# Patient Record
Sex: Female | Born: 1945 | Race: Black or African American | Hispanic: No | Smoking: Former smoker
Health system: Southern US, Community
[De-identification: ages and names within clinical notes are randomized; demographics above are authoritative.]

## PROBLEM LIST (undated history)

## (undated) DIAGNOSIS — E1142 Type 2 diabetes mellitus with diabetic polyneuropathy: Secondary | ICD-10-CM

## (undated) DIAGNOSIS — R4182 Altered mental status, unspecified: Secondary | ICD-10-CM

## (undated) DIAGNOSIS — D649 Anemia, unspecified: Secondary | ICD-10-CM

## (undated) DIAGNOSIS — D573 Sickle-cell trait: Secondary | ICD-10-CM

## (undated) DIAGNOSIS — R1319 Other dysphagia: Secondary | ICD-10-CM

## (undated) DIAGNOSIS — N189 Chronic kidney disease, unspecified: Secondary | ICD-10-CM

## (undated) DIAGNOSIS — D571 Sickle-cell disease without crisis: Secondary | ICD-10-CM

## (undated) DIAGNOSIS — D561 Beta thalassemia: Secondary | ICD-10-CM

## (undated) DIAGNOSIS — I10 Essential (primary) hypertension: Secondary | ICD-10-CM

## (undated) DIAGNOSIS — E785 Hyperlipidemia, unspecified: Secondary | ICD-10-CM

## (undated) DIAGNOSIS — E639 Nutritional deficiency, unspecified: Secondary | ICD-10-CM

## (undated) HISTORY — DX: Hyperlipidemia, unspecified: E78.5

## (undated) HISTORY — DX: Anemia, unspecified: D64.9

## (undated) HISTORY — DX: Altered mental status, unspecified: R41.82

## (undated) HISTORY — DX: Chronic kidney disease, unspecified: N18.9

## (undated) HISTORY — DX: Essential (primary) hypertension: I10

## (undated) HISTORY — DX: Nutritional deficiency, unspecified: E63.9

## (undated) HISTORY — DX: Type 2 diabetes mellitus with diabetic polyneuropathy: E11.42

## (undated) HISTORY — DX: Sickle-cell disease without crisis: D57.1

## (undated) HISTORY — PX: OTHER SURGICAL HISTORY: SHX169

## (undated) HISTORY — DX: Other dysphagia: R13.19

---

## 1977-04-28 HISTORY — PX: TUBAL LIGATION: SHX77

## 1998-04-05 ENCOUNTER — Encounter: Admission: RE | Admit: 1998-04-05 | Discharge: 1998-07-04 | Payer: Self-pay | Admitting: Pulmonary Disease

## 1998-04-05 ENCOUNTER — Ambulatory Visit (HOSPITAL_COMMUNITY): Admission: RE | Admit: 1998-04-05 | Discharge: 1998-04-05 | Payer: Self-pay | Admitting: Pulmonary Disease

## 1998-04-05 ENCOUNTER — Encounter: Payer: Self-pay | Admitting: Pulmonary Disease

## 1999-05-08 ENCOUNTER — Encounter: Admission: RE | Admit: 1999-05-08 | Discharge: 1999-08-06 | Payer: Self-pay | Admitting: Pulmonary Disease

## 2000-07-22 ENCOUNTER — Encounter: Payer: Self-pay | Admitting: Pulmonary Disease

## 2000-07-22 ENCOUNTER — Ambulatory Visit (HOSPITAL_COMMUNITY): Admission: RE | Admit: 2000-07-22 | Discharge: 2000-07-22 | Payer: Self-pay | Admitting: Pulmonary Disease

## 2000-11-04 ENCOUNTER — Encounter: Admission: RE | Admit: 2000-11-04 | Discharge: 2001-02-02 | Payer: Self-pay | Admitting: Internal Medicine

## 2001-01-12 ENCOUNTER — Encounter: Payer: Self-pay | Admitting: Internal Medicine

## 2001-01-12 ENCOUNTER — Encounter: Admission: RE | Admit: 2001-01-12 | Discharge: 2001-01-12 | Payer: Self-pay | Admitting: Internal Medicine

## 2001-01-27 ENCOUNTER — Encounter: Payer: Self-pay | Admitting: Internal Medicine

## 2001-01-27 ENCOUNTER — Encounter: Admission: RE | Admit: 2001-01-27 | Discharge: 2001-01-27 | Payer: Self-pay | Admitting: Internal Medicine

## 2001-02-24 ENCOUNTER — Observation Stay (HOSPITAL_COMMUNITY): Admission: EM | Admit: 2001-02-24 | Discharge: 2001-02-25 | Payer: Self-pay | Admitting: *Deleted

## 2001-04-15 ENCOUNTER — Other Ambulatory Visit: Admission: RE | Admit: 2001-04-15 | Discharge: 2001-04-15 | Payer: Self-pay | Admitting: Internal Medicine

## 2001-12-06 ENCOUNTER — Encounter: Admission: RE | Admit: 2001-12-06 | Discharge: 2001-12-06 | Payer: Self-pay | Admitting: Internal Medicine

## 2001-12-06 ENCOUNTER — Encounter: Payer: Self-pay | Admitting: Internal Medicine

## 2003-04-17 ENCOUNTER — Encounter (INDEPENDENT_AMBULATORY_CARE_PROVIDER_SITE_OTHER): Payer: Self-pay | Admitting: *Deleted

## 2003-04-17 ENCOUNTER — Ambulatory Visit (HOSPITAL_COMMUNITY): Admission: RE | Admit: 2003-04-17 | Discharge: 2003-04-17 | Payer: Self-pay | Admitting: Gastroenterology

## 2005-03-04 ENCOUNTER — Emergency Department (HOSPITAL_COMMUNITY): Admission: EM | Admit: 2005-03-04 | Discharge: 2005-03-04 | Payer: Self-pay | Admitting: *Deleted

## 2005-04-14 ENCOUNTER — Encounter: Admission: RE | Admit: 2005-04-14 | Discharge: 2005-04-14 | Payer: Self-pay | Admitting: Orthopedic Surgery

## 2005-06-21 ENCOUNTER — Emergency Department (HOSPITAL_COMMUNITY): Admission: EM | Admit: 2005-06-21 | Discharge: 2005-06-21 | Payer: Self-pay | Admitting: Emergency Medicine

## 2005-09-19 ENCOUNTER — Other Ambulatory Visit: Admission: RE | Admit: 2005-09-19 | Discharge: 2005-09-19 | Payer: Self-pay | Admitting: Obstetrics and Gynecology

## 2005-10-14 ENCOUNTER — Encounter: Admission: RE | Admit: 2005-10-14 | Discharge: 2005-10-14 | Payer: Self-pay | Admitting: Internal Medicine

## 2005-11-02 ENCOUNTER — Inpatient Hospital Stay (HOSPITAL_COMMUNITY): Admission: EM | Admit: 2005-11-02 | Discharge: 2005-11-04 | Payer: Self-pay | Admitting: Emergency Medicine

## 2006-01-02 ENCOUNTER — Encounter: Admission: RE | Admit: 2006-01-02 | Discharge: 2006-01-02 | Payer: Self-pay | Admitting: Internal Medicine

## 2006-01-04 HISTORY — PX: BONE RESECTION: SHX897

## 2006-01-30 ENCOUNTER — Emergency Department (HOSPITAL_COMMUNITY): Admission: EM | Admit: 2006-01-30 | Discharge: 2006-01-30 | Payer: Self-pay | Admitting: Emergency Medicine

## 2006-11-25 ENCOUNTER — Encounter: Admission: RE | Admit: 2006-11-25 | Discharge: 2006-11-25 | Payer: Self-pay | Admitting: Obstetrics and Gynecology

## 2006-12-08 ENCOUNTER — Encounter: Admission: RE | Admit: 2006-12-08 | Discharge: 2006-12-08 | Payer: Self-pay | Admitting: Obstetrics and Gynecology

## 2007-09-28 ENCOUNTER — Encounter: Admission: RE | Admit: 2007-09-28 | Discharge: 2007-09-28 | Payer: Self-pay | Admitting: Internal Medicine

## 2009-03-15 ENCOUNTER — Encounter: Admission: RE | Admit: 2009-03-15 | Discharge: 2009-06-13 | Payer: Self-pay | Admitting: Surgery

## 2009-03-16 ENCOUNTER — Ambulatory Visit (HOSPITAL_COMMUNITY): Admission: RE | Admit: 2009-03-16 | Discharge: 2009-03-16 | Payer: Self-pay | Admitting: Surgery

## 2009-03-19 ENCOUNTER — Ambulatory Visit (HOSPITAL_COMMUNITY): Admission: RE | Admit: 2009-03-19 | Discharge: 2009-03-19 | Payer: Self-pay | Admitting: Surgery

## 2009-06-14 ENCOUNTER — Encounter: Admission: RE | Admit: 2009-06-14 | Discharge: 2009-07-04 | Payer: Self-pay | Admitting: Surgery

## 2009-07-20 ENCOUNTER — Encounter: Admission: RE | Admit: 2009-07-20 | Discharge: 2009-10-18 | Payer: Self-pay | Admitting: Surgery

## 2009-08-14 ENCOUNTER — Inpatient Hospital Stay (HOSPITAL_COMMUNITY): Admission: RE | Admit: 2009-08-14 | Discharge: 2009-08-17 | Payer: Self-pay | Admitting: Surgery

## 2009-08-14 DIAGNOSIS — Z9884 Bariatric surgery status: Secondary | ICD-10-CM

## 2009-08-14 HISTORY — DX: Bariatric surgery status: Z98.84

## 2009-08-14 HISTORY — PX: GASTRIC BYPASS: SHX52

## 2009-08-15 ENCOUNTER — Encounter (INDEPENDENT_AMBULATORY_CARE_PROVIDER_SITE_OTHER): Payer: Self-pay | Admitting: General Surgery

## 2009-08-15 ENCOUNTER — Ambulatory Visit: Payer: Self-pay | Admitting: Vascular Surgery

## 2009-10-19 ENCOUNTER — Encounter: Admission: RE | Admit: 2009-10-19 | Discharge: 2009-10-19 | Payer: Self-pay | Admitting: Internal Medicine

## 2009-11-15 ENCOUNTER — Encounter: Admission: RE | Admit: 2009-11-15 | Discharge: 2009-11-15 | Payer: Self-pay | Admitting: Surgery

## 2010-06-12 ENCOUNTER — Encounter
Admission: RE | Admit: 2010-06-12 | Discharge: 2010-06-12 | Payer: Self-pay | Source: Home / Self Care | Admitting: Obstetrics and Gynecology

## 2010-07-28 ENCOUNTER — Encounter: Payer: Self-pay | Admitting: Obstetrics and Gynecology

## 2010-07-29 ENCOUNTER — Encounter: Payer: Self-pay | Admitting: Internal Medicine

## 2010-07-29 ENCOUNTER — Encounter: Payer: Self-pay | Admitting: Surgery

## 2010-09-25 LAB — GLUCOSE, CAPILLARY
Glucose-Capillary: 104 mg/dL — ABNORMAL HIGH (ref 70–99)
Glucose-Capillary: 104 mg/dL — ABNORMAL HIGH (ref 70–99)
Glucose-Capillary: 107 mg/dL — ABNORMAL HIGH (ref 70–99)
Glucose-Capillary: 109 mg/dL — ABNORMAL HIGH (ref 70–99)
Glucose-Capillary: 122 mg/dL — ABNORMAL HIGH (ref 70–99)
Glucose-Capillary: 129 mg/dL — ABNORMAL HIGH (ref 70–99)
Glucose-Capillary: 130 mg/dL — ABNORMAL HIGH (ref 70–99)
Glucose-Capillary: 140 mg/dL — ABNORMAL HIGH (ref 70–99)
Glucose-Capillary: 140 mg/dL — ABNORMAL HIGH (ref 70–99)
Glucose-Capillary: 140 mg/dL — ABNORMAL HIGH (ref 70–99)
Glucose-Capillary: 145 mg/dL — ABNORMAL HIGH (ref 70–99)
Glucose-Capillary: 177 mg/dL — ABNORMAL HIGH (ref 70–99)
Glucose-Capillary: 179 mg/dL — ABNORMAL HIGH (ref 70–99)
Glucose-Capillary: 179 mg/dL — ABNORMAL HIGH (ref 70–99)
Glucose-Capillary: 79 mg/dL (ref 70–99)
Glucose-Capillary: 86 mg/dL (ref 70–99)
Glucose-Capillary: 88 mg/dL (ref 70–99)
Glucose-Capillary: 88 mg/dL (ref 70–99)
Glucose-Capillary: 92 mg/dL (ref 70–99)
Glucose-Capillary: 97 mg/dL (ref 70–99)
Glucose-Capillary: 98 mg/dL (ref 70–99)
Glucose-Capillary: 98 mg/dL (ref 70–99)
Glucose-Capillary: 98 mg/dL (ref 70–99)

## 2010-09-25 LAB — CBC
HCT: 23.1 % — ABNORMAL LOW (ref 36.0–46.0)
HCT: 23.1 % — ABNORMAL LOW (ref 36.0–46.0)
HCT: 26.9 % — ABNORMAL LOW (ref 36.0–46.0)
HCT: 31.3 % — ABNORMAL LOW (ref 36.0–46.0)
Hemoglobin: 10 g/dL — ABNORMAL LOW (ref 12.0–15.0)
Hemoglobin: 7.6 g/dL — ABNORMAL LOW (ref 12.0–15.0)
Hemoglobin: 7.7 g/dL — ABNORMAL LOW (ref 12.0–15.0)
Hemoglobin: 8.8 g/dL — ABNORMAL LOW (ref 12.0–15.0)
MCHC: 31.9 g/dL (ref 30.0–36.0)
MCHC: 32.5 g/dL (ref 30.0–36.0)
MCHC: 32.8 g/dL (ref 30.0–36.0)
MCHC: 33.3 g/dL (ref 30.0–36.0)
MCV: 73 fL — ABNORMAL LOW (ref 78.0–100.0)
MCV: 73.2 fL — ABNORMAL LOW (ref 78.0–100.0)
MCV: 73.2 fL — ABNORMAL LOW (ref 78.0–100.0)
MCV: 73.7 fL — ABNORMAL LOW (ref 78.0–100.0)
Platelets: 181 10*3/uL (ref 150–400)
Platelets: 185 10*3/uL (ref 150–400)
Platelets: 214 10*3/uL (ref 150–400)
Platelets: 277 10*3/uL (ref 150–400)
RBC: 3.15 MIL/uL — ABNORMAL LOW (ref 3.87–5.11)
RBC: 3.16 MIL/uL — ABNORMAL LOW (ref 3.87–5.11)
RBC: 3.65 MIL/uL — ABNORMAL LOW (ref 3.87–5.11)
RBC: 4.28 MIL/uL (ref 3.87–5.11)
RDW: 16.1 % — ABNORMAL HIGH (ref 11.5–15.5)
RDW: 16.4 % — ABNORMAL HIGH (ref 11.5–15.5)
RDW: 16.5 % — ABNORMAL HIGH (ref 11.5–15.5)
RDW: 16.7 % — ABNORMAL HIGH (ref 11.5–15.5)
WBC: 10.5 10*3/uL (ref 4.0–10.5)
WBC: 10.7 10*3/uL — ABNORMAL HIGH (ref 4.0–10.5)
WBC: 8.5 10*3/uL (ref 4.0–10.5)
WBC: 9.4 10*3/uL (ref 4.0–10.5)

## 2010-09-25 LAB — DIFFERENTIAL
Basophils Absolute: 0 10*3/uL (ref 0.0–0.1)
Basophils Absolute: 0 10*3/uL (ref 0.0–0.1)
Basophils Absolute: 0 10*3/uL (ref 0.0–0.1)
Basophils Absolute: 0 10*3/uL (ref 0.0–0.1)
Basophils Relative: 0 % (ref 0–1)
Basophils Relative: 0 % (ref 0–1)
Basophils Relative: 0 % (ref 0–1)
Basophils Relative: 0 % (ref 0–1)
Eosinophils Absolute: 0 10*3/uL (ref 0.0–0.7)
Eosinophils Absolute: 0.1 10*3/uL (ref 0.0–0.7)
Eosinophils Absolute: 0.1 10*3/uL (ref 0.0–0.7)
Eosinophils Absolute: 0.1 10*3/uL (ref 0.0–0.7)
Eosinophils Relative: 0 % (ref 0–5)
Eosinophils Relative: 1 % (ref 0–5)
Eosinophils Relative: 1 % (ref 0–5)
Eosinophils Relative: 2 % (ref 0–5)
Lymphocytes Relative: 11 % — ABNORMAL LOW (ref 12–46)
Lymphocytes Relative: 14 % (ref 12–46)
Lymphocytes Relative: 17 % (ref 12–46)
Lymphocytes Relative: 21 % (ref 12–46)
Lymphs Abs: 1.2 10*3/uL (ref 0.7–4.0)
Lymphs Abs: 1.3 10*3/uL (ref 0.7–4.0)
Lymphs Abs: 1.5 10*3/uL (ref 0.7–4.0)
Lymphs Abs: 2.2 10*3/uL (ref 0.7–4.0)
Monocytes Absolute: 0.6 10*3/uL (ref 0.1–1.0)
Monocytes Absolute: 0.6 10*3/uL (ref 0.1–1.0)
Monocytes Absolute: 0.6 10*3/uL (ref 0.1–1.0)
Monocytes Absolute: 0.6 10*3/uL (ref 0.1–1.0)
Monocytes Relative: 6 % (ref 3–12)
Monocytes Relative: 6 % (ref 3–12)
Monocytes Relative: 7 % (ref 3–12)
Monocytes Relative: 7 % (ref 3–12)
Neutro Abs: 6.3 10*3/uL (ref 1.7–7.7)
Neutro Abs: 7.4 10*3/uL (ref 1.7–7.7)
Neutro Abs: 7.5 10*3/uL (ref 1.7–7.7)
Neutro Abs: 8.9 10*3/uL — ABNORMAL HIGH (ref 1.7–7.7)
Neutrophils Relative %: 72 % (ref 43–77)
Neutrophils Relative %: 74 % (ref 43–77)
Neutrophils Relative %: 79 % — ABNORMAL HIGH (ref 43–77)
Neutrophils Relative %: 83 % — ABNORMAL HIGH (ref 43–77)

## 2010-09-25 LAB — TYPE AND SCREEN
ABO/RH(D): A POS
Antibody Screen: NEGATIVE

## 2010-09-25 LAB — HEMOGLOBIN AND HEMATOCRIT, BLOOD
HCT: 25.3 % — ABNORMAL LOW (ref 36.0–46.0)
HCT: 29.5 % — ABNORMAL LOW (ref 36.0–46.0)
Hemoglobin: 8.2 g/dL — ABNORMAL LOW (ref 12.0–15.0)
Hemoglobin: 9.6 g/dL — ABNORMAL LOW (ref 12.0–15.0)

## 2010-09-25 LAB — COMPREHENSIVE METABOLIC PANEL
ALT: 45 U/L — ABNORMAL HIGH (ref 0–35)
AST: 35 U/L (ref 0–37)
Albumin: 3.7 g/dL (ref 3.5–5.2)
Alkaline Phosphatase: 35 U/L — ABNORMAL LOW (ref 39–117)
BUN: 28 mg/dL — ABNORMAL HIGH (ref 6–23)
CO2: 28 mEq/L (ref 19–32)
Calcium: 9.7 mg/dL (ref 8.4–10.5)
Chloride: 103 mEq/L (ref 96–112)
Creatinine, Ser: 1.35 mg/dL — ABNORMAL HIGH (ref 0.4–1.2)
GFR calc Af Amer: 48 mL/min — ABNORMAL LOW (ref >60)
GFR calc non Af Amer: 40 mL/min — ABNORMAL LOW (ref >60)
Glucose, Bld: 116 mg/dL — ABNORMAL HIGH (ref 70–99)
Potassium: 3.8 mEq/L (ref 3.5–5.1)
Sodium: 140 mEq/L (ref 135–145)
Total Bilirubin: 0.9 mg/dL (ref 0.3–1.2)
Total Protein: 7.2 g/dL (ref 6.0–8.3)

## 2010-09-25 LAB — ABO/RH: ABO/RH(D): A POS

## 2010-11-22 NOTE — Discharge Summary (Signed)
NAMEMarland Roman  Miranda, Roman NO.:  [id number]   MEDICAL RECORD NO.:  [id number]          PATIENT TYPE:  INP   LOCATION:  2013                         FACILITY:  [facility]   PHYSICIAN:  Shelva Majestic, M.D.     DATE OF BIRTH:  Dec 07, 1945   DATE OF ADMISSION:  11/02/2005  DATE OF DISCHARGE:  11/04/2005                                 DISCHARGE SUMMARY   [name] is a patient of Dr. Baird Cancer and Dr. Melvern Banker.  She is 65 years  old, African-American, obese, diabetic, with history of normal coronaries in  1998 and August 2002.  She came into the emergency room with midsternal  chest pain.  States it had never been so severe before, midsternal,  radiating to across her chest, worse with movement.  She does have DJD and  has recently had some problems with her cervical spine, is being seen by  Doran Heater. Veverly Fells, M.D.  Currently out of work secondary to her cervical neck  problems.  She has multiple cardiac risk factors, including IDDM, obesity,  hyperlipidemia, and hypertension.  She was seen by Dr. Ilene Prows, who though  it may be musculoskeletal.  It was decided to admit her to rule out for an  MI.  Her CK, MB and troponins came back negative.  She was still having some  recurrent discomfort.  She was given the option of cardiac catheterization  or a Cardiolite secondary to her multiple risk factors, plus she has a very  large chest size and a Cardiolite test would probably be indeterminate.  She  decided to undergo cardiac catheterization.  Thus, on Nov 04, 2005, this was  performed by Shelva Majestic, M.D.  She had normal coronary arteries, minimal  10-20% in the circumflex.  Her EF was greater than 65%.  She had no renal  disease and no aortoiliac disease.  It was decided that she could go home  post procedure.  Blood pressure was 132/81, pulse was 86, respirations 18,  O2 saturations were 94%.  She states that her pain was now around to her  left posterior thoracic area.  Her chest  x-ray showed no acute findings.  Labs shows a sodium of 139, potassium 4.0, chloride 105, carbon dioxide 25,  glucose 189, BUN 11, creatinine 1.0.  Hemoglobin 9.8, hematocrit 29.4, WBC  8.9, platelets 221.  Total cholesterol was 143, triglycerides 100, HDL 37,  LDL 86.  Her D-dimer was mildly elevated at __________ and not thought to be  significant.  TSH was 1.140.   DISCHARGE MEDICATIONS:  1.  Aspirin 81 mg one time per day.  2.  Protonix 40 mg one time per day.  3.  Lipitor 20 mg one time per day.  4.  Humulin 70/30 35 units in the a.m., 10 units in the p.m.  5.  Accuretic 20 mg one time per day.  6.  She should continue her Vicodin and muscle relaxants as ordered by her      orthopedic doctor.   She will follow up with Dr. Melvern Banker on May 7 at 10 a.m.  She should  follow up  with Dr. Baird Cancer for further workup of questionable new anemia and for Dr.  Collene Mares for gastrointestinal complaints.   DISCHARGE DIAGNOSES:  1.  Chest pain, not ischemic-related, with normal coronary arteries,      probably musculoskeletal or gastrointestinal-related.  2.  Insulin-dependent diabetes mellitus.  3.  Hypertension.  4.  Hyperlipidemia.  5.  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  6.  Anemia.  7.  History of sickle cell trait.  8.  Obesity.  9.  History of gastroesophageal reflux disease.  10. History of status post surgery for right frozen shoulder.  11. History of tubal ligation.      Cyndia Bent, N.P.    ______________________________  Shelva Majestic, M.D.    BB/MEDQ  D:  11/04/2005  T:  11/05/2005  Job:  [id number]

## 2010-11-22 NOTE — Op Note (Signed)
   NAME:  Miranda Roman, Miranda Roman                        ACCOUNT NO.:  [id number]   MEDICAL RECORD NO.:  [id number]                   PATIENT TYPE:  AMB   LOCATION:  ENDO                                 FACILITY:  [facility]   PHYSICIAN:  Nelwyn Salisbury, M.D.               DATE OF BIRTH:  [birth year]   DATE OF PROCEDURE:  04/17/2003  DATE OF DISCHARGE:                                 OPERATIVE REPORT   PROCEDURE PERFORMED:  Colonoscopy with cold biopsies x2.   ENDOSCOPIST:  Nelwyn Salisbury, M.D.   INSTRUMENT USED:  Olympus video colonoscope.   INDICATION FOR PROCEDURE:  61 African American female with  a history of iron deficiency anemia and a family history of colon cancer,  rule out colonic polyps, masses, etc.   PREPROCEDURE PREPARATION:  Informed consent was procured from the patient.  The patient was fasted for eight hours prior to the procedure and prepped  with a bottle of magnesium citrate and a gallon of GoLYTELY the night prior  to the procedure.   PREPROCEDURE PHYSICAL:  VITAL SIGNS:  The patient had stable vital signs.  NECK:  Neck supple.  CHEST:  Chest clear to auscultation.  S1 and S2 regular.  ABDOMEN:  Abdomen soft with normal bowel sounds.   DESCRIPTION OF THE PROCEDURE:  The patient was placed in the left lateral  decubitus position and sedated with an additional 20 mg of Demerol.  Once  the patient was adequately sedate and maintained on low-flow oxygen and  continuous cardiac monitoring, the Olympus video colonoscope was advanced  from the rectum to the cecum and terminal ileum.  Two small polyps were  biopsies from 30 cm.  No masses, polyps, erosions or ulcerations were seen.  A few right-sided small diverticula were seen.  The appendiceal orifice and  ileocecal valve were clearly visualized and photographed.  Retroflexion in  the rectum revealed small internal hemorrhoids.  No large masses or polyps  were present.   IMPRESSION:  1. Small  non-bleeding internal hemorrhoid.  2. Two small sessile polyps biopsied at 30 cm.  3. A few early left-sided diverticula.  4. Normal terminal ileum.    RECOMMENDATIONS:  1. Await pathology results.  2. Outpatient followup in the next two weeks for further recommendations.                                               Nelwyn Salisbury, M.D.    JNM/MEDQ  D:  04/17/2003  T:  04/17/2003  Job:  [id number]   cc:   Theda Belfast. Baird Cancer, M.D.  [address]  [address] [place] [address]  Fax: [phone]

## 2010-11-22 NOTE — Discharge Summary (Signed)
[place]. [facility]  Patient:    Miranda Roman, Miranda Roman Visit Number: [id number] MRN: [id number]          Service Type: MED Location: [phone] Attending Physician:  Camelia Phenes Dictated by:   [facility] Adm. Date:  02/24/2001 Disc. Date: 02/25/2001                             Discharge Summary  NO DICTATION Dictated by:   [facility] [place], [place]. Attending Physician:  Camelia Phenes DD:  02/25/01 TD:  02/26/01 Job: [id number]

## 2010-11-22 NOTE — Cardiovascular Report (Signed)
[place]. [facility]  Patient:    Miranda Roman, Miranda Roman Visit Number: [id number] MRN: [id number]          Service Type: MED Location: [phone] Attending Physician:  Camelia Phenes Proc. Date: 02/24/01 Adm. Date:  02/24/2001   CC:         Cardiac Catheterization Laboratory  Glendale Chard, M.D., [facility]  [place], [employer] [address] [place], [place] [address]   Cardiac Catheterization  PROCEDURES PERFORMED:  Cardiac catheterization.  INDICATIONS:  The patient is a 65 year old, moderately overweight, black female, with positive cardiac risk factors and normal catheterization four years ago, presented with several hours of substernal chest pressure, who had had ECG changes.  She presents now for diagnostic coronary arteriography.  DESCRIPTION OF PROCEDURE:  The patient was brought to the second floor [facility] in the postabsorptive state.  She was premedicated with p.o. Valium.  Her right groin was prepped and shaved in the usual sterile fashion.  Xylocaine 1% was used for local anesthesia.  A 6 French sheath was inserted into the right femoral artery using standard Seldinger technique.  A 6 French right and left Judkins catheter as well as a 6 French pigtail catheter were used for selective coronary angiography, left ventriculography, supravalvular aortography.  Omnipaque dye was used for the entirety of the case.  Retrograde, aortic, left ventricular, and pullback pressures were recorded.  HEMODYNAMICS: 1. Aortic systolic pressure 750, diastolic pressure 79. 2. Left ventricular systolic pressure 518, diastolic pressure 20.  SELECTIVE CORONARY ANGIOGRAPHY: 1. Left main:  Normal. 2. LAD:  Normal. 3. Left circumflex:  Normal. 4. Right coronary artery:  Dominant and normal.  LEFT VENTRICULOGRAPHY:  The RAO left ventriculogram was performed using 25 cc of Omnipaque  dye at 12 cc/sec.  The overall LVEF was estimated at greater than 60% without focal wall motion abnormalities.  SUPRAVALVULAR AORTOGRAPHY:  Supravalvular aortogram was performed using 30 cc of Omnipaque dye at 20 cc/sec. in the LAO cranial view.  There is no AI noted. There is no dissection.  All arch vessels appear to be intact.  IMPRESSION:  The patient has normal coronary arteries and normal left ventricular function.  I believe her chest pain is noncardiac, most likely related to reflux.  The ACT was measured and the sheaths were removed.  Pressure was held on the groin to achieve hemostasis.  The patient will be discharged home in the morning on a proton pump inhibitor for empiric antireflux prophylaxis and will see Dr. Glendale Chard back in followup.  She left the lab in stable condition. ttending Physician:  Camelia Phenes DD:  02/24/01 TD:  02/25/01 Job: [id number]

## 2010-11-22 NOTE — Cardiovascular Report (Signed)
NAMEMarland Kitchen  ALBANA, SAPERSTEIN NO.:  [id number]   MEDICAL RECORD NO.:  [id number]          PATIENT TYPE:  INP   LOCATION:  2013                         FACILITY:  [facility]   PHYSICIAN:  Shelva Majestic, M.D.     DATE OF BIRTH:  01/04/1946   DATE OF PROCEDURE:  11/04/2005  DATE OF DISCHARGE:                              CARDIAC CATHETERIZATION   INDICATIONS:  Ms. Miranda Roman is a 65 year old African-American female  who has a history of sickle cell trait, type 2 diabetes mellitus, obesity,  and hyperlipidemia.  The patient has a history of documented normal coronary  arteries remotely with last catheterization in 2002.  She was admitted via  [facility] emergency room yesterday with midsternal chest pressure.  She was  seen by Dr. Einar Gip.  In light of risk factors, definitive diagnostic cardiac  catheterization was recommended.   PROCEDURE:  After premedication with Valium 5 mg intravenously, the patient  was prepped and draped in the usual fashion.  Her right femoral artery was  punctured anteriorly and a 5-French sheath was inserted.  Diagnostic  catheterization was done with a 5-French Judkins 4 left and right coronary  catheters.  A 5-French pigtail catheter was used for left ventriculography.  Distal aortography was performed because of the patient's hypertensive  history to make sure there was no renal vascular etiology.  Hemostasis was  obtained by direct manual pressure.   HEMODYNAMIC DATA:  Initially, centrally aortic pressure was 132/82.  On  pullback, left ventricular pressure was 172/27 and central aortic pressure  was 172/86.   ANGIOGRAPHIC DATA:  The left main coronary artery was angiographically  normal and bifurcated into an LAD and left circumflex system.   The LAD was angiographically normal, gave rise to two proximal diagonal  vessels, several septal perforating arteries.  The mid portion of the LAD  seemed to dip intramyocardially, but there did not  appear to be any evidence  for systolic bridging.  The LAD wrapped around the LV apex.   The circumflex vessel was a moderate-sized vessel that had smooth 10% to  less than 20% proximal narrowing near the ostium.  This gave rise to one  major bifurcating marginal vessel and AV groove circumflex system which was  otherwise free of significant disease.   The right coronary artery was a normal vessel which immediately gave rise to  a conus branch and ended in a small PDA/PLA system.   RAO ventriculography revealed normal LV contractility with an ejection  fraction of approximately 65%.  There were no focal segmental wall motion  abnormalities.   Distal aortography did not reveal any evidence for renal artery stenosis.  There was no significant aortoiliac disease.   IMPRESSION:  1.  Normal left ventricular function.  2.  Essentially normal coronary arteries with only smooth 10% to less than      20% narrowing in the ostium of the left circumflex coronary artery.      Suspect noncardiac etiology to the patient's recent chest pain.           ______________________________  Shelva Majestic, M.D.  TK/MEDQ  D:  11/04/2005  T:  11/04/2005  Job:  [id number]

## 2010-11-22 NOTE — Discharge Summary (Signed)
[place]. [facility]  Patient:    Miranda Roman, Miranda Roman Visit Number: [id number] MRN: [id number]          Service Type: MED Location: [phone] Attending Physician:  Camelia Phenes Dictated by:   [facility] Adm. Date:  02/24/2001 Disc. Date: 02/25/2001   CC:         Bryon Lions, M.D.   Discharge Summary  ADMITTING DIAGNOSES: 1. Chest pain, questionable etiology. 2. Non-insulin-dependent diabetes mellitus requiring insulin. 3. Hypertension. 4. Hyperlipidemia. 5. Tobacco use. 6. Family history of coronary artery disease.  DISCHARGE DIAGNOSES: 1. Chest pain, questionable etiology. 2. Non-insulin-dependent diabetes mellitus requiring insulin. 3. Hypertension. 4. Hyperlipidemia. 5. Tobacco use. 6. Family history of coronary artery disease. 7. Anemia with negative heme for rectal exam.  Laboratory ordering and results    pending and will need to be followed up with Bryon Lions, M.D.  HISTORY OF PRESENT ILLNESS:  Miranda Roman is a 65 year old African-American female with a history of cardiac catheterization in 1998 which revealed patent coronary arteries.  She also has a history of insulin-dependent diabetes mellitus, hypertension, hyperlipidemia, tobacco use, and also a family history of cardiovascular disease.  She presented to [facility] Emergency Room on February 24, 2001, with complaints of chest pain.  At that time she stated that on the previous evening at approximately 7 p.m. she was sitting at her desk at work when she developed the onset of pain under her left breast that was described as a burning sensation.  As well she has quick fleeting pains several times and then it resolved.  They did not return until approximately 4:30 in the morning that night, at which time she woke up and had tightness in her chest under her left breast.  As well it went into her left shoulder.  There was no associated shortness of  breath, diaphoresis, nausea or vomiting.  She took no medications to attempt to relieve it.  She called her sister to come pick her up to bring her to the emergency room.  The tightness was present for approximately one hour.  She did gain relief after she got to the emergency room, but states she on interview the pain had not returned.  As well she had not had any similar discomfort over the past week.  The most physically exertional thing that she done over the last couple weeks was to help her sister move and she had been moving boxes.  She said she had had no shortness of breath or chest pain at that point.  EKG showed normal sinus rhythm, 85 beats per minute, no ST-T change.  There was no significant abnormalities on her physical exam, and she was hemodynamically stable with heart of 79, blood pressure 134/66.  Her first set of cardiac enzymes were negative.  However, given her significant risk factor profile and worrisome chest tightness, we felt that she should undergo repeat cardiac catheterization.  She was placed on aspirin, Plavix, heparin, and planned for cardiac catheterization.  HOSPITAL COURSE:  On February 24, 2001, Miranda Roman underwent cardiac catheterization by Lorretta Harp, M.D.  This revealed normal coronary arteries, normal LV function, and normal aortic root with no dissection.  He planned for empiric therapy with proton-pump inhibitor and planned for discharge home in the morning.  On February 25, 2001, Miranda Roman stated she had recurrent burning and tightness in her chest during the night.  At this point she is afebrile at  97.4, pulse 85, blood pressure 137/75, and oxygen saturation 97% on room air.  Her renal function is stable post-catheterization and creatinine is 0.9.  However, she is anemic with a hemoglobin of 9.7, hematocrit 29.1.  Her right groin is well-healed with no hematoma, no bleed.  At this time a rectal exam is performed given her low  hemoglobin.  It is negative for any occult blood.  At this time we are having labs ordered to check sickle cell screen, iron profile, folate, and B12.  As well we will check a hemoglobin and electrophoresis to rule out thalassemia.  These laboratories will be drawn prior to her discharge home, but the results will need The results will need to be followed up with Bryon Lions, M.D. as an outpatient.  HOSPITAL CONSULTS:  None.  HOSPITAL PROCEDURES:  Cardiac catheterization on February 24, 2001, by Lorretta Harp, M.D. which revealed normal coronary arteries, normal LV function, and normal aortic root without dissection.  LABORATORY:  Cardiac enzymes negative from two sets.  All the results are not printed out at this time.  However, the first set CK was 85, MB 0.6, and troponin 0.01.  On February 24, 2001, CBC showed white blood cell count 6.9, RBC 3.79, hemoglobin 9.3, hematocrit 27.9, MCV 73.5, MCHC 33.3, RDW 16.1, and platelet count 220.  On February 25, 2001, white blood cell count 6.1, hemoglobin 9.7, hematocrit 29.1, and platelet 187.  On February 25, 2001, sodium 137, potassium 4.8, BUN 10, creatinine 0.9, and glucose 109.  On February 24, 2001, sodium 139, potassium 3.8, chloride 109, CO2 23, glucose 141, BUN 9, creatinine 0.9.  On February 24, 2001, PT 13.5, INR 1.0, PTT 27.  Urinalysis was negative, except urobilinogen was high at 1.0.  Everything else was normal.  X-RAYS:  Chest x-ray February 24, 2001, showed no evidence of acute cardiopulmonary process.  EKG on February 24, 2001, showed normal sinus rhythm, 85 beats per minute, nonspecific ST-T change.  DISCHARGE MEDICATIONS: 1. Nexium 40 mg 1 p.o. q.d. 2. Insulin 70/30 35 units in the morning and 20 units in the evening. 3. She is on a lipid study and she is to continue that medication as    instructed. 4. Actos 30 mg q.h.s. 5. _____ as before.  ACTIVITY:  No strenuous activity, lifting greater than five pounds, driving, or  sexual activity for three days.   DIET:  Low-salt/low-fat/low-cholesterol diet.  WOUND CARE:  May gently wash her groin with warm water and soap.  INSTRUCTIONS:  Call the office at [phone] if any bleeding or increase size or pain in groin.  FOLLOWUP:  She is to call today to make an appointment to see Dr. Baird Cancer in one to two weeks.  She is to make sure that when she sees Dr. Baird Cancer back that she reviews the blood work that has been ordered today to further evaluate her anemia.  As well, she may need GI evaluation given the burning discomfort that she is experiencing and the fact that she had no CAD to explain her chest pain.  She fully understands and agrees to make sure that Dr. Baird Cancer gets the results of the lab work and reviews it with her. Dictated by:   [facility] [place], [place]. Attending Physician:  Camelia Phenes DD:  02/25/01 TD:  02/26/01 Job: [id number]

## 2010-11-22 NOTE — Op Note (Signed)
NAME:  Miranda Roman, Miranda Roman                        ACCOUNT NO.:  [id number]   MEDICAL RECORD NO.:  [id number]                   PATIENT TYPE:  AMB   LOCATION:  ENDO                                 FACILITY:  [facility]   PHYSICIAN:  Nelwyn Salisbury, M.D.               DATE OF BIRTH:  [birth year]   DATE OF PROCEDURE:  04/17/2003  DATE OF DISCHARGE:                                 OPERATIVE REPORT   PROCEDURE PERFORMED:  Esophagogastroduodenoscopy with biopsies.   ENDOSCOPIST:  Nelwyn Salisbury, M.D.   INSTRUMENT USED:  Olympus video panendoscope.   INDICATION FOR PROCEDURE:  89 African American female with  a history of iron deficiency anemia, rule out peptic ulcer disease,  esophagitis, gastritis, etc.   PREPROCEDURE PREPARATION:  Informed consent was procured from the patient  and the patient was fasted for eight hours prior to the procedure.   PREPROCEDURE PHYSICAL:  VITAL SIGNS:  The patient had stable vital signs.  NECK:  Neck supple.  CHEST:  Chest clear to auscultation.  S1 and S2 regular.  ABDOMEN:  Abdomen soft with normal bowel sounds.   DESCRIPTION OF THE PROCEDURE:  The patient was placed in the left lateral  decubitus position and sedated with 30 mg of Demerol and 5 mg of Versed  intravenously.  Once the patient was adequately sedate and maintained on low-  flow oxygen and continuous cardiac monitoring, the Olympus video  panendoscope was advanced through the mouthpiece over the tongue and into  the esophagus under direct vision.  The entire esophagus appeared normal  with no evidence of ring, stricture, masses, esophagitis or Barrett's  mucosa.  The scope was then advanced into the stomach where a few antral  erosions with a small superficial ulcer were seen in the prepyloric area;  biopsies were done to rule out presence of H. pylori by pathology.  The  proximal small bowel appeared normal.  Random biopsies were done to rule out  celiac sprue.   Retroflexion in the high cardia revealed no abnormality. The  patient tolerated the procedure well without complications.   IMPRESSION:  1. Normal-appearing esophagus.  2. Antral erosions with small superficial ulcer in the prepyloric area,     biopsies done to rule out Helicobacter pylori.  3. Random small bowel biopsies done to rule out celiac sprue.   RECOMMENDATIONS:  1. Await pathology reports.  2. Avoid nonsteroidals including aspirin.  3. Continue PPIs.  4.     Treat with antibiotics if H. pylori present.  5. Proceed with a colonoscopy at this time.  6. Further recommendations to be made after colonoscopy has been done.                                               [place],  M.D.    JNM/MEDQ  D:  04/17/2003  T:  04/17/2003  Job:  [id number]   cc:   Theda Belfast. Baird Cancer, M.D.  [address]  [address] [place] [address]  Fax: [phone]

## 2011-03-07 ENCOUNTER — Encounter: Payer: Self-pay | Admitting: Internal Medicine

## 2011-06-04 ENCOUNTER — Other Ambulatory Visit: Payer: Self-pay | Admitting: Obstetrics and Gynecology

## 2011-06-04 DIAGNOSIS — Z1231 Encounter for screening mammogram for malignant neoplasm of breast: Secondary | ICD-10-CM

## 2011-06-25 ENCOUNTER — Ambulatory Visit
Admission: RE | Admit: 2011-06-25 | Discharge: 2011-06-25 | Disposition: A | Discharge status: Home / Self Care | Payer: Managed Care, Other (non HMO) | Source: Ambulatory Visit | Attending: Obstetrics and Gynecology | Admitting: Obstetrics and Gynecology

## 2011-06-25 DIAGNOSIS — Z1231 Encounter for screening mammogram for malignant neoplasm of breast: Secondary | ICD-10-CM

## 2012-01-14 LAB — HM COLONOSCOPY

## 2012-03-10 ENCOUNTER — Encounter (INDEPENDENT_AMBULATORY_CARE_PROVIDER_SITE_OTHER): Payer: Self-pay | Admitting: Surgery

## 2012-03-10 ENCOUNTER — Ambulatory Visit (INDEPENDENT_AMBULATORY_CARE_PROVIDER_SITE_OTHER): Payer: Managed Care, Other (non HMO) | Admitting: Surgery

## 2012-03-10 VITALS — BP 140/80 | HR 82 | Temp 98.0°F | Resp 18 | Ht 65.0 in | Wt 170.0 lb

## 2012-03-10 DIAGNOSIS — E119 Type 2 diabetes mellitus without complications: Secondary | ICD-10-CM | POA: Insufficient documentation

## 2012-03-10 DIAGNOSIS — E1122 Type 2 diabetes mellitus with diabetic chronic kidney disease: Secondary | ICD-10-CM | POA: Insufficient documentation

## 2012-03-10 DIAGNOSIS — N184 Chronic kidney disease, stage 4 (severe): Secondary | ICD-10-CM | POA: Insufficient documentation

## 2012-03-10 DIAGNOSIS — Z9884 Bariatric surgery status: Secondary | ICD-10-CM

## 2012-03-10 HISTORY — DX: Type 2 diabetes mellitus without complications: E11.9

## 2012-03-10 NOTE — Patient Instructions (Addendum)
The usual labs we suggest annually for the 1st 5 years after surgery:  CMP  CBC  Lipid panel  Folate  Iron level  B12

## 2012-03-10 NOTE — Progress Notes (Signed)
[place], MD,  [address]  [place], [place]    [place] Phone:  [phone] FAX:  [phone]   Re:   Miranda Roman DOB:   [birth year] MRN:   [id number]  ASSESSMENT AND PLAN: 1.  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  I would like to see her once per year.  I will see her back next February her gastric bypass anniversary), and then see her on Miranda once Miranda year follow up.  I also printed the labs that we follow so they can be drawn by Dr. Baird Cancer office.  2.  History of elevated liver enzymes. 3.  History of gall bladder sludge 4.  Diabetes mellitus.  On oral medicine. 5.  Hypercholesterolemia 6.  Hypertension.  On one med. 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  HISTORY OF PRESENT ILLNESS: Chief Complaint  Patient presents with  . Follow-up    66yrRNY   ACATHLENE GARDELLAis Miranda 66y.o. (DOB: [birth year]  AA female who is Miranda patient of SANDERS,ROBYN N, MD and comes to me today for follow up of RYGB.  Doing well.  Admits to not exercising enough.  We talked about 3 miles/day 5 days/week.  She also says her stools are foul smelling and she has Miranda lof of gas.  Probiotics have helped.  I suggested that exercise may also help.  We got her 1 year picture.  The labs I suggested that she get are:  CMP, CBC,  Lipid panel, Folate, Iron level,  B12  Social History: Widow Lives by self. Works in c[employer], [employer] [employer] BP 140/80  Pulse 82  Temp 98 F (36.7 C) (Temporal)  Resp 18  Ht _0  (1.651 m)  Wt 170 lb (77.111 kg)  BMI 28.29 kg/m2  SpO2 100%  Lungs:  Clear Heart:  RRR, no murmure Abdomen:  Soft, has burn in RUQ that is healing.  DATA REVIEWED: None new  DAlphonsa Overall MD, FPulaskiOffice:  [phone]

## 2012-07-14 ENCOUNTER — Other Ambulatory Visit: Payer: Self-pay | Admitting: Obstetrics and Gynecology

## 2012-07-14 DIAGNOSIS — Z1231 Encounter for screening mammogram for malignant neoplasm of breast: Secondary | ICD-10-CM

## 2012-08-03 ENCOUNTER — Ambulatory Visit
Admission: RE | Admit: 2012-08-03 | Discharge: 2012-08-03 | Disposition: A | Discharge status: Home / Self Care | Payer: Managed Care, Other (non HMO) | Source: Ambulatory Visit | Attending: Obstetrics and Gynecology | Admitting: Obstetrics and Gynecology

## 2012-08-03 DIAGNOSIS — Z1231 Encounter for screening mammogram for malignant neoplasm of breast: Secondary | ICD-10-CM

## 2012-09-03 ENCOUNTER — Other Ambulatory Visit (INDEPENDENT_AMBULATORY_CARE_PROVIDER_SITE_OTHER): Payer: Self-pay

## 2012-09-03 ENCOUNTER — Ambulatory Visit (INDEPENDENT_AMBULATORY_CARE_PROVIDER_SITE_OTHER): Payer: Managed Care, Other (non HMO) | Admitting: Surgery

## 2012-09-03 ENCOUNTER — Encounter (INDEPENDENT_AMBULATORY_CARE_PROVIDER_SITE_OTHER): Payer: Self-pay | Admitting: Surgery

## 2012-09-03 VITALS — BP 122/70 | HR 81 | Temp 96.8°F | Resp 18 | Ht 65.0 in | Wt 167.0 lb

## 2012-09-03 DIAGNOSIS — Z9884 Bariatric surgery status: Secondary | ICD-10-CM

## 2012-09-03 NOTE — Progress Notes (Signed)
[place], MD,  [address]  [place], [place]    [place] Phone:  [phone] FAX:  [phone]   Re:   Miranda Roman DOB:   04/27/1946 MRN:   [id number]  ASSESSMENT AND PLAN: 1.  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  She's lost >60 pounds.   We will update her labs.  I would like to see her once per year.    2.  History of elevated liver enzymes. 3.  History of gall bladder sludge 4.  Diabetes mellitus.  On oral medicine. Janumed. 5.  Hypercholesterolemia 6.  Hypertension.  On one med.  Accuretic (actually two meds combined) 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 8.  Foul smelling stools  HISTORY OF PRESENT ILLNESS: Chief Complaint  Patient presents with  . Bariatric Follow Up    estab RNY   Miranda Roman is a 67 y.o. (DOB: 1945/08/22)  AA female who is a patient of SANDERS,ROBYN N, MD and comes to me today for follow up of RYGB.  Doing well.  Admits to not exercising enough.   She also says her stools are foul smelling and she has a lof of gas.  Probiotics have helped.  We also discussed Lactaid, yogurt, and exercise. She said she would like to get another 20 pounds off.  She did not get her labs drawn, so we will  CMP, Lipid panel, Folate, Iron level, B12, Thiamine, HgbA1C  Social History: Widow Lives by self. Works in Editor, commissioning for [employer]: BP 122/70  Pulse 81  Temp(Src) 96.8 F (36 C) (Temporal)  Resp 18  Ht _0  (1.651 m)  Wt 167 lb (75.751 kg)  BMI 27.79 kg/m2  Lungs:  Clear Heart:  RRR, no murmure Abdomen:  Soft.  BS normal.  Looks good.  DATA REVIEWED: None new  [name], MD, [place] Office:  [phone]

## 2012-12-24 ENCOUNTER — Ambulatory Visit (INDEPENDENT_AMBULATORY_CARE_PROVIDER_SITE_OTHER): Payer: Managed Care, Other (non HMO) | Admitting: Internal Medicine

## 2012-12-24 ENCOUNTER — Encounter: Payer: Self-pay | Admitting: Internal Medicine

## 2012-12-24 VITALS — BP 130/64 | HR 93 | Ht 65.0 in | Wt 166.7 lb

## 2012-12-24 DIAGNOSIS — I10 Essential (primary) hypertension: Secondary | ICD-10-CM | POA: Insufficient documentation

## 2012-12-24 DIAGNOSIS — I2510 Atherosclerotic heart disease of native coronary artery without angina pectoris: Secondary | ICD-10-CM

## 2012-12-24 DIAGNOSIS — E785 Hyperlipidemia, unspecified: Secondary | ICD-10-CM | POA: Insufficient documentation

## 2012-12-24 HISTORY — DX: Atherosclerotic heart disease of native coronary artery without angina pectoris: I25.10

## 2012-12-24 HISTORY — DX: Essential (primary) hypertension: I10

## 2012-12-24 NOTE — Patient Instructions (Addendum)
Your physician recommends that you schedule a follow-up appointment in: One year.

## 2012-12-24 NOTE — Progress Notes (Signed)
OFFICE NOTE  Chief Complaint:  Routine follow-up  Primary Care Physician: Miranda Greenland, MD  HPI:  Miranda Roman is a 67 year old female with a history of mild coronary disease by cath in 2007 at the ostium of the left circumflex, also has insulin dependent diabetes, hypertension, dyslipidemia, and morbid obesity; however, she underwent weight loss surgery and had significant improvement in her weight. Recently she has been struggling with a sinus infection, is on some prednisone. Blood pressure was elevated a little bit today at 140/70; however, she also had not taken her blood pressure medication. Overall she feels well. Denies any chest pain, shortness of breath with exertion, presyncope, syncope, or other palpitations or cardiac symptoms.   PMHx:  Past Medical History  Diagnosis Date  . Anemia   . Diabetes mellitus   . Hypertension   . Hyperlipidemia   . Osteoporosis   . Sickle cell anemia     Past Surgical History  Procedure Laterality Date  . Gastric bypass  08/14/2009  . Bone resection  01/2006    wrist  . Tubal ligation  04/28/1977    FAMHx:  Family History  Problem Relation Age of Onset  . Diabetes Mother   . Heart disease Mother   . Diabetes Father   . Heart disease Father   . Diabetes Sister   . Diabetes Maternal Aunt   . Diabetes Maternal Uncle   . Diabetes Maternal Grandmother   . Diabetes Maternal Grandfather     SOCHx:   reports that she quit smoking about 8 years ago. She does not have any smokeless tobacco history on file. She reports that she drinks about 1.2 ounces of alcohol per week. She reports that she does not use illicit drugs.  ALLERGIES:  No Known Allergies  ROS: A comprehensive review of systems was negative except for: Constitutional: positive for weight loss  HOME MEDS: Current Outpatient Prescriptions  Medication Sig Dispense Refill  . aspirin 81 MG tablet Take 81 mg by mouth daily.      [name] atorvastatin (LIPITOR) 40 MG  tablet Take 40 mg by mouth daily.      . fish oil-omega-3 fatty acids 1000 MG capsule Take 2 g by mouth daily.      . fluticasone (FLONASE) 50 MCG/ACT nasal spray Place 2 sprays into the nose daily.      [name] ibandronate (BONIVA) 150 MG tablet Take 150 mg by mouth every 30 (thirty) days. Take in the morning with a full glass of water, on an empty stomach, and do not take anything else by mouth or lie down for the next 30 min.      . quinapril-hydrochlorothiazide (ACCURETIC) 20-12.5 MG per tablet Take 1 tablet by mouth daily.      . sitaGLIPtan-metformin (JANUMET) 50-1000 MG per tablet Take 1 tablet by mouth daily.        No current facility-administered medications for this visit.    LABS/IMAGING: No results found for this or any previous visit (from the past 48 hour(s)). No results found.  VITALS: BP 130/64  Pulse 93  Ht _0  (1.651 m)  Wt 166 lb 11.2 oz (75.615 kg)  BMI 27.74 kg/m2  EXAM: General appearance: alert and no distress Neck: no adenopathy, no carotid bruit, no JVD, supple, symmetrical, trachea midline and thyroid not enlarged, symmetric, no tenderness/mass/nodules Lungs: clear to auscultation bilaterally Heart: regular rate and rhythm, S1, S2 normal, no murmur, click, rub or gallop Abdomen: soft, non-tender; bowel sounds normal; no masses,  no organomegaly Extremities: extremities normal, atraumatic, no cyanosis or edema Pulses: 2+ and symmetric Skin: Skin color, texture, turgor normal. No rashes or lesions Neurologic: Grossly normal  EKG: Normal sinus rhythm at 93  ASSESSMENT: 1. Mild obstructive coronary disease 2. Morbid obesity status post bariatric surgery 3. Diabetes, no longer insulin-dependent 4. Hypertension 5. Hyperlipidemia  PLAN: 1.   overall Miranda Roman is doing very well. She's managed to lose a little more weight. I encouraged her to continue to do good exercise. She plans to have a lipid profile and labs checks her primary care doctor's office. I  would recommend no changes in her medications at this time.  Will plan to see her back annually.  Pixie Casino, MD, [facility] Attending Cardiologist [facility] 12/24/2012, 5:05 PM

## 2013-03-30 ENCOUNTER — Other Ambulatory Visit: Payer: Self-pay | Admitting: Obstetrics and Gynecology

## 2013-07-05 ENCOUNTER — Other Ambulatory Visit: Payer: Self-pay

## 2013-07-05 DIAGNOSIS — Z1231 Encounter for screening mammogram for malignant neoplasm of breast: Secondary | ICD-10-CM

## 2013-08-04 ENCOUNTER — Ambulatory Visit
Admission: RE | Admit: 2013-08-04 | Discharge: 2013-08-04 | Disposition: A | Discharge status: Home / Self Care | Payer: Self-pay | Source: Ambulatory Visit

## 2013-08-04 DIAGNOSIS — Z1231 Encounter for screening mammogram for malignant neoplasm of breast: Secondary | ICD-10-CM

## 2013-08-19 ENCOUNTER — Ambulatory Visit (INDEPENDENT_AMBULATORY_CARE_PROVIDER_SITE_OTHER): Payer: Medicare Other | Admitting: Surgery

## 2013-08-19 ENCOUNTER — Encounter (INDEPENDENT_AMBULATORY_CARE_PROVIDER_SITE_OTHER): Payer: Self-pay | Admitting: Surgery

## 2013-08-19 VITALS — BP 122/81 | HR 77 | Temp 98.6°F | Resp 18 | Ht 65.0 in | Wt 169.0 lb

## 2013-08-19 DIAGNOSIS — Z9884 Bariatric surgery status: Secondary | ICD-10-CM

## 2013-08-19 NOTE — Progress Notes (Signed)
[place], MD,  [address]  [place], [place]    [place] Phone:  [phone] FAX:  [phone]   Re:   Miranda Roman DOB:   03-Dec-1945 MRN:   [id number]  ASSESSMENT AND PLAN: 1.  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  She's lost >60 pounds.   She thinks that Dr. Baird Cancer checked her labs last summer.  We'll check with Dr. Gala Murdoch office.  I would like to see her once per year.    2.  History of elevated liver enzymes. 3.  History of gall bladder sludge 4.  Diabetes mellitus.  On oral medicine. Janumed. 5.  Hypercholesterolemia 6.  Hypertension.  On one med.  Accuretic (actually two meds combined) 7.  Foul smelling stools - about the same 8.  Seen by Dr. Guy Sandifer at cardiology  She has been cathed twice - but the last time about 10 years ago.  She has a strong family hx of CAD, but she is doing well. 9.  Back pain, radiates around to front  She is going to talk to Dr. Baird Cancer about this. 62.  Now retired.  HISTORY OF PRESENT ILLNESS: Chief Complaint  Patient presents with  . Breast Cancer Long Term Follow Up   Miranda Roman is a 68 y.o. (DOB: 12/20/1945)  AA female who is a patient of SANDERS,ROBYN N, MD and comes to me today for follow up of RYGB.  Doing well from a gastric bypass point.  Her biggest issue is back pain.  She is going to check with Dr. Baird Cancer about this.  We do not have her recent labs, but will check for CMP, Lipid panel, Folate, Iron level, B12, Thiamine, HgbA1C  Social History: Widow Lives by self. 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  She is going to [place] for her sister's birthday in about 1 week.  PHYSICAL EXAM: BP 122/81  Pulse 77  Temp(Src) 98.6 F (37 C) (Temporal)  Resp 18  Ht _0  (1.651 m)  Wt 169 lb (76.658 kg)  BMI 28.12 kg/m2  Lungs:  Clear Heart:  RRR, no murmure Abdomen:  Soft.  BS normal.  Looks  good.  DATA REVIEWED: None new  [name], MD, [place] Office:  [phone]

## 2013-09-09 ENCOUNTER — Encounter (INDEPENDENT_AMBULATORY_CARE_PROVIDER_SITE_OTHER): Payer: Self-pay

## 2013-09-28 ENCOUNTER — Encounter (HOSPITAL_COMMUNITY)
Admission: RE | Admit: 2013-09-28 | Discharge: 2013-09-28 | Disposition: A | Discharge status: Home / Self Care | Payer: Managed Care, Other (non HMO) | Source: Ambulatory Visit | Attending: Nephrology | Admitting: Nephrology

## 2013-09-28 DIAGNOSIS — N183 Chronic kidney disease, stage 3 unspecified: Secondary | ICD-10-CM | POA: Insufficient documentation

## 2013-09-28 DIAGNOSIS — D638 Anemia in other chronic diseases classified elsewhere: Secondary | ICD-10-CM | POA: Insufficient documentation

## 2013-09-28 LAB — POCT HEMOGLOBIN-HEMACUE: Hemoglobin: 9 g/dL — ABNORMAL LOW (ref 12.0–15.0)

## 2013-09-28 MED ORDER — EPOETIN ALFA 20000 UNIT/ML IJ SOLN: 20000.0000 [IU] | INTRAMUSCULAR | Status: DC

## 2013-09-28 MED ORDER — EPOETIN ALFA 20000 UNIT/ML IJ SOLN
INTRAMUSCULAR | Status: AC
  Administered 2013-09-28: 20000 [IU] via SUBCUTANEOUS
  Filled 2013-09-28: qty 1

## 2013-10-20 ENCOUNTER — Encounter (HOSPITAL_COMMUNITY)
Admission: RE | Admit: 2013-10-20 | Discharge: 2013-10-20 | Disposition: A | Discharge status: Home / Self Care | Payer: Managed Care, Other (non HMO) | Source: Ambulatory Visit | Attending: Nephrology | Admitting: Nephrology

## 2013-10-20 ENCOUNTER — Encounter (HOSPITAL_COMMUNITY): Payer: Medicare Other

## 2013-10-20 DIAGNOSIS — N183 Chronic kidney disease, stage 3 unspecified: Secondary | ICD-10-CM | POA: Insufficient documentation

## 2013-10-20 DIAGNOSIS — D638 Anemia in other chronic diseases classified elsewhere: Secondary | ICD-10-CM | POA: Insufficient documentation

## 2013-10-20 LAB — IRON AND TIBC
Iron: 83 ug/dL (ref 42–135)
Saturation Ratios: 30 % (ref 20–55)
TIBC: 277 ug/dL (ref 250–470)
UIBC: 194 ug/dL (ref 125–400)

## 2013-10-20 LAB — POCT HEMOGLOBIN-HEMACUE: Hemoglobin: 9.2 g/dL — ABNORMAL LOW (ref 12.0–15.0)

## 2013-10-20 LAB — FERRITIN: Ferritin: 39 ng/mL (ref 10–291)

## 2013-10-20 MED ORDER — EPOETIN ALFA 20000 UNIT/ML IJ SOLN: 20000.0000 [IU] | INTRAMUSCULAR | Status: DC

## 2013-10-20 MED ORDER — EPOETIN ALFA 20000 UNIT/ML IJ SOLN
INTRAMUSCULAR | Status: AC
  Administered 2013-10-20: 20000 [IU] via SUBCUTANEOUS
  Filled 2013-10-20: qty 1

## 2013-11-10 ENCOUNTER — Encounter (HOSPITAL_COMMUNITY)
Admission: RE | Admit: 2013-11-10 | Discharge: 2013-11-10 | Disposition: A | Discharge status: Home / Self Care | Payer: Medicare Other | Source: Ambulatory Visit | Attending: Nephrology | Admitting: Nephrology

## 2013-11-10 DIAGNOSIS — D638 Anemia in other chronic diseases classified elsewhere: Secondary | ICD-10-CM | POA: Insufficient documentation

## 2013-11-10 DIAGNOSIS — N183 Chronic kidney disease, stage 3 unspecified: Secondary | ICD-10-CM | POA: Insufficient documentation

## 2013-11-10 MED ORDER — EPOETIN ALFA 20000 UNIT/ML IJ SOLN
INTRAMUSCULAR | Status: AC
  Administered 2013-11-10: 20000 [IU] via SUBCUTANEOUS
  Filled 2013-11-10: qty 1

## 2013-11-10 MED ORDER — EPOETIN ALFA 20000 UNIT/ML IJ SOLN: 20000.0000 [IU] | INTRAMUSCULAR | Status: DC

## 2013-11-11 LAB — POCT HEMOGLOBIN-HEMACUE: Hemoglobin: 9.2 g/dL — ABNORMAL LOW (ref 12.0–15.0)

## 2013-12-01 ENCOUNTER — Encounter (HOSPITAL_COMMUNITY)
Admission: RE | Admit: 2013-12-01 | Discharge: 2013-12-01 | Disposition: A | Discharge status: Home / Self Care | Payer: Medicare Other | Source: Ambulatory Visit | Attending: Nephrology | Admitting: Nephrology

## 2013-12-01 LAB — IRON AND TIBC
Iron: 75 ug/dL (ref 42–135)
Saturation Ratios: 27 % (ref 20–55)
TIBC: 278 ug/dL (ref 250–470)
UIBC: 203 ug/dL (ref 125–400)

## 2013-12-01 LAB — FERRITIN: Ferritin: 53 ng/mL (ref 10–291)

## 2013-12-01 LAB — POCT HEMOGLOBIN-HEMACUE: Hemoglobin: 9.3 g/dL — ABNORMAL LOW (ref 12.0–15.0)

## 2013-12-01 MED ORDER — EPOETIN ALFA 20000 UNIT/ML IJ SOLN
INTRAMUSCULAR | Status: AC
  Administered 2013-12-01: 20000 [IU] via SUBCUTANEOUS
  Filled 2013-12-01: qty 1

## 2013-12-01 MED ORDER — EPOETIN ALFA 10000 UNIT/ML IJ SOLN
INTRAMUSCULAR | Status: AC
  Administered 2013-12-01: 10000 [IU] via SUBCUTANEOUS
  Filled 2013-12-01: qty 1

## 2013-12-01 MED ORDER — EPOETIN ALFA 40000 UNIT/ML IJ SOLN: 30000.0000 [IU] | INTRAMUSCULAR | Status: DC

## 2013-12-13 ENCOUNTER — Other Ambulatory Visit: Payer: Self-pay | Admitting: Obstetrics and Gynecology

## 2013-12-14 ENCOUNTER — Other Ambulatory Visit (HOSPITAL_COMMUNITY): Payer: Self-pay | Admitting: *Deleted

## 2013-12-15 ENCOUNTER — Inpatient Hospital Stay (HOSPITAL_COMMUNITY): Admission: RE | Admit: 2013-12-15 | Payer: Managed Care, Other (non HMO) | Source: Ambulatory Visit

## 2013-12-15 ENCOUNTER — Encounter (HOSPITAL_COMMUNITY): Payer: Medicare Other

## 2013-12-16 ENCOUNTER — Encounter (HOSPITAL_COMMUNITY)
Admission: RE | Admit: 2013-12-16 | Discharge: 2013-12-16 | Disposition: A | Discharge status: Home / Self Care | Payer: Medicare Other | Source: Ambulatory Visit | Attending: Nephrology | Admitting: Nephrology

## 2013-12-16 DIAGNOSIS — N183 Chronic kidney disease, stage 3 unspecified: Secondary | ICD-10-CM | POA: Insufficient documentation

## 2013-12-16 DIAGNOSIS — D638 Anemia in other chronic diseases classified elsewhere: Secondary | ICD-10-CM | POA: Insufficient documentation

## 2013-12-16 LAB — POCT HEMOGLOBIN-HEMACUE: Hemoglobin: 10 g/dL — ABNORMAL LOW (ref 12.0–15.0)

## 2013-12-16 MED ORDER — EPOETIN ALFA 40000 UNIT/ML IJ SOLN: 30000.0000 [IU] | INTRAMUSCULAR | Status: DC

## 2013-12-16 MED ORDER — EPOETIN ALFA 10000 UNIT/ML IJ SOLN
INTRAMUSCULAR | Status: AC
  Administered 2013-12-16: 10000 [IU] via SUBCUTANEOUS
  Filled 2013-12-16: qty 1

## 2013-12-16 MED ORDER — EPOETIN ALFA 20000 UNIT/ML IJ SOLN
INTRAMUSCULAR | Status: AC
  Administered 2013-12-16: 20000 [IU] via SUBCUTANEOUS
  Filled 2013-12-16: qty 1

## 2013-12-22 ENCOUNTER — Encounter (HOSPITAL_COMMUNITY): Payer: Medicare Other

## 2013-12-29 ENCOUNTER — Encounter (HOSPITAL_COMMUNITY)
Admission: RE | Admit: 2013-12-29 | Discharge: 2013-12-29 | Disposition: A | Discharge status: Home / Self Care | Payer: Medicare Other | Source: Ambulatory Visit | Attending: Nephrology | Admitting: Nephrology

## 2013-12-29 LAB — POCT HEMOGLOBIN-HEMACUE: Hemoglobin: 10.1 g/dL — ABNORMAL LOW (ref 12.0–15.0)

## 2013-12-29 LAB — FERRITIN: Ferritin: 35 ng/mL (ref 10–291)

## 2013-12-29 LAB — IRON AND TIBC
Iron: 70 ug/dL (ref 42–135)
Saturation Ratios: 24 % (ref 20–55)
TIBC: 288 ug/dL (ref 250–470)
UIBC: 218 ug/dL (ref 125–400)

## 2013-12-29 MED ORDER — EPOETIN ALFA 40000 UNIT/ML IJ SOLN: 30000.0000 [IU] | INTRAMUSCULAR | Status: DC

## 2013-12-29 MED ORDER — EPOETIN ALFA 20000 UNIT/ML IJ SOLN
INTRAMUSCULAR | Status: AC
  Administered 2013-12-29: 20000 [IU]
  Filled 2013-12-29: qty 1

## 2013-12-29 MED ORDER — EPOETIN ALFA 10000 UNIT/ML IJ SOLN
INTRAMUSCULAR | Status: AC
  Administered 2013-12-29: 10000 [IU]
  Filled 2013-12-29: qty 1

## 2014-01-12 ENCOUNTER — Encounter (HOSPITAL_COMMUNITY)
Admission: RE | Admit: 2014-01-12 | Discharge: 2014-01-12 | Disposition: A | Discharge status: Home / Self Care | Payer: Medicare Other | Source: Ambulatory Visit | Attending: Nephrology | Admitting: Nephrology

## 2014-01-12 DIAGNOSIS — N183 Chronic kidney disease, stage 3 unspecified: Secondary | ICD-10-CM | POA: Diagnosis not present

## 2014-01-12 DIAGNOSIS — D638 Anemia in other chronic diseases classified elsewhere: Secondary | ICD-10-CM | POA: Insufficient documentation

## 2014-01-12 LAB — POCT HEMOGLOBIN-HEMACUE: Hemoglobin: 10.2 g/dL — ABNORMAL LOW (ref 12.0–15.0)

## 2014-01-12 MED ORDER — EPOETIN ALFA 20000 UNIT/ML IJ SOLN
INTRAMUSCULAR | Status: AC
  Administered 2014-01-12: 20000 [IU] via SUBCUTANEOUS
  Filled 2014-01-12: qty 1

## 2014-01-12 MED ORDER — EPOETIN ALFA 10000 UNIT/ML IJ SOLN
INTRAMUSCULAR | Status: AC
  Administered 2014-01-12: 10000 [IU] via SUBCUTANEOUS
  Filled 2014-01-12: qty 1

## 2014-01-12 MED ORDER — EPOETIN ALFA 40000 UNIT/ML IJ SOLN: 30000.0000 [IU] | INTRAMUSCULAR | Status: DC

## 2014-01-26 ENCOUNTER — Encounter (HOSPITAL_COMMUNITY)
Admission: RE | Admit: 2014-01-26 | Discharge: 2014-01-26 | Disposition: A | Discharge status: Home / Self Care | Payer: Medicare Other | Source: Ambulatory Visit | Attending: Nephrology | Admitting: Nephrology

## 2014-01-26 DIAGNOSIS — D638 Anemia in other chronic diseases classified elsewhere: Secondary | ICD-10-CM | POA: Diagnosis not present

## 2014-01-26 LAB — POCT HEMOGLOBIN-HEMACUE: Hemoglobin: 9.6 g/dL — ABNORMAL LOW (ref 12.0–15.0)

## 2014-01-26 LAB — FERRITIN: Ferritin: 28 ng/mL (ref 10–291)

## 2014-01-26 LAB — IRON AND TIBC
Iron: 75 ug/dL (ref 42–135)
Saturation Ratios: 25 % (ref 20–55)
TIBC: 295 ug/dL (ref 250–470)
UIBC: 220 ug/dL (ref 125–400)

## 2014-01-26 MED ORDER — EPOETIN ALFA 10000 UNIT/ML IJ SOLN
INTRAMUSCULAR | Status: AC
  Administered 2014-01-26: 10000 [IU] via SUBCUTANEOUS
  Filled 2014-01-26: qty 1

## 2014-01-26 MED ORDER — EPOETIN ALFA 20000 UNIT/ML IJ SOLN
INTRAMUSCULAR | Status: AC
  Administered 2014-01-26: 20000 [IU] via SUBCUTANEOUS
  Filled 2014-01-26: qty 1

## 2014-01-26 MED ORDER — EPOETIN ALFA 40000 UNIT/ML IJ SOLN: 30000.0000 [IU] | INTRAMUSCULAR | Status: DC

## 2014-02-09 ENCOUNTER — Encounter (HOSPITAL_COMMUNITY)
Admission: RE | Admit: 2014-02-09 | Discharge: 2014-02-09 | Disposition: A | Discharge status: Home / Self Care | Payer: Medicare Other | Source: Ambulatory Visit | Attending: Nephrology | Admitting: Nephrology

## 2014-02-09 DIAGNOSIS — N183 Chronic kidney disease, stage 3 unspecified: Secondary | ICD-10-CM | POA: Insufficient documentation

## 2014-02-09 DIAGNOSIS — D638 Anemia in other chronic diseases classified elsewhere: Secondary | ICD-10-CM | POA: Diagnosis not present

## 2014-02-09 LAB — POCT HEMOGLOBIN-HEMACUE: Hemoglobin: 9.6 g/dL — ABNORMAL LOW (ref 12.0–15.0)

## 2014-02-09 MED ORDER — EPOETIN ALFA 10000 UNIT/ML IJ SOLN
INTRAMUSCULAR | Status: AC
  Administered 2014-02-09: 10000 [IU] via SUBCUTANEOUS
  Filled 2014-02-09: qty 1

## 2014-02-09 MED ORDER — EPOETIN ALFA 20000 UNIT/ML IJ SOLN
INTRAMUSCULAR | Status: AC
  Administered 2014-02-09: 20000 [IU] via SUBCUTANEOUS
  Filled 2014-02-09: qty 1

## 2014-02-09 MED ORDER — EPOETIN ALFA 40000 UNIT/ML IJ SOLN: 30000.0000 [IU] | INTRAMUSCULAR | Status: DC

## 2014-02-23 ENCOUNTER — Inpatient Hospital Stay (HOSPITAL_COMMUNITY): Admission: RE | Admit: 2014-02-23 | Payer: Medicare Other | Source: Ambulatory Visit

## 2014-04-06 ENCOUNTER — Encounter: Payer: Self-pay | Admitting: Internal Medicine

## 2014-04-06 ENCOUNTER — Ambulatory Visit (INDEPENDENT_AMBULATORY_CARE_PROVIDER_SITE_OTHER): Payer: Medicare Other | Admitting: Internal Medicine

## 2014-04-06 VITALS — BP 138/76 | HR 83 | Ht 64.0 in | Wt 170.3 lb

## 2014-04-06 DIAGNOSIS — I2583 Coronary atherosclerosis due to lipid rich plaque: Secondary | ICD-10-CM

## 2014-04-06 DIAGNOSIS — R011 Cardiac murmur, unspecified: Secondary | ICD-10-CM

## 2014-04-06 DIAGNOSIS — E118 Type 2 diabetes mellitus with unspecified complications: Secondary | ICD-10-CM

## 2014-04-06 DIAGNOSIS — E785 Hyperlipidemia, unspecified: Secondary | ICD-10-CM

## 2014-04-06 DIAGNOSIS — I10 Essential (primary) hypertension: Secondary | ICD-10-CM

## 2014-04-06 DIAGNOSIS — I2510 Atherosclerotic heart disease of native coronary artery without angina pectoris: Secondary | ICD-10-CM

## 2014-04-06 DIAGNOSIS — Z9884 Bariatric surgery status: Secondary | ICD-10-CM

## 2014-04-06 NOTE — Patient Instructions (Signed)
Your physician has requested that you have an echocardiogram. Echocardiography is a painless test that uses sound waves to create images of your heart. It provides your doctor with information about the size and shape of your heart and how well your heart's chambers and valves are working. This procedure takes approximately one hour. There are no restrictions for this procedure.  Your physician wants you to follow-up in: 1 year with Dr. Debara Pickett. You will receive a reminder letter in the mail two months in advance. If you don't receive a letter, please call our office to schedule the follow-up appointment.

## 2014-04-06 NOTE — Progress Notes (Signed)
OFFICE NOTE  Chief Complaint:  Routine follow-up  Primary Care Physician: [name], MD  HPI:  Miranda Roman is a 68 year old female with a history of mild coronary disease by cath in 2007 at the ostium of the left circumflex, also has insulin dependent diabetes, hypertension, dyslipidemia, and morbid obesity; however, she underwent weight loss surgery and had significant improvement in her weight. Recently she has been struggling with a sinus infection, is on some prednisone. Blood pressure was elevated a little bit today at 140/70; however, she also had not taken her blood pressure medication.   Miranda Roman returns today and is without complaints. She denies any shortness of breath, chest pain or palpitations .  PMHx:  Past Medical History  Diagnosis Date  . Anemia   . Diabetes mellitus   . Hypertension   . Hyperlipidemia   . Osteoporosis   . Sickle cell anemia     Past Surgical History  Procedure Laterality Date  . Gastric bypass  08/14/2009  . Bone resection  01/2006    wrist  . Tubal ligation  04/28/1977    FAMHx:  Family History  Problem Relation Age of Onset  . Diabetes Mother   . Heart disease Mother   . Diabetes Father   . Heart disease Father   . Diabetes Sister   . Diabetes Maternal Aunt   . Diabetes Maternal Uncle   . Diabetes Maternal Grandmother   . Diabetes Maternal Grandfather     SOCHx:   reports that she quit smoking about 10 years ago. She does not have any smokeless tobacco history on file. She reports that she drinks about 1.2 ounces of alcohol per week. She reports that she does not use illicit drugs.  ALLERGIES:  No Known Allergies  ROS: A comprehensive review of systems was negative.  HOME MEDS: Current Outpatient Prescriptions  Medication Sig Dispense Refill  . aspirin 81 MG tablet Take 81 mg by mouth daily.      [name] atorvastatin (LIPITOR) 40 MG tablet Take 40 mg by mouth daily.      . calcitRIOL (ROCALTROL)  0.25 MCG capsule Take 0.25 mcg by mouth daily.       . cholecalciferol (VITAMIN D) 1000 UNITS tablet Take 1,000 Units by mouth daily.      . ferrous fumarate (FERRO-SEQUELS) 50 MG CR tablet Take 50 mg by mouth 3 (three) times daily with meals.      . fish oil-omega-3 fatty acids 1000 MG capsule Take 2 g by mouth daily.      . quinapril-hydrochlorothiazide (ACCURETIC) 20-12.5 MG per tablet Take 1 tablet by mouth daily.      . saxagliptin HCl (ONGLYZA) 2.5 MG TABS tablet Take 2.5 mg by mouth daily.      . sitaGLIPtan-metformin (JANUMET) 50-1000 MG per tablet Take 1 tablet by mouth daily.       . traMADol (ULTRAM) 50 MG tablet        No current facility-administered medications for this visit.    LABS/IMAGING: No results found for this or any previous visit (from the past 48 hour(s)). No results found.  VITALS: BP 138/76  Pulse 83  Ht 5' 4" (1.626 m)  Wt 170 lb 4.8 oz (77.248 kg)  BMI 29.22 kg/m2  EXAM: General appearance: alert and no distress Neck: no adenopathy, no carotid bruit, no JVD, supple, symmetrical, trachea midline and thyroid not enlarged, symmetric, no tenderness/mass/nodules Lungs: clear to auscultation bilaterally Heart: regular rate and rhythm, S1, S2  normal, systolic murmur: early systolic 2/6, crescendo at apex and diastolic murmur: early diastolic 3/6, blowing at 2nd right intercostal space Abdomen: soft, non-tender; bowel sounds normal; no masses,  no organomegaly Extremities: extremities normal, atraumatic, no cyanosis or edema Pulses: 2+ and symmetric Skin: Skin color, texture, turgor normal. No rashes or lesions Neurologic: Grossly normal  EKG: Normal sinus rhythm at 83  ASSESSMENT: 1. Mild obstructive coronary disease 2. Morbid obesity status post bariatric surgery 3. Diabetes, no longer insulin-dependent 4. Hypertension - managed by primary 5. 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   Overall Miranda Roman is doing very well. I  encouraged her to continue to do good exercise. She does have prominent systolic and diastolic murmurs which have been auscultated in the past. I believe they're somewhat louder today. Her last echocardiogram showed trivial AI and mild MR and 2012. I would recommend another echocardiogram to look for stability of the valves in any change in the degree of regurgitation. She plans to have a lipid profile and labs checks her primary care doctor's office. I would recommend no changes in her medications at this time.  Will plan to see her back annually.  Pixie Casino, MD, [facility] Attending Cardiologist [facility] 04/06/2014, 1:19 PM

## 2014-04-13 ENCOUNTER — Ambulatory Visit (HOSPITAL_COMMUNITY)
Admission: RE | Admit: 2014-04-13 | Discharge: 2014-04-13 | Disposition: A | Discharge status: Home / Self Care | Payer: Medicare Other | Source: Ambulatory Visit | Attending: Cardiology | Admitting: Cardiology

## 2014-04-13 DIAGNOSIS — E785 Hyperlipidemia, unspecified: Secondary | ICD-10-CM | POA: Insufficient documentation

## 2014-04-13 DIAGNOSIS — E119 Type 2 diabetes mellitus without complications: Secondary | ICD-10-CM | POA: Insufficient documentation

## 2014-04-13 DIAGNOSIS — I359 Nonrheumatic aortic valve disorder, unspecified: Secondary | ICD-10-CM

## 2014-04-13 DIAGNOSIS — Z8249 Family history of ischemic heart disease and other diseases of the circulatory system: Secondary | ICD-10-CM | POA: Diagnosis not present

## 2014-04-13 DIAGNOSIS — R011 Cardiac murmur, unspecified: Secondary | ICD-10-CM

## 2014-04-13 DIAGNOSIS — I10 Essential (primary) hypertension: Secondary | ICD-10-CM | POA: Diagnosis not present

## 2014-04-13 DIAGNOSIS — I2510 Atherosclerotic heart disease of native coronary artery without angina pectoris: Secondary | ICD-10-CM

## 2014-04-13 NOTE — Progress Notes (Signed)
2D Echocardiogram Complete.  04/13/2014   Miranda Roman [place], [place]

## 2014-04-13 NOTE — Progress Notes (Signed)
LMTCB for test (echo) results

## 2014-05-17 ENCOUNTER — Other Ambulatory Visit (HOSPITAL_COMMUNITY): Payer: Self-pay | Admitting: Internal Medicine

## 2014-05-17 DIAGNOSIS — Z136 Encounter for screening for cardiovascular disorders: Secondary | ICD-10-CM

## 2014-05-17 DIAGNOSIS — I739 Peripheral vascular disease, unspecified: Secondary | ICD-10-CM

## 2014-05-26 ENCOUNTER — Ambulatory Visit (HOSPITAL_BASED_OUTPATIENT_CLINIC_OR_DEPARTMENT_OTHER)
Admission: RE | Admit: 2014-05-26 | Discharge: 2014-05-26 | Disposition: A | Discharge status: Home / Self Care | Payer: Medicare Other | Source: Ambulatory Visit | Attending: Cardiology | Admitting: Cardiology

## 2014-05-26 ENCOUNTER — Ambulatory Visit (HOSPITAL_COMMUNITY)
Admission: RE | Admit: 2014-05-26 | Discharge: 2014-05-26 | Disposition: A | Discharge status: Home / Self Care | Payer: Medicare Other | Source: Ambulatory Visit | Attending: Cardiology | Admitting: Cardiology

## 2014-05-26 DIAGNOSIS — I739 Peripheral vascular disease, unspecified: Secondary | ICD-10-CM | POA: Diagnosis not present

## 2014-05-26 DIAGNOSIS — Z136 Encounter for screening for cardiovascular disorders: Secondary | ICD-10-CM

## 2014-05-26 NOTE — Progress Notes (Signed)
Abdominal Aortic Duplex Completed °Brianna L Mazza,RVT °

## 2014-05-26 NOTE — Progress Notes (Signed)
Lower Extremity Arterial Duplex Completed. Miranda Roman

## 2014-11-21 ENCOUNTER — Other Ambulatory Visit: Payer: Self-pay

## 2014-11-21 DIAGNOSIS — Z1231 Encounter for screening mammogram for malignant neoplasm of breast: Secondary | ICD-10-CM

## 2014-12-14 ENCOUNTER — Telehealth: Payer: Self-pay | Admitting: Internal Medicine

## 2014-12-14 NOTE — Telephone Encounter (Signed)
Received records from [facility] for appointment with Dr Debara Pickett on 01/30/15.  Records given to [facility] (medical records) for Dr Lysbeth Penner schedule on 01/30/15.  lp

## 2014-12-15 ENCOUNTER — Ambulatory Visit
Admission: RE | Admit: 2014-12-15 | Discharge: 2014-12-15 | Disposition: A | Discharge status: Home / Self Care | Payer: Medicare Other | Source: Ambulatory Visit

## 2014-12-15 DIAGNOSIS — Z1231 Encounter for screening mammogram for malignant neoplasm of breast: Secondary | ICD-10-CM

## 2015-01-30 ENCOUNTER — Encounter: Payer: Self-pay | Admitting: Internal Medicine

## 2015-01-30 ENCOUNTER — Ambulatory Visit (INDEPENDENT_AMBULATORY_CARE_PROVIDER_SITE_OTHER): Payer: Medicare Other | Admitting: Internal Medicine

## 2015-01-30 VITALS — BP 108/66 | HR 83 | Ht 65.0 in | Wt 174.6 lb

## 2015-01-30 DIAGNOSIS — I2510 Atherosclerotic heart disease of native coronary artery without angina pectoris: Secondary | ICD-10-CM

## 2015-01-30 DIAGNOSIS — I10 Essential (primary) hypertension: Secondary | ICD-10-CM

## 2015-01-30 DIAGNOSIS — E119 Type 2 diabetes mellitus without complications: Secondary | ICD-10-CM

## 2015-01-30 DIAGNOSIS — E785 Hyperlipidemia, unspecified: Secondary | ICD-10-CM

## 2015-01-30 DIAGNOSIS — I2583 Coronary atherosclerosis due to lipid rich plaque: Secondary | ICD-10-CM

## 2015-01-30 DIAGNOSIS — R079 Chest pain, unspecified: Secondary | ICD-10-CM

## 2015-01-30 DIAGNOSIS — Z9884 Bariatric surgery status: Secondary | ICD-10-CM

## 2015-01-30 HISTORY — DX: Chest pain, unspecified: R07.9

## 2015-01-30 NOTE — Patient Instructions (Signed)
Your physician has requested that you have an exercise stress myoview. For further information please visit [email or web address]. Please follow instruction sheet, as given.  Your physician wants you to follow-up in: 1 year with Dr. Debara Pickett. You will receive a reminder letter in the mail two months in advance. If you don't receive a letter, please call our office to schedule the follow-up appointment.

## 2015-01-30 NOTE — Progress Notes (Signed)
OFFICE NOTE  Chief Complaint:  Recent chest pain  Primary Care Physician: [name], MD  HPI:  Miranda Roman is a 69 year old female with a history of mild coronary disease by cath in 2007 at the ostium of the left circumflex, also has insulin dependent diabetes, hypertension, dyslipidemia, and morbid obesity; however, she underwent weight loss surgery and had significant improvement in her weight. Recently she has been struggling with a sinus infection, is on some prednisone. Blood pressure was elevated a little bit today at 140/70; however, she also had not taken her blood pressure medication.   Miranda Roman returns today and is without complaints. She denies any shortness of breath, chest pain or palpitations .  I had the pleasure of seeing Miranda Roman back in the office today. Recently she was seen by her primary care provider and had complained of some chest pain. This was occurring over a couple of days and noted mostly at rest however sometimes with exertion. She reports not being particularly active. She says it is since resolved and she's not had recurrence. It was not necessarily associated with eating or similar to her reflux symptoms in the past. She did not take medication for it. Her last stress test was in 2010. She does have mild coronary artery disease based on catheterization in 2007.  PMHx:  Past Medical History  Diagnosis Date  . Anemia   . Diabetes mellitus   . Hypertension   . Hyperlipidemia   . Osteoporosis   . Sickle cell anemia     Past Surgical History  Procedure Laterality Date  . Gastric bypass  08/14/2009  . Bone resection  01/2006    wrist  . Tubal ligation  04/28/1977    FAMHx:  Family History  Problem Relation Age of Onset  . Diabetes Mother   . Heart disease Mother   . Diabetes Father   . Heart disease Father   . Diabetes Sister   . Diabetes Maternal Aunt   . Diabetes Maternal Uncle   . Diabetes Maternal Grandmother     . Diabetes Maternal Grandfather     SOCHx:   reports that she quit smoking about 10 years ago. She does not have any smokeless tobacco history on file. She reports that she drinks about 1.2 oz of alcohol per week. She reports that she does not use illicit drugs.  ALLERGIES:  No Known Allergies  ROS: A comprehensive review of systems was negative except for: Cardiovascular: positive for chest pain  HOME MEDS: Current Outpatient Prescriptions  Medication Sig Dispense Refill  . aspirin 81 MG tablet Take 81 mg by mouth daily.    [name] atorvastatin (LIPITOR) 40 MG tablet Take 40 mg by mouth daily.    . calcitRIOL (ROCALTROL) 0.25 MCG capsule Take 0.25 mcg by mouth daily.     . cholecalciferol (VITAMIN D) 1000 UNITS tablet Take 1,000 Units by mouth daily.    . ferrous fumarate (FERRO-SEQUELS) 50 MG CR tablet Take 50 mg by mouth 3 (three) times daily with meals.    . fish oil-omega-3 fatty acids 1000 MG capsule Take 2 g by mouth daily.    . Glucosamine-Chondroitin (GLUCOSAMINE CHONDR COMPLEX PO) Take 6 tablets by mouth daily.    . quinapril-hydrochlorothiazide (ACCURETIC) 20-12.5 MG per tablet Take 1 tablet by mouth daily.    . saxagliptin HCl (ONGLYZA) 2.5 MG TABS tablet Take 2.5 mg by mouth daily.    . traMADol (ULTRAM) 50 MG tablet  No current facility-administered medications for this visit.    LABS/IMAGING: No results found for this or any previous visit (from the past 48 hour(s)). No results found.  VITALS: BP 108/66 mmHg  Pulse 83  Ht _0  (1.651 m)  Wt 174 lb 9.6 oz (79.198 kg)  BMI 29.05 kg/m2  EXAM: General appearance: alert and no distress Neck: no adenopathy, no carotid bruit, no JVD, supple, symmetrical, trachea midline and thyroid not enlarged, symmetric, no tenderness/mass/nodules Lungs: clear to auscultation bilaterally Heart: regular rate and rhythm, S1, S2 normal, systolic murmur: early systolic 2/6, crescendo at apex and diastolic murmur: early diastolic  3/6, blowing at 2nd right intercostal space Abdomen: soft, non-tender; bowel sounds normal; no masses,  no organomegaly Extremities: extremities normal, atraumatic, no cyanosis or edema Pulses: 2+ and symmetric Skin: Skin color, texture, turgor normal. No rashes or lesions Neurologic: Grossly normal  EKG: Normal sinus rhythm at 83  ASSESSMENT: 1. Chest pain at rest- history of mild non-obstructive coronary disease 2. Morbid obesity status post bariatric surgery 3. Diabetes, no longer insulin-dependent 4. Hypertension - managed by primary 5. 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  Miranda Roman recently described some chest pain which was mostly at rest however may been associated with some exertion. It seemed to resolve on its own however and she's not had recurrence. She is interested in getting back in a more exercise and concerned that this could represent coronary ischemia. Her last stress test was about 6 years ago. Her EKG is sinus rhythm without ischemic changes at rest. I think is reasonable to consider stress testing as she wishes to become more active and started more aggressive exercise program. She does have multiple coronary risk factors include diabetes, hypertension and dyslipidemia as well as known mild nonobstructive coronary disease in the past. Plan for an exercise Myoview and will call her with those results. Otherwise follow-up in 1 year if her stress test is negative.  Miranda Casino, MD, [facility] Attending Cardiologist Miranda Roman 01/30/2015, 9:30 AM

## 2015-02-06 ENCOUNTER — Telehealth (HOSPITAL_COMMUNITY): Payer: Self-pay

## 2015-02-06 NOTE — Telephone Encounter (Signed)
Encounter complete. 

## 2015-02-07 ENCOUNTER — Telehealth (HOSPITAL_COMMUNITY): Payer: Self-pay

## 2015-02-07 NOTE — Telephone Encounter (Signed)
Encounter complete. 

## 2015-02-08 ENCOUNTER — Ambulatory Visit (HOSPITAL_COMMUNITY)
Admission: RE | Admit: 2015-02-08 | Discharge: 2015-02-08 | Disposition: A | Discharge status: Home / Self Care | Payer: Medicare Other | Source: Ambulatory Visit | Attending: Cardiovascular Disease | Admitting: Cardiovascular Disease

## 2015-02-08 DIAGNOSIS — E785 Hyperlipidemia, unspecified: Secondary | ICD-10-CM | POA: Diagnosis not present

## 2015-02-08 DIAGNOSIS — I10 Essential (primary) hypertension: Secondary | ICD-10-CM | POA: Diagnosis not present

## 2015-02-08 DIAGNOSIS — R079 Chest pain, unspecified: Secondary | ICD-10-CM

## 2015-02-08 DIAGNOSIS — I2510 Atherosclerotic heart disease of native coronary artery without angina pectoris: Secondary | ICD-10-CM | POA: Diagnosis not present

## 2015-02-08 DIAGNOSIS — E119 Type 2 diabetes mellitus without complications: Secondary | ICD-10-CM | POA: Diagnosis not present

## 2015-02-08 LAB — MYOCARDIAL PERFUSION IMAGING
Estimated workload: 6.9 METS
Exercise duration (min): 6 min
Exercise duration (sec): 1 s
LV dias vol: 77 mL
LV sys vol: 39 mL
MPHR: 153 {beats}/min
Peak HR: 151 {beats}/min
Percent HR: 98 %
RPE: 15
Rest HR: 50 {beats}/min
SDS: 3
SRS: 0
SSS: 3
TID: 1.38

## 2015-02-08 MED ORDER — TECHNETIUM TC 99M SESTAMIBI GENERIC - CARDIOLITE
30.2000 | Freq: Once | INTRAVENOUS | Status: AC | PRN
  Administered 2015-02-08: 30.2 via INTRAVENOUS

## 2015-02-08 MED ORDER — TECHNETIUM TC 99M SESTAMIBI GENERIC - CARDIOLITE
10.8000 | Freq: Once | INTRAVENOUS | Status: AC | PRN
  Administered 2015-02-08: 11 via INTRAVENOUS

## 2015-02-14 ENCOUNTER — Other Ambulatory Visit: Payer: Self-pay | Admitting: *Deleted

## 2015-02-14 DIAGNOSIS — I519 Heart disease, unspecified: Secondary | ICD-10-CM

## 2015-02-21 ENCOUNTER — Other Ambulatory Visit (HOSPITAL_COMMUNITY): Payer: Medicare Other

## 2015-03-06 ENCOUNTER — Other Ambulatory Visit: Payer: Self-pay

## 2015-03-06 ENCOUNTER — Ambulatory Visit (HOSPITAL_COMMUNITY): Payer: Medicare Other | Attending: Cardiology

## 2015-03-06 DIAGNOSIS — I351 Nonrheumatic aortic (valve) insufficiency: Secondary | ICD-10-CM | POA: Insufficient documentation

## 2015-03-06 DIAGNOSIS — Z87891 Personal history of nicotine dependence: Secondary | ICD-10-CM | POA: Diagnosis not present

## 2015-03-06 DIAGNOSIS — E119 Type 2 diabetes mellitus without complications: Secondary | ICD-10-CM | POA: Diagnosis not present

## 2015-03-06 DIAGNOSIS — I10 Essential (primary) hypertension: Secondary | ICD-10-CM | POA: Diagnosis not present

## 2015-03-06 DIAGNOSIS — D571 Sickle-cell disease without crisis: Secondary | ICD-10-CM | POA: Insufficient documentation

## 2015-03-06 DIAGNOSIS — R079 Chest pain, unspecified: Secondary | ICD-10-CM | POA: Diagnosis not present

## 2015-03-06 DIAGNOSIS — Z8249 Family history of ischemic heart disease and other diseases of the circulatory system: Secondary | ICD-10-CM | POA: Diagnosis not present

## 2015-03-06 DIAGNOSIS — I2510 Atherosclerotic heart disease of native coronary artery without angina pectoris: Secondary | ICD-10-CM | POA: Insufficient documentation

## 2015-03-06 DIAGNOSIS — I501 Left ventricular failure: Secondary | ICD-10-CM | POA: Diagnosis not present

## 2015-03-06 DIAGNOSIS — E785 Hyperlipidemia, unspecified: Secondary | ICD-10-CM | POA: Diagnosis not present

## 2015-03-06 DIAGNOSIS — I519 Heart disease, unspecified: Secondary | ICD-10-CM

## 2015-03-14 ENCOUNTER — Other Ambulatory Visit: Payer: Self-pay | Admitting: *Deleted

## 2015-03-14 DIAGNOSIS — I351 Nonrheumatic aortic (valve) insufficiency: Secondary | ICD-10-CM

## 2015-03-14 DIAGNOSIS — I5189 Other ill-defined heart diseases: Secondary | ICD-10-CM

## 2015-05-16 DIAGNOSIS — L602 Onychogryphosis: Secondary | ICD-10-CM | POA: Diagnosis not present

## 2015-05-16 DIAGNOSIS — L84 Corns and callosities: Secondary | ICD-10-CM | POA: Diagnosis not present

## 2015-05-16 DIAGNOSIS — E1351 Other specified diabetes mellitus with diabetic peripheral angiopathy without gangrene: Secondary | ICD-10-CM | POA: Diagnosis not present

## 2015-06-21 DIAGNOSIS — N183 Chronic kidney disease, stage 3 (moderate): Secondary | ICD-10-CM | POA: Diagnosis not present

## 2015-06-21 DIAGNOSIS — I129 Hypertensive chronic kidney disease with stage 1 through stage 4 chronic kidney disease, or unspecified chronic kidney disease: Secondary | ICD-10-CM | POA: Diagnosis not present

## 2015-06-21 DIAGNOSIS — E1122 Type 2 diabetes mellitus with diabetic chronic kidney disease: Secondary | ICD-10-CM | POA: Diagnosis not present

## 2015-06-21 DIAGNOSIS — N08 Glomerular disorders in diseases classified elsewhere: Secondary | ICD-10-CM | POA: Diagnosis not present

## 2015-07-26 DIAGNOSIS — G5761 Lesion of plantar nerve, right lower limb: Secondary | ICD-10-CM | POA: Diagnosis not present

## 2015-07-26 DIAGNOSIS — E1351 Other specified diabetes mellitus with diabetic peripheral angiopathy without gangrene: Secondary | ICD-10-CM | POA: Diagnosis not present

## 2015-07-26 DIAGNOSIS — L84 Corns and callosities: Secondary | ICD-10-CM | POA: Diagnosis not present

## 2015-07-26 DIAGNOSIS — M21621 Bunionette of right foot: Secondary | ICD-10-CM | POA: Diagnosis not present

## 2015-07-26 DIAGNOSIS — M21622 Bunionette of left foot: Secondary | ICD-10-CM | POA: Diagnosis not present

## 2015-07-26 DIAGNOSIS — L602 Onychogryphosis: Secondary | ICD-10-CM | POA: Diagnosis not present

## 2015-09-11 DIAGNOSIS — N08 Glomerular disorders in diseases classified elsewhere: Secondary | ICD-10-CM | POA: Diagnosis not present

## 2015-09-11 DIAGNOSIS — E1122 Type 2 diabetes mellitus with diabetic chronic kidney disease: Secondary | ICD-10-CM | POA: Diagnosis not present

## 2015-09-11 DIAGNOSIS — I129 Hypertensive chronic kidney disease with stage 1 through stage 4 chronic kidney disease, or unspecified chronic kidney disease: Secondary | ICD-10-CM | POA: Diagnosis not present

## 2015-09-11 DIAGNOSIS — E559 Vitamin D deficiency, unspecified: Secondary | ICD-10-CM | POA: Diagnosis not present

## 2015-09-11 DIAGNOSIS — Z0000 Encounter for general adult medical examination without abnormal findings: Secondary | ICD-10-CM | POA: Diagnosis not present

## 2015-09-11 DIAGNOSIS — D649 Anemia, unspecified: Secondary | ICD-10-CM | POA: Diagnosis not present

## 2015-09-11 DIAGNOSIS — N183 Chronic kidney disease, stage 3 (moderate): Secondary | ICD-10-CM | POA: Diagnosis not present

## 2015-09-17 DIAGNOSIS — E118 Type 2 diabetes mellitus with unspecified complications: Secondary | ICD-10-CM | POA: Diagnosis not present

## 2015-09-17 DIAGNOSIS — N184 Chronic kidney disease, stage 4 (severe): Secondary | ICD-10-CM | POA: Diagnosis not present

## 2015-09-17 DIAGNOSIS — D649 Anemia, unspecified: Secondary | ICD-10-CM | POA: Diagnosis not present

## 2015-09-17 DIAGNOSIS — N183 Chronic kidney disease, stage 3 (moderate): Secondary | ICD-10-CM | POA: Diagnosis not present

## 2015-09-17 DIAGNOSIS — D631 Anemia in chronic kidney disease: Secondary | ICD-10-CM | POA: Diagnosis not present

## 2015-09-17 DIAGNOSIS — I129 Hypertensive chronic kidney disease with stage 1 through stage 4 chronic kidney disease, or unspecified chronic kidney disease: Secondary | ICD-10-CM | POA: Diagnosis not present

## 2015-09-17 DIAGNOSIS — N2581 Secondary hyperparathyroidism of renal origin: Secondary | ICD-10-CM | POA: Diagnosis not present

## 2015-09-26 ENCOUNTER — Other Ambulatory Visit (HOSPITAL_COMMUNITY): Payer: Self-pay | Admitting: *Deleted

## 2015-09-27 ENCOUNTER — Ambulatory Visit (HOSPITAL_COMMUNITY)
Admission: RE | Admit: 2015-09-27 | Discharge: 2015-09-27 | Disposition: A | Discharge status: Home / Self Care | Payer: Medicare Other | Source: Ambulatory Visit | Attending: Nephrology | Admitting: Nephrology

## 2015-09-27 DIAGNOSIS — N184 Chronic kidney disease, stage 4 (severe): Secondary | ICD-10-CM | POA: Insufficient documentation

## 2015-09-27 DIAGNOSIS — D509 Iron deficiency anemia, unspecified: Secondary | ICD-10-CM | POA: Insufficient documentation

## 2015-09-27 DIAGNOSIS — D631 Anemia in chronic kidney disease: Secondary | ICD-10-CM | POA: Insufficient documentation

## 2015-09-27 DIAGNOSIS — Z5181 Encounter for therapeutic drug level monitoring: Secondary | ICD-10-CM | POA: Diagnosis not present

## 2015-09-27 DIAGNOSIS — Z79899 Other long term (current) drug therapy: Secondary | ICD-10-CM | POA: Diagnosis not present

## 2015-09-27 LAB — POCT HEMOGLOBIN-HEMACUE: Hemoglobin: 8.9 g/dL — ABNORMAL LOW (ref 12.0–15.0)

## 2015-09-27 MED ORDER — EPOETIN ALFA 20000 UNIT/ML IJ SOLN
INTRAMUSCULAR | Status: AC
  Administered 2015-09-27: 20000 [IU] via SUBCUTANEOUS
  Filled 2015-09-27: qty 1

## 2015-09-27 MED ORDER — SODIUM CHLORIDE 0.9 % IV SOLN
510.0000 mg | Freq: Once | INTRAVENOUS | Status: AC
  Administered 2015-09-27: 510 mg via INTRAVENOUS
  Filled 2015-09-27: qty 17

## 2015-09-27 MED ORDER — EPOETIN ALFA 10000 UNIT/ML IJ SOLN
INTRAMUSCULAR | Status: AC
  Administered 2015-09-27: 10000 [IU] via SUBCUTANEOUS
  Filled 2015-09-27: qty 1

## 2015-09-27 MED ORDER — EPOETIN ALFA 40000 UNIT/ML IJ SOLN: 30000.0000 [IU] | INTRAMUSCULAR | Status: DC

## 2015-09-27 NOTE — Discharge Instructions (Signed)
Epoetin Alfa injection What is this medicine? EPOETIN ALFA (e POE e tin AL fa) helps your body make more red blood cells. This medicine is used to treat anemia caused by chronic kidney failure, cancer chemotherapy, or HIV-therapy. It may also be used before surgery if you have anemia. This medicine may be used for other purposes; ask your health care provider or pharmacist if you have questions. What should I tell my health care provider before I take this medicine? They need to know if you have any of these conditions: -blood clotting disorders -cancer patient not on chemotherapy -cystic fibrosis -heart disease, such as angina or heart failure -hemoglobin level of 12 g/dL or greater -high blood pressure -low levels of folate, iron, or vitamin B12 -seizures -an unusual or allergic reaction to erythropoietin, albumin, benzyl alcohol, hamster proteins, other medicines, foods, dyes, or preservatives -pregnant or trying to get pregnant -breast-feeding How should I use this medicine? This medicine is for injection into a vein or under the skin. It is usually given by a health care professional in a hospital or clinic setting. If you get this medicine at home, you will be taught how to prepare and give this medicine. Use exactly as directed. Take your medicine at regular intervals. Do not take your medicine more often than directed. It is important that you put your used needles and syringes in a special sharps container. Do not put them in a trash can. If you do not have a sharps container, call your pharmacist or healthcare provider to get one. Talk to your pediatrician regarding the use of this medicine in children. While this drug may be prescribed for selected conditions, precautions do apply. Overdosage: If you think you have taken too much of this medicine contact a poison control center or emergency room at once. NOTE: This medicine is only for you. Do not share this medicine with  others. What if I miss a dose? If you miss a dose, take it as soon as you can. If it is almost time for your next dose, take only that dose. Do not take double or extra doses. What may interact with this medicine? Do not take this medicine with any of the following medications: -darbepoetin alfa This list may not describe all possible interactions. Give your health care provider a list of all the medicines, herbs, non-prescription drugs, or dietary supplements you use. Also tell them if you smoke, drink alcohol, or use illegal drugs. Some items may interact with your medicine. What should I watch for while using this medicine? Visit your prescriber or health care professional for regular checks on your progress and for the needed blood tests and blood pressure measurements. It is especially important for the doctor to make sure your hemoglobin level is in the desired range, to limit the risk of potential side effects and to give you the best benefit. Keep all appointments for any recommended tests. Check your blood pressure as directed. Ask your doctor what your blood pressure should be and when you should contact him or her. As your body makes more red blood cells, you may need to take iron, folic acid, or vitamin B supplements. Ask your doctor or health care provider which products are right for you. If you have kidney disease continue dietary restrictions, even though this medication can make you feel better. Talk with your doctor or health care professional about the foods you eat and the vitamins that you take. What side effects may I notice  from receiving this medicine? Side effects that you should report to your doctor or health care professional as soon as possible: -allergic reactions like skin rash, itching or hives, swelling of the face, lips, or tongue -breathing problems -changes in vision -chest pain -confusion, trouble speaking or understanding -feeling faint or lightheaded,  falls -high blood pressure -muscle aches or pains -pain, swelling, warmth in the leg -rapid weight gain -severe headaches -sudden numbness or weakness of the face, arm or leg -trouble walking, dizziness, loss of balance or coordination -seizures (convulsions) -swelling of the ankles, feet, hands -unusually weak or tired Side effects that usually do not require medical attention (report to your doctor or health care professional if they continue or are bothersome): -diarrhea -fever, chills (flu-like symptoms) -headaches -nausea, vomiting -redness, stinging, or swelling at site where injected This list may not describe all possible side effects. Call your doctor for medical advice about side effects. You may report side effects to FDA at 1-800-FDA-1088. Where should I keep my medicine? Keep out of the reach of children. Store in a refrigerator between 2 and 8 degrees C (36 and 46 degrees F). Do not freeze or shake. Throw away any unused portion if using a single-dose vial. Multi-dose vials can be kept in the refrigerator for up to 21 days after the initial dose. Throw away unused medicine. NOTE: This sheet is a summary. It may not cover all possible information. If you have questions about this medicine, talk to your doctor, pharmacist, or health care provider.    2016, Elsevier/Gold Standard. (2008-06-06 10:25:44) Ferumoxytol injection What is this medicine? FERUMOXYTOL is an iron complex. Iron is used to make healthy red blood cells, which carry oxygen and nutrients throughout the body. This medicine is used to treat iron deficiency anemia in people with chronic kidney disease. This medicine may be used for other purposes; ask your health care provider or pharmacist if you have questions. What should I tell my health care provider before I take this medicine? They need to know if you have any of these conditions: -anemia not caused by low iron levels -high levels of iron in the  blood -magnetic resonance imaging (MRI) test scheduled -an unusual or allergic reaction to iron, other medicines, foods, dyes, or preservatives -pregnant or trying to get pregnant -breast-feeding How should I use this medicine? This medicine is for injection into a vein. It is given by a health care professional in a hospital or clinic setting. Talk to your pediatrician regarding the use of this medicine in children. Special care may be needed. Overdosage: If you think you have taken too much of this medicine contact a poison control center or emergency room at once. NOTE: This medicine is only for you. Do not share this medicine with others. What if I miss a dose? It is important not to miss your dose. Call your doctor or health care professional if you are unable to keep an appointment. What may interact with this medicine? This medicine may interact with the following medications: -other iron products This list may not describe all possible interactions. Give your health care provider a list of all the medicines, herbs, non-prescription drugs, or dietary supplements you use. Also tell them if you smoke, drink alcohol, or use illegal drugs. Some items may interact with your medicine. What should I watch for while using this medicine? Visit your doctor or healthcare professional regularly. Tell your doctor or healthcare professional if your symptoms do not start to get better  or if they get worse. You may need blood work done while you are taking this medicine. You may need to follow a special diet. Talk to your doctor. Foods that contain iron include: whole grains/cereals, dried fruits, beans, or peas, leafy green vegetables, and organ meats (liver, kidney). What side effects may I notice from receiving this medicine? Side effects that you should report to your doctor or health care professional as soon as possible: -allergic reactions like skin rash, itching or hives, swelling of the face,  lips, or tongue -breathing problems -changes in blood pressure -feeling faint or lightheaded, falls -fever or chills -flushing, sweating, or hot feelings -swelling of the ankles or feet Side effects that usually do not require medical attention (Report these to your doctor or health care professional if they continue or are bothersome.): -diarrhea -headache -nausea, vomiting -stomach pain This list may not describe all possible side effects. Call your doctor for medical advice about side effects. You may report side effects to FDA at 1-800-FDA-1088. Where should I keep my medicine? This drug is given in a hospital or clinic and will not be stored at home. NOTE: This sheet is a summary. It may not cover all possible information. If you have questions about this medicine, talk to your doctor, pharmacist, or health care provider.    2016, Elsevier/Gold Standard. (2012-02-06 15:23:36)

## 2015-10-22 DIAGNOSIS — N184 Chronic kidney disease, stage 4 (severe): Secondary | ICD-10-CM | POA: Diagnosis not present

## 2015-10-22 DIAGNOSIS — N2581 Secondary hyperparathyroidism of renal origin: Secondary | ICD-10-CM | POA: Diagnosis not present

## 2015-10-25 ENCOUNTER — Encounter (HOSPITAL_COMMUNITY)
Admission: RE | Admit: 2015-10-25 | Discharge: 2015-10-25 | Disposition: A | Discharge status: Home / Self Care | Payer: Medicare Other | Source: Ambulatory Visit | Attending: Nephrology | Admitting: Nephrology

## 2015-10-25 DIAGNOSIS — N184 Chronic kidney disease, stage 4 (severe): Secondary | ICD-10-CM | POA: Diagnosis not present

## 2015-10-25 DIAGNOSIS — Z5181 Encounter for therapeutic drug level monitoring: Secondary | ICD-10-CM | POA: Insufficient documentation

## 2015-10-25 DIAGNOSIS — Z79899 Other long term (current) drug therapy: Secondary | ICD-10-CM | POA: Insufficient documentation

## 2015-10-25 DIAGNOSIS — D631 Anemia in chronic kidney disease: Secondary | ICD-10-CM | POA: Insufficient documentation

## 2015-10-25 LAB — POCT HEMOGLOBIN-HEMACUE: Hemoglobin: 9.8 g/dL — ABNORMAL LOW (ref 12.0–15.0)

## 2015-10-25 MED ORDER — EPOETIN ALFA 20000 UNIT/ML IJ SOLN
INTRAMUSCULAR | Status: AC
  Administered 2015-10-25: 20000 [IU] via SUBCUTANEOUS
  Filled 2015-10-25: qty 1

## 2015-10-25 MED ORDER — EPOETIN ALFA 10000 UNIT/ML IJ SOLN
INTRAMUSCULAR | Status: AC
  Administered 2015-10-25: 10000 [IU] via SUBCUTANEOUS
  Filled 2015-10-25: qty 1

## 2015-10-25 MED ORDER — EPOETIN ALFA 40000 UNIT/ML IJ SOLN: 30000.0000 [IU] | INTRAMUSCULAR | Status: DC

## 2015-11-15 ENCOUNTER — Encounter (HOSPITAL_COMMUNITY): Payer: Medicare Other

## 2015-11-19 ENCOUNTER — Encounter (HOSPITAL_COMMUNITY)
Admission: RE | Admit: 2015-11-19 | Discharge: 2015-11-19 | Disposition: A | Discharge status: Home / Self Care | Payer: Medicare Other | Source: Ambulatory Visit | Attending: Nephrology | Admitting: Nephrology

## 2015-11-19 DIAGNOSIS — Z79899 Other long term (current) drug therapy: Secondary | ICD-10-CM | POA: Diagnosis not present

## 2015-11-19 DIAGNOSIS — D631 Anemia in chronic kidney disease: Secondary | ICD-10-CM | POA: Insufficient documentation

## 2015-11-19 DIAGNOSIS — Z5181 Encounter for therapeutic drug level monitoring: Secondary | ICD-10-CM | POA: Diagnosis not present

## 2015-11-19 DIAGNOSIS — N184 Chronic kidney disease, stage 4 (severe): Secondary | ICD-10-CM | POA: Diagnosis not present

## 2015-11-19 LAB — IRON AND TIBC
Iron: 86 ug/dL (ref 28–170)
Saturation Ratios: 35 % — ABNORMAL HIGH (ref 10.4–31.8)
TIBC: 246 ug/dL — ABNORMAL LOW (ref 250–450)
UIBC: 160 ug/dL

## 2015-11-19 LAB — FERRITIN: Ferritin: 149 ng/mL (ref 11–307)

## 2015-11-19 LAB — POCT HEMOGLOBIN-HEMACUE: Hemoglobin: 9.7 g/dL — ABNORMAL LOW (ref 12.0–15.0)

## 2015-11-19 MED ORDER — EPOETIN ALFA 20000 UNIT/ML IJ SOLN
INTRAMUSCULAR | Status: AC
  Administered 2015-11-19: 20000 [IU] via SUBCUTANEOUS
  Filled 2015-11-19: qty 1

## 2015-11-19 MED ORDER — EPOETIN ALFA 10000 UNIT/ML IJ SOLN
INTRAMUSCULAR | Status: AC
  Administered 2015-11-19: 10000 [IU] via SUBCUTANEOUS
  Filled 2015-11-19: qty 1

## 2015-11-19 MED ORDER — EPOETIN ALFA 40000 UNIT/ML IJ SOLN: 30000.0000 [IU] | INTRAMUSCULAR | Status: DC

## 2015-11-20 ENCOUNTER — Other Ambulatory Visit: Payer: Self-pay

## 2015-11-20 DIAGNOSIS — Z1231 Encounter for screening mammogram for malignant neoplasm of breast: Secondary | ICD-10-CM

## 2015-12-10 ENCOUNTER — Encounter (HOSPITAL_COMMUNITY)
Admission: RE | Admit: 2015-12-10 | Discharge: 2015-12-10 | Disposition: A | Discharge status: Home / Self Care | Payer: Medicare Other | Source: Ambulatory Visit | Attending: Nephrology | Admitting: Nephrology

## 2015-12-10 DIAGNOSIS — N184 Chronic kidney disease, stage 4 (severe): Secondary | ICD-10-CM | POA: Insufficient documentation

## 2015-12-10 DIAGNOSIS — Z79899 Other long term (current) drug therapy: Secondary | ICD-10-CM | POA: Diagnosis not present

## 2015-12-10 DIAGNOSIS — Z5181 Encounter for therapeutic drug level monitoring: Secondary | ICD-10-CM | POA: Insufficient documentation

## 2015-12-10 DIAGNOSIS — D631 Anemia in chronic kidney disease: Secondary | ICD-10-CM | POA: Diagnosis not present

## 2015-12-10 LAB — POCT HEMOGLOBIN-HEMACUE: Hemoglobin: 9.9 g/dL — ABNORMAL LOW (ref 12.0–15.0)

## 2015-12-10 MED ORDER — EPOETIN ALFA 40000 UNIT/ML IJ SOLN: 30000.0000 [IU] | INTRAMUSCULAR | Status: DC

## 2015-12-10 MED ORDER — EPOETIN ALFA 10000 UNIT/ML IJ SOLN
INTRAMUSCULAR | Status: AC
  Administered 2015-12-10: 10000 [IU] via SUBCUTANEOUS
  Filled 2015-12-10: qty 1

## 2015-12-10 MED ORDER — EPOETIN ALFA 20000 UNIT/ML IJ SOLN
INTRAMUSCULAR | Status: AC
  Administered 2015-12-10: 20000 [IU] via SUBCUTANEOUS
  Filled 2015-12-10: qty 1

## 2015-12-17 ENCOUNTER — Ambulatory Visit
Admission: RE | Admit: 2015-12-17 | Discharge: 2015-12-17 | Disposition: A | Discharge status: Home / Self Care | Payer: Medicare Other | Source: Ambulatory Visit

## 2015-12-17 DIAGNOSIS — Z1231 Encounter for screening mammogram for malignant neoplasm of breast: Secondary | ICD-10-CM | POA: Diagnosis not present

## 2015-12-31 ENCOUNTER — Encounter (HOSPITAL_COMMUNITY)
Admission: RE | Admit: 2015-12-31 | Discharge: 2015-12-31 | Disposition: A | Discharge status: Home / Self Care | Payer: Medicare Other | Source: Ambulatory Visit | Attending: Nephrology | Admitting: Nephrology

## 2015-12-31 DIAGNOSIS — N184 Chronic kidney disease, stage 4 (severe): Secondary | ICD-10-CM | POA: Diagnosis not present

## 2015-12-31 DIAGNOSIS — D631 Anemia in chronic kidney disease: Secondary | ICD-10-CM | POA: Diagnosis not present

## 2015-12-31 DIAGNOSIS — Z79899 Other long term (current) drug therapy: Secondary | ICD-10-CM | POA: Diagnosis not present

## 2015-12-31 DIAGNOSIS — Z5181 Encounter for therapeutic drug level monitoring: Secondary | ICD-10-CM | POA: Diagnosis not present

## 2015-12-31 LAB — IRON AND TIBC
Iron: 95 ug/dL (ref 28–170)
Saturation Ratios: 32 % — ABNORMAL HIGH (ref 10.4–31.8)
TIBC: 297 ug/dL (ref 250–450)
UIBC: 202 ug/dL

## 2015-12-31 LAB — POCT HEMOGLOBIN-HEMACUE: Hemoglobin: 10.8 g/dL — ABNORMAL LOW (ref 12.0–15.0)

## 2015-12-31 LAB — FERRITIN: Ferritin: 217 ng/mL (ref 11–307)

## 2015-12-31 MED ORDER — EPOETIN ALFA 10000 UNIT/ML IJ SOLN
INTRAMUSCULAR | Status: AC
  Administered 2015-12-31: 10000 [IU] via SUBCUTANEOUS
  Filled 2015-12-31: qty 1

## 2015-12-31 MED ORDER — EPOETIN ALFA 20000 UNIT/ML IJ SOLN
INTRAMUSCULAR | Status: AC
  Administered 2015-12-31: 20000 [IU] via SUBCUTANEOUS
  Filled 2015-12-31: qty 1

## 2015-12-31 MED ORDER — EPOETIN ALFA 40000 UNIT/ML IJ SOLN: 30000.0000 [IU] | INTRAMUSCULAR | Status: DC

## 2016-01-14 DIAGNOSIS — D631 Anemia in chronic kidney disease: Secondary | ICD-10-CM | POA: Diagnosis not present

## 2016-01-14 DIAGNOSIS — N2581 Secondary hyperparathyroidism of renal origin: Secondary | ICD-10-CM | POA: Diagnosis not present

## 2016-01-14 DIAGNOSIS — E118 Type 2 diabetes mellitus with unspecified complications: Secondary | ICD-10-CM | POA: Diagnosis not present

## 2016-01-14 DIAGNOSIS — N184 Chronic kidney disease, stage 4 (severe): Secondary | ICD-10-CM | POA: Diagnosis not present

## 2016-01-14 DIAGNOSIS — I129 Hypertensive chronic kidney disease with stage 1 through stage 4 chronic kidney disease, or unspecified chronic kidney disease: Secondary | ICD-10-CM | POA: Diagnosis not present

## 2016-01-14 DIAGNOSIS — N183 Chronic kidney disease, stage 3 (moderate): Secondary | ICD-10-CM | POA: Diagnosis not present

## 2016-01-17 DIAGNOSIS — R87612 Low grade squamous intraepithelial lesion on cytologic smear of cervix (LGSIL): Secondary | ICD-10-CM | POA: Diagnosis not present

## 2016-01-17 DIAGNOSIS — Z01419 Encounter for gynecological examination (general) (routine) without abnormal findings: Secondary | ICD-10-CM | POA: Diagnosis not present

## 2016-01-17 DIAGNOSIS — R87610 Atypical squamous cells of undetermined significance on cytologic smear of cervix (ASC-US): Secondary | ICD-10-CM | POA: Diagnosis not present

## 2016-01-21 ENCOUNTER — Encounter (HOSPITAL_COMMUNITY)
Admission: RE | Admit: 2016-01-21 | Discharge: 2016-01-21 | Disposition: A | Discharge status: Home / Self Care | Payer: Medicare Other | Source: Ambulatory Visit | Attending: Nephrology | Admitting: Nephrology

## 2016-01-21 ENCOUNTER — Encounter (HOSPITAL_COMMUNITY): Payer: Medicare Other

## 2016-01-21 DIAGNOSIS — D631 Anemia in chronic kidney disease: Secondary | ICD-10-CM | POA: Diagnosis not present

## 2016-01-21 DIAGNOSIS — Z79899 Other long term (current) drug therapy: Secondary | ICD-10-CM | POA: Insufficient documentation

## 2016-01-21 DIAGNOSIS — Z5181 Encounter for therapeutic drug level monitoring: Secondary | ICD-10-CM | POA: Diagnosis not present

## 2016-01-21 DIAGNOSIS — I129 Hypertensive chronic kidney disease with stage 1 through stage 4 chronic kidney disease, or unspecified chronic kidney disease: Secondary | ICD-10-CM | POA: Diagnosis not present

## 2016-01-21 DIAGNOSIS — N08 Glomerular disorders in diseases classified elsewhere: Secondary | ICD-10-CM | POA: Diagnosis not present

## 2016-01-21 DIAGNOSIS — N184 Chronic kidney disease, stage 4 (severe): Secondary | ICD-10-CM | POA: Diagnosis not present

## 2016-01-21 DIAGNOSIS — E1122 Type 2 diabetes mellitus with diabetic chronic kidney disease: Secondary | ICD-10-CM | POA: Diagnosis not present

## 2016-01-21 LAB — POCT HEMOGLOBIN-HEMACUE: Hemoglobin: 10.7 g/dL — ABNORMAL LOW (ref 12.0–15.0)

## 2016-01-21 MED ORDER — EPOETIN ALFA 40000 UNIT/ML IJ SOLN: 30000.0000 [IU] | INTRAMUSCULAR | Status: DC

## 2016-01-21 MED ORDER — EPOETIN ALFA 10000 UNIT/ML IJ SOLN
INTRAMUSCULAR | Status: AC
  Administered 2016-01-21: 10000 [IU] via SUBCUTANEOUS
  Filled 2016-01-21: qty 1

## 2016-01-21 MED ORDER — EPOETIN ALFA 20000 UNIT/ML IJ SOLN
INTRAMUSCULAR | Status: AC
  Administered 2016-01-21: 20000 [IU] via SUBCUTANEOUS
  Filled 2016-01-21: qty 1

## 2016-01-31 DIAGNOSIS — H2513 Age-related nuclear cataract, bilateral: Secondary | ICD-10-CM | POA: Diagnosis not present

## 2016-01-31 DIAGNOSIS — Z794 Long term (current) use of insulin: Secondary | ICD-10-CM | POA: Diagnosis not present

## 2016-01-31 DIAGNOSIS — E119 Type 2 diabetes mellitus without complications: Secondary | ICD-10-CM | POA: Diagnosis not present

## 2016-02-11 ENCOUNTER — Ambulatory Visit (INDEPENDENT_AMBULATORY_CARE_PROVIDER_SITE_OTHER): Payer: Medicare Other | Admitting: Internal Medicine

## 2016-02-11 ENCOUNTER — Encounter: Payer: Self-pay | Admitting: Internal Medicine

## 2016-02-11 VITALS — BP 131/84 | HR 86 | Ht 64.0 in | Wt 176.8 lb

## 2016-02-11 DIAGNOSIS — E785 Hyperlipidemia, unspecified: Secondary | ICD-10-CM

## 2016-02-11 DIAGNOSIS — I10 Essential (primary) hypertension: Secondary | ICD-10-CM | POA: Diagnosis not present

## 2016-02-11 DIAGNOSIS — Z9889 Other specified postprocedural states: Secondary | ICD-10-CM

## 2016-02-11 DIAGNOSIS — I351 Nonrheumatic aortic (valve) insufficiency: Secondary | ICD-10-CM

## 2016-02-11 DIAGNOSIS — Z9884 Bariatric surgery status: Secondary | ICD-10-CM

## 2016-02-11 HISTORY — DX: Nonrheumatic aortic (valve) insufficiency: I35.1

## 2016-02-11 NOTE — Progress Notes (Signed)
OFFICE NOTE  Chief Complaint:  No complaints  Primary Care Physician: [name], MD  HPI:  Miranda Roman is a 69 year old female with a history of mild coronary disease by cath in 2007 at the ostium of the left circumflex, also has insulin dependent diabetes, hypertension, dyslipidemia, and morbid obesity; however, she underwent weight loss surgery and had significant improvement in her weight. Recently she has been struggling with a sinus infection, is on some prednisone. Blood pressure was elevated a little bit today at 140/70; however, she also had not taken her blood pressure medication.   Miranda Roman returns today and is without complaints. She denies any shortness of breath, chest pain or palpitations .  I had the pleasure of seeing Miranda Roman back in the office today. Recently she was seen by her primary care provider and had complained of some chest pain. This was occurring over a couple of days and noted mostly at rest however sometimes with exertion. She reports not being particularly active. She says it is since resolved and she's not had recurrence. It was not necessarily associated with eating or similar to her reflux symptoms in the past. She did not take medication for it. Her last stress test was in 2010. She does have mild coronary artery disease based on catheterization in 2007.  02/11/2016  Miranda Roman returns today for follow-up. She has no chest pain or worsening shortness of breath. In general she's done fairly well. Blood pressures been well controlled at home and was 131/84 today which she says is "high" for her. She's had no problems with atorvastatin. She takes quinapril/HCTZ. She recently had blood work through her primary care provider in a complete physical and we'll request those numbers. We are monitoring long-term for aortic insufficiency which was seen on her last echo in August 2016.  PMHx:  Past Medical History:  Diagnosis Date  .  Anemia   . Diabetes mellitus   . Hyperlipidemia   . Hypertension   . Osteoporosis   . Sickle cell anemia (HCC)     Past Surgical History:  Procedure Laterality Date  . BONE RESECTION  01/2006   wrist  . 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 TUBAL LIGATION  04/28/1977    FAMHx:  Family History  Problem Relation Age of Onset  . Diabetes Mother   . Heart disease Mother   . Diabetes Father   . Heart disease Father   . Diabetes Sister   . Diabetes Maternal Aunt   . Diabetes Maternal Uncle   . Diabetes Maternal Grandmother   . Diabetes Maternal Grandfather     SOCHx:   reports that she quit smoking about 11 years ago. She does not have any smokeless tobacco history on file. She reports that she drinks about 1.2 oz of alcohol per week . She reports that she does not use drugs.  ALLERGIES:  No Known Allergies  ROS: Pertinent items noted in HPI and remainder of comprehensive ROS otherwise negative.  HOME MEDS: Current Outpatient Prescriptions  Medication Sig Dispense Refill  . aspirin 81 MG tablet Take 81 mg by mouth daily.    [name] atorvastatin (LIPITOR) 40 MG tablet Take 40 mg by mouth daily.    . Biotin (BIOTIN 5000) 5 MG CAPS Take by mouth daily.    . calcitRIOL (ROCALTROL) 0.25 MCG capsule Take 0.25 mcg by mouth daily.     . cholecalciferol (VITAMIN D) 1000 UNITS tablet Take 1,000 Units by mouth daily.    [name]  Glucosamine-Chondroitin (GLUCOSAMINE CHONDR COMPLEX PO) Take 6 tablets by mouth daily.    . insulin detemir (LEVEMIR) 100 UNIT/ML injection Inject 10 Units into the skin at bedtime.    . Probiotic Product (PROBIOTIC ADVANCED PO) Take by mouth.    . quinapril-hydrochlorothiazide (ACCURETIC) 20-12.5 MG per tablet Take 1 tablet by mouth daily.    . traMADol (ULTRAM) 50 MG tablet      No current facility-administered medications for this visit.     LABS/IMAGING: No results found for this or any previous visit (from the past 48 hour(s)). No results found.  VITALS: BP 131/84    Pulse 86   Ht _0  (1.626 m)   Wt 176 lb 12.8 oz (80.2 kg)   BMI 30.35 kg/m   EXAM: General appearance: alert and no distress Neck: no adenopathy, no carotid bruit, no JVD, supple, symmetrical, trachea midline and thyroid not enlarged, symmetric, no tenderness/mass/nodules Lungs: clear to auscultation bilaterally Heart: regular rate and rhythm, S1, S2 normal and diastolic murmur: early diastolic 3/6, blowing at 2nd right intercostal space Abdomen: soft, non-tender; bowel sounds normal; no masses,  no organomegaly Extremities: extremities normal, atraumatic, no cyanosis or edema Pulses: 2+ and symmetric Skin: Skin color, texture, turgor normal. No rashes or lesions Neurologic: Grossly normal  EKG: Normal sinus rhythm at 86  ASSESSMENT: 1. Chest pain at rest- low risk myoview 2016 2. History of morbid obesity status post bariatric surgery 3. Diabetes, no longer insulin-dependent 4. Hypertension - managed by primary 5. 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  Miranda Roman has been asymptomatic without any significant chest pain with exertion. She had a low risk stress test last year. She will be due for repeat echo next year to follow-up on her aortic insufficiency and mild mitral regurgitation. Her blood pressure and cholesterol management primary care provider and we'll request those records to reassess her numbers. Although blood pressure today is at goal. I've encouraged exercise and activity and generally she is doing very well. Follow-up annually or sooner as necessary.  Pixie Casino, MD, [facility] Attending Cardiologist [place] 02/11/2016, 10:46 AM

## 2016-02-11 NOTE — Patient Instructions (Addendum)
Your physician wants you to follow-up in: 1 year with Dr. Debara Pickett. You will receive a reminder letter in the mail two months in advance. If you don't receive a letter, please call our office to schedule the follow-up appointment.  Your physician has requested that you have an echocardiogram in 1 year, prior to your next appointment. Echocardiography is a painless test that uses sound waves to create images of your heart. It provides your doctor with information about the size and shape of your heart and how well your heart's chambers and valves are working. This procedure takes approximately one hour. There are no restrictions for this procedure.

## 2016-02-12 ENCOUNTER — Encounter (HOSPITAL_COMMUNITY)
Admission: RE | Admit: 2016-02-12 | Discharge: 2016-02-12 | Disposition: A | Discharge status: Home / Self Care | Payer: Medicare Other | Source: Ambulatory Visit | Attending: Nephrology | Admitting: Nephrology

## 2016-02-12 DIAGNOSIS — Z79899 Other long term (current) drug therapy: Secondary | ICD-10-CM | POA: Insufficient documentation

## 2016-02-12 DIAGNOSIS — Z5181 Encounter for therapeutic drug level monitoring: Secondary | ICD-10-CM | POA: Insufficient documentation

## 2016-02-12 DIAGNOSIS — N184 Chronic kidney disease, stage 4 (severe): Secondary | ICD-10-CM | POA: Diagnosis not present

## 2016-02-12 DIAGNOSIS — D631 Anemia in chronic kidney disease: Secondary | ICD-10-CM | POA: Diagnosis not present

## 2016-02-12 LAB — IRON AND TIBC
Iron: 92 ug/dL (ref 28–170)
Saturation Ratios: 33 % — ABNORMAL HIGH (ref 10.4–31.8)
TIBC: 279 ug/dL (ref 250–450)
UIBC: 187 ug/dL

## 2016-02-12 LAB — POCT HEMOGLOBIN-HEMACUE: Hemoglobin: 10.2 g/dL — ABNORMAL LOW (ref 12.0–15.0)

## 2016-02-12 LAB — FERRITIN: Ferritin: 204 ng/mL (ref 11–307)

## 2016-02-12 MED ORDER — EPOETIN ALFA 20000 UNIT/ML IJ SOLN
INTRAMUSCULAR | Status: AC
  Filled 2016-02-12: qty 1

## 2016-02-12 MED ORDER — EPOETIN ALFA 10000 UNIT/ML IJ SOLN
10000.0000 [IU] | INTRAMUSCULAR | Status: DC
  Administered 2016-02-12: 10000 [IU] via SUBCUTANEOUS

## 2016-02-12 MED ORDER — EPOETIN ALFA 10000 UNIT/ML IJ SOLN
INTRAMUSCULAR | Status: AC
  Filled 2016-02-12: qty 1

## 2016-02-12 MED ORDER — EPOETIN ALFA 20000 UNIT/ML IJ SOLN
20000.0000 [IU] | INTRAMUSCULAR | Status: DC
  Administered 2016-02-12: 20000 [IU] via SUBCUTANEOUS

## 2016-03-04 ENCOUNTER — Encounter (HOSPITAL_COMMUNITY)
Admission: RE | Admit: 2016-03-04 | Discharge: 2016-03-04 | Disposition: A | Discharge status: Home / Self Care | Payer: Medicare Other | Source: Ambulatory Visit | Attending: Nephrology | Admitting: Nephrology

## 2016-03-04 DIAGNOSIS — Z5181 Encounter for therapeutic drug level monitoring: Secondary | ICD-10-CM | POA: Diagnosis not present

## 2016-03-04 DIAGNOSIS — N184 Chronic kidney disease, stage 4 (severe): Secondary | ICD-10-CM | POA: Diagnosis not present

## 2016-03-04 DIAGNOSIS — D631 Anemia in chronic kidney disease: Secondary | ICD-10-CM | POA: Diagnosis not present

## 2016-03-04 DIAGNOSIS — Z79899 Other long term (current) drug therapy: Secondary | ICD-10-CM | POA: Diagnosis not present

## 2016-03-04 LAB — POCT HEMOGLOBIN-HEMACUE: Hemoglobin: 10.6 g/dL — ABNORMAL LOW (ref 12.0–15.0)

## 2016-03-04 MED ORDER — EPOETIN ALFA 10000 UNIT/ML IJ SOLN
INTRAMUSCULAR | Status: AC
  Administered 2016-03-04: 10000 [IU] via SUBCUTANEOUS
  Filled 2016-03-04: qty 1

## 2016-03-04 MED ORDER — EPOETIN ALFA 20000 UNIT/ML IJ SOLN
INTRAMUSCULAR | Status: AC
Start: 2016-03-04 — End: 2016-03-04
  Administered 2016-03-04: 20000 [IU] via SUBCUTANEOUS
  Filled 2016-03-04: qty 1

## 2016-03-04 MED ORDER — EPOETIN ALFA 40000 UNIT/ML IJ SOLN
30000.0000 [IU] | Freq: Once | INTRAMUSCULAR | Status: DC
Start: 2016-03-04 — End: 2016-03-05

## 2016-03-05 ENCOUNTER — Other Ambulatory Visit: Payer: Self-pay | Admitting: Pharmacist

## 2016-03-05 NOTE — Patient Outreach (Signed)
Outreach call to Miranda Roman regarding her request for follow up from the [facility] Medication Adherence Campaign. Called and spoke with patient. HIPAA identifiers verified and verbal consent received.  Patient reports that she has been taking her atorvastatin 46m, but admits that she occasionally misses a dose. Reports that she misses a dose when she gets busy with her day. Reports that she uses a pillbox to organize her medications. Discuss strategies for helping her to remember to take her medications daily and counsel patient on the importance of medication adherence. Patient denies issues with medications cost or side effects.    Patient reports that she has no medication questions or concerns at this time. Provide patient with my phone number.  EHarlow Asa PharmD Clinical Pharmacist [facility] [phone]

## 2016-03-25 ENCOUNTER — Encounter (HOSPITAL_COMMUNITY): Payer: Medicare Other

## 2016-04-04 ENCOUNTER — Encounter (HOSPITAL_COMMUNITY)
Admission: RE | Admit: 2016-04-04 | Discharge: 2016-04-04 | Disposition: A | Discharge status: Home / Self Care | Payer: Medicare Other | Source: Ambulatory Visit | Attending: Nephrology | Admitting: Nephrology

## 2016-04-04 ENCOUNTER — Encounter (HOSPITAL_COMMUNITY): Payer: Self-pay

## 2016-04-04 DIAGNOSIS — N184 Chronic kidney disease, stage 4 (severe): Secondary | ICD-10-CM | POA: Insufficient documentation

## 2016-04-04 DIAGNOSIS — E1122 Type 2 diabetes mellitus with diabetic chronic kidney disease: Secondary | ICD-10-CM

## 2016-04-04 DIAGNOSIS — D631 Anemia in chronic kidney disease: Secondary | ICD-10-CM | POA: Insufficient documentation

## 2016-04-04 DIAGNOSIS — Z79899 Other long term (current) drug therapy: Secondary | ICD-10-CM | POA: Insufficient documentation

## 2016-04-04 DIAGNOSIS — Z5181 Encounter for therapeutic drug level monitoring: Secondary | ICD-10-CM | POA: Diagnosis not present

## 2016-04-04 LAB — IRON AND TIBC
Iron: 107 ug/dL (ref 28–170)
Saturation Ratios: 38 % — ABNORMAL HIGH (ref 10.4–31.8)
TIBC: 284 ug/dL (ref 250–450)
UIBC: 177 ug/dL

## 2016-04-04 LAB — POCT HEMOGLOBIN-HEMACUE: Hemoglobin: 10.5 g/dL — ABNORMAL LOW (ref 12.0–15.0)

## 2016-04-04 LAB — FERRITIN: Ferritin: 194 ng/mL (ref 11–307)

## 2016-04-04 MED ORDER — EPOETIN ALFA 40000 UNIT/ML IJ SOLN: 30000.0000 [IU] | INTRAMUSCULAR | Status: DC

## 2016-04-04 MED ORDER — EPOETIN ALFA 10000 UNIT/ML IJ SOLN
INTRAMUSCULAR | Status: AC
Start: 2016-04-04 — End: 2016-04-04
  Administered 2016-04-04: 10000 [IU]
  Filled 2016-04-04: qty 1

## 2016-04-04 MED ORDER — EPOETIN ALFA 20000 UNIT/ML IJ SOLN
INTRAMUSCULAR | Status: AC
  Administered 2016-04-04: 20000 [IU]
  Filled 2016-04-04: qty 1

## 2016-04-07 DIAGNOSIS — E118 Type 2 diabetes mellitus with unspecified complications: Secondary | ICD-10-CM | POA: Diagnosis not present

## 2016-04-07 DIAGNOSIS — N2581 Secondary hyperparathyroidism of renal origin: Secondary | ICD-10-CM | POA: Diagnosis not present

## 2016-04-07 DIAGNOSIS — N184 Chronic kidney disease, stage 4 (severe): Secondary | ICD-10-CM | POA: Diagnosis not present

## 2016-04-07 DIAGNOSIS — D631 Anemia in chronic kidney disease: Secondary | ICD-10-CM | POA: Diagnosis not present

## 2016-04-07 DIAGNOSIS — Z23 Encounter for immunization: Secondary | ICD-10-CM | POA: Diagnosis not present

## 2016-04-07 DIAGNOSIS — I129 Hypertensive chronic kidney disease with stage 1 through stage 4 chronic kidney disease, or unspecified chronic kidney disease: Secondary | ICD-10-CM | POA: Diagnosis not present

## 2016-04-25 ENCOUNTER — Encounter (HOSPITAL_COMMUNITY)
Admission: RE | Admit: 2016-04-25 | Discharge: 2016-04-25 | Disposition: A | Discharge status: Home / Self Care | Payer: Medicare Other | Source: Ambulatory Visit | Attending: Nephrology | Admitting: Nephrology

## 2016-04-25 DIAGNOSIS — Z79899 Other long term (current) drug therapy: Secondary | ICD-10-CM | POA: Insufficient documentation

## 2016-04-25 DIAGNOSIS — Z5181 Encounter for therapeutic drug level monitoring: Secondary | ICD-10-CM | POA: Insufficient documentation

## 2016-04-25 DIAGNOSIS — D631 Anemia in chronic kidney disease: Secondary | ICD-10-CM | POA: Diagnosis not present

## 2016-04-25 DIAGNOSIS — E1122 Type 2 diabetes mellitus with diabetic chronic kidney disease: Secondary | ICD-10-CM

## 2016-04-25 DIAGNOSIS — N184 Chronic kidney disease, stage 4 (severe): Secondary | ICD-10-CM | POA: Insufficient documentation

## 2016-04-25 LAB — POCT HEMOGLOBIN-HEMACUE: Hemoglobin: 10.1 g/dL — ABNORMAL LOW (ref 12.0–15.0)

## 2016-04-25 MED ORDER — EPOETIN ALFA 40000 UNIT/ML IJ SOLN
30000.0000 [IU] | INTRAMUSCULAR | Status: DC
  Administered 2016-04-25: 30000 [IU] via SUBCUTANEOUS

## 2016-04-25 MED ORDER — EPOETIN ALFA 10000 UNIT/ML IJ SOLN
INTRAMUSCULAR | Status: AC
  Filled 2016-04-25: qty 1

## 2016-04-25 MED ORDER — EPOETIN ALFA 20000 UNIT/ML IJ SOLN
INTRAMUSCULAR | Status: AC
  Filled 2016-04-25: qty 1

## 2016-04-28 MED FILL — Epoetin Alfa Inj 10000 Unit/ML: INTRAMUSCULAR | Qty: 1 | Status: AC

## 2016-04-28 MED FILL — Epoetin Alfa Inj 20000 Unit/ML: INTRAMUSCULAR | Qty: 1 | Status: AC

## 2016-04-30 DIAGNOSIS — H1012 Acute atopic conjunctivitis, left eye: Secondary | ICD-10-CM | POA: Diagnosis not present

## 2016-05-16 ENCOUNTER — Encounter (HOSPITAL_COMMUNITY)
Admission: RE | Admit: 2016-05-16 | Discharge: 2016-05-16 | Disposition: A | Discharge status: Home / Self Care | Payer: Medicare Other | Source: Ambulatory Visit | Attending: Nephrology | Admitting: Nephrology

## 2016-05-16 DIAGNOSIS — Z5181 Encounter for therapeutic drug level monitoring: Secondary | ICD-10-CM | POA: Diagnosis not present

## 2016-05-16 DIAGNOSIS — E1122 Type 2 diabetes mellitus with diabetic chronic kidney disease: Secondary | ICD-10-CM

## 2016-05-16 DIAGNOSIS — N184 Chronic kidney disease, stage 4 (severe): Secondary | ICD-10-CM | POA: Insufficient documentation

## 2016-05-16 DIAGNOSIS — Z79899 Other long term (current) drug therapy: Secondary | ICD-10-CM | POA: Insufficient documentation

## 2016-05-16 DIAGNOSIS — D631 Anemia in chronic kidney disease: Secondary | ICD-10-CM | POA: Diagnosis not present

## 2016-05-16 LAB — FERRITIN: Ferritin: 184 ng/mL (ref 11–307)

## 2016-05-16 LAB — IRON AND TIBC
Iron: 104 ug/dL (ref 28–170)
Saturation Ratios: 41 % — ABNORMAL HIGH (ref 10.4–31.8)
TIBC: 256 ug/dL (ref 250–450)
UIBC: 152 ug/dL

## 2016-05-16 LAB — POCT HEMOGLOBIN-HEMACUE: Hemoglobin: 10.4 g/dL — ABNORMAL LOW (ref 12.0–15.0)

## 2016-05-16 MED ORDER — EPOETIN ALFA 10000 UNIT/ML IJ SOLN
INTRAMUSCULAR | Status: AC
  Administered 2016-05-16: 10000 [IU]
  Filled 2016-05-16: qty 1

## 2016-05-16 MED ORDER — EPOETIN ALFA 40000 UNIT/ML IJ SOLN: 30000.0000 [IU] | INTRAMUSCULAR | Status: DC

## 2016-05-16 MED ORDER — EPOETIN ALFA 20000 UNIT/ML IJ SOLN
INTRAMUSCULAR | Status: AC
  Administered 2016-05-16: 20000 [IU]
  Filled 2016-05-16: qty 1

## 2016-06-06 ENCOUNTER — Encounter (HOSPITAL_COMMUNITY): Payer: Medicare Other

## 2016-06-09 DIAGNOSIS — N184 Chronic kidney disease, stage 4 (severe): Secondary | ICD-10-CM | POA: Diagnosis not present

## 2016-06-09 DIAGNOSIS — J069 Acute upper respiratory infection, unspecified: Secondary | ICD-10-CM | POA: Diagnosis not present

## 2016-06-09 DIAGNOSIS — E1122 Type 2 diabetes mellitus with diabetic chronic kidney disease: Secondary | ICD-10-CM | POA: Diagnosis not present

## 2016-06-09 DIAGNOSIS — N08 Glomerular disorders in diseases classified elsewhere: Secondary | ICD-10-CM | POA: Diagnosis not present

## 2016-06-16 ENCOUNTER — Encounter (HOSPITAL_COMMUNITY): Payer: Self-pay

## 2016-06-16 ENCOUNTER — Encounter (HOSPITAL_COMMUNITY)
Admission: RE | Admit: 2016-06-16 | Discharge: 2016-06-16 | Disposition: A | Discharge status: Home / Self Care | Payer: Medicare Other | Source: Ambulatory Visit | Attending: Nephrology | Admitting: Nephrology

## 2016-06-16 DIAGNOSIS — D631 Anemia in chronic kidney disease: Secondary | ICD-10-CM | POA: Insufficient documentation

## 2016-06-16 DIAGNOSIS — Z5181 Encounter for therapeutic drug level monitoring: Secondary | ICD-10-CM | POA: Insufficient documentation

## 2016-06-16 DIAGNOSIS — E1122 Type 2 diabetes mellitus with diabetic chronic kidney disease: Secondary | ICD-10-CM

## 2016-06-16 DIAGNOSIS — Z79899 Other long term (current) drug therapy: Secondary | ICD-10-CM | POA: Diagnosis not present

## 2016-06-16 DIAGNOSIS — N184 Chronic kidney disease, stage 4 (severe): Secondary | ICD-10-CM | POA: Diagnosis not present

## 2016-06-16 LAB — POCT HEMOGLOBIN-HEMACUE: Hemoglobin: 9.9 g/dL — ABNORMAL LOW (ref 12.0–15.0)

## 2016-06-16 MED ORDER — EPOETIN ALFA 40000 UNIT/ML IJ SOLN: 30000.0000 [IU] | INTRAMUSCULAR | Status: DC

## 2016-06-16 MED ORDER — EPOETIN ALFA 20000 UNIT/ML IJ SOLN
INTRAMUSCULAR | Status: AC
  Administered 2016-06-16: 20000 [IU]
  Filled 2016-06-16: qty 1

## 2016-06-16 MED ORDER — EPOETIN ALFA 10000 UNIT/ML IJ SOLN
INTRAMUSCULAR | Status: AC
  Administered 2016-06-16: 10000 [IU] via SUBCUTANEOUS
  Filled 2016-06-16: qty 1

## 2016-06-25 ENCOUNTER — Inpatient Hospital Stay (HOSPITAL_COMMUNITY)
Admission: RE | Admit: 2016-06-25 | Discharge: 2016-06-25 | Disposition: A | Discharge status: Home / Self Care | Payer: Medicare Other | Source: Ambulatory Visit | Attending: Nephrology | Admitting: Nephrology

## 2016-07-08 ENCOUNTER — Encounter (HOSPITAL_COMMUNITY): Payer: Medicare Other

## 2016-07-09 DIAGNOSIS — E1122 Type 2 diabetes mellitus with diabetic chronic kidney disease: Secondary | ICD-10-CM | POA: Diagnosis not present

## 2016-07-09 DIAGNOSIS — N183 Chronic kidney disease, stage 3 (moderate): Secondary | ICD-10-CM | POA: Diagnosis not present

## 2016-07-09 DIAGNOSIS — N08 Glomerular disorders in diseases classified elsewhere: Secondary | ICD-10-CM | POA: Diagnosis not present

## 2016-07-09 DIAGNOSIS — Z23 Encounter for immunization: Secondary | ICD-10-CM | POA: Diagnosis not present

## 2016-07-09 DIAGNOSIS — R238 Other skin changes: Secondary | ICD-10-CM | POA: Diagnosis not present

## 2016-07-10 DIAGNOSIS — I129 Hypertensive chronic kidney disease with stage 1 through stage 4 chronic kidney disease, or unspecified chronic kidney disease: Secondary | ICD-10-CM | POA: Diagnosis not present

## 2016-07-10 DIAGNOSIS — E118 Type 2 diabetes mellitus with unspecified complications: Secondary | ICD-10-CM | POA: Diagnosis not present

## 2016-07-10 DIAGNOSIS — N184 Chronic kidney disease, stage 4 (severe): Secondary | ICD-10-CM | POA: Diagnosis not present

## 2016-07-10 DIAGNOSIS — N2581 Secondary hyperparathyroidism of renal origin: Secondary | ICD-10-CM | POA: Diagnosis not present

## 2016-07-10 DIAGNOSIS — D631 Anemia in chronic kidney disease: Secondary | ICD-10-CM | POA: Diagnosis not present

## 2016-07-14 ENCOUNTER — Encounter (HOSPITAL_COMMUNITY)
Admission: RE | Admit: 2016-07-14 | Discharge: 2016-07-14 | Disposition: A | Discharge status: Home / Self Care | Payer: Medicare Other | Source: Ambulatory Visit | Attending: Nephrology | Admitting: Nephrology

## 2016-07-14 DIAGNOSIS — D631 Anemia in chronic kidney disease: Secondary | ICD-10-CM | POA: Diagnosis not present

## 2016-07-14 DIAGNOSIS — N184 Chronic kidney disease, stage 4 (severe): Secondary | ICD-10-CM | POA: Diagnosis not present

## 2016-07-14 DIAGNOSIS — Z79899 Other long term (current) drug therapy: Secondary | ICD-10-CM | POA: Insufficient documentation

## 2016-07-14 DIAGNOSIS — Z5181 Encounter for therapeutic drug level monitoring: Secondary | ICD-10-CM | POA: Diagnosis not present

## 2016-07-14 DIAGNOSIS — E1122 Type 2 diabetes mellitus with diabetic chronic kidney disease: Secondary | ICD-10-CM

## 2016-07-14 LAB — IRON AND TIBC
Iron: 48 ug/dL (ref 28–170)
Saturation Ratios: 17 % (ref 10.4–31.8)
TIBC: 277 ug/dL (ref 250–450)
UIBC: 229 ug/dL

## 2016-07-14 LAB — POCT HEMOGLOBIN-HEMACUE: Hemoglobin: 10.2 g/dL — ABNORMAL LOW (ref 12.0–15.0)

## 2016-07-14 LAB — FERRITIN: Ferritin: 231 ng/mL (ref 11–307)

## 2016-07-14 MED ORDER — EPOETIN ALFA 20000 UNIT/ML IJ SOLN
INTRAMUSCULAR | Status: AC
  Administered 2016-07-14: 20000 [IU]
  Filled 2016-07-14: qty 1

## 2016-07-14 MED ORDER — EPOETIN ALFA 10000 UNIT/ML IJ SOLN
INTRAMUSCULAR | Status: AC
  Administered 2016-07-14: 10000 [IU]
  Filled 2016-07-14: qty 1

## 2016-07-14 MED ORDER — EPOETIN ALFA 40000 UNIT/ML IJ SOLN: 30000.0000 [IU] | INTRAMUSCULAR | Status: DC

## 2016-08-01 ENCOUNTER — Other Ambulatory Visit (HOSPITAL_COMMUNITY): Payer: Self-pay | Admitting: *Deleted

## 2016-08-04 ENCOUNTER — Telehealth: Payer: Self-pay | Admitting: Internal Medicine

## 2016-08-04 ENCOUNTER — Encounter (HOSPITAL_COMMUNITY)
Admission: RE | Admit: 2016-08-04 | Discharge: 2016-08-04 | Disposition: A | Discharge status: Home / Self Care | Payer: Medicare Other | Source: Ambulatory Visit | Attending: Nephrology | Admitting: Nephrology

## 2016-08-04 DIAGNOSIS — E1122 Type 2 diabetes mellitus with diabetic chronic kidney disease: Secondary | ICD-10-CM

## 2016-08-04 DIAGNOSIS — D509 Iron deficiency anemia, unspecified: Secondary | ICD-10-CM | POA: Insufficient documentation

## 2016-08-04 LAB — POCT HEMOGLOBIN-HEMACUE: Hemoglobin: 10 g/dL — ABNORMAL LOW (ref 12.0–15.0)

## 2016-08-04 MED ORDER — FERUMOXYTOL INJECTION 510 MG/17 ML
510.0000 mg | Freq: Once | INTRAVENOUS | Status: AC
  Administered 2016-08-04: 510 mg via INTRAVENOUS
  Filled 2016-08-04: qty 17

## 2016-08-04 MED ORDER — EPOETIN ALFA 20000 UNIT/ML IJ SOLN
INTRAMUSCULAR | Status: AC
  Administered 2016-08-04: 20000 [IU]
  Filled 2016-08-04: qty 1

## 2016-08-04 MED ORDER — EPOETIN ALFA 40000 UNIT/ML IJ SOLN: 30000.0000 [IU] | INTRAMUSCULAR | Status: DC

## 2016-08-04 MED ORDER — EPOETIN ALFA 10000 UNIT/ML IJ SOLN
INTRAMUSCULAR | Status: AC
  Administered 2016-08-04: 10000 [IU]
  Filled 2016-08-04: qty 1

## 2016-08-04 NOTE — Telephone Encounter (Signed)
Pt have been having chest pains off and on,now they seem to be more frequent. I gave her an appointment for Friday. I told pt you would call to evaluate her.

## 2016-08-04 NOTE — Telephone Encounter (Signed)
SPOKE TO PATIENT   she states chest discomfort is fleeting - - does not last but few minutes . No other symptoms. " patient states she has had acd reflux in the past not sure if this is th problem. Offered appointment sooner - on Wednesday. Patient decide to follow up with primary tomorrow - if needed will call back for an appointment. Patient states to cancel fridays appointment appointment cancelled.

## 2016-08-07 ENCOUNTER — Ambulatory Visit: Payer: Medicare Other | Admitting: Student

## 2016-08-20 DIAGNOSIS — E1122 Type 2 diabetes mellitus with diabetic chronic kidney disease: Secondary | ICD-10-CM | POA: Diagnosis not present

## 2016-08-20 DIAGNOSIS — R079 Chest pain, unspecified: Secondary | ICD-10-CM | POA: Diagnosis not present

## 2016-08-20 DIAGNOSIS — N184 Chronic kidney disease, stage 4 (severe): Secondary | ICD-10-CM | POA: Diagnosis not present

## 2016-08-20 DIAGNOSIS — N08 Glomerular disorders in diseases classified elsewhere: Secondary | ICD-10-CM | POA: Diagnosis not present

## 2016-08-22 ENCOUNTER — Telehealth: Payer: Self-pay | Admitting: Cardiovascular Disease

## 2016-08-25 ENCOUNTER — Encounter (HOSPITAL_COMMUNITY)
Admission: RE | Admit: 2016-08-25 | Discharge: 2016-08-25 | Disposition: A | Discharge status: Home / Self Care | Payer: Medicare Other | Source: Ambulatory Visit | Attending: Nephrology | Admitting: Nephrology

## 2016-08-25 DIAGNOSIS — N184 Chronic kidney disease, stage 4 (severe): Secondary | ICD-10-CM | POA: Diagnosis not present

## 2016-08-25 DIAGNOSIS — Z79899 Other long term (current) drug therapy: Secondary | ICD-10-CM | POA: Diagnosis not present

## 2016-08-25 DIAGNOSIS — D631 Anemia in chronic kidney disease: Secondary | ICD-10-CM | POA: Diagnosis not present

## 2016-08-25 DIAGNOSIS — Z5181 Encounter for therapeutic drug level monitoring: Secondary | ICD-10-CM | POA: Insufficient documentation

## 2016-08-25 DIAGNOSIS — E1122 Type 2 diabetes mellitus with diabetic chronic kidney disease: Secondary | ICD-10-CM

## 2016-08-25 LAB — IRON AND TIBC
Iron: 85 ug/dL (ref 28–170)
Saturation Ratios: 33 % — ABNORMAL HIGH (ref 10.4–31.8)
TIBC: 256 ug/dL (ref 250–450)
UIBC: 171 ug/dL

## 2016-08-25 LAB — POCT HEMOGLOBIN-HEMACUE: Hemoglobin: 9.7 g/dL — ABNORMAL LOW (ref 12.0–15.0)

## 2016-08-25 LAB — FERRITIN: Ferritin: 484 ng/mL — ABNORMAL HIGH (ref 11–307)

## 2016-08-25 MED ORDER — EPOETIN ALFA 10000 UNIT/ML IJ SOLN
INTRAMUSCULAR | Status: AC
  Administered 2016-08-25: 10000 [IU] via SUBCUTANEOUS
  Filled 2016-08-25: qty 1

## 2016-08-25 MED ORDER — EPOETIN ALFA 40000 UNIT/ML IJ SOLN: 30000.0000 [IU] | INTRAMUSCULAR | Status: DC

## 2016-08-25 MED ORDER — EPOETIN ALFA 20000 UNIT/ML IJ SOLN
INTRAMUSCULAR | Status: AC
  Administered 2016-08-25: 20000 [IU] via SUBCUTANEOUS
  Filled 2016-08-25: qty 1

## 2016-08-26 ENCOUNTER — Ambulatory Visit (INDEPENDENT_AMBULATORY_CARE_PROVIDER_SITE_OTHER): Payer: Medicare Other | Admitting: Internal Medicine

## 2016-08-26 ENCOUNTER — Encounter: Payer: Self-pay | Admitting: Internal Medicine

## 2016-08-26 VITALS — BP 122/68 | HR 75 | Ht 64.75 in | Wt 174.0 lb

## 2016-08-26 DIAGNOSIS — R079 Chest pain, unspecified: Secondary | ICD-10-CM | POA: Diagnosis not present

## 2016-08-26 DIAGNOSIS — Z9889 Other specified postprocedural states: Secondary | ICD-10-CM | POA: Diagnosis not present

## 2016-08-26 DIAGNOSIS — K219 Gastro-esophageal reflux disease without esophagitis: Secondary | ICD-10-CM | POA: Diagnosis not present

## 2016-08-26 DIAGNOSIS — Z9884 Bariatric surgery status: Secondary | ICD-10-CM

## 2016-08-26 HISTORY — DX: Gastro-esophageal reflux disease without esophagitis: K21.9

## 2016-08-26 NOTE — Patient Instructions (Signed)
Your physician wants you to follow-up in: [place] with Dr. Debara Pickett. You will receive a reminder letter in the mail two months in advance. If you don't receive a letter, please call our office to schedule the follow-up appointment.  Your physician recommends that you continue on your current medications as directed. Please refer to the Current Medication list given to you today.

## 2016-08-26 NOTE — Progress Notes (Signed)
OFFICE NOTE  Chief Complaint:  Chest pain at rest  Primary Care Physician: [name], MD  HPI:  Miranda Roman is a 71 year old female with a history of mild coronary disease by cath in 2007 at the ostium of the left circumflex, also has insulin dependent diabetes, hypertension, dyslipidemia, and morbid obesity; however, she underwent weight loss surgery and had significant improvement in her weight. Recently she has been struggling with a sinus infection, is on some prednisone. Blood pressure was elevated a little bit today at 140/70; however, she also had not taken her blood pressure medication.   Miranda Roman returns today and is without complaints. She denies any shortness of breath, chest pain or palpitations .  I had the pleasure of seeing Miranda Roman back in the office today. Recently she was seen by her primary care provider and had complained of some chest pain. This was occurring over a couple of days and noted mostly at rest however sometimes with exertion. She reports not being particularly active. She says it is since resolved and she's not had recurrence. It was not necessarily associated with eating or similar to her reflux symptoms in the past. She did not take medication for it. Her last stress test was in 2010. She does have mild coronary artery disease based on catheterization in 2007.  02/11/2016  Miranda Roman returns today for follow-up. She has no chest pain or worsening shortness of breath. In general she's done fairly well. Blood pressures been well controlled at home and was 131/84 today which she says is "high" for her. She's had no problems with atorvastatin. She takes quinapril/HCTZ. She recently had blood work through her primary care provider in a complete physical and we'll request those numbers. We are monitoring long-term for aortic insufficiency which was seen on her last echo in August 2016.  08/26/2016  Miranda Roman returns today at the  request of Dr. Baird Cancer for reevaluation of chest pain. She has had similar pain like this in the past. She underwent nuclear stress test in 2016 which was low risk and negative for ischemia. Recently some of her symptoms seem to be worse in exaggerated by eating foods. She had been previously told that she had reflux symptoms but was not on any medications. She does have a history of gastric bypass surgery. I wonder if she could have a hiatal hernia that is acting up. She was started on omeprazole last week and notes that she's not had any further symptoms since then.  PMHx:  Past Medical History:  Diagnosis Date  . Anemia   . Diabetes mellitus   . Hyperlipidemia   . Hypertension   . Osteoporosis   . Sickle cell anemia (HCC)     Past Surgical History:  Procedure Laterality Date  . BONE RESECTION  01/2006   wrist  . 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 TUBAL LIGATION  04/28/1977    FAMHx:  Family History  Problem Relation Age of Onset  . Diabetes Mother   . Heart disease Mother   . Diabetes Father   . Heart disease Father   . Diabetes Sister   . Diabetes Maternal Aunt   . Diabetes Maternal Uncle   . Diabetes Maternal Grandmother   . Diabetes Maternal Grandfather     SOCHx:   reports that she quit smoking about 12 years ago. She has never used smokeless tobacco. She reports that she drinks about 1.2 oz of alcohol per week . She reports  that she does not use drugs.  ALLERGIES:  No Known Allergies  ROS: Pertinent items noted in HPI and remainder of comprehensive ROS otherwise negative.  HOME MEDS: Current Outpatient Prescriptions  Medication Sig Dispense Refill  . aspirin 81 MG tablet Take 81 mg by mouth daily.    [name] atorvastatin (LIPITOR) 40 MG tablet Take 40 mg by mouth daily.    . Biotin (BIOTIN 5000) 5 MG CAPS Take by mouth daily.    . calcitRIOL (ROCALTROL) 0.25 MCG capsule Take 0.25 mcg by mouth daily.     . cholecalciferol (VITAMIN D) 1000 UNITS tablet Take 1,000 Units  by mouth daily.    . Glucosamine-Chondroitin (GLUCOSAMINE CHONDR COMPLEX PO) Take 6 tablets by mouth daily.    . insulin detemir (LEVEMIR) 100 UNIT/ML injection Inject 10 Units into the skin at bedtime.    [name] omeprazole (PRILOSEC) 20 MG capsule Take 20 mg by mouth daily.    . Probiotic Product (PROBIOTIC ADVANCED PO) Take by mouth.    . quinapril-hydrochlorothiazide (ACCURETIC) 20-12.5 MG per tablet Take 1 tablet by mouth daily.    . traMADol (ULTRAM) 50 MG tablet      No current facility-administered medications for this visit.     LABS/IMAGING: Results for orders placed or performed during the hospital encounter of 08/25/16 (from the past 48 hour(s))  Iron and TIBC     Status: Abnormal   Collection Time: 08/25/16  9:14 AM  Result Value Ref Range   Iron 85 28 - 170 ug/dL   TIBC 256 250 - 450 ug/dL   Saturation Ratios 33 (H) 10.4 - 31.8 %   UIBC 171 ug/dL  Ferritin     Status: Abnormal   Collection Time: 08/25/16  9:14 AM  Result Value Ref Range   Ferritin 484 (H) 11 - 307 ng/mL  Hemoglobin-hemacue, POC     Status: Abnormal   Collection Time: 08/25/16  9:16 AM  Result Value Ref Range   Hemoglobin 9.7 (L) 12.0 - 15.0 g/dL   No results found.  VITALS: BP 122/68   Pulse 75   Ht 5' 4.75" (1.645 m)   Wt 174 lb (78.9 kg)   BMI 29.18 kg/m   EXAM: General appearance: alert and no distress Neck: no adenopathy, no carotid bruit, no JVD, supple, symmetrical, trachea midline and thyroid not enlarged, symmetric, no tenderness/mass/nodules Lungs: clear to auscultation bilaterally Heart: regular rate and rhythm, S1, S2 normal and diastolic murmur: early diastolic 3/6, blowing at 2nd right intercostal space Abdomen: soft, non-tender; bowel sounds normal; no masses,  no organomegaly Extremities: extremities normal, atraumatic, no cyanosis or edema Pulses: 2+ and symmetric Skin: Skin color, texture, turgor normal. No rashes or lesions Neurologic: Grossly normal  EKG: Normal sinus  rhythm at 75  ASSESSMENT: 1. Chest pain at rest- low risk myoview 2016, suspect GERD 2. History of morbid obesity status post bariatric surgery 3. Diabetes, no longer insulin-dependent 4. Hypertension - managed by primary 5. 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  Miranda Roman has been describing a chest pain which is worse at rest and laying down. Sometimes is worse after eating. She was recently started on omeprazole and has not had any further symptoms since then. I suspect this is the correct diagnosis of GERD. She does have a history of bariatric surgery and may have stricture or other anatomic abnormalities after surgery leading her to an increased risk of reflux. There are no signs to suggest this is unstable angina.  Her EKG is normal and symptoms are at rest and tend to be chronic on and off throughout the day.  I recommend continuing her current antiacid therapy and if her symptoms returned despite this, we could consider repeat stress testing or car to catheterization.  Pixie Casino, MD, [facility] Attending Cardiologist [place] 08/26/2016, 5:37 PM

## 2016-09-05 ENCOUNTER — Other Ambulatory Visit (HOSPITAL_COMMUNITY): Payer: Self-pay | Admitting: *Deleted

## 2016-09-08 ENCOUNTER — Encounter (HOSPITAL_COMMUNITY)
Admission: RE | Admit: 2016-09-08 | Discharge: 2016-09-08 | Disposition: A | Discharge status: Home / Self Care | Payer: Medicare Other | Source: Ambulatory Visit | Attending: Nephrology | Admitting: Nephrology

## 2016-09-08 DIAGNOSIS — Z79899 Other long term (current) drug therapy: Secondary | ICD-10-CM | POA: Insufficient documentation

## 2016-09-08 DIAGNOSIS — N184 Chronic kidney disease, stage 4 (severe): Secondary | ICD-10-CM | POA: Diagnosis not present

## 2016-09-08 DIAGNOSIS — D631 Anemia in chronic kidney disease: Secondary | ICD-10-CM | POA: Insufficient documentation

## 2016-09-08 DIAGNOSIS — Z5181 Encounter for therapeutic drug level monitoring: Secondary | ICD-10-CM | POA: Insufficient documentation

## 2016-09-08 DIAGNOSIS — E1122 Type 2 diabetes mellitus with diabetic chronic kidney disease: Secondary | ICD-10-CM

## 2016-09-08 LAB — POCT HEMOGLOBIN-HEMACUE: Hemoglobin: 10 g/dL — ABNORMAL LOW (ref 12.0–15.0)

## 2016-09-08 MED ORDER — EPOETIN ALFA 40000 UNIT/ML IJ SOLN: 30000.0000 [IU] | INTRAMUSCULAR | Status: DC

## 2016-09-08 MED ORDER — EPOETIN ALFA 10000 UNIT/ML IJ SOLN
INTRAMUSCULAR | Status: AC
  Administered 2016-09-08: 10000 [IU]
  Filled 2016-09-08: qty 1

## 2016-09-08 MED ORDER — EPOETIN ALFA 20000 UNIT/ML IJ SOLN
INTRAMUSCULAR | Status: AC
  Administered 2016-09-08: 20000 [IU]
  Filled 2016-09-08: qty 1

## 2016-09-15 ENCOUNTER — Encounter (HOSPITAL_COMMUNITY): Payer: Medicare Other

## 2016-09-16 DIAGNOSIS — E559 Vitamin D deficiency, unspecified: Secondary | ICD-10-CM | POA: Diagnosis not present

## 2016-09-16 DIAGNOSIS — Z0000 Encounter for general adult medical examination without abnormal findings: Secondary | ICD-10-CM | POA: Diagnosis not present

## 2016-09-16 DIAGNOSIS — E1122 Type 2 diabetes mellitus with diabetic chronic kidney disease: Secondary | ICD-10-CM | POA: Diagnosis not present

## 2016-09-16 DIAGNOSIS — N08 Glomerular disorders in diseases classified elsewhere: Secondary | ICD-10-CM | POA: Diagnosis not present

## 2016-09-16 DIAGNOSIS — N184 Chronic kidney disease, stage 4 (severe): Secondary | ICD-10-CM | POA: Diagnosis not present

## 2016-09-22 ENCOUNTER — Encounter (HOSPITAL_COMMUNITY)
Admission: RE | Admit: 2016-09-22 | Discharge: 2016-09-22 | Disposition: A | Discharge status: Home / Self Care | Payer: Medicare Other | Source: Ambulatory Visit | Attending: Nephrology | Admitting: Nephrology

## 2016-09-22 DIAGNOSIS — Z79899 Other long term (current) drug therapy: Secondary | ICD-10-CM | POA: Diagnosis not present

## 2016-09-22 DIAGNOSIS — N184 Chronic kidney disease, stage 4 (severe): Secondary | ICD-10-CM | POA: Diagnosis not present

## 2016-09-22 DIAGNOSIS — E1122 Type 2 diabetes mellitus with diabetic chronic kidney disease: Secondary | ICD-10-CM

## 2016-09-22 DIAGNOSIS — D631 Anemia in chronic kidney disease: Secondary | ICD-10-CM | POA: Diagnosis not present

## 2016-09-22 DIAGNOSIS — Z5181 Encounter for therapeutic drug level monitoring: Secondary | ICD-10-CM | POA: Diagnosis not present

## 2016-09-22 LAB — POCT HEMOGLOBIN-HEMACUE: Hemoglobin: 11.3 g/dL — ABNORMAL LOW (ref 12.0–15.0)

## 2016-09-22 MED ORDER — EPOETIN ALFA 10000 UNIT/ML IJ SOLN
INTRAMUSCULAR | Status: AC
  Administered 2016-09-22: 10000 [IU]
  Filled 2016-09-22: qty 1

## 2016-09-22 MED ORDER — EPOETIN ALFA 20000 UNIT/ML IJ SOLN
INTRAMUSCULAR | Status: AC
  Administered 2016-09-22: 20000 [IU]
  Filled 2016-09-22: qty 1

## 2016-09-22 MED ORDER — EPOETIN ALFA 40000 UNIT/ML IJ SOLN: 30000.0000 [IU] | INTRAMUSCULAR | Status: DC

## 2016-09-29 ENCOUNTER — Encounter: Payer: Self-pay | Admitting: Dietician

## 2016-09-29 ENCOUNTER — Encounter: Payer: Medicare Other | Attending: Internal Medicine | Admitting: Dietician

## 2016-09-29 DIAGNOSIS — Z713 Dietary counseling and surveillance: Secondary | ICD-10-CM | POA: Diagnosis not present

## 2016-09-29 DIAGNOSIS — E119 Type 2 diabetes mellitus without complications: Secondary | ICD-10-CM | POA: Diagnosis not present

## 2016-09-29 DIAGNOSIS — E1122 Type 2 diabetes mellitus with diabetic chronic kidney disease: Secondary | ICD-10-CM

## 2016-09-29 NOTE — Patient Instructions (Addendum)
Avoid eating in the bedroom.  Eat at the table most often. Eat slowly, stop when you are satisfied. Be active most days.  Aim for 30 minutes most days. Follow a low sodium diet.   Avoid adding added salt at the table.  Avoid processed meats.  Choose fresh, frozen without salt or canned without added salt.  Read labels for sodium.  Continue to ask restaurants to prepare your foods without added salt.  Never use any seasoning with potassium.  Mrs. Deliah Boston is usually fine but read the label. If your potassium is high then limit high potassium foods.   Limit high phosphorous foods as directed by your doctor.  The following foods are high in phosphorous:  whole grains, chocolate, beer, any beverage with phosphorous in the ingredients, nuts, peanut butter, dried beans.

## 2016-09-29 NOTE — Progress Notes (Signed)
Diabetes Self-Management Education  Visit Type: First/Initial  Appt. Start Time: 0930 Appt. End Time: 1100  09/29/2016  Ms. Miranda Roman, identified by name and date of birth, is a 71 y.o. female with a diagnosis of Diabetes: Type 2. Other hx includes CKD (followed by Dr. Jimmy Footman), osteoporosis, sickle cell, sickle cell retinopathy, HTN, hyperlipidemia, and gastric bypass surgery 2011.  She reports one of her main problems is that she eats when she gets bored.  She takes a jar of peanut butter to bed.  Medications include Levemir 16 units every HS.  Patient livees alone.  She is retired from [employer].  ASSESSMENT  Height _0  (1.651 m), weight 175 lb (79.4 kg). Body mass index is 29.12 kg/m.  Highest weight 255 lbs prior to gastric bypass surgery.      Diabetes Self-Management Education - 09/29/16 0941      Visit Information   Visit Type First/Initial     Initial Visit   Diabetes Type Type 2   Are you currently following a meal plan? Yes   What type of meal plan do you follow? trying to reduce salt   Are you taking your medications as prescribed? Yes   Date Diagnosed [place]   How would you rate your overall health? Good     Psychosocial Assessment   Patient Belief/Attitude about Diabetes Motivated to manage diabetes   Self-care barriers Impaired vision   Self-management support Doctor's office   Other persons present Patient   Patient Concerns Nutrition/Meal planning;Other (comment)  DM and renal   Special Needs None   Preferred Learning Style No preference indicated   Learning Readiness Ready   How often do you need to have someone help you when you read instructions, pamphlets, or other written materials from your doctor or pharmacy? 1 - Never   What is the last grade level you completed in school? 4 years college     Pre-Education Assessment   Patient understands the diabetes disease and treatment process. Needs Review   Patient  understands incorporating nutritional management into lifestyle. Needs Review   Patient undertands incorporating physical activity into lifestyle. Needs Review   Patient understands using medications safely. Demonstrates understanding / competency   Patient understands monitoring blood glucose, interpreting and using results Needs Review   Patient understands prevention, detection, and treatment of acute complications. Demonstrates understanding / competency   Patient understands prevention, detection, and treatment of chronic complications. Demonstrates understanding / competency   Patient understands how to develop strategies to address psychosocial issues. Demonstrates understanding / competency   Patient understands how to develop strategies to promote health/change behavior. Needs Review     Complications   Last HgB A1C per patient/outside source 7.2 %  09/2016   How often do you check your blood sugar? 1-2 times/day   Fasting Blood glucose range (mg/dL) 130-179   Number of hypoglycemic episodes per month 0   Number of hyperglycemic episodes per week 0   Have you had a dilated eye exam in the past 12 months? Yes   Have you had a dental exam in the past 12 months? Yes   Are you checking your feet? Yes   How many days per week are you checking your feet? 7     Dietary Intake   Breakfast [place] sausage, scrambled egg, coffee with coffeemate or flavored creamer OR LS bacon, egg, white toast, coffee OR McDonald's egg white delite or egg mcmuffin or sausage muffin  6:30-9:30   Snack (morning) fruit or nuts occasionally   Lunch burger and fries OR homemade soup or from [employer] or [employer] OR salad OR [employer] liver and onions  1-3 (tries to wait at at least 4 hours after breakfast)   Snack (afternoon) fruit (fresh or dried)   Dinner fried fish or grilled or fried pork chop or steak or salmon with potatoes or corn or rice or pasta, vegetable (greens or spinach or cabbage or peas or other) OR  dried beans and rice  6-9   Snack (evening) peanut butter OR occasional peanut butter, jelly and bread   Beverage(s) water, unsweetened iced tea (bottled), crystal lite, coffee     Exercise   Exercise Type Light (walking / raking leaves)  joined [employer], slilver sneakers class   How many days per week to you exercise? 3   How many minutes per day do you exercise? 40   Total minutes per week of exercise 120     Patient Education   Previous Diabetes Education Yes (please comment)   Disease state  Other (comment)  insulin resistance   Nutrition management  Role of diet in the treatment of diabetes and the relationship between the three main macronutrients and blood glucose level;Food label reading, portion sizes and measuring food.;Carbohydrate counting;Meal options for control of blood glucose level and chronic complications.;Information on hints to eating out and maintain blood glucose control.;Other (comment)  low sodium, low phosphorus, low potassium   Physical activity and exercise  Role of exercise on diabetes management, blood pressure control and cardiac health.   Monitoring Identified appropriate SMBG and/or A1C goals.   Chronic complications Relationship between chronic complications and blood glucose control   Psychosocial adjustment Role of stress on diabetes;Worked with patient to identify barriers to care and solutions;Identified and addressed patients feelings and concerns about diabetes     Individualized Goals (developed by patient)   Physical Activity Exercise 3-5 times per week;30 minutes per day   Medications take my medication as prescribed   Monitoring  test my blood glucose as discussed   Reducing Risk examine blood glucose patterns;get labs drawn   Health Coping discuss diabetes with (comment)  MD/RD     Post-Education Assessment   Patient understands the diabetes disease and treatment process. Demonstrates understanding / competency   Patient understands  incorporating nutritional management into lifestyle. Demonstrates understanding / competency   Patient undertands incorporating physical activity into lifestyle. Demonstrates understanding / competency   Patient understands using medications safely. Demonstrates understanding / competency   Patient understands monitoring blood glucose, interpreting and using results Demonstrates understanding / competency   Patient understands prevention, detection, and treatment of acute complications. Demonstrates understanding / competency   Patient understands prevention, detection, and treatment of chronic complications. Demonstrates understanding / competency   Patient understands how to develop strategies to address psychosocial issues. Demonstrates understanding / competency   Patient understands how to develop strategies to promote health/change behavior. Demonstrates understanding / competency     Outcomes   Expected Outcomes Demonstrated interest in learning. Expect positive outcomes   Future DMSE PRN   Program Status Completed      Individualized Plan for Diabetes Self-Management Training:   Learning Objective:  Patient will have a greater understanding of diabetes self-management. Patient education plan is to attend individual and/or group sessions per assessed needs and concerns.   Plan:   Patient Instructions  Avoid eating in the bedroom.  Eat at the table most often. Eat  slowly, stop when you are satisfied. Be active most days.  Aim for 30 minutes most days. Follow a low sodium diet.   Avoid adding added salt at the table.  Avoid processed meats.  Choose fresh, frozen without salt or canned without added salt.  Read labels for sodium.  Continue to ask restaurants to prepare your foods without added salt.  Never use any seasoning with potassium.  Mrs. Miranda Roman is usually fine but read the label. If your potassium is high then limit high potassium foods.   Limit high phosphorous foods as  directed by your doctor.  The following foods are high in phosphorous:  whole grains, chocolate, beer, any beverage with phosphorous in the ingredients, nuts, peanut butter, dried beans.     Expected Outcomes:  Demonstrated interest in learning. Expect positive outcomes  Education material provided: Living Well with Diabetes and Food label handouts, Abbott kidney pyramid, information on the [employer] and [employer].  If problems or questions, patient to contact team via:  Phone and Email  Future DSME appointment: PRN

## 2016-10-06 ENCOUNTER — Encounter (HOSPITAL_COMMUNITY)
Admission: RE | Admit: 2016-10-06 | Discharge: 2016-10-06 | Disposition: A | Discharge status: Home / Self Care | Payer: Medicare Other | Source: Ambulatory Visit | Attending: Nephrology | Admitting: Nephrology

## 2016-10-06 ENCOUNTER — Encounter (HOSPITAL_COMMUNITY): Payer: Medicare Other

## 2016-10-06 DIAGNOSIS — N184 Chronic kidney disease, stage 4 (severe): Secondary | ICD-10-CM | POA: Diagnosis not present

## 2016-10-06 DIAGNOSIS — Z79899 Other long term (current) drug therapy: Secondary | ICD-10-CM | POA: Insufficient documentation

## 2016-10-06 DIAGNOSIS — E1122 Type 2 diabetes mellitus with diabetic chronic kidney disease: Secondary | ICD-10-CM

## 2016-10-06 DIAGNOSIS — D631 Anemia in chronic kidney disease: Secondary | ICD-10-CM | POA: Diagnosis not present

## 2016-10-06 DIAGNOSIS — Z5181 Encounter for therapeutic drug level monitoring: Secondary | ICD-10-CM | POA: Insufficient documentation

## 2016-10-06 LAB — IRON AND TIBC
Iron: 96 ug/dL (ref 28–170)
Saturation Ratios: 37 % — ABNORMAL HIGH (ref 10.4–31.8)
TIBC: 262 ug/dL (ref 250–450)
UIBC: 166 ug/dL

## 2016-10-06 LAB — POCT HEMOGLOBIN-HEMACUE: Hemoglobin: 11.5 g/dL — ABNORMAL LOW (ref 12.0–15.0)

## 2016-10-06 LAB — FERRITIN: Ferritin: 280 ng/mL (ref 11–307)

## 2016-10-06 MED ORDER — EPOETIN ALFA 20000 UNIT/ML IJ SOLN
INTRAMUSCULAR | Status: AC
  Administered 2016-10-06: 20000 [IU] via SUBCUTANEOUS
  Filled 2016-10-06: qty 1

## 2016-10-06 MED ORDER — EPOETIN ALFA 10000 UNIT/ML IJ SOLN
INTRAMUSCULAR | Status: AC
  Administered 2016-10-06: 10000 [IU] via SUBCUTANEOUS
  Filled 2016-10-06: qty 1

## 2016-10-06 MED ORDER — EPOETIN ALFA 40000 UNIT/ML IJ SOLN: 30000.0000 [IU] | INTRAMUSCULAR | Status: DC

## 2016-10-10 ENCOUNTER — Encounter (HOSPITAL_COMMUNITY): Payer: Medicare Other

## 2016-10-14 ENCOUNTER — Other Ambulatory Visit: Payer: Self-pay | Admitting: Nephrology

## 2016-10-14 ENCOUNTER — Ambulatory Visit
Admission: RE | Admit: 2016-10-14 | Discharge: 2016-10-14 | Disposition: A | Discharge status: Home / Self Care | Payer: Medicare Other | Source: Ambulatory Visit | Attending: Nephrology | Admitting: Nephrology

## 2016-10-14 DIAGNOSIS — N184 Chronic kidney disease, stage 4 (severe): Secondary | ICD-10-CM | POA: Diagnosis not present

## 2016-10-14 DIAGNOSIS — D631 Anemia in chronic kidney disease: Secondary | ICD-10-CM | POA: Diagnosis not present

## 2016-10-14 DIAGNOSIS — I129 Hypertensive chronic kidney disease with stage 1 through stage 4 chronic kidney disease, or unspecified chronic kidney disease: Secondary | ICD-10-CM | POA: Diagnosis not present

## 2016-10-14 DIAGNOSIS — R079 Chest pain, unspecified: Secondary | ICD-10-CM

## 2016-10-14 DIAGNOSIS — R0602 Shortness of breath: Secondary | ICD-10-CM | POA: Diagnosis not present

## 2016-10-14 DIAGNOSIS — E118 Type 2 diabetes mellitus with unspecified complications: Secondary | ICD-10-CM | POA: Diagnosis not present

## 2016-10-14 DIAGNOSIS — N2581 Secondary hyperparathyroidism of renal origin: Secondary | ICD-10-CM | POA: Diagnosis not present

## 2016-10-20 ENCOUNTER — Encounter (HOSPITAL_COMMUNITY)
Admission: RE | Admit: 2016-10-20 | Discharge: 2016-10-20 | Disposition: A | Discharge status: Home / Self Care | Payer: Medicare Other | Source: Ambulatory Visit | Attending: Nephrology | Admitting: Nephrology

## 2016-10-20 DIAGNOSIS — Z5181 Encounter for therapeutic drug level monitoring: Secondary | ICD-10-CM | POA: Diagnosis not present

## 2016-10-20 DIAGNOSIS — N184 Chronic kidney disease, stage 4 (severe): Secondary | ICD-10-CM | POA: Diagnosis not present

## 2016-10-20 DIAGNOSIS — Z79899 Other long term (current) drug therapy: Secondary | ICD-10-CM | POA: Diagnosis not present

## 2016-10-20 DIAGNOSIS — D631 Anemia in chronic kidney disease: Secondary | ICD-10-CM | POA: Diagnosis not present

## 2016-10-20 DIAGNOSIS — E1122 Type 2 diabetes mellitus with diabetic chronic kidney disease: Secondary | ICD-10-CM

## 2016-10-20 LAB — POCT HEMOGLOBIN-HEMACUE: Hemoglobin: 11.1 g/dL — ABNORMAL LOW (ref 12.0–15.0)

## 2016-10-20 MED ORDER — EPOETIN ALFA 40000 UNIT/ML IJ SOLN: 30000.0000 [IU] | INTRAMUSCULAR | Status: DC

## 2016-10-20 MED ORDER — EPOETIN ALFA 10000 UNIT/ML IJ SOLN
INTRAMUSCULAR | Status: AC
  Administered 2016-10-20: 10000 [IU] via SUBCUTANEOUS
  Filled 2016-10-20: qty 1

## 2016-10-20 MED ORDER — EPOETIN ALFA 20000 UNIT/ML IJ SOLN
INTRAMUSCULAR | Status: AC
  Administered 2016-10-20: 20000 [IU] via SUBCUTANEOUS
  Filled 2016-10-20: qty 1

## 2016-10-31 ENCOUNTER — Other Ambulatory Visit (HOSPITAL_COMMUNITY): Payer: Self-pay | Admitting: *Deleted

## 2016-11-03 ENCOUNTER — Encounter (HOSPITAL_COMMUNITY)
Admission: RE | Admit: 2016-11-03 | Discharge: 2016-11-03 | Disposition: A | Discharge status: Home / Self Care | Payer: Medicare Other | Source: Ambulatory Visit | Attending: Nephrology | Admitting: Nephrology

## 2016-11-03 DIAGNOSIS — Z5181 Encounter for therapeutic drug level monitoring: Secondary | ICD-10-CM | POA: Diagnosis not present

## 2016-11-03 DIAGNOSIS — D631 Anemia in chronic kidney disease: Secondary | ICD-10-CM | POA: Diagnosis not present

## 2016-11-03 DIAGNOSIS — E1122 Type 2 diabetes mellitus with diabetic chronic kidney disease: Secondary | ICD-10-CM

## 2016-11-03 DIAGNOSIS — N184 Chronic kidney disease, stage 4 (severe): Secondary | ICD-10-CM | POA: Diagnosis not present

## 2016-11-03 DIAGNOSIS — Z79899 Other long term (current) drug therapy: Secondary | ICD-10-CM | POA: Diagnosis not present

## 2016-11-03 LAB — POCT HEMOGLOBIN-HEMACUE: Hemoglobin: 11.6 g/dL — ABNORMAL LOW (ref 12.0–15.0)

## 2016-11-03 MED ORDER — EPOETIN ALFA 10000 UNIT/ML IJ SOLN
INTRAMUSCULAR | Status: AC
  Administered 2016-11-03: 10000 [IU] via SUBCUTANEOUS
  Filled 2016-11-03: qty 1

## 2016-11-03 MED ORDER — EPOETIN ALFA 20000 UNIT/ML IJ SOLN
INTRAMUSCULAR | Status: AC
  Administered 2016-11-03: 20000 [IU] via SUBCUTANEOUS
  Filled 2016-11-03: qty 1

## 2016-11-03 MED ORDER — EPOETIN ALFA 40000 UNIT/ML IJ SOLN: 30000.0000 [IU] | INTRAMUSCULAR | Status: DC

## 2016-11-17 ENCOUNTER — Encounter (HOSPITAL_COMMUNITY)
Admission: RE | Admit: 2016-11-17 | Discharge: 2016-11-17 | Disposition: A | Discharge status: Home / Self Care | Payer: Medicare Other | Source: Ambulatory Visit | Attending: Nephrology | Admitting: Nephrology

## 2016-11-17 DIAGNOSIS — Z79899 Other long term (current) drug therapy: Secondary | ICD-10-CM | POA: Insufficient documentation

## 2016-11-17 DIAGNOSIS — D631 Anemia in chronic kidney disease: Secondary | ICD-10-CM | POA: Insufficient documentation

## 2016-11-17 DIAGNOSIS — Z5181 Encounter for therapeutic drug level monitoring: Secondary | ICD-10-CM | POA: Insufficient documentation

## 2016-11-17 DIAGNOSIS — N184 Chronic kidney disease, stage 4 (severe): Secondary | ICD-10-CM | POA: Diagnosis not present

## 2016-11-17 DIAGNOSIS — E1122 Type 2 diabetes mellitus with diabetic chronic kidney disease: Secondary | ICD-10-CM

## 2016-11-17 LAB — IRON AND TIBC
Iron: 93 ug/dL (ref 28–170)
Saturation Ratios: 38 % — ABNORMAL HIGH (ref 10.4–31.8)
TIBC: 245 ug/dL — ABNORMAL LOW (ref 250–450)
UIBC: 152 ug/dL

## 2016-11-17 LAB — POCT HEMOGLOBIN-HEMACUE: Hemoglobin: 12 g/dL (ref 12.0–15.0)

## 2016-11-17 LAB — FERRITIN: Ferritin: 163 ng/mL (ref 11–307)

## 2016-11-17 MED ORDER — EPOETIN ALFA 40000 UNIT/ML IJ SOLN: 30000.0000 [IU] | INTRAMUSCULAR | Status: DC

## 2016-11-25 NOTE — Telephone Encounter (Signed)
discard

## 2016-12-02 ENCOUNTER — Encounter (HOSPITAL_COMMUNITY)
Admission: RE | Admit: 2016-12-02 | Discharge: 2016-12-02 | Disposition: A | Discharge status: Home / Self Care | Payer: Medicare Other | Source: Ambulatory Visit | Attending: Nephrology | Admitting: Nephrology

## 2016-12-02 DIAGNOSIS — E1122 Type 2 diabetes mellitus with diabetic chronic kidney disease: Secondary | ICD-10-CM

## 2016-12-02 DIAGNOSIS — N184 Chronic kidney disease, stage 4 (severe): Secondary | ICD-10-CM | POA: Diagnosis not present

## 2016-12-02 DIAGNOSIS — D631 Anemia in chronic kidney disease: Secondary | ICD-10-CM | POA: Diagnosis not present

## 2016-12-02 DIAGNOSIS — Z79899 Other long term (current) drug therapy: Secondary | ICD-10-CM | POA: Diagnosis not present

## 2016-12-02 DIAGNOSIS — Z5181 Encounter for therapeutic drug level monitoring: Secondary | ICD-10-CM | POA: Diagnosis not present

## 2016-12-02 LAB — POCT HEMOGLOBIN-HEMACUE: Hemoglobin: 9.8 g/dL — ABNORMAL LOW (ref 12.0–15.0)

## 2016-12-02 MED ORDER — EPOETIN ALFA 20000 UNIT/ML IJ SOLN
INTRAMUSCULAR | Status: AC
  Administered 2016-12-02: 20000 [IU] via SUBCUTANEOUS
  Filled 2016-12-02: qty 1

## 2016-12-02 MED ORDER — EPOETIN ALFA 40000 UNIT/ML IJ SOLN
30000.0000 [IU] | INTRAMUSCULAR | Status: DC
Start: 2016-12-02 — End: 2016-12-03

## 2016-12-02 MED ORDER — EPOETIN ALFA 10000 UNIT/ML IJ SOLN
INTRAMUSCULAR | Status: AC
  Administered 2016-12-02: 10000 [IU] via SUBCUTANEOUS
  Filled 2016-12-02: qty 1

## 2016-12-15 ENCOUNTER — Encounter (HOSPITAL_COMMUNITY): Payer: Medicare Other

## 2016-12-22 ENCOUNTER — Encounter (HOSPITAL_COMMUNITY)
Admission: RE | Admit: 2016-12-22 | Discharge: 2016-12-22 | Disposition: A | Discharge status: Home / Self Care | Payer: Medicare Other | Source: Ambulatory Visit | Attending: Nephrology | Admitting: Nephrology

## 2016-12-22 DIAGNOSIS — D631 Anemia in chronic kidney disease: Secondary | ICD-10-CM | POA: Diagnosis not present

## 2016-12-22 DIAGNOSIS — Z79899 Other long term (current) drug therapy: Secondary | ICD-10-CM | POA: Diagnosis not present

## 2016-12-22 DIAGNOSIS — N184 Chronic kidney disease, stage 4 (severe): Secondary | ICD-10-CM | POA: Insufficient documentation

## 2016-12-22 DIAGNOSIS — Z5181 Encounter for therapeutic drug level monitoring: Secondary | ICD-10-CM | POA: Insufficient documentation

## 2016-12-22 DIAGNOSIS — E1122 Type 2 diabetes mellitus with diabetic chronic kidney disease: Secondary | ICD-10-CM

## 2016-12-22 LAB — POCT HEMOGLOBIN-HEMACUE: Hemoglobin: 10.4 g/dL — ABNORMAL LOW (ref 12.0–15.0)

## 2016-12-22 MED ORDER — EPOETIN ALFA 10000 UNIT/ML IJ SOLN
INTRAMUSCULAR | Status: AC
  Administered 2016-12-22: 10000 [IU] via SUBCUTANEOUS
  Filled 2016-12-22: qty 1

## 2016-12-22 MED ORDER — EPOETIN ALFA 40000 UNIT/ML IJ SOLN: 30000.0000 [IU] | INTRAMUSCULAR | Status: DC

## 2016-12-22 MED ORDER — EPOETIN ALFA 20000 UNIT/ML IJ SOLN
INTRAMUSCULAR | Status: AC
  Administered 2016-12-22: 20000 [IU] via SUBCUTANEOUS
  Filled 2016-12-22: qty 1

## 2016-12-25 ENCOUNTER — Other Ambulatory Visit: Payer: Self-pay | Admitting: Internal Medicine

## 2016-12-25 DIAGNOSIS — Z1231 Encounter for screening mammogram for malignant neoplasm of breast: Secondary | ICD-10-CM

## 2017-01-05 ENCOUNTER — Encounter (HOSPITAL_COMMUNITY)
Admission: RE | Admit: 2017-01-05 | Discharge: 2017-01-05 | Disposition: A | Discharge status: Home / Self Care | Payer: Medicare Other | Source: Ambulatory Visit | Attending: Nephrology | Admitting: Nephrology

## 2017-01-05 DIAGNOSIS — N184 Chronic kidney disease, stage 4 (severe): Secondary | ICD-10-CM | POA: Insufficient documentation

## 2017-01-05 DIAGNOSIS — E1122 Type 2 diabetes mellitus with diabetic chronic kidney disease: Secondary | ICD-10-CM

## 2017-01-05 DIAGNOSIS — Z5181 Encounter for therapeutic drug level monitoring: Secondary | ICD-10-CM | POA: Insufficient documentation

## 2017-01-05 DIAGNOSIS — Z79899 Other long term (current) drug therapy: Secondary | ICD-10-CM | POA: Diagnosis not present

## 2017-01-05 DIAGNOSIS — D631 Anemia in chronic kidney disease: Secondary | ICD-10-CM | POA: Diagnosis not present

## 2017-01-05 LAB — IRON AND TIBC
Iron: 88 ug/dL (ref 28–170)
Saturation Ratios: 36 % — ABNORMAL HIGH (ref 10.4–31.8)
TIBC: 246 ug/dL — ABNORMAL LOW (ref 250–450)
UIBC: 158 ug/dL

## 2017-01-05 LAB — POCT HEMOGLOBIN-HEMACUE: Hemoglobin: 10.6 g/dL — ABNORMAL LOW (ref 12.0–15.0)

## 2017-01-05 LAB — FERRITIN: Ferritin: 196 ng/mL (ref 11–307)

## 2017-01-05 MED ORDER — EPOETIN ALFA 40000 UNIT/ML IJ SOLN: 30000.0000 [IU] | INTRAMUSCULAR | Status: DC

## 2017-01-05 MED ORDER — EPOETIN ALFA 20000 UNIT/ML IJ SOLN
INTRAMUSCULAR | Status: AC
  Administered 2017-01-05: 20000 [IU] via SUBCUTANEOUS
  Filled 2017-01-05: qty 1

## 2017-01-05 MED ORDER — EPOETIN ALFA 10000 UNIT/ML IJ SOLN
INTRAMUSCULAR | Status: AC
  Administered 2017-01-05: 10000 [IU] via SUBCUTANEOUS
  Filled 2017-01-05: qty 1

## 2017-01-08 ENCOUNTER — Ambulatory Visit
Admission: RE | Admit: 2017-01-08 | Discharge: 2017-01-08 | Disposition: A | Discharge status: Home / Self Care | Payer: Medicare Other | Source: Ambulatory Visit | Attending: Internal Medicine | Admitting: Internal Medicine

## 2017-01-08 DIAGNOSIS — Z1231 Encounter for screening mammogram for malignant neoplasm of breast: Secondary | ICD-10-CM

## 2017-01-19 ENCOUNTER — Encounter (HOSPITAL_COMMUNITY)
Admission: RE | Admit: 2017-01-19 | Discharge: 2017-01-19 | Disposition: A | Discharge status: Home / Self Care | Payer: Medicare Other | Source: Ambulatory Visit | Attending: Nephrology | Admitting: Nephrology

## 2017-01-19 DIAGNOSIS — D631 Anemia in chronic kidney disease: Secondary | ICD-10-CM | POA: Diagnosis not present

## 2017-01-19 DIAGNOSIS — Z5181 Encounter for therapeutic drug level monitoring: Secondary | ICD-10-CM | POA: Diagnosis not present

## 2017-01-19 DIAGNOSIS — E1122 Type 2 diabetes mellitus with diabetic chronic kidney disease: Secondary | ICD-10-CM

## 2017-01-19 DIAGNOSIS — Z79899 Other long term (current) drug therapy: Secondary | ICD-10-CM | POA: Diagnosis not present

## 2017-01-19 DIAGNOSIS — N184 Chronic kidney disease, stage 4 (severe): Secondary | ICD-10-CM | POA: Diagnosis not present

## 2017-01-19 LAB — POCT HEMOGLOBIN-HEMACUE: Hemoglobin: 10.8 g/dL — ABNORMAL LOW (ref 12.0–15.0)

## 2017-01-19 MED ORDER — EPOETIN ALFA 20000 UNIT/ML IJ SOLN
INTRAMUSCULAR | Status: AC
Start: 2017-01-19 — End: 2017-01-19
  Administered 2017-01-19: 20000 [IU] via SUBCUTANEOUS
  Filled 2017-01-19: qty 1

## 2017-01-19 MED ORDER — EPOETIN ALFA 40000 UNIT/ML IJ SOLN
30000.0000 [IU] | INTRAMUSCULAR | Status: DC
Start: 2017-01-19 — End: 2017-01-20

## 2017-01-19 MED ORDER — EPOETIN ALFA 10000 UNIT/ML IJ SOLN
INTRAMUSCULAR | Status: AC
Start: 2017-01-19 — End: 2017-01-19
  Administered 2017-01-19: 10000 [IU] via SUBCUTANEOUS
  Filled 2017-01-19: qty 1

## 2017-02-02 ENCOUNTER — Encounter (HOSPITAL_COMMUNITY)
Admission: RE | Admit: 2017-02-02 | Discharge: 2017-02-02 | Disposition: A | Discharge status: Home / Self Care | Payer: Medicare Other | Source: Ambulatory Visit | Attending: Nephrology | Admitting: Nephrology

## 2017-02-02 DIAGNOSIS — N184 Chronic kidney disease, stage 4 (severe): Secondary | ICD-10-CM | POA: Diagnosis not present

## 2017-02-02 DIAGNOSIS — D631 Anemia in chronic kidney disease: Secondary | ICD-10-CM | POA: Diagnosis not present

## 2017-02-02 DIAGNOSIS — Z5181 Encounter for therapeutic drug level monitoring: Secondary | ICD-10-CM | POA: Diagnosis not present

## 2017-02-02 DIAGNOSIS — E1122 Type 2 diabetes mellitus with diabetic chronic kidney disease: Secondary | ICD-10-CM

## 2017-02-02 DIAGNOSIS — Z79899 Other long term (current) drug therapy: Secondary | ICD-10-CM | POA: Diagnosis not present

## 2017-02-02 LAB — POCT HEMOGLOBIN-HEMACUE: Hemoglobin: 11 g/dL — ABNORMAL LOW (ref 12.0–15.0)

## 2017-02-02 MED ORDER — EPOETIN ALFA 10000 UNIT/ML IJ SOLN
INTRAMUSCULAR | Status: AC
  Administered 2017-02-02: 10000 [IU] via SUBCUTANEOUS
  Filled 2017-02-02: qty 1

## 2017-02-02 MED ORDER — EPOETIN ALFA 20000 UNIT/ML IJ SOLN
INTRAMUSCULAR | Status: AC
  Administered 2017-02-02: 20000 [IU] via SUBCUTANEOUS
  Filled 2017-02-02: qty 1

## 2017-02-02 MED ORDER — EPOETIN ALFA 40000 UNIT/ML IJ SOLN: 30000.0000 [IU] | INTRAMUSCULAR | Status: DC

## 2017-02-03 DIAGNOSIS — N184 Chronic kidney disease, stage 4 (severe): Secondary | ICD-10-CM | POA: Diagnosis not present

## 2017-02-03 DIAGNOSIS — D631 Anemia in chronic kidney disease: Secondary | ICD-10-CM | POA: Diagnosis not present

## 2017-02-03 DIAGNOSIS — E118 Type 2 diabetes mellitus with unspecified complications: Secondary | ICD-10-CM | POA: Diagnosis not present

## 2017-02-03 DIAGNOSIS — N2581 Secondary hyperparathyroidism of renal origin: Secondary | ICD-10-CM | POA: Diagnosis not present

## 2017-02-03 DIAGNOSIS — I129 Hypertensive chronic kidney disease with stage 1 through stage 4 chronic kidney disease, or unspecified chronic kidney disease: Secondary | ICD-10-CM | POA: Diagnosis not present

## 2017-02-05 DIAGNOSIS — M810 Age-related osteoporosis without current pathological fracture: Secondary | ICD-10-CM | POA: Diagnosis not present

## 2017-02-05 DIAGNOSIS — N870 Mild cervical dysplasia: Secondary | ICD-10-CM | POA: Diagnosis not present

## 2017-02-05 DIAGNOSIS — Z01411 Encounter for gynecological examination (general) (routine) with abnormal findings: Secondary | ICD-10-CM | POA: Diagnosis not present

## 2017-02-06 DIAGNOSIS — N2581 Secondary hyperparathyroidism of renal origin: Secondary | ICD-10-CM | POA: Diagnosis not present

## 2017-02-06 DIAGNOSIS — N184 Chronic kidney disease, stage 4 (severe): Secondary | ICD-10-CM | POA: Diagnosis not present

## 2017-02-12 ENCOUNTER — Ambulatory Visit (HOSPITAL_COMMUNITY): Payer: Medicare Other | Attending: Cardiology

## 2017-02-12 ENCOUNTER — Other Ambulatory Visit: Payer: Self-pay

## 2017-02-12 DIAGNOSIS — I351 Nonrheumatic aortic (valve) insufficiency: Secondary | ICD-10-CM

## 2017-02-12 DIAGNOSIS — I088 Other rheumatic multiple valve diseases: Secondary | ICD-10-CM | POA: Insufficient documentation

## 2017-02-12 DIAGNOSIS — Z794 Long term (current) use of insulin: Secondary | ICD-10-CM | POA: Diagnosis not present

## 2017-02-12 DIAGNOSIS — I5030 Unspecified diastolic (congestive) heart failure: Secondary | ICD-10-CM | POA: Diagnosis not present

## 2017-02-12 DIAGNOSIS — H524 Presbyopia: Secondary | ICD-10-CM | POA: Diagnosis not present

## 2017-02-12 DIAGNOSIS — I519 Heart disease, unspecified: Secondary | ICD-10-CM

## 2017-02-12 DIAGNOSIS — I5189 Other ill-defined heart diseases: Secondary | ICD-10-CM

## 2017-02-12 DIAGNOSIS — H2513 Age-related nuclear cataract, bilateral: Secondary | ICD-10-CM | POA: Diagnosis not present

## 2017-02-12 DIAGNOSIS — E119 Type 2 diabetes mellitus without complications: Secondary | ICD-10-CM | POA: Diagnosis not present

## 2017-02-13 ENCOUNTER — Other Ambulatory Visit (HOSPITAL_COMMUNITY): Payer: Self-pay

## 2017-02-16 ENCOUNTER — Encounter (HOSPITAL_COMMUNITY)
Admission: RE | Admit: 2017-02-16 | Discharge: 2017-02-16 | Disposition: A | Discharge status: Home / Self Care | Payer: Medicare Other | Source: Ambulatory Visit | Attending: Nephrology | Admitting: Nephrology

## 2017-02-16 DIAGNOSIS — D631 Anemia in chronic kidney disease: Secondary | ICD-10-CM | POA: Insufficient documentation

## 2017-02-16 DIAGNOSIS — Z79899 Other long term (current) drug therapy: Secondary | ICD-10-CM | POA: Diagnosis not present

## 2017-02-16 DIAGNOSIS — Z5181 Encounter for therapeutic drug level monitoring: Secondary | ICD-10-CM | POA: Diagnosis not present

## 2017-02-16 DIAGNOSIS — N184 Chronic kidney disease, stage 4 (severe): Secondary | ICD-10-CM | POA: Diagnosis not present

## 2017-02-16 DIAGNOSIS — E1122 Type 2 diabetes mellitus with diabetic chronic kidney disease: Secondary | ICD-10-CM

## 2017-02-16 LAB — FERRITIN: Ferritin: 183 ng/mL (ref 11–307)

## 2017-02-16 LAB — IRON AND TIBC
Iron: 72 ug/dL (ref 28–170)
Saturation Ratios: 29 % (ref 10.4–31.8)
TIBC: 251 ug/dL (ref 250–450)
UIBC: 179 ug/dL

## 2017-02-16 LAB — POCT HEMOGLOBIN-HEMACUE: Hemoglobin: 10.6 g/dL — ABNORMAL LOW (ref 12.0–15.0)

## 2017-02-16 MED ORDER — EPOETIN ALFA 10000 UNIT/ML IJ SOLN
INTRAMUSCULAR | Status: AC
  Administered 2017-02-16: 10000 [IU] via SUBCUTANEOUS
  Filled 2017-02-16: qty 1

## 2017-02-16 MED ORDER — EPOETIN ALFA 40000 UNIT/ML IJ SOLN: 30000.0000 [IU] | INTRAMUSCULAR | Status: DC

## 2017-02-16 MED ORDER — EPOETIN ALFA 20000 UNIT/ML IJ SOLN
INTRAMUSCULAR | Status: AC
  Administered 2017-02-16: 20000 [IU] via SUBCUTANEOUS
  Filled 2017-02-16: qty 1

## 2017-02-24 ENCOUNTER — Ambulatory Visit (INDEPENDENT_AMBULATORY_CARE_PROVIDER_SITE_OTHER): Payer: Medicare Other | Admitting: Internal Medicine

## 2017-02-24 ENCOUNTER — Encounter: Payer: Self-pay | Admitting: Internal Medicine

## 2017-02-24 VITALS — BP 118/70 | HR 75 | Ht 65.0 in | Wt 178.0 lb

## 2017-02-24 DIAGNOSIS — I2583 Coronary atherosclerosis due to lipid rich plaque: Secondary | ICD-10-CM | POA: Diagnosis not present

## 2017-02-24 DIAGNOSIS — I351 Nonrheumatic aortic (valve) insufficiency: Secondary | ICD-10-CM | POA: Diagnosis not present

## 2017-02-24 DIAGNOSIS — I10 Essential (primary) hypertension: Secondary | ICD-10-CM

## 2017-02-24 DIAGNOSIS — I2510 Atherosclerotic heart disease of native coronary artery without angina pectoris: Secondary | ICD-10-CM

## 2017-02-24 NOTE — Progress Notes (Signed)
OFFICE NOTE  Chief Complaint:  No complaints  Primary Care Physician: Glendale Chard, MD  HPI:  Miranda Roman is a 71 year old female with a history of mild coronary disease by cath in 2007 at the ostium of the left circumflex, also has insulin dependent diabetes, hypertension, dyslipidemia, and morbid obesity; however, she underwent weight loss surgery and had significant improvement in her weight. Recently she has been struggling with a sinus infection, is on some prednisone. Blood pressure was elevated a little bit today at 140/70; however, she also had not taken her blood pressure medication.   Miranda Roman returns today and is without complaints. She denies any shortness of breath, chest pain or palpitations .  I had the pleasure of seeing Miranda Roman back in the office today. Recently she was seen by her primary care provider and had complained of some chest pain. This was occurring over a couple of days and noted mostly at rest however sometimes with exertion. She reports not being particularly active. She says it is since resolved and she's not had recurrence. It was not necessarily associated with eating or similar to her reflux symptoms in the past. She did not take medication for it. Her last stress test was in 2010. She does have mild coronary artery disease based on catheterization in 2007.  02/11/2016  Miranda Roman returns today for follow-up. She has no chest pain or worsening shortness of breath. In general she's done fairly well. Blood pressures been well controlled at home and was 131/84 today which she says is "high" for her. She's had no problems with atorvastatin. She takes quinapril/HCTZ. She recently had blood work through her primary care provider in a complete physical and we'll request those numbers. We are monitoring long-term for aortic insufficiency which was seen on her last echo in August 2016.  08/26/2016  Miranda Roman returns today at the request of  Dr. Baird Cancer for reevaluation of chest pain. She has had similar pain like this in the past. She underwent nuclear stress test in 2016 which was low risk and negative for ischemia. Recently some of her symptoms seem to be worse in exaggerated by eating foods. She had been previously told that she had reflux symptoms but was not on any medications. She does have a history of gastric bypass surgery. I wonder if she could have a hiatal hernia that is acting up. She was started on omeprazole last week and notes that she's not had any further symptoms since then.  02/24/2017  Miranda Roman was seen today in follow-up. I last saw her in February where she had some pain in the back of her chest which radiated around to the front. She says that ultimately she lightened up her purse and she noted improvement in her symptoms. It could be that she had thoracic radiculopathy. She just had a repeat echo which shows a stable normal LVEF and mild aortic insufficiency. She denies any recurrent chest pain symptoms.  PMHx:  Past Medical History:  Diagnosis Date  . Anemia   . Diabetes mellitus   . Hyperlipidemia   . Hypertension   . Osteoporosis   . Sickle cell anemia (HCC)     Past Surgical History:  Procedure Laterality Date  . BONE RESECTION  01/2006   wrist  . 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 TUBAL LIGATION  04/28/1977    FAMHx:  Family History  Problem Relation Age of Onset  . Diabetes Mother   . Heart disease Mother   .  Diabetes Father   . Heart disease Father   . Diabetes Sister   . Diabetes Maternal Aunt   . Diabetes Maternal Uncle   . Diabetes Maternal Grandmother   . Diabetes Maternal Grandfather     SOCHx:   reports that she quit smoking about 12 years ago. She has never used smokeless tobacco. She reports that she drinks about 1.2 oz of alcohol per week . She reports that she does not use drugs.  ALLERGIES:  No Known Allergies  ROS: Pertinent items noted in HPI and remainder of  comprehensive ROS otherwise negative.  HOME MEDS: Current Outpatient Prescriptions  Medication Sig Dispense Refill  . aspirin 81 MG tablet Take 81 mg by mouth daily.    [name] atorvastatin (LIPITOR) 40 MG tablet Take 40 mg by mouth daily.    . Biotin (BIOTIN 5000) 5 MG CAPS Take by mouth daily.    . calcitRIOL (ROCALTROL) 0.25 MCG capsule Take 0.25 mcg by mouth as directed.     . cholecalciferol (VITAMIN D) 1000 UNITS tablet Take 1,000 Units by mouth daily.    [name] epoetin alfa (EPOGEN,PROCRIT) 44818 UNIT/ML injection 10,000 Units 3 (three) times a week. Dose unknown    . Glucosamine-Chondroitin (GLUCOSAMINE CHONDR COMPLEX PO) Take 1 tablet by mouth daily.     . insulin detemir (LEVEMIR) 100 UNIT/ML injection Inject 16 Units into the skin at bedtime.     . Probiotic Product (PROBIOTIC ADVANCED PO) Take by mouth.    . quinapril-hydrochlorothiazide (ACCURETIC) 20-12.5 MG per tablet Take 0.5 tablets by mouth daily.      No current facility-administered medications for this visit.     LABS/IMAGING: No results found for this or any previous visit (from the past 48 hour(s)). No results found.  VITALS: BP 118/70   Pulse 75   Ht _0  (1.651 m)   Wt 178 lb (80.7 kg)   BMI 29.62 kg/m   EXAM: General appearance: alert and no distress Neck: no adenopathy, no carotid bruit, no JVD, supple, symmetrical, trachea midline and thyroid not enlarged, symmetric, no tenderness/mass/nodules Lungs: clear to auscultation bilaterally Heart: regular rate and rhythm, S1, S2 normal and diastolic murmur: early diastolic 3/6, blowing at 2nd right intercostal space Abdomen: soft, non-tender; bowel sounds normal; no masses,  no organomegaly Extremities: extremities normal, atraumatic, no cyanosis or edema Pulses: 2+ and symmetric Skin: Skin color, texture, turgor normal. No rashes or lesions Neurologic: Grossly normal  EKG: Normal sinus rhythm with sinus arrhythmia at 75-personally  reviewed  ASSESSMENT: 1. Chest pain at rest- low risk myoview 2016 - may have been a thoracic radiculopathy 2. History of morbid obesity status post bariatric surgery 3. Diabetes, no longer insulin-dependent 4. Hypertension - managed by primary 5. 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  Miranda Roman has had improvement of her chest and back pain which was thought to be related to carrying a heavy purse. She had a repeat echo which shows a stable aortic insufficiency. Blood pressure is well-controlled. Her diabetes and dyslipidemia are managed by primary care provider. We'll plan to see her back in a list as necessary.  Pixie Casino, MD, [facility] Attending Cardiologist [place] [facility]ozo 02/24/2017, 9:47 AM

## 2017-02-24 NOTE — Patient Instructions (Signed)
Your physician wants you to follow-up in: 12 months with Dr. Debara Pickett. You will receive a reminder letter in the mail two months in advance. If you don't receive a letter, please call our office to schedule the follow-up appointment.

## 2017-02-25 DIAGNOSIS — I10 Essential (primary) hypertension: Secondary | ICD-10-CM | POA: Diagnosis not present

## 2017-02-25 DIAGNOSIS — N184 Chronic kidney disease, stage 4 (severe): Secondary | ICD-10-CM | POA: Diagnosis not present

## 2017-02-25 DIAGNOSIS — E119 Type 2 diabetes mellitus without complications: Secondary | ICD-10-CM | POA: Diagnosis not present

## 2017-02-25 DIAGNOSIS — Z794 Long term (current) use of insulin: Secondary | ICD-10-CM | POA: Diagnosis not present

## 2017-02-25 DIAGNOSIS — E1165 Type 2 diabetes mellitus with hyperglycemia: Secondary | ICD-10-CM | POA: Diagnosis not present

## 2017-02-27 ENCOUNTER — Other Ambulatory Visit (HOSPITAL_COMMUNITY): Payer: Self-pay | Admitting: *Deleted

## 2017-03-02 ENCOUNTER — Encounter (HOSPITAL_COMMUNITY)
Admission: RE | Admit: 2017-03-02 | Discharge: 2017-03-02 | Disposition: A | Discharge status: Home / Self Care | Payer: Medicare Other | Source: Ambulatory Visit | Attending: Nephrology | Admitting: Nephrology

## 2017-03-02 DIAGNOSIS — N189 Chronic kidney disease, unspecified: Secondary | ICD-10-CM | POA: Diagnosis not present

## 2017-03-02 DIAGNOSIS — E1122 Type 2 diabetes mellitus with diabetic chronic kidney disease: Secondary | ICD-10-CM

## 2017-03-02 DIAGNOSIS — D631 Anemia in chronic kidney disease: Secondary | ICD-10-CM | POA: Diagnosis not present

## 2017-03-02 LAB — POCT HEMOGLOBIN-HEMACUE: Hemoglobin: 10.8 g/dL — ABNORMAL LOW (ref 12.0–15.0)

## 2017-03-02 MED ORDER — EPOETIN ALFA 40000 UNIT/ML IJ SOLN: 30000.0000 [IU] | INTRAMUSCULAR | Status: DC

## 2017-03-02 MED ORDER — EPOETIN ALFA 10000 UNIT/ML IJ SOLN
INTRAMUSCULAR | Status: AC
  Administered 2017-03-02: 10000 [IU] via SUBCUTANEOUS
  Filled 2017-03-02: qty 1

## 2017-03-02 MED ORDER — SODIUM CHLORIDE 0.9 % IV SOLN
510.0000 mg | Freq: Once | INTRAVENOUS | Status: AC
  Administered 2017-03-02: 510 mg via INTRAVENOUS
  Filled 2017-03-02: qty 17

## 2017-03-02 MED ORDER — EPOETIN ALFA 20000 UNIT/ML IJ SOLN
INTRAMUSCULAR | Status: AC
  Administered 2017-03-02: 20000 [IU] via SUBCUTANEOUS
  Filled 2017-03-02: qty 1

## 2017-03-03 DIAGNOSIS — H00021 Hordeolum internum right upper eyelid: Secondary | ICD-10-CM | POA: Diagnosis not present

## 2017-03-12 DIAGNOSIS — E119 Type 2 diabetes mellitus without complications: Secondary | ICD-10-CM | POA: Diagnosis not present

## 2017-03-12 DIAGNOSIS — Z794 Long term (current) use of insulin: Secondary | ICD-10-CM | POA: Diagnosis not present

## 2017-03-16 ENCOUNTER — Encounter (HOSPITAL_COMMUNITY): Payer: Self-pay

## 2017-03-16 ENCOUNTER — Encounter (HOSPITAL_COMMUNITY): Payer: Medicare Other

## 2017-03-16 ENCOUNTER — Encounter (HOSPITAL_COMMUNITY)
Admission: RE | Admit: 2017-03-16 | Discharge: 2017-03-16 | Disposition: A | Discharge status: Home / Self Care | Payer: Medicare Other | Source: Ambulatory Visit | Attending: Nephrology | Admitting: Nephrology

## 2017-03-16 DIAGNOSIS — N184 Chronic kidney disease, stage 4 (severe): Secondary | ICD-10-CM | POA: Insufficient documentation

## 2017-03-16 DIAGNOSIS — Z5181 Encounter for therapeutic drug level monitoring: Secondary | ICD-10-CM | POA: Insufficient documentation

## 2017-03-16 DIAGNOSIS — Z79899 Other long term (current) drug therapy: Secondary | ICD-10-CM | POA: Insufficient documentation

## 2017-03-16 DIAGNOSIS — I129 Hypertensive chronic kidney disease with stage 1 through stage 4 chronic kidney disease, or unspecified chronic kidney disease: Secondary | ICD-10-CM | POA: Diagnosis not present

## 2017-03-16 DIAGNOSIS — D631 Anemia in chronic kidney disease: Secondary | ICD-10-CM | POA: Insufficient documentation

## 2017-03-16 DIAGNOSIS — Z23 Encounter for immunization: Secondary | ICD-10-CM | POA: Diagnosis not present

## 2017-03-16 DIAGNOSIS — E1122 Type 2 diabetes mellitus with diabetic chronic kidney disease: Secondary | ICD-10-CM

## 2017-03-16 DIAGNOSIS — N08 Glomerular disorders in diseases classified elsewhere: Secondary | ICD-10-CM | POA: Diagnosis not present

## 2017-03-16 DIAGNOSIS — L299 Pruritus, unspecified: Secondary | ICD-10-CM | POA: Diagnosis not present

## 2017-03-16 LAB — POCT HEMOGLOBIN-HEMACUE: Hemoglobin: 10.9 g/dL — ABNORMAL LOW (ref 12.0–15.0)

## 2017-03-16 MED ORDER — EPOETIN ALFA 40000 UNIT/ML IJ SOLN: 30000.0000 [IU] | INTRAMUSCULAR | Status: DC

## 2017-03-16 MED ORDER — EPOETIN ALFA 10000 UNIT/ML IJ SOLN
INTRAMUSCULAR | Status: AC
  Administered 2017-03-16: 10000 [IU] via SUBCUTANEOUS
  Filled 2017-03-16: qty 1

## 2017-03-16 MED ORDER — EPOETIN ALFA 20000 UNIT/ML IJ SOLN
INTRAMUSCULAR | Status: AC
  Administered 2017-03-16: 20000 [IU] via SUBCUTANEOUS
  Filled 2017-03-16: qty 1

## 2017-03-19 ENCOUNTER — Other Ambulatory Visit: Payer: Self-pay | Admitting: Internal Medicine

## 2017-03-30 ENCOUNTER — Encounter (HOSPITAL_COMMUNITY)
Admission: RE | Admit: 2017-03-30 | Discharge: 2017-03-30 | Disposition: A | Discharge status: Home / Self Care | Payer: Medicare Other | Source: Ambulatory Visit | Attending: Nephrology | Admitting: Nephrology

## 2017-03-30 DIAGNOSIS — Z79899 Other long term (current) drug therapy: Secondary | ICD-10-CM | POA: Diagnosis not present

## 2017-03-30 DIAGNOSIS — N184 Chronic kidney disease, stage 4 (severe): Secondary | ICD-10-CM | POA: Diagnosis not present

## 2017-03-30 DIAGNOSIS — D631 Anemia in chronic kidney disease: Secondary | ICD-10-CM | POA: Diagnosis not present

## 2017-03-30 DIAGNOSIS — M8589 Other specified disorders of bone density and structure, multiple sites: Secondary | ICD-10-CM | POA: Diagnosis not present

## 2017-03-30 DIAGNOSIS — Z5181 Encounter for therapeutic drug level monitoring: Secondary | ICD-10-CM | POA: Diagnosis not present

## 2017-03-30 DIAGNOSIS — E1122 Type 2 diabetes mellitus with diabetic chronic kidney disease: Secondary | ICD-10-CM

## 2017-03-30 LAB — POCT HEMOGLOBIN-HEMACUE: Hemoglobin: 11 g/dL — ABNORMAL LOW (ref 12.0–15.0)

## 2017-03-30 LAB — IRON AND TIBC
Iron: 85 ug/dL (ref 28–170)
Saturation Ratios: 36 % — ABNORMAL HIGH (ref 10.4–31.8)
TIBC: 239 ug/dL — ABNORMAL LOW (ref 250–450)
UIBC: 154 ug/dL

## 2017-03-30 LAB — FERRITIN: Ferritin: 274 ng/mL (ref 11–307)

## 2017-03-30 MED ORDER — EPOETIN ALFA 10000 UNIT/ML IJ SOLN
INTRAMUSCULAR | Status: AC
  Administered 2017-03-30: 10000 [IU] via SUBCUTANEOUS
  Filled 2017-03-30: qty 1

## 2017-03-30 MED ORDER — EPOETIN ALFA 40000 UNIT/ML IJ SOLN: 30000.0000 [IU] | INTRAMUSCULAR | Status: DC

## 2017-03-30 MED ORDER — EPOETIN ALFA 20000 UNIT/ML IJ SOLN
INTRAMUSCULAR | Status: AC
  Administered 2017-03-30: 20000 [IU] via SUBCUTANEOUS
  Filled 2017-03-30: qty 1

## 2017-04-20 ENCOUNTER — Encounter (HOSPITAL_COMMUNITY)
Admission: RE | Admit: 2017-04-20 | Discharge: 2017-04-20 | Disposition: A | Discharge status: Home / Self Care | Payer: Medicare Other | Source: Ambulatory Visit | Attending: Nephrology | Admitting: Nephrology

## 2017-04-20 DIAGNOSIS — Z5181 Encounter for therapeutic drug level monitoring: Secondary | ICD-10-CM | POA: Insufficient documentation

## 2017-04-20 DIAGNOSIS — D631 Anemia in chronic kidney disease: Secondary | ICD-10-CM | POA: Diagnosis not present

## 2017-04-20 DIAGNOSIS — Z79899 Other long term (current) drug therapy: Secondary | ICD-10-CM | POA: Diagnosis not present

## 2017-04-20 DIAGNOSIS — N184 Chronic kidney disease, stage 4 (severe): Secondary | ICD-10-CM | POA: Diagnosis not present

## 2017-04-20 DIAGNOSIS — E1122 Type 2 diabetes mellitus with diabetic chronic kidney disease: Secondary | ICD-10-CM

## 2017-04-20 LAB — POCT HEMOGLOBIN-HEMACUE: Hemoglobin: 11.2 g/dL — ABNORMAL LOW (ref 12.0–15.0)

## 2017-04-20 MED ORDER — EPOETIN ALFA 10000 UNIT/ML IJ SOLN
INTRAMUSCULAR | Status: AC
  Administered 2017-04-20: 10000 [IU] via SUBCUTANEOUS
  Filled 2017-04-20: qty 1

## 2017-04-20 MED ORDER — EPOETIN ALFA 40000 UNIT/ML IJ SOLN: 30000.0000 [IU] | INTRAMUSCULAR | Status: DC

## 2017-04-20 MED ORDER — EPOETIN ALFA 20000 UNIT/ML IJ SOLN
INTRAMUSCULAR | Status: AC
  Administered 2017-04-20: 20000 [IU] via SUBCUTANEOUS
  Filled 2017-04-20: qty 1

## 2017-05-04 ENCOUNTER — Encounter (HOSPITAL_COMMUNITY)
Admission: RE | Admit: 2017-05-04 | Discharge: 2017-05-04 | Disposition: A | Discharge status: Home / Self Care | Payer: Medicare Other | Source: Ambulatory Visit | Attending: Nephrology | Admitting: Nephrology

## 2017-05-04 ENCOUNTER — Encounter (HOSPITAL_COMMUNITY): Payer: Medicare Other

## 2017-05-04 DIAGNOSIS — Z79899 Other long term (current) drug therapy: Secondary | ICD-10-CM | POA: Diagnosis not present

## 2017-05-04 DIAGNOSIS — Z5181 Encounter for therapeutic drug level monitoring: Secondary | ICD-10-CM | POA: Diagnosis not present

## 2017-05-04 DIAGNOSIS — N184 Chronic kidney disease, stage 4 (severe): Secondary | ICD-10-CM | POA: Diagnosis not present

## 2017-05-04 DIAGNOSIS — D631 Anemia in chronic kidney disease: Secondary | ICD-10-CM | POA: Diagnosis not present

## 2017-05-04 DIAGNOSIS — E1122 Type 2 diabetes mellitus with diabetic chronic kidney disease: Secondary | ICD-10-CM

## 2017-05-04 LAB — POCT HEMOGLOBIN-HEMACUE: Hemoglobin: 11.1 g/dL — ABNORMAL LOW (ref 12.0–15.0)

## 2017-05-04 MED ORDER — EPOETIN ALFA 40000 UNIT/ML IJ SOLN: 30000.0000 [IU] | INTRAMUSCULAR | Status: DC

## 2017-05-04 MED ORDER — EPOETIN ALFA 20000 UNIT/ML IJ SOLN
INTRAMUSCULAR | Status: AC
  Administered 2017-05-04: 20000 [IU] via SUBCUTANEOUS
  Filled 2017-05-04: qty 1

## 2017-05-04 MED ORDER — EPOETIN ALFA 10000 UNIT/ML IJ SOLN
INTRAMUSCULAR | Status: AC
  Administered 2017-05-04: 10000 [IU]
  Filled 2017-05-04: qty 1

## 2017-05-18 ENCOUNTER — Encounter (HOSPITAL_COMMUNITY)
Admission: RE | Admit: 2017-05-18 | Discharge: 2017-05-18 | Disposition: A | Discharge status: Home / Self Care | Payer: Medicare Other | Source: Ambulatory Visit | Attending: Nephrology | Admitting: Nephrology

## 2017-05-18 VITALS — BP 143/68 | HR 70 | Temp 98.5°F | Resp 18

## 2017-05-18 DIAGNOSIS — Z5181 Encounter for therapeutic drug level monitoring: Secondary | ICD-10-CM | POA: Diagnosis not present

## 2017-05-18 DIAGNOSIS — D631 Anemia in chronic kidney disease: Secondary | ICD-10-CM | POA: Diagnosis not present

## 2017-05-18 DIAGNOSIS — N184 Chronic kidney disease, stage 4 (severe): Secondary | ICD-10-CM | POA: Insufficient documentation

## 2017-05-18 DIAGNOSIS — Z79899 Other long term (current) drug therapy: Secondary | ICD-10-CM | POA: Insufficient documentation

## 2017-05-18 DIAGNOSIS — E1122 Type 2 diabetes mellitus with diabetic chronic kidney disease: Secondary | ICD-10-CM

## 2017-05-18 LAB — IRON AND TIBC
Iron: 91 ug/dL (ref 28–170)
Saturation Ratios: 37 % — ABNORMAL HIGH (ref 10.4–31.8)
TIBC: 244 ug/dL — ABNORMAL LOW (ref 250–450)
UIBC: 153 ug/dL

## 2017-05-18 LAB — POCT HEMOGLOBIN-HEMACUE: Hemoglobin: 10.9 g/dL — ABNORMAL LOW (ref 12.0–15.0)

## 2017-05-18 LAB — FERRITIN: Ferritin: 315 ng/mL — ABNORMAL HIGH (ref 11–307)

## 2017-05-18 MED ORDER — EPOETIN ALFA 40000 UNIT/ML IJ SOLN: 30000.0000 [IU] | INTRAMUSCULAR | Status: DC

## 2017-05-18 MED ORDER — EPOETIN ALFA 10000 UNIT/ML IJ SOLN
INTRAMUSCULAR | Status: AC
  Administered 2017-05-18: 10000 [IU] via SUBCUTANEOUS
  Filled 2017-05-18: qty 1

## 2017-05-18 MED ORDER — EPOETIN ALFA 20000 UNIT/ML IJ SOLN
INTRAMUSCULAR | Status: AC
  Administered 2017-05-18: 20000 [IU] via SUBCUTANEOUS
  Filled 2017-05-18: qty 1

## 2017-05-19 DIAGNOSIS — N2581 Secondary hyperparathyroidism of renal origin: Secondary | ICD-10-CM | POA: Diagnosis not present

## 2017-05-19 DIAGNOSIS — D631 Anemia in chronic kidney disease: Secondary | ICD-10-CM | POA: Diagnosis not present

## 2017-05-19 DIAGNOSIS — N184 Chronic kidney disease, stage 4 (severe): Secondary | ICD-10-CM | POA: Diagnosis not present

## 2017-05-19 DIAGNOSIS — I129 Hypertensive chronic kidney disease with stage 1 through stage 4 chronic kidney disease, or unspecified chronic kidney disease: Secondary | ICD-10-CM | POA: Diagnosis not present

## 2017-05-19 DIAGNOSIS — E1122 Type 2 diabetes mellitus with diabetic chronic kidney disease: Secondary | ICD-10-CM | POA: Diagnosis not present

## 2017-06-02 ENCOUNTER — Other Ambulatory Visit (HOSPITAL_COMMUNITY): Payer: Self-pay | Admitting: *Deleted

## 2017-06-02 ENCOUNTER — Encounter (HOSPITAL_COMMUNITY): Payer: Medicare Other

## 2017-06-03 ENCOUNTER — Encounter (HOSPITAL_COMMUNITY)
Admission: RE | Admit: 2017-06-03 | Discharge: 2017-06-03 | Disposition: A | Discharge status: Home / Self Care | Payer: Medicare Other | Source: Ambulatory Visit | Attending: Nephrology | Admitting: Nephrology

## 2017-06-03 VITALS — BP 133/68 | HR 79 | Temp 97.9°F | Resp 20

## 2017-06-03 DIAGNOSIS — E1122 Type 2 diabetes mellitus with diabetic chronic kidney disease: Secondary | ICD-10-CM

## 2017-06-03 DIAGNOSIS — Z79899 Other long term (current) drug therapy: Secondary | ICD-10-CM | POA: Diagnosis not present

## 2017-06-03 DIAGNOSIS — D631 Anemia in chronic kidney disease: Secondary | ICD-10-CM | POA: Diagnosis not present

## 2017-06-03 DIAGNOSIS — N184 Chronic kidney disease, stage 4 (severe): Secondary | ICD-10-CM | POA: Diagnosis not present

## 2017-06-03 DIAGNOSIS — Z5181 Encounter for therapeutic drug level monitoring: Secondary | ICD-10-CM | POA: Diagnosis not present

## 2017-06-03 LAB — POCT HEMOGLOBIN-HEMACUE: Hemoglobin: 10.7 g/dL — ABNORMAL LOW (ref 12.0–15.0)

## 2017-06-03 MED ORDER — EPOETIN ALFA 20000 UNIT/ML IJ SOLN
INTRAMUSCULAR | Status: AC
  Administered 2017-06-03: 20000 [IU]
  Filled 2017-06-03: qty 1

## 2017-06-03 MED ORDER — EPOETIN ALFA 40000 UNIT/ML IJ SOLN: 30000.0000 [IU] | INTRAMUSCULAR | Status: DC

## 2017-06-03 MED ORDER — EPOETIN ALFA 10000 UNIT/ML IJ SOLN
INTRAMUSCULAR | Status: AC
  Administered 2017-06-03: 10000 [IU]
  Filled 2017-06-03: qty 1

## 2017-06-05 DIAGNOSIS — N184 Chronic kidney disease, stage 4 (severe): Secondary | ICD-10-CM | POA: Diagnosis not present

## 2017-06-05 DIAGNOSIS — E1165 Type 2 diabetes mellitus with hyperglycemia: Secondary | ICD-10-CM | POA: Diagnosis not present

## 2017-06-05 DIAGNOSIS — Z794 Long term (current) use of insulin: Secondary | ICD-10-CM | POA: Diagnosis not present

## 2017-06-05 DIAGNOSIS — I10 Essential (primary) hypertension: Secondary | ICD-10-CM | POA: Diagnosis not present

## 2017-06-16 ENCOUNTER — Encounter (HOSPITAL_COMMUNITY)
Admission: RE | Admit: 2017-06-16 | Discharge: 2017-06-16 | Disposition: A | Discharge status: Home / Self Care | Payer: Medicare Other | Source: Ambulatory Visit | Attending: Nephrology | Admitting: Nephrology

## 2017-06-16 VITALS — BP 124/77 | HR 89 | Temp 97.8°F | Resp 20

## 2017-06-16 DIAGNOSIS — Z79899 Other long term (current) drug therapy: Secondary | ICD-10-CM | POA: Diagnosis not present

## 2017-06-16 DIAGNOSIS — N184 Chronic kidney disease, stage 4 (severe): Secondary | ICD-10-CM | POA: Insufficient documentation

## 2017-06-16 DIAGNOSIS — Z5181 Encounter for therapeutic drug level monitoring: Secondary | ICD-10-CM | POA: Insufficient documentation

## 2017-06-16 DIAGNOSIS — D631 Anemia in chronic kidney disease: Secondary | ICD-10-CM | POA: Diagnosis not present

## 2017-06-16 DIAGNOSIS — E1122 Type 2 diabetes mellitus with diabetic chronic kidney disease: Secondary | ICD-10-CM

## 2017-06-16 LAB — POCT HEMOGLOBIN-HEMACUE: Hemoglobin: 11.5 g/dL — ABNORMAL LOW (ref 12.0–15.0)

## 2017-06-16 MED ORDER — EPOETIN ALFA 10000 UNIT/ML IJ SOLN
INTRAMUSCULAR | Status: AC
  Administered 2017-06-16: 10000 [IU]
  Filled 2017-06-16: qty 1

## 2017-06-16 MED ORDER — EPOETIN ALFA 40000 UNIT/ML IJ SOLN: 30000.0000 [IU] | INTRAMUSCULAR | Status: DC

## 2017-06-16 MED ORDER — EPOETIN ALFA 20000 UNIT/ML IJ SOLN
INTRAMUSCULAR | Status: AC
  Administered 2017-06-16: 20000 [IU]
  Filled 2017-06-16: qty 1

## 2017-06-29 ENCOUNTER — Encounter (HOSPITAL_COMMUNITY)
Admission: RE | Admit: 2017-06-29 | Discharge: 2017-06-29 | Disposition: A | Discharge status: Home / Self Care | Payer: Medicare Other | Source: Ambulatory Visit | Attending: Nephrology | Admitting: Nephrology

## 2017-06-29 VITALS — BP 130/72 | HR 75 | Resp 20

## 2017-06-29 DIAGNOSIS — Z5181 Encounter for therapeutic drug level monitoring: Secondary | ICD-10-CM | POA: Diagnosis not present

## 2017-06-29 DIAGNOSIS — E1122 Type 2 diabetes mellitus with diabetic chronic kidney disease: Secondary | ICD-10-CM

## 2017-06-29 DIAGNOSIS — N184 Chronic kidney disease, stage 4 (severe): Secondary | ICD-10-CM | POA: Diagnosis not present

## 2017-06-29 DIAGNOSIS — D631 Anemia in chronic kidney disease: Secondary | ICD-10-CM | POA: Diagnosis not present

## 2017-06-29 DIAGNOSIS — Z79899 Other long term (current) drug therapy: Secondary | ICD-10-CM | POA: Diagnosis not present

## 2017-06-29 LAB — IRON AND TIBC
Iron: 74 ug/dL (ref 28–170)
Saturation Ratios: 31 % (ref 10.4–31.8)
TIBC: 242 ug/dL — ABNORMAL LOW (ref 250–450)
UIBC: 168 ug/dL

## 2017-06-29 LAB — POCT HEMOGLOBIN-HEMACUE: Hemoglobin: 11.3 g/dL — ABNORMAL LOW (ref 12.0–15.0)

## 2017-06-29 LAB — FERRITIN: Ferritin: 280 ng/mL (ref 11–307)

## 2017-06-29 MED ORDER — EPOETIN ALFA 20000 UNIT/ML IJ SOLN
INTRAMUSCULAR | Status: AC
  Administered 2017-06-29: 20000 [IU] via SUBCUTANEOUS
  Filled 2017-06-29: qty 1

## 2017-06-29 MED ORDER — EPOETIN ALFA 10000 UNIT/ML IJ SOLN
INTRAMUSCULAR | Status: AC
  Administered 2017-06-29: 10000 [IU] via SUBCUTANEOUS
  Filled 2017-06-29: qty 1

## 2017-06-29 MED ORDER — EPOETIN ALFA 40000 UNIT/ML IJ SOLN: 30000.0000 [IU] | INTRAMUSCULAR | Status: DC

## 2017-07-13 ENCOUNTER — Encounter (HOSPITAL_COMMUNITY)
Admission: RE | Admit: 2017-07-13 | Discharge: 2017-07-13 | Disposition: A | Discharge status: Home / Self Care | Payer: Medicare Other | Source: Ambulatory Visit | Attending: Nephrology | Admitting: Nephrology

## 2017-07-13 VITALS — BP 128/69 | HR 76 | Temp 98.0°F | Resp 20

## 2017-07-13 DIAGNOSIS — Z79899 Other long term (current) drug therapy: Secondary | ICD-10-CM | POA: Diagnosis not present

## 2017-07-13 DIAGNOSIS — N184 Chronic kidney disease, stage 4 (severe): Secondary | ICD-10-CM | POA: Insufficient documentation

## 2017-07-13 DIAGNOSIS — Z5181 Encounter for therapeutic drug level monitoring: Secondary | ICD-10-CM | POA: Insufficient documentation

## 2017-07-13 DIAGNOSIS — D631 Anemia in chronic kidney disease: Secondary | ICD-10-CM | POA: Insufficient documentation

## 2017-07-13 DIAGNOSIS — E1122 Type 2 diabetes mellitus with diabetic chronic kidney disease: Secondary | ICD-10-CM

## 2017-07-13 LAB — POCT HEMOGLOBIN-HEMACUE: Hemoglobin: 11.7 g/dL — ABNORMAL LOW (ref 12.0–15.0)

## 2017-07-13 MED ORDER — EPOETIN ALFA 20000 UNIT/ML IJ SOLN
INTRAMUSCULAR | Status: AC
  Administered 2017-07-13: 20000 [IU]
  Filled 2017-07-13: qty 1

## 2017-07-13 MED ORDER — EPOETIN ALFA 40000 UNIT/ML IJ SOLN: 30000.0000 [IU] | INTRAMUSCULAR | Status: DC

## 2017-07-13 MED ORDER — EPOETIN ALFA 10000 UNIT/ML IJ SOLN
INTRAMUSCULAR | Status: AC
  Administered 2017-07-13: 10000 [IU]
  Filled 2017-07-13: qty 1

## 2017-07-27 ENCOUNTER — Encounter (HOSPITAL_COMMUNITY): Payer: Medicare Other

## 2017-08-05 ENCOUNTER — Encounter (HOSPITAL_COMMUNITY)
Admission: RE | Admit: 2017-08-05 | Discharge: 2017-08-05 | Disposition: A | Discharge status: Home / Self Care | Payer: Medicare Other | Source: Ambulatory Visit | Attending: Nephrology | Admitting: Nephrology

## 2017-08-05 VITALS — BP 130/66 | HR 72 | Temp 97.6°F | Resp 20

## 2017-08-05 DIAGNOSIS — Z79899 Other long term (current) drug therapy: Secondary | ICD-10-CM | POA: Diagnosis not present

## 2017-08-05 DIAGNOSIS — D631 Anemia in chronic kidney disease: Secondary | ICD-10-CM | POA: Diagnosis not present

## 2017-08-05 DIAGNOSIS — Z5181 Encounter for therapeutic drug level monitoring: Secondary | ICD-10-CM | POA: Diagnosis not present

## 2017-08-05 DIAGNOSIS — N184 Chronic kidney disease, stage 4 (severe): Secondary | ICD-10-CM | POA: Diagnosis not present

## 2017-08-05 DIAGNOSIS — E1122 Type 2 diabetes mellitus with diabetic chronic kidney disease: Secondary | ICD-10-CM

## 2017-08-05 MED ORDER — EPOETIN ALFA 20000 UNIT/ML IJ SOLN
INTRAMUSCULAR | Status: AC
  Administered 2017-08-05: 20000 [IU] via SUBCUTANEOUS
  Filled 2017-08-05: qty 1

## 2017-08-05 MED ORDER — EPOETIN ALFA 40000 UNIT/ML IJ SOLN: 30000.0000 [IU] | INTRAMUSCULAR | Status: DC

## 2017-08-05 MED ORDER — EPOETIN ALFA 10000 UNIT/ML IJ SOLN
INTRAMUSCULAR | Status: AC
  Administered 2017-08-05: 10000 [IU] via SUBCUTANEOUS
  Filled 2017-08-05: qty 1

## 2017-08-06 LAB — POCT HEMOGLOBIN-HEMACUE: Hemoglobin: 10.7 g/dL — ABNORMAL LOW (ref 12.0–15.0)

## 2017-08-17 ENCOUNTER — Encounter (HOSPITAL_COMMUNITY)
Admission: RE | Admit: 2017-08-17 | Discharge: 2017-08-17 | Disposition: A | Discharge status: Home / Self Care | Payer: Medicare Other | Source: Ambulatory Visit | Attending: Nephrology | Admitting: Nephrology

## 2017-08-17 VITALS — BP 131/69 | HR 77 | Temp 98.6°F | Resp 20

## 2017-08-17 DIAGNOSIS — D631 Anemia in chronic kidney disease: Secondary | ICD-10-CM | POA: Diagnosis not present

## 2017-08-17 DIAGNOSIS — E1122 Type 2 diabetes mellitus with diabetic chronic kidney disease: Secondary | ICD-10-CM

## 2017-08-17 DIAGNOSIS — N184 Chronic kidney disease, stage 4 (severe): Secondary | ICD-10-CM | POA: Diagnosis not present

## 2017-08-17 DIAGNOSIS — Z79899 Other long term (current) drug therapy: Secondary | ICD-10-CM | POA: Insufficient documentation

## 2017-08-17 DIAGNOSIS — Z5181 Encounter for therapeutic drug level monitoring: Secondary | ICD-10-CM | POA: Diagnosis not present

## 2017-08-17 LAB — IRON AND TIBC
Iron: 82 ug/dL (ref 28–170)
Saturation Ratios: 35 % — ABNORMAL HIGH (ref 10.4–31.8)
TIBC: 234 ug/dL — ABNORMAL LOW (ref 250–450)
UIBC: 152 ug/dL

## 2017-08-17 LAB — FERRITIN: Ferritin: 242 ng/mL (ref 11–307)

## 2017-08-17 LAB — POCT HEMOGLOBIN-HEMACUE: Hemoglobin: 10.7 g/dL — ABNORMAL LOW (ref 12.0–15.0)

## 2017-08-17 MED ORDER — EPOETIN ALFA 40000 UNIT/ML IJ SOLN: 30000.0000 [IU] | INTRAMUSCULAR | Status: DC

## 2017-08-17 MED ORDER — EPOETIN ALFA 20000 UNIT/ML IJ SOLN
INTRAMUSCULAR | Status: AC
  Administered 2017-08-17: 20000 [IU]
  Filled 2017-08-17: qty 1

## 2017-08-17 MED ORDER — EPOETIN ALFA 10000 UNIT/ML IJ SOLN
INTRAMUSCULAR | Status: AC
  Administered 2017-08-17: 10000 [IU]
  Filled 2017-08-17: qty 1

## 2017-08-31 ENCOUNTER — Encounter (HOSPITAL_COMMUNITY)
Admission: RE | Admit: 2017-08-31 | Discharge: 2017-08-31 | Disposition: A | Discharge status: Home / Self Care | Payer: Medicare Other | Source: Ambulatory Visit | Attending: Nephrology | Admitting: Nephrology

## 2017-08-31 VITALS — BP 117/63 | HR 89 | Temp 98.6°F | Resp 20

## 2017-08-31 DIAGNOSIS — E1122 Type 2 diabetes mellitus with diabetic chronic kidney disease: Secondary | ICD-10-CM

## 2017-08-31 DIAGNOSIS — Z5181 Encounter for therapeutic drug level monitoring: Secondary | ICD-10-CM | POA: Diagnosis not present

## 2017-08-31 DIAGNOSIS — N184 Chronic kidney disease, stage 4 (severe): Secondary | ICD-10-CM | POA: Diagnosis not present

## 2017-08-31 DIAGNOSIS — D631 Anemia in chronic kidney disease: Secondary | ICD-10-CM | POA: Diagnosis not present

## 2017-08-31 DIAGNOSIS — Z79899 Other long term (current) drug therapy: Secondary | ICD-10-CM | POA: Diagnosis not present

## 2017-08-31 LAB — POCT HEMOGLOBIN-HEMACUE: Hemoglobin: 11 g/dL — ABNORMAL LOW (ref 12.0–15.0)

## 2017-08-31 MED ORDER — EPOETIN ALFA 10000 UNIT/ML IJ SOLN
INTRAMUSCULAR | Status: AC
  Administered 2017-08-31: 10000 [IU] via SUBCUTANEOUS
  Filled 2017-08-31: qty 1

## 2017-08-31 MED ORDER — EPOETIN ALFA 20000 UNIT/ML IJ SOLN
INTRAMUSCULAR | Status: AC
  Administered 2017-08-31: 20000 [IU] via SUBCUTANEOUS
  Filled 2017-08-31: qty 1

## 2017-08-31 MED ORDER — EPOETIN ALFA 40000 UNIT/ML IJ SOLN: 30000.0000 [IU] | INTRAMUSCULAR | Status: DC

## 2017-09-04 DIAGNOSIS — Z1211 Encounter for screening for malignant neoplasm of colon: Secondary | ICD-10-CM | POA: Diagnosis not present

## 2017-09-07 DIAGNOSIS — E118 Type 2 diabetes mellitus with unspecified complications: Secondary | ICD-10-CM | POA: Diagnosis not present

## 2017-09-07 DIAGNOSIS — N2581 Secondary hyperparathyroidism of renal origin: Secondary | ICD-10-CM | POA: Diagnosis not present

## 2017-09-07 DIAGNOSIS — N184 Chronic kidney disease, stage 4 (severe): Secondary | ICD-10-CM | POA: Diagnosis not present

## 2017-09-07 DIAGNOSIS — D631 Anemia in chronic kidney disease: Secondary | ICD-10-CM | POA: Diagnosis not present

## 2017-09-07 DIAGNOSIS — I129 Hypertensive chronic kidney disease with stage 1 through stage 4 chronic kidney disease, or unspecified chronic kidney disease: Secondary | ICD-10-CM | POA: Diagnosis not present

## 2017-09-14 ENCOUNTER — Encounter (HOSPITAL_COMMUNITY)
Admission: RE | Admit: 2017-09-14 | Discharge: 2017-09-14 | Disposition: A | Discharge status: Home / Self Care | Payer: Medicare Other | Source: Ambulatory Visit | Attending: Nephrology | Admitting: Nephrology

## 2017-09-14 VITALS — BP 107/75 | HR 89 | Temp 98.3°F | Resp 20

## 2017-09-14 DIAGNOSIS — D631 Anemia in chronic kidney disease: Secondary | ICD-10-CM | POA: Diagnosis not present

## 2017-09-14 DIAGNOSIS — E1122 Type 2 diabetes mellitus with diabetic chronic kidney disease: Secondary | ICD-10-CM

## 2017-09-14 DIAGNOSIS — N184 Chronic kidney disease, stage 4 (severe): Secondary | ICD-10-CM | POA: Insufficient documentation

## 2017-09-14 DIAGNOSIS — Z5181 Encounter for therapeutic drug level monitoring: Secondary | ICD-10-CM | POA: Diagnosis not present

## 2017-09-14 DIAGNOSIS — Z79899 Other long term (current) drug therapy: Secondary | ICD-10-CM | POA: Diagnosis not present

## 2017-09-14 LAB — POCT HEMOGLOBIN-HEMACUE: Hemoglobin: 11.1 g/dL — ABNORMAL LOW (ref 12.0–15.0)

## 2017-09-14 MED ORDER — EPOETIN ALFA 20000 UNIT/ML IJ SOLN
INTRAMUSCULAR | Status: AC
  Administered 2017-09-14: 20000 [IU] via SUBCUTANEOUS
  Filled 2017-09-14: qty 1

## 2017-09-14 MED ORDER — EPOETIN ALFA 10000 UNIT/ML IJ SOLN
INTRAMUSCULAR | Status: AC
  Administered 2017-09-14: 10000 [IU] via SUBCUTANEOUS
  Filled 2017-09-14: qty 1

## 2017-09-14 MED ORDER — EPOETIN ALFA 40000 UNIT/ML IJ SOLN: 30000.0000 [IU] | INTRAMUSCULAR | Status: DC

## 2017-09-17 DIAGNOSIS — I10 Essential (primary) hypertension: Secondary | ICD-10-CM | POA: Diagnosis not present

## 2017-09-17 DIAGNOSIS — N184 Chronic kidney disease, stage 4 (severe): Secondary | ICD-10-CM | POA: Diagnosis not present

## 2017-09-17 DIAGNOSIS — E1165 Type 2 diabetes mellitus with hyperglycemia: Secondary | ICD-10-CM | POA: Diagnosis not present

## 2017-09-17 DIAGNOSIS — Z794 Long term (current) use of insulin: Secondary | ICD-10-CM | POA: Diagnosis not present

## 2017-09-28 ENCOUNTER — Ambulatory Visit (HOSPITAL_COMMUNITY)
Admission: RE | Admit: 2017-09-28 | Discharge: 2017-09-28 | Disposition: A | Discharge status: Home / Self Care | Payer: Medicare Other | Source: Ambulatory Visit | Attending: Nephrology | Admitting: Nephrology

## 2017-09-28 VITALS — BP 132/61 | HR 93 | Temp 97.8°F | Resp 20

## 2017-09-28 DIAGNOSIS — E1122 Type 2 diabetes mellitus with diabetic chronic kidney disease: Secondary | ICD-10-CM

## 2017-09-28 DIAGNOSIS — N184 Chronic kidney disease, stage 4 (severe): Secondary | ICD-10-CM | POA: Insufficient documentation

## 2017-09-28 DIAGNOSIS — D631 Anemia in chronic kidney disease: Secondary | ICD-10-CM | POA: Diagnosis not present

## 2017-09-28 LAB — FERRITIN: Ferritin: 207 ng/mL (ref 11–307)

## 2017-09-28 LAB — IRON AND TIBC
Iron: 93 ug/dL (ref 28–170)
Saturation Ratios: 39 % — ABNORMAL HIGH (ref 10.4–31.8)
TIBC: 237 ug/dL — ABNORMAL LOW (ref 250–450)
UIBC: 144 ug/dL

## 2017-09-28 LAB — POCT HEMOGLOBIN-HEMACUE: Hemoglobin: 11.1 g/dL — ABNORMAL LOW (ref 12.0–15.0)

## 2017-09-28 MED ORDER — EPOETIN ALFA 20000 UNIT/ML IJ SOLN
INTRAMUSCULAR | Status: AC
  Administered 2017-09-28: 20000 [IU] via SUBCUTANEOUS
  Filled 2017-09-28: qty 1

## 2017-09-28 MED ORDER — EPOETIN ALFA 10000 UNIT/ML IJ SOLN
INTRAMUSCULAR | Status: AC
  Administered 2017-09-28: 10000 [IU] via SUBCUTANEOUS
  Filled 2017-09-28: qty 1

## 2017-09-28 MED ORDER — EPOETIN ALFA 40000 UNIT/ML IJ SOLN: 30000.0000 [IU] | INTRAMUSCULAR | Status: DC

## 2017-10-09 ENCOUNTER — Other Ambulatory Visit (HOSPITAL_COMMUNITY): Payer: Self-pay | Admitting: *Deleted

## 2017-10-12 ENCOUNTER — Ambulatory Visit (HOSPITAL_COMMUNITY)
Admission: RE | Admit: 2017-10-12 | Discharge: 2017-10-12 | Disposition: A | Discharge status: Home / Self Care | Payer: Medicare Other | Source: Ambulatory Visit | Attending: Nephrology | Admitting: Nephrology

## 2017-10-12 VITALS — BP 127/68 | HR 75 | Temp 97.8°F | Resp 16 | Ht 64.75 in | Wt 178.0 lb

## 2017-10-12 DIAGNOSIS — D631 Anemia in chronic kidney disease: Secondary | ICD-10-CM | POA: Insufficient documentation

## 2017-10-12 DIAGNOSIS — E1122 Type 2 diabetes mellitus with diabetic chronic kidney disease: Secondary | ICD-10-CM

## 2017-10-12 DIAGNOSIS — N184 Chronic kidney disease, stage 4 (severe): Secondary | ICD-10-CM | POA: Diagnosis not present

## 2017-10-12 LAB — POCT HEMOGLOBIN-HEMACUE: Hemoglobin: 11.2 g/dL — ABNORMAL LOW (ref 12.0–15.0)

## 2017-10-12 MED ORDER — EPOETIN ALFA 40000 UNIT/ML IJ SOLN: 30000.0000 [IU] | INTRAMUSCULAR | Status: DC

## 2017-10-12 MED ORDER — EPOETIN ALFA 10000 UNIT/ML IJ SOLN
INTRAMUSCULAR | Status: AC
  Administered 2017-10-12: 10000 [IU] via SUBCUTANEOUS
  Filled 2017-10-12: qty 1

## 2017-10-12 MED ORDER — EPOETIN ALFA 20000 UNIT/ML IJ SOLN
INTRAMUSCULAR | Status: AC
  Administered 2017-10-12: 20000 [IU] via SUBCUTANEOUS
  Filled 2017-10-12: qty 1

## 2017-10-15 DIAGNOSIS — Z1389 Encounter for screening for other disorder: Secondary | ICD-10-CM | POA: Diagnosis not present

## 2017-10-15 DIAGNOSIS — N184 Chronic kidney disease, stage 4 (severe): Secondary | ICD-10-CM | POA: Diagnosis not present

## 2017-10-15 DIAGNOSIS — N08 Glomerular disorders in diseases classified elsewhere: Secondary | ICD-10-CM | POA: Diagnosis not present

## 2017-10-15 DIAGNOSIS — I129 Hypertensive chronic kidney disease with stage 1 through stage 4 chronic kidney disease, or unspecified chronic kidney disease: Secondary | ICD-10-CM | POA: Insufficient documentation

## 2017-10-15 DIAGNOSIS — E1122 Type 2 diabetes mellitus with diabetic chronic kidney disease: Secondary | ICD-10-CM | POA: Diagnosis not present

## 2017-10-15 HISTORY — DX: Hypertensive chronic kidney disease with stage 1 through stage 4 chronic kidney disease, or unspecified chronic kidney disease: I12.9

## 2017-10-15 LAB — HEPATIC FUNCTION PANEL
ALT: 18 (ref 7–35)
AST: 15 (ref 13–35)
Alkaline Phosphatase: 57 (ref 25–125)
Bilirubin, Total: 0.4

## 2017-10-15 LAB — LIPID PANEL
Cholesterol: 131 (ref 0–200)
HDL: 42 (ref 35–70)
LDL Cholesterol: 74
LDl/HDL Ratio: 1.8
Triglycerides: 77 (ref 40–160)

## 2017-10-15 LAB — BASIC METABOLIC PANEL
BUN: 49 — AB (ref 4–21)
Creatinine: 2.4 — AB (ref 0.5–1.1)
Glucose: 225
Potassium: 4.4 (ref 3.4–5.3)
Sodium: 142 (ref 137–147)

## 2017-11-02 ENCOUNTER — Ambulatory Visit (HOSPITAL_COMMUNITY)
Admission: RE | Admit: 2017-11-02 | Discharge: 2017-11-02 | Disposition: A | Discharge status: Home / Self Care | Payer: Medicare Other | Source: Ambulatory Visit | Attending: Nephrology | Admitting: Nephrology

## 2017-11-02 ENCOUNTER — Other Ambulatory Visit: Payer: Self-pay

## 2017-11-02 ENCOUNTER — Encounter (HOSPITAL_COMMUNITY): Payer: Self-pay

## 2017-11-02 ENCOUNTER — Emergency Department (HOSPITAL_COMMUNITY): Payer: Medicare Other

## 2017-11-02 VITALS — BP 130/72 | HR 81 | Temp 98.5°F | Resp 20

## 2017-11-02 DIAGNOSIS — Z5321 Procedure and treatment not carried out due to patient leaving prior to being seen by health care provider: Secondary | ICD-10-CM | POA: Diagnosis not present

## 2017-11-02 DIAGNOSIS — D631 Anemia in chronic kidney disease: Secondary | ICD-10-CM

## 2017-11-02 DIAGNOSIS — R079 Chest pain, unspecified: Secondary | ICD-10-CM | POA: Diagnosis not present

## 2017-11-02 DIAGNOSIS — N184 Chronic kidney disease, stage 4 (severe): Secondary | ICD-10-CM

## 2017-11-02 DIAGNOSIS — E1122 Type 2 diabetes mellitus with diabetic chronic kidney disease: Secondary | ICD-10-CM

## 2017-11-02 DIAGNOSIS — M542 Cervicalgia: Secondary | ICD-10-CM | POA: Diagnosis not present

## 2017-11-02 LAB — CBC
HCT: 33.2 % — ABNORMAL LOW (ref 36.0–46.0)
Hemoglobin: 10.4 g/dL — ABNORMAL LOW (ref 12.0–15.0)
MCH: 22.7 pg — ABNORMAL LOW (ref 26.0–34.0)
MCHC: 31.3 g/dL (ref 30.0–36.0)
MCV: 72.3 fL — ABNORMAL LOW (ref 78.0–100.0)
Platelets: 143 10*3/uL — ABNORMAL LOW (ref 150–400)
RBC: 4.59 MIL/uL (ref 3.87–5.11)
RDW: 16.9 % — ABNORMAL HIGH (ref 11.5–15.5)
WBC: 8.2 10*3/uL (ref 4.0–10.5)

## 2017-11-02 LAB — POCT HEMOGLOBIN-HEMACUE: Hemoglobin: 10.9 g/dL — ABNORMAL LOW (ref 12.0–15.0)

## 2017-11-02 LAB — BASIC METABOLIC PANEL
Anion gap: 9 (ref 5–15)
BUN: 42 mg/dL — ABNORMAL HIGH (ref 6–20)
CO2: 20 mmol/L — ABNORMAL LOW (ref 22–32)
Calcium: 8.9 mg/dL (ref 8.9–10.3)
Chloride: 111 mmol/L (ref 101–111)
Creatinine, Ser: 2.44 mg/dL — ABNORMAL HIGH (ref 0.44–1.00)
GFR calc Af Amer: 22 mL/min — ABNORMAL LOW (ref >60)
GFR calc non Af Amer: 19 mL/min — ABNORMAL LOW (ref >60)
Glucose, Bld: 180 mg/dL — ABNORMAL HIGH (ref 65–99)
Potassium: 4.3 mmol/L (ref 3.5–5.1)
Sodium: 140 mmol/L (ref 135–145)

## 2017-11-02 LAB — I-STAT TROPONIN, ED: Troponin i, poc: 0 ng/mL (ref 0.00–0.08)

## 2017-11-02 MED ORDER — EPOETIN ALFA 20000 UNIT/ML IJ SOLN
INTRAMUSCULAR | Status: AC
  Administered 2017-11-02: 20000 [IU]
  Filled 2017-11-02: qty 1

## 2017-11-02 MED ORDER — EPOETIN ALFA 40000 UNIT/ML IJ SOLN: 30000.0000 [IU] | INTRAMUSCULAR | Status: DC

## 2017-11-02 MED ORDER — EPOETIN ALFA 10000 UNIT/ML IJ SOLN
INTRAMUSCULAR | Status: AC
  Administered 2017-11-02: 10000 [IU]
  Filled 2017-11-02: qty 1

## 2017-11-02 NOTE — ED Triage Notes (Signed)
Pt complains of left chest pain that she describes as pressure and left sided neck pain for about 2 hours

## 2017-11-03 ENCOUNTER — Emergency Department (HOSPITAL_COMMUNITY)
Admission: EM | Admit: 2017-11-03 | Discharge: 2017-11-03 | Disposition: A | Discharge status: Home / Self Care | Payer: Medicare Other | Attending: Emergency Medicine | Admitting: Emergency Medicine

## 2017-11-03 NOTE — ED Notes (Signed)
Called pt from lobby for recheck of v/s No response 1st attempt

## 2017-11-16 ENCOUNTER — Encounter (HOSPITAL_COMMUNITY)
Admission: RE | Admit: 2017-11-16 | Discharge: 2017-11-16 | Disposition: A | Discharge status: Home / Self Care | Payer: Medicare Other | Source: Ambulatory Visit | Attending: Nephrology | Admitting: Nephrology

## 2017-11-16 VITALS — BP 132/67 | HR 83 | Temp 98.8°F | Resp 20

## 2017-11-16 DIAGNOSIS — N189 Chronic kidney disease, unspecified: Secondary | ICD-10-CM | POA: Diagnosis not present

## 2017-11-16 DIAGNOSIS — E1122 Type 2 diabetes mellitus with diabetic chronic kidney disease: Secondary | ICD-10-CM | POA: Diagnosis not present

## 2017-11-16 LAB — IRON AND TIBC
Iron: 79 ug/dL (ref 28–170)
Saturation Ratios: 34 % — ABNORMAL HIGH (ref 10.4–31.8)
TIBC: 234 ug/dL — ABNORMAL LOW (ref 250–450)
UIBC: 155 ug/dL

## 2017-11-16 LAB — POCT HEMOGLOBIN-HEMACUE: Hemoglobin: 10.9 g/dL — ABNORMAL LOW (ref 12.0–15.0)

## 2017-11-16 LAB — FERRITIN: Ferritin: 212 ng/mL (ref 11–307)

## 2017-11-16 MED ORDER — EPOETIN ALFA 20000 UNIT/ML IJ SOLN
INTRAMUSCULAR | Status: AC
  Administered 2017-11-16: 20000 [IU]
  Filled 2017-11-16: qty 1

## 2017-11-16 MED ORDER — EPOETIN ALFA 40000 UNIT/ML IJ SOLN: 30000.0000 [IU] | INTRAMUSCULAR | Status: DC

## 2017-11-16 MED ORDER — EPOETIN ALFA 10000 UNIT/ML IJ SOLN
INTRAMUSCULAR | Status: AC
  Administered 2017-11-16: 10000 [IU]
  Filled 2017-11-16: qty 1

## 2017-12-01 ENCOUNTER — Encounter (HOSPITAL_COMMUNITY)
Admission: RE | Admit: 2017-12-01 | Discharge: 2017-12-01 | Disposition: A | Discharge status: Home / Self Care | Payer: Medicare Other | Source: Ambulatory Visit | Attending: Nephrology | Admitting: Nephrology

## 2017-12-01 VITALS — BP 113/76 | HR 91 | Temp 98.7°F | Resp 20

## 2017-12-01 DIAGNOSIS — E1122 Type 2 diabetes mellitus with diabetic chronic kidney disease: Secondary | ICD-10-CM | POA: Diagnosis not present

## 2017-12-01 DIAGNOSIS — N189 Chronic kidney disease, unspecified: Secondary | ICD-10-CM | POA: Diagnosis not present

## 2017-12-01 LAB — POCT HEMOGLOBIN-HEMACUE: Hemoglobin: 11 g/dL — ABNORMAL LOW (ref 12.0–15.0)

## 2017-12-01 MED ORDER — EPOETIN ALFA 10000 UNIT/ML IJ SOLN
INTRAMUSCULAR | Status: AC
  Administered 2017-12-01: 10000 [IU] via SUBCUTANEOUS
  Filled 2017-12-01: qty 1

## 2017-12-01 MED ORDER — EPOETIN ALFA 20000 UNIT/ML IJ SOLN
INTRAMUSCULAR | Status: AC
  Administered 2017-12-01: 20000 [IU] via SUBCUTANEOUS
  Filled 2017-12-01: qty 1

## 2017-12-01 MED ORDER — EPOETIN ALFA 40000 UNIT/ML IJ SOLN: 30000.0000 [IU] | INTRAMUSCULAR | Status: DC

## 2017-12-11 DIAGNOSIS — N2581 Secondary hyperparathyroidism of renal origin: Secondary | ICD-10-CM | POA: Diagnosis not present

## 2017-12-11 DIAGNOSIS — E118 Type 2 diabetes mellitus with unspecified complications: Secondary | ICD-10-CM | POA: Diagnosis not present

## 2017-12-11 DIAGNOSIS — I129 Hypertensive chronic kidney disease with stage 1 through stage 4 chronic kidney disease, or unspecified chronic kidney disease: Secondary | ICD-10-CM | POA: Diagnosis not present

## 2017-12-11 DIAGNOSIS — N184 Chronic kidney disease, stage 4 (severe): Secondary | ICD-10-CM | POA: Diagnosis not present

## 2017-12-11 DIAGNOSIS — D631 Anemia in chronic kidney disease: Secondary | ICD-10-CM | POA: Diagnosis not present

## 2017-12-14 ENCOUNTER — Inpatient Hospital Stay (HOSPITAL_COMMUNITY): Admission: RE | Admit: 2017-12-14 | Payer: Medicare Other | Source: Ambulatory Visit

## 2017-12-16 ENCOUNTER — Ambulatory Visit (HOSPITAL_COMMUNITY)
Admission: RE | Admit: 2017-12-16 | Discharge: 2017-12-16 | Disposition: A | Discharge status: Home / Self Care | Payer: Medicare Other | Source: Ambulatory Visit | Attending: Nephrology | Admitting: Nephrology

## 2017-12-16 VITALS — BP 122/86 | HR 86 | Temp 98.7°F | Resp 16

## 2017-12-16 DIAGNOSIS — D631 Anemia in chronic kidney disease: Secondary | ICD-10-CM | POA: Diagnosis not present

## 2017-12-16 DIAGNOSIS — E1122 Type 2 diabetes mellitus with diabetic chronic kidney disease: Secondary | ICD-10-CM

## 2017-12-16 DIAGNOSIS — N184 Chronic kidney disease, stage 4 (severe): Secondary | ICD-10-CM | POA: Diagnosis not present

## 2017-12-16 LAB — POCT HEMOGLOBIN-HEMACUE: Hemoglobin: 11.2 g/dL — ABNORMAL LOW (ref 12.0–15.0)

## 2017-12-16 MED ORDER — EPOETIN ALFA 40000 UNIT/ML IJ SOLN
30000.0000 [IU] | INTRAMUSCULAR | Status: DC
Start: 2017-12-16 — End: 2017-12-17

## 2017-12-16 MED ORDER — EPOETIN ALFA 10000 UNIT/ML IJ SOLN
INTRAMUSCULAR | Status: AC
  Administered 2017-12-16: 10000 [IU]
  Filled 2017-12-16: qty 1

## 2017-12-16 MED ORDER — EPOETIN ALFA 20000 UNIT/ML IJ SOLN
INTRAMUSCULAR | Status: AC
  Administered 2017-12-16: 20000 [IU] via SUBCUTANEOUS
  Filled 2017-12-16: qty 1

## 2017-12-28 ENCOUNTER — Ambulatory Visit (HOSPITAL_COMMUNITY)
Admission: RE | Admit: 2017-12-28 | Discharge: 2017-12-28 | Disposition: A | Discharge status: Home / Self Care | Payer: Medicare Other | Source: Ambulatory Visit | Attending: Nephrology | Admitting: Nephrology

## 2017-12-28 ENCOUNTER — Encounter (HOSPITAL_COMMUNITY): Payer: Medicare Other

## 2017-12-28 VITALS — BP 123/72 | HR 80 | Temp 98.0°F | Resp 18

## 2017-12-28 DIAGNOSIS — E1165 Type 2 diabetes mellitus with hyperglycemia: Secondary | ICD-10-CM | POA: Diagnosis not present

## 2017-12-28 DIAGNOSIS — E1122 Type 2 diabetes mellitus with diabetic chronic kidney disease: Secondary | ICD-10-CM

## 2017-12-28 DIAGNOSIS — D631 Anemia in chronic kidney disease: Secondary | ICD-10-CM | POA: Insufficient documentation

## 2017-12-28 DIAGNOSIS — Z794 Long term (current) use of insulin: Secondary | ICD-10-CM | POA: Diagnosis not present

## 2017-12-28 DIAGNOSIS — N184 Chronic kidney disease, stage 4 (severe): Secondary | ICD-10-CM | POA: Insufficient documentation

## 2017-12-28 DIAGNOSIS — I10 Essential (primary) hypertension: Secondary | ICD-10-CM | POA: Diagnosis not present

## 2017-12-28 LAB — IRON AND TIBC
Iron: 77 ug/dL (ref 28–170)
Saturation Ratios: 31 % (ref 10.4–31.8)
TIBC: 249 ug/dL — ABNORMAL LOW (ref 250–450)
UIBC: 172 ug/dL

## 2017-12-28 LAB — FERRITIN: Ferritin: 178 ng/mL (ref 11–307)

## 2017-12-28 LAB — POCT HEMOGLOBIN-HEMACUE: Hemoglobin: 11.3 g/dL — ABNORMAL LOW (ref 12.0–15.0)

## 2017-12-28 MED ORDER — EPOETIN ALFA 40000 UNIT/ML IJ SOLN
30000.0000 [IU] | INTRAMUSCULAR | Status: DC
Start: 2017-12-28 — End: 2017-12-29

## 2017-12-28 MED ORDER — EPOETIN ALFA 20000 UNIT/ML IJ SOLN
INTRAMUSCULAR | Status: AC
  Administered 2017-12-28: 20000 [IU] via SUBCUTANEOUS
  Filled 2017-12-28: qty 1

## 2017-12-28 MED ORDER — EPOETIN ALFA 10000 UNIT/ML IJ SOLN
INTRAMUSCULAR | Status: AC
  Administered 2017-12-28: 10000 [IU] via SUBCUTANEOUS
  Filled 2017-12-28: qty 1

## 2017-12-31 ENCOUNTER — Other Ambulatory Visit: Payer: Self-pay | Admitting: Obstetrics and Gynecology

## 2017-12-31 ENCOUNTER — Other Ambulatory Visit: Payer: Self-pay | Admitting: Internal Medicine

## 2017-12-31 DIAGNOSIS — Z1231 Encounter for screening mammogram for malignant neoplasm of breast: Secondary | ICD-10-CM

## 2018-01-11 ENCOUNTER — Ambulatory Visit (HOSPITAL_COMMUNITY)
Admission: RE | Admit: 2018-01-11 | Discharge: 2018-01-11 | Disposition: A | Discharge status: Home / Self Care | Payer: Medicare Other | Source: Ambulatory Visit | Attending: Nephrology | Admitting: Nephrology

## 2018-01-11 VITALS — BP 150/81 | HR 85 | Temp 98.0°F | Resp 20

## 2018-01-11 DIAGNOSIS — Z5181 Encounter for therapeutic drug level monitoring: Secondary | ICD-10-CM | POA: Diagnosis not present

## 2018-01-11 DIAGNOSIS — E1122 Type 2 diabetes mellitus with diabetic chronic kidney disease: Secondary | ICD-10-CM

## 2018-01-11 DIAGNOSIS — Z79899 Other long term (current) drug therapy: Secondary | ICD-10-CM | POA: Diagnosis not present

## 2018-01-11 DIAGNOSIS — D631 Anemia in chronic kidney disease: Secondary | ICD-10-CM | POA: Diagnosis not present

## 2018-01-11 DIAGNOSIS — N184 Chronic kidney disease, stage 4 (severe): Secondary | ICD-10-CM | POA: Insufficient documentation

## 2018-01-11 LAB — POCT HEMOGLOBIN-HEMACUE: Hemoglobin: 11.3 g/dL — ABNORMAL LOW (ref 12.0–15.0)

## 2018-01-11 MED ORDER — EPOETIN ALFA 40000 UNIT/ML IJ SOLN: 30000.0000 [IU] | INTRAMUSCULAR | Status: DC

## 2018-01-11 MED ORDER — EPOETIN ALFA 10000 UNIT/ML IJ SOLN
INTRAMUSCULAR | Status: AC
  Administered 2018-01-11: 10000 [IU] via SUBCUTANEOUS
  Filled 2018-01-11: qty 1

## 2018-01-11 MED ORDER — EPOETIN ALFA 20000 UNIT/ML IJ SOLN
INTRAMUSCULAR | Status: AC
  Administered 2018-01-11: 20000 [IU] via SUBCUTANEOUS
  Filled 2018-01-11: qty 1

## 2018-01-20 ENCOUNTER — Ambulatory Visit
Admission: RE | Admit: 2018-01-20 | Discharge: 2018-01-20 | Disposition: A | Discharge status: Home / Self Care | Payer: Medicare Other | Source: Ambulatory Visit | Attending: Obstetrics and Gynecology | Admitting: Obstetrics and Gynecology

## 2018-01-20 DIAGNOSIS — Z1231 Encounter for screening mammogram for malignant neoplasm of breast: Secondary | ICD-10-CM

## 2018-01-21 ENCOUNTER — Other Ambulatory Visit: Payer: Self-pay | Admitting: Obstetrics and Gynecology

## 2018-01-21 DIAGNOSIS — R921 Mammographic calcification found on diagnostic imaging of breast: Secondary | ICD-10-CM

## 2018-01-25 ENCOUNTER — Ambulatory Visit (HOSPITAL_COMMUNITY)
Admission: RE | Admit: 2018-01-25 | Discharge: 2018-01-25 | Disposition: A | Discharge status: Home / Self Care | Payer: Medicare Other | Source: Ambulatory Visit | Attending: Nephrology | Admitting: Nephrology

## 2018-01-25 ENCOUNTER — Other Ambulatory Visit: Payer: Self-pay

## 2018-01-25 ENCOUNTER — Other Ambulatory Visit: Payer: Self-pay | Admitting: Obstetrics and Gynecology

## 2018-01-25 VITALS — BP 114/75 | HR 92 | Temp 97.8°F | Resp 18 | Ht 68.0 in | Wt 180.0 lb

## 2018-01-25 DIAGNOSIS — D631 Anemia in chronic kidney disease: Secondary | ICD-10-CM | POA: Diagnosis present

## 2018-01-25 DIAGNOSIS — R921 Mammographic calcification found on diagnostic imaging of breast: Secondary | ICD-10-CM

## 2018-01-25 DIAGNOSIS — N184 Chronic kidney disease, stage 4 (severe): Secondary | ICD-10-CM | POA: Diagnosis not present

## 2018-01-25 DIAGNOSIS — E1122 Type 2 diabetes mellitus with diabetic chronic kidney disease: Secondary | ICD-10-CM

## 2018-01-25 MED ORDER — EPOETIN ALFA 40000 UNIT/ML IJ SOLN: 30000.0000 [IU] | INTRAMUSCULAR | Status: DC

## 2018-01-25 MED ORDER — EPOETIN ALFA 10000 UNIT/ML IJ SOLN
INTRAMUSCULAR | Status: AC
  Administered 2018-01-25: 10000 [IU]
  Filled 2018-01-25: qty 1

## 2018-01-25 MED ORDER — EPOETIN ALFA 20000 UNIT/ML IJ SOLN
INTRAMUSCULAR | Status: AC
  Administered 2018-01-25: 20000 [IU]
  Filled 2018-01-25: qty 1

## 2018-01-26 ENCOUNTER — Ambulatory Visit
Admission: RE | Admit: 2018-01-26 | Discharge: 2018-01-26 | Disposition: A | Discharge status: Home / Self Care | Payer: Medicare Other | Source: Ambulatory Visit | Attending: Obstetrics and Gynecology | Admitting: Obstetrics and Gynecology

## 2018-01-26 DIAGNOSIS — R921 Mammographic calcification found on diagnostic imaging of breast: Secondary | ICD-10-CM

## 2018-01-26 LAB — POCT HEMOGLOBIN-HEMACUE: Hemoglobin: 11.7 g/dL — ABNORMAL LOW (ref 12.0–15.0)

## 2018-02-08 ENCOUNTER — Encounter (HOSPITAL_COMMUNITY)
Admission: RE | Admit: 2018-02-08 | Discharge: 2018-02-08 | Disposition: A | Discharge status: Home / Self Care | Payer: Medicare Other | Source: Ambulatory Visit | Attending: Nephrology | Admitting: Nephrology

## 2018-02-08 VITALS — BP 126/60 | HR 73 | Temp 98.2°F | Resp 20

## 2018-02-08 DIAGNOSIS — N184 Chronic kidney disease, stage 4 (severe): Secondary | ICD-10-CM | POA: Insufficient documentation

## 2018-02-08 DIAGNOSIS — D631 Anemia in chronic kidney disease: Secondary | ICD-10-CM | POA: Diagnosis not present

## 2018-02-08 DIAGNOSIS — E1122 Type 2 diabetes mellitus with diabetic chronic kidney disease: Secondary | ICD-10-CM

## 2018-02-08 LAB — IRON AND TIBC
Iron: 90 ug/dL (ref 28–170)
Saturation Ratios: 35 % — ABNORMAL HIGH (ref 10.4–31.8)
TIBC: 255 ug/dL (ref 250–450)
UIBC: 165 ug/dL

## 2018-02-08 LAB — FERRITIN: Ferritin: 120 ng/mL (ref 11–307)

## 2018-02-08 LAB — POCT HEMOGLOBIN-HEMACUE: Hemoglobin: 11.2 g/dL — ABNORMAL LOW (ref 12.0–15.0)

## 2018-02-08 MED ORDER — EPOETIN ALFA 10000 UNIT/ML IJ SOLN
INTRAMUSCULAR | Status: AC
  Administered 2018-02-08: 10000 [IU]
  Filled 2018-02-08: qty 1

## 2018-02-08 MED ORDER — EPOETIN ALFA 20000 UNIT/ML IJ SOLN
INTRAMUSCULAR | Status: AC
  Administered 2018-02-08: 20000 [IU]
  Filled 2018-02-08: qty 1

## 2018-02-08 MED ORDER — EPOETIN ALFA 40000 UNIT/ML IJ SOLN: 30000.0000 [IU] | INTRAMUSCULAR | Status: DC

## 2018-02-19 ENCOUNTER — Other Ambulatory Visit (HOSPITAL_COMMUNITY): Payer: Self-pay

## 2018-02-22 ENCOUNTER — Encounter (HOSPITAL_COMMUNITY)
Admission: RE | Admit: 2018-02-22 | Discharge: 2018-02-22 | Disposition: A | Discharge status: Home / Self Care | Payer: Medicare Other | Source: Ambulatory Visit | Attending: Nephrology | Admitting: Nephrology

## 2018-02-22 ENCOUNTER — Encounter (HOSPITAL_COMMUNITY): Payer: Medicare Other

## 2018-02-22 VITALS — BP 122/74 | HR 65 | Temp 97.1°F | Resp 18

## 2018-02-22 DIAGNOSIS — N184 Chronic kidney disease, stage 4 (severe): Secondary | ICD-10-CM | POA: Diagnosis not present

## 2018-02-22 DIAGNOSIS — E1122 Type 2 diabetes mellitus with diabetic chronic kidney disease: Secondary | ICD-10-CM

## 2018-02-22 LAB — POCT HEMOGLOBIN-HEMACUE: Hemoglobin: 11.2 g/dL — ABNORMAL LOW (ref 12.0–15.0)

## 2018-02-22 MED ORDER — EPOETIN ALFA 10000 UNIT/ML IJ SOLN
INTRAMUSCULAR | Status: AC
  Administered 2018-02-22: 10000 [IU]
  Filled 2018-02-22: qty 1

## 2018-02-22 MED ORDER — EPOETIN ALFA 40000 UNIT/ML IJ SOLN: 30000.0000 [IU] | INTRAMUSCULAR | Status: DC

## 2018-02-22 MED ORDER — EPOETIN ALFA 20000 UNIT/ML IJ SOLN
INTRAMUSCULAR | Status: AC
  Administered 2018-02-22: 20000 [IU] via SUBCUTANEOUS
  Filled 2018-02-22: qty 1

## 2018-03-02 ENCOUNTER — Ambulatory Visit: Payer: Medicare Other | Admitting: Internal Medicine

## 2018-03-02 ENCOUNTER — Encounter: Payer: Self-pay | Admitting: Internal Medicine

## 2018-03-02 ENCOUNTER — Encounter

## 2018-03-02 VITALS — BP 126/68 | HR 76 | Ht 65.0 in | Wt 180.0 lb

## 2018-03-02 DIAGNOSIS — I2583 Coronary atherosclerosis due to lipid rich plaque: Secondary | ICD-10-CM

## 2018-03-02 DIAGNOSIS — I351 Nonrheumatic aortic (valve) insufficiency: Secondary | ICD-10-CM | POA: Diagnosis not present

## 2018-03-02 DIAGNOSIS — I2510 Atherosclerotic heart disease of native coronary artery without angina pectoris: Secondary | ICD-10-CM | POA: Diagnosis not present

## 2018-03-02 DIAGNOSIS — I10 Essential (primary) hypertension: Secondary | ICD-10-CM

## 2018-03-02 NOTE — Progress Notes (Signed)
OFFICE NOTE  Chief Complaint:  Routine follow-up  Primary Care Physician: Patient, No Pcp Per  HPI:  Miranda Roman is a 72 year old female with a history of mild coronary disease by cath in 2007 at the ostium of the left circumflex, also has insulin dependent diabetes, hypertension, dyslipidemia, and morbid obesity; however, she underwent weight loss surgery and had significant improvement in her weight. Recently she has been struggling with a sinus infection, is on some prednisone. Blood pressure was elevated a little bit today at 140/70; however, she also had not taken her blood pressure medication.   Miranda Roman returns today and is without complaints. She denies any shortness of breath, chest pain or palpitations .  I had the pleasure of seeing Miranda Roman back in the office today. Recently she was seen by her primary care provider and had complained of some chest pain. This was occurring over a couple of days and noted mostly at rest however sometimes with exertion. She reports not being particularly active. She says it is since resolved and she's not had recurrence. It was not necessarily associated with eating or similar to her reflux symptoms in the past. She did not take medication for it. Her last stress test was in 2010. She does have mild coronary artery disease based on catheterization in 2007.  02/11/2016  Miranda Roman returns today for follow-up. She has no chest pain or worsening shortness of breath. In general she's done fairly well. Blood pressures been well controlled at home and was 131/84 today which she says is "high" for her. She's had no problems with atorvastatin. She takes quinapril/HCTZ. She recently had blood work through her primary care provider in a complete physical and we'll request those numbers. We are monitoring long-term for aortic insufficiency which was seen on her last echo in August 2016.  08/26/2016  Miranda Roman returns today at the request  of Dr. Baird Cancer for reevaluation of chest pain. She has had similar pain like this in the past. She underwent nuclear stress test in 2016 which was low risk and negative for ischemia. Recently some of her symptoms seem to be worse in exaggerated by eating foods. She had been previously told that she had reflux symptoms but was not on any medications. She does have a history of gastric bypass surgery. I wonder if she could have a hiatal hernia that is acting up. She was started on omeprazole last week and notes that she's not had any further symptoms since then.  02/24/2017  Miranda Roman was seen today in follow-up. I last saw her in February where she had some pain in the back of her chest which radiated around to the front. She says that ultimately she lightened up her purse and she noted improvement in her symptoms. It could be that she had thoracic radiculopathy. She just had a repeat echo which shows a stable normal LVEF and mild aortic insufficiency. She denies any recurrent chest pain symptoms.  03/02/2018  Miranda Roman is seen today in routine annual follow-up.  Over the past year she denies any new chest pain or worsening shortness of breath.  She has had issues with pain in the left shoulder and neck at times which was thought to be due to radiculopathy.  Echo had shown mild aortic insufficiency in the past.  Blood pressures well controlled today.  Lab work in April 2019 showed total cholesterol 131, HDL 42, LDL 74 and triglycerides 77.  Hemoglobin A1c is above target  at 8.5.  PMHx:  Past Medical History:  Diagnosis Date  . Anemia   . Diabetes mellitus   . Hyperlipidemia   . Hypertension   . Osteoporosis   . Sickle cell anemia (HCC)     Past Surgical History:  Procedure Laterality Date  . BONE RESECTION  01/2006   wrist  . 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 TUBAL LIGATION  04/28/1977    FAMHx:  Family History  Problem Relation Age of Onset  . Diabetes Mother   . Heart disease Mother    . Diabetes Father   . Heart disease Father   . Diabetes Sister   . Diabetes Maternal Aunt   . Diabetes Maternal Uncle   . Diabetes Maternal Grandmother   . Diabetes Maternal Grandfather     SOCHx:   reports that she quit smoking about 13 years ago. She has never used smokeless tobacco. She reports that she drinks about 2.0 standard drinks of alcohol per week. She reports that she does not use drugs.  ALLERGIES:  No Known Allergies  ROS: Pertinent items noted in HPI and remainder of comprehensive ROS otherwise negative.  HOME MEDS: Current Outpatient Medications  Medication Sig Dispense Refill  . aspirin 81 MG tablet Take 81 mg by mouth daily.    [name] atorvastatin (LIPITOR) 40 MG tablet Take 40 mg by mouth daily.    . Biotin (BIOTIN 5000) 5 MG CAPS Take by mouth daily.    . calcitRIOL (ROCALTROL) 0.25 MCG capsule Take 0.25 mcg by mouth as directed.     . cholecalciferol (VITAMIN D) 1000 UNITS tablet Take 1,000 Units by mouth daily.    [name] epoetin alfa (EPOGEN,PROCRIT) 27253 UNIT/ML injection 10,000 Units 3 (three) times a week. Dose unknown    . Glucosamine-Chondroitin (GLUCOSAMINE CHONDR COMPLEX PO) Take 1 tablet by mouth daily.     . insulin detemir (LEVEMIR) 100 UNIT/ML injection Inject 12 Units into the skin at bedtime.     . insulin lispro (HUMALOG) 100 UNIT/ML injection Inject 4 Units into the skin 1 day or 1 dose.    . Probiotic Product (PROBIOTIC ADVANCED PO) Take by mouth.    . quinapril-hydrochlorothiazide (ACCURETIC) 20-12.5 MG per tablet Take 0.5 tablets by mouth daily.      No current facility-administered medications for this visit.     LABS/IMAGING: No results found for this or any previous visit (from the past 48 hour(s)). No results found.  VITALS: BP 126/68 (BP Location: Left Arm, Patient Position: Sitting, Cuff Size: Normal)   Pulse 76   Ht _0  (1.651 m)   Wt 180 lb (81.6 kg)   BMI 29.95 kg/m   EXAM: General appearance: alert and no distress Neck:  no adenopathy, no carotid bruit, no JVD, supple, symmetrical, trachea midline and thyroid not enlarged, symmetric, no tenderness/mass/nodules Lungs: clear to auscultation bilaterally Heart: regular rate and rhythm, S1, S2 normal and diastolic murmur: early diastolic 3/6, blowing at 2nd right intercostal space Abdomen: soft, non-tender; bowel sounds normal; no masses,  no organomegaly Extremities: extremities normal, atraumatic, no cyanosis or edema Pulses: 2+ and symmetric Skin: Skin color, texture, turgor normal. No rashes or lesions Neurologic: Grossly normal  EKG: Sinus rhythm 76-personally reviewed  ASSESSMENT: 1. Chest pain at rest- low risk myoview 2016 - may have been a thoracic radiculopathy 2. History of morbid obesity status post bariatric surgery 3. Diabetes, no longer insulin-dependent 4. Hypertension - managed by primary 5. 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  Ms. Hassan has had no significant recurrent chest pain concerning for coronary disease.  She has diabetes with hemoglobin A1c 8.5 which will need to be better managed.  Her blood pressure is at goal and dyslipidemia is reasonably well controlled.  Goal LDL is just above 70.  She does have AI which will monitor clinically.  She seems to be maintaining her weight after history of bariatric surgery.  Plan follow-up with me annually or sooner as necessary.  Pixie Casino, MD, [facility], [place] Director of the [facility] Lip[facility] &  Card[facility] Diplomate of the American Board of Clinical Lipidology Attending Cardiologist  Direct Dial: [phone]  Fax: [phone]  Website:  [email or web address] 03/02/2018, 9:39 AM

## 2018-03-02 NOTE — Patient Instructions (Signed)
Your physician wants you to follow-up in: ONE YEAR with Dr. Debara Pickett. You will receive a reminder letter in the mail two months in advance. If you don't receive a letter, please call our office to schedule the follow-up appointment.

## 2018-03-09 ENCOUNTER — Encounter (HOSPITAL_COMMUNITY)
Admission: RE | Admit: 2018-03-09 | Discharge: 2018-03-09 | Disposition: A | Discharge status: Home / Self Care | Payer: Medicare Other | Source: Ambulatory Visit | Attending: Nephrology | Admitting: Nephrology

## 2018-03-09 VITALS — BP 142/83 | HR 72 | Temp 97.8°F | Resp 20

## 2018-03-09 DIAGNOSIS — D631 Anemia in chronic kidney disease: Secondary | ICD-10-CM | POA: Diagnosis not present

## 2018-03-09 DIAGNOSIS — N184 Chronic kidney disease, stage 4 (severe): Secondary | ICD-10-CM | POA: Diagnosis not present

## 2018-03-09 DIAGNOSIS — E1122 Type 2 diabetes mellitus with diabetic chronic kidney disease: Secondary | ICD-10-CM

## 2018-03-09 LAB — POCT HEMOGLOBIN-HEMACUE: Hemoglobin: 11.7 g/dL — ABNORMAL LOW (ref 12.0–15.0)

## 2018-03-09 MED ORDER — EPOETIN ALFA 40000 UNIT/ML IJ SOLN: 30000.0000 [IU] | INTRAMUSCULAR | Status: DC

## 2018-03-09 MED ORDER — EPOETIN ALFA 20000 UNIT/ML IJ SOLN
INTRAMUSCULAR | Status: AC
  Administered 2018-03-09: 20000 [IU] via SUBCUTANEOUS
  Filled 2018-03-09: qty 1

## 2018-03-09 MED ORDER — EPOETIN ALFA 10000 UNIT/ML IJ SOLN
INTRAMUSCULAR | Status: AC
  Administered 2018-03-09: 10000 [IU] via SUBCUTANEOUS
  Filled 2018-03-09: qty 1

## 2018-03-23 ENCOUNTER — Encounter (HOSPITAL_COMMUNITY)
Admission: RE | Admit: 2018-03-23 | Discharge: 2018-03-23 | Disposition: A | Discharge status: Home / Self Care | Payer: Medicare Other | Source: Ambulatory Visit | Attending: Nephrology | Admitting: Nephrology

## 2018-03-23 ENCOUNTER — Encounter (HOSPITAL_COMMUNITY): Payer: Medicare Other

## 2018-03-23 VITALS — BP 115/74 | HR 76 | Temp 98.2°F | Resp 20

## 2018-03-23 DIAGNOSIS — N184 Chronic kidney disease, stage 4 (severe): Secondary | ICD-10-CM | POA: Diagnosis not present

## 2018-03-23 DIAGNOSIS — E1122 Type 2 diabetes mellitus with diabetic chronic kidney disease: Secondary | ICD-10-CM

## 2018-03-23 LAB — FERRITIN: Ferritin: 180 ng/mL (ref 11–307)

## 2018-03-23 LAB — POCT HEMOGLOBIN-HEMACUE: Hemoglobin: 11.4 g/dL — ABNORMAL LOW (ref 12.0–15.0)

## 2018-03-23 LAB — IRON AND TIBC
Iron: 83 ug/dL (ref 28–170)
Saturation Ratios: 31 % (ref 10.4–31.8)
TIBC: 269 ug/dL (ref 250–450)
UIBC: 186 ug/dL

## 2018-03-23 MED ORDER — EPOETIN ALFA 40000 UNIT/ML IJ SOLN: 30000.0000 [IU] | INTRAMUSCULAR | Status: DC

## 2018-03-23 MED ORDER — EPOETIN ALFA 20000 UNIT/ML IJ SOLN
INTRAMUSCULAR | Status: AC
  Administered 2018-03-23: 20000 [IU] via SUBCUTANEOUS
  Filled 2018-03-23: qty 1

## 2018-03-23 MED ORDER — EPOETIN ALFA 10000 UNIT/ML IJ SOLN
INTRAMUSCULAR | Status: AC
  Administered 2018-03-23: 10000 [IU] via SUBCUTANEOUS
  Filled 2018-03-23: qty 1

## 2018-04-04 ENCOUNTER — Encounter: Payer: Self-pay | Admitting: Internal Medicine

## 2018-04-04 DIAGNOSIS — N184 Chronic kidney disease, stage 4 (severe): Secondary | ICD-10-CM

## 2018-04-04 DIAGNOSIS — I129 Hypertensive chronic kidney disease with stage 1 through stage 4 chronic kidney disease, or unspecified chronic kidney disease: Secondary | ICD-10-CM

## 2018-04-06 ENCOUNTER — Encounter (HOSPITAL_COMMUNITY)
Admission: RE | Admit: 2018-04-06 | Discharge: 2018-04-06 | Disposition: A | Discharge status: Home / Self Care | Payer: Medicare Other | Source: Ambulatory Visit | Attending: Nephrology | Admitting: Nephrology

## 2018-04-06 DIAGNOSIS — N184 Chronic kidney disease, stage 4 (severe): Secondary | ICD-10-CM | POA: Diagnosis not present

## 2018-04-06 DIAGNOSIS — D631 Anemia in chronic kidney disease: Secondary | ICD-10-CM | POA: Diagnosis present

## 2018-04-06 LAB — POCT HEMOGLOBIN-HEMACUE: Hemoglobin: 11.2 g/dL — ABNORMAL LOW (ref 12.0–15.0)

## 2018-04-06 MED ORDER — EPOETIN ALFA 10000 UNIT/ML IJ SOLN
INTRAMUSCULAR | Status: AC
  Administered 2018-04-06: 10000 [IU] via SUBCUTANEOUS
  Filled 2018-04-06: qty 1

## 2018-04-06 MED ORDER — EPOETIN ALFA 20000 UNIT/ML IJ SOLN
INTRAMUSCULAR | Status: AC
  Administered 2018-04-06: 20000 [IU] via SUBCUTANEOUS
  Filled 2018-04-06: qty 1

## 2018-04-09 ENCOUNTER — Encounter: Payer: Self-pay | Admitting: Internal Medicine

## 2018-04-09 ENCOUNTER — Ambulatory Visit (INDEPENDENT_AMBULATORY_CARE_PROVIDER_SITE_OTHER): Payer: Medicare Other | Admitting: Internal Medicine

## 2018-04-09 VITALS — BP 126/74 | HR 76 | Temp 98.4°F | Ht 64.5 in | Wt 179.4 lb

## 2018-04-09 DIAGNOSIS — E559 Vitamin D deficiency, unspecified: Secondary | ICD-10-CM

## 2018-04-09 DIAGNOSIS — I10 Essential (primary) hypertension: Secondary | ICD-10-CM

## 2018-04-09 DIAGNOSIS — E1122 Type 2 diabetes mellitus with diabetic chronic kidney disease: Secondary | ICD-10-CM

## 2018-04-09 DIAGNOSIS — I129 Hypertensive chronic kidney disease with stage 1 through stage 4 chronic kidney disease, or unspecified chronic kidney disease: Secondary | ICD-10-CM

## 2018-04-09 DIAGNOSIS — Z0000 Encounter for general adult medical examination without abnormal findings: Secondary | ICD-10-CM

## 2018-04-09 DIAGNOSIS — N184 Chronic kidney disease, stage 4 (severe): Secondary | ICD-10-CM

## 2018-04-09 DIAGNOSIS — Z7982 Long term (current) use of aspirin: Secondary | ICD-10-CM

## 2018-04-09 DIAGNOSIS — Z794 Long term (current) use of insulin: Secondary | ICD-10-CM

## 2018-04-09 LAB — POCT URINALYSIS DIPSTICK
Bilirubin, UA: NEGATIVE
Blood, UA: NEGATIVE
Glucose, UA: POSITIVE — AB
Ketones, UA: NEGATIVE
Leukocytes, UA: NEGATIVE
Nitrite, UA: NEGATIVE
Protein, UA: POSITIVE — AB
Spec Grav, UA: 1.01 (ref 1.010–1.025)
Urobilinogen, UA: 0.2 E.U./dL
pH, UA: 5.5 (ref 5.0–8.0)

## 2018-04-09 LAB — POCT UA - MICROALBUMIN
Creatinine, POC: 200 mg/dL
Microalbumin Ur, POC: 150 mg/L

## 2018-04-09 NOTE — Progress Notes (Deleted)
Men's preventive visit. Patient Health Questionnaire (PHQ-2) is    Office Visit from 04/09/2018 in [facility]  PHQ-2 Total Score  0    . Patient is on a *** diet. Marital status: Widowed. Relevant history for alcohol use is:  Social History   Substance and Sexual Activity  Alcohol Use Yes  . Alcohol/week: 2.0 standard drinks  . Types: 1 Glasses of wine, 1 Cans of beer per week  . Relevant history for tobacco use is:  Social History   Tobacco Use  Smoking Status Former Smoker  . Last attempt to quit: 03/10/2004  . Years since quitting: 14.0  Smokeless Tobacco Never Used  .

## 2018-04-09 NOTE — Progress Notes (Signed)
Patient is postmenopausal. Negative for: breast discharge, breast lump(s), breast pain and breast self exam. Associated symptoms include abnormal vaginal bleeding. Pertinent negatives include abnormal bleeding (hematology), anxiety, decreased libido, depression, difficulty falling sleep, dyspareunia, history of infertility, nocturia, sexual dysfunction, sleep disturbances, urinary incontinence, urinary urgency, vaginal discharge and vaginal itching. Diet regular.The patient states her exercise level is  MINIMAL.   . The patient's tobacco use is:  Social History   Tobacco Use  Smoking Status Former Smoker  . Last attempt to quit: 03/10/2004  . Years since quitting: 14.0  Smokeless Tobacco Never Used  . She has been exposed to passive smoke. The patient's alcohol use is:  Social History   Substance and Sexual Activity  Alcohol Use Yes  . Alcohol/week: 2.0 standard drinks  . Types: 1 Glasses of wine, 1 Cans of beer per week     Subjective:     Patient ID: Miranda Roman , female    DOB: 03/24/1946 , 72 y.o.   MRN: [id number]   SHE IS HERE TODAY FOR FULL PHYSICAL EXAM. SHE HAS NO SPECIFIC CONCERNS AT THIS TIME.     Past Medical History:  Diagnosis Date  . Anemia   . Diabetes mellitus   . Hyperlipidemia   . Hypertension   . Osteoporosis   . Sickle cell anemia (HCC)       Current Outpatient Medications:  .  aspirin 81 MG tablet, Take 81 mg by mouth daily., Disp: , Rfl:  .  atorvastatin (LIPITOR) 40 MG tablet, Take 40 mg by mouth daily., Disp: , Rfl:  .  Biotin (BIOTIN 5000) 5 MG CAPS, Take by mouth daily., Disp: , Rfl:  .  epoetin alfa (EPOGEN,PROCRIT) 32355 UNIT/ML injection, 10,000 Units 3 (three) times a week. Dose unknown, Disp: , Rfl:  .  Glucosamine-Chondroitin (GLUCOSAMINE CHONDR COMPLEX PO), Take 1 tablet by mouth daily. , Disp: , Rfl:  .  glucose blood (ONETOUCH VERIO) test strip, 1 each by Other route daily. Use as instructed, Disp: , Rfl:  .  insulin detemir  (LEVEMIR) 100 UNIT/ML injection, Inject 12 Units into the skin at bedtime. , Disp: , Rfl:  .  insulin lispro (HUMALOG) 100 UNIT/ML injection, Inject 4 Units into the skin 1 day or 1 dose., Disp: , Rfl:  .  ONETOUCH DELICA LANCETS 73U MISC, by Does not apply route daily., Disp: , Rfl:  .  Probiotic Product (PROBIOTIC ADVANCED PO), Take by mouth., Disp: , Rfl:  .  quinapril-hydrochlorothiazide (ACCURETIC) 20-12.5 MG per tablet, Take 0.5 tablets by mouth daily. , Disp: , Rfl:    Review of Systems  Constitutional: Negative.   HENT: Negative.   Eyes: Negative.   Respiratory: Negative.   Cardiovascular: Negative.   Genitourinary: Negative.   Neurological: Negative.   Psychiatric/Behavioral: Negative.      Today's Vitals   04/09/18 1433  BP: 126/74  Pulse: 76  Temp: 98.4 F (36.9 C)  TempSrc: Oral  Weight: 179 lb 6.4 oz (81.4 kg)  Height: 5' 4.5" (1.638 m)  PainSc: 0-No pain   Body mass index is 30.32 kg/m.   Objective:  Physical Exam  Constitutional: She is oriented to person, place, and time. She appears well-developed and well-nourished.  HENT:  Head: Normocephalic and atraumatic.  Right Ear: External ear normal.  Left Ear: External ear normal.  Nose: Nose normal.  Mouth/Throat: Oropharynx is clear and moist.  Eyes: Pupils are equal, round, and reactive to light. Conjunctivae and EOM are normal.  Neck: Normal  range of motion. Neck supple.  Cardiovascular: Normal rate, regular rhythm, normal heart sounds and intact distal pulses.  Pulses:      Dorsalis pedis pulses are 1+ on the right side, and 1+ on the left side.  Pulmonary/Chest: Effort normal and breath sounds normal.  Abdominal: Soft. Bowel sounds are normal.  Musculoskeletal: Normal range of motion.       Right foot: There is normal range of motion.       Left foot: There is normal range of motion.  Feet:  Right Foot:  Protective Sensation: 4 sites tested. 4 sites sensed.  Skin Integrity: Positive for callus.  Negative for erythema.  Left Foot:  Protective Sensation: 4 sites tested. 4 sites sensed.  Skin Integrity: Positive for callus. Negative for erythema.  Neurological: She is alert and oriented to person, place, and time.  Skin: Skin is warm and dry.  Psychiatric: She has a normal mood and affect. Her behavior is normal.  Nursing note and vitals reviewed.       Assessment And Plan:     1. Routine general medical examination at a health care facility A FULL EXAM WAS PERFORMED.  IMPORTANCE OF MONTHLY SELF BREAST EXAMS WAS DISCUSSED WITH THE PATIENT.  PATIENT HAS BEEN ADVISED TO GET 30-45 MINUTES REGULAR EXERCISE NO LESS THAN FOUR TO FIVE DAYS PER WEEK - BOTH WEIGHTBEARING EXERCISES AND AEROBIC ARE RECOMMENDED.  SHE WAS ADVISED TO FOLLOW A HEALTHY DIET WITH AT LEAST SIX FRUITS/VEGGIES PER DAY, DECREASE INTAKE OF RED MEAT, AND TO INCREASE FISH INTAKE TO TWO DAYS PER WEEK.  MEATS/FISH SHOULD NOT BE FRIED, BAKED OR BROILED IS PREFERABLE.  I SUGGEST WEARING SPF 50 SUNSCREEN ON EXPOSED PARTS AND ESPECIALLY WHEN IN THE DIRECT SUNLIGHT FOR AN EXTENDED PERIOD OF TIME.  PLEASE AVOID FAST FOOD RESTAURANTS AND INCREASE YOUR WATER INTAKE.  2. 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  I DISCUSSED WITH THE PATIENT AT LENGTH REGARDING THE GOALS OF GLYCEMIC CONTROL AND POSSIBLE LONG-TERM COMPLICATIONS.  I  ALSO STRESSED THE IMPORTANCE OF COMPLIANCE WITH HOME GLUCOSE MONITORING, DIETARY RESTRICTIONS INCLUDING AVOIDANCE OF SUGARY DRINKS/PROCESSED FOODS,  ALONG WITH REGULAR EXERCISE.  I  ALSO STRESSED THE IMPORTANCE OF ANNUAL EYE EXAMS, SELF FOOT CARE AND COMPLIANCE WITH OFFICE VISITS. - CMP14+EGFR - CBC no Diff - Lipid Profile  3. Chronic kidney disease, stage IV (severe) (HCC) CHRONIC. I DISCUSSED THE VARIOUS STAGES OF CHRONIC KIDNEY DISEASE AND THEIR CURRENT STAGE.  I ALSO DISCUSSED IMPORTANCE OF BP, BS CONTROL AND ADEQUATE HYDRATION.  4. Hypertensive  nephropathy WELL CONTROLLED. SHE WILL CONTINUE WITH CURRENT MEDS. SHE IS ENCOURAGED TO AVOID ADDING SALT TO HER FOODS. SHE WILL RTO IN SIX MONTHS FOR RE-EVALUATION.   - EKG 12-Lead  5. Vitamin D deficiency disease SHE IS ADVISED TO TAKE 1000 UNITS VIT D3 ONCE DAILY. SHE IS ALSO ENCOURAGED TO SPEND 15 MINUTES OUTSIDE DAILY.   6. Essential hypertension (CODE PUT IN BY CMA FOR URINE, CORRECT CODE IS I12.9) - POCT urinalysis dipstick               - POCT UA - Microalbumin   [name], MD

## 2018-04-10 LAB — CBC
Hematocrit: 34.9 % (ref 34.0–46.6)
Hemoglobin: 10.8 g/dL — ABNORMAL LOW (ref 11.1–15.9)
MCH: 23.3 pg — ABNORMAL LOW (ref 26.6–33.0)
MCHC: 30.9 g/dL — ABNORMAL LOW (ref 31.5–35.7)
MCV: 75 fL — ABNORMAL LOW (ref 79–97)
Platelets: 226 10*3/uL (ref 150–450)
RBC: 4.63 x10E6/uL (ref 3.77–5.28)
RDW: 17.8 % — ABNORMAL HIGH (ref 12.3–15.4)
WBC: 9.7 10*3/uL (ref 3.4–10.8)

## 2018-04-10 LAB — CMP14+EGFR
ALT: 25 IU/L (ref 0–32)
AST: 19 IU/L (ref 0–40)
Albumin/Globulin Ratio: 1.4 (ref 1.2–2.2)
Albumin: 3.9 g/dL (ref 3.5–4.8)
Alkaline Phosphatase: 64 IU/L (ref 39–117)
BUN/Creatinine Ratio: 19 (ref 12–28)
BUN: 51 mg/dL — ABNORMAL HIGH (ref 8–27)
Bilirubin Total: 0.4 mg/dL (ref 0.0–1.2)
CO2: 17 mmol/L — ABNORMAL LOW (ref 20–29)
Calcium: 9.3 mg/dL (ref 8.7–10.3)
Chloride: 109 mmol/L — ABNORMAL HIGH (ref 96–106)
Creatinine, Ser: 2.7 mg/dL — ABNORMAL HIGH (ref 0.57–1.00)
GFR calc Af Amer: 20 mL/min/{1.73_m2} — ABNORMAL LOW (ref >59)
GFR calc non Af Amer: 17 mL/min/{1.73_m2} — ABNORMAL LOW (ref >59)
Globulin, Total: 2.7 g/dL (ref 1.5–4.5)
Glucose: 241 mg/dL — ABNORMAL HIGH (ref 65–99)
Potassium: 4.5 mmol/L (ref 3.5–5.2)
Sodium: 143 mmol/L (ref 134–144)
Total Protein: 6.6 g/dL (ref 6.0–8.5)

## 2018-04-10 LAB — LIPID PANEL
Chol/HDL Ratio: 2.8 ratio (ref 0.0–4.4)
Cholesterol, Total: 138 mg/dL (ref 100–199)
HDL: 49 mg/dL (ref >39)
LDL Calculated: 69 mg/dL (ref 0–99)
Triglycerides: 101 mg/dL (ref 0–149)
VLDL Cholesterol Cal: 20 mg/dL (ref 5–40)

## 2018-04-11 NOTE — Progress Notes (Signed)
Your kidney function has slightly decreased. Your chloride level is slightly elevated, which implies that you may not be drinking enough water.  Your liver function is normal.  Your blood count is slightly low. Your cholesterol looks great.  Please let me know if you have any questions.

## 2018-04-12 ENCOUNTER — Encounter: Payer: Self-pay | Admitting: Internal Medicine

## 2018-04-12 NOTE — Progress Notes (Signed)
Your kidney function is still in stage 4 ckd range. It is important you stay hydrated and keep bp/bs controlled.  Your liver function is normal.  Your blood count is slightly lower than at your last visit. This is related to your CKD. Your cholesterol looks great. See you in six months!

## 2018-04-15 ENCOUNTER — Encounter: Payer: Self-pay | Admitting: Internal Medicine

## 2018-04-20 ENCOUNTER — Ambulatory Visit (HOSPITAL_COMMUNITY)
Admission: RE | Admit: 2018-04-20 | Discharge: 2018-04-20 | Disposition: A | Discharge status: Home / Self Care | Payer: Medicare Other | Source: Ambulatory Visit | Attending: Nephrology | Admitting: Nephrology

## 2018-04-20 VITALS — BP 140/69 | HR 81 | Temp 97.7°F | Resp 20

## 2018-04-20 DIAGNOSIS — D631 Anemia in chronic kidney disease: Secondary | ICD-10-CM | POA: Insufficient documentation

## 2018-04-20 DIAGNOSIS — N184 Chronic kidney disease, stage 4 (severe): Secondary | ICD-10-CM | POA: Diagnosis present

## 2018-04-20 DIAGNOSIS — E1122 Type 2 diabetes mellitus with diabetic chronic kidney disease: Secondary | ICD-10-CM

## 2018-04-20 LAB — POCT HEMOGLOBIN-HEMACUE: Hemoglobin: 11.1 g/dL — ABNORMAL LOW (ref 12.0–15.0)

## 2018-04-20 MED ORDER — EPOETIN ALFA 40000 UNIT/ML IJ SOLN: 30000.0000 [IU] | INTRAMUSCULAR | Status: DC

## 2018-04-20 MED ORDER — EPOETIN ALFA 10000 UNIT/ML IJ SOLN
INTRAMUSCULAR | Status: AC
  Administered 2018-04-20: 10000 [IU] via SUBCUTANEOUS
  Filled 2018-04-20: qty 1

## 2018-04-20 MED ORDER — EPOETIN ALFA 20000 UNIT/ML IJ SOLN
INTRAMUSCULAR | Status: AC
  Administered 2018-04-20: 20000 [IU] via SUBCUTANEOUS
  Filled 2018-04-20: qty 1

## 2018-04-22 ENCOUNTER — Other Ambulatory Visit: Payer: Self-pay

## 2018-04-22 MED ORDER — ONETOUCH DELICA LANCETS 33G MISC: 1.0000 | Freq: Every day | 11 refills | Status: DC

## 2018-05-04 ENCOUNTER — Ambulatory Visit (HOSPITAL_COMMUNITY)
Admission: RE | Admit: 2018-05-04 | Discharge: 2018-05-04 | Disposition: A | Discharge status: Home / Self Care | Payer: Medicare Other | Source: Ambulatory Visit | Attending: Nephrology | Admitting: Nephrology

## 2018-05-04 VITALS — BP 153/72 | HR 77 | Temp 97.9°F | Resp 20

## 2018-05-04 DIAGNOSIS — E1122 Type 2 diabetes mellitus with diabetic chronic kidney disease: Secondary | ICD-10-CM

## 2018-05-04 DIAGNOSIS — D631 Anemia in chronic kidney disease: Secondary | ICD-10-CM | POA: Diagnosis not present

## 2018-05-04 DIAGNOSIS — Z5181 Encounter for therapeutic drug level monitoring: Secondary | ICD-10-CM | POA: Insufficient documentation

## 2018-05-04 DIAGNOSIS — N184 Chronic kidney disease, stage 4 (severe): Secondary | ICD-10-CM | POA: Diagnosis not present

## 2018-05-04 DIAGNOSIS — Z79899 Other long term (current) drug therapy: Secondary | ICD-10-CM | POA: Diagnosis not present

## 2018-05-04 LAB — IRON AND TIBC
Iron: 85 ug/dL (ref 28–170)
Saturation Ratios: 34 % — ABNORMAL HIGH (ref 10.4–31.8)
TIBC: 249 ug/dL — ABNORMAL LOW (ref 250–450)
UIBC: 164 ug/dL

## 2018-05-04 LAB — POCT HEMOGLOBIN-HEMACUE: Hemoglobin: 11.7 g/dL — ABNORMAL LOW (ref 12.0–15.0)

## 2018-05-04 LAB — FERRITIN: Ferritin: 148 ng/mL (ref 11–307)

## 2018-05-04 MED ORDER — EPOETIN ALFA 10000 UNIT/ML IJ SOLN
INTRAMUSCULAR | Status: AC
  Administered 2018-05-04: 10000 [IU] via SUBCUTANEOUS
  Filled 2018-05-04: qty 1

## 2018-05-04 MED ORDER — EPOETIN ALFA 40000 UNIT/ML IJ SOLN: 30000.0000 [IU] | INTRAMUSCULAR | Status: DC

## 2018-05-04 MED ORDER — EPOETIN ALFA 20000 UNIT/ML IJ SOLN
INTRAMUSCULAR | Status: AC
  Administered 2018-05-04: 20000 [IU] via SUBCUTANEOUS
  Filled 2018-05-04: qty 1

## 2018-05-18 ENCOUNTER — Ambulatory Visit (HOSPITAL_COMMUNITY)
Admission: RE | Admit: 2018-05-18 | Discharge: 2018-05-18 | Disposition: A | Discharge status: Home / Self Care | Payer: Medicare Other | Source: Ambulatory Visit | Attending: Nephrology | Admitting: Nephrology

## 2018-05-18 VITALS — BP 149/74 | HR 72 | Temp 98.2°F | Resp 20

## 2018-05-18 DIAGNOSIS — E1122 Type 2 diabetes mellitus with diabetic chronic kidney disease: Secondary | ICD-10-CM | POA: Diagnosis present

## 2018-05-18 LAB — POCT HEMOGLOBIN-HEMACUE: Hemoglobin: 10.8 g/dL — ABNORMAL LOW (ref 12.0–15.0)

## 2018-05-18 MED ORDER — EPOETIN ALFA 40000 UNIT/ML IJ SOLN: 30000.0000 [IU] | INTRAMUSCULAR | Status: DC

## 2018-05-18 MED ORDER — EPOETIN ALFA 20000 UNIT/ML IJ SOLN
INTRAMUSCULAR | Status: AC
  Administered 2018-05-18: 20000 [IU] via SUBCUTANEOUS
  Filled 2018-05-18: qty 1

## 2018-05-18 MED ORDER — EPOETIN ALFA 10000 UNIT/ML IJ SOLN
INTRAMUSCULAR | Status: AC
  Administered 2018-05-18: 10000 [IU] via SUBCUTANEOUS
  Filled 2018-05-18: qty 1

## 2018-05-19 ENCOUNTER — Other Ambulatory Visit: Payer: Self-pay

## 2018-05-19 MED ORDER — QUINAPRIL-HYDROCHLOROTHIAZIDE 20-12.5 MG PO TABS: ORAL_TABLET | ORAL | 1 refills | Status: DC

## 2018-05-19 MED ORDER — INSULIN DETEMIR 100 UNIT/ML FLEXPEN: 16.0000 [IU] | PEN_INJECTOR | Freq: Every day | SUBCUTANEOUS | 1 refills | Status: DC

## 2018-06-01 ENCOUNTER — Ambulatory Visit (HOSPITAL_COMMUNITY)
Admission: RE | Admit: 2018-06-01 | Discharge: 2018-06-01 | Disposition: A | Discharge status: Home / Self Care | Payer: Medicare Other | Source: Ambulatory Visit | Attending: Nephrology | Admitting: Nephrology

## 2018-06-01 VITALS — BP 139/66 | HR 86 | Temp 97.7°F | Resp 20

## 2018-06-01 DIAGNOSIS — E1122 Type 2 diabetes mellitus with diabetic chronic kidney disease: Secondary | ICD-10-CM | POA: Diagnosis not present

## 2018-06-01 LAB — POCT HEMOGLOBIN-HEMACUE: Hemoglobin: 11.1 g/dL — ABNORMAL LOW (ref 12.0–15.0)

## 2018-06-01 MED ORDER — EPOETIN ALFA 10000 UNIT/ML IJ SOLN
INTRAMUSCULAR | Status: AC
  Administered 2018-06-01: 10000 [IU] via SUBCUTANEOUS
  Filled 2018-06-01: qty 1

## 2018-06-01 MED ORDER — EPOETIN ALFA 40000 UNIT/ML IJ SOLN: 30000.0000 [IU] | INTRAMUSCULAR | Status: DC

## 2018-06-01 MED ORDER — EPOETIN ALFA 20000 UNIT/ML IJ SOLN
INTRAMUSCULAR | Status: AC
  Administered 2018-06-01: 20000 [IU] via SUBCUTANEOUS
  Filled 2018-06-01: qty 1

## 2018-06-15 ENCOUNTER — Ambulatory Visit (HOSPITAL_COMMUNITY)
Admission: RE | Admit: 2018-06-15 | Discharge: 2018-06-15 | Disposition: A | Discharge status: Home / Self Care | Payer: Medicare Other | Source: Ambulatory Visit | Attending: Nephrology | Admitting: Nephrology

## 2018-06-15 VITALS — BP 133/71 | HR 101 | Temp 97.7°F | Resp 20

## 2018-06-15 DIAGNOSIS — D631 Anemia in chronic kidney disease: Secondary | ICD-10-CM | POA: Insufficient documentation

## 2018-06-15 DIAGNOSIS — E1122 Type 2 diabetes mellitus with diabetic chronic kidney disease: Secondary | ICD-10-CM

## 2018-06-15 DIAGNOSIS — N184 Chronic kidney disease, stage 4 (severe): Secondary | ICD-10-CM | POA: Diagnosis present

## 2018-06-15 LAB — IRON AND TIBC
Iron: 90 ug/dL (ref 28–170)
Saturation Ratios: 35 % — ABNORMAL HIGH (ref 10.4–31.8)
TIBC: 259 ug/dL (ref 250–450)
UIBC: 169 ug/dL

## 2018-06-15 LAB — FERRITIN: Ferritin: 109 ng/mL (ref 11–307)

## 2018-06-15 LAB — POCT HEMOGLOBIN-HEMACUE: Hemoglobin: 11.5 g/dL — ABNORMAL LOW (ref 12.0–15.0)

## 2018-06-15 MED ORDER — EPOETIN ALFA 40000 UNIT/ML IJ SOLN: 30000.0000 [IU] | INTRAMUSCULAR | Status: DC

## 2018-06-15 MED ORDER — EPOETIN ALFA 20000 UNIT/ML IJ SOLN
INTRAMUSCULAR | Status: AC
  Administered 2018-06-15: 20000 [IU] via SUBCUTANEOUS
  Filled 2018-06-15: qty 1

## 2018-06-15 MED ORDER — EPOETIN ALFA 10000 UNIT/ML IJ SOLN
INTRAMUSCULAR | Status: AC
  Administered 2018-06-15: 10000 [IU] via SUBCUTANEOUS
  Filled 2018-06-15: qty 1

## 2018-06-29 ENCOUNTER — Encounter (HOSPITAL_COMMUNITY): Payer: Medicare Other

## 2018-07-06 ENCOUNTER — Ambulatory Visit (HOSPITAL_COMMUNITY)
Admission: RE | Admit: 2018-07-06 | Discharge: 2018-07-06 | Disposition: A | Discharge status: Home / Self Care | Payer: Medicare Other | Source: Ambulatory Visit | Attending: Nephrology | Admitting: Nephrology

## 2018-07-06 VITALS — BP 121/88 | HR 91 | Temp 98.5°F | Resp 20

## 2018-07-06 DIAGNOSIS — E1122 Type 2 diabetes mellitus with diabetic chronic kidney disease: Secondary | ICD-10-CM | POA: Diagnosis not present

## 2018-07-06 LAB — POCT HEMOGLOBIN-HEMACUE: Hemoglobin: 10.8 g/dL — ABNORMAL LOW (ref 12.0–15.0)

## 2018-07-06 MED ORDER — EPOETIN ALFA 10000 UNIT/ML IJ SOLN
INTRAMUSCULAR | Status: AC
  Administered 2018-07-06: 10000 [IU] via SUBCUTANEOUS
  Filled 2018-07-06: qty 1

## 2018-07-06 MED ORDER — EPOETIN ALFA 40000 UNIT/ML IJ SOLN: 30000.0000 [IU] | INTRAMUSCULAR | Status: DC

## 2018-07-06 MED ORDER — EPOETIN ALFA 20000 UNIT/ML IJ SOLN
INTRAMUSCULAR | Status: AC
  Administered 2018-07-06: 20000 [IU] via SUBCUTANEOUS
  Filled 2018-07-06: qty 1

## 2018-07-13 ENCOUNTER — Encounter (HOSPITAL_COMMUNITY): Payer: Medicare Other

## 2018-07-20 ENCOUNTER — Ambulatory Visit (HOSPITAL_COMMUNITY)
Admission: RE | Admit: 2018-07-20 | Discharge: 2018-07-20 | Disposition: A | Discharge status: Home / Self Care | Payer: Medicare Other | Source: Ambulatory Visit | Attending: Nephrology | Admitting: Nephrology

## 2018-07-20 VITALS — BP 142/74 | HR 79 | Temp 97.6°F | Resp 20

## 2018-07-20 DIAGNOSIS — E1122 Type 2 diabetes mellitus with diabetic chronic kidney disease: Secondary | ICD-10-CM | POA: Insufficient documentation

## 2018-07-20 LAB — POCT HEMOGLOBIN-HEMACUE: Hemoglobin: 10.4 g/dL — ABNORMAL LOW (ref 12.0–15.0)

## 2018-07-20 MED ORDER — EPOETIN ALFA 40000 UNIT/ML IJ SOLN
30000.0000 [IU] | INTRAMUSCULAR | Status: DC
  Administered 2018-07-20: 30000 [IU] via SUBCUTANEOUS

## 2018-07-20 MED ORDER — EPOETIN ALFA 10000 UNIT/ML IJ SOLN
INTRAMUSCULAR | Status: AC
  Filled 2018-07-20: qty 1

## 2018-07-20 MED ORDER — EPOETIN ALFA 20000 UNIT/ML IJ SOLN
INTRAMUSCULAR | Status: AC
  Filled 2018-07-20: qty 1

## 2018-07-22 MED FILL — Epoetin Alfa Inj 20000 Unit/ML: INTRAMUSCULAR | Qty: 1 | Status: AC

## 2018-07-22 MED FILL — Epoetin Alfa-epbx Inj 10000 Unit/ML: INTRAMUSCULAR | Qty: 1 | Status: AC

## 2018-07-26 MED FILL — Epoetin Alfa Inj 10000 Unit/ML: INTRAMUSCULAR | Qty: 1 | Status: AC

## 2018-08-03 ENCOUNTER — Ambulatory Visit (HOSPITAL_COMMUNITY)
Admission: RE | Admit: 2018-08-03 | Discharge: 2018-08-03 | Disposition: A | Discharge status: Home / Self Care | Payer: Medicare Other | Source: Ambulatory Visit | Attending: Nephrology | Admitting: Nephrology

## 2018-08-03 VITALS — BP 141/82 | HR 88 | Temp 98.4°F | Resp 20

## 2018-08-03 DIAGNOSIS — E1122 Type 2 diabetes mellitus with diabetic chronic kidney disease: Secondary | ICD-10-CM | POA: Insufficient documentation

## 2018-08-03 LAB — POCT HEMOGLOBIN-HEMACUE: Hemoglobin: 11.2 g/dL — ABNORMAL LOW (ref 12.0–15.0)

## 2018-08-03 LAB — FERRITIN: Ferritin: 135 ng/mL (ref 11–307)

## 2018-08-03 LAB — IRON AND TIBC
Iron: 97 ug/dL (ref 28–170)
Saturation Ratios: 36 % — ABNORMAL HIGH (ref 10.4–31.8)
TIBC: 266 ug/dL (ref 250–450)
UIBC: 169 ug/dL

## 2018-08-03 MED ORDER — EPOETIN ALFA 10000 UNIT/ML IJ SOLN
INTRAMUSCULAR | Status: AC
  Administered 2018-08-03: 10000 [IU] via SUBCUTANEOUS
  Filled 2018-08-03: qty 1

## 2018-08-03 MED ORDER — EPOETIN ALFA 40000 UNIT/ML IJ SOLN: 30000.0000 [IU] | INTRAMUSCULAR | Status: DC

## 2018-08-03 MED ORDER — EPOETIN ALFA 20000 UNIT/ML IJ SOLN
INTRAMUSCULAR | Status: AC
  Administered 2018-08-03: 20000 [IU] via SUBCUTANEOUS
  Filled 2018-08-03: qty 1

## 2018-08-04 ENCOUNTER — Ambulatory Visit (INDEPENDENT_AMBULATORY_CARE_PROVIDER_SITE_OTHER): Payer: Medicare Other | Admitting: Internal Medicine

## 2018-08-04 ENCOUNTER — Encounter: Payer: Self-pay | Admitting: Internal Medicine

## 2018-08-04 VITALS — BP 110/68 | HR 85 | Temp 97.4°F | Ht 64.5 in | Wt 182.4 lb

## 2018-08-04 DIAGNOSIS — R1012 Left upper quadrant pain: Secondary | ICD-10-CM | POA: Diagnosis not present

## 2018-08-04 DIAGNOSIS — R141 Gas pain: Secondary | ICD-10-CM

## 2018-08-04 DIAGNOSIS — Z6830 Body mass index (BMI) 30.0-30.9, adult: Secondary | ICD-10-CM

## 2018-08-04 DIAGNOSIS — E6609 Other obesity due to excess calories: Secondary | ICD-10-CM

## 2018-08-04 DIAGNOSIS — R143 Flatulence: Secondary | ICD-10-CM | POA: Diagnosis not present

## 2018-08-04 DIAGNOSIS — R142 Eructation: Secondary | ICD-10-CM

## 2018-08-04 MED ORDER — DEXLANSOPRAZOLE 60 MG PO CPDR: 60.0000 mg | DELAYED_RELEASE_CAPSULE | Freq: Every day | ORAL | 0 refills | Status: DC

## 2018-08-04 NOTE — Patient Instructions (Signed)
Food Choices for Gastroesophageal Reflux Disease, Adult When you have gastroesophageal reflux disease (GERD), the foods you eat and your eating habits are very important. Choosing the right foods can help ease your discomfort. Think about working with a nutrition specialist (dietitian) to help you make good choices. What are tips for following this plan?  Meals  Choose healthy foods that are low in fat, such as fruits, vegetables, whole grains, low-fat dairy products, and lean meat, fish, and poultry.  Eat small meals often instead of 3 large meals a day. Eat your meals slowly, and in a place where you are relaxed. Avoid bending over or lying down until 2-3 hours after eating.  Avoid eating meals 2-3 hours before bed.  Avoid drinking a lot of liquid with meals.  Cook foods using methods other than frying. Bake, grill, or broil food instead.  Avoid or limit: ? Chocolate. ? Peppermint or spearmint. ? Alcohol. ? Pepper. ? Black and decaffeinated coffee. ? Black and decaffeinated tea. ? Bubbly (carbonated) soft drinks. ? Caffeinated energy drinks and soft drinks.  Limit high-fat foods such as: ? Fatty meat or fried foods. ? Whole milk, cream, butter, or ice cream. ? Nuts and nut butters. ? Pastries, donuts, and sweets made with butter or shortening.  Avoid foods that cause symptoms. These foods may be different for everyone. Common foods that cause symptoms include: ? Tomatoes. ? Oranges, lemons, and limes. ? Peppers. ? Spicy food. ? Onions and garlic. ? Vinegar. Lifestyle  Maintain a healthy weight. Ask your doctor what weight is healthy for you. If you need to lose weight, work with your doctor to do so safely.  Exercise for at least 30 minutes for 5 or more days each week, or as told by your doctor.  Wear loose-fitting clothes.  Do not smoke. If you need help quitting, ask your doctor.  Sleep with the head of your bed higher than your feet. Use a wedge under the  mattress or blocks under the bed frame to raise the head of the bed. Summary  When you have gastroesophageal reflux disease (GERD), food and lifestyle choices are very important in easing your symptoms.  Eat small meals often instead of 3 large meals a day. Eat your meals slowly, and in a place where you are relaxed.  Limit high-fat foods such as fatty meat or fried foods.  Avoid bending over or lying down until 2-3 hours after eating.  Avoid peppermint and spearmint, caffeine, alcohol, and chocolate. This information is not intended to replace advice given to you by your health care provider. Make sure you discuss any questions you have with your health care provider. Document Released: 12/23/2011 Document Revised: 07/29/2016 Document Reviewed: 07/29/2016 Elsevier Interactive Patient Education  2019 [employer].

## 2018-08-05 ENCOUNTER — Telehealth: Payer: Self-pay

## 2018-08-05 LAB — CMP14+EGFR
ALT: 35 IU/L — ABNORMAL HIGH (ref 0–32)
AST: 19 IU/L (ref 0–40)
Albumin/Globulin Ratio: 1.6 (ref 1.2–2.2)
Albumin: 4.1 g/dL (ref 3.7–4.7)
Alkaline Phosphatase: 90 IU/L (ref 39–117)
BUN/Creatinine Ratio: 16 (ref 12–28)
BUN: 48 mg/dL — ABNORMAL HIGH (ref 8–27)
Bilirubin Total: 0.4 mg/dL (ref 0.0–1.2)
CO2: 18 mmol/L — ABNORMAL LOW (ref 20–29)
Calcium: 9.1 mg/dL (ref 8.7–10.3)
Chloride: 104 mmol/L (ref 96–106)
Creatinine, Ser: 3.03 mg/dL — ABNORMAL HIGH (ref 0.57–1.00)
GFR calc Af Amer: 17 mL/min/{1.73_m2} — ABNORMAL LOW (ref >59)
GFR calc non Af Amer: 15 mL/min/{1.73_m2} — ABNORMAL LOW (ref >59)
Globulin, Total: 2.5 g/dL (ref 1.5–4.5)
Glucose: 275 mg/dL — ABNORMAL HIGH (ref 65–99)
Potassium: 4.6 mmol/L (ref 3.5–5.2)
Sodium: 136 mmol/L (ref 134–144)
Total Protein: 6.6 g/dL (ref 6.0–8.5)

## 2018-08-05 LAB — AMYLASE: Amylase: 119 U/L — ABNORMAL HIGH (ref 31–110)

## 2018-08-05 LAB — LIPASE: Lipase: 66 U/L (ref 14–85)

## 2018-08-05 NOTE — Telephone Encounter (Signed)
-----  Message from Glendale Chard, MD sent at 08/05/2018  2:55 PM EST ----- Your kidney fxn has decreased. Your glucose is quite elevated. Pls send her labs to her nephrologist.  Your liver enzymes are elevated, please move more and cut out sugar. Your amylase is elevated. You will get a call regarding ultrasound by tomorrow. How are you feeling today? Did dexilant help at all?

## 2018-08-05 NOTE — Telephone Encounter (Signed)
Left the pt a message to call the office back for lab results.

## 2018-08-11 ENCOUNTER — Other Ambulatory Visit: Payer: Self-pay | Admitting: Internal Medicine

## 2018-08-11 DIAGNOSIS — R109 Unspecified abdominal pain: Secondary | ICD-10-CM

## 2018-08-15 NOTE — Progress Notes (Signed)
Subjective:     Patient ID: Miranda Roman , female    DOB: 23-Jan-1946 , 73 y.o.   MRN: [id number]   Chief Complaint  Patient presents with  . Abdominal Pain    HPI  She is here today for further evaluation of abdominal pain. She reports that it awakens her at night. She states it feels like hunger pains. It is sometimes sharp in nature. The discomfort goes away with food intake.  The symptoms started about four months ago.     Abdominal Pain  The current episode started more than 1 month ago. The onset quality is gradual. The problem occurs intermittently. The problem has been unchanged. The pain is located in the LUQ. The pain is at a severity of 7/10. The pain is moderate. The quality of the pain is dull, sharp and aching.     Past Medical History:  Diagnosis Date  . Anemia   . Diabetes mellitus   . Hyperlipidemia   . Hypertension   . Osteoporosis   . Sickle cell anemia (HCC)      Family History  Problem Relation Age of Onset  . Diabetes Mother   . Heart disease Mother   . Diabetes Father   . Heart disease Father   . Diabetes Sister   . Diabetes Maternal Aunt   . Diabetes Maternal Uncle   . Diabetes Maternal Grandmother   . Diabetes Maternal Grandfather      Current Outpatient Medications:  .  aspirin 81 MG tablet, Take 81 mg by mouth daily., Disp: , Rfl:  .  atorvastatin (LIPITOR) 40 MG tablet, Take 40 mg by mouth daily., Disp: , Rfl:  .  Biotin (BIOTIN 5000) 5 MG CAPS, Take by mouth daily., Disp: , Rfl:  .  calcitRIOL (ROCALTROL) 0.5 MCG capsule, Take 0.5 mcg by mouth daily. 2 tabs 1 day and 3 tabs the next day alternating 2 tabs and 3 tabs, Disp: , Rfl:  .  epoetin alfa (EPOGEN,PROCRIT) 82707 UNIT/ML injection, 10,000 Units 3 (three) times a week. Dose unknown, Disp: , Rfl:  .  Glucosamine-Chondroitin (GLUCOSAMINE CHONDR COMPLEX PO), Take 1 tablet by mouth daily. , Disp: , Rfl:  .  glucose blood (ONETOUCH VERIO) test strip, 1 each by Other route daily. Use  as instructed, Disp: , Rfl:  .  Insulin Detemir (LEVEMIR FLEXTOUCH) 100 UNIT/ML Pen, Inject 16 Units into the skin daily. (Patient taking differently: Inject 18 Units into the skin daily. ), Disp: 15 pen, Rfl: 1 .  insulin lispro (HUMALOG) 100 UNIT/ML injection, Inject 4 Units into the skin 1 day or 1 dose., Disp: , Rfl:  .  ONETOUCH DELICA LANCETS 86L MISC, 1 each by Does not apply route daily. DX: E11.65, Disp: 150 each, Rfl: 11 .  Probiotic Product (PROBIOTIC ADVANCED PO), Take by mouth., Disp: , Rfl:  .  quinapril-hydrochlorothiazide (ACCURETIC) 20-12.5 MG tablet, Take 1/2 tablet by mouth daily, Disp: 90 tablet, Rfl: 1 .  dexlansoprazole (DEXILANT) 60 MG capsule, Take 1 capsule (60 mg total) by mouth daily., Disp: 30 capsule, Rfl: 0   No Known Allergies   Review of Systems  Constitutional: Negative.   Respiratory: Negative.   Cardiovascular: Negative.   Gastrointestinal: Positive for abdominal distention and abdominal pain.  Neurological: Negative.   Psychiatric/Behavioral: Negative.      Today's Vitals   08/04/18 1522  BP: 110/68  Pulse: 85  Temp: (!) 97.4 F (36.3 C)  TempSrc: Oral  Weight: 182 lb 6.4 oz (82.7  kg)  Height: 5' 4.5" (1.638 m)  PainSc: 0-No pain   Body mass index is 30.83 kg/m.   Objective:  Physical Exam Vitals signs and nursing note reviewed.  Constitutional:      Appearance: She is well-developed. She is obese.  HENT:     Head: Normocephalic and atraumatic.  Cardiovascular:     Rate and Rhythm: Normal rate and regular rhythm.     Heart sounds: Normal heart sounds.  Pulmonary:     Effort: Pulmonary effort is normal.     Breath sounds: Normal breath sounds.  Abdominal:     General: Bowel sounds are normal.     Palpations: Abdomen is soft.     Tenderness: There is abdominal tenderness in the left upper quadrant.  Skin:    General: Skin is warm.  Neurological:     General: No focal deficit present.     Mental Status: She is alert.   Psychiatric:        Mood and Affect: Mood normal.         Assessment And Plan:     1. LUQ pain  I will schedule her for abdominal ultrasound. I will check labs as listed below. I will make further recommendations once her labs are available for review.   - CMP14+EGFR - Amylase - Lipase - FAST [place]; Future  2. Flatulence, eructation and gas pain  She was given sample of Dexilant 55m to take once daily for next two weeks. I expect her to have some improvement in her symptoms with this regimen.   3. Class 1 obesity due to excess calories with serious comorbidity and body mass index (BMI) of 30.0 to 30.9 in adult  She is encouraged to initially strive for BMI less than 28 to decrease cardiac risk. She is advised to exercise no less than 150 minutes per week.    RMaximino Greenland MD

## 2018-08-17 ENCOUNTER — Ambulatory Visit (HOSPITAL_COMMUNITY)
Admission: RE | Admit: 2018-08-17 | Discharge: 2018-08-17 | Disposition: A | Discharge status: Home / Self Care | Payer: Medicare Other | Source: Ambulatory Visit | Attending: Nephrology | Admitting: Nephrology

## 2018-08-17 VITALS — BP 134/68 | HR 86 | Temp 98.7°F | Resp 20

## 2018-08-17 DIAGNOSIS — E1122 Type 2 diabetes mellitus with diabetic chronic kidney disease: Secondary | ICD-10-CM | POA: Diagnosis not present

## 2018-08-17 LAB — POCT HEMOGLOBIN-HEMACUE: Hemoglobin: 11 g/dL — ABNORMAL LOW (ref 12.0–15.0)

## 2018-08-17 MED ORDER — EPOETIN ALFA 10000 UNIT/ML IJ SOLN
INTRAMUSCULAR | Status: AC
  Administered 2018-08-17: 10000 [IU] via SUBCUTANEOUS
  Filled 2018-08-17: qty 1

## 2018-08-17 MED ORDER — EPOETIN ALFA 20000 UNIT/ML IJ SOLN
INTRAMUSCULAR | Status: AC
  Administered 2018-08-17: 20000 [IU] via SUBCUTANEOUS
  Filled 2018-08-17: qty 1

## 2018-08-17 MED ORDER — EPOETIN ALFA 10000 UNIT/ML IJ SOLN: 30000.0000 [IU] | INTRAMUSCULAR | Status: DC

## 2018-08-20 ENCOUNTER — Other Ambulatory Visit: Payer: Medicare Other

## 2018-08-24 ENCOUNTER — Ambulatory Visit (INDEPENDENT_AMBULATORY_CARE_PROVIDER_SITE_OTHER): Payer: Medicare Other | Admitting: Internal Medicine

## 2018-08-24 ENCOUNTER — Encounter: Payer: Self-pay | Admitting: Internal Medicine

## 2018-08-24 VITALS — BP 142/70 | HR 86 | Temp 98.0°F | Ht 64.2 in | Wt 180.6 lb

## 2018-08-24 DIAGNOSIS — R0689 Other abnormalities of breathing: Secondary | ICD-10-CM

## 2018-08-24 DIAGNOSIS — Z87891 Personal history of nicotine dependence: Secondary | ICD-10-CM | POA: Diagnosis not present

## 2018-08-24 DIAGNOSIS — J40 Bronchitis, not specified as acute or chronic: Secondary | ICD-10-CM | POA: Diagnosis not present

## 2018-08-24 DIAGNOSIS — Z720 Tobacco use: Secondary | ICD-10-CM

## 2018-08-24 DIAGNOSIS — F17211 Nicotine dependence, cigarettes, in remission: Secondary | ICD-10-CM

## 2018-08-24 MED ORDER — BENZONATATE 100 MG PO CAPS: 100.0000 mg | ORAL_CAPSULE | Freq: Four times a day (QID) | ORAL | 1 refills | Status: DC | PRN

## 2018-08-24 MED ORDER — ALBUTEROL SULFATE (2.5 MG/3ML) 0.083% IN NEBU
2.5000 mg | INHALATION_SOLUTION | Freq: Once | RESPIRATORY_TRACT | Status: AC
  Administered 2018-08-24: 2.5 mg via RESPIRATORY_TRACT

## 2018-08-25 ENCOUNTER — Other Ambulatory Visit: Payer: Medicare Other

## 2018-08-25 ENCOUNTER — Other Ambulatory Visit: Payer: Self-pay

## 2018-08-25 MED ORDER — BENZONATATE 100 MG PO CAPS: 100.0000 mg | ORAL_CAPSULE | Freq: Four times a day (QID) | ORAL | 1 refills | Status: DC | PRN

## 2018-08-26 ENCOUNTER — Other Ambulatory Visit: Payer: Self-pay | Admitting: Internal Medicine

## 2018-08-26 ENCOUNTER — Ambulatory Visit
Admission: RE | Admit: 2018-08-26 | Discharge: 2018-08-26 | Disposition: A | Discharge status: Home / Self Care | Payer: Medicare Other | Source: Ambulatory Visit | Attending: Internal Medicine | Admitting: Internal Medicine

## 2018-08-26 ENCOUNTER — Other Ambulatory Visit: Payer: Medicare Other

## 2018-08-26 DIAGNOSIS — R1012 Left upper quadrant pain: Secondary | ICD-10-CM

## 2018-08-26 DIAGNOSIS — R109 Unspecified abdominal pain: Secondary | ICD-10-CM

## 2018-08-26 DIAGNOSIS — K8689 Other specified diseases of pancreas: Secondary | ICD-10-CM | POA: Diagnosis not present

## 2018-08-26 NOTE — Progress Notes (Signed)
U/s results: abnormal area of pancreas - may represent cyst or mass. Suggest f/u with contrast enhanced MRI of the abdomen. Also with mass on liver. Will also evaluate this with MRI. I have placed order for this and someone will call you once scheduled.

## 2018-08-27 ENCOUNTER — Ambulatory Visit
Admission: RE | Admit: 2018-08-27 | Discharge: 2018-08-27 | Disposition: A | Discharge status: Home / Self Care | Payer: Medicare Other | Source: Ambulatory Visit | Attending: Internal Medicine | Admitting: Internal Medicine

## 2018-08-27 ENCOUNTER — Other Ambulatory Visit: Payer: Self-pay | Admitting: Internal Medicine

## 2018-08-27 DIAGNOSIS — K862 Cyst of pancreas: Secondary | ICD-10-CM | POA: Diagnosis not present

## 2018-08-27 DIAGNOSIS — R1012 Left upper quadrant pain: Secondary | ICD-10-CM

## 2018-08-27 DIAGNOSIS — N289 Disorder of kidney and ureter, unspecified: Secondary | ICD-10-CM | POA: Diagnosis not present

## 2018-08-29 ENCOUNTER — Encounter: Payer: Self-pay | Admitting: Internal Medicine

## 2018-08-29 NOTE — Progress Notes (Signed)
Subjective:     Patient ID: Miranda Roman , female    DOB: October 03, 1945 , 73 y.o.   MRN: [id number]   Chief Complaint  Patient presents with  . CHEST CONGESTION    HPI  She is here today for further evaluation of chest congestion and cough. Her symptoms started about a week ago. There has been no relief with OTC meds. No fever/chills. She denies ill contacts. Her cough is non-productive. She does not have h/o asthma or COPD. She has remote h/o tobacco use.   Cough      Past Medical History:  Diagnosis Date  . Anemia   . Diabetes mellitus   . Hyperlipidemia   . Hypertension   . Osteoporosis   . Sickle cell anemia (HCC)      Family History  Problem Relation Age of Onset  . Diabetes Mother   . Heart disease Mother   . Diabetes Father   . Heart disease Father   . Diabetes Sister   . Diabetes Maternal Aunt   . Diabetes Maternal Uncle   . Diabetes Maternal Grandmother   . Diabetes Maternal Grandfather      Current Outpatient Medications:  .  aspirin 81 MG tablet, Take 81 mg by mouth daily., Disp: , Rfl:  .  atorvastatin (LIPITOR) 40 MG tablet, Take 40 mg by mouth daily., Disp: , Rfl:  .  calcitRIOL (ROCALTROL) 0.5 MCG capsule, Take 0.5 mcg by mouth daily. 2 tabs 1 day and 3 tabs the next day alternating 2 tabs and 3 tabs, Disp: , Rfl:  .  epoetin alfa (EPOGEN,PROCRIT) 12458 UNIT/ML injection, 10,000 Units 3 (three) times a week. Dose unknown, Disp: , Rfl:  .  Glucosamine-Chondroitin (GLUCOSAMINE CHONDR COMPLEX PO), Take 1 tablet by mouth daily. , Disp: , Rfl:  .  glucose blood (ONETOUCH VERIO) test strip, 1 each by Other route daily. Use as instructed, Disp: , Rfl:  .  Insulin Detemir (LEVEMIR FLEXTOUCH) 100 UNIT/ML Pen, Inject 16 Units into the skin daily. (Patient taking differently: Inject 18 Units into the skin daily. ), Disp: 15 pen, Rfl: 1 .  insulin lispro (HUMALOG) 100 UNIT/ML injection, Inject 4 Units into the skin 1 day or 1 dose., Disp: , Rfl:  .  ONETOUCH  DELICA LANCETS 09X MISC, 1 each by Does not apply route daily. DX: E11.65, Disp: 150 each, Rfl: 11 .  Probiotic Product (PROBIOTIC ADVANCED PO), Take by mouth., Disp: , Rfl:  .  quinapril-hydrochlorothiazide (ACCURETIC) 20-12.5 MG tablet, Take 1/2 tablet by mouth daily, Disp: 90 tablet, Rfl: 1 .  benzonatate (TESSALON PERLES) 100 MG capsule, Take 1 capsule (100 mg total) by mouth every 6 (six) hours as needed for cough., Disp: 30 capsule, Rfl: 1 .  Biotin (BIOTIN 5000) 5 MG CAPS, Take by mouth daily., Disp: , Rfl:  .  dexlansoprazole (DEXILANT) 60 MG capsule, Take 1 capsule (60 mg total) by mouth daily. (Patient not taking: Reported on 08/24/2018), Disp: 30 capsule, Rfl: 0   No Known Allergies   Review of Systems  Constitutional: Negative.   Respiratory: Positive for cough.   Cardiovascular: Negative.   Gastrointestinal: Negative.   Neurological: Negative.   Psychiatric/Behavioral: Negative.      Today's Vitals   08/24/18 1449  BP: (!) 142/70  Pulse: 86  Temp: 98 F (36.7 C)  TempSrc: Oral  SpO2: 98%  Weight: 180 lb 9.6 oz (81.9 kg)  Height: 5' 4.2" (1.631 m)  PainSc: 0-No pain   Body mass  index is 30.81 kg/m.   Objective:  Physical Exam Vitals signs and nursing note reviewed.  Constitutional:      Appearance: Normal appearance.  HENT:     Head: Normocephalic and atraumatic.     Right Ear: Tympanic membrane, ear canal and external ear normal.     Left Ear: Tympanic membrane, ear canal and external ear normal.     Mouth/Throat:     Pharynx: Posterior oropharyngeal erythema present.  Cardiovascular:     Rate and Rhythm: Normal rate and regular rhythm.     Heart sounds: Normal heart sounds.  Pulmonary:     Breath sounds: Rhonchi present.  Neurological:     General: No focal deficit present.     Mental Status: She is alert.         Assessment And Plan:     1. Bronchitis  She was given albuterol nebulizer treatment with improvement in her breath sounds. She was  also given rx tessalon perles to use prn.   - albuterol (PROVENTIL) (2.5 MG/3ML) 0.083% nebulizer solution 2.5 mg   2. Cigarette nicotine dependence in remission   I will refer her for low dose CT chest.   [name], MD

## 2018-08-31 ENCOUNTER — Encounter (HOSPITAL_COMMUNITY)
Admission: RE | Admit: 2018-08-31 | Discharge: 2018-08-31 | Disposition: A | Discharge status: Home / Self Care | Payer: Medicare Other | Source: Ambulatory Visit | Attending: Nephrology | Admitting: Nephrology

## 2018-08-31 VITALS — BP 122/85 | HR 98 | Temp 98.0°F | Resp 18

## 2018-08-31 DIAGNOSIS — E1122 Type 2 diabetes mellitus with diabetic chronic kidney disease: Secondary | ICD-10-CM | POA: Diagnosis not present

## 2018-08-31 LAB — POCT HEMOGLOBIN-HEMACUE: Hemoglobin: 10.8 g/dL — ABNORMAL LOW (ref 12.0–15.0)

## 2018-08-31 MED ORDER — EPOETIN ALFA 20000 UNIT/ML IJ SOLN
INTRAMUSCULAR | Status: AC
  Administered 2018-08-31: 20000 [IU] via SUBCUTANEOUS
  Filled 2018-08-31: qty 1

## 2018-08-31 MED ORDER — EPOETIN ALFA 10000 UNIT/ML IJ SOLN
INTRAMUSCULAR | Status: AC
  Administered 2018-08-31: 10000 [IU] via SUBCUTANEOUS
  Filled 2018-08-31: qty 1

## 2018-08-31 MED ORDER — EPOETIN ALFA 40000 UNIT/ML IJ SOLN: 30000.0000 [IU] | INTRAMUSCULAR | Status: DC

## 2018-09-07 DIAGNOSIS — E1122 Type 2 diabetes mellitus with diabetic chronic kidney disease: Secondary | ICD-10-CM | POA: Diagnosis not present

## 2018-09-07 DIAGNOSIS — N184 Chronic kidney disease, stage 4 (severe): Secondary | ICD-10-CM | POA: Diagnosis not present

## 2018-09-07 DIAGNOSIS — D631 Anemia in chronic kidney disease: Secondary | ICD-10-CM | POA: Diagnosis not present

## 2018-09-07 DIAGNOSIS — I129 Hypertensive chronic kidney disease with stage 1 through stage 4 chronic kidney disease, or unspecified chronic kidney disease: Secondary | ICD-10-CM | POA: Diagnosis not present

## 2018-09-07 DIAGNOSIS — N2581 Secondary hyperparathyroidism of renal origin: Secondary | ICD-10-CM | POA: Diagnosis not present

## 2018-09-14 ENCOUNTER — Encounter (HOSPITAL_COMMUNITY): Payer: Medicare Other

## 2018-09-14 ENCOUNTER — Encounter (HOSPITAL_COMMUNITY)
Admission: RE | Admit: 2018-09-14 | Discharge: 2018-09-14 | Disposition: A | Discharge status: Home / Self Care | Payer: Medicare Other | Source: Ambulatory Visit | Attending: Nephrology | Admitting: Nephrology

## 2018-09-14 VITALS — BP 129/71 | HR 75 | Temp 98.6°F | Resp 20

## 2018-09-14 DIAGNOSIS — E1122 Type 2 diabetes mellitus with diabetic chronic kidney disease: Secondary | ICD-10-CM | POA: Diagnosis not present

## 2018-09-14 LAB — IRON AND TIBC
Iron: 76 ug/dL (ref 28–170)
Saturation Ratios: 30 % (ref 10.4–31.8)
TIBC: 258 ug/dL (ref 250–450)
UIBC: 182 ug/dL

## 2018-09-14 LAB — FERRITIN: Ferritin: 93 ng/mL (ref 11–307)

## 2018-09-14 LAB — POCT HEMOGLOBIN-HEMACUE: Hemoglobin: 10.8 g/dL — ABNORMAL LOW (ref 12.0–15.0)

## 2018-09-14 MED ORDER — EPOETIN ALFA 10000 UNIT/ML IJ SOLN
INTRAMUSCULAR | Status: AC
  Administered 2018-09-14: 10000 [IU] via SUBCUTANEOUS
  Filled 2018-09-14: qty 1

## 2018-09-14 MED ORDER — EPOETIN ALFA 40000 UNIT/ML IJ SOLN: 30000.0000 [IU] | INTRAMUSCULAR | Status: DC

## 2018-09-14 MED ORDER — EPOETIN ALFA 20000 UNIT/ML IJ SOLN
INTRAMUSCULAR | Status: AC
  Administered 2018-09-14: 20000 [IU] via SUBCUTANEOUS
  Filled 2018-09-14: qty 1

## 2018-09-15 ENCOUNTER — Other Ambulatory Visit: Payer: Self-pay

## 2018-09-15 ENCOUNTER — Ambulatory Visit
Admission: RE | Admit: 2018-09-15 | Discharge: 2018-09-15 | Disposition: A | Discharge status: Home / Self Care | Payer: Medicare Other | Source: Ambulatory Visit | Attending: Internal Medicine | Admitting: Internal Medicine

## 2018-09-15 DIAGNOSIS — R911 Solitary pulmonary nodule: Secondary | ICD-10-CM | POA: Diagnosis not present

## 2018-09-15 DIAGNOSIS — Z122 Encounter for screening for malignant neoplasm of respiratory organs: Secondary | ICD-10-CM

## 2018-09-15 DIAGNOSIS — Z720 Tobacco use: Secondary | ICD-10-CM

## 2018-09-16 ENCOUNTER — Encounter: Payer: Self-pay | Admitting: Internal Medicine

## 2018-09-16 ENCOUNTER — Ambulatory Visit (INDEPENDENT_AMBULATORY_CARE_PROVIDER_SITE_OTHER): Payer: Medicare Other | Admitting: Internal Medicine

## 2018-09-16 ENCOUNTER — Ambulatory Visit (INDEPENDENT_AMBULATORY_CARE_PROVIDER_SITE_OTHER): Payer: Medicare Other

## 2018-09-16 VITALS — BP 132/76 | HR 89 | Temp 98.5°F | Ht 64.4 in | Wt 182.4 lb

## 2018-09-16 VITALS — BP 132/76 | HR 89 | Temp 98.2°F | Ht 64.4 in | Wt 182.4 lb

## 2018-09-16 DIAGNOSIS — R911 Solitary pulmonary nodule: Secondary | ICD-10-CM | POA: Diagnosis not present

## 2018-09-16 DIAGNOSIS — N184 Chronic kidney disease, stage 4 (severe): Secondary | ICD-10-CM | POA: Diagnosis not present

## 2018-09-16 DIAGNOSIS — J479 Bronchiectasis, uncomplicated: Secondary | ICD-10-CM | POA: Diagnosis not present

## 2018-09-16 DIAGNOSIS — E1122 Type 2 diabetes mellitus with diabetic chronic kidney disease: Secondary | ICD-10-CM | POA: Diagnosis not present

## 2018-09-16 DIAGNOSIS — I2510 Atherosclerotic heart disease of native coronary artery without angina pectoris: Secondary | ICD-10-CM

## 2018-09-16 DIAGNOSIS — L84 Corns and callosities: Secondary | ICD-10-CM

## 2018-09-16 DIAGNOSIS — Z0000 Encounter for general adult medical examination without abnormal findings: Secondary | ICD-10-CM

## 2018-09-16 DIAGNOSIS — Z712 Person consulting for explanation of examination or test findings: Secondary | ICD-10-CM

## 2018-09-16 DIAGNOSIS — Z720 Tobacco use: Secondary | ICD-10-CM

## 2018-09-16 DIAGNOSIS — Z794 Long term (current) use of insulin: Secondary | ICD-10-CM

## 2018-09-16 DIAGNOSIS — I129 Hypertensive chronic kidney disease with stage 1 through stage 4 chronic kidney disease, or unspecified chronic kidney disease: Secondary | ICD-10-CM

## 2018-09-16 LAB — POCT URINALYSIS DIPSTICK
Bilirubin, UA: NEGATIVE
Glucose, UA: NEGATIVE
Ketones, UA: NEGATIVE
Nitrite, UA: NEGATIVE
Protein, UA: POSITIVE — AB
Spec Grav, UA: 1.02 (ref 1.010–1.025)
Urobilinogen, UA: 0.2 E.U./dL
pH, UA: 5.5 (ref 5.0–8.0)

## 2018-09-16 LAB — POCT UA - MICROALBUMIN
Creatinine, POC: 300 mg/dL
Microalbumin Ur, POC: 150 mg/L

## 2018-09-16 MED ORDER — BENZONATATE 100 MG PO CAPS: 100.0000 mg | ORAL_CAPSULE | Freq: Four times a day (QID) | ORAL | 1 refills | Status: DC | PRN

## 2018-09-16 MED ORDER — DEXLANSOPRAZOLE 60 MG PO CPDR: 60.0000 mg | DELAYED_RELEASE_CAPSULE | Freq: Every day | ORAL | 0 refills | Status: DC

## 2018-09-16 NOTE — Progress Notes (Signed)
Subjective:   Miranda SCHLOTTMAN is a 73 y.o. female who presents for Medicare Annual (Subsequent) preventive examination.  Review of Systems:  n/a Cardiac Risk Factors include: diabetes mellitus;advanced age (>18mn, >>84women);hypertension;obesity (BMI >30kg/m2);sedentary lifestyle     Objective:     Vitals: BP 132/76 (BP Location: Left Arm, Patient Position: Sitting)   Pulse 89   Temp 98.2 F (36.8 C) (Oral)   Ht 5' 4.4" (1.636 m)   Wt 182 lb 6.4 oz (82.7 kg)   SpO2 97%   BMI 30.92 kg/m   Body mass index is 30.92 kg/m.  Advanced Directives 09/16/2018 09/29/2016  Does Patient Have a Medical Advance Directive? No No  Would patient like information on creating a medical advance directive? No - Patient declined No - Patient declined    Tobacco Social History   Tobacco Use  Smoking Status Former Smoker  . Packs/day: 0.25  . Years: 40.00  . Pack years: 10.00  . Types: Cigarettes  . Last attempt to quit: 04/2005  . Years since quitting: 13.4  Smokeless Tobacco Never Used     Counseling given: Not Answered   Clinical Intake:  Pre-visit preparation completed: Yes  Pain : No/denies pain Pain Score: 0-No pain     Nutritional Risks: None Diabetes: Yes CBG done?: No Did pt. bring in CBG monitor from home?: No  How often do you need to have someone help you when you read instructions, pamphlets, or other written materials from your doctor or pharmacy?: 1 - Never What is the last grade level you completed in school?: BS degree  Interpreter Needed?: No  Information entered by :: NAllen LPN  Past Medical History:  Diagnosis Date  . Anemia   . Diabetes mellitus   . Hyperlipidemia   . Hypertension   . Osteoporosis   . Sickle cell anemia (HCC)    Past Surgical History:  Procedure Laterality Date  . BONE RESECTION  01/2006   wrist  . 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 TUBAL LIGATION  04/28/1977   Family History  Problem Relation Age of Onset  . Diabetes  Mother   . Heart disease Mother   . Diabetes Father   . Heart disease Father   . Diabetes Sister   . Diabetes Maternal Aunt   . Diabetes Maternal Uncle   . Diabetes Maternal Grandmother   . Diabetes Maternal Grandfather    Social History   Socioeconomic History  . Marital status: Widowed    Spouse name: Not on file  . Number of children: Not on file  . Years of education: Not on file  . Highest education level: Not on file  Occupational History  . Occupation: retired  SScientific laboratory technician . Financial resource strain: Not hard at all  . Food insecurity:    Worry: Never true    Inability: Never true  . Transportation needs:    Medical: No    Non-medical: No  Tobacco Use  . Smoking status: Former Smoker    Packs/day: 0.25    Years: 40.00    Pack years: 10.00    Types: Cigarettes    Last attempt to quit: 04/2005    Years since quitting: 13.4  . Smokeless tobacco: Never Used  Substance and Sexual Activity  . Alcohol use: Yes    Comment: occasionally  . Drug use: No  . Sexual activity: Yes  Lifestyle  . Physical activity:    Days per week: 0 days    Minutes  per session: 0 min  . Stress: Not at all  Relationships  . Social connections:    Talks on phone: Not on file    Gets together: Not on file    Attends religious service: Not on file    Active member of club or organization: Not on file    Attends meetings of clubs or organizations: Not on file    Relationship status: Not on file  Other Topics Concern  . Not on file  Social History Narrative  . Not on file    Outpatient Encounter Medications as of 09/16/2018  Medication Sig  . aspirin 81 MG tablet Take 81 mg by mouth daily.  [name] atorvastatin (LIPITOR) 40 MG tablet Take 40 mg by mouth daily.  . Biotin (BIOTIN 5000) 5 MG CAPS Take by mouth daily.  . calcitRIOL (ROCALTROL) 0.5 MCG capsule Take 0.5 mcg by mouth daily. 2 tabs 1 day and 3 tabs the next day alternating 2 tabs and 3 tabs  . Glucosamine-Chondroitin  (GLUCOSAMINE CHONDR COMPLEX PO) Take 1 tablet by mouth daily.   [name] glucose blood (ONETOUCH VERIO) test strip 1 each by Other route daily. Use as instructed  . Insulin Detemir (LEVEMIR FLEXTOUCH) 100 UNIT/ML Pen Inject 16 Units into the skin daily. (Patient taking differently: Inject 18 Units into the skin daily. )  . insulin lispro (HUMALOG) 100 UNIT/ML injection Inject 4 Units into the skin 1 day or 1 dose.  Glory Rosebush DELICA LANCETS 50P MISC 1 each by Does not apply route daily. DX: E11.65  . Probiotic Product (PROBIOTIC ADVANCED PO) Take by mouth.  . quinapril-hydrochlorothiazide (ACCURETIC) 20-12.5 MG tablet Take 1/2 tablet by mouth daily  . [DISCONTINUED] benzonatate (TESSALON PERLES) 100 MG capsule Take 1 capsule (100 mg total) by mouth every 6 (six) hours as needed for cough.  [name] epoetin alfa (EPOGEN,PROCRIT) 54656 UNIT/ML injection 10,000 Units once. Takes every 2 weeks  . [DISCONTINUED] dexlansoprazole (DEXILANT) 60 MG capsule Take 1 capsule (60 mg total) by mouth daily. (Patient not taking: Reported on 08/24/2018)   No facility-administered encounter medications on file as of 09/16/2018.     Activities of Daily Living In your present state of health, do you have any difficulty performing the following activities: 09/16/2018  Hearing? Y  Comment maybe a little  Vision? Y  Comment when CBG is too high  Difficulty concentrating or making decisions? N  Walking or climbing stairs? N  Dressing or bathing? N  Doing errands, shopping? N  Preparing Food and eating ? N  Using the Toilet? N  In the past six months, have you accidently leaked urine? Y  Comment when rushing to get to bathroom  Do you have problems with loss of bowel control? N  Managing your Medications? N  Managing your Finances? N  Housekeeping or managing your Housekeeping? N  Some recent data might be hidden    Patient Care Team: Glendale Chard, MD as PCP - General (Internal Medicine) Foye Spurling, MD as  Consulting Physician (Endocrinology) Debara Pickett Nadean Corwin, MD as Consulting Physician (Cardiology) [name], MD as Consulting Physician (General Surgery) Rutherford Guys, MD as Consulting Physician (Ophthalmology)    Assessment:   This is a routine wellness examination for Rylin.  Exercise Activities and Dietary recommendations Current Exercise Habits: The patient does not participate in regular exercise at present, Exercise limited by: None identified  Goals    . Exercise 150 min/wk Moderate Activity       Fall Risk Fall Risk  09/16/2018 08/24/2018 08/04/2018 04/09/2018 09/29/2016  Falls in the past year? 0 0 0 No No  Risk for fall due to : Medication side effect - - - -  Follow up Falls prevention discussed;Education provided - - - -   Is the patient's home free of loose throw rugs in walkways, pet beds, electrical cords, etc?   yes      Grab bars in the bathroom? yes      Handrails on the stairs?   no      Adequate lighting?   yes  Timed Get Up and Go performed: n/a  Depression Screen PHQ 2/9 Scores 09/16/2018 08/24/2018 08/04/2018 04/09/2018  PHQ - 2 Score 0 0 0 0  PHQ- 9 Score 0 - - -     Cognitive Function     6CIT Screen 09/16/2018  What Year? 0 points  What month? 0 points  What time? 0 points  Count back from 20 0 points  Months in reverse 0 points  Repeat phrase 0 points  Total Score 0     There is no immunization history on file for this patient.  Qualifies for Shingles Vaccine? yes  Screening Tests Health Maintenance  Topic Date Due  . HEMOGLOBIN A1C  May 11, 1946  . Hepatitis C Screening  Mar 07, 1946  . DEXA SCAN  05/22/2012  . PNA vac Low Risk Adult (2 of 2 - PPSV23) 07/09/2017  . OPHTHALMOLOGY EXAM  02/23/2019  . FOOT EXAM  04/10/2019  . MAMMOGRAM  01/27/2020  . COLONOSCOPY  01/19/2022  . TETANUS/TDAP  04/08/2023  . INFLUENZA VACCINE  Completed    Cancer Screenings: Lung: Low Dose CT Chest recommended if Age 56-80 years, 30 pack-year currently  smoking OR have quit w/in 15years. Patient does not qualify. Breast:  Up to date on Mammogram? Yes   Up to date of Bone Density/Dexa? Yes Colorectal: up to date  Additional Screenings: : Hepatitis C Screening:      Plan:   Wants to start exercise.  I have personally reviewed and noted the following in the patient's chart:   . Medical and social history . Use of alcohol, tobacco or illicit drugs  . Current medications and supplements . Functional ability and status . Nutritional status . Physical activity . Advanced directives . List of other physicians . Hospitalizations, surgeries, and ER visits in previous 12 months . Vitals . Screenings to include cognitive, depression, and falls . Referrals and appointments  In addition, I have reviewed and discussed with patient certain preventive protocols, quality metrics, and best practice recommendations. A written personalized care plan for preventive services as well as general preventive health recommendations were provided to patient.     Kellie Simmering, LPN  [id number]

## 2018-09-16 NOTE — Patient Instructions (Signed)

## 2018-09-16 NOTE — Patient Instructions (Signed)
Miranda Roman , Thank you for taking time to come for your Medicare Wellness Visit. I appreciate your ongoing commitment to your health goals. Please review the following plan we discussed and let me know if I can assist you in the future.   Screening recommendations/referrals: Colonoscopy: 01/2012 Mammogram: 01/2018 Bone Density: 02/2015 Recommended yearly ophthalmology/optometry visit for glaucoma screening and checkup Recommended yearly dental visit for hygiene and checkup  Vaccinations: Influenza vaccine: 04/2018 Pneumococcal vaccine: 07/2016 Tdap vaccine: 04/2013 Shingles vaccine: discussed    Advanced directives: Advance directive discussed with you today. Even though you declined this today please call our office should you change your mind and we can give you the proper paperwork for you to fill out.   Conditions/risks identified: obese  Next appointment: 01/17/2019 at [facility] 65 Years and Older, Female Preventive care refers to lifestyle choices and visits with your health care provider that can promote health and wellness. What does preventive care include?  A yearly physical exam. This is also called an annual well check.  Dental exams once or twice a year.  Routine eye exams. Ask your health care provider how often you should have your eyes checked.  Personal lifestyle choices, including:  Daily care of your teeth and gums.  Regular physical activity.  Eating a healthy diet.  Avoiding tobacco and drug use.  Limiting alcohol use.  Practicing safe sex.  Taking low-dose aspirin every day.  Taking vitamin and mineral supplements as recommended by your health care provider. What happens during an annual well check? The services and screenings done by your health care provider during your annual well check will depend on your age, overall health, lifestyle risk factors, and family history of disease. Counseling  Your health care provider may ask you  questions about your:  Alcohol use.  Tobacco use.  Drug use.  Emotional well-being.  Home and relationship well-being.  Sexual activity.  Eating habits.  History of falls.  Memory and ability to understand (cognition).  Work and work Statistician.  Reproductive health. Screening  You may have the following tests or measurements:  Height, weight, and BMI.  Blood pressure.  Lipid and cholesterol levels. These may be checked every 5 years, or more frequently if you are over 50 years old.  Skin check.  Lung cancer screening. You may have this screening every year starting at age 57 if you have a 30-pack-year history of smoking and currently smoke or have quit within the past 15 years.  Fecal occult blood test (FOBT) of the stool. You may have this test every year starting at age 72.  Flexible sigmoidoscopy or colonoscopy. You may have a sigmoidoscopy every 5 years or a colonoscopy every 10 years starting at age 10.  Hepatitis C blood test.  Hepatitis B blood test.  Sexually transmitted disease (STD) testing.  Diabetes screening. This is done by checking your blood sugar (glucose) after you have not eaten for a while (fasting). You may have this done every 1-3 years.  Bone density scan. This is done to screen for osteoporosis. You may have this done starting at age 72.  Mammogram. This may be done every 1-2 years. Talk to your health care provider about how often you should have regular mammograms. Talk with your health care provider about your test results, treatment options, and if necessary, the need for more tests. Vaccines  Your health care provider may recommend certain vaccines, such as:  Influenza vaccine. This is recommended every  year.  Tetanus, diphtheria, and acellular pertussis (Tdap, Td) vaccine. You may need a Td booster every 10 years.  Zoster vaccine. You may need this after age 72.  Pneumococcal 13-valent conjugate (PCV13) vaccine. One dose is  recommended after age 72.  Pneumococcal polysaccharide (PPSV23) vaccine. One dose is recommended after age 72. Talk to your health care provider about which screenings and vaccines you need and how often you need them. This information is not intended to replace advice given to you by your health care provider. Make sure you discuss any questions you have with your health care provider. Document Released: 07/20/2015 Document Revised: 03/12/2016 Document Reviewed: 04/24/2015 Elsevier Interactive Patient Education  2017 [place] Prevention in the Home Falls can cause injuries. They can happen to people of all ages. There are many things you can do to make your home safe and to help prevent falls. What can I do on the outside of my home?  Regularly fix the edges of walkways and driveways and fix any cracks.  Remove anything that might make you trip as you walk through a door, such as a raised step or threshold.  Trim any bushes or trees on the path to your home.  Use bright outdoor lighting.  Clear any walking paths of anything that might make someone trip, such as rocks or tools.  Regularly check to see if handrails are loose or broken. Make sure that both sides of any steps have handrails.  Any raised decks and porches should have guardrails on the edges.  Have any leaves, snow, or ice cleared regularly.  Use sand or salt on walking paths during winter.  Clean up any spills in your garage right away. This includes oil or grease spills. What can I do in the bathroom?  Use night lights.  Install grab bars by the toilet and in the tub and shower. Do not use towel bars as grab bars.  Use non-skid mats or decals in the tub or shower.  If you need to sit down in the shower, use a plastic, non-slip stool.  Keep the floor dry. Clean up any water that spills on the floor as soon as it happens.  Remove soap buildup in the tub or shower regularly.  Attach bath mats  securely with double-sided non-slip rug tape.  Do not have throw rugs and other things on the floor that can make you trip. What can I do in the bedroom?  Use night lights.  Make sure that you have a light by your bed that is easy to reach.  Do not use any sheets or blankets that are too big for your bed. They should not hang down onto the floor.  Have a firm chair that has side arms. You can use this for support while you get dressed.  Do not have throw rugs and other things on the floor that can make you trip. What can I do in the kitchen?  Clean up any spills right away.  Avoid walking on wet floors.  Keep items that you use a lot in easy-to-reach places.  If you need to reach something above you, use a strong step stool that has a grab bar.  Keep electrical cords out of the way.  Do not use floor polish or wax that makes floors slippery. If you must use wax, use non-skid floor wax.  Do not have throw rugs and other things on the floor that can make you trip. What can  I do with my stairs?  Do not leave any items on the stairs.  Make sure that there are handrails on both sides of the stairs and use them. Fix handrails that are broken or loose. Make sure that handrails are as long as the stairways.  Check any carpeting to make sure that it is firmly attached to the stairs. Fix any carpet that is loose or worn.  Avoid having throw rugs at the top or bottom of the stairs. If you do have throw rugs, attach them to the floor with carpet tape.  Make sure that you have a light switch at the top of the stairs and the bottom of the stairs. If you do not have them, ask someone to add them for you. What else can I do to help prevent falls?  Wear shoes that:  Do not have high heels.  Have rubber bottoms.  Are comfortable and fit you well.  Are closed at the toe. Do not wear sandals.  If you use a stepladder:  Make sure that it is fully opened. Do not climb a closed  stepladder.  Make sure that both sides of the stepladder are locked into place.  Ask someone to hold it for you, if possible.  Clearly mark and make sure that you can see:  Any grab bars or handrails.  First and last steps.  Where the edge of each step is.  Use tools that help you move around (mobility aids) if they are needed. These include:  Canes.  Walkers.  Scooters.  Crutches.  Turn on the lights when you go into a dark area. Replace any light bulbs as soon as they burn out.  Set up your furniture so you have a clear path. Avoid moving your furniture around.  If any of your floors are uneven, fix them.  If there are any pets around you, be aware of where they are.  Review your medicines with your doctor. Some medicines can make you feel dizzy. This can increase your chance of falling. Ask your doctor what other things that you can do to help prevent falls. This information is not intended to replace advice given to you by your health care provider. Make sure you discuss any questions you have with your health care provider. Document Released: 04/19/2009 Document Revised: 11/29/2015 Document Reviewed: 07/28/2014 Elsevier Interactive Patient Education  2017 [employer].  Exercise  Exercising to Stay Healthy To become healthy and stay healthy, it is recommended that you do moderate-intensity and vigorous-intensity exercise. You can tell that you are exercising at a moderate intensity if your heart starts beating faster and you start breathing faster but can still hold a conversation. You can tell that you are exercising at a vigorous intensity if you are breathing much harder and faster and cannot hold a conversation while exercising. Exercising regularly is important. It has many health benefits, such as:  Improving overall fitness, flexibility, and endurance.  Increasing bone density.  Helping with weight control.  Decreasing body fat.  Increasing muscle  strength.  Reducing stress and tension.  Improving overall health. How often should I exercise? Choose an activity that you enjoy, and set realistic goals. Your health care provider can help you make an activity plan that works for you. Exercise regularly as told by your health care provider. This may include:  Doing strength training two times a week, such as: ? Lifting weights. ? Using resistance bands. ? Push-ups. ? Sit-ups. ? Yoga.  Doing  a certain intensity of exercise for a given amount of time. Choose from these options: ? A total of 150 minutes of moderate-intensity exercise every week. ? A total of 75 minutes of vigorous-intensity exercise every week. ? A mix of moderate-intensity and vigorous-intensity exercise every week. Children, pregnant women, people who have not exercised regularly, people who are overweight, and older adults may need to talk with a health care provider about what activities are safe to do. If you have a medical condition, be sure to talk with your health care provider before you start a new exercise program. What are some exercise ideas? Moderate-intensity exercise ideas include:  Walking 1 mile (1.6 km) in about 15 minutes.  Biking.  Hiking.  Golfing.  Dancing.  Water aerobics. Vigorous-intensity exercise ideas include:  Walking 4.5 miles (7.2 km) or more in about 1 hour.  Jogging or running 5 miles (8 km) in about 1 hour.  Biking 10 miles (16.1 km) or more in about 1 hour.  Lap swimming.  Roller-skating or in-line skating.  Cross-country skiing.  Vigorous competitive sports, such as football, basketball, and soccer.  Jumping rope.  Aerobic dancing. What are some everyday activities that can help me to get exercise?  [place] work, such as: ? Pushing a Conservation officer, nature. ? Raking and bagging leaves.  Washing your car.  Pushing a stroller.  Shoveling snow.  Gardening.  Washing windows or floors. How can I be more active in my  day-to-day activities?  Use stairs instead of an elevator.  Take a walk during your lunch break.  If you drive, park your car farther away from your work or school.  If you take public transportation, get off one stop early and walk the rest of the way.  Stand up or walk around during all of your indoor phone calls.  Get up, stretch, and walk around every 30 minutes throughout the day.  Enjoy exercise with a friend. Support to continue exercising will help you keep a regular routine of activity. What guidelines can I follow while exercising?  Before you start a new exercise program, talk with your health care provider.  Do not exercise so much that you hurt yourself, feel dizzy, or get very short of breath.  Wear comfortable clothes and wear shoes with good support.  Drink plenty of water while you exercise to prevent dehydration or heat stroke.  Work out until your breathing and your heartbeat get faster. Where to find more information  U.S. Department of Health and Human Services: [employer].at  Centers for Disease Control and Prevention (CDC): http://www.wolf.info/ Summary  Exercising regularly is important. It will improve your overall fitness, flexibility, and endurance.  Regular exercise also will improve your overall health. It can help you control your weight, reduce stress, and improve your bone density.  Do not exercise so much that you hurt yourself, feel dizzy, or get very short of breath.  Before you start a new exercise program, talk with your health care provider. This information is not intended to replace advice given to you by your health care provider. Make sure you discuss any questions you have with your health care provider. Document Released: 07/26/2010 Document Revised: 05/14/2017 Document Reviewed: 05/14/2017 Elsevier Interactive Patient Education  2019 [employer].

## 2018-09-17 LAB — HEMOGLOBIN A1C
Est. average glucose Bld gHb Est-mCnc: 169 mg/dL
Hgb A1c MFr Bld: 7.5 % — ABNORMAL HIGH (ref 4.8–5.6)

## 2018-09-17 LAB — LIPID PANEL
Chol/HDL Ratio: 3.1 ratio (ref 0.0–4.4)
Cholesterol, Total: 156 mg/dL (ref 100–199)
HDL: 50 mg/dL (ref >39)
LDL Calculated: 89 mg/dL (ref 0–99)
Triglycerides: 84 mg/dL (ref 0–149)
VLDL Cholesterol Cal: 17 mg/dL (ref 5–40)

## 2018-09-17 LAB — TSH: TSH: 1.27 u[IU]/mL (ref 0.450–4.500)

## 2018-09-20 ENCOUNTER — Telehealth: Payer: Self-pay

## 2018-09-20 NOTE — Telephone Encounter (Signed)
-----  Message from Glendale Chard, MD sent at 09/20/2018  1:54 PM EDT ----- Yes, she will be going to pulmonary. If her abdominal discomfort worsens, she needs to go to ER. I do not think she has pancreatitis.

## 2018-09-20 NOTE — Telephone Encounter (Signed)
Patient notified.

## 2018-09-21 NOTE — Progress Notes (Signed)
Subjective:     Patient ID: Miranda Roman , female    DOB: 22-Oct-1945 , 73 y.o.   MRN: [id number]   Chief Complaint  Patient presents with  . Annual Exam  . Diabetes  . Hypertension    HPI  She is here today for a full physical examination. She is no longer followed by GYN. She has no specific concerns at this time.   Diabetes  She presents for her follow-up diabetic visit. She has type 2 diabetes mellitus. Her disease course has been improving. There are no hypoglycemic associated symptoms. Pertinent negatives for diabetes include no blurred vision and no chest pain. There are no hypoglycemic complications. Diabetic complications include nephropathy. Risk factors for coronary artery disease include diabetes mellitus, dyslipidemia, hypertension, post-menopausal and sedentary lifestyle. Her home blood glucose trend is fluctuating minimally. An ACE inhibitor/angiotensin II receptor blocker is being taken. She sees a podiatrist.Eye exam is current.  Hypertension  This is a chronic problem. The current episode started more than 1 year ago. The problem has been gradually improving since onset. The problem is controlled. Pertinent negatives include no blurred vision, chest pain, palpitations or shortness of breath.   She reports compliance with meds.   Past Medical History:  Diagnosis Date  . Anemia   . Diabetes mellitus   . Hyperlipidemia   . Hypertension   . Osteoporosis   . Sickle cell anemia (HCC)      Family History  Problem Relation Age of Onset  . Diabetes Mother   . Heart disease Mother   . Diabetes Father   . Heart disease Father   . Diabetes Sister   . Diabetes Maternal Aunt   . Diabetes Maternal Uncle   . Diabetes Maternal Grandmother   . Diabetes Maternal Grandfather      Current Outpatient Medications:  .  aspirin 81 MG tablet, Take 81 mg by mouth daily., Disp: , Rfl:  .  atorvastatin (LIPITOR) 40 MG tablet, Take 40 mg by mouth daily., Disp: , Rfl:  .   benzonatate (TESSALON PERLES) 100 MG capsule, Take 1 capsule (100 mg total) by mouth every 6 (six) hours as needed for cough., Disp: 30 capsule, Rfl: 1 .  Biotin (BIOTIN 5000) 5 MG CAPS, Take by mouth daily., Disp: , Rfl:  .  calcitRIOL (ROCALTROL) 0.5 MCG capsule, Take 0.5 mcg by mouth daily. 2 tabs 1 day and 3 tabs the next day alternating 2 tabs and 3 tabs, Disp: , Rfl:  .  dexlansoprazole (DEXILANT) 60 MG capsule, Take 1 capsule (60 mg total) by mouth daily., Disp: 30 capsule, Rfl: 0 .  epoetin alfa (EPOGEN,PROCRIT) 28366 UNIT/ML injection, 10,000 Units once. Takes every 2 weeks, Disp: , Rfl:  .  Glucosamine-Chondroitin (GLUCOSAMINE CHONDR COMPLEX PO), Take 1 tablet by mouth daily. , Disp: , Rfl:  .  glucose blood (ONETOUCH VERIO) test strip, 1 each by Other route daily. Use as instructed, Disp: , Rfl:  .  Insulin Detemir (LEVEMIR FLEXTOUCH) 100 UNIT/ML Pen, Inject 16 Units into the skin daily. (Patient taking differently: Inject 18 Units into the skin daily. ), Disp: 15 pen, Rfl: 1 .  insulin lispro (HUMALOG) 100 UNIT/ML injection, Inject 4 Units into the skin 1 day or 1 dose., Disp: , Rfl:  .  ONETOUCH DELICA LANCETS 29U MISC, 1 each by Does not apply route daily. DX: E11.65, Disp: 150 each, Rfl: 11 .  Probiotic Product (PROBIOTIC ADVANCED PO), Take by mouth., Disp: , Rfl:  .  quinapril-hydrochlorothiazide (ACCURETIC) 20-12.5 MG tablet, Take 1/2 tablet by mouth daily, Disp: 90 tablet, Rfl: 1   No Known Allergies     No LMP recorded. Patient is postmenopausal..  Negative for: breast discharge, breast lump(s), breast pain and breast self exam. Associated symptoms include abnormal vaginal bleeding. Pertinent negatives include abnormal bleeding (hematology), anxiety, decreased libido, depression, difficulty falling sleep, dyspareunia, history of infertility, nocturia, sexual dysfunction, sleep disturbances, urinary incontinence, urinary urgency, vaginal discharge and vaginal itching. Diet  regular.The patient states her exercise level is   minimal.   . The patient's tobacco use is:  Social History   Tobacco Use  Smoking Status Former Smoker  . Packs/day: 0.25  . Years: 40.00  . Pack years: 10.00  . Types: Cigarettes  . Last attempt to quit: 04/2005  . Years since quitting: 13.4  Smokeless Tobacco Never Used  . She has been exposed to passive smoke. The patient's alcohol use is:  Social History   Substance and Sexual Activity  Alcohol Use Yes   Comment: occasionally    Review of Systems  Constitutional: Negative.   HENT: Negative.   Eyes: Negative.  Negative for blurred vision.  Respiratory: Negative.  Negative for shortness of breath.   Cardiovascular: Negative.  Negative for chest pain and palpitations.  Gastrointestinal: Negative.   Endocrine: Negative.   Genitourinary: Negative.   Musculoskeletal: Negative.   Skin: Negative.   Allergic/Immunologic: Negative.   Neurological: Negative.   Hematological: Negative.   Psychiatric/Behavioral: Negative.      Today's Vitals   09/16/18 1126  BP: 132/76  Pulse: 89  Temp: 98.5 F (36.9 C)  TempSrc: Oral  Weight: 182 lb 6.4 oz (82.7 kg)  Height: 5' 4.4" (1.636 m)   Body mass index is 30.92 kg/m.   Objective:  Physical Exam Vitals signs and nursing note reviewed.  Constitutional:      Appearance: Normal appearance.  HENT:     Head: Normocephalic and atraumatic.     Right Ear: Tympanic membrane, ear canal and external ear normal.     Left Ear: Tympanic membrane, ear canal and external ear normal.     Nose: Nose normal.     Mouth/Throat:     Mouth: Mucous membranes are moist.     Pharynx: Oropharynx is clear.  Eyes:     Extraocular Movements: Extraocular movements intact.     Conjunctiva/sclera: Conjunctivae normal.     Pupils: Pupils are equal, round, and reactive to light.  Neck:     Musculoskeletal: Normal range of motion and neck supple.  Cardiovascular:     Rate and Rhythm: Normal rate and  regular rhythm.     Pulses: Normal pulses.          Dorsalis pedis pulses are 2+ on the right side and 2+ on the left side.     Heart sounds: Normal heart sounds.  Pulmonary:     Effort: Pulmonary effort is normal.     Breath sounds: Normal breath sounds.  Chest:     Breasts:        Right: Normal. No bleeding, inverted nipple, mass or nipple discharge.        Left: Normal. No swelling, bleeding, inverted nipple, mass or nipple discharge.  Abdominal:     General: Bowel sounds are normal.     Palpations: Abdomen is soft.  Genitourinary:    Comments: deferred Musculoskeletal: Normal range of motion.  Feet:     Right foot:     Protective Sensation:  5 sites tested. 5 sites sensed.     Skin integrity: Callus present.     Left foot:     Protective Sensation: 5 sites tested. 5 sites sensed.     Skin integrity: Skin integrity normal.     Comments: Callus on r foot Skin:    General: Skin is warm and dry.  Neurological:     General: No focal deficit present.     Mental Status: She is alert and oriented to person, place, and time.  Psychiatric:        Mood and Affect: Mood normal.        Behavior: Behavior normal.         Assessment And Plan:     1. Routine general medical examination at health care facility  A full exam was performed. Importance of monthly self breast exams was discussed with the patient. PATIENT HAS BEEN ADVISED TO GET 30-45 MINUTES REGULAR EXERCISE NO LESS THAN FOUR TO FIVE DAYS PER WEEK - BOTH WEIGHTBEARING EXERCISES AND AEROBIC ARE RECOMMENDED.  SHE IS ADVISED TO FOLLOW A HEALTHY DIET WITH AT LEAST SIX FRUITS/VEGGIES PER DAY, DECREASE INTAKE OF RED MEAT, AND TO INCREASE FISH INTAKE TO TWO DAYS PER WEEK.  MEATS/FISH SHOULD NOT BE FRIED, BAKED OR BROILED IS PREFERABLE.  I SUGGEST WEARING SPF 50 SUNSCREEN ON EXPOSED PARTS AND ESPECIALLY WHEN IN THE DIRECT SUNLIGHT FOR AN EXTENDED PERIOD OF TIME.  PLEASE AVOID FAST FOOD RESTAURANTS AND INCREASE YOUR WATER  INTAKE.   2. Diabetes mellitus with stage 4 chronic kidney disease ([place])  Diabetic foot exam was performed. I DISCUSSED WITH THE PATIENT AT LENGTH REGARDING THE GOALS OF GLYCEMIC CONTROL AND POSSIBLE LONG-TERM COMPLICATIONS.  I  ALSO STRESSED THE IMPORTANCE OF COMPLIANCE WITH HOME GLUCOSE MONITORING, DIETARY RESTRICTIONS INCLUDING AVOIDANCE OF SUGARY DRINKS/PROCESSED FOODS,  ALONG WITH REGULAR EXERCISE.  I  ALSO STRESSED THE IMPORTANCE OF ANNUAL EYE EXAMS, SELF FOOT CARE AND COMPLIANCE WITH OFFICE VISITS.  - Lipid Profile - Hemoglobin A1c - TSH  3. Hypertensive nephropathy  Fair control. Patient is aware optimal bp is less than 130/80. She is encouraged to incorporate more exercise into her daily routine.   4. Atherosclerosis of native coronary artery of native heart without angina pectoris  Importance of statin therapy, tight BP/BS control was discussed with the patient. She is followed by cardiology.   5. Pulmonary nodule  Low dose CT chest results reviewed in full detail during her visit.  Patient advised that she will need f/u studies in one year due to h/o tobacco use.   - Ambulatory referral to Pulmonology  6. Bronchiectasis without complication ([place])  Incidental finding on CT scan. I will refer her to Pulmonary for further evaluation to determine if treatment is needed.   - Ambulatory referral to Pulmonology  7. Callus of foot  - Ambulatory referral to Podiatry        [name], MD

## 2018-09-23 ENCOUNTER — Other Ambulatory Visit: Payer: Self-pay

## 2018-09-23 MED ORDER — GLUCOSE BLOOD VI STRP: ORAL_STRIP | 11 refills | Status: DC

## 2018-09-28 ENCOUNTER — Other Ambulatory Visit: Payer: Self-pay

## 2018-09-28 ENCOUNTER — Ambulatory Visit (HOSPITAL_COMMUNITY)
Admission: RE | Admit: 2018-09-28 | Discharge: 2018-09-28 | Disposition: A | Discharge status: Home / Self Care | Payer: Medicare Other | Source: Ambulatory Visit | Attending: Nephrology | Admitting: Nephrology

## 2018-09-28 VITALS — BP 111/94 | HR 93 | Temp 98.7°F | Resp 20

## 2018-09-28 DIAGNOSIS — E1122 Type 2 diabetes mellitus with diabetic chronic kidney disease: Secondary | ICD-10-CM | POA: Diagnosis not present

## 2018-09-28 DIAGNOSIS — N184 Chronic kidney disease, stage 4 (severe): Secondary | ICD-10-CM | POA: Diagnosis not present

## 2018-09-28 LAB — POCT HEMOGLOBIN-HEMACUE: Hemoglobin: 11.5 g/dL — ABNORMAL LOW (ref 12.0–15.0)

## 2018-09-28 MED ORDER — EPOETIN ALFA 20000 UNIT/ML IJ SOLN
INTRAMUSCULAR | Status: AC
  Administered 2018-09-28: 20000 [IU] via SUBCUTANEOUS
  Filled 2018-09-28: qty 1

## 2018-09-28 MED ORDER — EPOETIN ALFA 10000 UNIT/ML IJ SOLN
INTRAMUSCULAR | Status: AC
  Administered 2018-09-28: 10000 [IU] via SUBCUTANEOUS
  Filled 2018-09-28: qty 1

## 2018-09-28 MED ORDER — EPOETIN ALFA 40000 UNIT/ML IJ SOLN: 30000.0000 [IU] | INTRAMUSCULAR | Status: DC

## 2018-10-08 ENCOUNTER — Telehealth: Payer: Self-pay

## 2018-10-08 NOTE — Telephone Encounter (Signed)
The patient said that she has a month of Dexilant left and will get the Prilosec o-t-c once she is finished with them.

## 2018-10-08 NOTE — Telephone Encounter (Signed)
-----  Message from Glendale Chard, MD sent at 10/07/2018  4:29 PM EDT ----- Regarding: RE: Benzonate cough capsules She can try Prilosec OTC for two weeks. She can get this over the counter.  ----- Message ----- From: Michelle Nasuti, [place] Sent: 10/07/2018   4:03 PM EDT To: Glendale Chard, MD Subject: Benzonate cough capsules                       The patient said that her insurance doesn't cover the benzonate cough capsules and the dexilant costs $100 for 30 days supply because it's a tier 3.  The pt wants to know if there are some alternatives for the Dexilant? The patient said that she doesn't need the cough capsules at the moment because she isn't having a cough right now.

## 2018-10-08 NOTE — Addendum Note (Signed)
Addended by: [name] on: 10/08/2018 05:44 PM   Modules accepted: Orders

## 2018-10-12 ENCOUNTER — Other Ambulatory Visit: Payer: Self-pay

## 2018-10-12 ENCOUNTER — Ambulatory Visit (HOSPITAL_COMMUNITY)
Admission: RE | Admit: 2018-10-12 | Discharge: 2018-10-12 | Disposition: A | Discharge status: Home / Self Care | Payer: Medicare Other | Source: Ambulatory Visit | Attending: Nephrology | Admitting: Nephrology

## 2018-10-12 VITALS — BP 137/77 | HR 88 | Temp 98.7°F | Resp 18

## 2018-10-12 DIAGNOSIS — E1122 Type 2 diabetes mellitus with diabetic chronic kidney disease: Secondary | ICD-10-CM | POA: Diagnosis not present

## 2018-10-12 DIAGNOSIS — N184 Chronic kidney disease, stage 4 (severe): Secondary | ICD-10-CM | POA: Diagnosis not present

## 2018-10-12 LAB — POCT HEMOGLOBIN-HEMACUE: Hemoglobin: 11.9 g/dL — ABNORMAL LOW (ref 12.0–15.0)

## 2018-10-12 MED ORDER — EPOETIN ALFA 20000 UNIT/ML IJ SOLN
INTRAMUSCULAR | Status: AC
  Administered 2018-10-12: 20000 [IU] via SUBCUTANEOUS
  Filled 2018-10-12: qty 1

## 2018-10-12 MED ORDER — EPOETIN ALFA 10000 UNIT/ML IJ SOLN
INTRAMUSCULAR | Status: AC
  Administered 2018-10-12: 10000 [IU] via SUBCUTANEOUS
  Filled 2018-10-12: qty 1

## 2018-10-12 MED ORDER — EPOETIN ALFA 40000 UNIT/ML IJ SOLN: 30000.0000 [IU] | INTRAMUSCULAR | Status: DC

## 2018-10-26 ENCOUNTER — Other Ambulatory Visit: Payer: Self-pay

## 2018-10-26 ENCOUNTER — Ambulatory Visit (HOSPITAL_COMMUNITY)
Admission: RE | Admit: 2018-10-26 | Discharge: 2018-10-26 | Disposition: A | Discharge status: Home / Self Care | Payer: Medicare Other | Source: Ambulatory Visit | Attending: Nephrology | Admitting: Nephrology

## 2018-10-26 VITALS — BP 144/74 | HR 99 | Temp 96.6°F

## 2018-10-26 DIAGNOSIS — E1122 Type 2 diabetes mellitus with diabetic chronic kidney disease: Secondary | ICD-10-CM | POA: Diagnosis not present

## 2018-10-26 DIAGNOSIS — N184 Chronic kidney disease, stage 4 (severe): Secondary | ICD-10-CM | POA: Diagnosis not present

## 2018-10-26 LAB — IRON AND TIBC
Iron: 93 ug/dL (ref 28–170)
Saturation Ratios: 32 % — ABNORMAL HIGH (ref 10.4–31.8)
TIBC: 287 ug/dL (ref 250–450)
UIBC: 194 ug/dL

## 2018-10-26 LAB — FERRITIN: Ferritin: 93 ng/mL (ref 11–307)

## 2018-10-26 LAB — POCT HEMOGLOBIN-HEMACUE: Hemoglobin: 11.6 g/dL — ABNORMAL LOW (ref 12.0–15.0)

## 2018-10-26 MED ORDER — EPOETIN ALFA 10000 UNIT/ML IJ SOLN
INTRAMUSCULAR | Status: AC
  Administered 2018-10-26: 10000 [IU] via SUBCUTANEOUS
  Filled 2018-10-26: qty 1

## 2018-10-26 MED ORDER — EPOETIN ALFA 40000 UNIT/ML IJ SOLN: 30000.0000 [IU] | INTRAMUSCULAR | Status: DC

## 2018-10-26 MED ORDER — EPOETIN ALFA 20000 UNIT/ML IJ SOLN
INTRAMUSCULAR | Status: AC
  Administered 2018-10-26: 20000 [IU] via SUBCUTANEOUS
  Filled 2018-10-26: qty 1

## 2018-10-28 ENCOUNTER — Ambulatory Visit (INDEPENDENT_AMBULATORY_CARE_PROVIDER_SITE_OTHER): Payer: Medicare Other | Admitting: Internal Medicine

## 2018-10-28 ENCOUNTER — Telehealth: Payer: Self-pay

## 2018-10-28 ENCOUNTER — Encounter: Payer: Self-pay | Admitting: Internal Medicine

## 2018-10-28 ENCOUNTER — Other Ambulatory Visit: Payer: Self-pay

## 2018-10-28 VITALS — Ht 64.4 in

## 2018-10-28 DIAGNOSIS — L299 Pruritus, unspecified: Secondary | ICD-10-CM | POA: Diagnosis not present

## 2018-10-28 DIAGNOSIS — E6609 Other obesity due to excess calories: Secondary | ICD-10-CM

## 2018-10-28 DIAGNOSIS — R208 Other disturbances of skin sensation: Secondary | ICD-10-CM

## 2018-10-28 DIAGNOSIS — Z6830 Body mass index (BMI) 30.0-30.9, adult: Secondary | ICD-10-CM

## 2018-10-28 MED ORDER — LORATADINE 10 MG PO TABS: 10.0000 mg | ORAL_TABLET | Freq: Every day | ORAL | 2 refills | Status: DC

## 2018-10-28 NOTE — Telephone Encounter (Signed)
The pt said that she is having some stinging all over her body and that she didn't know if it is coming from one of her medications and if it is something that she can take for the stinging.  The pt was asked if she is willing to do a virtual appointment this afternoon and the pt said yes.

## 2018-10-28 NOTE — Telephone Encounter (Signed)
Returning call regarding message left my pt about "skin stinging" no message can be left because voicemail is full

## 2018-10-28 NOTE — Progress Notes (Addendum)
Virtual Visit via Video   This visit type was conducted due to national recommendations for restrictions regarding the COVID-19 Pandemic (e.g. social distancing) in an effort to limit this patient's exposure and mitigate transmission in our community.  Due to her co-morbid illnesses, this patient is at least at moderate risk for complications without adequate follow up.  This format is felt to be most appropriate for this patient at this time.  All issues noted in this document were discussed and addressed.  A limited physical exam was performed with this format.    This visit type was conducted due to national recommendations for restrictions regarding the COVID-19 Pandemic (e.g. social distancing) in an effort to limit this patient's exposure and mitigate transmission in our community.  Patients identity confirmed using two different identifiers.  This format is felt to be most appropriate for this patient at this time.  All issues noted in this document were discussed and addressed.  No physical exam was performed (except for noted visual exam findings with Video Visits).    Date:  10/28/2018   ID:  Miranda Roman, DOB 1945-10-26, MRN [id number]  Patient Location:  Home  Provider location:   Office    Chief Complaint:  "Stinging all over"  History of Present Illness:    Miranda Roman is a 73 y.o. female who presents via video conferencing for a telehealth visit today.    The patient does not have symptoms concerning for COVID-19 infection (fever, chills, cough, or new shortness of breath).   She presents for virtual visit today for further evaluation of "stinging sensation". She prefers this method of contact due to COVID-19 pandemic. She reports this is a generalizing burning on her skin.  She states it feels as if pepper is on her skin. She reports feeling this from her head down to her toes. She is not sure what is triggering her sx. Her sx started about 4 days ago.  Denies  eating anything new. Denies using new soaps/detergents.     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   atorvastatin (LIPITOR) 40 MG tablet Take 40 mg by mouth daily.   calcitRIOL (ROCALTROL) 0.5 MCG capsule Take 0.5 mcg by mouth daily. 2 tabs 1 day and 3 tabs the next day alternating 2 tabs and 3 tabs   dexlansoprazole (DEXILANT) 60 MG capsule Take 1 capsule (60 mg total) by mouth daily.   epoetin alfa (EPOGEN,PROCRIT) 78295 UNIT/ML injection 10,000 Units once. Takes every 2 weeks   glucose blood (ONETOUCH VERIO) test strip Use as instructed to check blood sugars 1 time per day e11.22   Insulin Detemir (LEVEMIR FLEXTOUCH) 100 UNIT/ML Pen Inject 16 Units into the skin daily. (Patient taking differently: Inject 18 Units into the skin daily. )   insulin lispro (HUMALOG) 100 UNIT/ML injection Inject 4 Units into the skin 3 (three) times daily before meals.   ONETOUCH DELICA LANCETS 62Z MISC 1 each by Does not apply route daily. DX: E11.65   quinapril-hydrochlorothiazide (ACCURETIC) 20-12.5 MG tablet Take  1/2 tablet by mouth daily     Allergies:   Patient has no known allergies.   Social History   Tobacco Use   Smoking status: Former Smoker    Packs/day: 0.25    Years: 40.00    Pack years: 10.00    Types: Cigarettes    Last attempt to quit: 04/2005    Years since quitting: 13.5   Smokeless tobacco: Never Used  Substance Use Topics   Alcohol use: Yes    Comment: occasionally   Drug use: No     Family Hx: The patient's family history includes Diabetes in her father, maternal aunt, maternal grandfather, maternal  grandmother, maternal uncle, mother, and sister; Heart disease in her father and mother.  ROS:   Please see the history of present illness.    Review of Systems  Constitutional: Negative.   Respiratory: Negative.   Cardiovascular: Negative.   Gastrointestinal: Negative.   Skin: Positive for itching.  Neurological: Positive for tingling.  Endo/Heme/Allergies:       She c/o generalized itching. Unable to identify triggers. Scratching the area does not relieve her sx.   Psychiatric/Behavioral: Negative.     All other systems reviewed and are negative.   Labs/Other Tests and Data Reviewed:    Recent Labs: 04/09/2018: Platelets 226 08/04/2018: ALT 35; BUN 48; Creatinine, Ser 3.03; Potassium 4.6; Sodium 136 09/16/2018: TSH 1.270 10/26/2018: Hemoglobin 11.6   Recent Lipid Panel Lab Results  Component Value Date/Time   CHOL 156 09/16/2018 01:00 PM   TRIG 84 09/16/2018 01:00 PM   HDL 50 09/16/2018 01:00 PM   CHOLHDL 3.1 09/16/2018 01:00 PM   LDLCALC 89 09/16/2018 01:00 PM    Wt Readings from Last 3 Encounters:  09/16/18 182 lb 6.4 oz (82.7 kg)  09/16/18 182 lb 6.4 oz (82.7 kg)  08/24/18 180 lb 9.6 oz (81.9 kg)     Exam:    Vital Signs:  Ht 5' 4.4" (1.636 m)    BMI 30.92 kg/m     Physical Exam  Constitutional: She is oriented to person, place, and time and well-developed, well-nourished, and in no distress.  HENT:  Head: Normocephalic and atraumatic.  Pulmonary/Chest: Effort normal.  Neurological: She is alert and oriented to person, place, and time.  Skin: No rash noted.  Psychiatric: Affect normal.  Nursing note and vitals reviewed.   ASSESSMENT & PLAN:     1. Dysesthesia of multiple sites  Pt advised that I am not sure what could be causing her symptoms. She reports her sx are intermittent. She is encouraged to pay attention to possible triggers. She is also encouraged to pay close attention to determine if some areas of her body are more affected than others.    2. Pruritus  She is advised to try loratadine 90m daily as needed.  She will call me Monday to let me know if her sx persist. I will consider food allergy panel should her sx persist. She is in agreement with her treatment plan.    3. Class 1 obesity due to excess calories with serious comorbidity and body mass index (BMI) of 30.0 to 30.9 in adult  She admits she has not been exercising. She is encouraged to incorporate more exercise into her daily routine. She is advised to walk 30 minutes four to five days weekly.     COVID-19 Education: The signs and symptoms of COVID-19 were discussed with the patient and how to seek care for testing (follow up with PCP or  arrange E-visit).  The importance of social distancing was discussed today.  Patient Risk:   After full review of this patients clinical status, I feel that they are at least moderate risk at this time.  Time:   Today, I have spent 19 minutes/ 32 seconds with the patient with telehealth technology discussing above diagnoses.  THIS IS A FAILED VIRTUAL VISIT, SHE WAS ABLE TO CONNECT INITIALLY FOR 3 MINUTES, BUT THEN SHE LOST AUDIO. SHE WAS UNABLE TO TURN AUDIO BACK ON, SO I CALLED HER FOR THE REMAINDER OF THE VISIT.    Medication Adjustments/Labs and Tests Ordered: Current medicines are reviewed at length with the patient today.  Concerns regarding medicines are outlined above.   Tests Ordered: No orders of the defined types were placed in this encounter.   Medication Changes: Meds ordered this encounter  Medications   loratadine (CLARITIN) 10 MG tablet    Sig: Take 1 tablet (10 mg total) by mouth daily.    Dispense:  30 tablet    Refill:  2    Disposition:  Follow up prn  Signed, [name], MD

## 2018-10-28 NOTE — Patient Instructions (Signed)
Allergies, Adult An allergy means that your body reacts to something that bothers it (allergen). It is not a normal reaction. This can happen from something that you:  Eat.  Breathe in.  Touch. You can have an allergy (be allergic) to:  Outdoor things, like: ? Pollen. ? Grass. ? Weeds.  Indoor things, like: ? Dust. ? Smoke. ? Pet dander.  Foods.  Medicines.  Things that bother your skin, like: ? Detergents. ? Chemicals. ? Latex.  Perfume.  Bugs. An allergy cannot spread from person to person (is not contagious). Follow these instructions at home:         Stay away from things that you know you are allergic to.  If you have allergies to things in the air, wash out your nose each day. Do it with one of these: ? A salt-water (saline) spray. ? A container (neti pot).  Take over-the-counter and prescription medicines only as told by your doctor.  Keep all follow-up visits as told by your doctor. This is important.  If you are at risk for a very bad allergy reaction (anaphylaxis), keep an auto-injector with you all the time. This is called an epinephrine injection. ? This is pre-measured medicine with a needle. You can put it into your skin by yourself. ? Right after you have a very bad allergy reaction, you or a person with you must give the medicine in less than a few minutes. This is an emergency.  If you have ever had a very bad allergy reaction, wear a medical alert bracelet or necklace. Your very bad allergy should be written on it. Contact a health care provider if:  Your symptoms do not get better with treatment. Get help right away if:  You have symptoms of a very bad allergy reaction. These include: ? A swollen mouth, tongue, or throat. ? Pain or tightness in your chest. ? Trouble breathing. ? Being short of breath. ? Dizziness. ? Fainting. ? Very bad pain in your belly (abdomen). ? Throwing up (vomiting). ? Watery poop (diarrhea). Summary   An allergy means that your body reacts to something that bothers it (allergen). It is not a normal reaction.  Stay away from things that make your body react.  Take over-the-counter and prescription medicines only as told by your doctor.  If you are at risk for a very bad allergy reaction, carry an auto-injector (epinephrine injection) all the time. Also, wear a medical alert bracelet or necklace so people know about your allergy. This information is not intended to replace advice given to you by your health care provider. Make sure you discuss any questions you have with your health care provider. Document Released: 10/18/2012 Document Revised: 10/06/2016 Document Reviewed: 10/06/2016 Elsevier Interactive Patient Education  2019 [employer].

## 2018-11-09 ENCOUNTER — Ambulatory Visit (HOSPITAL_COMMUNITY)
Admission: RE | Admit: 2018-11-09 | Discharge: 2018-11-09 | Disposition: A | Discharge status: Home / Self Care | Payer: Medicare Other | Source: Ambulatory Visit | Attending: Nephrology | Admitting: Nephrology

## 2018-11-09 ENCOUNTER — Other Ambulatory Visit: Payer: Self-pay

## 2018-11-09 ENCOUNTER — Ambulatory Visit: Payer: Medicare Other | Admitting: Podiatry

## 2018-11-09 VITALS — BP 132/69 | HR 87 | Temp 96.6°F | Resp 20

## 2018-11-09 DIAGNOSIS — N184 Chronic kidney disease, stage 4 (severe): Secondary | ICD-10-CM | POA: Diagnosis not present

## 2018-11-09 DIAGNOSIS — E1122 Type 2 diabetes mellitus with diabetic chronic kidney disease: Secondary | ICD-10-CM | POA: Diagnosis not present

## 2018-11-09 LAB — POCT HEMOGLOBIN-HEMACUE: Hemoglobin: 11.2 g/dL — ABNORMAL LOW (ref 12.0–15.0)

## 2018-11-09 MED ORDER — EPOETIN ALFA 20000 UNIT/ML IJ SOLN
INTRAMUSCULAR | Status: AC
  Administered 2018-11-09: 20000 [IU] via SUBCUTANEOUS
  Filled 2018-11-09: qty 1

## 2018-11-09 MED ORDER — EPOETIN ALFA 10000 UNIT/ML IJ SOLN: 30000.0000 [IU] | INTRAMUSCULAR | Status: DC

## 2018-11-09 MED ORDER — EPOETIN ALFA 10000 UNIT/ML IJ SOLN
INTRAMUSCULAR | Status: AC
  Administered 2018-11-09: 10000 [IU] via SUBCUTANEOUS
  Filled 2018-11-09: qty 1

## 2018-11-23 ENCOUNTER — Other Ambulatory Visit: Payer: Self-pay

## 2018-11-23 ENCOUNTER — Encounter (HOSPITAL_COMMUNITY)
Admission: RE | Admit: 2018-11-23 | Discharge: 2018-11-23 | Disposition: A | Discharge status: Home / Self Care | Payer: Medicare Other | Source: Ambulatory Visit | Attending: Nephrology | Admitting: Nephrology

## 2018-11-23 VITALS — BP 116/72 | HR 96 | Temp 95.2°F | Resp 20

## 2018-11-23 DIAGNOSIS — N184 Chronic kidney disease, stage 4 (severe): Secondary | ICD-10-CM | POA: Insufficient documentation

## 2018-11-23 DIAGNOSIS — E1122 Type 2 diabetes mellitus with diabetic chronic kidney disease: Secondary | ICD-10-CM | POA: Diagnosis not present

## 2018-11-23 LAB — FERRITIN: Ferritin: 93 ng/mL (ref 11–307)

## 2018-11-23 LAB — IRON AND TIBC
Iron: 77 ug/dL (ref 28–170)
Saturation Ratios: 27 % (ref 10.4–31.8)
TIBC: 283 ug/dL (ref 250–450)
UIBC: 206 ug/dL

## 2018-11-23 LAB — POCT HEMOGLOBIN-HEMACUE: Hemoglobin: 11.4 g/dL — ABNORMAL LOW (ref 12.0–15.0)

## 2018-11-23 MED ORDER — EPOETIN ALFA 40000 UNIT/ML IJ SOLN
30000.0000 [IU] | INTRAMUSCULAR | Status: DC
  Administered 2018-11-23: 30000 [IU] via SUBCUTANEOUS

## 2018-11-23 MED ORDER — EPOETIN ALFA 20000 UNIT/ML IJ SOLN
INTRAMUSCULAR | Status: AC
  Filled 2018-11-23: qty 1

## 2018-11-23 MED ORDER — EPOETIN ALFA 10000 UNIT/ML IJ SOLN
INTRAMUSCULAR | Status: AC
  Filled 2018-11-23: qty 1

## 2018-11-24 MED FILL — Epoetin Alfa Inj 10000 Unit/ML: INTRAMUSCULAR | Qty: 1 | Status: AC

## 2018-11-24 MED FILL — Epoetin Alfa Inj 20000 Unit/ML: INTRAMUSCULAR | Qty: 1 | Status: AC

## 2018-12-06 ENCOUNTER — Other Ambulatory Visit (HOSPITAL_COMMUNITY): Payer: Self-pay | Admitting: *Deleted

## 2018-12-06 DIAGNOSIS — I129 Hypertensive chronic kidney disease with stage 1 through stage 4 chronic kidney disease, or unspecified chronic kidney disease: Secondary | ICD-10-CM | POA: Diagnosis not present

## 2018-12-06 DIAGNOSIS — E1122 Type 2 diabetes mellitus with diabetic chronic kidney disease: Secondary | ICD-10-CM | POA: Diagnosis not present

## 2018-12-06 DIAGNOSIS — D631 Anemia in chronic kidney disease: Secondary | ICD-10-CM | POA: Diagnosis not present

## 2018-12-06 DIAGNOSIS — N2581 Secondary hyperparathyroidism of renal origin: Secondary | ICD-10-CM | POA: Diagnosis not present

## 2018-12-06 DIAGNOSIS — N184 Chronic kidney disease, stage 4 (severe): Secondary | ICD-10-CM | POA: Diagnosis not present

## 2018-12-07 ENCOUNTER — Other Ambulatory Visit: Payer: Self-pay

## 2018-12-07 ENCOUNTER — Ambulatory Visit (HOSPITAL_COMMUNITY)
Admission: RE | Admit: 2018-12-07 | Discharge: 2018-12-07 | Disposition: A | Discharge status: Home / Self Care | Payer: Medicare Other | Source: Ambulatory Visit | Attending: Nephrology | Admitting: Nephrology

## 2018-12-07 VITALS — BP 131/67 | HR 73 | Temp 98.6°F | Resp 20 | Ht 64.5 in | Wt 183.2 lb

## 2018-12-07 DIAGNOSIS — E1122 Type 2 diabetes mellitus with diabetic chronic kidney disease: Secondary | ICD-10-CM | POA: Diagnosis not present

## 2018-12-07 DIAGNOSIS — N184 Chronic kidney disease, stage 4 (severe): Secondary | ICD-10-CM | POA: Insufficient documentation

## 2018-12-07 LAB — RENAL FUNCTION PANEL
Albumin: 3.3 g/dL — ABNORMAL LOW (ref 3.5–5.0)
Anion gap: 8 (ref 5–15)
BUN: 30 mg/dL — ABNORMAL HIGH (ref 8–23)
CO2: 22 mmol/L (ref 22–32)
Calcium: 9.2 mg/dL (ref 8.9–10.3)
Chloride: 111 mmol/L (ref 98–111)
Creatinine, Ser: 2.24 mg/dL — ABNORMAL HIGH (ref 0.44–1.00)
GFR calc Af Amer: 25 mL/min — ABNORMAL LOW (ref >60)
GFR calc non Af Amer: 21 mL/min — ABNORMAL LOW (ref >60)
Glucose, Bld: 101 mg/dL — ABNORMAL HIGH (ref 70–99)
Phosphorus: 4.1 mg/dL (ref 2.5–4.6)
Potassium: 3.8 mmol/L (ref 3.5–5.1)
Sodium: 141 mmol/L (ref 135–145)

## 2018-12-07 LAB — POCT HEMOGLOBIN-HEMACUE: Hemoglobin: 10.7 g/dL — ABNORMAL LOW (ref 12.0–15.0)

## 2018-12-07 MED ORDER — EPOETIN ALFA 40000 UNIT/ML IJ SOLN: 30000.0000 [IU] | INTRAMUSCULAR | Status: DC

## 2018-12-07 MED ORDER — SODIUM CHLORIDE 0.9 % IV SOLN
510.0000 mg | Freq: Once | INTRAVENOUS | Status: AC
  Administered 2018-12-07: 510 mg via INTRAVENOUS
  Filled 2018-12-07: qty 510

## 2018-12-07 MED ORDER — EPOETIN ALFA 20000 UNIT/ML IJ SOLN
INTRAMUSCULAR | Status: AC
  Administered 2018-12-07: 20000 [IU] via SUBCUTANEOUS
  Filled 2018-12-07: qty 1

## 2018-12-07 MED ORDER — EPOETIN ALFA 10000 UNIT/ML IJ SOLN
INTRAMUSCULAR | Status: AC
  Administered 2018-12-07: 10000 [IU] via SUBCUTANEOUS
  Filled 2018-12-07: qty 1

## 2018-12-08 LAB — PTH, INTACT AND CALCIUM
Calcium, Total (PTH): 8.9 mg/dL (ref 8.7–10.3)
PTH: 34 pg/mL (ref 15–65)

## 2018-12-15 ENCOUNTER — Ambulatory Visit: Payer: Medicare Other | Admitting: Podiatry

## 2018-12-15 ENCOUNTER — Other Ambulatory Visit: Payer: Self-pay

## 2018-12-15 ENCOUNTER — Encounter: Payer: Self-pay | Admitting: Podiatry

## 2018-12-15 VITALS — Temp 96.4°F

## 2018-12-15 DIAGNOSIS — M810 Age-related osteoporosis without current pathological fracture: Secondary | ICD-10-CM

## 2018-12-15 DIAGNOSIS — N952 Postmenopausal atrophic vaginitis: Secondary | ICD-10-CM

## 2018-12-15 DIAGNOSIS — N941 Unspecified dyspareunia: Secondary | ICD-10-CM | POA: Insufficient documentation

## 2018-12-15 DIAGNOSIS — D259 Leiomyoma of uterus, unspecified: Secondary | ICD-10-CM

## 2018-12-15 DIAGNOSIS — B351 Tinea unguium: Secondary | ICD-10-CM | POA: Diagnosis not present

## 2018-12-15 DIAGNOSIS — E119 Type 2 diabetes mellitus without complications: Secondary | ICD-10-CM

## 2018-12-15 DIAGNOSIS — E663 Overweight: Secondary | ICD-10-CM

## 2018-12-15 DIAGNOSIS — M79674 Pain in right toe(s): Secondary | ICD-10-CM

## 2018-12-15 DIAGNOSIS — L84 Corns and callosities: Secondary | ICD-10-CM | POA: Diagnosis not present

## 2018-12-15 DIAGNOSIS — R87610 Atypical squamous cells of undetermined significance on cytologic smear of cervix (ASC-US): Secondary | ICD-10-CM | POA: Insufficient documentation

## 2018-12-15 DIAGNOSIS — R87612 Low grade squamous intraepithelial lesion on cytologic smear of cervix (LGSIL): Secondary | ICD-10-CM | POA: Insufficient documentation

## 2018-12-15 DIAGNOSIS — N184 Chronic kidney disease, stage 4 (severe): Secondary | ICD-10-CM | POA: Diagnosis not present

## 2018-12-15 DIAGNOSIS — M79675 Pain in left toe(s): Secondary | ICD-10-CM

## 2018-12-15 DIAGNOSIS — E1122 Type 2 diabetes mellitus with diabetic chronic kidney disease: Secondary | ICD-10-CM | POA: Diagnosis not present

## 2018-12-15 HISTORY — DX: Low grade squamous intraepithelial lesion on cytologic smear of cervix (LGSIL): R87.612

## 2018-12-15 HISTORY — DX: Leiomyoma of uterus, unspecified: D25.9

## 2018-12-15 HISTORY — DX: Overweight: E66.3

## 2018-12-15 HISTORY — DX: Atypical squamous cells of undetermined significance on cytologic smear of cervix (ASC-US): R87.610

## 2018-12-15 HISTORY — DX: Postmenopausal atrophic vaginitis: N95.2

## 2018-12-15 HISTORY — DX: Age-related osteoporosis without current pathological fracture: M81.0

## 2018-12-15 NOTE — Patient Instructions (Signed)
Diabetes Mellitus and Foot Care Foot care is an important part of your health, especially when you have diabetes. Diabetes may cause you to have problems because of poor blood flow (circulation) to your feet and legs, which can cause your skin to:  Become thinner and drier.  Break more easily.  Heal more slowly.  Peel and crack. You may also have nerve damage (neuropathy) in your legs and feet, causing decreased feeling in them. This means that you may not notice minor injuries to your feet that could lead to more serious problems. Noticing and addressing any potential problems early is the best way to prevent future foot problems. How to care for your feet Foot hygiene  Wash your feet daily with warm water and mild soap. Do not use hot water. Then, pat your feet and the areas between your toes until they are completely dry. Do not soak your feet as this can dry your skin.  Trim your toenails straight across. Do not dig under them or around the cuticle. File the edges of your nails with an emery board or nail file.  Apply a moisturizing lotion or petroleum jelly to the skin on your feet and to dry, brittle toenails. Use lotion that does not contain alcohol and is unscented. Do not apply lotion between your toes. Shoes and socks  Wear clean socks or stockings every day. Make sure they are not too tight. Do not wear knee-high stockings since they may decrease blood flow to your legs.  Wear shoes that fit properly and have enough cushioning. Always look in your shoes before you put them on to be sure there are no objects inside.  To break in new shoes, wear them for just a few hours a day. This prevents injuries on your feet. Wounds, scrapes, corns, and calluses  Check your feet daily for blisters, cuts, bruises, sores, and redness. If you cannot see the bottom of your feet, use a mirror or ask someone for help.  Do not cut corns or calluses or try to remove them with medicine.  If you  find a minor scrape, cut, or break in the skin on your feet, keep it and the skin around it clean and dry. You may clean these areas with mild soap and water. Do not clean the area with peroxide, alcohol, or iodine.  If you have a wound, scrape, corn, or callus on your foot, look at it several times a day to make sure it is healing and not infected. Check for: ? Redness, swelling, or pain. ? Fluid or blood. ? Warmth. ? Pus or a bad smell. General instructions  Do not cross your legs. This may decrease blood flow to your feet.  Do not use heating pads or hot water bottles on your feet. They may burn your skin. If you have lost feeling in your feet or legs, you may not know this is happening until it is too late.  Protect your feet from hot and cold by wearing shoes, such as at the beach or on hot pavement.  Schedule a complete foot exam at least once a year (annually) or more often if you have foot problems. If you have foot problems, report any cuts, sores, or bruises to your health care provider immediately. Contact a health care provider if:  You have a medical condition that increases your risk of infection and you have any cuts, sores, or bruises on your feet.  You have an injury that is not   healing.  You have redness on your legs or feet.  You feel burning or tingling in your legs or feet.  You have pain or cramps in your legs and feet.  Your legs or feet are numb.  Your feet always feel cold.  You have pain around a toenail. Get help right away if:  You have a wound, scrape, corn, or callus on your foot and: ? You have pain, swelling, or redness that gets worse. ? You have fluid or blood coming from the wound, scrape, corn, or callus. ? Your wound, scrape, corn, or callus feels warm to the touch. ? You have pus or a bad smell coming from the wound, scrape, corn, or callus. ? You have a fever. ? You have a red line going up your leg. Summary  Check your feet every day  for cuts, sores, red spots, swelling, and blisters.  Moisturize feet and legs daily.  Wear shoes that fit properly and have enough cushioning.  If you have foot problems, report any cuts, sores, or bruises to your health care provider immediately.  Schedule a complete foot exam at least once a year (annually) or more often if you have foot problems. This information is not intended to replace advice given to you by your health care provider. Make sure you discuss any questions you have with your health care provider. Document Released: 06/20/2000 Document Revised: 08/05/2017 Document Reviewed: 07/25/2016 Elsevier Interactive Patient Education  2019 [employer].

## 2018-12-21 ENCOUNTER — Other Ambulatory Visit: Payer: Self-pay

## 2018-12-21 ENCOUNTER — Ambulatory Visit (HOSPITAL_COMMUNITY)
Admission: RE | Admit: 2018-12-21 | Discharge: 2018-12-21 | Disposition: A | Discharge status: Home / Self Care | Payer: Medicare Other | Source: Ambulatory Visit | Attending: Nephrology | Admitting: Nephrology

## 2018-12-21 VITALS — BP 120/69 | HR 83 | Temp 96.4°F | Resp 20

## 2018-12-21 DIAGNOSIS — E1122 Type 2 diabetes mellitus with diabetic chronic kidney disease: Secondary | ICD-10-CM | POA: Diagnosis not present

## 2018-12-21 DIAGNOSIS — N184 Chronic kidney disease, stage 4 (severe): Secondary | ICD-10-CM | POA: Insufficient documentation

## 2018-12-21 LAB — POCT HEMOGLOBIN-HEMACUE: Hemoglobin: 11 g/dL — ABNORMAL LOW (ref 12.0–15.0)

## 2018-12-21 MED ORDER — EPOETIN ALFA 10000 UNIT/ML IJ SOLN
INTRAMUSCULAR | Status: AC
  Administered 2018-12-21: 10000 [IU] via SUBCUTANEOUS
  Filled 2018-12-21: qty 1

## 2018-12-21 MED ORDER — EPOETIN ALFA 40000 UNIT/ML IJ SOLN: 30000.0000 [IU] | INTRAMUSCULAR | Status: DC

## 2018-12-21 MED ORDER — EPOETIN ALFA 20000 UNIT/ML IJ SOLN
INTRAMUSCULAR | Status: AC
  Administered 2018-12-21: 20000 [IU] via SUBCUTANEOUS
  Filled 2018-12-21: qty 1

## 2018-12-23 ENCOUNTER — Other Ambulatory Visit: Payer: Self-pay | Admitting: Obstetrics and Gynecology

## 2018-12-23 DIAGNOSIS — Z1231 Encounter for screening mammogram for malignant neoplasm of breast: Secondary | ICD-10-CM

## 2018-12-24 ENCOUNTER — Ambulatory Visit: Payer: Self-pay

## 2018-12-24 NOTE — Telephone Encounter (Signed)
Pt called for information on how to get COVID-19 testing. NT answered all questions. Pt will contact her PCP for testing orders. Answer Assessment - Initial Assessment Questions 1. REASON FOR CALL or QUESTION: "What is your reason for calling today?" or "How can I best help you?" or "What question do you have that I can help answer?"     Pt inquiring about COVID-19 testing.  Protocols used: INFORMATION ONLY CALL-A-AH

## 2018-12-28 ENCOUNTER — Encounter: Payer: Self-pay | Admitting: Emergency Medicine

## 2018-12-28 ENCOUNTER — Ambulatory Visit: Payer: Medicare Other | Admitting: Emergency Medicine

## 2018-12-28 ENCOUNTER — Other Ambulatory Visit: Payer: Self-pay

## 2018-12-28 VITALS — BP 128/84 | HR 85 | Ht 64.0 in | Wt 185.0 lb

## 2018-12-28 DIAGNOSIS — J479 Bronchiectasis, uncomplicated: Secondary | ICD-10-CM

## 2018-12-28 DIAGNOSIS — R9389 Abnormal findings on diagnostic imaging of other specified body structures: Secondary | ICD-10-CM

## 2018-12-28 HISTORY — DX: Abnormal findings on diagnostic imaging of other specified body structures: R93.89

## 2018-12-28 LAB — CBC WITH DIFFERENTIAL/PLATELET
Basophils Absolute: 0 10*3/uL (ref 0.0–0.1)
Basophils Relative: 0.4 % (ref 0.0–3.0)
Eosinophils Absolute: 0.2 10*3/uL (ref 0.0–0.7)
Eosinophils Relative: 1.8 % (ref 0.0–5.0)
HCT: 35.7 % — ABNORMAL LOW (ref 36.0–46.0)
Hemoglobin: 11.1 g/dL — ABNORMAL LOW (ref 12.0–15.0)
Lymphocytes Relative: 23 % (ref 12.0–46.0)
Lymphs Abs: 2 10*3/uL (ref 0.7–4.0)
MCHC: 31.1 g/dL (ref 30.0–36.0)
MCV: 74.9 fl — ABNORMAL LOW (ref 78.0–100.0)
Monocytes Absolute: 0.6 10*3/uL (ref 0.1–1.0)
Monocytes Relative: 7 % (ref 3.0–12.0)
Neutro Abs: 5.8 10*3/uL (ref 1.4–7.7)
Neutrophils Relative %: 67.8 % (ref 43.0–77.0)
Platelets: 203 10*3/uL (ref 150.0–400.0)
RBC: 4.76 Mil/uL (ref 3.87–5.11)
RDW: 19.6 % — ABNORMAL HIGH (ref 11.5–15.5)
WBC: 8.6 10*3/uL (ref 4.0–10.5)

## 2018-12-28 NOTE — Assessment & Plan Note (Addendum)
Bronchiectasis of unclear cause.  I do not know of any correlation between sickle cell trait, beta thalassemia trait and bronchiectasis, likely unrelated.  She does not have significant mucus production, frequent bronchitic symptoms.  As such I suspect that this is localized injury due to either a prior bronchitis or pneumonia.  All the same we will rule out common causes such as immunoglobulin deficiency, CFTR variant genotype.  She does not make much sputum so not sure that we will be able to obtain AFB cultures but we will try.  Her pulmonary nodules are small and she does not have a significant tobacco history which makes her low risk for malignancy.  I will plan a follow-up the bronchiectatic change in the small associated nodules with a CT scan at the one-year mark.  She will follow-up with me sooner if she develops clinical symptoms.  We will plan to repeat your CT scan of the chest without contrast in March 2021 to follow bronchiectasis and small pulmonary nodule. We will perform lab work today. We will try to obtain a sputum sample to send for culture information Follow with Dr. Lamonte Sakai in 9 months to review your CT chest together  Please call our office if you experience any increasing cough, mucus production, chest symptoms.  If so that we would like to see you sooner

## 2018-12-28 NOTE — Progress Notes (Signed)
Subjective:    Patient ID: Miranda Roman, female    DOB: 04-29-1946, 73 y.o.   MRN: [id number]  HPI    Review of Systems  Constitutional: Negative.  Negative for fever and unexpected weight change.  HENT: Positive for congestion. Negative for dental problem, ear pain, nosebleeds, postnasal drip, rhinorrhea, sinus pressure, sneezing, sore throat and trouble swallowing.   Eyes: Negative.  Negative for redness and itching.  Respiratory: Negative.  Negative for cough, chest tightness, shortness of breath and wheezing.   Cardiovascular: Negative.  Negative for palpitations and leg swelling.  Gastrointestinal: Negative.  Negative for nausea and vomiting.  Genitourinary: Negative.  Negative for dysuria.  Musculoskeletal: Negative.  Negative for joint swelling.  Skin: Negative.  Negative for rash.  Allergic/Immunologic: Positive for environmental allergies. Negative for food allergies and immunocompromised state.  Neurological: Negative.  Negative for headaches.  Hematological: Does not bruise/bleed easily.  Psychiatric/Behavioral: Negative.  Negative for dysphoric mood. The patient is not nervous/anxious.        Objective:   Physical Exam        Assessment & Plan:

## 2018-12-28 NOTE — Progress Notes (Signed)
Subjective:    Patient ID: Miranda Roman, female    DOB: Jun 13, 1946, 73 y.o.   MRN: [id number]  HPI 73 year old former smoker (10 pack years) with history of diabetes, hyperlipidemia, hypertension and sickle cell and b-thalasemia trait. .  She is referred today for evaluation of abnormal CT scan of the chest that identified bronchiectasis and small pulmonary nodule. The scan was done because she was dealing with persistent cough x 6 weeks, was non-productive. She rarely produces mucous now, but had frequent bronchitis when she was a smoker. She had PNA as a child. Denies any current sx. She does occasionally have UA cough that she relates to seasonal rhinitis.   CT scan chest done on 09/15/2018 was reviewed by me, shows scattered mild cylindrical bronchiectatic change especially in the right middle lobe with some associated bronchial distortion.  There is an adjacent 2 mm pulmonary nodule   Review of Systems  Constitutional: Negative.   HENT: Positive for congestion, postnasal drip and rhinorrhea. Negative for sinus pressure, sinus pain, sneezing and sore throat.   Respiratory: Negative.   Cardiovascular: Negative.   Gastrointestinal: Negative.   Musculoskeletal: Negative.   Neurological: Negative.     Past Medical History:  Diagnosis Date  . Anemia   . Diabetes mellitus   . Hyperlipidemia   . Hypertension   . Osteoporosis   . Sickle cell anemia (HCC)      Family History  Problem Relation Age of Onset  . Diabetes Mother   . Heart disease Mother   . Diabetes Father   . Heart disease Father   . Diabetes Sister   . Diabetes Maternal Aunt   . Diabetes Maternal Uncle   . Diabetes Maternal Grandmother   . Diabetes Maternal Grandfather      Social History   Socioeconomic History  . Marital status: Widowed    Spouse name: Not on file  . Number of children: Not on file  . Years of education: Not on file  . Highest education level: Not on file  Occupational History  .  Occupation: retired  Scientific laboratory technician  . Financial resource strain: Not hard at all  . Food insecurity    Worry: Never true    Inability: Never true  . Transportation needs    Medical: No    Non-medical: No  Tobacco Use  . Smoking status: Former Smoker    Packs/day: 0.25    Years: 40.00    Pack years: 10.00    Types: Cigarettes    Quit date: 04/2005    Years since quitting: 13.7  . Smokeless tobacco: Never Used  Substance and Sexual Activity  . Alcohol use: Yes    Comment: occasionally  . Drug use: No  . Sexual activity: Yes  Lifestyle  . Physical activity    Days per week: 0 days    Minutes per session: 0 min  . Stress: Not at all  Relationships  . Social Herbalist on phone: Not on file    Gets together: Not on file    Attends religious service: Not on file    Active member of club or organization: Not on file    Attends meetings of clubs or organizations: Not on file    Relationship status: Not on file  . Intimate partner violence    Fear of current or ex partner: No    Emotionally abused: No    Physically abused: No    Forced sexual activity: No  Other Topics Concern  . Not on file  Social History Narrative  . Not on file     No Known Allergies   Outpatient Medications Prior to Visit  Medication Sig Dispense Refill  . aspirin 81 MG tablet Take 81 mg by mouth daily.    [name] atorvastatin (LIPITOR) 40 MG tablet Take 40 mg by mouth daily.    . calcitRIOL (ROCALTROL) 0.25 MCG capsule     . calcitRIOL (ROCALTROL) 0.5 MCG capsule Take 0.5 mcg by mouth daily. 2 tabs 1 day and 3 tabs the next day alternating 2 tabs and 3 tabs    . dexlansoprazole (DEXILANT) 60 MG capsule Take 1 capsule (60 mg total) by mouth daily. 30 capsule 0  . epoetin alfa (EPOGEN,PROCRIT) 81157 UNIT/ML injection 10,000 Units once. Takes every 2 weeks    . glucose blood (ONETOUCH VERIO) test strip Use as instructed to check blood sugars 1 time per day e11.22 100 each 11  . Insulin Detemir  (LEVEMIR FLEXTOUCH) 100 UNIT/ML Pen Inject 16 Units into the skin daily. (Patient taking differently: Inject 18 Units into the skin daily. ) 15 pen 1  . insulin lispro (HUMALOG) 100 UNIT/ML injection Inject 4 Units into the skin 3 (three) times daily before meals.    [name] loratadine (CLARITIN) 10 MG tablet Take 1 tablet (10 mg total) by mouth daily. 30 tablet 2  . ONETOUCH DELICA LANCETS 26O MISC 1 each by Does not apply route daily. DX: E11.65 150 each 11  . quinapril-hydrochlorothiazide (ACCURETIC) 20-12.5 MG tablet Take 1/2 tablet by mouth daily 90 tablet 1  . benzonatate (TESSALON PERLES) 100 MG capsule Take 1 capsule (100 mg total) by mouth every 6 (six) hours as needed for cough. (Patient not taking: Reported on 12/28/2018) 30 capsule 1   No facility-administered medications prior to visit.         Objective:   Physical Exam Vitals:   12/28/18 1137  BP: 128/84  Pulse: 85  SpO2: 98%  Weight: 185 lb (83.9 kg)  Height: _0  (1.626 m)   Gen: Pleasant, well-nourished, in no distress,  normal affect  ENT: No lesions,  mouth clear,  oropharynx clear, no postnasal drip  Neck: No JVD, no stridor  Lungs: No use of accessory muscles, no crackles or wheezing on normal respiration, no wheeze on forced expiration, ? Slight coarse BS R base  Cardiovascular: Regular, soft holosystolic M, ? S3  Musculoskeletal: No deformities, no cyanosis or clubbing  Neuro: alert, awake, non focal  Skin: Warm, no lesions or rash      Assessment & Plan:  Abnormal CT of the chest Bronchiectasis of unclear cause.  I do not know of any correlation between sickle cell trait, beta thalassemia trait and bronchiectasis, likely unrelated.  She does not have significant mucus production, frequent bronchitic symptoms.  As such I suspect that this is localized injury due to either a prior bronchitis or pneumonia.  All the same we will rule out common causes such as immunoglobulin deficiency, CFTR variant genotype.   She does not make much sputum so not sure that we will be able to obtain AFB cultures but we will try.  Her pulmonary nodules are small and she does not have a significant tobacco history which makes her low risk for malignancy.  I will plan a follow-up the bronchiectatic change in the small associated nodules with a CT scan at the one-year mark.  She will follow-up with me sooner if she develops clinical symptoms.  We will plan to repeat your CT scan of the chest without contrast in March 2021 to follow bronchiectasis and small pulmonary nodule. We will perform lab work today. We will try to obtain a sputum sample to send for culture information Follow with Dr. Lamonte Sakai in 9 months to review your CT chest together  Please call our office if you experience any increasing cough, mucus production, chest symptoms.  If so that we would like to see you sooner  Baltazar Apo, MD, PhD 12/28/2018, 1:37 PM [facility] [phone] or if no answer [phone]

## 2018-12-28 NOTE — Patient Instructions (Signed)
We will plan to repeat your CT scan of the chest without contrast in March 2021 to follow bronchiectasis and small pulmonary nodule. We will perform lab work today. We will try to obtain a sputum sample to send for culture information Follow with Dr. Lamonte Sakai in 9 months to review your CT chest together  Please call our office if you experience any increasing cough, mucus production, chest symptoms.  If so that we would like to see you sooner

## 2018-12-29 LAB — IGE: IgE (Immunoglobulin E), Serum: 31 kU/L

## 2018-12-29 NOTE — Progress Notes (Signed)
Subjective: Miranda Roman presents today referred by Glendale Chard, MD for diabetic foot examination.   She presents with 25 year h/o diabetes. Denies any h/o foot wounds. States she has numbness in toes on occasion from prior foot surgery.   She presents with cc of painful, discolored, thick toenails and corns and calluses which interfere with daily activities.Duration is several months.She is afraid to attempt to treat them due to her being diabetic.  Pain is aggravated when wearing enclosed shoe gear.  Past Medical History:  Diagnosis Date  . Anemia   . Diabetes mellitus   . Hyperlipidemia   . Hypertension   . Osteoporosis   . Sickle cell anemia [facility])      Patient Active Problem List   Diagnosis Date Noted  . Abnormal CT of the chest 12/28/2018  . Atrophic vaginitis 12/15/2018  . Atypical squamous cells of undetermined significance on cytologic smear of cervix (ASC-US) 12/15/2018  . Overweight with body mass index (BMI) 25.0-29.9 12/15/2018  . 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 Osteoporosis 12/15/2018  . Unspecified dyspareunia (CODE) 12/15/2018  . 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 Gastroesophageal reflux disease 08/26/2016  . Aortic valve regurgitation 02/11/2016  . Chest pain at rest 01/30/2015  . Atherosclerosis of native coronary artery of native heart without angina pectoris 12/24/2012  . Essential hypertension 12/24/2012  . Hyperlipidemia 12/24/2012  . 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 03/10/2012  . Diabetes mellitus with stage 4 chronic kidney disease ([place]) 03/10/2012     Past Surgical History:  Procedure Laterality Date  . BONE RESECTION  01/2006   wrist  . 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 TUBAL LIGATION  04/28/1977      Current Outpatient Medications:  .  aspirin 81 MG tablet, Take 81 mg by mouth daily., Disp: , Rfl:  .  atorvastatin (LIPITOR) 40 MG  tablet, Take 40 mg by mouth daily., Disp: , Rfl:  .  benzonatate (TESSALON PERLES) 100 MG capsule, Take 1 capsule (100 mg total) by mouth every 6 (six) hours as needed for cough. (Patient not taking: Reported on 12/28/2018), Disp: 30 capsule, Rfl: 1 .  calcitRIOL (ROCALTROL) 0.25 MCG capsule, , Disp: , Rfl:  .  calcitRIOL (ROCALTROL) 0.5 MCG capsule, Take 0.5 mcg by mouth daily. 2 tabs 1 day and 3 tabs the next day alternating 2 tabs and 3 tabs, Disp: , Rfl:  .  dexlansoprazole (DEXILANT) 60 MG capsule, Take 1 capsule (60 mg total) by mouth daily., Disp: 30 capsule, Rfl: 0 .  epoetin alfa (EPOGEN,PROCRIT) 04540 UNIT/ML injection, 10,000 Units once. Takes every 2 weeks, Disp: , Rfl:  .  glucose blood (ONETOUCH VERIO) test strip, Use as instructed to check blood sugars 1 time per day e11.22, Disp: 100 each, Rfl: 11 .  Insulin Detemir (LEVEMIR FLEXTOUCH) 100 UNIT/ML Pen, Inject 16 Units into the skin daily. (Patient taking differently: Inject 18 Units into the skin daily. ), Disp: 15 pen, Rfl: 1 .  insulin lispro (HUMALOG) 100 UNIT/ML injection, Inject 4 Units into the skin 3 (three) times daily before meals., Disp: , Rfl:  .  loratadine (CLARITIN) 10 MG tablet, Take 1 tablet (10 mg total) by mouth daily., Disp: 30 tablet, Rfl: 2 .  ONETOUCH DELICA LANCETS 98J MISC, 1 each by Does not apply route daily. DX: E11.65, Disp: 150 each, Rfl: 11 .  quinapril-hydrochlorothiazide (ACCURETIC) 20-12.5 MG tablet, Take 1/2 tablet by mouth daily, Disp: 90  tablet, Rfl: 1   No Known Allergies   Social History   Occupational History  . Occupation: retired  Tobacco Use  . Smoking status: Former Smoker    Packs/day: 0.25    Years: 40.00    Pack years: 10.00    Types: Cigarettes    Quit date: 04/2005    Years since quitting: 13.7  . Smokeless tobacco: Never Used  Substance and Sexual Activity  . Alcohol use: Yes    Comment: occasionally  . Drug use: No  . Sexual activity: Yes     Family History  Problem  Relation Age of Onset  . Diabetes Mother   . Heart disease Mother   . Diabetes Father   . Heart disease Father   . Diabetes Sister   . Diabetes Maternal Aunt   . Diabetes Maternal Uncle   . Diabetes Maternal Grandmother   . Diabetes Maternal Grandfather       There is no immunization history on file for this patient.   Review of systems: Positive Findings in bold print.  Constitutional:  chills, fatigue, fever, sweats, weight change Communication: Optometrist, sign Ecologist, hand writing, iPad/Android device Head: headaches, head injury Eyes: changes in vision, eye pain, glaucoma, cataracts, macular degeneration, diplopia, glare,  light sensitivity, eyeglasses or contacts, blindness Ears nose mouth throat: hearing impaired, hearing aids,  ringing in ears, deaf, sign language,  vertigo,   nosebleeds,  rhinitis,  cold sores, snoring, swollen glands Cardiovascular: HTN, edema, arrhythmia, pacemaker in place, defibrillator in place, chest pain/tightness, chronic anticoagulation, blood clot, heart failure, MI Peripheral Vascular: leg cramps, varicose veins, blood clots, lymphedema, varicosities Respiratory:  difficulty breathing, denies congestion, SOB, wheezing, cough, emphysema Gastrointestinal: change in appetite or weight, abdominal pain, constipation, diarrhea, nausea, vomiting, vomiting blood, change in bowel habits, abdominal pain, jaundice, rectal bleeding, hemorrhoids, GERD Genitourinary:  nocturia,  pain on urination, polyuria,  blood in urine, Foley catheter, urinary urgency, ESRD on hemodialysis Musculoskeletal: amputation, cramping, stiff joints, painful joints, decreased joint motion, fractures, OA, gout, hemiplegia, paraplegia, uses cane, wheelchair bound, uses walker, uses rollator Skin: +changes in toenails, color change, dryness, itching, mole changes,  rash, wound(s) Neurological: headaches, numbness in feet, paresthesias in feet, burning in feet, fainting,   seizures, change in speech. denies headaches, memory problems/poor historian, cerebral palsy, weakness, paralysis, CVA, TIA Endocrine: diabetes, hypothyroidism, hyperthyroidism,  goiter, dry mouth, flushing, heat intolerance,  cold intolerance,  excessive thirst, denies polyuria,  nocturia Hematological:  easy bleeding, excessive bleeding, easy bruising, enlarged lymph nodes, on long term blood thinner, history of past transusions Allergy/immunological:  hives, eczema, frequent infections, multiple drug allergies, seasonal allergies, transplant recipient, multiple food allergies Psychiatric:  anxiety, depression, mood disorder, suicidal ideations, hallucinations, insomnia  Objective: Vitals:   12/15/18 1333  Temp: (!) 96.4 F (35.8 C)    Vascular Examination: Capillary refill time immediate x 10 digits.  Dorsalis pedis pulses palpable b/l.   Posterior tibial pulses palpable b/l.   No digital hair x 10 digits.  Skin temperature gradient WNL b/l.  Dermatological Examination: Skin with normal turgor, texture and tone b/l.  Toenails 1-5 b/l discolored, thick, dystrophic with subungual debris and pain with palpation to nailbeds due to thickness of nails.  Hyperkeratotic lesions distal tip left 3rd digit,  submet head 1 right, submet head 3 b/l, 4 b/l, 5 left foot and lateral 5th metatarsal head right with tenderness to palpation. No edema, no erythema, no drainage, no flocculence.  Musculoskeletal: Muscle strength 5/5 to all LE  muscle groups.  Hammertoes 3rd digit left foot  Neurological: Sensation intact 5/5 b/l with 10 gram monofilament.  Vibratory sensation intact b/l.  Proprioceptive sensation intact b/l.  Hemoglobin A1C Latest Ref Rng & Units 09/16/2018  HGBA1C 4.8 - 5.6 % 7.5(H)  Some recent data might be hidden    Assessment: 1. Encounter for diabetic foot exam today. 2. Painful onychomycosis toenails 1-5 b/l  3. Calluses submet head 1 right, submet head 3 b/l, 4  b/l, 5 left foot and lateral 5th metatarsal head right 4. Corns left 3rd digit 5. NIDDM with CKD stage 4  Plan: 1. Encounter for diabetic foot examination performed. Discussed diabetic foot care principles on today. Literature dispensed. 2.  Discussed onychomycosis and treatment options.  Literature dispensed on today. 3. Toenails 1-5 b/l were debrided in length and girth without iatrogenic bleeding. 4. Calluses pared submet head 1 right, submet head 3 b/l, 4 b/l, 5 left foot and lateral 5th metatarsal head right utilizing sterile scalpel blade without incident.  5. Corn(s) pared distal tip left 3rd digit utilizing sterile scalpel blade without incident. 6. Patient to continue soft, supportive shoe gear daily. 7. Patient to report any pedal injuries to medical professional immediately. 8. Follow up 3 months.  9. Patient/POA to call should there be a concern in the interim.

## 2019-01-04 ENCOUNTER — Other Ambulatory Visit: Payer: Self-pay

## 2019-01-04 ENCOUNTER — Encounter (HOSPITAL_COMMUNITY)
Admission: RE | Admit: 2019-01-04 | Discharge: 2019-01-04 | Disposition: A | Discharge status: Home / Self Care | Payer: Medicare Other | Source: Ambulatory Visit | Attending: Nephrology | Admitting: Nephrology

## 2019-01-04 VITALS — BP 143/71 | HR 78 | Temp 95.7°F | Resp 20

## 2019-01-04 DIAGNOSIS — E1122 Type 2 diabetes mellitus with diabetic chronic kidney disease: Secondary | ICD-10-CM | POA: Diagnosis not present

## 2019-01-04 DIAGNOSIS — N184 Chronic kidney disease, stage 4 (severe): Secondary | ICD-10-CM | POA: Diagnosis not present

## 2019-01-04 LAB — FERRITIN: Ferritin: 224 ng/mL (ref 11–307)

## 2019-01-04 LAB — IRON AND TIBC
Iron: 89 ug/dL (ref 28–170)
Saturation Ratios: 37 % — ABNORMAL HIGH (ref 10.4–31.8)
TIBC: 242 ug/dL — ABNORMAL LOW (ref 250–450)
UIBC: 153 ug/dL

## 2019-01-04 LAB — POCT HEMOGLOBIN-HEMACUE: Hemoglobin: 11 g/dL — ABNORMAL LOW (ref 12.0–15.0)

## 2019-01-04 MED ORDER — EPOETIN ALFA 10000 UNIT/ML IJ SOLN
INTRAMUSCULAR | Status: AC
  Administered 2019-01-04: 10000 [IU] via SUBCUTANEOUS
  Filled 2019-01-04: qty 1

## 2019-01-04 MED ORDER — EPOETIN ALFA 40000 UNIT/ML IJ SOLN: 30000.0000 [IU] | INTRAMUSCULAR | Status: DC

## 2019-01-04 MED ORDER — EPOETIN ALFA 20000 UNIT/ML IJ SOLN
INTRAMUSCULAR | Status: AC
  Administered 2019-01-04: 20000 [IU] via SUBCUTANEOUS
  Filled 2019-01-04: qty 1

## 2019-01-06 LAB — CYSTIC FIBROSIS GENE TEST

## 2019-01-12 ENCOUNTER — Institutional Professional Consult (permissible substitution): Payer: Medicare Other | Admitting: Pulmonary Disease

## 2019-01-14 DIAGNOSIS — N184 Chronic kidney disease, stage 4 (severe): Secondary | ICD-10-CM | POA: Diagnosis not present

## 2019-01-14 DIAGNOSIS — E119 Type 2 diabetes mellitus without complications: Secondary | ICD-10-CM | POA: Diagnosis not present

## 2019-01-14 DIAGNOSIS — I10 Essential (primary) hypertension: Secondary | ICD-10-CM | POA: Diagnosis not present

## 2019-01-14 DIAGNOSIS — Z794 Long term (current) use of insulin: Secondary | ICD-10-CM | POA: Diagnosis not present

## 2019-01-17 ENCOUNTER — Ambulatory Visit: Payer: Medicare Other | Admitting: Internal Medicine

## 2019-01-17 ENCOUNTER — Other Ambulatory Visit: Payer: Self-pay

## 2019-01-18 ENCOUNTER — Encounter (HOSPITAL_COMMUNITY)
Admission: RE | Admit: 2019-01-18 | Discharge: 2019-01-18 | Disposition: A | Discharge status: Home / Self Care | Payer: Medicare Other | Source: Ambulatory Visit | Attending: Nephrology | Admitting: Nephrology

## 2019-01-18 VITALS — BP 139/57 | HR 80 | Temp 96.8°F | Resp 20

## 2019-01-18 DIAGNOSIS — N184 Chronic kidney disease, stage 4 (severe): Secondary | ICD-10-CM | POA: Diagnosis not present

## 2019-01-18 DIAGNOSIS — E1122 Type 2 diabetes mellitus with diabetic chronic kidney disease: Secondary | ICD-10-CM | POA: Diagnosis not present

## 2019-01-18 LAB — POCT HEMOGLOBIN-HEMACUE: Hemoglobin: 10.8 g/dL — ABNORMAL LOW (ref 12.0–15.0)

## 2019-01-18 MED ORDER — EPOETIN ALFA 10000 UNIT/ML IJ SOLN
30000.0000 [IU] | INTRAMUSCULAR | Status: DC
  Administered 2019-01-18: 10000 [IU] via SUBCUTANEOUS

## 2019-01-18 MED ORDER — EPOETIN ALFA 10000 UNIT/ML IJ SOLN
INTRAMUSCULAR | Status: AC
  Filled 2019-01-18: qty 1

## 2019-01-18 MED ORDER — EPOETIN ALFA 20000 UNIT/ML IJ SOLN
INTRAMUSCULAR | Status: AC
  Administered 2019-01-18: 20000 [IU]
  Filled 2019-01-18: qty 1

## 2019-01-26 ENCOUNTER — Encounter: Payer: Self-pay | Admitting: Internal Medicine

## 2019-01-26 ENCOUNTER — Other Ambulatory Visit: Payer: Self-pay

## 2019-01-26 ENCOUNTER — Ambulatory Visit (INDEPENDENT_AMBULATORY_CARE_PROVIDER_SITE_OTHER): Payer: Medicare Other | Admitting: Internal Medicine

## 2019-01-26 VITALS — BP 118/66 | HR 91 | Temp 98.7°F | Ht 64.0 in | Wt 187.8 lb

## 2019-01-26 DIAGNOSIS — I129 Hypertensive chronic kidney disease with stage 1 through stage 4 chronic kidney disease, or unspecified chronic kidney disease: Secondary | ICD-10-CM

## 2019-01-26 DIAGNOSIS — E2839 Other primary ovarian failure: Secondary | ICD-10-CM

## 2019-01-26 DIAGNOSIS — E1122 Type 2 diabetes mellitus with diabetic chronic kidney disease: Secondary | ICD-10-CM | POA: Diagnosis not present

## 2019-01-26 DIAGNOSIS — N184 Chronic kidney disease, stage 4 (severe): Secondary | ICD-10-CM

## 2019-01-26 DIAGNOSIS — K862 Cyst of pancreas: Secondary | ICD-10-CM | POA: Diagnosis not present

## 2019-01-26 DIAGNOSIS — N2889 Other specified disorders of kidney and ureter: Secondary | ICD-10-CM

## 2019-01-26 MED ORDER — INSULIN LISPRO 100 UNIT/ML ~~LOC~~ SOLN: 4.0000 [IU] | Freq: Three times a day (TID) | SUBCUTANEOUS | 2 refills | Status: DC

## 2019-01-29 NOTE — Progress Notes (Signed)
Subjective:     Patient ID: Miranda Roman , female    DOB: 29-Jan-1946 , 73 y.o.   MRN: [id number]   Chief Complaint  Patient presents with  . Diabetes  . Hypertension    HPI  Diabetes She presents for her follow-up diabetic visit. She has type 2 diabetes mellitus. Her disease course has been stable. There are no hypoglycemic associated symptoms. There are no diabetic associated symptoms. Pertinent negatives for diabetes include no blurred vision and no chest pain. There are no hypoglycemic complications. Diabetic complications include nephropathy. Risk factors for coronary artery disease include diabetes mellitus, dyslipidemia, hypertension, sedentary lifestyle and post-menopausal. Current diabetic treatment includes insulin injections. She is compliant with treatment most of the time. She is following a diabetic diet. Meal planning includes avoidance of concentrated sweets.  Hypertension This is a chronic problem. The current episode started more than 1 year ago. The problem has been gradually improving since onset. Pertinent negatives include no blurred vision, chest pain, palpitations or shortness of breath. The current treatment provides moderate improvement.     Past Medical History:  Diagnosis Date  . Anemia   . Diabetes mellitus   . Hyperlipidemia   . Hypertension   . Osteoporosis   . Sickle cell anemia (HCC)      Family History  Problem Relation Age of Onset  . Diabetes Mother   . Heart disease Mother   . Diabetes Father   . Heart disease Father   . Diabetes Sister   . Diabetes Maternal Aunt   . Diabetes Maternal Uncle   . Diabetes Maternal Grandmother   . Diabetes Maternal Grandfather      Current Outpatient Medications:  .  aspirin 81 MG tablet, Take 81 mg by mouth daily., Disp: , Rfl:  .  atorvastatin (LIPITOR) 40 MG tablet, Take 40 mg by mouth daily., Disp: , Rfl:  .  calcitRIOL (ROCALTROL) 0.5 MCG capsule, Take 0.5 mcg by mouth daily. 2 tabs 1 day and 3  tabs the next day alternating 2 tabs and 3 tabs, Disp: , Rfl:  .  epoetin alfa (EPOGEN,PROCRIT) 40973 UNIT/ML injection, 10,000 Units once. Takes every 2 weeks, Disp: , Rfl:  .  glucose blood (ONETOUCH VERIO) test strip, Use as instructed to check blood sugars 1 time per day e11.22, Disp: 100 each, Rfl: 11 .  Insulin Detemir (LEVEMIR FLEXTOUCH) 100 UNIT/ML Pen, Inject 16 Units into the skin daily. (Patient taking differently: Inject 18 Units into the skin daily. 16 - 18 units), Disp: 15 pen, Rfl: 1 .  loratadine (CLARITIN) 10 MG tablet, Take 1 tablet (10 mg total) by mouth daily., Disp: 30 tablet, Rfl: 2 .  ONETOUCH DELICA LANCETS 53G MISC, 1 each by Does not apply route daily. DX: E11.65, Disp: 150 each, Rfl: 11 .  quinapril-hydrochlorothiazide (ACCURETIC) 20-12.5 MG tablet, Take 1/2 tablet by mouth daily, Disp: 90 tablet, Rfl: 1 .  benzonatate (TESSALON PERLES) 100 MG capsule, Take 1 capsule (100 mg total) by mouth every 6 (six) hours as needed for cough. (Patient not taking: Reported on 12/28/2018), Disp: 30 capsule, Rfl: 1 .  dexlansoprazole (DEXILANT) 60 MG capsule, Take 1 capsule (60 mg total) by mouth daily. (Patient not taking: Reported on 01/26/2019), Disp: 30 capsule, Rfl: 0 .  insulin lispro (HUMALOG) 100 UNIT/ML injection, Inject 0.04 mLs (4 Units total) into the skin 3 (three) times daily before meals. Sliding scale, Disp: 10 mL, Rfl: 2   No Known Allergies   Review of Systems  Constitutional: Negative.   Eyes: Negative for blurred vision.  Respiratory: Negative.  Negative for shortness of breath.   Cardiovascular: Negative.  Negative for chest pain and palpitations.  Gastrointestinal: Negative.   Neurological: Negative.   Psychiatric/Behavioral: Negative.      Today's Vitals   01/26/19 1041  BP: 118/66  Pulse: 91  Temp: 98.7 F (37.1 C)  TempSrc: Oral  Weight: 187 lb 12.8 oz (85.2 kg)  Height: _0  (1.626 m)  PainSc: 0-No pain   Body mass index is 32.24 kg/m.    Objective:  Physical Exam Vitals signs and nursing note reviewed.  Constitutional:      Appearance: Normal appearance.  HENT:     Head: Normocephalic and atraumatic.  Cardiovascular:     Rate and Rhythm: Normal rate and regular rhythm.     Heart sounds: Normal heart sounds.  Pulmonary:     Effort: Pulmonary effort is normal.     Breath sounds: Normal breath sounds.  Skin:    General: Skin is warm.  Neurological:     General: No focal deficit present.     Mental Status: She is alert.  Psychiatric:        Mood and Affect: Mood normal.        Behavior: Behavior normal.         Assessment And Plan:     1. 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 Importance of dietary compliance was discussed with the patient.   2. Hypertensive nephropathy  Well controlled. She will continue with current meds. She is encouraged to avoid adding salt to her foods.   3. Estrogen deficiency  I will refer her for bone density.  She is encouraged to engage in weight-bearing exercises three days weekly and advised to take calcium/vit d as per Renal.  - DG Bone Density; Future   4. Cyst of pancreas  Chronic. I will schedule f/u MRI in Sept as recommended by Radiology.  - MR ABDOMEN WO CONTRAST; Future  5. Other specified disorders of kidney and ureter  I will schedule f/u MRI in Sept as recommended by Radiology.   - MR ABDOMEN WO CONTRAST; Future        [name], MD    THE PATIENT IS ENCOURAGED TO PRACTICE SOCIAL DISTANCING DUE TO THE COVID-19 PANDEMIC.

## 2019-01-31 ENCOUNTER — Ambulatory Visit: Payer: Medicare Other

## 2019-01-31 ENCOUNTER — Other Ambulatory Visit: Payer: Self-pay

## 2019-01-31 VITALS — BP 108/66 | HR 78 | Temp 98.4°F | Ht 63.4 in | Wt 183.8 lb

## 2019-01-31 DIAGNOSIS — E1122 Type 2 diabetes mellitus with diabetic chronic kidney disease: Secondary | ICD-10-CM | POA: Diagnosis not present

## 2019-01-31 DIAGNOSIS — N184 Chronic kidney disease, stage 4 (severe): Secondary | ICD-10-CM

## 2019-01-31 DIAGNOSIS — I10 Essential (primary) hypertension: Secondary | ICD-10-CM

## 2019-01-31 NOTE — Progress Notes (Signed)
Patient came today to have a covid test done but later decided that she wanted antibodies testing which was performed by [facility] today in the office. Pt encouraged to call the office if she has any concerns

## 2019-02-01 ENCOUNTER — Ambulatory Visit (HOSPITAL_COMMUNITY)
Admission: RE | Admit: 2019-02-01 | Discharge: 2019-02-01 | Disposition: A | Discharge status: Home / Self Care | Payer: Medicare Other | Source: Ambulatory Visit | Attending: Nephrology | Admitting: Nephrology

## 2019-02-01 VITALS — BP 128/76 | HR 83 | Temp 95.9°F | Resp 20

## 2019-02-01 DIAGNOSIS — N184 Chronic kidney disease, stage 4 (severe): Secondary | ICD-10-CM | POA: Insufficient documentation

## 2019-02-01 DIAGNOSIS — E1122 Type 2 diabetes mellitus with diabetic chronic kidney disease: Secondary | ICD-10-CM | POA: Diagnosis not present

## 2019-02-01 LAB — SARS-COV-2 ANTIBODIES: SARS-CoV-2 Antibodies: NEGATIVE

## 2019-02-01 LAB — POCT HEMOGLOBIN-HEMACUE: Hemoglobin: 11.8 g/dL — ABNORMAL LOW (ref 12.0–15.0)

## 2019-02-01 MED ORDER — EPOETIN ALFA 10000 UNIT/ML IJ SOLN
INTRAMUSCULAR | Status: AC
  Filled 2019-02-01: qty 1

## 2019-02-01 MED ORDER — EPOETIN ALFA 20000 UNIT/ML IJ SOLN
INTRAMUSCULAR | Status: AC
  Filled 2019-02-01: qty 1

## 2019-02-01 MED ORDER — EPOETIN ALFA 10000 UNIT/ML IJ SOLN
30000.0000 [IU] | INTRAMUSCULAR | Status: DC
  Administered 2019-02-01: 30000 [IU] via SUBCUTANEOUS

## 2019-02-02 MED FILL — Epoetin Alfa Inj 10000 Unit/ML: INTRAMUSCULAR | Qty: 1 | Status: AC

## 2019-02-02 MED FILL — Epoetin Alfa Inj 20000 Unit/ML: INTRAMUSCULAR | Qty: 1 | Status: AC

## 2019-02-04 ENCOUNTER — Other Ambulatory Visit: Payer: Self-pay

## 2019-02-04 ENCOUNTER — Ambulatory Visit
Admission: RE | Admit: 2019-02-04 | Discharge: 2019-02-04 | Disposition: A | Discharge status: Home / Self Care | Payer: Medicare Other | Source: Ambulatory Visit | Attending: Obstetrics and Gynecology | Admitting: Obstetrics and Gynecology

## 2019-02-04 DIAGNOSIS — Z1231 Encounter for screening mammogram for malignant neoplasm of breast: Secondary | ICD-10-CM

## 2019-02-15 ENCOUNTER — Ambulatory Visit (HOSPITAL_COMMUNITY)
Admission: RE | Admit: 2019-02-15 | Discharge: 2019-02-15 | Disposition: A | Discharge status: Home / Self Care | Payer: Medicare Other | Source: Ambulatory Visit | Attending: Nephrology | Admitting: Nephrology

## 2019-02-15 ENCOUNTER — Other Ambulatory Visit: Payer: Self-pay

## 2019-02-15 VITALS — BP 129/70 | HR 85 | Temp 96.2°F | Resp 20

## 2019-02-15 DIAGNOSIS — E1122 Type 2 diabetes mellitus with diabetic chronic kidney disease: Secondary | ICD-10-CM | POA: Diagnosis not present

## 2019-02-15 DIAGNOSIS — N184 Chronic kidney disease, stage 4 (severe): Secondary | ICD-10-CM | POA: Diagnosis not present

## 2019-02-15 LAB — POCT HEMOGLOBIN-HEMACUE: Hemoglobin: 12.1 g/dL (ref 12.0–15.0)

## 2019-02-15 LAB — FERRITIN: Ferritin: 170 ng/mL (ref 11–307)

## 2019-02-15 LAB — IRON AND TIBC
Iron: 80 ug/dL (ref 28–170)
Saturation Ratios: 32 % — ABNORMAL HIGH (ref 10.4–31.8)
TIBC: 249 ug/dL — ABNORMAL LOW (ref 250–450)
UIBC: 169 ug/dL

## 2019-02-15 MED ORDER — EPOETIN ALFA 40000 UNIT/ML IJ SOLN: 30000.0000 [IU] | INTRAMUSCULAR | Status: DC

## 2019-02-24 DIAGNOSIS — H2513 Age-related nuclear cataract, bilateral: Secondary | ICD-10-CM | POA: Diagnosis not present

## 2019-02-24 DIAGNOSIS — H5213 Myopia, bilateral: Secondary | ICD-10-CM | POA: Diagnosis not present

## 2019-02-24 DIAGNOSIS — Z794 Long term (current) use of insulin: Secondary | ICD-10-CM | POA: Diagnosis not present

## 2019-02-24 DIAGNOSIS — E119 Type 2 diabetes mellitus without complications: Secondary | ICD-10-CM | POA: Diagnosis not present

## 2019-02-24 DIAGNOSIS — H52203 Unspecified astigmatism, bilateral: Secondary | ICD-10-CM | POA: Diagnosis not present

## 2019-02-24 LAB — HM DIABETES EYE EXAM

## 2019-02-26 ENCOUNTER — Other Ambulatory Visit: Payer: Self-pay

## 2019-02-26 ENCOUNTER — Emergency Department (HOSPITAL_COMMUNITY): Payer: Medicare Other

## 2019-02-26 ENCOUNTER — Observation Stay (HOSPITAL_COMMUNITY)
Admission: EM | Admit: 2019-02-26 | Discharge: 2019-02-28 | Disposition: A | Discharge status: Home / Self Care | Payer: Medicare Other | Attending: Family Medicine | Admitting: Family Medicine

## 2019-02-26 ENCOUNTER — Encounter (HOSPITAL_COMMUNITY): Payer: Self-pay | Admitting: *Deleted

## 2019-02-26 DIAGNOSIS — I700 Atherosclerosis of aorta: Secondary | ICD-10-CM | POA: Diagnosis not present

## 2019-02-26 DIAGNOSIS — Z87891 Personal history of nicotine dependence: Secondary | ICD-10-CM | POA: Diagnosis not present

## 2019-02-26 DIAGNOSIS — N184 Chronic kidney disease, stage 4 (severe): Secondary | ICD-10-CM | POA: Insufficient documentation

## 2019-02-26 DIAGNOSIS — Z20828 Contact with and (suspected) exposure to other viral communicable diseases: Secondary | ICD-10-CM | POA: Insufficient documentation

## 2019-02-26 DIAGNOSIS — Z03818 Encounter for observation for suspected exposure to other biological agents ruled out: Secondary | ICD-10-CM | POA: Diagnosis not present

## 2019-02-26 DIAGNOSIS — D571 Sickle-cell disease without crisis: Secondary | ICD-10-CM | POA: Diagnosis not present

## 2019-02-26 DIAGNOSIS — D509 Iron deficiency anemia, unspecified: Secondary | ICD-10-CM

## 2019-02-26 DIAGNOSIS — Z79899 Other long term (current) drug therapy: Secondary | ICD-10-CM | POA: Insufficient documentation

## 2019-02-26 DIAGNOSIS — Z9884 Bariatric surgery status: Secondary | ICD-10-CM | POA: Diagnosis not present

## 2019-02-26 DIAGNOSIS — I10 Essential (primary) hypertension: Secondary | ICD-10-CM | POA: Diagnosis present

## 2019-02-26 DIAGNOSIS — I129 Hypertensive chronic kidney disease with stage 1 through stage 4 chronic kidney disease, or unspecified chronic kidney disease: Secondary | ICD-10-CM | POA: Diagnosis present

## 2019-02-26 DIAGNOSIS — N289 Disorder of kidney and ureter, unspecified: Secondary | ICD-10-CM | POA: Diagnosis not present

## 2019-02-26 DIAGNOSIS — K8020 Calculus of gallbladder without cholecystitis without obstruction: Secondary | ICD-10-CM | POA: Diagnosis not present

## 2019-02-26 DIAGNOSIS — Z794 Long term (current) use of insulin: Secondary | ICD-10-CM | POA: Diagnosis not present

## 2019-02-26 DIAGNOSIS — M810 Age-related osteoporosis without current pathological fracture: Secondary | ICD-10-CM | POA: Diagnosis not present

## 2019-02-26 DIAGNOSIS — M8580 Other specified disorders of bone density and structure, unspecified site: Secondary | ICD-10-CM | POA: Diagnosis not present

## 2019-02-26 DIAGNOSIS — I2510 Atherosclerotic heart disease of native coronary artery without angina pectoris: Secondary | ICD-10-CM | POA: Diagnosis present

## 2019-02-26 DIAGNOSIS — E785 Hyperlipidemia, unspecified: Secondary | ICD-10-CM | POA: Diagnosis not present

## 2019-02-26 DIAGNOSIS — Z7982 Long term (current) use of aspirin: Secondary | ICD-10-CM | POA: Insufficient documentation

## 2019-02-26 DIAGNOSIS — M545 Low back pain, unspecified: Secondary | ICD-10-CM | POA: Diagnosis present

## 2019-02-26 DIAGNOSIS — E119 Type 2 diabetes mellitus without complications: Secondary | ICD-10-CM | POA: Diagnosis present

## 2019-02-26 DIAGNOSIS — N2889 Other specified disorders of kidney and ureter: Secondary | ICD-10-CM | POA: Diagnosis not present

## 2019-02-26 DIAGNOSIS — Z8249 Family history of ischemic heart disease and other diseases of the circulatory system: Secondary | ICD-10-CM | POA: Insufficient documentation

## 2019-02-26 DIAGNOSIS — N281 Cyst of kidney, acquired: Secondary | ICD-10-CM | POA: Insufficient documentation

## 2019-02-26 DIAGNOSIS — E1122 Type 2 diabetes mellitus with diabetic chronic kidney disease: Secondary | ICD-10-CM | POA: Insufficient documentation

## 2019-02-26 DIAGNOSIS — M47816 Spondylosis without myelopathy or radiculopathy, lumbar region: Secondary | ICD-10-CM

## 2019-02-26 DIAGNOSIS — M5136 Other intervertebral disc degeneration, lumbar region: Secondary | ICD-10-CM | POA: Insufficient documentation

## 2019-02-26 LAB — URINALYSIS, ROUTINE W REFLEX MICROSCOPIC
Bacteria, UA: NONE SEEN
Bilirubin Urine: NEGATIVE
Glucose, UA: NEGATIVE mg/dL
Hgb urine dipstick: NEGATIVE
Ketones, ur: 5 mg/dL — AB
Leukocytes,Ua: NEGATIVE
Nitrite: NEGATIVE
Protein, ur: 30 mg/dL — AB
Specific Gravity, Urine: 1.009 (ref 1.005–1.030)
pH: 5 (ref 5.0–8.0)

## 2019-02-26 LAB — CBC
HCT: 36.4 % (ref 36.0–46.0)
Hemoglobin: 11.4 g/dL — ABNORMAL LOW (ref 12.0–15.0)
MCH: 23.3 pg — ABNORMAL LOW (ref 26.0–34.0)
MCHC: 31.3 g/dL (ref 30.0–36.0)
MCV: 74.4 fL — ABNORMAL LOW (ref 80.0–100.0)
Platelets: 137 10*3/uL — ABNORMAL LOW (ref 150–400)
RBC: 4.89 MIL/uL (ref 3.87–5.11)
RDW: 16.8 % — ABNORMAL HIGH (ref 11.5–15.5)
WBC: 10.4 10*3/uL (ref 4.0–10.5)
nRBC: 0 % (ref 0.0–0.2)

## 2019-02-26 LAB — COMPREHENSIVE METABOLIC PANEL
ALT: 30 U/L (ref 0–44)
AST: 21 U/L (ref 15–41)
Albumin: 3.9 g/dL (ref 3.5–5.0)
Alkaline Phosphatase: 63 U/L (ref 38–126)
Anion gap: 9 (ref 5–15)
BUN: 46 mg/dL — ABNORMAL HIGH (ref 8–23)
CO2: 19 mmol/L — ABNORMAL LOW (ref 22–32)
Calcium: 8.8 mg/dL — ABNORMAL LOW (ref 8.9–10.3)
Chloride: 106 mmol/L (ref 98–111)
Creatinine, Ser: 2.84 mg/dL — ABNORMAL HIGH (ref 0.44–1.00)
GFR calc Af Amer: 19 mL/min — ABNORMAL LOW (ref >60)
GFR calc non Af Amer: 16 mL/min — ABNORMAL LOW (ref >60)
Glucose, Bld: 186 mg/dL — ABNORMAL HIGH (ref 70–99)
Potassium: 4.3 mmol/L (ref 3.5–5.1)
Sodium: 134 mmol/L — ABNORMAL LOW (ref 135–145)
Total Bilirubin: 0.7 mg/dL (ref 0.3–1.2)
Total Protein: 7.4 g/dL (ref 6.5–8.1)

## 2019-02-26 LAB — LIPASE, BLOOD: Lipase: 54 U/L — ABNORMAL HIGH (ref 11–51)

## 2019-02-26 MED ORDER — MORPHINE SULFATE (PF) 4 MG/ML IV SOLN: 4.0000 mg | INTRAVENOUS | Status: DC | PRN

## 2019-02-26 MED ORDER — SODIUM CHLORIDE 0.9% FLUSH: 3.0000 mL | Freq: Once | INTRAVENOUS | Status: DC

## 2019-02-26 MED ORDER — MORPHINE SULFATE (PF) 4 MG/ML IV SOLN
4.0000 mg | INTRAVENOUS | Status: AC | PRN
  Administered 2019-02-26 (×2): 4 mg via INTRAVENOUS
  Filled 2019-02-26 (×2): qty 1

## 2019-02-26 MED ORDER — ONDANSETRON HCL 4 MG/2ML IJ SOLN
4.0000 mg | INTRAMUSCULAR | Status: DC | PRN
  Administered 2019-02-27: 4 mg via INTRAVENOUS
  Filled 2019-02-26: qty 2

## 2019-02-26 MED ORDER — METHOCARBAMOL 1000 MG/10ML IJ SOLN
1000.0000 mg | Freq: Once | INTRAVENOUS | Status: AC
  Administered 2019-02-27: 1000 mg via INTRAVENOUS
  Filled 2019-02-26: qty 10

## 2019-02-26 MED ORDER — SODIUM CHLORIDE 0.9 % IV BOLUS
500.0000 mL | Freq: Once | INTRAVENOUS | Status: AC
  Administered 2019-02-26: 500 mL via INTRAVENOUS

## 2019-02-26 MED ORDER — ONDANSETRON HCL 4 MG/2ML IJ SOLN
4.0000 mg | INTRAMUSCULAR | Status: AC | PRN
  Administered 2019-02-26 (×2): 4 mg via INTRAVENOUS
  Filled 2019-02-26 (×2): qty 2

## 2019-02-26 MED ORDER — ONDANSETRON HCL 4 MG/2ML IJ SOLN
INTRAMUSCULAR | Status: AC
  Administered 2019-02-26: 1 mg
  Filled 2019-02-26: qty 2

## 2019-02-26 MED ORDER — LORAZEPAM 2 MG/ML IJ SOLN
1.0000 mg | Freq: Once | INTRAMUSCULAR | Status: AC
  Administered 2019-02-26: 1 mg via INTRAVENOUS
  Filled 2019-02-26: qty 1

## 2019-02-26 NOTE — ED Notes (Signed)
Pt ambulatory to the bathroom w/o assistance and with steady gait

## 2019-02-26 NOTE — ED Triage Notes (Signed)
Pt states she started to have abd and back pain today about 1230. Nausea with gagging at intervals.

## 2019-02-26 NOTE — ED Provider Notes (Signed)
[facility] DEPT Provider Note   CSN: [id number] Arrival date & time: 02/26/19  1730     History   Chief Complaint Chief Complaint  Patient presents with  . Abdominal Pain  . Back Pain  . Nausea    HPI Miranda Roman is a 73 y.o. female.     HPI  Pt was seen at 1915.  Per pt, c/o gradual onset and persistence of constant generalized abd "pain" and lower back "pains" since approximately 1230 today.  Has been associated with several episodes of N/V.  Denies diarrhea, no fevers, no back pain, no rash, no CP/SOB, no black or blood in stools or emesis, no dysuria/hematuria.         Past Medical History:  Diagnosis Date  . Anemia   . Diabetes mellitus   . Hyperlipidemia   . Hypertension   . Osteoporosis   . Sickle cell anemia [facility])     Patient Active Problem List   Diagnosis Date Noted  . Abnormal CT of the chest 12/28/2018  . Atrophic vaginitis 12/15/2018  . Atypical squamous cells of undetermined significance on cytologic smear of cervix (ASC-US) 12/15/2018  . Overweight with body mass index (BMI) 25.0-29.9 12/15/2018  . 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 Osteoporosis 12/15/2018  . Unspecified dyspareunia (CODE) 12/15/2018  . 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 Gastroesophageal reflux disease 08/26/2016  . Aortic valve regurgitation 02/11/2016  . Chest pain at rest 01/30/2015  . Atherosclerosis of native coronary artery of native heart without angina pectoris 12/24/2012  . Essential hypertension 12/24/2012  . Hyperlipidemia 12/24/2012  . 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 03/10/2012  . Diabetes mellitus with stage 4 chronic kidney disease ([place]) 03/10/2012    Past Surgical History:  Procedure Laterality Date  . BONE RESECTION  01/2006   wrist  . 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 TUBAL LIGATION  04/28/1977     OB History   No  obstetric history on file.      Home Medications    Prior to Admission medications   Medication Sig Start Date End Date Taking? Authorizing Provider  aspirin 81 MG tablet Take 81 mg by mouth daily.    [provider]  atorvastatin (LIPITOR) 40 MG tablet Take 40 mg by mouth daily.    [provider]  benzonatate (TESSALON PERLES) 100 MG capsule Take 1 capsule (100 mg total) by mouth every 6 (six) hours as needed for cough. Patient not taking: Reported on 12/28/2018 09/16/18 09/16/19  Glendale Chard, MD  calcitRIOL (ROCALTROL) 0.5 MCG capsule Take 0.5 mcg by mouth daily. 2 tabs 1 day and 3 tabs the next day alternating 2 tabs and 3 tabs    [provider]  dexlansoprazole (DEXILANT) 60 MG capsule Take 1 capsule (60 mg total) by mouth daily. Patient not taking: Reported on 01/26/2019 09/16/18   Glendale Chard, MD  epoetin alfa (EPOGEN,PROCRIT) 00867 UNIT/ML injection 10,000 Units once. Takes every 2 weeks    [provider]  glucose blood (ONETOUCH VERIO) test strip Use as instructed to check blood sugars 1 time per day e11.22 09/23/18   Glendale Chard, MD  Insulin Detemir (LEVEMIR FLEXTOUCH) 100 UNIT/ML Pen Inject 16 Units into the skin daily. Patient taking differently: Inject 18 Units into the skin daily. 16 - 18 units 05/19/18   Glendale Chard, MD  insulin lispro (HUMALOG) 100 UNIT/ML injection Inject 0.04 mLs (4 Units total)  into the skin 3 (three) times daily before meals. Sliding scale 01/26/19   Glendale Chard, MD  loratadine (CLARITIN) 10 MG tablet Take 1 tablet (10 mg total) by mouth daily. 10/28/18 10/28/19  Glendale Chard, MD  [facility] DELICA LANCETS 10F MISC 1 each by Does not apply route daily. DX: E11.65 04/22/18   Glendale Chard, MD  quinapril-hydrochlorothiazide [facility]) 20-12.5 MG tablet Take 1/2 tablet by mouth daily 05/19/18   Glendale Chard, MD    Family History Family History  Problem Relation Age of Onset  . Diabetes Mother   . Heart  disease Mother   . Diabetes Father   . Heart disease Father   . Diabetes Sister   . Diabetes Maternal Aunt   . Diabetes Maternal Uncle   . Diabetes Maternal Grandmother   . Diabetes Maternal Grandfather     Social History Social History   Tobacco Use  . Smoking status: Former Smoker    Packs/day: 0.25    Years: 40.00    Pack years: 10.00    Types: Cigarettes    Quit date: 04/2005    Years since quitting: 13.9  . Smokeless tobacco: Never Used  Substance Use Topics  . Alcohol use: Yes    Comment: occasionally  . Drug use: No     Allergies   Patient has no known allergies.   Review of Systems Review of Systems ROS: Statement: All systems negative except as marked or noted in the HPI; Constitutional: Negative for fever and chills. ; ; Eyes: Negative for eye pain, redness and discharge. ; ; ENMT: Negative for ear pain, hoarseness, nasal congestion, sinus pressure and sore throat. ; ; Cardiovascular: Negative for chest pain, palpitations, diaphoresis, dyspnea and peripheral edema. ; ; Respiratory: Negative for cough, wheezing and stridor. ; ; Gastrointestinal: +N/V, abd pain. Negative for diarrhea, blood in stool, hematemesis, jaundice and rectal bleeding. . ; ; Genitourinary: Negative for dysuria, flank pain and hematuria. ; ; Musculoskeletal: +LBP. Negative for neck pain. Negative for swelling and trauma.; ; Skin: Negative for pruritus, rash, abrasions, blisters, bruising and skin lesion.; ; Neuro: Negative for headache, lightheadedness and neck stiffness. Negative for weakness, altered level of consciousness, altered mental status, extremity weakness, paresthesias, involuntary movement, seizure and syncope.       Physical Exam Updated Vital Signs BP (!) 150/78 (BP Location: Left Arm)   Pulse 80   Temp 99.1 F (37.3 C) (Oral)   Resp 16   SpO2 99%   Physical Exam 1920: Physical examination:  Nursing notes reviewed; Vital signs and O2 SAT reviewed;  Constitutional: Well  developed, Well nourished, Well hydrated, Uncomfortable appearing.; Head:  Normocephalic, atraumatic; Eyes: EOMI, PERRL, No scleral icterus; ENMT: Mouth and pharynx normal, Mucous membranes moist; Neck: Supple, Full range of motion, No lymphadenopathy; Cardiovascular: Regular rate and rhythm, No gallop; Respiratory: Breath sounds clear & equal bilaterally, No wheezes.  Speaking full sentences with ease, Normal respiratory effort/excursion; Chest: Nontender, Movement normal; Abdomen: Soft, +mild diffuse tenderness to palp. No rebound or guarding. Nondistended, Normal bowel sounds; Genitourinary: No CVA tenderness; Spine:  No midline CS, TS, LS tenderness. +TTP bilat lumbar paraspinal muscles;;  Extremities: Peripheral pulses normal, No tenderness, No edema, No calf edema or asymmetry.; Neuro: AA&Ox3, Major CN grossly intact.  Speech clear. No gross focal motor or sensory deficits in extremities.; Skin: Color normal, Warm, Dry.   ED Treatments / Results  Labs (all labs ordered are listed, but only abnormal results are displayed)   EKG None  Radiology  Procedures Procedures (including critical care time)  Medications Ordered in ED Medications  ondansetron (ZOFRAN) injection 4 mg (4 mg Intravenous Given 02/26/19 1956)  morphine 4 MG/ML injection 4 mg (2 mg Intravenous Given 02/26/19 2040)  sodium chloride 0.9 % bolus 500 mL (500 mLs Intravenous New Bag/Given 02/26/19 1956)     Initial Impression / Assessment and Plan / ED Course  I have reviewed the triage vital signs and the nursing notes.  Pertinent labs & imaging results that were available during my care of the patient were reviewed by me and considered in my medical decision making (see chart for details).     MDM Reviewed: previous chart, nursing note and vitals Reviewed previous: labs Interpretation: labs and CT scan    Results for orders placed or performed during the hospital encounter of 02/26/19  Lipase, blood  Result  Value Ref Range   Lipase 54 (H) 11 - 51 U/L  Comprehensive metabolic panel  Result Value Ref Range   Sodium 134 (L) 135 - 145 mmol/L   Potassium 4.3 3.5 - 5.1 mmol/L   Chloride 106 98 - 111 mmol/L   CO2 19 (L) 22 - 32 mmol/L   Glucose, Bld 186 (H) 70 - 99 mg/dL   BUN 46 (H) 8 - 23 mg/dL   Creatinine, Ser 2.84 (H) 0.44 - 1.00 mg/dL   Calcium 8.8 (L) 8.9 - 10.3 mg/dL   Total Protein 7.4 6.5 - 8.1 g/dL   Albumin 3.9 3.5 - 5.0 g/dL   AST 21 15 - 41 U/L   ALT 30 0 - 44 U/L   Alkaline Phosphatase 63 38 - 126 U/L   Total Bilirubin 0.7 0.3 - 1.2 mg/dL   GFR calc non Af Amer 16 (L) >60 mL/min   GFR calc Af Amer 19 (L) >60 mL/min   Anion gap 9 5 - 15  CBC  Result Value Ref Range   WBC 10.4 4.0 - 10.5 K/uL   RBC 4.89 3.87 - 5.11 MIL/uL   Hemoglobin 11.4 (L) 12.0 - 15.0 g/dL   HCT 36.4 36.0 - 46.0 %   MCV 74.4 (L) 80.0 - 100.0 fL   MCH 23.3 (L) 26.0 - 34.0 pg   MCHC 31.3 30.0 - 36.0 g/dL   RDW 16.8 (H) 11.5 - 15.5 %   Platelets 137 (L) 150 - 400 K/uL   nRBC 0.0 0.0 - 0.2 %  Urinalysis, Routine w reflex microscopic  Result Value Ref Range   Color, Urine STRAW (A) YELLOW   APPearance CLEAR CLEAR   Specific Gravity, Urine 1.009 1.005 - 1.030   pH 5.0 5.0 - 8.0   Glucose, UA NEGATIVE NEGATIVE mg/dL   Hgb urine dipstick NEGATIVE NEGATIVE   Bilirubin Urine NEGATIVE NEGATIVE   Ketones, ur 5 (A) NEGATIVE mg/dL   Protein, ur 30 (A) NEGATIVE mg/dL   Nitrite NEGATIVE NEGATIVE   Leukocytes,Ua NEGATIVE NEGATIVE   RBC / HPF 0-5 0 - 5 RBC/hpf   WBC, UA 0-5 0 - 5 WBC/hpf   Bacteria, UA NONE SEEN NONE SEEN   Squamous Epithelial / LPF 0-5 0 - 5   Mucus PRESENT    Ct Abdomen Pelvis Wo Contrast Result Date: 02/26/2019 CLINICAL DATA:  Abdominal pain EXAM: CT ABDOMEN AND PELVIS WITHOUT CONTRAST TECHNIQUE: Multidetector CT imaging of the abdomen and pelvis was performed following the standard protocol without IV contrast. COMPARISON:  MR abdomen, 08/27/2018 FINDINGS: Lower chest: No acute  abnormality. Mild bibasilar scarring. Left coronary artery calcification. Hepatobiliary:  No solid liver abnormality is seen. Small dependent gallstones and/or sludge. No gallbladder wall thickening or biliary dilatation. Pancreas: Unremarkable. Lesions described on prior MR examination are not appreciated by noncontrast CT. No pancreatic ductal dilatation or surrounding inflammatory changes. Spleen: Normal in size without significant abnormality. Adrenals/Urinary Tract: Adrenal glands are unremarkable. Kidneys are normal, without renal calculi, solid lesion, or hydronephrosis. Bladder is unremarkable. Stomach/Bowel: Status post Roux-en-Y gastric bypass. Appendix appears normal. No evidence of bowel wall thickening, distention, or inflammatory changes. Occasional colonic diverticula. Vascular/Lymphatic: Aortic atherosclerosis. No enlarged abdominal or pelvic lymph nodes. Reproductive: No mass or other significant abnormality. Other: No abdominal wall hernia or abnormality. No abdominopelvic ascites. Musculoskeletal: There is mild osteopenia with ill-defined sclerosis of some vertebral bodies, particularly L4 and L5 (series 6, image 101, series 5, image 95). There is severe, serpiginous sclerosis of the right femoral head and to a lesser extent the left femoral head, consistent with a vascular necrosis. IMPRESSION: 1. No acute noncontrast CT findings of the abdomen or pelvis to explain back or abdominal pain or nausea. 2.  Postoperative findings of Roux-en-Y gastric bypass. 3. Small dependent gallstones and/or sludge. No gallbladder wall thickening or biliary dilatation. 4. There is mild osteopenia with ill-defined sclerosis of some vertebral bodies, particularly L4 and L5 (series 6, image 101, series 5, image 95). This is of uncertain significance and osseous metastatic disease is not strictly excluded. This can be further evaluated by contrast enhanced MRI if reported back pain is felt acutely referable. There is no  evidence of fracture. 5. Pancreatic lesions described on prior MR examination are not appreciated by noncontrast CT. Electronically Signed   By: Eddie Candle M.D.   On: 02/26/2019 20:32    Miranda Roman was evaluated in Emergency Department on 02/26/2019 for the symptoms described in the history of present illness. She was evaluated in the context of the global COVID-19 pandemic, which necessitated consideration that the patient might be at risk for infection with the SARS-CoV-2 virus that causes COVID-19. Institutional protocols and algorithms that pertain to the evaluation of patients at risk for COVID-19 are in a state of rapid change based on information released by regulatory bodies including the CDC and federal and state organizations. These policies and algorithms were followed during the patient's care in the ED.   2340:  Labs within pt's baseline. MRI LS pending. Pt c/o pain and nausea despite multiple doses of medications for both; may need admit. Sign out to Dr. Roxanne Mins.     Final Clinical Impressions(s) / ED Diagnoses   Final diagnoses:  None    ED Discharge Orders    None       Francine Graven, DO 02/26/19 2347

## 2019-02-26 NOTE — ED Provider Notes (Signed)
11:57 PM Care assumed from Dr. Thurnell Garbe, patient with low back pain and CT suggesting possible metastatic disease pending MRI.  MRI shows no acute process in the lumbar spine but does identify a lesion in the lower pole of left kidney with recommendation for follow-up renal ultrasound.  At this point, patient has received 16 mg of morphine and 1 mg of lorazepam.  She is very drowsy but still is complaining of pain 7/10.  She has renal insufficiency and is unable to use NSAIDs because of that.  We will give a trial of methocarbamol.  She may need to be admitted for pain control.  Following methocarbamol, patient was continued to complain of severe pain.  Attempts to ambulate her show that she was able to stand but not ambulate.  She is not safe for discharge.  Case is discussed with Dr. Renaee Munda of [facility] who agrees to admit the patient for purpose of pain control.  She may need short stay in the rehabilitation center.  Results for orders placed or performed during the hospital encounter of 02/26/19  Lipase, blood  Result Value Ref Range   Lipase 54 (H) 11 - 51 U/L  Comprehensive metabolic panel  Result Value Ref Range   Sodium 134 (L) 135 - 145 mmol/L   Potassium 4.3 3.5 - 5.1 mmol/L   Chloride 106 98 - 111 mmol/L   CO2 19 (L) 22 - 32 mmol/L   Glucose, Bld 186 (H) 70 - 99 mg/dL   BUN 46 (H) 8 - 23 mg/dL   Creatinine, Ser 2.84 (H) 0.44 - 1.00 mg/dL   Calcium 8.8 (L) 8.9 - 10.3 mg/dL   Total Protein 7.4 6.5 - 8.1 g/dL   Albumin 3.9 3.5 - 5.0 g/dL   AST 21 15 - 41 U/L   ALT 30 0 - 44 U/L   Alkaline Phosphatase 63 38 - 126 U/L   Total Bilirubin 0.7 0.3 - 1.2 mg/dL   GFR calc non Af Amer 16 (L) >60 mL/min   GFR calc Af Amer 19 (L) >60 mL/min   Anion gap 9 5 - 15  CBC  Result Value Ref Range   WBC 10.4 4.0 - 10.5 K/uL   RBC 4.89 3.87 - 5.11 MIL/uL   Hemoglobin 11.4 (L) 12.0 - 15.0 g/dL   HCT 36.4 36.0 - 46.0 %   MCV 74.4 (L) 80.0 - 100.0 fL   MCH 23.3 (L) 26.0 - 34.0 pg   MCHC  31.3 30.0 - 36.0 g/dL   RDW 16.8 (H) 11.5 - 15.5 %   Platelets 137 (L) 150 - 400 K/uL   nRBC 0.0 0.0 - 0.2 %  Urinalysis, Routine w reflex microscopic  Result Value Ref Range   Color, Urine STRAW (A) YELLOW   APPearance CLEAR CLEAR   Specific Gravity, Urine 1.009 1.005 - 1.030   pH 5.0 5.0 - 8.0   Glucose, UA NEGATIVE NEGATIVE mg/dL   Hgb urine dipstick NEGATIVE NEGATIVE   Bilirubin Urine NEGATIVE NEGATIVE   Ketones, ur 5 (A) NEGATIVE mg/dL   Protein, ur 30 (A) NEGATIVE mg/dL   Nitrite NEGATIVE NEGATIVE   Leukocytes,Ua NEGATIVE NEGATIVE   RBC / HPF 0-5 0 - 5 RBC/hpf   WBC, UA 0-5 0 - 5 WBC/hpf   Bacteria, UA NONE SEEN NONE SEEN   Squamous Epithelial / LPF 0-5 0 - 5   Mucus PRESENT    Ct Abdomen Pelvis Wo Contrast  Result Date: 02/26/2019 CLINICAL DATA:  Abdominal pain EXAM:  CT ABDOMEN AND PELVIS WITHOUT CONTRAST TECHNIQUE: Multidetector CT imaging of the abdomen and pelvis was performed following the standard protocol without IV contrast. COMPARISON:  MR abdomen, 08/27/2018 FINDINGS: Lower chest: No acute abnormality. Mild bibasilar scarring. Left coronary artery calcification. Hepatobiliary: No solid liver abnormality is seen. Small dependent gallstones and/or sludge. No gallbladder wall thickening or biliary dilatation. Pancreas: Unremarkable. Lesions described on prior MR examination are not appreciated by noncontrast CT. No pancreatic ductal dilatation or surrounding inflammatory changes. Spleen: Normal in size without significant abnormality. Adrenals/Urinary Tract: Adrenal glands are unremarkable. Kidneys are normal, without renal calculi, solid lesion, or hydronephrosis. Bladder is unremarkable. Stomach/Bowel: Status post Roux-en-Y gastric bypass. Appendix appears normal. No evidence of bowel wall thickening, distention, or inflammatory changes. Occasional colonic diverticula. Vascular/Lymphatic: Aortic atherosclerosis. No enlarged abdominal or pelvic lymph nodes. Reproductive: No  mass or other significant abnormality. Other: No abdominal wall hernia or abnormality. No abdominopelvic ascites. Musculoskeletal: There is mild osteopenia with ill-defined sclerosis of some vertebral bodies, particularly L4 and L5 (series 6, image 101, series 5, image 95). There is severe, serpiginous sclerosis of the right femoral head and to a lesser extent the left femoral head, consistent with a vascular necrosis. IMPRESSION: 1. No acute noncontrast CT findings of the abdomen or pelvis to explain back or abdominal pain or nausea. 2.  Postoperative findings of Roux-en-Y gastric bypass. 3. Small dependent gallstones and/or sludge. No gallbladder wall thickening or biliary dilatation. 4. There is mild osteopenia with ill-defined sclerosis of some vertebral bodies, particularly L4 and L5 (series 6, image 101, series 5, image 95). This is of uncertain significance and osseous metastatic disease is not strictly excluded. This can be further evaluated by contrast enhanced MRI if reported back pain is felt acutely referable. There is no evidence of fracture. 5. Pancreatic lesions described on prior MR examination are not appreciated by noncontrast CT. Electronically Signed   By: Eddie Candle M.D.   On: 02/26/2019 20:32   Mr Lumbar Spine Wo Contrast  Result Date: 02/26/2019 CLINICAL DATA:  Initial evaluation for acute low back pain radiating into the abdomen for about 12 hours. Chronic kidney disease. EXAM: MRI LUMBAR SPINE WITHOUT CONTRAST TECHNIQUE: Multiplanar, multisequence MR imaging of the lumbar spine was performed. No intravenous contrast was administered. COMPARISON:  Prior CT of the abdomen and pelvis from earlier the same day. FINDINGS: Segmentation: Standard. Lowest well-formed disc labeled the L5-S1 level. Alignment: Mild levoscoliosis. Alignment otherwise normal with preservation of the normal lumbar lordosis. No listhesis or subluxation. Vertebrae: Vertebral body height maintained without evidence for  acute or chronic fracture. Bone marrow signal intensity diffusely heterogeneous, likely related to chronic renal disease. Superimposed benign hemangioma noted within the T12 vertebral body. No other discrete or worrisome osseous lesions. Mild reactive marrow edema noted about the left L3-4 facet due to facet arthritis (series 5, image 11). No other abnormal marrow edema. Conus medullaris and cauda equina: Conus extends to the L1 level. Conus and cauda equina appear normal. Paraspinal and other soft tissues: Paraspinous soft tissues demonstrate no acute finding. Few scattered T2 hyperintense renal cysts noted bilaterally. There is a 2 cm T1 hyperintense lesion at the lower pole of the left kidney (series 7, image 18). This is not visible on previous noncontrast abdominal CT. Visualized visceral structures otherwise unremarkable. Disc levels: T10-11: Unremarkable. T11-12: Mild disc bulge with disc desiccation. No stenosis or impingement. T12-L1: Unremarkable. L1-2:  Unremarkable. L2-3: Mild diffuse disc bulge with disc desiccation. Mild bilateral facet hypertrophy. No significant  canal or foraminal stenosis. No impingement. L3-4: Disc desiccation with minimal annular disc bulge. Mild-to-moderate facet hypertrophy, greater on the left, with trace left-sided joint effusion and associated reactive marrow edema. No significant canal or lateral recess stenosis. Foramina remain patent. L4-5: Mild diffuse disc bulge with disc desiccation. Superimposed small central annular fissure. Mild bilateral facet hypertrophy. No significant canal or lateral recess stenosis. Mild right L4 foraminal narrowing. L5-S1: Chronic intervertebral disc space narrowing with diffuse disc bulge and disc desiccation. Mild reactive endplate changes. Superimposed mild bilateral facet hypertrophy. No significant canal or lateral recess stenosis. Foramina remain patent. IMPRESSION: 1. No acute abnormality within the lumbar spine. 2. Mild for age  multilevel degenerative disc disease as above. No focal disc herniation, significant stenosis, or evidence for neural impingement. 3. 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 Finding could contribute to lower back pain. 4. 2 cm T1 hyperintense lesion at the lower pole of the left kidney, indeterminate. While this finding could reflect a proteinaceous or hemorrhagic cyst, a possible renal neoplasm could also have this appearance. Finding is not well visualized on previous noncontrast abdominal CT. Given this, further assessment with dedicated renal ultrasound recommended for further characterization. If findings are equivocal, consideration could be made for further assessment with dedicated renal mass protocol CT. Electronically Signed   By: Jeannine Boga M.D.   On: 02/26/2019 23:37   Mm 3d Screen Breast Bilateral  Result Date: 02/04/2019 CLINICAL DATA:  Screening. EXAM: DIGITAL SCREENING BILATERAL MAMMOGRAM WITH TOMO AND CAD COMPARISON:  Previous exam(s). ACR Breast Density Category b: There are scattered areas of fibroglandular density. FINDINGS: There are no findings suspicious for malignancy. Images were processed with CAD. IMPRESSION: No mammographic evidence of malignancy. A result letter of this screening mammogram will be mailed directly to the patient. RECOMMENDATION: Screening mammogram in one year. (Code:SM-B-01Y) BI-RADS CATEGORY  1: Negative. Electronically Signed   By: Ammie Ferrier M.D.   On: 39/09/90 33:00      Delora Fuel, MD 76/22/63 [id number]

## 2019-02-26 NOTE — ED Notes (Signed)
Patient transported to MRI 

## 2019-02-26 NOTE — ED Notes (Signed)
Daughter, [name], is leaving. She would like a call with an update on her mother in the morning [phone].

## 2019-02-27 DIAGNOSIS — N184 Chronic kidney disease, stage 4 (severe): Secondary | ICD-10-CM

## 2019-02-27 DIAGNOSIS — E1122 Type 2 diabetes mellitus with diabetic chronic kidney disease: Secondary | ICD-10-CM

## 2019-02-27 DIAGNOSIS — M47816 Spondylosis without myelopathy or radiculopathy, lumbar region: Secondary | ICD-10-CM

## 2019-02-27 DIAGNOSIS — M545 Low back pain, unspecified: Secondary | ICD-10-CM | POA: Diagnosis present

## 2019-02-27 DIAGNOSIS — I10 Essential (primary) hypertension: Secondary | ICD-10-CM

## 2019-02-27 HISTORY — DX: Spondylosis without myelopathy or radiculopathy, lumbar region: M47.816

## 2019-02-27 HISTORY — DX: Low back pain, unspecified: M54.50

## 2019-02-27 LAB — BASIC METABOLIC PANEL
Anion gap: 11 (ref 5–15)
BUN: 40 mg/dL — ABNORMAL HIGH (ref 8–23)
CO2: 19 mmol/L — ABNORMAL LOW (ref 22–32)
Calcium: 9.1 mg/dL (ref 8.9–10.3)
Chloride: 112 mmol/L — ABNORMAL HIGH (ref 98–111)
Creatinine, Ser: 2.42 mg/dL — ABNORMAL HIGH (ref 0.44–1.00)
GFR calc Af Amer: 23 mL/min — ABNORMAL LOW (ref >60)
GFR calc non Af Amer: 19 mL/min — ABNORMAL LOW (ref >60)
Glucose, Bld: 251 mg/dL — ABNORMAL HIGH (ref 70–99)
Potassium: 4.9 mmol/L (ref 3.5–5.1)
Sodium: 142 mmol/L (ref 135–145)

## 2019-02-27 LAB — CBC
HCT: 33.4 % — ABNORMAL LOW (ref 36.0–46.0)
Hemoglobin: 10.2 g/dL — ABNORMAL LOW (ref 12.0–15.0)
MCH: 22.6 pg — ABNORMAL LOW (ref 26.0–34.0)
MCHC: 30.5 g/dL (ref 30.0–36.0)
MCV: 73.9 fL — ABNORMAL LOW (ref 80.0–100.0)
Platelets: 118 10*3/uL — ABNORMAL LOW (ref 150–400)
RBC: 4.52 MIL/uL (ref 3.87–5.11)
RDW: 16.5 % — ABNORMAL HIGH (ref 11.5–15.5)
WBC: 11.1 10*3/uL — ABNORMAL HIGH (ref 4.0–10.5)
nRBC: 0 % (ref 0.0–0.2)

## 2019-02-27 LAB — GLUCOSE, CAPILLARY
Glucose-Capillary: 185 mg/dL — ABNORMAL HIGH (ref 70–99)
Glucose-Capillary: 194 mg/dL — ABNORMAL HIGH (ref 70–99)
Glucose-Capillary: 153 mg/dL — ABNORMAL HIGH (ref 70–99)

## 2019-02-27 LAB — CBG MONITORING, ED: Glucose-Capillary: 244 mg/dL — ABNORMAL HIGH (ref 70–99)

## 2019-02-27 MED ORDER — PANTOPRAZOLE SODIUM 40 MG PO TBEC
40.0000 mg | DELAYED_RELEASE_TABLET | Freq: Every day | ORAL | Status: DC
  Administered 2019-02-27 – 2019-02-28 (×2): 40 mg via ORAL
  Filled 2019-02-27 (×2): qty 1

## 2019-02-27 MED ORDER — ONDANSETRON HCL 4 MG/2ML IJ SOLN
4.0000 mg | Freq: Four times a day (QID) | INTRAMUSCULAR | Status: DC | PRN
  Filled 2019-02-27: qty 2

## 2019-02-27 MED ORDER — ENOXAPARIN SODIUM 30 MG/0.3ML ~~LOC~~ SOLN: 30.0000 mg | SUBCUTANEOUS | Status: DC

## 2019-02-27 MED ORDER — MORPHINE SULFATE (PF) 2 MG/ML IV SOLN
2.0000 mg | INTRAVENOUS | Status: DC | PRN
  Administered 2019-02-27: 2 mg via INTRAVENOUS
  Filled 2019-02-27: qty 1

## 2019-02-27 MED ORDER — LISINOPRIL 10 MG PO TABS
10.0000 mg | ORAL_TABLET | Freq: Every day | ORAL | Status: DC
  Administered 2019-02-27 – 2019-02-28 (×2): 10 mg via ORAL
  Filled 2019-02-27 (×2): qty 1

## 2019-02-27 MED ORDER — ONDANSETRON HCL 4 MG PO TABS: 4.0000 mg | ORAL_TABLET | Freq: Four times a day (QID) | ORAL | Status: DC | PRN

## 2019-02-27 MED ORDER — INSULIN ASPART 100 UNIT/ML ~~LOC~~ SOLN
0.0000 [IU] | Freq: Every day | SUBCUTANEOUS | Status: DC
  Filled 2019-02-27: qty 0.05

## 2019-02-27 MED ORDER — BISACODYL 5 MG PO TBEC: 5.0000 mg | DELAYED_RELEASE_TABLET | Freq: Every day | ORAL | Status: DC | PRN

## 2019-02-27 MED ORDER — OXYCODONE HCL 5 MG PO TABS
10.0000 mg | ORAL_TABLET | ORAL | Status: DC | PRN
  Administered 2019-02-28: 10 mg via ORAL
  Filled 2019-02-27: qty 2

## 2019-02-27 MED ORDER — QUINAPRIL-HYDROCHLOROTHIAZIDE 20-12.5 MG PO TABS: 0.5000 | ORAL_TABLET | Freq: Every day | ORAL | Status: DC

## 2019-02-27 MED ORDER — SENNOSIDES-DOCUSATE SODIUM 8.6-50 MG PO TABS
2.0000 | ORAL_TABLET | Freq: Two times a day (BID) | ORAL | Status: DC
  Administered 2019-02-27 – 2019-02-28 (×3): 2 via ORAL
  Filled 2019-02-27 (×3): qty 2

## 2019-02-27 MED ORDER — INSULIN DETEMIR 100 UNIT/ML ~~LOC~~ SOLN
16.0000 [IU] | Freq: Every day | SUBCUTANEOUS | Status: DC
  Administered 2019-02-27 – 2019-02-28 (×2): 16 [IU] via SUBCUTANEOUS
  Filled 2019-02-27 (×3): qty 0.16

## 2019-02-27 MED ORDER — METHOCARBAMOL 500 MG PO TABS
500.0000 mg | ORAL_TABLET | Freq: Three times a day (TID) | ORAL | Status: DC
  Administered 2019-02-27 – 2019-02-28 (×4): 500 mg via ORAL
  Filled 2019-02-27 (×4): qty 1

## 2019-02-27 MED ORDER — MAGNESIUM CITRATE PO SOLN: 1.0000 | Freq: Once | ORAL | Status: DC | PRN

## 2019-02-27 MED ORDER — DOCUSATE SODIUM 100 MG PO CAPS: 100.0000 mg | ORAL_CAPSULE | Freq: Two times a day (BID) | ORAL | Status: DC

## 2019-02-27 MED ORDER — HYDRALAZINE HCL 20 MG/ML IJ SOLN: 10.0000 mg | Freq: Four times a day (QID) | INTRAMUSCULAR | Status: DC | PRN

## 2019-02-27 MED ORDER — ENOXAPARIN SODIUM 30 MG/0.3ML ~~LOC~~ SOLN
30.0000 mg | SUBCUTANEOUS | Status: DC
  Administered 2019-02-27 – 2019-02-28 (×2): 30 mg via SUBCUTANEOUS
  Filled 2019-02-27 (×2): qty 0.3

## 2019-02-27 MED ORDER — ACETAMINOPHEN 325 MG PO TABS
650.0000 mg | ORAL_TABLET | Freq: Three times a day (TID) | ORAL | Status: DC
  Administered 2019-02-27 – 2019-02-28 (×4): 650 mg via ORAL
  Filled 2019-02-27 (×4): qty 2

## 2019-02-27 MED ORDER — LIDOCAINE 5 % EX PTCH
1.0000 | MEDICATED_PATCH | CUTANEOUS | Status: DC
  Administered 2019-02-27 – 2019-02-28 (×2): 1 via TRANSDERMAL
  Filled 2019-02-27 (×2): qty 1

## 2019-02-27 MED ORDER — ASPIRIN EC 81 MG PO TBEC
81.0000 mg | DELAYED_RELEASE_TABLET | Freq: Every day | ORAL | Status: DC
  Administered 2019-02-27 – 2019-02-28 (×2): 81 mg via ORAL
  Filled 2019-02-27 (×2): qty 1

## 2019-02-27 MED ORDER — INSULIN ASPART 100 UNIT/ML ~~LOC~~ SOLN
0.0000 [IU] | Freq: Three times a day (TID) | SUBCUTANEOUS | Status: DC
  Administered 2019-02-27: 5 [IU] via SUBCUTANEOUS
  Administered 2019-02-27 – 2019-02-28 (×3): 3 [IU] via SUBCUTANEOUS
  Administered 2019-02-28: 2 [IU] via SUBCUTANEOUS
  Filled 2019-02-27: qty 0.15

## 2019-02-27 MED ORDER — CYCLOBENZAPRINE HCL 5 MG PO TABS: 7.5000 mg | ORAL_TABLET | Freq: Three times a day (TID) | ORAL | Status: DC | PRN

## 2019-02-27 NOTE — H&P (Signed)
History and Physical  Miranda Roman SPQ:[id number] DOB: [birth year] DOA: 02/26/2019   PCP: Glendale Chard, MD   Patient coming from: Home   Chief Complaint: back pain   HPI: Miranda Roman is a 72 y.o. female with medical history significant for CKD, DM, HLD, HTN being admitted for intractable low back pain. Patient is sleeping and was finally able to get comfortable so I didn't wake her up as daughter is present to give me history. Apparently she lives alone started having low back pain at 33:35KT on 6/25, complicated by some nausea and vomiting. She denied fevers, chills, cough, shortness of breath.    ED Course: In the ER, she was given a total of 62m of morphine as well as 151mof Lorazepam, despite which she was in significant pain and unable to ambulate. Imaging without acute findings as mentioned below. Due to her severe pain and somnolence from medications, she will need observation admission.   Review of Systems: Please see HPI for pertinent positives and negatives. A complete 10 system review of systems not done with patient as she has been in severe pain and is now sleeping.  Past Medical History:  Diagnosis Date  . Anemia   . Diabetes mellitus   . Hyperlipidemia   . Hypertension   . Osteoporosis   . Sickle cell anemia (HCC)    Past Surgical History:  Procedure Laterality Date  . BONE RESECTION  01/2006   wrist  . 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 TUBAL LIGATION  04/28/1977    Social History:  reports that she quit smoking about 13 years ago. Her smoking use included cigarettes. She has a 10.00 pack-year smoking history. She has never used smokeless tobacco. She reports current alcohol use. She reports that she does not use drugs.   No Known Allergies  Family History  Problem Relation Age of Onset  . Diabetes Mother   . Heart disease Mother   . Diabetes Father   . Heart disease Father   . Diabetes Sister   . Diabetes Maternal Aunt   . Diabetes Maternal Uncle    . Diabetes Maternal Grandmother   . Diabetes Maternal Grandfather      Prior to Admission medications   Medication Sig Start Date End Date Taking? Authorizing Provider  acetaminophen (TYLENOL) 500 MG tablet Take 500 mg by mouth every 6 (six) hours as needed for moderate pain.   Yes [provider]  aspirin 81 MG tablet Take 81 mg by mouth daily.   Yes [provider]  atorvastatin (LIPITOR) 40 MG tablet Take 40 mg by mouth daily.   Yes [provider]  calcitRIOL (ROCALTROL) 0.25 MCG capsule Take 0.5-1 mcg by mouth daily. 2 tabs 1 day and 3 tabs the next day alternating 2 tabs and 3 tabs   Yes [provider]  dexlansoprazole (DEXILANT) 60 MG capsule Take 1 capsule (60 mg total) by mouth daily. Patient taking differently: Take 60 mg by mouth daily as needed. Heart burn 09/16/18  Yes SaGlendale ChardMD  epoetin alfa (EPOGEN,PROCRIT) 1063893NIT/ML injection 10,000 Units once. Takes every 2 weeks   Yes [provider]  Insulin Detemir (LEVEMIR FLEXTOUCH) 100 UNIT/ML Pen Inject 16 Units into the skin daily. 05/19/18  Yes SaGlendale ChardMD  insulin lispro (HUMALOG) 100 UNIT/ML injection Inject 0.04 mLs (4 Units total) into the skin 3 (three) times daily before meals. Sliding scale Patient taking differently: Inject 4 Units into the skin 3 (three) times  daily before meals.  01/26/19  Yes Glendale Chard, MD  quinapril-hydrochlorothiazide (ACCURETIC) 20-12.5 MG tablet Take 1/2 tablet by mouth daily Patient taking differently: Take 0.5 tablets by mouth daily.  05/19/18  Yes Glendale Chard, MD  benzonatate (TESSALON PERLES) 100 MG capsule Take 1 capsule (100 mg total) by mouth every 6 (six) hours as needed for cough. Patient not taking: Reported on 12/28/2018 09/16/18 09/16/19  Glendale Chard, MD  glucose blood [facility] VERIO) test strip Use as instructed to check blood sugars 1 time per day e11.22 09/23/18   Glendale Chard, MD  loratadine (CLARITIN) 10 MG  tablet Take 1 tablet (10 mg total) by mouth daily. Patient not taking: Reported on 02/27/2019 10/28/18 10/28/19  Glendale Chard, MD  [facility] DELICA LANCETS 42V MISC 1 each by Does not apply route daily. DX: E11.65 04/22/18   Glendale Chard, MD    Physical Exam: BP (!) 150/76   Pulse 94   Temp 99.1 F (37.3 C) (Oral)   Resp 17   SpO2 95%   General:  Sleeping, no acute distress  Neck: supple, no masses seen Cardiovascular: RRR, no murmurs or rubs, no peripheral edema  Respiratory: clear to auscultation bilaterally Abdomen: soft, nontender Skin: dry, no rashes  Musculoskeletal: no joint effusions         Labs on Admission:  Basic Metabolic Panel: Recent Labs  Lab 02/26/19 1819  NA 134*  K 4.3  CL 106  CO2 19*  GLUCOSE 186*  BUN 46*  CREATININE 2.84*  CALCIUM 8.8*   Liver Function Tests: Recent Labs  Lab 02/26/19 1819  AST 21  ALT 30  ALKPHOS 63  BILITOT 0.7  PROT 7.4  ALBUMIN 3.9   Recent Labs  Lab 02/26/19 1819  LIPASE 54*   No results for input(s): AMMONIA in the last 168 hours. CBC: Recent Labs  Lab 02/26/19 1819  WBC 10.4  HGB 11.4*  HCT 36.4  MCV 74.4*  PLT 137*   Cardiac Enzymes: No results for input(s): CKTOTAL, CKMB, CKMBINDEX, TROPONINI in the last 168 hours.  BNP (last 3 results) No results for input(s): BNP in the last 8760 hours.  ProBNP (last 3 results) No results for input(s): PROBNP in the last 8760 hours.  CBG: No results for input(s): GLUCAP in the last 168 hours.  Radiological Exams on Admission: Ct Abdomen Pelvis Wo Contrast  Result Date: 02/26/2019 CLINICAL DATA:  Abdominal pain EXAM: CT ABDOMEN AND PELVIS WITHOUT CONTRAST TECHNIQUE: Multidetector CT imaging of the abdomen and pelvis was performed following the standard protocol without IV contrast. COMPARISON:  MR abdomen, 08/27/2018 FINDINGS: Lower chest: No acute abnormality. Mild bibasilar scarring. Left coronary artery calcification. Hepatobiliary: No solid liver  abnormality is seen. Small dependent gallstones and/or sludge. No gallbladder wall thickening or biliary dilatation. Pancreas: Unremarkable. Lesions described on prior MR examination are not appreciated by noncontrast CT. No pancreatic ductal dilatation or surrounding inflammatory changes. Spleen: Normal in size without significant abnormality. Adrenals/Urinary Tract: Adrenal glands are unremarkable. Kidneys are normal, without renal calculi, solid lesion, or hydronephrosis. Bladder is unremarkable. Stomach/Bowel: Status post Roux-en-Y gastric bypass. Appendix appears normal. No evidence of bowel wall thickening, distention, or inflammatory changes. Occasional colonic diverticula. Vascular/Lymphatic: Aortic atherosclerosis. No enlarged abdominal or pelvic lymph nodes. Reproductive: No mass or other significant abnormality. Other: No abdominal wall hernia or abnormality. No abdominopelvic ascites. Musculoskeletal: There is mild osteopenia with ill-defined sclerosis of some vertebral bodies, particularly L4 and L5 (series 6, image 101, series 5, image 95). There is  severe, serpiginous sclerosis of the right femoral head and to a lesser extent the left femoral head, consistent with a vascular necrosis. IMPRESSION: 1. No acute noncontrast CT findings of the abdomen or pelvis to explain back or abdominal pain or nausea. 2.  Postoperative findings of Roux-en-Y gastric bypass. 3. Small dependent gallstones and/or sludge. No gallbladder wall thickening or biliary dilatation. 4. There is mild osteopenia with ill-defined sclerosis of some vertebral bodies, particularly L4 and L5 (series 6, image 101, series 5, image 95). This is of uncertain significance and osseous metastatic disease is not strictly excluded. This can be further evaluated by contrast enhanced MRI if reported back pain is felt acutely referable. There is no evidence of fracture. 5. Pancreatic lesions described on prior MR examination are not appreciated by  noncontrast CT. Electronically Signed   By: Eddie Candle M.D.   On: 02/26/2019 20:32   Mr Lumbar Spine Wo Contrast  Result Date: 02/26/2019 CLINICAL DATA:  Initial evaluation for acute low back pain radiating into the abdomen for about 12 hours. Chronic kidney disease. EXAM: MRI LUMBAR SPINE WITHOUT CONTRAST TECHNIQUE: Multiplanar, multisequence MR imaging of the lumbar spine was performed. No intravenous contrast was administered. COMPARISON:  Prior CT of the abdomen and pelvis from earlier the same day. FINDINGS: Segmentation: Standard. Lowest well-formed disc labeled the L5-S1 level. Alignment: Mild levoscoliosis. Alignment otherwise normal with preservation of the normal lumbar lordosis. No listhesis or subluxation. Vertebrae: Vertebral body height maintained without evidence for acute or chronic fracture. Bone marrow signal intensity diffusely heterogeneous, likely related to chronic renal disease. Superimposed benign hemangioma noted within the T12 vertebral body. No other discrete or worrisome osseous lesions. Mild reactive marrow edema noted about the left L3-4 facet due to facet arthritis (series 5, image 11). No other abnormal marrow edema. Conus medullaris and cauda equina: Conus extends to the L1 level. Conus and cauda equina appear normal. Paraspinal and other soft tissues: Paraspinous soft tissues demonstrate no acute finding. Few scattered T2 hyperintense renal cysts noted bilaterally. There is a 2 cm T1 hyperintense lesion at the lower pole of the left kidney (series 7, image 18). This is not visible on previous noncontrast abdominal CT. Visualized visceral structures otherwise unremarkable. Disc levels: T10-11: Unremarkable. T11-12: Mild disc bulge with disc desiccation. No stenosis or impingement. T12-L1: Unremarkable. L1-2:  Unremarkable. L2-3: Mild diffuse disc bulge with disc desiccation. Mild bilateral facet hypertrophy. No significant canal or foraminal stenosis. No impingement. L3-4: Disc  desiccation with minimal annular disc bulge. Mild-to-moderate facet hypertrophy, greater on the left, with trace left-sided joint effusion and associated reactive marrow edema. No significant canal or lateral recess stenosis. Foramina remain patent. L4-5: Mild diffuse disc bulge with disc desiccation. Superimposed small central annular fissure. Mild bilateral facet hypertrophy. No significant canal or lateral recess stenosis. Mild right L4 foraminal narrowing. L5-S1: Chronic intervertebral disc space narrowing with diffuse disc bulge and disc desiccation. Mild reactive endplate changes. Superimposed mild bilateral facet hypertrophy. No significant canal or lateral recess stenosis. Foramina remain patent. IMPRESSION: 1. No acute abnormality within the lumbar spine. 2. Mild for age multilevel degenerative disc disease as above. No focal disc herniation, significant stenosis, or evidence for neural impingement. 3. 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 Finding could contribute to lower back pain. 4. 2 cm T1 hyperintense lesion at the lower pole of the left kidney, indeterminate. While this finding could reflect a proteinaceous or hemorrhagic cyst, a possible renal neoplasm could also have this appearance.  Finding is not well visualized on previous noncontrast abdominal CT. Given this, further assessment with dedicated renal ultrasound recommended for further characterization. If findings are equivocal, consideration could be made for further assessment with dedicated renal mass protocol CT. Electronically Signed   By: Jeannine Boga M.D.   On: 02/26/2019 23:37   Assessment/Plan Present on Admission: . Lumbago . Diabetes mellitus with stage 4 chronic kidney disease ([place]) . Atherosclerosis of native coronary artery of native heart without angina pectoris . Essential hypertension . Hyperlipidemia . 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   Disposition Plan: Home at DC, likely with home health therapy.   Consults called: None   Admission status: Observation   Time spent: 36 minutes  [place]on Garden Marry Guan MD [facility] Pager [phone]  If 7PM-7AM, please contact night-coverage [email or web address] Password [email or web address]  02/27/2019, 3:02 AM

## 2019-02-27 NOTE — ED Notes (Signed)
Attempted to ambulate pt with assistance of two NT. Pt was drowsy. When she stood up, her knees were weak and didn't appear to support her weight. We sat her back down. NT's wheeled her to restroom on "steady" device. Messaged provider.

## 2019-02-27 NOTE — ED Notes (Signed)
Hot Meal Provided to patient this AM.

## 2019-02-27 NOTE — ED Notes (Signed)
Report given to [place] for [place], Room 1529.  Agricultural consultant for [employer] has informed IT sales professional that COVID Test needs to populate before patient can be transported upstairs. RN Ali Lowe has understood and will transport patient upstairs once test has populated.

## 2019-02-27 NOTE — ED Notes (Signed)
Accidentally ordered IV team consult on wrong pt. Discontinued order.

## 2019-02-27 NOTE — ED Notes (Signed)
Attempted to ambulate pt to the bathroom Pt was able to stand for approximately 30 seconds but c/o of significant pain Pt was not able to ambulate independently to the bathroom Pt was able to use the Steady effectively Pt was assisted back into bed with 2 person assist

## 2019-02-27 NOTE — ED Notes (Signed)
Pt daughter, [name], requested that I call her with an update on her mother. Attempted to call [name] on her cell at [phone], call went to voicemail, and voice mailbox full. Unable to leave a message.

## 2019-02-27 NOTE — Progress Notes (Signed)
Patient seen and evaluated, chart reviewed, please see EMR for updated orders. Please see full H&P dictated by admitting physician Dr. Hollice Gong for same date of service.    Brief summary 73 y.o. female with medical history significant for CKD IV, DM, HLD, prior gastric bypass history,  HTN being admitted for intractable low back pain associated with nausea and vomiting.  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 Finding could contribute to lower back pain -Give methocarbamol, oxycodone as needed moderate pain IV morphine PRN severe pain -Physical therapy evaluation requested  --Diabetes mellitus--A1c was 7.5 about 5 months ago, continue Levemir 16 units and Use Novolog/Humalog Sliding scale insulin with Accu-Cheks/Fingersticks as ordered   --HTN- patient has CKD stage IV, HCTZ unlikely to be effective, she will stop HCTZ okay to give lisinopril - may use IV Hydralazine 10 mg  Every 4 hours Prn for systolic blood pressure over 160 mmhg  CKD IV--creatinine is down to 2.42 from 2.84 on admission, continue gentle hydration,  baseline creatinine between 2.4 and 3.0 Avoid Nsaids  Disposition--PTA patient lived alone, await physical therapy eval   Patient seen and evaluated, chart reviewed, please see EMR for updated orders. Please see full H&P dictated by admitting physician Dr. Hollice Gong for same date of service.   Roxan Hockey, MD

## 2019-02-27 NOTE — ED Notes (Signed)
Just received rx from pharmacy

## 2019-02-28 DIAGNOSIS — E1122 Type 2 diabetes mellitus with diabetic chronic kidney disease: Secondary | ICD-10-CM | POA: Diagnosis not present

## 2019-02-28 DIAGNOSIS — M47816 Spondylosis without myelopathy or radiculopathy, lumbar region: Secondary | ICD-10-CM | POA: Diagnosis not present

## 2019-02-28 DIAGNOSIS — M545 Low back pain: Secondary | ICD-10-CM | POA: Diagnosis not present

## 2019-02-28 DIAGNOSIS — I10 Essential (primary) hypertension: Secondary | ICD-10-CM | POA: Diagnosis not present

## 2019-02-28 DIAGNOSIS — I2510 Atherosclerotic heart disease of native coronary artery without angina pectoris: Secondary | ICD-10-CM | POA: Diagnosis not present

## 2019-02-28 LAB — GLUCOSE, CAPILLARY
Glucose-Capillary: 140 mg/dL — ABNORMAL HIGH (ref 70–99)
Glucose-Capillary: 159 mg/dL — ABNORMAL HIGH (ref 70–99)

## 2019-02-28 LAB — NOVEL CORONAVIRUS, NAA (HOSP ORDER, SEND-OUT TO REF LAB; TAT 18-24 HRS): SARS-CoV-2, NAA: NOT DETECTED

## 2019-02-28 NOTE — Discharge Summary (Signed)
Physician Discharge Summary  Miranda Roman RWE:[id number] DOB: [birth year] DOA: 02/26/2019  PCP: Glendale Chard, MD  Admit date: 02/26/2019 Discharge date: 02/28/2019  Admitted From: Home  Disposition:  Home   Recommendations for Outpatient Follow-up:  1. Follow up with Orthopedics Dr. Mina Marble this Friday 2. Follow up with PCP as needed    Home Health: None  Equipment/Devices: None  Discharge Condition: Fair  CODE STATUS: FULL Diet recommendation: Per prior  Brief/Interim Summary: Miranda Roman is a 73 y.o. F with CKD IV baseline Cr 2.4-3 followed by Nephrology, IDDM, and HTN who presented with 1 day severe low back pain with nausea.  In the ER, CT abdomen and pelvis was interpreted as "not strictly excluding metastatic disease" and so an MRI was done that ruled out bony lesions or metastatic disease, showed only left L3-4 facet arthritis, which was thought to be the source of her pain.    As her pain was poorly controlled even after 16 mg IV morphine, Ativan and Robaxin, the hospitalist service were asked to place in observation for further pain control.      PRINCIPAL HOSPITAL DIAGNOSIS: Low back pain    Discharge Diagnoses:   Low back pain from facet arthritis Treated with acetaminophen, IV morphine, Robaxin. 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  Evaluated by OT who recommended no particular therapy follow up at this time. -Discussed with Orthopedics, referred to Dr. Mina Marble on Friday for consideration of facet joint injection  Renal mass This is a known finding.  The patient has had MRI in February characterizing this lesion, and is in surveillance with her PCP (for this and pancreatic lesions).  CKD IV Creatinine at baseline.        Discharge Instructions  Discharge Instructions    Diet general   Complete by: As directed    Discharge instructions   Complete by: As  directed    From Dr. Loleta Books: Your MRI showed "facet arthritis" which is a common form of arthritis of the spine.  Thankfully, there is no pressure to the nerves or spinal cord, but this can be a very painful condition at times.  For now: use gentle stretches or a hot pad. Take acetaminophen 1000 mg (two extra strength tabs) up to three times per day. Follow up with Dr. Mina Marble on Friday at noon.  Resume all your other home medicines   Increase activity slowly   Complete by: As directed      Allergies as of 02/28/2019   No Known Allergies     Medication List    TAKE these medications   acetaminophen 500 MG tablet Commonly known as: TYLENOL Take 500 mg by mouth every 6 (six) hours as needed for moderate pain.   aspirin 81 MG tablet Take 81 mg by mouth daily.   atorvastatin 40 MG tablet Commonly known as: LIPITOR Take 40 mg by mouth daily.   benzonatate 100 MG capsule Commonly known as: Tessalon Perles Take 1 capsule (100 mg total) by mouth every 6 (six) hours as needed for cough.   calcitRIOL 0.25 MCG capsule Commonly known as: ROCALTROL Take 0.5-1 mcg by mouth daily. 2 tabs 1 day and 3 tabs the next day alternating 2 tabs and 3 tabs   dexlansoprazole 60 MG capsule Commonly known as: Dexilant Take 1 capsule (60 mg total) by mouth daily. What changed:   when to take this  reasons to take this  additional instructions  epoetin alfa 10000 UNIT/ML injection Commonly known as: EPOGEN 10,000 Units once. Takes every 2 weeks   glucose blood test strip Commonly known as: OneTouch Verio Use as instructed to check blood sugars 1 time per day e11.22   Insulin Detemir 100 UNIT/ML Pen Commonly known as: Levemir FlexTouch Inject 16 Units into the skin daily.   insulin lispro 100 UNIT/ML injection Commonly known as: HUMALOG Inject 0.04 mLs (4 Units total) into the skin 3 (three) times daily before meals. Sliding scale What changed: additional instructions   loratadine  10 MG tablet Commonly known as: Claritin Take 1 tablet (10 mg total) by mouth daily.   OneTouch Delica Lancets 16O Misc 1 each by Does not apply route daily. DX: E11.65   quinapril-hydrochlorothiazide 20-12.5 MG tablet Commonly known as: ACCURETIC Take 1/2 tablet by mouth daily What changed:   how much to take  how to take this  when to take this  additional instructions      Follow-up Information    Normajean Glasgow, MD Follow up.   Specialty: Physical Medicine and Rehabilitation Why: You have an appointment at noon on Friday Aug 28, arrive at 11:30AM. Contact information: [place] [address] [phone]          No Known Allergies  Consultations:  None   Procedures/Studies: Ct Abdomen Pelvis Wo Contrast  Result Date: 02/26/2019 CLINICAL DATA:  Abdominal pain EXAM: CT ABDOMEN AND PELVIS WITHOUT CONTRAST TECHNIQUE: Multidetector CT imaging of the abdomen and pelvis was performed following the standard protocol without IV contrast. COMPARISON:  MR abdomen, 08/27/2018 FINDINGS: Lower chest: No acute abnormality. Mild bibasilar scarring. Left coronary artery calcification. Hepatobiliary: No solid liver abnormality is seen. Small dependent gallstones and/or sludge. No gallbladder wall thickening or biliary dilatation. Pancreas: Unremarkable. Lesions described on prior MR examination are not appreciated by noncontrast CT. No pancreatic ductal dilatation or surrounding inflammatory changes. Spleen: Normal in size without significant abnormality. Adrenals/Urinary Tract: Adrenal glands are unremarkable. Kidneys are normal, without renal calculi, solid lesion, or hydronephrosis. Bladder is unremarkable. Stomach/Bowel: Status post Roux-en-Y gastric bypass. Appendix appears normal. No evidence of bowel wall thickening, distention, or inflammatory changes. Occasional colonic diverticula. Vascular/Lymphatic: Aortic atherosclerosis. No enlarged abdominal or pelvic lymph  nodes. Reproductive: No mass or other significant abnormality. Other: No abdominal wall hernia or abnormality. No abdominopelvic ascites. Musculoskeletal: There is mild osteopenia with ill-defined sclerosis of some vertebral bodies, particularly L4 and L5 (series 6, image 101, series 5, image 95). There is severe, serpiginous sclerosis of the right femoral head and to a lesser extent the left femoral head, consistent with a vascular necrosis. IMPRESSION: 1. No acute noncontrast CT findings of the abdomen or pelvis to explain back or abdominal pain or nausea. 2.  Postoperative findings of Roux-en-Y gastric bypass. 3. Small dependent gallstones and/or sludge. No gallbladder wall thickening or biliary dilatation. 4. There is mild osteopenia with ill-defined sclerosis of some vertebral bodies, particularly L4 and L5 (series 6, image 101, series 5, image 95). This is of uncertain significance and osseous metastatic disease is not strictly excluded. This can be further evaluated by contrast enhanced MRI if reported back pain is felt acutely referable. There is no evidence of fracture. 5. Pancreatic lesions described on prior MR examination are not appreciated by noncontrast CT. Electronically Signed   By: Eddie Candle M.D.   On: 02/26/2019 20:32   Mr Lumbar Spine Wo Contrast  Result Date: 02/26/2019 CLINICAL DATA:  Initial evaluation for acute low back pain  radiating into the abdomen for about 12 hours. Chronic kidney disease. EXAM: MRI LUMBAR SPINE WITHOUT CONTRAST TECHNIQUE: Multiplanar, multisequence MR imaging of the lumbar spine was performed. No intravenous contrast was administered. COMPARISON:  Prior CT of the abdomen and pelvis from earlier the same day. FINDINGS: Segmentation: Standard. Lowest well-formed disc labeled the L5-S1 level. Alignment: Mild levoscoliosis. Alignment otherwise normal with preservation of the normal lumbar lordosis. No listhesis or subluxation. Vertebrae: Vertebral body height  maintained without evidence for acute or chronic fracture. Bone marrow signal intensity diffusely heterogeneous, likely related to chronic renal disease. Superimposed benign hemangioma noted within the T12 vertebral body. No other discrete or worrisome osseous lesions. Mild reactive marrow edema noted about the left L3-4 facet due to facet arthritis (series 5, image 11). No other abnormal marrow edema. Conus medullaris and cauda equina: Conus extends to the L1 level. Conus and cauda equina appear normal. Paraspinal and other soft tissues: Paraspinous soft tissues demonstrate no acute finding. Few scattered T2 hyperintense renal cysts noted bilaterally. There is a 2 cm T1 hyperintense lesion at the lower pole of the left kidney (series 7, image 18). This is not visible on previous noncontrast abdominal CT. Visualized visceral structures otherwise unremarkable. Disc levels: T10-11: Unremarkable. T11-12: Mild disc bulge with disc desiccation. No stenosis or impingement. T12-L1: Unremarkable. L1-2:  Unremarkable. L2-3: Mild diffuse disc bulge with disc desiccation. Mild bilateral facet hypertrophy. No significant canal or foraminal stenosis. No impingement. L3-4: Disc desiccation with minimal annular disc bulge. Mild-to-moderate facet hypertrophy, greater on the left, with trace left-sided joint effusion and associated reactive marrow edema. No significant canal or lateral recess stenosis. Foramina remain patent. L4-5: Mild diffuse disc bulge with disc desiccation. Superimposed small central annular fissure. Mild bilateral facet hypertrophy. No significant canal or lateral recess stenosis. Mild right L4 foraminal narrowing. L5-S1: Chronic intervertebral disc space narrowing with diffuse disc bulge and disc desiccation. Mild reactive endplate changes. Superimposed mild bilateral facet hypertrophy. No significant canal or lateral recess stenosis. Foramina remain patent. IMPRESSION: 1. No acute abnormality within the  lumbar spine. 2. Mild for age multilevel degenerative disc disease as above. No focal disc herniation, significant stenosis, or evidence for neural impingement. 3. 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 Finding could contribute to lower back pain. 4. 2 cm T1 hyperintense lesion at the lower pole of the left kidney, indeterminate. While this finding could reflect a proteinaceous or hemorrhagic cyst, a possible renal neoplasm could also have this appearance. Finding is not well visualized on previous noncontrast abdominal CT. Given this, further assessment with dedicated renal ultrasound recommended for further characterization. If findings are equivocal, consideration could be made for further assessment with dedicated renal mass protocol CT. Electronically Signed   By: Jeannine Boga M.D.   On: 02/26/2019 23:37   Mm 3d Screen Breast Bilateral  Result Date: 02/04/2019 CLINICAL DATA:  Screening. EXAM: DIGITAL SCREENING BILATERAL MAMMOGRAM WITH TOMO AND CAD COMPARISON:  Previous exam(s). ACR Breast Density Category b: There are scattered areas of fibroglandular density. FINDINGS: There are no findings suspicious for malignancy. Images were processed with CAD. IMPRESSION: No mammographic evidence of malignancy. A result letter of this screening mammogram will be mailed directly to the patient. RECOMMENDATION: Screening mammogram in one year. (Code:SM-B-01Y) BI-RADS CATEGORY  1: Negative. Electronically Signed   By: Ammie Ferrier M.D.   On: 02/04/2019 15:36       Subjective: Feels improved.  No fever, vomiting, nausea.  No confusion.  No dyspnea.  Discharge Exam: Vitals:   02/27/19 2152 02/28/19 0631  BP: (!) 142/65 116/68  Pulse: 81 89  Resp: 16 18  Temp: 99.9 F (37.7 C) 98.9 F (37.2 C)  SpO2: 95% 97%   Vitals:   02/27/19 0951 02/27/19 1437 02/27/19 2152 02/28/19 0631  BP: (!) 144/69 (!) 157/88 (!) 142/65 116/68  Pulse: 87 85 81 89  Resp:  _0 Temp:  99.5 F (37.5 C) 99.9 F (37.7 C) 98.9 F (37.2 C)  TempSrc:  Oral Oral Oral  SpO2: 99% 98% 95% 97%    General: Pt is alert, awake, not in acute distress Cardiovascular: RRR, nl S1-S2, no murmurs appreciated.   No LE edema.   Respiratory: Normal respiratory rate and rhythm.  CTAB without rales or wheezes. Abdominal: Abdomen soft and non-tender.  No distension or HSM.   Neuro/Psych: Strength symmetric in upper and lower extremities.  Judgment and insight appear normal.  The leg strength is not limited by pain.   The results of significant diagnostics from this hospitalization (including imaging, microbiology, ancillary and laboratory) are listed below for reference.     Microbiology: Recent Results (from the past 240 hour(s))  Novel Coronavirus, NAA (hospital order; send-out to ref lab)     Status: None   Collection Time: 02/27/19  8:48 AM  Result Value Ref Range Status   SARS-CoV-2, NAA NOT DETECTED NOT DETECTED Final    Comment: (NOTE) This test was developed and its performance characteristics determined by [employer]. This test has not been FDA cleared or approved. This test has been authorized by FDA under an Emergency Use Authorization (EUA). This test is only authorized for the duration of time the declaration that circumstances exist justifying the authorization of the emergency use of in vitro diagnostic tests for detection of SARS-CoV-2 virus and/or diagnosis of COVID-19 infection under section 564(b)(1) of the Act, 21 U.S.C. 244CXF-0(H)(2), unless the authorization is terminated or revoked sooner. When diagnostic testing is negative, the possibility of a false negative result should be considered in the context of a patient's recent exposures and the presence of clinical signs and symptoms consistent with COVID-19. An individual without symptoms of COVID-19 and who is not shedding SARS-CoV-2 virus would expect to have a negative (not  detected) result in this assay. Performed  At: [facility] [place], [place] [id number] Rush Farmer MD FP:[id number]    [place]  Final    Comment: Performed at [facility] [address] [place], [place] [address]     Labs: BNP (last 3 results) No results for input(s): BNP in the last 8760 hours. Basic Metabolic Panel: Recent Labs  Lab 02/26/19 1819 02/27/19 0525  NA 134* 142  K 4.3 4.9  CL 106 112*  CO2 19* 19*  GLUCOSE 186* 251*  BUN 46* 40*  CREATININE 2.84* 2.42*  CALCIUM 8.8* 9.1   Liver Function Tests: Recent Labs  Lab 02/26/19 1819  AST 21  ALT 30  ALKPHOS 63  BILITOT 0.7  PROT 7.4  ALBUMIN 3.9   Recent Labs  Lab 02/26/19 1819  LIPASE 54*   No results for input(s): AMMONIA in the last 168 hours. CBC: Recent Labs  Lab 02/26/19 1819 02/27/19 0525  WBC 10.4 11.1*  HGB 11.4* 10.2*  HCT 36.4 33.4*  MCV 74.4* 73.9*  PLT 137* 118*   Cardiac Enzymes: No results for input(s): CKTOTAL, CKMB, CKMBINDEX, TROPONINI in the last 168 hours. BNP: Invalid input(s):  POCBNP CBG: Recent Labs  Lab 02/27/19 1136 02/27/19 1640 02/27/19 2114 02/28/19 0749 02/28/19 1156  GLUCAP 185* 194* 153* 140* 159*   D-Dimer No results for input(s): DDIMER in the last 72 hours. Hgb A1c No results for input(s): HGBA1C in the last 72 hours. Lipid Profile No results for input(s): CHOL, HDL, LDLCALC, TRIG, CHOLHDL, LDLDIRECT in the last 72 hours. Thyroid function studies No results for input(s): TSH, T4TOTAL, T3FREE, THYROIDAB in the last 72 hours.  Invalid input(s): FREET3 Anemia work up No results for input(s): VITAMINB12, FOLATE, FERRITIN, TIBC, IRON, RETICCTPCT in the last 72 hours. Urinalysis    Component Value Date/Time   COLORURINE STRAW (A) 02/26/2019 2119   APPEARANCEUR CLEAR 02/26/2019 2119   LABSPEC 1.009 02/26/2019 2119   PHURINE 5.0 02/26/2019 2119   GLUCOSEU NEGATIVE 02/26/2019  2119   HGBUR NEGATIVE 02/26/2019 2119   BILIRUBINUR NEGATIVE 02/26/2019 2119   BILIRUBINUR negative 09/16/2018 1309   KETONESUR 5 (A) 02/26/2019 2119   PROTEINUR 30 (A) 02/26/2019 2119   UROBILINOGEN 0.2 09/16/2018 1309   NITRITE NEGATIVE 02/26/2019 2119   LEUKOCYTESUR NEGATIVE 02/26/2019 2119   Sepsis Labs Invalid input(s): PROCALCITONIN,  WBC,  LACTICIDVEN Microbiology Recent Results (from the past 240 hour(s))  Novel Coronavirus, NAA (hospital order; send-out to ref lab)     Status: None   Collection Time: 02/27/19  8:48 AM  Result Value Ref Range Status   SARS-CoV-2, NAA NOT DETECTED NOT DETECTED Final    Comment: (NOTE) This test was developed and its performance characteristics determined by [employer]. This test has not been FDA cleared or approved. This test has been authorized by FDA under an Emergency Use Authorization (EUA). This test is only authorized for the duration of time the declaration that circumstances exist justifying the authorization of the emergency use of in vitro diagnostic tests for detection of SARS-CoV-2 virus and/or diagnosis of COVID-19 infection under section 564(b)(1) of the Act, 21 U.S.C. 347HSV-0(X)(4), unless the authorization is terminated or revoked sooner. When diagnostic testing is negative, the possibility of a false negative result should be considered in the context of a patient's recent exposures and the presence of clinical signs and symptoms consistent with COVID-19. An individual without symptoms of COVID-19 and who is not shedding SARS-CoV-2 virus would expect to have a negative (not detected) result in this assay. Performed  At: [facility] [place], [place] [id number] Rush Farmer MD [id number]    [place]  Final    Comment: Performed at [facility] [address], [place], [place] [address]     Time coordinating discharge: 40  minutes      SIGNED:   Edwin Dada, MD  [facility] 02/28/2019, 7:33 PM

## 2019-02-28 NOTE — Progress Notes (Signed)
PT Cancellation Note  Patient Details Name: Miranda Roman MRN: [id number] DOB: 25-Oct-1945   Cancelled Treatment:     PT order received but eval deferred.  Pt has been assessed by OT and screened for PT needs.  Pt with no PT needs at this time.  PT service to sign off.   Jaron Czarnecki 02/28/2019, 3:34 PM

## 2019-02-28 NOTE — Progress Notes (Signed)
OT spoke with me concerning pt and she said she was good to go home.  MD was notified.

## 2019-02-28 NOTE — Care Management Obs Status (Signed)
MEDICARE OBSERVATION STATUS NOTIFICATION   Patient Details  Name: Miranda Roman MRN: [id number] Date of Birth: 07-Mar-1946   Medicare Observation Status Notification Given:  Yes    Leeroy Cha, RN 02/28/2019, 3:06 PM

## 2019-02-28 NOTE — Evaluation (Signed)
Occupational Therapy Evaluation Patient Details Name: Miranda Roman MRN: [id number] DOB: Jun 27, 1946 Today's Date: 02/28/2019    History of Present Illness 73 y.o. female with medical history significant for CKD IV, DM, HLD, prior gastric bypass history,  HTN being admitted for intractable low back pain associated with nausea and vomiting.   Clinical Impression   OT eval and education complete. Education provided in regards to ADL activity and protecting her back  avoiding bending, twisting and arching) Pt verbalized understanding.     Follow Up Recommendations  No OT follow up    Equipment Recommendations  None recommended by OT    Recommendations for Other Services       Precautions / Restrictions Precautions Precautions: Back      Mobility Bed Mobility Overal bed mobility: Modified Independent                Transfers Overall transfer level: Modified independent                    Balance Overall balance assessment: No apparent balance deficits (not formally assessed)                                         ADL either performed or assessed with clinical judgement   ADL Overall ADL's : Modified independent                                             Vision Baseline Vision/History: No visual deficits              Pertinent Vitals/Pain Pain Assessment: No/denies pain     Hand Dominance     Extremity/Trunk Assessment Upper Extremity Assessment Upper Extremity Assessment: Overall WFL for tasks assessed           Communication Communication Communication: No difficulties   Cognition Arousal/Alertness: Awake/alert Behavior During Therapy: WFL for tasks assessed/performed Overall Cognitive Status: Within Functional Limits for tasks assessed                                                Home Living Family/patient expects to be discharged to:: Private residence Living  Arrangements: Alone Available Help at Discharge: Family Type of Home: House Home Access: Stairs to enter Technical brewer of Steps: 2   Home Layout: One level     Bathroom Shower/Tub: Tub/shower unit;Walk-in shower   Bathroom Toilet: Standard     Home Equipment: Environmental consultant - 2 wheels          Prior Functioning/Environment Level of Independence: Independent                          OT Goals(Current goals can be found in the care plan section) Acute Rehab OT Goals Patient Stated Goal: home today OT Goal Formulation: With patient  OT Frequency:      AM-PAC OT "6 Clicks" Daily Activity     Outcome Measure Help from another person eating meals?: None Help from another person taking care of personal grooming?: None Help from another person toileting, which includes using toliet, bedpan, or urinal?: None Help from  another person bathing (including washing, rinsing, drying)?: None Help from another person to put on and taking off regular upper body clothing?: None Help from another person to put on and taking off regular lower body clothing?: None 6 Click Score: 24   End of Session Nurse Communication: Mobility status  Activity Tolerance: Patient tolerated treatment well;No increased pain Patient left: in chair                   Time: 0152-0209 OT Time Calculation (min): 17 min Charges:  OT General Charges $OT Visit: 1 Visit OT Evaluation $OT Eval Low Complexity: 1 Low  Kari Baars, OT Acute Rehabilitation Services [phone] Office- [phone], Edwena Felty D 02/28/2019, 3:07 PM

## 2019-02-28 NOTE — Progress Notes (Signed)
Pt alert and oriented. D/C instructions given, all questions answered. Pt d/cd to home.

## 2019-02-28 NOTE — Plan of Care (Signed)
All goals met for d/c.

## 2019-03-01 ENCOUNTER — Ambulatory Visit (HOSPITAL_COMMUNITY)
Admission: RE | Admit: 2019-03-01 | Discharge: 2019-03-01 | Disposition: A | Discharge status: Home / Self Care | Payer: Medicare Other | Source: Ambulatory Visit | Attending: Nephrology | Admitting: Nephrology

## 2019-03-01 ENCOUNTER — Telehealth: Payer: Self-pay

## 2019-03-01 ENCOUNTER — Other Ambulatory Visit: Payer: Self-pay

## 2019-03-01 VITALS — BP 111/67 | HR 91 | Temp 96.6°F | Resp 18

## 2019-03-01 DIAGNOSIS — E1122 Type 2 diabetes mellitus with diabetic chronic kidney disease: Secondary | ICD-10-CM | POA: Diagnosis not present

## 2019-03-01 DIAGNOSIS — N184 Chronic kidney disease, stage 4 (severe): Secondary | ICD-10-CM | POA: Insufficient documentation

## 2019-03-01 MED ORDER — EPOETIN ALFA 40000 UNIT/ML IJ SOLN: 30000.0000 [IU] | INTRAMUSCULAR | Status: DC

## 2019-03-01 MED ORDER — EPOETIN ALFA 10000 UNIT/ML IJ SOLN
INTRAMUSCULAR | Status: AC
  Administered 2019-03-01: 10000 [IU]
  Filled 2019-03-01: qty 1

## 2019-03-01 MED ORDER — EPOETIN ALFA 20000 UNIT/ML IJ SOLN
INTRAMUSCULAR | Status: AC
  Administered 2019-03-01: 20000 [IU]
  Filled 2019-03-01: qty 1

## 2019-03-01 NOTE — Telephone Encounter (Signed)
Transition Care Management Follow-up Telephone Call  Date of discharge and from where: [id number]  How have you been since you were released from the hospital? "fine perfectly fine"  Any questions or concerns? no  Items Reviewed:  Did the pt receive and understand the discharge instructions provided? yes  Medications obtained and verified? yes  Any new allergies since your discharge? no  Dietary orders reviewed? yes  Do you have support at home? yes  Other (ie: DME, Home Health, etc) none  Functional Questionnaire: (I = Independent and D = Dependent) ADL's: i  Bathing/Dressing- i   Meal Prep-i  Eating- i  Maintaining continence- i  Transferring/Ambulation- i  Managing Meds- i  Follow up appointments reviewed:    PCP Hospital f/u appt confirmed? yes Scheduled to see sylvia on [phone]@ 12.  [facility] f/u appt confirmed? yes Scheduled to see dr Carlena Bjornstad on [phone] noon  Are transportation arrangements needed? no  If their condition worsens, is the pt aware to call  their PCP or go to the ED? no  Was the patient provided with contact information for the PCP's office or ED? yes Was the pt encouraged to call back with questions or concerns? yes

## 2019-03-03 ENCOUNTER — Ambulatory Visit (INDEPENDENT_AMBULATORY_CARE_PROVIDER_SITE_OTHER): Payer: Medicare Other | Admitting: Internal Medicine

## 2019-03-03 ENCOUNTER — Other Ambulatory Visit: Payer: Self-pay

## 2019-03-03 ENCOUNTER — Encounter: Payer: Self-pay | Admitting: Internal Medicine

## 2019-03-03 VITALS — BP 132/80 | HR 97 | Temp 98.5°F | Ht 64.8 in | Wt 181.4 lb

## 2019-03-03 DIAGNOSIS — R937 Abnormal findings on diagnostic imaging of other parts of musculoskeletal system: Secondary | ICD-10-CM | POA: Diagnosis not present

## 2019-03-03 DIAGNOSIS — M545 Low back pain, unspecified: Secondary | ICD-10-CM

## 2019-03-03 NOTE — Progress Notes (Signed)
Subjective:     Patient ID: Miranda Roman , female    DOB: 1945/11/16 , 73 y.o.   MRN: [id number]   Chief Complaint  Patient presents with  . Back Pain    HPI  Was in ER 8/22 for severe midline lower back pain which she had not had in 30 years. Was referred by ortho whom she will see  tomorrow.  Her back pain is 99% better. Has L lateral hip pain that gets worse if she lays down on her L side too long and has to shift weight off of it when sleeping.    Past Medical History:  Diagnosis Date  . Anemia   . Diabetes mellitus   . Hyperlipidemia   . Hypertension   . Osteoporosis   . Sickle cell anemia (HCC)      Family History  Problem Relation Age of Onset  . Diabetes Mother   . Heart disease Mother   . Diabetes Father   . Heart disease Father   . Diabetes Sister   . Diabetes Maternal Aunt   . Diabetes Maternal Uncle   . Diabetes Maternal Grandmother   . Diabetes Maternal Grandfather      Current Outpatient Medications:  .  acetaminophen (TYLENOL) 500 MG tablet, Take 500 mg by mouth every 6 (six) hours as needed for moderate pain., Disp: , Rfl:  .  aspirin 81 MG tablet, Take 81 mg by mouth daily., Disp: , Rfl:  .  atorvastatin (LIPITOR) 40 MG tablet, Take 40 mg by mouth daily., Disp: , Rfl:  .  calcitRIOL (ROCALTROL) 0.25 MCG capsule, Take 0.5-1 mcg by mouth daily. 2 tabs 1 day and 3 tabs the next day alternating 2 tabs and 3 tabs, Disp: , Rfl:  .  dexlansoprazole (DEXILANT) 60 MG capsule, Take 1 capsule (60 mg total) by mouth daily. (Patient taking differently: Take 60 mg by mouth daily as needed. Heart burn), Disp: 30 capsule, Rfl: 0 .  epoetin alfa (EPOGEN,PROCRIT) 62952 UNIT/ML injection, 10,000 Units once. Takes every 2 weeks, Disp: , Rfl:  .  glucose blood (ONETOUCH VERIO) test strip, Use as instructed to check blood sugars 1 time per day e11.22, Disp: 100 each, Rfl: 11 .  Insulin Detemir (LEVEMIR FLEXTOUCH) 100 UNIT/ML Pen, Inject 16 Units into the skin daily.,  Disp: 15 pen, Rfl: 1 .  insulin lispro (HUMALOG) 100 UNIT/ML injection, Inject 0.04 mLs (4 Units total) into the skin 3 (three) times daily before meals. Sliding scale (Patient taking differently: Inject 4 Units into the skin 3 (three) times daily before meals. ), Disp: 10 mL, Rfl: 2 .  loratadine (CLARITIN) 10 MG tablet, Take 1 tablet (10 mg total) by mouth daily., Disp: 30 tablet, Rfl: 2 .  ONETOUCH DELICA LANCETS 84X MISC, 1 each by Does not apply route daily. DX: E11.65, Disp: 150 each, Rfl: 11 .  quinapril-hydrochlorothiazide (ACCURETIC) 20-12.5 MG tablet, Take 1/2 tablet by mouth daily (Patient taking differently: Take 0.5 tablets by mouth daily. ), Disp: 90 tablet, Rfl: 1 .  benzonatate (TESSALON PERLES) 100 MG capsule, Take 1 capsule (100 mg total) by mouth every 6 (six) hours as needed for cough. (Patient not taking: Reported on 12/28/2018), Disp: 30 capsule, Rfl: 1   No Known Allergies   Review of Systems  Denies BM and bladder incontinence when she had the back pain, denies weakness or numbness. Denies dysuria or frequency. Gets urgency when her bladder gets too full.  Today's Vitals   03/03/19 1214  BP: 132/80  Pulse: 97  Temp: 98.5 F (36.9 C)  TempSrc: Oral  SpO2: 97%  Weight: 181 lb 6.4 oz (82.3 kg)  Height: 5' 4.8" (1.646 m)   Body mass index is 30.37 kg/m.   Objective:  Physical Exam Vitals signs and nursing note reviewed.  Constitutional:      General: She is not in acute distress.    Appearance: Normal appearance. She is not ill-appearing, toxic-appearing or diaphoretic.  HENT:     Left Ear: External ear normal.  Eyes:     General: No scleral icterus.    Conjunctiva/sclera: Conjunctivae normal.  Neck:     Musculoskeletal: Neck supple.  Pulmonary:     Effort: Pulmonary effort is normal.  Abdominal:     Tenderness: There is no right CVA tenderness or left CVA tenderness.  Musculoskeletal: Normal range of motion.     Right lower leg: No edema.     Left lower  leg: No edema.  Skin:    General: Skin is warm.     Findings: No rash.  Neurological:     Mental Status: She is alert and oriented to person, place, and time.     Gait: Gait normal.     Comments: Has hyporeflexia of lower extremities bilaterally.   Psychiatric:        Mood and Affect: Mood normal.        Behavior: Behavior normal.        Thought Content: Thought content normal.        Judgment: Judgment normal.     MRI report: IMPRESSION: 1. No acute abnormality within the lumbar spine. 2. Mild for age multilevel degenerative disc disease as above. No focal disc herniation, significant stenosis, or evidence for neural impingement. 3. 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 Finding could contribute to lower back pain. 4. 2 cm T1 hyperintense lesion at the lower pole of the left kidney, indeterminate. While this finding could reflect a proteinaceous or hemorrhagic cyst, a possible renal neoplasm could also have this appearance. Finding is not well visualized on previous noncontrast abdominal CT. Given this, further assessment with dedicated renal ultrasound recommended for further characterization. If findings are equivocal, consideration could be made for further assessment with dedicated renal mass protocol CT.    Assessment And Plan:   1. Acute midline low back pain without sciatica- improved.     Encouraged to keep apt with ortho.  2- Abnormal MRI- suspicious area noted on L lower renal pole- renal US recommended by radiologist which I ordered. We will inform her of the results when they are back. If neg, it was suggested by radiology to to renal mass protocol.  FU as scheduled to see PCP   Aspyn Warnke RODRIGUEZ-SOUTHWORTH, PA-C    THE PATIENT IS ENCOURAGED TO PRACTICE SOCIAL DISTANCING DUE TO THE COVID-19 PANDEMIC.

## 2019-03-04 DIAGNOSIS — M47816 Spondylosis without myelopathy or radiculopathy, lumbar region: Secondary | ICD-10-CM | POA: Diagnosis not present

## 2019-03-08 ENCOUNTER — Other Ambulatory Visit: Payer: Self-pay | Admitting: Internal Medicine

## 2019-03-09 ENCOUNTER — Ambulatory Visit
Admission: RE | Admit: 2019-03-09 | Discharge: 2019-03-09 | Disposition: A | Discharge status: Home / Self Care | Payer: Medicare Other | Source: Ambulatory Visit | Attending: Internal Medicine | Admitting: Internal Medicine

## 2019-03-09 ENCOUNTER — Other Ambulatory Visit: Payer: Self-pay | Admitting: Internal Medicine

## 2019-03-09 DIAGNOSIS — N281 Cyst of kidney, acquired: Secondary | ICD-10-CM | POA: Diagnosis not present

## 2019-03-09 DIAGNOSIS — R937 Abnormal findings on diagnostic imaging of other parts of musculoskeletal system: Secondary | ICD-10-CM

## 2019-03-10 ENCOUNTER — Other Ambulatory Visit: Payer: Self-pay | Admitting: Internal Medicine

## 2019-03-10 ENCOUNTER — Encounter: Payer: Self-pay | Admitting: Internal Medicine

## 2019-03-10 DIAGNOSIS — N281 Cyst of kidney, acquired: Secondary | ICD-10-CM

## 2019-03-10 HISTORY — DX: Cyst of kidney, acquired: N28.1

## 2019-03-16 ENCOUNTER — Ambulatory Visit (HOSPITAL_COMMUNITY)
Admission: RE | Admit: 2019-03-16 | Discharge: 2019-03-16 | Disposition: A | Discharge status: Home / Self Care | Payer: Medicare Other | Source: Ambulatory Visit | Attending: Nephrology | Admitting: Nephrology

## 2019-03-16 ENCOUNTER — Other Ambulatory Visit: Payer: Self-pay

## 2019-03-16 VITALS — BP 139/72 | HR 88 | Temp 96.4°F | Resp 20

## 2019-03-16 DIAGNOSIS — Z7189 Other specified counseling: Secondary | ICD-10-CM | POA: Diagnosis not present

## 2019-03-16 DIAGNOSIS — N184 Chronic kidney disease, stage 4 (severe): Secondary | ICD-10-CM | POA: Insufficient documentation

## 2019-03-16 DIAGNOSIS — Z1159 Encounter for screening for other viral diseases: Secondary | ICD-10-CM | POA: Diagnosis not present

## 2019-03-16 DIAGNOSIS — D631 Anemia in chronic kidney disease: Secondary | ICD-10-CM | POA: Diagnosis not present

## 2019-03-16 DIAGNOSIS — E1122 Type 2 diabetes mellitus with diabetic chronic kidney disease: Secondary | ICD-10-CM | POA: Diagnosis not present

## 2019-03-16 DIAGNOSIS — I129 Hypertensive chronic kidney disease with stage 1 through stage 4 chronic kidney disease, or unspecified chronic kidney disease: Secondary | ICD-10-CM | POA: Diagnosis not present

## 2019-03-16 DIAGNOSIS — N179 Acute kidney failure, unspecified: Secondary | ICD-10-CM | POA: Diagnosis not present

## 2019-03-16 LAB — POCT HEMOGLOBIN-HEMACUE: Hemoglobin: 10.4 g/dL — ABNORMAL LOW (ref 12.0–15.0)

## 2019-03-16 MED ORDER — EPOETIN ALFA 20000 UNIT/ML IJ SOLN
INTRAMUSCULAR | Status: AC
  Administered 2019-03-16: 20000 [IU]
  Filled 2019-03-16: qty 1

## 2019-03-16 MED ORDER — EPOETIN ALFA 40000 UNIT/ML IJ SOLN: 30000.0000 [IU] | INTRAMUSCULAR | Status: DC

## 2019-03-16 MED ORDER — EPOETIN ALFA 10000 UNIT/ML IJ SOLN
INTRAMUSCULAR | Status: AC
  Administered 2019-03-16: 10000 [IU]
  Filled 2019-03-16: qty 1

## 2019-03-21 ENCOUNTER — Ambulatory Visit (INDEPENDENT_AMBULATORY_CARE_PROVIDER_SITE_OTHER): Payer: Medicare Other | Admitting: Internal Medicine

## 2019-03-21 ENCOUNTER — Other Ambulatory Visit: Payer: Self-pay

## 2019-03-21 VITALS — BP 124/68 | HR 74 | Ht 64.0 in | Wt 186.0 lb

## 2019-03-21 DIAGNOSIS — I2510 Atherosclerotic heart disease of native coronary artery without angina pectoris: Secondary | ICD-10-CM

## 2019-03-21 DIAGNOSIS — I2583 Coronary atherosclerosis due to lipid rich plaque: Secondary | ICD-10-CM

## 2019-03-21 DIAGNOSIS — E785 Hyperlipidemia, unspecified: Secondary | ICD-10-CM

## 2019-03-21 DIAGNOSIS — I10 Essential (primary) hypertension: Secondary | ICD-10-CM

## 2019-03-21 DIAGNOSIS — I351 Nonrheumatic aortic (valve) insufficiency: Secondary | ICD-10-CM | POA: Diagnosis not present

## 2019-03-21 NOTE — Patient Instructions (Signed)
Medication Instructions:  Your physician recommends that you continue on your current medications as directed. Please refer to the Current Medication list given to you today.  If you need a refill on your cardiac medications before your next appointment, please call your pharmacy.   Lab work: FASTING lab work to check cholesterol   If you have labs (blood work) drawn today and your tests are completely normal, you will receive your results only by: [name] MyChart Message (if you have MyChart) OR . A paper copy in the mail If you have any lab test that is abnormal or we need to change your treatment, we will call you to review the results.   Follow-Up: At [facility], you and your health needs are our priority.  As part of our continuing mission to provide you with exceptional heart care, we have created designated Provider Care Teams.  These Care Teams include your primary Cardiologist (physician) and Advanced Practice Providers (APPs -  Physician Assistants and Nurse Practitioners) who all work together to provide you with the care you need, when you need it. You will need a follow up appointment in 12 months.  Please call our office 2 months in advance to schedule this appointment.  You may see Dr. Debara Pickett or one of the following Advanced Practice Providers on your designated Care Team: Almyra Deforest, [place] . Fabian Sharp, PA-C  Any Other Special Instructions Will Be Listed Below (If Applicable).

## 2019-03-21 NOTE — Progress Notes (Signed)
OFFICE NOTE  Chief Complaint:  Follow-up  Primary Care Physician: Glendale Chard, MD  HPI:  Miranda Roman is a 73 year old female with a history of mild coronary disease by cath in 2007 at the ostium of the left circumflex, also has insulin dependent diabetes, hypertension, dyslipidemia, and morbid obesity; however, she underwent weight loss surgery and had significant improvement in her weight. Recently she has been struggling with a sinus infection, is on some prednisone. Blood pressure was elevated a little bit today at 140/70; however, she also had not taken her blood pressure medication.   Miranda Roman returns today and is without complaints. She denies any shortness of breath, chest pain or palpitations .  I had the pleasure of seeing Miranda Roman back in the office today. Recently she was seen by her primary care provider and had complained of some chest pain. This was occurring over a couple of days and noted mostly at rest however sometimes with exertion. She reports not being particularly active. She says it is since resolved and she's not had recurrence. It was not necessarily associated with eating or similar to her reflux symptoms in the past. She did not take medication for it. Her last stress test was in 2010. She does have mild coronary artery disease based on catheterization in 2007.  02/11/2016  Miranda Roman returns today for follow-up. She has no chest pain or worsening shortness of breath. In general she's done fairly well. Blood pressures been well controlled at home and was 131/84 today which she says is "high" for her. She's had no problems with atorvastatin. She takes quinapril/HCTZ. She recently had blood work through her primary care provider in a complete physical and we'll request those numbers. We are monitoring long-term for aortic insufficiency which was seen on her last echo in August 2016.  08/26/2016  Miranda Roman returns today at the request of Dr.  Baird Cancer for reevaluation of chest pain. She has had similar pain like this in the past. She underwent nuclear stress test in 2016 which was low risk and negative for ischemia. Recently some of her symptoms seem to be worse in exaggerated by eating foods. She had been previously told that she had reflux symptoms but was not on any medications. She does have a history of gastric bypass surgery. I wonder if she could have a hiatal hernia that is acting up. She was started on omeprazole last week and notes that she's not had any further symptoms since then.  02/24/2017  Miranda Roman was seen today in follow-up. I last saw her in February where she had some pain in the back of her chest which radiated around to the front. She says that ultimately she lightened up her purse and she noted improvement in her symptoms. It could be that she had thoracic radiculopathy. She just had a repeat echo which shows a stable normal LVEF and mild aortic insufficiency. She denies any recurrent chest pain symptoms.  03/02/2018  Miranda Roman is seen today in routine annual follow-up.  Over the past year she denies any new chest pain or worsening shortness of breath.  She has had issues with pain in the left shoulder and neck at times which was thought to be due to radiculopathy.  Echo had shown mild aortic insufficiency in the past.  Blood pressures well controlled today.  Lab work in April 2019 showed total cholesterol 131, HDL 42, LDL 74 and triglycerides 77.  Hemoglobin A1c is above target at 8.5.  03/21/2019  Miranda Roman returns today for follow-up.  She denies any chest pain or worsening shortness of breath.  She is due for repeat lipid profile which will be drawn today.  Goal LDL is less than 70.  Blood pressure is at goal today.  PMHx:  Past Medical History:  Diagnosis Date  . Anemia   . Diabetes mellitus   . Hyperlipidemia   . Hypertension   . Osteoporosis   . Sickle cell anemia (HCC)     Past Surgical  History:  Procedure Laterality Date  . BONE RESECTION  01/2006   wrist  . 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 TUBAL LIGATION  04/28/1977    FAMHx:  Family History  Problem Relation Age of Onset  . Diabetes Mother   . Heart disease Mother   . Diabetes Father   . Heart disease Father   . Diabetes Sister   . Diabetes Maternal Aunt   . Diabetes Maternal Uncle   . Diabetes Maternal Grandmother   . Diabetes Maternal Grandfather     SOCHx:   reports that she quit smoking about 13 years ago. Her smoking use included cigarettes. She has a 10.00 pack-year smoking history. She has never used smokeless tobacco. She reports current alcohol use. She reports that she does not use drugs.  ALLERGIES:  No Known Allergies  ROS: Pertinent items noted in HPI and remainder of comprehensive ROS otherwise negative.  HOME MEDS: Current Outpatient Medications  Medication Sig Dispense Refill  . acetaminophen (TYLENOL) 500 MG tablet Take 500 mg by mouth every 6 (six) hours as needed for moderate pain.    [name] aspirin 81 MG tablet Take 81 mg by mouth daily.    [name] atorvastatin (LIPITOR) 40 MG tablet Take 40 mg by mouth daily.    . calcitRIOL (ROCALTROL) 0.25 MCG capsule Take 0.5-1 mcg by mouth daily. 2 tabs 1 day and 3 tabs the next day alternating 2 tabs and 3 tabs    . dexlansoprazole (DEXILANT) 60 MG capsule Take 1 capsule (60 mg total) by mouth daily. (Patient taking differently: Take 60 mg by mouth daily as needed. Heart burn) 30 capsule 0  . epoetin alfa (EPOGEN,PROCRIT) 49702 UNIT/ML injection 10,000 Units once. Takes every 2 weeks    . glucose blood (ONETOUCH VERIO) test strip Use as instructed to check blood sugars 1 time per day e11.22 100 each 11  . Insulin Detemir (LEVEMIR FLEXTOUCH) 100 UNIT/ML Pen Inject 16 Units into the skin daily. 15 pen 1  . insulin lispro (HUMALOG) 100 UNIT/ML injection Inject 0.04 mLs (4 Units total) into the skin 3 (three) times daily before meals. Sliding scale (Patient  taking differently: Inject 4 Units into the skin 3 (three) times daily before meals. ) 10 mL 2  . loratadine (CLARITIN) 10 MG tablet Take 1 tablet (10 mg total) by mouth daily. 30 tablet 2  . ONETOUCH DELICA LANCETS 63Z MISC 1 each by Does not apply route daily. DX: E11.65 150 each 11  . quinapril (ACCUPRIL) 10 MG tablet Take 10 mg by mouth daily.     No current facility-administered medications for this visit.     LABS/IMAGING: No results found for this or any previous visit (from the past 48 hour(s)). No results found.  VITALS: BP 124/68   Pulse 74   Ht _0  (1.626 m)   Wt 186 lb (84.4 kg)   BMI 31.93 kg/m   EXAM: General appearance: alert and no distress Neck: no adenopathy, no  carotid bruit, no JVD, supple, symmetrical, trachea midline and thyroid not enlarged, symmetric, no tenderness/mass/nodules Lungs: clear to auscultation bilaterally Heart: regular rate and rhythm, S1, S2 normal and diastolic murmur: early diastolic 3/6, blowing at 2nd right intercostal space Abdomen: soft, non-tender; bowel sounds normal; no masses,  no organomegaly Extremities: extremities normal, atraumatic, no cyanosis or edema Pulses: 2+ and symmetric Skin: Skin color, texture, turgor normal. No rashes or lesions Neurologic: Grossly normal  EKG: Normal sinus rhythm at 74-personally reviewed  ASSESSMENT: 1. Chest pain at rest- low risk myoview 2016 - may have been a thoracic radiculopathy 2. History of morbid obesity status post bariatric surgery 3. Diabetes, no longer insulin-dependent 4. Hypertension - managed by primary 5. 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  Ms. Kreiter continues to do well without any worsening shortness of breath or chest pain.  Her murmur is stable.  Blood pressures well controlled.  She is due for repeat lipid profile which we will recheck today.  Plan follow-up with me annually or sooner as necessary.  Pixie Casino, MD, [facility], [place] Director of the [facility]  [facility] Diplomate of the American Board of Clinical Lipidology Attending Cardiologist  Direct Dial: [phone]  Fax: [phone]  Website:  [email or web address] Amer Alcindor 03/21/2019, 11:03 AM

## 2019-03-22 ENCOUNTER — Encounter: Payer: Self-pay | Admitting: Podiatry

## 2019-03-22 ENCOUNTER — Ambulatory Visit (INDEPENDENT_AMBULATORY_CARE_PROVIDER_SITE_OTHER): Payer: Medicare Other | Admitting: Podiatry

## 2019-03-22 ENCOUNTER — Encounter: Payer: Self-pay | Admitting: Internal Medicine

## 2019-03-22 DIAGNOSIS — B351 Tinea unguium: Secondary | ICD-10-CM | POA: Diagnosis not present

## 2019-03-22 DIAGNOSIS — M79675 Pain in left toe(s): Secondary | ICD-10-CM

## 2019-03-22 DIAGNOSIS — N184 Chronic kidney disease, stage 4 (severe): Secondary | ICD-10-CM

## 2019-03-22 DIAGNOSIS — L84 Corns and callosities: Secondary | ICD-10-CM

## 2019-03-22 DIAGNOSIS — E1122 Type 2 diabetes mellitus with diabetic chronic kidney disease: Secondary | ICD-10-CM | POA: Diagnosis not present

## 2019-03-22 DIAGNOSIS — M79674 Pain in right toe(s): Secondary | ICD-10-CM | POA: Diagnosis not present

## 2019-03-22 DIAGNOSIS — E785 Hyperlipidemia, unspecified: Secondary | ICD-10-CM | POA: Diagnosis not present

## 2019-03-22 LAB — LIPID PANEL
Chol/HDL Ratio: 2.7 ratio (ref 0.0–4.4)
Cholesterol, Total: 130 mg/dL (ref 100–199)
HDL: 48 mg/dL (ref >39)
LDL Chol Calc (NIH): 65 mg/dL (ref 0–99)
Triglycerides: 89 mg/dL (ref 0–149)
VLDL Cholesterol Cal: 17 mg/dL (ref 5–40)

## 2019-03-22 NOTE — Patient Instructions (Signed)
Diabetes Mellitus and Foot Care Foot care is an important part of your health, especially when you have diabetes. Diabetes may cause you to have problems because of poor blood flow (circulation) to your feet and legs, which can cause your skin to:  Become thinner and drier.  Break more easily.  Heal more slowly.  Peel and crack. You may also have nerve damage (neuropathy) in your legs and feet, causing decreased feeling in them. This means that you may not notice minor injuries to your feet that could lead to more serious problems. Noticing and addressing any potential problems early is the best way to prevent future foot problems. How to care for your feet Foot hygiene  Wash your feet daily with warm water and mild soap. Do not use hot water. Then, pat your feet and the areas between your toes until they are completely dry. Do not soak your feet as this can dry your skin.  Trim your toenails straight across. Do not dig under them or around the cuticle. File the edges of your nails with an emery board or nail file.  Apply a moisturizing lotion or petroleum jelly to the skin on your feet and to dry, brittle toenails. Use lotion that does not contain alcohol and is unscented. Do not apply lotion between your toes. Shoes and socks  Wear clean socks or stockings every day. Make sure they are not too tight. Do not wear knee-high stockings since they may decrease blood flow to your legs.  Wear shoes that fit properly and have enough cushioning. Always look in your shoes before you put them on to be sure there are no objects inside.  To break in new shoes, wear them for just a few hours a day. This prevents injuries on your feet. Wounds, scrapes, corns, and calluses  Check your feet daily for blisters, cuts, bruises, sores, and redness. If you cannot see the bottom of your feet, use a mirror or ask someone for help.  Do not cut corns or calluses or try to remove them with medicine.  If you  find a minor scrape, cut, or break in the skin on your feet, keep it and the skin around it clean and dry. You may clean these areas with mild soap and water. Do not clean the area with peroxide, alcohol, or iodine.  If you have a wound, scrape, corn, or callus on your foot, look at it several times a day to make sure it is healing and not infected. Check for: ? Redness, swelling, or pain. ? Fluid or blood. ? Warmth. ? Pus or a bad smell. General instructions  Do not cross your legs. This may decrease blood flow to your feet.  Do not use heating pads or hot water bottles on your feet. They may burn your skin. If you have lost feeling in your feet or legs, you may not know this is happening until it is too late.  Protect your feet from hot and cold by wearing shoes, such as at the beach or on hot pavement.  Schedule a complete foot exam at least once a year (annually) or more often if you have foot problems. If you have foot problems, report any cuts, sores, or bruises to your health care provider immediately. Contact a health care provider if:  You have a medical condition that increases your risk of infection and you have any cuts, sores, or bruises on your feet.  You have an injury that is not   healing.  You have redness on your legs or feet.  You feel burning or tingling in your legs or feet.  You have pain or cramps in your legs and feet.  Your legs or feet are numb.  Your feet always feel cold.  You have pain around a toenail. Get help right away if:  You have a wound, scrape, corn, or callus on your foot and: ? You have pain, swelling, or redness that gets worse. ? You have fluid or blood coming from the wound, scrape, corn, or callus. ? Your wound, scrape, corn, or callus feels warm to the touch. ? You have pus or a bad smell coming from the wound, scrape, corn, or callus. ? You have a fever. ? You have a red line going up your leg. Summary  Check your feet every day  for cuts, sores, red spots, swelling, and blisters.  Moisturize feet and legs daily.  Wear shoes that fit properly and have enough cushioning.  If you have foot problems, report any cuts, sores, or bruises to your health care provider immediately.  Schedule a complete foot exam at least once a year (annually) or more often if you have foot problems. This information is not intended to replace advice given to you by your health care provider. Make sure you discuss any questions you have with your health care provider. Document Released: 06/20/2000 Document Revised: 08/05/2017 Document Reviewed: 07/25/2016 Elsevier Patient Education  [place]? An infection that lies within the keratin of your nail plate that is caused by a fungus.  WHY ME? Fungal infections affect all ages, sexes, races, and creeds.  There may be many factors that predispose you to a fungal infection such as age, coexisting medical conditions such as diabetes, or an autoimmune disease; stress, medications, fatigue, genetics, etc.  Bottom line: fungus thrives in a warm, moist environment and your shoes offer such a location.  IS IT CONTAGIOUS? Theoretically, yes.  You do not want to share shoes, nail clippers or files with someone who has fungal toenails.  Walking around barefoot in the same room or sleeping in the same bed is unlikely to transfer the organism.  It is important to realize, however, that fungus can spread easily from one nail to the next on the same foot.  HOW DO WE TREAT THIS?  There are several ways to treat this condition.  Treatment may depend on many factors such as age, medications, pregnancy, liver and kidney conditions, etc.  It is best to ask your doctor which options are available to you.  1. No treatment.   Unlike many other medical concerns, you can live with this condition.  However for many people this can be a painful condition and may lead to  ingrown toenails or a bacterial infection.  It is recommended that you keep the nails cut short to help reduce the amount of fungal nail. 2. Topical treatment.  These range from herbal remedies to prescription strength nail lacquers.  About 40-50% effective, topicals require twice daily application for approximately 9 to 12 months or until an entirely new nail has grown out.  The most effective topicals are medical grade medications available through physicians offices. 3. Oral antifungal medications.  With an 80-90% cure rate, the most common oral medication requires 3 to 4 months of therapy and stays in your system for a year as the new nail grows out.  Oral antifungal medications do require  blood work to make sure it is a safe drug for you.  A liver function panel will be performed prior to starting the medication and after the first month of treatment.  It is important to have the blood work performed to avoid any harmful side effects.  In general, this medication safe but blood work is required. 4. Laser Therapy.  This treatment is performed by applying a specialized laser to the affected nail plate.  This therapy is noninvasive, fast, and non-painful.  It is not covered by insurance and is therefore, out of pocket.  The results have been very good with a 80-95% cure rate.  The [facility] is the only practice in the area to offer this therapy. 5. Permanent Nail Avulsion.  Removing the entire nail so that a new nail will not grow back.

## 2019-03-23 ENCOUNTER — Other Ambulatory Visit (INDEPENDENT_AMBULATORY_CARE_PROVIDER_SITE_OTHER): Payer: Medicare Other

## 2019-03-23 DIAGNOSIS — I10 Essential (primary) hypertension: Secondary | ICD-10-CM

## 2019-03-28 NOTE — Progress Notes (Signed)
Subjective: Miranda Roman is a 73 y.o. y.o. female who presents today with cc of painful, discolored, thick toenails and multiple corns/calluses which interfere with daily activities. Pain is aggravated when wearing enclosed shoe gear and relieved with periodic professional debridement.  She voices no new pedal problems on today's visit.   Current Outpatient Medications:  .  acetaminophen (TYLENOL) 500 MG tablet, Take 500 mg by mouth every 6 (six) hours as needed for moderate pain., Disp: , Rfl:  .  aspirin 81 MG tablet, Take 81 mg by mouth daily., Disp: , Rfl:  .  atorvastatin (LIPITOR) 40 MG tablet, Take 40 mg by mouth daily., Disp: , Rfl:  .  calcitRIOL (ROCALTROL) 0.25 MCG capsule, Take 0.5-1 mcg by mouth daily. 2 tabs 1 day and 3 tabs the next day alternating 2 tabs and 3 tabs, Disp: , Rfl:  .  dexlansoprazole (DEXILANT) 60 MG capsule, Take 1 capsule (60 mg total) by mouth daily. (Patient taking differently: Take 60 mg by mouth daily as needed. Heart burn), Disp: 30 capsule, Rfl: 0 .  epoetin alfa (EPOGEN,PROCRIT) 46962 UNIT/ML injection, 10,000 Units once. Takes every 2 weeks, Disp: , Rfl:  .  glucose blood (ONETOUCH VERIO) test strip, Use as instructed to check blood sugars 1 time per day e11.22, Disp: 100 each, Rfl: 11 .  Insulin Detemir (LEVEMIR FLEXTOUCH) 100 UNIT/ML Pen, Inject 16 Units into the skin daily., Disp: 15 pen, Rfl: 1 .  insulin lispro (HUMALOG) 100 UNIT/ML injection, Inject 0.04 mLs (4 Units total) into the skin 3 (three) times daily before meals. Sliding scale (Patient taking differently: Inject 4 Units into the skin 3 (three) times daily before meals. ), Disp: 10 mL, Rfl: 2 .  loratadine (CLARITIN) 10 MG tablet, Take 1 tablet (10 mg total) by mouth daily., Disp: 30 tablet, Rfl: 2 .  ONETOUCH DELICA LANCETS 95M MISC, 1 each by Does not apply route daily. DX: E11.65, Disp: 150 each, Rfl: 11 .  quinapril (ACCUPRIL) 10 MG tablet, Take 10 mg by mouth daily., Disp: , Rfl:    No Known Allergies  Objective: Vascular Examination: Capillary refill time immediate x 10 digits.  Dorsalis pedis pulses palpable b/l.  Posterior tibial pulses palpable b/l.  Digital hair absent x 10 digits.  Skin temperature gradient WNL b/l.  Dermatological Examination: Skin with normal turgor, texture and tone b/l.  Toenails 1-5 b/l discolored, thick, dystrophic with subungual debris and pain with palpation to nailbeds due to thickness of nails.  Hyperkeratotic lesions distal tip left 3rd digit. No erythema, no edema, no drainage, no flocculence noted.   Porokeratotic lesions submet head 1 right, submet heads 3 b/l, 4 b/l 5 left foot and lateral 5th metatarsal head right foot with tenderness to palpation. No erythema, no edema, no drainage, no flocculence.   Musculoskeletal: Muscle strength 5/5 to all LE muscle groups  Neurological: Sensation intact 5/5 b/l with 10 gram monofilament.  Vibratory sensation intact b/l.  Assessment: 1. Painful onychomycosis toenails 1-5 b/l 2.   Porokeratoses submet head 1 right, submet heads 3 b/l, 4 b/l 5 left foot and lateral 5th metatarsal head right foot 3.  Corn left 3rd digit 4.  NIDDM with CKD stage 4  Plan: 1. Continue diabetic foot care principles. Literature dispensed on today. 2. Toenails 1-5 b/l were debrided in length and girth without iatrogenic bleeding. 3. Corn left 3rd digit pared with sterile scalpel blade without incident. 4. Porokeratosissubmet head 1 right, submet heads 3 b/l, 4 b/l 5  left foot and lateral 5th metatarsal head right foot pared and enucleated with sterile scalpel blade without incident.   5. Patient to continue soft, supportive shoe gear daily. 6. Patient to report any pedal injuries to medical professional immediately. 7. Follow up 3 months.  8. Patient/POA to call should there be a concern in the interim.

## 2019-03-29 ENCOUNTER — Ambulatory Visit (HOSPITAL_COMMUNITY)
Admission: RE | Admit: 2019-03-29 | Discharge: 2019-03-29 | Disposition: A | Discharge status: Home / Self Care | Payer: Medicare Other | Source: Ambulatory Visit | Attending: Nephrology | Admitting: Nephrology

## 2019-03-29 ENCOUNTER — Other Ambulatory Visit: Payer: Self-pay

## 2019-03-29 VITALS — BP 136/79 | HR 95 | Temp 96.4°F | Resp 20

## 2019-03-29 DIAGNOSIS — E1122 Type 2 diabetes mellitus with diabetic chronic kidney disease: Secondary | ICD-10-CM | POA: Diagnosis not present

## 2019-03-29 DIAGNOSIS — N184 Chronic kidney disease, stage 4 (severe): Secondary | ICD-10-CM | POA: Diagnosis not present

## 2019-03-29 LAB — FERRITIN: Ferritin: 171 ng/mL (ref 11–307)

## 2019-03-29 LAB — IRON AND TIBC
Iron: 67 ug/dL (ref 28–170)
Saturation Ratios: 26 % (ref 10.4–31.8)
TIBC: 259 ug/dL (ref 250–450)
UIBC: 192 ug/dL

## 2019-03-29 LAB — POCT HEMOGLOBIN-HEMACUE: Hemoglobin: 10.6 g/dL — ABNORMAL LOW (ref 12.0–15.0)

## 2019-03-29 MED ORDER — EPOETIN ALFA 10000 UNIT/ML IJ SOLN
INTRAMUSCULAR | Status: AC
  Administered 2019-03-29: 10000 [IU] via SUBCUTANEOUS
  Filled 2019-03-29: qty 1

## 2019-03-29 MED ORDER — EPOETIN ALFA 10000 UNIT/ML IJ SOLN
10000.0000 [IU] | INTRAMUSCULAR | Status: DC
  Administered 2019-03-29: 09:00:00 10000 [IU] via SUBCUTANEOUS

## 2019-03-30 ENCOUNTER — Other Ambulatory Visit: Payer: Self-pay

## 2019-03-30 ENCOUNTER — Encounter (HOSPITAL_COMMUNITY): Payer: Medicare Other

## 2019-03-30 ENCOUNTER — Ambulatory Visit
Admission: RE | Admit: 2019-03-30 | Discharge: 2019-03-30 | Disposition: A | Discharge status: Home / Self Care | Payer: Medicare Other | Source: Ambulatory Visit | Attending: Internal Medicine | Admitting: Internal Medicine

## 2019-03-30 DIAGNOSIS — K862 Cyst of pancreas: Secondary | ICD-10-CM

## 2019-03-30 DIAGNOSIS — K8020 Calculus of gallbladder without cholecystitis without obstruction: Secondary | ICD-10-CM | POA: Diagnosis not present

## 2019-03-30 DIAGNOSIS — N2889 Other specified disorders of kidney and ureter: Secondary | ICD-10-CM

## 2019-04-06 ENCOUNTER — Other Ambulatory Visit: Payer: Self-pay

## 2019-04-06 ENCOUNTER — Ambulatory Visit
Admission: RE | Admit: 2019-04-06 | Discharge: 2019-04-06 | Disposition: A | Discharge status: Home / Self Care | Payer: Medicare Other | Source: Ambulatory Visit | Attending: Internal Medicine | Admitting: Internal Medicine

## 2019-04-06 DIAGNOSIS — Z780 Asymptomatic menopausal state: Secondary | ICD-10-CM | POA: Diagnosis not present

## 2019-04-06 DIAGNOSIS — E2839 Other primary ovarian failure: Secondary | ICD-10-CM

## 2019-04-06 DIAGNOSIS — M8589 Other specified disorders of bone density and structure, multiple sites: Secondary | ICD-10-CM | POA: Diagnosis not present

## 2019-04-12 ENCOUNTER — Ambulatory Visit (HOSPITAL_COMMUNITY)
Admission: RE | Admit: 2019-04-12 | Discharge: 2019-04-12 | Disposition: A | Discharge status: Home / Self Care | Payer: Medicare Other | Source: Ambulatory Visit | Attending: Nephrology | Admitting: Nephrology

## 2019-04-12 ENCOUNTER — Other Ambulatory Visit: Payer: Self-pay

## 2019-04-12 VITALS — BP 117/66 | HR 92 | Temp 96.5°F | Resp 20

## 2019-04-12 DIAGNOSIS — E1122 Type 2 diabetes mellitus with diabetic chronic kidney disease: Secondary | ICD-10-CM | POA: Diagnosis not present

## 2019-04-12 DIAGNOSIS — N184 Chronic kidney disease, stage 4 (severe): Secondary | ICD-10-CM | POA: Diagnosis not present

## 2019-04-12 LAB — POCT HEMOGLOBIN-HEMACUE: Hemoglobin: 10.8 g/dL — ABNORMAL LOW (ref 12.0–15.0)

## 2019-04-12 MED ORDER — EPOETIN ALFA 10000 UNIT/ML IJ SOLN
10000.0000 [IU] | INTRAMUSCULAR | Status: DC
  Administered 2019-04-12: 10000 [IU] via SUBCUTANEOUS

## 2019-04-14 ENCOUNTER — Encounter: Payer: Medicare Other | Admitting: Internal Medicine

## 2019-04-18 DIAGNOSIS — Z124 Encounter for screening for malignant neoplasm of cervix: Secondary | ICD-10-CM | POA: Diagnosis not present

## 2019-04-25 ENCOUNTER — Other Ambulatory Visit (HOSPITAL_COMMUNITY): Payer: Self-pay | Admitting: *Deleted

## 2019-04-26 ENCOUNTER — Other Ambulatory Visit: Payer: Self-pay

## 2019-04-26 ENCOUNTER — Ambulatory Visit (HOSPITAL_COMMUNITY)
Admission: RE | Admit: 2019-04-26 | Discharge: 2019-04-26 | Disposition: A | Discharge status: Home / Self Care | Payer: Medicare Other | Source: Ambulatory Visit | Attending: Nephrology | Admitting: Nephrology

## 2019-04-26 VITALS — BP 144/68 | HR 82 | Temp 96.8°F | Resp 20

## 2019-04-26 DIAGNOSIS — N184 Chronic kidney disease, stage 4 (severe): Secondary | ICD-10-CM | POA: Insufficient documentation

## 2019-04-26 DIAGNOSIS — E1122 Type 2 diabetes mellitus with diabetic chronic kidney disease: Secondary | ICD-10-CM | POA: Diagnosis not present

## 2019-04-26 LAB — RENAL FUNCTION PANEL
Albumin: 3.4 g/dL — ABNORMAL LOW (ref 3.5–5.0)
Anion gap: 10 (ref 5–15)
BUN: 37 mg/dL — ABNORMAL HIGH (ref 8–23)
CO2: 21 mmol/L — ABNORMAL LOW (ref 22–32)
Calcium: 9 mg/dL (ref 8.9–10.3)
Chloride: 109 mmol/L (ref 98–111)
Creatinine, Ser: 2.42 mg/dL — ABNORMAL HIGH (ref 0.44–1.00)
GFR calc Af Amer: 23 mL/min — ABNORMAL LOW (ref >60)
GFR calc non Af Amer: 19 mL/min — ABNORMAL LOW (ref >60)
Glucose, Bld: 123 mg/dL — ABNORMAL HIGH (ref 70–99)
Phosphorus: 4.4 mg/dL (ref 2.5–4.6)
Potassium: 4.7 mmol/L (ref 3.5–5.1)
Sodium: 140 mmol/L (ref 135–145)

## 2019-04-26 LAB — MAGNESIUM: Magnesium: 2.1 mg/dL (ref 1.7–2.4)

## 2019-04-26 LAB — PROTEIN / CREATININE RATIO, URINE
Creatinine, Urine: 60.37 mg/dL
Protein Creatinine Ratio: 0.8 mg/mg{Cre} — ABNORMAL HIGH (ref 0.00–0.15)
Total Protein, Urine: 48 mg/dL

## 2019-04-26 MED ORDER — EPOETIN ALFA 10000 UNIT/ML IJ SOLN
10000.0000 [IU] | INTRAMUSCULAR | Status: DC
  Administered 2019-04-26: 10000 [IU] via SUBCUTANEOUS

## 2019-04-26 MED ORDER — EPOETIN ALFA 10000 UNIT/ML IJ SOLN
INTRAMUSCULAR | Status: AC
  Filled 2019-04-26: qty 1

## 2019-04-27 LAB — HEMOGLOBIN A1C
Hgb A1c MFr Bld: 7.4 % — ABNORMAL HIGH (ref 4.8–5.6)
Mean Plasma Glucose: 166 mg/dL

## 2019-04-27 LAB — POCT HEMOGLOBIN-HEMACUE: Hemoglobin: 10.4 g/dL — ABNORMAL LOW (ref 12.0–15.0)

## 2019-04-27 LAB — PTH, INTACT AND CALCIUM
Calcium, Total (PTH): 9.2 mg/dL (ref 8.7–10.3)
PTH: 96 pg/mL — ABNORMAL HIGH (ref 15–65)

## 2019-04-29 DIAGNOSIS — Z794 Long term (current) use of insulin: Secondary | ICD-10-CM | POA: Diagnosis not present

## 2019-04-29 DIAGNOSIS — I10 Essential (primary) hypertension: Secondary | ICD-10-CM | POA: Diagnosis not present

## 2019-04-29 DIAGNOSIS — N184 Chronic kidney disease, stage 4 (severe): Secondary | ICD-10-CM | POA: Diagnosis not present

## 2019-04-29 DIAGNOSIS — E119 Type 2 diabetes mellitus without complications: Secondary | ICD-10-CM | POA: Diagnosis not present

## 2019-05-10 ENCOUNTER — Ambulatory Visit (HOSPITAL_COMMUNITY)
Admission: RE | Admit: 2019-05-10 | Discharge: 2019-05-10 | Disposition: A | Discharge status: Home / Self Care | Payer: Medicare Other | Source: Ambulatory Visit | Attending: Nephrology | Admitting: Nephrology

## 2019-05-10 ENCOUNTER — Other Ambulatory Visit: Payer: Self-pay

## 2019-05-10 VITALS — BP 153/66 | HR 85 | Temp 95.9°F | Resp 20

## 2019-05-10 DIAGNOSIS — N184 Chronic kidney disease, stage 4 (severe): Secondary | ICD-10-CM | POA: Diagnosis not present

## 2019-05-10 DIAGNOSIS — E1122 Type 2 diabetes mellitus with diabetic chronic kidney disease: Secondary | ICD-10-CM | POA: Diagnosis not present

## 2019-05-10 LAB — IRON AND TIBC
Iron: 93 ug/dL (ref 28–170)
Saturation Ratios: 38 % — ABNORMAL HIGH (ref 10.4–31.8)
TIBC: 244 ug/dL — ABNORMAL LOW (ref 250–450)
UIBC: 151 ug/dL

## 2019-05-10 LAB — POCT HEMOGLOBIN-HEMACUE: Hemoglobin: 10.3 g/dL — ABNORMAL LOW (ref 12.0–15.0)

## 2019-05-10 LAB — FERRITIN: Ferritin: 255 ng/mL (ref 11–307)

## 2019-05-10 MED ORDER — EPOETIN ALFA 10000 UNIT/ML IJ SOLN
10000.0000 [IU] | INTRAMUSCULAR | Status: DC
  Administered 2019-05-10: 09:00:00 10000 [IU] via SUBCUTANEOUS

## 2019-05-10 MED ORDER — EPOETIN ALFA 10000 UNIT/ML IJ SOLN
INTRAMUSCULAR | Status: AC
  Administered 2019-05-10: 10000 [IU] via SUBCUTANEOUS
  Filled 2019-05-10: qty 1

## 2019-05-16 DIAGNOSIS — D631 Anemia in chronic kidney disease: Secondary | ICD-10-CM | POA: Diagnosis not present

## 2019-05-16 DIAGNOSIS — N184 Chronic kidney disease, stage 4 (severe): Secondary | ICD-10-CM | POA: Diagnosis not present

## 2019-05-16 DIAGNOSIS — E1122 Type 2 diabetes mellitus with diabetic chronic kidney disease: Secondary | ICD-10-CM | POA: Diagnosis not present

## 2019-05-16 DIAGNOSIS — N2581 Secondary hyperparathyroidism of renal origin: Secondary | ICD-10-CM | POA: Diagnosis not present

## 2019-05-16 DIAGNOSIS — I129 Hypertensive chronic kidney disease with stage 1 through stage 4 chronic kidney disease, or unspecified chronic kidney disease: Secondary | ICD-10-CM | POA: Diagnosis not present

## 2019-05-24 ENCOUNTER — Other Ambulatory Visit: Payer: Self-pay

## 2019-05-24 ENCOUNTER — Ambulatory Visit (HOSPITAL_COMMUNITY)
Admission: RE | Admit: 2019-05-24 | Discharge: 2019-05-24 | Disposition: A | Discharge status: Home / Self Care | Payer: Medicare Other | Source: Ambulatory Visit | Attending: Nephrology | Admitting: Nephrology

## 2019-05-24 VITALS — BP 124/75 | HR 90 | Resp 20

## 2019-05-24 DIAGNOSIS — E1122 Type 2 diabetes mellitus with diabetic chronic kidney disease: Secondary | ICD-10-CM | POA: Diagnosis not present

## 2019-05-24 DIAGNOSIS — N184 Chronic kidney disease, stage 4 (severe): Secondary | ICD-10-CM | POA: Diagnosis not present

## 2019-05-24 LAB — POCT HEMOGLOBIN-HEMACUE: Hemoglobin: 10 g/dL — ABNORMAL LOW (ref 12.0–15.0)

## 2019-05-24 MED ORDER — EPOETIN ALFA 10000 UNIT/ML IJ SOLN
10000.0000 [IU] | INTRAMUSCULAR | Status: DC
  Administered 2019-05-24: 10000 [IU] via SUBCUTANEOUS

## 2019-05-24 MED ORDER — EPOETIN ALFA 10000 UNIT/ML IJ SOLN
INTRAMUSCULAR | Status: AC
  Filled 2019-05-24: qty 1

## 2019-06-06 ENCOUNTER — Encounter: Payer: Self-pay | Admitting: Internal Medicine

## 2019-06-06 ENCOUNTER — Other Ambulatory Visit: Payer: Self-pay

## 2019-06-06 ENCOUNTER — Ambulatory Visit (INDEPENDENT_AMBULATORY_CARE_PROVIDER_SITE_OTHER): Payer: Medicare Other | Admitting: Internal Medicine

## 2019-06-06 VITALS — BP 134/76 | HR 79 | Temp 98.6°F | Ht 64.2 in | Wt 183.4 lb

## 2019-06-06 DIAGNOSIS — K862 Cyst of pancreas: Secondary | ICD-10-CM

## 2019-06-06 DIAGNOSIS — E7800 Pure hypercholesterolemia, unspecified: Secondary | ICD-10-CM | POA: Diagnosis not present

## 2019-06-06 DIAGNOSIS — I129 Hypertensive chronic kidney disease with stage 1 through stage 4 chronic kidney disease, or unspecified chronic kidney disease: Secondary | ICD-10-CM | POA: Diagnosis not present

## 2019-06-06 DIAGNOSIS — N184 Chronic kidney disease, stage 4 (severe): Secondary | ICD-10-CM | POA: Diagnosis not present

## 2019-06-06 DIAGNOSIS — E1122 Type 2 diabetes mellitus with diabetic chronic kidney disease: Secondary | ICD-10-CM | POA: Diagnosis not present

## 2019-06-06 DIAGNOSIS — I739 Peripheral vascular disease, unspecified: Secondary | ICD-10-CM

## 2019-06-06 DIAGNOSIS — Z23 Encounter for immunization: Secondary | ICD-10-CM

## 2019-06-06 DIAGNOSIS — Z6831 Body mass index (BMI) 31.0-31.9, adult: Secondary | ICD-10-CM

## 2019-06-06 DIAGNOSIS — E6609 Other obesity due to excess calories: Secondary | ICD-10-CM | POA: Insufficient documentation

## 2019-06-06 HISTORY — DX: Peripheral vascular disease, unspecified: I73.9

## 2019-06-06 HISTORY — DX: Cyst of pancreas: K86.2

## 2019-06-06 MED ORDER — PNEUMOCOCCAL 13-VAL CONJ VACC IM SUSP: 0.5000 mL | INTRAMUSCULAR | 0 refills | Status: AC

## 2019-06-06 NOTE — Patient Instructions (Signed)

## 2019-06-07 ENCOUNTER — Encounter (HOSPITAL_COMMUNITY)
Admission: RE | Admit: 2019-06-07 | Discharge: 2019-06-07 | Disposition: A | Discharge status: Home / Self Care | Payer: Medicare Other | Source: Ambulatory Visit | Attending: Nephrology | Admitting: Nephrology

## 2019-06-07 VITALS — BP 152/77 | HR 78 | Temp 98.6°F | Resp 16 | Ht 64.0 in | Wt 183.0 lb

## 2019-06-07 DIAGNOSIS — E1122 Type 2 diabetes mellitus with diabetic chronic kidney disease: Secondary | ICD-10-CM | POA: Insufficient documentation

## 2019-06-07 DIAGNOSIS — N184 Chronic kidney disease, stage 4 (severe): Secondary | ICD-10-CM | POA: Diagnosis not present

## 2019-06-07 LAB — POCT HEMOGLOBIN-HEMACUE: Hemoglobin: 11.4 g/dL — ABNORMAL LOW (ref 12.0–15.0)

## 2019-06-07 MED ORDER — EPOETIN ALFA 10000 UNIT/ML IJ SOLN
10000.0000 [IU] | INTRAMUSCULAR | Status: DC
  Administered 2019-06-07: 10000 [IU] via SUBCUTANEOUS

## 2019-06-07 MED ORDER — EPOETIN ALFA 10000 UNIT/ML IJ SOLN
INTRAMUSCULAR | Status: AC
  Filled 2019-06-07: qty 1

## 2019-06-08 DIAGNOSIS — N184 Chronic kidney disease, stage 4 (severe): Secondary | ICD-10-CM | POA: Diagnosis not present

## 2019-06-08 NOTE — Progress Notes (Signed)
This visit occurred during the SARS-CoV-2 public health emergency.  Safety protocols were in place, including screening questions prior to the visit, additional usage of staff PPE, and extensive cleaning of exam room while observing appropriate contact time as indicated for disinfecting solutions.  Subjective:     Patient ID: Miranda Roman , female    DOB: [birth year] , 73 y.o.   MRN: [id number]   Chief Complaint  Patient presents with  . Diabetes  . Hyperlipidemia    HPI  Diabetes She presents for her follow-up diabetic visit. She has type 2 diabetes mellitus. Her disease course has been stable. There are no hypoglycemic associated symptoms. There are no diabetic associated symptoms. Pertinent negatives for diabetes include no blurred vision and no chest pain. There are no hypoglycemic complications. Diabetic complications include nephropathy. Risk factors for coronary artery disease include diabetes mellitus, dyslipidemia, hypertension, sedentary lifestyle and post-menopausal. Current diabetic treatment includes insulin injections. She is compliant with treatment most of the time. She is following a diabetic diet. Meal planning includes avoidance of concentrated sweets.  Hyperlipidemia This is a chronic problem. The current episode started more than 1 year ago. Exacerbating diseases include chronic renal disease, diabetes and obesity. Pertinent negatives include no chest pain or shortness of breath. Current antihyperlipidemic treatment includes statins. Compliance problems include adherence to exercise.  Risk factors for coronary artery disease include diabetes mellitus, dyslipidemia, hypertension, post-menopausal and a sedentary lifestyle.  Hypertension This is a chronic problem. The current episode started more than 1 year ago. The problem has been gradually improving since onset. Pertinent negatives include no blurred vision, chest pain, palpitations or shortness of breath. The current  treatment provides moderate improvement. Identifiable causes of hypertension include chronic renal disease.     Past Medical History:  Diagnosis Date  . Anemia   . Diabetes mellitus   . Hyperlipidemia   . Hypertension   . Osteoporosis   . Sickle cell anemia (HCC)      Family History  Problem Relation Age of Onset  . Diabetes Mother   . Heart disease Mother   . Diabetes Father   . Heart disease Father   . Diabetes Sister   . Diabetes Maternal Aunt   . Diabetes Maternal Uncle   . Diabetes Maternal Grandmother   . Diabetes Maternal Grandfather      Current Outpatient Medications:  .  acetaminophen (TYLENOL) 500 MG tablet, Take 500 mg by mouth every 6 (six) hours as needed for moderate pain., Disp: , Rfl:  .  aspirin 81 MG tablet, Take 81 mg by mouth daily., Disp: , Rfl:  .  atorvastatin (LIPITOR) 40 MG tablet, Take 40 mg by mouth daily., Disp: , Rfl:  .  calcitRIOL (ROCALTROL) 0.25 MCG capsule, Take 0.5-1 mcg by mouth daily. 3 tablets daily, Disp: , Rfl:  .  dexlansoprazole (DEXILANT) 60 MG capsule, Take 1 capsule (60 mg total) by mouth daily. (Patient taking differently: Take 60 mg by mouth daily as needed. Heart burn), Disp: 30 capsule, Rfl: 0 .  epoetin alfa (EPOGEN,PROCRIT) 82993 UNIT/ML injection, 10 Units once. Takes every 2 weeks, Disp: , Rfl:  .  glucose blood (ONETOUCH VERIO) test strip, Use as instructed to check blood sugars 1 time per day e11.22, Disp: 100 each, Rfl: 11 .  Insulin Detemir (LEVEMIR FLEXTOUCH) 100 UNIT/ML Pen, Inject 16 Units into the skin daily., Disp: 15 pen, Rfl: 1 .  insulin lispro (HUMALOG) 100 UNIT/ML injection, Inject 0.04 mLs (4 Units total) into the  skin 3 (three) times daily before meals. Sliding scale (Patient taking differently: Inject 4 Units into the skin 3 (three) times daily before meals. ), Disp: 10 mL, Rfl: 2 .  ONETOUCH DELICA LANCETS 22Q MISC, 1 each by Does not apply route daily. DX: E11.65, Disp: 150 each, Rfl: 11 .  loratadine  (CLARITIN) 10 MG tablet, Take 1 tablet (10 mg total) by mouth daily. (Patient not taking: Reported on 06/06/2019), Disp: 30 tablet, Rfl: 2   No Known Allergies   Review of Systems  Constitutional: Negative.   Eyes: Negative for blurred vision.  Respiratory: Negative.  Negative for shortness of breath.   Cardiovascular: Negative.  Negative for chest pain and palpitations.  Gastrointestinal: Negative.   Neurological: Negative.   Psychiatric/Behavioral: Negative.      Today's Vitals   06/06/19 1132  BP: 134/76  Pulse: 79  Temp: 98.6 F (37 C)  TempSrc: Oral  Weight: 183 lb 6.4 oz (83.2 kg)  Height: 5' 4.2" (1.631 m)   Body mass index is 31.28 kg/m.   Objective:  Physical Exam Vitals signs and nursing note reviewed.  Constitutional:      Appearance: Normal appearance. She is obese.  HENT:     Head: Normocephalic and atraumatic.  Cardiovascular:     Rate and Rhythm: Normal rate and regular rhythm.     Heart sounds: Normal heart sounds.  Pulmonary:     Effort: Pulmonary effort is normal.     Breath sounds: Normal breath sounds.  Skin:    General: Skin is warm.  Neurological:     General: No focal deficit present.     Mental Status: She is alert.  Psychiatric:        Mood and Affect: Mood normal.        Behavior: Behavior normal.         Assessment And Plan:     1. Diabetes mellitus with stage 4 chronic kidney disease ([place])  Recent labs reviewed in full detail, no need to repeat hba1c today. She is encouraged to incorporate more exercise into her daily routine. She is advised to aim for at least 150 minutes per week.  She will f/u in four months for re-evaluation.   2. Pure hypercholesterolemia  Last lipid drawn Sept 2020. LDL was at goal, 65. She will continue with current meds.   3. Hypertensive nephropathy  Chronic, controlled. She will continue with current meds. She is encouraged to avoid adding salt to her foods.   4. PAD (peripheral artery disease)  (HCC)  Chronic, yet stable. Importance of statin compliance was discussed with the patient.   5. Pancreatic cyst  Chronic. Most recent CT abdomen/MR abdomen were reviewed in full detail. She will have repeat MRI abdomen Sept 2022.   6. Need for vaccination  Rx [id number] was sent to her local pharmacy.  7. Class 1 obesity due to excess calories with serious comorbidity and body mass index (BMI) of 31.0 to 31.9 in adult  She is encouraged to lose ten pounds to decrease cardiac risk. Her goal is to achieve a BMI 27 or less.    [name], MD    THE PATIENT IS ENCOURAGED TO PRACTICE SOCIAL DISTANCING DUE TO THE COVID-19 PANDEMIC.

## 2019-06-10 DIAGNOSIS — I10 Essential (primary) hypertension: Secondary | ICD-10-CM | POA: Diagnosis not present

## 2019-06-10 DIAGNOSIS — E119 Type 2 diabetes mellitus without complications: Secondary | ICD-10-CM | POA: Diagnosis not present

## 2019-06-14 ENCOUNTER — Ambulatory Visit: Payer: Medicare Other | Admitting: Podiatry

## 2019-06-14 ENCOUNTER — Encounter: Payer: Self-pay | Admitting: Podiatry

## 2019-06-14 ENCOUNTER — Other Ambulatory Visit: Payer: Self-pay

## 2019-06-14 DIAGNOSIS — E782 Mixed hyperlipidemia: Secondary | ICD-10-CM | POA: Diagnosis not present

## 2019-06-14 DIAGNOSIS — D2372 Other benign neoplasm of skin of left lower limb, including hip: Secondary | ICD-10-CM

## 2019-06-14 DIAGNOSIS — M79676 Pain in unspecified toe(s): Secondary | ICD-10-CM | POA: Diagnosis not present

## 2019-06-14 DIAGNOSIS — M79674 Pain in right toe(s): Secondary | ICD-10-CM

## 2019-06-14 DIAGNOSIS — M79675 Pain in left toe(s): Secondary | ICD-10-CM

## 2019-06-14 DIAGNOSIS — E119 Type 2 diabetes mellitus without complications: Secondary | ICD-10-CM | POA: Diagnosis not present

## 2019-06-14 DIAGNOSIS — N184 Chronic kidney disease, stage 4 (severe): Secondary | ICD-10-CM | POA: Diagnosis not present

## 2019-06-14 DIAGNOSIS — I10 Essential (primary) hypertension: Secondary | ICD-10-CM | POA: Diagnosis not present

## 2019-06-14 DIAGNOSIS — B351 Tinea unguium: Secondary | ICD-10-CM

## 2019-06-14 DIAGNOSIS — L84 Corns and callosities: Secondary | ICD-10-CM

## 2019-06-14 DIAGNOSIS — E1122 Type 2 diabetes mellitus with diabetic chronic kidney disease: Secondary | ICD-10-CM | POA: Diagnosis not present

## 2019-06-16 DIAGNOSIS — Z9884 Bariatric surgery status: Secondary | ICD-10-CM | POA: Diagnosis not present

## 2019-06-16 DIAGNOSIS — N189 Chronic kidney disease, unspecified: Secondary | ICD-10-CM | POA: Diagnosis not present

## 2019-06-16 DIAGNOSIS — E119 Type 2 diabetes mellitus without complications: Secondary | ICD-10-CM | POA: Diagnosis not present

## 2019-06-19 NOTE — Progress Notes (Signed)
Subjective: Miranda Roman is seen today for follow up painful, elongated, thickened toenails bilateral feet that she cannot cut. Pain interferes with daily activities. Aggravating factor includes wearing enclosed shoe gear and relieved with periodic debridement.  She has concern of lesion dorsum aspect of left foot. She suspects an insect bite about 6 months ago. At times, lesion itches and she scratches it.   She also has a dark streak on the medial border of left great toe. It is not painful, no itching or burning, no drainage.   Medications reviewed in chart.  No Known Allergies   Objective:  Vascular Examination: Capillary refill time to digits immediate b/l.  Dorsalis pedis present b/l.  Posterior tibial pulses present b/l.  Digital hair absent b/l.  Skin temperature gradient WNL b/l.   Dermatological Examination: Skin with normal turgor, texture and tone b/l.  Hyperkeratotic lesion distal tip left 3rd digit. No erythema, no edema, no drainage, no flocculence noted. Porokeratotic lesions submet head 1 right, submet head 3 b/l, submet head 4 b/l, submet head 5 left foot and lateral 5th metatarsal head right foot with tenderness to palpation. No erythema, no edema, no drainage, no flocculence.  Raised flesh colored annular lesion noted dorsum of left foot. No asymmetry. No irregular borders, no discoloration. Minimal elevation. No bleeding.  Longitudinal hyperpigmentation medial border left hallux consistent with melanin pigmentation. No extension to skin.   Toenails 1-5 b/l discolored, thick, dystrophic with subungual debris and pain with palpation to nailbeds due to thickness of nails.  Musculoskeletal: Muscle strength 5/5 to all LE muscle groups b/l.  Neurological Examination: Protective sensation intact 5/5 with 10 gram monofilament bilaterally.  Assessment: 1. Painful onychomycosis toenails 1-5 b/l  2. Corn left 3rd digit 3. Porokeratoses submet head 1 right,  submet head 3 b/l, submet head 4 b/l, submet head 5 left foot and lateral 5th metatarsal head right foot 4.  Dermatofibroma left foot 5.  NIDDM with CKD stage 4  Plan: 1. Toenails 1-5 b/l were debrided in length and girth without iatrogenic bleeding. 2. Porokeratoses submet head 1 right, submet head 3 b/l, submet head 4 b/l, submet head 5 left foot and lateral 5th metatarsal head right foot pared and enucleated with sterile scalpel blade without incident. Corn left 3rd digit pared utilizing sterile scalpel blade without incident. 3. Discussed dermatofibroma. Advised against scratching as it irritates lesion. Recommended using Aquaphor Ointment or Vaseline petroleum jelly to lesion. Discussed subungual melanin pigmentation. Does not appear suspicious presently. Will monitor both lesions.  4. Patient to continue soft, supportive shoe gear daily. 5. Patient to report any pedal injuries to medical professional immediately. 6. Follow up 3 months.  7. Patient/POA to call should there be a concern in the interim.

## 2019-06-21 ENCOUNTER — Inpatient Hospital Stay (HOSPITAL_COMMUNITY): Admission: RE | Admit: 2019-06-21 | Payer: Medicare Other | Source: Ambulatory Visit

## 2019-06-21 DIAGNOSIS — E119 Type 2 diabetes mellitus without complications: Secondary | ICD-10-CM | POA: Diagnosis not present

## 2019-06-23 ENCOUNTER — Other Ambulatory Visit: Payer: Self-pay | Admitting: Internal Medicine

## 2019-06-28 DIAGNOSIS — I10 Essential (primary) hypertension: Secondary | ICD-10-CM | POA: Diagnosis not present

## 2019-07-05 ENCOUNTER — Other Ambulatory Visit: Payer: Self-pay

## 2019-07-05 ENCOUNTER — Ambulatory Visit (HOSPITAL_COMMUNITY)
Admission: RE | Admit: 2019-07-05 | Discharge: 2019-07-05 | Disposition: A | Discharge status: Home / Self Care | Payer: Medicare Other | Source: Ambulatory Visit | Attending: Nephrology | Admitting: Nephrology

## 2019-07-05 VITALS — BP 143/76 | HR 81 | Temp 96.6°F | Resp 20

## 2019-07-05 DIAGNOSIS — E1122 Type 2 diabetes mellitus with diabetic chronic kidney disease: Secondary | ICD-10-CM | POA: Diagnosis not present

## 2019-07-05 DIAGNOSIS — N184 Chronic kidney disease, stage 4 (severe): Secondary | ICD-10-CM | POA: Diagnosis not present

## 2019-07-05 DIAGNOSIS — E119 Type 2 diabetes mellitus without complications: Secondary | ICD-10-CM | POA: Diagnosis not present

## 2019-07-05 LAB — FERRITIN: Ferritin: 230 ng/mL (ref 11–307)

## 2019-07-05 LAB — IRON AND TIBC
Iron: 67 ug/dL (ref 28–170)
Saturation Ratios: 28 % (ref 10.4–31.8)
TIBC: 242 ug/dL — ABNORMAL LOW (ref 250–450)
UIBC: 175 ug/dL

## 2019-07-05 MED ORDER — EPOETIN ALFA 10000 UNIT/ML IJ SOLN
INTRAMUSCULAR | Status: AC
  Filled 2019-07-05: qty 1

## 2019-07-05 MED ORDER — EPOETIN ALFA 10000 UNIT/ML IJ SOLN
10000.0000 [IU] | INTRAMUSCULAR | Status: DC
  Administered 2019-07-05: 10000 [IU] via SUBCUTANEOUS

## 2019-07-06 LAB — POCT HEMOGLOBIN-HEMACUE: Hemoglobin: 10.3 g/dL — ABNORMAL LOW (ref 12.0–15.0)

## 2019-07-12 DIAGNOSIS — E782 Mixed hyperlipidemia: Secondary | ICD-10-CM | POA: Diagnosis not present

## 2019-07-12 DIAGNOSIS — I10 Essential (primary) hypertension: Secondary | ICD-10-CM | POA: Diagnosis not present

## 2019-07-19 ENCOUNTER — Encounter (HOSPITAL_COMMUNITY): Payer: Medicare Other

## 2019-08-02 ENCOUNTER — Ambulatory Visit (HOSPITAL_COMMUNITY)
Admission: RE | Admit: 2019-08-02 | Discharge: 2019-08-02 | Disposition: A | Discharge status: Home / Self Care | Payer: Medicare Other | Source: Ambulatory Visit | Attending: Nephrology | Admitting: Nephrology

## 2019-08-02 ENCOUNTER — Other Ambulatory Visit: Payer: Self-pay

## 2019-08-02 VITALS — BP 149/69 | HR 86

## 2019-08-02 DIAGNOSIS — N184 Chronic kidney disease, stage 4 (severe): Secondary | ICD-10-CM | POA: Insufficient documentation

## 2019-08-02 DIAGNOSIS — I10 Essential (primary) hypertension: Secondary | ICD-10-CM | POA: Diagnosis not present

## 2019-08-02 DIAGNOSIS — E1122 Type 2 diabetes mellitus with diabetic chronic kidney disease: Secondary | ICD-10-CM | POA: Insufficient documentation

## 2019-08-02 DIAGNOSIS — Z7689 Persons encountering health services in other specified circumstances: Secondary | ICD-10-CM | POA: Diagnosis not present

## 2019-08-02 LAB — POCT HEMOGLOBIN-HEMACUE: Hemoglobin: 10.5 g/dL — ABNORMAL LOW (ref 12.0–15.0)

## 2019-08-02 LAB — IRON AND TIBC
Iron: 72 ug/dL (ref 28–170)
Saturation Ratios: 28 % (ref 10.4–31.8)
TIBC: 258 ug/dL (ref 250–450)
UIBC: 186 ug/dL

## 2019-08-02 LAB — FERRITIN: Ferritin: 153 ng/mL (ref 11–307)

## 2019-08-02 MED ORDER — EPOETIN ALFA 10000 UNIT/ML IJ SOLN: 10000.0000 [IU] | INTRAMUSCULAR | Status: DC

## 2019-08-02 MED ORDER — EPOETIN ALFA 10000 UNIT/ML IJ SOLN
INTRAMUSCULAR | Status: AC
  Administered 2019-08-02: 10000 [IU] via SUBCUTANEOUS
  Filled 2019-08-02: qty 1

## 2019-08-08 DIAGNOSIS — E1122 Type 2 diabetes mellitus with diabetic chronic kidney disease: Secondary | ICD-10-CM | POA: Diagnosis not present

## 2019-08-08 DIAGNOSIS — D631 Anemia in chronic kidney disease: Secondary | ICD-10-CM | POA: Diagnosis not present

## 2019-08-08 DIAGNOSIS — N184 Chronic kidney disease, stage 4 (severe): Secondary | ICD-10-CM | POA: Diagnosis not present

## 2019-08-08 DIAGNOSIS — I129 Hypertensive chronic kidney disease with stage 1 through stage 4 chronic kidney disease, or unspecified chronic kidney disease: Secondary | ICD-10-CM | POA: Diagnosis not present

## 2019-08-08 DIAGNOSIS — N2581 Secondary hyperparathyroidism of renal origin: Secondary | ICD-10-CM | POA: Diagnosis not present

## 2019-08-10 ENCOUNTER — Other Ambulatory Visit: Payer: Self-pay | Admitting: Nephrology

## 2019-08-10 DIAGNOSIS — N184 Chronic kidney disease, stage 4 (severe): Secondary | ICD-10-CM

## 2019-08-12 ENCOUNTER — Other Ambulatory Visit: Payer: Self-pay | Admitting: Internal Medicine

## 2019-08-18 DIAGNOSIS — Z794 Long term (current) use of insulin: Secondary | ICD-10-CM | POA: Diagnosis not present

## 2019-08-18 DIAGNOSIS — E1121 Type 2 diabetes mellitus with diabetic nephropathy: Secondary | ICD-10-CM | POA: Diagnosis not present

## 2019-08-18 DIAGNOSIS — I151 Hypertension secondary to other renal disorders: Secondary | ICD-10-CM | POA: Diagnosis not present

## 2019-08-18 DIAGNOSIS — E782 Mixed hyperlipidemia: Secondary | ICD-10-CM | POA: Diagnosis not present

## 2019-08-30 ENCOUNTER — Ambulatory Visit (HOSPITAL_COMMUNITY)
Admission: RE | Admit: 2019-08-30 | Discharge: 2019-08-30 | Disposition: A | Discharge status: Home / Self Care | Payer: Medicare Other | Source: Ambulatory Visit | Attending: Nephrology | Admitting: Nephrology

## 2019-08-30 ENCOUNTER — Other Ambulatory Visit: Payer: Self-pay

## 2019-08-30 VITALS — BP 151/74 | HR 81 | Temp 95.8°F | Resp 20

## 2019-08-30 DIAGNOSIS — E1122 Type 2 diabetes mellitus with diabetic chronic kidney disease: Secondary | ICD-10-CM | POA: Diagnosis not present

## 2019-08-30 DIAGNOSIS — N184 Chronic kidney disease, stage 4 (severe): Secondary | ICD-10-CM | POA: Diagnosis not present

## 2019-08-30 DIAGNOSIS — E1121 Type 2 diabetes mellitus with diabetic nephropathy: Secondary | ICD-10-CM | POA: Diagnosis not present

## 2019-08-30 LAB — FERRITIN: Ferritin: 178 ng/mL (ref 11–307)

## 2019-08-30 LAB — IRON AND TIBC
Iron: 75 ug/dL (ref 28–170)
Saturation Ratios: 30 % (ref 10.4–31.8)
TIBC: 248 ug/dL — ABNORMAL LOW (ref 250–450)
UIBC: 173 ug/dL

## 2019-08-30 LAB — POCT HEMOGLOBIN-HEMACUE: Hemoglobin: 9.9 g/dL — ABNORMAL LOW (ref 12.0–15.0)

## 2019-08-30 MED ORDER — EPOETIN ALFA 10000 UNIT/ML IJ SOLN
10000.0000 [IU] | INTRAMUSCULAR | Status: DC
  Administered 2019-08-30: 10000 [IU] via SUBCUTANEOUS

## 2019-08-30 MED ORDER — EPOETIN ALFA 10000 UNIT/ML IJ SOLN
INTRAMUSCULAR | Status: AC
  Filled 2019-08-30: qty 1

## 2019-09-07 ENCOUNTER — Ambulatory Visit
Admission: RE | Admit: 2019-09-07 | Discharge: 2019-09-07 | Disposition: A | Discharge status: Home / Self Care | Payer: Medicare Other | Source: Ambulatory Visit | Attending: Nephrology | Admitting: Nephrology

## 2019-09-07 DIAGNOSIS — N184 Chronic kidney disease, stage 4 (severe): Secondary | ICD-10-CM

## 2019-09-12 ENCOUNTER — Ambulatory Visit: Payer: Medicare Other | Admitting: Podiatry

## 2019-09-12 ENCOUNTER — Other Ambulatory Visit: Payer: Self-pay

## 2019-09-12 ENCOUNTER — Encounter: Payer: Self-pay | Admitting: Podiatry

## 2019-09-12 DIAGNOSIS — B351 Tinea unguium: Secondary | ICD-10-CM | POA: Diagnosis not present

## 2019-09-12 DIAGNOSIS — N184 Chronic kidney disease, stage 4 (severe): Secondary | ICD-10-CM | POA: Diagnosis not present

## 2019-09-12 DIAGNOSIS — M79675 Pain in left toe(s): Secondary | ICD-10-CM | POA: Diagnosis not present

## 2019-09-12 DIAGNOSIS — Q828 Other specified congenital malformations of skin: Secondary | ICD-10-CM

## 2019-09-12 DIAGNOSIS — M79674 Pain in right toe(s): Secondary | ICD-10-CM

## 2019-09-12 DIAGNOSIS — E1122 Type 2 diabetes mellitus with diabetic chronic kidney disease: Secondary | ICD-10-CM

## 2019-09-12 DIAGNOSIS — L84 Corns and callosities: Secondary | ICD-10-CM | POA: Diagnosis not present

## 2019-09-12 NOTE — Patient Instructions (Signed)
Diabetes Mellitus and Foot Care Foot care is an important part of your health, especially when you have diabetes. Diabetes may cause you to have problems because of poor blood flow (circulation) to your feet and legs, which can cause your skin to:  Become thinner and drier.  Break more easily.  Heal more slowly.  Peel and crack. You may also have nerve damage (neuropathy) in your legs and feet, causing decreased feeling in them. This means that you may not notice minor injuries to your feet that could lead to more serious problems. Noticing and addressing any potential problems early is the best way to prevent future foot problems. How to care for your feet Foot hygiene  Wash your feet daily with warm water and mild soap. Do not use hot water. Then, pat your feet and the areas between your toes until they are completely dry. Do not soak your feet as this can dry your skin.  Trim your toenails straight across. Do not dig under them or around the cuticle. File the edges of your nails with an emery board or nail file.  Apply a moisturizing lotion or petroleum jelly to the skin on your feet and to dry, brittle toenails. Use lotion that does not contain alcohol and is unscented. Do not apply lotion between your toes. Shoes and socks  Wear clean socks or stockings every day. Make sure they are not too tight. Do not wear knee-high stockings since they may decrease blood flow to your legs.  Wear shoes that fit properly and have enough cushioning. Always look in your shoes before you put them on to be sure there are no objects inside.  To break in new shoes, wear them for just a few hours a day. This prevents injuries on your feet. Wounds, scrapes, corns, and calluses  Check your feet daily for blisters, cuts, bruises, sores, and redness. If you cannot see the bottom of your feet, use a mirror or ask someone for help.  Do not cut corns or calluses or try to remove them with medicine.  If you  find a minor scrape, cut, or break in the skin on your feet, keep it and the skin around it clean and dry. You may clean these areas with mild soap and water. Do not clean the area with peroxide, alcohol, or iodine.  If you have a wound, scrape, corn, or callus on your foot, look at it several times a day to make sure it is healing and not infected. Check for: ? Redness, swelling, or pain. ? Fluid or blood. ? Warmth. ? Pus or a bad smell. General instructions  Do not cross your legs. This may decrease blood flow to your feet.  Do not use heating pads or hot water bottles on your feet. They may burn your skin. If you have lost feeling in your feet or legs, you may not know this is happening until it is too late.  Protect your feet from hot and cold by wearing shoes, such as at the beach or on hot pavement.  Schedule a complete foot exam at least once a year (annually) or more often if you have foot problems. If you have foot problems, report any cuts, sores, or bruises to your health care provider immediately. Contact a health care provider if:  You have a medical condition that increases your risk of infection and you have any cuts, sores, or bruises on your feet.  You have an injury that is not   healing.  You have redness on your legs or feet.  You feel burning or tingling in your legs or feet.  You have pain or cramps in your legs and feet.  Your legs or feet are numb.  Your feet always feel cold.  You have pain around a toenail. Get help right away if:  You have a wound, scrape, corn, or callus on your foot and: ? You have pain, swelling, or redness that gets worse. ? You have fluid or blood coming from the wound, scrape, corn, or callus. ? Your wound, scrape, corn, or callus feels warm to the touch. ? You have pus or a bad smell coming from the wound, scrape, corn, or callus. ? You have a fever. ? You have a red line going up your leg. Summary  Check your feet every day  for cuts, sores, red spots, swelling, and blisters.  Moisturize feet and legs daily.  Wear shoes that fit properly and have enough cushioning.  If you have foot problems, report any cuts, sores, or bruises to your health care provider immediately.  Schedule a complete foot exam at least once a year (annually) or more often if you have foot problems. This information is not intended to replace advice given to you by your health care provider. Make sure you discuss any questions you have with your health care provider. Document Revised: 03/16/2019 Document Reviewed: 07/25/2016 Elsevier Patient Education  2020 [employer]  

## 2019-09-13 DIAGNOSIS — E782 Mixed hyperlipidemia: Secondary | ICD-10-CM | POA: Diagnosis not present

## 2019-09-13 DIAGNOSIS — I151 Hypertension secondary to other renal disorders: Secondary | ICD-10-CM | POA: Diagnosis not present

## 2019-09-19 NOTE — Progress Notes (Signed)
Subjective: Miranda Roman presents today for follow up of preventative diabetic foot care, painful porokeratotic lesion(s) plantar aspect of both feet and painful mycotic toenails b/l that limit ambulation. Aggravating factors include weightbearing with and without shoe gear. Pain for both is relieved with periodic professional debridement. and painful corn(s) left 3rd digit  which interfere(s) with ambulation. Aggravating factors include wearing enclosed shoe gear. Pain is relieved with periodic professional debridement.   Pt states she has had problems itching of skin all over and will speak to her PCP. Denies any introduction of new medications. Denies any rash, shortness of breath or swelling. She was told by her sister this is a sign of neuropathy.  No Known Allergies   Objective: There were no vitals filed for this visit.  Pt 74 y.o. year old female  in NAD. AAO x 3.   Vascular Examination:  Capillary refill time to digits immediate b/l. Palpable DP pulses b/l. Palpable PT pulses b/l. Pedal hair absent b/l Skin temperature gradient within normal limits b/l.  Dermatological Examination: Pedal skin with normal turgor, texture and tone bilaterally. No open wounds bilaterally. No interdigital macerations bilaterally. Toenails 1-5 b/l elongated, dystrophic, thickened, crumbly with subungual debris and tenderness to dorsal palpation. Raised flesh colored annular lesion noted dorsum of left foot, unchanged. No asymmetry, no irregualr borders, no discoloration, minimal elevation and no bleeding. Hyperkeratotic lesion(s) distal tip left 3rd digit.  No erythema, no edema, no drainage, no flocculence. Porokeratotic lesion(s) submet head 1 right foot, submet head 3 b/l, submet head 4 b/l, submet head 5 left foot and lateral 5th metatarsal head right foot all with tenderness to palpation. No erythema, no edema, no drainage, no flocculence.  Musculoskeletal: Normal muscle strength 5/5 to all lower  extremity muscle groups bilaterally, no pain crepitus or joint limitation noted with ROM b/l and tailor's bunion deformity noted b/l  Neurological: Protective sensation intact 5/5 intact bilaterally with 10g monofilament b/l  Assessment: 1. Pain due to onychomycosis of toenails of both feet   2. Corns   3. Porokeratosis   4. Diabetes mellitus with stage 4 chronic kidney disease ([place])    Plan: -Continue diabetic foot care principles. Literature dispensed on today.  -She will contact her PCP for c/o itching.  -Toenails 1-5 b/l were debrided in length and girth with sterile nail nippers and dremel without iatrogenic bleeding.  -Corn(s) debrided left 3rd digit without complication or incident. Total number debrided=1. -Painful porokeratotic lesions submet head 1 right foot, submet head 3 b/l, submet head 4 b/l, submet head 5 left foot and lateral 5th metatarsal head right foot pared and enucleated with sterile scalpel blade without incident. -Patient to continue soft, supportive shoe gear daily. -Patient to report any pedal injuries to medical professional immediately. -Patient/POA to call should there be question/concern in the interim.  Return in about 9 weeks (around 11/14/2019) for diabetic nail and callus trim.

## 2019-09-23 ENCOUNTER — Ambulatory Visit (INDEPENDENT_AMBULATORY_CARE_PROVIDER_SITE_OTHER)
Admission: RE | Admit: 2019-09-23 | Discharge: 2019-09-23 | Disposition: A | Discharge status: Home / Self Care | Payer: Medicare Other | Source: Ambulatory Visit | Attending: Emergency Medicine | Admitting: Emergency Medicine

## 2019-09-23 ENCOUNTER — Other Ambulatory Visit: Payer: Self-pay

## 2019-09-23 DIAGNOSIS — J479 Bronchiectasis, uncomplicated: Secondary | ICD-10-CM | POA: Diagnosis not present

## 2019-09-23 DIAGNOSIS — R911 Solitary pulmonary nodule: Secondary | ICD-10-CM | POA: Diagnosis not present

## 2019-09-26 ENCOUNTER — Other Ambulatory Visit: Payer: Self-pay

## 2019-09-26 ENCOUNTER — Encounter: Payer: Self-pay | Admitting: Emergency Medicine

## 2019-09-26 ENCOUNTER — Ambulatory Visit: Payer: Medicare Other | Admitting: Emergency Medicine

## 2019-09-26 VITALS — BP 128/64 | HR 83 | Temp 97.4°F | Ht 64.5 in | Wt 171.2 lb

## 2019-09-26 DIAGNOSIS — R911 Solitary pulmonary nodule: Secondary | ICD-10-CM

## 2019-09-26 DIAGNOSIS — R9389 Abnormal findings on diagnostic imaging of other specified body structures: Secondary | ICD-10-CM | POA: Diagnosis not present

## 2019-09-26 NOTE — Addendum Note (Signed)
Addended by: Tery Sanfilippo R on: 09/26/2019 10:43 AM   Modules accepted: Orders

## 2019-09-26 NOTE — Assessment & Plan Note (Signed)
With some mild bronchiectatic change, scar and pulmonary nodular disease.  No progression on her most recent CT.  She has a small tobacco history, probably a low to moderate risk patient for lung cancer.  That being said she had secondhand smoke exposure, is concerned about possible interval change in her pulmonary nodule.  I think it is reasonable to repeat her CT scan in 1 year for interval stability.  If the pulmonary nodules are unchanged and her bronchiectasis is stable and I think we can follow her clinically, plan any imaging or treatment based on symptoms.  She does not make sputum, was not able to give me AFB or fungal samples last year.  She is not coughing at all now.

## 2019-09-26 NOTE — Progress Notes (Signed)
Subjective:    Patient ID: Miranda Roman, female    DOB: 1946/01/06, 74 y.o.   MRN: [id number]  HPI 74 year old former smoker (10 pack years) with history of diabetes, hyperlipidemia, hypertension and sickle cell and b-thalasemia trait. .  She is referred today for evaluation of abnormal CT scan of the chest that identified bronchiectasis and small pulmonary nodule. The scan was done because she was dealing with persistent cough x 6 weeks, was non-productive. She rarely produces mucous now, but had frequent bronchitis when she was a smoker. She had PNA as a child. Denies any current sx. She does occasionally have UA cough that she relates to seasonal rhinitis.   CT scan chest done on 09/15/2018 was reviewed by me, shows scattered mild cylindrical bronchiectatic change especially in the right middle lobe with some associated bronchial distortion.  There is an adjacent 2 mm pulmonary nodule  ROV 09/26/19 --follow-up visit for 74 year old woman with diabetes, hyperlipidemia, hypertension, sickle cell and beta thalassemia trait.  She has bronchiectasis by CT scan of the chest with some associated pulmonary nodular disease.  IgE 31, CFTR genotype normal.  She underwent a repeat CT scan of the chest 09/23/2019 which I have reviewed, shows no significant changes in her bronchiectatic change.  Her right middle lobe pulmonary nodule remains stable, 7 mm associated with some scar/atelectasis.  Other scattered nodules are stable. She has been well since last visit, has had her COVID vaccines. No cough. No SOB MDM: - office visit Dr Baird Cancer 06/06/19 - CT chest 09/23/2019   Review of Systems  Constitutional: Negative.   HENT: Positive for congestion, postnasal drip and rhinorrhea. Negative for sinus pressure, sinus pain, sneezing and sore throat.   Respiratory: Negative.   Cardiovascular: Negative.   Gastrointestinal: Negative.   Musculoskeletal: Negative.   Neurological: Negative.     Past Medical  History:  Diagnosis Date  . Anemia   . Diabetes mellitus   . Hyperlipidemia   . Hypertension   . Osteoporosis   . Sickle cell anemia (HCC)      Family History  Problem Relation Age of Onset  . Diabetes Mother   . Heart disease Mother   . Diabetes Father   . Heart disease Father   . Diabetes Sister   . Diabetes Maternal Aunt   . Diabetes Maternal Uncle   . Diabetes Maternal Grandmother   . Diabetes Maternal Grandfather      Social History   Socioeconomic History  . Marital status: Widowed    Spouse name: Not on file  . Number of children: Not on file  . Years of education: Not on file  . Highest education level: Not on file  Occupational History  . Occupation: retired  Tobacco Use  . Smoking status: Former Smoker    Packs/day: 0.25    Years: 40.00    Pack years: 10.00    Types: Cigarettes    Quit date: 04/2005    Years since quitting: 14.4  . Smokeless tobacco: Never Used  Substance and Sexual Activity  . Alcohol use: Yes    Comment: occasionally  . Drug use: No  . Sexual activity: Yes  Other Topics Concern  . Not on file  Social History Narrative  . Not on file   Social Determinants of Health   Financial Resource Strain:   . Difficulty of Paying Living Expenses:   Food Insecurity:   . Worried About Charity fundraiser in the Last Year:   .  Ran Out of Food in the Last Year:   Transportation Needs:   . Film/video editor (Medical):   [name] Lack of Transportation (Non-Medical):   Physical Activity:   . Days of Exercise per Week:   . Minutes of Exercise per Session:   Stress:   . Feeling of Stress :   Social Connections:   . Frequency of Communication with Friends and Family:   . Frequency of Social Gatherings with Friends and Family:   . Attends Religious Services:   . Active Member of Clubs or Organizations:   . Attends Archivist Meetings:   [name] Marital Status:   Intimate Partner Violence:   . Fear of Current or Ex-Partner:   .  Emotionally Abused:   [name] Physically Abused:   . Sexually Abused:      No Known Allergies   Outpatient Medications Prior to Visit  Medication Sig Dispense Refill  . acetaminophen (TYLENOL) 500 MG tablet Take 500 mg by mouth every 6 (six) hours as needed for moderate pain.    [name] amLODipine (NORVASC) 2.5 MG tablet Take 2.5 mg by mouth daily.    [name] atorvastatin (LIPITOR) 40 MG tablet Take 40 mg by mouth daily.    . calcitRIOL (ROCALTROL) 0.25 MCG capsule Take 0.5-1 mcg by mouth daily. 3 tablets daily    . DEXILANT 60 MG capsule TAKE 1 CAPSULE BY MOUTH  DAILY 30 capsule 11  . epoetin alfa (EPOGEN,PROCRIT) 29518 UNIT/ML injection 10 Units once. Takes every 2 weeks    . glucose blood (ONETOUCH VERIO) test strip Use as instructed to check blood sugars 1 time per day e11.22 100 each 11  . insulin lispro (HUMALOG) 100 UNIT/ML injection Inject 0.04 mLs (4 Units total) into the skin 3 (three) times daily before meals. Sliding scale (Patient taking differently: Inject 4 Units into the skin 3 (three) times daily before meals. ) 10 mL 2  . LEVEMIR FLEXTOUCH 100 UNIT/ML Pen INJECT SUBCUTANEOUSLY 16  UNITS DAILY 15 mL 3  . loratadine (CLARITIN) 10 MG tablet Take 1 tablet (10 mg total) by mouth daily. 30 tablet 2  . ONETOUCH DELICA LANCETS 84Z MISC 1 each by Does not apply route daily. DX: E11.65 150 each 11  . aspirin 81 MG tablet Take 81 mg by mouth daily.    . ergocalciferol (VITAMIN D2) 1.25 MG (50000 UT) capsule ergocalciferol (vitamin D2) 1,250 mcg (50,000 unit) capsule  TAKE ONE CAPSULE BY MOUTH TUESDAYS AND FRIDAYS    . quinapril (ACCUPRIL) 10 MG tablet Take 10 mg by mouth daily.     No facility-administered medications prior to visit.        Objective:   Physical Exam Vitals:   09/26/19 1004  BP: 128/64  Pulse: 83  Temp: (!) 97.4 F (36.3 C)  TempSrc: Temporal  SpO2: 100%  Weight: 171 lb 3.2 oz (77.7 kg)  Height: 5' 4.5" (1.638 m)   Gen: Pleasant, well-nourished, in no distress,   normal affect  ENT: No lesions,  mouth clear,  oropharynx clear, no postnasal drip  Neck: No JVD, no stridor  Lungs: No use of accessory muscles, no crackles or wheezing on normal respiration, no wheeze on forced expiration  Cardiovascular: Regular, soft holosystolic M  Musculoskeletal: No deformities, no cyanosis or clubbing  Neuro: alert, awake, non focal  Skin: Warm, no lesions or rash      Assessment & Plan:  Abnormal CT of the chest With some mild bronchiectatic change, scar and pulmonary nodular  disease.  No progression on her most recent CT.  She has a small tobacco history, probably a low to moderate risk patient for lung cancer.  That being said she had secondhand smoke exposure, is concerned about possible interval change in her pulmonary nodule.  I think it is reasonable to repeat her CT scan in 1 year for interval stability.  If the pulmonary nodules are unchanged and her bronchiectasis is stable and I think we can follow her clinically, plan any imaging or treatment based on symptoms.  She does not make sputum, was not able to give me AFB or fungal samples last year.  She is not coughing at all now.  Baltazar Apo, MD, PhD 09/26/2019, 10:30 AM [facility] [phone] or if no answer [phone]

## 2019-09-26 NOTE — Patient Instructions (Addendum)
We will plan to repeat your CT chest in March 2022 to follow bronchiectasis and pulmonary nodule.  Please call our office if you have any changes in your breathing, increased cough, mucus production. Follow with Dr. Lamonte Sakai in March 2022 after your CT scan to review the results together.

## 2019-09-27 ENCOUNTER — Ambulatory Visit (HOSPITAL_COMMUNITY)
Admission: RE | Admit: 2019-09-27 | Discharge: 2019-09-27 | Disposition: A | Discharge status: Home / Self Care | Payer: Medicare Other | Source: Ambulatory Visit | Attending: Nephrology | Admitting: Nephrology

## 2019-09-27 ENCOUNTER — Ambulatory Visit: Payer: Medicare Other | Admitting: Emergency Medicine

## 2019-09-27 VITALS — BP 135/74 | HR 81 | Temp 95.2°F | Resp 20

## 2019-09-27 DIAGNOSIS — N184 Chronic kidney disease, stage 4 (severe): Secondary | ICD-10-CM | POA: Diagnosis not present

## 2019-09-27 DIAGNOSIS — E1122 Type 2 diabetes mellitus with diabetic chronic kidney disease: Secondary | ICD-10-CM | POA: Insufficient documentation

## 2019-09-27 LAB — IRON AND TIBC
Iron: 68 ug/dL (ref 28–170)
Saturation Ratios: 26 % (ref 10.4–31.8)
TIBC: 258 ug/dL (ref 250–450)
UIBC: 190 ug/dL

## 2019-09-27 LAB — FERRITIN: Ferritin: 168 ng/mL (ref 11–307)

## 2019-09-27 LAB — POCT HEMOGLOBIN-HEMACUE: Hemoglobin: 10.3 g/dL — ABNORMAL LOW (ref 12.0–15.0)

## 2019-09-27 MED ORDER — EPOETIN ALFA 10000 UNIT/ML IJ SOLN
10000.0000 [IU] | INTRAMUSCULAR | Status: DC
  Administered 2019-09-27: 10000 [IU] via SUBCUTANEOUS

## 2019-09-27 MED ORDER — EPOETIN ALFA 10000 UNIT/ML IJ SOLN
INTRAMUSCULAR | Status: AC
  Filled 2019-09-27: qty 1

## 2019-09-28 ENCOUNTER — Ambulatory Visit (INDEPENDENT_AMBULATORY_CARE_PROVIDER_SITE_OTHER): Payer: Medicare Other

## 2019-09-28 ENCOUNTER — Ambulatory Visit (INDEPENDENT_AMBULATORY_CARE_PROVIDER_SITE_OTHER): Payer: Medicare Other | Admitting: Internal Medicine

## 2019-09-28 ENCOUNTER — Other Ambulatory Visit: Payer: Self-pay

## 2019-09-28 ENCOUNTER — Encounter: Payer: Self-pay | Admitting: Internal Medicine

## 2019-09-28 VITALS — BP 122/64 | HR 88 | Temp 98.9°F | Ht 64.8 in | Wt 171.6 lb

## 2019-09-28 DIAGNOSIS — I129 Hypertensive chronic kidney disease with stage 1 through stage 4 chronic kidney disease, or unspecified chronic kidney disease: Secondary | ICD-10-CM

## 2019-09-28 DIAGNOSIS — N184 Chronic kidney disease, stage 4 (severe): Secondary | ICD-10-CM

## 2019-09-28 DIAGNOSIS — Z0000 Encounter for general adult medical examination without abnormal findings: Secondary | ICD-10-CM

## 2019-09-28 DIAGNOSIS — E1122 Type 2 diabetes mellitus with diabetic chronic kidney disease: Secondary | ICD-10-CM

## 2019-09-28 DIAGNOSIS — M5431 Sciatica, right side: Secondary | ICD-10-CM

## 2019-09-28 DIAGNOSIS — L989 Disorder of the skin and subcutaneous tissue, unspecified: Secondary | ICD-10-CM

## 2019-09-28 DIAGNOSIS — E663 Overweight: Secondary | ICD-10-CM

## 2019-09-28 DIAGNOSIS — Z6828 Body mass index (BMI) 28.0-28.9, adult: Secondary | ICD-10-CM

## 2019-09-28 DIAGNOSIS — Z712 Person consulting for explanation of examination or test findings: Secondary | ICD-10-CM | POA: Diagnosis not present

## 2019-09-28 DIAGNOSIS — I739 Peripheral vascular disease, unspecified: Secondary | ICD-10-CM

## 2019-09-28 LAB — POCT URINALYSIS DIPSTICK
Bilirubin, UA: NEGATIVE
Blood, UA: NEGATIVE
Glucose, UA: NEGATIVE
Ketones, UA: NEGATIVE
Nitrite, UA: NEGATIVE
Protein, UA: POSITIVE — AB
Spec Grav, UA: 1.015 (ref 1.010–1.025)
Urobilinogen, UA: 0.2 E.U./dL
pH, UA: 5.5 (ref 5.0–8.0)

## 2019-09-28 LAB — POCT UA - MICROALBUMIN
Albumin/Creatinine Ratio, Urine, POC: >300
Creatinine, POC: 100 mg/dL
Microalbumin Ur, POC: 150 mg/L

## 2019-09-28 NOTE — Addendum Note (Signed)
Addended by: Kellie Simmering on: 09/28/2019 03:58 PM   Modules accepted: Orders

## 2019-09-28 NOTE — Progress Notes (Signed)
This visit occurred during the SARS-CoV-2 public health emergency.  Safety protocols were in place, including screening questions prior to the visit, additional usage of staff PPE, and extensive cleaning of exam room while observing appropriate contact time as indicated for disinfecting solutions.  Subjective:   Miranda Roman is a 74 y.o. female who presents for Medicare Annual (Subsequent) preventive examination.  Review of Systems:  n/a Cardiac Risk Factors include: advanced age (>30mn, >>44women);diabetes mellitus;dyslipidemia;hypertension;sedentary lifestyle     Objective:     Vitals: BP 122/64 (BP Location: Left Arm, Patient Position: Sitting, Cuff Size: Normal)   Pulse 88   Temp 98.9 F (37.2 C) (Oral)   Ht 5' 4.8" (1.646 m)   Wt 171 lb 9.6 oz (77.8 kg)   SpO2 97%   BMI 28.73 kg/m   Body mass index is 28.73 kg/m.  Advanced Directives 09/28/2019 02/28/2019 02/26/2019 09/16/2018 09/29/2016  Does Patient Have a Medical Advance Directive? Yes Yes Yes No No  Type of AAcademic librarianLiving will HFox RiverLiving will - -  Does patient want to make changes to medical advance directive? - No - Patient declined - - -  Copy of [place] Chart? No - copy requested - - - -  Would patient like information on creating a medical advance directive? - - - No - Patient declined No - Patient declined    Tobacco Social History   Tobacco Use  Smoking Status Former Smoker  . Packs/day: 0.25  . Years: 40.00  . Pack years: 10.00  . Types: Cigarettes  . Quit date: 04/2005  . Years since quitting: 14.4  Smokeless Tobacco Never Used     Counseling given: Not Answered   Clinical Intake:  Pre-visit preparation completed: Yes  Pain : 0-10 Pain Score: 3  Pain Type: Acute pain Pain Location: Hip Pain Orientation: Right Pain Radiating Towards: down leg Pain Descriptors / Indicators: Discomfort Pain Onset: More  than a month ago Pain Frequency: Intermittent Pain Relieving Factors: rest helps  Pain Relieving Factors: rest helps  Nutritional Status: BMI 25 -29 Overweight Nutritional Risks: None Diabetes: Yes  How often do you need to have someone help you when you read instructions, pamphlets, or other written materials from your doctor or pharmacy?: 1 - Never What is the last grade level you completed in school?: bachelor's degree  Interpreter Needed?: No  Information entered by :: NAllen LPN  Past Medical History:  Diagnosis Date  . Anemia   . Diabetes mellitus   . Hyperlipidemia   . Hypertension   . Osteoporosis   . Sickle cell anemia (HCC)    Past Surgical History:  Procedure Laterality Date  . BONE RESECTION  01/2006   wrist  . 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 TUBAL LIGATION  04/28/1977   Family History  Problem Relation Age of Onset  . Diabetes Mother   . Heart disease Mother   . Diabetes Father   . Heart disease Father   . Diabetes Sister   . Diabetes Maternal Aunt   . Diabetes Maternal Uncle   . Diabetes Maternal Grandmother   . Diabetes Maternal Grandfather    Social History   Socioeconomic History  . Marital status: Widowed    Spouse name: Not on file  . Number of children: Not on file  . Years of education: Not on file  . Highest education level: Not on file  Occupational History  . Occupation: retired  Tobacco Use  . Smoking status: Former Smoker    Packs/day: 0.25    Years: 40.00    Pack years: 10.00    Types: Cigarettes    Quit date: 04/2005    Years since quitting: 14.4  . Smokeless tobacco: Never Used  Substance and Sexual Activity  . Alcohol use: Not Currently    Comment: occasionally  . Drug use: No  . Sexual activity: Not Currently  Other Topics Concern  . Not on file  Social History Narrative  . Not on file   Social Determinants of Health   Financial Resource Strain: Low Risk   . Difficulty of Paying Living Expenses: Not hard at all    Food Insecurity: No Food Insecurity  . Worried About Charity fundraiser in the Last Year: Never true  . Ran Out of Food in the Last Year: Never true  Transportation Needs: No Transportation Needs  . Lack of Transportation (Medical): No  . Lack of Transportation (Non-Medical): No  Physical Activity: Inactive  . Days of Exercise per Week: 0 days  . Minutes of Exercise per Session: 0 min  Stress: No Stress Concern Present  . Feeling of Stress : Not at all  Social Connections:   . Frequency of Communication with Friends and Family:   . Frequency of Social Gatherings with Friends and Family:   . Attends Religious Services:   . Active Member of Clubs or Organizations:   . Attends Archivist Meetings:   [name] Marital Status:     Outpatient Encounter Medications as of 09/28/2019  Medication Sig  . acetaminophen (TYLENOL) 500 MG tablet Take 500 mg by mouth every 6 (six) hours as needed for moderate pain.  [name] amLODipine (NORVASC) 2.5 MG tablet Take 2.5 mg by mouth daily.  [name] atorvastatin (LIPITOR) 40 MG tablet Take 40 mg by mouth daily.  . calcitRIOL (ROCALTROL) 0.25 MCG capsule Take 0.5-1 mcg by mouth daily. 3 tablets daily  . DEXILANT 60 MG capsule TAKE 1 CAPSULE BY MOUTH  DAILY  . epoetin alfa (EPOGEN,PROCRIT) 60737 UNIT/ML injection 10 Units once. Takes every 2 weeks  . glucose blood (ONETOUCH VERIO) test strip Use as instructed to check blood sugars 1 time per day e11.22  . insulin lispro (HUMALOG) 100 UNIT/ML injection Inject 0.04 mLs (4 Units total) into the skin 3 (three) times daily before meals. Sliding scale (Patient taking differently: Inject 4 Units into the skin 3 (three) times daily before meals. )  . LEVEMIR FLEXTOUCH 100 UNIT/ML Pen INJECT SUBCUTANEOUSLY 16  UNITS DAILY  . loratadine (CLARITIN) 10 MG tablet Take 1 tablet (10 mg total) by mouth daily.  Glory Rosebush DELICA LANCETS 10G MISC 1 each by Does not apply route daily. DX: E11.65   No facility-administered encounter  medications on file as of 09/28/2019.    Activities of Daily Living In your present state of health, do you have any difficulty performing the following activities: 09/28/2019 02/28/2019  Hearing? N -  Vision? N -  Difficulty concentrating or making decisions? N -  Walking or climbing stairs? N -  Comment - -  Dressing or bathing? N -  Doing errands, shopping? N N  Preparing Food and eating ? N -  Using the Toilet? N -  In the past six months, have you accidently leaked urine? Y -  Comment if held too long -  Do you have problems with loss of bowel control? Y -  Comment in last year -  Managing your Medications? N -  Managing your Finances? N -  Housekeeping or managing your Housekeeping? N -  Some recent data might be hidden    Patient Care Team: Glendale Chard, MD as PCP - General (Internal Medicine) Foye Spurling, MD as Consulting Physician (Endocrinology) Debara Pickett Nadean Corwin, MD as Consulting Physician (Cardiology) [name], MD as Consulting Physician (General Surgery) Rutherford Guys, MD as Consulting Physician (Ophthalmology)    Assessment:   This is a routine wellness examination for Harli.  Exercise Activities and Dietary recommendations Current Exercise Habits: The patient does not participate in regular exercise at present  Goals    . Exercise 150 min/wk Moderate Activity    . Weight (lb) < 200 lb (90.7 kg)     09/28/2019, wants to get 145-150 pounds       Fall Risk Fall Risk  09/28/2019 03/03/2019 09/16/2018 08/24/2018 08/04/2018  Falls in the past year? 0 0 0 0 0  Risk for fall due to : Medication side effect - Medication side effect - -  Follow up Falls evaluation completed;Education provided;Falls prevention discussed - Falls prevention discussed;Education provided - -   Is the patient's home free of loose throw rugs in walkways, pet beds, electrical cords, etc?   yes      Grab bars in the bathroom? yes      Handrails on the stairs?   n/a      Adequate  lighting?   yes  Timed Get Up and Go performed: n/a  Depression Screen PHQ 2/9 Scores 09/28/2019 03/03/2019 09/16/2018 08/24/2018  PHQ - 2 Score 0 0 0 0  PHQ- 9 Score 0 - 0 -     Cognitive Function     6CIT Screen 09/28/2019 09/16/2018  What Year? 0 points 0 points  What month? 0 points 0 points  What time? 0 points 0 points  Count back from 20 0 points 0 points  Months in reverse 0 points 0 points  Repeat phrase 0 points 0 points  Total Score 0 0    Immunization History  Administered Date(s) Administered  . Influenza-Unspecified 03/16/2019  . PFIZER SARS-COV-2 Vaccination 07/29/2019, 08/19/2019  . Pneumococcal Conjugate-13 06/10/2019  . Pneumococcal Polysaccharide-23 07/09/2016    Qualifies for Shingles Vaccine? yes  Screening Tests Health Maintenance  Topic Date Due  . HEMOGLOBIN A1C  10/25/2019  . FOOT EXAM  12/15/2019  . OPHTHALMOLOGY EXAM  02/24/2020  . URINE MICROALBUMIN  09/27/2020  . MAMMOGRAM  02/03/2021  . COLONOSCOPY  01/19/2022  . TETANUS/TDAP  04/08/2023  . INFLUENZA VACCINE  Completed  . DEXA SCAN  Completed  . Hepatitis C Screening  Completed  . PNA vac Low Risk Adult  Completed    Cancer Screenings: Lung: Low Dose CT Chest recommended if Age 42-80 years, 30 pack-year currently smoking OR have quit w/in 15years. Patient does not qualify. Breast:  Up to date on Mammogram? Yes   Up to date of Bone Density/Dexa? Yes Colorectal: up to date  Additional Screenings: : Hepatitis C Screening: 09/11/2015     Plan:    Patient wants to get to 145-150 pounds.   I have personally reviewed and noted the following in the patient's chart:   . Medical and social history . Use of alcohol, tobacco or illicit drugs  . Current medications and supplements . Functional ability and status . Nutritional status . Physical activity . Advanced directives . List of other physicians . Hospitalizations, surgeries, and ER visits in previous 32  months . Vitals . Screenings to include cognitive, depression, and falls . Referrals and appointments  In addition, I have reviewed and discussed with patient certain preventive protocols, quality metrics, and best practice recommendations. A written personalized care plan for preventive services as well as general preventive health recommendations were provided to patient.     Kellie Simmering, LPN  [phone]

## 2019-09-28 NOTE — Progress Notes (Signed)
This visit occurred during the SARS-CoV-2 public health emergency.  Safety protocols were in place, including screening questions prior to the visit, additional usage of staff PPE, and extensive cleaning of exam room while observing appropriate contact time as indicated for disinfecting solutions.  Subjective:     Patient ID: Miranda Roman , female    DOB: 25-Jan-1946 , 74 y.o.   MRN: [id number]   Chief Complaint  Patient presents with  . Annual Exam  . Diabetes  . Hypertension    HPI  She is here today for a full physical examination. She is followed by Dr. Mancel Bale for her GYN care. She admits to performing SBE.  Diabetes She presents for her follow-up diabetic visit. She has type 2 diabetes mellitus. Her disease course has been stable. There are no hypoglycemic associated symptoms. There are no diabetic associated symptoms. Pertinent negatives for diabetes include no blurred vision and no chest pain. There are no hypoglycemic complications. Diabetic complications include nephropathy. Risk factors for coronary artery disease include diabetes mellitus, dyslipidemia, hypertension, sedentary lifestyle and post-menopausal. Current diabetic treatment includes insulin injections. She is compliant with treatment most of the time. She is following a diabetic diet. Meal planning includes avoidance of concentrated sweets.  Hypertension This is a chronic problem. The current episode started more than 1 year ago. The problem has been gradually improving since onset. Pertinent negatives include no blurred vision, chest pain, palpitations or shortness of breath. The current treatment provides moderate improvement.     Past Medical History:  Diagnosis Date  . Anemia   . Diabetes mellitus   . Hyperlipidemia   . Hypertension   . Osteoporosis   . Sickle cell anemia (HCC)      Family History  Problem Relation Age of Onset  . Diabetes Mother   . Heart disease Mother   . Diabetes Father   .  Heart disease Father   . Diabetes Sister   . Diabetes Maternal Aunt   . Diabetes Maternal Uncle   . Diabetes Maternal Grandmother   . Diabetes Maternal Grandfather      Current Outpatient Medications:  .  acetaminophen (TYLENOL) 500 MG tablet, Take 500 mg by mouth every 6 (six) hours as needed for moderate pain., Disp: , Rfl:  .  amLODipine (NORVASC) 2.5 MG tablet, Take 2.5 mg by mouth daily., Disp: , Rfl:  .  atorvastatin (LIPITOR) 40 MG tablet, Take 40 mg by mouth daily., Disp: , Rfl:  .  calcitRIOL (ROCALTROL) 0.25 MCG capsule, Take 0.5-1 mcg by mouth daily. 3 tablets daily, Disp: , Rfl:  .  DEXILANT 60 MG capsule, TAKE 1 CAPSULE BY MOUTH  DAILY, Disp: 30 capsule, Rfl: 11 .  epoetin alfa (EPOGEN,PROCRIT) 95638 UNIT/ML injection, 10 Units once. Takes every 2 weeks, Disp: , Rfl:  .  glucose blood (ONETOUCH VERIO) test strip, Use as instructed to check blood sugars 1 time per day e11.22, Disp: 100 each, Rfl: 11 .  insulin lispro (HUMALOG) 100 UNIT/ML injection, Inject 0.04 mLs (4 Units total) into the skin 3 (three) times daily before meals. Sliding scale (Patient taking differently: Inject 4 Units into the skin 3 (three) times daily before meals. ), Disp: 10 mL, Rfl: 2 .  LEVEMIR FLEXTOUCH 100 UNIT/ML Pen, INJECT SUBCUTANEOUSLY 16  UNITS DAILY, Disp: 15 mL, Rfl: 3 .  loratadine (CLARITIN) 10 MG tablet, Take 1 tablet (10 mg total) by mouth daily., Disp: 30 tablet, Rfl: 2 .  ONETOUCH DELICA LANCETS 75I MISC, 1 each  by Does not apply route daily. DX: E11.65, Disp: 150 each, Rfl: 11   No Known Allergies    The patient states she uses post menopausal status for birth control. Last LMP was No LMP recorded. Patient is postmenopausal.. Negative for Dysmenorrhea  Negative for: breast discharge, breast lump(s), breast pain and breast self exam. Associated symptoms include abnormal vaginal bleeding. Pertinent negatives include abnormal bleeding (hematology), anxiety, decreased libido, depression,  difficulty falling sleep, dyspareunia, history of infertility, nocturia, sexual dysfunction, sleep disturbances, urinary incontinence, urinary urgency, vaginal discharge and vaginal itching. Diet regular.The patient states her exercise level is  intermittent.   . The patient's tobacco use is:  Social History   Tobacco Use  Smoking Status Former Smoker  . Packs/day: 0.25  . Years: 40.00  . Pack years: 10.00  . Types: Cigarettes  . Quit date: 04/2005  . Years since quitting: 14.4  Smokeless Tobacco Never Used  . She has been exposed to passive smoke. The patient's alcohol use is:  Social History   Substance and Sexual Activity  Alcohol Use Not Currently   Comment: occasionally   Review of Systems  Constitutional: Negative.   HENT: Negative.   Eyes: Negative.  Negative for blurred vision.  Respiratory: Negative.  Negative for shortness of breath.   Cardiovascular: Negative.  Negative for chest pain and palpitations.  Endocrine: Negative.   Genitourinary: Negative.   Musculoskeletal: Positive for back pain.  Skin: Negative.        She is worried about spot on foot. She is concerned b/c it has been there for months. It is not an open wound.   Allergic/Immunologic: Negative.   Neurological: Negative.   Hematological: Negative.   Psychiatric/Behavioral: Negative.      Today's Vitals   09/28/19 0953  BP: 122/64  Pulse: 88  Temp: 98.9 F (37.2 C)  SpO2: 97%  Weight: 171 lb 9.6 oz (77.8 kg)  Height: 5' 4.8" (1.646 m)  PainSc: 3   PainLoc: Hip   Body mass index is 28.73 kg/m.   Wt Readings from Last 3 Encounters:  09/28/19 171 lb 9.6 oz (77.8 kg)  09/28/19 171 lb 9.6 oz (77.8 kg)  09/26/19 171 lb 3.2 oz (77.7 kg)     Objective:  Physical Exam Vitals and nursing note reviewed.  Constitutional:      Appearance: Normal appearance.  HENT:     Head: Normocephalic and atraumatic.     Right Ear: Tympanic membrane, ear canal and external ear normal.     Left Ear:  Tympanic membrane, ear canal and external ear normal.     Nose:     Comments: Deferred, masked    Mouth/Throat:     Comments: Deferred, masked Eyes:     Extraocular Movements: Extraocular movements intact.     Conjunctiva/sclera: Conjunctivae normal.     Pupils: Pupils are equal, round, and reactive to light.  Cardiovascular:     Rate and Rhythm: Normal rate and regular rhythm.     Pulses:          Dorsalis pedis pulses are 1+ on the right side and 1+ on the left side.     Heart sounds: Normal heart sounds.  Pulmonary:     Effort: Pulmonary effort is normal.     Breath sounds: Normal breath sounds.  Chest:     Breasts: Tanner Score is 5.        Right: Normal.        Left: Normal.  Abdominal:  General: Abdomen is flat. Bowel sounds are normal.     Palpations: Abdomen is soft.  Genitourinary:    Comments: deferred Musculoskeletal:        General: Normal range of motion.     Cervical back: Normal range of motion and neck supple.  Feet:     Right foot:     Protective Sensation: 5 sites tested. 5 sites sensed.     Skin integrity: Dry skin present.     Toenail Condition: Right toenails are normal.     Left foot:     Protective Sensation: 5 sites tested. 5 sites sensed.     Skin integrity: Dry skin present.     Toenail Condition: Left toenails are normal.  Skin:    General: Skin is warm and dry.     Comments: Flesh-colored papule on left foot, no vesicular lesions noted. No erythema. Also with hyperpigmented lesions on upper abdomen.   Neurological:     General: No focal deficit present.     Mental Status: She is alert and oriented to person, place, and time.  Psychiatric:        Mood and Affect: Mood normal.        Behavior: Behavior normal.         Assessment And Plan:   1. Routine general medical examination at health care facility  CPE performed, minus pelvic exam. Importance of monthly self breast exams was discussed with the patient. PATIENT IS ADVISED TO GET  30-45 MINUTES REGULAR EXERCISE NO LESS THAN FOUR TO FIVE DAYS PER WEEK - BOTH WEIGHTBEARING EXERCISES AND AEROBIC ARE RECOMMENDED.  SHE IS ADVISED TO FOLLOW A HEALTHY DIET WITH AT LEAST SIX FRUITS/VEGGIES PER DAY, DECREASE INTAKE OF RED MEAT, AND TO INCREASE FISH INTAKE TO TWO DAYS PER WEEK.  MEATS/FISH SHOULD NOT BE FRIED, BAKED OR BROILED IS PREFERABLE.  I SUGGEST WEARING SPF 50 SUNSCREEN ON EXPOSED PARTS AND ESPECIALLY WHEN IN THE DIRECT SUNLIGHT FOR AN EXTENDED PERIOD OF TIME.  PLEASE AVOID FAST FOOD RESTAURANTS AND INCREASE YOUR WATER INTAKE.   2. 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  I DISCUSSED WITH THE PATIENT AT LENGTH REGARDING THE GOALS OF GLYCEMIC CONTROL AND POSSIBLE LONG-TERM COMPLICATIONS.  I  ALSO STRESSED THE IMPORTANCE OF COMPLIANCE WITH HOME GLUCOSE MONITORING, DIETARY RESTRICTIONS INCLUDING AVOIDANCE OF SUGARY DRINKS/PROCESSED FOODS,  ALONG WITH REGULAR EXERCISE.  I  ALSO STRESSED THE IMPORTANCE OF ANNUAL EYE EXAMS, SELF FOOT CARE AND COMPLIANCE WITH OFFICE VISITS.  - CMP14+EGFR - CBC - Lipid panel - Hemoglobin A1c - Fructosamine  3. Hypertensive nephropathy  Chronic, well controlled. She will continue with current meds. She is encouraged to avoid adding salt to her foods. EKG performed, NSR w/o acute changes. She will rto in six months for re-evaluation.   - EKG 12-Lead  4. PAD (peripheral artery disease) (HCC)  Chronic. She is encouraged to stay active and continue with statin therapy.   5. Sciatica of right side  She agrees to PT evaluation. Previous MRI lumbar spine reviewed in full detail during her visit. I do not think she needs further imaging at this time.   - Ambulatory referral to Physical Therapy  6. Overweight with body mass index (BMI) of 28 to 28.9 in adult  Her weight is acceptable for her demographic. She is encouraged to exercise 30 minutes five days per week. She is advised to slowly  increase her activity as tolerated.   7.  Skin lesion  She was advised to apply OTC HC cream to affected area on left foot. I will refer her to Derm for further evaluation of hyperpigmented skin lesions on her torso.   - Ambulatory referral to Dermatology   [name], MD    THE PATIENT IS ENCOURAGED TO PRACTICE SOCIAL DISTANCING DUE TO THE COVID-19 PANDEMIC.

## 2019-09-28 NOTE — Patient Instructions (Signed)
Health Maintenance, Female Adopting a healthy lifestyle and getting preventive care are important in promoting health and wellness. Ask your health care provider about:  The right schedule for you to have regular tests and exams.  Things you can do on your own to prevent diseases and keep yourself healthy. What should I know about diet, weight, and exercise? Eat a healthy diet   Eat a diet that includes plenty of vegetables, fruits, low-fat dairy products, and lean protein.  Do not eat a lot of foods that are high in solid fats, added sugars, or sodium. Maintain a healthy weight Body mass index (BMI) is used to identify weight problems. It estimates body fat based on height and weight. Your health care provider can help determine your BMI and help you achieve or maintain a healthy weight. Get regular exercise Get regular exercise. This is one of the most important things you can do for your health. Most adults should:  Exercise for at least 150 minutes each week. The exercise should increase your heart rate and make you sweat (moderate-intensity exercise).  Do strengthening exercises at least twice a week. This is in addition to the moderate-intensity exercise.  Spend less time sitting. Even light physical activity can be beneficial. Watch cholesterol and blood lipids Have your blood tested for lipids and cholesterol at 73 years of age, then have this test every 5 years. Have your cholesterol levels checked more often if:  Your lipid or cholesterol levels are high.  You are older than 73 years of age.  You are at high risk for heart disease. What should I know about cancer screening? Depending on your health history and family history, you may need to have cancer screening at various ages. This may include screening for:  Breast cancer.  Cervical cancer.  Colorectal cancer.  Skin cancer.  Lung cancer. What should I know about heart disease, diabetes, and high blood  pressure? Blood pressure and heart disease  High blood pressure causes heart disease and increases the risk of stroke. This is more likely to develop in people who have high blood pressure readings, are of African descent, or are overweight.  Have your blood pressure checked: ? Every 3-5 years if you are 73-73 years of age. ? Every year if you are 73 years old or older. Diabetes Have regular diabetes screenings. This checks your fasting blood sugar level. Have the screening done:  Once every three years after age 73 if you are at a normal weight and have a low risk for diabetes.  More often and at a younger age if you are overweight or have a high risk for diabetes. What should I know about preventing infection? Hepatitis B If you have a higher risk for hepatitis B, you should be screened for this virus. Talk with your health care provider to find out if you are at risk for hepatitis B infection. Hepatitis C Testing is recommended for:  Everyone born from [birth year] through 1964.  Anyone with known risk factors for hepatitis C. Sexually transmitted infections (STIs)  Get screened for STIs, including gonorrhea and chlamydia, if: ? You are sexually active and are younger than 73 years of age. ? You are older than 73 years of age and your health care provider tells you that you are at risk for this type of infection. ? Your sexual activity has changed since you were last screened, and you are at increased risk for chlamydia or gonorrhea. Ask your health care provider if  you are at risk.  Ask your health care provider about whether you are at high risk for HIV. Your health care provider may recommend a prescription medicine to help prevent HIV infection. If you choose to take medicine to prevent HIV, you should first get tested for HIV. You should then be tested every 3 months for as long as you are taking the medicine. Pregnancy  If you are about to stop having your period (premenopausal) and  you may become pregnant, seek counseling before you get pregnant.  Take 400 to 800 micrograms (mcg) of folic acid every day if you become pregnant.  Ask for birth control (contraception) if you want to prevent pregnancy. Osteoporosis and menopause Osteoporosis is a disease in which the bones lose minerals and strength with aging. This can result in bone fractures. If you are 89 years old or older, or if you are at risk for osteoporosis and fractures, ask your health care provider if you should:  Be screened for bone loss.  Take a calcium or vitamin D supplement to lower your risk of fractures.  Be given hormone replacement therapy (HRT) to treat symptoms of menopause. Follow these instructions at home: Lifestyle  Do not use any products that contain nicotine or tobacco, such as cigarettes, e-cigarettes, and chewing tobacco. If you need help quitting, ask your health care provider.  Do not use street drugs.  Do not share needles.  Ask your health care provider for help if you need support or information about quitting drugs. Alcohol use  Do not drink alcohol if: ? Your health care provider tells you not to drink. ? You are pregnant, may be pregnant, or are planning to become pregnant.  If you drink alcohol: ? Limit how much you use to 0-1 drink a day. ? Limit intake if you are breastfeeding.  Be aware of how much alcohol is in your drink. In the [place] one drink equals one 12 oz bottle of beer (355 mL), one 5 oz glass of wine (148 mL), or one 1 oz glass of hard liquor (44 mL). General instructions  Schedule regular health, dental, and eye exams.  Stay current with your vaccines.  Tell your health care provider if: ? You often feel depressed. ? You have ever been abused or do not feel safe at home. Summary  Adopting a healthy lifestyle and getting preventive care are important in promoting health and wellness.  Follow your health care provider's instructions about healthy  diet, exercising, and getting tested or screened for diseases.  Follow your health care provider's instructions on monitoring your cholesterol and blood pressure. This information is not intended to replace advice given to you by your health care provider. Make sure you discuss any questions you have with your health care provider. Document Revised: 06/16/2018 Document Reviewed: 06/16/2018 Elsevier Patient Education  2020 [employer].

## 2019-09-28 NOTE — Patient Instructions (Signed)
Miranda Roman , Thank you for taking time to come for your Medicare Wellness Visit. I appreciate your ongoing commitment to your health goals. Please review the following plan we discussed and let me know if I can assist you in the future.   Screening recommendations/referrals: Colonoscopy: 01/2012 Mammogram: 01/2019 Bone Density: 03/2019 Recommended yearly ophthalmology/optometry visit for glaucoma screening and checkup Recommended yearly dental visit for hygiene and checkup  Vaccinations: Influenza vaccine: 03/2019 Pneumococcal vaccine: 06/2019 Tdap vaccine: 04/2013 Shingles vaccine: discussed    Advanced directives: Please bring a copy of your POA (Power of [place]) and/or Living Will to your next appointment.    Conditions/risks identified: overweight  Next appointment: 3/30/2022at 8:30   Preventive Care 17 Years and Older, Female Preventive care refers to lifestyle choices and visits with your health care provider that can promote health and wellness. What does preventive care include?  A yearly physical exam. This is also called an annual well check.  Dental exams once or twice a year.  Routine eye exams. Ask your health care provider how often you should have your eyes checked.  Personal lifestyle choices, including:  Daily care of your teeth and gums.  Regular physical activity.  Eating a healthy diet.  Avoiding tobacco and drug use.  Limiting alcohol use.  Practicing safe sex.  Taking low-dose aspirin every day.  Taking vitamin and mineral supplements as recommended by your health care provider. What happens during an annual well check? The services and screenings done by your health care provider during your annual well check will depend on your age, overall health, lifestyle risk factors, and family history of disease. Counseling  Your health care provider may ask you questions about your:  Alcohol use.  Tobacco use.  Drug use.  Emotional  well-being.  Home and relationship well-being.  Sexual activity.  Eating habits.  History of falls.  Memory and ability to understand (cognition).  Work and work Statistician.  Reproductive health. Screening  You may have the following tests or measurements:  Height, weight, and BMI.  Blood pressure.  Lipid and cholesterol levels. These may be checked every 5 years, or more frequently if you are over 37 years old.  Skin check.  Lung cancer screening. You may have this screening every year starting at age 9 if you have a 30-pack-year history of smoking and currently smoke or have quit within the past 15 years.  Fecal occult blood test (FOBT) of the stool. You may have this test every year starting at age 50.  Flexible sigmoidoscopy or colonoscopy. You may have a sigmoidoscopy every 5 years or a colonoscopy every 10 years starting at age 9.  Hepatitis C blood test.  Hepatitis B blood test.  Sexually transmitted disease (STD) testing.  Diabetes screening. This is done by checking your blood sugar (glucose) after you have not eaten for a while (fasting). You may have this done every 1-3 years.  Bone density scan. This is done to screen for osteoporosis. You may have this done starting at age 49.  Mammogram. This may be done every 1-2 years. Talk to your health care provider about how often you should have regular mammograms. Talk with your health care provider about your test results, treatment options, and if necessary, the need for more tests. Vaccines  Your health care provider may recommend certain vaccines, such as:  Influenza vaccine. This is recommended every year.  Tetanus, diphtheria, and acellular pertussis (Tdap, Td) vaccine. You may need a Td booster every  10 years.  Zoster vaccine. You may need this after age 50.  Pneumococcal 13-valent conjugate (PCV13) vaccine. One dose is recommended after age 75.  Pneumococcal polysaccharide (PPSV23) vaccine. One  dose is recommended after age 72. Talk to your health care provider about which screenings and vaccines you need and how often you need them. This information is not intended to replace advice given to you by your health care provider. Make sure you discuss any questions you have with your health care provider. Document Released: 07/20/2015 Document Revised: 03/12/2016 Document Reviewed: 04/24/2015 Elsevier Interactive Patient Education  2017 [place] Prevention in the Home Falls can cause injuries. They can happen to people of all ages. There are many things you can do to make your home safe and to help prevent falls. What can I do on the outside of my home?  Regularly fix the edges of walkways and driveways and fix any cracks.  Remove anything that might make you trip as you walk through a door, such as a raised step or threshold.  Trim any bushes or trees on the path to your home.  Use bright outdoor lighting.  Clear any walking paths of anything that might make someone trip, such as rocks or tools.  Regularly check to see if handrails are loose or broken. Make sure that both sides of any steps have handrails.  Any raised decks and porches should have guardrails on the edges.  Have any leaves, snow, or ice cleared regularly.  Use sand or salt on walking paths during winter.  Clean up any spills in your garage right away. This includes oil or grease spills. What can I do in the bathroom?  Use night lights.  Install grab bars by the toilet and in the tub and shower. Do not use towel bars as grab bars.  Use non-skid mats or decals in the tub or shower.  If you need to sit down in the shower, use a plastic, non-slip stool.  Keep the floor dry. Clean up any water that spills on the floor as soon as it happens.  Remove soap buildup in the tub or shower regularly.  Attach bath mats securely with double-sided non-slip rug tape.  Do not have throw rugs and other  things on the floor that can make you trip. What can I do in the bedroom?  Use night lights.  Make sure that you have a light by your bed that is easy to reach.  Do not use any sheets or blankets that are too big for your bed. They should not hang down onto the floor.  Have a firm chair that has side arms. You can use this for support while you get dressed.  Do not have throw rugs and other things on the floor that can make you trip. What can I do in the kitchen?  Clean up any spills right away.  Avoid walking on wet floors.  Keep items that you use a lot in easy-to-reach places.  If you need to reach something above you, use a strong step stool that has a grab bar.  Keep electrical cords out of the way.  Do not use floor polish or wax that makes floors slippery. If you must use wax, use non-skid floor wax.  Do not have throw rugs and other things on the floor that can make you trip. What can I do with my stairs?  Do not leave any items on the stairs.  Make sure  that there are handrails on both sides of the stairs and use them. Fix handrails that are broken or loose. Make sure that handrails are as long as the stairways.  Check any carpeting to make sure that it is firmly attached to the stairs. Fix any carpet that is loose or worn.  Avoid having throw rugs at the top or bottom of the stairs. If you do have throw rugs, attach them to the floor with carpet tape.  Make sure that you have a light switch at the top of the stairs and the bottom of the stairs. If you do not have them, ask someone to add them for you. What else can I do to help prevent falls?  Wear shoes that:  Do not have high heels.  Have rubber bottoms.  Are comfortable and fit you well.  Are closed at the toe. Do not wear sandals.  If you use a stepladder:  Make sure that it is fully opened. Do not climb a closed stepladder.  Make sure that both sides of the stepladder are locked into place.  Ask  someone to hold it for you, if possible.  Clearly mark and make sure that you can see:  Any grab bars or handrails.  First and last steps.  Where the edge of each step is.  Use tools that help you move around (mobility aids) if they are needed. These include:  Canes.  Walkers.  Scooters.  Crutches.  Turn on the lights when you go into a dark area. Replace any light bulbs as soon as they burn out.  Set up your furniture so you have a clear path. Avoid moving your furniture around.  If any of your floors are uneven, fix them.  If there are any pets around you, be aware of where they are.  Review your medicines with your doctor. Some medicines can make you feel dizzy. This can increase your chance of falling. Ask your doctor what other things that you can do to help prevent falls. This information is not intended to replace advice given to you by your health care provider. Make sure you discuss any questions you have with your health care provider. Document Released: 04/19/2009 Document Revised: 11/29/2015 Document Reviewed: 07/28/2014 Elsevier Interactive Patient Education  2017 [employer].

## 2019-09-29 LAB — CMP14+EGFR
ALT: 22 IU/L (ref 0–32)
AST: 21 IU/L (ref 0–40)
Albumin/Globulin Ratio: 1.9 (ref 1.2–2.2)
Albumin: 4.3 g/dL (ref 3.7–4.7)
Alkaline Phosphatase: 58 IU/L (ref 39–117)
BUN/Creatinine Ratio: 19 (ref 12–28)
BUN: 45 mg/dL — ABNORMAL HIGH (ref 8–27)
Bilirubin Total: 0.4 mg/dL (ref 0.0–1.2)
CO2: 19 mmol/L — ABNORMAL LOW (ref 20–29)
Calcium: 9.2 mg/dL (ref 8.7–10.3)
Chloride: 107 mmol/L — ABNORMAL HIGH (ref 96–106)
Creatinine, Ser: 2.36 mg/dL — ABNORMAL HIGH (ref 0.57–1.00)
GFR calc Af Amer: 23 mL/min/{1.73_m2} — ABNORMAL LOW (ref >59)
GFR calc non Af Amer: 20 mL/min/{1.73_m2} — ABNORMAL LOW (ref >59)
Globulin, Total: 2.3 g/dL (ref 1.5–4.5)
Glucose: 109 mg/dL — ABNORMAL HIGH (ref 65–99)
Potassium: 4 mmol/L (ref 3.5–5.2)
Sodium: 143 mmol/L (ref 134–144)
Total Protein: 6.6 g/dL (ref 6.0–8.5)

## 2019-09-29 LAB — FRUCTOSAMINE: Fructosamine: 301 umol/L — ABNORMAL HIGH (ref 0–285)

## 2019-09-29 LAB — CBC
Hematocrit: 36.1 % (ref 34.0–46.6)
Hemoglobin: 10.8 g/dL — ABNORMAL LOW (ref 11.1–15.9)
MCH: 21.9 pg — ABNORMAL LOW (ref 26.6–33.0)
MCHC: 29.9 g/dL — ABNORMAL LOW (ref 31.5–35.7)
MCV: 73 fL — ABNORMAL LOW (ref 79–97)
Platelets: 200 10*3/uL (ref 150–450)
RBC: 4.93 x10E6/uL (ref 3.77–5.28)
RDW: 17.1 % — ABNORMAL HIGH (ref 11.7–15.4)
WBC: 8 10*3/uL (ref 3.4–10.8)

## 2019-09-29 LAB — LIPID PANEL
Chol/HDL Ratio: 2.7 ratio (ref 0.0–4.4)
Cholesterol, Total: 140 mg/dL (ref 100–199)
HDL: 51 mg/dL (ref >39)
LDL Chol Calc (NIH): 74 mg/dL (ref 0–99)
Triglycerides: 74 mg/dL (ref 0–149)
VLDL Cholesterol Cal: 15 mg/dL (ref 5–40)

## 2019-09-29 LAB — HEMOGLOBIN A1C
Est. average glucose Bld gHb Est-mCnc: 128 mg/dL
Hgb A1c MFr Bld: 6.1 % — ABNORMAL HIGH (ref 4.8–5.6)

## 2019-10-07 DIAGNOSIS — M545 Low back pain: Secondary | ICD-10-CM | POA: Diagnosis not present

## 2019-10-07 DIAGNOSIS — M5416 Radiculopathy, lumbar region: Secondary | ICD-10-CM | POA: Diagnosis not present

## 2019-10-07 DIAGNOSIS — M79604 Pain in right leg: Secondary | ICD-10-CM | POA: Diagnosis not present

## 2019-10-11 ENCOUNTER — Encounter (HOSPITAL_COMMUNITY)
Admission: RE | Admit: 2019-10-11 | Discharge: 2019-10-11 | Disposition: A | Discharge status: Home / Self Care | Payer: Medicare Other | Source: Ambulatory Visit | Attending: Nephrology | Admitting: Nephrology

## 2019-10-11 ENCOUNTER — Other Ambulatory Visit: Payer: Self-pay

## 2019-10-11 VITALS — BP 151/76 | HR 79 | Temp 95.1°F | Resp 20

## 2019-10-11 DIAGNOSIS — N184 Chronic kidney disease, stage 4 (severe): Secondary | ICD-10-CM | POA: Insufficient documentation

## 2019-10-11 DIAGNOSIS — E1122 Type 2 diabetes mellitus with diabetic chronic kidney disease: Secondary | ICD-10-CM | POA: Insufficient documentation

## 2019-10-11 LAB — POCT HEMOGLOBIN-HEMACUE: Hemoglobin: 10.5 g/dL — ABNORMAL LOW (ref 12.0–15.0)

## 2019-10-11 MED ORDER — EPOETIN ALFA 10000 UNIT/ML IJ SOLN: 10000.0000 [IU] | INTRAMUSCULAR | Status: DC

## 2019-10-11 MED ORDER — EPOETIN ALFA 10000 UNIT/ML IJ SOLN
INTRAMUSCULAR | Status: AC
  Administered 2019-10-11: 10000 [IU]
  Filled 2019-10-11: qty 1

## 2019-10-14 DIAGNOSIS — M79604 Pain in right leg: Secondary | ICD-10-CM | POA: Diagnosis not present

## 2019-10-14 DIAGNOSIS — M5416 Radiculopathy, lumbar region: Secondary | ICD-10-CM | POA: Diagnosis not present

## 2019-10-14 DIAGNOSIS — M545 Low back pain: Secondary | ICD-10-CM | POA: Diagnosis not present

## 2019-10-21 DIAGNOSIS — I10 Essential (primary) hypertension: Secondary | ICD-10-CM | POA: Diagnosis not present

## 2019-10-21 DIAGNOSIS — E119 Type 2 diabetes mellitus without complications: Secondary | ICD-10-CM | POA: Diagnosis not present

## 2019-10-21 DIAGNOSIS — Z794 Long term (current) use of insulin: Secondary | ICD-10-CM | POA: Diagnosis not present

## 2019-10-21 DIAGNOSIS — N184 Chronic kidney disease, stage 4 (severe): Secondary | ICD-10-CM | POA: Diagnosis not present

## 2019-10-25 ENCOUNTER — Encounter (HOSPITAL_COMMUNITY)
Admission: RE | Admit: 2019-10-25 | Discharge: 2019-10-25 | Disposition: A | Discharge status: Home / Self Care | Payer: Medicare Other | Source: Ambulatory Visit | Attending: Nephrology | Admitting: Nephrology

## 2019-10-25 ENCOUNTER — Other Ambulatory Visit: Payer: Self-pay

## 2019-10-25 VITALS — BP 145/73 | HR 81 | Temp 95.0°F | Resp 20

## 2019-10-25 DIAGNOSIS — E1122 Type 2 diabetes mellitus with diabetic chronic kidney disease: Secondary | ICD-10-CM

## 2019-10-25 DIAGNOSIS — N184 Chronic kidney disease, stage 4 (severe): Secondary | ICD-10-CM | POA: Diagnosis not present

## 2019-10-25 LAB — IRON AND TIBC
Iron: 70 ug/dL (ref 28–170)
Saturation Ratios: 28 % (ref 10.4–31.8)
TIBC: 249 ug/dL — ABNORMAL LOW (ref 250–450)
UIBC: 179 ug/dL

## 2019-10-25 LAB — FERRITIN: Ferritin: 93 ng/mL (ref 11–307)

## 2019-10-25 LAB — POCT HEMOGLOBIN-HEMACUE: Hemoglobin: 10.9 g/dL — ABNORMAL LOW (ref 12.0–15.0)

## 2019-10-25 MED ORDER — EPOETIN ALFA 10000 UNIT/ML IJ SOLN: 10000.0000 [IU] | INTRAMUSCULAR | Status: DC

## 2019-10-25 MED ORDER — EPOETIN ALFA 10000 UNIT/ML IJ SOLN
INTRAMUSCULAR | Status: AC
  Administered 2019-10-25: 10000 [IU] via SUBCUTANEOUS
  Filled 2019-10-25: qty 1

## 2019-11-01 DIAGNOSIS — M545 Low back pain: Secondary | ICD-10-CM | POA: Diagnosis not present

## 2019-11-01 DIAGNOSIS — N184 Chronic kidney disease, stage 4 (severe): Secondary | ICD-10-CM | POA: Diagnosis not present

## 2019-11-01 DIAGNOSIS — M79604 Pain in right leg: Secondary | ICD-10-CM | POA: Diagnosis not present

## 2019-11-01 DIAGNOSIS — M5416 Radiculopathy, lumbar region: Secondary | ICD-10-CM | POA: Diagnosis not present

## 2019-11-07 ENCOUNTER — Telehealth: Payer: Self-pay | Admitting: Internal Medicine

## 2019-11-07 NOTE — Chronic Care Management (AMB) (Signed)
  Chronic Care Management   Note  11/07/2019 Name: Miranda Roman MRN: [id number] DOB: 1945/10/02  [name] is a 74 y.o. year old female who is a primary care patient of Glendale Chard, MD. I reached out to Sandy Salaam by phone today in response to a referral sent by [name]'s health plan.     [name] was given information about Chronic Care Management services today including:  1. CCM service includes personalized support from designated clinical staff supervised by her physician, including individualized plan of care and coordination with other care providers 2. 24/7 contact phone numbers for assistance for urgent and routine care needs. 3. Service will only be billed when office clinical staff spend 20 minutes or more in a month to coordinate care. 4. Only one practitioner may furnish and bill the service in a calendar month. 5. The patient may stop CCM services at any time (effective at the end of the month) by phone call to the office staff. 6. The patient will be responsible for cost sharing (co-pay) of up to 20% of the service fee (after annual deductible is met).  Patient did not agree to enrollment in care management services and does not wish to consider at this time due to co-pay.  Follow up plan: The patient has been provided with contact information for the care management team and has been advised to call with any health related questions or concerns.   [place], [place] [address] Direct Dial: [phone] [email or web address]: [email or web address]

## 2019-11-08 ENCOUNTER — Other Ambulatory Visit: Payer: Self-pay

## 2019-11-08 ENCOUNTER — Ambulatory Visit (HOSPITAL_COMMUNITY)
Admission: RE | Admit: 2019-11-08 | Discharge: 2019-11-08 | Disposition: A | Discharge status: Home / Self Care | Payer: Medicare Other | Source: Ambulatory Visit | Attending: Nephrology | Admitting: Nephrology

## 2019-11-08 VITALS — BP 143/72 | HR 73 | Temp 95.4°F | Resp 20

## 2019-11-08 DIAGNOSIS — E1122 Type 2 diabetes mellitus with diabetic chronic kidney disease: Secondary | ICD-10-CM | POA: Diagnosis not present

## 2019-11-08 DIAGNOSIS — D631 Anemia in chronic kidney disease: Secondary | ICD-10-CM | POA: Diagnosis not present

## 2019-11-08 DIAGNOSIS — N184 Chronic kidney disease, stage 4 (severe): Secondary | ICD-10-CM | POA: Diagnosis not present

## 2019-11-08 DIAGNOSIS — I129 Hypertensive chronic kidney disease with stage 1 through stage 4 chronic kidney disease, or unspecified chronic kidney disease: Secondary | ICD-10-CM | POA: Diagnosis not present

## 2019-11-08 DIAGNOSIS — N2581 Secondary hyperparathyroidism of renal origin: Secondary | ICD-10-CM | POA: Diagnosis not present

## 2019-11-08 LAB — POCT HEMOGLOBIN-HEMACUE: Hemoglobin: 10.8 g/dL — ABNORMAL LOW (ref 12.0–15.0)

## 2019-11-08 MED ORDER — EPOETIN ALFA 10000 UNIT/ML IJ SOLN
INTRAMUSCULAR | Status: AC
  Administered 2019-11-08: 10000 [IU] via SUBCUTANEOUS
  Filled 2019-11-08: qty 1

## 2019-11-08 MED ORDER — EPOETIN ALFA 10000 UNIT/ML IJ SOLN: 10000.0000 [IU] | INTRAMUSCULAR | Status: DC

## 2019-11-09 ENCOUNTER — Ambulatory Visit: Payer: Medicare Other | Admitting: Physician Assistant

## 2019-11-09 DIAGNOSIS — M545 Low back pain: Secondary | ICD-10-CM | POA: Diagnosis not present

## 2019-11-09 DIAGNOSIS — M79604 Pain in right leg: Secondary | ICD-10-CM | POA: Diagnosis not present

## 2019-11-09 DIAGNOSIS — M5416 Radiculopathy, lumbar region: Secondary | ICD-10-CM | POA: Diagnosis not present

## 2019-11-22 ENCOUNTER — Other Ambulatory Visit: Payer: Self-pay

## 2019-11-22 ENCOUNTER — Ambulatory Visit (HOSPITAL_COMMUNITY)
Admission: RE | Admit: 2019-11-22 | Discharge: 2019-11-22 | Disposition: A | Discharge status: Home / Self Care | Payer: Medicare Other | Source: Ambulatory Visit | Attending: Nephrology | Admitting: Nephrology

## 2019-11-22 VITALS — BP 144/79 | HR 88 | Temp 95.1°F | Resp 20

## 2019-11-22 DIAGNOSIS — E1122 Type 2 diabetes mellitus with diabetic chronic kidney disease: Secondary | ICD-10-CM | POA: Insufficient documentation

## 2019-11-22 DIAGNOSIS — N184 Chronic kidney disease, stage 4 (severe): Secondary | ICD-10-CM | POA: Diagnosis not present

## 2019-11-22 LAB — IRON AND TIBC
Iron: 64 ug/dL (ref 28–170)
Saturation Ratios: 24 % (ref 10.4–31.8)
TIBC: 272 ug/dL (ref 250–450)
UIBC: 208 ug/dL

## 2019-11-22 LAB — FERRITIN: Ferritin: 88 ng/mL (ref 11–307)

## 2019-11-22 LAB — POCT HEMOGLOBIN-HEMACUE: Hemoglobin: 10.8 g/dL — ABNORMAL LOW (ref 12.0–15.0)

## 2019-11-22 MED ORDER — EPOETIN ALFA 10000 UNIT/ML IJ SOLN
INTRAMUSCULAR | Status: AC
  Filled 2019-11-22: qty 1

## 2019-11-22 MED ORDER — EPOETIN ALFA 10000 UNIT/ML IJ SOLN
10000.0000 [IU] | INTRAMUSCULAR | Status: DC
  Administered 2019-11-22: 10000 [IU] via SUBCUTANEOUS

## 2019-11-23 ENCOUNTER — Telehealth: Payer: Medicare Other

## 2019-11-23 DIAGNOSIS — M545 Low back pain: Secondary | ICD-10-CM | POA: Diagnosis not present

## 2019-11-23 DIAGNOSIS — M79604 Pain in right leg: Secondary | ICD-10-CM | POA: Diagnosis not present

## 2019-11-23 DIAGNOSIS — M5416 Radiculopathy, lumbar region: Secondary | ICD-10-CM | POA: Diagnosis not present

## 2019-11-28 ENCOUNTER — Other Ambulatory Visit: Payer: Self-pay

## 2019-11-28 ENCOUNTER — Ambulatory Visit: Payer: Medicare Other | Admitting: Podiatry

## 2019-11-28 DIAGNOSIS — B351 Tinea unguium: Secondary | ICD-10-CM | POA: Diagnosis not present

## 2019-11-28 DIAGNOSIS — M79674 Pain in right toe(s): Secondary | ICD-10-CM | POA: Diagnosis not present

## 2019-11-28 DIAGNOSIS — N184 Chronic kidney disease, stage 4 (severe): Secondary | ICD-10-CM

## 2019-11-28 DIAGNOSIS — M79675 Pain in left toe(s): Secondary | ICD-10-CM | POA: Diagnosis not present

## 2019-11-28 DIAGNOSIS — Q828 Other specified congenital malformations of skin: Secondary | ICD-10-CM

## 2019-11-28 DIAGNOSIS — E1122 Type 2 diabetes mellitus with diabetic chronic kidney disease: Secondary | ICD-10-CM | POA: Diagnosis not present

## 2019-11-28 NOTE — Patient Instructions (Signed)
Diabetes Mellitus and Foot Care Foot care is an important part of your health, especially when you have diabetes. Diabetes may cause you to have problems because of poor blood flow (circulation) to your feet and legs, which can cause your skin to:  Become thinner and drier.  Break more easily.  Heal more slowly.  Peel and crack. You may also have nerve damage (neuropathy) in your legs and feet, causing decreased feeling in them. This means that you may not notice minor injuries to your feet that could lead to more serious problems. Noticing and addressing any potential problems early is the best way to prevent future foot problems. How to care for your feet Foot hygiene  Wash your feet daily with warm water and mild soap. Do not use hot water. Then, pat your feet and the areas between your toes until they are completely dry. Do not soak your feet as this can dry your skin.  Trim your toenails straight across. Do not dig under them or around the cuticle. File the edges of your nails with an emery board or nail file.  Apply a moisturizing lotion or petroleum jelly to the skin on your feet and to dry, brittle toenails. Use lotion that does not contain alcohol and is unscented. Do not apply lotion between your toes. Shoes and socks  Wear clean socks or stockings every day. Make sure they are not too tight. Do not wear knee-high stockings since they may decrease blood flow to your legs.  Wear shoes that fit properly and have enough cushioning. Always look in your shoes before you put them on to be sure there are no objects inside.  To break in new shoes, wear them for just a few hours a day. This prevents injuries on your feet. Wounds, scrapes, corns, and calluses  Check your feet daily for blisters, cuts, bruises, sores, and redness. If you cannot see the bottom of your feet, use a mirror or ask someone for help.  Do not cut corns or calluses or try to remove them with medicine.  If you  find a minor scrape, cut, or break in the skin on your feet, keep it and the skin around it clean and dry. You may clean these areas with mild soap and water. Do not clean the area with peroxide, alcohol, or iodine.  If you have a wound, scrape, corn, or callus on your foot, look at it several times a day to make sure it is healing and not infected. Check for: ? Redness, swelling, or pain. ? Fluid or blood. ? Warmth. ? Pus or a bad smell. General instructions  Do not cross your legs. This may decrease blood flow to your feet.  Do not use heating pads or hot water bottles on your feet. They may burn your skin. If you have lost feeling in your feet or legs, you may not know this is happening until it is too late.  Protect your feet from hot and cold by wearing shoes, such as at the beach or on hot pavement.  Schedule a complete foot exam at least once a year (annually) or more often if you have foot problems. If you have foot problems, report any cuts, sores, or bruises to your health care provider immediately. Contact a health care provider if:  You have a medical condition that increases your risk of infection and you have any cuts, sores, or bruises on your feet.  You have an injury that is not   healing.  You have redness on your legs or feet.  You feel burning or tingling in your legs or feet.  You have pain or cramps in your legs and feet.  Your legs or feet are numb.  Your feet always feel cold.  You have pain around a toenail. Get help right away if:  You have a wound, scrape, corn, or callus on your foot and: ? You have pain, swelling, or redness that gets worse. ? You have fluid or blood coming from the wound, scrape, corn, or callus. ? Your wound, scrape, corn, or callus feels warm to the touch. ? You have pus or a bad smell coming from the wound, scrape, corn, or callus. ? You have a fever. ? You have a red line going up your leg. Summary  Check your feet every day  for cuts, sores, red spots, swelling, and blisters.  Moisturize feet and legs daily.  Wear shoes that fit properly and have enough cushioning.  If you have foot problems, report any cuts, sores, or bruises to your health care provider immediately.  Schedule a complete foot exam at least once a year (annually) or more often if you have foot problems. This information is not intended to replace advice given to you by your health care provider. Make sure you discuss any questions you have with your health care provider. Document Revised: 03/16/2019 Document Reviewed: 07/25/2016 Elsevier Patient Education  2020 [employer]  

## 2019-12-03 ENCOUNTER — Encounter: Payer: Self-pay | Admitting: Podiatry

## 2019-12-03 NOTE — Progress Notes (Signed)
Subjective: Miranda Roman presents today at risk foot care. Pt has h/o NIDDM with chronic kidney disease and painful callus(es) b/l feet and painful mycotic toenails b/l that are difficult to trim. Pain interferes with ambulation. Aggravating factors include wearing enclosed shoe gear. Pain is relieved with periodic professional debridement.   Last A1c was 6.1% on 09/28/2019.  Glendale Chard, MD is patient's PCP. Last visit was: 09/28/2019.  Past Medical History:  Diagnosis Date  . Anemia   . Diabetes mellitus   . Hyperlipidemia   . Hypertension   . Osteoporosis   . Sickle cell anemia (HCC)      Current Outpatient Medications on File Prior to Visit  Medication Sig Dispense Refill  . acetaminophen (TYLENOL) 500 MG tablet Take 500 mg by mouth every 6 (six) hours as needed for moderate pain.    [name] amLODipine (NORVASC) 2.5 MG tablet Take 2.5 mg by mouth daily.    [name] atorvastatin (LIPITOR) 40 MG tablet Take 40 mg by mouth daily.    . calcitRIOL (ROCALTROL) 0.25 MCG capsule Take 0.5-1 mcg by mouth daily. 3 tablets daily    . DEXILANT 60 MG capsule TAKE 1 CAPSULE BY MOUTH  DAILY 30 capsule 11  . epoetin alfa (EPOGEN,PROCRIT) 16073 UNIT/ML injection 10 Units once. Takes every 2 weeks    . glucose blood (ONETOUCH VERIO) test strip Use as instructed to check blood sugars 1 time per day e11.22 100 each 11  . insulin lispro (HUMALOG) 100 UNIT/ML injection Inject 0.04 mLs (4 Units total) into the skin 3 (three) times daily before meals. Sliding scale (Patient taking differently: Inject 4 Units into the skin 3 (three) times daily before meals. ) 10 mL 2  . LEVEMIR FLEXTOUCH 100 UNIT/ML Pen INJECT SUBCUTANEOUSLY 16  UNITS DAILY 15 mL 3  . loratadine (CLARITIN) 10 MG tablet Take 1 tablet (10 mg total) by mouth daily. 30 tablet 2  . ONETOUCH DELICA LANCETS 71G MISC 1 each by Does not apply route daily. DX: E11.65 150 each 11   No current facility-administered medications on file prior to visit.      No Known Allergies  Objective: Miranda Roman is a pleasant 74 y.o. y.o. Patient Race: Black or African American [2]  female in NAD. AAO x 3.  There were no vitals filed for this visit.  Vascular Examination: Neurovascular status unchanged b/l. Capillary refill time to digits immediate b/l. Palpable DP pulses b/l. Palpable PT pulses b/l. Pedal hair absent b/l Skin temperature gradient within normal limits b/l.  Dermatological Examination: Pedal skin with normal turgor, texture and tone bilaterally. No open wounds bilaterally. No interdigital macerations bilaterally. Toenails 1-5 b/l elongated, discolored, dystrophic, thickened, crumbly with subungual debris and tenderness to dorsal palpation. Hyperkeratotic lesion(s) L 3rd toe, submet head 3 left foot, submet head 5 left foot and submet head 5 right foot.  No erythema, no edema, no drainage, no flocculence.   Flesh colored elevated lesion noted dorsum of left foot, stable and unchanged. No symptoms such as pain, bleeding. No asymmetry, borders normal, color normal, no underlying nodularity.  Musculoskeletal: Normal muscle strength 5/5 to all lower extremity muscle groups bilaterally. No pain crepitus or joint limitation noted with ROM b/l. Tailor's bunion deformity noted b/l.  Neurological Examination: Protective sensation intact 5/5 intact bilaterally with 10g monofilament b/l. Proprioception intact bilaterally.  Assessment: 1. Pain due to onychomycosis of toenails of both feet   2. Porokeratosis   3. Diabetes mellitus with stage 4 chronic kidney disease (  [employer])   Plan: -Examined patient. -No new findings. No new orders. -Continue diabetic foot care principles. Literature dispensed on today.  -Toenails 1-5 b/l were debrided in length and girth with sterile nail nippers and dremel without iatrogenic bleeding.  -Callus(es) submet head 3 left foot, submet head 5 left foot and submet head 5 right foot pared utilizing sterile scalpel  blade without complication or incident. Total number debrided =3. -Patient to continue soft, supportive shoe gear daily. -Patient to report any pedal injuries to medical professional immediately. -Patient/POA to call should there be question/concern in the interim.  Return in about 9 weeks (around 01/30/2020) for diabetic nail and callus trim.  Marzetta Board, DPM

## 2019-12-06 ENCOUNTER — Other Ambulatory Visit: Payer: Self-pay

## 2019-12-06 ENCOUNTER — Encounter (HOSPITAL_COMMUNITY)
Admission: RE | Admit: 2019-12-06 | Discharge: 2019-12-06 | Disposition: A | Discharge status: Home / Self Care | Payer: Medicare Other | Source: Ambulatory Visit | Attending: Nephrology | Admitting: Nephrology

## 2019-12-06 VITALS — BP 153/83 | HR 87 | Temp 95.4°F | Resp 20

## 2019-12-06 DIAGNOSIS — N184 Chronic kidney disease, stage 4 (severe): Secondary | ICD-10-CM | POA: Insufficient documentation

## 2019-12-06 DIAGNOSIS — E1122 Type 2 diabetes mellitus with diabetic chronic kidney disease: Secondary | ICD-10-CM | POA: Diagnosis not present

## 2019-12-06 LAB — POCT HEMOGLOBIN-HEMACUE: Hemoglobin: 11.1 g/dL — ABNORMAL LOW (ref 12.0–15.0)

## 2019-12-06 MED ORDER — EPOETIN ALFA 10000 UNIT/ML IJ SOLN
INTRAMUSCULAR | Status: AC
  Filled 2019-12-06: qty 1

## 2019-12-06 MED ORDER — EPOETIN ALFA 10000 UNIT/ML IJ SOLN
10000.0000 [IU] | INTRAMUSCULAR | Status: DC
  Administered 2019-12-06: 10000 [IU] via SUBCUTANEOUS

## 2019-12-20 ENCOUNTER — Encounter (HOSPITAL_COMMUNITY): Payer: Medicare Other

## 2019-12-20 ENCOUNTER — Other Ambulatory Visit: Payer: Self-pay

## 2019-12-20 DIAGNOSIS — R6510 Systemic inflammatory response syndrome (SIRS) of non-infectious origin without acute organ dysfunction: Secondary | ICD-10-CM | POA: Diagnosis not present

## 2019-12-20 DIAGNOSIS — I368 Other nonrheumatic tricuspid valve disorders: Secondary | ICD-10-CM | POA: Diagnosis not present

## 2019-12-20 DIAGNOSIS — R000 Tachycardia, unspecified: Secondary | ICD-10-CM | POA: Diagnosis not present

## 2019-12-20 DIAGNOSIS — Z7189 Other specified counseling: Secondary | ICD-10-CM | POA: Diagnosis not present

## 2019-12-20 DIAGNOSIS — I10 Essential (primary) hypertension: Secondary | ICD-10-CM | POA: Diagnosis not present

## 2019-12-20 DIAGNOSIS — E6609 Other obesity due to excess calories: Secondary | ICD-10-CM | POA: Diagnosis not present

## 2019-12-20 DIAGNOSIS — R079 Chest pain, unspecified: Secondary | ICD-10-CM | POA: Diagnosis not present

## 2019-12-20 DIAGNOSIS — I129 Hypertensive chronic kidney disease with stage 1 through stage 4 chronic kidney disease, or unspecified chronic kidney disease: Secondary | ICD-10-CM | POA: Diagnosis not present

## 2019-12-20 DIAGNOSIS — M25562 Pain in left knee: Secondary | ICD-10-CM | POA: Diagnosis not present

## 2019-12-20 DIAGNOSIS — R7989 Other specified abnormal findings of blood chemistry: Secondary | ICD-10-CM

## 2019-12-20 DIAGNOSIS — G039 Meningitis, unspecified: Secondary | ICD-10-CM | POA: Diagnosis not present

## 2019-12-20 DIAGNOSIS — R9082 White matter disease, unspecified: Secondary | ICD-10-CM | POA: Diagnosis not present

## 2019-12-20 DIAGNOSIS — I120 Hypertensive chronic kidney disease with stage 5 chronic kidney disease or end stage renal disease: Secondary | ICD-10-CM | POA: Diagnosis not present

## 2019-12-20 DIAGNOSIS — R410 Disorientation, unspecified: Secondary | ICD-10-CM | POA: Diagnosis not present

## 2019-12-20 DIAGNOSIS — D593 Hemolytic-uremic syndrome: Secondary | ICD-10-CM | POA: Diagnosis not present

## 2019-12-20 DIAGNOSIS — D72829 Elevated white blood cell count, unspecified: Secondary | ICD-10-CM | POA: Diagnosis not present

## 2019-12-20 DIAGNOSIS — J9601 Acute respiratory failure with hypoxia: Secondary | ICD-10-CM | POA: Diagnosis not present

## 2019-12-20 DIAGNOSIS — Z79899 Other long term (current) drug therapy: Secondary | ICD-10-CM | POA: Diagnosis not present

## 2019-12-20 DIAGNOSIS — E872 Acidosis: Secondary | ICD-10-CM | POA: Diagnosis not present

## 2019-12-20 DIAGNOSIS — D759 Disease of blood and blood-forming organs, unspecified: Secondary | ICD-10-CM | POA: Diagnosis not present

## 2019-12-20 DIAGNOSIS — A879 Viral meningitis, unspecified: Secondary | ICD-10-CM | POA: Diagnosis not present

## 2019-12-20 DIAGNOSIS — I351 Nonrheumatic aortic (valve) insufficiency: Secondary | ICD-10-CM | POA: Diagnosis not present

## 2019-12-20 DIAGNOSIS — T361X5A Adverse effect of cephalosporins and other beta-lactam antibiotics, initial encounter: Secondary | ICD-10-CM | POA: Diagnosis not present

## 2019-12-20 DIAGNOSIS — E1165 Type 2 diabetes mellitus with hyperglycemia: Secondary | ICD-10-CM | POA: Diagnosis not present

## 2019-12-20 DIAGNOSIS — D509 Iron deficiency anemia, unspecified: Secondary | ICD-10-CM | POA: Diagnosis not present

## 2019-12-20 DIAGNOSIS — R1319 Other dysphagia: Secondary | ICD-10-CM | POA: Diagnosis not present

## 2019-12-20 DIAGNOSIS — K5730 Diverticulosis of large intestine without perforation or abscess without bleeding: Secondary | ICD-10-CM | POA: Diagnosis not present

## 2019-12-20 DIAGNOSIS — N184 Chronic kidney disease, stage 4 (severe): Secondary | ICD-10-CM | POA: Diagnosis not present

## 2019-12-20 DIAGNOSIS — R0989 Other specified symptoms and signs involving the circulatory and respiratory systems: Secondary | ICD-10-CM | POA: Diagnosis not present

## 2019-12-20 DIAGNOSIS — I272 Pulmonary hypertension, unspecified: Secondary | ICD-10-CM | POA: Diagnosis not present

## 2019-12-20 DIAGNOSIS — K8020 Calculus of gallbladder without cholecystitis without obstruction: Secondary | ICD-10-CM | POA: Diagnosis not present

## 2019-12-20 DIAGNOSIS — Z9641 Presence of insulin pump (external) (internal): Secondary | ICD-10-CM | POA: Diagnosis not present

## 2019-12-20 DIAGNOSIS — R7401 Elevation of levels of liver transaminase levels: Secondary | ICD-10-CM | POA: Diagnosis not present

## 2019-12-20 DIAGNOSIS — D638 Anemia in other chronic diseases classified elsewhere: Secondary | ICD-10-CM | POA: Diagnosis not present

## 2019-12-20 DIAGNOSIS — Z452 Encounter for adjustment and management of vascular access device: Secondary | ICD-10-CM | POA: Diagnosis not present

## 2019-12-20 DIAGNOSIS — I517 Cardiomegaly: Secondary | ICD-10-CM | POA: Diagnosis not present

## 2019-12-20 DIAGNOSIS — G9341 Metabolic encephalopathy: Secondary | ICD-10-CM | POA: Diagnosis not present

## 2019-12-20 DIAGNOSIS — R569 Unspecified convulsions: Secondary | ICD-10-CM | POA: Diagnosis not present

## 2019-12-20 DIAGNOSIS — R918 Other nonspecific abnormal finding of lung field: Secondary | ICD-10-CM | POA: Diagnosis not present

## 2019-12-20 DIAGNOSIS — E512 Wernicke's encephalopathy: Secondary | ICD-10-CM | POA: Diagnosis not present

## 2019-12-20 DIAGNOSIS — Z8739 Personal history of other diseases of the musculoskeletal system and connective tissue: Secondary | ICD-10-CM | POA: Diagnosis not present

## 2019-12-20 DIAGNOSIS — Z794 Long term (current) use of insulin: Secondary | ICD-10-CM | POA: Diagnosis not present

## 2019-12-20 DIAGNOSIS — E871 Hypo-osmolality and hyponatremia: Secondary | ICD-10-CM | POA: Diagnosis not present

## 2019-12-20 DIAGNOSIS — Z5189 Encounter for other specified aftercare: Secondary | ICD-10-CM | POA: Diagnosis not present

## 2019-12-20 DIAGNOSIS — R509 Fever, unspecified: Secondary | ICD-10-CM | POA: Diagnosis not present

## 2019-12-20 DIAGNOSIS — J90 Pleural effusion, not elsewhere classified: Secondary | ICD-10-CM | POA: Diagnosis not present

## 2019-12-20 DIAGNOSIS — M810 Age-related osteoporosis without current pathological fracture: Secondary | ICD-10-CM | POA: Diagnosis not present

## 2019-12-20 DIAGNOSIS — N185 Chronic kidney disease, stage 5: Secondary | ICD-10-CM | POA: Diagnosis not present

## 2019-12-20 DIAGNOSIS — I739 Peripheral vascular disease, unspecified: Secondary | ICD-10-CM | POA: Diagnosis not present

## 2019-12-20 DIAGNOSIS — E440 Moderate protein-calorie malnutrition: Secondary | ICD-10-CM | POA: Diagnosis not present

## 2019-12-20 DIAGNOSIS — G9340 Encephalopathy, unspecified: Secondary | ICD-10-CM | POA: Diagnosis not present

## 2019-12-20 DIAGNOSIS — N289 Disorder of kidney and ureter, unspecified: Secondary | ICD-10-CM | POA: Diagnosis not present

## 2019-12-20 DIAGNOSIS — E870 Hyperosmolality and hypernatremia: Secondary | ICD-10-CM | POA: Diagnosis not present

## 2019-12-20 DIAGNOSIS — R0602 Shortness of breath: Secondary | ICD-10-CM | POA: Diagnosis not present

## 2019-12-20 DIAGNOSIS — N179 Acute kidney failure, unspecified: Secondary | ICD-10-CM | POA: Diagnosis not present

## 2019-12-20 DIAGNOSIS — M311 Thrombotic microangiopathy: Secondary | ICD-10-CM | POA: Diagnosis not present

## 2019-12-20 DIAGNOSIS — Z4682 Encounter for fitting and adjustment of non-vascular catheter: Secondary | ICD-10-CM | POA: Diagnosis not present

## 2019-12-20 DIAGNOSIS — R29818 Other symptoms and signs involving the nervous system: Secondary | ICD-10-CM | POA: Diagnosis not present

## 2019-12-20 DIAGNOSIS — G92 Toxic encephalopathy: Secondary | ICD-10-CM | POA: Diagnosis not present

## 2019-12-20 DIAGNOSIS — R195 Other fecal abnormalities: Secondary | ICD-10-CM | POA: Diagnosis not present

## 2019-12-20 DIAGNOSIS — Z87891 Personal history of nicotine dependence: Secondary | ICD-10-CM | POA: Diagnosis not present

## 2019-12-20 DIAGNOSIS — E119 Type 2 diabetes mellitus without complications: Secondary | ICD-10-CM | POA: Diagnosis not present

## 2019-12-20 DIAGNOSIS — K921 Melena: Secondary | ICD-10-CM | POA: Diagnosis not present

## 2019-12-20 DIAGNOSIS — Z6831 Body mass index (BMI) 31.0-31.9, adult: Secondary | ICD-10-CM | POA: Diagnosis not present

## 2019-12-20 DIAGNOSIS — J811 Chronic pulmonary edema: Secondary | ICD-10-CM | POA: Diagnosis not present

## 2019-12-20 DIAGNOSIS — R638 Other symptoms and signs concerning food and fluid intake: Secondary | ICD-10-CM | POA: Diagnosis not present

## 2019-12-20 DIAGNOSIS — R0603 Acute respiratory distress: Secondary | ICD-10-CM | POA: Diagnosis not present

## 2019-12-20 DIAGNOSIS — R0789 Other chest pain: Secondary | ICD-10-CM | POA: Diagnosis not present

## 2019-12-20 DIAGNOSIS — D649 Anemia, unspecified: Secondary | ICD-10-CM | POA: Diagnosis not present

## 2019-12-20 DIAGNOSIS — E559 Vitamin D deficiency, unspecified: Secondary | ICD-10-CM | POA: Diagnosis not present

## 2019-12-20 DIAGNOSIS — E785 Hyperlipidemia, unspecified: Secondary | ICD-10-CM | POA: Diagnosis not present

## 2019-12-20 DIAGNOSIS — Z515 Encounter for palliative care: Secondary | ICD-10-CM | POA: Diagnosis not present

## 2019-12-20 DIAGNOSIS — Z20822 Contact with and (suspected) exposure to covid-19: Secondary | ICD-10-CM | POA: Diagnosis not present

## 2019-12-20 DIAGNOSIS — R9401 Abnormal electroencephalogram [EEG]: Secondary | ICD-10-CM | POA: Diagnosis not present

## 2019-12-20 DIAGNOSIS — E1122 Type 2 diabetes mellitus with diabetic chronic kidney disease: Secondary | ICD-10-CM | POA: Diagnosis not present

## 2019-12-20 DIAGNOSIS — Z9884 Bariatric surgery status: Secondary | ICD-10-CM | POA: Diagnosis not present

## 2019-12-20 DIAGNOSIS — L89891 Pressure ulcer of other site, stage 1: Secondary | ICD-10-CM | POA: Diagnosis not present

## 2019-12-20 DIAGNOSIS — R9089 Other abnormal findings on diagnostic imaging of central nervous system: Secondary | ICD-10-CM | POA: Diagnosis not present

## 2019-12-20 DIAGNOSIS — R1013 Epigastric pain: Secondary | ICD-10-CM | POA: Diagnosis not present

## 2019-12-20 DIAGNOSIS — R1310 Dysphagia, unspecified: Secondary | ICD-10-CM | POA: Diagnosis not present

## 2019-12-20 DIAGNOSIS — Z743 Need for continuous supervision: Secondary | ICD-10-CM | POA: Diagnosis not present

## 2019-12-20 DIAGNOSIS — D696 Thrombocytopenia, unspecified: Secondary | ICD-10-CM | POA: Diagnosis not present

## 2019-12-20 DIAGNOSIS — Z7984 Long term (current) use of oral hypoglycemic drugs: Secondary | ICD-10-CM | POA: Diagnosis not present

## 2019-12-20 DIAGNOSIS — I509 Heart failure, unspecified: Secondary | ICD-10-CM | POA: Diagnosis not present

## 2019-12-20 DIAGNOSIS — E860 Dehydration: Secondary | ICD-10-CM | POA: Diagnosis not present

## 2019-12-20 DIAGNOSIS — R4182 Altered mental status, unspecified: Secondary | ICD-10-CM | POA: Diagnosis not present

## 2019-12-20 HISTORY — DX: Other specified abnormal findings of blood chemistry: R79.89

## 2019-12-20 MED ORDER — ATORVASTATIN CALCIUM 40 MG PO TABS: 40.0000 mg | ORAL_TABLET | Freq: Every day | ORAL | 1 refills | Status: DC

## 2019-12-22 DIAGNOSIS — R509 Fever, unspecified: Secondary | ICD-10-CM | POA: Insufficient documentation

## 2019-12-22 DIAGNOSIS — D72829 Elevated white blood cell count, unspecified: Secondary | ICD-10-CM

## 2019-12-22 HISTORY — DX: Elevated white blood cell count, unspecified: D72.829

## 2019-12-23 DIAGNOSIS — M311 Thrombotic microangiopathy: Secondary | ICD-10-CM | POA: Diagnosis not present

## 2020-01-02 ENCOUNTER — Other Ambulatory Visit: Payer: Self-pay | Admitting: Internal Medicine

## 2020-01-02 DIAGNOSIS — N184 Chronic kidney disease, stage 4 (severe): Secondary | ICD-10-CM

## 2020-01-02 DIAGNOSIS — I129 Hypertensive chronic kidney disease with stage 1 through stage 4 chronic kidney disease, or unspecified chronic kidney disease: Secondary | ICD-10-CM

## 2020-01-02 DIAGNOSIS — E1122 Type 2 diabetes mellitus with diabetic chronic kidney disease: Secondary | ICD-10-CM

## 2020-01-03 ENCOUNTER — Telehealth: Payer: Self-pay

## 2020-01-03 ENCOUNTER — Ambulatory Visit: Payer: Medicare Other

## 2020-01-03 NOTE — Telephone Encounter (Signed)
The pt's daughter [name] called about getting the pt back to [place] from [facility] because they are trying to get the pt admitted to [place] if the [facility]'t [facility] [place].  I returned [name]'s call and told her that Dr Baird Cancer was having the social worker Tillie Rung to work on getting the pt back to [facility] and that Tillie Rung seen that the pt's family wanted to keep her in [place].  [name] said no that's incorrect and that they would like Kendra's help. I called and left a message for Tillie Rung to contact [place].

## 2020-01-03 NOTE — Chronic Care Management (AMB) (Signed)
  Care Management   Outreach Note  01/03/2020 Name: Miranda Roman MRN: [id number] DOB: [birth year]  Referred by: Glendale Chard, MD Reason for referral : Care Coordination  SW collaborated with patient primary care provider regarding urgent referral placed to SW requesting assistance with coordinating patient transfer from inpatient stay at [facility] to a local [place].   SW performed chart review and collaborated with [place] and assistance clinical director, Bary Castilla, regarding patient care coordination needs. SW gathered resources including a list of four flight resources as well as 4 local long term acute care hospitals.   SW placed an unsuccessful outbound call to the care management team of [place] left a detailed voice message requesting a return call.  SW reviewed chart to note updated inpatient care management notes stating the following:   Elicia Lamp, RN - 01/03/2020 11:28 AM EDT Formatting of this note is different from the original.  01/03/20 1128  Plan Information  Plan: D/C Planning Daily Note   RN CM was requested at the bedside by the family. LTACH referral to [facility] was requested by the family. Choice form obtained and referral built and sent. RN CM called and spoke to [facility] at [phone] and notified of referral. RN CM notified Dr Alfonse Spruce and he is agreeable to [facility] referral. CM to continue to follow and collaborate as needed for DCP.    Electronically signed by Elicia Lamp, RN at 01/03/2020 11:31 AM EDT   Back to top of Progress Notes Elicia Lamp, RN - 01/03/2020 10:29 AM EDT Formatting of this note is different from the original.  01/03/20 1029  Plan Information  Plan: D/C Planning Daily Note   RN CM collaborating with family and attending for DCP. PTACH recommended by attending. Family considering tranfer to [facility]. RN CM provided resources to  the family. RN CM to continue to follow and collaborate as needed for safe DCP.    Electronically signed by Elicia Lamp, RN at 01/03/2020 10:30 AM EDT    Follow Up Plan: SW collaborated with RN Care Manager to discuss current inpatient plan. Given the patient family has been provided resources and already selected an LTACH, SW will sign off and close referral. The chronic care management team is available to work with patient in the future upon her return to [place]. Collaboration with Dr. Baird Cancer regarding plan.  Daneen Schick, BSW, CDP Social Worker, Certified Dementia Practitioner [place] / [place] [facility] [phone]

## 2020-01-04 ENCOUNTER — Ambulatory Visit: Payer: Self-pay

## 2020-01-04 NOTE — Chronic Care Management (AMB) (Signed)
  Care Management   Outreach Note  01/04/2020 Name: Miranda Roman MRN: [id number] DOB: [birth year]  Referred by: Glendale Chard, MD Reason for referral : [place] with Dr. Baird Cancer to provide an update on SW inability to establish contact with patients daughter as well as SW plan to provide resources once a return call is received. SW performed chart review to note following update from inpatient case manager:  01/04/20 [place]: D/C Planning Daily Note   RN CM spoke to [facility] [employer] at [employer] Updated the referral as requested. Stanton Kidney has been in touch with [facility] [place] and is collaborating for patient and family for transportation. RN CM called and spoke to Dr Alfonse Spruce and he is aware. RN CM will continue to follow and collaborate as needed for safe discharge.   Electronically signed by Elicia Lamp, RN at 01/04/2020 12:05 PM EDT    Follow Up Plan: SW shared updated plan with Dr. Baird Cancer in order to communicate discharge plan for the patient.  The embedded care management team is available to assist with future chronic care management needs upon [facility] discharge should the patients PCP feel appropriate.   Daneen Schick, BSW, CDP Social Worker, Certified Dementia Practitioner [place] / [place] Management [phone]

## 2020-01-04 NOTE — Chronic Care Management (AMB) (Signed)
  Care Management   Outreach Note  01/04/2020 Name: DEVINE DANT MRN: [id number] DOB: Aug 01, 1945  Referred by: Glendale Chard, MD Reason for referral : Care Coordination   SW received from communication from patient primary care provider, Dr. Baird Cancer, requesting outreach to patient daughter to discuss resources involved in transitioning from [place] to [place]. SW placed unsuccessful outbound call to Tedra Coupe [phone]). SW left HIPAA compliant voice message requesting a return call.   Daneen Schick, BSW, CDP Social Worker, Certified Dementia Practitioner [place] / [place] [facility] [phone]

## 2020-01-06 ENCOUNTER — Ambulatory Visit: Payer: Self-pay

## 2020-01-06 NOTE — Chronic Care Management (AMB) (Signed)
  Care Management   Outreach Note  01/06/2020 Name: Miranda Roman MRN: [id number] DOB: March 25, 1946  Referred by: Glendale Chard, MD Reason for referral : [place] with Dr. Baird Cancer regarding patient family interest in transferring patient to a local hospital. Dr. Baird Cancer requested SW assistance in provided family support.  SW received inbound call from the patients children Miranda Roman and Miranda Roman. Discussed desire to transfer patient to either [facility] or [facility] to allow the patient to be closer to home. The family reports concern surrounding length of hospital stay and lack of diagnosis. SW advised the family, SW was unable to assist with a transfer but would be happy to provide resource information. Provided flight resource "[employer]" and advised the family to contact "Event organiser" to determine if the patient qualifies to access this service.  SW collaboration with supervisor, Miranda Shy RN to discuss patient case and family request. Obtained contact numbers to both [facility] and [facility] referral line.   Successful outbound call placed to Thressa Sheller to provide contact numbers. Advised Mr. Cherian to provide this resource to the patients inpatient medical team.   Successful outbound call placed to [facility] care management team. Spoke with patients current case manager, Miranda Roman. Provided Miranda Roman with referral numbers to both [facility] and [facility]. Informed by Miranda Roman she does not feel the patient is stable enough for transport to [place]. She reports the patient is not longer discharging to [facility] as planned due to families request to conduct a "new total work up" prior to discharging the patient. Inpatient care team no longer planning discharge to [facility] at this time. Provided Miranda Roman with direct contact number to SW in the event alternative resources are needed to assist with discharge planning.   Collaboration with Dr. Baird Cancer and embedded [place] to provide update of patients current disposition and plan.  Daneen Schick, BSW, CDP Social Worker, Certified Dementia Practitioner [place] / [place] [facility] [phone]

## 2020-01-13 ENCOUNTER — Inpatient Hospital Stay (HOSPITAL_COMMUNITY)
Admission: AD | Admit: 2020-01-13 | Discharge: 2020-02-15 | DRG: 641 | Disposition: A | Discharge status: Skilled Nursing Facility | Payer: Medicare Other | Source: Other Acute Inpatient Hospital | Attending: Internal Medicine | Admitting: Internal Medicine

## 2020-01-13 ENCOUNTER — Other Ambulatory Visit: Payer: Self-pay

## 2020-01-13 DIAGNOSIS — Z1152 Encounter for screening for COVID-19: Secondary | ICD-10-CM

## 2020-01-13 DIAGNOSIS — M810 Age-related osteoporosis without current pathological fracture: Secondary | ICD-10-CM | POA: Diagnosis not present

## 2020-01-13 DIAGNOSIS — E861 Hypovolemia: Secondary | ICD-10-CM | POA: Diagnosis not present

## 2020-01-13 DIAGNOSIS — Z881 Allergy status to other antibiotic agents status: Secondary | ICD-10-CM

## 2020-01-13 DIAGNOSIS — I10 Essential (primary) hypertension: Secondary | ICD-10-CM | POA: Diagnosis not present

## 2020-01-13 DIAGNOSIS — Z6825 Body mass index (BMI) 25.0-25.9, adult: Secondary | ICD-10-CM | POA: Diagnosis not present

## 2020-01-13 DIAGNOSIS — Z7189 Other specified counseling: Secondary | ICD-10-CM | POA: Diagnosis not present

## 2020-01-13 DIAGNOSIS — D696 Thrombocytopenia, unspecified: Secondary | ICD-10-CM | POA: Diagnosis present

## 2020-01-13 DIAGNOSIS — D573 Sickle-cell trait: Secondary | ICD-10-CM | POA: Diagnosis present

## 2020-01-13 DIAGNOSIS — E46 Unspecified protein-calorie malnutrition: Secondary | ICD-10-CM | POA: Diagnosis not present

## 2020-01-13 DIAGNOSIS — Z794 Long term (current) use of insulin: Secondary | ICD-10-CM | POA: Diagnosis not present

## 2020-01-13 DIAGNOSIS — R569 Unspecified convulsions: Secondary | ICD-10-CM | POA: Diagnosis not present

## 2020-01-13 DIAGNOSIS — N184 Chronic kidney disease, stage 4 (severe): Secondary | ICD-10-CM | POA: Diagnosis present

## 2020-01-13 DIAGNOSIS — R4182 Altered mental status, unspecified: Secondary | ICD-10-CM | POA: Diagnosis not present

## 2020-01-13 DIAGNOSIS — E6609 Other obesity due to excess calories: Secondary | ICD-10-CM

## 2020-01-13 DIAGNOSIS — R9401 Abnormal electroencephalogram [EEG]: Secondary | ICD-10-CM | POA: Diagnosis not present

## 2020-01-13 DIAGNOSIS — L8990 Pressure ulcer of unspecified site, unspecified stage: Secondary | ICD-10-CM | POA: Insufficient documentation

## 2020-01-13 DIAGNOSIS — R7401 Elevation of levels of liver transaminase levels: Secondary | ICD-10-CM | POA: Diagnosis not present

## 2020-01-13 DIAGNOSIS — M6281 Muscle weakness (generalized): Secondary | ICD-10-CM | POA: Diagnosis not present

## 2020-01-13 DIAGNOSIS — I120 Hypertensive chronic kidney disease with stage 5 chronic kidney disease or end stage renal disease: Secondary | ICD-10-CM | POA: Diagnosis not present

## 2020-01-13 DIAGNOSIS — M109 Gout, unspecified: Secondary | ICD-10-CM | POA: Diagnosis present

## 2020-01-13 DIAGNOSIS — M4808 Spinal stenosis, sacral and sacrococcygeal region: Secondary | ICD-10-CM | POA: Diagnosis not present

## 2020-01-13 DIAGNOSIS — R130 Aphagia: Secondary | ICD-10-CM | POA: Diagnosis not present

## 2020-01-13 DIAGNOSIS — Z9884 Bariatric surgery status: Secondary | ICD-10-CM | POA: Diagnosis not present

## 2020-01-13 DIAGNOSIS — R1319 Other dysphagia: Secondary | ICD-10-CM | POA: Diagnosis not present

## 2020-01-13 DIAGNOSIS — E785 Hyperlipidemia, unspecified: Secondary | ICD-10-CM | POA: Diagnosis present

## 2020-01-13 DIAGNOSIS — K862 Cyst of pancreas: Secondary | ICD-10-CM | POA: Diagnosis not present

## 2020-01-13 DIAGNOSIS — N281 Cyst of kidney, acquired: Secondary | ICD-10-CM | POA: Diagnosis not present

## 2020-01-13 DIAGNOSIS — G9340 Encephalopathy, unspecified: Secondary | ICD-10-CM | POA: Diagnosis not present

## 2020-01-13 DIAGNOSIS — Z7401 Bed confinement status: Secondary | ICD-10-CM | POA: Diagnosis not present

## 2020-01-13 DIAGNOSIS — K8020 Calculus of gallbladder without cholecystitis without obstruction: Secondary | ICD-10-CM | POA: Diagnosis not present

## 2020-01-13 DIAGNOSIS — Z6831 Body mass index (BMI) 31.0-31.9, adult: Secondary | ICD-10-CM | POA: Diagnosis not present

## 2020-01-13 DIAGNOSIS — Z79899 Other long term (current) drug therapy: Secondary | ICD-10-CM | POA: Diagnosis not present

## 2020-01-13 DIAGNOSIS — E871 Hypo-osmolality and hyponatremia: Secondary | ICD-10-CM | POA: Diagnosis not present

## 2020-01-13 DIAGNOSIS — R531 Weakness: Secondary | ICD-10-CM | POA: Diagnosis not present

## 2020-01-13 DIAGNOSIS — D638 Anemia in other chronic diseases classified elsewhere: Secondary | ICD-10-CM | POA: Diagnosis present

## 2020-01-13 DIAGNOSIS — Z87891 Personal history of nicotine dependence: Secondary | ICD-10-CM

## 2020-01-13 DIAGNOSIS — E870 Hyperosmolality and hypernatremia: Secondary | ICD-10-CM | POA: Diagnosis not present

## 2020-01-13 DIAGNOSIS — R1310 Dysphagia, unspecified: Secondary | ICD-10-CM | POA: Diagnosis not present

## 2020-01-13 DIAGNOSIS — I272 Pulmonary hypertension, unspecified: Secondary | ICD-10-CM | POA: Diagnosis present

## 2020-01-13 DIAGNOSIS — E875 Hyperkalemia: Secondary | ICD-10-CM | POA: Diagnosis not present

## 2020-01-13 DIAGNOSIS — Z4682 Encounter for fitting and adjustment of non-vascular catheter: Secondary | ICD-10-CM | POA: Diagnosis not present

## 2020-01-13 DIAGNOSIS — G9341 Metabolic encephalopathy: Secondary | ICD-10-CM | POA: Diagnosis not present

## 2020-01-13 DIAGNOSIS — E559 Vitamin D deficiency, unspecified: Secondary | ICD-10-CM | POA: Diagnosis present

## 2020-01-13 DIAGNOSIS — Z9641 Presence of insulin pump (external) (internal): Secondary | ICD-10-CM | POA: Diagnosis present

## 2020-01-13 DIAGNOSIS — N185 Chronic kidney disease, stage 5: Secondary | ICD-10-CM | POA: Diagnosis not present

## 2020-01-13 DIAGNOSIS — Z515 Encounter for palliative care: Secondary | ICD-10-CM

## 2020-01-13 DIAGNOSIS — N179 Acute kidney failure, unspecified: Secondary | ICD-10-CM | POA: Diagnosis present

## 2020-01-13 DIAGNOSIS — E119 Type 2 diabetes mellitus without complications: Secondary | ICD-10-CM | POA: Diagnosis present

## 2020-01-13 DIAGNOSIS — K828 Other specified diseases of gallbladder: Secondary | ICD-10-CM | POA: Diagnosis not present

## 2020-01-13 DIAGNOSIS — M25562 Pain in left knee: Secondary | ICD-10-CM | POA: Diagnosis not present

## 2020-01-13 DIAGNOSIS — Z8739 Personal history of other diseases of the musculoskeletal system and connective tissue: Secondary | ICD-10-CM

## 2020-01-13 DIAGNOSIS — E860 Dehydration: Secondary | ICD-10-CM | POA: Diagnosis not present

## 2020-01-13 DIAGNOSIS — E1122 Type 2 diabetes mellitus with diabetic chronic kidney disease: Secondary | ICD-10-CM | POA: Diagnosis not present

## 2020-01-13 DIAGNOSIS — L89891 Pressure ulcer of other site, stage 1: Secondary | ICD-10-CM | POA: Diagnosis present

## 2020-01-13 DIAGNOSIS — E512 Wernicke's encephalopathy: Principal | ICD-10-CM | POA: Diagnosis present

## 2020-01-13 DIAGNOSIS — E639 Nutritional deficiency, unspecified: Secondary | ICD-10-CM

## 2020-01-13 DIAGNOSIS — E440 Moderate protein-calorie malnutrition: Secondary | ICD-10-CM | POA: Diagnosis not present

## 2020-01-13 DIAGNOSIS — Z452 Encounter for adjustment and management of vascular access device: Secondary | ICD-10-CM | POA: Diagnosis not present

## 2020-01-13 DIAGNOSIS — Z833 Family history of diabetes mellitus: Secondary | ICD-10-CM

## 2020-01-13 DIAGNOSIS — Z4659 Encounter for fitting and adjustment of other gastrointestinal appliance and device: Secondary | ICD-10-CM

## 2020-01-13 DIAGNOSIS — I739 Peripheral vascular disease, unspecified: Secondary | ICD-10-CM | POA: Diagnosis not present

## 2020-01-13 DIAGNOSIS — I959 Hypotension, unspecified: Secondary | ICD-10-CM | POA: Diagnosis not present

## 2020-01-13 DIAGNOSIS — R638 Other symptoms and signs concerning food and fluid intake: Secondary | ICD-10-CM | POA: Diagnosis not present

## 2020-01-13 DIAGNOSIS — M25569 Pain in unspecified knee: Secondary | ICD-10-CM

## 2020-01-13 DIAGNOSIS — M47816 Spondylosis without myelopathy or radiculopathy, lumbar region: Secondary | ICD-10-CM | POA: Diagnosis present

## 2020-01-13 DIAGNOSIS — M2550 Pain in unspecified joint: Secondary | ICD-10-CM | POA: Diagnosis not present

## 2020-01-13 DIAGNOSIS — Z8249 Family history of ischemic heart disease and other diseases of the circulatory system: Secondary | ICD-10-CM

## 2020-01-13 HISTORY — DX: Encephalopathy, unspecified: G93.40

## 2020-01-13 LAB — CBC
HCT: 27.4 % — ABNORMAL LOW (ref 36.0–46.0)
Hemoglobin: 8.6 g/dL — ABNORMAL LOW (ref 12.0–15.0)
MCH: 27 pg (ref 26.0–34.0)
MCHC: 31.4 g/dL (ref 30.0–36.0)
MCV: 86.2 fL (ref 80.0–100.0)
Platelets: 301 10*3/uL (ref 150–400)
RBC: 3.18 MIL/uL — ABNORMAL LOW (ref 3.87–5.11)
RDW: 23.4 % — ABNORMAL HIGH (ref 11.5–15.5)
WBC: 10.3 10*3/uL (ref 4.0–10.5)
nRBC: 0.5 % — ABNORMAL HIGH (ref 0.0–0.2)

## 2020-01-13 LAB — GLUCOSE, CAPILLARY
Glucose-Capillary: 156 mg/dL — ABNORMAL HIGH (ref 70–99)
Glucose-Capillary: 266 mg/dL — ABNORMAL HIGH (ref 70–99)

## 2020-01-13 LAB — CREATININE, SERUM
Creatinine, Ser: 2.12 mg/dL — ABNORMAL HIGH (ref 0.44–1.00)
GFR calc Af Amer: 26 mL/min — ABNORMAL LOW (ref >60)
GFR calc non Af Amer: 23 mL/min — ABNORMAL LOW (ref >60)

## 2020-01-13 LAB — MRSA PCR SCREENING: MRSA by PCR: NEGATIVE

## 2020-01-13 MED ORDER — ACETAMINOPHEN 650 MG RE SUPP: 650.0000 mg | Freq: Four times a day (QID) | RECTAL | Status: DC | PRN

## 2020-01-13 MED ORDER — ORAL CARE MOUTH RINSE
15.0000 mL | Freq: Two times a day (BID) | OROMUCOSAL | Status: DC
  Administered 2020-01-14 – 2020-01-17 (×6): 15 mL via OROMUCOSAL

## 2020-01-13 MED ORDER — SODIUM CHLORIDE 0.9% FLUSH
3.0000 mL | Freq: Two times a day (BID) | INTRAVENOUS | Status: DC
  Administered 2020-01-13 – 2020-01-22 (×17): 3 mL via INTRAVENOUS

## 2020-01-13 MED ORDER — HYDRALAZINE HCL 20 MG/ML IJ SOLN
10.0000 mg | INTRAMUSCULAR | Status: DC | PRN
  Administered 2020-01-13 – 2020-01-15 (×2): 10 mg via INTRAVENOUS
  Administered 2020-01-18: 20 mg via INTRAVENOUS
  Filled 2020-01-13 (×3): qty 1

## 2020-01-13 MED ORDER — SENNOSIDES 8.8 MG/5ML PO SYRP
5.0000 mL | ORAL_SOLUTION | Freq: Two times a day (BID) | ORAL | Status: DC
  Administered 2020-01-13 – 2020-02-08 (×14): 5 mL
  Filled 2020-01-13 (×54): qty 5

## 2020-01-13 MED ORDER — SENNA 8.6 MG PO TABS: 1.0000 | ORAL_TABLET | Freq: Two times a day (BID) | ORAL | Status: DC

## 2020-01-13 MED ORDER — CHLORHEXIDINE GLUCONATE 0.12 % MT SOLN
15.0000 mL | Freq: Two times a day (BID) | OROMUCOSAL | Status: DC
  Administered 2020-01-14 – 2020-02-15 (×58): 15 mL via OROMUCOSAL
  Filled 2020-01-13 (×56): qty 15

## 2020-01-13 MED ORDER — INSULIN ASPART 100 UNIT/ML ~~LOC~~ SOLN: 0.0000 [IU] | Freq: Three times a day (TID) | SUBCUTANEOUS | Status: DC

## 2020-01-13 MED ORDER — THIAMINE HCL 100 MG/ML IJ SOLN
100.0000 mg | Freq: Every day | INTRAMUSCULAR | Status: DC
  Administered 2020-01-13 – 2020-01-22 (×10): 100 mg via INTRAVENOUS
  Filled 2020-01-13 (×12): qty 2

## 2020-01-13 MED ORDER — SODIUM CHLORIDE 0.9 % IV SOLN
250.0000 mL | INTRAVENOUS | Status: DC | PRN
  Administered 2020-01-16 – 2020-01-19 (×4): 250 mL via INTRAVENOUS

## 2020-01-13 MED ORDER — INSULIN DETEMIR 100 UNIT/ML ~~LOC~~ SOLN: 16.0000 [IU] | Freq: Every day | SUBCUTANEOUS | Status: DC

## 2020-01-13 MED ORDER — ACETAMINOPHEN 325 MG PO TABS
650.0000 mg | ORAL_TABLET | Freq: Four times a day (QID) | ORAL | Status: DC | PRN
  Administered 2020-01-14: 650 mg via ORAL
  Filled 2020-01-13: qty 2

## 2020-01-13 MED ORDER — CHLORHEXIDINE GLUCONATE 0.12 % MT SOLN
15.0000 mL | Freq: Two times a day (BID) | OROMUCOSAL | Status: DC
  Administered 2020-01-13 – 2020-01-17 (×4): 15 mL via OROMUCOSAL
  Filled 2020-01-13 (×5): qty 15

## 2020-01-13 MED ORDER — INSULIN DETEMIR 100 UNIT/ML ~~LOC~~ SOLN
10.0000 [IU] | Freq: Every day | SUBCUTANEOUS | Status: DC
  Administered 2020-01-14 – 2020-01-15 (×2): 10 [IU] via SUBCUTANEOUS
  Filled 2020-01-13 (×3): qty 0.1

## 2020-01-13 MED ORDER — HEPARIN SODIUM (PORCINE) 5000 UNIT/ML IJ SOLN
5000.0000 [IU] | Freq: Three times a day (TID) | INTRAMUSCULAR | Status: DC
  Administered 2020-01-13 – 2020-02-09 (×81): 5000 [IU] via SUBCUTANEOUS
  Filled 2020-01-13 (×81): qty 1

## 2020-01-13 MED ORDER — ORAL CARE MOUTH RINSE
15.0000 mL | Freq: Two times a day (BID) | OROMUCOSAL | Status: DC
  Administered 2020-01-15 – 2020-02-14 (×53): 15 mL via OROMUCOSAL

## 2020-01-13 MED ORDER — LEVETIRACETAM 500 MG/5ML IV SOLN
500.0000 mg | Freq: Two times a day (BID) | INTRAVENOUS | Status: DC
  Filled 2020-01-13: qty 5

## 2020-01-13 MED ORDER — LEVETIRACETAM IN NACL 500 MG/100ML IV SOLN
500.0000 mg | Freq: Two times a day (BID) | INTRAVENOUS | Status: AC
  Administered 2020-01-13 – 2020-01-23 (×21): 500 mg via INTRAVENOUS
  Filled 2020-01-13 (×21): qty 100

## 2020-01-13 MED ORDER — METOPROLOL TARTRATE 50 MG PO TABS
50.0000 mg | ORAL_TABLET | Freq: Two times a day (BID) | ORAL | Status: DC
  Administered 2020-01-14: 50 mg via ORAL
  Filled 2020-01-13: qty 1

## 2020-01-13 MED ORDER — OSMOLITE 1.2 CAL PO LIQD
1000.0000 mL | ORAL | Status: DC
  Administered 2020-01-13: 1000 mL
  Filled 2020-01-13 (×2): qty 1000

## 2020-01-13 MED ORDER — SODIUM CHLORIDE 0.9% FLUSH: 3.0000 mL | INTRAVENOUS | Status: DC | PRN

## 2020-01-13 MED ORDER — INSULIN ASPART 100 UNIT/ML ~~LOC~~ SOLN
0.0000 [IU] | SUBCUTANEOUS | Status: DC
  Administered 2020-01-14: 5 [IU] via SUBCUTANEOUS
  Administered 2020-01-14: 8 [IU] via SUBCUTANEOUS
  Administered 2020-01-14: 5 [IU] via SUBCUTANEOUS
  Administered 2020-01-14: 8 [IU] via SUBCUTANEOUS
  Administered 2020-01-14: 3 [IU] via SUBCUTANEOUS
  Administered 2020-01-14 – 2020-01-15 (×2): 5 [IU] via SUBCUTANEOUS
  Administered 2020-01-15: 8 [IU] via SUBCUTANEOUS
  Administered 2020-01-15: 5 [IU] via SUBCUTANEOUS
  Administered 2020-01-15: 8 [IU] via SUBCUTANEOUS
  Administered 2020-01-15: 5 [IU] via SUBCUTANEOUS
  Administered 2020-01-15 – 2020-01-16 (×4): 3 [IU] via SUBCUTANEOUS
  Administered 2020-01-16: 7 [IU] via SUBCUTANEOUS
  Administered 2020-01-17: 3 [IU] via SUBCUTANEOUS
  Administered 2020-01-17: 5 [IU] via SUBCUTANEOUS
  Administered 2020-01-18 (×3): 2 [IU] via SUBCUTANEOUS
  Administered 2020-01-19: 8 [IU] via SUBCUTANEOUS
  Administered 2020-01-19: 3 [IU] via SUBCUTANEOUS
  Administered 2020-01-19 (×2): 5 [IU] via SUBCUTANEOUS
  Administered 2020-01-19: 3 [IU] via SUBCUTANEOUS
  Administered 2020-01-19 – 2020-01-20 (×2): 5 [IU] via SUBCUTANEOUS
  Administered 2020-01-20: 2 [IU] via SUBCUTANEOUS
  Administered 2020-01-20: 3 [IU] via SUBCUTANEOUS
  Administered 2020-01-20 – 2020-01-21 (×5): 5 [IU] via SUBCUTANEOUS
  Administered 2020-01-21: 3 [IU] via SUBCUTANEOUS
  Administered 2020-01-21: 5 [IU] via SUBCUTANEOUS
  Administered 2020-01-21: 3 [IU] via SUBCUTANEOUS
  Administered 2020-01-22: 5 [IU] via SUBCUTANEOUS
  Administered 2020-01-22: 3 [IU] via SUBCUTANEOUS
  Administered 2020-01-22: 8 [IU] via SUBCUTANEOUS
  Administered 2020-01-22: 5 [IU] via SUBCUTANEOUS
  Administered 2020-01-22: 3 [IU] via SUBCUTANEOUS
  Administered 2020-01-22: 8 [IU] via SUBCUTANEOUS
  Administered 2020-01-23 (×2): 3 [IU] via SUBCUTANEOUS
  Administered 2020-01-23 (×2): 5 [IU] via SUBCUTANEOUS
  Administered 2020-01-23 (×2): 8 [IU] via SUBCUTANEOUS
  Administered 2020-01-24: 5 [IU] via SUBCUTANEOUS
  Administered 2020-01-24: 8 [IU] via SUBCUTANEOUS
  Administered 2020-01-24 (×5): 5 [IU] via SUBCUTANEOUS
  Administered 2020-01-25: 3 [IU] via SUBCUTANEOUS
  Administered 2020-01-25 (×3): 5 [IU] via SUBCUTANEOUS
  Administered 2020-01-25: 3 [IU] via SUBCUTANEOUS
  Administered 2020-01-25: 5 [IU] via SUBCUTANEOUS
  Administered 2020-01-26: 3 [IU] via SUBCUTANEOUS
  Administered 2020-01-26: 2 [IU] via SUBCUTANEOUS
  Administered 2020-01-26: 3 [IU] via SUBCUTANEOUS
  Administered 2020-01-26: 2 [IU] via SUBCUTANEOUS
  Administered 2020-01-26: 3 [IU] via SUBCUTANEOUS
  Administered 2020-01-27 (×2): 2 [IU] via SUBCUTANEOUS
  Administered 2020-01-27: 3 [IU] via SUBCUTANEOUS
  Administered 2020-01-27 – 2020-01-28 (×6): 2 [IU] via SUBCUTANEOUS
  Administered 2020-01-28 (×2): 3 [IU] via SUBCUTANEOUS
  Administered 2020-01-28 – 2020-01-29 (×6): 2 [IU] via SUBCUTANEOUS
  Administered 2020-01-30: 3 [IU] via SUBCUTANEOUS
  Administered 2020-01-30 – 2020-01-31 (×3): 2 [IU] via SUBCUTANEOUS
  Administered 2020-01-31 (×3): 3 [IU] via SUBCUTANEOUS
  Administered 2020-02-01 (×4): 2 [IU] via SUBCUTANEOUS
  Administered 2020-02-03 (×3): 3 [IU] via SUBCUTANEOUS
  Administered 2020-02-03: 5 [IU] via SUBCUTANEOUS
  Administered 2020-02-04: 3 [IU] via SUBCUTANEOUS
  Administered 2020-02-04 – 2020-02-06 (×7): 2 [IU] via SUBCUTANEOUS
  Administered 2020-02-06: 6 [IU] via SUBCUTANEOUS
  Administered 2020-02-06: 2 [IU] via SUBCUTANEOUS
  Administered 2020-02-07: 8 [IU] via SUBCUTANEOUS
  Administered 2020-02-07: 2 [IU] via SUBCUTANEOUS

## 2020-01-13 MED ORDER — DOCUSATE SODIUM 50 MG/5ML PO LIQD
100.0000 mg | Freq: Two times a day (BID) | ORAL | Status: DC
  Administered 2020-01-13 – 2020-01-21 (×10): 100 mg
  Filled 2020-01-13 (×22): qty 10

## 2020-01-13 MED ORDER — INSULIN ASPART 100 UNIT/ML ~~LOC~~ SOLN
3.0000 [IU] | SUBCUTANEOUS | Status: DC
  Administered 2020-01-14 – 2020-01-16 (×14): 3 [IU] via SUBCUTANEOUS

## 2020-01-13 MED ORDER — DOCUSATE SODIUM 100 MG PO CAPS: 100.0000 mg | ORAL_CAPSULE | Freq: Two times a day (BID) | ORAL | Status: DC

## 2020-01-13 NOTE — Progress Notes (Signed)
Pickup from ICU. Pt is 74 y/o with PMHx of hypertension, insulin-dependent DM, Stage IV CKD and a history of bariatric surgery who was transferred on 01/13/20 from [facility] in [place] as per family's request. Pt was admitted there on June 15th while traveling. Initial presentation was chest pain, but next day developed AMS of agitation. She had extensive neuro workup including MRI, EEG. LP was not successful. She then develop fever and thrombocytopenia and TTP was ruled out. Bone marrow biopsy was done. 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 Her mentation started to improve but never came back closer to her baseline and now she opens her eyes only, not following any commands. She is currently on Keppra, but family desire it be discontinued. Neurology group is on consult and plan for 24 hour EEG. She is on NGT feeding, and HD catheter for plasmapheresis will be removed in ICU today.

## 2020-01-13 NOTE — Progress Notes (Signed)
[facility] Progress Note Patient Name: Miranda Roman DOB: 09-05-1945 MRN: [id number]   Date of Service  01/13/2020  HPI/Events of Note  Blood sugar 269 mg %, patient is on enteral nutrition.  eICU Interventions  CBG with SSI coverage ordered.        Kerry Kass [name] 01/13/2020, 11:50 PM

## 2020-01-13 NOTE — Progress Notes (Signed)
Pt. Arrived from [facility] via plane and then ambulance. Report received from Flight nurse. Pt. Arrived altered and that is baseline from [place] report; Mute, non-purposeful movement. Tracks with eyes but otherwise only responds to pain. Temporary HD cath in Rt neck. IV consult placed to assess. Small red areas of breakdown also noted in gluteal folds. Barrier cream applied. Pt.'s daughter arrived with patient. Notified Dr. Lynetta Mare of pt.'s arrival.

## 2020-01-13 NOTE — Progress Notes (Signed)
Chaplain responded to Spiritual Consult for prayer at patient bedside for children.  Chaplain found daughter at bedside of her mother who seemed aware Chaplain was there but unable to speak.  Chaplain initiated relationship of care and concern while hearing of this family's journey these last few days of getting their mother home with the help of a [place] and other people who flew their mom from [place] to be back here at her home in [place].  Daughter asked about clergy visitation rules at this time, explaining how her [employer] is between pastors at this time.  Chaplain explain visitation rules now.  Daughter requested prayers for good rest for her mom tonight, for thanksgiving for all the people who have helped them, for strength for herself and her brothers who are on their way here from [place].  Chaplain prayed with daughter and patient.  Chaplain advised we are here 24/7 to be of assistance any time she or her family need anything.  De Burrs Chaplain Resident.

## 2020-01-13 NOTE — Consult Note (Signed)
NAME:  Miranda Roman, MRN:  [id number], DOB:  October 26, 1945, LOS: 0 ADMISSION DATE:  01/13/2020, CONSULTATION DATE:  01/13/20 REFERRING MD:  Jesus Genera, CHIEF COMPLAINT:  Acute encephalopathy   Brief History   74 yo female air-transferred to [facility] from [facility] on 01/13/20 for further evaluation of acute encephalopathy of unknown etiology.  History of present illness   74 yo female with hypertension, insulin-dependent DM, Stage IV CKD and a history of bariatric surgery who was flight medi-vac'd to [facility] on 01/13/20 from [facility] in [place].  Primary historian is her daughter who was at bedside. Charts through care everywhere were also reviewed.  Initial onset of symptoms began 12/21/19 while vacationing in [place] with her family. She had been in her usual state of health in the morning and had been floating on a tube in a river when she slipped off it. She had stubbed her toe but had not reported hitting her head. They had gone back to the air bnb where they were staying. Last that day, she began to complain of chest pain that radiated to her back and was seen in an ED. In the ED, D-dimer was found to be elevated however VQ scan and doppler US was negative. She was admitted to cardiology for further workup which ended up being essentially unremarkable aside from some moderate pulm hypertension.   At some point in that time frame, she developed worsening leukocytosis, febrile, and became encephalopathic. ID and neurology were consulted and she underwent an extensive investigation including brain MRI and LP which were both reportedly unremarkable. EEG was also obtained however did not reveal any findings to contribute to diagnosis.  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 She was started on thiamine.  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 She underwent 5 rounds of plasmapheresis and was started on steroids--no significant improvement in mental status. ADAM TS 13 activity was found to be normal as well.   Due to some sclerosis noted on a CT scan, she underwent a bone marrow biopsy by hematology, to evaluate for lymphoma.  During her time there, family wished for patient to be transferred back to [place] to be closer to family. They had reached out to a senator who was able to connect the patient to [facility] to be transferred back for further care.   Past Medical History  Hypertension, CKD IV, insulin dependent DM, hx of bariatric surgery  Significant Hospital Events   7/9 transferred from [place]:  Neurology  Procedures:  n/a  Significant Diagnostic Tests:  n/a  Micro Data:  n/a  Antimicrobials:  n/a  Interim history/subjective:  See above  Objective   Blood pressure (!) 154/76, pulse (!) 102, temperature 98.3 F (36.8 C), temperature source Axillary, resp. rate 20, SpO2 100 %.       No intake or output data in the 24 hours ending 01/13/20 1856 There were no vitals filed for this visit.  Examination: General: chronically ill appearing female HENT: mucous membranes dry. HD line to right IJ. Poor dentition Lungs: clear. Breathing comfortably on RA Cardiovascular: RRR. No lower extremity edema. Peripheral extremities cool Abdomen: non-distended. Soft. No TTP Extremities: cool Neuro: awake. Does not follow commands. PERRL. Intermittently tracks.  Moves upper extremities intermittently. No asterixis. No tremor.  No myoclonus. Withdraws to pain.  Skin: a few small darkly pigmented skin lesions on soles of feet.   Resolved Hospital Problem list   n/a  Assessment & Plan:   Acute encephalopathy. Despite significant workup in [place], etiology remains unknown. Unclear time frame for onset of encephalopathy as to  whether it was before or after starting cefepime. If after, would consider cefepime neurotoxicity. [facility] was also questioning Wernicke's encephalopathy however I may consider this less likely given the acute onset.  Infectious process was evaluated with LP which did not show any growth however, question a prion disease or other microbe that would not show on a typical LP culture.  Finally, although she did undergo a couple of EEGs there, she reportedly did not have an LTM. She was started on Keppra which her daughter relayed concern and wondering if it could be stopped Plan --Neurology consulted. Discussed with Dr. Leonel Ramsay who agrees to see in A.M. Consider LTM --continue keppra for now. Will defer further continuation or discontinuation to neurology  Insulin dependent DM with insulin pump. Received 5U levamir and SSI while at [facility]. Will continue this here. Monitor CBGs and adjust accordingly. Diabetes coordinator consulted.  CKD IV. Monitor renal function. Avoid nephrotoxicns  Acute on chronic anemia. Check CBC.  Sickle cell trait. B-Thallessemia.   Chronic Hypertension. Continue metoprolol   High risk malnutrition. [place] in place. Continue tube feeds. Dietary consult for adjustments.   Best practice:  Diet: TF Pain/Anxiety/Delirium protocol (if indicated): tylenol VAP protocol (if indicated): n/a DVT prophylaxis: heparin GI prophylaxis: n/a Glucose control: levemir Mobility: BR Code Status: Full Family Communication: daughter updated at bedside. Will stay at bedside HS Disposition:   Labs   CBC: Recent Labs  Lab 01/13/20 1842  WBC 10.3  HGB 8.6*  HCT 27.4*  MCV 86.2  PLT 330    Basic Metabolic Panel: No results for input(s): NA, K, CL, CO2, GLUCOSE, BUN, CREATININE, CALCIUM, MG, PHOS in the last 168 hours. GFR: CrCl cannot be calculated (Patient's most recent lab result is older than the maximum 21 days allowed.). Recent Labs  Lab  01/13/20 1842  WBC 10.3    Liver Function Tests: No results for input(s): AST, ALT, ALKPHOS, BILITOT, PROT, ALBUMIN in the last 168 hours. No results for input(s): LIPASE, AMYLASE in the last 168 hours. No results for input(s): AMMONIA in the last 168 hours.  ABG No results found for: PHART, PCO2ART, PO2ART, HCO3, TCO2, ACIDBASEDEF, O2SAT   Coagulation Profile: No results for input(s): INR, PROTIME in the last 168 hours.  Cardiac Enzymes: No results for input(s): CKTOTAL, CKMB, CKMBINDEX, TROPONINI in the last 168 hours.  HbA1C: Hgb A1c MFr Bld  Date/Time Value Ref Range Status  09/28/2019 11:05 AM 6.1 (H) 4.8 - 5.6 % Final    Comment:             Prediabetes: 5.7 - 6.4          Diabetes: >6.4          Glycemic control for adults with diabetes: <7.0   04/26/2019 08:58 AM 7.4 (H) 4.8 - 5.6 % Final    Comment:    (NOTE)         Prediabetes: 5.7 - 6.4         Diabetes: >6.4         Glycemic control for adults with diabetes: <7.0     CBG: No results for input(s): GLUCAP in the last 168 hours.  Review of  Systems:   Unable to be obtained due to acute encephalopathy  Past Medical History  She,  has a past medical history of Anemia, Diabetes mellitus, Hyperlipidemia, Hypertension, Osteoporosis, and Sickle cell anemia ([facility]).   Surgical History    Past Surgical History:  Procedure Laterality Date  . BONE RESECTION  01/2006   wrist  . 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 TUBAL LIGATION  04/28/1977     Social History   reports that she quit smoking about 14 years ago. Her smoking use included cigarettes. She has a 10.00 pack-year smoking history. She has never used smokeless tobacco. She reports previous alcohol use. She reports that she does not use drugs.   Family History   Her family history includes Diabetes in her father, maternal aunt, maternal grandfather, maternal grandmother, maternal uncle, mother, and sister; Heart disease in her father and mother.    Allergies Allergies  Allergen Reactions  . Cefepime Other (See Comments)    Causes encephalitis - reported by [facility] 12/31/2019     Home Medications  Prior to Admission medications   Medication Sig Start Date End Date Taking? Authorizing Provider  insulin lispro (HUMALOG) 100 UNIT/ML injection Inject 0.04 mLs (4 Units total) into the skin 3 (three) times daily before meals. Sliding scale Patient taking differently: Inject 0-16 Units into the skin See admin instructions. Inject 0-16 units subcutaneously every 4 hours per sliding scale: CBG 60-110 0 units, 111-150 4 units, 151-200 8 units, 201-250 10 units, 251-300 12 units, 301-350 14 units, >350 16 units 01/26/19  Yes Glendale Chard, MD  LEVEMIR FLEXTOUCH 100 UNIT/ML Pen INJECT SUBCUTANEOUSLY 16  UNITS DAILY Patient taking differently: Inject 5 Units into the skin 2 (two) times daily.  06/23/19  Yes Glendale Chard, MD  levETIRAcetam (KEPPRA) 500 MG/5ML injection Inject 500 mg into the vein every 12 (twelve) hours.   Yes [provider]  metoprolol tartrate (LOPRESSOR) 50 MG tablet Take 50 mg by mouth 2 (two) times daily. 01/03/20  Yes [provider]  DEXILANT 60 MG capsule TAKE 1 CAPSULE BY MOUTH  DAILY Patient not taking: Reported on 01/13/2020 08/13/19   Glendale Chard, MD  glucose blood [facility] VERIO) test strip Use as instructed to check blood sugars 1 time per day e11.22 09/23/18   Glendale Chard, MD  loratadine (CLARITIN) 10 MG tablet Take 1 tablet (10 mg total) by mouth daily. 10/28/18 10/28/19  Glendale Chard, MD  [facility] DELICA LANCETS 14J MISC 1 each by Does not apply route daily. DX: E11.65 04/22/18   Glendale Chard, MD     Mitzi Hansen, MD [place] PGY-2 01/13/20  6:56 PM

## 2020-01-13 NOTE — Progress Notes (Signed)
[facility] Progress Note Patient Name: Miranda Roman DOB: 04/06/1946 MRN: [id number]   Date of Service  01/13/2020  HPI/Events of Note  Patient has blood pressure parameters of < 219 mmHg but systolic blood pressure is 169 mmHg. HR 56.  eICU Interventions  PRN Hydralazine ordered for SBP > 160 mmHg.        Kerry Kass Acey [place] 01/13/2020, 8:48 PM

## 2020-01-13 NOTE — Progress Notes (Signed)
R temporary Jugular apheresis catheter removed per order. No active bleeding noted. Sitte clean/dry. No redness or skin irritation. Floor RN aware.

## 2020-01-13 NOTE — Progress Notes (Signed)
Per Dr. Lynetta Mare pt. Does not need to be on isolation.

## 2020-01-14 ENCOUNTER — Inpatient Hospital Stay (HOSPITAL_COMMUNITY): Payer: Medicare Other

## 2020-01-14 ENCOUNTER — Encounter (HOSPITAL_COMMUNITY): Payer: Self-pay | Admitting: Internal Medicine

## 2020-01-14 DIAGNOSIS — E6609 Other obesity due to excess calories: Secondary | ICD-10-CM | POA: Diagnosis not present

## 2020-01-14 DIAGNOSIS — I10 Essential (primary) hypertension: Secondary | ICD-10-CM | POA: Diagnosis not present

## 2020-01-14 DIAGNOSIS — G9340 Encephalopathy, unspecified: Secondary | ICD-10-CM

## 2020-01-14 LAB — COMPREHENSIVE METABOLIC PANEL
ALT: 63 U/L — ABNORMAL HIGH (ref 0–44)
AST: 23 U/L (ref 15–41)
Albumin: 2.2 g/dL — ABNORMAL LOW (ref 3.5–5.0)
Alkaline Phosphatase: 72 U/L (ref 38–126)
Anion gap: 8 (ref 5–15)
BUN: 40 mg/dL — ABNORMAL HIGH (ref 8–23)
CO2: 21 mmol/L — ABNORMAL LOW (ref 22–32)
Calcium: 8.3 mg/dL — ABNORMAL LOW (ref 8.9–10.3)
Chloride: 115 mmol/L — ABNORMAL HIGH (ref 98–111)
Creatinine, Ser: 2.08 mg/dL — ABNORMAL HIGH (ref 0.44–1.00)
GFR calc Af Amer: 27 mL/min — ABNORMAL LOW (ref >60)
GFR calc non Af Amer: 23 mL/min — ABNORMAL LOW (ref >60)
Glucose, Bld: 281 mg/dL — ABNORMAL HIGH (ref 70–99)
Potassium: 3.7 mmol/L (ref 3.5–5.1)
Sodium: 144 mmol/L (ref 135–145)
Total Bilirubin: 0.5 mg/dL (ref 0.3–1.2)
Total Protein: 5.4 g/dL — ABNORMAL LOW (ref 6.5–8.1)

## 2020-01-14 LAB — GLUCOSE, CAPILLARY
Glucose-Capillary: 167 mg/dL — ABNORMAL HIGH (ref 70–99)
Glucose-Capillary: 208 mg/dL — ABNORMAL HIGH (ref 70–99)
Glucose-Capillary: 219 mg/dL — ABNORMAL HIGH (ref 70–99)
Glucose-Capillary: 220 mg/dL — ABNORMAL HIGH (ref 70–99)
Glucose-Capillary: 227 mg/dL — ABNORMAL HIGH (ref 70–99)
Glucose-Capillary: 253 mg/dL — ABNORMAL HIGH (ref 70–99)

## 2020-01-14 LAB — HEMOGLOBIN A1C
Hgb A1c MFr Bld: 7.1 % — ABNORMAL HIGH (ref 4.8–5.6)
Mean Plasma Glucose: 157.07 mg/dL

## 2020-01-14 LAB — CBC
HCT: 26.8 % — ABNORMAL LOW (ref 36.0–46.0)
Hemoglobin: 8.2 g/dL — ABNORMAL LOW (ref 12.0–15.0)
MCH: 26.1 pg (ref 26.0–34.0)
MCHC: 30.6 g/dL (ref 30.0–36.0)
MCV: 85.4 fL (ref 80.0–100.0)
Platelets: 276 10*3/uL (ref 150–400)
RBC: 3.14 MIL/uL — ABNORMAL LOW (ref 3.87–5.11)
RDW: 23.7 % — ABNORMAL HIGH (ref 11.5–15.5)
WBC: 9.8 10*3/uL (ref 4.0–10.5)
nRBC: 0.5 % — ABNORMAL HIGH (ref 0.0–0.2)

## 2020-01-14 LAB — AMMONIA: Ammonia: 26 umol/L (ref 9–35)

## 2020-01-14 LAB — C-REACTIVE PROTEIN: CRP: 0.7 mg/dL (ref <1.0)

## 2020-01-14 LAB — SEDIMENTATION RATE: Sed Rate: 35 mm/hr — ABNORMAL HIGH (ref 0–22)

## 2020-01-14 MED ORDER — POLYETHYLENE GLYCOL 3350 17 G PO PACK: 17.0000 g | PACK | Freq: Every day | ORAL | Status: DC | PRN

## 2020-01-14 MED ORDER — ACETAMINOPHEN 650 MG RE SUPP: 650.0000 mg | Freq: Four times a day (QID) | RECTAL | Status: DC | PRN

## 2020-01-14 MED ORDER — ACETAMINOPHEN 325 MG PO TABS
650.0000 mg | ORAL_TABLET | Freq: Four times a day (QID) | ORAL | Status: DC | PRN
  Administered 2020-01-15 – 2020-02-01 (×14): 650 mg
  Filled 2020-01-14 (×15): qty 2

## 2020-01-14 MED ORDER — SENNOSIDES-DOCUSATE SODIUM 8.6-50 MG PO TABS: 2.0000 | ORAL_TABLET | Freq: Every evening | ORAL | Status: DC | PRN

## 2020-01-14 MED ORDER — PROSOURCE TF PO LIQD
45.0000 mL | Freq: Two times a day (BID) | ORAL | Status: DC
  Administered 2020-01-14 – 2020-01-25 (×22): 45 mL
  Filled 2020-01-14 (×24): qty 45

## 2020-01-14 MED ORDER — VITAL 1.5 CAL PO LIQD
1000.0000 mL | ORAL | Status: AC
  Administered 2020-01-14 – 2020-01-25 (×7): 1000 mL
  Filled 2020-01-14 (×16): qty 1000

## 2020-01-14 MED ORDER — METOPROLOL TARTRATE 50 MG PO TABS
50.0000 mg | ORAL_TABLET | Freq: Two times a day (BID) | ORAL | Status: DC
  Administered 2020-01-14 – 2020-01-20 (×9): 50 mg
  Filled 2020-01-14 (×10): qty 1

## 2020-01-14 MED ORDER — AMANTADINE HCL 100 MG PO CAPS
100.0000 mg | ORAL_CAPSULE | Freq: Every day | ORAL | Status: DC
  Administered 2020-01-15 – 2020-01-16 (×2): 100 mg
  Filled 2020-01-14 (×6): qty 1

## 2020-01-14 MED ORDER — POLYETHYLENE GLYCOL 3350 17 G PO PACK
17.0000 g | PACK | Freq: Every day | ORAL | Status: DC | PRN
  Filled 2020-01-14: qty 1

## 2020-01-14 MED ORDER — AMANTADINE HCL 100 MG PO CAPS
100.0000 mg | ORAL_CAPSULE | Freq: Every day | ORAL | Status: DC
  Administered 2020-01-14: 100 mg via ORAL
  Filled 2020-01-14: qty 1

## 2020-01-14 MED ORDER — LABETALOL HCL 5 MG/ML IV SOLN
10.0000 mg | INTRAVENOUS | Status: DC | PRN
  Administered 2020-01-14 – 2020-01-15 (×2): 10 mg via INTRAVENOUS
  Administered 2020-01-17 – 2020-01-18 (×2): 20 mg via INTRAVENOUS
  Filled 2020-01-14 (×5): qty 4

## 2020-01-14 NOTE — Consult Note (Signed)
Requesting Physician: Dr. Agarwaka    Chief Complaint: Encephalopathy  History obtained from: Patient and Chart    HPI:                                                                                                                                       Miranda Roman is a 74 y.o. female with past medical history significant hypertension, insulin-dependent diabetes mellitus, CKD stage IV, hyperlipidemia,?  Sickle cell straight versus sickle cell anemia transfer to [facility] on 01/13/2020 from [facility] in [place] with persistent encephalopathy.  Patient initially presented on June 15 with chest pain to [facility].  Work-up included CTA which ruled out dissection.  While admitted, patient became febrile and confused.  Her platelets were also low and therefore TTP also suspected and patient received plasmapheresis.  AdamsT13 and bone marrow negative.    Neurology was consulted for encephalopathy in the setting of fever-CT head and MRI brain without contrast were performed which were normal.  LP was performed by fluoroscopy on 6/18 after initial attempt by neurology at bedside, CSF was not suggestive for infection and showed 67 protein, WBC count of 2 and RBC 3. Meningoencephalitis panel was sent and was negative, viral atypical pneumonia PCR panel also negative including COVID-19.  Patient also had multiple EEGs throughout her stay, none of them showed epileptiform discharges.  Patient received cefepime and this was also discontinued with no improvement.  Patient tested for nutritional deficiencies also came back negative except for vitamin D which was 14.  With normal B12, folate, MMA and iron studies and B2.  Thiamine level was not checked.  Thyroid function tests including TSH and T4 normal, ammonia normal.  Vasculitis work-up including ANCA, anti-GBM negative  She has persistently stayed encephalopathic and family requested her to be transferred back to [place].  On assessment patient is lethargic, nonverbal and not following any commands.  Other work-up included Repeat MRI brain without contrast on 7/2 Copper, slightly high B2: 34, high CRP elevated 4.5 Zinc normal Heavy metal screen negative Stool occult blood positive APTT elevated Lyme disease, MAB, arbovirus panel and West Nile all negative  Past Medical History:  Diagnosis Date  . Anemia   . Diabetes mellitus   . Hyperlipidemia   . Hypertension   . Osteoporosis   . Sickle cell anemia (HCC)     Past Surgical History:  Procedure Laterality Date  . BONE RESECTION  01/2006   wrist  . 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 TUBAL LIGATION  04/28/1977    Family History  Problem Relation Age of Onset  . Diabetes Mother   . Heart disease Mother   . Diabetes Father   . Heart disease Father   . Diabetes Sister   . Diabetes Maternal Aunt   . Diabetes Maternal Uncle   . Diabetes Maternal Grandmother   . Diabetes Maternal   Grandfather    Social History:  reports that she quit smoking about 14 years ago. Her smoking use included cigarettes. She has a 10.00 pack-year smoking history. She has never used smokeless tobacco. She reports previous alcohol use. She reports that she does not use drugs.  Allergies:  Allergies  Allergen Reactions  . Cefepime Other (See Comments)    Causes encephalitis - reported by [facility] 12/31/2019    Medications:                                                                                                                        I reviewed home medications   ROS:                                                                                                                                     Unable to review systems due to patient's mental status   Examination:                                                                                                      General: Appears well-developed  Psych: Abulic, obtunded Eyes: No scleral  injection HENT: No OP obstrucion Head: Normocephalic.  Cardiovascular: Normal rate and regular rhythm.  Respiratory: Effort normal and breath sounds normal to anterior ascultation GI: Soft.  No distension. There is no tenderness.  Skin: WDI    Neurological Examination Mental Status: Lethargic, opens eyes spontaneously.  Does not follow any commands and nonverbal.  Moans to noxious stimulus.  Intermittently tracks examiner across the room Cranial Nerves: II: Visual fields : Difficult to assess III,IV, VI: ptosis not present, doll's present, pupils equal, round, reactive to light and accommodation V,VII: No obvious facial weakness observedally XII: midline tongue extension Motor: Occasional spontaneous movements but mostly withdraws only to noxious stimulus in all four extremities equally. Tone and bulk:normal tone throughout; no atrophy noted Sensory: Withdraws to pain bilaterally Deep Tendon Reflexes: Slightly brisk in all four extremities Plantars: Right: downgoing     Left: downgoing Cerebellar: Unable to assess due to mental status      Lab Results: Basic Metabolic Panel: Recent Labs  Lab 01/13/20 1842  CREATININE 2.12*    CBC: Recent Labs  Lab 01/13/20 1842 01/14/20 0514  WBC 10.3 9.8  HGB 8.6* 8.2*  HCT 27.4* 26.8*  MCV 86.2 85.4  PLT 301 276    Coagulation Studies: No results for input(s): LABPROT, INR in the last 72 hours.  Imaging: No results found.   ASSESSMENT AND PLAN   74 y.o. female with past medical history significant hypertension, insulin-dependent diabetes mellitus, CKD stage IV, hyperlipidemia,?  Sickle cell straight versus sickle cell anemia transfer to [facility] on 01/13/2020 from [facility] in [place] with persistent encephalopathy since June 16.  Patient alert oriented x3 when she presented to the hospital-given fairly acute onset of altered mental status, less likely this would be paraneoplastic/autoimmune or prion  disease.  Wernicke's is a consideration, B1 levels was not drawn and patient did not have other nutritional deficiencies.  It has been several days since she received cefepime  with no improvement making this the cause of encephalopathy also less likely.  Her BUN is slightly elevated however not sufficient enough to cause this degree of neurological impairment.    This could be multifactorial in the setting of prolonged hospitalization, fever of unclear etiology as well as possible TTP, possibly poor cognitive reserve (we will see how much atrophy is present and on MRI brain), acute kidney injury.  Persistent encephalopathy  Recommendations Repeat MRI brain with and without contrast Check thiamine level, repeat ammonia Control blood pressure below 703 systolic range Repeat routine EEG, if negative may  discontinue Keppra Check anti-TPO antibodies Repeat ESR, CRP Minimize sedating medications Frequent neurochecks   Edric Fetterman [facility] Pager Number [phone]

## 2020-01-14 NOTE — Evaluation (Signed)
Physical Therapy Evaluation Patient Details Name: Miranda Roman MRN: [id number] DOB: 11-Feb-1946 Today's Date: 01/14/2020   History of Present Illness  Pateint is a 74 year old female. 3 weeks prior she injured her foot tubing on vacation. The next day she began to have chest pain. She became a-fibrile and since that point has had acute encephalopathy. She was flown from [place] to [place] where she lives. She is not able to follow commands at this time   Clinical Impression  Pateint currently on continuous EEG. Unable to assess mobility. Patient unable to follow commands at this time. All joints assessed . Patient had no contractures or abnormal tone in her joints. Skilled therapy will continue to follow. We will assess mobility when appropriate.     Follow Up Recommendations LTACH;SNF    Equipment Recommendations  None recommended by PT    Recommendations for Other Services       Precautions / Restrictions Precautions Precautions: None Restrictions Weight Bearing Restrictions: No      Mobility  Bed Mobility unable to assess 2nd to EEG                General bed mobility comments: unable to follow commands to assess   Transfers                    Ambulation/Gait                Stairs            Wheelchair Mobility    Modified Rankin (Stroke Patients Only)       Balance       unable to sit the patient up on the edge of the bed                                        Pertinent Vitals/Pain Pain Assessment:  (did not show non verbal sings of pain )    Home Living Family/patient expects to be discharged to:: Private residence Living Arrangements: Alone Available Help at Discharge: Family Type of Home: House Home Access: Stairs to enter   Technical brewer of Steps: 2 Home Layout: One level Home Equipment: Walker - 2 wheels      Prior Function Level of Independence: Independent         Comments:  Per daughter patient was very active. Patient unable to answer questions      Hand Dominance   Dominant Hand: Right    Extremity/Trunk Assessment   Upper Extremity Assessment Upper Extremity Assessment: Difficult to assess due to impaired cognition    Lower Extremity Assessment Lower Extremity Assessment: Difficult to assess due to impaired cognition    Cervical / Trunk Assessment Cervical / Trunk Assessment: Normal  Communication   Communication: No difficulties  Cognition Arousal/Alertness: Lethargic                                     General Comments: patient opened her eyes and tranced therapy but was non verbal and followed no comommands       General Comments General comments (skin integrity, edema, etc.): on EEG     Exercises Other Exercises Other Exercises: PROM perfromed on all major joints. No contractures present.    Assessment/Plan    PT Assessment Patient needs continued PT services  PT Problem List Decreased strength;Decreased range of motion;Decreased activity tolerance;Decreased balance;Decreased mobility;Decreased coordination;Decreased cognition       PT Treatment Interventions Functional mobility training;Patient/family education;Gait training;Neuromuscular re-education;Therapeutic activities;Therapeutic exercise    PT Goals (Current goals can be found in the Care Plan section)  Acute Rehab PT Goals Patient Stated Goal: unable to state  PT Goal Formulation: With family Time For Goal Achievement: 01/21/20    Frequency Min 1X/week (adjust when patient becomes more able to paraticipate)   Barriers to discharge        Co-evaluation               AM-PAC PT "6 Clicks" Mobility  Outcome Measure Help needed turning from your back to your side while in a flat bed without using bedrails?: Total Help needed moving from lying on your back to sitting on the side of a flat bed without using bedrails?: Total Help needed moving to  and from a bed to a chair (including a wheelchair)?: Total Help needed standing up from a chair using your arms (e.g., wheelchair or bedside chair)?: Total Help needed to walk in hospital room?: Total Help needed climbing 3-5 steps with a railing? : Total 6 Click Score: 6    End of Session   Activity Tolerance: Patient limited by lethargy Patient left: with bed alarm set;with family/visitor present Nurse Communication: Mobility status PT Visit Diagnosis: Other abnormalities of gait and mobility (R26.89);Muscle weakness (generalized) (M62.81)    Time: 7680-8811 PT Time Calculation (min) (ACUTE ONLY): 22 min   Charges:   PT Evaluation $PT Eval Moderate Complexity: 1 Mod           Carney Living PT DPT  01/14/2020, 1:25 PM

## 2020-01-14 NOTE — Progress Notes (Signed)
Red MEWS due to HR and RR. Paged Lucile Shutters, MD, notified charge nurse and rapid response. Completed focused assessment and administered PRN meds. Will implement MEWS protocol vitals q1 x 4.    01/14/20 0321  Assess: MEWS Score  Temp 99.3 F (37.4 C)  BP (!) 180/88  Pulse Rate (!) 122  Resp (!) 28  Level of Consciousness Alert  SpO2 100 %  O2 Device Room Air  Assess: MEWS Score  MEWS Temp 0  MEWS Systolic 0  MEWS Pulse 2  MEWS RR 2  MEWS LOC 0  MEWS Score 4  MEWS Score Color Red  Assess: if the MEWS score is Yellow or Red  Were vital signs taken at a resting state? Yes  Focused Assessment Documented focused assessment  Early Detection of Sepsis Score *See Row Information* Low  MEWS guidelines implemented *See Row Information* Yes  Treat  MEWS Interventions Escalated (See documentation below);Administered prn meds/treatments  Take Vital Signs  Increase Vital Sign Frequency  Red: Q 1hr X 4 then Q 4hr X 4, if remains red, continue Q 4hrs  Escalate  MEWS: Escalate Red: discuss with charge nurse/RN and provider, consider discussing with RRT  Notify: Charge Nurse/RN  Name of Charge Nurse/RN Notified Raymond RN  Date Charge Nurse/RN Notified 01/14/20  Time Charge Nurse/RN Notified 0325  Notify: Provider  Provider Name/Title Ogan MD  Date Provider Notified 01/14/20  Time Provider Notified [phone]  Notification Type Page  Notification Reason Change in status (Red MEWS)  Response See new orders (labetalol )  Date of Provider Response 01/14/20  Time of Provider Response 0329  Notify: Rapid Response  Name of Rapid Response RN Notified Mindy RN  Date Rapid Response Notified 01/14/20  Time Rapid Response Notified 0328

## 2020-01-14 NOTE — Progress Notes (Addendum)
LTM EEG hooked up and running - no initial skin breakdown - push button tested - neuro notified. Monitored

## 2020-01-14 NOTE — Progress Notes (Signed)
[facility] Progress Note Patient Name: Miranda Roman DOB: 01/07/1946 MRN: [id number]   Date of Service  01/14/2020  HPI/Events of Note  Systolic blood pressure 032 mmHg, Heart rate 122 (sinus tachycardia)  eICU Interventions  Labetalol 10-20 mg iv Q 2 hours PRN SBP > 160 mmHg ordered.        [name] 01/14/2020, 3:31 AM

## 2020-01-14 NOTE — Progress Notes (Addendum)
No significant changes.      [place] of OSH stay:  She initially went to the outside hospital with chest pain.  She was fine for the first day, but then subsequently developed a fairly significant delirium that lasted for a couple of days.   she had some improvement and was okay for a day or two, but then on day four or five of the hospitalization, her mental status began to go downhill.  She was also thrombocytopenic.  And there was some concern for TTP, she was started on emergent plasma exchange.  ADAMTS13 ended up being negative, but it is noted that HUS was still considered possibility.  She was to the point where she was in the intensive care unit.  It was around that time that she was started on steroids as well as IV thiamine given the concern for her gastric bypass, there was some concern that this might be Wernicke's.  Multiple MRIs were negative.  Per her daughter, she began having improvement around the time that the steroids and thiamine are started.  And she has been having some slow progress since that time.  She was started on keppra empirically, though no definite seizures have been identified.   Interventions attempted PLEX 06/19 IV steroids 07/01 High dose thiamine 500 mg 3 times daily for 3 days  Other work-up included Repeat MRI brain without contrast on 7/2 Copper, slightly high B2: 34, high CRP elevated 4.5 Zinc normal Heavy metal screen negative Stool occult blood positive APTT elevated Lyme disease, MAB, arbovirus panel and West Nile all negative Infectious Encephalitis PCR panel, negative Paraneoplastic/autoimmune encephalitis panel -negative  CSF WBC 2 CSF RBC 3 CSF glucose 165 CSF protein 67(UL nml 45)    Impression: 74 year old female with relatively abrupt decline in her mental status in conjunction with chest pain, thrombocytopenia.  The abrupt and profound change coupled with normal imaging is fairly unusual.  The fact that she responded to either  thiamine or steroids (due to timing unclear which) could be suggestive that this is either Wernicke's or an autoimmune process.  Repeat imaging could be helpful to suggest Wernicke's, as with this profound change, I would expect imaging changes at this point.  She did undergo plasma exchange prior to her autoimmune encephalitis panel which could certainly change those results.  Plasma exchange is good at removing serum antibodies, but if there is intrathecal production, there is thought that this could be less of an ideal intervention.  Given how debilitated she still is, I do think I would favor considering IVIG.  I would like to repeat an LP prior to starting IVIG and sending CSF autoimmune encephalopathy panel given that there may be a higher yield at this time.  If we start IVIG first, then this could confuse the results of that.  1) repeat MRI brain pending 2) consider IVIG and repeat LP. 3) Overnight EEG  Roland Rack, MD [facility] [phone]  If 7pm- 7am, please page neurology on call as listed in [place]. \

## 2020-01-14 NOTE — Progress Notes (Signed)
Initial Nutrition Assessment  DOCUMENTATION CODES:   Not applicable  INTERVENTION:  D/c Osmolite 1.2  Initiate Vital 1.5 @ 35 ml/hr, advance as tolerated 10 ml every 4 hours to goal rate 55 ml/hr with 30 ml Prosource TF via NGT twice daily -Provides 2060 kcal, 111 grams of protein, and 1003 ml free water Free water flushes per MD   NUTRITION DIAGNOSIS:   Inadequate oral intake related to inability to eat as evidenced by NPO status.  GOAL:   Patient will meet greater than or equal to 90% of their needs  MONITOR:   Labs, I & O's, TF tolerance, Weight trends, Diet advancement  REASON FOR ASSESSMENT:   Consult Enteral/tube feeding initiation and management  ASSESSMENT:  RD working remotely.  74 year old female transferred from [facility] in [place] per family's request after being admitted there initially for chest pain on June 15th while vacationing. Patient subesequently developed AMS, fever, thrombocytopenia, TTP was ruled out, underwent 5 rounds of plasmapheresis and started on steroids with no significant improvement. Past medical history significant for HTN, CKD IV, IDDM, history of gastric bypass (2011).  Patient opens eyes only, not following commands, neurology following. Plans for 24 hour EEG, HD catheter for plasmapheresis will be removed today.   NGT in left nare, present on admission. Currently infusing Osmolite 1.2 @ 50 ml/hr providing 1440 kcal, 67 grams protein, 984 ml free water. Will switch to Vital 1.5 to better meet needs, regimen outlined above.   Mild pitting generalized edema per RN assessment Per chart, weights stable over the past 4 months.  Medications reviewed and include: Colace, Senokot, SSI, Levemir, Thiamine, Keppra Labs: CBGs [phone] BUN 40 (H), Cr 2.08 (H)   NUTRITION - FOCUSED PHYSICAL EXAM: Unable to complete at this time, RD working remotely.  Diet Order:   Diet Order            Diet NPO time specified  Diet effective  now                 EDUCATION NEEDS:   No education needs have been identified at this time  Skin:  Skin Assessment: Skin Integrity Issues: Skin Integrity Issues:: Stage I, Other (Comment) Stage I: perineum Other: MASD;bilateral buttocks;groin  Last BM:  7/10 type 6  Height:   Ht Readings from Last 1 Encounters:  09/28/19 5' 4.8" (1.646 m)    Weight:   Wt Readings from Last 1 Encounters:  01/14/20 78.3 kg    BMI:  Body mass index is 28.9 kg/m.  Estimated Nutritional Needs:   Kcal:  2000-2200  Protein:  95-110  Fluid:  >/= 2 L/day   Lajuan Lines, RD, LDN Clinical Nutrition After Hours/Weekend Pager # in [place]

## 2020-01-14 NOTE — Progress Notes (Signed)
PROGRESS NOTE    Miranda Roman  ENI:[id number] DOB: 1945/11/07 DOA: 01/13/2020 PCP: Glendale Chard, MD   Brief Narrative:  74 year old with history of insulin-dependent diabetes, stage IV CKD, bariatric surgery transferred here from [place].  Initially presented there on 12/21/2019 with chest pain.  VQ scan/Dopplers were negative, cardiac work-up showed moderate pulmonary hypertension.  Hospital course complicated by encephalopathy, leukocytosis and fever.  ID and neurology were consulted, underwent MRI brain and lumbar puncture both were negative.  EEG was unrevealing.  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  ADAM TS 13 was normal.  Bone marrow biopsy performed.  With family's wishes patient was transferred to [facility].   Assessment & Plan:   Active Problems:   Pressure injury of skin   Acute encephalopathy  Acute encephalopathy -Etiology still remains unclear.  Cefepime toxicity?,  Warnicke encephalopathy?.  EEGs in [place] were negative, on Keppra. -LP 6/18-negative for infection -B12, folate, iron studies, MMA-normal -TSH, ammonia-normal -Autoimmune studies--normal -Heavy metal screen-normal -Lyme disease/MAB/arbovirus panel/West Nile-negative -Anti-TPO antibody- -Thiamine, ammonia -Neurology consulted -Long-term monitoring/EEG ordered -Continue Keppra 500 mg twice daily IV, family wishes this to be stopped  Diabetes mellitus type 2, insulin-dependent -Has insulin pump but currently on Levemir 10 units daily, NovoLog 3 units every 4 hours and insulin sliding scale -Continue Accu-Cheks  Vitamin D deficiency -Vitamin D levels 14.  Getting feeding tube, eventually can be on supplements  Anemia of chronic disease -Previously seen by hematology at [facility]  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  CKD stage  IV -Creatinine appears to be around baseline of 2.4.  Today 2.08.  Essential hypertension -Metoprolol 50 mg twice daily  Mild to moderate protein calorie malnutrition/dysphagia -Currently feeding tube in place.  Previously seen by GI, due to history of gastric bypass will need placement of feeding tube by general surgery if proceed that route.  Chest pain resolved -Echocardiogram showed preserved EF with moderate pulmonary hypertension.  L1-L5 vertebral body/sacral sclerosis -Previously noted by hematology at [place] secondary to sickle cell trait   DVT prophylaxis: Subcu heparin Code Status: Full code Family Communication: Had extensive discussion with the patient's daughter at bedside and her son [name] over the phone  Status is: Inpatient  Remains inpatient appropriate because:Inpatient level of care appropriate due to severity of illness   Dispo: The patient is from: Home              Anticipated d/c is to: To be determined              Anticipated d/c date is: > 3 days              Patient currently is not medically stable to d/c.  Still unknown etiology of patient's encephalopathy, ongoing work-up including EEG, MRI brain.  Neurology following.  Thereafter will have to establish long-term goals of care and feeding.       Body mass index is 28.9 kg/m.  Pressure Injury 01/13/20 Perineum Right;Left Stage 1 -  Intact skin with non-blanchable redness of a localized area usually over a bony prominence. small areas of skin breakdown between gluteal folds (Active)  01/13/20 1645  Location: Perineum  Location Orientation: Right;Left  Staging: Stage 1 -  Intact skin with non-blanchable redness of a localized area usually over a bony prominence.  Wound Description (Comments): small areas of skin breakdown between gluteal folds  Present  on Admission: Yes (from [facility])   Subjective: Patient is sleeping, moving her extremities and not sure if this is  voluntarily.  Met with the patient's daughter at bedside extensive conversation regarding her recent hospital events.  Review of Systems Otherwise negative except as per HPI, including: Difficult to obtain from the patient due to her mentation  Examination:  Constitutional: Not in acute distress Respiratory: Clear to auscultation bilaterally Cardiovascular: Sinus tachycardia, normal sinus rhythm, no rubs Abdomen: Nontender nondistended good bowel sounds Musculoskeletal: No edema noted Skin: No rashes seen Neurologic: Unable to assess Psychiatric: Poor judgment  Dobbhoff/feeding tube in place  Objective: Vitals:   01/14/20 0521 01/14/20 0542 01/14/20 0621 01/14/20 0721  BP: (!) 164/77 (!) 151/84 (!) 157/77 121/65  Pulse: (!) 110  (!) 110 (!) 106  Resp: (!) 22  (!) 26 20  Temp: 98.7 F (37.1 C)  98.4 F (36.9 C) 98.8 F (37.1 C)  TempSrc: Axillary  Axillary Axillary  SpO2: 100%  100% 100%  Weight:        Intake/Output Summary (Last 24 hours) at 01/14/2020 0819 Last data filed at 01/14/2020 0749 Gross per 24 hour  Intake 517.5 ml  Output 700 ml  Net -182.5 ml   Filed Weights   01/14/20 0314  Weight: 78.3 kg     Data Reviewed:   CBC: Recent Labs  Lab 01/13/20 1842 01/14/20 0514  WBC 10.3 9.8  HGB 8.6* 8.2*  HCT 27.4* 26.8*  MCV 86.2 85.4  PLT 301 161   Basic Metabolic Panel: Recent Labs  Lab 01/13/20 1842 01/14/20 0514  NA  --  144  K  --  3.7  CL  --  115*  CO2  --  21*  GLUCOSE  --  281*  BUN  --  40*  CREATININE 2.12* 2.08*  CALCIUM  --  8.3*   GFR: Estimated Creatinine Clearance: 25.2 mL/min (A) (by C-G formula based on SCr of 2.08 mg/dL (H)). Liver Function Tests: Recent Labs  Lab 01/14/20 0514  AST 23  ALT 63*  ALKPHOS 72  BILITOT 0.5  PROT 5.4*  ALBUMIN 2.2*   No results for input(s): LIPASE, AMYLASE in the last 168 hours. No results for input(s): AMMONIA in the last 168 hours. Coagulation Profile: No results for input(s):  INR, PROTIME in the last 168 hours. Cardiac Enzymes: No results for input(s): CKTOTAL, CKMB, CKMBINDEX, TROPONINI in the last 168 hours. BNP (last 3 results) No results for input(s): PROBNP in the last 8760 hours. HbA1C: Recent Labs    01/14/20 0514  HGBA1C 7.1*   CBG: Recent Labs  Lab 01/13/20 1958 01/13/20 2337 01/14/20 0352 01/14/20 0751  GLUCAP 156* 266* 253* 219*   Lipid Profile: No results for input(s): CHOL, HDL, LDLCALC, TRIG, CHOLHDL, LDLDIRECT in the last 72 hours. Thyroid Function Tests: No results for input(s): TSH, T4TOTAL, FREET4, T3FREE, THYROIDAB in the last 72 hours. Anemia Panel: No results for input(s): VITAMINB12, FOLATE, FERRITIN, TIBC, IRON, RETICCTPCT in the last 72 hours. Sepsis Labs: No results for input(s): PROCALCITON, LATICACIDVEN in the last 168 hours.  Recent Results (from the past 240 hour(s))  MRSA PCR Screening     Status: None   Collection Time: 01/13/20  8:30 PM   Specimen: Nasal Mucosa; Nasopharyngeal  Result Value Ref Range Status   MRSA by PCR NEGATIVE NEGATIVE Final    Comment:        The GeneXpert MRSA Assay (FDA approved for NASAL specimens only), is one component of  a comprehensive MRSA colonization surveillance program. It is not intended to diagnose MRSA infection nor to guide or monitor treatment for MRSA infections. Performed at [facility], [place] [address] [place], [place] [address]          Radiology Studies: No results found.      Scheduled Meds: . chlorhexidine  15 mL Mouth Rinse BID  . chlorhexidine  15 mL Mouth Rinse BID  . docusate  100 mg Per Tube BID  . heparin  5,000 Units Subcutaneous Q8H  . insulin aspart  0-15 Units Subcutaneous Q4H  . insulin aspart  3 Units Subcutaneous Q4H  . insulin detemir  10 Units Subcutaneous QHS  . mouth rinse  15 mL Mouth Rinse q12n4p  . mouth rinse  15 mL Mouth Rinse q12n4p  . metoprolol tartrate  50 mg Oral BID  . sennosides  5 mL Per Tube BID  .  sodium chloride flush  3 mL Intravenous Q12H  . thiamine injection  100 mg Intravenous Daily   Continuous Infusions: . sodium chloride    . feeding supplement (OSMOLITE 1.2 CAL) 1,000 mL (01/13/20 2039)  . levETIRAcetam 500 mg (01/13/20 2143)     LOS: 1 day   Time spent= 35 mins    Manolo Bosket Arsenio Loader, MD [facility]  If 7PM-7AM, please contact night-coverage  01/14/2020, 8:19 AM

## 2020-01-14 NOTE — Progress Notes (Signed)
Pt previously had red MEWS, currently implementing red MEWS protocol with Q1 vitals until 0721.    01/14/20 0621  Assess: MEWS Score  Temp 98.4 F (36.9 C)  BP (!) 157/77  Pulse Rate (!) 110  ECG Heart Rate (!) 112  Resp (!) 26  Level of Consciousness Alert  SpO2 100 %  O2 Device Room Air  Assess: MEWS Score  MEWS Temp 0  MEWS Systolic 0  MEWS Pulse 2  MEWS RR 2  MEWS LOC 0  MEWS Score 4  MEWS Score Color Red  Assess: if the MEWS score is Yellow or Red  Were vital signs taken at a resting state? Yes  Focused Assessment Documented focused assessment  Early Detection of Sepsis Score *See Row Information* Medium  MEWS guidelines implemented *See Row Information* No, previously red, continue vital signs every 4 hours (will continune with red MEWS protocol)  Notify: Charge Nurse/RN  Name of Charge Nurse/RN Notified Raymond RN  Date Charge Nurse/RN Notified 01/14/20  Time Charge Nurse/RN Notified [phone]

## 2020-01-15 ENCOUNTER — Inpatient Hospital Stay (HOSPITAL_COMMUNITY): Payer: Medicare Other

## 2020-01-15 DIAGNOSIS — E6609 Other obesity due to excess calories: Secondary | ICD-10-CM | POA: Diagnosis not present

## 2020-01-15 DIAGNOSIS — G9340 Encephalopathy, unspecified: Secondary | ICD-10-CM | POA: Diagnosis not present

## 2020-01-15 DIAGNOSIS — I10 Essential (primary) hypertension: Secondary | ICD-10-CM | POA: Diagnosis not present

## 2020-01-15 LAB — THYROID PEROXIDASE ANTIBODY: Thyroperoxidase Ab SerPl-aCnc: <9 IU/mL (ref 0–34)

## 2020-01-15 LAB — BASIC METABOLIC PANEL
Anion gap: 11 (ref 5–15)
BUN: 42 mg/dL — ABNORMAL HIGH (ref 8–23)
CO2: 19 mmol/L — ABNORMAL LOW (ref 22–32)
Calcium: 8.3 mg/dL — ABNORMAL LOW (ref 8.9–10.3)
Chloride: 116 mmol/L — ABNORMAL HIGH (ref 98–111)
Creatinine, Ser: 1.85 mg/dL — ABNORMAL HIGH (ref 0.44–1.00)
GFR calc Af Amer: 31 mL/min — ABNORMAL LOW (ref >60)
GFR calc non Af Amer: 27 mL/min — ABNORMAL LOW (ref >60)
Glucose, Bld: 241 mg/dL — ABNORMAL HIGH (ref 70–99)
Potassium: 4.4 mmol/L (ref 3.5–5.1)
Sodium: 146 mmol/L — ABNORMAL HIGH (ref 135–145)

## 2020-01-15 LAB — GLUCOSE, CAPILLARY
Glucose-Capillary: 194 mg/dL — ABNORMAL HIGH (ref 70–99)
Glucose-Capillary: 245 mg/dL — ABNORMAL HIGH (ref 70–99)
Glucose-Capillary: 235 mg/dL — ABNORMAL HIGH (ref 70–99)
Glucose-Capillary: 257 mg/dL — ABNORMAL HIGH (ref 70–99)
Glucose-Capillary: 229 mg/dL — ABNORMAL HIGH (ref 70–99)

## 2020-01-15 LAB — CBC
HCT: 26.3 % — ABNORMAL LOW (ref 36.0–46.0)
Hemoglobin: 7.9 g/dL — ABNORMAL LOW (ref 12.0–15.0)
MCH: 26.5 pg (ref 26.0–34.0)
MCHC: 30 g/dL (ref 30.0–36.0)
MCV: 88.3 fL (ref 80.0–100.0)
Platelets: 247 10*3/uL (ref 150–400)
RBC: 2.98 MIL/uL — ABNORMAL LOW (ref 3.87–5.11)
RDW: 24 % — ABNORMAL HIGH (ref 11.5–15.5)
WBC: 7.6 10*3/uL (ref 4.0–10.5)
nRBC: 0.3 % — ABNORMAL HIGH (ref 0.0–0.2)

## 2020-01-15 LAB — MAGNESIUM: Magnesium: 2.2 mg/dL (ref 1.7–2.4)

## 2020-01-15 MED ORDER — GADOBUTROL 1 MMOL/ML IV SOLN
7.5000 mL | Freq: Once | INTRAVENOUS | Status: AC | PRN
  Administered 2020-01-15: 7.5 mL via INTRAVENOUS

## 2020-01-15 NOTE — Procedures (Addendum)
Patient Name: Miranda Roman  MRN: [id number]  Epilepsy Attending: Lora Havens  Referring Physician/Provider: Dr Roland Rack Duration: 01/14/2020 0949 to 01/15/2020 0949  Patient history: 74 y.o. female transferred to [facility] with persistent encephalopathy since June 16. EEG to evaluate for seizure  Level of alertness: Awake, asleep  AEDs during EEG study: LEV  Technical aspects: This EEG study was done with scalp electrodes positioned according to the 10-20 International system of electrode placement. Electrical activity was acquired at a sampling rate of _0  and reviewed with a high frequency filter of _1  and a low frequency filter of _2 . EEG data were recorded continuously and digitally stored.   Description: No clear posterior dominant rhythm was seen. Sleep was characterized by vertex waves, maximal frontocentral region.  EEG showed intermittent generalized 3 to 6 Hz theta-delta slowing. 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 These discharges appeared mostly when patient was stimulated and did not seem to evolve. Hyperventilation and photic stimulation were not performed.    Event button was pressed on 01/14/2020 at 1211 for tensing up per RN. Concomitant eeg showed generalized periodic epileptiform discharges with triphasic morphology at _4  without evolution.  ABNORMALITY -Periodic epileptiform discharges with triphasic morphology, generalized -Intermittent slow, generalized  IMPRESSION: This study showed periodic epileptiform discharges with triphasic morphology mostly seen during stimulation and therefore less likely yo be epileptiform. Additionally, there is evidence of moderate diffuse encephalopathy, non specific to etiology. No seizures  were seen throughout the recording.  Event button was pressed on 01/14/2020 at 1211 as described above without definite eeg change and therefore was not  epileptic.      Miranda Roman [name]

## 2020-01-15 NOTE — Progress Notes (Signed)
LTM maint complete - no skin breakdown under:  Fp2, F8, Ms tech fixed T7

## 2020-01-15 NOTE — Progress Notes (Signed)
PROGRESS NOTE    Miranda Roman  WUJ:[id number] DOB: 07/15/1945 DOA: 01/13/2020 PCP: Glendale Chard, MD   Brief Narrative:  74 year old with history of insulin-dependent diabetes, stage IV CKD, bariatric surgery transferred here from [place].  Initially presented there on 12/21/2019 with chest pain.  VQ scan/Dopplers were negative, cardiac work-up showed moderate pulmonary hypertension.  Hospital course complicated by encephalopathy, leukocytosis and fever.  ID and neurology were consulted, underwent MRI brain and lumbar puncture both were negative.  EEG was unrevealing.  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  ADAM TS 13 was normal.  Bone marrow biopsy performed.  With family's wishes patient was transferred to [facility].   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  Cefepime toxicity?,  Warnicke encephalopathy?.  EEGs in [place] were negative, on Keppra. -LP 6/18-negative for infection -B12, folate, iron studies, MMA-normal -TSH, ammonia-normal -Autoimmune studies--normal -Heavy metal screen-normal -Lyme disease/MAB/arbovirus panel/West Nile-negative -Thiamine, ammonia -Neurology consulted -LTM EEG-no obvious epileptic discharges.  Further management per neurology team. -Continue Keppra 500 mg twice daily IV, family wishes this to be stopped -Neurology considering repeating MRI, LP there after IVIG  Loose stools -As expected from tube feeds, no  evidence of infection or C. difficile  Diabetes mellitus type 2, insulin-dependent -Has insulin pump but currently on Levemir 10 units daily, NovoLog 3 units every 4 hours and insulin sliding scale -Continue Accu-Cheks  Vitamin D deficiency -Vitamin D levels 14.  Getting feeding tube, eventually can be on supplements  Anemia of chronic disease -Previously seen by hematology at [facility].  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  CKD stage IV -Creatinine appears to be around baseline of 2.4.  Today 2.08.  Essential hypertension -Metoprolol 50 mg twice daily  Mild to moderate protein calorie malnutrition/dysphagia -Currently feeding tube in place.  Previously seen by GI, due to history of gastric bypass will need placement of feeding tube by general surgery if proceed that route.  Chest pain, resolved -Echocardiogram showed preserved EF with moderate pulmonary hypertension.  L1-L5 vertebral body/sacral sclerosis -Previously noted by hematology at [place] secondary to sickle cell trait   DVT prophylaxis: Subcu heparin Code Status: Full code Family Communication: Extensive discussion with the patient's daughter at bedside  Status is: Inpatient  Remains inpatient appropriate because:Inpatient level of care appropriate due to severity of illness   Dispo: The patient is from: Home              Anticipated d/c is to: To be determined              Anticipated d/c date is: > 3 days              Patient currently is not medically stable to d/c.  Still unknown etiology of patient's encephalopathy, ongoing work-up including EEG, MRI brain.  Neurology following.  Thereafter will have to establish long-term goals of care and feeding.       Body mass index is 28.[place]  kg/m.  Pressure Injury 01/13/20 Perineum Right;Left Stage 1 -  Intact skin with non-blanchable redness of a localized area usually over a bony prominence. small areas  of skin breakdown between gluteal folds (Active)  01/13/20 1645  Location: Perineum  Location Orientation: Right;Left  Staging: Stage 1 -  Intact skin with non-blanchable redness of a localized area usually over a bony prominence.  Wound Description (Comments): small areas of skin breakdown between gluteal folds  Present on Admission: Yes (from [facility])   Subjective: Patient is awake tracking me across the room but does not participate in any sort of conversation or follow commands.  I even wrote down commands on the dry erase board, she would look at the board but will not execute the actions.  Daughter at bedside as well attempting to communicate with mother but she keeps staring at [place] without any meaningful interaction.  Review of Systems Otherwise negative except as per HPI, including: Difficult to obtain given her mentation  Examination:  Constitutional: Not in acute distress Respiratory: Clear to auscultation bilaterally Cardiovascular: Normal sinus rhythm, no rubs Abdomen: Nontender nondistended good bowel sounds Musculoskeletal: No edema noted Skin: No rashes seen Neurologic: Tracks me across the room but does not follow limited commands.  Overall difficult to assess full neurologic exam. Psychiatric: Difficult to assess  [place]/feeding tube in place  Objective: Vitals:   01/15/20 0736 01/15/20 0810 01/15/20 0817 01/15/20 0837  BP: (!) 185/95 (!) 139/58 137/62 127/63  Pulse: (!) 113   99  Resp: 20 (!) 23 (!) 21 (!) 25  Temp: 99.4 F (37.4 C)   97.9 F (36.6 C)  TempSrc: Axillary   Axillary  SpO2: 99% 99% 98% 100%  Weight:        Intake/Output Summary (Last 24 hours) at 01/15/2020 1031 Last data filed at 01/15/2020 0900 Gross per 24 hour  Intake 1662.08 ml  Output 460 ml  Net 1202.08 ml   Filed Weights   01/14/20 0314 01/15/20 0317  Weight: 78.3 kg 77 kg     Data Reviewed:   CBC: Recent Labs  Lab 01/13/20 1842 01/14/20 0514 01/15/20 0503   WBC 10.3 9.8 7.6  HGB 8.6* 8.2* 7.9*  HCT 27.4* 26.8* 26.3*  MCV 86.2 85.4 88.3  PLT 301 276 093   Basic Metabolic Panel: Recent Labs  Lab 01/13/20 1842 01/14/20 0514 01/15/20 0503  NA  --  144 146*  K  --  3.7 4.4  CL  --  115* 116*  CO2  --  21* 19*  GLUCOSE  --  281* 241*  BUN  --  40* 42*  CREATININE 2.12* 2.08* 1.85*  CALCIUM  --  8.3* 8.3*  MG  --   --  2.2   GFR: Estimated Creatinine Clearance: 28.1 mL/min (A) (by C-G formula based on SCr of 1.85 mg/dL (H)). Liver Function Tests: Recent Labs  Lab 01/14/20 0514  AST 23  ALT 63*  ALKPHOS 72  BILITOT 0.5  PROT 5.4*  ALBUMIN 2.2*   No results for input(s): LIPASE, AMYLASE in the last 168 hours. Recent Labs  Lab 01/14/20 1044  AMMONIA 26   Coagulation Profile: No results for input(s): INR, PROTIME in the last 168 hours. Cardiac Enzymes: No results for input(s): CKTOTAL, CKMB, CKMBINDEX, TROPONINI in the last 168 hours. BNP (last 3 results) No results for input(s): PROBNP in the last 8760 hours. HbA1C: Recent Labs    01/14/20 0514  HGBA1C 7.1*   CBG: Recent Labs  Lab  01/14/20 1622 01/14/20 2007 01/14/20 2347 01/15/20 0329 01/15/20 0741  GLUCAP 220* 227* 208* 194* 245*   Lipid Profile: No results for input(s): CHOL, HDL, LDLCALC, TRIG, CHOLHDL, LDLDIRECT in the last 72 hours. Thyroid Function Tests: No results for input(s): TSH, T4TOTAL, FREET4, T3FREE, THYROIDAB in the last 72 hours. Anemia Panel: No results for input(s): VITAMINB12, FOLATE, FERRITIN, TIBC, IRON, RETICCTPCT in the last 72 hours. Sepsis Labs: No results for input(s): PROCALCITON, LATICACIDVEN in the last 168 hours.  Recent Results (from the past 240 hour(s))  MRSA PCR Screening     Status: None   Collection Time: 01/13/20  8:30 PM   Specimen: Nasal Mucosa; Nasopharyngeal  Result Value Ref Range Status   MRSA by PCR NEGATIVE NEGATIVE Final    Comment:        The GeneXpert MRSA Assay (FDA approved for NASAL  specimens only), is one component of a comprehensive MRSA colonization surveillance program. It is not intended to diagnose MRSA infection nor to guide or monitor treatment for MRSA infections. Performed at [facility], [place] [address], [place], [place] [address]          Radiology Studies: Overnight EEG with video  Result Date: 01/15/2020 Lora Havens, MD     01/15/2020 10:04 AM Patient Name: Miranda Roman MRN: [id number] Epilepsy Attending: Lora Havens Referring Physician/Provider: Dr Roland Rack Duration: 01/14/2020 0949 to 01/15/2020 0949 Patient history: 74 y.o. female transferred to [facility] with persistent encephalopathy since June 16. EEG to evaluate for seizure Level of alertness: Awake, asleep AEDs during EEG study: LEV Technical aspects: This EEG study was done with scalp electrodes positioned according to the 10-20 International system of electrode placement. Electrical activity was acquired at a sampling rate of _0  and reviewed with a high frequency filter of _1  and a low frequency filter of _2 . EEG data were recorded continuously and digitally stored. Description: No clear posterior dominant rhythm was seen. Sleep was characterized by vertex waves, maximal frontocentral region.  EEG showed intermittent generalized 3 to 6 Hz theta-delta slowing. 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 These discharges appeared mostly when patient was stimulated and did not seem to evolve. Hyperventilation and photic stimulation were not performed.  Event button was pressed on 01/14/2020 at 1211 for tensing up per RN. Concomitant eeg showed generalized periodic epileptiform discharges with triphasic morphology at _4  without evolution. 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 Additionally, there is evidence of moderate diffuse encephalopathy, non specific to etiology. No seizures  were seen throughout the recording. Event button was pressed on 01/14/2020 at 1211 as described above without definite eeg change and therefore was not epileptic. Priyanka [name]        Scheduled Meds: . amantadine  100 mg Per Tube Daily  . chlorhexidine  15 mL Mouth Rinse BID  . chlorhexidine  15 mL Mouth Rinse BID  . docusate  100 mg Per Tube BID  . feeding supplement (PROSource TF)  45 mL Per Tube BID  . heparin  5,000 Units Subcutaneous Q8H  . insulin aspart  0-15 Units Subcutaneous Q4H  . insulin aspart  3 Units Subcutaneous Q4H  . insulin detemir  10 Units Subcutaneous QHS  . mouth rinse  15 mL Mouth Rinse q12n4p  . mouth rinse  15 mL Mouth Rinse q12n4p  . metoprolol tartrate  50 mg Per Tube BID  . sennosides  5 mL Per Tube BID  . sodium chloride flush  3 mL Intravenous Q12H  . thiamine injection  100 mg Intravenous Daily   Continuous Infusions: . sodium chloride    . feeding supplement (VITAL 1.5 CAL) 1,000 mL (01/14/20 1425)  . levETIRAcetam 500 mg (01/15/20 0807)     LOS: 2 days   Time spent= 35 mins    Nilton Lave Arsenio Loader, MD [facility]  If 7PM-7AM, please contact night-coverage  01/15/2020, 10:31 AM

## 2020-01-15 NOTE — Progress Notes (Addendum)
Slightly more awake today.     [place] of OSH stay:  She initially went to the outside hospital with chest pain.  She was fine for the first day, but then subsequently developed a fairly significant delirium that lasted for a couple of days.   she had some improvement and was okay for a day or two, but then on day four or five of the hospitalization, her mental status began to go downhill.  She was also thrombocytopenic.  And there was some concern for TTP, she was started on emergent plasma exchange.  ADAMTS13 ended up being negative, but it is noted that HUS was still considered possibility.  She was to the point where she was in the intensive care unit.  It was around that time that she was started on steroids as well as IV thiamine given the concern for her gastric bypass, there was some concern that this might be Wernicke's.  Multiple MRIs were negative.  Per her daughter, she began having improvement around the time that the steroids and thiamine are started.  And she has been having some slow progress since that time.  She was started on keppra empirically, though no definite seizures have been identified.   Interventions attempted PLEX 06/19 IV steroids 07/01 High dose thiamine 500 mg 3 times daily for 3 days  Other work-up included: TSH 1.62 B12 703 Ammonia 34   MRI on 6/17 and Repeat MRI brain without contrast on 7/2 CRP elevated 4.5 07/02 ESR 92 07/02  Copper, slightly high B2 34 Zinc 0.67  Heavy metal screen negative  Lyme disease, MAB negative RPR negative  Paraneoplastic/autoimmune encephalitis panel -negative(SENT AFTER PLEX) ADAMTS13 normal ANCA - negative(before PLEX)  Cortisol 16.6  LP 6/18 CSF WBC 2 CSF RBC 3 CSF glucose 165 CSF protein 67(UL nml 45) West Nile - negative Arbovirus - negative Infectious Encephalitis PCR panel, negative   Routine EEG 6/19 - slowing  Routine EEG /26 : triphasics, no epileptiform activity.   Exam: Vitals:    01/15/20 1136 01/15/20 1536  BP: (!) 143/65 (!) 141/59  Pulse: (!) 101 (!) 113  Resp: 16 19  Temp: 98.4 F (36.9 C) 99.7 F (37.6 C)  SpO2: 99% 100%   Gen: In bed, NAD, NG tube in place.  Resp: non-labored breathing, no acute distress Abd: soft, nt  Neuro: MS: Eyes open she fixates and tracks, follows command to stick out tongue and wiggle toes with significant delay, does not show thumbs.  AC:ZYSAY, blinks to threat, tracks across midline in both directions Motor: she moves all extremities spontaneously, but appears to have some diffuse weakness.  Sensory:responds to stim x 4.   Pertinent Labs: Cr 1.85 Glucose 241 Na 146  Overnight EEG - triphasics  Impression: 74 year old female with relatively abrupt decline in her mental status in conjunction with chest pain, thrombocytopenia.  The abrupt and profound change coupled with normal imaging is fairly unusual.  The fact that she responded to either thiamine or steroids (due to timing unclear which) could be suggestive that this is either Wernicke's or an autoimmune process.  Repeat imaging could be helpful to suggest Wernicke's, as with this profound change, I would expect imaging changes at this point.  She did undergo plasma exchange prior to her autoimmune encephalitis panel which could certainly compromise those results.  Plasma exchange is good at removing serum antibodies, but if there is intrathecal production, there may not be adequate removal. Given how debilitated she still is, I do think  I would favor considering IVIG.  I would like to repeat an LP prior to starting IVIG and sending CSF autoimmune encephalopathy panel given that there may be a higher yield at this time.  If we start IVIG first, then this could confuse the results of that.  1) repeat MRI brain pending 2) consider IVIG and repeat LP. 3) continue keppra  Roland Rack, MD [facility] [phone]  If 7pm- 7am, please page neurology on  call as listed in [place].

## 2020-01-15 NOTE — Progress Notes (Addendum)
Inpatient Diabetes Program Recommendations  AACE/ADA: New Consensus Statement on Inpatient Glycemic Control (2015)  Target Ranges:  Prepandial:   less than 140 mg/dL      Peak postprandial:   less than 180 mg/dL (1-2 hours)      Critically ill patients:  140 - 180 mg/dL   Lab Results  Component Value Date   GLUCAP 229 (H) 01/15/2020   HGBA1C 7.1 (H) 01/14/2020    Review of Glycemic Control  Diabetes history: DM2 Outpatient Diabetes medications: Levemir 5 units bid,  Humalog 0-16 units Q4H, previously 4 units tidwc Current orders for Inpatient glycemic control: Levemir 10 units QHS, Novolog 0-15 units Q4H + 3 units Q4H  HgbA1C - 7.1% Previously on steroids with steroid-induced hyperglycemia TF at 55/h  Inpatient Diabetes Program Recommendations:     Increase Levemir to 12 units QHS Increase Novolog to 4 units Q4H - HOLD if TF held for any reason  Continue to follow.  Thank you. Lorenda Peck, RD, LDN, CDE Inpatient Diabetes Coordinator [phone]

## 2020-01-15 NOTE — Plan of Care (Signed)
  Problem: Pain Managment: Goal: General experience of comfort will improve Outcome: Progressing   Problem: Safety: Goal: Ability to remain free from injury will improve Outcome: Progressing   

## 2020-01-15 NOTE — Progress Notes (Signed)
LTM EEG discontinued - no skin breakdown at unhook.  

## 2020-01-16 ENCOUNTER — Inpatient Hospital Stay (HOSPITAL_COMMUNITY): Payer: Medicare Other

## 2020-01-16 DIAGNOSIS — G9340 Encephalopathy, unspecified: Secondary | ICD-10-CM | POA: Diagnosis not present

## 2020-01-16 DIAGNOSIS — R9401 Abnormal electroencephalogram [EEG]: Secondary | ICD-10-CM

## 2020-01-16 DIAGNOSIS — I10 Essential (primary) hypertension: Secondary | ICD-10-CM | POA: Diagnosis not present

## 2020-01-16 DIAGNOSIS — E6609 Other obesity due to excess calories: Secondary | ICD-10-CM | POA: Diagnosis not present

## 2020-01-16 LAB — BASIC METABOLIC PANEL
Anion gap: 9 (ref 5–15)
BUN: 42 mg/dL — ABNORMAL HIGH (ref 8–23)
CO2: 20 mmol/L — ABNORMAL LOW (ref 22–32)
Calcium: 8.2 mg/dL — ABNORMAL LOW (ref 8.9–10.3)
Chloride: 119 mmol/L — ABNORMAL HIGH (ref 98–111)
Creatinine, Ser: 1.82 mg/dL — ABNORMAL HIGH (ref 0.44–1.00)
GFR calc Af Amer: 32 mL/min — ABNORMAL LOW (ref >60)
GFR calc non Af Amer: 27 mL/min — ABNORMAL LOW (ref >60)
Glucose, Bld: 202 mg/dL — ABNORMAL HIGH (ref 70–99)
Potassium: 4.6 mmol/L (ref 3.5–5.1)
Sodium: 148 mmol/L — ABNORMAL HIGH (ref 135–145)

## 2020-01-16 LAB — GLUCOSE, CAPILLARY
Glucose-Capillary: 118 mg/dL — ABNORMAL HIGH (ref 70–99)
Glucose-Capillary: 158 mg/dL — ABNORMAL HIGH (ref 70–99)
Glucose-Capillary: 174 mg/dL — ABNORMAL HIGH (ref 70–99)
Glucose-Capillary: 181 mg/dL — ABNORMAL HIGH (ref 70–99)
Glucose-Capillary: 186 mg/dL — ABNORMAL HIGH (ref 70–99)
Glucose-Capillary: 99 mg/dL (ref 70–99)

## 2020-01-16 LAB — PROTEIN, CSF: Total  Protein, CSF: 49 mg/dL — ABNORMAL HIGH (ref 15–45)

## 2020-01-16 LAB — CBC
HCT: 25.8 % — ABNORMAL LOW (ref 36.0–46.0)
Hemoglobin: 7.7 g/dL — ABNORMAL LOW (ref 12.0–15.0)
MCH: 26.6 pg (ref 26.0–34.0)
MCHC: 29.8 g/dL — ABNORMAL LOW (ref 30.0–36.0)
MCV: 89 fL (ref 80.0–100.0)
Platelets: 216 10*3/uL (ref 150–400)
RBC: 2.9 MIL/uL — ABNORMAL LOW (ref 3.87–5.11)
RDW: 23.8 % — ABNORMAL HIGH (ref 11.5–15.5)
WBC: 6.5 10*3/uL (ref 4.0–10.5)
nRBC: 0.3 % — ABNORMAL HIGH (ref 0.0–0.2)

## 2020-01-16 LAB — CSF CELL COUNT WITH DIFFERENTIAL
RBC Count, CSF: 0 /mm3
Tube #: 3
WBC, CSF: 0 /mm3 (ref 0–5)

## 2020-01-16 LAB — GLUCOSE, CSF: Glucose, CSF: 124 mg/dL — ABNORMAL HIGH (ref 40–70)

## 2020-01-16 LAB — MAGNESIUM: Magnesium: 2.5 mg/dL — ABNORMAL HIGH (ref 1.7–2.4)

## 2020-01-16 MED ORDER — DEXTROSE 5 % IV SOLN: INTRAVENOUS | Status: DC

## 2020-01-16 MED ORDER — IMMUNE GLOBULIN (HUMAN) 10 GM/100ML IV SOLN
400.0000 mg/kg | INTRAVENOUS | Status: AC
  Administered 2020-01-16 – 2020-01-20 (×5): 30 g via INTRAVENOUS
  Filled 2020-01-16: qty 300
  Filled 2020-01-16 (×2): qty 200
  Filled 2020-01-16: qty 100
  Filled 2020-01-16: qty 200

## 2020-01-16 MED ORDER — INSULIN DETEMIR 100 UNIT/ML ~~LOC~~ SOLN
12.0000 [IU] | Freq: Every day | SUBCUTANEOUS | Status: DC
  Administered 2020-01-17 (×2): 12 [IU] via SUBCUTANEOUS
  Filled 2020-01-16 (×4): qty 0.12

## 2020-01-16 MED ORDER — INSULIN ASPART 100 UNIT/ML ~~LOC~~ SOLN
4.0000 [IU] | SUBCUTANEOUS | Status: AC
  Administered 2020-01-16: 4 [IU] via SUBCUTANEOUS

## 2020-01-16 MED ORDER — LIDOCAINE HCL (PF) 1 % IJ SOLN
5.0000 mL | Freq: Once | INTRAMUSCULAR | Status: AC
  Administered 2020-01-16: 5 mL via INTRADERMAL

## 2020-01-16 NOTE — Progress Notes (Addendum)
CSF   Not suggestive of infection. Protein decreased from prior.  EEG: triphasics   RECS UPDATED -IVIG 2g in divided doses over 5d -c/w Keppra  Will follow  -- Amie Portland, MD [facility] Pager: [phone] If 7pm to 7am, please call on call as listed on [facility].

## 2020-01-16 NOTE — Procedures (Signed)
CLINICAL DATA: Altered mental status  EXAM:  DIAGNOSTIC LUMBAR PUNCTURE UNDER FLUOROSCOPIC GUIDANCE  FLUOROSCOPY TIME: Fluoroscopy Time:  0 minutes, 24 seconds  Radiation Exposure Index (if provided by the fluoroscopic device):  4.8 mGy  Number of Acquired Spot Images: 0  PROCEDURE:  I discussed the risks (including hemorrhage, infection, and nerve damage, among others), benefits, and alternatives to fluoroscopically guided lumbar puncture with patient's daughter in-person, as the patient is unable to consent and has altered mental status.  We specifically discussed the high likelihood of technical success of the procedure.  The patient's daughter understood and elected for the patient to undergo the procedure.   Standard time-out was employed.  Following sterile skin prep and local anesthetic administration consisting of 1 percent lidocaine, a 20 gauge spinal needle was advanced without difficulty into the thecal sac at the L5-S1 level under fluoroscopic guidance.  Clear CSF was returned.  Because of the altered mental status and mild patient agitation, I did not turn her onto her side to obtain a pressure measurement.   A total of 12 cc of clear CSF was collected in 4 vials.  The needle was subsequently removed and the skin cleansed and bandaged.  No immediate complications were observed.   IMPRESSION:  Technically successful fluoroscopically guided lumbar puncture at the L5-S1 level yielding 12 cc of clear CSF.

## 2020-01-16 NOTE — Procedures (Signed)
Patient Name: Miranda Roman  MRN: [id number]  Epilepsy Attending: Lora Havens  Referring Physician/Provider: Dr Roland Rack Duration: 01/15/2020 0949 to 01/15/2020 1650  Patient history: 74 y.o.female transferred to [facility] with persistent encephalopathy since June 16. EEG to evaluate for seizure  Level of alertness: Awake, asleep  AEDs during EEG study: LEV  Technical aspects: This EEG study was done with scalp electrodes positioned according to the 10-20 International system of electrode placement. Electrical activity was acquired at a sampling rate of _0  and reviewed with a high frequency filter of _1  and a low frequency filter of _2 . EEG data were recorded continuously and digitally stored.   Description: No clear posterior dominant rhythm was seen. Sleep was characterized by vertex waves, maximal frontocentral region.  EEG showed intermittent generalized 3 to 6 Hz theta-delta slowing. 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 These discharges appeared mostly when patient was stimulated and did not seem to evolve. Hyperventilation and photic stimulation were not performed.    ABNORMALITY -Periodic epileptiform discharges with triphasic morphology, generalized -Intermittent slow, generalized  IMPRESSION: This study showed periodic epileptiform discharges with triphasic morphology mostly seen during stimulation and therefore less likely yo be epileptiform. Additionally, there is evidence of moderate diffuse encephalopathy, non specific to etiology. No seizures  were seen throughout the recording.  Jessen Siegman [name]

## 2020-01-16 NOTE — Progress Notes (Signed)
Neurology Progress Note   S:// Seen and examined. No acute changes overnight.   O:// Current vital signs: BP 140/76 (BP Location: Right Arm)   Pulse (!) 106   Temp 98.6 F (37 C) (Axillary)   Resp 20   Wt 80.5 kg   SpO2 98%   BMI 29.72 kg/m  Vital signs in last 24 hours: Temp:  [98.4 F (36.9 C)-100.2 F (37.9 C)] 98.6 F (37 C) (07/12 0822) Pulse Rate:  [99-115] 106 (07/12 0822) Resp:  [16-22] 20 (07/12 0822) BP: (140-155)/(59-76) 140/76 (07/12 0822) SpO2:  [98 %-100 %] 98 % (07/12 0822) Weight:  [80.5 kg] 80.5 kg (07/12 0444) Neurological exam Mental status: Opens eyes, fixates and tracks.  Question following commands-I do not know if she really stuck her tongue out to command or was just trying to bite her lips by sticking her tongue out.  She did wiggle toes on the right after significant delay.  Did not give me a thumbs up.  She did localize with both hands when I performed a mild sternal rub. Cranial nerves: Pupils equal round reactive to light, blinks to threat bilaterally, extraocular movements appear intact based on tracking in both directions. Motor exam: She is antigravity in all 4 extremities but cannot keep her arms or legs above the bed for more than a second. Sensory: Responds to noxious simulation in all 4    Medications  Current Facility-Administered Medications:  .  0.9 %  sodium chloride infusion, 250 mL, Intravenous, PRN, Mitzi Hansen, MD, Paused at 01/16/20 0537 .  acetaminophen (TYLENOL) tablet 650 mg, 650 mg, Per Tube, Q6H PRN, 650 mg at 01/16/20 0030 **OR** acetaminophen (TYLENOL) suppository 650 mg, 650 mg, Rectal, Q6H PRN, Pierce, Dwayne A, RPH .  amantadine (SYMMETREL) capsule 100 mg, 100 mg, Per Tube, Daily, Pierce, Dwayne A, RPH, 100 mg at 01/16/20 0947 .  chlorhexidine (PERIDEX) 0.12 % solution 15 mL, 15 mL, Mouth Rinse, BID, Agarwala, Ravi, MD, 15 mL at 01/15/20 0755 .  chlorhexidine (PERIDEX) 0.12 % solution 15 mL, 15 mL, Mouth Rinse,  BID, Agarwala, Ravi, MD, 15 mL at 01/15/20 2353 .  dextrose 5 % solution, , Intravenous, Continuous, Amin, Ankit Chirag, MD, Last Rate: 50 mL/hr at 01/16/20 0900, New Bag at 01/16/20 0900 .  docusate (COLACE) 50 MG/5ML liquid 100 mg, 100 mg, Per Tube, BID, Agarwala, Ravi, MD, 100 mg at 01/15/20 2354 .  feeding supplement (PROSource TF) liquid 45 mL, 45 mL, Per Tube, BID, Amin, Ankit Chirag, MD, 45 mL at 01/16/20 0951 .  feeding supplement (VITAL 1.5 CAL) liquid 1,000 mL, 1,000 mL, Per Tube, Continuous, Amin, Ankit Chirag, MD, Last Rate: 55 mL/hr at 01/16/20 0649, 1,000 mL at 01/16/20 0649 .  heparin injection 5,000 Units, 5,000 Units, Subcutaneous, Q8H, Christian, Rylee, MD, 5,000 Units at 01/16/20 0649 .  hydrALAZINE (APRESOLINE) injection 10-20 mg, 10-20 mg, Intravenous, Q4H PRN, Frederik Pear, MD, 10 mg at 01/15/20 0752 .  insulin aspart (novoLOG) injection 0-15 Units, 0-15 Units, Subcutaneous, Q4H, Frederik Pear, MD, 3 Units at 01/16/20 0851 .  insulin aspart (novoLOG) injection 4 Units, 4 Units, Subcutaneous, Q4H, Amin, Ankit Chirag, MD .  insulin detemir (LEVEMIR) injection 12 Units, 12 Units, Subcutaneous, QHS, Amin, Ankit Chirag, MD .  labetalol (NORMODYNE) injection 10-20 mg, 10-20 mg, Intravenous, Q2H PRN, Ogan, Okoronkwo U, MD, 10 mg at 01/15/20 1743 .  levETIRAcetam (KEPPRA) IVPB 500 mg/100 mL premix, 500 mg, Intravenous, Q12H, Blenda Nicely, [facility], Stopped at 01/16/20 0046 .  MEDLINE mouth rinse, 15 mL, Mouth Rinse, q12n4p, Agarwala, Ravi, MD, 15 mL at 01/15/20 1312 .  MEDLINE mouth rinse, 15 mL, Mouth Rinse, q12n4p, Agarwala, Ravi, MD, 15 mL at 01/15/20 1710 .  metoprolol tartrate (LOPRESSOR) tablet 50 mg, 50 mg, Per Tube, BID, Joselyn Glassman A, RPH, 50 mg at 01/16/20 0947 .  polyethylene glycol (MIRALAX / GLYCOLAX) packet 17 g, 17 g, Per Tube, Daily PRN, Joselyn Glassman A, RPH .  senna-docusate (Senokot-S) tablet 2 tablet, 2 tablet, Per Tube, QHS PRN, Joselyn Glassman A, RPH .   sennosides (SENOKOT) 8.8 MG/5ML syrup 5 mL, 5 mL, Per Tube, BID, Agarwala, Ravi, MD, 5 mL at 01/15/20 2352 .  sodium chloride flush (NS) 0.9 % injection 3 mL, 3 mL, Intravenous, Q12H, Christian, Rylee, MD, 3 mL at 01/15/20 2356 .  sodium chloride flush (NS) 0.9 % injection 3 mL, 3 mL, Intravenous, PRN, Christian, Rylee, MD .  thiamine (B-1) injection 100 mg, 100 mg, Intravenous, Daily, Christian, Rylee, MD, 100 mg at 01/16/20 0948 Labs CBC    Component Value Date/Time   WBC 6.5 01/16/2020 0503   RBC 2.90 (L) 01/16/2020 0503   HGB 7.7 (L) 01/16/2020 0503   HGB 10.8 (L) 09/28/2019 1105   HCT 25.8 (L) 01/16/2020 0503   HCT 36.1 09/28/2019 1105   PLT 216 01/16/2020 0503   PLT 200 09/28/2019 1105   MCV 89.0 01/16/2020 0503   MCV 73 (L) 09/28/2019 1105   MCH 26.6 01/16/2020 0503   MCHC 29.8 (L) 01/16/2020 0503   RDW 23.8 (H) 01/16/2020 0503   RDW 17.1 (H) 09/28/2019 1105   LYMPHSABS 2.0 12/28/2018 1216   MONOABS 0.6 12/28/2018 1216   EOSABS 0.2 12/28/2018 1216   BASOSABS 0.0 12/28/2018 1216    CMP     Component Value Date/Time   NA 148 (H) 01/16/2020 0503   NA 143 09/28/2019 1105   K 4.6 01/16/2020 0503   CL 119 (H) 01/16/2020 0503   CO2 20 (L) 01/16/2020 0503   GLUCOSE 202 (H) 01/16/2020 0503   BUN 42 (H) 01/16/2020 0503   BUN 45 (H) 09/28/2019 1105   CREATININE 1.82 (H) 01/16/2020 0503   CALCIUM 8.2 (L) 01/16/2020 0503   CALCIUM 9.2 04/26/2019 0858   PROT 5.4 (L) 01/14/2020 0514   PROT 6.6 09/28/2019 1105   ALBUMIN 2.2 (L) 01/14/2020 0514   ALBUMIN 4.3 09/28/2019 1105   AST 23 01/14/2020 0514   ALT 63 (H) 01/14/2020 0514   ALKPHOS 72 01/14/2020 0514   BILITOT 0.5 01/14/2020 0514   BILITOT 0.4 09/28/2019 1105   GFRNONAA 27 (L) 01/16/2020 0503   GFRAA 32 (L) 01/16/2020 0503  Repeat MRI brain without contrast on 7/2 Copper, slightly high B2: 34, high CRP elevated 4.5 Zinc normal Heavy metal screen negative Stool occult blood positive APTT elevated Lyme disease,  MAB, arbovirus panel and West Nile all negative  LP was performed by fluoroscopy on 6/18 after initial attempt by neurology at bedside, CSF was not suggestive for infection and showed 67 protein, WBC count of 2 and RBC 3. Meningoencephalitis panel was sent and was negative, viral atypical pneumonia PCR panel also negative including COVID-19   Imaging I have reviewed images in epic and the results pertinent to this consultation are: MRI brain repeated yesterday with no acute changes.  Mild generalized atrophy and mild chronic white matter disease. Overnight EEG yesterday with triphasics  Assessment: 74 year old woman had a relatively abrupt decline in her mentation in conjunction with chest  pain and thrombocytopenia. She did have some improvement with steroids and thiamine suggesting this could be some sort of an autoimmune encephalopathy versus Wernicke's. Initial imaging not suggestive of Wernicke's. LP mild elevated protein at 67.  She has undergone plasma exchange prior to her autoimmune encephalitis panel that was sent and was reported negative but exchange could have led to spurious result.  Plasma exchange is good at removing serum antibodies but if there is any intrathecal production, that might not be adequate removal and given how debilitated she is, Dr. Leonel Ramsay who had been following her recommended IVIG-after repeating a spinal tap and sending CSF for autoimmune encephalopathy panel with the hope that it might have a higher yield at this time.   Recommendations: LP today Will consider IVIG after results become available. Continue Keppra - although no clear seizures Neurology to continue to follow I had a detailed discussion with the daughter at bedside and son [name] who was on speaker phone. Dr. Reesa Chew was also evaluating the patient and I had a brief discussion with him.  -- Amie Portland, MD [facility] Pager: [phone] If 7pm to 7am, please call on call as  listed on AMION.

## 2020-01-16 NOTE — Progress Notes (Addendum)
PROGRESS NOTE    Miranda Roman  SPQ:[id number] DOB: 1945/08/20 DOA: 01/13/2020 PCP: Glendale Chard, MD   Brief Narrative:  74 year old with history of insulin-dependent diabetes, stage IV CKD, bariatric surgery transferred here from [place].  Initially presented there on 12/21/2019 with chest pain.  VQ scan/Dopplers were negative, cardiac work-up showed moderate pulmonary hypertension.  Hospital course complicated by encephalopathy, leukocytosis and fever.  ID and neurology were consulted, underwent MRI brain and lumbar puncture both were negative.  EEG was unrevealing.  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  ADAM TS 13 was normal.  Bone marrow biopsy performed.  With family's wishes patient was transferred to [facility].  Work-up here thus far has been negative including long-term EEG, MRI brain.  Neurology following, plans for fluoroscopy LP and IVIG.   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  Cefepime toxicity?,  Warnicke encephalopathy?.  EEGs in [place] were negative, on Keppra. -LP 6/18-negative for infection -B12, folate, iron studies, MMA-normal -TSH, ammonia-normal -Autoimmune studies--normal -Heavy metal screen-normal -Lyme disease/MAB/arbovirus panel/West Nile-negative -Thiamine, ammonia -LTM EEG-no evidence of seizure. -MRI brain 7/11-negative -Continue Keppra 500 mg IV twice daily -Per neurology-plans for floor LP thereafter IVIG?[name]  Will  need autoimmune lab work to be sent from CSF fluids  Loose stools -Secondary to feeds, no evidence of C. Difficile  Hyponatremia, mild dehydration -D5 water over next 24 hours, monitor labs.  Diabetes mellitus type 2, insulin-dependent -Has insulin pump -Increase Levemir to 12 units and NovoLog to 4 units every 4 hours -Continue Accu-Cheks  Vitamin D deficiency -Vitamin D levels 14.  Getting feeding tube, eventually can be on supplements  Anemia of chronic disease -Previously seen by hematology at [facility]  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  CKD stage IV -Creatinine appears to be around baseline of 2.4.  Today 1.82  Essential hypertension -Metoprolol 50 mg twice daily  Mild to moderate protein calorie malnutrition/dysphagia -Currently feeding tube in place.  Previously seen by GI, due to history of gastric bypass will need placement of J-tube by general surgery if proceed that route.  Chest pain, resolved -Echocardiogram showed preserved EF with moderate pulmonary hypertension.  L1-L5 vertebral body/sacral sclerosis -Previously noted by hematology at [place] secondary to sickle cell trait   DVT prophylaxis: Subcu heparin Code Status: Full code Family Communication: Daughter at bedside  Status is: Inpatient  Remains inpatient appropriate because:Inpatient level of care appropriate due to severity of illness   Dispo: The patient is from: Home              Anticipated d/c is to: To be determined              Anticipated d/c date is: > 3 days              Patient currently is not medically stable to d/c.  Ongoing work-up for her persistent encephalopathy.  Plans for LP followed by IVIG.  Probably expect her to  be here for several days     Body mass index is 29.72 kg/m.  Pressure Injury 01/13/20 Perineum Right;Left Stage 1 -  Intact skin with non-blanchable redness of a localized area usually over a bony  prominence. small areas of skin breakdown between gluteal folds (Active)  01/13/20 1645  Location: Perineum  Location Orientation: Right;Left  Staging: Stage 1 -  Intact skin with non-blanchable redness of a localized area usually over a bony prominence.  Wound Description (Comments): small areas of skin breakdown between gluteal folds  Present on Admission: Yes (from [facility])   Subjective: No acute events overnight, patient able to wake up and tracks across the room but no meaningful interaction.  Does not necessarily follow commands.  Review of Systems Otherwise negative except as per HPI, including: Difficult to obtain due to her mentation  Examination: Constitutional: Not in acute distress Respiratory: Clear to auscultation bilaterally Cardiovascular: Normal sinus rhythm, no rubs Abdomen: Nontender nondistended good bowel sounds Musculoskeletal: No edema noted Skin: No rashes seen Neurologic: Tracks across the room but difficult to have full neuro exam.  Does not follow any commands Psychiatric: Quite difficult to assess  Dobbhoff/feeding tube in place  Objective: Vitals:   01/16/20 0018 01/16/20 0300 01/16/20 0444 01/16/20 0822  BP: (!) 155/76 (!) 147/68  140/76  Pulse: (!) 115 99  (!) 106  Resp: (!) 22 (!) 21  20  Temp: 100.2 F (37.9 C) 98.9 F (37.2 C)  98.6 F (37 C)  TempSrc: Oral Oral  Axillary  SpO2: 100% 100%  98%  Weight:   80.5 kg     Intake/Output Summary (Last 24 hours) at 01/16/2020 1001 Last data filed at 01/16/2020 0600 Gross per 24 hour  Intake 561.77 ml  Output 950 ml  Net -388.23 ml   Filed Weights   01/14/20 0314 01/15/20 0317 01/16/20 0444  Weight: 78.3 kg 77 kg 80.5 kg     Data Reviewed:   CBC: Recent Labs  Lab 01/13/20 1842 01/14/20 0514 01/15/20 0503 01/16/20 0503  WBC 10.3 9.8 7.6 6.5  HGB 8.6* 8.2* 7.9* 7.7*  HCT 27.4* 26.8* 26.3* 25.8*  MCV 86.2 85.4 88.3 89.0  PLT 301 276 247 623   Basic Metabolic  Panel: Recent Labs  Lab 01/13/20 1842 01/14/20 0514 01/15/20 0503 01/16/20 0503  NA  --  144 146* 148*  K  --  3.7 4.4 4.6  CL  --  115* 116* 119*  CO2  --  21* 19* 20*  GLUCOSE  --  281* 241* 202*  BUN  --  40* 42* 42*  CREATININE 2.12* 2.08* 1.85* 1.82*  CALCIUM  --  8.3* 8.3* 8.2*  MG  --   --  2.2 2.5*   GFR: Estimated Creatinine Clearance: 29.2 mL/min (A) (by C-G formula based on SCr of 1.82 mg/dL (H)). Liver Function Tests: Recent Labs  Lab 01/14/20 0514  AST 23  ALT 63*  ALKPHOS 72  BILITOT 0.5  PROT 5.4*  ALBUMIN 2.2*   No results for input(s): LIPASE, AMYLASE in the last 168 hours. Recent Labs  Lab 01/14/20 1044  AMMONIA 26   Coagulation Profile: No results for input(s): INR, PROTIME in the last 168 hours. Cardiac Enzymes: No results for input(s): CKTOTAL, CKMB, CKMBINDEX, TROPONINI in the last 168 hours. BNP (last 3 results) No results for input(s): PROBNP in the last 8760 hours. HbA1C: Recent Labs    01/14/20 0514  HGBA1C 7.1*   CBG: Recent Labs  Lab 01/15/20  1618 01/15/20 2010 01/16/20 0024 01/16/20 0332 01/16/20 0825  GLUCAP 257* 229* 158* 174* 186*   Lipid Profile: No results for input(s): CHOL, HDL, LDLCALC, TRIG, CHOLHDL, LDLDIRECT in the last 72 hours. Thyroid Function Tests: No results for input(s): TSH, T4TOTAL, FREET4, T3FREE, THYROIDAB in the last 72 hours. Anemia Panel: No results for input(s): VITAMINB12, FOLATE, FERRITIN, TIBC, IRON, RETICCTPCT in the last 72 hours. Sepsis Labs: No results for input(s): PROCALCITON, LATICACIDVEN in the last 168 hours.  Recent Results (from the past 240 hour(s))  MRSA PCR Screening     Status: None   Collection Time: 01/13/20  8:30 PM   Specimen: Nasal Mucosa; Nasopharyngeal  Result Value Ref Range Status   MRSA by PCR NEGATIVE NEGATIVE Final    Comment:        The GeneXpert MRSA Assay (FDA approved for NASAL specimens only), is one component of a comprehensive MRSA  colonization surveillance program. It is not intended to diagnose MRSA infection nor to guide or monitor treatment for MRSA infections. Performed at [facility], [place] [address] [place], [place] [address]          Radiology Studies: MR BRAIN W WO CONTRAST  Result Date: 01/15/2020 CLINICAL DATA:  Encephalopathy EXAM: MRI HEAD WITHOUT AND WITH CONTRAST TECHNIQUE: Multiplanar, multiecho pulse sequences of the brain and surrounding structures were obtained without and with intravenous contrast. CONTRAST:  7.67m GADAVIST GADOBUTROL 1 MMOL/ML IV SOLN COMPARISON:  None. FINDINGS: BRAIN: No acute infarct, acute hemorrhage or extra-axial collection. Multifocal white matter hyperintensity, most commonly due to chronic ischemic microangiopathy. Normal volume of CSF spaces. No chronic microhemorrhage. Normal midline structures. There is no abnormal contrast enhancement. VASCULAR: Major flow voids are preserved. SKULL AND UPPER CERVICAL SPINE: Normal calvarium and skull base. Visualized upper cervical spine and soft tissues are normal. SINUSES/ORBITS: Partial left frontal sinus opacification no mastoid effusion normal orbits. IMPRESSION: 1. No acute intracranial abnormality. 2. Mild chronic small vessel disease. Electronically Signed   By: KUlyses JarredM.D.   On: 01/15/2020 22:41   Overnight EEG with video  Result Date: 01/15/2020 YLora Havens MD     01/15/2020 10:04 AM Patient Name: Miranda TIMBERMANMRN: [id number] Attending: PLora HavensReferring Physician/Provider: Dr MRoland RackDuration: 01/14/2020 0949 to 01/15/2020 0949 Patient history: 74[facility]. [facility] EEG to evaluate for seizure Level of alertness: Awake, asleep AEDs during EEG study: LEV Technical aspects: This EEG study was done with scalp electrodes positioned according to the 10-20 International system of electrode placement. Electrical  activity was acquired at a sampling rate of 500Hz and reviewed with a high frequency filter of 70Hz and a low frequency filter of 1Hz. EEG data were recorded continuously and digitally stored. Description: No clear posterior dominant rhythm was seen. Sleep was characterized by vertex waves, maximal frontocentral region.  EEG showed intermittent generalized 3 to 6 Hz theta-delta slowing. Generalized periodic epileptiform discharges with triphasic morphology at 1Twiningwere seen intermittently. These discharges appeared mostly when patient was stimulated and did not seem to evolve. Hyperventilation and photic stimulation were not performed.  Event button was pressed on 01/14/2020 at 1211 for tensing up per RN. Concomitant eeg showed generalized periodic epileptiform discharges with triphasic morphology at 1[place] evolution. ABNORMALITY -Periodic epileptiform discharges with triphasic morphology, generalized -Intermittent slow, generalized IMPRESSION: This study showed periodic epileptiform discharges with triphasic morphology mostly seen during stimulation and therefore less likely yo  be epileptiform. Additionally, there is evidence of moderate diffuse encephalopathy, non specific to etiology. No seizures  were seen throughout the recording. Event button was pressed on 01/14/2020 at 1211 as described above without definite eeg change and therefore was not epileptic. Priyanka [name]        Scheduled Meds: . amantadine  100 mg Per Tube Daily  . chlorhexidine  15 mL Mouth Rinse BID  . chlorhexidine  15 mL Mouth Rinse BID  . docusate  100 mg Per Tube BID  . feeding supplement (PROSource TF)  45 mL Per Tube BID  . heparin  5,000 Units Subcutaneous Q8H  . insulin aspart  0-15 Units Subcutaneous Q4H  . insulin aspart  4 Units Subcutaneous Q4H  . insulin detemir  12 Units Subcutaneous QHS  . mouth rinse  15 mL Mouth Rinse q12n4p  . mouth rinse  15 mL Mouth Rinse q12n4p  . metoprolol tartrate  50 mg Per Tube  BID  . sennosides  5 mL Per Tube BID  . sodium chloride flush  3 mL Intravenous Q12H  . thiamine injection  100 mg Intravenous Daily   Continuous Infusions: . sodium chloride Stopped (01/16/20 0537)  . dextrose 50 mL/hr at 01/16/20 0900  . feeding supplement (VITAL 1.5 CAL) 1,000 mL (01/16/20 0649)  . levETIRAcetam Stopped (01/16/20 0046)     LOS: 3 days   Time spent= 35 mins     Arsenio Loader, MD [facility]  If 7PM-7AM, please contact night-coverage  01/16/2020, 10:01 AM

## 2020-01-17 DIAGNOSIS — E6609 Other obesity due to excess calories: Secondary | ICD-10-CM | POA: Diagnosis not present

## 2020-01-17 DIAGNOSIS — I10 Essential (primary) hypertension: Secondary | ICD-10-CM | POA: Diagnosis not present

## 2020-01-17 DIAGNOSIS — G9340 Encephalopathy, unspecified: Secondary | ICD-10-CM | POA: Diagnosis not present

## 2020-01-17 LAB — CBC
HCT: 22.7 % — ABNORMAL LOW (ref 36.0–46.0)
Hemoglobin: 6.9 g/dL — CL (ref 12.0–15.0)
MCH: 27 pg (ref 26.0–34.0)
MCHC: 30.4 g/dL (ref 30.0–36.0)
MCV: 88.7 fL (ref 80.0–100.0)
Platelets: 171 10*3/uL (ref 150–400)
RBC: 2.56 MIL/uL — ABNORMAL LOW (ref 3.87–5.11)
RDW: 22.9 % — ABNORMAL HIGH (ref 11.5–15.5)
WBC: 6.7 10*3/uL (ref 4.0–10.5)
nRBC: 0.3 % — ABNORMAL HIGH (ref 0.0–0.2)

## 2020-01-17 LAB — GLUCOSE, CAPILLARY
Glucose-Capillary: 232 mg/dL — ABNORMAL HIGH (ref 70–99)
Glucose-Capillary: 194 mg/dL — ABNORMAL HIGH (ref 70–99)
Glucose-Capillary: 130 mg/dL — ABNORMAL HIGH (ref 70–99)
Glucose-Capillary: 120 mg/dL — ABNORMAL HIGH (ref 70–99)
Glucose-Capillary: 115 mg/dL — ABNORMAL HIGH (ref 70–99)
Glucose-Capillary: 121 mg/dL — ABNORMAL HIGH (ref 70–99)

## 2020-01-17 LAB — BASIC METABOLIC PANEL
Anion gap: 10 (ref 5–15)
BUN: 44 mg/dL — ABNORMAL HIGH (ref 8–23)
CO2: 20 mmol/L — ABNORMAL LOW (ref 22–32)
Calcium: 8.2 mg/dL — ABNORMAL LOW (ref 8.9–10.3)
Chloride: 112 mmol/L — ABNORMAL HIGH (ref 98–111)
Creatinine, Ser: 1.92 mg/dL — ABNORMAL HIGH (ref 0.44–1.00)
GFR calc Af Amer: 30 mL/min — ABNORMAL LOW (ref >60)
GFR calc non Af Amer: 26 mL/min — ABNORMAL LOW (ref >60)
Glucose, Bld: 251 mg/dL — ABNORMAL HIGH (ref 70–99)
Potassium: 4.7 mmol/L (ref 3.5–5.1)
Sodium: 142 mmol/L (ref 135–145)

## 2020-01-17 LAB — MAGNESIUM: Magnesium: 2.1 mg/dL (ref 1.7–2.4)

## 2020-01-17 LAB — HEMOGLOBIN AND HEMATOCRIT, BLOOD
HCT: 30.4 % — ABNORMAL LOW (ref 36.0–46.0)
Hemoglobin: 9.5 g/dL — ABNORMAL LOW (ref 12.0–15.0)

## 2020-01-17 LAB — VITAMIN B1: Vitamin B1 (Thiamine): 403.4 nmol/L — ABNORMAL HIGH (ref 66.5–200.0)

## 2020-01-17 LAB — PREPARE RBC (CROSSMATCH)

## 2020-01-17 MED ORDER — LIDOCAINE 5 % EX PTCH
1.0000 | MEDICATED_PATCH | CUTANEOUS | Status: DC
  Administered 2020-01-17 – 2020-02-15 (×29): 1 via TRANSDERMAL
  Filled 2020-01-17 (×30): qty 1

## 2020-01-17 MED ORDER — SODIUM CHLORIDE 0.9% IV SOLUTION: Freq: Once | INTRAVENOUS | Status: AC

## 2020-01-17 MED ORDER — DEXTROSE-NACL 5-0.45 % IV SOLN: INTRAVENOUS | Status: DC

## 2020-01-17 NOTE — Progress Notes (Signed)
Pt pulled out the cord track early this morning!

## 2020-01-17 NOTE — Progress Notes (Signed)
PROGRESS NOTE    Miranda Roman  EHM:[id number] DOB: September 01, 1945 DOA: 01/13/2020 PCP: Glendale Chard, MD   Brief Narrative:  74 year old with history of insulin-dependent diabetes, stage IV CKD, bariatric surgery transferred here from [place].  Initially presented there on 12/21/2019 with chest pain.  VQ scan/Dopplers were negative, cardiac work-up showed moderate pulmonary hypertension.  Hospital course complicated by encephalopathy, leukocytosis and fever.  ID and neurology were consulted, underwent MRI brain and lumbar puncture both were negative.  EEG was unrevealing.  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  ADAM TS 13 was normal.  Bone marrow biopsy performed.  With family's wishes patient was transferred to [facility].  Work-up here thus far has been negative including long-term EEG, MRI brain, repeat LP-negative.  Started IVIG for 5 days.   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  Cefepime toxicity?,  Warnicke encephalopathy?.  EEGs in [place] were negative, on Keppra. -LP 6/18-negative for infection -B12, folate, iron studies, MMA-normal -TSH, ammonia-normal -Autoimmune studies--normal -Heavy metal screen-normal -Lyme disease/MAB/arbovirus panel/West Nile-negative -Thiamine, ammonia -LTM EEG-no evidence of seizure. -MRI brain 7/11-negative -Continue Keppra 500 mg IV twice daily -Repeat LP 7/12-negative.  IVIG started 7/12 X 5 days -Neurology  following  Loose stools -Secondary to feeds, no evidence of C. Difficile  Hyponatremia, mild dehydration -Improved transition to D5 half-normal saline at 75 cc/h  Diabetes mellitus type 2, insulin-dependent -Has insulin pump -Continue Levemir to 12 units and NovoLog to 4 units every 4 hours -Continue Accu-Cheks  Vitamin D deficiency -Vitamin D levels 14.  Getting feeding tube, eventually can be on supplements  Anemia of chronic disease -Previously seen by hematology at [facility].  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 -Hemoglobin dropped to 6.9 this morning, all cell lines dropped suspect dilution no obvious evidence of bleeding.  1 unit PRBC transfusion.  CKD stage IV -Creatinine appears to be around baseline of 2.4.  Today 1.92  Essential hypertension -Metoprolol 50 mg twice daily  Mild to moderate protein calorie malnutrition/dysphagia -Patient pulled a core track.  Will need to be replaced.  Patient not a candidate for PEG tube, she will eventually require a J-tube by general surgery because of her gastric bypass anatomy  Chest pain, resolved -Echocardiogram showed preserved EF with moderate pulmonary hypertension.  L1-L5 vertebral body/sacral sclerosis -Previously noted by hematology at [place] secondary to sickle cell trait   DVT prophylaxis: Subcu heparin Code Status: Full code Family Communication: Daughter at bedside  Status is: Inpatient  Remains inpatient appropriate because:Inpatient level of care appropriate due to severity of illness   Dispo: The patient is from: Home              Anticipated d/c is to: To be determined              Anticipated d/c date is: > 3 days              Patient currently is not medically stable  to d/c.  Ongoing work-up for persistent encephalopathy.  Now on IVIG.  Will remain in the hospital for at least next 5 to 7 days   Body mass index is 29.72 kg/m.  Pressure  Injury 01/13/20 Perineum Right;Left Stage 1 -  Intact skin with non-blanchable redness of a localized area usually over a bony prominence. small areas of skin breakdown between gluteal folds (Active)  01/13/20 1645  Location: Perineum  Location Orientation: Right;Left  Staging: Stage 1 -  Intact skin with non-blanchable redness of a localized area usually over a bony prominence.  Wound Description (Comments): small areas of skin breakdown between gluteal folds  Present on Admission: Yes (from [facility])   Subjective: Patient pulled out her feeding tube overnight, no acute events otherwise.  Mentation still remains the same.  Review of Systems Otherwise negative except as per HPI, including: Unable to obtain.    Examination: Constitutional: Not in acute distress Respiratory: Clear to auscultation bilaterally Cardiovascular: Normal sinus rhythm, no rubs Abdomen: Nontender nondistended good bowel sounds Musculoskeletal: No edema noted Skin: No rashes seen Neurologic: Not following commands, only tracks me across the room.  Spontaneously moves her extremities on her own.  Does not follow any commands. Psychiatric: Unable to assess  Dobbhoff/feeding tube in place  Objective: Vitals:   01/16/20 2251 01/17/20 0436 01/17/20 0754 01/17/20 0945  BP: (!) 146/68 116/63 (!) 152/82 140/78  Pulse: (!) 107 94 98 91  Resp: _0 Temp: 99.3 F (37.4 C) 99.7 F (37.6 C) 98.3 F (36.8 C) 98 F (36.7 C)  TempSrc: Oral Oral Oral Axillary  SpO2: 100% 100% 100% 100%  Weight:        Intake/Output Summary (Last 24 hours) at 01/17/2020 1019 Last data filed at 01/17/2020 0511 Gross per 24 hour  Intake 391.87 ml  Output 400 ml  Net -8.13 ml   Filed Weights   01/14/20 0314 01/15/20 0317 01/16/20 0444  Weight: 78.3 kg 77 kg 80.5 kg     Data Reviewed:   CBC: Recent Labs  Lab 01/13/20 1842 01/14/20 0514 01/15/20 0503 01/16/20 0503 01/17/20 0407  WBC 10.3 9.8 7.6 6.5 6.7   HGB 8.6* 8.2* 7.9* 7.7* 6.9*  HCT 27.4* 26.8* 26.3* 25.8* 22.7*  MCV 86.2 85.4 88.3 89.0 88.7  PLT 301 276 247 216 382   Basic Metabolic Panel: Recent Labs  Lab 01/13/20 1842 01/14/20 0514 01/15/20 0503 01/16/20 0503 01/17/20 0407  NA  --  144 146* 148* 142  K  --  3.7 4.4 4.6 4.7  CL  --  115* 116* 119* 112*  CO2  --  21* 19* 20* 20*  GLUCOSE  --  281* 241* 202* 251*  BUN  --  40* 42* 42* 44*  CREATININE 2.12* 2.08* 1.85* 1.82* 1.92*  CALCIUM  --  8.3* 8.3* 8.2* 8.2*  MG  --   --  2.2 2.5* 2.1   GFR: Estimated Creatinine Clearance: 27.6 mL/min (A) (by C-G formula based on SCr of 1.92 mg/dL (H)). Liver Function Tests: Recent Labs  Lab 01/14/20 0514  AST 23  ALT 63*  ALKPHOS 72  BILITOT 0.5  PROT 5.4*  ALBUMIN 2.2*   No results for input(s): LIPASE, AMYLASE in the last 168 hours. Recent Labs  Lab 01/14/20 1044  AMMONIA 26   Coagulation Profile: No results for input(s): INR, PROTIME in the last 168 hours. Cardiac Enzymes: No results for input(s): CKTOTAL, CKMB, CKMBINDEX, TROPONINI in the last 168 hours. BNP (  last 3 results) No results for input(s): PROBNP in the last 8760 hours. HbA1C: No results for input(s): HGBA1C in the last 72 hours. CBG: Recent Labs  Lab 01/16/20 1732 01/16/20 2101 01/17/20 0135 01/17/20 0456 01/17/20 0756  GLUCAP 99 118* 232* 194* 130*   Lipid Profile: No results for input(s): CHOL, HDL, LDLCALC, TRIG, CHOLHDL, LDLDIRECT in the last 72 hours. Thyroid Function Tests: No results for input(s): TSH, T4TOTAL, FREET4, T3FREE, THYROIDAB in the last 72 hours. Anemia Panel: No results for input(s): VITAMINB12, FOLATE, FERRITIN, TIBC, IRON, RETICCTPCT in the last 72 hours. Sepsis Labs: No results for input(s): PROCALCITON, LATICACIDVEN in the last 168 hours.  Recent Results (from the past 240 hour(s))  MRSA PCR Screening     Status: None   Collection Time: 01/13/20  8:30 PM   Specimen: Nasal Mucosa; Nasopharyngeal  Result Value  Ref Range Status   MRSA by PCR NEGATIVE NEGATIVE Final    Comment:        The GeneXpert MRSA Assay (FDA approved for NASAL specimens only), is one component of a comprehensive MRSA colonization surveillance program. It is not intended to diagnose MRSA infection nor to guide or monitor treatment for MRSA infections. Performed at [facility], [place] [address] [place], [place] [address]   CSF culture     Status: None (Preliminary result)   Collection Time: 01/16/20 12:14 PM   Specimen: PATH Cytology CSF; Cerebrospinal Fluid  Result Value Ref Range Status   Specimen Description CSF  Final   Special Requests NONE  Final   Gram Stain   Final    WBC PRESENT,BOTH PMN AND MONONUCLEAR NO ORGANISMS SEEN CYTOSPIN SMEAR    Culture   Final    NO GROWTH < 24 HOURS Performed at [facility], [place] [address], [place], [place] [address]    Report Status PENDING  Incomplete         Radiology Studies: MR BRAIN W WO CONTRAST  Result Date: 01/15/2020 CLINICAL DATA:  Encephalopathy EXAM: MRI HEAD WITHOUT AND WITH CONTRAST TECHNIQUE: Multiplanar, multiecho pulse sequences of the brain and surrounding structures were obtained without and with intravenous contrast. CONTRAST:  7.65m GADAVIST GADOBUTROL 1 MMOL/ML IV SOLN COMPARISON:  None. FINDINGS: BRAIN: No acute infarct, acute hemorrhage or extra-axial collection. Multifocal white matter hyperintensity, most commonly due to chronic ischemic microangiopathy. Normal volume of CSF spaces. No chronic microhemorrhage. Normal midline structures. There is no abnormal contrast enhancement. VASCULAR: Major flow voids are preserved. SKULL AND UPPER CERVICAL SPINE: Normal calvarium and skull base. Visualized upper cervical spine and soft tissues are normal. SINUSES/ORBITS: Partial left frontal sinus opacification no mastoid effusion normal orbits. IMPRESSION: 1. No acute intracranial abnormality. 2. Mild chronic small vessel disease. Electronically  Signed   By: KUlyses JarredM.D.   On: 01/15/2020 22:41   DG Abd Portable 1V  Result Date: 01/16/2020 CLINICAL DATA:  Enteric catheter placement EXAM: PORTABLE ABDOMEN - 1 VIEW COMPARISON:  None. FINDINGS: Two frontal views of the abdomen and pelvis are obtained, excluding the hemidiaphragms and right flank by collimation. The tip of an enteric feeding catheter overlies the gastric antrum. Bowel gas pattern is unremarkable. IMPRESSION: 1. Enteric catheter tip projects over gastric antrum. Electronically Signed   By: MRanda NgoM.D.   On: 01/16/2020 21:10   DG FL GUIDED LUMBAR PUNCTURE  Result Date: 01/16/2020 CLINICAL DATA:  Altered mental status EXAM: DIAGNOSTIC LUMBAR PUNCTURE UNDER FLUOROSCOPIC GUIDANCE FLUOROSCOPY TIME:  Fluoroscopy Time:  0 minutes, 24  seconds Radiation Exposure Index (if provided by the fluoroscopic device): 4.8 mGy Number of Acquired Spot Images: 0 PROCEDURE: I discussed the risks (including hemorrhage, infection, and nerve damage, among others), benefits, and alternatives to fluoroscopically guided lumbar puncture with patient's daughter in-person, as the patient is unable to consent and has altered mental status. We specifically discussed the high likelihood of technical success of the procedure. The patient's daughter understood and elected for the patient to undergo the procedure. Standard time-out was employed. Following sterile skin prep and local anesthetic administration consisting of 1 percent lidocaine, a 20 gauge spinal needle was advanced without difficulty into the thecal sac at the L5-S1 level under fluoroscopic guidance. Clear CSF was returned. Because of the altered mental status and mild patient agitation, I did not turn her onto her side to obtain a pressure measurement. A total of 12 cc of clear CSF was collected in 4 vials. The needle was subsequently removed and the skin cleansed and bandaged. No immediate complications were observed. IMPRESSION: 1. Technically  successful fluoroscopically guided lumbar puncture at the L5-S1 level yielding 12 cc of clear CSF. Electronically Signed   By: Van Clines M.D.   On: 01/16/2020 12:56        Scheduled Meds: . amantadine  100 mg Per Tube Daily  . chlorhexidine  15 mL Mouth Rinse BID  . chlorhexidine  15 mL Mouth Rinse BID  . docusate  100 mg Per Tube BID  . feeding supplement (PROSource TF)  45 mL Per Tube BID  . heparin  5,000 Units Subcutaneous Q8H  . insulin aspart  0-15 Units Subcutaneous Q4H  . insulin detemir  12 Units Subcutaneous QHS  . mouth rinse  15 mL Mouth Rinse q12n4p  . mouth rinse  15 mL Mouth Rinse q12n4p  . metoprolol tartrate  50 mg Per Tube BID  . sennosides  5 mL Per Tube BID  . sodium chloride flush  3 mL Intravenous Q12H  . thiamine injection  100 mg Intravenous Daily   Continuous Infusions: . sodium chloride Stopped (01/17/20 0221)  . feeding supplement (VITAL 1.5 CAL) Stopped (01/16/20 0955)  . Immune Globulin 10%    . levETIRAcetam Stopped (01/17/20 0137)     LOS: 4 days   Time spent= 35 mins    Arista Kettlewell Arsenio Loader, MD [facility]  If 7PM-7AM, please contact night-coverage  01/17/2020, 10:19 AM

## 2020-01-17 NOTE — Progress Notes (Signed)
Physical Therapy Treatment Patient Details Name: Miranda Roman MRN: [id number] DOB: December 21, 1945 Today's Date: 01/17/2020    History of Present Illness Pateint is a 74 year old female. 3 weeks prior she injured her foot tubing on vacation. The next day she began to have chest pain and quickly developed acute encephalopathy. She was flown from [place] (on vacation) to [place] where she lives. 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 Alert entire session, tracking therapist, spontaneously moving all extremities (weakly), and did not follow any commands. Would like to see patient with additional staff member assistance to sit up at EOB and assess her abilities.     Follow Up Recommendations  LTACH;SNF     Equipment Recommendations  None recommended by PT    Recommendations for Other Services       Precautions / Restrictions Precautions Precautions: None    Mobility  Bed Mobility Overal bed mobility: Needs Assistance Bed Mobility: Rolling Rolling: Total assist;+2 for physical assistance         General bed mobility comments: rolled rt and left for stimulation with pt unable to assist  Transfers                    Ambulation/Gait                 Stairs             Wheelchair Mobility    Modified Rankin (Stroke Patients Only)       Balance                                            Cognition Arousal/Alertness: Awake/alert Behavior During Therapy: Flat affect Overall Cognitive Status: Impaired/Different from baseline                                 General Comments: eyes open entire session; very little change in facial expression except when in pain; tracking therapist consistently; eyes turn to find PT each time her name is called; moving all 4's spontaneously in small  ROM      Exercises Other Exercises Other Exercises: PROM perfromed on all major joints. RUE no limitations; RLE no limitations; LUE +flexor tone with tightness in biceps with quick stretch; incr pain with full left hip flexion--tolerated smaller ROM hip flexion, hip internal and external rotation, abduction/adduction.     General Comments General comments (skin integrity, edema, etc.): Daughter and sister present. Daughter reports she has mostly worked on keeping her moms ankles loose (as previous PT explained) and also moves UEs in PROM. She had no questions re: PROM      Pertinent Vitals/Pain Pain Assessment: Faces Faces Pain Scale: Hurts whole lot Pain Location: left hip with endrange flexion Pain Descriptors / Indicators: Grimacing;Moaning Pain Intervention(s): Limited activity within patient's tolerance    Home Living Family/patient expects to be discharged to:: Private residence Living Arrangements: Alone Available Help at Discharge: Family Type of Home: House Home Access: Stairs to enter   Home Layout: One level Home Equipment: Environmental consultant - 2 wheels      Prior Function Level of Independence: Independent      Comments: Per daughter patient was very active.  Patient unable to answer questions    PT Goals (current goals can now be found in the care plan section) Acute Rehab PT Goals Patient Stated Goal: unable to state  PT Goal Formulation: With family Time For Goal Achievement: 01/21/20 Progress towards PT goals: Progressing toward goals    Frequency    Min 2X/week      PT Plan Current plan remains appropriate    Co-evaluation              AM-PAC PT "6 Clicks" Mobility   Outcome Measure  Help needed turning from your back to your side while in a flat bed without using bedrails?: Total Help needed moving from lying on your back to sitting on the side of a flat bed without using bedrails?: Total Help needed moving to and from a bed to a chair (including a  wheelchair)?: Total Help needed standing up from a chair using your arms (e.g., wheelchair or bedside chair)?: Total Help needed to walk in hospital room?: Total Help needed climbing 3-5 steps with a railing? : Total 6 Click Score: 6    End of Session   Activity Tolerance: Treatment limited secondary to medical complications (Comment) (will need +2 for sitting EOB ) Patient left: with bed alarm set;with family/visitor present Nurse Communication: Other (comment) (needed to be cleaned and linens changed due to incontinence) PT Visit Diagnosis: Other abnormalities of gait and mobility (R26.89);Muscle weakness (generalized) (M62.81)     Time: 1859-0931 PT Time Calculation (min) (ACUTE ONLY): 38 min  Charges:  $Therapeutic Exercise: 23-37 mins $Therapeutic Activity: 8-22 mins                      Arby Barrette, PT Pager [phone]    Rexanne Mano 01/17/2020, 5:45 PM

## 2020-01-17 NOTE — Progress Notes (Signed)
CRITICAL VALUE ALERT  Critical Value: hemoglobin = 6.9  Date & Time Notied: 01/17/2020 at 0523  Provider Notified: MD T. Opyd   Orders Received/Actions taken:

## 2020-01-17 NOTE — Progress Notes (Signed)
Neurology Progress Note   S:// Patient seen and examined.  Pulled her core track early this morning. Morning labs resulted-hemoglobin 6.9. Daughter at bedside.  Reports no big changes.  Still not talking or following commands.  O:// Current vital signs: BP (!) 152/82 (BP Location: Right Arm)   Pulse 98   Temp 98.3 F (36.8 C) (Oral)   Resp 16   Wt 80.5 kg   SpO2 100%   BMI 29.72 kg/m  Vital signs in last 24 hours: Temp:  [98.3 F (36.8 C)-99.8 F (37.7 C)] 98.3 F (36.8 C) (07/13 0754) Pulse Rate:  [94-109] 98 (07/13 0754) Resp:  [16-20] 16 (07/13 0754) BP: (116-152)/(63-82) 152/82 (07/13 0754) SpO2:  [98 %-100 %] 100 % (07/13 0754) Neurologic exam Mental status: She is awake today, tracks and fixates.  I question if she is following commands-again attempted to stick her tongue out and wiggle more on the right dose than left after significant delay.  Did not give me a thumbs up.  Did localize with both hands with mild sternal rub. Current nerves: Pupils equal round react light, blinks to threat bilaterally, extraocular movements appear intact, face appears symmetric. Motor exam: She is appearing to be spontaneously moving more of her left arm and right leg then the other 2. Sensory; response to noxious stimulation in all fours Coordination difficult to assess due to her mentation  Medications  Current Facility-Administered Medications:  .  0.9 %  sodium chloride infusion, 250 mL, Intravenous, PRN, Mitzi Hansen, MD, Stopped at 01/17/20 0221 .  acetaminophen (TYLENOL) tablet 650 mg, 650 mg, Per Tube, Q6H PRN, 650 mg at 01/17/20 0053 **OR** acetaminophen (TYLENOL) suppository 650 mg, 650 mg, Rectal, Q6H PRN, Pierce, Dwayne A, RPH .  amantadine (SYMMETREL) capsule 100 mg, 100 mg, Per Tube, Daily, Pierce, Dwayne A, RPH, 100 mg at 01/16/20 0947 .  chlorhexidine (PERIDEX) 0.12 % solution 15 mL, 15 mL, Mouth Rinse, BID, Agarwala, Ravi, MD, 15 mL at 01/17/20 0042 .  chlorhexidine  (PERIDEX) 0.12 % solution 15 mL, 15 mL, Mouth Rinse, BID, Agarwala, Ravi, MD, 15 mL at 01/15/20 2353 .  dextrose 5 % solution, , Intravenous, Continuous, Amin, Ankit Chirag, MD, Last Rate: 50 mL/hr at 01/17/20 0511, Rate Verify at 01/17/20 0511 .  docusate (COLACE) 50 MG/5ML liquid 100 mg, 100 mg, Per Tube, BID, Agarwala, Ravi, MD, 100 mg at 01/17/20 0053 .  feeding supplement (PROSource TF) liquid 45 mL, 45 mL, Per Tube, BID, Amin, Ankit Chirag, MD, 45 mL at 01/17/20 0044 .  feeding supplement (VITAL 1.5 CAL) liquid 1,000 mL, 1,000 mL, Per Tube, Continuous, Amin, Jeanella Flattery, MD, Stopped at 01/16/20 0955 .  heparin injection 5,000 Units, 5,000 Units, Subcutaneous, Q8H, Christian, Rylee, MD, 5,000 Units at 01/17/20 0503 .  hydrALAZINE (APRESOLINE) injection 10-20 mg, 10-20 mg, Intravenous, Q4H PRN, Frederik Pear, MD, 10 mg at 01/15/20 0752 .  Immune Globulin 10% (PRIVIGEN) IV infusion 30 g, 400 mg/kg, Intravenous, Q24 Hr x 5, Amie Portland, MD, 30 g at 01/16/20 2135 .  insulin aspart (novoLOG) injection 0-15 Units, 0-15 Units, Subcutaneous, Q4H, Frederik Pear, MD, 3 Units at 01/17/20 0502 .  insulin detemir (LEVEMIR) injection 12 Units, 12 Units, Subcutaneous, QHS, Amin, Ankit Chirag, MD, 12 Units at 01/17/20 0037 .  labetalol (NORMODYNE) injection 10-20 mg, 10-20 mg, Intravenous, Q2H PRN, Frederik Pear, MD, 20 mg at 01/17/20 0054 .  levETIRAcetam (KEPPRA) IVPB 500 mg/100 mL premix, 500 mg, Intravenous, Q12H, Blenda Nicely, RPH,  Stopped at 01/17/20 0137 .  MEDLINE mouth rinse, 15 mL, Mouth Rinse, q12n4p, Agarwala, Ravi, MD, 15 mL at 01/16/20 1654 .  MEDLINE mouth rinse, 15 mL, Mouth Rinse, q12n4p, Agarwala, Ravi, MD, 15 mL at 01/16/20 1654 .  metoprolol tartrate (LOPRESSOR) tablet 50 mg, 50 mg, Per Tube, BID, Joselyn Glassman A, RPH, 50 mg at 01/17/20 0045 .  polyethylene glycol (MIRALAX / GLYCOLAX) packet 17 g, 17 g, Per Tube, Daily PRN, Joselyn Glassman A, RPH .  senna-docusate  (Senokot-S) tablet 2 tablet, 2 tablet, Per Tube, QHS PRN, Joselyn Glassman A, RPH .  sennosides (SENOKOT) 8.8 MG/5ML syrup 5 mL, 5 mL, Per Tube, BID, Agarwala, Ravi, MD, 5 mL at 01/17/20 0044 .  sodium chloride flush (NS) 0.9 % injection 3 mL, 3 mL, Intravenous, Q12H, Christian, Rylee, MD, 3 mL at 01/17/20 0116 .  sodium chloride flush (NS) 0.9 % injection 3 mL, 3 mL, Intravenous, PRN, Christian, Rylee, MD .  thiamine (B-1) injection 100 mg, 100 mg, Intravenous, Daily, Christian, Rylee, MD, 100 mg at 01/16/20 0948 Labs CBC    Component Value Date/Time   WBC 6.7 01/17/2020 0407   RBC 2.56 (L) 01/17/2020 0407   HGB 6.9 (LL) 01/17/2020 0407   HGB 10.8 (L) 09/28/2019 1105   HCT 22.7 (L) 01/17/2020 0407   HCT 36.1 09/28/2019 1105   PLT 171 01/17/2020 0407   PLT 200 09/28/2019 1105   MCV 88.7 01/17/2020 0407   MCV 73 (L) 09/28/2019 1105   MCH 27.0 01/17/2020 0407   MCHC 30.4 01/17/2020 0407   RDW 22.9 (H) 01/17/2020 0407   RDW 17.1 (H) 09/28/2019 1105   LYMPHSABS 2.0 12/28/2018 1216   MONOABS 0.6 12/28/2018 1216   EOSABS 0.2 12/28/2018 1216   BASOSABS 0.0 12/28/2018 1216    CMP     Component Value Date/Time   NA 142 01/17/2020 0407   NA 143 09/28/2019 1105   K 4.7 01/17/2020 0407   CL 112 (H) 01/17/2020 0407   CO2 20 (L) 01/17/2020 0407   GLUCOSE 251 (H) 01/17/2020 0407   BUN 44 (H) 01/17/2020 0407   BUN 45 (H) 09/28/2019 1105   CREATININE 1.92 (H) 01/17/2020 0407   CALCIUM 8.2 (L) 01/17/2020 0407   CALCIUM 9.2 04/26/2019 0858   PROT 5.4 (L) 01/14/2020 0514   PROT 6.6 09/28/2019 1105   ALBUMIN 2.2 (L) 01/14/2020 0514   ALBUMIN 4.3 09/28/2019 1105   AST 23 01/14/2020 0514   ALT 63 (H) 01/14/2020 0514   ALKPHOS 72 01/14/2020 0514   BILITOT 0.5 01/14/2020 0514   BILITOT 0.4 09/28/2019 1105   GFRNONAA 26 (L) 01/17/2020 0407   GFRAA 30 (L) 01/17/2020 0407   LP results as noted in the last note.  Imaging I have reviewed images in epic and the results pertinent to this  consultation are: No new imaging.  Assessment: 74 year old woman with relatively abrupt decline in her mentation in conjunction with chest pain and thrombocytopenia.  Did have some improvement steroids and thiamine suggesting this could be some sort of autoimmune encephalopathy versus Wernicke's.  Initial imaging not suggestive of Wernicke's.  Repeat imaging also not suggestive of Wernicke's.  Initial LP had mildly elevated protein at 67.  Repeat LP yesterday had protein of 49.  0 white cells. She has undergone plasma exchange prior to autoimmune encephalitis panels being sent and was reported negative but exchange could have led to spurious result. Plasma exchanges greater moving serum antibodies, but if there is any intrathecal production  of antibodies, plasma exchange might not provide adequate removal.  Given her current debility, Dr. Leonel Ramsay, recommended IVIG after repeating a spinal tap and sending CSF for autoimmune encephalopathy panel with the hope that it might have a higher yield of providing an answer this time. IVIG initiated yesterday after spinal tap.  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  I discussed the plan in detail with the daughter at bedside I will continue to follow.  -- Amie Portland, MD [facility] Pager: [phone] If 7pm to 7am, please call on call as listed on AMION.

## 2020-01-18 ENCOUNTER — Inpatient Hospital Stay (HOSPITAL_COMMUNITY): Payer: Medicare Other

## 2020-01-18 DIAGNOSIS — G9340 Encephalopathy, unspecified: Secondary | ICD-10-CM | POA: Diagnosis not present

## 2020-01-18 DIAGNOSIS — E6609 Other obesity due to excess calories: Secondary | ICD-10-CM | POA: Diagnosis not present

## 2020-01-18 DIAGNOSIS — I10 Essential (primary) hypertension: Secondary | ICD-10-CM | POA: Diagnosis not present

## 2020-01-18 LAB — BASIC METABOLIC PANEL
Anion gap: 8 (ref 5–15)
BUN: 32 mg/dL — ABNORMAL HIGH (ref 8–23)
CO2: 20 mmol/L — ABNORMAL LOW (ref 22–32)
Calcium: 8.4 mg/dL — ABNORMAL LOW (ref 8.9–10.3)
Chloride: 112 mmol/L — ABNORMAL HIGH (ref 98–111)
Creatinine, Ser: 1.66 mg/dL — ABNORMAL HIGH (ref 0.44–1.00)
GFR calc Af Amer: 35 mL/min — ABNORMAL LOW (ref >60)
GFR calc non Af Amer: 30 mL/min — ABNORMAL LOW (ref >60)
Glucose, Bld: 118 mg/dL — ABNORMAL HIGH (ref 70–99)
Potassium: 4 mmol/L (ref 3.5–5.1)
Sodium: 140 mmol/L (ref 135–145)

## 2020-01-18 LAB — TYPE AND SCREEN
ABO/RH(D): A POS
Antibody Screen: NEGATIVE
Unit division: 0

## 2020-01-18 LAB — IGG CSF INDEX
Albumin CSF-mCnc: 26 mg/dL (ref 10–46)
Albumin: 2.7 g/dL — ABNORMAL LOW (ref 3.7–4.7)
CSF IgG Index: 0.5 (ref 0.0–0.7)
IgG (Immunoglobin G), Serum: 856 mg/dL (ref 586–1602)
IgG, CSF: 4.5 mg/dL (ref 0.0–6.7)
IgG/Alb Ratio, CSF: 0.17 (ref 0.00–0.25)

## 2020-01-18 LAB — CBC
HCT: 31.1 % — ABNORMAL LOW (ref 36.0–46.0)
Hemoglobin: 9.8 g/dL — ABNORMAL LOW (ref 12.0–15.0)
MCH: 26.6 pg (ref 26.0–34.0)
MCHC: 31.5 g/dL (ref 30.0–36.0)
MCV: 84.3 fL (ref 80.0–100.0)
Platelets: 163 10*3/uL (ref 150–400)
RBC: 3.69 MIL/uL — ABNORMAL LOW (ref 3.87–5.11)
RDW: 20.2 % — ABNORMAL HIGH (ref 11.5–15.5)
WBC: 6.1 10*3/uL (ref 4.0–10.5)
nRBC: 0 % (ref 0.0–0.2)

## 2020-01-18 LAB — GLUCOSE, CAPILLARY
Glucose-Capillary: 101 mg/dL — ABNORMAL HIGH (ref 70–99)
Glucose-Capillary: 116 mg/dL — ABNORMAL HIGH (ref 70–99)
Glucose-Capillary: 133 mg/dL — ABNORMAL HIGH (ref 70–99)
Glucose-Capillary: 135 mg/dL — ABNORMAL HIGH (ref 70–99)
Glucose-Capillary: 144 mg/dL — ABNORMAL HIGH (ref 70–99)
Glucose-Capillary: 71 mg/dL (ref 70–99)

## 2020-01-18 LAB — BPAM RBC
Blood Product Expiration Date: 202108072359
ISSUE DATE / TIME: 202107130941
Unit Type and Rh: 6200

## 2020-01-18 LAB — MAGNESIUM: Magnesium: 1.8 mg/dL (ref 1.7–2.4)

## 2020-01-18 LAB — VDRL, CSF: VDRL Quant, CSF: NONREACTIVE

## 2020-01-18 MED ORDER — FREE WATER
150.0000 mL | Status: DC
  Administered 2020-01-18 – 2020-01-25 (×43): 150 mL

## 2020-01-18 NOTE — Progress Notes (Addendum)
Nutrition Follow-up  DOCUMENTATION CODES:   Not applicable  INTERVENTION:  Once Cortrak placement is verified via xray, continue:  -Vital 1.5 cal @ 32m/hr -473mProsource TF BID -15067mree water Q4H (or per MD/PA)  Tube feeding regimen provides 2060 kcal, 111 grams protein, 1003m64mee water (1903ml52mal with free water flushes)   NUTRITION DIAGNOSIS:   Inadequate oral intake related to inability to eat as evidenced by NPO status.  Ongoing.  GOAL:   Patient will meet greater than or equal to 90% of their needs  Met with TF.   MONITOR:   Labs, I & O's, TF tolerance, Weight trends, Diet advancement  REASON FOR ASSESSMENT:   Consult Enteral/tube feeding initiation and management  ASSESSMENT:   73 [facility] being admitted there initially for chest pain on June 15th while vacationing. Patient subesequently developed AMS, fever, thrombocytopenia, TTP was ruled out, underwent 5 rounds of plasmapheresis and started on steroids with no significant improvement. Past medical history significant for HTN, CKD IV, IDDM, history of gastric bypass (2011).  7/12 s/p LP (negative); pt removed NGT 7/13 IVIG started 7/14 Cortrak replaced (post-pyloric, placement verification pending)  Discussed pt with RN.   Current TF: Vital 1.5 cal @ 55ml/26m45ml P14murce TF BID  Labs reviewed. CBGs [id number]: Colace, Novolog, Levemir, Senokot, thiamine   NUTRITION - FOCUSED PHYSICAL EXAM:  Unable to perform at this time, will attempt at follow-up.   Diet Order:   Diet Order            Diet NPO time specified  Diet effective now                 EDUCATION NEEDS:   No education needs have been identified at this time  Skin:  Skin Assessment: Skin Integrity Issues: Skin Integrity Issues:: Stage I, Other (Comment) Stage I: perineum Other: MASD;bilateral buttocks;groin  Last BM:  7/13  type 6  Height:   Ht Readings from Last 1 Encounters:  09/28/19 5' 4.8" (1.646 m)    Weight:   Wt Readings from Last 1 Encounters:  01/16/20 80.5 kg    BMI:  Body mass index is 29.72 kg/m.  Estimated Nutritional Needs:   Kcal:  2000-2200  Protein:  95-110  Fluid:  >/= 2 L/day    Neziah Braley Larkin InaD, LDN RD pager number and weekend/on-call pager number located in [email or web address]

## 2020-01-18 NOTE — Progress Notes (Signed)
Miranda Roman  JOI:[id number] DOB: [birth year] DOA: 01/13/2020 PCP: Glendale Chard, MD    Brief Narrative:  74 year old with a history of DM2, CKD stage IV, Maxbass trait, B thalasemia, and prior bariatric surgery who was transferred to [facility] from [facility] where she originally presented 12/21/2019 with complaints of chest pain.  A VQ scan and Dopplers were negative at that facility, and a cardiac work-up revealed only moderate pulmonary hypertension.  Her hospital course was complicated by the sudden onset of encephalopathy leukocytosis and fever.  ID and Neurology were consulted and the patient underwent MRI of the brain and a lumbar puncture, both of which were unrevealing.  EEG was unrevealing.  Her case was further complicated by anemia, thrombocytopenia, and acute kidney injury with consideration being given to possible TTP.  She was treated with 5 rounds of plasmapheresis and steroids without improvement.  Adam TS 13 was normal.  Bone marrow biopsy was performed.  At request of the family she was transferred to [facility].  Work-up after arrival to [facility] has included a negative long-term EEG, negative MRI brain, and negative repeat LP.  She had a repeat course of IVIG x5 days.  Antimicrobials:  None  Subjective: Opens her eyes and follows the examiner. Does not attempt to speak. No respiratory distress. A cortrak was able to be replaced this morning.   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 -MRI brain 7/11-negative -Continue Keppra 500 mg IV twice daily -Repeat LP 7/12-negative.  IVIG started 7/12 X 5 days -Neurology following  Loose stools Secondary  to tube feeding with no evidence of C. difficile clinically  Hyponatremia due to mild dehydration Corrected with sodium now normal  DM2 Utilizes insulin pump at baseline -CBG presently well controlled  Vitamin D deficiency  Anemia of chronic disease -sickle cell trait -beta thalassemia Status post transfusion of 1U PRBC 7/13  -hemoglobin improved as expected and appears stable for now  CKD stage V Baseline appears to be approximately 2.4 -creatinine presently better than suspected baseline at 1.66  HTN Blood pressure presently controlled  Mild to moderate protein calorie malnutrition with dysphagia Not a candidate for PEG tube given history of gastric bypass -we will have to consider J-tube per general surgery if long-term tube feeds requirement  L1-L5 vertebral body/sacral sclerosis Noted per hematology at [facility] -felt to be due to sickle cell trait  DVT prophylaxis: Subcutaneous heparin Code Status: FULL CODE Family Communication: Spoke with son, sister, and multiple other family members via conference call at great length during visit in room Status is: Inpatient  Remains inpatient appropriate because:Inpatient level of care appropriate due to severity of illness   Dispo: The patient is from: Home              Anticipated d/c is to: [facility]              Anticipated d/c date is: > 3 days              Patient currently is not medically stable to d/c.   Consultants:  Neurology  Objective: Blood pressure 124/60, pulse 96, temperature 98.7 F (37.1 C), temperature source Oral, resp. rate 20, weight 80.5 kg, SpO2 100 %.  Intake/Output Summary (Last 24 hours) at 01/18/2020 1029 Last data filed  at 01/18/2020 0554 Gross per 24 hour  Intake 2249.3 ml  Output 1100 ml  Net 1149.3 ml   Filed Weights   01/14/20 0314 01/15/20 0317 01/16/20 0444  Weight: 78.3 kg 77 kg 80.5 kg    Examination: General: No acute respiratory distress Lungs: Clear to auscultation  bilaterally without wheezes or crackles Cardiovascular: Regular rate and rhythm without murmur gallop or rub normal S1 and S2 Abdomen: Nontender, nondistended, soft, bowel sounds positive, no rebound, no ascites, no appreciable mass Extremities: No significant cyanosis, clubbing, or edema bilateral lower extremities  CBC: Recent Labs  Lab 01/16/20 0503 01/16/20 0503 01/17/20 0407 01/17/20 1451 01/18/20 0605  WBC 6.5  --  6.7  --  6.1  HGB 7.7*   < > 6.9* 9.5* 9.8*  HCT 25.8*   < > 22.7* 30.4* 31.1*  MCV 89.0  --  88.7  --  84.3  PLT 216  --  171  --  163   < > = values in this interval not displayed.   Basic Metabolic Panel: Recent Labs  Lab 01/16/20 0503 01/17/20 0407 01/18/20 0605  NA 148* 142 140  K 4.6 4.7 4.0  CL 119* 112* 112*  CO2 20* 20* 20*  GLUCOSE 202* 251* 118*  BUN 42* 44* 32*  CREATININE 1.82* 1.92* 1.66*  CALCIUM 8.2* 8.2* 8.4*  MG 2.5* 2.1 1.8   GFR: Estimated Creatinine Clearance: 32 mL/min (A) (by C-G formula based on SCr of 1.66 mg/dL (H)).  Liver Function Tests: Recent Labs  Lab 01/14/20 0514  AST 23  ALT 63*  ALKPHOS 72  BILITOT 0.5  PROT 5.4*  ALBUMIN 2.2*    Recent Labs  Lab 01/14/20 1044  AMMONIA 26    HbA1C: Hgb A1c MFr Bld  Date/Time Value Ref Range Status  01/14/2020 05:14 AM 7.1 (H) 4.8 - 5.6 % Final    Comment:    (NOTE) Pre diabetes:          5.7%-6.4%  Diabetes:              >6.4%  Glycemic control for   <7.0% adults with diabetes   09/28/2019 11:05 AM 6.1 (H) 4.8 - 5.6 % Final    Comment:             Prediabetes: 5.7 - 6.4          Diabetes: >6.4          Glycemic control for adults with diabetes: <7.0     CBG: Recent Labs  Lab 01/17/20 1642 01/17/20 2005 01/17/20 2359 01/18/20 0412 01/18/20 0756  GLUCAP 115* 121* 116* 135* 101*    Recent Results (from the past 240 hour(s))  MRSA PCR Screening     Status: None   Collection Time: 01/13/20  8:30 PM   Specimen: Nasal Mucosa; Nasopharyngeal   Result Value Ref Range Status   MRSA by PCR NEGATIVE NEGATIVE Final    Comment:        The GeneXpert MRSA Assay (FDA approved for NASAL specimens only), is one component of a comprehensive MRSA colonization surveillance program. It is not intended to diagnose MRSA infection nor to guide or monitor treatment for MRSA infections. Performed at [facility], [place] [address] [place], [place] [address]   CSF culture     Status: None (Preliminary result)   Collection Time: 01/16/20 12:14 PM   Specimen: PATH Cytology CSF; Cerebrospinal Fluid  Result Value Ref Range Status   Specimen Description CSF  Final   Special Requests NONE  Final   Gram Stain   Final    WBC PRESENT,BOTH PMN AND MONONUCLEAR NO ORGANISMS SEEN CYTOSPIN SMEAR    Culture   Final    NO GROWTH 2 DAYS Performed at [facility], [address] [place], [place] [address]    Report Status PENDING  Incomplete     Scheduled Meds: . amantadine  100 mg Per Tube Daily  . chlorhexidine  15 mL Mouth Rinse BID  . chlorhexidine  15 mL Mouth Rinse BID  . docusate  100 mg Per Tube BID  . feeding supplement (PROSource TF)  45 mL Per Tube BID  . heparin  5,000 Units Subcutaneous Q8H  . insulin aspart  0-15 Units Subcutaneous Q4H  . insulin detemir  12 Units Subcutaneous QHS  . lidocaine  1 patch Transdermal Q24H  . mouth rinse  15 mL Mouth Rinse q12n4p  . mouth rinse  15 mL Mouth Rinse q12n4p  . metoprolol tartrate  50 mg Per Tube BID  . sennosides  5 mL Per Tube BID  . sodium chloride flush  3 mL Intravenous Q12H  . thiamine injection  100 mg Intravenous Daily   Continuous Infusions: . sodium chloride Stopped (01/18/20 0410)  . feeding supplement (VITAL 1.5 CAL) Stopped (01/16/20 0955)  . Immune Globulin 10% 192 mL/hr at 01/17/20 2132  . levETIRAcetam Stopped (01/18/20 0022)     LOS: 5 days   Cherene Altes, MD [facility] Office  [phone] Pager - Text Page per Amion  If 7PM-7AM,  please contact night-coverage per Amion 01/18/2020, 10:29 AM

## 2020-01-18 NOTE — Progress Notes (Signed)
Neurology Progress Note   S:// Seen and examined.  No acute changes overnight. Met with the son and sister at bedside. Son specifically asked me about the need for Keppra.  Please see my discussion with them below.  O:// Current vital signs: BP 124/60 (BP Location: Right Arm)   Pulse 96   Temp 98.7 F (37.1 C) (Oral)   Resp 20   Wt 80.5 kg   SpO2 100%   BMI 29.72 kg/m  Vital signs in last 24 hours: Temp:  [97.9 F (36.6 C)-99.5 F (37.5 C)] 98.7 F (37.1 C) (07/14 0754) Pulse Rate:  [96-105] 96 (07/14 0754) Resp:  [16-20] 20 (07/14 0754) BP: (124-189)/(60-94) 124/60 (07/14 0754) SpO2:  [100 %] 100 % (07/14 0754) Exam essentially remains unchanged from yesterday. She was sleeping in bed, opens eyes to voice, tract my movements around the bed. She was nonverbal. She did not follow any commands Her pupils are equal round reactive to light, extraocular movements appear intact, face appears symmetric. Grimacing to noxious stimulation and localizing to mild sternal rub same as yesterday.   Medications  Current Facility-Administered Medications:  .  0.9 %  sodium chloride infusion, 250 mL, Intravenous, PRN, Darrick Meigs, Rylee, MD, Paused at 01/18/20 0410 .  acetaminophen (TYLENOL) tablet 650 mg, 650 mg, Per Tube, Q6H PRN, 650 mg at 01/17/20 0053 **OR** acetaminophen (TYLENOL) suppository 650 mg, 650 mg, Rectal, Q6H PRN, Pierce, Dwayne A, RPH .  amantadine (SYMMETREL) capsule 100 mg, 100 mg, Per Tube, Daily, Pierce, Dwayne A, RPH, 100 mg at 01/16/20 0947 .  chlorhexidine (PERIDEX) 0.12 % solution 15 mL, 15 mL, Mouth Rinse, BID, Agarwala, Ravi, MD, 15 mL at 01/17/20 2346 .  chlorhexidine (PERIDEX) 0.12 % solution 15 mL, 15 mL, Mouth Rinse, BID, Agarwala, Ravi, MD, 15 mL at 01/17/20 2311 .  dextrose 5 %-0.45 % sodium chloride infusion, , Intravenous, Continuous, Amin, Ankit Chirag, MD, Last Rate: 75 mL/hr at 01/18/20 0554, Rate Verify at 01/18/20 0554 .  docusate (COLACE) 50 MG/5ML  liquid 100 mg, 100 mg, Per Tube, BID, Agarwala, Ravi, MD, 100 mg at 01/17/20 0053 .  feeding supplement (PROSource TF) liquid 45 mL, 45 mL, Per Tube, BID, Amin, Ankit Chirag, MD, 45 mL at 01/17/20 0044 .  feeding supplement (VITAL 1.5 CAL) liquid 1,000 mL, 1,000 mL, Per Tube, Continuous, Amin, Ankit Chirag, MD, Stopped at 01/16/20 0955 .  heparin injection 5,000 Units, 5,000 Units, Subcutaneous, Q8H, Christian, Rylee, MD, 5,000 Units at 01/18/20 0637 .  hydrALAZINE (APRESOLINE) injection 10-20 mg, 10-20 mg, Intravenous, Q4H PRN, Frederik Pear, MD, 20 mg at 01/18/20 0439 .  Immune Globulin 10% (PRIVIGEN) IV infusion 30 g, 400 mg/kg, Intravenous, Q24 Hr x 5, Amie Portland, MD, Last Rate: 192 mL/hr at 01/17/20 2132, Rate Change at 01/17/20 2132 .  insulin aspart (novoLOG) injection 0-15 Units, 0-15 Units, Subcutaneous, Q4H, Frederik Pear, MD, 2 Units at 01/18/20 0438 .  insulin detemir (LEVEMIR) injection 12 Units, 12 Units, Subcutaneous, QHS, Amin, Ankit Chirag, MD, 12 Units at 01/17/20 2345 .  labetalol (NORMODYNE) injection 10-20 mg, 10-20 mg, Intravenous, Q2H PRN, Frederik Pear, MD, 20 mg at 01/18/20 0018 .  levETIRAcetam (KEPPRA) IVPB 500 mg/100 mL premix, 500 mg, Intravenous, Q12H, Blenda Nicely, RPH, Stopped at 01/18/20 0022 .  lidocaine (LIDODERM) 5 % 1 patch, 1 patch, Transdermal, Q24H, Amin, Ankit Chirag, MD, 1 patch at 01/17/20 1212 .  MEDLINE mouth rinse, 15 mL, Mouth Rinse, q12n4p, Agarwala, Ravi, MD, 15 mL at  01/17/20 1706 .  MEDLINE mouth rinse, 15 mL, Mouth Rinse, q12n4p, Agarwala, Ravi, MD, 15 mL at 01/17/20 1706 .  metoprolol tartrate (LOPRESSOR) tablet 50 mg, 50 mg, Per Tube, BID, Joselyn Glassman A, RPH, 50 mg at 01/17/20 0045 .  polyethylene glycol (MIRALAX / GLYCOLAX) packet 17 g, 17 g, Per Tube, Daily PRN, Joselyn Glassman A, RPH .  senna-docusate (Senokot-S) tablet 2 tablet, 2 tablet, Per Tube, QHS PRN, Joselyn Glassman A, RPH .  sennosides (SENOKOT) 8.8 MG/5ML syrup 5  mL, 5 mL, Per Tube, BID, Agarwala, Ravi, MD, 5 mL at 01/17/20 0044 .  sodium chloride flush (NS) 0.9 % injection 3 mL, 3 mL, Intravenous, Q12H, Christian, Rylee, MD, 3 mL at 01/18/20 0015 .  sodium chloride flush (NS) 0.9 % injection 3 mL, 3 mL, Intravenous, PRN, Christian, Rylee, MD .  thiamine (B-1) injection 100 mg, 100 mg, Intravenous, Daily, Christian, Rylee, MD, 100 mg at 01/17/20 1353 Labs CBC    Component Value Date/Time   WBC 6.1 01/18/2020 0605   RBC 3.69 (L) 01/18/2020 0605   HGB 9.8 (L) 01/18/2020 0605   HGB 10.8 (L) 09/28/2019 1105   HCT 31.1 (L) 01/18/2020 0605   HCT 36.1 09/28/2019 1105   PLT 163 01/18/2020 0605   PLT 200 09/28/2019 1105   MCV 84.3 01/18/2020 0605   MCV 73 (L) 09/28/2019 1105   MCH 26.6 01/18/2020 0605   MCHC 31.5 01/18/2020 0605   RDW 20.2 (H) 01/18/2020 0605   RDW 17.1 (H) 09/28/2019 1105   LYMPHSABS 2.0 12/28/2018 1216   MONOABS 0.6 12/28/2018 1216   EOSABS 0.2 12/28/2018 1216   BASOSABS 0.0 12/28/2018 1216    CMP     Component Value Date/Time   NA 140 01/18/2020 0605   NA 143 09/28/2019 1105   K 4.0 01/18/2020 0605   CL 112 (H) 01/18/2020 0605   CO2 20 (L) 01/18/2020 0605   GLUCOSE 118 (H) 01/18/2020 0605   BUN 32 (H) 01/18/2020 0605   BUN 45 (H) 09/28/2019 1105   CREATININE 1.66 (H) 01/18/2020 0605   CALCIUM 8.4 (L) 01/18/2020 0605   CALCIUM 9.2 04/26/2019 0858   PROT 5.4 (L) 01/14/2020 0514   PROT 6.6 09/28/2019 1105   ALBUMIN 2.2 (L) 01/14/2020 0514   ALBUMIN 4.3 09/28/2019 1105   AST 23 01/14/2020 0514   ALT 63 (H) 01/14/2020 0514   ALKPHOS 72 01/14/2020 0514   BILITOT 0.5 01/14/2020 0514   BILITOT 0.4 09/28/2019 1105   GFRNONAA 30 (L) 01/18/2020 0605   GFRAA 35 (L) 01/18/2020 8416     Imaging I have reviewed images in epic and the results pertinent to this consultation are: No new imaging to review  74 year old woman with relatively abrupt decline in her mentation in conjunction with chest pain and thrombocytopenia.   Did have some improvement steroids and thiamine suggesting this could be some sort of autoimmune encephalopathy versus Wernicke's.  Initial imaging not suggestive of Wernicke's.  Repeat imaging also not suggestive of Wernicke's.  Initial LP had mildly elevated protein at 67.  Repeat LP day before yesterday had protein of 49.  0 white cells. She has undergone plasma exchange prior to autoimmune encephalitis panels being sent and was reported negative but exchange could have led to spurious result. Plasma exchanges greater moving serum antibodies, but if there is any intrathecal production of antibodies, plasma exchange might not provide adequate removal.  Given her current debility, Dr. Leonel Ramsay, recommended IVIG after repeating a spinal tap and  sending CSF for autoimmune encephalopathy panel with the hope that it might have a higher yield of providing an answer this time. IVIG initiated 2 days ago after spinal tap.  Son specifically asked me about her being on Keppra.  She is at a renally appropriate dose of 500 twice daily.  Her's EEG showed periodic discharges in a triphasic fashion which were epileptiform but not epileptic-likely secondary to encephalopathy but given no clear cause of her current symptoms, I would for now continue her on Keppra.  Over the next few days I will try to taper it down and obtain a routine EEG.  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  We will consider tapering it down in the next few days and obtaining a repeat EEG at that time  as well. -Management of anemia per primary team.  I discussed the plan in detail with the son and sister at bedside and the daughter over the phone   I will continue to follow.  -- Amie Portland, MD [facility] Pager: [phone] If 7pm to 7am, please call on call as listed on AMION.

## 2020-01-18 NOTE — Procedures (Signed)
Cortrak  Person Inserting Tube:  Rosezetta Schlatter, RD Tube Type:  Cortrak - 43 inches Tube Location:  Left nare Initial Placement:  Postpyloric Secured by: Bridle Technique Used to Measure Tube Placement:  Documented cm marking at nare/ corner of mouth Cortrak Secured At:  72 cm Procedure Comments:  Cortrak Tube Team Note:  Consult received to place a Cortrak feeding tube.   X-ray is required, abdominal x-ray has been ordered by the Cortrak team. Please confirm tube placement before using the Cortrak tube.   If the tube becomes dislodged please keep the tube and contact the Cortrak team at [email or web address] ([email or web address]) for replacement.  If after hours and replacement cannot be delayed, place a NG tube and confirm placement with an abdominal x-ray.      Jarome Matin, MS, RD, LDN, CNSC Inpatient Clinical Dietitian RD pager # available in [place]  After hours/weekend pager # available in [facility]

## 2020-01-19 DIAGNOSIS — I10 Essential (primary) hypertension: Secondary | ICD-10-CM | POA: Diagnosis not present

## 2020-01-19 DIAGNOSIS — G9340 Encephalopathy, unspecified: Secondary | ICD-10-CM | POA: Diagnosis not present

## 2020-01-19 DIAGNOSIS — E6609 Other obesity due to excess calories: Secondary | ICD-10-CM | POA: Diagnosis not present

## 2020-01-19 LAB — GLUCOSE, CAPILLARY
Glucose-Capillary: 197 mg/dL — ABNORMAL HIGH (ref 70–99)
Glucose-Capillary: 198 mg/dL — ABNORMAL HIGH (ref 70–99)
Glucose-Capillary: 199 mg/dL — ABNORMAL HIGH (ref 70–99)
Glucose-Capillary: 213 mg/dL — ABNORMAL HIGH (ref 70–99)
Glucose-Capillary: 238 mg/dL — ABNORMAL HIGH (ref 70–99)
Glucose-Capillary: 239 mg/dL — ABNORMAL HIGH (ref 70–99)
Glucose-Capillary: 253 mg/dL — ABNORMAL HIGH (ref 70–99)

## 2020-01-19 LAB — CSF CULTURE W GRAM STAIN: Culture: NO GROWTH

## 2020-01-19 LAB — OLIGOCLONAL BANDS, CSF + SERM

## 2020-01-19 MED ORDER — INSULIN DETEMIR 100 UNIT/ML ~~LOC~~ SOLN
14.0000 [IU] | Freq: Every day | SUBCUTANEOUS | Status: DC
  Administered 2020-01-19 – 2020-01-22 (×4): 14 [IU] via SUBCUTANEOUS
  Filled 2020-01-19 (×5): qty 0.14

## 2020-01-19 NOTE — Progress Notes (Signed)
Neurology Progress Note   S:// Seen and examined.  Very drowsy and difficult to arouse this morning.  O:// Current vital signs: BP 129/63 (BP Location: Right Arm)   Pulse 90   Temp (!) 97.3 F (36.3 C) (Axillary)   Resp 20   Wt 77 kg   SpO2 100%   BMI 28.42 kg/m  Vital signs in last 24 hours: Temp:  [97.3 F (36.3 C)-100 F (37.8 C)] 97.3 F (36.3 C) (07/15 1149) Pulse Rate:  [90-106] 90 (07/15 1149) Resp:  [17-20] 20 (07/15 1149) BP: (124-156)/(58-79) 129/63 (07/15 1149) SpO2:  [99 %-100 %] 100 % (07/15 1149) Weight:  [77 kg] 77 kg (07/15 0500) Limited exam. Opens eyes to voice, tracks Does not follow commands   Medications  Current Facility-Administered Medications:  .  0.9 %  sodium chloride infusion, 250 mL, Intravenous, PRN, Christian, Rylee, MD, Last Rate: 10 mL/hr at 01/19/20 1445, 250 mL at 01/19/20 1445 .  acetaminophen (TYLENOL) tablet 650 mg, 650 mg, Per Tube, Q6H PRN, 650 mg at 01/17/20 0053 **OR** [DISCONTINUED] acetaminophen (TYLENOL) suppository 650 mg, 650 mg, Rectal, Q6H PRN, Pierce, Dwayne A, RPH .  amantadine (SYMMETREL) capsule 100 mg, 100 mg, Per Tube, Daily, Pierce, Dwayne A, RPH, 100 mg at 01/16/20 0947 .  chlorhexidine (PERIDEX) 0.12 % solution 15 mL, 15 mL, Mouth Rinse, BID, Agarwala, Ravi, MD, 15 mL at 01/19/20 0852 .  docusate (COLACE) 50 MG/5ML liquid 100 mg, 100 mg, Per Tube, BID, Agarwala, Ravi, MD, 100 mg at 01/19/20 0902 .  feeding supplement (PROSource TF) liquid 45 mL, 45 mL, Per Tube, BID, Amin, Ankit Chirag, MD, 45 mL at 01/19/20 0903 .  feeding supplement (VITAL 1.5 CAL) liquid 1,000 mL, 1,000 mL, Per Tube, Continuous, Amin, Ankit Chirag, MD, Last Rate: 55 mL/hr at 01/18/20 2113, 1,000 mL at 01/18/20 2113 .  free water 150 mL, 150 mL, Per Tube, Q4H, Cherene Altes, MD, 150 mL at 01/19/20 1229 .  heparin injection 5,000 Units, 5,000 Units, Subcutaneous, Q8H, Christian, Rylee, MD, 5,000 Units at 01/19/20 1229 .  hydrALAZINE  (APRESOLINE) injection 10-20 mg, 10-20 mg, Intravenous, Q4H PRN, Frederik Pear, MD, 20 mg at 01/18/20 0439 .  Immune Globulin 10% (PRIVIGEN) IV infusion 30 g, 400 mg/kg, Intravenous, Q24 Hr x 5, Amie Portland, MD, Stopped at 01/18/20 2255 .  insulin aspart (novoLOG) injection 0-15 Units, 0-15 Units, Subcutaneous, Q4H, Frederik Pear, MD, 5 Units at 01/19/20 1228 .  insulin detemir (LEVEMIR) injection 12 Units, 12 Units, Subcutaneous, QHS, Amin, Ankit Chirag, MD, 12 Units at 01/17/20 2345 .  labetalol (NORMODYNE) injection 10-20 mg, 10-20 mg, Intravenous, Q2H PRN, Frederik Pear, MD, 20 mg at 01/18/20 0018 .  levETIRAcetam (KEPPRA) IVPB 500 mg/100 mL premix, 500 mg, Intravenous, Q12H, Blenda Nicely, [facility], Last Rate: 400 mL/hr at 01/19/20 0852, 500 mg at 01/19/20 0852 .  lidocaine (LIDODERM) 5 % 1 patch, 1 patch, Transdermal, Q24H, Amin, Ankit Chirag, MD, 1 patch at 01/19/20 1223 .  MEDLINE mouth rinse, 15 mL, Mouth Rinse, q12n4p, Agarwala, Ravi, MD, 15 mL at 01/19/20 1229 .  metoprolol tartrate (LOPRESSOR) tablet 50 mg, 50 mg, Per Tube, BID, Joselyn Glassman A, RPH, 50 mg at 01/19/20 0903 .  polyethylene glycol (MIRALAX / GLYCOLAX) packet 17 g, 17 g, Per Tube, Daily PRN, Joselyn Glassman A, RPH .  senna-docusate (Senokot-S) tablet 2 tablet, 2 tablet, Per Tube, QHS PRN, Joselyn Glassman A, RPH .  sennosides (SENOKOT) 8.8 MG/5ML syrup 5 mL, 5  mL, Per Tube, BID, Agarwala, Ravi, MD, 5 mL at 01/19/20 0904 .  sodium chloride flush (NS) 0.9 % injection 3 mL, 3 mL, Intravenous, Q12H, Christian, Rylee, MD, 3 mL at 01/18/20 2134 .  sodium chloride flush (NS) 0.9 % injection 3 mL, 3 mL, Intravenous, PRN, Christian, Rylee, MD .  thiamine (B-1) injection 100 mg, 100 mg, Intravenous, Daily, Christian, Rylee, MD, 100 mg at 01/19/20 0904 Labs CBC    Component Value Date/Time   WBC 6.1 01/18/2020 0605   RBC 3.69 (L) 01/18/2020 0605   HGB 9.8 (L) 01/18/2020 0605   HGB 10.8 (L) 09/28/2019 1105   HCT 31.1 (L)  01/18/2020 0605   HCT 36.1 09/28/2019 1105   PLT 163 01/18/2020 0605   PLT 200 09/28/2019 1105   MCV 84.3 01/18/2020 0605   MCV 73 (L) 09/28/2019 1105   MCH 26.6 01/18/2020 0605   MCHC 31.5 01/18/2020 0605   RDW 20.2 (H) 01/18/2020 0605   RDW 17.1 (H) 09/28/2019 1105   LYMPHSABS 2.0 12/28/2018 1216   MONOABS 0.6 12/28/2018 1216   EOSABS 0.2 12/28/2018 1216   BASOSABS 0.0 12/28/2018 1216    CMP     Component Value Date/Time   NA 140 01/18/2020 0605   NA 143 09/28/2019 1105   K 4.0 01/18/2020 0605   CL 112 (H) 01/18/2020 0605   CO2 20 (L) 01/18/2020 0605   GLUCOSE 118 (H) 01/18/2020 0605   BUN 32 (H) 01/18/2020 0605   BUN 45 (H) 09/28/2019 1105   CREATININE 1.66 (H) 01/18/2020 0605   CALCIUM 8.4 (L) 01/18/2020 0605   CALCIUM 9.2 04/26/2019 0858   PROT 5.4 (L) 01/14/2020 0514   PROT 6.6 09/28/2019 1105   ALBUMIN 2.7 (L) 01/16/2020 1214   AST 23 01/14/2020 0514   ALT 63 (H) 01/14/2020 0514   ALKPHOS 72 01/14/2020 0514   BILITOT 0.5 01/14/2020 0514   BILITOT 0.4 09/28/2019 1105   GFRNONAA 30 (L) 01/18/2020 0605   GFRAA 35 (L) 01/18/2020 0605   LP results as noted in the last note.  Imaging I have reviewed images in epic and the results pertinent to this consultation are: No new imaging.  Assessment: 74 year old woman with relatively abrupt decline in her mentation in conjunction with chest pain and thrombocytopenia.  Did have some improvement steroids and thiamine suggesting this could be some sort of autoimmune encephalopathy versus Wernicke's.  Initial imaging not suggestive of Wernicke's.  Repeat imaging also not suggestive of Wernicke's.  Initial LP had mildly elevated protein at 67.  Repeat LP yesterday had protein of 49.  0 white cells. She has undergone plasma exchange prior to autoimmune encephalitis panels being sent and was reported negative but exchange could have led to spurious result. Plasma exchanges greater moving serum antibodies, but if there is any  intrathecal production of antibodies, plasma exchange might not provide adequate removal.  Given her current debility, Dr. Leonel Ramsay, recommended IVIG after repeating a spinal tap and sending CSF for autoimmune encephalopathy panel with the hope that it might have a higher yield of providing an answer this time. IVIG initiated yesterday after spinal tap.  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  I will try to reexamine her again today to see if she is more awake during the day.  -- Amie Portland, MD [facility] Pager: [phone] If 7pm to 7am, please call on call  as listed on AMION.

## 2020-01-19 NOTE — Progress Notes (Signed)
Miranda Roman  HER:[id number] DOB: 03-06-1946 DOA: 01/13/2020 PCP: Glendale Chard, MD    Brief Narrative:  74 year old with a history of DM2, CKD stage IV, [place] trait, B thalasemia, and prior bariatric surgery who was transferred to [facility] from [facility] where she originally presented 12/21/2019 with complaints of chest pain.  A VQ scan and Dopplers were negative at that facility, and a cardiac work-up revealed only moderate pulmonary hypertension.  Her hospital course was complicated by the sudden onset of encephalopathy leukocytosis and fever.  ID and Neurology were consulted and the patient underwent MRI of the brain and a lumbar puncture, both of which were unrevealing.  EEG was unrevealing.  Her case was further complicated by anemia, thrombocytopenia, and acute kidney injury with consideration being given to possible TTP.  She was treated with 5 rounds of plasmapheresis and steroids without improvement.  Adam TS 13 was normal.  Bone marrow biopsy was performed.  At request of the family she was transferred to [facility].  Work-up after arrival to [facility] has included a negative long-term EEG, negative MRI brain, and negative repeat LP.  She had a repeat course of IVIG x5 days.  Antimicrobials:  None  Subjective: Pt working w/ PT sitting up on side of bed. Holding head up on her own. No resp distress.   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 -MRI brain 7/11-negative -Continue Keppra 500 mg IV twice daily -Repeat LP 7/12-negative.  IVIG started 7/12 X 5 days -Neurology following  Loose stools Secondary to tube feeding with no evidence of C. difficile  clinically  Hyponatremia due to mild dehydration Corrected with sodium now normal  DM2 Utilizes insulin pump at baseline -CBG trending upward with resumption of tube feeding - adjust regimen and follow  Vitamin D deficiency  Anemia of chronic disease -sickle cell trait -beta thalassemia Status post transfusion of 1U PRBC 7/13  -hemoglobin improved as expected and appears stable for now - recheck in AM   CKD stage V Baseline appears to be approximately 2.4 -creatinine presently better than suspected baseline at 1.66  HTN Blood pressure presently controlled  Mild to moderate protein calorie malnutrition with dysphagia Not a candidate for PEG tube given history of gastric bypass -we will have to consider J-tube per general surgery if long-term tube feeds requirement  L1-L5 vertebral body/sacral sclerosis Noted per hematology at [facility] -felt to be due to sickle cell trait  DVT prophylaxis: Subcutaneous heparin Code Status: FULL CODE Family Communication: Spoke with son, sister, and multiple other family members via conference call at great length during visit in room Status is: Inpatient  Remains inpatient appropriate because:Inpatient level of care appropriate due to severity of illness   Dispo: The patient is from: Home              Anticipated d/c is to: [facility]              Anticipated d/c date is: > 3 days              Patient currently is not medically stable to d/c.   Consultants:  Neurology  Objective: Blood pressure (!) 137/58, pulse (!) 101, temperature 100 F (37.8 C), temperature source Axillary, resp. rate 20, weight 77 kg, SpO2 99 %.  Intake/Output Summary (Last 24 hours) at 01/19/2020 1007 Last data filed at 01/19/2020 0100 Gross per 24 hour  Intake --  Output 525 ml  Net -525 ml   Filed Weights   01/15/20 0317 01/16/20 0444 01/19/20 0500  Weight: 77 kg 80.5 kg 77 kg    Examination: General: No acute respiratory distress Lungs: CTA B - no  wheezing  Cardiovascular: RRR Abdomen: NT/ND, soft, BS+ Extremities: No significant cyanosis, clubbing, or edema bilateral lower extremities  CBC: Recent Labs  Lab 01/16/20 0503 01/16/20 0503 01/17/20 0407 01/17/20 1451 01/18/20 0605  WBC 6.5  --  6.7  --  6.1  HGB 7.7*   < > 6.9* 9.5* 9.8*  HCT 25.8*   < > 22.7* 30.4* 31.1*  MCV 89.0  --  88.7  --  84.3  PLT 216  --  171  --  163   < > = values in this interval not displayed.   Basic Metabolic Panel: Recent Labs  Lab 01/16/20 0503 01/17/20 0407 01/18/20 0605  NA 148* 142 140  K 4.6 4.7 4.0  CL 119* 112* 112*  CO2 20* 20* 20*  GLUCOSE 202* 251* 118*  BUN 42* 44* 32*  CREATININE 1.82* 1.92* 1.66*  CALCIUM 8.2* 8.2* 8.4*  MG 2.5* 2.1 1.8   GFR: Estimated Creatinine Clearance: 31.3 mL/min (A) (by C-G formula based on SCr of 1.66 mg/dL (H)).  Liver Function Tests: Recent Labs  Lab 01/14/20 0514 01/16/20 1214  AST 23  --   ALT 63*  --   ALKPHOS 72  --   BILITOT 0.5  --   PROT 5.4*  --   ALBUMIN 2.2* 2.7*    Recent Labs  Lab 01/14/20 1044  AMMONIA 26    HbA1C: Hgb A1c MFr Bld  Date/Time Value Ref Range Status  01/14/2020 05:14 AM 7.1 (H) 4.8 - 5.6 % Final    Comment:    (NOTE) Pre diabetes:          5.7%-6.4%  Diabetes:              >6.4%  Glycemic control for   <7.0% adults with diabetes   09/28/2019 11:05 AM 6.1 (H) 4.8 - 5.6 % Final    Comment:             Prediabetes: 5.7 - 6.4          Diabetes: >6.4          Glycemic control for adults with diabetes: <7.0     CBG: Recent Labs  Lab 01/18/20 1511 01/18/20 2012 01/19/20 0016 01/19/20 0405 01/19/20 0831  GLUCAP 133* 71 198* 213* 239*    Recent Results (from the past 240 hour(s))  MRSA PCR Screening     Status: None   Collection Time: 01/13/20  8:30 PM   Specimen: Nasal Mucosa; Nasopharyngeal  Result Value Ref Range Status   MRSA by PCR NEGATIVE NEGATIVE Final    Comment:        The GeneXpert MRSA Assay (FDA approved for  NASAL specimens only), is one component of a comprehensive MRSA colonization surveillance program. It is not intended to diagnose MRSA infection nor to guide or monitor treatment for MRSA infections. Performed at [facility], [place] [address] [place], [place] [address]   CSF culture     Status: None (Preliminary result)   Collection Time: 01/16/20 12:14 PM   Specimen: PATH Cytology CSF; Cerebrospinal Fluid  Result Value Ref Range Status  Specimen Description CSF  Final   Special Requests NONE  Final   Gram Stain   Final    WBC PRESENT,BOTH PMN AND MONONUCLEAR NO ORGANISMS SEEN CYTOSPIN SMEAR    Culture   Final    NO GROWTH 3 DAYS Performed at [facility], [address] [place], [place] [address]    Report Status PENDING  Incomplete     Scheduled Meds: . amantadine  100 mg Per Tube Daily  . chlorhexidine  15 mL Mouth Rinse BID  . docusate  100 mg Per Tube BID  . feeding supplement (PROSource TF)  45 mL Per Tube BID  . free water  150 mL Per Tube Q4H  . heparin  5,000 Units Subcutaneous Q8H  . insulin aspart  0-15 Units Subcutaneous Q4H  . insulin detemir  12 Units Subcutaneous QHS  . lidocaine  1 patch Transdermal Q24H  . mouth rinse  15 mL Mouth Rinse q12n4p  . metoprolol tartrate  50 mg Per Tube BID  . sennosides  5 mL Per Tube BID  . sodium chloride flush  3 mL Intravenous Q12H  . thiamine injection  100 mg Intravenous Daily   Continuous Infusions: . sodium chloride Stopped (01/18/20 0410)  . feeding supplement (VITAL 1.5 CAL) 1,000 mL (01/18/20 2113)  . Immune Globulin 10% Stopped (01/18/20 2255)  . levETIRAcetam 500 mg (01/19/20 0852)     LOS: 6 days   Cherene Altes, MD [facility] Office  [phone] Pager - Text Page per Amion  If 7PM-7AM, please contact night-coverage per Amion 01/19/2020, 10:07 AM

## 2020-01-19 NOTE — Progress Notes (Signed)
Physical Therapy Treatment Patient Details Name: Miranda Roman MRN: [id number] DOB: 1945-12-05 Today's Date: 01/19/2020    History of Present Illness Pateint is a 74 year old female. 3 weeks prior she injured her foot tubing on vacation. The next day she began to have chest pain and quickly developed acute encephalopathy. She was flown from [place] (on vacation) to [place] where she lives. 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 With +2 total assist able to sit pt on EOB. She was initially able to activate her postural muscles and hold her head upright, however as she fatigued, her neck began to flex. Attempted to elicit bil UE movement (especially at shoulders) without success. She did turn her head to the left x 1 to try to see her sister when she spoke. She smiled appropriately when she was returned to sidelying and I exclaimed, "Whew, I bet you're tired!" Sister, Miranda Roman present and called pt's family while PT working with pt to tell them what was going on.   Follow Up Recommendations  LTACH;SNF     Equipment Recommendations  None recommended by PT    Recommendations for Other Services       Precautions / Restrictions Precautions Precautions: None Restrictions Weight Bearing Restrictions: No    Mobility  Bed Mobility Overal bed mobility: Needs Assistance Bed Mobility: Rolling;Sidelying to Sit;Sit to Sidelying Rolling: Total assist;+2 for physical assistance Sidelying to sit: Total assist;+2 for physical assistance;HOB elevated;+2 for safety/equipment     Sit to sidelying: Total assist;+2 for physical assistance;+2 for safety/equipment General bed mobility comments: rolled rt, legs assisted off bed, then HOB elevated to ~45 degrees and assisted to sit EOB.  Transfers                    Ambulation/Gait                  Stairs             Wheelchair Mobility    Modified Rankin (Stroke Patients Only)       Balance Overall balance assessment: Needs assistance Sitting-balance support: No upper extremity supported;Feet supported Sitting balance-Leahy Scale: Poor Sitting balance - Comments: able to hold her head up immediately upon sitting (however as she fatigued, her head began to hang lower); sat ~15 minutes with varying assist depending on how far her trunk was outside her BOS (min assist over her BOS; total assist leaning down to each elbow Postural control: Left lateral lean                                  Cognition Arousal/Alertness: Awake/alert Behavior During Therapy: Flat affect Overall Cognitive Status: Difficult to assess                                 General Comments: eyes open entire session; tracking therapist consistently; head/eyes turn toward her sister each time her name is called; smiled at PT x 1; moving all 4's spontaneously in small ROM      Exercises Other Exercises Other Exercises: seated at EOB performed bil UE PROM with attempts at AAROM,however pt could not engage use of UEs    General Comments General comments (skin integrity, edema, etc.): Sister present  Pertinent Vitals/Pain Pain Assessment: Faces Faces Pain Scale: Hurts a little bit Pain Location: left hip with rolling rt Pain Descriptors / Indicators: Grimacing Pain Intervention(s): Monitored during session    Home Living                      Prior Function            PT Goals (current goals can now be found in the care plan section) Acute Rehab PT Goals Patient Stated Goal: unable to state  PT Goal Formulation: With family Time For Goal Achievement: 01/21/20 Progress towards PT goals: Progressing toward goals    Frequency    Min 2X/week      PT Plan Current plan remains appropriate    Co-evaluation              AM-PAC PT "6  Clicks" Mobility   Outcome Measure  Help needed turning from your back to your side while in a flat bed without using bedrails?: Total Help needed moving from lying on your back to sitting on the side of a flat bed without using bedrails?: Total Help needed moving to and from a bed to a chair (including a wheelchair)?: Total Help needed standing up from a chair using your arms (e.g., wheelchair or bedside chair)?: Total Help needed to walk in hospital room?: Total Help needed climbing 3-5 steps with a railing? : Total 6 Click Score: 6    End of Session   Activity Tolerance: Patient limited by fatigue Patient left: with bed alarm set;with family/visitor present;in bed;with call bell/phone within reach Nurse Communication: Mobility status PT Visit Diagnosis: Other abnormalities of gait and mobility (R26.89);Muscle weakness (generalized) (M62.81);Other symptoms and signs involving the nervous system (R29.898)     Time: 0737-1062 PT Time Calculation (min) (ACUTE ONLY): 30 min  Charges:  $Therapeutic Activity: 23-37 mins                      Miranda Roman, PT Pager [phone]    Miranda Roman 01/19/2020, 2:09 PM

## 2020-01-20 DIAGNOSIS — G9340 Encephalopathy, unspecified: Secondary | ICD-10-CM | POA: Diagnosis not present

## 2020-01-20 LAB — GLUCOSE, CAPILLARY
Glucose-Capillary: 179 mg/dL — ABNORMAL HIGH (ref 70–99)
Glucose-Capillary: 190 mg/dL — ABNORMAL HIGH (ref 70–99)
Glucose-Capillary: 223 mg/dL — ABNORMAL HIGH (ref 70–99)
Glucose-Capillary: 226 mg/dL — ABNORMAL HIGH (ref 70–99)
Glucose-Capillary: 210 mg/dL — ABNORMAL HIGH (ref 70–99)

## 2020-01-20 LAB — COMPREHENSIVE METABOLIC PANEL
ALT: 38 U/L (ref 0–44)
AST: 30 U/L (ref 15–41)
Albumin: 1.9 g/dL — ABNORMAL LOW (ref 3.5–5.0)
Alkaline Phosphatase: 51 U/L (ref 38–126)
Anion gap: 6 (ref 5–15)
BUN: 44 mg/dL — ABNORMAL HIGH (ref 8–23)
CO2: 19 mmol/L — ABNORMAL LOW (ref 22–32)
Calcium: 8.1 mg/dL — ABNORMAL LOW (ref 8.9–10.3)
Chloride: 110 mmol/L (ref 98–111)
Creatinine, Ser: 1.69 mg/dL — ABNORMAL HIGH (ref 0.44–1.00)
GFR calc Af Amer: 35 mL/min — ABNORMAL LOW (ref >60)
GFR calc non Af Amer: 30 mL/min — ABNORMAL LOW (ref >60)
Glucose, Bld: 207 mg/dL — ABNORMAL HIGH (ref 70–99)
Potassium: 4.6 mmol/L (ref 3.5–5.1)
Sodium: 135 mmol/L (ref 135–145)
Total Bilirubin: 0.5 mg/dL (ref 0.3–1.2)
Total Protein: 8.1 g/dL (ref 6.5–8.1)

## 2020-01-20 LAB — CBC
HCT: 29.9 % — ABNORMAL LOW (ref 36.0–46.0)
Hemoglobin: 9.3 g/dL — ABNORMAL LOW (ref 12.0–15.0)
MCH: 26.3 pg (ref 26.0–34.0)
MCHC: 31.1 g/dL (ref 30.0–36.0)
MCV: 84.7 fL (ref 80.0–100.0)
Platelets: 156 10*3/uL (ref 150–400)
RBC: 3.53 MIL/uL — ABNORMAL LOW (ref 3.87–5.11)
RDW: 19.4 % — ABNORMAL HIGH (ref 11.5–15.5)
WBC: 5.9 10*3/uL (ref 4.0–10.5)
nRBC: 0 % (ref 0.0–0.2)

## 2020-01-20 LAB — MAGNESIUM: Magnesium: 2 mg/dL (ref 1.7–2.4)

## 2020-01-20 MED ORDER — METOPROLOL TARTRATE 50 MG PO TABS
100.0000 mg | ORAL_TABLET | Freq: Two times a day (BID) | ORAL | Status: DC
  Administered 2020-01-20 – 2020-02-08 (×38): 100 mg
  Filled 2020-01-20 (×41): qty 2

## 2020-01-20 NOTE — Evaluation (Signed)
Occupational Therapy Evaluation Patient Details Name: Miranda Roman MRN: [id number] DOB: 1946/03/25 Today's Date: 01/20/2020    History of Present Illness Pateint is a 74 year old female. 3 weeks prior she injured her foot tubing on vacation. The next day she began to have chest pain and quickly developed acute encephalopathy. She was flown from [place] (on vacation) to [place] where she lives. 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 She demonstrates the below listed deficits and will benefit from continued OT to maximize safety and independence with BADLs.  Pt presents to OT with minimal responsiveness.  She will visually track and visually fixate on people.  She follows no commands, and does not engage with her environment.  She does grimace to pain with rolling and hip ROM.  Min- moderate spasticity noted bil. UEs with full PROM of hands and elbow noted, but limitations bil. Shoulders with scapular tightness.  Pt incontinent of urine, and after assisting her with rolling and peri care, she fell asleep which precluded ability to move to EOB sitting.  She requires total A with all aspects of ADLs.  PTA, she was fully independent with all ADLs including driving and community activities.  Will follow acutely.       Follow Up Recommendations  LTACH    Equipment Recommendations  None recommended by OT    Recommendations for Other Services       Precautions / Restrictions Precautions Precautions: None      Mobility Bed Mobility Overal bed mobility: Needs Assistance Bed Mobility: Rolling Rolling: Total assist         General bed mobility comments: with facilitation into flexion at hip and shoulder, pt able to assist a little with rolling to the Rt   Transfers                 General transfer comment: unable to safely attempt     Balance                                            ADL either performed or assessed with clinical judgement   ADL                                         General ADL Comments: Pt requires total A for all aspects and unable to assist      Vision Baseline Vision/History: Wears glasses Additional Comments: Pt will track left to Right, but unable to participate in formal assesment      Perception Perception Perception Tested?: No   Praxis Praxis Praxis tested?: Not tested    Pertinent Vitals/Pain Pain Assessment: Faces Faces Pain Scale: Hurts little more Pain Location: Lt hip with internal rotation  Pain Descriptors / Indicators: Grimacing Pain Intervention(s): Monitored during session     Hand Dominance Right   Extremity/Trunk Assessment Upper Extremity Assessment Upper Extremity Assessment: RUE deficits/detail;LUE deficits/detail RUE Deficits / Details: Pt with bil. UEs flexed over chest upon OT enterance.  Spontaneous movement noted, but it does not appear to be purposeful  RUE Coordination: decreased fine motor;decreased gross motor LUE Deficits / Details: Pt with bil. UEs flexed over chest upon OT enterance.  Spontaneous movement noted, but it does not  appear to be purposeful.  Moderate flexor spasticity at elbow, hand, and shoulder noted, but able to achieve full PROM of hand and elbow.   shoulder with PROM limited to ~100*.  scapular tightness noted  LUE Coordination: decreased fine motor;decreased gross motor   Lower Extremity Assessment Lower Extremity Assessment: Defer to PT evaluation   Cervical / Trunk Assessment Cervical / Trunk Assessment: Other exceptions Cervical / Trunk Exceptions: Pt with head/neck rotated to the Lt.  minimal tighness noted.  Decreased head/trunk control noted    Communication Communication Communication: Expressive difficulties;Receptive difficulties   Cognition Arousal/Alertness: Awake/alert Behavior During Therapy: Flat affect Overall  Cognitive Status: Impaired/Different from baseline Area of Impairment: Attention                               General Comments: Pt tracks people in room, and inconsistently visually fixates.  She did not follow commands or engage with her environment    General Comments  Pt wet with urine and was assisted with peri care, then fell asleep therefore EOB activity not attempted     Exercises Other Exercises Other Exercises: PROM performed All 4 extremeties    Shoulder Instructions      Home Living Family/patient expects to be discharged to:: Private residence Living Arrangements: Alone Available Help at Discharge: Family Type of Home: House Home Access: Stairs to enter [employer] of Steps: 2   Home Layout: One level     Bathroom Shower/Tub: Tub/shower unit;Walk-in shower   Bathroom Toilet: Standard     Home Equipment: Walker - 2 wheels          Prior Functioning/Environment Level of Independence: Independent        Comments: Per daughter, pt drove, watched her grandchildren, enjoys working puzzles and reading.  She is retired from the [employer] Problem List: Decreased strength;Decreased range of motion;Decreased activity tolerance;Impaired balance (sitting and/or standing);Impaired vision/perception;Decreased coordination;Decreased cognition;Decreased safety awareness;Impaired tone;Impaired UE functional use      OT Treatment/Interventions: Self-care/ADL training;Neuromuscular education;DME and/or AE instruction;Therapeutic activities;Cognitive remediation/compensation;Patient/family education;Visual/perceptual remediation/compensation;Balance training;Modalities;Splinting    OT Goals(Current goals can be found in the care plan section) Acute Rehab OT Goals Patient Stated Goal: pt unable to state.  Family is hoping that she will improve and be able to engage and be as independent as possible  OT Goal Formulation: With  patient Time For Goal Achievement: 02/03/20 Potential to Achieve Goals: Fair ADL Goals Additional ADL Goal #1: Pt will follow simple one step motor commands 25% of the time Additional ADL Goal #2: Pt will wash face with max A Additional ADL Goal #3: Pt will hold/attempt to manipulate item in her hand(s) with mod A  OT Frequency: Min 2X/week   Barriers to D/C: Decreased caregiver support  unsure family can provide necessary level of assist        Co-evaluation              AM-PAC OT "6 Clicks" Daily Activity     Outcome Measure Help from another person eating meals?: Total Help from another person taking care of personal grooming?: Total Help from another person toileting, which includes using toliet, bedpan, or urinal?: Total Help from another person bathing (including washing, rinsing, drying)?: Total Help from another person to put on and taking off regular upper body clothing?: Total Help from another person to put on and taking off regular lower  body clothing?: Total 6 Click Score: 6   End of Session Nurse Communication: Mobility status  Activity Tolerance: Patient tolerated treatment well Patient left: in bed;with call bell/phone within reach;with bed alarm set;with family/visitor present;with nursing/sitter in room  OT Visit Diagnosis: Cognitive communication deficit (R00.762)                Time: 2633-3545 OT Time Calculation (min): 45 min Charges:  OT General Charges $OT Visit: 1 Visit OT Evaluation $OT Eval Moderate Complexity: 1 Mod OT Treatments $Neuromuscular Re-education: 23-37 mins  Nilsa Nutting., OTR/L Acute Rehabilitation Services Pager [phone] Office [phone]   Lucille Passy M 01/20/2020, 5:39 PM

## 2020-01-20 NOTE — Progress Notes (Signed)
Miranda Roman  QZR:[id number] DOB: 26-Apr-1946 DOA: 01/13/2020 PCP: Glendale Chard, MD    Brief Narrative:  74 year old with a history of DM2, CKD stage IV, [place] trait, B thalasemia, and prior bariatric surgery who was transferred to [facility] from [facility] where she originally presented 12/21/2019 with complaints of chest pain.  A VQ scan and Dopplers were negative at that facility, and a cardiac work-up revealed only moderate pulmonary hypertension.  Her hospital course was complicated by the sudden onset of encephalopathy leukocytosis and fever.  ID and Neurology were consulted and the patient underwent MRI of the brain and a lumbar puncture, both of which were unrevealing.  EEG was unrevealing.  Her case was further complicated by anemia, thrombocytopenia, and acute kidney injury with consideration being given to possible TTP.  She was treated with 5 rounds of plasmapheresis and steroids without improvement.  Adam TS 13 was normal.  Bone marrow biopsy was performed.  At request of the family she was transferred to [facility].  Work-up after arrival to [facility] has included a negative long-term EEG, negative MRI brain, and negative repeat LP.  She had a repeat course of IVIG x5 days.  Antimicrobials:  None  Subjective: No significant changes appreciable in exam today. Spoke w/ family at bedside at length. No acute distress appreciable.   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 -MRI brain 7/11 negative -Continue Keppra 500 mg IV twice daily -Repeat LP 7/12 negative.  IVIG started 7/12 X 5 days -Neurology following  Loose stools Secondary to tube feeding with  no evidence of C. 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 Status post transfusion of 1U PRBC 7/13  -hemoglobin improved as expected and appears stable for now - follow intermittently   CKD stage V Baseline appears to be approximately 2.4 -creatinine presently better than suspected baseline at 1.66  HTN Blood pressure elevated above goal today - adjust tx and follow trend   Mild to moderate protein calorie malnutrition with dysphagia Not a candidate for PEG tube given history of gastric bypass -we will have to consider J-tube per general surgery if long-term tube feeds requirement  L1-L5 vertebral body/sacral sclerosis Noted per Hematology at [facility] -felt to be due to sickle cell trait  DVT prophylaxis: Subcutaneous heparin Code Status: FULL CODE Family Communication: Spoke with son, sister, and multiple other family members via conference call at great length during visit in room Status is: Inpatient  Remains inpatient appropriate because:Inpatient level of care appropriate due to severity of illness   Dispo: The patient is from: Home              Anticipated d/c is to: CIR              Anticipated d/c date is: > 3 days              Patient currently is not medically stable to d/c.   Consultants:  Neurology  Objective: Blood pressure (!) 153/77, pulse (!) 102, temperature 99.9 F (37.7  C), temperature source Axillary, resp. rate 20, weight 76 kg, SpO2 100 %.  Intake/Output Summary (Last 24 hours) at 01/20/2020 1017 Last data filed at 01/20/2020 0600 Gross per 24 hour  Intake 2215.02 ml  Output 2000 ml  Net 215.02 ml   Filed Weights   01/16/20 0444 01/19/20 0500 01/20/20 0500  Weight: 80.5 kg 77 kg 76 kg    Examination: General:  NAD Lungs: CTA B - no wheezing  Cardiovascular: RRR Abdomen: NT/ND, soft, BS+ Extremities: No signif edema B LE   CBC: Recent Labs  Lab 01/17/20 0407 01/17/20 0407 01/17/20 1451 01/18/20 0605 01/20/20 0407  WBC 6.7  --   --  6.1 5.9  HGB 6.9*   < > 9.5* 9.8* 9.3*  HCT 22.7*   < > 30.4* 31.1* 29.9*  MCV 88.7  --   --  84.3 84.7  PLT 171  --   --  163 156   < > = values in this interval not displayed.   Basic Metabolic Panel: Recent Labs  Lab 01/17/20 0407 01/18/20 0605 01/20/20 0407  NA 142 140 135  K 4.7 4.0 4.6  CL 112* 112* 110  CO2 20* 20* 19*  GLUCOSE 251* 118* 207*  BUN 44* 32* 44*  CREATININE 1.92* 1.66* 1.69*  CALCIUM 8.2* 8.4* 8.1*  MG 2.1 1.8 2.0   GFR: Estimated Creatinine Clearance: 30.5 mL/min (A) (by C-G formula based on SCr of 1.69 mg/dL (H)).  Liver Function Tests: Recent Labs  Lab 01/14/20 0514 01/16/20 1214 01/20/20 0407  AST 23  --  30  ALT 63*  --  38  ALKPHOS 72  --  51  BILITOT 0.5  --  0.5  PROT 5.4*  --  8.1  ALBUMIN 2.2* 2.7* 1.9*    Recent Labs  Lab 01/14/20 1044  AMMONIA 26    HbA1C: Hgb A1c MFr Bld  Date/Time Value Ref Range Status  01/14/2020 05:14 AM 7.1 (H) 4.8 - 5.6 % Final    Comment:    (NOTE) Pre diabetes:          5.7%-6.4%  Diabetes:              >6.4%  Glycemic control for   <7.0% adults with diabetes   09/28/2019 11:05 AM 6.1 (H) 4.8 - 5.6 % Final    Comment:             Prediabetes: 5.7 - 6.4          Diabetes: >6.4          Glycemic control for adults with diabetes: <7.0     CBG: Recent Labs  Lab 01/19/20 1556 01/19/20 2022 01/19/20 2338 01/20/20 0423 01/20/20 0735  GLUCAP 197* 199* 253* 179* 190*    Recent Results (from the past 240 hour(s))  MRSA PCR Screening     Status: None   Collection Time: 01/13/20  8:30 PM   Specimen: Nasal Mucosa; Nasopharyngeal  Result Value Ref Range Status   MRSA by PCR NEGATIVE NEGATIVE Final    Comment:        The GeneXpert MRSA Assay  (FDA approved for NASAL specimens only), is one component of a comprehensive MRSA colonization surveillance program. It is not intended to diagnose MRSA infection nor to guide or monitor treatment for MRSA infections. Performed at [facility], [place] [address] [place], [place] [address]   CSF culture     Status: None   Collection Time: 01/16/20 12:14 PM  Specimen: PATH Cytology CSF; Cerebrospinal Fluid  Result Value Ref Range Status   Specimen Description CSF  Final   Special Requests NONE  Final   Gram Stain   Final    WBC PRESENT,BOTH PMN AND MONONUCLEAR NO ORGANISMS SEEN CYTOSPIN SMEAR    Culture   Final    NO GROWTH Performed at [facility], [address] [place], [place] [address]    Report Status 01/19/2020 FINAL  Final     Scheduled Meds: . amantadine  100 mg Per Tube Daily  . chlorhexidine  15 mL Mouth Rinse BID  . docusate  100 mg Per Tube BID  . feeding supplement (PROSource TF)  45 mL Per Tube BID  . free water  150 mL Per Tube Q4H  . heparin  5,000 Units Subcutaneous Q8H  . insulin aspart  0-15 Units Subcutaneous Q4H  . insulin detemir  14 Units Subcutaneous QHS  . lidocaine  1 patch Transdermal Q24H  . mouth rinse  15 mL Mouth Rinse q12n4p  . metoprolol tartrate  50 mg Per Tube BID  . sennosides  5 mL Per Tube BID  . sodium chloride flush  3 mL Intravenous Q12H  . thiamine injection  100 mg Intravenous Daily   Continuous Infusions: . sodium chloride 250 mL (01/19/20 1445)  . feeding supplement (VITAL 1.5 CAL) 1,000 mL (01/19/20 1723)  . Immune Globulin 10% 30 g (01/19/20 2246)  . levETIRAcetam 500 mg (01/19/20 2140)     LOS: 7 days   Cherene Altes, MD [facility] Office  [phone] Pager - Text Page per Amion  If 7PM-7AM, please contact night-coverage per Amion 01/20/2020, 10:17 AM

## 2020-01-20 NOTE — Progress Notes (Signed)
Inpatient Diabetes Program Recommendations  AACE/ADA: New Consensus Statement on Inpatient Glycemic Control (2015)  Target Ranges:  Prepandial:   less than 140 mg/dL      Peak postprandial:   less than 180 mg/dL (1-2 hours)      Critically ill patients:  140 - 180 mg/dL   Lab Results  Component Value Date   GLUCAP 190 (H) 01/20/2020   HGBA1C 7.1 (H) 01/14/2020    Review of Glycemic Control Results for Miranda Roman, Miranda Roman (MRN [id number]) as of 01/20/2020 10:35  Ref. Range 01/19/2020 15:56 01/19/2020 20:22 01/19/2020 23:38 01/20/2020 04:23 01/20/2020 07:35  Glucose-Capillary Latest Ref Range: 70 - 99 mg/dL 197 (H) 199 (H) 253 (H) 179 (H) 190 (H)   Current orders for Inpatient glycemic control:  Levemir 14 units Novolog 0-15 units Q4 hours Novolog 4 units Q4 hours  Vital 1.5 Cal 55 ml/hour  Inpatient Diabetes Program Recommendations:    -  Add Novolog 3 units Q4 hours tube feed coverage.  Thanks,  Tama Headings RN, MSN, BC-ADM Inpatient Diabetes Coordinator Team Pager [phone] (8a-5p)

## 2020-01-21 DIAGNOSIS — G9340 Encephalopathy, unspecified: Secondary | ICD-10-CM | POA: Diagnosis not present

## 2020-01-21 LAB — GLUCOSE, CAPILLARY
Glucose-Capillary: 249 mg/dL — ABNORMAL HIGH (ref 70–99)
Glucose-Capillary: 214 mg/dL — ABNORMAL HIGH (ref 70–99)
Glucose-Capillary: 184 mg/dL — ABNORMAL HIGH (ref 70–99)
Glucose-Capillary: 228 mg/dL — ABNORMAL HIGH (ref 70–99)
Glucose-Capillary: 228 mg/dL — ABNORMAL HIGH (ref 70–99)
Glucose-Capillary: 195 mg/dL — ABNORMAL HIGH (ref 70–99)
Glucose-Capillary: 238 mg/dL — ABNORMAL HIGH (ref 70–99)

## 2020-01-21 MED ORDER — AMLODIPINE BESYLATE 10 MG PO TABS
10.0000 mg | ORAL_TABLET | Freq: Every day | ORAL | Status: DC
  Administered 2020-01-21 – 2020-02-08 (×18): 10 mg
  Filled 2020-01-21 (×20): qty 1

## 2020-01-21 MED ORDER — INSULIN ASPART 100 UNIT/ML ~~LOC~~ SOLN
2.0000 [IU] | SUBCUTANEOUS | Status: DC
  Administered 2020-01-21 – 2020-01-22 (×6): 2 [IU] via SUBCUTANEOUS

## 2020-01-21 NOTE — Progress Notes (Signed)
Miranda Roman  VWU:[id number] DOB: 08-Aug-1945 DOA: 01/13/2020 PCP: Glendale Chard, MD    Brief Narrative:  74 year old with a history of DM2, CKD stage IV, [place] trait, B thalasemia, and prior bariatric surgery who was transferred to [facility] from [facility] where she originally presented 12/21/2019 with complaints of chest pain.  A VQ scan and Dopplers were negative at that facility, and a cardiac work-up revealed only moderate pulmonary hypertension.  Her hospital course was complicated by the sudden onset of encephalopathy leukocytosis and fever.  ID and Neurology were consulted and the patient underwent MRI of the brain and a lumbar puncture, both of which were unrevealing.  EEG was unrevealing.  Her case was further complicated by anemia, thrombocytopenia, and acute kidney injury with consideration being given to possible TTP.  She was treated with 5 rounds of plasmapheresis and steroids without improvement.  Adam TS 13 was normal.  Bone marrow biopsy was performed.  At request of the family she was transferred to [facility].  Work-up after arrival to [facility] has included a negative long-term EEG, negative MRI brain, and negative repeat LP.  She had a repeat course of IVIG x5 days.  Antimicrobials:  None  Subjective: Less interactive today.  Appears comfortable.  No acute distress appreciable.  No new findings on exam.  Spoke with son and sister at bedside.  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 -MRI brain 7/11 negative -Continue Keppra 500 mg IV twice daily -Repeat LP 7/12 negative.  IVIG started 7/12 X 5 days -Neurology following  Loose stools Secondary  to tube feeding with no evidence of C. 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 Status post transfusion of 1U PRBC 7/13  -hemoglobin improved as expected and appears stable for now - follow intermittently   CKD stage V Baseline appears to be approximately 2.4 - creatinine presently better than suspected baseline at 1.66  HTN Blood pressure not at goal -Norvasc added today  Mild to moderate protein calorie malnutrition with dysphagia Not a candidate for PEG tube given history of gastric bypass -we will have to consider J-tube per general surgery if long-term tube feeds requirement  L1-L5 vertebral body/sacral sclerosis Noted per Hematology at [facility] -felt to be due to sickle cell trait  DVT prophylaxis: Subcutaneous heparin Code Status: FULL CODE Family Communication: As noted above Status is: Inpatient  Remains inpatient appropriate because:Inpatient level of care appropriate due to severity of illness   Dispo: The patient is from: Home              Anticipated d/c is to: CIR              Anticipated d/c date is: > 3 days              Patient currently is not medically stable to d/c.   Consultants:  Neurology  Objective: Blood pressure (!) 147/110, pulse (!) 106, temperature 99.7 F (37.6 C), temperature source Axillary, resp. rate 18, weight 76 kg, SpO2 100 %.  Intake/Output  Summary (Last 24 hours) at 01/21/2020 1042 Last data filed at 01/21/2020 0700 Gross per 24 hour  Intake 1928.93 ml  Output 1400 ml  Net 528.93 ml   Filed Weights   01/16/20 0444 01/19/20 0500 01/20/20 0500  Weight: 80.5 kg 77 kg 76 kg    Examination: General: NAD Lungs: CTA B  Cardiovascular: RRR Abdomen: NT/ND, soft, BS+ Extremities: No edema B LE   CBC: Recent Labs   Lab 01/17/20 0407 01/17/20 0407 01/17/20 1451 01/18/20 0605 01/20/20 0407  WBC 6.7  --   --  6.1 5.9  HGB 6.9*   < > 9.5* 9.8* 9.3*  HCT 22.7*   < > 30.4* 31.1* 29.9*  MCV 88.7  --   --  84.3 84.7  PLT 171  --   --  163 156   < > = values in this interval not displayed.   Basic Metabolic Panel: Recent Labs  Lab 01/17/20 0407 01/18/20 0605 01/20/20 0407  NA 142 140 135  K 4.7 4.0 4.6  CL 112* 112* 110  CO2 20* 20* 19*  GLUCOSE 251* 118* 207*  BUN 44* 32* 44*  CREATININE 1.92* 1.66* 1.69*  CALCIUM 8.2* 8.4* 8.1*  MG 2.1 1.8 2.0   GFR: Estimated Creatinine Clearance: 30.5 mL/min (A) (by C-G formula based on SCr of 1.69 mg/dL (H)).  Liver Function Tests: Recent Labs  Lab 01/16/20 1214 01/20/20 0407  AST  --  30  ALT  --  38  ALKPHOS  --  51  BILITOT  --  0.5  PROT  --  8.1  ALBUMIN 2.7* 1.9*    Recent Labs  Lab 01/14/20 1044  AMMONIA 26    HbA1C: Hgb A1c MFr Bld  Date/Time Value Ref Range Status  01/14/2020 05:14 AM 7.1 (H) 4.8 - 5.6 % Final    Comment:    (NOTE) Pre diabetes:          5.7%-6.4%  Diabetes:              >6.4%  Glycemic control for   <7.0% adults with diabetes   09/28/2019 11:05 AM 6.1 (H) 4.8 - 5.6 % Final    Comment:             Prediabetes: 5.7 - 6.4          Diabetes: >6.4          Glycemic control for adults with diabetes: <7.0     CBG: Recent Labs  Lab 01/20/20 1606 01/20/20 2027 01/21/20 0102 01/21/20 0455 01/21/20 0743  GLUCAP 226* 210* 249* 214* 184*    Recent Results (from the past 240 hour(s))  MRSA PCR Screening     Status: None   Collection Time: 01/13/20  8:30 PM   Specimen: Nasal Mucosa; Nasopharyngeal  Result Value Ref Range Status   MRSA by PCR NEGATIVE NEGATIVE Final    Comment:        The GeneXpert MRSA Assay (FDA approved for NASAL specimens only), is one component of a comprehensive MRSA colonization surveillance program. It is not intended to diagnose MRSA infection nor to guide  or monitor treatment for MRSA infections. Performed at [facility], [place] [address] [place], [place] [address]   CSF culture     Status: None   Collection Time: 01/16/20 12:14 PM   Specimen: PATH Cytology CSF; Cerebrospinal Fluid  Result Value Ref Range Status   Specimen Description CSF  Final   Special Requests NONE  Final  Gram Stain   Final    WBC PRESENT,BOTH PMN AND MONONUCLEAR NO ORGANISMS SEEN CYTOSPIN SMEAR    Culture   Final    NO GROWTH Performed at [facility], [address], [place], [place] [address]    Report Status 01/19/2020 FINAL  Final     Scheduled Meds: . chlorhexidine  15 mL Mouth Rinse BID  . docusate  100 mg Per Tube BID  . feeding supplement (PROSource TF)  45 mL Per Tube BID  . free water  150 mL Per Tube Q4H  . heparin  5,000 Units Subcutaneous Q8H  . insulin aspart  0-15 Units Subcutaneous Q4H  . insulin detemir  14 Units Subcutaneous QHS  . lidocaine  1 patch Transdermal Q24H  . mouth rinse  15 mL Mouth Rinse q12n4p  . metoprolol tartrate  100 mg Per Tube BID  . sennosides  5 mL Per Tube BID  . sodium chloride flush  3 mL Intravenous Q12H  . thiamine injection  100 mg Intravenous Daily   Continuous Infusions: . sodium chloride 250 mL (01/19/20 1445)  . feeding supplement (VITAL 1.5 CAL) 1,000 mL (01/21/20 1034)  . levETIRAcetam 500 mg (01/21/20 1023)     LOS: 8 days   Cherene Altes, MD [facility] Office  [phone] Pager - Text Page per Amion  If 7PM-7AM, please contact night-coverage per Amion 01/21/2020, 10:42 AM

## 2020-01-21 NOTE — Progress Notes (Signed)
Pt. Had an uneventful night. IVIG was given and she tolerated the administration well. No adverse signs or symptoms were noted during or after the completion of the med.  Pt remain awake throughout the night and finally drifted off about 530. I reached out to the on call around 1:30-2 am to see I I could get some melatonin. On call stated that it might be best to wait until she get closer to baseline. He stated that he would speak to the family if need be.

## 2020-01-22 DIAGNOSIS — E6609 Other obesity due to excess calories: Secondary | ICD-10-CM | POA: Diagnosis not present

## 2020-01-22 DIAGNOSIS — G9340 Encephalopathy, unspecified: Secondary | ICD-10-CM | POA: Diagnosis not present

## 2020-01-22 DIAGNOSIS — I10 Essential (primary) hypertension: Secondary | ICD-10-CM | POA: Diagnosis not present

## 2020-01-22 LAB — GLUCOSE, CAPILLARY
Glucose-Capillary: 185 mg/dL — ABNORMAL HIGH (ref 70–99)
Glucose-Capillary: 186 mg/dL — ABNORMAL HIGH (ref 70–99)
Glucose-Capillary: 240 mg/dL — ABNORMAL HIGH (ref 70–99)
Glucose-Capillary: 256 mg/dL — ABNORMAL HIGH (ref 70–99)
Glucose-Capillary: 259 mg/dL — ABNORMAL HIGH (ref 70–99)
Glucose-Capillary: 264 mg/dL — ABNORMAL HIGH (ref 70–99)

## 2020-01-22 MED ORDER — INSULIN ASPART 100 UNIT/ML ~~LOC~~ SOLN
4.0000 [IU] | SUBCUTANEOUS | Status: DC
  Administered 2020-01-22 – 2020-02-07 (×78): 4 [IU] via SUBCUTANEOUS

## 2020-01-22 NOTE — Progress Notes (Signed)
Neurology Progress Note   S:// Seen and examined.  No acute changes.   O:// Current vital signs: BP (!) 142/81 (BP Location: Left Arm)   Pulse 99   Temp 99.8 F (37.7 C) (Axillary)   Resp 17   Wt 76 kg   SpO2 100%   BMI 28.05 kg/m  Vital signs in last 24 hours: Temp:  [98.5 F (36.9 C)-101.2 F (38.4 C)] 99.8 F (37.7 C) (07/18 0725) Pulse Rate:  [88-101] 99 (07/18 0725) Resp:  [16-18] 17 (07/18 0725) BP: (116-142)/(65-81) 142/81 (07/18 0725) SpO2:  [99 %-100 %] 100 % (07/18 0725) General: Awake, alert in no distress.  At times goes off to sleep but awakens to voice. HEENT: Normocephalic atraumatic CVS: Regular rhythm Respiratory: Chest clear Abdomen: Nondistended nontender.  NG tube in place. Neurological exam Awake, alert, appears to look around but does not track purposefully. Pupils equal round reactive to light, extraocular movements intact, visual fields appear full to threat.  Face appears symmetric. Moves both upper extremities spontaneously. Withdraws both lower extremities to noxious stimulation weakly.  Grimaces to noxious stimulation Coordination difficult to assess   Medications  Current Facility-Administered Medications:  .  0.9 %  sodium chloride infusion, 250 mL, Intravenous, PRN, Christian, Rylee, MD, Last Rate: 10 mL/hr at 01/19/20 1445, 250 mL at 01/19/20 1445 .  acetaminophen (TYLENOL) tablet 650 mg, 650 mg, Per Tube, Q6H PRN, 650 mg at 01/17/20 0053 **OR** [DISCONTINUED] acetaminophen (TYLENOL) suppository 650 mg, 650 mg, Rectal, Q6H PRN, Pierce, Dwayne A, RPH .  amLODipine (NORVASC) tablet 10 mg, 10 mg, Per Tube, Daily, Cherene Altes, MD, 10 mg at 01/21/20 1158 .  chlorhexidine (PERIDEX) 0.12 % solution 15 mL, 15 mL, Mouth Rinse, BID, Agarwala, Ravi, MD, 15 mL at 01/21/20 2151 .  docusate (COLACE) 50 MG/5ML liquid 100 mg, 100 mg, Per Tube, BID, Agarwala, Ravi, MD, 100 mg at 01/21/20 2151 .  feeding supplement (PROSource TF) liquid 45 mL, 45  mL, Per Tube, BID, Amin, Ankit Chirag, MD, 45 mL at 01/21/20 2245 .  feeding supplement (VITAL 1.5 CAL) liquid 1,000 mL, 1,000 mL, Per Tube, Continuous, Amin, Ankit Chirag, MD, Last Rate: 55 mL/hr at 01/21/20 1034, 1,000 mL at 01/21/20 1034 .  free water 150 mL, 150 mL, Per Tube, Q4H, Cherene Altes, MD, 150 mL at 01/22/20 0829 .  heparin injection 5,000 Units, 5,000 Units, Subcutaneous, Q8H, Christian, Rylee, MD, 5,000 Units at 01/22/20 0840 .  insulin aspart (novoLOG) injection 0-15 Units, 0-15 Units, Subcutaneous, Q4H, Frederik Pear, MD, 3 Units at 01/22/20 [phone] .  insulin aspart (novoLOG) injection 2 Units, 2 Units, Subcutaneous, Q4H, Cherene Altes, MD, 2 Units at 01/22/20 [phone]  insulin detemir (LEVEMIR) injection 14 Units, 14 Units, Subcutaneous, QHS, Cherene Altes, MD, 14 Units at 01/21/20 2246 .  labetalol (NORMODYNE) injection 10-20 mg, 10-20 mg, Intravenous, Q2H PRN, Frederik Pear, MD, 20 mg at 01/18/20 0018 .  levETIRAcetam (KEPPRA) IVPB 500 mg/100 mL premix, 500 mg, Intravenous, Q12H, Blenda Nicely, [facility], Last Rate: 400 mL/hr at 01/21/20 2153, 500 mg at 01/21/20 2153 .  lidocaine (LIDODERM) 5 % 1 patch, 1 patch, Transdermal, Q24H, Amin, Ankit Chirag, MD, 1 patch at 01/21/20 1157 .  MEDLINE mouth rinse, 15 mL, Mouth Rinse, q12n4p, Agarwala, Ravi, MD, 15 mL at 01/21/20 1655 .  metoprolol tartrate (LOPRESSOR) tablet 100 mg, 100 mg, Per Tube, BID, Blount, Xenia T, NP, 100 mg at 01/21/20 2149 .  polyethylene glycol (MIRALAX /  GLYCOLAX) packet 17 g, 17 g, Per Tube, Daily PRN, Joselyn Glassman A, RPH .  senna-docusate (Senokot-S) tablet 2 tablet, 2 tablet, Per Tube, QHS PRN, Joselyn Glassman A, RPH .  sennosides (SENOKOT) 8.8 MG/5ML syrup 5 mL, 5 mL, Per Tube, BID, Agarwala, Ravi, MD, 5 mL at 01/21/20 2246 .  sodium chloride flush (NS) 0.9 % injection 3 mL, 3 mL, Intravenous, Q12H, Christian, Rylee, MD, 3 mL at 01/21/20 2246 .  sodium chloride flush (NS) 0.9 % injection 3 mL,  3 mL, Intravenous, PRN, Christian, Rylee, MD .  thiamine (B-1) injection 100 mg, 100 mg, Intravenous, Daily, Christian, Rylee, MD, 100 mg at 01/21/20 1042 Labs CBC    Component Value Date/Time   WBC 5.9 01/20/2020 0407   RBC 3.53 (L) 01/20/2020 0407   HGB 9.3 (L) 01/20/2020 0407   HGB 10.8 (L) 09/28/2019 1105   HCT 29.9 (L) 01/20/2020 0407   HCT 36.1 09/28/2019 1105   PLT 156 01/20/2020 0407   PLT 200 09/28/2019 1105   MCV 84.7 01/20/2020 0407   MCV 73 (L) 09/28/2019 1105   MCH 26.3 01/20/2020 0407   MCHC 31.1 01/20/2020 0407   RDW 19.4 (H) 01/20/2020 0407   RDW 17.1 (H) 09/28/2019 1105   LYMPHSABS 2.0 12/28/2018 1216   MONOABS 0.6 12/28/2018 1216   EOSABS 0.2 12/28/2018 1216   BASOSABS 0.0 12/28/2018 1216    CMP     Component Value Date/Time   NA 135 01/20/2020 0407   NA 143 09/28/2019 1105   K 4.6 01/20/2020 0407   CL 110 01/20/2020 0407   CO2 19 (L) 01/20/2020 0407   GLUCOSE 207 (H) 01/20/2020 0407   BUN 44 (H) 01/20/2020 0407   BUN 45 (H) 09/28/2019 1105   CREATININE 1.69 (H) 01/20/2020 0407   CALCIUM 8.1 (L) 01/20/2020 0407   CALCIUM 9.2 04/26/2019 0858   PROT 8.1 01/20/2020 0407   PROT 6.6 09/28/2019 1105   ALBUMIN 1.9 (L) 01/20/2020 0407   ALBUMIN 2.7 (L) 01/16/2020 1214   AST 30 01/20/2020 0407   ALT 38 01/20/2020 0407   ALKPHOS 51 01/20/2020 0407   BILITOT 0.5 01/20/2020 0407   BILITOT 0.4 09/28/2019 1105   GFRNONAA 30 (L) 01/20/2020 0407   GFRAA 35 (L) 01/20/2020 0407    glycosylated hemoglobin  Lipid Panel     Component Value Date/Time   CHOL 140 09/28/2019 1105   TRIG 74 09/28/2019 1105   HDL 51 09/28/2019 1105   CHOLHDL 2.7 09/28/2019 1105   LDLCALC 74 09/28/2019 1105     Imaging I have reviewed images in epic and the results pertinent to this consultation are: No new imaging to review  Assessment:  74 year old with relatively abrupt decline in her mentation in conjunction with chest pain and thrombocytopenia, seen in a hospital in  [place], some improvement with steroids and thiamine 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  Initially imaging not suggestive of Wernicke's.  Repeat imaging also not suggestive of Wernicke's. Initial LP had elevated protein at 67.  Repeat LP with protein of 49.  0 white cells. Received plasma exchange in [place] for possibly the TTP but showed no improvement in mentation.  Transferred here due to family being around. Completed IVIG today-5 rounds with the thought process that if this is a autoimmune encephalitis, plasma exchange is good at removing serum antibodies but not if there is any intrathecal production of antibodies.  IVIG might provide better efficacy. CSF autoimmune encephalopathy panel and paraneoplastic panel  might provide some yield-sample was collected prior to IVIG and has been sent to [facility].  Recommendations: At this time, I do not plan on plasma exchange or more IVIG. I would recommend clinically monitoring her. I had a discussion with the family in detail regarding this plan and explained that plasma exchange was not very helpful but IVIG-if the expected response should have a peak response in 2 to 4 weeks and she has just finished receiving the fifth dose yesterday. I would continue supportive care per primary team as you are. She does not seem to be in any distress, is able to protect her airway and is awake and looking around but is not following any commands.  Her level of consciousness has been improved since have seen her at the start of the week.  I expect the improvement to be slow but do not really have a good timeframe of how much of improvement to expect every day. I will be going off service tomorrow.  I will handoff her care to my oncoming colleagues and will also appreciate their input in her care. I relayed this plan to the family in detail as well.  Follow up autoimmune encephalopathy panel and paraneoplastic panel on  CSF.  -- Amie Portland, MD [facility] Pager: [phone] If 7pm to 7am, please call on call as listed on AMION.

## 2020-01-22 NOTE — Progress Notes (Signed)
Miranda Roman  AUQ:[id number] DOB: 26-Jan-1946 DOA: 01/13/2020 PCP: Glendale Chard, MD    Brief Narrative:  74 year old with a history of DM2, CKD stage IV, [place] trait, B thalasemia, and prior bariatric surgery who was transferred to [facility] from [facility] where she originally presented 12/21/2019 with complaints of chest pain.  A VQ scan and Dopplers were negative at that facility, and a cardiac work-up revealed only moderate pulmonary hypertension.  Her hospital course was complicated by the sudden onset of encephalopathy leukocytosis and fever.  ID and Neurology were consulted and the patient underwent MRI of the brain and a lumbar puncture, both of which were unrevealing.  EEG was unrevealing.  Her case was further complicated by anemia, thrombocytopenia, and acute kidney injury with consideration being given to possible TTP.  She was treated with 5 rounds of plasmapheresis and steroids without improvement.  Adam TS 13 was normal.  Bone marrow biopsy was performed.  At request of the family she was transferred to [facility].  Work-up after arrival to [facility] has included a negative long-term EEG, negative MRI brain, and negative repeat LP.  She had a repeat course of IVIG x5 days.  Antimicrobials:  None  Subjective: Discussed case with Dr. Malen Gauze.  No new developments today.  Patient appears slightly more alert at the time of my exam and is exhibiting some spontaneous movement of upper extremities and head.  Spoke with daughter at bedside at length.  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 -MRI brain 7/11 negative -Continue Keppra 500  mg IV twice daily -Repeat LP 7/12 negative.  IVIG started 7/12 X 5 days -Neurology following  Loose stools Secondary to tube feeding with no evidence of C. 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 Status post transfusion of 1U PRBC 7/13  -hemoglobin improved as expected and appears stable for now - follow intermittently   CKD stage V Baseline appears to be approximately 2.4 - creatinine presently better than suspected baseline  HTN Blood pressure trending downward with addition of Norvasc -follow without further change for today  Mild to moderate protein calorie malnutrition with dysphagia Not a candidate for PEG tube given history of gastric bypass -we will have to consider J-tube per general surgery if long-term tube feeds requirement  L1-L5 vertebral body/sacral sclerosis Noted per Hematology at [facility] -felt to be due to sickle cell trait  DVT prophylaxis: Subcutaneous heparin Code Status: FULL CODE Family Communication: As noted above Status is: Inpatient  Remains inpatient appropriate because:Inpatient level of care appropriate due to severity of illness   Dispo: The patient is from: Home              Anticipated d/c is to: CIR              Anticipated d/c date is: > 3 days              Patient currently is not medically stable to d/c.   Consultants:  Neurology  Objective:  Blood pressure (!) 142/81, pulse 99, temperature 99.8 F (37.7 C), temperature source Axillary, resp. rate 17, weight 76 kg, SpO2 100 %.  Intake/Output Summary (Last 24 hours) at 01/22/2020 1014 Last data filed at 01/22/2020 0647 Gross per 24 hour  Intake --  Output 2125 ml  Net -2125 ml   Filed Weights   01/16/20 0444 01/19/20 0500 01/20/20 0500  Weight: 80.5 kg 77 kg 76  kg    Examination: General: NAD Lungs: CTA B  Cardiovascular: RRR - no M Abdomen: NT/ND, soft, BS+ Extremities: No edema B LE   CBC: Recent Labs  Lab 01/17/20 0407 01/17/20 0407 01/17/20 1451 01/18/20 0605 01/20/20 0407  WBC 6.7  --   --  6.1 5.9  HGB 6.9*   < > 9.5* 9.8* 9.3*  HCT 22.7*   < > 30.4* 31.1* 29.9*  MCV 88.7  --   --  84.3 84.7  PLT 171  --   --  163 156   < > = values in this interval not displayed.   Basic Metabolic Panel: Recent Labs  Lab 01/17/20 0407 01/18/20 0605 01/20/20 0407  NA 142 140 135  K 4.7 4.0 4.6  CL 112* 112* 110  CO2 20* 20* 19*  GLUCOSE 251* 118* 207*  BUN 44* 32* 44*  CREATININE 1.92* 1.66* 1.69*  CALCIUM 8.2* 8.4* 8.1*  MG 2.1 1.8 2.0   GFR: Estimated Creatinine Clearance: 30.5 mL/min (A) (by C-G formula based on SCr of 1.69 mg/dL (H)).  Liver Function Tests: Recent Labs  Lab 01/16/20 1214 01/20/20 0407  AST  --  30  ALT  --  38  ALKPHOS  --  51  BILITOT  --  0.5  PROT  --  8.1  ALBUMIN 2.7* 1.9*    HbA1C: Hgb A1c MFr Bld  Date/Time Value Ref Range Status  01/14/2020 05:14 AM 7.1 (H) 4.8 - 5.6 % Final    Comment:    (NOTE) Pre diabetes:          5.7%-6.4%  Diabetes:              >6.4%  Glycemic control for   <7.0% adults with diabetes   09/28/2019 11:05 AM 6.1 (H) 4.8 - 5.6 % Final    Comment:             Prediabetes: 5.7 - 6.4          Diabetes: >6.4          Glycemic control for adults with diabetes: <7.0     CBG: Recent Labs  Lab 01/21/20 1659 01/21/20 2008 01/21/20 2327 01/22/20 0331 01/22/20 0742  GLUCAP 228* 195* 238* 256* 185*    Recent Results (from the past 240 hour(s))  MRSA PCR Screening     Status: None   Collection Time: 01/13/20  8:30 PM   Specimen: Nasal Mucosa; Nasopharyngeal  Result Value Ref Range Status   MRSA by PCR NEGATIVE NEGATIVE Final    Comment:        The GeneXpert MRSA Assay (FDA approved for NASAL specimens only), is one component of a comprehensive MRSA  colonization surveillance program. It is not intended to diagnose MRSA infection nor to guide or monitor treatment for MRSA infections. Performed at [facility], [place] [address] [place], [place] [address]   CSF culture     Status: None   Collection Time: 01/16/20 12:14 PM   Specimen: PATH Cytology CSF; Cerebrospinal Fluid  Result Value Ref Range  Status   Specimen Description CSF  Final   Special Requests NONE  Final   Gram Stain   Final    WBC PRESENT,BOTH PMN AND MONONUCLEAR NO ORGANISMS SEEN CYTOSPIN SMEAR    Culture   Final    NO GROWTH Performed at [facility], [address] [place], [place] [address]    Report Status 01/19/2020 FINAL  Final     Scheduled Meds: . amLODipine  10 mg Per Tube Daily  . chlorhexidine  15 mL Mouth Rinse BID  . docusate  100 mg Per Tube BID  . feeding supplement (PROSource TF)  45 mL Per Tube BID  . free water  150 mL Per Tube Q4H  . heparin  5,000 Units Subcutaneous Q8H  . insulin aspart  0-15 Units Subcutaneous Q4H  . insulin aspart  2 Units Subcutaneous Q4H  . insulin detemir  14 Units Subcutaneous QHS  . lidocaine  1 patch Transdermal Q24H  . mouth rinse  15 mL Mouth Rinse q12n4p  . metoprolol tartrate  100 mg Per Tube BID  . sennosides  5 mL Per Tube BID  . sodium chloride flush  3 mL Intravenous Q12H  . thiamine injection  100 mg Intravenous Daily   Continuous Infusions: . sodium chloride 250 mL (01/19/20 1445)  . feeding supplement (VITAL 1.5 CAL) 1,000 mL (01/21/20 1034)  . levETIRAcetam 500 mg (01/21/20 2153)     LOS: 9 days   Cherene Altes, MD [facility] Office  [phone] Pager - Text Page per Amion  If 7PM-7AM, please contact night-coverage per Amion 01/22/2020, 10:14 AM

## 2020-01-23 DIAGNOSIS — G9340 Encephalopathy, unspecified: Secondary | ICD-10-CM | POA: Diagnosis not present

## 2020-01-23 LAB — GLUCOSE, CAPILLARY
Glucose-Capillary: 190 mg/dL — ABNORMAL HIGH (ref 70–99)
Glucose-Capillary: 200 mg/dL — ABNORMAL HIGH (ref 70–99)
Glucose-Capillary: 217 mg/dL — ABNORMAL HIGH (ref 70–99)
Glucose-Capillary: 219 mg/dL — ABNORMAL HIGH (ref 70–99)
Glucose-Capillary: 262 mg/dL — ABNORMAL HIGH (ref 70–99)
Glucose-Capillary: 269 mg/dL — ABNORMAL HIGH (ref 70–99)

## 2020-01-23 MED ORDER — LEVETIRACETAM 100 MG/ML PO SOLN
500.0000 mg | Freq: Two times a day (BID) | ORAL | Status: DC
  Administered 2020-01-24 – 2020-01-25 (×3): 500 mg
  Filled 2020-01-23 (×3): qty 5

## 2020-01-23 MED ORDER — INSULIN DETEMIR 100 UNIT/ML ~~LOC~~ SOLN
16.0000 [IU] | Freq: Every day | SUBCUTANEOUS | Status: DC
  Administered 2020-01-23: 16 [IU] via SUBCUTANEOUS
  Filled 2020-01-23 (×2): qty 0.16

## 2020-01-23 MED ORDER — THIAMINE HCL 100 MG PO TABS
100.0000 mg | ORAL_TABLET | Freq: Every day | ORAL | Status: DC
  Administered 2020-01-23 – 2020-02-08 (×16): 100 mg
  Filled 2020-01-23 (×17): qty 1

## 2020-01-23 NOTE — Progress Notes (Signed)
Occupational Therapy Treatment Patient Details Name: Miranda Roman MRN: [id number] DOB: 11-17-1945 Today's Date: 01/23/2020    History of present illness Pateint is a 74 year old female. 3 weeks prior she injured her foot tubing on vacation. The next day she began to have chest pain and quickly developed acute encephalopathy. She was flown from [place] (on vacation) to [place] where she lives. 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 Pt initially lethargic upon arrival but did arouse with stimulation of gospel music. Pt unable to purposefully follow commands during session. Pt required total A to complete oral care from bed level. Pt required max  tactile cues to open pts mouth, but once pt opened mouth, pt was able to keep mouth open and allow therapist and daughter to complete oral care. Pt spontaneously attempting to lift BUEs,  although noted no more than trace active muscle movement. Pt would continue to benefit from skilled occupational therapy while admitted and after d/c to address the below listed limitations in order to improve overall functional mobility and facilitate independence with BADL participation. DC plan remains appropriate, will follow acutely per POC.     Follow Up Recommendations  LTACH    Equipment Recommendations  None recommended by OT    Recommendations for Other Services      Precautions / Restrictions Precautions Precautions: None Restrictions Weight Bearing Restrictions: No       Mobility Bed Mobility               General bed mobility comments: defer for safety  Transfers                      Balance                                           ADL either performed or assessed with clinical judgement   ADL Overall ADL's : Needs assistance/impaired     Grooming: Wash/dry face;Total  assistance;Bed level;Oral care Grooming Details (indicate cue type and reason): total A to wash face from supine despite max multimodal cues and hand over hand assist. total A to complete oral care from bed level                               General ADL Comments: session focus on following commands and attention to task     Vision   Additional Comments: pt will attend to stimulus and track but not following commands to complete visual assessment   Perception     Praxis      Cognition Arousal/Alertness: Awake/alert;Lethargic (moments of wakefulness and moments of lethargy) Behavior During Therapy: Flat affect Overall Cognitive Status: Impaired/Different from baseline Area of Impairment: Attention;Following commands                   Current Attention Level: Focused   Following Commands: Follows one step commands inconsistently       General Comments: with max tacile cues and  stimulation of music pt allowed therapists to complete oral care. provided max multimodal cues to get pt to follow simple one step commands with no success        Exercises Other Exercises Other Exercises: PROM to LUE; noted increased  tone during elbow flexion/ extension   Shoulder Instructions       General Comments      Pertinent Vitals/ Pain       Pain Assessment: Faces Faces Pain Scale: No hurt  Home Living                                          Prior Functioning/Environment              Frequency  Min 2X/week        Progress Toward Goals  OT Goals(current goals can now be found in the care plan section)  Progress towards OT goals: Not progressing toward goals - comment (not following commands)  Acute Rehab OT Goals Patient Stated Goal: pt unable to state.  Family is hoping that she will improve and be able to engage and be as independent as possible  OT Goal Formulation: With patient Time For Goal Achievement: 02/03/20 Potential to  Achieve Goals: [place] Discharge plan remains appropriate;Frequency remains appropriate    Co-evaluation                 AM-PAC OT "6 Clicks" Daily Activity     Outcome Measure   Help from another person eating meals?: Total Help from another person taking care of personal grooming?: Total Help from another person toileting, which includes using toliet, bedpan, or urinal?: Total Help from another person bathing (including washing, rinsing, drying)?: Total Help from another person to put on and taking off regular upper body clothing?: Total Help from another person to put on and taking off regular lower body clothing?: Total 6 Click Score: 6    End of Session    OT Visit Diagnosis: Cognitive communication deficit (R41.841)   Activity Tolerance Patient limited by lethargy;Other (comment) (decreased ability to follow commands)   Patient Left in bed;with call bell/phone within reach;with bed alarm set;with family/visitor present   Nurse Communication Mobility status        Time: 5176-1607 OT Time Calculation (min): 20 min  Charges: OT General Charges $OT Visit: 1 Visit OT Treatments $Self Care/Home Management : 8-22 mins  Lanier Clam., COTA/L Acute Rehabilitation Services [phone]    Ihor Gully 01/23/2020, 4:07 PM

## 2020-01-23 NOTE — Progress Notes (Signed)
01/23/20 1644  PT Visit Information  Last PT Received On 01/23/20  Assistance Needed +2  History of Present Illness Pateint is a 74 year old female. 3 weeks prior she injured her foot tubing on vacation. The next day she began to have chest pain and quickly developed acute encephalopathy. She was flown from [place] (on vacation) to [place] where she lives. 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  Family is hoping that she will improve and be able to engage and be as independent as possible   Precautions  Precautions Fall;Other (comment)  Precaution Comments feeding tube  Restrictions  Weight Bearing Restrictions No  Pain Assessment  Pain Assessment Faces  Faces Pain Scale 0  Cognition  Arousal/Alertness Awake/alert  Behavior During Therapy Flat affect  Overall Cognitive Status Impaired/Different from baseline  Area of Impairment Attention;Following commands;Problem solving  Current Attention Level Focused  Following Commands Follows one step commands inconsistently  Problem Solving Slow processing;Difficulty sequencing;Decreased initiation  General Comments Pt unable to follow simple one step commands. Was able to briefly look at PT when PT called pt's name.   Bed Mobility  Overal bed mobility Needs Assistance  Bed Mobility Sit to Supine;Supine to Sit  Supine to sit Total assist;+2 for physical assistance  Sit to supine Total assist;+2 for physical assistance  General bed mobility comments Total A +2 for LE and trunk assist. In sitting, pt unable to maintain balance requiring total A.   Transfers  General transfer comment unable to safely attempt   Balance  Overall balance assessment Needs assistance  Sitting-balance support No upper extremity supported;Feet supported  Sitting balance-Leahy Scale Zero  Sitting balance - Comments Total A to maintain sitting balance. Was  able to hold head up for longer period this session   General Comments  General comments (skin integrity, edema, etc.) Pt's family present during session   Exercises  Exercises Other exercises  Other Exercises  Other Exercises Worked on lateral weightshifting onto bilateral elbows in sitting X3. Total A +2 required.   Other Exercises Worked on anterior weight shift of trunk X3 in sitting. Total A required   Other Exercises PROM LAQ in sitting X3 bilaterally.   PT - End of Session  Activity Tolerance Patient limited by fatigue  Patient left in bed;with call bell/phone within reach;with bed alarm set;with family/visitor present;with nursing/sitter in room  Nurse Communication Mobility status   PT - Assessment/Plan  PT Plan Current plan remains appropriate  PT Visit Diagnosis Other abnormalities of gait and mobility (R26.89);Muscle weakness (generalized) (M62.81);Other symptoms and signs involving the nervous system (R29.898)  PT Frequency (ACUTE ONLY) Min 2X/week  Follow Up Recommendations LTACH;SNF  PT equipment None recommended by PT  AM-PAC PT "6 Clicks" Mobility Outcome Measure (Version 2)  Help needed turning from your back to your side while in a flat bed without using bedrails? 1  Help needed moving from lying on your back to sitting on the side of a flat bed without using bedrails? 1  Help needed moving to and from a bed to a chair (including a wheelchair)? 1  Help needed standing up from a chair using your arms (e.g., wheelchair or bedside chair)? 1  Help needed to walk in hospital room? 1  Help needed climbing 3-5 steps with a railing?  1  6 Click Score 6  Consider Recommendation of Discharge To: CIR/SNF/LTACH  PT Goal Progression  Progress towards  PT goals Progressing toward goals (slowly)  Acute Rehab PT Goals  PT Goal Formulation With family  Time For Goal Achievement 01/21/20  PT Time Calculation  PT Start Time (ACUTE ONLY) 1412  PT Stop Time (ACUTE ONLY) 1432  PT  Time Calculation (min) (ACUTE ONLY) 20 min  PT General Charges  $$ ACUTE PT VISIT 1 Visit  PT Treatments  $Therapeutic Activity 8-22 mins   Pt with slow progression. Required total A +2 to sit at EOB this session. Did show improvement in ability to hold head into extension this session. Kept eyes open throughout. Was unable to follow simple one step commands this session. Current recommendations appropriate. Will continue to follow acutely to maximize functional mobility independence and safety.   Reuel Derby, PT, DPT  Acute Rehabilitation Services  Pager: [phone] Office: [phone]

## 2020-01-23 NOTE — Progress Notes (Signed)
Miranda Roman  HYQ:[id number] DOB: [birth year] DOA: 01/13/2020 PCP: Glendale Chard, MD    Brief Narrative:  74 year old with a history of DM2, CKD stage IV, [place] trait, B thalasemia, and prior bariatric surgery who was transferred to [facility] from [facility] where she originally presented 12/21/2019 with complaints of chest pain.  A VQ scan and Dopplers were negative at that facility, and a cardiac work-up revealed only moderate pulmonary hypertension.  Her hospital course was complicated by the sudden onset of encephalopathy leukocytosis and fever.  ID and Neurology were consulted and the patient underwent MRI of the brain and a lumbar puncture, both of which were unrevealing.  EEG was unrevealing.  Her case was further complicated by anemia, thrombocytopenia, and acute kidney injury with consideration being given to possible TTP.  She was treated with 5 rounds of plasmapheresis and steroids without improvement.  Adam TS 13 was normal.  Bone marrow biopsy was performed.  At request of the family she was transferred to [facility].  Work-up after arrival to [facility] has included a negative long-term EEG, negative MRI brain, and negative repeat LP.  She had a repeat course of IVIG x5 days.  Antimicrobials:  None  Subjective: Sleeping at time of my exam.  Family reports she has been sleeping more of the last 24 hours.  No evidence of distress.  Spoke to daughter at bedside and son via telephone.  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 -MRI brain 7/11 negative -Continue Keppra 500 mg IV twice daily -Repeat LP 7/12 negative.  IVIG started 7/12 X  5 days -Neurology following  Loose stools Secondary to tube feeding with no evidence of C. 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 Status post transfusion of 1U PRBC 7/13  -hemoglobin improved as expected and appears stable for now - follow intermittently   CKD stage V Baseline appears to be approximately 2.4 - creatinine presently better than suspected baseline  HTN Blood pressure now at goal  Mild to moderate protein calorie malnutrition with dysphagia Not a candidate for PEG tube given history of gastric bypass -we will have to consider J-tube per general surgery if long-term tube feeds requirement  L1-L5 vertebral body/sacral sclerosis Noted per Hematology at [facility] -felt to be due to sickle cell trait  DVT prophylaxis: Subcutaneous heparin Code Status: FULL CODE Family Communication: As noted above Status is: Inpatient  Remains inpatient appropriate because:Inpatient level of care appropriate due to severity of illness   Dispo: The patient is from: Home              Anticipated d/c is to: CIR              Anticipated d/c date is: > 3 days              Patient currently is not medically stable to d/c.   Consultants:  Neurology  Objective: Blood pressure 123/70, pulse (!) 104, temperature 99.1 F (37.3 C), temperature source Axillary, resp. rate 20,  weight 76 kg, SpO2 100 %.  Intake/Output Summary (Last 24 hours) at 01/23/2020 0922 Last data filed at 01/23/2020 0629 Gross per 24 hour  Intake 1319.3 ml  Output 700 ml  Net 619.3 ml   Filed Weights   01/16/20 0444 01/19/20 0500 01/20/20 0500  Weight: 80.5 kg 77 kg 76 kg    Examination: General: NAD -sleeping Lungs: CTA B  Cardiovascular: RRR  Abdomen: NT/ND, soft, BS+ Extremities:  No edema B LE   CBC: Recent Labs  Lab 01/17/20 0407 01/17/20 0407 01/17/20 1451 01/18/20 0605 01/20/20 0407  WBC 6.7  --   --  6.1 5.9  HGB 6.9*   < > 9.5* 9.8* 9.3*  HCT 22.7*   < > 30.4* 31.1* 29.9*  MCV 88.7  --   --  84.3 84.7  PLT 171  --   --  163 156   < > = values in this interval not displayed.   Basic Metabolic Panel: Recent Labs  Lab 01/17/20 0407 01/18/20 0605 01/20/20 0407  NA 142 140 135  K 4.7 4.0 4.6  CL 112* 112* 110  CO2 20* 20* 19*  GLUCOSE 251* 118* 207*  BUN 44* 32* 44*  CREATININE 1.92* 1.66* 1.69*  CALCIUM 8.2* 8.4* 8.1*  MG 2.1 1.8 2.0   GFR: Estimated Creatinine Clearance: 30.5 mL/min (A) (by C-G formula based on SCr of 1.69 mg/dL (H)).  Liver Function Tests: Recent Labs  Lab 01/16/20 1214 01/20/20 0407  AST  --  30  ALT  --  38  ALKPHOS  --  51  BILITOT  --  0.5  PROT  --  8.1  ALBUMIN 2.7* 1.9*    HbA1C: Hgb A1c MFr Bld  Date/Time Value Ref Range Status  01/14/2020 05:14 AM 7.1 (H) 4.8 - 5.6 % Final    Comment:    (NOTE) Pre diabetes:          5.7%-6.4%  Diabetes:              >6.4%  Glycemic control for   <7.0% adults with diabetes   09/28/2019 11:05 AM 6.1 (H) 4.8 - 5.6 % Final    Comment:             Prediabetes: 5.7 - 6.4          Diabetes: >6.4          Glycemic control for adults with diabetes: <7.0     CBG: Recent Labs  Lab 01/22/20 1652 01/22/20 2035 01/22/20 2336 01/23/20 0435 01/23/20 0801  GLUCAP 186* 259* 264* 200* 217*    Recent Results (from the past 240 hour(s))  MRSA PCR Screening     Status: None   Collection Time: 01/13/20  8:30 PM   Specimen: Nasal Mucosa; Nasopharyngeal  Result Value Ref Range Status   MRSA by PCR NEGATIVE NEGATIVE Final    Comment:        The GeneXpert MRSA Assay (FDA approved for NASAL specimens only), is one component of a comprehensive MRSA colonization surveillance program. It is not intended to diagnose MRSA infection nor to guide or monitor treatment  for MRSA infections. Performed at [facility], [place] [address], [place], [place] [address]   CSF culture     Status: None   Collection Time: 01/16/20 12:14 PM   Specimen: PATH Cytology CSF; Cerebrospinal Fluid  Result Value Ref Range Status   Specimen Description CSF  Final   Special Requests NONE  Final  Gram Stain   Final    WBC PRESENT,BOTH PMN AND MONONUCLEAR NO ORGANISMS SEEN CYTOSPIN SMEAR    Culture   Final    NO GROWTH Performed at [facility], [address], [place], [place] [address]    Report Status 01/19/2020 FINAL  Final     Scheduled Meds: . amLODipine  10 mg Per Tube Daily  . chlorhexidine  15 mL Mouth Rinse BID  . docusate  100 mg Per Tube BID  . feeding supplement (PROSource TF)  45 mL Per Tube BID  . free water  150 mL Per Tube Q4H  . heparin  5,000 Units Subcutaneous Q8H  . insulin aspart  0-15 Units Subcutaneous Q4H  . insulin aspart  4 Units Subcutaneous Q4H  . insulin detemir  14 Units Subcutaneous QHS  . lidocaine  1 patch Transdermal Q24H  . mouth rinse  15 mL Mouth Rinse q12n4p  . metoprolol tartrate  100 mg Per Tube BID  . sennosides  5 mL Per Tube BID  . sodium chloride flush  3 mL Intravenous Q12H  . thiamine injection  100 mg Intravenous Daily   Continuous Infusions: . sodium chloride 250 mL (01/19/20 1445)  . feeding supplement (VITAL 1.5 CAL) 1,000 mL (01/21/20 1034)  . levETIRAcetam 500 mg (01/23/20 0901)     LOS: 10 days   Cherene Altes, MD [facility] Office  [phone] Pager - Text Page per Amion  If 7PM-7AM, please contact night-coverage per Amion 01/23/2020, 9:22 AM

## 2020-01-24 DIAGNOSIS — G9340 Encephalopathy, unspecified: Secondary | ICD-10-CM | POA: Diagnosis not present

## 2020-01-24 LAB — MISC LABCORP TEST (SEND OUT)
Labcorp test code: 9986
Labcorp test code: 9986

## 2020-01-24 LAB — COMPREHENSIVE METABOLIC PANEL
ALT: 34 U/L (ref 0–44)
AST: 25 U/L (ref 15–41)
Albumin: 1.9 g/dL — ABNORMAL LOW (ref 3.5–5.0)
Alkaline Phosphatase: 50 U/L (ref 38–126)
Anion gap: 6 (ref 5–15)
BUN: 79 mg/dL — ABNORMAL HIGH (ref 8–23)
CO2: 19 mmol/L — ABNORMAL LOW (ref 22–32)
Calcium: 8.4 mg/dL — ABNORMAL LOW (ref 8.9–10.3)
Chloride: 111 mmol/L (ref 98–111)
Creatinine, Ser: 2.23 mg/dL — ABNORMAL HIGH (ref 0.44–1.00)
GFR calc Af Amer: 25 mL/min — ABNORMAL LOW (ref >60)
GFR calc non Af Amer: 21 mL/min — ABNORMAL LOW (ref >60)
Glucose, Bld: 257 mg/dL — ABNORMAL HIGH (ref 70–99)
Potassium: 5.7 mmol/L — ABNORMAL HIGH (ref 3.5–5.1)
Sodium: 136 mmol/L (ref 135–145)
Total Bilirubin: 0.7 mg/dL (ref 0.3–1.2)
Total Protein: 7.4 g/dL (ref 6.5–8.1)

## 2020-01-24 LAB — GLUCOSE, CAPILLARY
Glucose-Capillary: 229 mg/dL — ABNORMAL HIGH (ref 70–99)
Glucose-Capillary: 243 mg/dL — ABNORMAL HIGH (ref 70–99)
Glucose-Capillary: 228 mg/dL — ABNORMAL HIGH (ref 70–99)
Glucose-Capillary: 237 mg/dL — ABNORMAL HIGH (ref 70–99)
Glucose-Capillary: 225 mg/dL — ABNORMAL HIGH (ref 70–99)
Glucose-Capillary: 237 mg/dL — ABNORMAL HIGH (ref 70–99)

## 2020-01-24 LAB — CBC
HCT: 27.3 % — ABNORMAL LOW (ref 36.0–46.0)
Hemoglobin: 8.5 g/dL — ABNORMAL LOW (ref 12.0–15.0)
MCH: 26.2 pg (ref 26.0–34.0)
MCHC: 31.1 g/dL (ref 30.0–36.0)
MCV: 84.3 fL (ref 80.0–100.0)
Platelets: 216 10*3/uL (ref 150–400)
RBC: 3.24 MIL/uL — ABNORMAL LOW (ref 3.87–5.11)
RDW: 18.8 % — ABNORMAL HIGH (ref 11.5–15.5)
WBC: 6.3 10*3/uL (ref 4.0–10.5)
nRBC: 0 % (ref 0.0–0.2)

## 2020-01-24 LAB — VITAMIN B12: Vitamin B-12: 634 pg/mL (ref 180–914)

## 2020-01-24 LAB — PHOSPHORUS: Phosphorus: 5.1 mg/dL — ABNORMAL HIGH (ref 2.5–4.6)

## 2020-01-24 LAB — TSH: TSH: 1.815 u[IU]/mL (ref 0.350–4.500)

## 2020-01-24 LAB — FOLATE: Folate: 12.8 ng/mL (ref >5.9)

## 2020-01-24 LAB — MAGNESIUM: Magnesium: 2.7 mg/dL — ABNORMAL HIGH (ref 1.7–2.4)

## 2020-01-24 MED ORDER — DOCUSATE SODIUM 50 MG/5ML PO LIQD: 100.0000 mg | Freq: Two times a day (BID) | ORAL | Status: DC | PRN

## 2020-01-24 MED ORDER — INSULIN DETEMIR 100 UNIT/ML ~~LOC~~ SOLN
12.0000 [IU] | Freq: Two times a day (BID) | SUBCUTANEOUS | Status: DC
  Administered 2020-01-24 – 2020-02-15 (×42): 12 [IU] via SUBCUTANEOUS
  Filled 2020-01-24 (×45): qty 0.12

## 2020-01-24 MED ORDER — INSULIN DETEMIR 100 UNIT/ML ~~LOC~~ SOLN
6.0000 [IU] | Freq: Once | SUBCUTANEOUS | Status: AC
  Administered 2020-01-24: 6 [IU] via SUBCUTANEOUS
  Filled 2020-01-24: qty 0.06

## 2020-01-24 NOTE — Progress Notes (Signed)
Miranda Roman  FVC:[id number] DOB: [birth year] DOA: 01/13/2020 PCP: Glendale Chard, MD    Brief Narrative:  74 year old with a history of DM2, CKD stage IV, [place] trait, B thalasemia, and prior bariatric surgery who was transferred to [facility] from [facility] where she originally presented 12/21/2019 with complaints of chest pain.  A VQ scan and Dopplers were negative at that facility, and a cardiac work-up revealed only moderate pulmonary hypertension.  Her hospital course was complicated by the sudden onset of encephalopathy leukocytosis and fever.  ID and Neurology were consulted and the patient underwent MRI of the brain and a lumbar puncture, both of which were unrevealing.  EEG was unrevealing.  Her case was further complicated by anemia, thrombocytopenia, and acute kidney injury with consideration being given to possible TTP.  She was treated with 5 rounds of plasmapheresis and steroids without improvement.  Adam TS 13 was normal.  Bone marrow biopsy was performed.  At request of the family she was transferred to [facility].  Work-up after arrival to [facility] has included a negative long-term EEG, negative MRI brain, and negative repeat LP.  She had a repeat course of IVIG x5 days.  Antimicrobials:  None  Subjective: The patient will open her eyes to my voice.  She does not appear to be in distress.  She does not follow simple commands.  She does appear to follow the examiner with her eyes.  I spoke with her RN at bedside who reports no acute findings today.  I have spoken to the family every day that I visited the patient but today there was no one present in the room at the time of my exam.  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 -MRI brain 7/11 negative -Continue Keppra 500 mg IV twice daily -Repeat LP 7/12 negative.  IVIG started 7/12 X 5 days -Neurology following and has no further recs at this time, expecting another 5-6 days will be required to see any effect from recently completed IVIG course  Loose stools Secondary to tube feeding with no evidence of C. 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 Status post transfusion of 1U PRBC 7/13  -hemoglobin improved as expected initially and is slowly trending down - no indication for transfusion at present - follow intermittently   CKD stage V Baseline appears to be approximately 2.4 - creatinine fluctuating within her baseline range   HTN Blood pressure now at goal  Mild to moderate protein calorie malnutrition with dysphagia Not a candidate for PEG tube given history of gastric bypass -we will have to consider J-tube per general surgery if long-term tube feeds requirement - tolerating soft Coretrak tube for now   L1-L5 vertebral body/sacral sclerosis Noted per Hematology at [facility] -felt to be due to sickle cell trait  DVT prophylaxis: Subcutaneous heparin Code Status: FULL CODE Family Communication: As noted above Status is: Inpatient  Remains inpatient appropriate because:Inpatient level of care appropriate due  to severity of illness   Dispo: The patient is from: Home              Anticipated d/c is to: LTAC v/s SNF depending on progress              Anticipated d/c date is: > 3 days              Patient currently is not medically stable to d/c.   Consultants:   Neurology  Objective: Blood pressure 123/60, pulse (!) 101, temperature 99.2 F (37.3 C), temperature source Axillary, resp. rate 20, weight 76 kg, SpO2 100 %.  Intake/Output Summary (Last 24 hours) at 01/24/2020 1017 Last data filed at 01/24/2020 0627 Gross per 24 hour  Intake 1632.67 ml  Output 150 ml  Net 1482.67 ml   Filed Weights   01/16/20 0444 01/19/20 0500 01/20/20 0500  Weight: 80.5 kg 77 kg 76 kg    Examination: General: NAD  Lungs: CTA B -no wheezing or crackles Cardiovascular: RRR  Abdomen: NT/ND, soft, BS+ Extremities: No edema B lower extremities  CBC: Recent Labs  Lab 01/18/20 0605 01/20/20 0407 01/24/20 0407  WBC 6.1 5.9 6.3  HGB 9.8* 9.3* 8.5*  HCT 31.1* 29.9* 27.3*  MCV 84.3 84.7 84.3  PLT 163 156 216   Basic Metabolic Panel: Recent Labs  Lab 01/18/20 0605 01/20/20 0407 01/24/20 0407  NA 140 135 136  K 4.0 4.6 5.7*  CL 112* 110 111  CO2 20* 19* 19*  GLUCOSE 118* 207* 257*  BUN 32* 44* 79*  CREATININE 1.66* 1.69* 2.23*  CALCIUM 8.4* 8.1* 8.4*  MG 1.8 2.0 2.7*  PHOS  --   --  5.1*   GFR: Estimated Creatinine Clearance: 23.1 mL/min (A) (by C-G formula based on SCr of 2.23 mg/dL (H)).  Liver Function Tests: Recent Labs  Lab 01/20/20 0407 01/24/20 0407  AST 30 25  ALT 38 34  ALKPHOS 51 50  BILITOT 0.5 0.7  PROT 8.1 7.4  ALBUMIN 1.9* 1.9*    HbA1C: Hgb A1c MFr Bld  Date/Time Value Ref Range Status  01/14/2020 05:14 AM 7.1 (H) 4.8 - 5.6 % Final    Comment:    (NOTE) Pre diabetes:          5.7%-6.4%  Diabetes:              >6.4%  Glycemic control for   <7.0% adults with diabetes   09/28/2019 11:05 AM 6.1 (H) 4.8 - 5.6 % Final    Comment:             Prediabetes: 5.7 - 6.4          Diabetes: >6.4          Glycemic control for adults with diabetes: <7.0     CBG: Recent Labs  Lab 01/23/20 1629 01/23/20 2035 01/23/20 2358 01/24/20 0337 01/24/20 0803  GLUCAP 219* 262* 269* 229* 243*    Recent Results (from the  past 240 hour(s))  CSF culture     Status: None   Collection Time: 01/16/20 12:14 PM   Specimen: PATH Cytology CSF; Cerebrospinal Fluid  Result Value Ref Range Status   Specimen Description CSF  Final   Special Requests NONE  Final   Gram Stain   Final    WBC PRESENT,BOTH PMN AND MONONUCLEAR NO ORGANISMS SEEN CYTOSPIN SMEAR    Culture   Final    NO GROWTH Performed at [facility], [address] [place],   [place] [address]    Report Status 01/19/2020 FINAL  Final     Scheduled Meds: . amLODipine  10 mg Per Tube Daily  . chlorhexidine  15 mL Mouth Rinse BID  . docusate  100 mg Per Tube BID  . feeding supplement (PROSource TF)  45 mL Per Tube BID  . free water  150 mL Per Tube Q4H  . heparin  5,000 Units Subcutaneous Q8H  . insulin aspart  0-15 Units Subcutaneous Q4H  . insulin aspart  4 Units Subcutaneous Q4H  . insulin detemir  16 Units Subcutaneous QHS  . levETIRAcetam  500 mg Per Tube BID  . lidocaine  1 patch Transdermal Q24H  . mouth rinse  15 mL Mouth Rinse q12n4p  . metoprolol tartrate  100 mg Per Tube BID  . sennosides  5 mL Per Tube BID  . sodium chloride flush  3 mL Intravenous Q12H  . thiamine  100 mg Per Tube Daily   Continuous Infusions: . sodium chloride 250 mL (01/19/20 1445)  . feeding supplement (VITAL 1.5 CAL) 1,000 mL (01/21/20 1034)     LOS: 11 days    T. , MD [facility] Office  [phone] Pager - Text Page per Amion  If 7PM-7AM, please contact night-coverage per Amion 01/24/2020, 10:17 AM     

## 2020-01-24 NOTE — Plan of Care (Signed)
  Problem: Clinical Measurements: Goal: Will remain free from infection Outcome: Progressing Goal: Cardiovascular complication will be avoided Outcome: Progressing   Problem: Elimination: Goal: Will not experience complications related to urinary retention Outcome: Progressing   Problem: Pain Managment: Goal: General experience of comfort will improve Outcome: Progressing

## 2020-01-25 DIAGNOSIS — G9340 Encephalopathy, unspecified: Secondary | ICD-10-CM | POA: Diagnosis not present

## 2020-01-25 DIAGNOSIS — E1122 Type 2 diabetes mellitus with diabetic chronic kidney disease: Secondary | ICD-10-CM | POA: Diagnosis not present

## 2020-01-25 DIAGNOSIS — Z6831 Body mass index (BMI) 31.0-31.9, adult: Secondary | ICD-10-CM

## 2020-01-25 DIAGNOSIS — I10 Essential (primary) hypertension: Secondary | ICD-10-CM | POA: Diagnosis not present

## 2020-01-25 DIAGNOSIS — E6609 Other obesity due to excess calories: Secondary | ICD-10-CM | POA: Diagnosis not present

## 2020-01-25 DIAGNOSIS — I739 Peripheral vascular disease, unspecified: Secondary | ICD-10-CM

## 2020-01-25 LAB — CBC
HCT: 25.8 % — ABNORMAL LOW (ref 36.0–46.0)
Hemoglobin: 8.3 g/dL — ABNORMAL LOW (ref 12.0–15.0)
MCH: 27.1 pg (ref 26.0–34.0)
MCHC: 32.2 g/dL (ref 30.0–36.0)
MCV: 84.3 fL (ref 80.0–100.0)
Platelets: 192 10*3/uL (ref 150–400)
RBC: 3.06 MIL/uL — ABNORMAL LOW (ref 3.87–5.11)
RDW: 18.6 % — ABNORMAL HIGH (ref 11.5–15.5)
WBC: 5.6 10*3/uL (ref 4.0–10.5)
nRBC: 0 % (ref 0.0–0.2)

## 2020-01-25 LAB — GLUCOSE, CAPILLARY
Glucose-Capillary: 180 mg/dL — ABNORMAL HIGH (ref 70–99)
Glucose-Capillary: 200 mg/dL — ABNORMAL HIGH (ref 70–99)
Glucose-Capillary: 210 mg/dL — ABNORMAL HIGH (ref 70–99)
Glucose-Capillary: 218 mg/dL — ABNORMAL HIGH (ref 70–99)
Glucose-Capillary: 219 mg/dL — ABNORMAL HIGH (ref 70–99)
Glucose-Capillary: 232 mg/dL — ABNORMAL HIGH (ref 70–99)

## 2020-01-25 LAB — COMPREHENSIVE METABOLIC PANEL
ALT: 35 U/L (ref 0–44)
AST: 27 U/L (ref 15–41)
Albumin: 1.9 g/dL — ABNORMAL LOW (ref 3.5–5.0)
Alkaline Phosphatase: 53 U/L (ref 38–126)
Anion gap: 7 (ref 5–15)
BUN: 88 mg/dL — ABNORMAL HIGH (ref 8–23)
CO2: 20 mmol/L — ABNORMAL LOW (ref 22–32)
Calcium: 8.4 mg/dL — ABNORMAL LOW (ref 8.9–10.3)
Chloride: 110 mmol/L (ref 98–111)
Creatinine, Ser: 2.27 mg/dL — ABNORMAL HIGH (ref 0.44–1.00)
GFR calc Af Amer: 24 mL/min — ABNORMAL LOW (ref >60)
GFR calc non Af Amer: 21 mL/min — ABNORMAL LOW (ref >60)
Glucose, Bld: 233 mg/dL — ABNORMAL HIGH (ref 70–99)
Potassium: 5.7 mmol/L — ABNORMAL HIGH (ref 3.5–5.1)
Sodium: 137 mmol/L (ref 135–145)
Total Bilirubin: 0.7 mg/dL (ref 0.3–1.2)
Total Protein: 7.1 g/dL (ref 6.5–8.1)

## 2020-01-25 LAB — VITAMIN D 25 HYDROXY (VIT D DEFICIENCY, FRACTURES): Vit D, 25-Hydroxy: 43.42 ng/mL (ref 30–100)

## 2020-01-25 MED ORDER — GLUCERNA 1.5 CAL PO LIQD
1000.0000 mL | ORAL | Status: DC
  Administered 2020-01-25 – 2020-02-01 (×9): 1000 mL
  Filled 2020-01-25 (×12): qty 1000

## 2020-01-25 MED ORDER — SODIUM ZIRCONIUM CYCLOSILICATE 5 G PO PACK
5.0000 g | PACK | Freq: Once | ORAL | Status: DC
  Filled 2020-01-25: qty 1

## 2020-01-25 MED ORDER — GLUCERNA 1.5 CAL PO LIQD: 1000.0000 mL | ORAL | Status: DC

## 2020-01-25 MED ORDER — FREE WATER
170.0000 mL | Status: DC
  Administered 2020-01-25 – 2020-02-01 (×43): 170 mL

## 2020-01-25 MED ORDER — SODIUM ZIRCONIUM CYCLOSILICATE 5 G PO PACK
5.0000 g | PACK | Freq: Once | ORAL | Status: AC
  Administered 2020-01-25: 5 g
  Filled 2020-01-25: qty 1

## 2020-01-25 NOTE — Progress Notes (Signed)
PROGRESS NOTE    BLAKELYNN SCHEELER  GDJ:[id number] DOB: Oct 03, 1945 DOA: 01/13/2020 PCP: Glendale Chard, MD   Brief Narrative:  74 year old with a history of DM2, CKD stage IV, [place] trait, B thalasemia, and prior bariatric surgery who was transferred to [facility] from [facility] where she originally presented 12/21/2019 with complaints of chest pain.  A VQ scan and Dopplers were negative at that facility, and a cardiac work-up revealed only moderate pulmonary hypertension.  Her hospital course was complicated by the sudden onset of encephalopathy leukocytosis and fever.  ID and Neurology were consulted and the patient underwent MRI of the brain and a lumbar puncture, both of which were unrevealing.  EEG was unrevealing.  Her case was further complicated by anemia, thrombocytopenia, and acute kidney injury with consideration being given to possible TTP.  She was treated with 5 rounds of plasmapheresis and steroids without improvement.  Adam TS 13 was normal.  Bone marrow biopsy was performed.  At request of the family she was transferred to [facility].  Work-up after arrival to [facility] has included a negative long-term EEG, negative MRI brain, and negative repeat LP.  She had a repeat course of IVIG x5 days. 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 -MRI brain 7/11 negative -Patient was on Keppra 500 mg IV twice daily.  Discussed with son and he states that he feels that she may be more sleepy due to this.  Discussed with neurology, Dr. Lorraine Lax, recommended discontinuing Keppra and monitoring closely for seizures -Repeat LP 7/12 negative.  -Patient has completed 5 days  of IVIG, which was started on 01/16/2020  -Neurology consulted and appreciated, has no further recommendations at this time.  We will continue to follow her the next 5 to 6 days to see if there is any improvement from completed course of IVIG.  However effects may take 2 to 4 weeks.  Loose stools -Secondary to tube feeding with no evidence of C. difficile clinically  Hyponatremia due to mild dehydration -Resolved, continue to monitor BMP  Diabetes mellitus, type II -Patient normally uses an insulin pump at baseline -Hemoglobin A1c 7.1 (on 01/14/2020) -Continue Levemir, insulin sliding scale and CBG monitoring   Vitamin D deficiency -Vitamin D, 25-hydroxy currently 43.42 -within normal limits   Anemia of chronic disease/ Sickle cell trait / Beta thalassemia -Hemoglobin currently 8.3 -Patient did receive 1 unit PRBC on 01/17/2020 -Continue to monitor CBC  Chronic kidney disease, stage IV -Baseline creatinine approximately 2.4, currently creatinine 2.27 -Continue to monitor BMP  Hyperkalemia -Potassium 5.7 -Lokelma and monitor BMP  Essential hypertension -Continue amlodipine, metoprolol -BP stable  Mild to moderate protein calorie malnutrition with dysphagia -Not a candidate for PEG tube given history of gastric bypass  -will have to consider J-tube per general surgery if long-term tube feeds requirement -s/p Cortrak placement  L1-L5 vertebral body/sacral sclerosis -Noted per Hematology at [facility] -felt to be due to sickle cell trait  DVT Prophylaxis  heparin  Code Status: Full  Family Communication: None at bedside. Son via phone.  Disposition Plan:  Status is: Inpatient  Remains inpatient appropriate because:Altered mental status, IV treatments appropriate due to intensity of illness or inability to take PO and Inpatient level  of care appropriate due to severity of illness   Dispo: The patient is from: Home              Anticipated d/c is to: TBD               Anticipated d/c date is: > 3 days              Patient currently is not medically stable to d/c.   Consultants Neurology   Procedures  EEG Lumbar puncture Cortrak  Antibiotics   Anti-infectives (From admission, onward)   None      Subjective:   [name] seen and examined today.   Opens eyes to voice.  When listening to her, I placed my stethoscope on her chest, which she removed.  Otherwise does not appear to be in any distress at this time.  Patient does not follow commands or speak at this time.  Objective:   Vitals:   01/25/20 0300 01/25/20 0800 01/25/20 0816 01/25/20 1210  BP: 125/63 (!) 144/61 129/66 139/69  Pulse: 100 (!) 111 (!) 107 96  Resp: _0 Temp: 98.9 F (37.2 C) 99.3 F (37.4 C) 99.1 F (37.3 C) 99.3 F (37.4 C)  TempSrc: Axillary Axillary  Axillary  SpO2: 99% 100% 99% 100%  Weight:        Intake/Output Summary (Last 24 hours) at 01/25/2020 1500 Last data filed at 01/25/2020 0300 Gross per 24 hour  Intake --  Output 1350 ml  Net -1350 ml   Filed Weights   01/16/20 0444 01/19/20 0500 01/20/20 0500  Weight: 80.5 kg 77 kg 76 kg    Exam  General: Well developed, well nourished, NAD, appears stated age  HEENT: NCAT, mucous membranes moist.   Cardiovascular: S1 S2 auscultated, RRR  Respiratory: Clear to auscultation bilaterally  Abdomen: Soft, nontender, nondistended, + bowel sounds  Extremities: warm dry without cyanosis clubbing or edema  Neuro: Awake, eyes open, moved arms, not to command   Data Reviewed: I have personally reviewed following labs and imaging studies  CBC: Recent Labs  Lab 01/20/20 0407 01/24/20 0407 01/25/20 0447  WBC 5.9 6.3 5.6  HGB 9.3* 8.5* 8.3*  HCT 29.9* 27.3* 25.8*  MCV 84.7 84.3 84.3  PLT 156 216 916   Basic Metabolic Panel: Recent Labs  Lab 01/20/20 0407 01/24/20 0407 01/25/20 0447  NA 135 136 137  K 4.6 5.7* 5.7*  CL 110 111 110  CO2 19* 19* 20*  GLUCOSE 207* 257*  233*  BUN 44* 79* 88*  CREATININE 1.69* 2.23* 2.27*  CALCIUM 8.1* 8.4* 8.4*  MG 2.0 2.7*  --   PHOS  --  5.1*  --    GFR: Estimated Creatinine Clearance: 22.7 mL/min (A) (by C-G formula based on SCr of 2.27 mg/dL (H)). Liver Function Tests: Recent Labs  Lab 01/20/20 0407 01/24/20 0407 01/25/20 0447  AST _1 ALT 38 34 35  ALKPHOS 51 50 53  BILITOT 0.5 0.7 0.7  PROT 8.1 7.4 7.1  ALBUMIN 1.9* 1.9* 1.9*   No results for input(s): LIPASE, AMYLASE in the last 168 hours. No results for input(s): AMMONIA in the last 168 hours. Coagulation Profile: No results for input(s): INR, PROTIME in the last 168 hours. Cardiac Enzymes: No results for input(s): CKTOTAL, CKMB, CKMBINDEX, TROPONINI in the last 168 hours. BNP (last 3 results) No results for input(s): PROBNP in the last 8760 hours. HbA1C: No results for input(s): HGBA1C in the last  72 hours. CBG: Recent Labs  Lab 01/24/20 2032 01/24/20 2324 01/25/20 0332 01/25/20 0759 01/25/20 1204  GLUCAP 225* 237* 200* 210* 218*   Lipid Profile: No results for input(s): CHOL, HDL, LDLCALC, TRIG, CHOLHDL, LDLDIRECT in the last 72 hours. Thyroid Function Tests: Recent Labs    01/24/20 0407  TSH 1.815   Anemia Panel: Recent Labs    01/24/20 0407  VITAMINB12 634  FOLATE 12.8   Urine analysis:    Component Value Date/Time   COLORURINE STRAW (A) 02/26/2019 2119   APPEARANCEUR CLEAR 02/26/2019 2119   LABSPEC 1.009 02/26/2019 2119   PHURINE 5.0 02/26/2019 2119   GLUCOSEU NEGATIVE 02/26/2019 2119   HGBUR NEGATIVE 02/26/2019 2119   BILIRUBINUR negative 09/28/2019 1556   KETONESUR 5 (A) 02/26/2019 2119   PROTEINUR Positive (A) 09/28/2019 1556   PROTEINUR 30 (A) 02/26/2019 2119   UROBILINOGEN 0.2 09/28/2019 1556   NITRITE negative 09/28/2019 1556   NITRITE NEGATIVE 02/26/2019 2119   LEUKOCYTESUR Trace (A) 09/28/2019 1556   LEUKOCYTESUR NEGATIVE 02/26/2019 2119   Sepsis  Labs: _0 (procalcitonin:4,lacticidven:4)  ) Recent Results (from the past 240 hour(s))  CSF culture     Status: None   Collection Time: 01/16/20 12:14 PM   Specimen: PATH Cytology CSF; Cerebrospinal Fluid  Result Value Ref Range Status   Specimen Description CSF  Final   Special Requests NONE  Final   Gram Stain   Final    WBC PRESENT,BOTH PMN AND MONONUCLEAR NO ORGANISMS SEEN CYTOSPIN SMEAR    Culture   Final    NO GROWTH Performed at [facility] [address], [place], [place] [address]    Report Status 01/19/2020 FINAL  Final      Radiology Studies: No results found.   Scheduled Meds: . amLODipine  10 mg Per Tube Daily  . chlorhexidine  15 mL Mouth Rinse BID  . feeding supplement (PROSource TF)  45 mL Per Tube BID  . free water  150 mL Per Tube Q4H  . heparin  5,000 Units Subcutaneous Q8H  . insulin aspart  0-15 Units Subcutaneous Q4H  . insulin aspart  4 Units Subcutaneous Q4H  . insulin detemir  12 Units Subcutaneous BID  . lidocaine  1 patch Transdermal Q24H  . mouth rinse  15 mL Mouth Rinse q12n4p  . metoprolol tartrate  100 mg Per Tube BID  . sennosides  5 mL Per Tube BID  . thiamine  100 mg Per Tube Daily   Continuous Infusions: . feeding supplement (VITAL 1.5 CAL) 1,000 mL (01/25/20 1045)     LOS: 12 days   Time Spent in minutes   45 minutes  Ryun Velez D.O. on 01/25/2020 at 3:00 PM  Between 7am to 7pm - Please see pager noted on [email or web address]  After 7pm go to [email or web address]  And look for the night coverage person covering for me after hours  [facility] Office  [phone]

## 2020-01-25 NOTE — Plan of Care (Signed)
  Problem: Clinical Measurements: Goal: Will remain free from infection Outcome: Progressing   Problem: Nutrition: Goal: Adequate nutrition will be maintained Outcome: Progressing   Problem: Pain Managment: Goal: General experience of comfort will improve Outcome: Progressing   Problem: Safety: Goal: Ability to remain free from injury will improve Outcome: Progressing

## 2020-01-25 NOTE — Progress Notes (Signed)
Nutrition Follow-up  DOCUMENTATION CODES:   Not applicable  INTERVENTION:   -D/c Vital 1.5 -D/c Prosource -Initiate Glucerna 1.5 @ 55 ml/hr via cortrak tube   170 ml free water flush every 4 hours  Tube feeding regimen provides 1980 kcal (99% of needs), 112 grams of protein, and 1002 ml of H2O. Total free water: 2022 ml daily  Total carbohydrate: 176 grams daily  NUTRITION DIAGNOSIS:   Inadequate oral intake related to inability to eat as evidenced by NPO status.  Ongoing  GOAL:   Patient will meet greater than or equal to 90% of their needs  Met with TF  MONITOR:   Labs, I & O's, TF tolerance, Weight trends, Diet advancement  REASON FOR ASSESSMENT:   Consult Enteral/tube feeding initiation and management  ASSESSMENT:   74 year old female transferred from [facility] in [place] per family's request after being admitted there initially for chest pain on June 15th while vacationing. Patient subesequently developed AMS, fever, thrombocytopenia, TTP was ruled out, underwent 5 rounds of plasmapheresis and started on steroids with no significant improvement. Past medical history significant for HTN, CKD IV, IDDM, history of gastric bypass (2011).  7/12 s/p LP (negative); pt removed NGT 7/13 IVIG started 7/14 Cortrak replaced (per KUB, tip in distal stomach region)  Reviewed I/O's: -925 ml x 24 hours and +2.1 L since admission  Spoke with pt son [name]) at bedside, who reports that pt continues to tolerate TF well. He reports she is able to respond to pain and has not indicated any pain, particularly in the abdominal region.   Pt able to make eye contact when name is called or with contact. Per son, this has been pt's baseline over the past few weeks. He shared that prior to hospitalization, she was a very active person.   Discussed rationale for cortrak feedings with son as well as properties of current formula. Pt reports pt was on a different formula at hospital in  [facility] (per [facility] Records, pt was receiving Suplena @ 40 ml/hr x 22 hours with 1 packet Prosource BID, and 250 ml free water every 4 hours- regimen provided 1619 kcals, 51 grams protein, and 2138 ml water). Son reports that he has noticed that her blood sugars have not been as well controlled since being transferred here. Discussed rationale for elevated CBGS while on continuous feedings and how medications can impact blood sugar control. RD agreed to review TF regimen to help with blood sugar control. Pt son very appreciative of RD visit and recommendations.   Current TF regimen providing 247 grams carbohydrate per day.   Per MD notes, considering possible j-tube if mental status does not improve. Also likely SNF placement once medically stable.   Medications reviewed and include thiamine.   Labs reviewed: K: 5.7, CBGS: 200-218 (inpatient orders for glycemic control are 0-15 units insulin aspart every 4 hours, 4 units insulin aspart every 4 hours, and 12 units inuslin detemir BID).   NUTRITION - FOCUSED PHYSICAL EXAM:    Most Recent Value  Orbital Region No depletion  Upper Arm Region No depletion  Thoracic and Lumbar Region No depletion  Buccal Region No depletion  Temple Region No depletion  Clavicle Bone Region No depletion  Clavicle and Acromion Bone Region No depletion  Scapular Bone Region No depletion  Dorsal Hand Mild depletion  Patellar Region Mild depletion  Anterior Thigh Region Mild depletion  Posterior Calf Region Mild depletion  Edema (RD Assessment) None  Hair Reviewed  Eyes Reviewed  Mouth Reviewed  Skin Reviewed  Nails Reviewed       Diet Order:   Diet Order            Diet NPO time specified  Diet effective now                 EDUCATION NEEDS:   No education needs have been identified at this time  Skin:  Skin Assessment: Skin Integrity Issues: Skin Integrity Issues:: Stage I, Other (Comment) Stage I: perineum Other: MASD;bilateral  buttocks;groin  Last BM:  7/13 type 6  Height:   Ht Readings from Last 1 Encounters:  09/28/19 5' 4.8" (1.646 m)    Weight:   Wt Readings from Last 1 Encounters:  01/20/20 76 kg   BMI:  Body mass index is 28.05 kg/m.  Estimated Nutritional Needs:   Kcal:  2000-2200  Protein:  95-110  Fluid:  >/= 2 L/day    Loistine Chance, RD, LDN, [place] Registered Dietitian II Certified Diabetes Care and Education Specialist Please refer to [facility] for RD and/or RD on-call/weekend/after hours pager

## 2020-01-25 NOTE — TOC Initial Note (Signed)
Transition of Care [facility]) - Initial/Assessment Note    Patient Details  Name: Miranda Roman MRN: [id number] Date of Birth: [birth year]  Transition of Care [facility]) CM/SW Contact:    Geralynn Ochs, LCSW Phone Number: 01/25/2020, 8:58 AM  Clinical Narrative:     CSW met with patient's daughter and son at bedside to discuss concerns about insurance denying the transfer from [place] to [place]. CSW received HCPOA completed in [place] and made copy for chart, verified with [facility] that document could be utilized here. CSW faxed copy to [facility] Medicare so that patient's daughter could advocate on the patient's behalf. Patient's son also asked about having Dr. Hulen Skains consult Dr. Christella Noa with neurosurgery, and CSW sent message. Dr. Hulen Skains to come to bedside and discuss with the patient's son and daughter. CSW discussed SNF at discharge with patient's children, and they are in agreement. Patient will need feeding addressed prior to discharge, as she cannot go to SNF with cortrak. CSW to follow.              Expected Discharge Plan: Skilled Nursing Facility Barriers to Discharge: Insurance Authorization, Continued Medical Work up   Patient Goals and CMS Choice Patient states their goals for this hospitalization and ongoing recovery are:: patient unable to participate in goal setting at this time due to disorientation CMS Medicare.gov Compare Post Acute Care list provided to:: Patient Represenative (must comment) Choice offered to / list presented to : Adult Children  Expected Discharge Plan and Services Expected Discharge Plan: [place] Choice: [place] Living arrangements for the past 2 months: Single Family Home                                      Prior Living Arrangements/Services Living arrangements for the past 2 months: Single Family Home   Patient language and need for interpreter reviewed:: No Do you feel safe going back to  the place where you live?: Yes      Need for Family Participation in Patient Care: Yes (Comment) Care giver support system in place?: No (comment)   Criminal Activity/Legal Involvement Pertinent to Current Situation/Hospitalization: No - Comment as needed  Activities of Daily Living Home Assistive Devices/Equipment: None ADL Screening (condition at time of admission) Patient's cognitive ability adequate to safely complete daily activities?: No Is the patient deaf or have difficulty hearing?: No Does the patient have difficulty seeing, even when wearing glasses/contacts?: No Does the patient have difficulty concentrating, remembering, or making decisions?: Yes Patient able to express need for assistance with ADLs?: No Does the patient have difficulty dressing or bathing?: Yes Independently performs ADLs?: No Communication: Dependent Is this a change from baseline?: Pre-admission baseline Dressing (OT): Dependent Is this a change from baseline?: Pre-admission baseline Grooming: Dependent Is this a change from baseline?: Pre-admission baseline Feeding: Dependent Is this a change from baseline?: Pre-admission baseline Bathing: Dependent Is this a change from baseline?: Pre-admission baseline Toileting: Dependent Is this a change from baseline?: Pre-admission baseline In/Out Bed: Dependent Is this a change from baseline?: Pre-admission baseline Walks in Home: Dependent Is this a change from baseline?: Pre-admission baseline Does the patient have difficulty walking or climbing stairs?: Yes Weakness of Legs: Both Weakness of Arms/Hands: Both  Permission Sought/Granted Permission sought to share information with : Facility Sport and exercise psychologist, Family Supports Permission granted to share information with : Yes, Verbal  Permission Granted  Share Information with NAME: Alycia Rossetti  Permission granted to share info w AGENCY: SNF  Permission granted to share info w Relationship:  Children     Emotional Assessment Appearance:: Appears stated age Attitude/Demeanor/Rapport: Unable to Assess Affect (typically observed): Unable to Assess   Alcohol / Substance Use: Not Applicable Psych Involvement: No (comment)  Admission diagnosis:  Acute encephalopathy [G93.40] Patient Active Problem List   Diagnosis Date Noted  . Pressure injury of skin 01/13/2020  . Acute encephalopathy 01/13/2020  . PAD (peripheral artery disease) ([place]) 06/06/2019  . Pancreatic cyst 06/06/2019  . Class 1 obesity due to excess calories with serious comorbidity and body mass index (BMI) of 31.0 to 31.9 in adult 06/06/2019  . Cyst of left kidney 03/10/2019  . Lumbago 02/27/2019  . Facet arthritis of lumbar region 02/27/2019  . Abnormal CT of the chest 12/28/2018  . Atrophic vaginitis 12/15/2018  . Atypical squamous cells of undetermined significance on cytologic smear of cervix (ASC-US) 12/15/2018  . Overweight with body mass index (BMI) 25.0-29.9 12/15/2018  . 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 Osteoporosis 12/15/2018  . Unspecified dyspareunia (CODE) 12/15/2018  . 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 Gastroesophageal reflux disease 08/26/2016  . Aortic valve regurgitation 02/11/2016  . Chest pain at rest 01/30/2015  . Atherosclerosis of native coronary artery of native heart without angina pectoris 12/24/2012  . Benign essential HTN 12/24/2012  . Hyperlipidemia 12/24/2012  . 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 03/10/2012  . Diabetes mellitus with stage 4 chronic kidney disease ([place]) 03/10/2012   PCP:  Glendale Chard, MD Pharmacy:   [id number] [place] [place] [address]Phone: [phone]: [phone]    Social Determinants of Health (SDOH) Interventions    Readmission Risk Interventions No flowsheet data  found.

## 2020-01-25 NOTE — Progress Notes (Signed)
Inpatient Diabetes Program Recommendations  AACE/ADA: New Consensus Statement on Inpatient Glycemic Control (2015)  Target Ranges:  Prepandial:   less than 140 mg/dL      Peak postprandial:   less than 180 mg/dL (1-2 hours)      Critically ill patients:  140 - 180 mg/dL   Lab Results  Component Value Date   GLUCAP 218 (H) 01/25/2020   HGBA1C 7.1 (H) 01/14/2020    Review of Glycemic Control Results for NATALYN, SZYMANOWSKI (MRN [id number]) as of 01/25/2020 12:09  Ref. Range 01/24/2020 23:24 01/25/2020 03:32 01/25/2020 07:59 01/25/2020 12:04  Glucose-Capillary Latest Ref Range: 70 - 99 mg/dL 237 (H) 200 (H) 210 (H) 218 (H)   Current orders for Inpatient glycemic control:  Levemir 12 units BID Novolog 0-15 units Q4 hours Novolog 4 units Q4 hours  Vital 1.5 Cal 55 ml/hour  Inpatient Diabetes Program Recommendations:    - Increase Novolog 6 units Q4 hours for tube feed coverage.  Thanks, Bronson Curb, MSN, RNC-OB Diabetes Coordinator [phone] (8a-5p)

## 2020-01-25 NOTE — Care Management (Signed)
6808 01-25-20 Case Manager reviewed  recommendation from PT/OT regarding LTACH. Case Manager did reach out to both [facility] and [facility] and patient is not a candidate for LTACH at this time. Clinical Social Worker will continue to follow for additional transition of care needs. Bethena Roys, RN,BSN Case Manager

## 2020-01-26 ENCOUNTER — Ambulatory Visit: Payer: Medicare Other | Admitting: Physician Assistant

## 2020-01-26 DIAGNOSIS — E6609 Other obesity due to excess calories: Secondary | ICD-10-CM | POA: Diagnosis not present

## 2020-01-26 DIAGNOSIS — Z9884 Bariatric surgery status: Secondary | ICD-10-CM | POA: Diagnosis not present

## 2020-01-26 DIAGNOSIS — G9340 Encephalopathy, unspecified: Secondary | ICD-10-CM | POA: Diagnosis not present

## 2020-01-26 DIAGNOSIS — I10 Essential (primary) hypertension: Secondary | ICD-10-CM | POA: Diagnosis not present

## 2020-01-26 DIAGNOSIS — E1122 Type 2 diabetes mellitus with diabetic chronic kidney disease: Secondary | ICD-10-CM | POA: Diagnosis not present

## 2020-01-26 LAB — GLUCOSE, CAPILLARY
Glucose-Capillary: 131 mg/dL — ABNORMAL HIGH (ref 70–99)
Glucose-Capillary: 166 mg/dL — ABNORMAL HIGH (ref 70–99)
Glucose-Capillary: 148 mg/dL — ABNORMAL HIGH (ref 70–99)
Glucose-Capillary: 176 mg/dL — ABNORMAL HIGH (ref 70–99)
Glucose-Capillary: 184 mg/dL — ABNORMAL HIGH (ref 70–99)

## 2020-01-26 LAB — HEMOGLOBIN AND HEMATOCRIT, BLOOD
HCT: 27 % — ABNORMAL LOW (ref 36.0–46.0)
Hemoglobin: 8.5 g/dL — ABNORMAL LOW (ref 12.0–15.0)

## 2020-01-26 LAB — BASIC METABOLIC PANEL
Anion gap: 8 (ref 5–15)
BUN: 86 mg/dL — ABNORMAL HIGH (ref 8–23)
CO2: 21 mmol/L — ABNORMAL LOW (ref 22–32)
Calcium: 8.3 mg/dL — ABNORMAL LOW (ref 8.9–10.3)
Chloride: 110 mmol/L (ref 98–111)
Creatinine, Ser: 2.07 mg/dL — ABNORMAL HIGH (ref 0.44–1.00)
GFR calc Af Amer: 27 mL/min — ABNORMAL LOW (ref >60)
GFR calc non Af Amer: 23 mL/min — ABNORMAL LOW (ref >60)
Glucose, Bld: 181 mg/dL — ABNORMAL HIGH (ref 70–99)
Potassium: 5.9 mmol/L — ABNORMAL HIGH (ref 3.5–5.1)
Sodium: 139 mmol/L (ref 135–145)

## 2020-01-26 MED ORDER — SODIUM ZIRCONIUM CYCLOSILICATE 10 G PO PACK
10.0000 g | PACK | Freq: Every day | ORAL | Status: AC
  Administered 2020-01-26: 10 g

## 2020-01-26 MED ORDER — SODIUM ZIRCONIUM CYCLOSILICATE 10 G PO PACK
10.0000 g | PACK | Freq: Every day | ORAL | Status: DC
  Filled 2020-01-26: qty 1

## 2020-01-26 NOTE — Progress Notes (Addendum)
Occupational Therapy Treatment Patient Details Name: Miranda Roman MRN: [id number] DOB: 30-Sep-1945 Today's Date: 01/26/2020    History of present illness Pateint is a 74 year old female. 3 weeks prior she injured her foot tubing on vacation. The next day she began to have chest pain and quickly developed acute encephalopathy. She was flown from [place] (on vacation) to [place] where she lives. 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 Pt with starting to get tightness in wrist extensors and when she does spontaneously move her arms she has wrist drop. Pt currently has adequate PROM in wrist, digits, and web space to warrant use of Bil hand splints. They have been ordered and will be applied once they get here and monitored for any issues before further progressing splint wearing schedule. Goal added.  Follow Up Recommendations  LTACH    Equipment Recommendations  None recommended by OT       Precautions / Restrictions Precautions Precautions: Fall;Other (comment) Precaution Comments: feeding tube Restrictions Weight Bearing Restrictions: No                                             Frequency  Min 2X/week        Progress Toward Goals  OT Goals(current goals can now be found in the care plan section)  Progress towards OT goals: Progressing toward goals  Acute Rehab OT Goals Patient Stated Goal: pt unable to state.  Family is hoping that she will improve and be able to engage and be as independent as possible  OT Goal Formulation: With patient Time For Goal Achievement: 02/03/20 Potential to Achieve Goals: Fair ADL Goals Pt/caregiver will Perform Home Exercise Program: Increased ROM;Increased strength;Both right and left upper extremity;With written HEP provided (caregiver will perfrom,  P/AA/AROM) Additional ADL Goal #4: Pt will tolerate Bil UE splint wearing to help maintain ROM  Plan Discharge plan remains appropriate;Frequency remains appropriate       AM-PAC OT "6 Clicks" Daily Activity     Outcome Measure   Help from another person eating meals?: Total Help from another person taking care of personal grooming?: Total Help from another person toileting, which includes using toliet, bedpan, or urinal?: Total Help from another person bathing (including washing, rinsing, drying)?: Total Help from another person to put on and taking off regular upper body clothing?: Total Help from another person to put on and taking off regular lower body clothing?: Total 6 Click Score: 6    End of Session    OT Visit Diagnosis: Cognitive communication deficit (R41.841)   Activity Tolerance Patient tolerated treatment well   Patient Left in bed;with call bell/phone within reach;with bed alarm set;with family/visitor present           Time: 7672-0947 OT Time Calculation (min): 10 min  Charges: OT General Charges $OT Visit: 1 Visit OT Treatments $Therapeutic Activity: 8-22 mins $Therapeutic Exercise: 8-22 mins  [place], OTR/L Acute Rehab Services Pager [phone] Office [phone]      Almon Register 01/26/2020, 10:24 AM

## 2020-01-26 NOTE — Progress Notes (Signed)
Occupational Therapy Treatment Patient Details Name: Miranda Roman MRN: [id number] DOB: [birth year] Today's Date: 01/26/2020    History of present illness Pateint is a 74 year old female. 3 weeks prior she injured her foot tubing on vacation. The next day she began to have chest pain and quickly developed acute encephalopathy. She was flown from [place] (on vacation) to [place] where she lives. 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 Pt continue to require total A +2 for all aspects of bed mobility and sitting balance. Pt unable to follow commands this session with pt currently requiring total A for all ADLs. Pt completed cervical rotation R<>L from EOB with total A. PROM performed to bilateral wrists. Pt HR max 120 bpm with O2 WFL. DC plan remains appropriate, will follow acutely per POC.   Follow Up Recommendations  LTACH    Equipment Recommendations  None recommended by OT    Recommendations for Other Services      Precautions / Restrictions Precautions Precautions: Fall;Other (comment) Precaution Comments: feeding tube Restrictions Weight Bearing Restrictions: No       Mobility Bed Mobility Overal bed mobility: Needs Assistance Bed Mobility: Sit to Supine;Supine to Sit;Rolling Rolling: Total assist;+2 for physical assistance   Supine to sit: Total assist;+2 for physical assistance Sit to supine: Total assist;+2 for physical assistance   General bed mobility comments: total A +2 for all aspects of bed mobility, utilized bed pad and helicopter method to transition pt from supine<>sit  Transfers                 General transfer comment: unable to safely attempt     Balance Overall balance assessment: Needs assistance Sitting-balance support: No upper extremity supported;Feet supported Sitting balance-Leahy Scale: Zero Sitting balance  - Comments: pt requires total A to maintain sitting balance                                   ADL either performed or assessed with clinical judgement   ADL Overall ADL's : Needs assistance/impaired     Grooming: Wash/dry face;Total assistance;Bed level Grooming Details (indicate cue type and reason): total A to wash face from EOB                             Functional mobility during ADLs: Total assistance;+2 for physical assistance (bed mobility only) General ADL Comments: pt currently requries total A for all aspects of bed mobility and ADLs     Vision   Additional Comments: pt will attend to visual stimulus but unable to follow commands to assess vision   Perception     Praxis      Cognition Arousal/Alertness: Awake/alert Behavior During Therapy: Flat affect Overall Cognitive Status: Impaired/Different from baseline Area of Impairment: Following commands;Attention                   Current Attention Level: Focused   Following Commands: Follows one step commands inconsistently       General Comments: pt continues to be unable to follow command        Exercises Other Exercises Other Exercises: cervical PROM rotation R<>L from sitting EOB Other Exercises: PROM flexion/ extension  to bilateral wrists   Shoulder Instructions       General Comments  pts daughter present during session, bery helpful and supportive. Pt HR increase to max 120 bpm, HR 112 bpm from supine after sitting EOB    Pertinent Vitals/ Pain       Pain Assessment: Faces Faces Pain Scale: Hurts even more Pain Location: general discomfort all over when transitioning to EOB Pain Descriptors / Indicators: Grimacing;Discomfort Pain Intervention(s): Limited activity within patient's tolerance;Monitored during session;Repositioned;RN gave pain meds during session  Home Living                                          Prior Functioning/Environment               Frequency  Min 2X/week        Progress Toward Goals  OT Goals(current goals can now be found in the care plan section)  Progress towards OT goals: Progressing toward goals  Acute Rehab OT Goals Patient Stated Goal: pt unable to state.  Family is hoping that she will improve and be able to engage and be as independent as possible  OT Goal Formulation: With patient Time For Goal Achievement: 02/03/20 Potential to Achieve Goals: [place] Discharge plan remains appropriate;Frequency remains appropriate    Co-evaluation                 AM-PAC OT "6 Clicks" Daily Activity     Outcome Measure   Help from another person eating meals?:  (feeding tube) Help from another person taking care of personal grooming?: Total Help from another person toileting, which includes using toliet, bedpan, or urinal?: Total Help from another person bathing (including washing, rinsing, drying)?: Total Help from another person to put on and taking off regular upper body clothing?: Total Help from another person to put on and taking off regular lower body clothing?: Total 6 Click Score: 5    End of Session    OT Visit Diagnosis: Cognitive communication deficit (R41.841)   Activity Tolerance Patient tolerated treatment well   Patient Left in bed;with call bell/phone within reach;with bed alarm set;with family/visitor present   Nurse Communication Mobility status        Time: 8466-5993 OT Time Calculation (min): 28 min  Charges: OT General Charges $OT Visit: 1 Visit OT Treatments $Therapeutic Activity: 8-22 mins  Lanier Clam., COTA/L Acute Rehabilitation Services [phone]    Ihor Gully 01/26/2020, 8:57 AM

## 2020-01-26 NOTE — Progress Notes (Signed)
Reason for consult: Persistent encephalopathy  Subjective: Patient remains akinetic.  Eyes open but does not follow any commands.  Grimaces to pain.  Not moving spontaneously but withdraws to noxious stimulus   ROS: negative except above   Examination  Vital signs in last 24 hours: Temp:  [97.7 F (36.5 C)-99.5 F (37.5 C)] 98.9 F (37.2 C) (07/22 1628) Pulse Rate:  [88-110] 97 (07/22 1628) Resp:  [18-27] 23 (07/22 1628) BP: (114-147)/(61-79) 114/61 (07/22 1628) SpO2:  [97 %-100 %] 100 % (07/22 1628)  General: lying in bed CVS: pulse-normal rate and rhythm RS: breathing comfortably Extremities: normal   Neuro: MS: Patient eyes open, does not track.  No does not follow any commands and is nonverbal CN: pupils equal and reactive,  EOMI, face symmetric, Motor: Withdraws to noxious stimulus in all 4 extremities Reflexes:  plantars: flexor Coordination: normal Gait: not tested  Basic Metabolic Panel: Recent Labs  Lab 01/20/20 0407 01/20/20 0407 01/24/20 0407 01/25/20 0447 01/26/20 0521  NA 135  --  136 137 139  K 4.6  --  5.7* 5.7* 5.9*  CL 110  --  111 110 110  CO2 19*  --  19* 20* 21*  GLUCOSE 207*  --  257* 233* 181*  BUN 44*  --  79* 88* 86*  CREATININE 1.69*  --  2.23* 2.27* 2.07*  CALCIUM 8.1*   < > 8.4* 8.4* 8.3*  MG 2.0  --  2.7*  --   --   PHOS  --   --  5.1*  --   --    < > = values in this interval not displayed.    CBC: Recent Labs  Lab 01/20/20 0407 01/24/20 0407 01/25/20 0447 01/26/20 0521  WBC 5.9 6.3 5.6  --   HGB 9.3* 8.5* 8.3* 8.5*  HCT 29.9* 27.3* 25.8* 27.0*  MCV 84.7 84.3 84.3  --   PLT 156 216 192  --      Coagulation Studies: No results for input(s): LABPROT, INR in the last 72 hours.    ASSESSMENT AND PLAN  74 year old female with abrupt decline in mentation in conjunction with chest pain and thrombocytopenia while admitted in the hospital in [place].  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  Patient also received plasmapheresis for concern of TTP although work-up eventually was not suggestive of this.  Also had extensive work-up including multiple MRIs, lumbar punctures, EEGs, paraneoplastic panel negative.  Transferred to [place] due to requested family who lives here.  Of note thiamine was not ordered prior to administration.  Low vitamin D and borderline elevated MMA.   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   Recommendations -Continue supportive treatment -Follow-up CSF autoimmune encephalitis panel/paraneoplastic panel -Palliative care consult.    Spent 35 min in care of this patient including discussion with family.    SKarena AddisonAroor [facility] Pager Number [phone] questions after 7pm please refer to AMION to  reach the Neurologist on call

## 2020-01-26 NOTE — Progress Notes (Signed)
PROGRESS NOTE    Miranda Roman  RWE:[id number] DOB: 07/23/1945 DOA: 01/13/2020 PCP: Glendale Chard, MD   Brief Narrative:  74 year old with a history of DM2, CKD stage IV, [place] trait, B thalasemia, and prior bariatric surgery who was transferred to [facility] from [facility] where she originally presented 12/21/2019 with complaints of chest pain.  A VQ scan and Dopplers were negative at that facility, and a cardiac work-up revealed only moderate pulmonary hypertension.  Her hospital course was complicated by the sudden onset of encephalopathy leukocytosis and fever.  ID and Neurology were consulted and the patient underwent MRI of the brain and a lumbar puncture, both of which were unrevealing.  EEG was unrevealing.  Her case was further complicated by anemia, thrombocytopenia, and acute kidney injury with consideration being given to possible TTP.  She was treated with 5 rounds of plasmapheresis and steroids without improvement.  Adam TS 13 was normal.  Bone marrow biopsy was performed.  At request of the family she was transferred to [facility].  Work-up after arrival to [facility] has included a negative long-term EEG, negative MRI brain, and negative repeat LP.  She had a repeat course of IVIG x5 days. 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 -MRI brain 7/11 negative -Patient was on Keppra 500 mg IV twice daily.  Discussed with son and he states that he feels that she may be more sleepy due to this.  Discussed with neurology on 01/25/2020, Dr. Lorraine Lax, recommended discontinuing and not tapering Keppra and monitoring closely for seizures. No need to start stimulants at  this time.  -Repeat LP 7/12 negative.  -Patient has completed 5 days of IVIG, which was started on 01/16/2020  -Neurology consulted and appreciated, has no further recommendations at this time.  We will continue to follow her the next several days to see if there is any improvement from the completed course of IVIG.  However effects may take 2 to 4 weeks.  Loose stools -Secondary to tube feeding with no evidence of C. difficile clinically  Hyponatremia due to mild dehydration -Resolved, continue to monitor BMP  Diabetes mellitus, type II -Patient normally uses an insulin pump at baseline -Hemoglobin A1c 7.1 (on 01/14/2020) -Continue Levemir, insulin sliding scale and CBG monitoring   Vitamin D deficiency -Vitamin D, 25-hydroxy currently 43.42 -within normal limits   Anemia of chronic disease/ Sickle cell trait / Beta thalassemia -Hemoglobin currently 8.5 -Patient did receive 1 unit PRBC on 01/17/2020 -Continue to monitor CBC  Chronic kidney disease, stage IV -Baseline creatinine approximately 2.4, currently creatinine 2.07 -Continue to monitor BMP  Hyperkalemia -Despite lokelma given on 7/21, potassium up to 5.9 today -will give additional dose of Lokelma and monitor BMP   Essential hypertension -Continue amlodipine, metoprolol -BP stable  Mild to moderate protein calorie malnutrition with dysphagia -Not a candidate for PEG tube given history of gastric bypass  -will have to consider J-tube per general surgery if long-term tube feeds requirement -s/p Cortrak placement -Currently on glucerna  L1-L5 vertebral body/sacral sclerosis -Noted per Hematology at [facility] -felt to be due to sickle cell trait  DVT Prophylaxis  heparin  Code Status: Full  Family Communication: None at bedside. Daughter via phone.  Disposition Plan:  Status is: Inpatient  Remains inpatient appropriate because:Altered mental status, IV treatments appropriate due to intensity of  illness or inability to take PO and Inpatient level of care appropriate due to severity of illness   Dispo: The patient is from: Home              Anticipated d/c is to: TBD              Anticipated d/c date is: > 3 days              Patient currently is not medically stable to d/c.   Consultants Neurology   Procedures  EEG Lumbar puncture Cortrak  Antibiotics   Anti-infectives (From admission, onward)   None      Subjective:   Miranda Roman seen and examined today.   Opens eyes to voice.  Does not follow commands.  Objective:   Vitals:   01/25/20 2335 01/26/20 0328 01/26/20 0747 01/26/20 0800  BP: 127/64 128/72 (!) 142/76 (!) 147/74  Pulse: 91 (!) 108 (!) 110 (!) 107  Resp: 18 18 (!) 27 20  Temp: 99.3 F (37.4 C) 99.5 F (37.5 C) 98.7 F (37.1 C) 98.9 F (37.2 C)  TempSrc: Oral Oral Oral   SpO2: 100% 99% 100% 97%  Weight:        Intake/Output Summary (Last 24 hours) at 01/26/2020 1002 Last data filed at 01/26/2020 0329 Gross per 24 hour  Intake --  Output 1650 ml  Net -1650 ml   Filed Weights   01/16/20 0444 01/19/20 0500 01/20/20 0500  Weight: 80.5 kg 77 kg 76 kg   Exam  General: Well developed, well nourished, NAD, appears stated age  80: NCAT,mucous membranes moist.   Cardiovascular: S1 S2 auscultated, RRR  Respiratory: Clear to auscultation bilaterally   Abdomen: Soft, nontender, nondistended, + bowel sounds  Extremities: warm dry without cyanosis clubbing or edema  Neuro: Opens eyes, no purposeful movement- does not respond to commands   Data Reviewed: I have personally reviewed following labs and imaging studies  CBC: Recent Labs  Lab 01/20/20 0407 01/24/20 0407 01/25/20 0447 01/26/20 0521  WBC 5.9 6.3 5.6  --   HGB 9.3* 8.5* 8.3* 8.5*  HCT 29.9* 27.3* 25.8* 27.0*  MCV 84.7 84.3 84.3  --   PLT 156 216 192  --    Basic Metabolic Panel: Recent Labs  Lab 01/20/20 0407 01/24/20 0407 01/25/20 0447 01/26/20 0521  NA 135  136 137 139  K 4.6 5.7* 5.7* 5.9*  CL 110 111 110 110  CO2 19* 19* 20* 21*  GLUCOSE 207* 257* 233* 181*  BUN 44* 79* 88* 86*  CREATININE 1.69* 2.23* 2.27* 2.07*  CALCIUM 8.1* 8.4* 8.4* 8.3*  MG 2.0 2.7*  --   --   PHOS  --  5.1*  --   --    GFR: Estimated Creatinine Clearance: 24.9 mL/min (A) (by C-G formula based on SCr of 2.07 mg/dL (H)). Liver Function Tests: Recent Labs  Lab 01/20/20 0407 01/24/20 0407 01/25/20 0447  AST _0 ALT 38 34 35  ALKPHOS 51 50 53  BILITOT 0.5 0.7 0.7  PROT 8.1 7.4 7.1  ALBUMIN 1.9* 1.9* 1.9*   No results for input(s): LIPASE, AMYLASE in the last 168 hours. No results for input(s): AMMONIA in the last 168 hours. Coagulation Profile: No results for input(s): INR, PROTIME in the last 168 hours. Cardiac Enzymes: No results for input(s): CKTOTAL, CKMB, CKMBINDEX, TROPONINI  in the last 168 hours. BNP (last 3 results) No results for input(s): PROBNP in the last 8760 hours. HbA1C: No results for input(s): HGBA1C in the last 72 hours. CBG: Recent Labs  Lab 01/25/20 1541 01/25/20 2017 01/25/20 2337 01/26/20 0330 01/26/20 0749  GLUCAP 232* 219* 180* 131* 166*   Lipid Profile: No results for input(s): CHOL, HDL, LDLCALC, TRIG, CHOLHDL, LDLDIRECT in the last 72 hours. Thyroid Function Tests: Recent Labs    01/24/20 0407  TSH 1.815   Anemia Panel: Recent Labs    01/24/20 0407  VITAMINB12 634  FOLATE 12.8   Urine analysis:    Component Value Date/Time   COLORURINE STRAW (A) 02/26/2019 2119   APPEARANCEUR CLEAR 02/26/2019 2119   LABSPEC 1.009 02/26/2019 2119   PHURINE 5.0 02/26/2019 2119   GLUCOSEU NEGATIVE 02/26/2019 2119   HGBUR NEGATIVE 02/26/2019 2119   BILIRUBINUR negative 09/28/2019 1556   KETONESUR 5 (A) 02/26/2019 2119   PROTEINUR Positive (A) 09/28/2019 1556   PROTEINUR 30 (A) 02/26/2019 2119   UROBILINOGEN 0.2 09/28/2019 1556   NITRITE negative 09/28/2019 1556   NITRITE NEGATIVE 02/26/2019 2119   LEUKOCYTESUR  Trace (A) 09/28/2019 1556   LEUKOCYTESUR NEGATIVE 02/26/2019 2119   Sepsis Labs: _0 (procalcitonin:4,lacticidven:4)  ) Recent Results (from the past 240 hour(s))  CSF culture     Status: None   Collection Time: 01/16/20 12:14 PM   Specimen: PATH Cytology CSF; Cerebrospinal Fluid  Result Value Ref Range Status   Specimen Description CSF  Final   Special Requests NONE  Final   Gram Stain   Final    WBC PRESENT,BOTH PMN AND MONONUCLEAR NO ORGANISMS SEEN CYTOSPIN SMEAR    Culture   Final    NO GROWTH Performed at [facility] [address], [place], [place] [address]    Report Status 01/19/2020 FINAL  Final      Radiology Studies: No results found.   Scheduled Meds: . amLODipine  10 mg Per Tube Daily  . chlorhexidine  15 mL Mouth Rinse BID  . free water  170 mL Per Tube Q4H  . heparin  5,000 Units Subcutaneous Q8H  . insulin aspart  0-15 Units Subcutaneous Q4H  . insulin aspart  4 Units Subcutaneous Q4H  . insulin detemir  12 Units Subcutaneous BID  . lidocaine  1 patch Transdermal Q24H  . mouth rinse  15 mL Mouth Rinse q12n4p  . metoprolol tartrate  100 mg Per Tube BID  . sennosides  5 mL Per Tube BID  . sodium zirconium cyclosilicate  10 g Oral Daily  . thiamine  100 mg Per Tube Daily   Continuous Infusions: . feeding supplement (GLUCERNA 1.5 CAL) 1,000 mL (01/25/20 2021)     LOS: 13 days   Time Spent in minutes   45 minutes  Jearlene Bridwell D.O. on 01/26/2020 at 10:02 AM  Between 7am to 7pm - Please see pager noted on [email or web address]  After 7pm go to [email or web address]  And look for the night coverage person covering for me after hours  [facility] Office  [phone]

## 2020-01-26 NOTE — Plan of Care (Signed)
  Problem: Activity: Goal: Risk for activity intolerance will decrease Outcome: Progressing   Problem: Coping: Goal: Level of anxiety will decrease Outcome: Progressing

## 2020-01-26 NOTE — Progress Notes (Signed)
Orthopedic Tech Progress Note Patient Details:  Miranda Roman 08-May-1946 [id number] Ordered Bilateral Resting Hand Splints. Patient ID: Miranda Roman, female   DOB: August 31, 1945, 74 y.o.   MRN: [id number]   Tammy Sours 01/26/2020, 10:34 AM

## 2020-01-26 NOTE — Progress Notes (Signed)
Physical Therapy Treatment Patient Details Name: Miranda Roman MRN: [id number] DOB: 1945-11-11 Today's Date: 01/26/2020    History of Present Illness Pateint is a 74 year old female. 3 weeks prior she injured her foot tubing on vacation. The next day she began to have chest pain and quickly developed acute encephalopathy. She was flown from [place] (on vacation) to [place] where she lives. 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 The pt demonstrated slightly improved tolerance for stretching while seated at the EOB, but remains limited in further progression of activity and strengthening by continued difficulty with command following. The pt remained agreeable and not in obvious distress throughout the session, but showed signs of fatigue after ~15 min of sitting EOB with maxA of 1 for support. The pt will continue to benefit from skilled PT to further progress seated balance and activity tolerance.    Follow Up Recommendations  SNF     Equipment Recommendations  None recommended by PT    Recommendations for Other Services       Precautions / Restrictions Precautions Precautions: Fall;Other (comment) Precaution Comments: feeding tube Restrictions Weight Bearing Restrictions: No    Mobility  Bed Mobility Overal bed mobility: Needs Assistance Bed Mobility: Sit to Supine;Supine to Sit;Rolling Rolling: Total assist;+2 for physical assistance   Supine to sit: Total assist;+2 for physical assistance;HOB elevated Sit to supine: Total assist;+2 for physical assistance;HOB elevated   General bed mobility comments: total A +2 for all aspects of bed mobility, utilized bed pad and helicopter method to transition pt from supine<>sit  Transfers Overall transfer level:  (deferred today due to inability to follow commands/actively use extremities)                General transfer comment: unable to safely attempt     Balance Overall balance assessment: Needs assistance Sitting-balance support: No upper extremity supported;Feet supported Sitting balance-Leahy Scale: Zero Sitting balance - Comments: pt requires total A to maintain sitting balance Postural control: Left lateral lean;Posterior lean                                  Cognition Arousal/Alertness: Awake/alert Behavior During Therapy: Flat affect Overall Cognitive Status: Impaired/Different from baseline Area of Impairment: Following commands;Attention                   Current Attention Level: Focused   Following Commands: Follows one step commands inconsistently     Problem Solving: Slow processing;Difficulty sequencing;Decreased initiation General Comments: pt continues to be unable to follow command, some grimacing to pain, otherwise not very interactive with facial expressions or noises      Exercises General Exercises - Lower Extremity Long Arc Quad: PROM;Both;10 reps;Seated Heel Slides: PROM;Both;10 reps;Seated Other Exercises Other Exercises: cervical PROM rotation R<>L from sitting EOB Other Exercises: PROM flexion/ extension  to bilateral wrists    General Comments General comments (skin integrity, edema, etc.): daughter present during session, helpful and supportive. HR increased to 120 bpm sitting EOB, down to 112 bpm supine in bed      Pertinent Vitals/Pain Pain Assessment: Faces Faces Pain Scale: Hurts even more Pain Location: general discomfort all over when transitioning to EOB Pain Descriptors / Indicators: Grimacing;Discomfort Pain Intervention(s): Limited activity within patient's tolerance;Monitored during session;RN gave pain meds during session;Repositioned;Relaxation  PT Goals (current goals can now be found in the care plan section) Acute Rehab PT Goals Patient Stated Goal: pt unable to state.  Family is hoping  that she will improve and be able to engage and be as independent as possible  PT Goal Formulation: With family Time For Goal Achievement: 02/04/20 Progress towards PT goals: Progressing toward goals (slowly)    Frequency    Min 2X/week      PT Plan Current plan remains appropriate    Co-evaluation PT/OT/SLP Co-Evaluation/Treatment: Yes Reason for Co-Treatment: Complexity of the patient's impairments (multi-system involvement);To address functional/ADL transfers PT goals addressed during session: Mobility/safety with mobility;Balance        AM-PAC PT "6 Clicks" Mobility   Outcome Measure  Help needed turning from your back to your side while in a flat bed without using bedrails?: Total Help needed moving from lying on your back to sitting on the side of a flat bed without using bedrails?: Total Help needed moving to and from a bed to a chair (including a wheelchair)?: Total Help needed standing up from a chair using your arms (e.g., wheelchair or bedside chair)?: Total Help needed to walk in hospital room?: Total Help needed climbing 3-5 steps with a railing? : Total 6 Click Score: 6    End of Session   Activity Tolerance: Patient limited by fatigue Patient left: in bed;with call bell/phone within reach;with bed alarm set;with family/visitor present Nurse Communication: Mobility status PT Visit Diagnosis: Other abnormalities of gait and mobility (R26.89);Muscle weakness (generalized) (M62.81);Other symptoms and signs involving the nervous system (W03.794)     Time: 4461-9012 PT Time Calculation (min) (ACUTE ONLY): 28 min  Charges:  $Therapeutic Activity: 8-22 mins                     Karma Ganja, PT, DPT   Acute Rehabilitation Department Pager #: [phone]   Otho Bellows 01/26/2020, 10:56 AM

## 2020-01-27 DIAGNOSIS — I10 Essential (primary) hypertension: Secondary | ICD-10-CM | POA: Diagnosis not present

## 2020-01-27 DIAGNOSIS — E1122 Type 2 diabetes mellitus with diabetic chronic kidney disease: Secondary | ICD-10-CM | POA: Diagnosis not present

## 2020-01-27 DIAGNOSIS — G9340 Encephalopathy, unspecified: Secondary | ICD-10-CM | POA: Diagnosis not present

## 2020-01-27 DIAGNOSIS — E6609 Other obesity due to excess calories: Secondary | ICD-10-CM | POA: Diagnosis not present

## 2020-01-27 LAB — HEMOGLOBIN AND HEMATOCRIT, BLOOD
HCT: 29.4 % — ABNORMAL LOW (ref 36.0–46.0)
Hemoglobin: 9.2 g/dL — ABNORMAL LOW (ref 12.0–15.0)

## 2020-01-27 LAB — GLUCOSE, CAPILLARY
Glucose-Capillary: 126 mg/dL — ABNORMAL HIGH (ref 70–99)
Glucose-Capillary: 129 mg/dL — ABNORMAL HIGH (ref 70–99)
Glucose-Capillary: 137 mg/dL — ABNORMAL HIGH (ref 70–99)
Glucose-Capillary: 146 mg/dL — ABNORMAL HIGH (ref 70–99)
Glucose-Capillary: 148 mg/dL — ABNORMAL HIGH (ref 70–99)
Glucose-Capillary: 151 mg/dL — ABNORMAL HIGH (ref 70–99)
Glucose-Capillary: 167 mg/dL — ABNORMAL HIGH (ref 70–99)

## 2020-01-27 LAB — BASIC METABOLIC PANEL
Anion gap: 8 (ref 5–15)
BUN: 96 mg/dL — ABNORMAL HIGH (ref 8–23)
CO2: 22 mmol/L (ref 22–32)
Calcium: 8.5 mg/dL — ABNORMAL LOW (ref 8.9–10.3)
Chloride: 109 mmol/L (ref 98–111)
Creatinine, Ser: 2.05 mg/dL — ABNORMAL HIGH (ref 0.44–1.00)
GFR calc Af Amer: 27 mL/min — ABNORMAL LOW (ref >60)
GFR calc non Af Amer: 24 mL/min — ABNORMAL LOW (ref >60)
Glucose, Bld: 156 mg/dL — ABNORMAL HIGH (ref 70–99)
Potassium: 5.4 mmol/L — ABNORMAL HIGH (ref 3.5–5.1)
Sodium: 139 mmol/L (ref 135–145)

## 2020-01-27 NOTE — Progress Notes (Signed)
PROGRESS NOTE    Miranda Roman  UJW:[id number] DOB: 08/24/1945 DOA: 01/13/2020 PCP: Glendale Chard, MD   Brief Narrative:  74 year old with a history of DM2, CKD stage IV, Custer trait, B thalasemia, and prior bariatric surgery who was transferred to [facility] from [facility] where she originally presented 12/21/2019 with complaints of chest pain.  A VQ scan and Dopplers were negative at that facility, and a cardiac work-up revealed only moderate pulmonary hypertension.  Her hospital course was complicated by the sudden onset of encephalopathy leukocytosis and fever.  ID and Neurology were consulted and the patient underwent MRI of the brain and a lumbar puncture, both of which were unrevealing.  EEG was unrevealing.  Her case was further complicated by anemia, thrombocytopenia, and acute kidney injury with consideration being given to possible TTP.  She was treated with 5 rounds of plasmapheresis and steroids without improvement.  Adam TS 13 was normal.  Bone marrow biopsy was performed.  At request of the family she was transferred to [facility].  Work-up after arrival to [facility] has included a negative long-term EEG, negative MRI brain, and negative repeat LP.  She had a repeat course of IVIG x5 days. 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 -MRI brain 7/11 negative -Patient was on Keppra 500 mg IV twice daily.  Discussed with son and he states that he feels that she may be more sleepy due to this.  Discussed with neurology on 01/25/2020, Dr. Lorraine Lax, recommended discontinuing and not tapering Keppra and monitoring closely for seizures.  No need to start stimulants at this time.  -Repeat LP 7/12 negative.  -Patient has completed 5 days of IVIG, which was started on 01/16/2020  -Neurology consulted and appreciated, has no further recommendations at this time.  We will continue to follow her the next several days to see if there is any improvement from the completed course of IVIG.  However effects may take 2 to 4 weeks. -Per neurology, Dr. Arrie Eastern note from 7/22, feels that Warnicke's could be higher on the differential given her acute decline following hospitalization.  Recommend to continue supportive care and follow-up on CSF autoimmune encephalitis panel/paraneoplastic panel.  Palliative care consult. -Discussed earlier today via phone with the daughter, will continue to be optimistic however will also be realistic.  We will continue to follow through the weekend and see how patient progresses.  Patient was tracking with me this morning however not past the midpoint into the left.  She did also move her left hand to command.  Loose stools -Secondary to tube feeding with no evidence of C. difficile clinically  Hyponatremia due to mild dehydration -Resolved, continue to monitor BMP  Diabetes mellitus, type II -Patient normally uses an insulin pump at baseline -Hemoglobin A1c 7.1 (on 01/14/2020) -Continue Levemir, insulin sliding scale and CBG monitoring   Vitamin D deficiency -Vitamin D, 25-hydroxy currently 43.42 -within normal limits   Anemia of chronic disease/ Sickle cell trait / Beta thalassemia -Hemoglobin currently 8.5 -Patient did receive 1 unit PRBC on 01/17/2020 -Pending CBC today  Chronic kidney disease, stage IV -Baseline creatinine approximately 2.4, currently creatinine 2.07 -Pending BMP today  Hyperkalemia -Despite lokelma given on 7/21, potassium  up to 5.9 7/22 -pending BMP today  Essential hypertension -Continue amlodipine, metoprolol -BP stable  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 Daughter via phone.  Disposition Plan:  Status is: Inpatient  Remains inpatient appropriate because:Altered mental status, IV treatments appropriate due to intensity of illness or inability to take PO and Inpatient level of care appropriate due to severity of illness   Dispo: The patient is from: Home              Anticipated d/c is to: TBD              Anticipated d/c date is: > 3 days              Patient currently is not medically stable to d/c.   Consultants Neurology   Procedures  EEG Lumbar puncture Cortrak  Antibiotics   Anti-infectives (From admission, onward)   None      Subjective:   Miranda Roman seen and examined today.   Eyes are open. I did ask patient to move her left hand twice, which she did do.   Objective:   Vitals:   01/26/20 2321 01/27/20 0440 01/27/20 0814 01/27/20 1212  BP: (!) 122/61 120/69 (!) 148/76 110/67  Pulse: 90 105 104 93  Resp: _0 (!) 24  Temp: 99.3 F (37.4 C) 100 F (37.8 C) 98.6 F (37 C) 98.4 F (36.9 C)  TempSrc: Oral Axillary Oral Oral  SpO2: 100% 100% 100% 99%  Weight:        Intake/Output Summary (Last 24 hours) at 01/27/2020 1304 Last data filed at 01/27/2020 0900 Gross per 24 hour  Intake 1725 ml  Output 1200 ml  Net 525 ml   Filed Weights   01/16/20 0444 01/19/20 0500 01/20/20 0500  Weight: 80.5 kg 77 kg 76 kg   Exam  General: Well developed, well nourished, NAD, appears stated age  67: NCAT,mucous membranes moist.   Cardiovascular: S1 S2 auscultated, RRR  Respiratory:  Clear to auscultation bilaterally   Abdomen: Soft, nontender, nondistended, + bowel sounds  Extremities: warm dry without cyanosis clubbing or edema  Neuro: Eyes are open, she does track, but not to the left. She did move her left hand twice on command.   Data Reviewed: I have personally reviewed following labs and imaging studies  CBC: Recent Labs  Lab 01/24/20 0407 01/25/20 0447 01/26/20 0521  WBC 6.3 5.6  --   HGB 8.5* 8.3* 8.5*  HCT 27.3* 25.8* 27.0*  MCV 84.3 84.3  --   PLT 216 192  --    Basic Metabolic Panel: Recent Labs  Lab 01/24/20 0407 01/25/20 0447 01/26/20 0521  NA 136 137 139  K 5.7* 5.7* 5.9*  CL 111 110 110  CO2 19* 20* 21*  GLUCOSE 257* 233* 181*  BUN 79* 88* 86*  CREATININE 2.23* 2.27* 2.07*  CALCIUM 8.4* 8.4* 8.3*  MG 2.7*  --   --   PHOS 5.1*  --   --    GFR: Estimated Creatinine Clearance: 24.9 mL/min (A) (by C-G formula based on SCr of 2.07 mg/dL (H)).  Liver Function Tests: Recent Labs  Lab 01/24/20 0407 01/25/20 0447  AST 25 27  ALT 34 35  ALKPHOS 50 53  BILITOT 0.7 0.7  PROT 7.4 7.1  ALBUMIN 1.9* 1.9*   No results for input(s): LIPASE, AMYLASE in the last 168 hours. No results for input(s): AMMONIA in the last 168 hours. Coagulation Profile: No results for input(s): INR, PROTIME in the last 168 hours. Cardiac Enzymes: No results for input(s): CKTOTAL, CKMB, CKMBINDEX, TROPONINI in the last 168 hours. BNP (last 3 results) No results for input(s): PROBNP in the last 8760 hours. HbA1C: No results for input(s): HGBA1C in the last 72 hours. CBG: Recent Labs  Lab 01/26/20 1652 01/26/20 2042 01/27/20 0027 01/27/20 0445 01/27/20 0801  GLUCAP 176* 184* 137* 167* 146*   Lipid Profile: No results for input(s): CHOL, HDL, LDLCALC, TRIG, CHOLHDL, LDLDIRECT in the last 72 hours. Thyroid Function Tests: No results for input(s): TSH, T4TOTAL, FREET4, T3FREE, THYROIDAB in the last 72 hours. Anemia Panel: No results for input(s):  VITAMINB12, FOLATE, FERRITIN, TIBC, IRON, RETICCTPCT in the last 72 hours. Urine analysis:    Component Value Date/Time   COLORURINE STRAW (A) 02/26/2019 2119   APPEARANCEUR CLEAR 02/26/2019 2119   LABSPEC 1.009 02/26/2019 2119   PHURINE 5.0 02/26/2019 2119   GLUCOSEU NEGATIVE 02/26/2019 2119   HGBUR NEGATIVE 02/26/2019 2119   BILIRUBINUR negative 09/28/2019 1556   KETONESUR 5 (A) 02/26/2019 2119   PROTEINUR Positive (A) 09/28/2019 1556   PROTEINUR 30 (A) 02/26/2019 2119   UROBILINOGEN 0.2 09/28/2019 1556   NITRITE negative 09/28/2019 1556   NITRITE NEGATIVE 02/26/2019 2119   LEUKOCYTESUR Trace (A) 09/28/2019 1556   LEUKOCYTESUR NEGATIVE 02/26/2019 2119   Sepsis Labs: _0 (procalcitonin:4,lacticidven:4)  ) No results found for this or any previous visit (from the past 240 hour(s)).    Radiology Studies: No results found.   Scheduled Meds: . amLODipine  10 mg Per Tube Daily  . chlorhexidine  15 mL Mouth Rinse BID  . free water  170 mL Per Tube Q4H  . heparin  5,000 Units Subcutaneous Q8H  . insulin aspart  0-15 Units Subcutaneous Q4H  . insulin aspart  4 Units Subcutaneous Q4H  . insulin detemir  12 Units Subcutaneous BID  . lidocaine  1 patch Transdermal Q24H  . mouth rinse  15 mL Mouth Rinse q12n4p  . metoprolol tartrate  100 mg Per Tube BID  . sennosides  5 mL Per Tube BID  . thiamine  100 mg Per Tube Daily   Continuous Infusions: . feeding supplement (GLUCERNA 1.5 CAL) 1,000 mL (01/26/20 1737)     LOS: 14 days   Time Spent in minutes   30 minutes    D.O. on 01/27/2020 at 1:04 PM  Between 7am to 7pm - Please see pager noted on [email or web address]  After 7pm go to [email or web address]  And look for the night coverage person covering for me after hours  [facility] Office  [phone]

## 2020-01-27 NOTE — Progress Notes (Signed)
Occupational Therapy Treatment Patient Details Name: Miranda Roman MRN: [id number] DOB: 01-24-1946 Today's Date: 01/27/2020    History of present illness Pateint is a 74 year old female. 3 weeks prior she injured her foot tubing on vacation. The next day she began to have chest pain and quickly developed acute encephalopathy. She was flown from [place] (on vacation) to [place] where she lives. 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 Session focus on family education related to PROM HEP for BUEs. Provided demonstration as well as handout of exercises indicated below. Education provided on completing therex within pts pain tolerance. Additionally, posted resting hand splint schedule in room to facilitate compliance with splints. Dc plan remains appropriate, will follow acutely per POC.    Follow Up Recommendations  LTACH    Equipment Recommendations  None recommended by OT    Recommendations for Other Services      Precautions / Restrictions Precautions Precautions: Fall;Other (comment) Precaution Comments: feeding tube Required Braces or Orthoses:  (bilateral resting hand splints to be worn at night) Restrictions Weight Bearing Restrictions: No       Mobility Bed Mobility                  Transfers                      Balance                                           ADL either performed or assessed with clinical judgement   ADL Overall ADL's : Needs assistance/impaired                                       General ADL Comments: session focus on BUE PROM HEP and splint check     Vision       Perception     Praxis      Cognition Arousal/Alertness: Awake/alert Behavior During Therapy: Flat affect Overall Cognitive Status: Impaired/Different from baseline Area of Impairment: Following  commands;Attention;Problem solving                   Current Attention Level: Focused   Following Commands: Follows one step commands inconsistently     Problem Solving: Slow processing;Difficulty sequencing;Decreased initiation General Comments: unable to follow commands, bending elbows spontaneosuly but no purposeful movement noted.        Exercises General Exercises - Upper Extremity Shoulder Flexion: PROM;Both;5 reps;Supine Shoulder Extension: AROM;Both;5 reps;Supine Elbow Flexion: PROM;Both;5 reps;Supine Elbow Extension: PROM;Both;5 reps;Supine Wrist Flexion: PROM;Both;5 reps;Supine Wrist Extension: PROM;Both;5 reps;Supine Digit Composite Flexion: PROM;Both;5 reps;Supine Composite Extension: PROM;Both;5 reps;Supine Hand Exercises Forearm Supination: PROM;Both;5 reps;Supine Forearm Pronation: PROM;Both;5 reps;Supine Wrist Ulnar Deviation: PROM;Both;5 reps;Supine Wrist Radial Deviation: PROM;Both;5 reps;Supine   Shoulder Instructions       General Comments pts son present during session, helpful and supportive. issued son HEP for PROM to [place]. posted splint wearing schedule in room. No skin breakdown or redness noted after wearing splints >8 hours    Pertinent Vitals/ Pain       Pain Assessment: Faces Faces Pain Scale: Hurts little more Pain Location: grimacing to finger flexion on Bil hands Pain Descriptors / Indicators:  Grimacing;Discomfort Pain Intervention(s): Limited activity within patient's tolerance;Monitored during session;Repositioned  Home Living                                          Prior Functioning/Environment              Frequency  Min 2X/week        Progress Toward Goals  OT Goals(current goals can now be found in the care plan section)  Progress towards OT goals: Progressing toward goals  Acute Rehab OT Goals Patient Stated Goal: pt unable to state.  Family is hoping that she will improve and be able to  engage and be as independent as possible  OT Goal Formulation: With patient Time For Goal Achievement: 02/03/20 Potential to Achieve Goals: [place] Discharge plan remains appropriate;Frequency remains appropriate    Co-evaluation                 AM-PAC OT "6 Clicks" Daily Activity     Outcome Measure   Help from another person eating meals?: Total Help from another person taking care of personal grooming?: Total Help from another person toileting, which includes using toliet, bedpan, or urinal?: Total Help from another person bathing (including washing, rinsing, drying)?: Total Help from another person to put on and taking off regular upper body clothing?: Total Help from another person to put on and taking off regular lower body clothing?: Total 6 Click Score: 6    End of Session    OT Visit Diagnosis: Cognitive communication deficit (R41.841)   Activity Tolerance Patient tolerated treatment well   Patient Left in bed;with call bell/phone within reach;with bed alarm set;with family/visitor present   Nurse Communication Mobility status;Other (comment) (splint wearing schedule)        Time: 2563-8937 OT Time Calculation (min): 14 min  Charges: OT Treatments $Therapeutic Exercise: 8-22 mins  Lanier Clam., COTA/L Acute Rehabilitation Services [phone] [place] 01/27/2020, 1:12 PM

## 2020-01-28 DIAGNOSIS — E6609 Other obesity due to excess calories: Secondary | ICD-10-CM | POA: Diagnosis not present

## 2020-01-28 DIAGNOSIS — E1122 Type 2 diabetes mellitus with diabetic chronic kidney disease: Secondary | ICD-10-CM | POA: Diagnosis not present

## 2020-01-28 DIAGNOSIS — G9340 Encephalopathy, unspecified: Secondary | ICD-10-CM | POA: Diagnosis not present

## 2020-01-28 DIAGNOSIS — I10 Essential (primary) hypertension: Secondary | ICD-10-CM | POA: Diagnosis not present

## 2020-01-28 LAB — GLUCOSE, CAPILLARY
Glucose-Capillary: 137 mg/dL — ABNORMAL HIGH (ref 70–99)
Glucose-Capillary: 140 mg/dL — ABNORMAL HIGH (ref 70–99)
Glucose-Capillary: 141 mg/dL — ABNORMAL HIGH (ref 70–99)
Glucose-Capillary: 160 mg/dL — ABNORMAL HIGH (ref 70–99)
Glucose-Capillary: 175 mg/dL — ABNORMAL HIGH (ref 70–99)
Glucose-Capillary: 98 mg/dL (ref 70–99)

## 2020-01-28 LAB — BASIC METABOLIC PANEL
Anion gap: 10 (ref 5–15)
BUN: 94 mg/dL — ABNORMAL HIGH (ref 8–23)
CO2: 22 mmol/L (ref 22–32)
Calcium: 8.1 mg/dL — ABNORMAL LOW (ref 8.9–10.3)
Chloride: 108 mmol/L (ref 98–111)
Creatinine, Ser: 2.02 mg/dL — ABNORMAL HIGH (ref 0.44–1.00)
GFR calc Af Amer: 28 mL/min — ABNORMAL LOW (ref >60)
GFR calc non Af Amer: 24 mL/min — ABNORMAL LOW (ref >60)
Glucose, Bld: 180 mg/dL — ABNORMAL HIGH (ref 70–99)
Potassium: 5.6 mmol/L — ABNORMAL HIGH (ref 3.5–5.1)
Sodium: 140 mmol/L (ref 135–145)

## 2020-01-28 MED ORDER — SODIUM ZIRCONIUM CYCLOSILICATE 10 G PO PACK
10.0000 g | PACK | Freq: Once | ORAL | Status: AC
  Administered 2020-01-28: 10 g

## 2020-01-28 MED ORDER — SODIUM ZIRCONIUM CYCLOSILICATE 10 G PO PACK
10.0000 g | PACK | Freq: Once | ORAL | Status: DC
  Filled 2020-01-28: qty 1

## 2020-01-28 NOTE — Plan of Care (Signed)
  Problem: Nutrition: Goal: Adequate nutrition will be maintained Outcome: Progressing   Problem: Safety: Goal: Ability to remain free from injury will improve Outcome: Progressing   Problem: Skin Integrity: Goal: Risk for impaired skin integrity will decrease Outcome: Progressing

## 2020-01-28 NOTE — Progress Notes (Signed)
Interim Note  - Spoke to patient's son at the request of the daughter. Explained that there is no further testing or treatments we can offer at this point, unless Autoimmune encephalitis?Paraneoplastic panel comes back positive. Next decision if whether to place J tube or not, recommended family consider speaking with palliative team to help make goals of care decisions if patient does not  improve.

## 2020-01-28 NOTE — Progress Notes (Signed)
PROGRESS NOTE    Miranda Roman  CZY:[id number] DOB: 02-Apr-1946 DOA: 01/13/2020 PCP: Glendale Chard, MD   Brief Narrative:  74 year old with a history of DM2, CKD stage IV, [place] trait, B thalasemia, and prior bariatric surgery who was transferred to [facility] from [facility] where she originally presented 12/21/2019 with complaints of chest pain.  A VQ scan and Dopplers were negative at that facility, and a cardiac work-up revealed only moderate pulmonary hypertension.  Her hospital course was complicated by the sudden onset of encephalopathy leukocytosis and fever.  ID and Neurology were consulted and the patient underwent MRI of the brain and a lumbar puncture, both of which were unrevealing.  EEG was unrevealing.  Her case was further complicated by anemia, thrombocytopenia, and acute kidney injury with consideration being given to possible TTP.  She was treated with 5 rounds of plasmapheresis and steroids without improvement.  Adam TS 13 was normal.  Bone marrow biopsy was performed.  At request of the family she was transferred to [facility]  Work-up after arrival to [facility] has included a negative long-term EEG, negative MRI brain, and negative repeat LP.  She had a repeat course of IVIG x5 days. 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 -MRI brain 7/11 negative -Patient was on Keppra 500 mg IV twice daily.  Discussed with son and he states that he feels that she may be more sleepy due to this.  Discussed with neurology on 01/25/2020, Dr. Lorraine Lax, recommended discontinuing and not tapering Keppra and monitoring closely for seizures.  No need to start stimulants at this time.  -Repeat LP 7/12 negative.  -Patient has completed 5 days of IVIG, which was started on 01/16/2020  -Neurology consulted and appreciated, has no further recommendations at this time.  We will continue to follow her the next several days to see if there is any improvement from the completed course of IVIG.  However effects may take 2 to 4 weeks. -Per neurology, Dr. Arrie Eastern note from 7/22, feels that Warnicke's could be higher on the differential given her acute decline following hospitalization.  Recommend to continue supportive care and follow-up on CSF autoimmune encephalitis panel/paraneoplastic panel.  Palliative care consult. -Discussed earlier today via phone with the daughter, will continue to be optimistic however will also be realistic.  We will continue to follow through the weekend and see how patient progresses.  -Today patient is able to track me around the room. Moves arms. Was able to grip my fingers on command.   Loose stools -Secondary to tube feeding with no evidence of C. difficile clinically  Hyponatremia due to mild dehydration -Resolved, continue to monitor BMP  Diabetes mellitus, type II -Patient normally uses an insulin pump at baseline -Hemoglobin A1c 7.1 (on 01/14/2020) -Continue Levemir, insulin sliding scale and CBG monitoring   Vitamin D deficiency -Vitamin D, 25-hydroxy currently 43.42 -within normal limits   Anemia of chronic disease/ Sickle cell trait / Beta thalassemia -Hemoglobin currently 8.5 -Patient did receive 1 unit PRBC on 01/17/2020 -Continue to monitor CBC  Chronic kidney disease, stage IV -Baseline creatinine approximately 2.4, currently creatinine 2.02 -Continue to monitor BMP  Hyperkalemia -Despite several doses of lokelma, potassium 5.6 today -  will give additional dose and monitor BMP  Essential hypertension -Continue amlodipine, metoprolol -BP stable  Mild to moderate protein calorie  malnutrition with dysphagia -Not a candidate for PEG tube given history of gastric bypass  -will have to consider J-tube per general surgery if long-term tube feeds requirement -s/p Cortrak placement -Currently on glucerna  L1-L5 vertebral body/sacral sclerosis -Noted per Hematology at [facility] -felt to be due to sickle cell trait  DVT Prophylaxis  heparin  Code Status: Full  Family Communication: None at bedside. Daughter via phone.  Disposition Plan:  Status is: Inpatient  Remains inpatient appropriate because:Altered mental status, IV treatments appropriate due to intensity of illness or inability to take PO and Inpatient level of care appropriate due to severity of illness   Dispo: The patient is from: Home              Anticipated d/c is to: TBD              Anticipated d/c date is: > 3 days              Patient currently is not medically stable to d/c.   Consultants Neurology   Procedures  EEG Lumbar puncture Cortrak  Antibiotics   Anti-infectives (From admission, onward)   None      Subjective:   Miranda Roman seen and examined today.   Eyes are open. I did ask patient to move her left hand and grip my fingers (which she did do).   Objective:   Vitals:   01/28/20 0316 01/28/20 0326 01/28/20 0326 01/28/20 0812  BP:  (!) 140/70 (!) 140/70 (!) 135/78  Pulse:  101 101 105  Resp:  _0 Temp:  98.6 F (37 C) 98.6 F (37 C) 98.4 F (36.9 C)  TempSrc:  Oral Oral Axillary  SpO2:  99% 99% 100%  Weight: 78.7 kg       Intake/Output Summary (Last 24 hours) at 01/28/2020 1226 Last data filed at 01/28/2020 0410 Gross per 24 hour  Intake 1220 ml  Output 1250 ml  Net -30 ml   Filed Weights   01/19/20 0500 01/20/20 0500 01/28/20 0316  Weight: 77 kg 76 kg 78.7 kg   Exam  General: Well developed, well nourished, NAD, appears stated age  89: NCAT,mucous membranes moist.   Cardiovascular: S1 S2 auscultated, RRR  Respiratory: Clear to  auscultation bilaterally   Abdomen: Soft, nontender, nondistended, + bowel sounds  Extremities: warm dry without cyanosis clubbing or edema  Neuro: Eyes are open, she does track, but not to the left. She did move her left hand and grip my fingers per command.  Data Reviewed: I have personally reviewed following labs and imaging studies  CBC: Recent Labs  Lab 01/24/20 0407 01/25/20 0447 01/26/20 0521 01/27/20 1247  WBC 6.3 5.6  --   --   HGB 8.5* 8.3* 8.5* 9.2*  HCT 27.3* 25.8* 27.0* 29.4*  MCV 84.3 84.3  --   --   PLT 216 192  --   --    Basic Metabolic Panel: Recent Labs  Lab 01/24/20 0407 01/25/20 0447 01/26/20 0521 01/27/20 1247  NA 136 137 139 139  K 5.7* 5.7* 5.9* 5.4*  CL 111 110 110 109  CO2 19* 20* 21* 22  GLUCOSE 257* 233* 181* 156*  BUN 79* 88* 86* 96*  CREATININE 2.23* 2.27* 2.07* 2.05*  CALCIUM 8.4* 8.4* 8.3* 8.5*  MG 2.7*  --   --   --  PHOS 5.1*  --   --   --    GFR: Estimated Creatinine Clearance: 25.6 mL/min (A) (by C-G formula based on SCr of 2.05 mg/dL (H)). Liver Function Tests: Recent Labs  Lab 01/24/20 0407 01/25/20 0447  AST 25 27  ALT 34 35  ALKPHOS 50 53  BILITOT 0.7 0.7  PROT 7.4 7.1  ALBUMIN 1.9* 1.9*   No results for input(s): LIPASE, AMYLASE in the last 168 hours. No results for input(s): AMMONIA in the last 168 hours. Coagulation Profile: No results for input(s): INR, PROTIME in the last 168 hours. Cardiac Enzymes: No results for input(s): CKTOTAL, CKMB, CKMBINDEX, TROPONINI in the last 168 hours. BNP (last 3 results) No results for input(s): PROBNP in the last 8760 hours. HbA1C: No results for input(s): HGBA1C in the last 72 hours. CBG: Recent Labs  Lab 01/27/20 2051 01/27/20 2352 01/28/20 0313 01/28/20 0810 01/28/20 1220  GLUCAP 129* 126* 98 160* 175*   Lipid Profile: No results for input(s): CHOL, HDL, LDLCALC, TRIG, CHOLHDL, LDLDIRECT in the last 72 hours. Thyroid Function Tests: No results for input(s):  TSH, T4TOTAL, FREET4, T3FREE, THYROIDAB in the last 72 hours. Anemia Panel: No results for input(s): VITAMINB12, FOLATE, FERRITIN, TIBC, IRON, RETICCTPCT in the last 72 hours. Urine analysis:    Component Value Date/Time   COLORURINE STRAW (A) 02/26/2019 2119   APPEARANCEUR CLEAR 02/26/2019 2119   LABSPEC 1.009 02/26/2019 2119   PHURINE 5.0 02/26/2019 2119   GLUCOSEU NEGATIVE 02/26/2019 2119   HGBUR NEGATIVE 02/26/2019 2119   BILIRUBINUR negative 09/28/2019 1556   KETONESUR 5 (A) 02/26/2019 2119   PROTEINUR Positive (A) 09/28/2019 1556   PROTEINUR 30 (A) 02/26/2019 2119   UROBILINOGEN 0.2 09/28/2019 1556   NITRITE negative 09/28/2019 1556   NITRITE NEGATIVE 02/26/2019 2119   LEUKOCYTESUR Trace (A) 09/28/2019 1556   LEUKOCYTESUR NEGATIVE 02/26/2019 2119   Sepsis Labs: _0 (procalcitonin:4,lacticidven:4)  ) No results found for this or any previous visit (from the past 240 hour(s)).    Radiology Studies: No results found.   Scheduled Meds: . amLODipine  10 mg Per Tube Daily  . chlorhexidine  15 mL Mouth Rinse BID  . free water  170 mL Per Tube Q4H  . heparin  5,000 Units Subcutaneous Q8H  . insulin aspart  0-15 Units Subcutaneous Q4H  . insulin aspart  4 Units Subcutaneous Q4H  . insulin detemir  12 Units Subcutaneous BID  . lidocaine  1 patch Transdermal Q24H  . mouth rinse  15 mL Mouth Rinse q12n4p  . metoprolol tartrate  100 mg Per Tube BID  . sennosides  5 mL Per Tube BID  . thiamine  100 mg Per Tube Daily   Continuous Infusions: . feeding supplement (GLUCERNA 1.5 CAL) 1,000 mL (01/27/20 1625)     LOS: 15 days   Time Spent in minutes   30 minutes  Evamaria Detore D.O. on 01/28/2020 at 12:26 PM  Between 7am to 7pm - Please see pager noted on [email or web address]  After 7pm go to [email or web address]  And look for the night coverage person covering for me after hours  [facility] Office  [phone]

## 2020-01-29 DIAGNOSIS — I10 Essential (primary) hypertension: Secondary | ICD-10-CM | POA: Diagnosis not present

## 2020-01-29 DIAGNOSIS — E1122 Type 2 diabetes mellitus with diabetic chronic kidney disease: Secondary | ICD-10-CM | POA: Diagnosis not present

## 2020-01-29 DIAGNOSIS — E6609 Other obesity due to excess calories: Secondary | ICD-10-CM | POA: Diagnosis not present

## 2020-01-29 DIAGNOSIS — G9340 Encephalopathy, unspecified: Secondary | ICD-10-CM | POA: Diagnosis not present

## 2020-01-29 LAB — URINALYSIS, ROUTINE W REFLEX MICROSCOPIC
Bilirubin Urine: NEGATIVE
Glucose, UA: NEGATIVE mg/dL
Ketones, ur: NEGATIVE mg/dL
Nitrite: NEGATIVE
Protein, ur: 100 mg/dL — AB
RBC / HPF: >50 RBC/hpf — ABNORMAL HIGH (ref 0–5)
Specific Gravity, Urine: 1.012 (ref 1.005–1.030)
WBC, UA: >50 WBC/hpf — ABNORMAL HIGH (ref 0–5)
pH: 7 (ref 5.0–8.0)

## 2020-01-29 LAB — GLUCOSE, CAPILLARY
Glucose-Capillary: 132 mg/dL — ABNORMAL HIGH (ref 70–99)
Glucose-Capillary: 136 mg/dL — ABNORMAL HIGH (ref 70–99)
Glucose-Capillary: 127 mg/dL — ABNORMAL HIGH (ref 70–99)
Glucose-Capillary: 143 mg/dL — ABNORMAL HIGH (ref 70–99)
Glucose-Capillary: 131 mg/dL — ABNORMAL HIGH (ref 70–99)

## 2020-01-29 LAB — BASIC METABOLIC PANEL
Anion gap: 8 (ref 5–15)
BUN: 91 mg/dL — ABNORMAL HIGH (ref 8–23)
CO2: 26 mmol/L (ref 22–32)
Calcium: 8.1 mg/dL — ABNORMAL LOW (ref 8.9–10.3)
Chloride: 110 mmol/L (ref 98–111)
Creatinine, Ser: 1.95 mg/dL — ABNORMAL HIGH (ref 0.44–1.00)
GFR calc Af Amer: 29 mL/min — ABNORMAL LOW (ref >60)
GFR calc non Af Amer: 25 mL/min — ABNORMAL LOW (ref >60)
Glucose, Bld: 142 mg/dL — ABNORMAL HIGH (ref 70–99)
Potassium: 5.1 mmol/L (ref 3.5–5.1)
Sodium: 144 mmol/L (ref 135–145)

## 2020-01-29 LAB — HEMOGLOBIN AND HEMATOCRIT, BLOOD
HCT: 27.2 % — ABNORMAL LOW (ref 36.0–46.0)
Hemoglobin: 8.2 g/dL — ABNORMAL LOW (ref 12.0–15.0)

## 2020-01-29 NOTE — Plan of Care (Signed)
  Problem: Education: Goal: Knowledge of General Education information will improve Description: Including pain rating scale, medication(s)/side effects and non-pharmacologic comfort measures Outcome: Progressing   Problem: Safety: Goal: Ability to remain free from injury will improve Outcome: Progressing   Problem: Skin Integrity: Goal: Risk for impaired skin integrity will decrease Outcome: Progressing

## 2020-01-29 NOTE — Progress Notes (Signed)
PROGRESS NOTE    RONALD VINSANT  ERX:[id number] DOB: [birth year] DOA: 01/13/2020 PCP: Glendale Chard, MD   Brief Narrative:  74 year old with a history of DM2, CKD stage IV, [place] trait, B thalasemia, and prior bariatric surgery who was transferred to [facility] from [facility] where she originally presented 12/21/2019 with complaints of chest pain.  A VQ scan and Dopplers were negative at that facility, and a cardiac work-up revealed only moderate pulmonary hypertension.  Her hospital course was complicated by the sudden onset of encephalopathy leukocytosis and fever.  ID and Neurology were consulted and the patient underwent MRI of the brain and a lumbar puncture, both of which were unrevealing.  EEG was unrevealing.  Her case was further complicated by anemia, thrombocytopenia, and acute kidney injury with consideration being given to possible TTP.  She was treated with 5 rounds of plasmapheresis and steroids without improvement.  Adam TS 13 was normal.  Bone marrow biopsy was performed.  At request of the family she was transferred to [facility].  Work-up after arrival to [facility] has included a negative long-term EEG, negative MRI brain, and negative repeat LP.  She had a repeat course of IVIG x5 days. 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 -MRI brain 7/11 negative -Patient was on Keppra 500 mg IV twice daily.  Discussed with son and he states that he feels that she may be more sleepy due to this.  Discussed with neurology on 01/25/2020, Dr. Lorraine Lax, recommended discontinuing and not tapering Keppra and monitoring closely for seizures.  No need to start stimulants at this time.  -Repeat LP 7/12 negative.  -Patient has completed 5 days of IVIG, which was started on 01/16/2020  -Neurology consulted and appreciated, has no further recommendations at this time.  We will continue to follow her the next several days to see if there is any improvement from the completed course of IVIG.  However effects may take 2 to 4 weeks. -Per neurology, Dr. Arrie Eastern note from 7/22, feels that Warnicke's could be higher on the differential given her acute decline following hospitalization.  Recommend to continue supportive care and follow-up on CSF autoimmune encephalitis panel/paraneoplastic panel.  Palliative care consult. -Discussed earlier today via phone with the daughter, will continue to be optimistic however will also be realistic.  We will continue to follow through the weekend and see how patient progresses.  -Today patient is able to speak to me and say good morning.  She is able to follow commands, and moves both upper and lower extremities some.  Loose stools -Secondary to tube feeding with no evidence of C. difficile clinically  Hyponatremia due to mild dehydration -Resolved, continue to monitor BMP  Diabetes mellitus, type II -Patient normally uses an insulin pump at baseline -Hemoglobin A1c 7.1 (on 01/14/2020) -Continue Levemir, insulin sliding scale and CBG monitoring   Vitamin D deficiency -Vitamin D, 25-hydroxy currently 43.42 -within normal limits   Anemia of chronic disease/ Sickle cell trait / Beta thalassemia -Hemoglobin currently 8.2 -Patient did receive 1 unit PRBC on 01/17/2020 -Continue to monitor CBC  Chronic kidney disease, stage IV -Baseline creatinine approximately 2.4, currently creatinine 1.95 -Continue to monitor BMP  Hyperkalemia -Received several  doses of Lokelma, potassium currently down to 5.1  Essential hypertension -Continue amlodipine, metoprolol -BP stable  Mild to moderate protein  calorie malnutrition with dysphagia -Not a candidate for PEG tube given history of gastric bypass  -will have to consider J-tube per general surgery if long-term tube feeds requirement -s/p Cortrak placement -Currently on glucerna  L1-L5 vertebral body/sacral sclerosis -Noted per Hematology at [facility] -felt to be due to sickle cell trait  Asymptomatic bacteriuria -UA shows rare bacteria, large leukocytes, >50 WBC -Family concerned given that the urine was dark in color -Urine culture pending  DVT Prophylaxis  heparin  Code Status: Full  Family Communication: None at bedside. Daughter via phone.  Disposition Plan:  Status is: Inpatient  Remains inpatient appropriate because:Altered mental status, IV treatments appropriate due to intensity of illness or inability to take PO and Inpatient level of care appropriate due to severity of illness   Dispo: The patient is from: Home              Anticipated d/c is to: TBD              Anticipated d/c date is: > 3 days              Patient currently is not medically stable to d/c.   Consultants Neurology   Procedures  EEG Lumbar puncture Cortrak  Antibiotics   Anti-infectives (From admission, onward)   None      Subjective:   [name] seen and examined today.   Patient able to say good morning to me today.  Not answering many questions, although able to follow commands. Objective:   Vitals:   01/28/20 2329 01/29/20 0342 01/29/20 0404 01/29/20 0753  BP: (!) 104/49  126/71 102/74  Pulse: 93  96 (!) 106  Resp: _0 Temp: 100.2 F (37.9 C)  99.2 F (37.3 C) 98.7 F (37.1 C)  TempSrc: Oral  Oral Axillary  SpO2: 100%  100% 100%  Weight:  78.9 kg      Intake/Output Summary (Last 24 hours) at 01/29/2020 1419 Last data filed at 01/29/2020 0804 Gross per 24 hour  Intake --  Output 700 ml  Net -700 ml   Filed Weights   01/20/20 0500 01/28/20 0316 01/29/20 0342  Weight: 76 kg 78.7 kg 78.9 kg    Exam  General: Well developed, well nourished, NAD, appears stated age  38: NCAT,mucous membranes moist.   Cardiovascular: S1 S2 auscultated, RRR  Respiratory: Clear to auscultation bilaterally   Abdomen: Soft, nontender, nondistended, + bowel sounds  Extremities: warm dry without cyanosis clubbing or edema  Neuro: Was able to speak this morning, and state good morning, as well as her name.  Able to move extremities and follow commands.  Data Reviewed: I have personally reviewed following labs and imaging studies  CBC: Recent Labs  Lab 01/24/20 0407 01/25/20 0447 01/26/20 0521 01/27/20 1247 01/29/20 0424  WBC 6.3 5.6  --   --   --   HGB 8.5* 8.3* 8.5* 9.2* 8.2*  HCT 27.3* 25.8* 27.0* 29.4* 27.2*  MCV 84.3 84.3  --   --   --   PLT 216 192  --   --   --    Basic Metabolic Panel: Recent Labs  Lab 01/24/20 0407 01/24/20 0407 01/25/20 0447 01/26/20 0521 01/27/20 1247 01/28/20 1215 01/29/20 0424  NA 136   < > 137 139 139 140 144  K 5.7*   < > 5.7*  5.9* 5.4* 5.6* 5.1  CL 111   < > 110 110 109 108 110  CO2 19*   < > 20* 21* _0 GLUCOSE 257*   < > 233* 181* 156* 180* 142*  BUN 79*   < > 88* 86* 96* 94* 91*  CREATININE 2.23*   < > 2.27* 2.07* 2.05* 2.02* 1.95*  CALCIUM 8.4*   < > 8.4* 8.3* 8.5* 8.1* 8.1*  MG 2.7*  --   --   --   --   --   --   PHOS 5.1*  --   --   --   --   --   --    < > = values in this interval not displayed.   GFR: Estimated Creatinine Clearance: 27 mL/min (A) (by C-G formula based on SCr of 1.95 mg/dL (H)). Liver Function Tests: Recent Labs  Lab 01/24/20 0407 01/25/20 0447  AST 25 27  ALT 34 35  ALKPHOS 50 53  BILITOT 0.7 0.7  PROT 7.4 7.1  ALBUMIN 1.9* 1.9*   No results for input(s): LIPASE, AMYLASE in the last 168 hours. No results for input(s): AMMONIA in the last 168 hours. Coagulation Profile: No results for input(s): INR, PROTIME in the last 168 hours. Cardiac Enzymes: No results for input(s): CKTOTAL, CKMB,  CKMBINDEX, TROPONINI in the last 168 hours. BNP (last 3 results) No results for input(s): PROBNP in the last 8760 hours. HbA1C: No results for input(s): HGBA1C in the last 72 hours. CBG: Recent Labs  Lab 01/28/20 1932 01/28/20 2350 01/29/20 0359 01/29/20 0756 01/29/20 1226  GLUCAP 141* 137* 132* 136* 127*   Lipid Profile: No results for input(s): CHOL, HDL, LDLCALC, TRIG, CHOLHDL, LDLDIRECT in the last 72 hours. Thyroid Function Tests: No results for input(s): TSH, T4TOTAL, FREET4, T3FREE, THYROIDAB in the last 72 hours. Anemia Panel: No results for input(s): VITAMINB12, FOLATE, FERRITIN, TIBC, IRON, RETICCTPCT in the last 72 hours. Urine analysis:    Component Value Date/Time   COLORURINE YELLOW 01/29/2020 1147   APPEARANCEUR HAZY (A) 01/29/2020 1147   LABSPEC 1.012 01/29/2020 1147   PHURINE 7.0 01/29/2020 1147   GLUCOSEU NEGATIVE 01/29/2020 1147   HGBUR LARGE (A) 01/29/2020 1147   BILIRUBINUR NEGATIVE 01/29/2020 1147   BILIRUBINUR negative 09/28/2019 1556   KETONESUR NEGATIVE 01/29/2020 1147   PROTEINUR 100 (A) 01/29/2020 1147   UROBILINOGEN 0.2 09/28/2019 1556   NITRITE NEGATIVE 01/29/2020 1147   LEUKOCYTESUR LARGE (A) 01/29/2020 1147   Sepsis Labs: _1 (procalcitonin:4,lacticidven:4)  ) No results found for this or any previous visit (from the past 240 hour(s)).    Radiology Studies: No results found.   Scheduled Meds: . amLODipine  10 mg Per Tube Daily  . chlorhexidine  15 mL Mouth Rinse BID  . free water  170 mL Per Tube Q4H  . heparin  5,000 Units Subcutaneous Q8H  . insulin aspart  0-15 Units Subcutaneous Q4H  . insulin aspart  4 Units Subcutaneous Q4H  . insulin detemir  12 Units Subcutaneous BID  . lidocaine  1 patch Transdermal Q24H  . mouth rinse  15 mL Mouth Rinse q12n4p  . metoprolol tartrate  100 mg Per Tube BID  . sennosides  5 mL Per Tube BID  . thiamine  100 mg Per Tube Daily   Continuous Infusions: . feeding supplement (GLUCERNA  1.5 CAL) 1,000 mL (01/29/20 0938)     LOS: 16 days   Time Spent in minutes   30 minutes  Deny Chevez  Zubin Pontillo D.O. on 01/29/2020 at 2:19 PM  Between 7am to 7pm - Please see pager noted on [email or web address]  After 7pm go to [email or web address]  And look for the night coverage person covering for me after hours  [facility] Office  [phone]

## 2020-01-30 DIAGNOSIS — E1122 Type 2 diabetes mellitus with diabetic chronic kidney disease: Secondary | ICD-10-CM | POA: Diagnosis not present

## 2020-01-30 DIAGNOSIS — E6609 Other obesity due to excess calories: Secondary | ICD-10-CM | POA: Diagnosis not present

## 2020-01-30 DIAGNOSIS — I10 Essential (primary) hypertension: Secondary | ICD-10-CM | POA: Diagnosis not present

## 2020-01-30 DIAGNOSIS — Z515 Encounter for palliative care: Secondary | ICD-10-CM

## 2020-01-30 DIAGNOSIS — G9340 Encephalopathy, unspecified: Secondary | ICD-10-CM | POA: Diagnosis not present

## 2020-01-30 DIAGNOSIS — R4182 Altered mental status, unspecified: Secondary | ICD-10-CM | POA: Diagnosis not present

## 2020-01-30 DIAGNOSIS — N184 Chronic kidney disease, stage 4 (severe): Secondary | ICD-10-CM

## 2020-01-30 LAB — BASIC METABOLIC PANEL
Anion gap: 7 (ref 5–15)
BUN: 91 mg/dL — ABNORMAL HIGH (ref 8–23)
CO2: 27 mmol/L (ref 22–32)
Calcium: 8.1 mg/dL — ABNORMAL LOW (ref 8.9–10.3)
Chloride: 111 mmol/L (ref 98–111)
Creatinine, Ser: 2.08 mg/dL — ABNORMAL HIGH (ref 0.44–1.00)
GFR calc Af Amer: 27 mL/min — ABNORMAL LOW (ref >60)
GFR calc non Af Amer: 23 mL/min — ABNORMAL LOW (ref >60)
Glucose, Bld: 92 mg/dL (ref 70–99)
Potassium: 5.2 mmol/L — ABNORMAL HIGH (ref 3.5–5.1)
Sodium: 145 mmol/L (ref 135–145)

## 2020-01-30 LAB — GLUCOSE, CAPILLARY
Glucose-Capillary: 94 mg/dL (ref 70–99)
Glucose-Capillary: 92 mg/dL (ref 70–99)
Glucose-Capillary: 109 mg/dL — ABNORMAL HIGH (ref 70–99)
Glucose-Capillary: 179 mg/dL — ABNORMAL HIGH (ref 70–99)
Glucose-Capillary: 127 mg/dL — ABNORMAL HIGH (ref 70–99)
Glucose-Capillary: 98 mg/dL (ref 70–99)
Glucose-Capillary: 80 mg/dL (ref 70–99)

## 2020-01-30 LAB — HEMOGLOBIN AND HEMATOCRIT, BLOOD
HCT: 26.8 % — ABNORMAL LOW (ref 36.0–46.0)
Hemoglobin: 8.1 g/dL — ABNORMAL LOW (ref 12.0–15.0)

## 2020-01-30 LAB — URINE CULTURE

## 2020-01-30 MED ORDER — SODIUM CHLORIDE 0.9 % IV SOLN: INTRAVENOUS | Status: DC

## 2020-01-30 MED ORDER — SODIUM ZIRCONIUM CYCLOSILICATE 10 G PO PACK
10.0000 g | PACK | Freq: Once | ORAL | Status: AC
  Administered 2020-01-30: 10 g via ORAL
  Filled 2020-01-30: qty 1

## 2020-01-30 NOTE — Plan of Care (Signed)
  Problem: Safety: Goal: Ability to remain free from injury will improve Outcome: Progressing   Problem: Pain Managment: Goal: General experience of comfort will improve Outcome: Progressing

## 2020-01-30 NOTE — Progress Notes (Signed)
Physical Therapy Treatment Patient Details Name: Miranda Roman MRN: [id number] DOB: 11-Apr-1946 Today's Date: 01/30/2020    History of Present Illness Pateint is a 74 year old female. 3 weeks prior she injured her foot tubing on vacation. The next day she began to have chest pain and quickly developed acute encephalopathy. She was flown from [place] (on vacation) to [place] where she lives. 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 Patient with no other verbalizations during session despite encouragement to do so. Patient moving all 4 extremities spontaneously, however only followed one command (pushing up to sitting from propping on left elbow). ? If pt would benefit from Kreg bed to allow verticalization into supported standing.     Follow Up Recommendations  SNF     Equipment Recommendations  None recommended by PT    Recommendations for Other Services       Precautions / Restrictions Precautions Precautions: Fall;Other (comment) Precaution Comments: feeding tube Required Braces or Orthoses:  (bilateral resting hand splints to be worn at night)    Mobility  Bed Mobility Overal bed mobility: Needs Assistance Bed Mobility: Sit to Supine;Supine to Sit;Rolling Rolling: Total assist;+2 for physical assistance Sidelying to sit: Total assist;+2 for physical assistance;HOB elevated;+2 for safety/equipment     Sit to sidelying: Total assist;+2 for physical assistance;+2 for safety/equipment General bed mobility comments: pt turned her head appropriately x 1 with cues for rolling;while sidelying, assisted legs off EOB, then raised HOB and then completed side to sit (pt did not assist)  Transfers                 General transfer comment: unable to safely attempt   Ambulation/Gait                 Stairs              Wheelchair Mobility    Modified Rankin (Stroke Patients Only)       Balance Overall balance assessment: Needs assistance Sitting-balance support: No upper extremity supported;Feet supported Sitting balance-Leahy Scale: Poor Sitting balance - Comments: pt able to maintain midline with min to mod assist; if torso is shifted outside her BOS, then she cannot recover to midline and is total assist; worked on trunk rotation and lateral flexion (including pt assisting with pushing herself towards upright from leaning on left elbow                                    Cognition Arousal/Alertness: Awake/alert Behavior During Therapy: Flat affect Overall Cognitive Status: Difficult to assess Area of Impairment: Following commands;Attention;Problem solving                   Current Attention Level: Focused   Following Commands: Follows one step commands inconsistently     Problem Solving: Slow processing;Difficulty sequencing;Decreased initiation;Requires verbal cues;Requires tactile cues General Comments: pushed up from propping on left elbow x 1 (with assist); attempted to repeat and pt did not push with LUE      Exercises General Exercises - Lower Extremity Long Arc Quad: PROM;Both;Seated Other Exercises Other Exercises: cervical PROM rotation R<>L in sitting    General Comments General comments (skin integrity, edema, etc.): Patient able to faintly voice her name at beginning of session. She did not verbalize again. Attempted  to have her rub lotion on her hands, however she did not actively participate      Pertinent Vitals/Pain Pain Assessment: Faces Faces Pain Scale: Hurts whole lot Pain Location: grimacing with touch to palm of rt hand (no injury noted); also grimaces with flexing left hip Pain Descriptors / Indicators: Grimacing;Discomfort Pain Intervention(s): Limited activity within patient's tolerance;Monitored during session;Repositioned     Home Living                      Prior Function            PT Goals (current goals can now be found in the care plan section) Acute Rehab PT Goals Patient Stated Goal: pt unable to state.  Family is hoping that she will improve and be able to engage and be as independent as possible  PT Goal Formulation: With family Time For Goal Achievement: 02/04/20 Progress towards PT goals: Progressing toward goals    Frequency    Min 2X/week      PT Plan Current plan remains appropriate    Co-evaluation              AM-PAC PT "6 Clicks" Mobility   Outcome Measure  Help needed turning from your back to your side while in a flat bed without using bedrails?: Total Help needed moving from lying on your back to sitting on the side of a flat bed without using bedrails?: Total Help needed moving to and from a bed to a chair (including a wheelchair)?: Total Help needed standing up from a chair using your arms (e.g., wheelchair or bedside chair)?: Total Help needed to walk in hospital room?: Total Help needed climbing 3-5 steps with a railing? : Total 6 Click Score: 6    End of Session   Activity Tolerance: Patient limited by fatigue Patient left: in bed;with call bell/phone within reach;with bed alarm set Nurse Communication: Mobility status PT Visit Diagnosis: Other abnormalities of gait and mobility (R26.89);Muscle weakness (generalized) (M62.81);Other symptoms and signs involving the nervous system (R29.898)     Time: 1310-1343 PT Time Calculation (min) (ACUTE ONLY): 33 min  Charges:  $Neuromuscular Re-education: 23-37 mins                      Arby Barrette, PT Pager [phone]    Rexanne Mano 01/30/2020, 3:05 PM

## 2020-01-30 NOTE — Consult Note (Signed)
Consultation Note Date: 01/30/2020   Patient Name: Miranda Roman  DOB: 1945-11-26  MRN: [id number]  Age / Sex: 74 y.o., female  PCP: Glendale Chard, MD Referring Physician: Cristal Ford, DO  Reason for Consultation: Establishing goals of care  HPI/Patient Profile: 74 y.o. female  with past medical history of DM type 2, hypertension, chronic kidney disease stage IV, B thalasemia, and prior bariatric surgery. She was transferred to [facility] from [facility] on 01/13/2020 with encephalopathy of unclear origin. Patient was admitted to [facility] 12/20/19 while traveling with initial presentation of chest pain. Cardiac work-up revealed only moderate pulmonary hypertension. Her hospital course was complicated by sudden onset of AMS, fever, and leukocytosis. ID and neurology were consulted and the patient underwent EEG,  MRI of the brain, and a lumbar puncture, all of which were unrevealing. Her case was further complicated by anemia, thrombocytopenia, and acute kidney injury with consideration of possible TTP. Bone marrow biopsy was done. She was treated with 5 rounds of plasmapheresis and steroids without improvement. At the request of the family, patient was transferred to [facility]. Work-up at [facility] included negative continuous EEG, negative MRI brain, and negative repeat LP. She also had a repeat course of IVIG x 5 days. Palliative care has been consulted to assist with goals of care.   Clinical Assessment and Goals of Care: I have reviewed medical records including EPIC notes, labs and imaging, and examined the patient at bedside. She opens her eyes to voice, appears to track. She is not verbal during my assessment. RN states that patient said "no" earlier when she was administering medications. Nurse tech stated that patient said "hey" this morning.   I spoke with daughter Miranda Roman by phone to discuss diagnosis, prognosis,  GOC, disposition, and options.  I introduced Palliative Medicine as specialized medical care for people living with serious illness. It focuses on providing relief from the symptoms and stress of a serious illness.   We discussed a brief life review of the patient. She grew up in [place], [place]. Married in August 22, 1966 and relocated to [place] with her husband. 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 She worked at [employer] and later at [employer]. She and her husband had 3 children--[name], Miranda Roman, and [place]. Her husband passed away in 2007/08/23 from cancer. She has been retired for about 5 years. She enjoys reading, travel, entertaining, and being active in her church. 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 She expresses that she is a wonderful mother and that family has always been her top priority.    As far as functional status prior to admission, patient was living at home and fully independent.    Briefly discussed the patient's hospital course. Patient had been nonverbal since admission to [facility]. Miranda Roman shares that she was very encouraged because her mother spoke for the first time yesterday  Miranda Roman shares that she and her brothers have been working closely together to make medical decisions. We discussed scheduling a time to meet  to discuss goals of care. [name] lives locally in [place], but is working today and Architectural technologist. John lives in [place], but is coming to visit on Wednesday (7/28). [name] lives in [place], so she will need to coordinate a time that works for him to participate by phone.   Primary decision maker: Adult children Miranda Roman, Miranda Roman and [name]).     SUMMARY OF RECOMMENDATIONS   - Full code, full scope treatment for now - plan for [place] meeting with family 7/28 (time to be determined)  Code Status/Advance Care Planning:  Full code  Palliative Prophylaxis:   Aspiration, Oral Care and Turn Reposition  Additional Recommendations (Limitations,  Scope, Preferences):  Full Scope Treatment  Prognosis:   Unable to determine  Discharge Planning: To Be Determined      Primary Diagnoses: Present on Admission: . Acute encephalopathy . Diabetes mellitus with stage 4 chronic kidney disease ([place]) . PAD (peripheral artery disease) ([place])   I have reviewed the medical record, interviewed the patient and family, and examined the patient. The following aspects are pertinent.  Past Medical History:  Diagnosis Date  . Anemia   . Diabetes mellitus   . Hyperlipidemia   . Hypertension   . Osteoporosis   . Sickle cell anemia (HCC)    Scheduled Meds: . amLODipine  10 mg Per Tube Daily  . chlorhexidine  15 mL Mouth Rinse BID  . free water  170 mL Per Tube Q4H  . heparin  5,000 Units Subcutaneous Q8H  . insulin aspart  0-15 Units Subcutaneous Q4H  . insulin aspart  4 Units Subcutaneous Q4H  . insulin detemir  12 Units Subcutaneous BID  . lidocaine  1 patch Transdermal Q24H  . mouth rinse  15 mL Mouth Rinse q12n4p  . metoprolol tartrate  100 mg Per Tube BID  . sennosides  5 mL Per Tube BID  . sodium zirconium cyclosilicate  10 g Oral Once  . thiamine  100 mg Per Tube Daily   Continuous Infusions: . feeding supplement (GLUCERNA 1.5 CAL) 55 mL/hr at 01/30/20 0900   PRN Meds:.acetaminophen **OR** [DISCONTINUED] acetaminophen, docusate, labetalol, polyethylene glycol, senna-docusate Medications Prior to Admission:  Prior to Admission medications   Medication Sig Start Date End Date Taking? Authorizing Provider  insulin lispro (HUMALOG) 100 UNIT/ML injection Inject 0.04 mLs (4 Units total) into the skin 3 (three) times daily before meals. Sliding scale Patient taking differently: Inject 0-16 Units into the skin See admin instructions. Inject 0-16 units subcutaneously every 4 hours per sliding scale: CBG 60-110 0 units, 111-150 4 units, 151-200 8 units, 201-250 10 units, 251-300 12 units, 301-350 14 units, >350 16 units 01/26/19  Yes  Glendale Chard, MD  LEVEMIR FLEXTOUCH 100 UNIT/ML Pen INJECT SUBCUTANEOUSLY 16  UNITS DAILY Patient taking differently: Inject 5 Units into the skin 2 (two) times daily.  06/23/19  Yes Glendale Chard, MD  levETIRAcetam (KEPPRA) 500 MG/5ML injection Inject 500 mg into the vein every 12 (twelve) hours.   Yes [provider]  metoprolol tartrate (LOPRESSOR) 50 MG tablet Take 50 mg by mouth 2 (two) times daily. 01/03/20  Yes [provider]  DEXILANT 60 MG capsule TAKE 1 CAPSULE BY MOUTH  DAILY Patient not taking: Reported on 01/13/2020 08/13/19   Glendale Chard, MD  glucose blood [facility] VERIO) test strip Use as instructed to check blood sugars 1 time per day e11.22 09/23/18   Glendale Chard, MD  loratadine (CLARITIN) 10 MG tablet Take 1 tablet (10 mg total) by mouth  daily. 10/28/18 10/28/19  Glendale Chard, MD  [facility] DELICA LANCETS 15N MISC 1 each by Does not apply route daily. DX: E11.65 04/22/18   Glendale Chard, MD   Allergies  Allergen Reactions  . Cefepime Other (See Comments)    Causes encephalitis - reported by [facility] 12/31/2019   Review of Systems  Unable to perform ROS: Other    Physical Exam Vitals reviewed.  Constitutional:      General: She is not in acute distress. Pulmonary:     Effort: Pulmonary effort is normal.  Abdominal:     Comments: Tube feeding via cortrak  Skin:    General: Skin is warm and dry.  Neurological:     Mental Status: She is lethargic.     Comments: Opens eyes to voice  Does not follow commands     Vital Signs: BP (!) 144/76   Pulse 101   Temp 99.2 F (37.3 C) (Oral)   Resp 18   Wt 78.2 kg   SpO2 100%   BMI 28.87 kg/m  Pain Scale: Faces POSS *See Group Information*: 1-Acceptable,Awake and alert Pain Score: 0-No pain   SpO2: SpO2: 100 % O2 Device:SpO2: 100 % O2 Flow Rate: .   IO: Intake/output summary:   Intake/Output Summary (Last 24 hours) at 01/30/2020 1050 Last data filed at 01/30/2020 1000 Gross per 24  hour  Intake 1255.67 ml  Output 1100 ml  Net 155.67 ml    LBM: Last BM Date: 01/29/20 Baseline Weight: Weight: 78.3 kg Most recent weight: Weight: 78.2 kg      Palliative Assessment/Data: 10-20%    Time In: 10:30 Time Out: 11:00 Time Total: 30 minutes Greater than 50%  of this time was spent counseling and coordinating care related to the above assessment and plan.  Signed by: Lavena Bullion, NP   Please contact Palliative Medicine Team phone at [phone] for questions and concerns.  For individual provider: See Shea Evans

## 2020-01-30 NOTE — Progress Notes (Addendum)
PROGRESS NOTE    Miranda Roman  BHA:[id number] DOB: 08/02/1945 DOA: 01/13/2020 PCP: Glendale Chard, MD   Brief Narrative:  74 year old with a history of DM2, CKD stage IV, [place] trait, B thalasemia, and prior bariatric surgery who was transferred to [facility] from [facility] where she originally presented 12/21/2019 with complaints of chest pain.  A VQ scan and Dopplers were negative at that facility, and a cardiac work-up revealed only moderate pulmonary hypertension.  Her hospital course was complicated by the sudden onset of encephalopathy leukocytosis and fever.  ID and Neurology were consulted and the patient underwent MRI of the brain and a lumbar puncture, both of which were unrevealing.  EEG was unrevealing.  Her case was further complicated by anemia, thrombocytopenia, and acute kidney injury with consideration being given to possible TTP.  She was treated with 5 rounds of plasmapheresis and steroids without improvement.  Adam TS 13 was normal.  Bone marrow biopsy was performed.  At request of the family she was transferred to [facility].  Work-up after arrival to [facility] has included a negative long-term EEG, negative MRI brain, and negative repeat LP.  She had a repeat course of IVIG x5 days. 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 -MRI brain 7/11 negative -Patient was on Keppra 500 mg IV twice daily.  Discussed with son and he states that he feels that she may be more sleepy due to this.  Discussed with neurology on 01/25/2020, Dr. Lorraine Lax, recommended discontinuing and not tapering Keppra and monitoring closely for seizures.  No need to start stimulants at this time.  -Repeat LP 7/12 negative.  -Patient has completed 5 days of IVIG, which was started on 01/16/2020  -Neurology consulted and appreciated, has no further recommendations at this time.  We will continue to follow her the next several days to see if there is any improvement from the completed course of IVIG.  However effects may take 2 to 4 weeks. -Per neurology, Dr. Arrie Eastern note from 7/22, feels that Warnicke's could be higher on the differential given her acute decline following hospitalization.  Recommend to continue supportive care and follow-up on CSF autoimmune encephalitis panel/paraneoplastic panel.  Palliative care consult. -Patient's mental status is improving. She was to speak and follow commands on 01/29/20.   Loose stools -Secondary to tube feeding with no evidence of C. difficile clinically  Hyponatremia due to mild dehydration -Resolved, continue to monitor BMP  Diabetes mellitus, type II -Patient normally uses an insulin pump at baseline -Hemoglobin A1c 7.1 (on 01/14/2020) -Continue Levemir, insulin sliding scale and CBG monitoring   Vitamin D deficiency -Vitamin D, 25-hydroxy currently 43.42 -within normal limits   Anemia of chronic disease/ Sickle cell trait / Beta thalassemia -Hemoglobin currently 8.1 -Patient did receive 1 unit PRBC on 01/17/2020 -Continue to monitor CBC  Chronic kidney disease, stage IV -Baseline creatinine approximately 2.4, currently creatinine 2.08 -Continue to monitor BMP  Hyperkalemia -Potassium currently 5.2 -Will give lokelma  -Continue BMP  Essential hypertension -Continue amlodipine, metoprolol -BP stable  Mild to moderate protein calorie malnutrition with dysphagia -Not a candidate for PEG tube given history of gastric bypass  -will have to consider J-tube per general surgery if  long-term tube feeds requirement -s/p Cortrak placement -Currently on glucerna  L1-L5 vertebral  body/sacral sclerosis -Noted per Hematology at [facility] -felt to be due to sickle cell trait  Asymptomatic bacteriuria -UA shows rare bacteria, large leukocytes, >50 WBC -Family concerned given that the urine was dark in color -Urine culture pending -will start patient on gentle IVF   DVT Prophylaxis  heparin  Code Status: Full  Family Communication: None at bedside. Daughter via phone.  Disposition Plan:  Status is: Inpatient  Remains inpatient appropriate because:Altered mental status, IV treatments appropriate due to intensity of illness or inability to take PO and Inpatient level of care appropriate due to severity of illness   Dispo: The patient is from: Home              Anticipated d/c is to: TBD              Anticipated d/c date is: > 3 days              Patient currently is not medically stable to d/c.   Consultants Neurology   Procedures  EEG Lumbar puncture Cortrak  Antibiotics   Anti-infectives (From admission, onward)   None      Subjective:   Miranda Roman seen and examined today.   Not speaking with me this morning but was able to move arms to commands.  Objective:   Vitals:   01/29/20 2320 01/30/20 0313 01/30/20 0408 01/30/20 0831  BP: 106/77 112/67  (!) 144/76  Pulse: 91 94  101  Resp: 18 18    Temp: 99.2 F (37.3 C) 99.2 F (37.3 C)    TempSrc: Oral Oral    SpO2: 100% 100%  100%  Weight:   78.2 kg     Intake/Output Summary (Last 24 hours) at 01/30/2020 1118 Last data filed at 01/30/2020 1056 Gross per 24 hour  Intake 1255.67 ml  Output 1550 ml  Net -294.33 ml   Filed Weights   01/28/20 0316 01/29/20 0342 01/30/20 0408  Weight: 78.7 kg 78.9 kg 78.2 kg    Exam  General: Well developed, well nourished, NAD  HEENT: NCAT, mucous membranes moist.   Cardiovascular: S1 S2 auscultated, RRR  Respiratory: Clear to auscultation bilaterally  Abdomen: Soft, nontender, nondistended, + bowel sounds  Extremities: warm dry without  cyanosis clubbing or edema  Neuro: awake and alert, not speaking. Follows some commands.   Data Reviewed: I have personally reviewed following labs and imaging studies  CBC: Recent Labs  Lab 01/24/20 0407 01/24/20 0407 01/25/20 0447 01/26/20 0521 01/27/20 1247 01/29/20 0424 01/30/20 0320  WBC 6.3  --  5.6  --   --   --   --   HGB 8.5*   < > 8.3* 8.5* 9.2* 8.2* 8.1*  HCT 27.3*   < > 25.8* 27.0* 29.4* 27.2* 26.8*  MCV 84.3  --  84.3  --   --   --   --   PLT 216  --  192  --   --   --   --    < > = values in this interval not displayed.   Basic Metabolic Panel: Recent Labs  Lab 01/24/20 0407 01/25/20 0447 01/26/20 0521 01/27/20 1247 01/28/20 1215 01/29/20 0424 01/30/20 0320  NA 136   < > 139 139 140 144 145  K 5.7*   < > 5.9* 5.4* 5.6* 5.1 5.2*  CL 111   < > 110 109 108 110 111  CO2 19*   < >  21* _0 GLUCOSE 257*   < > 181* 156* 180* 142* 92  BUN 79*   < > 86* 96* 94* 91* 91*  CREATININE 2.23*   < > 2.07* 2.05* 2.02* 1.95* 2.08*  CALCIUM 8.4*   < > 8.3* 8.5* 8.1* 8.1* 8.1*  MG 2.7*  --   --   --   --   --   --   PHOS 5.1*  --   --   --   --   --   --    < > = values in this interval not displayed.   GFR: Estimated Creatinine Clearance: 25.2 mL/min (A) (by C-G formula based on SCr of 2.08 mg/dL (H)). Liver Function Tests: Recent Labs  Lab 01/24/20 0407 01/25/20 0447  AST 25 27  ALT 34 35  ALKPHOS 50 53  BILITOT 0.7 0.7  PROT 7.4 7.1  ALBUMIN 1.9* 1.9*   No results for input(s): LIPASE, AMYLASE in the last 168 hours. No results for input(s): AMMONIA in the last 168 hours. Coagulation Profile: No results for input(s): INR, PROTIME in the last 168 hours. Cardiac Enzymes: No results for input(s): CKTOTAL, CKMB, CKMBINDEX, TROPONINI in the last 168 hours. BNP (last 3 results) No results for input(s): PROBNP in the last 8760 hours. HbA1C: No results for input(s): HGBA1C in the last 72 hours. CBG: Recent Labs  Lab 01/29/20 1707 01/29/20 1929  01/29/20 2319 01/30/20 0313 01/30/20 0829  GLUCAP 143* 131* 94 92 109*   Lipid Profile: No results for input(s): CHOL, HDL, LDLCALC, TRIG, CHOLHDL, LDLDIRECT in the last 72 hours. Thyroid Function Tests: No results for input(s): TSH, T4TOTAL, FREET4, T3FREE, THYROIDAB in the last 72 hours. Anemia Panel: No results for input(s): VITAMINB12, FOLATE, FERRITIN, TIBC, IRON, RETICCTPCT in the last 72 hours. Urine analysis:    Component Value Date/Time   COLORURINE YELLOW 01/29/2020 1147   APPEARANCEUR HAZY (A) 01/29/2020 1147   LABSPEC 1.012 01/29/2020 1147   PHURINE 7.0 01/29/2020 1147   GLUCOSEU NEGATIVE 01/29/2020 1147   HGBUR LARGE (A) 01/29/2020 1147   BILIRUBINUR NEGATIVE 01/29/2020 1147   BILIRUBINUR negative 09/28/2019 1556   KETONESUR NEGATIVE 01/29/2020 1147   PROTEINUR 100 (A) 01/29/2020 1147   UROBILINOGEN 0.2 09/28/2019 1556   NITRITE NEGATIVE 01/29/2020 1147   LEUKOCYTESUR LARGE (A) 01/29/2020 1147   Sepsis Labs: _1 (procalcitonin:4,lacticidven:4)  ) Recent Results (from the past 240 hour(s))  Culture, Urine     Status: Abnormal   Collection Time: 01/29/20  1:31 PM   Specimen: Urine, Clean Catch  Result Value Ref Range Status   Specimen Description URINE, CLEAN CATCH  Final   Special Requests   Final    NONE Performed at [facility], [place] [address] [place], [place] [address]    Culture MULTIPLE SPECIES PRESENT, SUGGEST RECOLLECTION (A)  Final   Report Status 01/30/2020 FINAL  Final      Radiology Studies: No results found.   Scheduled Meds: . amLODipine  10 mg Per Tube Daily  . chlorhexidine  15 mL Mouth Rinse BID  . free water  170 mL Per Tube Q4H  . heparin  5,000 Units Subcutaneous Q8H  . insulin aspart  0-15 Units Subcutaneous Q4H  . insulin aspart  4 Units Subcutaneous Q4H  . insulin detemir  12 Units Subcutaneous BID  . lidocaine  1 patch Transdermal Q24H  . mouth rinse  15 mL Mouth Rinse q12n4p  . metoprolol tartrate  100 mg  Per Tube BID  . sennosides  5 mL Per Tube BID  . thiamine  100 mg Per Tube Daily   Continuous Infusions: . feeding supplement (GLUCERNA 1.5 CAL) 55 mL/hr at 01/30/20 0900     LOS: 17 days   Time Spent in minutes   30 minutes  Hamsa Laurich D.O. on 01/30/2020 at 11:18 AM  Between 7am to 7pm - Please see pager noted on [email or web address]  After 7pm go to [email or web address]  And look for the night coverage person covering for me after hours  [facility] Office  [phone]

## 2020-01-31 DIAGNOSIS — I10 Essential (primary) hypertension: Secondary | ICD-10-CM | POA: Diagnosis not present

## 2020-01-31 DIAGNOSIS — E6609 Other obesity due to excess calories: Secondary | ICD-10-CM | POA: Diagnosis not present

## 2020-01-31 DIAGNOSIS — E1122 Type 2 diabetes mellitus with diabetic chronic kidney disease: Secondary | ICD-10-CM | POA: Diagnosis not present

## 2020-01-31 DIAGNOSIS — G9340 Encephalopathy, unspecified: Secondary | ICD-10-CM | POA: Diagnosis not present

## 2020-01-31 LAB — GLUCOSE, CAPILLARY
Glucose-Capillary: 130 mg/dL — ABNORMAL HIGH (ref 70–99)
Glucose-Capillary: 113 mg/dL — ABNORMAL HIGH (ref 70–99)
Glucose-Capillary: 155 mg/dL — ABNORMAL HIGH (ref 70–99)
Glucose-Capillary: 187 mg/dL — ABNORMAL HIGH (ref 70–99)
Glucose-Capillary: 199 mg/dL — ABNORMAL HIGH (ref 70–99)
Glucose-Capillary: 146 mg/dL — ABNORMAL HIGH (ref 70–99)

## 2020-01-31 LAB — BASIC METABOLIC PANEL
Anion gap: 8 (ref 5–15)
BUN: 86 mg/dL — ABNORMAL HIGH (ref 8–23)
CO2: 27 mmol/L (ref 22–32)
Calcium: 7.8 mg/dL — ABNORMAL LOW (ref 8.9–10.3)
Chloride: 111 mmol/L (ref 98–111)
Creatinine, Ser: 2 mg/dL — ABNORMAL HIGH (ref 0.44–1.00)
GFR calc Af Amer: 28 mL/min — ABNORMAL LOW (ref >60)
GFR calc non Af Amer: 24 mL/min — ABNORMAL LOW (ref >60)
Glucose, Bld: 121 mg/dL — ABNORMAL HIGH (ref 70–99)
Potassium: 5 mmol/L (ref 3.5–5.1)
Sodium: 146 mmol/L — ABNORMAL HIGH (ref 135–145)

## 2020-01-31 LAB — HEMOGLOBIN AND HEMATOCRIT, BLOOD
HCT: 26.7 % — ABNORMAL LOW (ref 36.0–46.0)
Hemoglobin: 7.8 g/dL — ABNORMAL LOW (ref 12.0–15.0)

## 2020-01-31 MED ORDER — SODIUM CHLORIDE 0.45 % IV SOLN: INTRAVENOUS | Status: DC

## 2020-01-31 NOTE — Progress Notes (Signed)
PROGRESS NOTE    Miranda Roman  MMH:[id number] DOB: 28-Mar-1946 DOA: 01/13/2020 PCP: Glendale Chard, MD   Brief Narrative:  74 year old with a history of DM2, CKD stage IV, [place] trait, B thalasemia, and prior bariatric surgery who was transferred to [facility] from [facility] where she originally presented 12/21/2019 with complaints of chest pain.  A VQ scan and Dopplers were negative at that facility, and a cardiac work-up revealed only moderate pulmonary hypertension.  Her hospital course was complicated by the sudden onset of encephalopathy leukocytosis and fever.  ID and Neurology were consulted and the patient underwent MRI of the brain and a lumbar puncture, both of which were unrevealing.  EEG was unrevealing.  Her case was further complicated by anemia, thrombocytopenia, and acute kidney injury with consideration being given to possible TTP.  She was treated with 5 rounds of plasmapheresis and steroids without improvement.  Adam TS 13 was normal.  Bone marrow biopsy was performed.  At request of the family she was transferred to [facility].  Work-up after arrival to [facility] has included a negative long-term EEG, negative MRI brain, and negative repeat LP.  She had a repeat course of IVIG x5 days. 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 -MRI brain 7/11 negative -Patient was on Keppra 500 mg IV twice daily.  Discussed with son and he states that he feels that she may be more sleepy due to this.  Discussed with neurology on 01/25/2020, Dr. Lorraine Lax, recommended discontinuing and not tapering Keppra and monitoring closely for seizures.  No need to start stimulants at this time.  -Repeat LP 7/12 negative.  -Patient has completed 5 days of IVIG, which was started on 01/16/2020  -Neurology consulted and appreciated, has no further recommendations at this time.  We will continue to follow her the next several days to see if there is any improvement from the completed course of IVIG.  However effects may take 2 to 4 weeks. -Per neurology, Dr. Arrie Eastern note from 7/22, feels that Warnicke's could be higher on the differential given her acute decline following hospitalization.  Recommend to continue supportive care and follow-up on CSF autoimmune encephalitis panel/paraneoplastic panel.  Palliative care consult. -Patient was able to speak and follow commands on 01/29/2020, however this morning she is not as interactive.  Loose stools -Secondary to tube feeding with no evidence of C. difficile clinically  Hyponatremia due to mild dehydration -Resolved, continue to monitor BMP  Hypernatremia -Mild, sodium 146 -Will switchIV fluids to half-normal saline and continue to monitor BMP  Diabetes mellitus, type II -Patient normally uses an insulin pump at baseline -Hemoglobin A1c 7.1 (on 01/14/2020) -Continue Levemir, insulin sliding scale and CBG monitoring   Vitamin D deficiency -Vitamin D, 25-hydroxy currently 43.42 -within normal limits   Anemia of chronic disease/ Sickle cell trait / Beta thalassemia -Hemoglobin currently 7.8 (small drop in the past few days, suspect dilutional given IVF) -Patient did receive 1 unit PRBC on 01/17/2020 -Continue to monitor CBC  Chronic kidney disease, stage IV -Baseline creatinine approximately 2.4, currently creatinine 2.00 -Continue to monitor BMP  Hyperkalemia -resolved with lokelma -Continue to monitor BMP  Essential hypertension -Continue amlodipine, metoprolol -BP stable  Mild  to moderate protein calorie malnutrition with dysphagia -Not a candidate for PEG tube given history  of gastric bypass  -will have to consider J-tube per general surgery if long-term tube feeds requirement -s/p Cortrak placement -Currently on glucerna  L1-L5 vertebral body/sacral sclerosis -Noted per Hematology at [facility] -felt to be due to sickle cell trait  Asymptomatic bacteriuria -UA shows rare bacteria, large leukocytes, >50 WBC -Family concerned given that the urine was dark in color -Urine culture multiple species- suggest recollection -Continue IVF   Goals of care -Palliative care consulted and appreciated, planning on meeting on 02/01/20  DVT Prophylaxis  Heparin- will hold given drop in hemoglobin and place on SCDs  Code Status: Full  Family Communication: None at bedside. Daughter and son via phone.  Disposition Plan:  Status is: Inpatient  Remains inpatient appropriate because:Altered mental status, IV treatments appropriate due to intensity of illness or inability to take PO and Inpatient level of care appropriate due to severity of illness   Dispo: The patient is from: Home              Anticipated d/c is to: TBD              Anticipated d/c date is: > 3 days              Patient currently is not medically stable to d/c.   Consultants Neurology   Procedures  EEG Lumbar puncture Cortrak  Antibiotics   Anti-infectives (From admission, onward)   None      Subjective:   Miranda Roman seen and examined today.   Not speaking with me this morning or following commands.  Objective:   Vitals:   01/30/20 1927 01/30/20 2326 01/31/20 0323 01/31/20 0719  BP: (!) 126/58 (!) 110/57 109/72 (!) 151/69  Pulse: 101 104 95 72  Resp: _0 Temp: 98.4 F (36.9 C) 97.6 F (36.4 C) 99.2 F (37.3 C) 98.9 F (37.2 C)  TempSrc: Oral Axillary Oral Oral  SpO2: 99% 100% 100% 100%  Weight:        Intake/Output Summary (Last 24 hours) at 01/31/2020 1027 Last data filed at 01/31/2020 0754 Gross per 24 hour  Intake 2437.02 ml  Output 2170 ml  Net  267.02 ml   Filed Weights   01/28/20 0316 01/29/20 0342 01/30/20 0408  Weight: 78.7 kg 78.9 kg 78.2 kg   Exam  General: Well developed, well nourished, NAD  HEENT: NCAT, mucous membranes moist.   Cardiovascular: S1 S2 auscultated, RRR.  Respiratory: Clear to auscultation bilaterally   Abdomen: Soft, nontender, nondistended, + bowel sounds  Extremities: warm dry without cyanosis clubbing or edema  Neuro: Awake and alert, not speaking.   Data Reviewed: I have personally reviewed following labs and imaging studies  CBC: Recent Labs  Lab 01/25/20 0447 01/25/20 0447 01/26/20 0521 01/27/20 1247 01/29/20 0424 01/30/20 0320 01/31/20 0314  WBC 5.6  --   --   --   --   --   --   HGB 8.3*   < > 8.5* 9.2* 8.2* 8.1* 7.8*  HCT 25.8*   < > 27.0* 29.4* 27.2* 26.8* 26.7*  MCV 84.3  --   --   --   --   --   --   PLT 192  --   --   --   --   --   --    < > = values in this interval not displayed.   Basic Metabolic  Panel: Recent Labs  Lab 01/27/20 1247 01/28/20 1215 01/29/20 0424 01/30/20 0320 01/31/20 0314  NA 139 140 144 145 146*  K 5.4* 5.6* 5.1 5.2* 5.0  CL 109 108 110 111 111  CO2 _0 GLUCOSE 156* 180* 142* 92 121*  BUN 96* 94* 91* 91* 86*  CREATININE 2.05* 2.02* 1.95* 2.08* 2.00*  CALCIUM 8.5* 8.1* 8.1* 8.1* 7.8*   GFR: Estimated Creatinine Clearance: 26.2 mL/min (A) (by C-G formula based on SCr of 2 mg/dL (H)). Liver Function Tests: Recent Labs  Lab 01/25/20 0447  AST 27  ALT 35  ALKPHOS 53  BILITOT 0.7  PROT 7.1  ALBUMIN 1.9*   No results for input(s): LIPASE, AMYLASE in the last 168 hours. No results for input(s): AMMONIA in the last 168 hours. Coagulation Profile: No results for input(s): INR, PROTIME in the last 168 hours. Cardiac Enzymes: No results for input(s): CKTOTAL, CKMB, CKMBINDEX, TROPONINI in the last 168 hours. BNP (last 3 results) No results for input(s): PROBNP in the last 8760 hours. HbA1C: No results for input(s):  HGBA1C in the last 72 hours. CBG: Recent Labs  Lab 01/30/20 1655 01/30/20 1925 01/30/20 2326 01/31/20 0322 01/31/20 0724  GLUCAP 127* 98 80 130* 113*   Lipid Profile: No results for input(s): CHOL, HDL, LDLCALC, TRIG, CHOLHDL, LDLDIRECT in the last 72 hours. Thyroid Function Tests: No results for input(s): TSH, T4TOTAL, FREET4, T3FREE, THYROIDAB in the last 72 hours. Anemia Panel: No results for input(s): VITAMINB12, FOLATE, FERRITIN, TIBC, IRON, RETICCTPCT in the last 72 hours. Urine analysis:    Component Value Date/Time   COLORURINE YELLOW 01/29/2020 1147   APPEARANCEUR HAZY (A) 01/29/2020 1147   LABSPEC 1.012 01/29/2020 1147   PHURINE 7.0 01/29/2020 1147   GLUCOSEU NEGATIVE 01/29/2020 1147   HGBUR LARGE (A) 01/29/2020 1147   BILIRUBINUR NEGATIVE 01/29/2020 1147   BILIRUBINUR negative 09/28/2019 1556   KETONESUR NEGATIVE 01/29/2020 1147   PROTEINUR 100 (A) 01/29/2020 1147   UROBILINOGEN 0.2 09/28/2019 1556   NITRITE NEGATIVE 01/29/2020 1147   LEUKOCYTESUR LARGE (A) 01/29/2020 1147   Sepsis Labs: _1 (procalcitonin:4,lacticidven:4)  ) Recent Results (from the past 240 hour(s))  Culture, Urine     Status: Abnormal   Collection Time: 01/29/20  1:31 PM   Specimen: Urine, Clean Catch  Result Value Ref Range Status   Specimen Description URINE, CLEAN CATCH  Final   Special Requests   Final    NONE Performed at [facility], [place] [address] [place], [place] [address]    Culture MULTIPLE SPECIES PRESENT, SUGGEST RECOLLECTION (A)  Final   Report Status 01/30/2020 FINAL  Final      Radiology Studies: No results found.   Scheduled Meds: . amLODipine  10 mg Per Tube Daily  . chlorhexidine  15 mL Mouth Rinse BID  . free water  170 mL Per Tube Q4H  . heparin  5,000 Units Subcutaneous Q8H  . insulin aspart  0-15 Units Subcutaneous Q4H  . insulin aspart  4 Units Subcutaneous Q4H  . insulin detemir  12 Units Subcutaneous BID  . lidocaine  1 patch  Transdermal Q24H  . mouth rinse  15 mL Mouth Rinse q12n4p  . metoprolol tartrate  100 mg Per Tube BID  . sennosides  5 mL Per Tube BID  . thiamine  100 mg Per Tube Daily   Continuous Infusions: . sodium chloride 50 mL/hr at 01/31/20 0739  . feeding supplement (GLUCERNA 1.5 CAL) 1,000 mL (01/31/20  5366)     LOS: 18 days   Time Spent in minutes   30 minutes  Miranda Roman D.O. on 01/31/2020 at 10:27 AM  Between 7am to 7pm - Please see pager noted on [email or web address]  After 7pm go to [email or web address]  And look for the night coverage person covering for me after hours  [facility] Office  [phone]

## 2020-01-31 NOTE — Progress Notes (Signed)
Patient does answer "yes" and "no" questions appropriately and turns head towards sound.  Continues to make uncontrolled movements with upper extremities and torso.  Stuck her tongue out for po care for me, indicating comprehension of instructions.  Family to be in later to see.

## 2020-01-31 NOTE — Progress Notes (Signed)
Daughter, Truman Hayward, in to vs. With patient.  She stated she was to have a conference with a nurse practitioner tomorrow and stated to me that the best time to meet would be tomorrow at 1230.  She was not certain of the name of the practitioner.

## 2020-01-31 NOTE — Progress Notes (Signed)
Occupational Therapy Treatment Patient Details Name: Miranda Roman MRN: [id number] DOB: 1946-05-20 Today's Date: 01/31/2020    History of present illness Patient is a 74 year old female. 3 weeks prior she injured her foot tubing on vacation. The next day she began to have chest pain and quickly developed acute encephalopathy. She was flown from [place] (on vacation) to [place] where she lives. Workups negative with neuro looking at multifactorial encephalopathy-likely autoimmune   OT comments  Upon arrival, pt awake and up in bed. Pt performed PROM of BUEs with total A from therapist. Noted increased pain and tone in R wrist and fingers with flexion and extension. RN unsure if pt has been able to tolerate resting hands splints. Pt unable to follow max multimodal commands to assist in bed level grooming activities. When asked if it felt good to wash her face, she faintly verbalized "yes" but did not verbalize the rest of the session. Needing Total A +2 for rolling in the bed for repositioning and pillow placement. Believe dc recommendations are still appropriate. Will follow acutely.    Follow Up Recommendations  LTACH    Equipment Recommendations  None recommended by OT       Precautions / Restrictions Precautions Precautions: Fall;Other (comment) Precaution Comments: feeding tube       Mobility Bed Mobility Overal bed mobility: Needs Assistance Bed Mobility: Rolling Rolling: Total assist;+2 for physical assistance         General bed mobility comments: total A +2 for rolling to reposition and adjust pillows - unable to follow commands to assess  Transfers                 General transfer comment: unable to safely attempt         ADL either performed or assessed with clinical judgement   ADL       Grooming: Wash/dry face;Total assistance;Bed level Grooming Details (indicate cue type and reason): total A to wash face from supine with max multimodal cues  and HOH assist                             Functional mobility during ADLs: Total assistance;+2 for physical assistance General ADL Comments: session focused on BUE PROM and bed level grooming with command following               Cognition Arousal/Alertness: Awake/alert Behavior During Therapy: Flat affect Overall Cognitive Status: Difficult to assess Area of Impairment: Following commands;Attention;Problem solving                   Current Attention Level: Focused   Following Commands: Follows one step commands inconsistently     Problem Solving: Slow processing;Difficulty sequencing;Decreased initiation;Requires verbal cues;Requires tactile cues General Comments: pt inconsistent with following one step commands with bed level grooming        Exercises Exercises: Other exercises General Exercises - Upper Extremity Shoulder Flexion: PROM;10 reps;Supine;Both Shoulder Extension: Both;PROM;10 reps;Supine Elbow Flexion: PROM;Both;Supine;10 reps Elbow Extension: PROM;Both;Supine;10 reps Wrist Flexion: PROM;Both;Supine;10 reps Wrist Extension: PROM;Both;Supine;10 reps Digit Composite Flexion: PROM;Both;Supine;10 reps Composite Extension: PROM;Both;Supine;10 reps Hand Exercises Forearm Supination: PROM;Both;Supine;10 reps Forearm Pronation: PROM;Both;Supine;10 reps      General Comments Pt able to faintly respond "yes" when asked if washing her face felt good but did not verbalize again.    Pertinent Vitals/ Pain       Pain Assessment: Faces Faces Pain Scale: Hurts even more Pain Location:  grimacing with touch and PROM of R wrist and hand Pain Descriptors / Indicators: Grimacing;Discomfort Pain Intervention(s): Limited activity within patient's tolerance;Repositioned         Frequency  Min 2X/week        Progress Toward Goals  OT Goals(current goals can now be found in the care plan section)  Progress towards OT goals: Not progressing  toward goals - comment (no following commands)  Acute Rehab OT Goals Patient Stated Goal: pt unable to state.  Family is hoping that she will improve and be able to engage and be as independent as possible  OT Goal Formulation: With patient Time For Goal Achievement: 02/03/20 Potential to Achieve Goals: [place] Discharge plan remains appropriate;Frequency remains appropriate       AM-PAC OT "6 Clicks" Daily Activity     Outcome Measure   Help from another person eating meals?: Total Help from another person taking care of personal grooming?: Total Help from another person toileting, which includes using toliet, bedpan, or urinal?: Total Help from another person bathing (including washing, rinsing, drying)?: Total Help from another person to put on and taking off regular upper body clothing?: Total Help from another person to put on and taking off regular lower body clothing?: Total 6 Click Score: 6    End of Session    OT Visit Diagnosis: Cognitive communication deficit (R41.841)   Activity Tolerance Patient tolerated treatment well   Patient Left in bed;with call bell/phone within reach;with bed alarm set;with family/visitor present   Nurse Communication Mobility status;Other (comment) (splint schedule tolerance)        Time: 1326-1350 OT Time Calculation (min): 24 min  Charges: OT General Charges $OT Visit: 1 Visit OT Treatments $Self Care/Home Management : 8-22 mins $Therapeutic Exercise: 8-22 mins  Darl Kuss/OTS   Drew Herman 01/31/2020, 3:07 PM

## 2020-02-01 ENCOUNTER — Ambulatory Visit: Payer: Medicare Other | Admitting: Podiatry

## 2020-02-01 DIAGNOSIS — Z7189 Other specified counseling: Secondary | ICD-10-CM

## 2020-02-01 DIAGNOSIS — Z8739 Personal history of other diseases of the musculoskeletal system and connective tissue: Secondary | ICD-10-CM | POA: Diagnosis not present

## 2020-02-01 DIAGNOSIS — Z9884 Bariatric surgery status: Secondary | ICD-10-CM | POA: Diagnosis not present

## 2020-02-01 DIAGNOSIS — G9340 Encephalopathy, unspecified: Secondary | ICD-10-CM | POA: Diagnosis not present

## 2020-02-01 DIAGNOSIS — R638 Other symptoms and signs concerning food and fluid intake: Secondary | ICD-10-CM

## 2020-02-01 DIAGNOSIS — E1122 Type 2 diabetes mellitus with diabetic chronic kidney disease: Secondary | ICD-10-CM | POA: Diagnosis not present

## 2020-02-01 LAB — CBC
HCT: 27.4 % — ABNORMAL LOW (ref 36.0–46.0)
Hemoglobin: 8.1 g/dL — ABNORMAL LOW (ref 12.0–15.0)
MCH: 26 pg (ref 26.0–34.0)
MCHC: 29.6 g/dL — ABNORMAL LOW (ref 30.0–36.0)
MCV: 88.1 fL (ref 80.0–100.0)
Platelets: 312 10*3/uL (ref 150–400)
RBC: 3.11 MIL/uL — ABNORMAL LOW (ref 3.87–5.11)
RDW: 18.3 % — ABNORMAL HIGH (ref 11.5–15.5)
WBC: 7.9 10*3/uL (ref 4.0–10.5)
nRBC: 0.5 % — ABNORMAL HIGH (ref 0.0–0.2)

## 2020-02-01 LAB — GLUCOSE, CAPILLARY
Glucose-Capillary: 111 mg/dL — ABNORMAL HIGH (ref 70–99)
Glucose-Capillary: 146 mg/dL — ABNORMAL HIGH (ref 70–99)
Glucose-Capillary: 146 mg/dL — ABNORMAL HIGH (ref 70–99)
Glucose-Capillary: 135 mg/dL — ABNORMAL HIGH (ref 70–99)
Glucose-Capillary: 144 mg/dL — ABNORMAL HIGH (ref 70–99)
Glucose-Capillary: 100 mg/dL — ABNORMAL HIGH (ref 70–99)

## 2020-02-01 LAB — BASIC METABOLIC PANEL
Anion gap: 9 (ref 5–15)
BUN: 76 mg/dL — ABNORMAL HIGH (ref 8–23)
CO2: 25 mmol/L (ref 22–32)
Calcium: 8 mg/dL — ABNORMAL LOW (ref 8.9–10.3)
Chloride: 113 mmol/L — ABNORMAL HIGH (ref 98–111)
Creatinine, Ser: 1.84 mg/dL — ABNORMAL HIGH (ref 0.44–1.00)
GFR calc Af Amer: 31 mL/min — ABNORMAL LOW (ref >60)
GFR calc non Af Amer: 27 mL/min — ABNORMAL LOW (ref >60)
Glucose, Bld: 117 mg/dL — ABNORMAL HIGH (ref 70–99)
Potassium: 5 mmol/L (ref 3.5–5.1)
Sodium: 147 mmol/L — ABNORMAL HIGH (ref 135–145)

## 2020-02-01 MED ORDER — ACETAMINOPHEN 160 MG/5ML PO SOLN
1000.0000 mg | Freq: Three times a day (TID) | ORAL | Status: DC
  Administered 2020-02-01 (×2): 1000 mg
  Filled 2020-02-01 (×3): qty 40.6

## 2020-02-01 MED ORDER — VENLAFAXINE HCL 25 MG PO TABS
25.0000 mg | ORAL_TABLET | Freq: Two times a day (BID) | ORAL | Status: DC
  Administered 2020-02-01 – 2020-02-08 (×13): 25 mg
  Filled 2020-02-01 (×15): qty 1

## 2020-02-01 NOTE — Progress Notes (Signed)
PROGRESS NOTE    Miranda Roman  JXB:[id number] DOB: [birth year] DOA: 01/13/2020 PCP: Glendale Chard, MD   Brief Narrative:  74 year old with a history of DM2, CKD stage IV, [place] trait, B thalasemia, and prior bariatric surgery who was transferred to [facility] from [facility] where she originally presented 12/21/2019 with complaints of chest pain.  A VQ scan and Dopplers were negative at that facility, and a cardiac work-up revealed only moderate pulmonary hypertension.  Her hospital course was complicated by the sudden onset of encephalopathy leukocytosis and fever.  ID and Neurology were consulted and the patient underwent MRI of the brain and a lumbar puncture, both of which were unrevealing.  EEG was unrevealing.  Her case was further complicated by anemia, thrombocytopenia, and acute kidney injury with consideration being given to possible TTP.  She was treated with 5 rounds of plasmapheresis and steroids without improvement.  Adam TS 13 was normal.  Bone marrow biopsy was performed.  At request of the family she was transferred to [facility].  Work-up after arrival to [facility] has included a negative long-term EEG, negative MRI brain, and negative repeat LP.  She had a repeat course of IVIG x5 days.   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 - MRI brain 7/11 negative for acute findings - Patient was on Keppra 500 mg IV twice daily.  Discussed with son and he states that he feels that she may be more sleepy due to this.  Discussed  with neurology on 01/25/2020, Dr. Lorraine Lax, recommended discontinuing and not tapering Keppra and monitoring closely for seizures. No need to start stimulants at this time.  - Repeat LP 7/12 negative.  - Patient has completed 5 days of IVIG, which was started on 01/16/2020  - Neurology consulted and appreciated, has no further recommendations at this time.  Continue to monitor closely having completed IVIG, improvements can take up to 2 to 4 weeks per neurology. - Per neurology, Dr. Arrie Eastern note from 7/22, feels that Warnicke's could be higher on the differential given her acute decline following hospitalization.  Recommend to continue supportive care and follow-up on CSF autoimmune encephalitis panel/paraneoplastic panel.  Palliative care consult. - Patient was previously able to speak and follow commands on 01/29/2020, however this morning she is not as interactive.  Loose stools - Secondary to tube feeding with no evidence of C. difficile clinically  Hypernatremia - Continue half-normal saline and continue to monitor BMP - Previously  Hypovolemic hyponatremic - likely 2/2 NS bolus/fluids  Diabetes mellitus, type II, uncontrolled - Patient normally uses an insulin pump at baseline - Hemoglobin A1c 7.1 (on 01/14/2020) - Continue Levemir, insulin sliding scale and CBG monitoring   Vitamin D deficiency - Vitamin D, 25-hydroxy currently 43.42 -within normal limits   Anemia of chronic disease/ Sickle cell trait / Beta thalassemia - Hemoglobin currently 7.8 (small drop in the past few days, suspect dilutional given IVF) - Patient did receive 1 unit PRBC on 01/17/2020 - Continue to monitor CBC  Chronic kidney disease, stage IV - Baseline creatinine approximately 2.4  Lab Results  Component Value Date   CREATININE 1.84 (H) 02/01/2020   CREATININE 2.00 (H) 01/31/2020   CREATININE 2.08 (H) 01/30/2020   Hyperkalemia - Resolved with lokelma - Continue to monitor BMP  Essential  hypertension - Continue amlodipine, metoprolol - BP stable  Moderate protein calorie malnutrition with dysphagia - Not a candidate for PEG tube given history of gastric bypass  - Will have to consider J-tube per general surgery if long-term tube feeds requirement - s/p Cortrak placement - Currently on glucerna  L1-L5 vertebral body/sacral sclerosis - Noted per [facility]ology at [facility] -felt to be due to sickle cell trait  Asymptomatic bacteriuria - UA shows rare bacteria, large leukocytes, >50 WBC - Family concerned given that the urine was dark in color - Urine culture multiple species- suggest recollection - Continue IVF   Goals of care - Palliative care consulted and appreciated, planning on meeting on 02/01/20  DVT Prophylaxis  Heparin- will hold given drop in hemoglobin and place on SCDs  Code Status: Full  Family Communication: None at bedside. Daughter and son via phone.  Disposition Plan:  Status is: Inpatient  Remains inpatient appropriate because:Altered mental status, IV treatments appropriate due to intensity of illness or inability to take PO and Inpatient level of care appropriate due to severity of illness   Dispo: The patient is from: Home              Anticipated d/c is to: SNF              Anticipated d/c date is: > 3 days              Patient currently is not medically stable to d/c.   Consultants Neurology   Procedures  EEG Lumbar puncture Cortrak  Antibiotics   Anti-infectives (From admission, onward)   None      Subjective:   [name] seen and examined today.   Not speaking with me this morning or following commands.  Objective:   Vitals:   01/31/20 1651 01/31/20 2004 01/31/20 2304 02/01/20 0320  BP: 122/69 127/67 (!) 129/88 (!) 129/76  Pulse: 97 101 103 93  Resp: _0 Temp: 98.9 F (37.2 C) 98.3 F (36.8 C) (!) 97.5 F (36.4 C) 97.7 F (36.5 C)  TempSrc: Oral Oral Oral Oral  SpO2: 100% 99% 98% 100%   Weight:        Intake/Output Summary (Last 24 hours) at 02/01/2020 0748 Last data filed at 02/01/2020 0410 Gross per 24 hour  Intake 660 ml  Output 1520 ml  Net -860 ml   Filed Weights   01/28/20 0316 01/29/20 0342 01/30/20 0408  Weight: 78.7 kg 78.9 kg 78.2 kg   Exam  General: Well developed, well nourished, NAD, minimally interactive and occasionally follows commands, attempting to speak with poor effort  HEENT: NCAT, mucous membranes moist.   Cardiovascular: S1 S2 auscultated, RRR.  Respiratory: Clear to auscultation bilaterally   Abdomen: Soft, nontender, nondistended, + bowel sounds  Extremities: warm dry without cyanosis clubbing or edema  Neuro: Awake and alert, tracking movement, not speaking  Data Reviewed: I have personally reviewed following labs and imaging studies  CBC: Recent Labs  Lab 01/27/20 1247 01/29/20 0424 01/30/20 0320 01/31/20 0314 02/01/20 0300  WBC  --   --   --   --  7.9  HGB 9.2* 8.2* 8.1* 7.8* 8.1*  HCT 29.4* 27.2* 26.8* 26.7* 27.4*  MCV  --   --   --   --  88.1  PLT  --   --   --   --  929   Basic Metabolic Panel: Recent Labs  Lab 01/28/20 1215 01/29/20 0424 01/30/20 0320 01/31/20 0314 02/01/20 0300  NA 140 144 145 146* 147*  K 5.6* 5.1 5.2* 5.0 5.0  CL 108 110 111 111 113*  CO2 _0 GLUCOSE 180* 142* 92 121* 117*  BUN 94* 91* 91* 86* 76*  CREATININE 2.02* 1.95* 2.08* 2.00* 1.84*  CALCIUM 8.1* 8.1* 8.1* 7.8* 8.0*   GFR: Estimated Creatinine Clearance: 28.4 mL/min (A) (by C-G formula based on SCr of 1.84 mg/dL (H)). Liver Function Tests: No results for input(s): AST, ALT, ALKPHOS, BILITOT, PROT, ALBUMIN in the last 168 hours. No results for input(s): LIPASE, AMYLASE in the last 168 hours. No results for input(s): AMMONIA in the last 168 hours. Coagulation Profile: No results for input(s): INR, PROTIME in the last 168 hours. Cardiac Enzymes: No results for input(s): CKTOTAL, CKMB, CKMBINDEX, TROPONINI in the  last 168 hours. BNP (last 3 results) No results for input(s): PROBNP in the last 8760 hours. HbA1C: No results for input(s): HGBA1C in the last 72 hours. CBG: Recent Labs  Lab 01/31/20 1154 01/31/20 1653 01/31/20 2002 01/31/20 2300 02/01/20 0317  GLUCAP 155* 187* 199* 146* 111*   Lipid Profile: No results for input(s): CHOL, HDL, LDLCALC, TRIG, CHOLHDL, LDLDIRECT in the last 72 hours. Thyroid Function Tests: No results for input(s): TSH, T4TOTAL, FREET4, T3FREE, THYROIDAB in the last 72 hours. Anemia Panel: No results for input(s): VITAMINB12, FOLATE, FERRITIN, TIBC, IRON, RETICCTPCT in the last 72 hours. Urine analysis:    Component Value Date/Time   COLORURINE YELLOW 01/29/2020 1147   APPEARANCEUR HAZY (A) 01/29/2020 1147   LABSPEC 1.012 01/29/2020 1147   PHURINE 7.0 01/29/2020 1147   GLUCOSEU NEGATIVE 01/29/2020 1147   HGBUR LARGE (A) 01/29/2020 1147   BILIRUBINUR NEGATIVE 01/29/2020 1147   BILIRUBINUR negative 09/28/2019 1556   KETONESUR NEGATIVE 01/29/2020 1147   PROTEINUR 100 (A) 01/29/2020 1147   UROBILINOGEN 0.2 09/28/2019 1556   NITRITE NEGATIVE 01/29/2020 1147   LEUKOCYTESUR LARGE (A) 01/29/2020 1147   Sepsis Labs:  Recent Results (from the past 240 hour(s))  Culture, Urine     Status: Abnormal   Collection Time: 01/29/20  1:31 PM   Specimen: Urine, Clean Catch  Result Value Ref Range Status   Specimen Description URINE, CLEAN CATCH  Final   Special Requests   Final    NONE Performed at [facility], [place] [address], [place], [place] [address]    Culture MULTIPLE SPECIES PRESENT, SUGGEST RECOLLECTION (A)  Final   Report Status 01/30/2020 FINAL  Final    Radiology Studies: No results found.  Scheduled Meds: . amLODipine  10 mg Per Tube Daily  . chlorhexidine  15 mL Mouth Rinse BID  . free water  170 mL Per Tube Q4H  . heparin  5,000 Units Subcutaneous Q8H  . insulin aspart  0-15 Units Subcutaneous Q4H  . insulin aspart  4 Units Subcutaneous  Q4H  . insulin detemir  12 Units Subcutaneous BID  . lidocaine  1 patch Transdermal Q24H  . mouth rinse  15 mL Mouth Rinse q12n4p  . metoprolol tartrate  100 mg Per Tube BID  . sennosides  5 mL Per Tube BID  . thiamine  100 mg Per Tube Daily   Continuous Infusions: . sodium chloride 50 mL/hr at 02/01/20 0500  . feeding supplement (GLUCERNA 1.5  CAL) 1,000 mL (02/01/20 0135)     LOS: 19 days   Time Spent in minutes   30 minutes  Little Ishikawa D.O. on 02/01/2020 at 7:48 AM

## 2020-02-01 NOTE — Progress Notes (Addendum)
Daily Progress Note   Patient Name: Miranda Roman       Date: 02/01/2020 DOB: 1945/09/27  Age: 74 y.o. MRN#: [id number] Attending Physician: Little Ishikawa, MD Primary Care Physician: Glendale Chard, MD Admit Date: 01/13/2020  Reason for Consultation/Follow-up: Establishing goals of care  Family Discussion: 02/01/20   Provider: Elie Confer NP   Participants: Tedra Coupe (daughter) and Keron Koffman (son). Also Lari Linson (son) by phone.  I met with patient's family in the 3W consultation room. I introduced Palliative Medicine as specialized medical care for people living with chronic serious illness. It focuses on providing relief from the symptoms and stress of a serious illness.   We discussed the patient's hospital course so far, and a detailed description of her present health state was reviewed.    We discuss that this situation is more difficult because she was a high functioning, independent, and vibrant women prior to this acute illness.    Discussed that patient recently has showed improvement in mental status; she spoke for the first time on 01/29/20. Prior to this, she had been non-verbal for approximately 35 days. Family is encouraged by this improvement.   Discussed Wernicke Encephalopathy, as it is the more likely differential diagnosis per neurology. Discussed that it is a serious condition resulting in significant disability. Treatment with thiamine can result in partial improvement in some cases. However, in many cases it is not reversible and the neurological deficits become chronic.   Discussed that if her condition does not improve, she would require long-term artifical feeding and skilled care. [name] shares that he has already been thinking about this  possibility. Family expresses concern that this would not be good quality of life for her.   [name] then shares several concerns with me. She thinks her mother is experiencing pain. Reports a history of back pain in August 2020. At this time, MRI spine showed multilevel degenerative disc disease and left-sided facet arthritis. Patient currently has prn tylenol and lidocaine patches; [name] does not think this regimen is helping. She reports her mother has been restless and "yells out" every time she is turned/moved. The second concern [name] has is regarding the red color to the urine (starting 7/25) and now dark colored stools. [name] is concerned patient is not on protonix.     Discussed  the importance of continued conversation with family and their  medical providers regarding overall plan of care and treatment options, ensuring decisions are within the context of the patients values and GOCs.   Plan for additional discussions to ensue in the oncoming days. Family was provided with my contact info and  encouraged to call with questions or concerns.    Length of Stay: 19  Current Medications: Scheduled Meds:  . acetaminophen (TYLENOL) oral liquid 160 mg/5 mL  1,000 mg Per Tube TID  . amLODipine  10 mg Per Tube Daily  . chlorhexidine  15 mL Mouth Rinse BID  . free water  170 mL Per Tube Q4H  . heparin  5,000 Units Subcutaneous Q8H  . insulin aspart  0-15 Units Subcutaneous Q4H  . insulin aspart  4 Units Subcutaneous Q4H  . insulin detemir  12 Units Subcutaneous BID  . lidocaine  1 patch Transdermal Q24H  . mouth rinse  15 mL Mouth Rinse q12n4p  . metoprolol tartrate  100 mg Per Tube BID  . sennosides  5 mL Per Tube BID  . thiamine  100 mg Per Tube Daily    Continuous Infusions: . sodium chloride 50 mL/hr at 02/01/20 0500  . feeding supplement (GLUCERNA 1.5 CAL) 1,000 mL (02/01/20 0135)    PRN Meds: docusate, labetalol, polyethylene glycol, senna-docusate  Physical Exam Vitals  reviewed.  Constitutional:      General: She is not in acute distress. Pulmonary:     Effort: Pulmonary effort is normal.  Abdominal:     Comments: Tube feeding via cortrak   Neurological:     Mental Status: She is alert.     Comments: Moves upper extremities spontaneously, tracks, but does not follow commands during my assessment            Vital Signs: BP (!) 147/74 (BP Location: Left Leg)   Pulse 93   Temp 98.6 F (37 C) (Axillary)   Resp 18   Wt 78.2 kg   SpO2 100%   BMI 28.87 kg/m  SpO2: SpO2: 100 % O2 Device: O2 Device: Room Air O2 Flow Rate:      Palliative Care Assessment & Plan   HPI/Patient Profile: 74 y.o. female  with past medical history of DM type 2, hypertension, chronic kidney disease stage IV, B thalasemia, and prior bariatric surgery. She was transferred to [facility] from [facility] on 01/13/2020 with encephalopathy of unclear origin. Patient was admitted to [facility] 12/20/19 while traveling with initial presentation of chest pain. Cardiac work-up revealed only moderate pulmonary hypertension. Her hospital course was complicated by sudden onset of AMS, fever, and leukocytosis. ID and neurology were consulted and the patient underwent EEG,  MRI of the brain, and a lumbar puncture, all of which were unrevealing. Her case was further complicated by anemia, thrombocytopenia, and acute kidney injury with consideration of possible TTP. Bone marrow biopsy was done. She was treated with 5 rounds of plasmapheresis and steroids without improvement. At the request of the family, patient was transferred to [facility]. Work-up at [facility] included negative continuous EEG, negative MRI brain, and negative repeat LP. She also had a repeat course of IVIG x 5 days.  Acute multifactorial encephalopathy--autoimmune versus Wernicke Encephalopathy. Per neurology, Wernicke Encephalopathy is the more likely differential at this point.   Recommendations/Plan: - Full scope treatment, with ongoing  discussions - Family remains hopeful for improvement, but understand this may not be the case - Spoke with Dr. Avon Gully regarding dark colored stools,  plan to re-start PPI - I will follow up with family when I return on 02/05/20  Symptom Management: - Scheduled Tylenol 1000 mg three times daily per tube - Venlafaxine 25 mg BID per tube (for possible acute on chronic back pain)  Prognosis:  Unable to determine  Discharge Planning: To Be Determined  Care plan was discussed with Dr. Avon Gully, Dr. Hilma Favors  Thank you for allowing the Palliative Medicine Team to assist in the care of this patient.   Total Time 65 minutes Prolonged Time Billed  yes       Greater than 50%  of this time was spent counseling and coordinating care related to the above assessment and plan.  Lavena Bullion, NP  Please contact Palliative Medicine Team phone at [phone] for questions and concerns.

## 2020-02-01 NOTE — Progress Notes (Signed)
Nutrition Follow-up  DOCUMENTATION CODES:   Not applicable   INTERVENTION:  Continue via Cortrak: -Glucerna 1.5 @ 38m/hr -1732mfree water flush Q4H   Tube feeding regimen provides 1980 kcal (99% of needs), 112 grams of protein, and 1002 ml of H2O. Total free water: 2022 ml daily   NUTRITION DIAGNOSIS:   Inadequate oral intake related to inability to eat as evidenced by NPO status.  Ongoing  GOAL:   Patient will meet greater than or equal to 90% of their needs  Met with TF  MONITOR:   Labs, I & O's, TF tolerance, Weight trends, Diet advancement  REASON FOR ASSESSMENT:   Consult Enteral/tube feeding initiation and management  ASSESSMENT:   [age][facility] being [facility] there initially for chest pain on June 15th while vacationing. Patient subesequently developed AMS, fever, thrombocytopenia, TTP was ruled out, underwent 5 rounds of plasmapheresis and started on steroids with no significant improvement. Past medical history significant for HTN, CKD IV, IDDM, history of gastric bypass (2011).  7/12 s/p LP (negative); pt removed NGT 7/13 IVIG started 7/14 Cortrak replaced (per KUB, tip in distal stomach region)  Per Neurology, pt's acute multifactorial encephalopathy is likely autoimmune vs Wernicke's encephalopathy; however, MD feels that Wernicke's is higher on the differential given the pt's acute decline following hospitalization.   Pt was noted to be able to speak and follow commands on 7/25, but has not been interactive over the last 24 hours. Palliative Care will be meeting with pt's family today to establish [place]Per RN, this is to occur around 12:30pm today. Pt sleeping at time of RD visit.   Current TF via Cortrak: Glucerna 1.5 cal @ 5551mr, 170m67mee water Q4H  Labs: Na 147 (H), CBGs 111-199 Medications: Novolog, Levemir, Senokot, thiamine IVF: 1/2 NS @ 50ml42m Diet Order:   Diet  Order            Diet NPO time specified  Diet effective now                 EDUCATION NEEDS:   No education needs have been identified at this time  Skin:  Skin Assessment: Skin Integrity Issues: Skin Integrity Issues:: Other (Comment) Stage I: perineum Other: MASD sacrum  Last BM:  7/27  Height:   Ht Readings from Last 1 Encounters:  09/28/19 5' 4.8" (1.646 m)    Weight:   Wt Readings from Last 1 Encounters:  01/30/20 78.2 kg    BMI:  Body mass index is 28.87 kg/m.  Estimated Nutritional Needs:   Kcal:  2000-2200  Protein:  95-110  Fluid:  >/= 2 L/day    AmandLarkin Ina RD, LDN RD pager number and weekend/on-call pager number located in [facility]

## 2020-02-02 DIAGNOSIS — Z7189 Other specified counseling: Secondary | ICD-10-CM

## 2020-02-02 DIAGNOSIS — G9340 Encephalopathy, unspecified: Secondary | ICD-10-CM | POA: Diagnosis not present

## 2020-02-02 DIAGNOSIS — Z8739 Personal history of other diseases of the musculoskeletal system and connective tissue: Secondary | ICD-10-CM

## 2020-02-02 DIAGNOSIS — E639 Nutritional deficiency, unspecified: Secondary | ICD-10-CM

## 2020-02-02 LAB — GLUCOSE, CAPILLARY
Glucose-Capillary: 108 mg/dL — ABNORMAL HIGH (ref 70–99)
Glucose-Capillary: 119 mg/dL — ABNORMAL HIGH (ref 70–99)
Glucose-Capillary: 184 mg/dL — ABNORMAL HIGH (ref 70–99)
Glucose-Capillary: 80 mg/dL (ref 70–99)
Glucose-Capillary: 81 mg/dL (ref 70–99)
Glucose-Capillary: 95 mg/dL (ref 70–99)

## 2020-02-02 LAB — COMPREHENSIVE METABOLIC PANEL
ALT: 1221 U/L — ABNORMAL HIGH (ref 0–44)
AST: 502 U/L — ABNORMAL HIGH (ref 15–41)
Albumin: 2 g/dL — ABNORMAL LOW (ref 3.5–5.0)
Alkaline Phosphatase: 153 U/L — ABNORMAL HIGH (ref 38–126)
Anion gap: 11 (ref 5–15)
BUN: 70 mg/dL — ABNORMAL HIGH (ref 8–23)
CO2: 22 mmol/L (ref 22–32)
Calcium: 8.2 mg/dL — ABNORMAL LOW (ref 8.9–10.3)
Chloride: 114 mmol/L — ABNORMAL HIGH (ref 98–111)
Creatinine, Ser: 1.79 mg/dL — ABNORMAL HIGH (ref 0.44–1.00)
GFR calc Af Amer: 32 mL/min — ABNORMAL LOW (ref >60)
GFR calc non Af Amer: 28 mL/min — ABNORMAL LOW (ref >60)
Glucose, Bld: 74 mg/dL (ref 70–99)
Potassium: 4.8 mmol/L (ref 3.5–5.1)
Sodium: 147 mmol/L — ABNORMAL HIGH (ref 135–145)
Total Bilirubin: 0.4 mg/dL (ref 0.3–1.2)
Total Protein: 6.6 g/dL (ref 6.5–8.1)

## 2020-02-02 LAB — URINE CULTURE

## 2020-02-02 LAB — CBC
HCT: 27.6 % — ABNORMAL LOW (ref 36.0–46.0)
Hemoglobin: 8.3 g/dL — ABNORMAL LOW (ref 12.0–15.0)
MCH: 26.7 pg (ref 26.0–34.0)
MCHC: 30.1 g/dL (ref 30.0–36.0)
MCV: 88.7 fL (ref 80.0–100.0)
Platelets: 308 10*3/uL (ref 150–400)
RBC: 3.11 MIL/uL — ABNORMAL LOW (ref 3.87–5.11)
RDW: 18.3 % — ABNORMAL HIGH (ref 11.5–15.5)
WBC: 10.1 10*3/uL (ref 4.0–10.5)
nRBC: 0.4 % — ABNORMAL HIGH (ref 0.0–0.2)

## 2020-02-02 LAB — AMMONIA: Ammonia: 25 umol/L (ref 9–35)

## 2020-02-02 MED ORDER — ENSURE ENLIVE PO LIQD
237.0000 mL | Freq: Three times a day (TID) | ORAL | Status: DC
  Administered 2020-02-02 – 2020-02-07 (×14): 237 mL via ORAL

## 2020-02-02 MED ORDER — GLUCERNA 1.5 CAL PO LIQD
840.0000 mL | ORAL | Status: DC
  Administered 2020-02-03 – 2020-02-05 (×5): 840 mL
  Filled 2020-02-02 (×4): qty 948

## 2020-02-02 MED ORDER — PROSOURCE TF PO LIQD
45.0000 mL | Freq: Three times a day (TID) | ORAL | Status: DC
  Administered 2020-02-03 – 2020-02-06 (×10): 45 mL
  Filled 2020-02-02 (×12): qty 45

## 2020-02-02 MED ORDER — PANTOPRAZOLE SODIUM 40 MG PO PACK
40.0000 mg | PACK | Freq: Every day | ORAL | Status: DC
  Administered 2020-02-03 – 2020-02-08 (×6): 40 mg
  Filled 2020-02-02 (×7): qty 20

## 2020-02-02 NOTE — Progress Notes (Signed)
Physical Therapy Treatment Patient Details Name: Miranda Roman MRN: [id number] DOB: [birth year] Today's Date: 02/02/2020    History of Present Illness Patient is a 74 year old female. 3 weeks prior she injured her foot tubing on vacation. The next day she began to have chest pain and quickly developed acute encephalopathy. She was flown from [place] (on vacation) to [place] where she lives. 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 She states name at beginning of session, otherwise was non verbal and followed few commands. Bed was placed in chair position for session and pt was assisted in lifting back off bed to sit upright. She was able to tolerate 3 bouts for ~2 min each before fatiguing. PROM was performed on LEs. Son and daughter present report they have been educated on and perform PROM on pt regularly.  Pt left in chair position at end of session with bed shortened to allow WBing through LEs. Current plan remains appropriate. Will continue to follow acutely.    Follow Up Recommendations  SNF     Equipment Recommendations  None recommended by PT    Recommendations for Other Services       Precautions / Restrictions Precautions Precautions: Fall;Other (comment) Precaution Comments: feeding tube Required Braces or Orthoses:  (bilateral resting hand splints to be worn at night)    Mobility  Bed Mobility Overal bed mobility: Needs Assistance Bed Mobility: Supine to Sit     Supine to sit: Total assist;+2 for physical assistance;HOB elevated     General bed mobility comments: Total A +2 to scoot pt up in bed prior to placing bed in chair position.  Transfers                 General transfer comment: unable to safely attempt   Ambulation/Gait                 Stairs             Wheelchair Mobility    Modified Rankin (Stroke Patients  Only)       Balance Overall balance assessment: Needs assistance Sitting-balance support: No upper extremity supported;Feet supported Sitting balance-Leahy Scale: Zero Sitting balance - Comments: Pt required max to total A to maintain unsupported sitting with bed in chair position Postural control: Posterior lean;Right lateral lean     Standing balance comment: unable                            Cognition Arousal/Alertness: Awake/alert Behavior During Therapy: Flat affect Overall Cognitive Status: Difficult to assess Area of Impairment: Following commands;Attention;Problem solving                   Current Attention Level: Focused   Following Commands: Follows one step commands inconsistently     Problem Solving: Slow processing;Difficulty sequencing;Decreased initiation;Requires verbal cues;Requires tactile cues General Comments: Pt attempting to follow some commands and able to answer when asked her name, but mostly non verbal.      Exercises General Exercises - Lower Extremity Long Arc Quad: PROM;Both;5 reps;Seated Heel Slides: PROM;Both;10 reps;Seated Other Exercises Other Exercises: Bed placed in chair position and pt assisted in lifting back off bed to sit upright. Total to max A to remain seated upright. Multimodal cues to [facility] hands to assist in seated balance. Pt unable to hold position w/o assist.  Performed 3x for ~2 min each.    General Comments General comments (skin integrity, edema, etc.): son and daughter present for session. At times she would give small head nods to respond to questions.      Pertinent Vitals/Pain Pain Assessment: Faces Faces Pain Scale: Hurts a little bit Pain Location: Mild grimacing with PROM on R hip Pain Descriptors / Indicators: Grimacing;Discomfort Pain Intervention(s): Monitored during session;Limited activity within patient's tolerance;Repositioned    Home Living                      Prior  Function            PT Goals (current goals can now be found in the care plan section) Acute Rehab PT Goals Patient Stated Goal: pt unable to state.  Family is hoping that she will improve and be able to engage and be as independent as possible  PT Goal Formulation: With family Time For Goal Achievement: 02/04/20    Frequency    Min 2X/week      PT Plan Current plan remains appropriate    Co-evaluation              AM-PAC PT "6 Clicks" Mobility   Outcome Measure  Help needed turning from your back to your side while in a flat bed without using bedrails?: Total Help needed moving from lying on your back to sitting on the side of a flat bed without using bedrails?: Total Help needed moving to and from a bed to a chair (including a wheelchair)?: Total Help needed standing up from a chair using your arms (e.g., wheelchair or bedside chair)?: Total Help needed to walk in hospital room?: Total Help needed climbing 3-5 steps with a railing? : Total 6 Click Score: 6    End of Session   Activity Tolerance: Patient limited by fatigue Patient left: in bed;with call bell/phone within reach;with bed alarm set;with family/visitor present (in chair position) Nurse Communication: Mobility status PT Visit Diagnosis: Other abnormalities of gait and mobility (R26.89);Muscle weakness (generalized) (M62.81);Other symptoms and signs involving the nervous system (R29.898)     Time: 3128-1188 PT Time Calculation (min) (ACUTE ONLY): 23 min  Charges:  $Neuromuscular Re-education: 23-37 mins                     Benjiman Core, [place] Pager [phone] Acute Rehab   Allena Katz 02/02/2020, 2:55 PM

## 2020-02-02 NOTE — Progress Notes (Signed)
Nutrition Follow-up  DOCUMENTATION CODES:   Not applicable  INTERVENTION:  1:1 feeding assistance with all meals  Calorie Count   Ensure Enlive po TID, each supplement provides 350 kcal and 20 grams of protein   Magic cup TID with meals, each supplement provides 290 kcal and 9 grams of protein   If pt not meeting >/=75% estimated needs po, Cortrak team to either unclog or replace the Cortrak and nocturnal tube feeding should be initiated: -Glucerna 1.5 cal @ 40m from 1800-0600 -43mProsource TF TID  Nocturnal TF regimen would provide 1380 kcals (meets 69% estimated needs), 102 grams of protein, 6372mree water    NUTRITION DIAGNOSIS:   Inadequate oral intake related to inability to eat as evidenced by NPO status.  Progressing, pt now on Dysphagia 1 diet with thin liquids  GOAL:   Patient will meet greater than or equal to 90% of their needs  Progressing  MONITOR:   Labs, I & O's, TF tolerance, Weight trends, Diet advancement  REASON FOR ASSESSMENT:   Consult Enteral/tube feeding initiation and management  ASSESSMENT:   [age]ar old [facility] after being admitted there initially for chest pain on June 15th while vacationing. Patient subesequently developed AMS, fever, thrombocytopenia, TTP was ruled out, underwent 5 rounds of plasmapheresis and started on steroids with no significant improvement. Past medical history significant for HTN, CKD IV, IDDM, history of gastric bypass (2011).  RD received secure chat from SLP regarding pt's diet being advanced to dysphagia 1 with thin liquids. SLP requested RD adjust TF regimen to stimulate appetite.   Per MD documentation, pt's Cortrak is clogged and nursing has been unsuccessful with attempts at unclogging. Cortrak team unavailable until tomorrow to either unclog/replace the tube. RD will order calorie count and oral nutrition supplements to help pt meet  calorie/protein needs po. Discussed plan to order calorie count with RN. If pt does well with PO intake tonight/tomorrow, may consider d/c Cortrak. Otherwise, Cortrak team will attempt to unclog the tube and will replace the tube if it cannot be unclogged. Will leave orders for nocturnal TF regimen to begin tomorrow if necessary.   Current TF via Cortrak: Glucerna 1.5 cal @ 9m66m, 170ml56me water Q4H  Labs: Na 147 (H), CBGs 80-81-95 Medications: Novolog, Levemir, Senokot, thiamine, Protonix IVF: 1/2 NS @ 50ml/3m Diet Order:   Diet Order            DIET - DYS 1 Room service appropriate? Yes with Assist; Fluid consistency: Thin  Diet effective now                 EDUCATION NEEDS:   No education needs have been identified at this time  Skin:  Skin Assessment: Skin Integrity Issues: Skin Integrity Issues:: Other (Comment) Stage I: perineum Other: MASD sacrum  Last BM:  7/28 type 6  Height:   Ht Readings from Last 1 Encounters:  09/28/19 5' 4.8" (1.646 m)    Weight:   Wt Readings from Last 1 Encounters:  02/02/20 76.6 kg    BMI:  Body mass index is 28.28 kg/m.  Estimated Nutritional Needs:   Kcal:  2000-2200  Protein:  95-110  Fluid:  >/= 2 L/day    AmandaLarkin InaRD, LDN RD pager number and weekend/on-call pager number located in [email or web address]

## 2020-02-02 NOTE — Progress Notes (Addendum)
PROGRESS NOTE    Miranda Roman  TZG:[id number] DOB: [birth year] DOA: 01/13/2020 PCP: Glendale Chard, MD   Brief Narrative:  74 year old with a history of DM2, CKD stage IV, [place] trait, B thalasemia, and prior bariatric surgery who was transferred to [facility] from [facility] where she originally presented 12/21/2019 with complaints of chest pain.  A VQ scan and Dopplers were negative at that facility, and a cardiac work-up revealed only moderate pulmonary hypertension.  Her hospital course was complicated by the sudden onset of encephalopathy leukocytosis and fever.  ID and Neurology were consulted and the patient underwent MRI of the brain and a lumbar puncture, both of which were unrevealing.  EEG was unrevealing.  Her case was further complicated by anemia, thrombocytopenia, and acute kidney injury with consideration being given to possible TTP.  She was treated with 5 rounds of plasmapheresis and steroids without improvement.  Adam TS 13 was normal.  Bone marrow biopsy was performed.  At request of the family she was transferred to [facility].  Work-up after arrival to [facility] has included a negative long-term EEG, negative MRI brain, and negative repeat LP.  She had a repeat course of IVIG x5 days.   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 - MRI brain 7/11 negative for acute findings - Patient was on Keppra 500 mg IV twice daily.  Discussed with son and he states that he feels that she may be more sleepy due to this.  Discussed with neurology on 01/25/2020, Dr. Lorraine Lax, recommended discontinuing and not tapering Keppra and monitoring closely for seizures. No need to start stimulants at this time.  - Repeat LP 7/12 negative.  - Patient has completed 5 days of IVIG, which was started on 01/16/2020  - Neurology consulted and appreciated, has no further recommendations at this time.  Continue to monitor closely having completed IVIG, improvements can take up to 2 to 4 weeks per neurology. - Per neurology, Dr. Arrie Eastern note from 7/22, feels that Warnicke's could be higher on the differential given her acute decline following hospitalization.  Recommend to continue supportive care and follow-up on CSF autoimmune encephalitis panel/paraneoplastic panel.  Palliative care consult. - Patient mental status continues to wax and wane generally improving over the past 72 hours -7/29 AST ALT elevated as below, ammonia remains within normal limits  Elevated AST ALT  -Unclear etiology, normal at intake 3 weeks ago -We will discontinue scheduled Tylenol, unclear if this is primary driving factor or even causative given it was initiated just 24 hours ago -Abdomen benign, follow morning labs if continues to worsen  we will consider imaging and possible consult with GI Lab Results  Component Value Date   ALT 1,221 (H) 02/02/2020   AST 502 (H) 02/02/2020   ALKPHOS 153 (H) 02/02/2020   BILITOT 0.4 02/02/2020     Aspiration risk  -Patient had episode of vomiting overnight, lungs remain clear without hypoxia  or symptoms -NG tube dysfunction overnight concern for clubbing, core track team unavailable, attempting to dislodge with nursing staff -Outlined the need for family decision on PEG tube versus J-tube placement, reconsulting speech therapy to further evaluate this week and weekend it was patient can advance diet, however if she cannot a prolonged NG tube is contraindicated  Loose stools - Secondary to tube feeding with no evidence of infectious process  Hypernatremia, questionably hypovolemic - Continue half-normal saline and continue to monitor BMP - Previously  Hypovolemic hyponatremic - likely 2/2 NS bolus/fluids  Diabetes mellitus, type II, uncontrolled - Patient normally uses an insulin pump at baseline - Hemoglobin A1c 7.1 (on 01/14/2020) - Continue Levemir, insulin sliding scale and CBG monitoring   Vitamin D deficiency - Vitamin D, 25-hydroxy currently 43.42 -within normal limits   Anemia of chronic disease/ Sickle cell trait / Beta thalassemia - Hemoglobin currently 7.8 (small drop in the past few days, suspect dilutional given IVF) - Patient did receive 1 unit PRBC on 01/17/2020 - Continue to monitor CBC  Chronic kidney disease, stage IV - Baseline creatinine approximately 2.4 per chart review Lab Results  Component Value Date   CREATININE 1.79 (H) 02/02/2020   CREATININE 1.84 (H) 02/01/2020   CREATININE 2.00 (H) 01/31/2020   Hyperkalemia - Resolved with lokelma - Continue to monitor BMP  Essential hypertension - Continue amlodipine, metoprolol - BP stable  Moderate protein calorie malnutrition with dysphagia - Not a candidate for PEG tube given history of gastric bypass  - Will have to consider J-tube per general surgery if long-term tube feeds requirement - s/p Cortrak placement - Currently on glucerna  L1-L5 vertebral body/sacral sclerosis - Noted per Hematology at [facility] -felt to be due to sickle cell trait  Asymptomatic bacteriuria - UA  shows rare bacteria, large leukocytes, >50 WBC - Family concerned given that the urine was dark in color - Urine culture multiple species- suggest recollection - Continue IVF   Goals of care - Palliative care consulted and appreciated, planning on meeting on 02/01/20  DVT Prophylaxis  Heparin- will hold given drop in hemoglobin and place on SCDs  Code Status: Full  Family Communication: None at bedside. Daughter and son via phone.  Disposition Plan:  Status is: Inpatient  Remains inpatient appropriate because:Altered mental status, IV treatments appropriate due to intensity of illness or inability to take PO and Inpatient level of care appropriate due to severity of illness   Dispo: The patient is from: Home              Anticipated d/c is to: SNF              Anticipated d/c date is: > 3 days              Patient currently is not medically stable to d/c.   Consultants Neurology   Procedures  EEG Lumbar puncture Cortrak  Antibiotics   Anti-infectives (From admission, onward)   None      Subjective:   Miranda Roman seen and examined today.  Very limited and markedly delayed speech but does answer simple questions appropriately  Objective:   Vitals:  02/01/20 1933 02/01/20 2326 02/02/20 0333 02/02/20 0438  BP: (!) 111/59 123/72 (!) 129/70   Pulse: 92 88 94   Resp: _0 Temp: 98.9 F (37.2 C) 99 F (37.2 C) 99.5 F (37.5 C)   TempSrc: Oral Oral Oral   SpO2: 100% 100% 97%   Weight:    76.6 kg    Intake/Output Summary (Last 24 hours) at 02/02/2020 0749 Last data filed at 02/02/2020 0000 Gross per 24 hour  Intake --  Output 750 ml  Net -750 ml   Filed Weights   01/29/20 0342 01/30/20 0408 02/02/20 0438  Weight: 78.9 kg 78.2 kg 76.6 kg   Exam  General: Well developed, well nourished, NAD, minimally interactive and occasionally follows commands, speech in one-word broken sentences and responses  HEENT: NCAT, mucous membranes moist.    Cardiovascular: S1 S2 auscultated, RRR.  Respiratory: Clear to auscultation bilaterally   Abdomen: Soft, nontender, nondistended, + bowel sounds  Extremities: warm dry without cyanosis clubbing or edema  Neuro: Awake and alert, tracking movement, not speaking  Data Reviewed: I have personally reviewed following labs and imaging studies  CBC: Recent Labs  Lab 01/29/20 0424 01/30/20 0320 01/31/20 0314 02/01/20 0300 02/02/20 0229  WBC  --   --   --  7.9 10.1  HGB 8.2* 8.1* 7.8* 8.1* 8.3*  HCT 27.2* 26.8* 26.7* 27.4* 27.6*  MCV  --   --   --  88.1 88.7  PLT  --   --   --  312 353   Basic Metabolic Panel: Recent Labs  Lab 01/29/20 0424 01/30/20 0320 01/31/20 0314 02/01/20 0300 02/02/20 0229  NA 144 145 146* 147* 147*  K 5.1 5.2* 5.0 5.0 4.8  CL 110 111 111 113* 114*  CO2 _1 GLUCOSE 142* 92 121* 117* 74  BUN 91* 91* 86* 76* 70*  CREATININE 1.95* 2.08* 2.00* 1.84* 1.79*  CALCIUM 8.1* 8.1* 7.8* 8.0* 8.2*   GFR: Estimated Creatinine Clearance: 28.9 mL/min (A) (by C-G formula based on SCr of 1.79 mg/dL (H)). Liver Function Tests: Recent Labs  Lab 02/02/20 0229  AST 502*  ALT 1,221*  ALKPHOS 153*  BILITOT 0.4  PROT 6.6  ALBUMIN 2.0*   No results for input(s): LIPASE, AMYLASE in the last 168 hours. No results for input(s): AMMONIA in the last 168 hours. Coagulation Profile: No results for input(s): INR, PROTIME in the last 168 hours. Cardiac Enzymes: No results for input(s): CKTOTAL, CKMB, CKMBINDEX, TROPONINI in the last 168 hours. BNP (last 3 results) No results for input(s): PROBNP in the last 8760 hours. HbA1C: No results for input(s): HGBA1C in the last 72 hours. CBG: Recent Labs  Lab 02/01/20 1202 02/01/20 1656 02/01/20 1937 02/01/20 2323 02/02/20 0331  GLUCAP 146* 135* 144* 100* 80   Lipid Profile: No results for input(s): CHOL, HDL, LDLCALC, TRIG, CHOLHDL, LDLDIRECT in the last 72 hours. Thyroid Function Tests: No results for  input(s): TSH, T4TOTAL, FREET4, T3FREE, THYROIDAB in the last 72 hours. Anemia Panel: No results for input(s): VITAMINB12, FOLATE, FERRITIN, TIBC, IRON, RETICCTPCT in the last 72 hours. Urine analysis:    Component Value Date/Time   COLORURINE YELLOW 01/29/2020 1147   APPEARANCEUR HAZY (A) 01/29/2020 1147   LABSPEC 1.012 01/29/2020 1147   PHURINE 7.0 01/29/2020 1147   GLUCOSEU NEGATIVE 01/29/2020 1147   HGBUR LARGE (A) 01/29/2020 1147   BILIRUBINUR NEGATIVE 01/29/2020 1147   BILIRUBINUR negative 09/28/2019 1556   KETONESUR NEGATIVE  01/29/2020 1147   PROTEINUR 100 (A) 01/29/2020 1147   UROBILINOGEN 0.2 09/28/2019 1556   NITRITE NEGATIVE 01/29/2020 1147   LEUKOCYTESUR LARGE (A) 01/29/2020 1147   Sepsis Labs:  Recent Results (from the past 240 hour(s))  Culture, Urine     Status: Abnormal   Collection Time: 01/29/20  1:31 PM   Specimen: Urine, Clean Catch  Result Value Ref Range Status   Specimen Description URINE, CLEAN CATCH  Final   Special Requests   Final    NONE Performed at [facility], [place] [address] [place], [place] [address]    Culture MULTIPLE SPECIES PRESENT, SUGGEST RECOLLECTION (A)  Final   Report Status 01/30/2020 FINAL  Final    Radiology Studies: No results found.  Scheduled Meds: . acetaminophen (TYLENOL) oral liquid 160 mg/5 mL  1,000 mg Per Tube TID  . amLODipine  10 mg Per Tube Daily  . chlorhexidine  15 mL Mouth Rinse BID  . free water  170 mL Per Tube Q4H  . heparin  5,000 Units Subcutaneous Q8H  . insulin aspart  0-15 Units Subcutaneous Q4H  . insulin aspart  4 Units Subcutaneous Q4H  . insulin detemir  12 Units Subcutaneous BID  . lidocaine  1 patch Transdermal Q24H  . mouth rinse  15 mL Mouth Rinse q12n4p  . metoprolol tartrate  100 mg Per Tube BID  . sennosides  5 mL Per Tube BID  . thiamine  100 mg Per Tube Daily  . venlafaxine  25 mg Per Tube BID   Continuous Infusions: . sodium chloride 50 mL/hr at 02/01/20 2010  . feeding  supplement (GLUCERNA 1.5 CAL) 55 mL/hr at 02/02/20 0400     LOS: 20 days   Time Spent in minutes   30 minutes  Little Ishikawa D.O. on 02/02/2020 at 7:49 AM

## 2020-02-02 NOTE — Evaluation (Signed)
Clinical/Bedside Swallow Evaluation Patient Details  Name: Miranda Roman MRN: [id number] Date of Birth: 11-10-1945  Today's Date: 02/02/2020 Time: SLP Start Time (ACUTE ONLY): 1515 SLP Stop Time (ACUTE ONLY): 1545 SLP Time Calculation (min) (ACUTE ONLY): 30 min  Past Medical History:  Past Medical History:  Diagnosis Date  . Anemia   . Diabetes mellitus   . Hyperlipidemia   . Hypertension   . Osteoporosis   . Sickle cell anemia (HCC)    Past Surgical History:  Past Surgical History:  Procedure Laterality Date  . BONE RESECTION  01/2006   wrist  . 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 TUBAL LIGATION  04/28/1977   HPI:  Patient is a 74 year old female. 3 weeks prior she injured her foot tubing on vacation. The next day she began to have chest pain and quickly developed acute encephalopathy. She was flown from [place] (on vacation) to [place] where she lives. 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   Assessment / Plan / Recommendation Clinical Impression  Pt demonstrates a mild oral dysphagia. Pt remains unable to volitionally follow directions, but provided a bolus, opens mouth, masticates, and swallows. She was seen with ice chips, thin liquids via tsp and straw, purees, and soft solid nutrigrain bar. Pt showed no s/s aspiration with any consistency. Given mech soft solid bar, pt had prolonged mastication, L pocketing, and oral residue. Pt required pureed boluses to clear oral cavity, but was sensate and was noted to use her tongue to lick from L sulci. No obvious oral weakness was observed, but formal oral mech exam could not be completed due to her mentation. Based on today's findings, recommend initiating a PO diet of dysphagia 1 (pureed) solids and thin liquids via straw with meds taken crushed in pureed. Family is anxious for feeding tube to be removed, but were educated that pt  needs to maintain nutrition, so it is important to see how much she eats PO. New diet was communicated to RD, MD, and RN. Pt is expected to require 1:1 A for PO intake.  ST service to follow for diet tolerance/advancement and communication evaluation/treatment as appropriate.    SLP Visit Diagnosis: Dysphagia, oral phase (R13.11)    Aspiration Risk  Mild aspiration risk    Diet Recommendation Thin liquid;Dysphagia 1 (Puree)   Liquid Administration via: Straw Medication Administration: Crushed with puree Supervision: Staff to assist with self feeding Compensations: Small sips/bites;Slow rate Postural Changes: Seated upright at 90 degrees    Other  Recommendations Oral Care Recommendations: Oral care BID   Follow up Recommendations Inpatient Rehab      Frequency and Duration min 3x week  2 weeks       Prognosis Prognosis for Safe Diet Advancement: Good Barriers to Reach Goals: Cognitive deficits      Swallow Study   General Date of Onset: 02/02/20 HPI: Patient is a 74 year old female. 3 weeks prior she injured her foot tubing on vacation. The next day she began to have chest pain and quickly developed acute encephalopathy. She was flown from [place] (on vacation) to [place] where she lives. 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 Type of Study: Bedside Swallow Evaluation Previous Swallow Assessment: n/a Diet Prior to this Study: NPO Temperature Spikes Noted: No Respiratory Status: Room air History of Recent Intubation: No Behavior/Cognition: Alert;Cooperative;Requires cueing Oral Cavity Assessment: Within Functional Limits Oral Care Completed  by SLP: Recent completion by staff Oral Cavity - Dentition: Adequate natural dentition Vision: Functional for self-feeding Self-Feeding Abilities: Total assist Patient Positioning: Upright in bed Baseline Vocal Quality: Low  vocal intensity Volitional Cough: Cognitively unable to elicit Volitional Swallow: Unable to elicit    Oral/Motor/Sensory Function Overall Oral Motor/Sensory Function: Within functional limits   Ice Chips Ice chips: Within functional limits Presentation: Spoon   Thin Liquid Thin Liquid: Within functional limits Presentation: Straw;Spoon    Puree Puree: Within functional limits Presentation: Spoon   Solid     Solid: Impaired Oral Phase Impairments: Impaired mastication Oral Phase Functional Implications: Oral residue;Left lateral sulci pocketing;Impaired mastication     Everson Mott P. Meghin Thivierge, M.S., [place] Pathologist Acute Rehabilitation Services Pager: [place] 02/02/2020,3:50 PM

## 2020-02-03 LAB — COMPREHENSIVE METABOLIC PANEL
ALT: 918 U/L — ABNORMAL HIGH (ref 0–44)
AST: 265 U/L — ABNORMAL HIGH (ref 15–41)
Albumin: 2.1 g/dL — ABNORMAL LOW (ref 3.5–5.0)
Alkaline Phosphatase: 149 U/L — ABNORMAL HIGH (ref 38–126)
Anion gap: 7 (ref 5–15)
BUN: 51 mg/dL — ABNORMAL HIGH (ref 8–23)
CO2: 25 mmol/L (ref 22–32)
Calcium: 8.7 mg/dL — ABNORMAL LOW (ref 8.9–10.3)
Chloride: 116 mmol/L — ABNORMAL HIGH (ref 98–111)
Creatinine, Ser: 1.73 mg/dL — ABNORMAL HIGH (ref 0.44–1.00)
GFR calc Af Amer: 34 mL/min — ABNORMAL LOW (ref >60)
GFR calc non Af Amer: 29 mL/min — ABNORMAL LOW (ref >60)
Glucose, Bld: 104 mg/dL — ABNORMAL HIGH (ref 70–99)
Potassium: 4.4 mmol/L (ref 3.5–5.1)
Sodium: 148 mmol/L — ABNORMAL HIGH (ref 135–145)
Total Bilirubin: 0.6 mg/dL (ref 0.3–1.2)
Total Protein: 7.3 g/dL (ref 6.5–8.1)

## 2020-02-03 LAB — GLUCOSE, CAPILLARY
Glucose-Capillary: 114 mg/dL — ABNORMAL HIGH (ref 70–99)
Glucose-Capillary: 107 mg/dL — ABNORMAL HIGH (ref 70–99)
Glucose-Capillary: 169 mg/dL — ABNORMAL HIGH (ref 70–99)
Glucose-Capillary: 97 mg/dL (ref 70–99)
Glucose-Capillary: 206 mg/dL — ABNORMAL HIGH (ref 70–99)
Glucose-Capillary: 175 mg/dL — ABNORMAL HIGH (ref 70–99)

## 2020-02-03 NOTE — TOC Progression Note (Signed)
Transition of Care [facility]) - Progression Note    Patient Details  Name: Miranda Roman MRN: [id number] Date of Birth: Oct 30, 1945  Transition of Care [facility]) CM/SW [place], [place] Phone Number: 02/03/2020, 3:28 PM  Clinical Narrative:   CSW met with patient's daughter, Miranda Roman, at bedside to provide CMS choice list for SNF for family to start research into SNF options. CSW to fax out referral. Still following for feeding plan, will follow for how patient does over the weekend. CSW to follow back up with family on Monday to discuss SNF options.     Expected Discharge Plan: Skilled Nursing Facility Barriers to Discharge: Ship broker, Continued Medical Work up  Expected Discharge Plan and Services Expected Discharge Plan: [place] Choice: [place] arrangements for the past 2 months: Single Family Home                                       Social Determinants of Health (SDOH) Interventions    Readmission Risk Interventions No flowsheet data found.

## 2020-02-03 NOTE — Progress Notes (Signed)
Calorie Count Note  48 hour calorie count ordered.  Diet: Dysphagia 1 with thin liquids  Supplements: Ensure Enlive TID, Magic cup with meals  Breakfast: 0 Lunch: 0 Dinner: 0 Supplements: 0  Per pt's RN pt has not taken any PO today. Per RN she had difficulty getting the patient to take her pills by mouth. Spoke with Tour manager plan to unclog vs replace tube today.  Pt will need to continue TF to meet nutrition needs and as intake improves will restart calorie count to determine if TF can be weaned.   Lockie Pares., RD, LDN, CNSC See AMiON for contact information

## 2020-02-03 NOTE — Procedures (Signed)
Cortrak  Person Inserting Tube:  Rosezetta Schlatter, RD Tube Type:  Cortrak - 43 inches Tube Location:  Left nare Initial Placement:  Stomach Secured by: Bridle Technique Used to Measure Tube Placement:  Documented cm marking at nare/ corner of mouth Cortrak Secured At:  61 cm Procedure Comments:  Cortrak Tube Team Note:  Consult received to place a Cortrak feeding tube.   No x-ray is required. RN may begin using tube.    If the tube becomes dislodged please keep the tube and contact the Cortrak team at [email or web address] (password [email or web address]) for replacement.  If after hours and replacement cannot be delayed, place a NG tube and confirm placement with an abdominal x-ray.      Jarome Matin, MS, RD, LDN, CNSC Inpatient Clinical Dietitian RD pager # available in [place]  After hours/weekend pager # available in [facility]

## 2020-02-03 NOTE — Progress Notes (Signed)
  Speech Language Pathology Treatment: Dysphagia  Patient Details Name: Miranda Roman MRN: [id number] DOB: 24-Jun-1946 Today's Date: 02/03/2020 Time: 9201-0071 SLP Time Calculation (min) (ACUTE ONLY): 27 min  Assessment / Plan / Recommendation Clinical Impression  Pt alert, daughter present.  Minimal intake today.  She accepted sips of Ensure from a straw with hand-over-hand assist (total) to bring straw to lips; actively drew from straw and initiated sequential sips with no s/s of aspiration, no oral residue post-swallow.  She pressed lips together when offered yogurt.  Deficits are related primarily to initiation more than motivation.  Her daughter, Miranda Roman, and I discussed overall swallowing goals, the potential benefits of longer-term enteral feeding to bridge the time until she can sustain her nutritional needs by PO alone.  SLP will continue to follow; pt will benefit from communication assessment.   HPI HPI: Patient is a 74 year old female. 3 weeks prior she injured her foot tubing on vacation. The next day she began to have chest pain and quickly developed acute encephalopathy. She was flown from [place] (on vacation) to [place] where she lives. 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      SLP Plan  Continue with current plan of care       Recommendations  Diet recommendations: Dysphagia 1 (puree);Thin liquid Liquids provided via: Straw;Cup Medication Administration: Crushed with puree Supervision: Staff to assist with self feeding Compensations: Small sips/bites;Slow rate                Oral Care Recommendations: Oral care BID SLP Visit Diagnosis: Dysphagia, oral phase (R13.11) Plan: Continue with current plan of care       GO               Adarius Tigges L. Tivis Ringer, [place] Office number [phone] Pager  [phone]  [name] 02/03/2020, 4:00 PM

## 2020-02-03 NOTE — Evaluation (Signed)
Speech Language Pathology Evaluation Patient Details Name: Miranda Roman MRN: [id number] DOB: 1945/12/04 Today's Date: 02/03/2020 Time: 2376-2831 SLP Time Calculation (min) (ACUTE ONLY): 17 min  Problem List:  Patient Active Problem List   Diagnosis Date Noted  . History of back pain   . Goals of care, counseling/discussion   . Inadequate oral nutritional intake   . Altered mental status   . Palliative care by specialist   . Pressure injury of skin 01/13/2020  . Acute encephalopathy 01/13/2020  . PAD (peripheral artery disease) ([place]) 06/06/2019  . Pancreatic cyst 06/06/2019  . Class 1 obesity due to excess calories with serious comorbidity and body mass index (BMI) of 31.0 to 31.9 in adult 06/06/2019  . Cyst of left kidney 03/10/2019  . Lumbago 02/27/2019  . Facet arthritis of lumbar region 02/27/2019  . Abnormal CT of the chest 12/28/2018  . Atrophic vaginitis 12/15/2018  . Atypical squamous cells of undetermined significance on cytologic smear of cervix (ASC-US) 12/15/2018  . Overweight with body mass index (BMI) 25.0-29.9 12/15/2018  . 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 Osteoporosis 12/15/2018  . Unspecified dyspareunia (CODE) 12/15/2018  . 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 Gastroesophageal reflux disease 08/26/2016  . Aortic valve regurgitation 02/11/2016  . Chest pain at rest 01/30/2015  . Atherosclerosis of native coronary artery of native heart without angina pectoris 12/24/2012  . Benign essential HTN 12/24/2012  . Hyperlipidemia 12/24/2012  . 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 03/10/2012  . Diabetes mellitus with stage 4 chronic kidney disease ([place]) 03/10/2012   Past Medical History:  Past Medical History:  Diagnosis Date  . Anemia   . Diabetes mellitus   . Hyperlipidemia   . Hypertension   . Osteoporosis   . Sickle cell anemia (HCC)    Past Surgical  History:  Past Surgical History:  Procedure Laterality Date  . BONE RESECTION  01/2006   wrist  . 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 TUBAL LIGATION  04/28/1977   HPI:  Patient is a 74 year old female. 3 weeks prior she injured her foot tubing on vacation. The next day she began to have chest pain and quickly developed acute encephalopathy. She was flown from [place] (on vacation) to [place] where she lives. 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   Assessment / Plan / Recommendation Clinical Impression  Initial communication assessment completed through direct evaluation and daughter, Miranda Roman, report.  Pt presents with significant deficits in initiation, with delays in response time.  She has intermittent islands of speech, which are clearly articulated and may be as long as 4-5 words per utterance, as well as occasional one-two word responses that are spontaneous but unpredictable.  When prompted to produce automatic sequences or to repeat, she is unable.  She follows one-step commands, but not two-step.  She has difficulty discriminating among objects or using BUE with support for direct selection.  Pt will benefit from SLP f/u to address broad communication needs.  Her dtr is in agreement.     SLP Assessment  SLP Recommendation/Assessment: Patient needs continued Speech Lanaguage Pathology Services SLP Visit Diagnosis: Cognitive communication deficit (R41.841)    Follow Up Recommendations       Frequency and Duration min 3x week  2 weeks      SLP Evaluation Cognition  Overall Cognitive Status: Impaired/Different from baseline Arousal/Alertness: Awake/alert Attention: Focused;Sustained Focused Attention: Appears  intact Sustained Attention: Appears intact       Comprehension  Auditory Comprehension Overall Auditory Comprehension: Impaired Yes/No Questions: Impaired Basic  Biographical Questions: 51-75% accurate Commands: Impaired One Step Basic Commands: 0-24% accurate Conversation: Simple Visual Recognition/Discrimination Discrimination: Exceptions to [facility] Common Objects: Able in field of 2 Reading Comprehension Reading Status: Not tested    Expression Expression Primary Mode of Expression: Verbal Verbal Expression Initiation: Impaired Level of Generative/Spontaneous Verbalization: Word Repetition: Impaired Level of Impairment: Word level Common Objects: Able in field of 2 Written Expression Dominant Hand: Right   Oral / Motor  Oral Motor/Sensory Function Overall Oral Motor/Sensory Function: Within functional limits Motor Speech Overall Motor Speech:  (tba)   GO                    [name] 02/03/2020, 4:26 PM  Raymir Frommelt L. Tivis Ringer, [place] Office number [phone] Pager [phone]

## 2020-02-03 NOTE — Progress Notes (Signed)
PROGRESS NOTE    Miranda Roman  UJW:[id number] DOB: 1945/08/27 DOA: 01/13/2020 PCP: Glendale Chard, MD   Brief Narrative:  74 year old with a history of DM2, CKD stage IV, Nichols trait, B thalasemia, and prior bariatric surgery who was transferred to [facility] from [facility] where she originally presented 12/21/2019 with complaints of chest pain.  A VQ scan and Dopplers were negative at that facility, and a cardiac work-up revealed only moderate pulmonary hypertension.  Her hospital course was complicated by the sudden onset of encephalopathy leukocytosis and fever.  ID and Neurology were consulted and the patient underwent MRI of the brain and a lumbar puncture, both of which were unrevealing.  EEG was unrevealing.  Her case was further complicated by anemia, thrombocytopenia, and acute kidney injury with consideration being given to possible TTP.  She was treated with 5 rounds of plasmapheresis and steroids without improvement.  Adam TS 13 was normal.  Bone marrow biopsy was performed.  At request of the family she was transferred to [facility].  Work-up after arrival to [facility] has included a negative long-term EEG, negative MRI brain, and negative repeat LP.  She had a repeat course of IVIG x5 days.   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 Lorraine Lax, recommended discontinuing and not tapering Keppra and monitoring closely for seizures. No need to start stimulants at this time.  - Patient has completed 5 days of IVIG, which was started on 01/16/2020  - Neurology consulted and appreciated, has no further recommendations at this time.  Continue to monitor closely having completed IVIG, improvements can take up to 2 to 4 weeks per neurology. - Per neurology, Dr. Arrie Eastern note from 7/22, feels that Wernicke's could be higher on the differential given her acute decline following hospitalization.  Recommend to continue supportive care and follow-up on CSF autoimmune encephalitis panel/paraneoplastic panel.  Palliative care consult. - Patient mental status continues to wax and wane generally improving over the past 72 hours - SLP able to clear patient for PO intake, although it remains limited due to mental status -7/29 AST ALT elevated as below, ammonia remains within normal limits - downtrending appropriately  Elevated AST ALT  -Unclear etiology, normal at intake 3 weeks ago -We will discontinue scheduled Tylenol, unclear if this is primary driving factor or even causative given it was initiated just 24 hours ago -Abdomen benign, downtrending appropriately - continue to follow Lab Results  Component Value  Date   ALT 918 (H) 02/03/2020   AST 265 (H) 02/03/2020   ALKPHOS 149 (H) 02/03/2020   BILITOT 0.6 02/03/2020   Aspiration risk  -Now able to pass swallow eval -NG tube continues to be obstructed, hoping core track team can sort  this out today -if NG tube remains unusable over the weekend we will pull it in hopes to advance patient's p.o. intake. -Outlined the need for family decision on PEG tube versus J-tube placement early next week on Monday as patient's p.o. status improves if she is not able to adequately take p.o. we will need to supplement and the NG tube is not a long-term solution.  Loose stools - Secondary to tube feeding with no evidence of infectious process  Hypernatremia, questionably hypovolemic - Continue half-normal saline and continue to monitor BMP - Previously  Hypovolemic hyponatremic - likely 2/2 NS bolus/fluids  Diabetes mellitus, type II, uncontrolled - Patient normally uses an insulin pump at baseline - Hemoglobin A1c 7.1 (on 01/14/2020) - Continue Levemir, insulin sliding scale and CBG monitoring   Vitamin D deficiency - Vitamin D, 25-hydroxy currently 43.42 -within normal limits   Anemia of chronic disease/ Sickle cell trait / Beta thalassemia - Hemoglobin currently 7.8 (small drop in the past few days, suspect dilutional given IVF) - Patient did receive 1 unit PRBC on 01/17/2020 - Continue to monitor CBC  Chronic kidney disease, stage IV - Baseline creatinine approximately 2.4 per chart review Lab Results  Component Value Date   CREATININE 1.73 (H) 02/03/2020   CREATININE 1.79 (H) 02/02/2020   CREATININE 1.84 (H) 02/01/2020   Hyperkalemia - Resolved with lokelma - Continue to monitor BMP  Essential hypertension - Continue amlodipine, metoprolol - BP stable  Moderate protein calorie malnutrition with dysphagia - Not a candidate for PEG tube given history of gastric bypass  - Will have to consider J-tube per general surgery if long-term tube feeds requirement - s/p Cortrak placement - Currently on glucerna  L1-L5 vertebral body/sacral sclerosis - Noted per Hematology at [facility] -felt to be due to sickle cell trait  Asymptomatic bacteriuria - UA shows  rare bacteria, large leukocytes, >50 WBC - Family concerned given that the urine was dark in color - Urine culture multiple species- suggest recollection - Continue IVF   Goals of care - Palliative care consulted and appreciated, planning on meeting on 02/01/20  DVT Prophylaxis  Heparin- will hold given drop in hemoglobin and place on SCDs  Code Status: Full  Family Communication: None at bedside. Daughter and son via phone.  Disposition Plan:  Status is: Inpatient  Remains inpatient appropriate because:Altered mental status, IV treatments appropriate due to intensity of illness or inability to take PO and Inpatient level of care appropriate due to severity of illness   Dispo: The patient is from: Home              Anticipated d/c is to: SNF              Anticipated d/c date is: > 3 days              Patient currently is not medically stable to d/c.   Consultants Neurology   Procedures  EEG Lumbar puncture Cortrak  Antibiotics   Anti-infectives (From admission, onward)   None      Subjective:   Miranda Roman seen and examined today.  Very limited and markedly delayed speech but does answer simple questions appropriately  Objective:   Vitals:  02/02/20 2340 02/03/20 0326 02/03/20 0500 02/03/20 0729  BP: 127/74 (!) 135/72  (!) 132/106  Pulse: 87 86  88  Resp: _0 Temp: 98.7 F (37.1 C) 98.6 F (37 C)  98.4 F (36.9 C)  TempSrc: Oral Oral  Oral  SpO2: 100% 99%  100%  Weight:   77 kg     Intake/Output Summary (Last 24 hours) at 02/03/2020 0756 Last data filed at 02/03/2020 0646 Gross per 24 hour  Intake --  Output 650 ml  Net -650 ml   Filed Weights   01/30/20 0408 02/02/20 0438 02/03/20 0500  Weight: 78.2 kg 76.6 kg 77 kg   Exam  General: Well developed, well nourished, NAD, minimally interactive and occasionally follows commands, speech in one-word broken sentences and responses  HEENT: NCAT, mucous membranes moist.   Cardiovascular: S1  S2 auscultated, RRR.  Respiratory: Clear to auscultation bilaterally   Abdomen: Soft, nontender, nondistended, + bowel sounds  Extremities: warm dry without cyanosis clubbing or edema  Neuro: Awake and alert, tracking movement, not speaking  Data Reviewed: I have personally reviewed following labs and imaging studies  CBC: Recent Labs  Lab 01/29/20 0424 01/30/20 0320 01/31/20 0314 02/01/20 0300 02/02/20 0229  WBC  --   --   --  7.9 10.1  HGB 8.2* 8.1* 7.8* 8.1* 8.3*  HCT 27.2* 26.8* 26.7* 27.4* 27.6*  MCV  --   --   --  88.1 88.7  PLT  --   --   --  312 322   Basic Metabolic Panel: Recent Labs  Lab 01/30/20 0320 01/31/20 0314 02/01/20 0300 02/02/20 0229 02/03/20 0513  NA 145 146* 147* 147* 148*  K 5.2* 5.0 5.0 4.8 4.4  CL 111 111 113* 114* 116*  CO2 _1 GLUCOSE 92 121* 117* 74 104*  BUN 91* 86* 76* 70* 51*  CREATININE 2.08* 2.00* 1.84* 1.79* 1.73*  CALCIUM 8.1* 7.8* 8.0* 8.2* 8.7*   GFR: Estimated Creatinine Clearance: 30 mL/min (A) (by C-G formula based on SCr of 1.73 mg/dL (H)). Liver Function Tests: Recent Labs  Lab 02/02/20 0229 02/03/20 0513  AST 502* 265*  ALT 1,221* 918*  ALKPHOS 153* 149*  BILITOT 0.4 0.6  PROT 6.6 7.3  ALBUMIN 2.0* 2.1*   No results for input(s): LIPASE, AMYLASE in the last 168 hours. Recent Labs  Lab 02/02/20 0916  AMMONIA 25   Coagulation Profile: No results for input(s): INR, PROTIME in the last 168 hours. Cardiac Enzymes: No results for input(s): CKTOTAL, CKMB, CKMBINDEX, TROPONINI in the last 168 hours. BNP (last 3 results) No results for input(s): PROBNP in the last 8760 hours. HbA1C: No results for input(s): HGBA1C in the last 72 hours. CBG: Recent Labs  Lab 02/02/20 1637 02/02/20 1938 02/02/20 2338 02/03/20 0323 02/03/20 0721  GLUCAP 108* 119* 184* 114* 107*   Lipid Profile: No results for input(s): CHOL, HDL, LDLCALC, TRIG, CHOLHDL, LDLDIRECT in the last 72 hours. Thyroid Function  Tests: No results for input(s): TSH, T4TOTAL, FREET4, T3FREE, THYROIDAB in the last 72 hours. Anemia Panel: No results for input(s): VITAMINB12, FOLATE, FERRITIN, TIBC, IRON, RETICCTPCT in the last 72 hours. Urine analysis:    Component Value Date/Time   COLORURINE YELLOW 01/29/2020 1147   APPEARANCEUR HAZY (A) 01/29/2020 1147   LABSPEC 1.012 01/29/2020 1147   PHURINE 7.0 01/29/2020 1147   GLUCOSEU NEGATIVE 01/29/2020 1147   HGBUR LARGE (A) 01/29/2020 1147   BILIRUBINUR NEGATIVE 01/29/2020 1147  BILIRUBINUR negative 09/28/2019 1556   KETONESUR NEGATIVE 01/29/2020 1147   PROTEINUR 100 (A) 01/29/2020 1147   UROBILINOGEN 0.2 09/28/2019 1556   NITRITE NEGATIVE 01/29/2020 1147   LEUKOCYTESUR LARGE (A) 01/29/2020 1147   Sepsis Labs:  Recent Results (from the past 240 hour(s))  Culture, Urine     Status: Abnormal   Collection Time: 01/29/20  1:31 PM   Specimen: Urine, Clean Catch  Result Value Ref Range Status   Specimen Description URINE, CLEAN CATCH  Final   Special Requests   Final    NONE Performed at [facility], [place] [address] [place], [place] [address]    Culture MULTIPLE SPECIES PRESENT, SUGGEST RECOLLECTION (A)  Final   Report Status 01/30/2020 FINAL  Final  Culture, Urine     Status: Abnormal   Collection Time: 01/31/20  5:17 PM   Specimen: Urine, Clean Catch  Result Value Ref Range Status   Specimen Description URINE, CLEAN CATCH  Final   Special Requests   Final    NONE Performed at [facility], [place] [address], [place], [place] [address]    Culture MULTIPLE SPECIES PRESENT, SUGGEST RECOLLECTION (A)  Final   Report Status 02/02/2020 FINAL  Final    Radiology Studies: No results found.  Scheduled Meds: . amLODipine  10 mg Per Tube Daily  . chlorhexidine  15 mL Mouth Rinse BID  . feeding supplement (ENSURE ENLIVE)  237 mL Oral TID BM  . feeding supplement (GLUCERNA 1.5 CAL)  840 mL Per Tube Q24H  . feeding supplement (PROSource TF)  45 mL Per  Tube TID  . heparin  5,000 Units Subcutaneous Q8H  . insulin aspart  0-15 Units Subcutaneous Q4H  . insulin aspart  4 Units Subcutaneous Q4H  . insulin detemir  12 Units Subcutaneous BID  . lidocaine  1 patch Transdermal Q24H  . mouth rinse  15 mL Mouth Rinse q12n4p  . metoprolol tartrate  100 mg Per Tube BID  . pantoprazole sodium  40 mg Per Tube Daily  . sennosides  5 mL Per Tube BID  . thiamine  100 mg Per Tube Daily  . venlafaxine  25 mg Per Tube BID   Continuous Infusions: . sodium chloride 50 mL/hr at 02/02/20 1435     LOS: 21 days   Time Spent in minutes   40 minutes  Little Ishikawa D.O. on 02/03/2020 at 7:56 AM

## 2020-02-04 LAB — GLUCOSE, CAPILLARY
Glucose-Capillary: 127 mg/dL — ABNORMAL HIGH (ref 70–99)
Glucose-Capillary: 95 mg/dL (ref 70–99)
Glucose-Capillary: 148 mg/dL — ABNORMAL HIGH (ref 70–99)
Glucose-Capillary: 145 mg/dL — ABNORMAL HIGH (ref 70–99)
Glucose-Capillary: 125 mg/dL — ABNORMAL HIGH (ref 70–99)
Glucose-Capillary: 162 mg/dL — ABNORMAL HIGH (ref 70–99)

## 2020-02-04 NOTE — Plan of Care (Signed)
  Problem: Elimination: Goal: Will not experience complications related to bowel motility Outcome: Progressing Goal: Will not experience complications related to urinary retention Outcome: Progressing   Problem: Pain Managment: Goal: General experience of comfort will improve Outcome: Progressing   Problem: Safety: Goal: Ability to remain free from injury will improve Outcome: Progressing

## 2020-02-04 NOTE — Progress Notes (Signed)
PROGRESS NOTE    Miranda Roman  PPI:[id number] DOB: Mar 07, 1946 DOA: 01/13/2020 PCP: Glendale Chard, MD   Brief Narrative:  74 year old with a history of DM2, CKD stage IV, Palmer trait, B thalasemia, and prior bariatric surgery who was transferred to [facility] from [facility] where she originally presented 12/21/2019 with complaints of chest pain.  A VQ scan and Dopplers were negative at that facility, and a cardiac work-up revealed only moderate pulmonary hypertension.  Her hospital course was complicated by the sudden onset of encephalopathy leukocytosis and fever.  ID and Neurology were consulted and the patient underwent MRI of the brain and a lumbar puncture, both of which were unrevealing.  EEG was unrevealing.  Her case was further complicated by anemia, thrombocytopenia, and acute kidney injury with consideration being given to possible TTP.  She was treated with 5 rounds of plasmapheresis and steroids without improvement.  Adam TS 13 was normal.  Bone marrow biopsy was performed.  At request of the family she was transferred to [facility].  Work-up after arrival to [facility] has included a negative long-term EEG, negative MRI brain, and negative repeat LP.  She had a repeat course of IVIG x5 days.   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 Lorraine Lax, recommended discontinuing and not tapering Keppra and monitoring closely for seizures. No need to start stimulants at this time.  - Patient has completed 5 days of IVIG, which was started on 01/16/2020  - Neurology consulted and appreciated, has no further recommendations at this time.  Continue to monitor closely having completed IVIG, improvements can take up to 2 to 4 weeks per neurology. - Per neurology, Dr. Arrie Eastern note from 7/22, feels that Wernicke's could be higher on the differential given her acute decline following hospitalization.  Recommend to continue supportive care and follow-up on CSF autoimmune encephalitis panel/paraneoplastic panel.  Palliative care consult. - Patient mental status continues to wax and wane generally improving over the past 72 hours - SLP able to clear patient for PO intake, although it remains limited due to mental status - Advance diet as tolerated - likely still required PEG/J tube placement given unable to keep up with caloric/hydration needs.  Elevated AST ALT  -Unclear etiology, normal at intake 3 weeks ago -We will discontinue scheduled Tylenol, unclear if this is primary driving factor or even causative given it was initiated just 24 hours ago -Abdomen benign, downtrending appropriately - continue to  follow q72h Lab Results  Component Value Date   ALT 918 (H) 02/03/2020   AST 265 (H) 02/03/2020   ALKPHOS 149 (H) 02/03/2020   BILITOT 0.6 02/03/2020   Moderate protein calorie malnutrition with dysphagia - Not a candidate for PEG tube  given history of gastric bypass  - Will have to consider J-tube per general surgery if long-term tube feeds requirement - s/p Cortrak placement -tube feeds ongoing per dietary/nutrition - Patient has now passed swallow screen with speech, p.o. intake continues to be quite poor and certainly not keeping up with caloric or fluid/hydration needs. Will likely need evaluation for feeding tube placement whether PEG tube versus J-tube will defer to either GI or IR in the next 48 to 72 hours pending clinical course.  Aspiration risk  -Now able to pass swallow eval -NG tube continues to be obstructed, hoping core track team can sort this out today -if NG tube remains unusable over the weekend we will pull it in hopes to advance patient's p.o. intake. -Outlined the need for family decision on PEG tube versus J-tube placement early next week on Monday as patient's p.o. status improves if she is not able to adequately take p.o. we will need to supplement and the NG tube is not a long-term solution.  Loose stools - Secondary to tube feeding with no evidence of infectious process  Hypernatremia, questionably hypovolemic - Continue half-normal saline and continue to monitor BMP - Previously  Hypovolemic hyponatremic - likely 2/2 NS bolus/fluids  Diabetes mellitus, type II, uncontrolled - Patient normally uses an insulin pump at baseline - Hemoglobin A1c 7.1 (on 01/14/2020) - Continue Levemir, insulin sliding scale and CBG monitoring   Vitamin D deficiency - Vitamin D, 25-hydroxy currently 43.42 -within normal limits   Anemia of chronic disease/ Sickle cell trait / Beta thalassemia - Hemoglobin currently 7.8 (small drop in the past few days, suspect dilutional given IVF) - Patient did receive 1 unit PRBC on 01/17/2020 - Continue to monitor CBC  Chronic kidney disease, stage IV - Baseline creatinine approximately 2.4 per chart review Lab Results  Component Value Date   CREATININE 1.73 (H)  02/03/2020   CREATININE 1.79 (H) 02/02/2020   CREATININE 1.84 (H) 02/01/2020   Hyperkalemia - Resolved with lokelma - Continue to monitor BMP  Essential hypertension - Continue amlodipine, metoprolol - BP stable  L1-L5 vertebral body/sacral sclerosis - Noted per Hematology at [facility] -felt to be due to sickle cell trait  Asymptomatic bacteriuria - UA shows rare bacteria, large leukocytes, >50 WBC - Family concerned given that the urine was dark in color - Urine culture multiple species- suggest recollection - Continue IVF   Goals of care - Palliative care consulted and appreciated, planning on meeting on 02/01/20  DVT Prophylaxis  Heparin- will hold given drop in hemoglobin and place on SCDs  Code Status: Full  Family Communication: None at bedside. Daughter and son via phone.  Disposition Plan:  Status is: Inpatient  Remains inpatient appropriate because:Altered mental status, IV treatments appropriate due to intensity of illness or inability to take PO and Inpatient level of care appropriate due to severity of illness   Dispo: The patient is from: Home              Anticipated d/c is to: SNF              Anticipated d/c date is: > 3 days  Patient currently is not medically stable to d/c.   Consultants Neurology   Procedures  EEG Lumbar puncture Cortrak  Antibiotics   Anti-infectives (From admission, onward)   None      Subjective:   Miranda Roman seen and examined today.  Very limited and markedly delayed speech but does answer simple questions appropriately  Objective:   Vitals:   02/03/20 1957 02/03/20 2324 02/04/20 0350 02/04/20 0500  BP: (!) 124/61 (!) 152/72 119/74   Pulse: 94 101 92   Resp: _0 Temp: 98.1 F (36.7 C) 98.4 F (36.9 C) 98.7 F (37.1 C)   TempSrc: Axillary Oral Oral   SpO2: 99% 100% 98%   Weight:    77.8 kg    Intake/Output Summary (Last 24 hours) at 02/04/2020 0803 Last data filed at  02/04/2020 0513 Gross per 24 hour  Intake 150 ml  Output 700 ml  Net -550 ml   Filed Weights   02/02/20 0438 02/03/20 0500 02/04/20 0500  Weight: 76.6 kg 77 kg 77.8 kg   Exam  General: Well developed, well nourished, NAD, minimally interactive and occasionally follows commands, speech in one-word broken sentences and responses  HEENT: NCAT, mucous membranes moist.   Cardiovascular: S1 S2 auscultated, RRR.  Respiratory: Clear to auscultation bilaterally   Abdomen: Soft, nontender, nondistended, + bowel sounds  Extremities: warm dry without cyanosis clubbing or edema  Neuro: Awake and alert, tracking movement, not speaking  Data Reviewed: I have personally reviewed following labs and imaging studies  CBC: Recent Labs  Lab 01/29/20 0424 01/30/20 0320 01/31/20 0314 02/01/20 0300 02/02/20 0229  WBC  --   --   --  7.9 10.1  HGB 8.2* 8.1* 7.8* 8.1* 8.3*  HCT 27.2* 26.8* 26.7* 27.4* 27.6*  MCV  --   --   --  88.1 88.7  PLT  --   --   --  312 026   Basic Metabolic Panel: Recent Labs  Lab 01/30/20 0320 01/31/20 0314 02/01/20 0300 02/02/20 0229 02/03/20 0513  NA 145 146* 147* 147* 148*  K 5.2* 5.0 5.0 4.8 4.4  CL 111 111 113* 114* 116*  CO2 _1 GLUCOSE 92 121* 117* 74 104*  BUN 91* 86* 76* 70* 51*  CREATININE 2.08* 2.00* 1.84* 1.79* 1.73*  CALCIUM 8.1* 7.8* 8.0* 8.2* 8.7*   GFR: Estimated Creatinine Clearance: 30.2 mL/min (A) (by C-G formula based on SCr of 1.73 mg/dL (H)). Liver Function Tests: Recent Labs  Lab 02/02/20 0229 02/03/20 0513  AST 502* 265*  ALT 1,221* 918*  ALKPHOS 153* 149*  BILITOT 0.4 0.6  PROT 6.6 7.3  ALBUMIN 2.0* 2.1*   No results for input(s): LIPASE, AMYLASE in the last 168 hours. Recent Labs  Lab 02/02/20 0916  AMMONIA 25   Coagulation Profile: No results for input(s): INR, PROTIME in the last 168 hours. Cardiac Enzymes: No results for input(s): CKTOTAL, CKMB, CKMBINDEX, TROPONINI in the last 168 hours. BNP  (last 3 results) No results for input(s): PROBNP in the last 8760 hours. HbA1C: No results for input(s): HGBA1C in the last 72 hours. CBG: Recent Labs  Lab 02/03/20 1116 02/03/20 1457 02/03/20 2017 02/03/20 2321 02/04/20 0347  GLUCAP 169* 97 206* 175* 127*   Lipid Profile: No results for input(s): CHOL, HDL, LDLCALC, TRIG, CHOLHDL, LDLDIRECT in the last 72 hours. Thyroid Function Tests: No results for input(s): TSH, T4TOTAL, FREET4, T3FREE, THYROIDAB in the last 72 hours. Anemia Panel: No results  for input(s): VITAMINB12, FOLATE, FERRITIN, TIBC, IRON, RETICCTPCT in the last 72 hours. Urine analysis:    Component Value Date/Time   COLORURINE YELLOW 01/29/2020 1147   APPEARANCEUR HAZY (A) 01/29/2020 1147   LABSPEC 1.012 01/29/2020 1147   PHURINE 7.0 01/29/2020 1147   GLUCOSEU NEGATIVE 01/29/2020 1147   HGBUR LARGE (A) 01/29/2020 1147   BILIRUBINUR NEGATIVE 01/29/2020 1147   BILIRUBINUR negative 09/28/2019 1556   KETONESUR NEGATIVE 01/29/2020 1147   PROTEINUR 100 (A) 01/29/2020 1147   UROBILINOGEN 0.2 09/28/2019 1556   NITRITE NEGATIVE 01/29/2020 1147   LEUKOCYTESUR LARGE (A) 01/29/2020 1147   Sepsis Labs:  Recent Results (from the past 240 hour(s))  Culture, Urine     Status: Abnormal   Collection Time: 01/29/20  1:31 PM   Specimen: Urine, Clean Catch  Result Value Ref Range Status   Specimen Description URINE, CLEAN CATCH  Final   Special Requests   Final    NONE Performed at [facility], [place] [address] [place], [place] [address]    Culture MULTIPLE SPECIES PRESENT, SUGGEST RECOLLECTION (A)  Final   Report Status 01/30/2020 FINAL  Final  Culture, Urine     Status: Abnormal   Collection Time: 01/31/20  5:17 PM   Specimen: Urine, Clean Catch  Result Value Ref Range Status   Specimen Description URINE, CLEAN CATCH  Final   Special Requests   Final    NONE Performed at [facility], [place] [address], [place], [place] [address]    Culture MULTIPLE  SPECIES PRESENT, SUGGEST RECOLLECTION (A)  Final   Report Status 02/02/2020 FINAL  Final    Radiology Studies: No results found.  Scheduled Meds: . amLODipine  10 mg Per Tube Daily  . chlorhexidine  15 mL Mouth Rinse BID  . feeding supplement (ENSURE ENLIVE)  237 mL Oral TID BM  . feeding supplement (GLUCERNA 1.5 CAL)  840 mL Per Tube Q24H  . feeding supplement (PROSource TF)  45 mL Per Tube TID  . heparin  5,000 Units Subcutaneous Q8H  . insulin aspart  0-15 Units Subcutaneous Q4H  . insulin aspart  4 Units Subcutaneous Q4H  . insulin detemir  12 Units Subcutaneous BID  . lidocaine  1 patch Transdermal Q24H  . mouth rinse  15 mL Mouth Rinse q12n4p  . metoprolol tartrate  100 mg Per Tube BID  . pantoprazole sodium  40 mg Per Tube Daily  . sennosides  5 mL Per Tube BID  . thiamine  100 mg Per Tube Daily  . venlafaxine  25 mg Per Tube BID   Continuous Infusions: . sodium chloride 50 mL/hr at 02/03/20 1032     LOS: 22 days   Time Spent in minutes   40 minutes  Little Ishikawa D.O. on 02/04/2020 at 8:03 AM

## 2020-02-05 LAB — COMPREHENSIVE METABOLIC PANEL
ALT: 508 U/L — ABNORMAL HIGH (ref 0–44)
AST: 152 U/L — ABNORMAL HIGH (ref 15–41)
Albumin: 2 g/dL — ABNORMAL LOW (ref 3.5–5.0)
Alkaline Phosphatase: 131 U/L — ABNORMAL HIGH (ref 38–126)
Anion gap: 6 (ref 5–15)
BUN: 65 mg/dL — ABNORMAL HIGH (ref 8–23)
CO2: 24 mmol/L (ref 22–32)
Calcium: 8 mg/dL — ABNORMAL LOW (ref 8.9–10.3)
Chloride: 110 mmol/L (ref 98–111)
Creatinine, Ser: 1.62 mg/dL — ABNORMAL HIGH (ref 0.44–1.00)
GFR calc Af Amer: 36 mL/min — ABNORMAL LOW (ref >60)
GFR calc non Af Amer: 31 mL/min — ABNORMAL LOW (ref >60)
Glucose, Bld: 127 mg/dL — ABNORMAL HIGH (ref 70–99)
Potassium: 5 mmol/L (ref 3.5–5.1)
Sodium: 140 mmol/L (ref 135–145)
Total Bilirubin: 0.2 mg/dL — ABNORMAL LOW (ref 0.3–1.2)
Total Protein: 5.8 g/dL — ABNORMAL LOW (ref 6.5–8.1)

## 2020-02-05 LAB — GLUCOSE, CAPILLARY
Glucose-Capillary: 108 mg/dL — ABNORMAL HIGH (ref 70–99)
Glucose-Capillary: 107 mg/dL — ABNORMAL HIGH (ref 70–99)
Glucose-Capillary: 126 mg/dL — ABNORMAL HIGH (ref 70–99)
Glucose-Capillary: 94 mg/dL (ref 70–99)
Glucose-Capillary: 117 mg/dL — ABNORMAL HIGH (ref 70–99)
Glucose-Capillary: 213 mg/dL — ABNORMAL HIGH (ref 70–99)

## 2020-02-05 LAB — HEMOGLOBIN AND HEMATOCRIT, BLOOD
HCT: 30.8 % — ABNORMAL LOW (ref 36.0–46.0)
Hemoglobin: 9.3 g/dL — ABNORMAL LOW (ref 12.0–15.0)

## 2020-02-05 LAB — CBC
HCT: 25.5 % — ABNORMAL LOW (ref 36.0–46.0)
Hemoglobin: 7.8 g/dL — ABNORMAL LOW (ref 12.0–15.0)
MCH: 27.3 pg (ref 26.0–34.0)
MCHC: 30.6 g/dL (ref 30.0–36.0)
MCV: 89.2 fL (ref 80.0–100.0)
Platelets: 285 10*3/uL (ref 150–400)
RBC: 2.86 MIL/uL — ABNORMAL LOW (ref 3.87–5.11)
RDW: 17.6 % — ABNORMAL HIGH (ref 11.5–15.5)
WBC: 8.1 10*3/uL (ref 4.0–10.5)
nRBC: 0.2 % (ref 0.0–0.2)

## 2020-02-05 NOTE — Plan of Care (Signed)

## 2020-02-05 NOTE — Progress Notes (Signed)
Pt due Heparin SQ; per Dr. Avon Gully, HOLD Heparin d/t Hgb drop, Hgb now 7.8. [place], NP informed.

## 2020-02-05 NOTE — Progress Notes (Signed)
Temperature reassessed as advised by MD. Temperature improved, now 98.4 axillary.

## 2020-02-05 NOTE — Progress Notes (Signed)
Patient's room noted to have thermostat set at 78 degrees and patient had 3 blankets and 1 flat sheet covering her whole body. Temp was assessed with vitals at this time and was noted to bed 100.2. 2 blankets were removed and thermostat was adjusted down to 72. Notified MD is patient's elevated temp, MD not concerned for possible infection agreed with treatment and advised to reassess temp when room is cooler.

## 2020-02-05 NOTE — Progress Notes (Signed)
PROGRESS NOTE    Miranda Roman  IOX:[id number] DOB: Oct 07, 1945 DOA: 01/13/2020 PCP: Glendale Chard, MD   Brief Narrative:  74 year old with a history of DM2, CKD stage IV, [place] trait, B thalasemia, and prior bariatric surgery who was transferred to [facility] from [facility] where she originally presented 12/21/2019 with complaints of chest pain.  A VQ scan and Dopplers were negative at that facility, and a cardiac work-up revealed only moderate pulmonary hypertension.  Her hospital course was complicated by the sudden onset of encephalopathy leukocytosis and fever.  ID and Neurology were consulted and the patient underwent MRI of the brain and a lumbar puncture, both of which were unrevealing.  EEG was unrevealing.  Her case was further complicated by anemia, thrombocytopenia, and acute kidney injury with consideration being given to possible TTP.  She was treated with 5 rounds of plasmapheresis and steroids without improvement.  Adam TS 13 was normal.  Bone marrow biopsy was performed.  At request of the family she was transferred to [facility].  Work-up after arrival to [facility] has included a negative long-term EEG, negative MRI brain, and negative repeat LP.  She had a repeat course of IVIG x5 days.  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 Lorraine Lax, recommended discontinuing and not tapering Keppra and monitoring closely for seizures. No need to start stimulants at this time.  - Patient has completed 5 days of IVIG, which was started on 01/16/2020  - Neurology consulted and appreciated, has no further recommendations at this time.  Continue to monitor closely having completed IVIG, improvements can take up to 2 to 4 weeks per neurology. - Per neurology, Dr. Arrie Eastern note from 7/22, feels that Wernicke's could be higher on the differential given her acute decline following hospitalization.  Recommend to continue supportive care and follow-up on CSF autoimmune encephalitis panel/paraneoplastic panel.  Palliative care consult. - Patient mental status continues to wax and wane generally improving over the past 72 hours - SLP able to clear patient for PO intake, although it remains limited due to mental status - Advance diet as tolerated - likely still required PEG/J tube placement given unable to keep up with caloric/hydration needs - see below  Elevated AST/ALT, resolving  -Unclear etiology, normal at intake 3 weeks ago -We will discontinue scheduled Tylenol, unclear if this is primary driving factor or even causative given it was initiated just 24 hours ago -Abdomen benign, downtrending appropriately -  continue to follow q72h Lab Results  Component Value Date   ALT 508 (H) 02/05/2020   AST 152 (H) 02/05/2020   ALKPHOS 131 (H) 02/05/2020   BILITOT 0.2 (L) 02/05/2020   Moderate protein calorie malnutrition with dysphagia,  improving - Not a candidate for PEG tube given history of gastric bypass. Will have to consider J-tube per general surgery if long-term tube feeds requirement - s/p Cortrak placement -tube feeds ongoing per dietary/nutrition - Patient has now passed swallow screen with speech, p.o. intake continues to be quite poor and certainly not keeping up with caloric or fluid/hydration needs. Will consult with GI and if necessary IR or Gen Sx for feeding tube placement in the next few days; family seems somewhat hesitant, understandably, to have patient undergo procedure given her mental status  Aspiration risk  -Now able to pass swallow eval -NG tube continues to be obstructed, hoping core track team can sort this out today -if NG tube remains unusable over the weekend we will pull it in hopes to advance patient's p.o. intake. -Outlined the need for family decision on PEG tube versus J-tube placement early next week on Monday as patient's p.o. status improves if she is not able to adequately take p.o. we will need to supplement and the NG tube is not a long-term solution.  Loose stools - Secondary to tube feeding with no evidence of infectious process  Hypernatremia, questionably hypovolemic - Continue half-normal saline and continue to monitor BMP - Previously  Hypovolemic hyponatremic - likely 2/2 NS bolus/fluids  Diabetes mellitus, type II, uncontrolled - Patient normally uses an insulin pump at baseline - Hemoglobin A1c 7.1 (on 01/14/2020) - Continue Levemir, insulin sliding scale and CBG monitoring   Vitamin D deficiency - Vitamin D, 25-hydroxy currently 43.42 -within normal limits   Anemia of chronic disease/ Sickle cell trait / Beta thalassemia - Hemoglobin currently 7.8 (small drop in the past few days, suspect dilutional given IVF) - Patient did receive 1 unit PRBC on 01/17/2020 - Continue to monitor CBC  Chronic kidney disease, stage IV - Baseline creatinine approximately 2.4 per  chart review Lab Results  Component Value Date   CREATININE 1.62 (H) 02/05/2020   CREATININE 1.73 (H) 02/03/2020   CREATININE 1.79 (H) 02/02/2020   Hyperkalemia - Resolved with lokelma - Continue to monitor BMP  Essential hypertension - Continue amlodipine, metoprolol - BP stable  L1-L5 vertebral body/sacral sclerosis - Noted per Hematology at [facility] -felt to be due to sickle cell trait  Asymptomatic bacteriuria - UA shows rare bacteria, large leukocytes, >50 WBC - Family concerned given that the urine was dark in color - Urine culture multiple species- suggest recollection - Continue IVF   Goals of care - Palliative care consulted and appreciated, planning on meeting on 02/01/20  DVT Prophylaxis  Heparin- will hold given drop in hemoglobin and place on SCDs  Code Status: Full  Family Communication: None at bedside. Daughter and son via phone.  Disposition Plan:  Status is: Inpatient  Remains inpatient appropriate because:Altered mental status, IV treatments appropriate due to intensity of illness or inability to take PO and Inpatient level of care appropriate due to severity of illness   Dispo: The patient is from: Home              Anticipated d/c is to: SNF              Anticipated d/c date is: > 3 days  Patient currently is not medically stable to d/c.   Consultants Neurology   Procedures  EEG Lumbar puncture Cortrak  Antibiotics   Anti-infectives (From admission, onward)   None      Subjective:   [name] seen and examined today.  Very limited and markedly delayed speech but does answer simple questions appropriately  Objective:   Vitals:   02/05/20 0426 02/05/20 0713 02/05/20 0926 02/05/20 1217  BP: 128/71  (!) 137/69 (!) 120/59  Pulse: 90  100 89  Resp: _0 Temp: 99.3 F (37.4 C)  99.4 F (37.4 C) (!) 100.5 F (38.1 C)  TempSrc: Oral  Oral Axillary  SpO2: 100%  100% 100%  Weight:  79.2 kg       Intake/Output Summary (Last 24 hours) at 02/05/2020 1408 Last data filed at 02/05/2020 0944 Gross per 24 hour  Intake 6382 ml  Output 2175 ml  Net 4207 ml   Filed Weights   02/04/20 0500 02/05/20 0321 02/05/20 0713  Weight: 77.8 kg 79.4 kg 79.2 kg   Exam  General: Well developed, well nourished, NAD, minimally interactive and occasionally follows commands, speech in one-word broken sentences and responses  HEENT: NCAT, mucous membranes moist.   Cardiovascular: S1 S2 auscultated, RRR.  Respiratory: Clear to auscultation bilaterally   Abdomen: Soft, nontender, nondistended, + bowel sounds  Extremities: warm dry without cyanosis clubbing or edema  Neuro: Awake and alert, tracking movement, not speaking  Data Reviewed: I have personally reviewed following labs and imaging studies  CBC: Recent Labs  Lab 01/31/20 0314 02/01/20 0300 02/02/20 0229 02/05/20 0232 02/05/20 1019  WBC  --  7.9 10.1 8.1  --   HGB 7.8* 8.1* 8.3* 7.8* 9.3*  HCT 26.7* 27.4* 27.6* 25.5* 30.8*  MCV  --  88.1 88.7 89.2  --   PLT  --  312 308 285  --    Basic Metabolic Panel: Recent Labs  Lab 01/31/20 0314 02/01/20 0300 02/02/20 0229 02/03/20 0513 02/05/20 0232  NA 146* 147* 147* 148* 140  K 5.0 5.0 4.8 4.4 5.0  CL 111 113* 114* 116* 110  CO2 _1 GLUCOSE 121* 117* 74 104* 127*  BUN 86* 76* 70* 51* 65*  CREATININE 2.00* 1.84* 1.79* 1.73* 1.62*  CALCIUM 7.8* 8.0* 8.2* 8.7* 8.0*   GFR: Estimated Creatinine Clearance: 32.5 mL/min (A) (by C-G formula based on SCr of 1.62 mg/dL (H)). Liver Function Tests: Recent Labs  Lab 02/02/20 0229 02/03/20 0513 02/05/20 0232  AST 502* 265* 152*  ALT 1,221* 918* 508*  ALKPHOS 153* 149* 131*  BILITOT 0.4 0.6 0.2*  PROT 6.6 7.3 5.8*  ALBUMIN 2.0* 2.1* 2.0*   No results for input(s): LIPASE, AMYLASE in the last 168 hours. Recent Labs  Lab 02/02/20 0916  AMMONIA 25   Coagulation Profile: No results for input(s): INR, PROTIME in the  last 168 hours. Cardiac Enzymes: No results for input(s): CKTOTAL, CKMB, CKMBINDEX, TROPONINI in the last 168 hours. BNP (last 3 results) No results for input(s): PROBNP in the last 8760 hours. HbA1C: No results for input(s): HGBA1C in the last 72 hours. CBG: Recent Labs  Lab 02/04/20 1914 02/04/20 2258 02/05/20 0316 02/05/20 0736 02/05/20 1221  GLUCAP 125* 162* 108* 107* 126*   Lipid Profile: No results for input(s): CHOL, HDL, LDLCALC, TRIG, CHOLHDL, LDLDIRECT in the last 72 hours. Thyroid Function Tests: No results for input(s): TSH, T4TOTAL, FREET4, T3FREE, THYROIDAB in the last 72 hours. Anemia  Panel: No results for input(s): VITAMINB12, FOLATE, FERRITIN, TIBC, IRON, RETICCTPCT in the last 72 hours. Urine analysis:    Component Value Date/Time   COLORURINE YELLOW 01/29/2020 1147   APPEARANCEUR HAZY (A) 01/29/2020 1147   LABSPEC 1.012 01/29/2020 1147   PHURINE 7.0 01/29/2020 1147   GLUCOSEU NEGATIVE 01/29/2020 1147   HGBUR LARGE (A) 01/29/2020 1147   BILIRUBINUR NEGATIVE 01/29/2020 1147   BILIRUBINUR negative 09/28/2019 1556   KETONESUR NEGATIVE 01/29/2020 1147   PROTEINUR 100 (A) 01/29/2020 1147   UROBILINOGEN 0.2 09/28/2019 1556   NITRITE NEGATIVE 01/29/2020 1147   LEUKOCYTESUR LARGE (A) 01/29/2020 1147   Sepsis Labs:  Recent Results (from the past 240 hour(s))  Culture, Urine     Status: Abnormal   Collection Time: 01/29/20  1:31 PM   Specimen: Urine, Clean Catch  Result Value Ref Range Status   Specimen Description URINE, CLEAN CATCH  Final   Special Requests   Final    NONE Performed at [facility], [place] [address], [place], [place] [address]    Culture MULTIPLE SPECIES PRESENT, SUGGEST RECOLLECTION (A)  Final   Report Status 01/30/2020 FINAL  Final  Culture, Urine     Status: Abnormal   Collection Time: 01/31/20  5:17 PM   Specimen: Urine, Clean Catch  Result Value Ref Range Status   Specimen Description URINE, CLEAN CATCH  Final   Special  Requests   Final    NONE Performed at [facility], [place] [address], [place], [place] [address]    Culture MULTIPLE SPECIES PRESENT, SUGGEST RECOLLECTION (A)  Final   Report Status 02/02/2020 FINAL  Final    Radiology Studies: No results found.  Scheduled Meds: . amLODipine  10 mg Per Tube Daily  . chlorhexidine  15 mL Mouth Rinse BID  . feeding supplement (ENSURE ENLIVE)  237 mL Oral TID BM  . feeding supplement (GLUCERNA 1.5 CAL)  840 mL Per Tube Q24H  . feeding supplement (PROSource TF)  45 mL Per Tube TID  . heparin  5,000 Units Subcutaneous Q8H  . insulin aspart  0-15 Units Subcutaneous Q4H  . insulin aspart  4 Units Subcutaneous Q4H  . insulin detemir  12 Units Subcutaneous BID  . lidocaine  1 patch Transdermal Q24H  . mouth rinse  15 mL Mouth Rinse q12n4p  . metoprolol tartrate  100 mg Per Tube BID  . pantoprazole sodium  40 mg Per Tube Daily  . sennosides  5 mL Per Tube BID  . thiamine  100 mg Per Tube Daily  . venlafaxine  25 mg Per Tube BID   Continuous Infusions: . sodium chloride 50 mL/hr at 02/05/20 0324     LOS: 23 days   Time Spent in minutes   40 minutes  Little Ishikawa D.O. on 02/05/2020 at 2:08 PM

## 2020-02-05 NOTE — Plan of Care (Signed)
  Problem: Clinical Measurements: Goal: Will remain free from infection Outcome: Progressing   Problem: Activity: Goal: Risk for activity intolerance will decrease Outcome: Progressing   Problem: Coping: Goal: Level of anxiety will decrease Outcome: Progressing   Problem: Pain Managment: Goal: General experience of comfort will improve Outcome: Progressing

## 2020-02-05 NOTE — Progress Notes (Signed)
Daily Progress Note   Patient Name: Miranda Roman       Date: 02/05/2020 DOB: 06-Nov-1945  Age: 74 y.o. MRN#: [id number] Attending Physician: Little Ishikawa, MD Primary Care Physician: Glendale Chard, MD Admit Date: 01/13/2020  Reason for Consultation/Follow-up: continued GOC discussion  HPI/Patient Profile:73 y.o.femalewith past medical history of DM type 2, hypertension, chronic kidney disease stage IV, B thalasemia, and prior bariatric surgery. She was transferred to [facility] from [facility] 7/9/2021with encephalopathy of unclear origin.Patient was admitted to [facility] 12/20/19 while traveling with initial presentation of chest pain. Cardiac work-up revealed only moderate pulmonary hypertension. Her hospital course was complicated by sudden onset of AMS, fever, and leukocytosis. ID and neurology were consulted and the patient underwent EEG, MRI of the brain, and a lumbar puncture, all of which were unrevealing. Her case was further complicated by anemia, thrombocytopenia, and acute kidney injury with consideration of possible TTP. Bone marrow biopsy was done. She was treated with 5 rounds of plasmapheresis and steroids without improvement. At the request of the family, patient was transferred to [facility]. Work-up at [facility] included negative continuous EEG, negative MRI brain, and negative repeat LP. She also had a repeat course of IVIG x 5 days.  Subjective: Patient is alert, tracks to voice. Does not follow commands and is non-verbal during my assessment. Daughter Miranda Roman and son Miranda Roman are at bedside. Miranda Roman shares that he has been in the room since early this morning, and patient has spoken 5 different times. Bedside RN had also shared with me outside the room that patient would  intermittently respond "yes" or "no" appropriately to simple questions.   We discussed that patient had passed her swallow evaluation 7/30. Current plan of care is to encourage po intake during the day, and tube feeding at night. Discussed that even though po intake is minimal right now, the fact that she passed the swallow evaluation is positive.  Miranda Roman shares that she thinks the venlafaxine has helped with the possible underlying back pain. She reports that her mom does not cry out anymore when staff repositions her.    Family is encouraged by the signs of improvement the patient has shown since last week. The overall goal of care is to get her back home and as close as possible to her former level of functioning.  Family knows this will be a day by day process. Miranda Roman states "this is a marathon, not a sprint".    Length of Stay: 23   Physical Exam Constitutional:      General: She is not in acute distress. Pulmonary:     Effort: Pulmonary effort is normal.  Neurological:     Mental Status: She is alert.             Vital Signs: BP (!) 120/59 (BP Location: Left Arm)   Pulse 89   Temp (!) 100.5 F (38.1 C) (Axillary)   Resp 20   Wt 79.2 kg   SpO2 100%   BMI 29.22 kg/m  SpO2: SpO2: 100 % O2 Device: O2 Device: Room Air O2 Flow Rate:    LBM: Last BM Date: 02/04/20 Baseline Weight: Weight: 78.3 kg Most recent weight: Weight: 79.2 kg       Palliative Assessment/Data: 20-30%       Palliative Care Assessment & Plan   Assessment: Acute multifactorial encephalopathy--autoimmuneversus Wernicke Encephalopathy. Per neurology, Wernicke Encephalopathy is the more likely differential at this point.   Recommendations/Plan: - Full scope treatment, with ongoing discussions - Family remains hopeful for continued improvement - Continue Venlafaxine 25 mg BID per tube (for possible acute on chronic back pain) - Palliative Medicine will continue to follow  Code Status: Full  code  Prognosis:  Unable to determine  Discharge Planning: To Be Determined  Care plan was discussed with bedside RN  Thank you for allowing the Palliative Medicine Team to assist in the care of this patient.   Total Time 35 minutes Prolonged Time Billed  no       Greater than 50%  of this time was spent counseling and coordinating care related to the above assessment and plan.  Lavena Bullion, NP  Please contact Palliative Medicine Team phone at [phone] for questions and concerns.

## 2020-02-06 LAB — GLUCOSE, CAPILLARY
Glucose-Capillary: 140 mg/dL — ABNORMAL HIGH (ref 70–99)
Glucose-Capillary: 105 mg/dL — ABNORMAL HIGH (ref 70–99)
Glucose-Capillary: 123 mg/dL — ABNORMAL HIGH (ref 70–99)
Glucose-Capillary: 74 mg/dL (ref 70–99)
Glucose-Capillary: 136 mg/dL — ABNORMAL HIGH (ref 70–99)
Glucose-Capillary: 135 mg/dL — ABNORMAL HIGH (ref 70–99)

## 2020-02-06 MED ORDER — CHLORHEXIDINE GLUCONATE CLOTH 2 % EX PADS
6.0000 | MEDICATED_PAD | Freq: Once | CUTANEOUS | Status: AC
  Administered 2020-02-07: 6 via TOPICAL

## 2020-02-06 NOTE — Progress Notes (Signed)
Occupational Therapy Treatment Patient Details Name: Miranda Roman MRN: [id number] DOB: 1946-06-12 Today's Date: 02/06/2020    History of present illness Patient is a 74 year old female. 3 weeks prior she injured her foot tubing on vacation. The next day she began to have chest pain and quickly developed acute encephalopathy. She was flown from [place] (on vacation) to [place] where she lives. Workups negative with neuro looking at multifactorial encephalopathy-likely autoimmune   OT comments  Upon arrival, pt up in bed and daughter present in room - agreeable to skilled-OT session. Pt more alert and awake today and vocalized "hey" when therapists walked into the room. Pt needing total A +2 to scoot up in bed and position bed in chair position. Completed PROM with pt and adjusted and re-fitted washed resting hand splints that pt's daughter had brought back from home. Pt complete washing face sitting up in chair position with total A with hand over hand assist. Readjusted pt's boots and pt expressed pain in grimacing while flexing hips to adjust boots. Pt able to respond to some yes or no questions, but answers inconsistently. Pt left in bed with additional pillows for positioning BUE's with daughter in room with bed alarm set and call bell/phone within reach. Believe recommendations are still appropriate. Will continue to follow acutely.   Follow Up Recommendations  LTACH    Equipment Recommendations  None recommended by OT       Precautions / Restrictions Precautions Precautions: Fall       Mobility Bed Mobility Overal bed mobility: Needs Assistance             General bed mobility comments: Total A +2 to scoot pt up in bed prior to placing bed in chair position.  Transfers                 General transfer comment: unable to safely attempt     Balance Overall balance assessment: Needs assistance Sitting-balance support: No upper extremity supported;Feet  supported Sitting balance-Leahy Scale: Zero Sitting balance - Comments: Pt required max to total A to maintain unsupported sitting with bed in chair position Postural control: Right lateral lean                                 ADL either performed or assessed with clinical judgement   ADL Overall ADL's : Needs assistance/impaired     Grooming: Wash/dry face;Bed level;Total assistance Grooming Details (indicate cue type and reason): wash dry face at bed level with Total A HOH             Lower Body Dressing: Total assistance;Bed level Lower Body Dressing Details (indicate cue type and reason): Total A to don boots             Functional mobility during ADLs: Total assistance;+2 for physical assistance General ADL Comments: session focused on BUE PROM and bed level grooming with command following               Cognition Arousal/Alertness: Awake/alert Behavior During Therapy: Flat affect Overall Cognitive Status: Impaired/Different from baseline Area of Impairment: Following commands;Attention;Problem solving                   Current Attention Level: Focused   Following Commands: Follows one step commands inconsistently     Problem Solving: Slow processing;Difficulty sequencing;Decreased initiation;Requires verbal cues;Requires tactile cues General Comments: Pt more alert today - able  to vocalize "hey" when therapists entered the room. Pt able to answer some yes or no questions.        Exercises General Exercises - Upper Extremity Shoulder Flexion: PROM;10 reps;Supine;Both Shoulder Extension: Both;PROM;10 reps;Supine Elbow Flexion: PROM;Both;Supine;10 reps Elbow Extension: PROM;Both;Supine;10 reps Wrist Flexion: PROM;Both;Supine;10 reps Wrist Extension: PROM;Both;Supine;10 reps Digit Composite Flexion: PROM;Both;Supine;10 reps Composite Extension: PROM;Both;Supine;10 reps Hand Exercises Forearm Supination: PROM;Both;Supine;10  reps Forearm Pronation: PROM;Both;Supine;10 reps      General Comments Pt's daughter present for treatment and was educated on cleaning and placing resting hand splints on pt.    Pertinent Vitals/ Pain       Pain Assessment: Faces Faces Pain Scale: Hurts even more Pain Location: grimacing with R hip flexion  Pain Descriptors / Indicators: Grimacing;Discomfort Pain Intervention(s): Limited activity within patient's tolerance;Monitored during session;Repositioned         Frequency  Min 2X/week        Progress Toward Goals  OT Goals(current goals can now be found in the care plan section)  Progress towards OT goals: Not progressing toward goals - comment (not following commands)  Acute Rehab OT Goals Patient Stated Goal: pt unable to state.  Family is hoping that she will improve and be able to engage and be as independent as possible  OT Goal Formulation: With patient/family Time For Goal Achievement: 02/20/20 Potential to Achieve Goals: [place] Discharge plan remains appropriate;Frequency remains appropriate    Co-evaluation      Reason for Co-Treatment: Complexity of the patient's impairments (multi-system involvement);Necessary to address cognition/behavior during functional activity             End of Session    OT Visit Diagnosis: Cognitive communication deficit (R41.841)   Activity Tolerance Patient tolerated treatment well   Patient Left in bed;with call bell/phone within reach;with bed alarm set;with family/visitor present   Nurse Communication Mobility status        Time: 3662-9476 OT Time Calculation (min): 28 min  Charges: OT General Charges $OT Visit: 1 Visit OT Treatments $Self Care/Home Management : 23-37 mins  Kal Chait/OTS   Sayde Lish 02/06/2020, 3:56 PM

## 2020-02-06 NOTE — TOC Progression Note (Signed)
Transition of Care [facility]) - Progression Note    Patient Details  Name: [name] MRN: [id number] Date of Birth: 03/04/1946  Transition of Care [facility] CM/SW [place], [place] Phone Number: 02/06/2020, 3:01 PM  Clinical Narrative:   CSW contacted [facility] to review referral, as that is the family's first choice. [facility] unable to accept. CSW met with daughter at bedside and updated her, she will have to review other options and get back to CSW. CSW to follow.    Expected Discharge Plan: Skilled Nursing Facility Barriers to Discharge: Ship broker, Continued Medical Work up  Expected Discharge Plan and Services Expected Discharge Plan: [place] Choice: [place] arrangements for the past 2 months: Single Family Home                                       Social Determinants of Health (SDOH) Interventions    Readmission Risk Interventions No flowsheet data found.

## 2020-02-06 NOTE — Plan of Care (Signed)

## 2020-02-06 NOTE — Consult Note (Addendum)
Miranda Roman 1945-07-20  [id number].    Requesting MD: Dr. Avon Gully Chief Complaint/Reason for Consult: Feeding tube placement   HPI: Miranda Roman is a 74 y.o. female with a history HTN, HLD, DM2 who we were asked to see for feeding tube placement. 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 Lucia Gaskins.   History obtained from chart review.  Patient apparently presented to [facility] (was visiting [facility] on vacation) on 6/16 with chest pain.  She was admitted for further work-up.  Her hospital stay was complicated by sudden onset of encephalopathy, leukocytosis and fever.  Etiology of encephalopathy has remained unclear and work-up has been negative thus far.  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 Patient is able to answer orientation questions x 3 for me but cannot answer any further questions during my history and exam.   Patient passed for a dysphagia 1 diet on 7/29.  Patient has had poor p.o. intake since passing for diet.  She is currently on nocturnal tube feeds via cortrak. Calorie count pending. Albumin 2.0. We were asked to see.  ROS: Review of Systems  Unable to perform ROS: Other  Encephalopathy  Family History  Problem Relation Age of Onset  . Diabetes Mother   . Heart disease Mother   . Diabetes Father   . Heart disease Father   . Diabetes Sister   . Diabetes Maternal Aunt   . Diabetes Maternal Uncle   . Diabetes Maternal Grandmother   . Diabetes Maternal Grandfather     Past Medical History:  Diagnosis Date  . Anemia   . Diabetes mellitus   . Hyperlipidemia   . Hypertension   . Osteoporosis   . Sickle cell anemia (HCC)     Past Surgical History:  Procedure Laterality Date  . BONE RESECTION  01/2006   wrist  . 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 TUBAL LIGATION  04/28/1977    Social History:  reports that she quit smoking about 14 years ago. Her smoking use included cigarettes. She has a 10.00 pack-year  smoking history. She has never used smokeless tobacco. She reports previous alcohol use. She reports that she does not use drugs.  Allergies:  Allergies  Allergen Reactions  . Cefepime Other (See Comments)    Causes encephalitis - reported by [facility] 12/31/2019  . Lactose Intolerance (Gi)     Per pt's daughter causes gas    Medications Prior to Admission  Medication Sig Dispense Refill  . insulin lispro (HUMALOG) 100 UNIT/ML injection Inject 0.04 mLs (4 Units total) into the skin 3 (three) times daily before meals. Sliding scale (Patient taking differently: Inject 0-16 Units into the skin See admin instructions. Inject 0-16 units subcutaneously every 4 hours per sliding scale: CBG 60-110 0 units, 111-150 4 units, 151-200 8 units, 201-250 10 units, 251-300 12 units, 301-350 14 units, >350 16 units) 10 mL 2  . LEVEMIR FLEXTOUCH 100 UNIT/ML Pen INJECT SUBCUTANEOUSLY 16  UNITS DAILY (Patient taking differently: Inject 5 Units into the skin 2 (two) times daily. ) 15 mL 3  . levETIRAcetam (KEPPRA) 500 MG/5ML injection Inject 500 mg into the vein every 12 (twelve) hours.    . metoprolol tartrate (LOPRESSOR) 50 MG tablet Take 50 mg by mouth 2 (two) times daily.    [name] DEXILANT 60 MG capsule TAKE 1 CAPSULE BY MOUTH  DAILY (Patient not taking: Reported on 01/13/2020) 30 capsule 11  . glucose blood (ONETOUCH VERIO) test  strip Use as instructed to check blood sugars 1 time per day e11.22 100 each 11  . loratadine (CLARITIN) 10 MG tablet Take 1 tablet (10 mg total) by mouth daily. 30 tablet 2  . ONETOUCH DELICA LANCETS 60Y MISC 1 each by Does not apply route daily. DX: E11.65 150 each 11     Physical Exam: Blood pressure 135/68, pulse 89, temperature 98.4 F (36.9 C), temperature source Oral, resp. rate 19, weight 79 kg, SpO2 100 %. General: pleasant, WD/WN female who is laying in bed in NAD HEENT: head is normocephalic, atraumatic.  Sclera are noninjected.  PERRL.  Ears and nose without any masses  or lesions.  Mouth is pink and moist. Dentition fair Neck: Supple. Trachea midline. No gross thyromegaly. No stridor.  Heart: regular, rate, and rhythm.  Normal s1,s2. No obvious murmurs, gallops, or rubs noted.  Palpable pedal pulses bilaterally  Lungs: CTAB, no wheezes, rhonchi, or rales noted.  Respiratory effort nonlabored TKZ:SWFU, NT/ND, +BS, no masses, hernias, or organomegaly. Prior surgical scars are well healed  MS: no BUE/BLE edema, calves soft and nontender. Unna boots in place on lower extremities.  Skin: warm and dry with no masses, lesions, or rashes Psych: A&Ox3 with flat affect Neuro: Moves all extremities. Unable to assess gait. Unable to answer questions. Can follow some one step commands (squeeze hands, wiggle toes etc).    Results for orders placed or performed during the hospital encounter of 01/13/20 (from the past 48 hour(s))  Glucose, capillary     Status: Abnormal   Collection Time: 02/04/20  7:14 PM  Result Value Ref Range   Glucose-Capillary 125 (H) 70 - 99 mg/dL    Comment: Glucose reference range applies only to samples taken after fasting for at least 8 hours.   Comment 1 Notify RN    Comment 2 Document in Chart   Glucose, capillary     Status: Abnormal   Collection Time: 02/04/20 10:58 PM  Result Value Ref Range   Glucose-Capillary 162 (H) 70 - 99 mg/dL    Comment: Glucose reference range applies only to samples taken after fasting for at least 8 hours.   Comment 1 Notify RN    Comment 2 Document in Chart   CBC     Status: Abnormal   Collection Time: 02/05/20  2:32 AM  Result Value Ref Range   WBC 8.1 4.0 - 10.5 K/uL   RBC 2.86 (L) 3.87 - 5.11 MIL/uL   Hemoglobin 7.8 (L) 12.0 - 15.0 g/dL   HCT 25.5 (L) 36 - 46 %   MCV 89.2 80.0 - 100.0 fL   MCH 27.3 26.0 - 34.0 pg   MCHC 30.6 30.0 - 36.0 g/dL   RDW 17.6 (H) 11.5 - 15.5 %   Platelets 285 150 - 400 K/uL   nRBC 0.2 0.0 - 0.2 %    Comment: Performed at [facility], [address],  [place], [place] [address]  Comprehensive metabolic panel     Status: Abnormal   Collection Time: 02/05/20  2:32 AM  Result Value Ref Range   Sodium 140 135 - 145 mmol/L   Potassium 5.0 3.5 - 5.1 mmol/L   Chloride 110 98 - 111 mmol/L   CO2 24 22 - 32 mmol/L   Glucose, Bld 127 (H) 70 - 99 mg/dL    Comment: Glucose reference range applies only to samples taken after fasting for at least 8 hours.   BUN 65 (H) 8 - 23 mg/dL  Creatinine, Ser 1.62 (H) 0.44 - 1.00 mg/dL   Calcium 8.0 (L) 8.9 - 10.3 mg/dL   Total Protein 5.8 (L) 6.5 - 8.1 g/dL   Albumin 2.0 (L) 3.5 - 5.0 g/dL   AST 152 (H) 15 - 41 U/L   ALT 508 (H) 0 - 44 U/L   Alkaline Phosphatase 131 (H) 38 - 126 U/L   Total Bilirubin 0.2 (L) 0.3 - 1.2 mg/dL   GFR calc non Af Amer 31 (L) >60 mL/min   GFR calc Af Amer 36 (L) >60 mL/min   Anion gap 6 5 - 15    Comment: Performed at [facility] [address] [place], [place] [address]  Glucose, capillary     Status: Abnormal   Collection Time: 02/05/20  3:16 AM  Result Value Ref Range   Glucose-Capillary 108 (H) 70 - 99 mg/dL    Comment: Glucose reference range applies only to samples taken after fasting for at least 8 hours.  Glucose, capillary     Status: Abnormal   Collection Time: 02/05/20  7:36 AM  Result Value Ref Range   Glucose-Capillary 107 (H) 70 - 99 mg/dL    Comment: Glucose reference range applies only to samples taken after fasting for at least 8 hours.  Hemoglobin and hematocrit, blood     Status: Abnormal   Collection Time: 02/05/20 10:19 AM  Result Value Ref Range   Hemoglobin 9.3 (L) 12.0 - 15.0 g/dL   HCT 30.8 (L) 36 - 46 %    Comment: Performed at [facility] [address], [place], [place] [address]  Glucose, capillary     Status: Abnormal   Collection Time: 02/05/20 12:21 PM  Result Value Ref Range   Glucose-Capillary 126 (H) 70 - 99 mg/dL    Comment: Glucose reference range applies only to samples taken after fasting for at least 8 hours.    Comment 1 Notify RN    Comment 2 Document in Chart   Glucose, capillary     Status: None   Collection Time: 02/05/20  3:34 PM  Result Value Ref Range   Glucose-Capillary 94 70 - 99 mg/dL    Comment: Glucose reference range applies only to samples taken after fasting for at least 8 hours.   Comment 1 Notify RN    Comment 2 Document in Chart   Glucose, capillary     Status: Abnormal   Collection Time: 02/05/20  8:30 PM  Result Value Ref Range   Glucose-Capillary 117 (H) 70 - 99 mg/dL    Comment: Glucose reference range applies only to samples taken after fasting for at least 8 hours.   Comment 1 Notify RN    Comment 2 Document in Chart   Glucose, capillary     Status: Abnormal   Collection Time: 02/05/20 11:15 PM  Result Value Ref Range   Glucose-Capillary 213 (H) 70 - 99 mg/dL    Comment: Glucose reference range applies only to samples taken after fasting for at least 8 hours.   Comment 1 Notify RN    Comment 2 Document in Chart   Glucose, capillary     Status: Abnormal   Collection Time: 02/06/20  3:41 AM  Result Value Ref Range   Glucose-Capillary 140 (H) 70 - 99 mg/dL    Comment: Glucose reference range applies only to samples taken after fasting for at least 8 hours.   Comment 1 Notify RN    Comment 2 Document in Chart  Glucose, capillary     Status: Abnormal   Collection Time: 02/06/20  7:42 AM  Result Value Ref Range   Glucose-Capillary 105 (H) 70 - 99 mg/dL    Comment: Glucose reference range applies only to samples taken after fasting for at least 8 hours.  Glucose, capillary     Status: Abnormal   Collection Time: 02/06/20 11:34 AM  Result Value Ref Range   Glucose-Capillary 123 (H) 70 - 99 mg/dL    Comment: Glucose reference range applies only to samples taken after fasting for at least 8 hours.   No results found.    Assessment/Plan HTN HLD DM2   Encephalopathy of unknown etiology Malnutrition  Dysphagia  Hx of Laparoscopic RYGB on 08/14/2009 by Dr.  Lucia Gaskins - Patient passed for a dysphagia 1 diet on 7/29.  Patient has had poor p.o. intake since passing for diet.  She is currently on nocturnal tube feeds via cortrak. Calorie count pending. Albumin 2.0.  - Please stop TF"s. NPO at midnight.  - Vitamin B1 level on 7/10 was 403.4. I do not see a vitamin B1/Thiamine level measured at [facility] in care everywhere. Continue Thiamine supplementation.  - Will discuss with Dr. Redmond Pulling, options for surgery and then reach out to family to obtain consent.   Jillyn Ledger, [facility] 02/06/2020, 3:27 PM Please see Amion for pager number during day hours 7:00am-4:30pm

## 2020-02-06 NOTE — Progress Notes (Signed)
  Speech Language Pathology Treatment: Dysphagia;Cognitive-Linquistic  Patient Details Name: Miranda Roman MRN: [id number] DOB: 10/20/1945 Today's Date: 02/06/2020 Time: 1206-1226 SLP Time Calculation (min) (ACUTE ONLY): 20 min  Assessment / Plan / Recommendation Clinical Impression  Pt alert, sitting up in bed with her daughter, [name], at bedside.  Pt said "hello" when cued by [name].  Efforts made to elicit verbal communication using automatic sequences, singing, humming, moving hand in rhythm with output.  Unable to initiate voice during these attempts despite max cues.  Pt did drink most of her Ensure from a straw with good attention and brisk swallow, requiring hand-over-hand assist to hold cup and bring straw to lips. There were no concerns for aspiration.  Recommend encouraging PO intake; pt currently on nocturnal cortrak feeding.   SLP will continue to follow for communication/swallowing, impacted by deficits in initiation.  D/W [name].    HPI HPI: Patient is a 74 year old female. 3 weeks prior she injured her foot tubing on vacation. The next day she began to have chest pain and quickly developed acute encephalopathy. She was flown from [place] (on vacation) to [place] where she lives. 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      SLP Plan  Continue with current plan of care       Recommendations  Diet recommendations: Dysphagia 1 (puree);Thin liquid Liquids provided via: Straw;Cup Medication Administration: Crushed with puree Supervision: Staff to assist with self feeding Compensations: Small sips/bites;Slow rate                Oral Care Recommendations: Oral care BID SLP Visit Diagnosis: Cognitive communication deficit (B79.499) Plan: Continue with current plan of care       GO                [name] 02/06/2020, 1:24 PM  Daelyn Mozer L.  Tivis Ringer, [place] Office number [phone] Pager [phone]

## 2020-02-06 NOTE — Progress Notes (Signed)
PROGRESS NOTE    Miranda Roman  IOX:[id number] DOB: Oct 07, 1945 DOA: 01/13/2020 PCP: Glendale Chard, MD   Brief Narrative:  74 year old with a history of DM2, CKD stage IV, [place] trait, B thalasemia, and prior bariatric surgery who was transferred to [facility] from [facility] where she originally presented 12/21/2019 with complaints of chest pain.  A VQ scan and Dopplers were negative at that facility, and a cardiac work-up revealed only moderate pulmonary hypertension.  Her hospital course was complicated by the sudden onset of encephalopathy leukocytosis and fever.  ID and Neurology were consulted and the patient underwent MRI of the brain and a lumbar puncture, both of which were unrevealing.  EEG was unrevealing.  Her case was further complicated by anemia, thrombocytopenia, and acute kidney injury with consideration being given to possible TTP.  She was treated with 5 rounds of plasmapheresis and steroids without improvement.  Adam TS 13 was normal.  Bone marrow biopsy was performed.  At request of the family she was transferred to [facility].  Work-up after arrival to [facility] has included a negative long-term EEG, negative MRI brain, and negative repeat LP.  She had a repeat course of IVIG x5 days.  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 Lorraine Lax, recommended discontinuing and not tapering Keppra and monitoring closely for seizures. No need to start stimulants at this time.  - Patient has completed 5 days of IVIG, which was started on 01/16/2020  - Neurology consulted and appreciated, has no further recommendations at this time.  Continue to monitor closely having completed IVIG, improvements can take up to 2 to 4 weeks per neurology. - Per neurology, Dr. Arrie Eastern note from 7/22, feels that Wernicke's could be higher on the differential given her acute decline following hospitalization.  Recommend to continue supportive care and follow-up on CSF autoimmune encephalitis panel/paraneoplastic panel.  Palliative care consult. - Patient mental status continues to wax and wane generally improving over the past 72 hours - SLP able to clear patient for PO intake, although it remains limited due to mental status - Advance diet as tolerated - likely still required PEG/J tube placement given unable to keep up with caloric/hydration needs - see below  Elevated AST/ALT, resolving  -Unclear etiology, normal at intake 3 weeks ago -We will discontinue scheduled Tylenol, unclear if this is primary driving factor or even causative given it was initiated just 24 hours ago -Abdomen benign, downtrending appropriately -  continue to follow q72h Lab Results  Component Value Date   ALT 508 (H) 02/05/2020   AST 152 (H) 02/05/2020   ALKPHOS 131 (H) 02/05/2020   BILITOT 0.2 (L) 02/05/2020   Moderate protein calorie malnutrition with dysphagia,  improving -Unfortunately given history of gastric bypass neither GI nor IR are able to assist with feeding tube placement. Surgery has been consulted as well to follow along for insight and recommendations.  Appreciate assistance. - s/p Cortrak placement -tube feeds ongoing per dietary/nutrition - Patient has now passed swallow screen with speech, p.o. intake continues to be quite poor and certainly not keeping up with caloric or hydration needs.   Aspiration risk  -Now able to pass swallow eval -continue to advance diet as tolerated -Continue to discuss need for feeding tube placement as above  Loose stools - Secondary to tube feeding with no evidence of infectious process  Hypernatremia, questionably hypovolemic - Continue half-normal saline and continue to monitor BMP - Previously  Hypovolemic hyponatremic - likely 2/2 NS bolus/fluids  Diabetes mellitus, type II, uncontrolled - Patient normally uses an insulin pump at baseline - Hemoglobin A1c 7.1 (on 01/14/2020) - Continue Levemir, insulin sliding scale and CBG monitoring   Vitamin D deficiency - Vitamin D, 25-hydroxy currently 43.42 -within normal limits   Anemia of chronic disease/ Sickle cell trait / Beta thalassemia - Hemoglobin currently 7.8 (small drop in the past few days, suspect dilutional given IVF) - Patient did receive 1 unit PRBC on 01/17/2020 - Continue to monitor CBC  Chronic kidney disease, stage IV - Baseline creatinine approximately 2.4 per chart review, currently improving with both IV and p.o. fluids Lab Results  Component Value Date   CREATININE 1.62 (H) 02/05/2020   CREATININE 1.73 (H) 02/03/2020   CREATININE 1.79 (H) 02/02/2020   Hyperkalemia - Resolved with lokelma - Continue to monitor BMP  Essential hypertension - Continue amlodipine, metoprolol - BP stable  L1-L5 vertebral body/sacral sclerosis - Noted per Hematology at [facility] -felt to be due to sickle cell trait  Asymptomatic  bacteriuria - UA shows rare bacteria, large leukocytes, >50 WBC - Family concerned given that the urine was dark in color - Urine culture multiple species- suggest recollection - Continue IVF   Goals of care - Palliative care consulted and appreciated, planning on meeting on 02/01/20  DVT Prophylaxis  Heparin- will hold given drop in hemoglobin and place on SCDs  Code Status: Full  Family Communication: None at bedside. Daughter and son via phone.  Disposition Plan:  Status is: Inpatient  Remains inpatient appropriate because:Altered mental status, IV treatments appropriate due to intensity of illness or inability to take PO and Inpatient level of care appropriate due to severity of illness   Dispo: The patient is from: Home              Anticipated d/c is to: SNF              Anticipated d/c date is: > 3 days              Patient currently is not medically stable to d/c.   Consultants Neurology   Procedures  EEG Lumbar puncture Cortrak  Antibiotics   Anti-infectives (From admission, onward)   None      Subjective:   Miranda Roman seen and examined today.  Very limited and markedly delayed speech but does answer simple questions appropriately  Objective:   Vitals:   02/05/20 2312 02/06/20 0344 02/06/20 0422 02/06/20 0500  BP: (!) 114/57 127/67    Pulse: 90 92    Resp: 18 19    Temp: 99.6 F (37.6 C) 99.1 F (37.3 C)    TempSrc: Axillary Axillary    SpO2: 100% 100%    Weight:   79 kg 79 kg    Intake/Output Summary (Last 24 hours) at 02/06/2020 0738 Last data filed at 02/06/2020 7939 Gross per 24 hour  Intake 1727 ml  Output 2025 ml  Net -298 ml   Filed Weights   02/05/20 0713 02/06/20 0422 02/06/20 0500  Weight: 79.2 kg 79 kg 79 kg   Exam  General: Well developed, well nourished, NAD, minimally interactive and occasionally follows commands, speech in one-word broken sentences and responses  HEENT: NCAT, mucous membranes moist.   Cardiovascular:  S1 S2 auscultated, RRR.  Respiratory: Clear to auscultation bilaterally   Abdomen: Soft, nontender, nondistended, + bowel sounds  Extremities: warm dry without cyanosis clubbing or edema  Neuro: Awake and alert, tracking movement, not speaking  Data Reviewed: I have personally reviewed following labs and imaging studies  CBC: Recent Labs  Lab 01/31/20 0314 02/01/20 0300 02/02/20 0229 02/05/20 0232 02/05/20 1019  WBC  --  7.9 10.1 8.1  --   HGB 7.8* 8.1* 8.3* 7.8* 9.3*  HCT 26.7* 27.4* 27.6* 25.5* 30.8*  MCV  --  88.1 88.7 89.2  --   PLT  --  312 308 285  --    Basic Metabolic Panel: Recent Labs  Lab 01/31/20 0314 02/01/20 0300 02/02/20 0229 02/03/20 0513 02/05/20 0232  NA 146* 147* 147* 148* 140  K 5.0 5.0 4.8 4.4 5.0  CL 111 113* 114* 116* 110  CO2 _0 GLUCOSE 121* 117* 74 104* 127*  BUN 86* 76* 70* 51* 65*  CREATININE 2.00* 1.84* 1.79* 1.73* 1.62*  CALCIUM 7.8* 8.0* 8.2* 8.7* 8.0*   GFR: Estimated Creatinine Clearance: 32.5 mL/min (A) (by C-G formula based on SCr of 1.62 mg/dL (H)). Liver Function Tests: Recent Labs  Lab 02/02/20 0229 02/03/20 0513 02/05/20 0232  AST 502* 265* 152*  ALT 1,221* 918* 508*  ALKPHOS 153* 149* 131*  BILITOT 0.4 0.6 0.2*  PROT 6.6 7.3 5.8*  ALBUMIN 2.0* 2.1* 2.0*   No results for input(s): LIPASE, AMYLASE in the last 168 hours. Recent Labs  Lab 02/02/20 0916  AMMONIA 25   Coagulation Profile: No results for input(s): INR, PROTIME in the last 168 hours. Cardiac Enzymes: No results for input(s): CKTOTAL, CKMB, CKMBINDEX, TROPONINI in the last 168 hours. BNP (last 3 results) No results for input(s): PROBNP in the last 8760 hours. HbA1C: No results for input(s): HGBA1C in the last 72 hours. CBG: Recent Labs  Lab 02/05/20 1221 02/05/20 1534 02/05/20 2030 02/05/20 2315 02/06/20 0341  GLUCAP 126* 94 117* 213* 140*   Lipid Profile: No results for input(s): CHOL, HDL, LDLCALC, TRIG, CHOLHDL, LDLDIRECT  in the last 72 hours. Thyroid Function Tests: No results for input(s): TSH, T4TOTAL, FREET4, T3FREE, THYROIDAB in the last 72 hours. Anemia Panel: No results for input(s): VITAMINB12, FOLATE, FERRITIN, TIBC, IRON, RETICCTPCT in the last 72 hours. Urine analysis:    Component Value Date/Time   COLORURINE YELLOW 01/29/2020 1147   APPEARANCEUR HAZY (A) 01/29/2020 1147   LABSPEC 1.012 01/29/2020 1147   PHURINE 7.0 01/29/2020 1147   GLUCOSEU NEGATIVE 01/29/2020 1147   HGBUR LARGE (A) 01/29/2020 1147   BILIRUBINUR NEGATIVE 01/29/2020 1147   BILIRUBINUR negative 09/28/2019 1556   KETONESUR NEGATIVE  01/29/2020 1147   PROTEINUR 100 (A) 01/29/2020 1147   UROBILINOGEN 0.2 09/28/2019 1556   NITRITE NEGATIVE 01/29/2020 1147   LEUKOCYTESUR LARGE (A) 01/29/2020 1147   Sepsis Labs:  Recent Results (from the past 240 hour(s))  Culture, Urine     Status: Abnormal   Collection Time: 01/29/20  1:31 PM   Specimen: Urine, Clean Catch  Result Value Ref Range Status   Specimen Description URINE, CLEAN CATCH  Final   Special Requests   Final    NONE Performed at [facility], [place] [address], [place], [place] [address]    Culture MULTIPLE SPECIES PRESENT, SUGGEST RECOLLECTION (A)  Final   Report Status 01/30/2020 FINAL  Final  Culture, Urine     Status: Abnormal   Collection Time: 01/31/20  5:17 PM   Specimen: Urine, Clean Catch  Result Value Ref Range Status   Specimen Description URINE, CLEAN CATCH  Final   Special Requests   Final    NONE Performed at [facility], [place] [address] [place],  [address]    Culture MULTIPLE SPECIES PRESENT, SUGGEST RECOLLECTION (A)  Final   Report Status 02/02/2020 FINAL  Final    Radiology Studies: No results found.  Scheduled Meds: . amLODipine  10 mg Per Tube Daily  . chlorhexidine  15 mL Mouth Rinse BID  . feeding supplement (ENSURE ENLIVE)  237 mL Oral TID BM  . feeding supplement (GLUCERNA 1.5 CAL)  840 mL Per Tube Q24H  .  feeding supplement (PROSource TF)  45 mL Per Tube TID  . heparin  5,000 Units Subcutaneous Q8H  . insulin aspart  0-15 Units Subcutaneous Q4H  . insulin aspart  4 Units Subcutaneous Q4H  . insulin detemir  12 Units Subcutaneous BID  . lidocaine  1 patch Transdermal Q24H  . mouth rinse  15 mL Mouth Rinse q12n4p  . metoprolol tartrate  100 mg Per Tube BID  . pantoprazole sodium  40 mg Per Tube Daily  . sennosides  5 mL Per Tube BID  . thiamine  100 mg Per Tube Daily  . venlafaxine  25 mg Per Tube BID   Continuous Infusions: . sodium chloride 50 mL/hr at 02/06/20 0006     LOS: 24 days   Time Spent in minutes   40 minutes  Little Ishikawa D.O. on 02/06/2020 at 7:38 AM

## 2020-02-07 LAB — BASIC METABOLIC PANEL
Anion gap: 8 (ref 5–15)
BUN: 59 mg/dL — ABNORMAL HIGH (ref 8–23)
CO2: 24 mmol/L (ref 22–32)
Calcium: 8.3 mg/dL — ABNORMAL LOW (ref 8.9–10.3)
Chloride: 107 mmol/L (ref 98–111)
Creatinine, Ser: 1.44 mg/dL — ABNORMAL HIGH (ref 0.44–1.00)
GFR calc Af Amer: 42 mL/min — ABNORMAL LOW (ref >60)
GFR calc non Af Amer: 36 mL/min — ABNORMAL LOW (ref >60)
Glucose, Bld: 76 mg/dL (ref 70–99)
Potassium: 4.5 mmol/L (ref 3.5–5.1)
Sodium: 139 mmol/L (ref 135–145)

## 2020-02-07 LAB — GLUCOSE, CAPILLARY
Glucose-Capillary: 133 mg/dL — ABNORMAL HIGH (ref 70–99)
Glucose-Capillary: 146 mg/dL — ABNORMAL HIGH (ref 70–99)
Glucose-Capillary: 284 mg/dL — ABNORMAL HIGH (ref 70–99)
Glucose-Capillary: 79 mg/dL (ref 70–99)
Glucose-Capillary: 79 mg/dL (ref 70–99)
Glucose-Capillary: 90 mg/dL (ref 70–99)
Glucose-Capillary: 90 mg/dL (ref 70–99)
Glucose-Capillary: 91 mg/dL (ref 70–99)
Glucose-Capillary: 91 mg/dL (ref 70–99)

## 2020-02-07 LAB — TYPE AND SCREEN
ABO/RH(D): A POS
Antibody Screen: NEGATIVE

## 2020-02-07 MED ORDER — INSULIN ASPART 100 UNIT/ML ~~LOC~~ SOLN
0.0000 [IU] | Freq: Three times a day (TID) | SUBCUTANEOUS | Status: DC
  Administered 2020-02-08: 2 [IU] via SUBCUTANEOUS
  Administered 2020-02-08: 5 [IU] via SUBCUTANEOUS
  Administered 2020-02-09: 3 [IU] via SUBCUTANEOUS
  Administered 2020-02-09: 2 [IU] via SUBCUTANEOUS
  Administered 2020-02-10: 3 [IU] via SUBCUTANEOUS
  Administered 2020-02-11: 5 [IU] via SUBCUTANEOUS
  Administered 2020-02-11: 2 [IU] via SUBCUTANEOUS
  Administered 2020-02-12: 3 [IU] via SUBCUTANEOUS
  Administered 2020-02-12: 5 [IU] via SUBCUTANEOUS
  Administered 2020-02-13 (×2): 3 [IU] via SUBCUTANEOUS
  Administered 2020-02-14: 2 [IU] via SUBCUTANEOUS
  Administered 2020-02-15: 5 [IU] via SUBCUTANEOUS

## 2020-02-07 MED ORDER — PROSOURCE PLUS PO LIQD
30.0000 mL | Freq: Two times a day (BID) | ORAL | Status: DC
  Administered 2020-02-07 – 2020-02-15 (×15): 30 mL via ORAL
  Filled 2020-02-07 (×18): qty 30

## 2020-02-07 MED ORDER — DEXTROSE-NACL 5-0.45 % IV SOLN: INTRAVENOUS | Status: DC

## 2020-02-07 MED ORDER — ENSURE MAX PROTEIN PO LIQD
11.0000 [oz_av] | Freq: Three times a day (TID) | ORAL | Status: DC
  Filled 2020-02-07: qty 330

## 2020-02-07 MED ORDER — INSULIN ASPART 100 UNIT/ML ~~LOC~~ SOLN
4.0000 [IU] | Freq: Three times a day (TID) | SUBCUTANEOUS | Status: DC
  Administered 2020-02-08 – 2020-02-15 (×18): 4 [IU] via SUBCUTANEOUS

## 2020-02-07 MED ORDER — ENSURE ENLIVE PO LIQD
237.0000 mL | Freq: Three times a day (TID) | ORAL | Status: DC
  Administered 2020-02-07 – 2020-02-15 (×22): 237 mL via ORAL

## 2020-02-07 NOTE — Progress Notes (Signed)
Physical Therapy Treatment Patient Details Name: Miranda Roman MRN: [id number] DOB: 06/25/1946 Today's Date: 02/07/2020    History of Present Illness Patient is a 74 year old female. 3 weeks prior she injured her foot tubing on vacation. The next day she began to have chest pain and quickly developed acute encephalopathy. She was flown from [place] (on vacation) to [place] where she lives. Workups negative with neuro looking at multifactorial encephalopathy-likely autoimmune    PT Comments    Patient progressing slowly with acute PT. She continues to require Total assist for bed mobility. Verbal/tactile cues required and extra time for processing for pt to initiate UE reaching to bed rail to initiate upper trunk rotation for rolling in bed. Max assist required to complete roll and initiate lower trunk mobility. PROM exercises reviewed with pt's daughter for hip/knee/ankel mobility. Pt limited with mobility by pain in Lt hip with flexion. EOS readjusted pt's boots and repositioned to Lt side-lying for pressure relief. She will continue to benefit from skilled PT interventions to progress mobility and strength. Acute will follow as able.   Follow Up Recommendations  SNF     Equipment Recommendations   (TBA)    Recommendations for Other Services       Precautions / Restrictions Precautions Precautions: Fall Restrictions Weight Bearing Restrictions: No    Mobility  Bed Mobility Overal bed mobility: Needs Assistance Bed Mobility: Rolling Rolling: Total assist;+2 for physical assistance         General bed mobility comments: Max +2 assist to roll to Rt /Lt side in bed for repositioning and for trunk activation. Pt required multimodal cues to initaite reaching UE's towards bed rail to begin upper trunk roll. MAX assist to bring lower trunk to sideling. Total A +2 to scoot pt up in bed.  Transfers         Ambulation/Gait    Stairs      Wheelchair Mobility     Modified Rankin (Stroke Patients Only)          Cognition Arousal/Alertness: Awake/alert Behavior During Therapy: Flat affect Overall Cognitive Status: Impaired/Different from baseline Area of Impairment: Following commands;Attention;Problem solving                   Current Attention Level: Focused   Following Commands: Follows one step commands inconsistently     Problem Solving: Slow processing;Difficulty sequencing;Decreased initiation;Requires verbal cues;Requires tactile cues General Comments: pt able to vocalize more today, said "hello" to PT today, stated "uncomfortable" when asked how she is doing. "Thank you" at end of session. Pt also smiled several times during session with music playing.       Exercises General Exercises - Lower Extremity Hip ABduction/ADduction: PROM;Both;10 reps;Supine Hip Flexion/Marching: PROM;Both;10 reps;Supine Hand Exercises Forearm Supination: PROM;Both;Supine;10 reps Forearm Pronation: PROM;Both;Supine;10 reps Other Exercises Other Exercises: PROM for bil Hip IR/ER, 10x each.     General Comments        Pertinent Vitals/Pain Pain Assessment: Faces Faces Pain Scale: Hurts even more Pain Location: grimacing with Lt hip flexion  Pain Descriptors / Indicators: Grimacing;Discomfort;Moaning Pain Intervention(s): Limited activity within patient's tolerance;Monitored during session;Repositioned           PT Goals (current goals can now be found in the care plan section) Acute Rehab PT Goals Patient Stated Goal: pt unable to state.  Family is hoping that she will improve and be able to engage and be as independent as possible  PT Goal Formulation: With family Time For Goal  Achievement: 02/18/20 (extended 2 more weeks) Progress towards PT goals: Progressing toward goals (slowly)    Frequency    Min 2X/week      PT Plan Current plan remains appropriate    Co-evaluation              AM-PAC PT "6 Clicks"  Mobility   Outcome Measure  Help needed turning from your back to your side while in a flat bed without using bedrails?: Total Help needed moving from lying on your back to sitting on the side of a flat bed without using bedrails?: Total Help needed moving to and from a bed to a chair (including a wheelchair)?: Total Help needed standing up from a chair using your arms (e.g., wheelchair or bedside chair)?: Total Help needed to walk in hospital room?: Total Help needed climbing 3-5 steps with a railing? : Total 6 Click Score: 6    End of Session   Activity Tolerance: Patient tolerated treatment well Patient left: in bed;with call bell/phone within reach;with bed alarm set;with family/visitor present Nurse Communication: Mobility status PT Visit Diagnosis: Other abnormalities of gait and mobility (R26.89);Muscle weakness (generalized) (M62.81);Other symptoms and signs involving the nervous system (R29.898)     Time: 0174-9449 PT Time Calculation (min) (ACUTE ONLY): 36 min  Charges:  $Therapeutic Exercise: 8-22 mins $Therapeutic Activity: 8-22 mins                     Verner Mould, DPT Acute Rehabilitation Services  Office [phone] Pager [phone]  02/07/2020 5:19 PM

## 2020-02-07 NOTE — TOC Progression Note (Signed)
Transition of Care [facility]) - Progression Note    Patient Details  Name: KASSANDRA MERIWEATHER MRN: [id number] Date of Birth: May 10, 1946  [facility] CM/SW Contact  Joanne Chars, LCSW Phone Number: 02/07/2020, 10:52 AM  Clinical Narrative:   CSW spoke to pt daughter, Truman Hayward, regarding SNF choice.  Daughter reports that she had spoken to MD about inpatient rehab option and this is her first choice.  CSW presented current bed offers and daughter was not interested in current options, asked about sending referral to [facility].  Referral sent to [facility].    Expected Discharge Plan: Skilled Nursing Facility Barriers to Discharge: Ship broker, Continued Medical Work up  Expected Discharge Plan and Services Expected Discharge Plan: [place] Choice: [place] arrangements for the past 2 months: Single Family Home                                       Social Determinants of Health (SDOH) Interventions    Readmission Risk Interventions No flowsheet data found.

## 2020-02-07 NOTE — Hospital Course (Addendum)
74 yo female with hypertension, insulin-dependent DM, Stage IV CKD and a history of bariatric surgery who was flight medi-vac'd to [facility] on 01/13/20 from [facility] in [place]. Primary historian is her daughter who was at bedside. See very bottom for initial [facility] hospital course   Initial onset of symptoms began 12/21/19 while vacationing in [place] -lengthy work-up without clear etiology, transferred here 01/13/2020 with what appears to be Warnicke's encephalopathy although we have been unable to diagnose this given her month spent in [place] with no notable thiamine levels and a very complex hospital stay.  At this point she continues to improve, albeit slowly, plan at this point is to advance diet and if patient does poorly will place a J-tube laparoscopically with surgery on Thursday(or Friday pending their schedule).  Essentially I told the family that if her p.o. intake did not improve over the weekend we would place feeding tube on Monday or Tuesday, Raquel Sarna continues to indicate patient is "doing better" but she continues to have abysmal calorie intake without NG feedings.  They think the patient will improve drastically if the NG is removed, we made a deal on Tuesday to remove the NG tube for 48 hours(through Wednesday night) while they try to advance patient's diet.  If patient is not to goal by Wednesday night tentative plan is n.p.o. at midnight and laparoscopic tube placement with Dr Redmond Pulling Thursday or Friday per his schedule.  ***I fully suspect the family will attempt to emplore you to wait longer however she has had the NG tube in here for some 4 weeks and was at [facility] 4 weeks prior to that with NG tube at their facility as well.  Also in is not here over the weekend or next week at which time it will need to be an open PEG tube placement which is not ideal when laparoscopic can be done this week.   ***************************************************************** *****Linward Foster  COURSE***** Initial onset of symptoms began 12/21/19 while vacationing in [place] with her family. She had been in her usual state of health in the morning and had been floating on a tube in a river when she slipped off it. She had stubbed her toe but had not reported hitting her head. They had gone back to the air bnb where they were staying. Last that day, she began to complain of chest pain that radiated to her back and was seen in an ED. In the ED, D-dimer was found to be elevated however VQ scan and doppler US was negative. She was admitted to cardiology for further workup which ended up being essentially unremarkable aside from some moderate pulm hypertension. At some point in that time frame, she developed worsening leukocytosis, febrile, and became encephalopathic. ID and neurology were consulted and she underwent an extensive investigation including brain MRI and LP which were both reportedly unremarkable. EEG was also obtained however did not reveal any findings to contribute to diagnosis.  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 She was started on thiamine. She also developed an anemia and thrombocytopenia, which in the context of fever and renal dysfunction, raised the concern for TTP. She underwent 5 rounds of plasmapheresis and was started on steroids--no significant improvement in mental status. ADAM TS 13 activity was found to be normal as well. Due to some sclerosis noted on a CT scan, she underwent a bone marrow biopsy by hematology, to  evaluate for lymphoma. During her time there, family wished for patient to be transferred back to [place] to be closer to family. They had reached out to a senator who was able to connect the patient to [facility] to be transferred back for further care.

## 2020-02-07 NOTE — Plan of Care (Signed)

## 2020-02-07 NOTE — NC FL2 (Signed)
[place] LEVEL OF CARE SCREENING TOOL     IDENTIFICATION  Patient Name: Miranda Roman Birthdate: 1945-11-22 Sex: female Admission Date (Current Location): 01/13/2020  [facility] and [place] Number:  Herbalist and Address:  [facility], [place] [address] [place], [place] [address]      Provider Number: [id number]  Attending Physician Name and Address:  Little Ishikawa, MD  Relative Name and Phone Number:       Current Level of Care: Hospital Recommended Level of Care: [place] Prior Approval Number:    Date Approved/Denied:   PASRR Number: [id number]  Discharge Plan: SNF    Current Diagnoses: Patient Active Problem List   Diagnosis Date Noted  . History of back pain   . Goals of care, counseling/discussion   . Inadequate oral nutritional intake   . Altered mental status   . Palliative care by specialist   . Pressure injury of skin 01/13/2020  . Acute encephalopathy 01/13/2020  . PAD (peripheral artery disease) ([place]) 06/06/2019  . Pancreatic cyst 06/06/2019  . Class 1 obesity due to excess calories with serious comorbidity and body mass index (BMI) of 31.0 to 31.9 in adult 06/06/2019  . Cyst of left kidney 03/10/2019  . Lumbago 02/27/2019  . Facet arthritis of lumbar region 02/27/2019  . Abnormal CT of the chest 12/28/2018  . Atrophic vaginitis 12/15/2018  . Atypical squamous cells of undetermined significance on cytologic smear of cervix (ASC-US) 12/15/2018  . Overweight with body mass index (BMI) 25.0-29.9 12/15/2018  . 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 Osteoporosis 12/15/2018  . Unspecified dyspareunia (CODE) 12/15/2018  . 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 Gastroesophageal reflux disease 08/26/2016  . Aortic valve regurgitation 02/11/2016  . Chest pain at rest 01/30/2015  . Atherosclerosis  of native coronary artery of native heart without angina pectoris 12/24/2012  . Benign essential HTN 12/24/2012  . Hyperlipidemia 12/24/2012  . 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 03/10/2012  . Diabetes mellitus with stage 4 chronic kidney disease ([place]) 03/10/2012    Orientation RESPIRATION BLADDER Height & Weight     Self  Normal Incontinent Weight: 173 lb 15.1 oz (78.9 kg) Height:     BEHAVIORAL SYMPTOMS/MOOD NEUROLOGICAL BOWEL NUTRITION STATUS      Incontinent  (see DC summary, unknown at this time)  AMBULATORY STATUS COMMUNICATION OF NEEDS Skin   Extensive Assist   Normal                       Personal Care Assistance Level of Assistance  Bathing, Feeding, Dressing Bathing Assistance: Maximum assistance Feeding assistance: Maximum assistance Dressing Assistance: Maximum assistance     Functional Limitations Info  Speech     Speech Info: Impaired (dysarthria)    SPECIAL CARE FACTORS FREQUENCY  PT (By licensed PT), OT (By licensed OT), Speech therapy     PT Frequency: 5x/wk OT Frequency: 5x/wk     Speech Therapy Frequency: 5x/wk      Contractures Contractures Info: Not present    Additional Factors Info  Code Status, Allergies, Psychotropic, Insulin Sliding Scale Code Status Info: Full Allergies Info: Cefepime, Lactose Intolerance (Gi) Psychotropic Info: Effexor 56m 2x/day Insulin Sliding Scale Info: 0-15 units every 4 hours, 4 units every 4 hours, Levemir 12 units 2x/day       Current Medications (02/07/2020):  This is the current hospital active medication  list Current Facility-Administered Medications  Medication Dose Route Frequency Provider Last Rate Last Admin  . (feeding supplement) PROSource Plus liquid 30 mL  30 mL Oral BID BM Greer Pickerel, MD      . amLODipine (NORVASC) tablet 10 mg  10 mg Per Tube Daily Cherene Altes, MD   10 mg at 02/07/20 0856  . chlorhexidine (PERIDEX) 0.12 % solution 15 mL  15 mL Mouth Rinse BID Kipp Brood, MD    15 mL at 02/07/20 0856  . dextrose 5 %-0.45 % sodium chloride infusion   Intravenous Continuous Zierle-Ghosh, Asia B, DO 50 mL/hr at 02/07/20 0044 New Bag at 02/07/20 0044  . docusate (COLACE) 50 MG/5ML liquid 100 mg  100 mg Per Tube BID PRN Cherene Altes, MD      . heparin injection 5,000 Units  5,000 Units Subcutaneous Q8H Mitzi Hansen, MD   5,000 Units at 02/07/20 0513  . insulin aspart (novoLOG) injection 0-15 Units  0-15 Units Subcutaneous Q4H Frederik Pear, MD   2 Units at 02/06/20 2013  . insulin aspart (novoLOG) injection 4 Units  4 Units Subcutaneous Q4H Cherene Altes, MD   4 Units at 02/06/20 1726  . insulin detemir (LEVEMIR) injection 12 Units  12 Units Subcutaneous BID Cherene Altes, MD   12 Units at 02/06/20 2157  . labetalol (NORMODYNE) injection 10-20 mg  10-20 mg Intravenous Q2H PRN Frederik Pear, MD   20 mg at 01/18/20 0018  . lidocaine (LIDODERM) 5 % 1 patch  1 patch Transdermal Q24H Amin, Jeanella Flattery, MD   1 patch at 02/06/20 1137  . MEDLINE mouth rinse  15 mL Mouth Rinse q12n4p Agarwala, Ravi, MD   15 mL at 02/06/20 1733  . metoprolol tartrate (LOPRESSOR) tablet 100 mg  100 mg Per Tube BID Lovey Newcomer T, NP   100 mg at 02/07/20 0856  . pantoprazole sodium (PROTONIX) 40 mg/20 mL oral suspension 40 mg  40 mg Per Tube Daily Elie Confer B, NP   40 mg at 02/07/20 0856  . polyethylene glycol (MIRALAX / GLYCOLAX) packet 17 g  17 g Per Tube Daily PRN Joselyn Glassman A, RPH      . protein supplement (ENSURE MAX) liquid  11 oz Oral TID Greer Pickerel, MD      . senna-docusate (Senokot-S) tablet 2 tablet  2 tablet Per Tube QHS PRN Theotis Burrow, RPH      . sennosides (SENOKOT) 8.8 MG/5ML syrup 5 mL  5 mL Per Tube BID Kipp Brood, MD   5 mL at 01/26/20 1033  . thiamine tablet 100 mg  100 mg Per Tube Daily Cherene Altes, MD   100 mg at 02/07/20 0856  . venlafaxine (EFFEXOR) tablet 25 mg  25 mg Per Tube BID Elie Confer B, NP   25 mg at 02/07/20  0902     Discharge Medications: Please see discharge summary for a list of discharge medications.  Relevant Imaging Results:  Relevant Lab Results:   Additional Information SS#: [id number]  Geralynn Ochs, LCSW

## 2020-02-07 NOTE — Progress Notes (Signed)
  Speech Language Pathology Treatment: Dysphagia  Patient Details Name: Miranda Roman MRN: [id number] DOB: [birth year] Today's Date: 02/07/2020 Time: 1350-1404 SLP Time Calculation (min) (ACUTE ONLY): 14 min  Assessment / Plan / Recommendation Clinical Impression  Pt demonstrates more spontaneous initiation today - she responded verbally to greetings/closing and answered some yes/no questions verbally.  Cortrak has been D/Cd to determine ability to meet nutritional needs orally. Trials of regular solids revealed adequate attention and mastication.  Overall motor response is slow, but functional.  D/W Miranda Roman - will advance diet to regular solids - this will improve options and may help facilitate intake. Pt ate watermelon and several bites of crackers, followed by iced tea, with no concerns for aspiration or pharyngeal impairment.   Pt will need full assistance with feeding.  SLP will continue to follow for swallowing/communication.   HPI HPI: Patient is a 74 year old female. 3 weeks prior she injured her foot tubing on vacation. The next day she began to have chest pain and quickly developed acute encephalopathy. She was flown from [place] (on vacation) to [place] where she lives. 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      SLP Plan  Continue with current plan of care       Recommendations  Diet recommendations: Regular;Thin liquid Liquids provided via: Straw;Cup Medication Administration: Whole meds with puree Supervision: Staff to assist with self feeding Postural Changes and/or Swallow Maneuvers: Seated upright 90 degrees                Oral Care Recommendations: Oral care BID SLP Visit Diagnosis: Dysphagia, unspecified (R13.10) Plan: Continue with current plan of care       GO                Miranda Roman 02/07/2020, 2:14 PM  Miranda Roman, [place] Office number [phone] Pager [phone]

## 2020-02-07 NOTE — Progress Notes (Addendum)
Nutrition Follow-up  DOCUMENTATION CODES:   Not applicable  INTERVENTION:  D/c Bariatric Advanced diet  Order Dysphagia 1 diet with thin liquids (per SLP documentation)  Calorie count   23m Prosource Plus po BID, each supplement provides 100 kcal and 15 grams of protein  Continue Ensure Enlive po TID, each supplement provides 350 kcal and 20 grams of protein  NUTRITION DIAGNOSIS:   Inadequate oral intake related to inability to eat as evidenced by NPO status.  Progressing, pt now on bariatric advanced diet.   GOAL:   Patient will meet greater than or equal to 90% of their needs  Progressing.  MONITOR:   Labs, I & O's, TF tolerance, Weight trends, Diet advancement  REASON FOR ASSESSMENT:   Consult Enteral/tube feeding initiation and management  ASSESSMENT:   74[facility] [place] [facility] [place] initially for chest pain on June 15th while vacationing. Patient subesequently developed AMS, fever, thrombocytopenia, TTP was ruled out, underwent 5 rounds of plasmapheresis and started on steroids with no significant improvement. Past medical history significant for HTN, CKD IV, IDDM, history of gastric bypass (2011).  7/12 s/p LP (negative); pt removed NGT 7/13 IVIG started 7/14 Cortrak replaced (gastric) 7/29 pt's diet advanced to dysphagia 1 with thin liquids 7/30 Cortrak replaced (gastric) 8/3 pt's diet changed to bariatric advanced  Discussed pt with RN and MD.    Pt previously had tube feeding changed to nocturnal and a calorie count ordered; however, pt had extremely poor intake. Calorie count was discontinued and TF was returned to continuous. Per MD, pt will need surgery to place long-term feeding tube given pt's history of gastric bypass.   Today, pt's family reported pt had improved intake and requested that the Cortrak be removed and that the pt's intake be monitored closely. If pt's PO  intake does not improve, family is agreeable to long-term feeding tube placement.  Of note, pt was changed to bariatric advanced diet today. This diet is only appropriate for patients that are recently post-op as it provides very little outside of 2-3oz of a protein with a dipping sauce. As this patient is 10 years post op, the bariatric advanced diet will not be able to provide enough calories/protein for this pt to meet her needs. Discussed with MD and will change pt back to dysphagia 1 diet with thin liquids as recommended by SLP.   Pt unlikely to be experiencing dumping syndrome as she is having only 1-2 bowel movements per day and is not having them every day. If pt begins to have multiple episodes of diarrhea, may consider possibility of dumping syndrome.   RD will order nutrient-dense supplements to help pt meet her kcal/protein needs po.   PO Intake: 0-50% x 4 recorded meals since last RD assessment (15% average meal intake)  UOP: [id number] hours I/O: +255.27m51mince admit  Labs reviewed. CBGs 90-62-37-62dications: Ensure Enlive TID, Novolog, Levemir, Protonix, Senokot, Thiamine  Diet Order:   Diet Order            DIET - DYS 1 Room service appropriate? Yes; Fluid consistency: Thin  Diet effective now                 EDUCATION NEEDS:   No education needs have been identified at this time  Skin:  Skin Assessment: Skin Integrity Issues: Skin Integrity Issues:: Other (Comment) Stage I: perineum Other: MASD buttocks  Last BM:  8/2 type  7  Height:   Ht Readings from Last 1 Encounters:  09/28/19 5' 4.8" (1.646 m)    Weight:   Wt Readings from Last 1 Encounters:  02/07/20 78.9 kg    BMI:  Body mass index is 29.12 kg/m.  Estimated Nutritional Needs:   Kcal:  2000-2200  Protein:  95-110  Fluid:  >/= 2 L/day    Larkin Ina, MS, RD, LDN RD pager number and weekend/on-call pager number located in [place].

## 2020-02-07 NOTE — Progress Notes (Signed)
Patient ID: GIANNIE SOLIDAY, female   DOB: May 05, 1946, 74 y.o.   MRN: [id number]   Acute Care Surgery Service Progress Note:    Chief Complaint/Subjective: Doesn't really answer any questions for me.  Son at [facility]  Objective: Vital signs in last 24 hours: Temp:  [98 F (36.7 C)-98.7 F (37.1 C)] 98.3 F (36.8 C) (08/03 0824) Pulse Rate:  [78-91] 89 (08/03 0824) Resp:  [16-20] 20 (08/03 0824) BP: (104-142)/(51-78) 122/76 (08/03 0824) SpO2:  [93 %-100 %] 100 % (08/03 0824) Weight:  [78.9 kg] 78.9 kg (08/03 0513) Last BM Date: 02/06/20  Intake/Output from previous day: 08/02 0701 - 08/03 0700 In: 714.7 [P.O.:320; I.V.:394.7] Out: 2300 [Urine:2300] Intake/Output this shift: No intake/output data recorded.  Lungs:  nonlabored  Cardiovascular: reg  Abd: soft, obese, nt  Extremities: no edema, +SCDs  Neuro: awake, gazes off at times, nonfocal  Lab Results: CBC  Recent Labs    02/05/20 0232 02/05/20 1019  WBC 8.1  --   HGB 7.8* 9.3*  HCT 25.5* 30.8*  PLT 285  --    BMET Recent Labs    02/05/20 0232 02/07/20 0320  NA 140 139  K 5.0 4.5  CL 110 107  CO2 24 24  GLUCOSE 127* 76  BUN 65* 59*  CREATININE 1.62* 1.44*  CALCIUM 8.0* 8.3*   LFT Hepatic Function Latest Ref Rng & Units 02/05/2020 02/03/2020 02/02/2020  Total Protein 6.5 - 8.1 g/dL 5.8(L) 7.3 6.6  Albumin 3.5 - 5.0 g/dL 2.0(L) 2.1(L) 2.0(L)  AST 15 - 41 U/L 152(H) 265(H) 502(H)  ALT 0 - 44 U/L 508(H) 918(H) 1,221(H)  Alk Phosphatase 38 - 126 U/L 131(H) 149(H) 153(H)  Total Bilirubin 0.3 - 1.2 mg/dL 0.2(L) 0.6 0.4   PT/INR No results for input(s): LABPROT, INR in the last 72 hours. ABG No results for input(s): PHART, HCO3 in the last 72 hours.  Invalid input(s): PCO2, PO2  Studies/Results:  Anti-infectives: Anti-infectives (From admission, onward)   None      Medications: Scheduled Meds: . amLODipine  10 mg Per Tube Daily  . chlorhexidine  15 mL Mouth Rinse BID  . feeding supplement  (ENSURE ENLIVE)  237 mL Oral TID BM  . heparin  5,000 Units Subcutaneous Q8H  . insulin aspart  0-15 Units Subcutaneous Q4H  . insulin aspart  4 Units Subcutaneous Q4H  . insulin detemir  12 Units Subcutaneous BID  . lidocaine  1 patch Transdermal Q24H  . mouth rinse  15 mL Mouth Rinse q12n4p  . metoprolol tartrate  100 mg Per Tube BID  . pantoprazole sodium  40 mg Per Tube Daily  . sennosides  5 mL Per Tube BID  . thiamine  100 mg Per Tube Daily  . venlafaxine  25 mg Per Tube BID   Continuous Infusions: . dextrose 5 % and 0.45% NaCl 50 mL/hr at 02/07/20 0044   PRN Meds:.docusate, labetalol, polyethylene glycol, senna-docusate  Assessment/Plan: Patient Active Problem List   Diagnosis Date Noted  . History of back pain   . Goals of care, counseling/discussion   . Inadequate oral nutritional intake   . Altered mental status   . Palliative care by specialist   . Pressure injury of skin 01/13/2020  . Acute encephalopathy 01/13/2020  . PAD (peripheral artery disease) ([place]) 06/06/2019  . Pancreatic cyst 06/06/2019  . Class 1 obesity due to excess calories with serious comorbidity and body mass index (BMI) of 31.0 to 31.9 in adult 06/06/2019  . Cyst  of left kidney 03/10/2019  . Lumbago 02/27/2019  . Facet arthritis of lumbar region 02/27/2019  . Abnormal CT of the chest 12/28/2018  . Atrophic vaginitis 12/15/2018  . Atypical squamous cells of undetermined significance on cytologic smear of cervix (ASC-US) 12/15/2018  . Overweight with body mass index (BMI) 25.0-29.9 12/15/2018  . 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 Osteoporosis 12/15/2018  . Unspecified dyspareunia ([place]) 12/15/2018  . 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 Gastroesophageal reflux disease 08/26/2016  . Aortic valve regurgitation 02/11/2016  . Chest pain at rest 01/30/2015  . Atherosclerosis of native coronary artery of  native heart without angina pectoris 12/24/2012  . Benign essential HTN 12/24/2012  . Hyperlipidemia 12/24/2012  . 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 03/10/2012  . Diabetes mellitus with stage 4 chronic kidney disease ([place]) 03/10/2012   HTN HLD DM2   Encephalopathy of unknown etiology Malnutrition  Dysphagia  Hx of Laparoscopic RYGB on 08/14/2009 by Dr. Lucia Gaskins  Family is unaware if pt was taking MVI prior to hospitalization in [place]. They deny any repetitive episodes of vomiting.   Extensive conversation with patient's son and daughter via phone Discussed her anatomy Discussed placement of surgical gastrotomy tube in her gastric remnant.  I believe that this can be done laparoscopic although there is a possibility of open.  Discussed risk and benefits of the procedure including but not limited to bleeding, infection, injury to surrounding structures, wound issues, G-tube dislodgment and what would happen should that occur, perioperative cardiac and pulmonary events, blood clot formation, G-tube issues such as skin irritation, leak.  Explained that this could be a long-term or short-term supplemental feeding tube  They asked very appropriate and insightful questions.  They report that patient actually had significantly improved oral intake yesterday probably around 800 cal.  They would like to avoid surgery as well as placement of the feeding tube if possible.  They were concerned that perhaps general anesthesia could worsen her mentation.  I told them in my opinion that general anesthesia probably would not exacerbate her encephalopathy as it was not due to an underlying ischemic event to the best of our knowledge.  It appears to be more consistent with a metabolic phenomenon.  They were also concerned that general anesthesia could potentially slow her recovery.  I explained that the opposite could be more likely to happen in the sense that if she becomes malnourished that would be more  likely to slow her recovery.   They have asked that the cortrak come out and see how she does over the next 48hrs. If they can't get her to rally with oral intake, then they will agree with g tube placement.   I will ask dietitian to make sure ensure shakes are appropriate for this pt (carb content/sugar content) to minimize potential episodes of dumping.   D/w with Dr Avon Gully  Disposition:  LOS: 25 days    Leighton Ruff. Redmond Pulling, MD, FACS General, Bariatric, & Minimally Invasive Surgery [phone] [facility]

## 2020-02-07 NOTE — Progress Notes (Signed)
PROGRESS NOTE    MAHLET JERGENS  JAS:[id number] DOB: [birth year] DOA: 01/13/2020 PCP: Glendale Chard, MD   Brief Narrative:  74 year old with a history of DM2, CKD stage IV, [place] trait, B thalasemia, and prior bariatric surgery who was transferred to [facility] from [facility] where she originally presented 12/21/2019 with complaints of chest pain.  A VQ scan and Dopplers were negative at that facility, and a cardiac work-up revealed only moderate pulmonary hypertension.  Her hospital course was complicated by the sudden onset of encephalopathy leukocytosis and fever.  ID and Neurology were consulted and the patient underwent MRI of the brain and a lumbar puncture, both of which were unrevealing.  EEG was unrevealing.  Her case was further complicated by anemia, thrombocytopenia, and acute kidney injury with consideration being given to possible TTP.  She was treated with 5 rounds of plasmapheresis and steroids without improvement.  Adam TS 13 was normal.  Bone marrow biopsy was performed.  At request of the family she was transferred to [facility].  Work-up after arrival to [facility] has included a negative long-term EEG, negative MRI brain, and negative repeat LP.  She had a repeat course of IVIG x5 days now completed.  Over the past week patient has continued to improve minimally daily, has now been able to pass speech evaluation with advancing diet.  She is currently unable to keep up with caloric and fluid needs and is having tube feedings and free water flushes overnight via NG tube.  NG tube has been removed on 02/07/2020 per family discussion, they wish to advance patient's diet in hopes that removal of NG will assist in her swallowing and speech.  We discussed that if she is not tolerating p.o. adequately, at least 80% of goal by Wednesday night 02/08/2020 we will tentatively plan for laparoscopic feeding tube placement with surgery, Dr. Redmond Pulling either Thursday, 02/09/2020 or Friday, 02/10/2020 pending his  schedule.  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 on her own, as such NG tube was removed this morning, will give patient until the end of 09/08/2019 to advance diet appropriately.  If she is unable to tolerate 80% or better of her daily calories and recommended fluid intake by dietary will advance to laparoscopic feeding tube placement with Dr. Redmond Pulling in general surgery either Thursday or Friday this week per their schedule. - LP 6/18 negative for infection; repeat LP 7/12 unremarkable - B12, folate, iron studies, MMA, TSH, ammonia,autoimmune studies, heavy  metal screen, Lyme's dz, MAB, arbovirus,wes nile studies unremarkable - EEG negative for seizure activity - MRI brain 7/11 negative for acute findings - Patient previously on [place], Neurology on 01/25/2020, Dr. Lorraine Lax, recommended discontinuing and not tapering Keppra and monitoring closely for seizures. No need to start stimulants at  this time.  - Patient has completed 5 days of IVIG, which was started on 01/16/2020  - Per neurology, Dr. Arrie Eastern note from 7/22, feels that Wernicke's could be higher on the differential given her acute decline following hospitalization.  Recommend to continue supportive care and follow-up on CSF autoimmune encephalitis panel/paraneoplastic panel (sent out and still pending as of 02/07/20) care consult.   Elevated AST/ALT, resolving  -Unclear etiology, normal at intake 3 weeks ago -We will discontinue scheduled Tylenol, unclear if this is primary driving factor or if in the setting of poor p.o. intake and dehydration -Abdomen benign, downtrending appropriately - continue to follow q72h -if worsening or patient's physical exam changes would recommend right upper quadrant ultrasound versus CT abdomen Lab Results  Component Value Date   ALT 508 (H) 02/05/2020   AST 152 (H) 02/05/2020   ALKPHOS 131 (H) 02/05/2020   BILITOT 0.2 (L) 02/05/2020   Moderate protein calorie malnutrition with dysphagia, improving -Unfortunately given history of gastric bypass neither GI nor IR are able to assist with feeding tube placement. Surgery has been consulted as well to follow along for insight and recommendations.  Appreciate assistance. - s/p Cortrak placement -tube feeds ongoing per dietary/nutrition - Patient has now passed swallow screen with speech, p.o. intake continues to be quite poor and certainly not keeping up with caloric or hydration needs.   Aspiration risk  -Now able to pass swallow eval -NG tube removed 02/07/2020, hopefully this will improve patient's p.o. tolerance  Loose stools - Secondary to tube feeding with no evidence of infectious process  Hypernatremia, questionably hypovolemic - Continue half-normal saline and continue to monitor BMP - Previously  Hypovolemic hyponatremic - likely 2/2 NS bolus/fluids  Diabetes mellitus, type II, uncontrolled - Patient normally uses an insulin  pump at baseline - Hemoglobin A1c 7.1 (on 01/14/2020) - Continue Levemir, insulin sliding scale and CBG monitoring   Vitamin D deficiency - Vitamin D, 25-hydroxy currently 43.42 -within normal limits   Anemia of chronic disease/ Sickle cell trait / Beta thalassemia - Hemoglobin currently 7.8 (small drop in the past few days, suspect dilutional given IVF) - Patient did receive 1 unit PRBC on 01/17/2020 - Continue to monitor CBC  Chronic kidney disease, stage IV - Baseline creatinine approximately 2.4 per chart review, currently improving with both IV and p.o. fluids Lab Results  Component Value Date   CREATININE 1.44 (H) 02/07/2020   CREATININE 1.62 (H) 02/05/2020   CREATININE 1.73 (H) 02/03/2020   Hyperkalemia - Resolved with lokelma - Continue to monitor BMP  Essential hypertension - Continue amlodipine, metoprolol - BP stable  L1-L5 vertebral body/sacral sclerosis - Noted per Hematology at [facility] -felt to be due to sickle cell trait  Asymptomatic bacteriuria - UA shows rare bacteria, large leukocytes, >50 WBC - Family concerned given that the urine was dark in color - Urine culture multiple species- suggest recollection - Continue IVF   Goals of care - Palliative care consulted and appreciated, planning on meeting on 02/01/20  DVT Prophylaxis  Heparin- will hold given drop in hemoglobin and place on SCDs  Code Status: Full  Family Communication: None at bedside. Daughter and son via phone.  Disposition Plan:  Status is: Inpatient  Remains inpatient appropriate because:Altered mental status, IV treatments appropriate due to intensity of illness or inability to take PO and Inpatient level of care appropriate due to severity of illness   Dispo: The patient is from: Home              Anticipated d/c is to: SNF              Anticipated d/c date is: > 3 days              Patient currently is not medically stable to d/c.   Consultants Neurology   General surgery  Procedures  EEG Lumbar puncture Cortrak  Antibiotics   Anti-infectives (From admission, onward)   None      Subjective:   [name] seen and examined today.  Very limited and markedly delayed speech but does answer simple questions appropriately -yes/no questions are more easily responded to by the patient Objective:   Vitals:   02/06/20 2157 02/06/20 2331 02/07/20 0423 02/07/20 0513  BP: 116/61 119/78 (!) 142/77   Pulse: 91 80 87   Resp:  16 17   Temp:  98.7 F (37.1 C) 98.6 F (37 C)   TempSrc:  Oral Oral   SpO2:  100% 93%   Weight:    78.9 kg    Intake/Output Summary (Last 24 hours) at 02/07/2020 0746 Last data filed at 02/07/2020 0447 Gross per 24 hour  Intake 714.7 ml  Output 2300 ml  Net -1585.3 ml   Filed Weights   02/06/20 0422 02/06/20 0500 02/07/20 0513  Weight: 79 kg 79 kg 78.9 kg   Exam  General: Well developed, well nourished, NAD, minimally interactive and occasionally follows commands, speech in one-word broken sentences and responses with very delayed responses.  HEENT: NCAT, mucous membranes moist.   Cardiovascular: S1 S2 auscultated, RRR.  Respiratory: Clear to auscultation bilaterally   Abdomen: Soft, nontender, nondistended, + bowel sounds  Extremities: warm dry without cyanosis clubbing or edema  Neuro: Awake and alert, tracking movement, follow simple commands without overt deficits  Data Reviewed: I have personally reviewed following labs and imaging studies  CBC: Recent Labs  Lab 02/01/20 0300 02/02/20 0229 02/05/20 0232 02/05/20 1019  WBC 7.9 10.1 8.1  --   HGB 8.1* 8.3* 7.8* 9.3*  HCT 27.4* 27.6* 25.5* 30.8*  MCV 88.1 88.7 89.2  --   PLT 312 308 285  --    Basic Metabolic Panel: Recent Labs  Lab 02/01/20 0300 02/02/20 0229 02/03/20 0513 02/05/20 0232 02/07/20 0320  NA 147* 147* 148* 140 139  K 5.0 4.8 4.4 5.0 4.5  CL 113* 114* 116* 110 107  CO2 _0 GLUCOSE 117* 74 104* 127*  76  BUN 76* 70* 51* 65* 59*  CREATININE 1.84* 1.79* 1.73* 1.62* 1.44*  CALCIUM 8.0* 8.2* 8.7* 8.0* 8.3*   GFR: Estimated Creatinine Clearance: 36.5 mL/min (A) (by C-G formula based on SCr of 1.44 mg/dL (H)). Liver Function Tests: Recent Labs  Lab 02/02/20 0229 02/03/20 0513 02/05/20 0232  AST 502* 265* 152*  ALT 1,221* 918* 508*  ALKPHOS 153* 149* 131*  BILITOT 0.4 0.6 0.2*  PROT 6.6 7.3 5.8*  ALBUMIN 2.0* 2.1* 2.0*   No results for input(s): LIPASE, AMYLASE in the last 168 hours. Recent Labs  Lab 02/02/20 0916  AMMONIA 25   Coagulation Profile: No results for input(s): INR, PROTIME in the last 168 hours. Cardiac Enzymes: No results for  input(s): CKTOTAL, CKMB, CKMBINDEX, TROPONINI in the last 168 hours. BNP (last 3 results) No results for input(s): PROBNP in the last 8760 hours. HbA1C: No results for input(s): HGBA1C in the last 72 hours. CBG: Recent Labs  Lab 02/06/20 1652 02/06/20 1948 02/06/20 2108 02/07/20 0021 02/07/20 0424  GLUCAP 74 136* 135* 90 79   Lipid Profile: No results for input(s): CHOL, HDL, LDLCALC, TRIG, CHOLHDL, LDLDIRECT in the last 72 hours. Thyroid Function Tests: No results for input(s): TSH, T4TOTAL, FREET4, T3FREE, THYROIDAB in the last 72 hours. Anemia Panel: No results for input(s): VITAMINB12, FOLATE, FERRITIN, TIBC, IRON, RETICCTPCT in the last 72 hours. Urine analysis:    Component Value Date/Time   COLORURINE YELLOW 01/29/2020 1147   APPEARANCEUR HAZY (A) 01/29/2020 1147   LABSPEC 1.012 01/29/2020 1147   PHURINE 7.0 01/29/2020 1147   GLUCOSEU NEGATIVE 01/29/2020 1147   HGBUR LARGE (A) 01/29/2020 1147   BILIRUBINUR NEGATIVE 01/29/2020 1147   BILIRUBINUR negative 09/28/2019 1556   KETONESUR NEGATIVE 01/29/2020 1147   PROTEINUR 100 (A) 01/29/2020 1147   UROBILINOGEN 0.2 09/28/2019 1556   NITRITE NEGATIVE 01/29/2020 1147   LEUKOCYTESUR LARGE (A) 01/29/2020 1147   Sepsis Labs:  Recent Results (from the past 240 hour(s))   Culture, Urine     Status: Abnormal   Collection Time: 01/29/20  1:31 PM   Specimen: Urine, Clean Catch  Result Value Ref Range Status   Specimen Description URINE, CLEAN CATCH  Final   Special Requests   Final    NONE Performed at [facility], [place] [address] [place], [place] [address]    Culture MULTIPLE SPECIES PRESENT, SUGGEST RECOLLECTION (A)  Final   Report Status 01/30/2020 FINAL  Final  Culture, Urine     Status: Abnormal   Collection Time: 01/31/20  5:17 PM   Specimen: Urine, Clean Catch  Result Value Ref Range Status   Specimen Description URINE, CLEAN CATCH  Final   Special Requests   Final    NONE Performed at [facility], [place] [address], [place], [place] [address]    Culture MULTIPLE SPECIES PRESENT, SUGGEST RECOLLECTION (A)  Final   Report Status 02/02/2020 FINAL  Final    Radiology Studies: No results found.  Scheduled Meds: . amLODipine  10 mg Per Tube Daily  . chlorhexidine  15 mL Mouth Rinse BID  . feeding supplement (ENSURE ENLIVE)  237 mL Oral TID BM  . heparin  5,000 Units Subcutaneous Q8H  . insulin aspart  0-15 Units Subcutaneous Q4H  . insulin aspart  4 Units Subcutaneous Q4H  . insulin detemir  12 Units Subcutaneous BID  . lidocaine  1 patch Transdermal Q24H  . mouth rinse  15 mL Mouth Rinse q12n4p  . metoprolol tartrate  100 mg Per Tube BID  . pantoprazole sodium  40 mg Per Tube Daily  . sennosides  5 mL Per Tube BID  . thiamine  100 mg Per Tube Daily  . venlafaxine  25 mg Per Tube BID   Continuous Infusions: . dextrose 5 % and 0.45% NaCl 50 mL/hr at 02/07/20 0044     LOS: 25 days   Time Spent in minutes   40 minutes  Little Ishikawa D.O. on 02/07/2020 at 7:46 AM

## 2020-02-08 DIAGNOSIS — R7401 Elevation of levels of liver transaminase levels: Secondary | ICD-10-CM

## 2020-02-08 DIAGNOSIS — R1319 Other dysphagia: Secondary | ICD-10-CM

## 2020-02-08 LAB — CBC
HCT: 27.6 % — ABNORMAL LOW (ref 36.0–46.0)
Hemoglobin: 8.7 g/dL — ABNORMAL LOW (ref 12.0–15.0)
MCH: 26.6 pg (ref 26.0–34.0)
MCHC: 31.5 g/dL (ref 30.0–36.0)
MCV: 84.4 fL (ref 80.0–100.0)
Platelets: 236 10*3/uL (ref 150–400)
RBC: 3.27 MIL/uL — ABNORMAL LOW (ref 3.87–5.11)
RDW: 16.5 % — ABNORMAL HIGH (ref 11.5–15.5)
WBC: 8.1 10*3/uL (ref 4.0–10.5)
nRBC: 0 % (ref 0.0–0.2)

## 2020-02-08 LAB — COMPREHENSIVE METABOLIC PANEL
ALT: 244 U/L — ABNORMAL HIGH (ref 0–44)
AST: 63 U/L — ABNORMAL HIGH (ref 15–41)
Albumin: 1.9 g/dL — ABNORMAL LOW (ref 3.5–5.0)
Alkaline Phosphatase: 93 U/L (ref 38–126)
Anion gap: 12 (ref 5–15)
BUN: 51 mg/dL — ABNORMAL HIGH (ref 8–23)
CO2: 19 mmol/L — ABNORMAL LOW (ref 22–32)
Calcium: 8.3 mg/dL — ABNORMAL LOW (ref 8.9–10.3)
Chloride: 105 mmol/L (ref 98–111)
Creatinine, Ser: 1.54 mg/dL — ABNORMAL HIGH (ref 0.44–1.00)
GFR calc Af Amer: 39 mL/min — ABNORMAL LOW (ref >60)
GFR calc non Af Amer: 33 mL/min — ABNORMAL LOW (ref >60)
Glucose, Bld: 132 mg/dL — ABNORMAL HIGH (ref 70–99)
Potassium: 4.9 mmol/L (ref 3.5–5.1)
Sodium: 136 mmol/L (ref 135–145)
Total Bilirubin: 0.4 mg/dL (ref 0.3–1.2)
Total Protein: 5.9 g/dL — ABNORMAL LOW (ref 6.5–8.1)

## 2020-02-08 LAB — GLUCOSE, CAPILLARY
Glucose-Capillary: 139 mg/dL — ABNORMAL HIGH (ref 70–99)
Glucose-Capillary: 115 mg/dL — ABNORMAL HIGH (ref 70–99)
Glucose-Capillary: 245 mg/dL — ABNORMAL HIGH (ref 70–99)
Glucose-Capillary: 130 mg/dL — ABNORMAL HIGH (ref 70–99)
Glucose-Capillary: 254 mg/dL — ABNORMAL HIGH (ref 70–99)

## 2020-02-08 LAB — VITAMIN B1: Vitamin B1 (Thiamine): 317.6 nmol/L — ABNORMAL HIGH (ref 66.5–200.0)

## 2020-02-08 MED ORDER — DULOXETINE HCL 30 MG PO CPEP
30.0000 mg | ORAL_CAPSULE | Freq: Every day | ORAL | Status: DC
  Administered 2020-02-08 – 2020-02-12 (×5): 30 mg via ORAL
  Filled 2020-02-08 (×5): qty 1

## 2020-02-08 MED ORDER — ACETAMINOPHEN 325 MG PO TABS
325.0000 mg | ORAL_TABLET | Freq: Four times a day (QID) | ORAL | Status: DC | PRN
  Administered 2020-02-08 – 2020-02-12 (×6): 325 mg via ORAL
  Filled 2020-02-08 (×6): qty 1

## 2020-02-08 NOTE — Progress Notes (Signed)
POA # called at [phone] with nightly update, w/o answer nor available VM to leave a message.

## 2020-02-08 NOTE — Progress Notes (Signed)
Calorie Count Note  48 hour calorie count ordered.  Diet: carbohydrate modified Supplements: Ensure Enlive TID, Prosource Plus BID    Day 1 Results:  Breakfast: no meal documentation available Lunch: 112 kcals, 6 grams of protein Dinner: 57 kcals, 2 grams of protein Supplements: 3 Ensure Enlive, 34m Prosource Plus   Total intake: 1419 kcal (70% of minimum estimated needs)  98 protein (100% of minimum estimated needs)   Day 2 Results: (will follow up with lunch and dinner results tomorrow) Breakfast: 200 kcals, 6 grams of protein    Nutrition Dx: Inadequate oral intake related to inability to eat as evidenced by NPO status.   Progressing, pt now on carb modified diet.   Goal: Patient will meet greater than or equal to 90% of their needs  Progressing.  Intervention: -Continue calorie count -Continue Ensure Enlive po TID, each supplement provides 350 kcal and 20 grams of protein -Continue 35mProsource Plus po BID, each supplements provides 100 kcal and 15 grams of protein -Magic cup TID with meals, each supplement provides 290 kcal and 9 grams of protein (ordered at request of pt's daughter) -Recommend liberalizing diet to encourage PO intake (secure chat sent to MD)    AmLarkin InaMS, RD, LDN RD pager number and weekend/on-call pager number located in [facility] [place]

## 2020-02-08 NOTE — Progress Notes (Signed)
[facility]  PROGRESS NOTE  MERDIS SNODGRASS SFK:[id number] DOB: Feb 21, 1946 DOA: 01/13/2020 PCP: Glendale Chard, MD   Brief HPI:   74 year old female with a history of diabetes mellitus type 2, CKD stage IV,  trait, beta thalassemia, prior bariatric surgery was transferred to [facility] from [place] where she originally presented on 12/21/2019 with complaints of chest pain.  VQ scan and Dopplers were negative at that time.  Cardiac work-up revealed only moderate pulmonary hypertension.  Her hospital course was complicated by sudden onset of encephalopathy, leukocytosis and fever.  ID and neurology were consulted and patient underwent MRI of the brain and lumbar puncture.  Both were unrevealing.  EEG was also unrevealing.  Her case was complicated by anemia, thrombocytopenia, acute kidney injury with consideration being given to be TTP.  She was treated with 5 rounds of plasmapheresis and steroids without improvement.  Adam TS 13 was also normal.  Bone marrow biopsy was performed.  At the request of family she was transferred to [facility].  Work-up after she arrived: Included negative long-term EEG, negative MRI brain, negative repeat LP.  She had repeat course of IVIG x5 days which is now complete. For the past 1 week patient has continued to improve minimally daily and has been able to pass speech evaluation with advancing diet.  She is currently unable to keep up with caloric and fluid needs and is having tube feeding and free water flushes overnight via NG tube.  NG tube was removed on 02/07/2020 per family request and they wish to advance patient's diet and hope that removal of NG will assist in her swallowing and speech.  General surgery is on standby for likely laparoscopic feeding tube placement on 02/09/2020 if patient unable to take enough by mouth.    Subjective   Patient seen and examined, alert, slow to respond.  Not following commands.   Assessment/Plan:     1. Acute  metabolic encephalopathy-likely Wernicke's versus autoimmune encephalopathy.  Patient mental status is waxing and waning.  Feeding tube was removed on 02/07/2020 and the hope that patient will start taking by mouth.  If patient unable to take 80% better of daily calories and recommended food intake she will need laparoscopic feeding tube placement per general surgery.  Lumbar puncture on 12/23/2019 was negative for infection.  Repeat lumbar puncture on 01/16/2020 was again unremarkable.  B12, folate, iron studies, MMA, TSH, ammonia, autoimmune studies, however will screen, Lyme disease, MMA, arbovirus, West Nile virus studies unremarkable.  EEG negative for seizure activity.  MRI brain on 01/15/2020 is negative for acute findings.  Patient was previously on [place].  Neurology on 01/25/2020, recommended discontinuing or tapering Keppra and monitoring closely for seizures.  Patient completed 5 days of IVIG which was started on 01/16/2020.  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  Recommended to continue supportive care and follow-up on CSF autoimmune encephalitis panel paraneoplastic panel.  Sent out on 02/07/2020. 2. Transaminitis-patient has significant elevated LFTs with AST and ALT.  Unclear etiology.  LFTs are slowly improving.  Will obtain hepatitis panel and abdominal ultrasound.  Low-dose Tylenol has been started for pain as patient family wants to avoid opioids.  Follow LFTs in a.m. 3. Moderate protein calorie malnutrition dysphagia-slowly improving.  Due to history of gastric bypass, either GI or IR able to assist with feeding tube placement.  Surgery has been consulted for laparoscopic feeding tube placement.  Cortrack tube has been removed.  Patient on p.o. diet. 4. Diabetes mellitus  type 2-patient normally uses insulin pump at baseline.  Hemoglobin A1c is 9.1.  Continue Levemir, sliding scale insulin NovoLog.  CBG monitoring. 5. CKD stage IV-patient baseline creatinine 2.4 per chart review.   Today creatinine 1.54. 6. L1-L5 vertebral body/sacral sclerosis-noted per hematology at [facility].  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 7. Hypertension -continue amlodipine, metoprolol. 8. Asymptomatic bacteriuria-urine culture grew multiple species. 9. Goals of care-palliative care consulted    Scheduled medications:   . (feeding supplement) PROSource Plus  30 mL Oral BID BM  . amLODipine  10 mg Per Tube Daily  . chlorhexidine  15 mL Mouth Rinse BID  . DULoxetine  30 mg Oral Daily  . feeding supplement (ENSURE ENLIVE)  237 mL Oral TID BM  . heparin  5,000 Units Subcutaneous Q8H  . insulin aspart  0-15 Units Subcutaneous TID WC  . insulin aspart  4 Units Subcutaneous TID AC & HS  . insulin detemir  12 Units Subcutaneous BID  . lidocaine  1 patch Transdermal Q24H  . mouth rinse  15 mL Mouth Rinse q12n4p  . metoprolol tartrate  100 mg Per Tube BID  . pantoprazole sodium  40 mg Per Tube Daily  . sennosides  5 mL Per Tube BID  . thiamine  100 mg Per Tube Daily         CBG: Recent Labs  Lab 02/07/20 2048 02/08/20 0312 02/08/20 0614 02/08/20 1258 02/08/20 1724  GLUCAP 133* 139* 115* 245* 130*    SpO2: (!) 85 %    CBC: Recent Labs  Lab 02/02/20 0229 02/05/20 0232 02/05/20 1019 02/08/20 0349  WBC 10.1 8.1  --  8.1  HGB 8.3* 7.8* 9.3* 8.7*  HCT 27.6* 25.5* 30.8* 27.6*  MCV 88.7 89.2  --  84.4  PLT 308 285  --  149    Basic Metabolic Panel: Recent Labs  Lab 02/02/20 0229 02/03/20 0513 02/05/20 0232 02/07/20 0320 02/08/20 0349  NA 147* 148* 140 139 136  K 4.8 4.4 5.0 4.5 4.9  CL 114* 116* 110 107 105  CO2 _0 19*  GLUCOSE 74 104* 127* 76 132*  BUN 70* 51* 65* 59* 51*  CREATININE 1.79* 1.73* 1.62* 1.44* 1.54*  CALCIUM 8.2* 8.7* 8.0* 8.3* 8.3*     Liver Function Tests: Recent Labs  Lab 02/02/20 0229 02/03/20 0513 02/05/20 0232 02/08/20 0349  AST 502* 265* 152* 63*  ALT 1,221* 918* 508* 244*  ALKPHOS 153* 149* 131* 93  BILITOT 0.4 0.6  0.2* 0.4  PROT 6.6 7.3 5.8* 5.9*  ALBUMIN 2.0* 2.1* 2.0* 1.9*     Antibiotics: Anti-infectives (From admission, onward)   None       DVT prophylaxis: SCDs  Code Status: Full code  Family Communication: No family at bedside    Status is: Inpatient  Dispo: The patient is from: Home              Anticipated d/c is to: Skilled nursing facility              Anticipated d/c date is: 02/10/2020              Patient currently not medically stable for discharge  Barrier to discharge-awaiting feeding tube placement by surgery     Consultants:  General surgery  Neurology  Procedures:  EEG  Lumbar puncture  Cortrack feeding tube   Objective   Vitals:   02/08/20 0355 02/08/20 0734 02/08/20 1257 02/08/20 1556  BP:  138/68  118/62 123/62  Pulse:  88 85 78  Resp:  _0 Temp:  98.4 F (36.9 C) 98.2 F (36.8 C) 98.5 F (36.9 C)  TempSrc:  Oral Oral Oral  SpO2:  100% 100% (!) 85%  Weight: 79.1 kg       Intake/Output Summary (Last 24 hours) at 02/08/2020 1927 Last data filed at 02/08/2020 0454 Gross per 24 hour  Intake 237 ml  Output 900 ml  Net -663 ml    08/03 0701 - 08/04 1900 In: 9355 [P.O.:854] Out: 2050 [Urine:2050]  Filed Weights   02/06/20 0500 02/07/20 0513 02/08/20 0355  Weight: 79 kg 78.9 kg 79.1 kg    Physical Examination:    General: Appears in no acute distress  Cardiovascular: S1-S2, regular  Respiratory: Clear to auscultation bilaterally  Abdomen: Abdomen is soft, nontender, no organomegaly  Extremities: No edema in the lower extremities  Neurologic: Alert, not following commands, nonverbal    Data Reviewed:   Recent Results (from the past 240 hour(s))  Culture, Urine     Status: Abnormal   Collection Time: 01/31/20  5:17 PM   Specimen: Urine, Clean Catch  Result Value Ref [place] Status   Specimen Description URINE, CLEAN CATCH  Final   Special Requests   Final    NONE Performed at [facility], [place]  [address], [place], [place] [address]    Culture MULTIPLE SPECIES PRESENT, SUGGEST RECOLLECTION (A)  Final   Report Status 02/02/2020 FINAL  Final    No results for input(s): LIPASE, AMYLASE in the last 168 hours. Recent Labs  Lab 02/02/20 0916  AMMONIA 25       [place]   [facility] If 7PM-7AM, please contact night-coverage at [email or web address], Office  [phone]   02/08/2020, 7:27 PM  LOS: 26 days

## 2020-02-08 NOTE — Plan of Care (Signed)

## 2020-02-08 NOTE — Progress Notes (Signed)
Daily Progress Note   Patient Name: Miranda Roman       Date: 02/08/2020 DOB: 09-02-1945  Age: 74 y.o. MRN#: [id number] Attending Physician: Oswald Hillock, MD Primary Care Physician: Glendale Chard, MD Admit Date: 01/13/2020   HPI/Patient Profile:73 y.o.femalewith past medical history of DM type 2, hypertension, chronic kidney disease stage IV, B thalasemia, and prior bariatric surgery. She was transferred to [facility] from [facility] 7/9/2021with encephalopathy of unclear origin.Patient was admitted to [facility] 12/20/19 while traveling with initial presentation of chest pain. Cardiac work-up revealed only moderate pulmonary hypertension. Her hospital course was complicated by sudden onset of AMS, fever, and leukocytosis. ID and neurology were consulted and the patient underwent EEG, MRI of the brain, and a lumbar puncture, all of which were unrevealing. Her case was further complicated by anemia, thrombocytopenia, and acute kidney injury with consideration of possible TTP. Bone marrow biopsy was done. She was treated with 5 rounds of plasmapheresis and steroids without improvement. At the request of the family, patient was transferred to [facility]. Work-up at [facility] included negative continuous EEG, negative MRI brain, and negative repeat LP. She also had a repeat course of IVIG x 5 days.  Subjective: Patient is sleeping, opens her eyes briefly to voice but quickly goes back to sleep.   I spoke with daughter Miranda Roman by phone. She reports her mother has continued to make progress, although slowly. She reports her vocabulary has increased since last week, her words are more clear, and she has been able to communicate some of her basic needs. We discussed the current challenge of increasing her po  intake. Miranda Roman shares with me that she has seen improvement with her intake since the NG tube was removed and her diet was changed to regular consistency. Family is working hard to encourage patient and help meet her calorie requirements in order to avoid a J-tube. Miranda Roman shares that her mother seems more engaged when she is comfortable. She is concerned that she is continuing to have pain. Miranda Roman expresses she does not want to utilize opioids for pain control due to sedating effects.  Discussed that the attending MD is re-starting tylenol (low dose) with plan to closely monitor liver function. Also discussed that I could change venlafaxine to duloxetine now that she was able to swallow pills. Duloxetine is  preferred for treating chronic pain, but was not started initially because it can't be crushed and administered through a tube.   Length of Stay: 26  Current Medications: Scheduled Meds:  . (feeding supplement) PROSource Plus  30 mL Oral BID BM  . amLODipine  10 mg Per Tube Daily  . chlorhexidine  15 mL Mouth Rinse BID  . feeding supplement (ENSURE ENLIVE)  237 mL Oral TID BM  . heparin  5,000 Units Subcutaneous Q8H  . insulin aspart  0-15 Units Subcutaneous TID WC  . insulin aspart  4 Units Subcutaneous TID AC & HS  . insulin detemir  12 Units Subcutaneous BID  . lidocaine  1 patch Transdermal Q24H  . mouth rinse  15 mL Mouth Rinse q12n4p  . metoprolol tartrate  100 mg Per Tube BID  . pantoprazole sodium  40 mg Per Tube Daily  . sennosides  5 mL Per Tube BID  . thiamine  100 mg Per Tube Daily  . venlafaxine  25 mg Per Tube BID    Continuous Infusions: . dextrose 5 % and 0.45% NaCl 50 mL/hr at 02/07/20 1737    PRN Meds: acetaminophen, docusate, labetalol, polyethylene glycol, senna-docusate  Physical Exam Vitals reviewed.  Constitutional:      General: She is not in acute distress. Pulmonary:     Effort: Pulmonary effort is normal.  Skin:    General: Skin is warm and dry.              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 Per neurology, Wernicke Encephalopathy is the more likely differential at this point.Patient is showing continued slow improvement.  Recommendations/Plan: - start duloxetine (CYMBALTA) 30 mg daily - D/C venlafaxine - agree with tylenol 325 mg every 6 hours as needed - family hopeful for continued improvement - I will follow-up with patient and family when I am back on service 02/11/20  Goals of Care and Additional Recommendations: Limitations on Scope of Treatment: Full Scope Treatment  Code Status: Full code  Prognosis:  Unable to determine  Discharge Planning: To Be Determined   Thank you for allowing the Palliative Medicine Team to assist in the care of this patient.   Total Time 25 minutes Prolonged Time Billed  no       Greater than 50%  of this time was spent counseling and coordinating care related to the above assessment and plan.  Lavena Bullion, NP  Please contact Palliative Medicine Team phone at [phone] for questions and concerns.

## 2020-02-09 ENCOUNTER — Inpatient Hospital Stay (HOSPITAL_COMMUNITY): Payer: Medicare Other

## 2020-02-09 LAB — COMPREHENSIVE METABOLIC PANEL
ALT: 181 U/L — ABNORMAL HIGH (ref 0–44)
AST: 41 U/L (ref 15–41)
Albumin: 1.9 g/dL — ABNORMAL LOW (ref 3.5–5.0)
Alkaline Phosphatase: 100 U/L (ref 38–126)
Anion gap: 6 (ref 5–15)
BUN: 46 mg/dL — ABNORMAL HIGH (ref 8–23)
CO2: 25 mmol/L (ref 22–32)
Calcium: 8.4 mg/dL — ABNORMAL LOW (ref 8.9–10.3)
Chloride: 106 mmol/L (ref 98–111)
Creatinine, Ser: 1.54 mg/dL — ABNORMAL HIGH (ref 0.44–1.00)
GFR calc Af Amer: 39 mL/min — ABNORMAL LOW (ref >60)
GFR calc non Af Amer: 33 mL/min — ABNORMAL LOW (ref >60)
Glucose, Bld: 84 mg/dL (ref 70–99)
Potassium: 4.5 mmol/L (ref 3.5–5.1)
Sodium: 137 mmol/L (ref 135–145)
Total Bilirubin: 0.4 mg/dL (ref 0.3–1.2)
Total Protein: 5.8 g/dL — ABNORMAL LOW (ref 6.5–8.1)

## 2020-02-09 LAB — CBC
HCT: 25.7 % — ABNORMAL LOW (ref 36.0–46.0)
Hemoglobin: 8 g/dL — ABNORMAL LOW (ref 12.0–15.0)
MCH: 26.4 pg (ref 26.0–34.0)
MCHC: 31.1 g/dL (ref 30.0–36.0)
MCV: 84.8 fL (ref 80.0–100.0)
Platelets: 266 10*3/uL (ref 150–400)
RBC: 3.03 MIL/uL — ABNORMAL LOW (ref 3.87–5.11)
RDW: 16.3 % — ABNORMAL HIGH (ref 11.5–15.5)
WBC: 7.2 10*3/uL (ref 4.0–10.5)
nRBC: 0 % (ref 0.0–0.2)

## 2020-02-09 LAB — HEPATITIS PANEL, ACUTE
HCV Ab: NONREACTIVE
Hep A IgM: NONREACTIVE
Hep B C IgM: NONREACTIVE
Hepatitis B Surface Ag: NONREACTIVE

## 2020-02-09 LAB — GLUCOSE, CAPILLARY
Glucose-Capillary: 74 mg/dL (ref 70–99)
Glucose-Capillary: 75 mg/dL (ref 70–99)
Glucose-Capillary: 143 mg/dL — ABNORMAL HIGH (ref 70–99)
Glucose-Capillary: 189 mg/dL — ABNORMAL HIGH (ref 70–99)
Glucose-Capillary: 170 mg/dL — ABNORMAL HIGH (ref 70–99)

## 2020-02-09 MED ORDER — AMLODIPINE BESYLATE 10 MG PO TABS
10.0000 mg | ORAL_TABLET | Freq: Every day | ORAL | Status: DC
  Administered 2020-02-09 – 2020-02-15 (×7): 10 mg via ORAL
  Filled 2020-02-09 (×6): qty 1

## 2020-02-09 MED ORDER — SENNOSIDES-DOCUSATE SODIUM 8.6-50 MG PO TABS: 2.0000 | ORAL_TABLET | Freq: Every evening | ORAL | Status: DC | PRN

## 2020-02-09 MED ORDER — POLYETHYLENE GLYCOL 3350 17 G PO PACK: 17.0000 g | PACK | Freq: Every day | ORAL | Status: DC | PRN

## 2020-02-09 MED ORDER — DOCUSATE SODIUM 100 MG PO CAPS: 100.0000 mg | ORAL_CAPSULE | Freq: Two times a day (BID) | ORAL | Status: DC | PRN

## 2020-02-09 MED ORDER — SENNA 8.6 MG PO TABS
1.0000 | ORAL_TABLET | Freq: Two times a day (BID) | ORAL | Status: DC
  Administered 2020-02-09 – 2020-02-13 (×8): 8.6 mg via ORAL
  Filled 2020-02-09 (×8): qty 1

## 2020-02-09 MED ORDER — THIAMINE HCL 100 MG PO TABS
100.0000 mg | ORAL_TABLET | Freq: Every day | ORAL | Status: DC
  Administered 2020-02-09 – 2020-02-15 (×7): 100 mg via ORAL
  Filled 2020-02-09 (×6): qty 1

## 2020-02-09 MED ORDER — PANTOPRAZOLE SODIUM 40 MG PO TBEC
40.0000 mg | DELAYED_RELEASE_TABLET | Freq: Every day | ORAL | Status: DC
  Administered 2020-02-09 – 2020-02-15 (×7): 40 mg via ORAL
  Filled 2020-02-09 (×7): qty 1

## 2020-02-09 MED ORDER — METOPROLOL TARTRATE 50 MG PO TABS
100.0000 mg | ORAL_TABLET | Freq: Two times a day (BID) | ORAL | Status: DC
  Administered 2020-02-09 – 2020-02-15 (×13): 100 mg via ORAL
  Filled 2020-02-09 (×14): qty 2

## 2020-02-09 NOTE — Progress Notes (Signed)
Calorie Count Note  48 hour calorie count ordered.  Diet: regular Supplements: Ensure Enlive TID, Prosource Plus BID, Magic cup TID with meals, each supplement provides 290 kcal and 9 grams of protein  Spoke with pt and son at bedside. Pt is much more alert from previous encounter with this RD on 01/25/20 (she is able to answer simple questions). Per pt son, they are very diligent with oral intake as they would like to avoid a feeding tube if possible. Pt has been consuming regular diet texture well and amenable to continue regular diet for the widest variety of food options. Family tries to ensure that pt consumes 3 Ensure supplements daily, if nothing else.   Observed breakfast meal tray- pt consumed about 75% of Ensure, a few bites of Magic cup, 50% of eggs, and 50% of sausage. Pt son reports pt had a bad day yesterday due to multiple distractions and thinks she will eat even better today.   Day 1 Results:  Breakfast: no meal documentation available Lunch: 112 kcals, 6 grams of protein Dinner: 57 kcals, 2 grams of protein Supplements: 3 Ensure Enlive, 61m Prosource Plus   Total intake: 1419 kcal (70% of minimum estimated needs)  98 protein (100% of minimum estimated needs)  Day 2 Results: Breakfast: 200 kcals, 6 grams of protein Lunch/ Dinner:/ Supplements: 920 kcals, 53 grams proten  Total intake: 1120 kcal (56% of minimum estimated needs)  59 grams protein (62% of minimum estimated needs)  Average Total intake: 1270 kcal (64% of minimum estimated needs)  79 grams protein (83% of minimum estimated needs)   Nutrition Dx: Inadequate oral intakerelated to inability to eatas evidenced by NPO status; progressing- now on regular diet  Goal: Patient will meet greater than or equal to 90% of their needs; progressing   Intervention:   -Continue calorie count for one more day -Continue liberalized diet of regular -Continue Ensure Enlive po TID, each supplement provides 350  kcal and 20 grams of protein -Continue 331mProsource Plus po BID, each supplements provides 100 kcal and 15 grams of protein -Continue Magic cup TID with meals, each supplement provides 290 kcal and 9 grams of protein   JeLoistine ChanceRD, LDN, CDEast Waterfordegistered Dietitian II Certified Diabetes Care and Education Specialist Please refer to [facility] RD and/or RD on-call/weekend/after hours pager

## 2020-02-09 NOTE — Progress Notes (Addendum)
[facility]  PROGRESS NOTE  BLONDIE RIGGSBEE GYJ:[id number] DOB: 12-Aug-1945 DOA: 01/13/2020 PCP: Glendale Chard, MD   Brief HPI:   74 year old female with a history of diabetes mellitus type 2, CKD stage IV, Remington trait, beta thalassemia, prior bariatric surgery was transferred to [facility] from [place] where she originally presented on 12/21/2019 with complaints of chest pain.  VQ scan and Dopplers were negative at that time.  Cardiac work-up revealed only moderate pulmonary hypertension.  Her hospital course was complicated by sudden onset of encephalopathy, leukocytosis and fever.  ID and neurology were consulted and patient underwent MRI of the brain and lumbar puncture.  Both were unrevealing.  EEG was also unrevealing.  Her case was complicated by anemia, thrombocytopenia, acute kidney injury with consideration being given to be TTP.  She was treated with 5 rounds of plasmapheresis and steroids without improvement.  Adam TS 13 was also normal.  Bone marrow biopsy was performed.  At the request of family she was transferred to [facility].  Work-up after she arrived: Included negative long-term EEG, negative MRI brain, negative repeat LP.  She had repeat course of IVIG x5 days which is now complete. For the past 1 week patient has continued to improve minimally daily and has been able to pass speech evaluation with advancing diet.  She is currently unable to keep up with caloric and fluid needs and is having tube feeding and free water flushes overnight via NG tube.  NG tube was removed on 02/07/2020 per family request and they wish to advance patient's diet and hope that removal of NG will assist in her swallowing and speech.  General surgery is on standby for likely laparoscopic feeding tube placement on 02/09/2020 if patient unable to take enough by mouth.    Subjective   Patient seen and examined, more responsive to questions today.   Assessment/Plan:     1. Acute metabolic  encephalopathy-likely Wernicke's versus autoimmune encephalopathy.  Patient mental status is waxing and waning.  Feeding tube was removed on 02/07/2020 and the hope that patient will start taking by mouth.  If patient unable to take 80% better of daily calories and recommended food intake she will need laparoscopic feeding tube placement per general surgery.  Lumbar puncture on 12/23/2019 was negative for infection.  Repeat lumbar puncture on 01/16/2020 was again unremarkable.  B12, folate, iron studies, MMA, TSH, ammonia, autoimmune studies, heavy metal  screen, Lyme disease, arbovirus, West Nile virus studies unremarkable.  EEG negative for seizure activity.  MRI brain on 01/15/2020 is negative for acute findings.  Patient was previously on [place].  Neurology on 01/25/2020, recommended discontinuing or tapering Keppra and monitoring closely for seizures.  Patient completed 5 days of IVIG which was started on 01/16/2020.  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  Recommended to continue supportive care and follow-up on CSF autoimmune encephalitis panel paraneoplastic panel.  Sent out on 02/07/2020. 2. Transaminitis-patient has significant elevated LFTs with AST and ALT.  Unclear etiology.  LFTs are slowly improving.  Hepatitis panel is negative, abdominal ultrasound shows cholelithiasis without significant abnormality.   Low-dose Tylenol has been started for pain as patient family wants to avoid opioids.  Follow LFTs in a.m. 3. Moderate protein calorie malnutrition dysphagia-slowly improving.  Due to history of gastric bypass, either GI or IR able to assist with feeding tube placement.  Surgery has been consulted for laparoscopic feeding tube placement.  Cortrack tube has been removed.  Patient on p.o. diet.  Continue calorie count.  Registered dietitian following. 4. Diabetes mellitus type 2-patient normally uses insulin pump at baseline.  Hemoglobin A1c is 9.1.  Continue Levemir, sliding scale  insulin NovoLog.  CBG monitoring. 5. CKD stage IV-patient baseline creatinine 2.4 per chart review.  Today creatinine 1.54. 6. L1-L5 vertebral body/sacral sclerosis-noted per hematology at [facility].  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 7. Hypertension -continue amlodipine, metoprolol. 8. Asymptomatic bacteriuria-urine culture grew multiple species. 9. Goals of care-palliative care consulted    Scheduled medications:   . (feeding supplement) PROSource Plus  30 mL Oral BID BM  . amLODipine  10 mg Oral Daily  . chlorhexidine  15 mL Mouth Rinse BID  . DULoxetine  30 mg Oral Daily  . feeding supplement (ENSURE ENLIVE)  237 mL Oral TID BM  . heparin  5,000 Units Subcutaneous Q8H  . insulin aspart  0-15 Units Subcutaneous TID WC  . insulin aspart  4 Units Subcutaneous TID AC & HS  . insulin detemir  12 Units Subcutaneous BID  . lidocaine  1 patch Transdermal Q24H  . mouth rinse  15 mL Mouth Rinse q12n4p  . metoprolol tartrate  100 mg Oral BID  . pantoprazole  40 mg Oral Daily  . senna  1 tablet Oral BID  . thiamine  100 mg Oral Daily         CBG: Recent Labs  Lab 02/08/20 1724 02/08/20 2108 02/09/20 0307 02/09/20 0616 02/09/20 1134  GLUCAP 130* 254* 74 75 143*    SpO2: 100 %    CBC: Recent Labs  Lab 02/05/20 0232 02/05/20 1019 02/08/20 0349 02/09/20 0251  WBC 8.1  --  8.1 7.2  HGB 7.8* 9.3* 8.7* 8.0*  HCT 25.5* 30.8* 27.6* 25.7*  MCV 89.2  --  84.4 84.8  PLT 285  --  236 094    Basic Metabolic Panel: Recent Labs  Lab 02/03/20 0513 02/05/20 0232 02/07/20 0320 02/08/20 0349 02/09/20 0251  NA 148* 140 139 136 137  K 4.4 5.0 4.5 4.9 4.5  CL 116* 110 107 105 106  CO2 _0 19* 25  GLUCOSE 104* 127* 76 132* 84  BUN 51* 65* 59* 51* 46*  CREATININE 1.73* 1.62* 1.44* 1.54* 1.54*  CALCIUM 8.7* 8.0* 8.3* 8.3* 8.4*     Liver Function Tests: Recent Labs  Lab 02/03/20 0513 02/05/20 0232 02/08/20 0349 02/09/20 0251  AST 265* 152* 63* 41  ALT 918*  508* 244* 181*  ALKPHOS 149* 131* 93 100  BILITOT 0.6 0.2* 0.4 0.4  PROT 7.3 5.8* 5.9* 5.8*  ALBUMIN 2.1* 2.0* 1.9* 1.9*     Antibiotics: Anti-infectives (From admission, onward)   None       DVT prophylaxis: SCDs  Code Status: Full code  Family Communication: No family at bedside    Status is: Inpatient  Dispo: The patient is from: Home              Anticipated d/c is to: Skilled nursing facility              Anticipated d/c date is: 02/13/20              Patient currently not medically stable for discharge  Barrier to discharge-awaiting feeding tube placement by surgery     Consultants:  General surgery  Neurology  Procedures:  EEG  Lumbar puncture  Cortrack feeding tube   Objective   Vitals:   02/08/20 1947 02/08/20 2342 02/09/20 0411 02/09/20 1201  BP: (!) 116/52 (!) 106/56 132/61 (!) 114/59  Pulse: 83 76 87 83  Resp: _0 Temp: 98.3 F (36.8 C) 98.8 F (37.1 C) 98.5 F (36.9 C) (!) 97.5 F (36.4 C)  TempSrc: Oral Oral Oral Oral  SpO2: 100% 95% 100% 100%  Weight:   82.1 kg     Intake/Output Summary (Last 24 hours) at 02/09/2020 1358 Last data filed at 02/09/2020 8295 Gross per 24 hour  Intake 2483.87 ml  Output 1600 ml  Net 883.87 ml    08/03 1901 - 08/05 0700 In: 2720.9 [P.O.:237; I.V.:2483.9] Out: 2500 [Urine:2500]  Filed Weights   02/07/20 0513 02/08/20 0355 02/09/20 0411  Weight: 78.9 kg 79.1 kg 82.1 kg    Physical Examination:   General-appears in no acute distress Heart-S1-S2, regular, no murmur auscultated Lungs-clear to auscultation bilaterally, no wheezing or crackles auscultated Abdomen-soft, nontender, no organomegaly Extremities-no edema in the lower extremities Neuro-alert, oriented to self and place, intermittently follows commands   Data Reviewed:   Recent Results (from the past 240 hour(s))  Culture, Urine     Status: Abnormal   Collection Time: 01/31/20  5:17 PM   Specimen: Urine, Clean Catch   Result Value Ref Range Status   Specimen Description URINE, CLEAN CATCH  Final   Special Requests   Final    NONE Performed at [facility], [place] [address] [place], [place] [address]    Culture MULTIPLE SPECIES PRESENT, SUGGEST RECOLLECTION (A)  Final   Report Status 02/02/2020 FINAL  Final       Oswald Hillock   [facility] If 7PM-7AM, please contact night-coverage at [email or web address], Office  [phone]   02/09/2020, 1:58 PM  LOS: 27 days

## 2020-02-09 NOTE — Progress Notes (Signed)
IP rehab admissions - I met with son at the bedside and spoke with daughter by phone.  Patient is not doing enough with therapies to be able to tolerate an intense rehab program.  She is requiring total assist at this time.  She is most appropriate for SNF level therapies at her current functional level.  I will follow progress for now.  Call me for questions.  [phone]

## 2020-02-10 LAB — GLUCOSE, CAPILLARY
Glucose-Capillary: 63 mg/dL — ABNORMAL LOW (ref 70–99)
Glucose-Capillary: 65 mg/dL — ABNORMAL LOW (ref 70–99)
Glucose-Capillary: 150 mg/dL — ABNORMAL HIGH (ref 70–99)
Glucose-Capillary: 110 mg/dL — ABNORMAL HIGH (ref 70–99)
Glucose-Capillary: 188 mg/dL — ABNORMAL HIGH (ref 70–99)
Glucose-Capillary: 181 mg/dL — ABNORMAL HIGH (ref 70–99)

## 2020-02-10 LAB — AMMONIA: Ammonia: 35 umol/L (ref 9–35)

## 2020-02-10 LAB — MRSA PCR SCREENING: MRSA by PCR: NEGATIVE

## 2020-02-10 MED ORDER — DEXTROSE 50 % IV SOLN
12.5000 g | INTRAVENOUS | Status: AC
  Administered 2020-02-10: 12.5 g via INTRAVENOUS

## 2020-02-10 MED ORDER — DEXTROSE 50 % IV SOLN
INTRAVENOUS | Status: AC
  Filled 2020-02-10: qty 50

## 2020-02-10 NOTE — Progress Notes (Signed)
[facility]  PROGRESS NOTE  Miranda Roman QIW:[id number] DOB: 01/20/1946 DOA: 01/13/2020 PCP: Glendale Chard, MD   Brief HPI:   74 year old female with a history of diabetes mellitus type 2, CKD stage IV, Sumpter trait, beta thalassemia, prior bariatric surgery was transferred to [facility] from [place] where she originally presented on 12/21/2019 with complaints of chest pain.  VQ scan and Dopplers were negative at that time.  Cardiac work-up revealed only moderate pulmonary hypertension.  Her hospital course was complicated by sudden onset of encephalopathy, leukocytosis and fever.  ID and neurology were consulted and patient underwent MRI of the brain and lumbar puncture.  Both were unrevealing.  EEG was also unrevealing.  Her case was complicated by anemia, thrombocytopenia, acute kidney injury with consideration being given to be TTP.  She was treated with 5 rounds of plasmapheresis and steroids without improvement.  Adam TS 13 was also normal.  Bone marrow biopsy was performed.  At the request of family she was transferred to [facility].  Work-up after she arrived: Included negative long-term EEG, negative MRI brain, negative repeat LP.  She had repeat course of IVIG x5 days which is now complete. For the past 1 week patient has continued to improve minimally daily and has been able to pass speech evaluation with advancing diet.  She is currently unable to keep up with caloric and fluid needs and is having tube feeding and free water flushes overnight via NG tube.  NG tube was removed on 02/07/2020 per family request and they wish to advance patient's diet and hope that removal of NG will assist in her swallowing and speech.  General surgery is on standby for likely laparoscopic feeding tube placement on 02/09/2020 if patient unable to take enough by mouth.    Subjective   Patient seen and examined, son at bedside.  Patient is alert, answering questions appropriately.      Assessment/Plan:     1. Acute metabolic encephalopathy-likely Wernicke's versus autoimmune encephalopathy.  Patient mental status is waxing and waning.  Feeding tube was removed on 02/07/2020 and the hope that patient will start taking by mouth.  If patient unable to take 80% better of daily calories and recommended food intake she will need laparoscopic feeding tube placement per general surgery.  Lumbar puncture on 12/23/2019 was negative for infection.  Repeat lumbar puncture on 01/16/2020 was again unremarkable.  B12, folate, iron studies, MMA, TSH, ammonia, autoimmune studies, heavy metal  screen, Lyme disease, arbovirus, West Nile virus studies unremarkable.  EEG negative for seizure activity.  MRI brain on 01/15/2020 is negative for acute findings.  Patient was previously on Short.  Neurology on 01/25/2020, recommended discontinuing or tapering Keppra and monitoring closely for seizures.  Patient completed 5 days of IVIG which was started on 01/16/2020.  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  Recommended to continue supportive care and follow-up on CSF autoimmune encephalitis panel paraneoplastic panel.  Sent out on 02/07/2020. 2. Transaminitis-patient has significant elevated LFTs with AST and ALT.  Unclear etiology.  LFTs are slowly improving.  Hepatitis panel is negative, abdominal ultrasound shows cholelithiasis without significant abnormality.   Low-dose Tylenol has been started for pain as patient family wants to avoid opioids.  Follow LFTs in a.m. 3. Moderate protein calorie malnutrition dysphagia-slowly improving.  Due to history of gastric bypass, either GI or IR able to assist with feeding tube placement.  Surgery was consulted for laparoscopic feeding tube placement.    Cortrack tube has  been removed.  Patient on p.o. diet.  Continue calorie count.  Registered dietitian following.  Family has decided to continue with p.o. diet only.  No feeding tube at this time. 4. Diabetes  mellitus type 2-patient normally uses insulin pump at baseline.  Hemoglobin A1c is 9.1.  Continue Levemir, sliding scale insulin NovoLog.  CBG monitoring. 5. CKD stage IV-patient baseline creatinine 2.4 per chart review.  Today creatinine 1.54. 6. L1-L5 vertebral body/sacral sclerosis-noted per hematology at [facility].  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 7. Hypertension -continue amlodipine, metoprolol. 8. Asymptomatic bacteriuria-urine culture grew multiple species. 9. Goals of care-palliative care consulted    Scheduled medications:   . (feeding supplement) PROSource Plus  30 mL Oral BID BM  . amLODipine  10 mg Oral Daily  . chlorhexidine  15 mL Mouth Rinse BID  . dextrose      . DULoxetine  30 mg Oral Daily  . feeding supplement (ENSURE ENLIVE)  237 mL Oral TID BM  . insulin aspart  0-15 Units Subcutaneous TID WC  . insulin aspart  4 Units Subcutaneous TID AC & HS  . insulin detemir  12 Units Subcutaneous BID  . lidocaine  1 patch Transdermal Q24H  . mouth rinse  15 mL Mouth Rinse q12n4p  . metoprolol tartrate  100 mg Oral BID  . pantoprazole  40 mg Oral Daily  . senna  1 tablet Oral BID  . thiamine  100 mg Oral Daily         CBG: Recent Labs  Lab 02/09/20 2125 02/10/20 0626 02/10/20 0629 02/10/20 0653 02/10/20 1114  GLUCAP 170* 65* 63* 150* 110*    SpO2: 100 %    CBC: Recent Labs  Lab 02/05/20 0232 02/05/20 1019 02/08/20 0349 02/09/20 0251  WBC 8.1  --  8.1 7.2  HGB 7.8* 9.3* 8.7* 8.0*  HCT 25.5* 30.8* 27.6* 25.7*  MCV 89.2  --  84.4 84.8  PLT 285  --  236 419    Basic Metabolic Panel: Recent Labs  Lab 02/05/20 0232 02/07/20 0320 02/08/20 0349 02/09/20 0251  NA 140 139 136 137  K 5.0 4.5 4.9 4.5  CL 110 107 105 106  CO2 24 24 19* 25  GLUCOSE 127* 76 132* 84  BUN 65* 59* 51* 46*  CREATININE 1.62* 1.44* 1.54* 1.54*  CALCIUM 8.0* 8.3* 8.3* 8.4*     Liver Function Tests: Recent Labs  Lab 02/05/20 0232 02/08/20 0349 02/09/20 0251   AST 152* 63* 41  ALT 508* 244* 181*  ALKPHOS 131* 93 100  BILITOT 0.2* 0.4 0.4  PROT 5.8* 5.9* 5.8*  ALBUMIN 2.0* 1.9* 1.9*     Antibiotics: Anti-infectives (From admission, onward)   None       DVT prophylaxis: SCDs  Code Status: Full code  Family Communication: No family at bedside    Status is: Inpatient  Dispo: The patient is from: Home              Anticipated d/c is to: Skilled nursing facility              Anticipated d/c date is: 02/13/20              Patient currently not medically stable for discharge  Barrier to discharge-awaiting feeding tube placement by surgery     Consultants:  General surgery  Neurology  Procedures:  EEG  Lumbar puncture  Cortrack feeding tube   Objective   Vitals:   02/10/20 0311 02/10/20  0329 02/10/20 0740 02/10/20 1112  BP: 135/71  130/68 (!) 124/57  Pulse: 80  80 83  Resp: _0 Temp: 98.5 F (36.9 C)  98.6 F (37 C) 97.8 F (36.6 C)  TempSrc: Oral  Oral Oral  SpO2: 99%  100% 100%  Weight:  80.3 kg      Intake/Output Summary (Last 24 hours) at 02/10/2020 1455 Last data filed at 02/10/2020 0936 Gross per 24 hour  Intake 510.38 ml  Output 400 ml  Net 110.38 ml    08/04 1901 - 08/06 0700 In: 2994.3 [I.V.:2994.3] Out: 1600 [[id number]]  Filed Weights   02/08/20 0355 02/09/20 0411 02/10/20 0329  Weight: 79.1 kg 82.1 kg 80.3 kg    Physical Examination:   General-appears in no acute distress Heart-S1-S2, regular, no murmur auscultated Lungs-clear to auscultation bilaterally, no wheezing or crackles auscultated Abdomen-soft, nontender, no organomegaly Extremities-no edema in the lower extremities Neuro-alert, oriented to self, place.  Intermittently follows commands.   Data Reviewed:   Recent Results (from the past 240 hour(s))  Culture, Urine     Status: Abnormal   Collection Time: 01/31/20  5:17 PM   Specimen: Urine, Clean Catch  Result Value Ref Range Status   Specimen Description  URINE, CLEAN CATCH  Final   Special Requests   Final    NONE Performed at [facility], [address] [place], [place] [address]    Culture MULTIPLE SPECIES PRESENT, SUGGEST RECOLLECTION (A)  Final   Report Status 02/02/2020 FINAL  Final  MRSA PCR Screening     Status: None   Collection Time: 02/10/20  5:22 AM   Specimen: Nasal Mucosa; Nasopharyngeal  Result Value Ref Range Status   MRSA by PCR NEGATIVE NEGATIVE Final    Comment:        The GeneXpert MRSA Assay (FDA approved for NASAL specimens only), is one component of a comprehensive MRSA colonization surveillance program. It is not intended to diagnose MRSA infection nor to guide or monitor treatment for MRSA infections. Performed at [facility], [place] [address], [place], [place] [address]       [facility] If 7PM-7AM, please contact night-coverage at [email or web address], Office  [phone]   02/10/2020, 2:55 PM  LOS: 28 days

## 2020-02-10 NOTE — TOC Progression Note (Signed)
Transition of Care [facility]) - Progression Note    Patient Details  Name: Miranda Roman MRN: [id number] Date of Birth: 1946/03/15  Transition of Care [facility]) CM/SW [place], [place] Phone Number: 02/10/2020, 3:47 PM  Clinical Narrative:   CSW updated by MD that patient will not have a feeding tube placed, plan is to continue diet. CSW spoke with [employer] on update, and sent referral again. [facility] wants to continue to see how patient eats over the weekend and follow back on Monday; concerned that she may not be eating enough. CSW met with daughter to update, and she also asked about [employer] CSW sent referral and asked Miquel Dunn to review; awaiting response.     Expected Discharge Plan: Skilled Nursing Facility Barriers to Discharge: Ship broker, Continued Medical Work up  Expected Discharge Plan and Services Expected Discharge Plan: [place] Choice: [place] arrangements for the past 2 months: Single Family Home                                       Social Determinants of Health (SDOH) Interventions    Readmission Risk Interventions No flowsheet data found.

## 2020-02-10 NOTE — Progress Notes (Signed)
Calorie Count Note  48 hour calorie count ordered.  Diet: regular Supplements: Ensure Enlive TID, Prosource Plus BID, Magic cup TID with meals, each supplement provides 290 kcal and 9 grams of protein  Pt's intake continues to improve and family is extremely diligent with recording intake and encouraging pt. Pt consuming meals in addition to outside food food brought in by family ([employer]).   Day 1 Results: Breakfast:no meal documentation available Lunch:112 kcals, 6 grams of protein Dinner:57 kcals, 2 grams of protein Supplements:3 Ensure Enlive, 48m Prosource Plus  Total intake: 1419kcal (70% of minimum estimated needs) 98protein (100% of minimum estimated needs)  Day 2 Results: Breakfast: 200 kcals, 6 grams of protein Lunch/ Dinner:/ Supplements: 920 kcals, 53 grams proten  Total intake: 1120 kcal (56% of minimum estimated needs)  59 grams protein (62% of minimum estimated needs)  Day 3 Results Breakfast: 255 kcals, 7 grams protein Lunch: 769 kcals, 57 grams protein Dinner: 457 kcals, 24 grams protein  Total intake: 1481 kcal (74% of minimum estimated needs)  91 grams protein (96% of minimum estimated needs)  Average Total intake: 1340 kcal (67% of minimum estimated needs)  83 grams protein (87% of minimum estimated needs)  Nutrition Dx: Inadequate oral intakerelated to inability to eatas evidenced by NPO status; progressing- now on regular diet  Goal: Patient will meet greater than or equal to 90% of their needs; progressing   Intervention:   -D/c calorie count -Continue liberalized diet of regular -ContinueEnsure Enlive poTID, each supplement provides 350 kcal and 20 grams of protein -Continue 319mProsource Plus po BID, each supplements provides 100 kcal and 15 grams of protein -Continue Magic cup TID with meals, each supplement provides 290 kcal and 9 grams of protein  JeLoistine ChanceRD, LDN, CDTaneyvilleegistered Dietitian II Certified Diabetes  Care and Education Specialist Please refer to AMVal[facility] Surgical [facility] Incor RD and/or RD on-call/weekend/after hours pager

## 2020-02-10 NOTE — Progress Notes (Signed)
Occupational Therapy Treatment Patient Details Name: Miranda Roman MRN: [id number] DOB: [birth year] Today's Date: 02/10/2020    History of present illness Patient is a 74 year old female. 3 weeks prior she injured her foot tubing on vacation. The next day she began to have chest pain and quickly developed acute encephalopathy. She was flown from [place] (on vacation) to [place] where she lives. Workups negative with neuro looking at multifactorial encephalopathy-likely autoimmune   OT comments  Pt. Seen for skilled OT session. Focus on B UE exercises and ROM for strength and function.  Pt. Difficult to arouse for participation. Only opening eyes briefly but not able to sustain or verbalize any communication.  Grimacing noted with R digit flex./ext.  Follow Up Recommendations  LTACH    Equipment Recommendations  None recommended by OT    Recommendations for Other Services      Precautions / Restrictions Precautions Precautions: Fall Restrictions Weight Bearing Restrictions: No       Mobility Bed Mobility Overal bed mobility: Needs Assistance             General bed mobility comments: TotalAx2 to sit up in bed in chair position. Little to no trunk activation. Required max cueing to move and was not able to follow commands today.  Transfers                 General transfer comment: unable to safely attempt     Balance Overall balance assessment: Needs assistance Sitting-balance support: Bilateral upper extremity supported;Feet supported Sitting balance-Leahy Scale: Zero Sitting balance - Comments: Pt required total A to maintain unsupported sitting with bed in chair position Postural control: Right lateral lean;Posterior lean     Standing balance comment: unable to attempt                           ADL either performed or assessed with clinical judgement   ADL                                               Vision        Perception     Praxis      Cognition Arousal/Alertness: Lethargic Behavior During Therapy: Flat affect Overall Cognitive Status: Impaired/Different from baseline Area of Impairment: Following commands;Attention;Problem solving;Awareness                   Current Attention Level: Focused   Following Commands: Follows one step commands inconsistently   Awareness: Intellectual Problem Solving: Slow processing;Difficulty sequencing;Decreased initiation;Requires verbal cues;Requires tactile cues General Comments: Not able to follow commands and verbalize anything. able to track PT while walking inconsistently. Recognized saughter when she entered the room and could nod to answer her questions inconcistently.        Exercises General Exercises - Upper Extremity Shoulder Flexion: PROM;10 reps;Supine;Both Shoulder Extension: Both;PROM;10 reps;Supine Elbow Flexion: PROM;Both;Supine;10 reps Elbow Extension: PROM;Both;Supine;10 reps Wrist Flexion: PROM;Both;Supine;10 reps Wrist Extension: PROM;Both;Supine;10 reps Digit Composite Flexion: PROM;Both;Supine;10 reps Composite Extension: PROM;Both;Supine;10 reps   Shoulder Instructions       General Comments Daughter present towards end of session. Pt was able to recognize her and nod in response to her questions. She stated that she "has good days every other day typically"    Pertinent Vitals/ Pain       Pain Assessment:  Faces Faces Pain Scale: Hurts little more Pain Location: grimacing with R digit flex/ext. Pain Descriptors / Indicators: Grimacing Pain Intervention(s): Repositioned;Monitored during session;Limited activity within patient's tolerance  Home Living                                          Prior Functioning/Environment              Frequency  Min 2X/week        Progress Toward Goals  OT Goals(current goals can now be found in the care plan section)     Acute Rehab OT  Goals Patient Stated Goal: pt unable to state.  Family is hoping that she will improve and be able to engage and be as independent as possible   Plan Discharge plan remains appropriate;Frequency remains appropriate    Co-evaluation                 AM-PAC OT "6 Clicks" Daily Activity     Outcome Measure   Help from another person eating meals?: Total Help from another person taking care of personal grooming?: Total Help from another person toileting, which includes using toliet, bedpan, or urinal?: Total Help from another person bathing (including washing, rinsing, drying)?: Total Help from another person to put on and taking off regular upper body clothing?: Total Help from another person to put on and taking off regular lower body clothing?: Total 6 Click Score: 6    End of Session    OT Visit Diagnosis: Cognitive communication deficit (R41.841)   Activity Tolerance Patient limited by lethargy   Patient Left in bed;with call bell/phone within reach;with bed alarm set (PT and PT student entering room for their session)   Nurse Communication          Time: [phone] OT Time Calculation (min): 8 min  Charges: OT General Charges $OT Visit: 1 Visit OT Treatments $Therapeutic Exercise: 8-22 mins  Sonia Baller, COTA/L Acute Rehabilitation [phone]   Janice Coffin 02/10/2020, 12:41 PM

## 2020-02-10 NOTE — Progress Notes (Signed)
Physical Therapy Treatment Patient Details Name: Miranda Roman MRN: [id number] DOB: 1946-01-23 Today's Date: 02/10/2020    History of Present Illness Patient is a 74 year old female. 3 weeks prior she injured her foot tubing on vacation. The next day she began to have chest pain and quickly developed acute encephalopathy. She was flown from [place] (on vacation) to [place] where she lives. Workups negative with neuro looking at multifactorial encephalopathy-likely autoimmune    PT Comments    Patient was laying in bed, lethargic, finishing her OT session. She was placed in the chair position in bed to help alleviate any drowsiness to help her take an active role in her PT session. Pt was not responding to questions, able to nod or verbalize any words, not responsive to commands, and could not recognize her family in photographs. While in the chair position, she had a significant R and posterior lean, when trying to get her to reposition and sit upright without leaning on the bed she required TotalAx2 to hold her trunk upright. LE PROM exercises: gastrocnemius stretch, heel slides, and adductors stretch x 30 sec bilaterally. Patient was grimacing and appeared to be in pain, especially when mobilizing the RLE. She was able to recognize her daughter when she walked in the room and nod in response to two of her questions. When given a sip of unsweetened tea from her daughter, she reacted with an unhappy face, appearing in disgust from the beverage. Patient was left upright in bed with SCD's and heel lifts on, lines attached, needs within in reach, bed alarm on, and family present.     Follow Up Recommendations  SNF     Equipment Recommendations       Recommendations for Other Services       Precautions / Restrictions Precautions Precautions: Fall Restrictions Weight Bearing Restrictions: No    Mobility  Bed Mobility Overal bed mobility: Needs Assistance             General  bed mobility comments: TotalAx2 to sit up in bed in chair position. Little to no trunk activation. Required max cueing to move and was not able to follow commands today.  Transfers                 General transfer comment: unable to safely attempt   Ambulation/Gait                 Stairs             Wheelchair Mobility    Modified Rankin (Stroke Patients Only)       Balance Overall balance assessment: Needs assistance Sitting-balance support: Bilateral upper extremity supported;Feet supported Sitting balance-Leahy Scale: Zero Sitting balance - Comments: Pt required total A to maintain unsupported sitting with bed in chair position Postural control: Right lateral lean;Posterior lean     Standing balance comment: unable to attempt                            Cognition Arousal/Alertness: Lethargic Behavior During Therapy: Flat affect Overall Cognitive Status: Impaired/Different from baseline Area of Impairment: Following commands;Attention;Problem solving;Awareness                   Current Attention Level: Focused   Following Commands: Follows one step commands inconsistently   Awareness: Intellectual Problem Solving: Slow processing;Difficulty sequencing;Decreased initiation;Requires verbal cues;Requires tactile cues General Comments: Not able to follow commands and verbalize anything. able to  track PT while walking inconsistently. Recognized saughter when she entered the room and could nod to answer her questions inconcistently.      Exercises      General Comments General comments (skin integrity, edema, etc.): Daughter present towards end of session. Pt was able to recognize her and nod in response to her questions. She stated that she "has good days every other day typically"      Pertinent Vitals/Pain Pain Assessment: Faces Faces Pain Scale: Hurts little more Pain Location: grimacing with R hip flexion, R gastroc stretch,  R arm movements Pain Descriptors / Indicators: Grimacing;Discomfort;Moaning Pain Intervention(s): Repositioned;Monitored during session;Limited activity within patient's tolerance    Home Living                      Prior Function            PT Goals (current goals can now be found in the care plan section) Acute Rehab PT Goals Patient Stated Goal: pt unable to state.  Family is hoping that she will improve and be able to engage and be as independent as possible  PT Goal Formulation: With family Time For Goal Achievement: 02/18/20 Progress towards PT goals: Progressing toward goals    Frequency    Min 2X/week      PT Plan Current plan remains appropriate    Co-evaluation              AM-PAC PT "6 Clicks" Mobility   Outcome Measure  Help needed turning from your back to your side while in a flat bed without using bedrails?: Total Help needed moving from lying on your back to sitting on the side of a flat bed without using bedrails?: Total Help needed moving to and from a bed to a chair (including a wheelchair)?: Total Help needed standing up from a chair using your arms (e.g., wheelchair or bedside chair)?: Total Help needed to walk in hospital room?: Total Help needed climbing 3-5 steps with a railing? : Total 6 Click Score: 6    End of Session   Activity Tolerance: Patient tolerated treatment well Patient left: in bed;with call bell/phone within reach;with bed alarm set;with family/visitor present   PT Visit Diagnosis: Other abnormalities of gait and mobility (R26.89);Muscle weakness (generalized) (M62.81);Other symptoms and signs involving the nervous system (R29.898)     Time:  -     Charges:                        Livingston Diones, SPT, ATC

## 2020-02-10 NOTE — Progress Notes (Signed)
  Speech Language Pathology Treatment: Dysphagia;Cognitive-Linquistic  Patient Details Name: Miranda Roman MRN: [id number] DOB: 09-06-1945 Today's Date: 02/10/2020 Time: 4158-3094 SLP Time Calculation (min) (ACUTE ONLY): 13 min  Assessment / Plan / Recommendation Clinical Impression  Pt with brighter affect today, with notable increase in frequency of spontaneous responses, usually related to social language/greetings/closings, and more spontaneous smiling. Her oral intake has improved with upgrade to a regular diet, and per [name], the plan is to not proceed with enteral feeding. Pt is tolerating POs well with no concern for aspiration.  Pt was able to perform oral reading tasks today independently.  There were no delays in initiation during reading tasks. Speech was clear with normal volume.  Talked with Miranda Roman about bringing in reading material and encouraging her mother to read aloud - Miranda Roman will bring in glasses.    SLP will f/u next week. Pt is making steady progress toward goals.   HPI HPI: Patient is a 74 year old female. 3 weeks prior she injured her foot tubing on vacation. The next day she began to have chest pain and quickly developed acute encephalopathy. She was flown from [place] (on vacation) to [place] where she lives. 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      SLP Plan  Continue with current plan of care       Recommendations  Diet recommendations: Regular;Thin liquid Liquids provided via: Straw;Cup Medication Administration: Whole meds with puree Supervision: Staff to assist with self feeding Postural Changes and/or Swallow Maneuvers: Seated upright 90 degrees                Oral Care Recommendations: Oral care BID SLP Visit Diagnosis: Dysphagia, unspecified (R13.10);Cognitive communication deficit (R41.841) Plan: Continue with current plan of  care       [place]. Miranda Roman, [place] CCC/SLP Acute Rehabilitation Services Office number [phone] Pager [phone]  Miranda Roman 02/10/2020, 12:35 PM

## 2020-02-11 LAB — COMPREHENSIVE METABOLIC PANEL
ALT: 126 U/L — ABNORMAL HIGH (ref 0–44)
AST: 41 U/L (ref 15–41)
Albumin: 2.1 g/dL — ABNORMAL LOW (ref 3.5–5.0)
Alkaline Phosphatase: 98 U/L (ref 38–126)
Anion gap: 7 (ref 5–15)
BUN: 38 mg/dL — ABNORMAL HIGH (ref 8–23)
CO2: 24 mmol/L (ref 22–32)
Calcium: 8.5 mg/dL — ABNORMAL LOW (ref 8.9–10.3)
Chloride: 104 mmol/L (ref 98–111)
Creatinine, Ser: 1.52 mg/dL — ABNORMAL HIGH (ref 0.44–1.00)
GFR calc Af Amer: 39 mL/min — ABNORMAL LOW (ref >60)
GFR calc non Af Amer: 34 mL/min — ABNORMAL LOW (ref >60)
Glucose, Bld: 131 mg/dL — ABNORMAL HIGH (ref 70–99)
Potassium: 4.2 mmol/L (ref 3.5–5.1)
Sodium: 135 mmol/L (ref 135–145)
Total Bilirubin: 0.6 mg/dL (ref 0.3–1.2)
Total Protein: 6 g/dL — ABNORMAL LOW (ref 6.5–8.1)

## 2020-02-11 LAB — CBC
HCT: 27.4 % — ABNORMAL LOW (ref 36.0–46.0)
Hemoglobin: 8.5 g/dL — ABNORMAL LOW (ref 12.0–15.0)
MCH: 26.5 pg (ref 26.0–34.0)
MCHC: 31 g/dL (ref 30.0–36.0)
MCV: 85.4 fL (ref 80.0–100.0)
Platelets: 264 10*3/uL (ref 150–400)
RBC: 3.21 MIL/uL — ABNORMAL LOW (ref 3.87–5.11)
RDW: 16 % — ABNORMAL HIGH (ref 11.5–15.5)
WBC: 6.9 10*3/uL (ref 4.0–10.5)
nRBC: 0 % (ref 0.0–0.2)

## 2020-02-11 LAB — GLUCOSE, CAPILLARY
Glucose-Capillary: 87 mg/dL (ref 70–99)
Glucose-Capillary: 213 mg/dL — ABNORMAL HIGH (ref 70–99)
Glucose-Capillary: 122 mg/dL — ABNORMAL HIGH (ref 70–99)
Glucose-Capillary: 186 mg/dL — ABNORMAL HIGH (ref 70–99)

## 2020-02-11 MED ORDER — HEPARIN SODIUM (PORCINE) 5000 UNIT/ML IJ SOLN
5000.0000 [IU] | Freq: Three times a day (TID) | INTRAMUSCULAR | Status: DC
  Administered 2020-02-11 – 2020-02-15 (×13): 5000 [IU] via SUBCUTANEOUS
  Filled 2020-02-11 (×14): qty 1

## 2020-02-11 NOTE — Progress Notes (Signed)
Pt's family showed concern about the patient's abdomen being bloated. RN made aware. Additional assessment completed. No new changes from morning assessment made by RN. MD notified. RN will continue to monitor.

## 2020-02-11 NOTE — Progress Notes (Addendum)
[facility]  PROGRESS NOTE  Miranda Roman HLK:[id number] DOB: 01-07-1946 DOA: 01/13/2020 PCP: Glendale Chard, MD   Brief HPI:   74 year old female with a history of diabetes mellitus type 2, CKD stage IV, [place] trait, beta thalassemia, prior bariatric surgery was transferred to [facility] from [place] where she originally presented on 12/21/2019 with complaints of chest pain.  VQ scan and Dopplers were negative at that time.  Cardiac work-up revealed only moderate pulmonary hypertension.  Her hospital course was complicated by sudden onset of encephalopathy, leukocytosis and fever.  ID and neurology were consulted and patient underwent MRI of the brain and lumbar puncture.  Both were unrevealing.  EEG was also unrevealing.  Her case was complicated by anemia, thrombocytopenia, acute kidney injury with consideration being given to be TTP.  She was treated with 5 rounds of plasmapheresis and steroids without improvement.  Adam TS 13 was also normal.  Bone marrow biopsy was performed.  At the request of family she was transferred to [facility].  Work-up after she arrived: Included negative long-term EEG, negative MRI brain, negative repeat LP.  She had repeat course of IVIG x5 days which is now complete. For the past 1 week patient has continued to improve minimally daily and has been able to pass speech evaluation with advancing diet.  She is currently unable to keep up with caloric and fluid needs and is having tube feeding and free water flushes overnight via NG tube.  NG tube was removed on 02/07/2020 per family request and they wish to advance patient's diet and hope that removal of NG will assist in her swallowing and speech.  General surgery is on standby for likely laparoscopic feeding tube placement on 02/09/2020 if patient unable to take enough by mouth.    Subjective   Patient seen and examined, appears more alert today.  As per family she has been meeting her caloric needs.  P.o.  intake has improved.  Liver enzymes are slowly improving.   Assessment/Plan:     1. Acute metabolic encephalopathy-likely Wernicke's versus autoimmune encephalopathy.  Patient mental status is waxing and waning.  Feeding tube was removed on 02/07/2020 and the hope that patient will start taking by mouth.  If patient unable to take 80% better of daily calories and recommended food intake she will need laparoscopic feeding tube placement per general surgery.  Lumbar puncture on 12/23/2019 was negative for infection.  Repeat lumbar puncture on 01/16/2020 was again unremarkable.  B12, folate, iron studies, MMA, TSH, ammonia, autoimmune studies, heavy metal  screen, Lyme disease, arbovirus, West Nile virus studies unremarkable.  EEG negative for seizure activity.  MRI brain on 01/15/2020 is negative for acute findings.  Patient was previously on [employer].  Neurology on 01/25/2020, recommended discontinuing or tapering Keppra and monitoring closely for seizures.  Patient completed 5 days of IVIG which was started on 01/16/2020.  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  Recommended to continue supportive care and follow-up on CSF autoimmune encephalitis panel paraneoplastic panel.  Sent out on 02/07/2020. 2. Transaminitis-significantly improved, patient had significant elevated LFTs with AST and ALT.  Unclear etiology.  LFTs are slowly improving.  Hepatitis panel is negative, abdominal ultrasound shows cholelithiasis without significant abnormality.   Low-dose Tylenol has been started for pain as patient family wants to avoid opioids.  Follow LFTs in a.m. 3. Moderate protein calorie malnutrition dysphagia-slowly improving.  Due to history of gastric bypass, either GI or IR able to assist with feeding tube  placement.  Surgery was consulted for laparoscopic feeding tube placement.    Cortrack tube has been removed.  Patient on p.o. diet.  Continue calorie count.  Registered dietitian following.  Family has  decided to continue with p.o. diet only.  No feeding tube at this time. 4. Diabetes mellitus type 2-patient normally uses insulin pump at baseline.  Hemoglobin A1c is 9.1.  Continue Levemir, sliding scale insulin NovoLog.  CBG monitoring. 5. CKD stage IV-patient baseline creatinine 2.4 per chart review.  Today creatinine 1.52. 6. L1-L5 vertebral body/sacral sclerosis-noted per hematology at [facility].  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 7. Hypertension -continue amlodipine, metoprolol. 8. Asymptomatic bacteriuria-urine culture grew multiple species. 9. Goals of care-palliative care consulted    Scheduled medications:   . (feeding supplement) PROSource Plus  30 mL Oral BID BM  . amLODipine  10 mg Oral Daily  . chlorhexidine  15 mL Mouth Rinse BID  . DULoxetine  30 mg Oral Daily  . feeding supplement (ENSURE ENLIVE)  237 mL Oral TID BM  . heparin injection (subcutaneous)  5,000 Units Subcutaneous Q8H  . insulin aspart  0-15 Units Subcutaneous TID WC  . insulin aspart  4 Units Subcutaneous TID AC & HS  . insulin detemir  12 Units Subcutaneous BID  . lidocaine  1 patch Transdermal Q24H  . mouth rinse  15 mL Mouth Rinse q12n4p  . metoprolol tartrate  100 mg Oral BID  . pantoprazole  40 mg Oral Daily  . senna  1 tablet Oral BID  . thiamine  100 mg Oral Daily         CBG: Recent Labs  Lab 02/10/20 0653 02/10/20 1114 02/10/20 1628 02/10/20 2111 02/11/20 0615  GLUCAP 150* 110* 188* 181* 87    SpO2: 100 %    CBC: Recent Labs  Lab 02/05/20 0232 02/05/20 1019 02/08/20 0349 02/09/20 0251 02/11/20 0311  WBC 8.1  --  8.1 7.2 6.9  HGB 7.8* 9.3* 8.7* 8.0* 8.5*  HCT 25.5* 30.8* 27.6* 25.7* 27.4*  MCV 89.2  --  84.4 84.8 85.4  PLT 285  --  236 266 939    Basic Metabolic Panel: Recent Labs  Lab 02/05/20 0232 02/07/20 0320 02/08/20 0349 02/09/20 0251 02/11/20 0311  NA 140 139 136 137 135  K 5.0 4.5 4.9 4.5 4.2  CL 110 107 105 106 104  CO2 24 24 19* 25 24  GLUCOSE  127* 76 132* 84 131*  BUN 65* 59* 51* 46* 38*  CREATININE 1.62* 1.44* 1.54* 1.54* 1.52*  CALCIUM 8.0* 8.3* 8.3* 8.4* 8.5*     Liver Function Tests: Recent Labs  Lab 02/05/20 0232 02/08/20 0349 02/09/20 0251 02/11/20 0311  AST 152* 63* 41 41  ALT 508* 244* 181* 126*  ALKPHOS 131* 93 100 98  BILITOT 0.2* 0.4 0.4 0.6  PROT 5.8* 5.9* 5.8* 6.0*  ALBUMIN 2.0* 1.9* 1.9* 2.1*     Antibiotics: Anti-infectives (From admission, onward)   None       DVT prophylaxis: SCDs  Code Status: Full code  Family Communication: Spoke to patient sons at bedside    Status is: Inpatient  Dispo: The patient is from: Home              Anticipated d/c is to: Skilled nursing facility              Anticipated d/c date is: 02/13/20              Patient currently  not medically stable for discharge  Barrier to discharge-awaiting feeding tube placement by surgery     Consultants:  General surgery  Neurology  Procedures:  EEG  Lumbar puncture  Cortrack feeding tube   Objective   Vitals:   02/11/20 0003 02/11/20 0217 02/11/20 0347 02/11/20 0800  BP: 123/62  111/75 115/73  Pulse: 79  85 80  Resp: _0 Temp: 98.8 F (37.1 C)  98.7 F (37.1 C) 98.2 F (36.8 C)  TempSrc: Oral  Oral Oral  SpO2: 100%  98% 100%  Weight:  80.5 kg      Intake/Output Summary (Last 24 hours) at 02/11/2020 1127 Last data filed at 02/11/2020 0600 Gross per 24 hour  Intake --  Output 1525 ml  Net -1525 ml    08/05 1901 - 08/07 0700 In: -  Out: 1925 [[id number]]  Filed Weights   02/09/20 0411 02/10/20 0329 02/11/20 0217  Weight: 82.1 kg 80.3 kg 80.5 kg    Physical Examination:  General-appears in no acute distress Heart-S1-S2, regular, no murmur auscultated Lungs-clear to auscultation bilaterally, no wheezing or crackles auscultated Abdomen-soft, nontender, no organomegaly Extremities-no edema in the lower extremities Neuro-alert, oriented to self, place, follows commands  intermittently, slow to respond   Data Reviewed:   Recent Results (from the past 240 hour(s))  MRSA PCR Screening     Status: None   Collection Time: 02/10/20  5:22 AM   Specimen: Nasal Mucosa; Nasopharyngeal  Result Value Ref Range Status   MRSA by PCR NEGATIVE NEGATIVE Final    Comment:        The GeneXpert MRSA Assay (FDA approved for NASAL specimens only), is one component of a comprehensive MRSA colonization surveillance program. It is not intended to diagnose MRSA infection nor to guide or monitor treatment for MRSA infections. Performed at [facility], [place] [address] [place], [place] [address]       [facility] If 7PM-7AM, please contact night-coverage at [email or web address], Office  [phone]   02/11/2020, 11:27 AM  LOS: 29 days

## 2020-02-12 LAB — GLUCOSE, CAPILLARY
Glucose-Capillary: 83 mg/dL (ref 70–99)
Glucose-Capillary: 169 mg/dL — ABNORMAL HIGH (ref 70–99)
Glucose-Capillary: 210 mg/dL — ABNORMAL HIGH (ref 70–99)
Glucose-Capillary: 165 mg/dL — ABNORMAL HIGH (ref 70–99)
Glucose-Capillary: 117 mg/dL — ABNORMAL HIGH (ref 70–99)

## 2020-02-12 MED ORDER — DICLOFENAC SODIUM 1 % EX GEL
4.0000 g | Freq: Four times a day (QID) | CUTANEOUS | Status: DC | PRN
  Administered 2020-02-13: 4 g via TOPICAL
  Filled 2020-02-12: qty 100

## 2020-02-12 MED ORDER — DULOXETINE HCL 60 MG PO CPEP
60.0000 mg | ORAL_CAPSULE | Freq: Every day | ORAL | Status: DC
  Administered 2020-02-13 – 2020-02-15 (×3): 60 mg via ORAL
  Filled 2020-02-12 (×3): qty 1

## 2020-02-12 NOTE — Progress Notes (Signed)
Daily Progress Note   Patient Name: Miranda Roman       Date: 02/12/2020 DOB: Jun 27, 1946  Age: 74 y.o. MRN#: [id number] Attending Physician: Oswald Hillock, MD Primary Care Physician: Glendale Chard, MD Admit Date: 01/13/2020  Reason for Follow-up: continued Nora discussions and pain control  Subjective: Patient is alert, when I asked how she was doing she makes eye contact and slowly responds "fine". Daughter [name] is at bedside and reports continued slow improvement, particularly with oral intake which has remained adequate. Calorie counts are no longer required. [name] reports her mother has even shown ability to read some words.   I ask [name] if she feels patient has adequate pain control, and she replies no. She reports patient continues to verbalize pain as well as show non-verbal signs of pain. The location is back and left hip. Pain increases with activity but also occurs at rest. We discuss that the pain is likely chronic in nature, reviewing imaging. MRI lumbar spine from August 2020 shows mild degenerative disc disease and left-sided facet arthritis at L3-4, which could contribute to lower back pain. We discuss different options for pain management. [name] includes her brother [name] in this discussion by speaker phone. Family is very hesitant to utilize any opioid medications for pain control. [name] later shares with me that during the hospitalization in [place], there was significant and rapid decline in patient;s mental status after morphine was given, and this happened on 2 separate occasions. Discussed that we would continue to avoid opioids per their request, and could try alternate pain control medications.    Length of Stay: 30  Current Medications: Scheduled Meds:  . (feeding  supplement) PROSource Plus  30 mL Oral BID BM  . amLODipine  10 mg Oral Daily  . chlorhexidine  15 mL Mouth Rinse BID  . DULoxetine  30 mg Oral Daily  . feeding supplement (ENSURE ENLIVE)  237 mL Oral TID BM  . heparin injection (subcutaneous)  5,000 Units Subcutaneous Q8H  . insulin aspart  0-15 Units Subcutaneous TID WC  . insulin aspart  4 Units Subcutaneous TID AC & HS  . insulin detemir  12 Units Subcutaneous BID  . lidocaine  1 patch Transdermal Q24H  . mouth rinse  15 mL Mouth Rinse q12n4p  . metoprolol tartrate  100 mg  Oral BID  . pantoprazole  40 mg Oral Daily  . senna  1 tablet Oral BID  . thiamine  100 mg Oral Daily    Continuous Infusions: . dextrose 5 % and 0.45% NaCl 50 mL/hr at 02/12/20 0500    PRN Meds: acetaminophen, docusate sodium, labetalol, polyethylene glycol, senna-docusate  Physical Exam Vitals reviewed.  Constitutional:      General: She is not in acute distress. Pulmonary:     Effort: Pulmonary effort is normal.  Skin:    General: Skin is warm and dry.  Neurological:     Mental Status: She is alert.             Vital Signs: BP 111/60 (BP Location: Right Arm)   Pulse 84   Temp 99.3 F (37.4 C) (Oral)   Resp 18   Wt 80.4 kg   SpO2 100%   BMI 29.68 kg/m  SpO2: SpO2: 100 % O2 Device: O2 Device: Room Air O2 Flow Rate:    Intake/output summary:   Intake/Output Summary (Last 24 hours) at 02/12/2020 2335 Last data filed at 02/12/2020 1353 Gross per 24 hour  Intake 3297.48 ml  Output 2900 ml  Net 397.48 ml   LBM: Last BM Date: 02/11/20 Baseline Weight: Weight: 78.3 kg Most recent weight: Weight: 80.4 kg       Palliative Assessment/Data: 30%    Patient Active Problem List   Diagnosis Date Noted  . History of back pain   . Goals of care, counseling/discussion   . Inadequate oral nutritional intake   . Altered mental status   . Palliative care by specialist   . Pressure injury of skin 01/13/2020  . Acute encephalopathy 01/13/2020   . PAD (peripheral artery disease) ([place]) 06/06/2019  . Pancreatic cyst 06/06/2019  . Class 1 obesity due to excess calories with serious comorbidity and body mass index (BMI) of 31.0 to 31.9 in adult 06/06/2019  . Cyst of left kidney 03/10/2019  . Lumbago 02/27/2019  . Facet arthritis of lumbar region 02/27/2019  . Abnormal CT of the chest 12/28/2018  . Atrophic vaginitis 12/15/2018  . Atypical squamous cells of undetermined significance on cytologic smear of cervix (ASC-US) 12/15/2018  . Overweight with body mass index (BMI) 25.0-29.9 12/15/2018  . 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 Osteoporosis 12/15/2018  . Unspecified dyspareunia (CODE) 12/15/2018  . 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 Gastroesophageal reflux disease 08/26/2016  . Aortic valve regurgitation 02/11/2016  . Chest pain at rest 01/30/2015  . Atherosclerosis of native coronary artery of native heart without angina pectoris 12/24/2012  . Benign essential HTN 12/24/2012  . Hyperlipidemia 12/24/2012  . 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 03/10/2012  . Diabetes mellitus with stage 4 chronic kidney disease ([place]) 03/10/2012    Palliative Care Assessment & Plan   Assessment: - acute encephalopathy, likely Wernicke's - neurocognitive status slowly improving - generalized weakness - pain syndrome, acute on chronic  Recommendations/Plan: - increase duloxetine to 60 mg daily - voltaren 1% topical gel, 4 times daily as needed for lower back or hip pain - increase acetaminophen to 500 mg every 6 hours prn  Code Status: Full   Prognosis: Unable to determine  Discharge Planning: Unable to determine   Thank you for allowing the Palliative Medicine Team to assist in the care of this patient.   Total Time 35 minutes Prolonged Time Billed  no      Greater than  50%  of this time was spent counseling and coordinating care  related to the above assessment and plan.  Lavena Bullion, NP  Please contact Palliative Medicine Team phone at [phone] for questions and concerns.

## 2020-02-12 NOTE — Progress Notes (Signed)
[facility]  PROGRESS NOTE  Miranda Roman BPP:[id number] DOB: [birth year] DOA: 01/13/2020 PCP: Glendale Chard, MD   Brief HPI:   74 year old female with a history of diabetes mellitus type 2, CKD stage IV, [place] trait, beta thalassemia, prior bariatric surgery was transferred to [facility] from [place] where she originally presented on 12/21/2019 with complaints of chest pain.  VQ scan and Dopplers were negative at that time.  Cardiac work-up revealed only moderate pulmonary hypertension.  Her hospital course was complicated by sudden onset of encephalopathy, leukocytosis and fever.  ID and neurology were consulted and patient underwent MRI of the brain and lumbar puncture.  Both were unrevealing.  EEG was also unrevealing.  Her case was complicated by anemia, thrombocytopenia, acute kidney injury with consideration being given to be TTP.  She was treated with 5 rounds of plasmapheresis and steroids without improvement.  Adam TS 13 was also normal.  Bone marrow biopsy was performed.  At the request of family she was transferred to [facility].  Work-up after she arrived: Included negative long-term EEG, negative MRI brain, negative repeat LP.  She had repeat course of IVIG x5 days which is now complete. For the past 1 week patient has continued to improve minimally daily and has been able to pass speech evaluation with advancing diet.  She is currently unable to keep up with caloric and fluid needs and is having tube feeding and free water flushes overnight via NG tube.  NG tube was removed on 02/07/2020 per family request and they wish to advance patient's diet and hope that removal of NG will assist in her swallowing and speech.  General surgery is on standby for likely laparoscopic feeding tube placement on 02/09/2020 if patient unable to take enough by mouth.    Subjective   Patient seen and examined, much more alert.  Answering questions appropriately.  Eating better.  Family at  bedside.   Assessment/Plan:     1. Acute metabolic encephalopathy-likely Wernicke's versus autoimmune encephalopathy.  Patient mental status is waxing and waning.  Feeding tube was removed on 02/07/2020 and the hope that patient will start taking by mouth.  If patient unable to take 80% better of daily calories and recommended food intake she will need laparoscopic feeding tube placement per general surgery.  Lumbar puncture on 12/23/2019 was negative for infection.  Repeat lumbar puncture on 01/16/2020 was again unremarkable.  B12, folate, iron studies, MMA, TSH, ammonia, autoimmune studies, heavy metal  screen, Lyme disease, arbovirus, West Nile virus studies unremarkable.  EEG negative for seizure activity.  MRI brain on 01/15/2020 is negative for acute findings.  Patient was previously on [place].  Neurology on 01/25/2020, recommended discontinuing or tapering Keppra and monitoring closely for seizures.  Patient completed 5 days of IVIG which was started on 01/16/2020.  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  Recommended to continue supportive care and follow-up on CSF autoimmune encephalitis panel paraneoplastic panel.  Sent out on 02/07/2020. 2. Transaminitis-significantly improved, patient had significant elevated LFTs with AST and ALT.  Unclear etiology.  LFTs are slowly improving.  Hepatitis panel is negative, abdominal ultrasound shows cholelithiasis without significant abnormality.   Low-dose Tylenol has been started for pain as patient family wants to avoid opioids.  Follow LFTs in a.m. 3. Moderate protein calorie malnutrition dysphagia-slowly improving.  Due to history of gastric bypass, either GI or IR able to assist with feeding tube placement.  Surgery was consulted for laparoscopic feeding tube placement.  Cortrack tube has been removed.  Patient on p.o. diet.  Continue calorie count.  Registered dietitian following.  Family has decided to continue with p.o. diet only.  No  feeding tube at this time. 4. Diabetes mellitus type 2-patient normally uses insulin pump at baseline.  Hemoglobin A1c is 9.1.  Continue Levemir, sliding scale insulin NovoLog.  CBG monitoring. 5. CKD stage IV-patient baseline creatinine 2.4 per chart review.  Today creatinine 1.52. 6. L1-L5 vertebral body/sacral sclerosis-noted per hematology at [facility].  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 7. Hypertension -continue amlodipine, metoprolol. 8. Asymptomatic bacteriuria-urine culture grew multiple species. 9. Goals of care-palliative care consulted    Scheduled medications:   . (feeding supplement) PROSource Plus  30 mL Oral BID BM  . amLODipine  10 mg Oral Daily  . chlorhexidine  15 mL Mouth Rinse BID  . DULoxetine  30 mg Oral Daily  . feeding supplement (ENSURE ENLIVE)  237 mL Oral TID BM  . heparin injection (subcutaneous)  5,000 Units Subcutaneous Q8H  . insulin aspart  0-15 Units Subcutaneous TID WC  . insulin aspart  4 Units Subcutaneous TID AC & HS  . insulin detemir  12 Units Subcutaneous BID  . lidocaine  1 patch Transdermal Q24H  . mouth rinse  15 mL Mouth Rinse q12n4p  . metoprolol tartrate  100 mg Oral BID  . pantoprazole  40 mg Oral Daily  . senna  1 tablet Oral BID  . thiamine  100 mg Oral Daily         CBG: Recent Labs  Lab 02/11/20 0615 02/11/20 1130 02/11/20 1635 02/11/20 2110 02/12/20 0637  GLUCAP 87 213* 122* 186* 83    SpO2: 100 %    CBC: Recent Labs  Lab 02/08/20 0349 02/09/20 0251 02/11/20 0311  WBC 8.1 7.2 6.9  HGB 8.7* 8.0* 8.5*  HCT 27.6* 25.7* 27.4*  MCV 84.4 84.8 85.4  PLT 236 266 092    Basic Metabolic Panel: Recent Labs  Lab 02/07/20 0320 02/08/20 0349 02/09/20 0251 02/11/20 0311  NA 139 136 137 135  K 4.5 4.9 4.5 4.2  CL 107 105 106 104  CO2 24 19* 25 24  GLUCOSE 76 132* 84 131*  BUN 59* 51* 46* 38*  CREATININE 1.44* 1.54* 1.54* 1.52*  CALCIUM 8.3* 8.3* 8.4* 8.5*     Liver Function Tests: Recent Labs  Lab  02/08/20 0349 02/09/20 0251 02/11/20 0311  AST 63* 41 41  ALT 244* 181* 126*  ALKPHOS 93 100 98  BILITOT 0.4 0.4 0.6  PROT 5.9* 5.8* 6.0*  ALBUMIN 1.9* 1.9* 2.1*     Antibiotics: Anti-infectives (From admission, onward)   None       DVT prophylaxis: SCDs  Code Status: Full code  Family Communication: Spoke to patient sons at bedside    Status is: Inpatient  Dispo: The patient is from: Home              Anticipated d/c is to: Skilled nursing facility              Anticipated d/c date is: 02/13/20              Patient currently not medically stable for discharge  Barrier to discharge-awaiting feeding tube placement by surgery     Consultants:  General surgery  Neurology  Procedures:  EEG  Lumbar puncture  Cortrack feeding tube   Objective   Vitals:   02/11/20 2331 02/12/20 0300 02/12/20 0452 02/12/20 9574  BP: 128/66 130/83  124/63  Pulse: 80 78  77  Resp: _0 Temp: 98.2 F (36.8 C) 98.1 F (36.7 C)  (!) 97.5 F (36.4 C)  TempSrc: Oral Oral  Oral  SpO2: 100% 99%  100%  Weight:   80.4 kg     Intake/Output Summary (Last 24 hours) at 02/12/2020 1106 Last data filed at 02/12/2020 0500 Gross per 24 hour  Intake 3057.48 ml  Output 2200 ml  Net 857.48 ml    08/06 1901 - 08/08 0700 In: 3297.5 [P.O.:360; I.V.:2937.5] Out: 4125 [Urine:4125]  Filed Weights   02/10/20 0329 02/11/20 0217 02/12/20 0452  Weight: 80.3 kg 80.5 kg 80.4 kg    Physical Examination:  General-appears in no acute distress Heart-S1-S2, regular, no murmur auscultated Lungs-clear to auscultation bilaterally, no wheezing or crackles auscultated Abdomen-soft, nontender, no organomegaly Extremities-no edema in the lower extremities Neuro-alert, oriented x3, no focal deficit noted   Data Reviewed:   Recent Results (from the past 240 hour(s))  MRSA PCR Screening     Status: None   Collection Time: 02/10/20  5:22 AM   Specimen: Nasal Mucosa; Nasopharyngeal  Result  Value Ref Range Status   MRSA by PCR NEGATIVE NEGATIVE Final    Comment:        The GeneXpert MRSA Assay (FDA approved for NASAL specimens only), is one component of a comprehensive MRSA colonization surveillance program. It is not intended to diagnose MRSA infection nor to guide or monitor treatment for MRSA infections. Performed at [facility], [place] [address] [place], [place] [address]       [place] [facility] If 7PM-7AM, please contact night-coverage at [email or web address], Office  [phone]   02/12/2020, 11:06 AM  LOS: 30 days

## 2020-02-13 LAB — GLUCOSE, CAPILLARY
Glucose-Capillary: 115 mg/dL — ABNORMAL HIGH (ref 70–99)
Glucose-Capillary: 156 mg/dL — ABNORMAL HIGH (ref 70–99)
Glucose-Capillary: 176 mg/dL — ABNORMAL HIGH (ref 70–99)
Glucose-Capillary: 125 mg/dL — ABNORMAL HIGH (ref 70–99)

## 2020-02-13 MED ORDER — ACETAMINOPHEN 500 MG PO TABS
500.0000 mg | ORAL_TABLET | Freq: Four times a day (QID) | ORAL | Status: DC | PRN
  Filled 2020-02-13: qty 1

## 2020-02-13 NOTE — Progress Notes (Signed)
Physical Therapy Treatment Patient Details Name: Miranda Roman MRN: [id number] DOB: 07-16-1945 Today's Date: 02/13/2020    History of Present Illness Patient is a 74 year old female. 3 weeks prior she injured her foot tubing on vacation. The next day she began to have chest pain and quickly developed acute encephalopathy. She was flown from [place] (on vacation) to [place] where she lives. Workups negative with neuro looking at multifactorial encephalopathy-likely autoimmune    PT Comments    Pt was asleep on arrival, but aroused well at start of session.  Pt excited about getting OOB.  Pt very slow to respond to commands or questions.  Emphasis on warm up exercise, transitions to EOB, sitting balance with improvements during the session and a transfer to the chair.    Follow Up Recommendations  SNF     Equipment Recommendations  None recommended by PT    Recommendations for Other Services       Precautions / Restrictions Precautions Precautions: Fall    Mobility  Bed Mobility Overal bed mobility: Needs Assistance       Supine to sit: Total assist;+2 for physical assistance     General bed mobility comments: cued for direction, assisted up via L elbow to sitting.  assisted to scoot assymetrically to EOB  Transfers Overall transfer level: Needs assistance Equipment used: None Transfers: Squat Pivot Transfers     Squat pivot transfers: Total assist;+2 safety/equipment     General transfer comment: assisted pt with a face to face squat pivot transfer.  Little noticeable assist from pt.  Ambulation/Gait             General Gait Details: unabl;e   Stairs             Wheelchair Mobility    Modified Rankin (Stroke Patients Only)       Balance Overall balance assessment: Needs assistance Sitting-balance support: Bilateral upper extremity supported;Feet supported Sitting balance-Leahy Scale: Poor Sitting balance - Comments: Total time EOB 10  min.  After several minutes of work on assisted balance, pt able to sway post/ant of midline with guard assist.     Standing balance-Leahy Scale: Zero                              Cognition Arousal/Alertness: Awake/alert;Lethargic Behavior During Therapy: Flat affect Overall Cognitive Status: Impaired/Different from baseline Area of Impairment: Following commands;Attention;Problem solving;Awareness                   Current Attention Level: Focused;Sustained (short periods of sustained attn)   Following Commands: Follows one step commands inconsistently;Follows one step commands with increased time   Awareness: Intellectual Problem Solving: Slow processing;Difficulty sequencing        Exercises Other Exercises Other Exercises: hip/knee flex/ext ROM AA/PROM. x10 rep bil    General Comments General comments (skin integrity, edema, etc.): pt mildly tachy with sats mid 90's      Pertinent Vitals/Pain Pain Assessment: Faces Faces Pain Scale: Hurts even more Pain Location: hip/knee on the right Pain Descriptors / Indicators: Discomfort;Grimacing;Guarding;Moaning Pain Intervention(s): Monitored during session;Repositioned    Home Living                      Prior Function            PT Goals (current goals can now be found in the care plan section) Acute Rehab PT Goals Patient Stated Goal:  pt unable to state.  Family is hoping that she will improve and be able to engage and be as independent as possible  PT Goal Formulation: With family Time For Goal Achievement: 02/18/20 Progress towards PT goals: Progressing toward goals    Frequency    Min 2X/week      PT Plan Current plan remains appropriate    Co-evaluation              AM-PAC PT "6 Clicks" Mobility   Outcome Measure  Help needed turning from your back to your side while in a flat bed without using bedrails?: Total Help needed moving from lying on your back to sitting  on the side of a flat bed without using bedrails?: Total Help needed moving to and from a bed to a chair (including a wheelchair)?: Total Help needed standing up from a chair using your arms (e.g., wheelchair or bedside chair)?: Total Help needed to walk in hospital room?: Total Help needed climbing 3-5 steps with a railing? : Total 6 Click Score: 6    End of Session   Activity Tolerance: Patient tolerated treatment well Patient left: in chair;with call bell/phone within reach;with chair alarm set;with family/visitor present;Other (comment) (on maximove lift pad.) Nurse Communication: Mobility status PT Visit Diagnosis: Other abnormalities of gait and mobility (R26.89);Muscle weakness (generalized) (M62.81);Other symptoms and signs involving the nervous system (R29.898);Pain Pain - Right/Left: Left Pain - part of body:  (hip>knee)     Time: 7014-1030 PT Time Calculation (min) (ACUTE ONLY): 24 min  Charges:  $Therapeutic Exercise: 8-22 mins $Therapeutic Activity: 8-22 mins                     02/13/2020  Ginger Carne., PT Acute Rehabilitation Services [phone]  (pager) [phone]  (office)   Tessie Fass Aurel Nguyen 02/13/2020, 4:35 PM

## 2020-02-13 NOTE — Progress Notes (Signed)
Inpatient Rehabilitation Admissions Coordinator  We will sign off at this time as SNF is being pursued.  Danne Baxter, RN, MSN Rehab Admissions Coordinator [phone] 02/13/2020 3:28 PM

## 2020-02-13 NOTE — Progress Notes (Addendum)
[facility]  PROGRESS NOTE  Miranda Roman MRN:[id number] DOB: 12/28/1945 DOA: 01/13/2020 PCP: Sanders, Robyn, MD   Brief HPI:   74-year-old female with a history of diabetes mellitus type 2, CKD stage IV, [place] trait, beta thalassemia, prior bariatric surgery was transferred to [facility] from [facility] where she originally presented on 12/21/2019 with complaints of chest pain.  VQ scan and Dopplers were negative at that time.  Cardiac work-up revealed only moderate pulmonary hypertension.  Her hospital course was complicated by sudden onset of encephalopathy, leukocytosis and fever.  ID and neurology were consulted and patient underwent MRI of the brain and lumbar puncture.  Both were unrevealing.  EEG was also unrevealing.  Her case was complicated by anemia, thrombocytopenia, acute kidney injury with consideration being given to be TTP.  She was treated with 5 rounds of plasmapheresis and steroids without improvement.  Adam TS 13 was also normal.  Bone marrow biopsy was performed.  At the request of family she was transferred to [facility].  Work-up after she arrived: Included negative long-term EEG, negative MRI brain, negative repeat LP.  She had repeat course of IVIG x5 days which is now complete. For the past 1 week patient has continued to improve minimally daily and has been able to pass speech evaluation with advancing diet.  She is currently unable to keep up with caloric and fluid needs and is having tube feeding and free water flushes overnight via NG tube.  NG tube was removed on 02/07/2020 per family request and they wish to advance patient's diet and hope that removal of NG will assist in her swallowing and speech.  General surgery is on standby for likely laparoscopic feeding tube placement on 02/09/2020 if patient unable to take enough by mouth.    Subjective   Patient seen and examined, complains of itching.  She is much more alert, coherent and responding to questions very  well.   Assessment/Plan:     1. Acute metabolic encephalopathy-significantly improved, likely Wernicke's versus autoimmune encephalopathy.  Patient mental status is waxing and waning.  Feeding tube was removed on 02/07/2020 and the hope that patient will start taking by mouth.  If patient unable to take 80% better of daily calories and recommended food intake she will need laparoscopic feeding tube placement per general surgery.  Lumbar puncture on 12/23/2019 was negative for infection.  Repeat lumbar puncture on 01/16/2020 was again unremarkable.  B12, folate, iron studies, MMA, TSH, ammonia, autoimmune studies, heavy metal  screen, Lyme disease, arbovirus, West Nile virus studies unremarkable.  EEG negative for seizure activity.  MRI brain on 01/15/2020 is negative for acute findings.  Patient was previously on Keppra.  Neurology on 01/25/2020, recommended discontinuing or tapering Keppra and monitoring closely for seizures.  Patient completed 5 days of IVIG which was started on 01/16/2020.  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  Recommended to continue supportive care and follow-up on CSF autoimmune encephalitis panel paraneoplastic panel.  Sent out on 02/07/2020. 2. Transaminitis-significantly improved, patient had significant elevated LFTs with AST and ALT.  Unclear etiology.  LFTs are slowly improving.  Hepatitis panel is negative, abdominal ultrasound shows cholelithiasis without significant abnormality.   Low-dose Tylenol has been started for pain as patient family wants to avoid opioids.  Follow LFTs in a.m. 3. Moderate protein calorie malnutrition dysphagia-slowly improving.  Due to history of gastric bypass, neither GI or IR were  able to assist with feeding tube placement.  Surgery was consulted for   laparoscopic feeding tube placement.    Cortrack tube has been removed.  Patient on p.o. diet.  Continue calorie count.  Registered dietitian following.  Family has decided to continue  with p.o. diet only.  No feeding tube at this time. 4. Diabetes mellitus type 2-patient normally uses insulin pump at baseline.  Hemoglobin A1c is 9.1.  Continue Levemir, sliding scale insulin NovoLog.  CBG monitoring. 5. CKD stage IV-patient baseline creatinine 2.4 per chart review.  Today creatinine 1.52. 6. L1-L5 vertebral body/sacral sclerosis-noted per hematology at [facility].  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 7. Hypertension -continue amlodipine, metoprolol. 8. Asymptomatic bacteriuria-urine culture grew multiple species. 9. Itching-Will avoid hydroxyzine or Benadryl due to their effects on mental status.  Continue local care for itching.  Discussed with RN and patient's daughter at bedside. 10. Goals of care-palliative care consulted    Scheduled medications:   . (feeding supplement) PROSource Plus  30 mL Oral BID BM  . amLODipine  10 mg Oral Daily  . chlorhexidine  15 mL Mouth Rinse BID  . DULoxetine  60 mg Oral Daily  . feeding supplement (ENSURE ENLIVE)  237 mL Oral TID BM  . heparin injection (subcutaneous)  5,000 Units Subcutaneous Q8H  . insulin aspart  0-15 Units Subcutaneous TID WC  . insulin aspart  4 Units Subcutaneous TID AC & HS  . insulin detemir  12 Units Subcutaneous BID  . lidocaine  1 patch Transdermal Q24H  . mouth rinse  15 mL Mouth Rinse q12n4p  . metoprolol tartrate  100 mg Oral BID  . pantoprazole  40 mg Oral Daily  . senna  1 tablet Oral BID  . thiamine  100 mg Oral Daily         CBG: Recent Labs  Lab 02/12/20 1632 02/12/20 1957 02/12/20 2153 02/13/20 0630 02/13/20 1241  GLUCAP 210* 165* 117* 115* 156*    SpO2: 100 %    CBC: Recent Labs  Lab 02/08/20 0349 02/09/20 0251 02/11/20 0311  WBC 8.1 7.2 6.9  HGB 8.7* 8.0* 8.5*  HCT 27.6* 25.7* 27.4*  MCV 84.4 84.8 85.4  PLT 236 266 264    Basic Metabolic Panel: Recent Labs  Lab 02/07/20 0320 02/08/20 0349 02/09/20 0251 02/11/20 0311  NA 139 136 137 135  K 4.5 4.9 4.5 4.2   CL 107 105 106 104  CO2 24 19* 25 24  GLUCOSE 76 132* 84 131*  BUN 59* 51* 46* 38*  CREATININE 1.44* 1.54* 1.54* 1.52*  CALCIUM 8.3* 8.3* 8.4* 8.5*     Liver Function Tests: Recent Labs  Lab 02/08/20 0349 02/09/20 0251 02/11/20 0311  AST 63* 41 41  ALT 244* 181* 126*  ALKPHOS 93 100 98  BILITOT 0.4 0.4 0.6  PROT 5.9* 5.8* 6.0*  ALBUMIN 1.9* 1.9* 2.1*     Antibiotics: Anti-infectives (From admission, onward)   None       DVT prophylaxis: SCDs  Code Status: Full code  Family Communication: Spoke to patient sons at bedside    Status is: Inpatient  Dispo: The patient is from: Home              Anticipated d/c is to: Skilled nursing facility              Anticipated d/c date is: 02/15/20              Patient is medically stable for discharge  Barrier to discharge-awaiting bed placement at skilled facility       Consultants:  General surgery  Neurology  Procedures:  EEG  Lumbar puncture  Cortrack feeding tube   Objective   Vitals:   02/13/20 0350 02/13/20 0425 02/13/20 1009 02/13/20 1247  BP: (!) 126/58  (!) 124/59 122/60  Pulse: 72  84 83  Resp: _0 Temp: 98.7 F (37.1 C)  98.1 F (36.7 C) 98.9 F (37.2 C)  TempSrc: Oral  Oral Oral  SpO2: 96%  100% 100%  Weight:  80.2 kg      Intake/Output Summary (Last 24 hours) at 02/13/2020 1311 Last data filed at 02/13/2020 1249 Gross per 24 hour  Intake 360 ml  Output 1850 ml  Net -1490 ml    08/07 1901 - 08/09 0700 In: 3297.5 [P.O.:360; I.V.:2937.5] Out: 3650 [Urine:3650]  Filed Weights   02/11/20 0217 02/12/20 0452 02/13/20 0425  Weight: 80.5 kg 80.4 kg 80.2 kg    Physical Examination:  General-appears in no acute distress Heart-S1-S2, regular, no murmur auscultated Lungs-clear to auscultation bilaterally, no wheezing or crackles auscultated Abdomen-soft, nontender, no organomegaly Extremities-no edema in the lower extremities Neuro-alert, oriented x3, no focal deficit  noted   Data Reviewed:   Recent Results (from the past 240 hour(s))  MRSA PCR Screening     Status: None   Collection Time: 02/10/20  5:22 AM   Specimen: Nasal Mucosa; Nasopharyngeal  Result Value Ref Range Status   MRSA by PCR NEGATIVE NEGATIVE Final    Comment:        The GeneXpert MRSA Assay (FDA approved for NASAL specimens only), is one component of a comprehensive MRSA colonization surveillance program. It is not intended to diagnose MRSA infection nor to guide or monitor treatment for MRSA infections. Performed at [facility], [place] [address], [place], [place] [address]       [place] [facility] If 7PM-7AM, please contact night-coverage at [email or web address], Office  [phone]   02/13/2020, 1:11 PM  LOS: 31 days

## 2020-02-13 NOTE — TOC Progression Note (Signed)
Transition of Care [facility]) - Progression Note    Patient Details  Name: MEHEK GREGA MRN: [id number] Date of Birth: 03-03-1946  Transition of Care [facility]) CM/SW [place], [place] Phone Number: 02/13/2020, 3:03 PM  Clinical Narrative:   CSW met with patient's daughter, [name], at bedside to discuss bed offers. [name] would like to talk to her siblings and friend to discuss options, asked CSW to come back. CSW also discussed need for therapy updates with PT assigned, will see the patient today to fax insurance auth request.   CSW met with [name] again this afternoon, and family wants to tour SNF. Hopeful to make a decision for SNF choice tomorrow. CSW to follow back up with family. CSW to fax insurance auth request after PT note is completed.    Expected Discharge Plan: Skilled Nursing Facility Barriers to Discharge: Ship broker, Continued Medical Work up  Expected Discharge Plan and Services Expected Discharge Plan: [place] Choice: [place] arrangements for the past 2 months: Single Family Home                                       Social Determinants of Health (SDOH) Interventions    Readmission Risk Interventions No flowsheet data found.

## 2020-02-14 ENCOUNTER — Inpatient Hospital Stay (HOSPITAL_COMMUNITY): Payer: Medicare Other

## 2020-02-14 DIAGNOSIS — M25562 Pain in left knee: Secondary | ICD-10-CM

## 2020-02-14 LAB — COMPREHENSIVE METABOLIC PANEL
ALT: 66 U/L — ABNORMAL HIGH (ref 0–44)
AST: 26 U/L (ref 15–41)
Albumin: 2.1 g/dL — ABNORMAL LOW (ref 3.5–5.0)
Alkaline Phosphatase: 86 U/L (ref 38–126)
Anion gap: 11 (ref 5–15)
BUN: 40 mg/dL — ABNORMAL HIGH (ref 8–23)
CO2: 20 mmol/L — ABNORMAL LOW (ref 22–32)
Calcium: 8.7 mg/dL — ABNORMAL LOW (ref 8.9–10.3)
Chloride: 107 mmol/L (ref 98–111)
Creatinine, Ser: 1.66 mg/dL — ABNORMAL HIGH (ref 0.44–1.00)
GFR calc Af Amer: 35 mL/min — ABNORMAL LOW (ref >60)
GFR calc non Af Amer: 30 mL/min — ABNORMAL LOW (ref >60)
Glucose, Bld: 130 mg/dL — ABNORMAL HIGH (ref 70–99)
Potassium: 4.2 mmol/L (ref 3.5–5.1)
Sodium: 138 mmol/L (ref 135–145)
Total Bilirubin: 0.4 mg/dL (ref 0.3–1.2)
Total Protein: 5.9 g/dL — ABNORMAL LOW (ref 6.5–8.1)

## 2020-02-14 LAB — CBC
HCT: 26.9 % — ABNORMAL LOW (ref 36.0–46.0)
Hemoglobin: 8.1 g/dL — ABNORMAL LOW (ref 12.0–15.0)
MCH: 25.6 pg — ABNORMAL LOW (ref 26.0–34.0)
MCHC: 30.1 g/dL (ref 30.0–36.0)
MCV: 84.9 fL (ref 80.0–100.0)
Platelets: 218 10*3/uL (ref 150–400)
RBC: 3.17 MIL/uL — ABNORMAL LOW (ref 3.87–5.11)
RDW: 15.7 % — ABNORMAL HIGH (ref 11.5–15.5)
WBC: 7 10*3/uL (ref 4.0–10.5)
nRBC: 0 % (ref 0.0–0.2)

## 2020-02-14 LAB — SARS CORONAVIRUS 2 (TAT 6-24 HRS): SARS Coronavirus 2: NEGATIVE

## 2020-02-14 LAB — GLUCOSE, CAPILLARY
Glucose-Capillary: 99 mg/dL (ref 70–99)
Glucose-Capillary: 128 mg/dL — ABNORMAL HIGH (ref 70–99)
Glucose-Capillary: 120 mg/dL — ABNORMAL HIGH (ref 70–99)
Glucose-Capillary: 47 mg/dL — ABNORMAL LOW (ref 70–99)
Glucose-Capillary: 153 mg/dL — ABNORMAL HIGH (ref 70–99)

## 2020-02-14 LAB — URIC ACID: Uric Acid, Serum: 6 mg/dL (ref 2.5–7.1)

## 2020-02-14 MED ORDER — AMLODIPINE BESYLATE 10 MG PO TABS: 10.0000 mg | ORAL_TABLET | Freq: Every day | ORAL | Status: DC

## 2020-02-14 MED ORDER — INSULIN DETEMIR 100 UNIT/ML ~~LOC~~ SOLN: 12.0000 [IU] | Freq: Two times a day (BID) | SUBCUTANEOUS | 11 refills | Status: DC

## 2020-02-14 MED ORDER — INSULIN ASPART 100 UNIT/ML ~~LOC~~ SOLN: 0.0000 [IU] | Freq: Three times a day (TID) | SUBCUTANEOUS | 11 refills | Status: DC

## 2020-02-14 MED ORDER — ACETAMINOPHEN 500 MG PO TABS: 500.0000 mg | ORAL_TABLET | Freq: Four times a day (QID) | ORAL | 0 refills | Status: DC | PRN

## 2020-02-14 MED ORDER — POLYETHYLENE GLYCOL 3350 17 G PO PACK: 17.0000 g | PACK | Freq: Every day | ORAL | 0 refills | Status: DC | PRN

## 2020-02-14 MED ORDER — IBUPROFEN 200 MG PO TABS
400.0000 mg | ORAL_TABLET | Freq: Three times a day (TID) | ORAL | Status: AC
  Administered 2020-02-14 (×3): 400 mg via ORAL
  Filled 2020-02-14 (×3): qty 2

## 2020-02-14 MED ORDER — ENSURE ENLIVE PO LIQD: 237.0000 mL | Freq: Three times a day (TID) | ORAL | 12 refills | Status: DC

## 2020-02-14 MED ORDER — METOPROLOL TARTRATE 100 MG PO TABS: 100.0000 mg | ORAL_TABLET | Freq: Two times a day (BID) | ORAL | Status: DC

## 2020-02-14 MED ORDER — PANTOPRAZOLE SODIUM 40 MG PO TBEC: 40.0000 mg | DELAYED_RELEASE_TABLET | Freq: Every day | ORAL | Status: DC

## 2020-02-14 MED ORDER — PROSOURCE PLUS PO LIQD: 30.0000 mL | Freq: Two times a day (BID) | ORAL | Status: DC

## 2020-02-14 MED ORDER — SENNA 8.6 MG PO TABS: 1.0000 | ORAL_TABLET | Freq: Two times a day (BID) | ORAL | 0 refills | Status: DC

## 2020-02-14 MED ORDER — FAMOTIDINE 20 MG PO TABS
20.0000 mg | ORAL_TABLET | Freq: Every day | ORAL | Status: AC
  Administered 2020-02-14 – 2020-02-15 (×2): 20 mg via ORAL
  Filled 2020-02-14 (×3): qty 1

## 2020-02-14 MED ORDER — LIDOCAINE 5 % EX PTCH: 1.0000 | MEDICATED_PATCH | CUTANEOUS | 0 refills | Status: DC

## 2020-02-14 MED ORDER — DULOXETINE HCL 60 MG PO CPEP: 60.0000 mg | ORAL_CAPSULE | Freq: Every day | ORAL | 3 refills | Status: DC

## 2020-02-14 MED ORDER — INSULIN ASPART 100 UNIT/ML ~~LOC~~ SOLN: 4.0000 [IU] | Freq: Three times a day (TID) | SUBCUTANEOUS | 11 refills | Status: DC

## 2020-02-14 MED ORDER — THIAMINE HCL 100 MG PO TABS: 100.0000 mg | ORAL_TABLET | Freq: Every day | ORAL | Status: DC

## 2020-02-14 MED ORDER — GLUCOSE 40 % PO GEL
ORAL | Status: AC
  Administered 2020-02-14: 37.5 g
  Filled 2020-02-14: qty 1

## 2020-02-14 NOTE — Progress Notes (Signed)
  Pt CBG at 0630 was 99 post orange juice given pt was not will to drink the juice so 0630  4 unit novo log insulin was moved to 0800   Oncoming RN made aware.

## 2020-02-14 NOTE — Progress Notes (Signed)
Occupational Therapy Treatment Patient Details Name: Miranda Roman MRN: [id number] DOB: 1945/10/16 Today's Date: 02/14/2020    History of present illness Patient is a 74 year old female. 3 weeks prior she injured her foot tubing on vacation. The next day she began to have chest pain and quickly developed acute encephalopathy. She was flown from [place] (on vacation) to [place] where she lives. 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 Patient's session encompassed hoyer lift to chair as son advocating for mother to get OOB. Pt with need for increased encouragement to get OOB, with eventual nodding to therapist. Pt unable to follow commands to engage in AROM, and therapist completing minimal PROM to BLEs prior to transfer. Pt remains total A of to transfer to chair, with no following of commands, only exclaiming in pain when bending L knee. At end of session with significant extra time, pt able to state "nice to meet you too" to therapist but no other vocalizations noted. Discharge remains appropriate at this time, will continue to follow acutely.    Follow Up Recommendations  LTACH    Equipment Recommendations  None recommended by OT    Recommendations for Other Services      Precautions / Restrictions Precautions Precautions: Fall Restrictions Weight Bearing Restrictions: No       Mobility Bed Mobility Overal bed mobility: Needs Assistance Bed Mobility: Rolling Rolling: Total assist;+2 for physical assistance            Transfers Overall transfer level: Needs assistance Equipment used: Ambulation equipment used       Squat pivot transfers: Total assist;+2 safety/equipment     General transfer comment: Harrel Lemon lifted to chair due to safety    Balance Overall balance assessment: Needs assistance Sitting-balance support:  Bilateral upper extremity supported;Feet supported Sitting balance-Leahy Scale: Zero       Standing balance-Leahy Scale: Zero Standing balance comment: Deferred due to safety                           ADL either performed or assessed with clinical judgement   ADL Overall ADL's : Needs assistance/impaired                                     Functional mobility during ADLs: Total assistance;+2 for physical assistance General ADL Comments: Session focus on hoyer to the chair as son wanted pt up     Vision       Perception     Praxis      Cognition Arousal/Alertness: Awake/alert;Lethargic Behavior During Therapy: Flat affect Overall Cognitive Status: Impaired/Different from baseline Area of Impairment: Following commands;Attention;Problem solving;Awareness                   Current Attention Level: Focused;Sustained (short periods of sustained attention)   Following Commands: Follows one step commands inconsistently;Follows one step commands with increased time   Awareness: Intellectual Problem Solving: Slow processing;Difficulty sequencing;Decreased initiation;Requires verbal cues;Requires tactile cues General Comments: Not able to follow commands or verbalize with the exception of "nice to meet you to" at end of session. Would nod inconsisently, only reaction was screaming in pain during hoyer transfer back to bed        Exercises     Shoulder Instructions  General Comments      Pertinent Vitals/ Pain       Pain Assessment: Faces Faces Pain Scale: Hurts worst Pain Location: hip/knee on the left Pain Descriptors / Indicators: Discomfort;Grimacing;Guarding;Moaning;Crying Pain Intervention(s): Monitored during session;Repositioned  Home Living                                          Prior Functioning/Environment              Frequency  Min 2X/week        Progress Toward Goals  OT  Goals(current goals can now be found in the care plan section)  Progress towards OT goals: Progressing toward goals  Acute Rehab OT Goals Patient Stated Goal: pt unable to state.  Family is hoping that she will improve and be able to engage and be as independent as possible  OT Goal Formulation: With patient/family Time For Goal Achievement: 02/20/20 Potential to Achieve Goals: [place] Discharge plan remains appropriate;Frequency remains appropriate    Co-evaluation                 AM-PAC OT "6 Clicks" Daily Activity     Outcome Measure   Help from another person eating meals?: Total Help from another person taking care of personal grooming?: Total Help from another person toileting, which includes using toliet, bedpan, or urinal?: Total Help from another person bathing (including washing, rinsing, drying)?: Total Help from another person to put on and taking off regular upper body clothing?: Total Help from another person to put on and taking off regular lower body clothing?: Total 6 Click Score: 6    End of Session Equipment Utilized During Treatment: Other (comment) Harrel Lemon lift)  OT Visit Diagnosis: Cognitive communication deficit (R41.841)   Activity Tolerance Patient limited by lethargy;Patient limited by pain;Patient limited by fatigue   Patient Left in chair;with call bell/phone within reach;with nursing/sitter in room;with family/visitor present   Nurse Communication Mobility status;Need for lift equipment;Patient requests pain meds        Time: 7591-6384 OT Time Calculation (min): 25 min  Charges: OT General Charges $OT Visit: 1 Visit OT Treatments $Self Care/Home Management : 23-37 mins  [place]. [place], [facility] [phone] [place] 02/14/2020, 4:02 PM

## 2020-02-14 NOTE — Plan of Care (Signed)
  Problem: Clinical Measurements: Goal: Ability to maintain clinical measurements within normal limits will improve Outcome: Progressing   Problem: Clinical Measurements: Goal: Ability to maintain clinical measurements within normal limits will improve Outcome: Progressing   Problem: Health Behavior/Discharge Planning: Goal: Ability to manage health-related needs will improve Outcome: Progressing

## 2020-02-14 NOTE — Progress Notes (Signed)
[facility]  PROGRESS NOTE  BEVAN VU LAG:[id number] DOB: Nov 09, 1945 DOA: 01/13/2020 PCP: Glendale Chard, MD   Brief HPI:   74 year old female with a history of diabetes mellitus type 2, CKD stage IV, [place] trait, beta thalassemia, prior bariatric surgery was transferred to [facility] from [place] where she originally presented on 12/21/2019 with complaints of chest pain.  VQ scan and Dopplers were negative at that time.  Cardiac work-up revealed only moderate pulmonary hypertension.  Her hospital course was complicated by sudden onset of encephalopathy, leukocytosis and fever.  ID and neurology were consulted and patient underwent MRI of the brain and lumbar puncture.  Both were unrevealing.  EEG was also unrevealing.  Her case was complicated by anemia, thrombocytopenia, acute kidney injury with consideration being given to be TTP.  She was treated with 5 rounds of plasmapheresis and steroids without improvement.  Adam TS 13 was also normal.  Bone marrow biopsy was performed.  At the request of family she was transferred to [facility].  Work-up after she arrived: Included negative long-term EEG, negative MRI brain, negative repeat LP.  She had repeat course of IVIG x5 days which is now complete. For the past 1 week patient has continued to improve minimally daily and has been able to pass speech evaluation with advancing diet.  She is currently unable to keep up with caloric and fluid needs and is having tube feeding and free water flushes overnight via NG tube.  NG tube was removed on 02/07/2020 per family request and they wish to advance patient's diet and hope that removal of NG will assist in her swallowing and speech.  General surgery is on standby for likely laparoscopic feeding tube placement on 02/09/2020 if patient unable to take enough by mouth.    Subjective   Patient has pain in the left knee and has been moaning on moving her left leg.   Assessment/Plan:     1. Acute  metabolic encephalopathy-significantly improved, likely Wernicke's versus autoimmune encephalopathy.  Patient mental status is waxing and waning.  Feeding tube was removed on 02/07/2020 and the hope that patient will start taking by mouth.  If patient unable to take 80% better of daily calories and recommended food intake she will need laparoscopic feeding tube placement per general surgery.  Lumbar puncture on 12/23/2019 was negative for infection.  Repeat lumbar puncture on 01/16/2020 was again unremarkable.  B12, folate, iron studies, MMA, TSH, ammonia, autoimmune studies, heavy metal  screen, Lyme disease, arbovirus, West Nile virus studies unremarkable.  EEG negative for seizure activity.  MRI brain on 01/15/2020 is negative for acute findings.  Patient was previously on [place].  Neurology on 01/25/2020, recommended discontinuing or tapering Keppra and monitoring closely for seizures.  Patient completed 5 days of IVIG which was started on 01/16/2020.  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  Recommended to continue supportive care and follow-up on CSF autoimmune encephalitis panel paraneoplastic panel.  The results of these tests are negative. 2. Transaminitis-significantly improved, patient had significant elevated LFTs with AST and ALT.  Unclear etiology.  LFTs are slowly improving.  Hepatitis panel is negative, abdominal ultrasound shows cholelithiasis without significant abnormality.   Low-dose Tylenol has been started for pain as patient family wants to avoid opioids.  Follow LFTs in a.m. 3. Moderate protein calorie malnutrition dysphagia-slowly improving.  Due to history of gastric bypass, neither GI or IR were  able to assist with feeding tube placement.  Surgery was consulted for laparoscopic  feeding tube placement.    Cortrack tube has been removed.  Patient on p.o. diet.  Continue calorie count.  Registered dietitian following.  Family has decided to continue with p.o. diet only.  No  feeding tube at this time. 4. Diabetes mellitus type 2-patient normally uses insulin pump at baseline.  Hemoglobin A1c is 9.1.  Continue Levemir, sliding scale insulin NovoLog.  CBG monitoring. 5. Gout-left knee is warm to touch and tender to palpation, no effusion appreciated.  Likely gout.  X-ray of the knee obtained shows no abnormality.  Start ibuprofen 400 mg p.o. 3 times daily for 3 doses. 6. CKD stage IV-patient baseline creatinine 2.4 per chart review.  Today creatinine 1.52. 7. L1-L5 vertebral body/sacral sclerosis-noted per hematology at [facility].  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 8. Hypertension -continue amlodipine, metoprolol. 9. Asymptomatic bacteriuria-urine culture grew multiple species. 10. Itching-Will avoid hydroxyzine or Benadryl due to their effects on mental status.  Continue local care for itching.  Discussed with RN and patient's daughter at bedside. 11. Goals of care-palliative care consulted    Scheduled medications:   . (feeding supplement) PROSource Plus  30 mL Oral BID BM  . amLODipine  10 mg Oral Daily  . chlorhexidine  15 mL Mouth Rinse BID  . DULoxetine  60 mg Oral Daily  . famotidine  20 mg Oral Daily  . feeding supplement (ENSURE ENLIVE)  237 mL Oral TID BM  . heparin injection (subcutaneous)  5,000 Units Subcutaneous Q8H  . ibuprofen  400 mg Oral TID  . insulin aspart  0-15 Units Subcutaneous TID WC  . insulin aspart  4 Units Subcutaneous TID AC & HS  . insulin detemir  12 Units Subcutaneous BID  . lidocaine  1 patch Transdermal Q24H  . mouth rinse  15 mL Mouth Rinse q12n4p  . metoprolol tartrate  100 mg Oral BID  . pantoprazole  40 mg Oral Daily  . senna  1 tablet Oral BID  . thiamine  100 mg Oral Daily         CBG: Recent Labs  Lab 02/13/20 1241 02/13/20 1722 02/13/20 2111 02/14/20 0621 02/14/20 1146  GLUCAP 156* 176* 125* 99 128*    SpO2: 100 %    CBC: Recent Labs  Lab 02/08/20 0349 02/09/20 0251 02/11/20 0311  WBC 8.1  7.2 6.9  HGB 8.7* 8.0* 8.5*  HCT 27.6* 25.7* 27.4*  MCV 84.4 84.8 85.4  PLT 236 266 568    Basic Metabolic Panel: Recent Labs  Lab 02/08/20 0349 02/09/20 0251 02/11/20 0311  NA 136 137 135  K 4.9 4.5 4.2  CL 105 106 104  CO2 19* 25 24  GLUCOSE 132* 84 131*  BUN 51* 46* 38*  CREATININE 1.54* 1.54* 1.52*  CALCIUM 8.3* 8.4* 8.5*     Liver Function Tests: Recent Labs  Lab 02/08/20 0349 02/09/20 0251 02/11/20 0311  AST 63* 41 41  ALT 244* 181* 126*  ALKPHOS 93 100 98  BILITOT 0.4 0.4 0.6  PROT 5.9* 5.8* 6.0*  ALBUMIN 1.9* 1.9* 2.1*     Antibiotics: Anti-infectives (From admission, onward)   None       DVT prophylaxis: SCDs  Code Status: Full code  Family Communication: Spoke to patient sons at bedside    Status is: Inpatient  Dispo: The patient is from: Home              Anticipated d/c is to: Skilled nursing facility  Anticipated d/c date is: 02/15/20              Patient is medically stable for discharge  Barrier to discharge-awaiting bed placement at skilled facility     Consultants:  General surgery  Neurology  Procedures:  EEG  Lumbar puncture  Cortrack feeding tube   Objective   Vitals:   02/13/20 2355 02/14/20 0406 02/14/20 0918 02/14/20 1250  BP: (!) 114/58 (!) 118/94 (!) 119/56 (!) 110/53  Pulse: 84 83 77 76  Resp: _0 Temp: 98.4 F (36.9 C) 99.8 F (37.7 C) 98 F (36.7 C) 98.2 F (36.8 C)  TempSrc: Oral Oral Axillary Oral  SpO2: 100% 100% 100% 100%  Weight:        Intake/Output Summary (Last 24 hours) at 02/14/2020 1530 Last data filed at 02/14/2020 0600 Gross per 24 hour  Intake 840 ml  Output --  Net 840 ml    08/08 1901 - 08/10 0700 In: 1200 [P.O.:600; I.V.:600] Out: 1150 [Urine:1150]  Filed Weights   02/11/20 0217 02/12/20 0452 02/13/20 0425  Weight: 80.5 kg 80.4 kg 80.2 kg    Physical Examination:  General-appears in no acute distress Heart-S1-S2, regular, no murmur  auscultated Lungs-clear to auscultation bilaterally, no wheezing or crackles auscultated Abdomen-soft, nontender, no organomegaly Extremities-left knee warm to touch, tender to palpation.  No joint effusion appreciated. Neuro-alert, oriented x3, no focal deficit noted   Data Reviewed:   Recent Results (from the past 240 hour(s))  MRSA PCR Screening     Status: None   Collection Time: 02/10/20  5:22 AM   Specimen: Nasal Mucosa; Nasopharyngeal  Result Value Ref Range Status   MRSA by PCR NEGATIVE NEGATIVE Final    Comment:        The GeneXpert MRSA Assay (FDA approved for NASAL specimens only), is one component of a comprehensive MRSA colonization surveillance program. It is not intended to diagnose MRSA infection nor to guide or monitor treatment for MRSA infections. Performed at [facility], [place] [address], [place], [place] [address]       [facility] If 7PM-7AM, please contact night-coverage at [email or web address], Office  [phone]   02/14/2020, 3:30 PM  LOS: 32 days

## 2020-02-14 NOTE — Discharge Summary (Addendum)
Physician Discharge Summary  Miranda Roman WUJ:[id number] DOB: May 10, 1946 DOA: 01/13/2020  PCP: Glendale Chard, MD  Admit date: 01/13/2020 Discharge date: 02/15/2020  Time spent: 50 minutes  Recommendations for Outpatient Follow-up:  1. Patient to be discharged to skilled nursing facility for rehab 2. 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)   Benign essential HTN   PAD (peripheral artery disease) (HCC)   Class 1 obesity due to excess calories with serious comorbidity and body mass index (BMI) of 31.0 to 31.9 in adult   Pressure injury of skin   Altered mental status   Palliative care by specialist   History of back pain   Goals of care, counseling/discussion   Inadequate oral nutritional intake   Discharge Condition: Stable  Diet recommendation: Regular diet  Filed Weights   02/11/20 0217 02/12/20 0452 02/13/20 0425  Weight: 80.5 kg 80.4 kg 80.2 kg    History of present illness:  74 year old female with a history of diabetes mellitus type 2, CKD stage IV, Kinsley trait, beta thalassemia, prior bariatric surgery was transferred to [facility] from [place] where she originally presented on 12/21/2019 with complaints of chest pain.  VQ scan and Dopplers were negative at that time.  Cardiac work-up revealed only moderate pulmonary hypertension.  Her hospital course was complicated by sudden onset of encephalopathy, leukocytosis and fever.  ID and neurology were consulted and patient underwent MRI of the brain and lumbar puncture.  Both were unrevealing.  EEG was also unrevealing.  Her case was complicated by anemia, thrombocytopenia, acute kidney injury with consideration being given to be TTP.  She was treated with 5 rounds of plasmapheresis and steroids without improvement.  Adam TS 13 was also normal.  Bone marrow  biopsy was performed.  At the request of family she was transferred to [facility].  Work-up after she arrived: Included negative long-term EEG, negative MRI brain, negative repeat LP.  She had repeat course of IVIG x5 days which is now complete. For the past 1 week patient has continued to improve minimally daily and has been able to pass speech evaluation with advancing diet.  She is currently unable to keep up with caloric and fluid needs and is having tube feeding and free water flushes overnight via NG tube.  NG tube was removed on 02/07/2020 per family request and they wish to advance patient's diet and hope that removal of NG will assist in her swallowing and speech.  General surgery is on standby for likely laparoscopic feeding tube placement on 02/09/2020 if patient unable to take enough by mouth.   Hospital Course:  1. Acute metabolic encephalopathy-significantly improved, likely Wernicke's versus autoimmune encephalopathy.  Patient mental status was waxing and waning, now steadily improving.    Feeding tube was removed on 02/07/2020 General surgery was consulted for laparoscopic feeding tube placement, however patient started eating and family refused laparoscopic feeding tube placement..  Lumbar puncture on 12/23/2019 was negative for infection.  Repeat lumbar puncture on 01/16/2020 was again unremarkable.  B12, folate, iron studies, MMA, TSH, ammonia, autoimmune studies, heavy metal  screen, Lyme disease, arbovirus, West Nile virus studies unremarkable.  EEG negative for seizure activity.  MRI brain on 01/15/2020 is negative for acute findings.  Patient was previously on [place].  Neurology on 01/25/2020, recommended discontinuing or tapering Keppra and monitoring  closely for seizures.  Keppra has been discontinued.  No seizures observed. Patient completed 5 days of IVIG which was started on 01/16/2020.  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  Recommended to continue supportive  care and  CSF autoimmune encephalitis panel,  paraneoplastic panel which was sent out to [facility] are negative.  2. Transaminitis-significantly improved, patient had significant elevated LFTs with AST and ALT on 02/02/2020.  Unclear etiology.  LFTs are slowly improving.  Hepatitis panel is negative, abdominal ultrasound shows cholelithiasis without significant abnormality.   Low-dose Tylenol has been started for pain as patient family wants to avoid opioids.    3. Moderate protein calorie malnutrition dysphagia-slowly improving.  Due to history of gastric bypass, neither GI or IR were  able to assist with feeding tube placement.  Surgery was consulted for laparoscopic feeding tube placement.    Cortrack tube was removed.  Patient started on  p.o. diet.  Continue calorie count.  Registered dietitian following.  Family has decided to continue with p.o. diet only.  No feeding tube at this time.  4. Diabetes mellitus type 2-patient normally uses insulin pump at baseline.  Hemoglobin A1c is 9.1.  Continue Levemir, sliding scale insulin NovoLog, NovoLog 4 units 3 times daily meal coverage.  CBG monitoring. 5.  6. CKD stage IV-patient baseline creatinine 2.4 per chart review.    Creatinine improved to 1.52.  7. L1-L5 vertebral body/sacral sclerosis-noted per hematology at [facility].  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  8. Hypertension -continue amlodipine, metoprolol.  9. Asymptomatic bacteriuria-urine culture grew multiple species.  10. Left knee pain-patient has warmth and tenderness of left knee.  X-ray of the knee showed no acute normality.  Likely gout.  Improved with ibuprofen.  CBC done today shows normal WBC  11. Goals of care-palliative care consulted.  Patient is full code.  Procedures:  EEG  Lumbar puncture  Cortrack feeding tube  Consultations:  General surgery  Neurology  Discharge Exam: Vitals:   02/14/20 1250 02/14/20 1615  BP: (!) 110/53 118/60  Pulse: 76 72  Resp:  16 19  Temp: 98.2 F (36.8 C) 98.3 F (36.8 C)  SpO2: 100% 100%       Discharge Instructions   Discharge Instructions    Diet - low sodium heart healthy   Complete by: As directed    Increase activity slowly   Complete by: As directed    No wound care   Complete by: As directed      Allergies as of 02/15/2020      Reactions   Cefepime Other (See Comments)   Causes encephalitis - reported by [facility] 12/31/2019   Lactose Intolerance (gi)    Per pt's daughter causes gas      Medication List    STOP taking these medications   Dexilant 60 MG capsule Generic drug: dexlansoprazole   insulin lispro 100 UNIT/ML injection Commonly known as: HUMALOG   Levemir FlexTouch 100 UNIT/ML FlexPen Generic drug: insulin detemir Replaced by: insulin detemir 100 UNIT/ML injection   levETIRAcetam 500 MG/5ML injection Commonly known as: KEPPRA     TAKE these medications   acetaminophen 500 MG tablet Commonly known as: TYLENOL Take 1 tablet (500 mg total) by mouth every 6 (six) hours as needed for moderate pain or fever.   amLODipine 10 MG tablet Commonly known as: NORVASC Take 1 tablet (10 mg total) by mouth daily.   diclofenac Sodium 1 % Gel Commonly known as: VOLTAREN Apply 2  g topically 4 (four) times daily. Apply to left knee.   DULoxetine 60 MG capsule Commonly known as: CYMBALTA Take 1 capsule (60 mg total) by mouth daily.   feeding supplement (ENSURE ENLIVE) Liqd Take 237 mLs by mouth 3 (three) times daily between meals.   (feeding supplement) PROSource Plus liquid Take 30 mLs by mouth 2 (two) times daily between meals.   glucose blood test strip Commonly known as: OneTouch Verio Use as instructed to check blood sugars 1 time per day e11.22   insulin aspart 100 UNIT/ML injection Commonly known as: novoLOG Inject 0-15 Units into the skin 3 (three) times daily with meals. Sliding scale insulin Less than 70 initiate hypoglycemia protocol 70-120  0  units 120-150 2 unit 151-200 3 units 201-250 5 units 251-300 8 units 301-350 11 units 351-400 15 units  Greater than 400 call MD   insulin aspart 100 UNIT/ML injection Commonly known as: novoLOG Inject 4 Units into the skin 3 (three) times daily with meals.   insulin detemir 100 UNIT/ML injection Commonly known as: LEVEMIR Inject 0.12 mLs (12 Units total) into the skin 2 (two) times daily. Replaces: Levemir FlexTouch 100 UNIT/ML FlexPen   lidocaine 5 % Commonly known as: LIDODERM Place 1 patch onto the skin daily. Remove & Discard patch within 12 hours or as directed by MD   loratadine 10 MG tablet Commonly known as: Claritin Take 1 tablet (10 mg total) by mouth daily.   metoprolol tartrate 100 MG tablet Commonly known as: LOPRESSOR Take 1 tablet (100 mg total) by mouth 2 (two) times daily. What changed:   medication strength  how much to take   OneTouch Delica Lancets 28B Misc 1 each by Does not apply route daily. DX: E11.65   pantoprazole 40 MG tablet Commonly known as: PROTONIX Take 1 tablet (40 mg total) by mouth daily.   polyethylene glycol 17 g packet Commonly known as: MIRALAX / GLYCOLAX Take 17 g by mouth daily as needed for moderate constipation or severe constipation.   senna 8.6 MG Tabs tablet Commonly known as: SENOKOT Take 1 tablet (8.6 mg total) by mouth 2 (two) times daily.   thiamine 100 MG tablet Take 1 tablet (100 mg total) by mouth daily.      Allergies  Allergen Reactions  . Cefepime Other (See Comments)    Causes encephalitis - reported by [facility] 12/31/2019  . Lactose Intolerance (Gi)     Per pt's daughter causes gas      The results of significant diagnostics from this hospitalization (including imaging, microbiology, ancillary and laboratory) are listed below for reference.    Significant Diagnostic Studies: MR BRAIN W WO CONTRAST  Result Date: 01/15/2020 CLINICAL DATA:  Encephalopathy EXAM: MRI HEAD WITHOUT AND WITH  CONTRAST TECHNIQUE: Multiplanar, multiecho pulse sequences of the brain and surrounding structures were obtained without and with intravenous contrast. CONTRAST:  7.54m GADAVIST GADOBUTROL 1 MMOL/ML IV SOLN COMPARISON:  None. FINDINGS: BRAIN: No acute infarct, acute hemorrhage or extra-axial collection. Multifocal white matter hyperintensity, most commonly due to chronic ischemic microangiopathy. Normal volume of CSF spaces. No chronic microhemorrhage. Normal midline structures. There is no abnormal contrast enhancement. VASCULAR: Major flow voids are preserved. SKULL AND UPPER CERVICAL SPINE: Normal calvarium and skull base. Visualized upper cervical spine and soft tissues are normal. SINUSES/ORBITS: Partial left frontal sinus opacification no mastoid effusion normal orbits. IMPRESSION: 1. No acute intracranial abnormality. 2. Mild chronic small vessel disease. Electronically Signed   By: KCletus GashD.  On: 01/15/2020 22:41   US Abdomen Complete  Result Date: 02/09/2020 CLINICAL DATA:  Elevated LFTs EXAM: ABDOMEN ULTRASOUND COMPLETE COMPARISON:  03/30/2019 FINDINGS: Gallbladder: Well distended with evidence of gallbladder sludge and small stones. No wall thickening or pericholecystic fluid is noted. Common bile duct: Diameter: 3.7 mm. Liver: Mild heterogeneity is noted without focal mass. No nodularity is seen. Portal vein is patent on color Doppler imaging with normal direction of blood flow towards the liver. IVC: No abnormality visualized. Pancreas: Small cyst is noted in the pancreatic body measuring 5 mm. The remainder of the pancreas is within normal limits. Spleen: Size and appearance within normal limits. Right Kidney: Length: 9.9 cm. No mass lesion or hydronephrosis is noted. Scattered small cysts are noted the largest of these measures 1.9 cm. Left Kidney: Length: 9.8 cm. Single 2.3 cm cyst is noted in the lower pole. This has increased in size from the prior MRI. Abdominal aorta: No aneurysm  visualized. Other findings: None. IMPRESSION: Scattered bilateral renal cysts. The lower pole cyst on the left is increased in size from the prior exam at which time it measured approximately 8 mm. Pancreatic cyst stable in appearance from the prior study. Cholelithiasis and gallbladder sludge without complicating factors. Electronically Signed   By: Inez Catalina M.D.   On: 02/09/2020 10:08   DG Knee Complete 4 Views Left  Result Date: 02/14/2020 CLINICAL DATA:  Left knee pain. EXAM: LEFT KNEE - COMPLETE 4+ VIEW COMPARISON:  None. FINDINGS: No evidence of fracture, dislocation, or joint effusion. No evidence of arthropathy or other focal bone abnormality. Soft tissues are unremarkable. IMPRESSION: Negative. Electronically Signed   By: Misty Stanley M.D.   On: 02/14/2020 11:53   DG Abd Portable 1V  Result Date: 01/18/2020 CLINICAL DATA:  Feeding tube placement EXAM: PORTABLE ABDOMEN - 1 VIEW COMPARISON:  January 16, 2020 FINDINGS: Feeding tube tip in region of distal stomach. No bowel dilatation or air-fluid level to suggest bowel obstruction. No free air. IMPRESSION: Feeding tube tip in distal stomach region. No bowel obstruction or free air evident. Electronically Signed   By: Lowella Grip III M.D.   On: 01/18/2020 15:15   DG Abd Portable 1V  Result Date: 01/16/2020 CLINICAL DATA:  Enteric catheter placement EXAM: PORTABLE ABDOMEN - 1 VIEW COMPARISON:  None. FINDINGS: Two frontal views of the abdomen and pelvis are obtained, excluding the hemidiaphragms and right flank by collimation. The tip of an enteric feeding catheter overlies the gastric antrum. Bowel gas pattern is unremarkable. IMPRESSION: 1. Enteric catheter tip projects over gastric antrum. Electronically Signed   By: Randa Ngo M.D.   On: 01/16/2020 21:10   DG FL GUIDED LUMBAR PUNCTURE  Result Date: 01/16/2020 CLINICAL DATA:  Altered mental status EXAM: DIAGNOSTIC LUMBAR PUNCTURE UNDER FLUOROSCOPIC GUIDANCE FLUOROSCOPY TIME:   Fluoroscopy Time:  0 minutes, 24 seconds Radiation Exposure Index (if provided by the fluoroscopic device): 4.8 mGy Number of Acquired Spot Images: 0 PROCEDURE: I discussed the risks (including hemorrhage, infection, and nerve damage, among others), benefits, and alternatives to fluoroscopically guided lumbar puncture with patient's daughter in-person, as the patient is unable to consent and has altered mental status. We specifically discussed the high likelihood of technical success of the procedure. The patient's daughter understood and elected for the patient to undergo the procedure. Standard time-out was employed. Following sterile skin prep and local anesthetic administration consisting of 1 percent lidocaine, a 20 gauge spinal needle was advanced without difficulty into the thecal sac at the  L5-S1 level under fluoroscopic guidance. Clear CSF was returned. Because of the altered mental status and mild patient agitation, I did not turn her onto her side to obtain a pressure measurement. A total of 12 cc of clear CSF was collected in 4 vials. The needle was subsequently removed and the skin cleansed and bandaged. No immediate complications were observed. IMPRESSION: 1. Technically successful fluoroscopically guided lumbar puncture at the L5-S1 level yielding 12 cc of clear CSF. Electronically Signed   By: Van Clines M.D.   On: 01/16/2020 12:56    Microbiology: Recent Results (from the past 240 hour(s))  MRSA PCR Screening     Status: None   Collection Time: 02/10/20  5:22 AM   Specimen: Nasal Mucosa; Nasopharyngeal  Result Value Ref Range Status   MRSA by PCR NEGATIVE NEGATIVE Final    Comment:        The GeneXpert MRSA Assay (FDA approved for NASAL specimens only), is one component of a comprehensive MRSA colonization surveillance program. It is not intended to diagnose MRSA infection nor to guide or monitor treatment for MRSA infections. Performed at [facility], [place]  [address], [place], [place] [address]      Labs: Basic Metabolic Panel: Recent Labs  Lab 02/08/20 0349 02/09/20 0251 02/11/20 0311 02/14/20 1527  NA 136 137 135 138  K 4.9 4.5 4.2 4.2  CL 105 106 104 107  CO2 19* 25 24 20*  GLUCOSE 132* 84 131* 130*  BUN 51* 46* 38* 40*  CREATININE 1.54* 1.54* 1.52* 1.66*  CALCIUM 8.3* 8.4* 8.5* 8.7*   Liver Function Tests: Recent Labs  Lab 02/08/20 0349 02/09/20 0251 02/11/20 0311 02/14/20 1527  AST 63* 41 41 26  ALT 244* 181* 126* 66*  ALKPHOS 93 100 98 86  BILITOT 0.4 0.4 0.6 0.4  PROT 5.9* 5.8* 6.0* 5.9*  ALBUMIN 1.9* 1.9* 2.1* 2.1*   No results for input(s): LIPASE, AMYLASE in the last 168 hours. Recent Labs  Lab 02/10/20 1710  AMMONIA 35   CBC: Recent Labs  Lab 02/08/20 0349 02/09/20 0251 02/11/20 0311 02/14/20 1527  WBC 8.1 7.2 6.9 7.0  HGB 8.7* 8.0* 8.5* 8.1*  HCT 27.6* 25.7* 27.4* 26.9*  MCV 84.4 84.8 85.4 84.9  PLT 236 266 264 218    CBG: Recent Labs  Lab 02/13/20 1722 02/13/20 2111 02/14/20 0621 02/14/20 1146 02/14/20 1623  GLUCAP 176* 125* 99 128* 120*       Signed:  Oswald Hillock MD.  [facility] 02/14/2020, 5:50 PM

## 2020-02-14 NOTE — Progress Notes (Signed)
Son noticed some trembling of hands mainly on her right hand,  Happen once in a while and is not persisiting.  RN notice Rt hand tremor for around 10 sec and than stop.  Daughter said she noticed it yesterday also. Will continue to monitor.

## 2020-02-15 DIAGNOSIS — Z20822 Contact with and (suspected) exposure to covid-19: Secondary | ICD-10-CM | POA: Diagnosis not present

## 2020-02-15 DIAGNOSIS — Z7401 Bed confinement status: Secondary | ICD-10-CM | POA: Diagnosis not present

## 2020-02-15 DIAGNOSIS — M4808 Spinal stenosis, sacral and sacrococcygeal region: Secondary | ICD-10-CM | POA: Diagnosis not present

## 2020-02-15 DIAGNOSIS — E440 Moderate protein-calorie malnutrition: Secondary | ICD-10-CM | POA: Diagnosis not present

## 2020-02-15 DIAGNOSIS — Z03818 Encounter for observation for suspected exposure to other biological agents ruled out: Secondary | ICD-10-CM | POA: Diagnosis not present

## 2020-02-15 DIAGNOSIS — E512 Wernicke's encephalopathy: Secondary | ICD-10-CM | POA: Diagnosis not present

## 2020-02-15 DIAGNOSIS — R251 Tremor, unspecified: Secondary | ICD-10-CM | POA: Diagnosis not present

## 2020-02-15 DIAGNOSIS — G9341 Metabolic encephalopathy: Secondary | ICD-10-CM | POA: Diagnosis not present

## 2020-02-15 DIAGNOSIS — I129 Hypertensive chronic kidney disease with stage 1 through stage 4 chronic kidney disease, or unspecified chronic kidney disease: Secondary | ICD-10-CM | POA: Diagnosis not present

## 2020-02-15 DIAGNOSIS — I10 Essential (primary) hypertension: Secondary | ICD-10-CM | POA: Diagnosis not present

## 2020-02-15 DIAGNOSIS — I739 Peripheral vascular disease, unspecified: Secondary | ICD-10-CM | POA: Diagnosis not present

## 2020-02-15 DIAGNOSIS — D649 Anemia, unspecified: Secondary | ICD-10-CM | POA: Diagnosis not present

## 2020-02-15 DIAGNOSIS — N184 Chronic kidney disease, stage 4 (severe): Secondary | ICD-10-CM | POA: Diagnosis not present

## 2020-02-15 DIAGNOSIS — M6281 Muscle weakness (generalized): Secondary | ICD-10-CM | POA: Diagnosis not present

## 2020-02-15 DIAGNOSIS — R5381 Other malaise: Secondary | ICD-10-CM | POA: Diagnosis not present

## 2020-02-15 DIAGNOSIS — R7401 Elevation of levels of liver transaminase levels: Secondary | ICD-10-CM | POA: Diagnosis not present

## 2020-02-15 DIAGNOSIS — Z87898 Personal history of other specified conditions: Secondary | ICD-10-CM | POA: Diagnosis not present

## 2020-02-15 DIAGNOSIS — R531 Weakness: Secondary | ICD-10-CM | POA: Diagnosis not present

## 2020-02-15 DIAGNOSIS — D696 Thrombocytopenia, unspecified: Secondary | ICD-10-CM | POA: Diagnosis not present

## 2020-02-15 DIAGNOSIS — I959 Hypotension, unspecified: Secondary | ICD-10-CM | POA: Diagnosis not present

## 2020-02-15 DIAGNOSIS — R569 Unspecified convulsions: Secondary | ICD-10-CM | POA: Diagnosis not present

## 2020-02-15 DIAGNOSIS — M2550 Pain in unspecified joint: Secondary | ICD-10-CM | POA: Diagnosis not present

## 2020-02-15 DIAGNOSIS — E1122 Type 2 diabetes mellitus with diabetic chronic kidney disease: Secondary | ICD-10-CM | POA: Diagnosis not present

## 2020-02-15 DIAGNOSIS — M79641 Pain in right hand: Secondary | ICD-10-CM | POA: Diagnosis not present

## 2020-02-15 DIAGNOSIS — E119 Type 2 diabetes mellitus without complications: Secondary | ICD-10-CM | POA: Diagnosis not present

## 2020-02-15 LAB — GLUCOSE, CAPILLARY
Glucose-Capillary: 84 mg/dL (ref 70–99)
Glucose-Capillary: 222 mg/dL — ABNORMAL HIGH (ref 70–99)
Glucose-Capillary: 119 mg/dL — ABNORMAL HIGH (ref 70–99)

## 2020-02-15 MED ORDER — DICLOFENAC SODIUM 1 % EX GEL
2.0000 g | Freq: Four times a day (QID) | CUTANEOUS | Status: DC
  Administered 2020-02-15 (×2): 2 g via TOPICAL
  Filled 2020-02-15: qty 100

## 2020-02-15 MED ORDER — DICLOFENAC SODIUM 1 % EX GEL: 2.0000 g | Freq: Four times a day (QID) | CUTANEOUS | 0 refills | Status: DC

## 2020-02-15 NOTE — Progress Notes (Signed)
Patient has remained stable over the last 24 hours.  This morning she does have left knee pain.  Blood pressure 130/70, heart rate 89, respiratory rate 18, oxygen saturation 98% on room air, temperature 98.3. Her lungs are clear to auscultation bilaterally, heart S1-S2, present, rhythmic, positive murmur, 2/6, systolic right parasternal, abdomen soft, no lower extremity edema.  I spoke with her daughter at the bedside and her son over the phone, I explained that Miranda Roman is stable for discharge, the sooner she can start with physical therapy at SNF the better, in order to prevent further deconditioning.

## 2020-02-15 NOTE — Progress Notes (Addendum)
Nutrition Follow-up  DOCUMENTATION CODES:   Not applicable  INTERVENTION:  -Recommend liberalizing pt back to regular diet -Continue Ensure Enlive po TID, each supplement provides 350 kcal and 20 grams of protein -Continue 42m Prosource Plus po BID, each supplement provides 100kcal and 15 grams of protein -Continue Magic cup TID with meals, each supplement provides 290 kcal and 9 grams of protein   NUTRITION DIAGNOSIS:   Inadequate oral intake related to inability to eat as evidenced by NPO status.  Progressing, pt now on regular diet  GOAL:   Patient will meet greater than or equal to 90% of their needs  Progressing  MONITOR:   Labs, I & O's, TF tolerance, Weight trends, Diet advancement  REASON FOR ASSESSMENT:   Consult Enteral/tube feeding initiation and management  ASSESSMENT:   74[facility] [place] [facility] [facility] [place] being [place] there initially for chest pain on June 15th while vacationing. Patient subesequently developed AMS, fever, thrombocytopenia, TTP was ruled out, underwent 5 rounds of plasmapheresis and started on steroids with no significant improvement. Past medical history significant for HTN, CKD IV, IDDM, history of gastric bypass (2011).  7/12 s/p LP (negative); pt removed NGT 7/13 IVIG started 7/14 Cortrak replaced (gastric) 7/29 pt's diet advanced to dysphagia 1 with thin liquids 7/30 Cortrak replaced (gastric) 8/3 Cortrak removed; pt's diet briefly changed to bariatric advanced then transitioned back to dysphagia 1 with thin liquids 8/4 pt's diet upgraded to regular  Discussed pt with RN. Pt's appetite and mentation have significantly improved. Pt noted to be awaiting d/c to SNF.   PO Intake: 10-75% x last 8 recorded meals (48% average meal intake)  Admit wt: 78.3 kg Current wt: 80.2 kg Pt noted to have non-pitting edema to BUE  Labs: CBGs 47-153 Medications: 341mProsource Plus BID,  Pepcid, Ensure Enlive TID, Novolog, Levemir, Protonix, Senokot, Thiamine  Diet Order:   Diet Order            Diet - low sodium heart healthy           Diet regular Room service appropriate? Yes; Fluid consistency: Thin  Diet effective now                 EDUCATION NEEDS:   No education needs have been identified at this time  Skin:  Skin Assessment: Reviewed RN Assessment Skin Integrity Issues:: Other (Comment) Stage I: perineum Other: MASD buttocks  Last BM:  8/10  Height:   Ht Readings from Last 1 Encounters:  09/28/19 5' 4.8" (1.646 m)    Weight:   Wt Readings from Last 1 Encounters:  02/13/20 80.2 kg    BMI:  Body mass index is 29.6 kg/m.  Estimated Nutritional Needs:   Kcal:  2000-2200  Protein:  95-110  Fluid:  >/= 2 L/day    AmLarkin InaMS, RD, LDN RD pager number and weekend/on-call pager number located in [place]

## 2020-02-15 NOTE — Progress Notes (Signed)
Pt's IV removed; site is clean, dry and intact. Discharge instructions reviewed with pt. and daughter; voiced understanding. Personal belongings were packed by pt's daughter and transported with pt. Pt. transported via stretcher to [facility] via [place]. Report was called to [facility], LPN at receiving facility.

## 2020-02-15 NOTE — TOC Transition Note (Signed)
Transition of Care [facility]) - CM/SW Discharge Note   Patient Details  Name: Miranda Roman MRN: [id number] Date of Birth: [birth year]  [facility] CM/SW Contact:  Geralynn Ochs, LCSW Phone Number: 02/15/2020, 2:11 PM   Clinical Narrative:   Nurse to call report to [phone], Room 308.    Final next level of care: Skilled Nursing Facility Barriers to Discharge: Barriers Resolved   Patient Goals and CMS Choice Patient states their goals for this hospitalization and ongoing recovery are:: patient unable to participate in goal setting at this time due to disorientation CMS Medicare.gov Compare Post Acute Care list provided to:: Patient Represenative (must comment) Choice offered to / list presented to : Adult Children  Discharge Placement              Patient chooses bed at: [facility] Patient to be transferred to facility by: [place] Name of family member notified: [name] Patient and family notified of of transfer: 02/15/20  Discharge Plan and Services     Post Acute Care Choice: [place]                               Social Determinants of Health (SDOH) Interventions     Readmission Risk Interventions No flowsheet data found.

## 2020-02-15 NOTE — Progress Notes (Signed)
Physical Therapy Treatment Patient Details Name: Miranda Roman MRN: [id number] DOB: December 23, 1945 Today's Date: 02/15/2020    History of Present Illness Patient is a 74 year old female. 3 weeks prior she injured her foot tubing on vacation. The next day she began to have chest pain and quickly developed acute encephalopathy. She was flown from [place] (on vacation) to [place] where she lives. 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 More verbal at beginning of session, then became much less verbal as we continued to work with her; responded much better to questions and cues from her daughter. No pain, muscle spasms, or areas of edema noted with palpation of L knee or passive motion of patella, but she does continue to show non-verbal signs of pain with L knee flexion especially. Continues to require totalAx2 to get to EOB and ModA to maintain sitting balance due to multidirectional balance loss. Needed multiple attempts, but able to come to 3/4 of standing position with totalAx2 and followed cues to extend hips with increased time, no signs of increased L knee pain. Left in bed with all needs met, RN present and attending. Likely DC to SNF today per CSW who arrived mid-PT session.    Follow Up Recommendations  SNF     Equipment Recommendations  None recommended by PT    Recommendations for Other Services       Precautions / Restrictions Precautions Precautions: Fall;Other (comment) Precaution Comments: L knee pain Restrictions Weight Bearing Restrictions: No    Mobility  Bed Mobility Overal bed mobility: Needs Assistance Bed Mobility: Rolling;Supine to Sit;Sit to Supine Rolling: Total assist   Supine to sit: Total assist;+2 for physical assistance Sit to supine: Total assist;+2 for physical assistance   General bed  mobility comments: totalA for all bed mobility- just stared with very flat affect wtih only occasional cue following. Slightly dizzy at EOB but did not worsen with extended sitting. No dynamap available to assess.  Transfers Overall transfer level: Needs assistance Equipment used: 2 person hand held assist Transfers: Sit to/from Stand Sit to Stand: Total assist;+2 physical assistance         General transfer comment: required two attempts but able to come to 75% upright standing position and followed cues to extend hips at EOB, no significant increase in L knee pain noted  Ambulation/Gait             General Gait Details: unable   Stairs             Wheelchair Mobility    Modified Rankin (Stroke Patients Only)       Balance Overall balance assessment: Needs assistance Sitting-balance support: Bilateral upper extremity supported;Feet supported Sitting balance-Leahy Scale: Zero Sitting balance - Comments: ModA to maintain sitting balance due to multidirectional balance loss at EOB; tended to either have posterior or anterior lean Postural control: Other (comment) (multidirectional unsteadiness)   Standing balance-Leahy Scale: Zero Standing balance comment: reliant on external support                            Cognition Arousal/Alertness: Awake/alert Behavior During Therapy: Flat affect Overall Cognitive Status: Impaired/Different from baseline Area of Impairment: Following commands;Attention;Problem solving;Awareness;Orientation                 Orientation Level: Disoriented to;Time;Situation Current  Attention Level: Sustained   Following Commands: Follows one step commands inconsistently;Follows one step commands with increased time   Awareness: Intellectual Problem Solving: Slow processing;Difficulty sequencing;Decreased initiation;Requires verbal cues;Requires tactile cues General Comments: initially more verbal and interacting with  daughter, then became more non-verbal during session. Answered more of daughters cues and questions than therapists. Lots of facial grimacing with L knee motion but improved today overall      Exercises      General Comments        Pertinent Vitals/Pain Pain Assessment: Faces Faces Pain Scale: Hurts even more Pain Location: hip/knee on the left Pain Descriptors / Indicators: Discomfort;Grimacing;Guarding;Moaning;Crying Pain Intervention(s): Limited activity within patient's tolerance;Monitored during session;Repositioned    Home Living                      Prior Function            PT Goals (current goals can now be found in the care plan section) Acute Rehab PT Goals Patient Stated Goal: pt unable to state.  Family is hoping that she will improve and be able to engage and be as independent as possible  PT Goal Formulation: With family Time For Goal Achievement: 02/18/20 Progress towards PT goals: Progressing toward goals    Frequency    Min 2X/week      PT Plan Current plan remains appropriate    Co-evaluation              AM-PAC PT "6 Clicks" Mobility   Outcome Measure  Help needed turning from your back to your side while in a flat bed without using bedrails?: Total Help needed moving from lying on your back to sitting on the side of a flat bed without using bedrails?: Total Help needed moving to and from a bed to a chair (including a wheelchair)?: Total Help needed standing up from a chair using your arms (e.g., wheelchair or bedside chair)?: Total Help needed to walk in hospital room?: Total Help needed climbing 3-5 steps with a railing? : Total 6 Click Score: 6    End of Session Equipment Utilized During Treatment: Gait belt Activity Tolerance: Patient tolerated treatment well Patient left: in bed;with call bell/phone within reach;with family/visitor present;with nursing/sitter in room Nurse Communication: Mobility status PT Visit  Diagnosis: Other abnormalities of gait and mobility (R26.89);Muscle weakness (generalized) (M62.81);Other symptoms and signs involving the nervous system (R29.898);Pain Pain - Right/Left: Left Pain - part of body:  (hip and knee)     Time: 9767-3419 PT Time Calculation (min) (ACUTE ONLY): 24 min  Charges:  $Therapeutic Activity: 23-37 mins                     Windell Norfolk, DPT, PN1   Supplemental Physical Therapist [place]    Pager [phone] Acute Rehab Office [phone]

## 2020-02-16 DIAGNOSIS — E440 Moderate protein-calorie malnutrition: Secondary | ICD-10-CM | POA: Diagnosis not present

## 2020-02-16 DIAGNOSIS — R7401 Elevation of levels of liver transaminase levels: Secondary | ICD-10-CM | POA: Diagnosis not present

## 2020-02-16 DIAGNOSIS — Z87898 Personal history of other specified conditions: Secondary | ICD-10-CM | POA: Diagnosis not present

## 2020-02-16 DIAGNOSIS — G9341 Metabolic encephalopathy: Secondary | ICD-10-CM | POA: Diagnosis not present

## 2020-02-20 DIAGNOSIS — R5381 Other malaise: Secondary | ICD-10-CM | POA: Diagnosis not present

## 2020-02-20 DIAGNOSIS — I10 Essential (primary) hypertension: Secondary | ICD-10-CM | POA: Diagnosis not present

## 2020-02-20 DIAGNOSIS — G9341 Metabolic encephalopathy: Secondary | ICD-10-CM | POA: Diagnosis not present

## 2020-02-20 DIAGNOSIS — E119 Type 2 diabetes mellitus without complications: Secondary | ICD-10-CM | POA: Diagnosis not present

## 2020-02-20 DIAGNOSIS — D649 Anemia, unspecified: Secondary | ICD-10-CM | POA: Diagnosis not present

## 2020-02-20 DIAGNOSIS — E440 Moderate protein-calorie malnutrition: Secondary | ICD-10-CM | POA: Diagnosis not present

## 2020-02-22 ENCOUNTER — Other Ambulatory Visit: Payer: Self-pay

## 2020-02-22 MED ORDER — FREESTYLE LIBRE 14 DAY READER DEVI
3 refills | Status: DC
Start: 2020-02-22 — End: 2021-02-04

## 2020-02-22 MED ORDER — FREESTYLE LIBRE 14 DAY SENSOR MISC
3 refills | Status: DC
Start: 2020-02-22 — End: 2021-02-04

## 2020-02-23 ENCOUNTER — Telehealth: Payer: Medicare Other

## 2020-02-23 ENCOUNTER — Ambulatory Visit: Payer: Medicare Other

## 2020-02-23 ENCOUNTER — Ambulatory Visit (INDEPENDENT_AMBULATORY_CARE_PROVIDER_SITE_OTHER): Payer: Medicare Other

## 2020-02-23 ENCOUNTER — Other Ambulatory Visit: Payer: Self-pay

## 2020-02-23 DIAGNOSIS — E1122 Type 2 diabetes mellitus with diabetic chronic kidney disease: Secondary | ICD-10-CM | POA: Diagnosis not present

## 2020-02-23 DIAGNOSIS — I129 Hypertensive chronic kidney disease with stage 1 through stage 4 chronic kidney disease, or unspecified chronic kidney disease: Secondary | ICD-10-CM | POA: Diagnosis not present

## 2020-02-23 DIAGNOSIS — N184 Chronic kidney disease, stage 4 (severe): Secondary | ICD-10-CM

## 2020-02-23 DIAGNOSIS — I10 Essential (primary) hypertension: Secondary | ICD-10-CM | POA: Diagnosis not present

## 2020-02-23 DIAGNOSIS — R251 Tremor, unspecified: Secondary | ICD-10-CM | POA: Diagnosis not present

## 2020-02-23 DIAGNOSIS — G9341 Metabolic encephalopathy: Secondary | ICD-10-CM | POA: Diagnosis not present

## 2020-02-23 NOTE — Chronic Care Management (AMB) (Addendum)
Chronic Care Management   Initial Visit Note  02/23/2020 Name: Miranda Roman MRN: [id number] DOB: [birth year]  Referred by: Glendale Chard, MD Reason for referral : Chronic Care Management (CCM introduction & Enrollment)   Miranda Roman is a 74 y.o. year old female who is a primary care patient of Glendale Chard, MD. The care management team was consulted for assistance with chronic disease management and care coordination needs related to HTN, DMII, CKD Stage IV and Hypertension Nephropathy.  Review of patient status, including review of consultants reports, relevant laboratory and other test results, and collaboration with appropriate care team members and the patient's provider was performed as part of comprehensive patient evaluation and provision of chronic care management services.    I initiated and established the plan of care for Miranda Roman during one on one collaboration with my clinical care management colleague Daneen Schick BSW who is also engaged with this patient to address social work needs.   Outpatient Encounter Medications as of 02/23/2020  Medication Sig  . acetaminophen (TYLENOL) 500 MG tablet Take 1 tablet (500 mg total) by mouth every 6 (six) hours as needed for moderate pain or fever.  [name] amLODipine (NORVASC) 10 MG tablet Take 1 tablet (10 mg total) by mouth daily.  . Continuous Blood Gluc Receiver (FREESTYLE LIBRE 14 DAY READER) DEVI Use as directed to check blood sugars 4 times per day dx: e11.65  . Continuous Blood Gluc Sensor (FREESTYLE LIBRE 14 DAY SENSOR) MISC Use as directed to check blood sugars 4 times per day dx: e11.65  . diclofenac Sodium (VOLTAREN) 1 % GEL Apply 2 g topically 4 (four) times daily. Apply to left knee.  . DULoxetine (CYMBALTA) 60 MG capsule Take 1 capsule (60 mg total) by mouth daily.  . feeding supplement, ENSURE ENLIVE, (ENSURE ENLIVE) LIQD Take 237 mLs by mouth 3 (three) times daily between meals.  [name] glucose blood (ONETOUCH  VERIO) test strip Use as instructed to check blood sugars 1 time per day e11.22  . insulin aspart (NOVOLOG) 100 UNIT/ML injection Inject 0-15 Units into the skin 3 (three) times daily with meals. Sliding scale insulin Less than 70 initiate hypoglycemia protocol 70-120  0 units 120-150 2 unit 151-200 3 units 201-250 5 units 251-300 8 units 301-350 11 units 351-400 15 units  Greater than 400 call MD  . insulin aspart (NOVOLOG) 100 UNIT/ML injection Inject 4 Units into the skin 3 (three) times daily with meals.  . insulin detemir (LEVEMIR) 100 UNIT/ML injection Inject 0.12 mLs (12 Units total) into the skin 2 (two) times daily.  [name] lidocaine (LIDODERM) 5 % Place 1 patch onto the skin daily. Remove & Discard patch within 12 hours or as directed by MD  . loratadine (CLARITIN) 10 MG tablet Take 1 tablet (10 mg total) by mouth daily.  . metoprolol tartrate (LOPRESSOR) 100 MG tablet Take 1 tablet (100 mg total) by mouth 2 (two) times daily.  . Nutritional Supplements (,FEEDING SUPPLEMENT, PROSOURCE PLUS) liquid Take 30 mLs by mouth 2 (two) times daily between meals.  Glory Rosebush DELICA LANCETS 95M MISC 1 each by Does not apply route daily. DX: E11.65  . pantoprazole (PROTONIX) 40 MG tablet Take 1 tablet (40 mg total) by mouth daily.  . polyethylene glycol (MIRALAX / GLYCOLAX) 17 g packet Take 17 g by mouth daily as needed for moderate constipation or severe constipation.  . senna (SENOKOT) 8.6 MG TABS tablet Take 1 tablet (8.6 mg total) by mouth 2 (two)  times daily.  [name] thiamine 100 MG tablet Take 1 tablet (100 mg total) by mouth daily.   No facility-administered encounter medications on file as of 02/23/2020.     Goals Addressed    . Assist with Chronic Care Management and Care Coordination Needs       CARE PLAN ENTRY (see longtitudinal plan of care for additional care plan information)   Current Barriers:  . Chronic Disease Management support, education, and care coordination needs related to  HTN, DMII, CKD Stage IV, and Hypertensive Nephropathy  Case Manager Clinical Goal(s):  [name] Over the next 90 days, patient will work with CCM team to address needs related to Level of care concerns, ADL/IADL limitations, Care Coordination needs and chronic disease support and education in patient with HTN, DMII, CKD Stage IV, and Hypertensive Nephropathy  Interventions:  . Collaborated with patient's daughter and primary caregiver Miranda Roman for CCM face to face introduction and enrollment into the CCM program  . 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 Determined patient also has moderate protein caloric malnutrition and dysphagia that is improving, she is tolerating a po diet per family's request and a registered dietician is following her . Determined current A1c is 9.1 %, current DM regimen includes: o Levemir, sliding scale insulin NovoLog, NovoLog 4 units 3 times daily meal coverage.  CBG monitoring. . Determined patient's Hypertension is well controlled with the current treatment regimen: o amlodipine, metoprolol . Noted per chart review, Left knee pain-patient has warmth and tenderness of left knee.  X-ray of the knee showed no acute normality.  Likely gout.  Improved with ibuprofen.  CBC done today shows normal WBC . Noted per chart review; Goals of care-palliative care consulted.  Patient is full code.  Patient Self Care Activities:  . Supportive family to assist with care needs upon patient's discharge home   Initial goal documentation       Miranda Roman was given information about Chronic Care Management services today including:  1. CCM service includes personalized support from designated clinical staff supervised by her physician, including individualized plan of  care and coordination with other care providers 2. 24/7 contact phone numbers for assistance for urgent and routine care needs. 3. Service will only be billed when office clinical staff spend 20 minutes or more in a month to coordinate care. 4. Only one practitioner may furnish and bill the service in a calendar month. 5. The patient may stop CCM services at any time (effective at the end of the month) by phone call to the office staff. 6. The patient will be responsible for cost sharing (co-pay) of up to 20% of the service fee (after annual deductible is met).  Patient's daughter and caregiver Miranda Roman agreed to services and verbal consent obtained.    The patient/caregiver will call a CCM team member as advised upon patients discharge home from SNF. Daughter Miranda Roman was provided the contact number for this RNCM and the embedded BSW [facility].    Barb Merino, RN, BSN, CCM Care Management Coordinator [place] [facility]  Direct Phone: [phone]

## 2020-03-14 DIAGNOSIS — M79641 Pain in right hand: Secondary | ICD-10-CM | POA: Diagnosis not present

## 2020-03-14 DIAGNOSIS — N184 Chronic kidney disease, stage 4 (severe): Secondary | ICD-10-CM | POA: Diagnosis not present

## 2020-03-14 DIAGNOSIS — R5381 Other malaise: Secondary | ICD-10-CM | POA: Diagnosis not present

## 2020-03-14 DIAGNOSIS — E440 Moderate protein-calorie malnutrition: Secondary | ICD-10-CM | POA: Diagnosis not present

## 2020-03-15 DIAGNOSIS — E440 Moderate protein-calorie malnutrition: Secondary | ICD-10-CM | POA: Diagnosis not present

## 2020-03-15 DIAGNOSIS — E119 Type 2 diabetes mellitus without complications: Secondary | ICD-10-CM | POA: Diagnosis not present

## 2020-03-19 DIAGNOSIS — I2510 Atherosclerotic heart disease of native coronary artery without angina pectoris: Secondary | ICD-10-CM | POA: Diagnosis not present

## 2020-03-19 DIAGNOSIS — Z794 Long term (current) use of insulin: Secondary | ICD-10-CM | POA: Diagnosis not present

## 2020-03-19 DIAGNOSIS — E440 Moderate protein-calorie malnutrition: Secondary | ICD-10-CM | POA: Diagnosis not present

## 2020-03-19 DIAGNOSIS — I129 Hypertensive chronic kidney disease with stage 1 through stage 4 chronic kidney disease, or unspecified chronic kidney disease: Secondary | ICD-10-CM | POA: Diagnosis not present

## 2020-03-19 DIAGNOSIS — Z9884 Bariatric surgery status: Secondary | ICD-10-CM | POA: Diagnosis not present

## 2020-03-19 DIAGNOSIS — G9341 Metabolic encephalopathy: Secondary | ICD-10-CM | POA: Diagnosis not present

## 2020-03-19 DIAGNOSIS — E1122 Type 2 diabetes mellitus with diabetic chronic kidney disease: Secondary | ICD-10-CM | POA: Diagnosis not present

## 2020-03-19 DIAGNOSIS — R1310 Dysphagia, unspecified: Secondary | ICD-10-CM | POA: Diagnosis not present

## 2020-03-19 DIAGNOSIS — E1151 Type 2 diabetes mellitus with diabetic peripheral angiopathy without gangrene: Secondary | ICD-10-CM | POA: Diagnosis not present

## 2020-03-19 DIAGNOSIS — I272 Pulmonary hypertension, unspecified: Secondary | ICD-10-CM | POA: Diagnosis not present

## 2020-03-19 DIAGNOSIS — N184 Chronic kidney disease, stage 4 (severe): Secondary | ICD-10-CM | POA: Diagnosis not present

## 2020-03-20 ENCOUNTER — Other Ambulatory Visit: Payer: Self-pay

## 2020-03-20 DIAGNOSIS — E1122 Type 2 diabetes mellitus with diabetic chronic kidney disease: Secondary | ICD-10-CM | POA: Diagnosis not present

## 2020-03-20 DIAGNOSIS — N184 Chronic kidney disease, stage 4 (severe): Secondary | ICD-10-CM | POA: Diagnosis not present

## 2020-03-20 DIAGNOSIS — E440 Moderate protein-calorie malnutrition: Secondary | ICD-10-CM | POA: Diagnosis not present

## 2020-03-20 DIAGNOSIS — D696 Thrombocytopenia, unspecified: Secondary | ICD-10-CM | POA: Diagnosis not present

## 2020-03-20 DIAGNOSIS — H2513 Age-related nuclear cataract, bilateral: Secondary | ICD-10-CM | POA: Diagnosis not present

## 2020-03-20 DIAGNOSIS — I129 Hypertensive chronic kidney disease with stage 1 through stage 4 chronic kidney disease, or unspecified chronic kidney disease: Secondary | ICD-10-CM | POA: Diagnosis not present

## 2020-03-20 DIAGNOSIS — H538 Other visual disturbances: Secondary | ICD-10-CM | POA: Diagnosis not present

## 2020-03-20 DIAGNOSIS — E119 Type 2 diabetes mellitus without complications: Secondary | ICD-10-CM | POA: Diagnosis not present

## 2020-03-20 DIAGNOSIS — Z794 Long term (current) use of insulin: Secondary | ICD-10-CM | POA: Diagnosis not present

## 2020-03-20 DIAGNOSIS — I272 Pulmonary hypertension, unspecified: Secondary | ICD-10-CM | POA: Diagnosis not present

## 2020-03-20 DIAGNOSIS — E1151 Type 2 diabetes mellitus with diabetic peripheral angiopathy without gangrene: Secondary | ICD-10-CM | POA: Diagnosis not present

## 2020-03-20 DIAGNOSIS — Z9884 Bariatric surgery status: Secondary | ICD-10-CM | POA: Diagnosis not present

## 2020-03-20 DIAGNOSIS — G9341 Metabolic encephalopathy: Secondary | ICD-10-CM | POA: Diagnosis not present

## 2020-03-20 DIAGNOSIS — I2510 Atherosclerotic heart disease of native coronary artery without angina pectoris: Secondary | ICD-10-CM | POA: Diagnosis not present

## 2020-03-20 DIAGNOSIS — E1136 Type 2 diabetes mellitus with diabetic cataract: Secondary | ICD-10-CM | POA: Diagnosis not present

## 2020-03-20 DIAGNOSIS — R1310 Dysphagia, unspecified: Secondary | ICD-10-CM | POA: Diagnosis not present

## 2020-03-20 LAB — HM DIABETES EYE EXAM

## 2020-03-20 NOTE — Progress Notes (Signed)
THE PT'S MEDICATION LIST WAS VERIFIED BY HER DAUGHTER [name] MORRISON.

## 2020-03-21 DIAGNOSIS — E1151 Type 2 diabetes mellitus with diabetic peripheral angiopathy without gangrene: Secondary | ICD-10-CM | POA: Diagnosis not present

## 2020-03-21 DIAGNOSIS — Z9884 Bariatric surgery status: Secondary | ICD-10-CM | POA: Diagnosis not present

## 2020-03-21 DIAGNOSIS — G9341 Metabolic encephalopathy: Secondary | ICD-10-CM | POA: Diagnosis not present

## 2020-03-21 DIAGNOSIS — I272 Pulmonary hypertension, unspecified: Secondary | ICD-10-CM | POA: Diagnosis not present

## 2020-03-21 DIAGNOSIS — Z794 Long term (current) use of insulin: Secondary | ICD-10-CM | POA: Diagnosis not present

## 2020-03-21 DIAGNOSIS — E440 Moderate protein-calorie malnutrition: Secondary | ICD-10-CM | POA: Diagnosis not present

## 2020-03-21 DIAGNOSIS — E1122 Type 2 diabetes mellitus with diabetic chronic kidney disease: Secondary | ICD-10-CM | POA: Diagnosis not present

## 2020-03-21 DIAGNOSIS — I129 Hypertensive chronic kidney disease with stage 1 through stage 4 chronic kidney disease, or unspecified chronic kidney disease: Secondary | ICD-10-CM | POA: Diagnosis not present

## 2020-03-21 DIAGNOSIS — R1310 Dysphagia, unspecified: Secondary | ICD-10-CM | POA: Diagnosis not present

## 2020-03-21 DIAGNOSIS — I2510 Atherosclerotic heart disease of native coronary artery without angina pectoris: Secondary | ICD-10-CM | POA: Diagnosis not present

## 2020-03-21 DIAGNOSIS — N184 Chronic kidney disease, stage 4 (severe): Secondary | ICD-10-CM | POA: Diagnosis not present

## 2020-03-22 ENCOUNTER — Telehealth: Payer: Self-pay

## 2020-03-22 DIAGNOSIS — Z9884 Bariatric surgery status: Secondary | ICD-10-CM | POA: Diagnosis not present

## 2020-03-22 DIAGNOSIS — I272 Pulmonary hypertension, unspecified: Secondary | ICD-10-CM | POA: Diagnosis not present

## 2020-03-22 DIAGNOSIS — D696 Thrombocytopenia, unspecified: Secondary | ICD-10-CM | POA: Diagnosis not present

## 2020-03-22 DIAGNOSIS — E1122 Type 2 diabetes mellitus with diabetic chronic kidney disease: Secondary | ICD-10-CM | POA: Diagnosis not present

## 2020-03-22 DIAGNOSIS — Z794 Long term (current) use of insulin: Secondary | ICD-10-CM | POA: Diagnosis not present

## 2020-03-22 DIAGNOSIS — E440 Moderate protein-calorie malnutrition: Secondary | ICD-10-CM | POA: Diagnosis not present

## 2020-03-22 DIAGNOSIS — E1151 Type 2 diabetes mellitus with diabetic peripheral angiopathy without gangrene: Secondary | ICD-10-CM | POA: Diagnosis not present

## 2020-03-22 DIAGNOSIS — R1310 Dysphagia, unspecified: Secondary | ICD-10-CM | POA: Diagnosis not present

## 2020-03-22 DIAGNOSIS — E119 Type 2 diabetes mellitus without complications: Secondary | ICD-10-CM | POA: Diagnosis not present

## 2020-03-22 DIAGNOSIS — I2510 Atherosclerotic heart disease of native coronary artery without angina pectoris: Secondary | ICD-10-CM | POA: Diagnosis not present

## 2020-03-22 DIAGNOSIS — I129 Hypertensive chronic kidney disease with stage 1 through stage 4 chronic kidney disease, or unspecified chronic kidney disease: Secondary | ICD-10-CM | POA: Diagnosis not present

## 2020-03-22 DIAGNOSIS — G9341 Metabolic encephalopathy: Secondary | ICD-10-CM | POA: Diagnosis not present

## 2020-03-22 DIAGNOSIS — N184 Chronic kidney disease, stage 4 (severe): Secondary | ICD-10-CM | POA: Diagnosis not present

## 2020-03-22 NOTE — Telephone Encounter (Signed)
The pt's sister Miranda Roman said that she was returning a call that the pt received.  She was told that Dr. Baird Cancer wanted the pt to come for a f/u.  Miranda Roman said that she would have the pt's daughter [name] to call back and schedule the appt.

## 2020-03-22 NOTE — Telephone Encounter (Signed)
I called to schedule the pt an appt for a follow-up with D. Sanders. (30 mins appt @ 4:15 pm)

## 2020-03-23 ENCOUNTER — Telehealth: Payer: Medicare Other

## 2020-03-23 ENCOUNTER — Telehealth: Payer: Self-pay

## 2020-03-23 DIAGNOSIS — N184 Chronic kidney disease, stage 4 (severe): Secondary | ICD-10-CM | POA: Diagnosis not present

## 2020-03-23 DIAGNOSIS — I129 Hypertensive chronic kidney disease with stage 1 through stage 4 chronic kidney disease, or unspecified chronic kidney disease: Secondary | ICD-10-CM | POA: Diagnosis not present

## 2020-03-23 DIAGNOSIS — E1151 Type 2 diabetes mellitus with diabetic peripheral angiopathy without gangrene: Secondary | ICD-10-CM | POA: Diagnosis not present

## 2020-03-23 DIAGNOSIS — E1122 Type 2 diabetes mellitus with diabetic chronic kidney disease: Secondary | ICD-10-CM | POA: Diagnosis not present

## 2020-03-23 DIAGNOSIS — Z794 Long term (current) use of insulin: Secondary | ICD-10-CM | POA: Diagnosis not present

## 2020-03-23 DIAGNOSIS — R1310 Dysphagia, unspecified: Secondary | ICD-10-CM | POA: Diagnosis not present

## 2020-03-23 DIAGNOSIS — I272 Pulmonary hypertension, unspecified: Secondary | ICD-10-CM | POA: Diagnosis not present

## 2020-03-23 DIAGNOSIS — Z9884 Bariatric surgery status: Secondary | ICD-10-CM | POA: Diagnosis not present

## 2020-03-23 DIAGNOSIS — G9341 Metabolic encephalopathy: Secondary | ICD-10-CM | POA: Diagnosis not present

## 2020-03-23 DIAGNOSIS — E440 Moderate protein-calorie malnutrition: Secondary | ICD-10-CM | POA: Diagnosis not present

## 2020-03-23 DIAGNOSIS — I2510 Atherosclerotic heart disease of native coronary artery without angina pectoris: Secondary | ICD-10-CM | POA: Diagnosis not present

## 2020-03-23 NOTE — Telephone Encounter (Signed)
  Chronic Care Management   Outreach Note  03/23/2020 Name: Miranda Roman MRN: [id number] DOB: 1946-02-12  Referred by: Glendale Chard, MD Reason for referral : Care Coordination   An unsuccessful telephone outreach was attempted today. The patient was referred to the case management team for assistance with care management and care coordination.   Follow Up Plan: The care management team will reach out to the patient again over the next 7 days.   Daneen Schick, BSW, CDP Social Worker, Certified Dementia Practitioner [place] [facility] [place] [facility] [phone]

## 2020-03-26 ENCOUNTER — Other Ambulatory Visit: Payer: Self-pay

## 2020-03-26 ENCOUNTER — Ambulatory Visit: Payer: Medicare Other

## 2020-03-26 DIAGNOSIS — E1151 Type 2 diabetes mellitus with diabetic peripheral angiopathy without gangrene: Secondary | ICD-10-CM | POA: Diagnosis not present

## 2020-03-26 DIAGNOSIS — N184 Chronic kidney disease, stage 4 (severe): Secondary | ICD-10-CM | POA: Diagnosis not present

## 2020-03-26 DIAGNOSIS — Z9884 Bariatric surgery status: Secondary | ICD-10-CM | POA: Diagnosis not present

## 2020-03-26 DIAGNOSIS — I129 Hypertensive chronic kidney disease with stage 1 through stage 4 chronic kidney disease, or unspecified chronic kidney disease: Secondary | ICD-10-CM | POA: Diagnosis not present

## 2020-03-26 DIAGNOSIS — I10 Essential (primary) hypertension: Secondary | ICD-10-CM

## 2020-03-26 DIAGNOSIS — E1122 Type 2 diabetes mellitus with diabetic chronic kidney disease: Secondary | ICD-10-CM | POA: Diagnosis not present

## 2020-03-26 DIAGNOSIS — G9341 Metabolic encephalopathy: Secondary | ICD-10-CM | POA: Diagnosis not present

## 2020-03-26 DIAGNOSIS — I272 Pulmonary hypertension, unspecified: Secondary | ICD-10-CM | POA: Diagnosis not present

## 2020-03-26 DIAGNOSIS — I2510 Atherosclerotic heart disease of native coronary artery without angina pectoris: Secondary | ICD-10-CM | POA: Diagnosis not present

## 2020-03-26 DIAGNOSIS — R1319 Other dysphagia: Secondary | ICD-10-CM

## 2020-03-26 DIAGNOSIS — E440 Moderate protein-calorie malnutrition: Secondary | ICD-10-CM | POA: Diagnosis not present

## 2020-03-26 DIAGNOSIS — Z794 Long term (current) use of insulin: Secondary | ICD-10-CM | POA: Diagnosis not present

## 2020-03-26 DIAGNOSIS — R1310 Dysphagia, unspecified: Secondary | ICD-10-CM | POA: Diagnosis not present

## 2020-03-26 MED ORDER — DULOXETINE HCL 60 MG PO CPEP: 60.0000 mg | ORAL_CAPSULE | Freq: Every day | ORAL | 0 refills | Status: DC

## 2020-03-26 NOTE — Chronic Care Management (AMB) (Signed)
Chronic Care Management    Social Work General Note  03/26/2020 Name: Miranda Roman MRN: [id number] DOB: [birth year]  Miranda Roman is a 74 y.o. year old female who is a primary care patient of Glendale Chard, MD. The CCM was consulted to assist the patient with care coordination.   Review of patient status, including review of consultants reports, relevant laboratory and other test results, and collaboration with appropriate care team members and the patient's provider was performed as part of comprehensive patient evaluation and provision of chronic care management services.    SDOH (Social Determinants of Health) assessments and interventions performed:  No   SW placed a brief call to the patients daughter and caregiver to begin SW assessment. The family was actively interviewing a caregiver which kept today's call short.    Outpatient Encounter Medications as of 03/26/2020  Medication Sig  . acetaminophen (TYLENOL) 500 MG tablet Take 1 tablet (500 mg total) by mouth every 6 (six) hours as needed for moderate pain or fever.  [name] amLODipine (NORVASC) 10 MG tablet Take 1 tablet (10 mg total) by mouth daily.  . Continuous Blood Gluc Receiver (FREESTYLE LIBRE 14 DAY READER) DEVI Use as directed to check blood sugars 4 times per day dx: e11.65  . Continuous Blood Gluc Sensor (FREESTYLE LIBRE 14 DAY SENSOR) MISC Use as directed to check blood sugars 4 times per day dx: e11.65  . diclofenac Sodium (VOLTAREN) 1 % GEL Apply 2 g topically 4 (four) times daily. Apply to left knee.  . DULoxetine (CYMBALTA) 60 MG capsule Take 1 capsule (60 mg total) by mouth daily.  [name] glucose blood (ONETOUCH VERIO) test strip Use as instructed to check blood sugars 1 time per day e11.22  . insulin aspart (NOVOLOG) 100 UNIT/ML injection Inject 0-15 Units into the skin 3 (three) times daily with meals. Sliding scale insulin Less than 70 initiate hypoglycemia protocol 70-120  0 units 120-150 2 unit 151-200 3  units 201-250 5 units 251-300 8 units 301-350 11 units 351-400 15 units  Greater than 400 call MD  . insulin aspart (NOVOLOG) 100 UNIT/ML injection Inject 4 Units into the skin 3 (three) times daily with meals.  . insulin detemir (LEVEMIR) 100 UNIT/ML injection Inject 0.12 mLs (12 Units total) into the skin 2 (two) times daily. (Patient taking differently: Inject 12 Units into the skin 2 (two) times daily. 3 UNITS IN THE AM AND 6 UNITS IN THE PM)  . lidocaine (LIDODERM) 5 % Place 1 patch onto the skin daily. Remove & Discard patch within 12 hours or as directed by MD (Patient not taking: Reported on 03/20/2020)  . loratadine (CLARITIN) 10 MG tablet Take 1 tablet (10 mg total) by mouth daily.  . metoprolol tartrate (LOPRESSOR) 100 MG tablet Take 1 tablet (100 mg total) by mouth 2 (two) times daily.  . Nutritional Supplements (,FEEDING SUPPLEMENT, PROSOURCE PLUS) liquid Take 30 mLs by mouth 2 (two) times daily between meals.  Glory Rosebush DELICA LANCETS 86V MISC 1 each by Does not apply route daily. DX: E11.65 (Patient not taking: Reported on 03/20/2020)  . pantoprazole (PROTONIX) 40 MG tablet Take 1 tablet (40 mg total) by mouth daily.  . polyethylene glycol (MIRALAX / GLYCOLAX) 17 g packet Take 17 g by mouth daily as needed for moderate constipation or severe constipation.  . senna (SENOKOT) 8.6 MG TABS tablet Take 1 tablet (8.6 mg total) by mouth 2 (two) times daily.  [name] thiamine 100 MG tablet Take 1 tablet (  100 mg total) by mouth daily.   No facility-administered encounter medications on file as of 03/26/2020.    Goals Addressed            This Visit's Progress   . Collaborate with RN Care Manager to perform appropriate assessments to assist with care coordination needs post SNF discharge       CARE PLAN ENTRY (see longitudinal plan of care for additional care plan information)  Current Barriers:  . Limited access to caregiver . Recent discharge from SNF following a greater than 45 day  hospital stay . Chronic conditions including DM II, Stage 4 CKD, HTN, and Hypertensive Nephropathy which put patient at increased rick for hospitalization  Social Work Clinical Goal(s):  [name] Over the next 60 days the patient and her family will work with care management team to address care coordination needs.  CCM SW Interventions: Completed 03/26/20 with Tedra Coupe . Inter-disciplinary care team collaboration (see longitudinal plan of care) . Successful outbound call placed to the patients daughter and caregiver, Tedra Coupe, in response to a voice message left for [place] regarding caregiver resources . Determined the family was actively interviewing "[employer]" agency for a caregiver at the time of SW call o Discussed family concern of finding a "good" agency to provide services to the patient. Family requested SW feedback on [employer] o Advised Mrs. Randol Kern SW has not heard good or bad feedback regarding this agency care.  o Informed [name] of current barriers to finding caregivers due to COVID 19 pandemic. Discussed importance of hiring an agency if a caregiver is available and the family feels it is a good fit . Briefly discussed patient current in home DME: Patient has a wheelchair, walker, and bedside commode. No other DME needs are identified at this time o SW to follow up in 1 week to assess for newly identified needs . Patient is currently active with [facility] for PT, OT, and ST o Discussed the patient is not active with an RN for home health services o Briefly provided education surrounding ability to add nursing if needed o Advised Mrs. Randol Kern the therapy team can and will add a nurse to the patients care team as needed . Collaboration with [place] to inform of above interventions and plan o RN Care Manager agrees with home health agency managing disciplines needed for patients care at this time as they are in the home and  can assess for patient needs . Scheduled follow up call over the next week  Patient Self Care Activities: (With the help of patient family) . Self administers medications as prescribed . Attends all scheduled provider appointments . Calls provider office for new concerns or questions  Initial goal documentation         Follow Up Plan: SW will follow up with patient by phone over the next week to complete SW assessment.      Daneen Schick, BSW, CDP Social Worker, Certified Dementia Practitioner [place] [facility] [place] [facility] [phone]  Total time spent performing care coordination and/or care management activities with the patient by phone or face to face = 18 minutes.

## 2020-03-26 NOTE — Patient Instructions (Signed)
Social Worker Visit Information  Goals we discussed today:  Goals Addressed            This Visit's Progress   . Collaborate with RN Care Manager to perform appropriate assessments to assist with care coordination needs post SNF discharge       CARE PLAN ENTRY (see longitudinal plan of care for additional care plan information)  Current Barriers:  . Limited access to caregiver . Recent discharge from SNF following a greater than 45 day hospital stay . Chronic conditions including DM II, Stage 4 CKD, HTN, and Hypertensive Nephropathy which put patient at increased rick for hospitalization  Social Work Clinical Goal(s):  [name] Over the next 60 days the patient and her family will work with care management team to address care coordination needs.  CCM SW Interventions: Completed 03/26/20 with Tedra Coupe . Inter-disciplinary care team collaboration (see longitudinal plan of care) . Successful outbound call placed to the patients daughter and caregiver, Tedra Coupe, in response to a voice message left for [place] regarding caregiver resources . Determined the family was actively interviewing "[employer]" agency for a caregiver at the time of SW call o Discussed family concern of finding a "good" agency to provide services to the patient. Family requested SW feedback on [employer] o Advised Mrs. Randol Kern SW has not heard good or bad feedback regarding this agency care.  o Informed [name] of current barriers to finding caregivers due to COVID 19 pandemic. Discussed importance of hiring an agency if a caregiver is available and the family feels it is a good fit . Briefly discussed patient current in home DME: Patient has a wheelchair, walker, and bedside commode. No other DME needs are identified at this time o SW to follow up in 1 week to assess for newly identified needs . Patient is currently active with [facility] for PT, OT, and ST o Discussed the patient is  not active with an RN for home health services o Briefly provided education surrounding ability to add nursing if needed o Advised Mrs. Randol Kern the therapy team can and will add a nurse to the patients care team as needed . Collaboration with [place] to inform of above interventions and plan o RN Care Manager agrees with home health agency managing disciplines needed for patients care at this time as they are in the home and can assess for patient needs . Scheduled follow up call over the next week  Patient Self Care Activities: (With the help of patient family) . Self administers medications as prescribed . Attends all scheduled provider appointments . Calls provider office for new concerns or questions  Initial goal documentation         Follow Up Plan: SW will follow up with patient by phone over the next week.   Daneen Schick, BSW, CDP Social Worker, Certified Dementia Practitioner [place] [facility] [place] [facility] [phone]

## 2020-03-28 DIAGNOSIS — R1310 Dysphagia, unspecified: Secondary | ICD-10-CM | POA: Diagnosis not present

## 2020-03-28 DIAGNOSIS — Z9884 Bariatric surgery status: Secondary | ICD-10-CM | POA: Diagnosis not present

## 2020-03-28 DIAGNOSIS — I272 Pulmonary hypertension, unspecified: Secondary | ICD-10-CM | POA: Diagnosis not present

## 2020-03-28 DIAGNOSIS — E1151 Type 2 diabetes mellitus with diabetic peripheral angiopathy without gangrene: Secondary | ICD-10-CM | POA: Diagnosis not present

## 2020-03-28 DIAGNOSIS — E1122 Type 2 diabetes mellitus with diabetic chronic kidney disease: Secondary | ICD-10-CM | POA: Diagnosis not present

## 2020-03-28 DIAGNOSIS — I129 Hypertensive chronic kidney disease with stage 1 through stage 4 chronic kidney disease, or unspecified chronic kidney disease: Secondary | ICD-10-CM | POA: Diagnosis not present

## 2020-03-28 DIAGNOSIS — G9341 Metabolic encephalopathy: Secondary | ICD-10-CM | POA: Diagnosis not present

## 2020-03-28 DIAGNOSIS — N184 Chronic kidney disease, stage 4 (severe): Secondary | ICD-10-CM | POA: Diagnosis not present

## 2020-03-28 DIAGNOSIS — Z794 Long term (current) use of insulin: Secondary | ICD-10-CM | POA: Diagnosis not present

## 2020-03-28 DIAGNOSIS — E440 Moderate protein-calorie malnutrition: Secondary | ICD-10-CM | POA: Diagnosis not present

## 2020-03-28 DIAGNOSIS — I2510 Atherosclerotic heart disease of native coronary artery without angina pectoris: Secondary | ICD-10-CM | POA: Diagnosis not present

## 2020-03-30 ENCOUNTER — Telehealth: Payer: Self-pay

## 2020-03-30 DIAGNOSIS — E440 Moderate protein-calorie malnutrition: Secondary | ICD-10-CM | POA: Diagnosis not present

## 2020-03-30 DIAGNOSIS — I2510 Atherosclerotic heart disease of native coronary artery without angina pectoris: Secondary | ICD-10-CM | POA: Diagnosis not present

## 2020-03-30 DIAGNOSIS — N184 Chronic kidney disease, stage 4 (severe): Secondary | ICD-10-CM | POA: Diagnosis not present

## 2020-03-30 DIAGNOSIS — I129 Hypertensive chronic kidney disease with stage 1 through stage 4 chronic kidney disease, or unspecified chronic kidney disease: Secondary | ICD-10-CM | POA: Diagnosis not present

## 2020-03-30 DIAGNOSIS — G9341 Metabolic encephalopathy: Secondary | ICD-10-CM | POA: Diagnosis not present

## 2020-03-30 DIAGNOSIS — Z794 Long term (current) use of insulin: Secondary | ICD-10-CM | POA: Diagnosis not present

## 2020-03-30 DIAGNOSIS — E1151 Type 2 diabetes mellitus with diabetic peripheral angiopathy without gangrene: Secondary | ICD-10-CM | POA: Diagnosis not present

## 2020-03-30 DIAGNOSIS — Z9884 Bariatric surgery status: Secondary | ICD-10-CM | POA: Diagnosis not present

## 2020-03-30 DIAGNOSIS — R1310 Dysphagia, unspecified: Secondary | ICD-10-CM | POA: Diagnosis not present

## 2020-03-30 DIAGNOSIS — E1122 Type 2 diabetes mellitus with diabetic chronic kidney disease: Secondary | ICD-10-CM | POA: Diagnosis not present

## 2020-03-30 DIAGNOSIS — I272 Pulmonary hypertension, unspecified: Secondary | ICD-10-CM | POA: Diagnosis not present

## 2020-03-30 NOTE — Telephone Encounter (Signed)
The pt's son was notified to come by the office to pickup some freestyle libre sensors because the office needed new office notes to send with the pt;s prescription to [facility] and the pt's next appt isn't until 10/04

## 2020-04-02 ENCOUNTER — Telehealth: Payer: Self-pay

## 2020-04-02 ENCOUNTER — Ambulatory Visit: Payer: Medicare Other | Admitting: Internal Medicine

## 2020-04-02 ENCOUNTER — Telehealth: Payer: Medicare Other

## 2020-04-02 DIAGNOSIS — G9341 Metabolic encephalopathy: Secondary | ICD-10-CM | POA: Diagnosis not present

## 2020-04-02 DIAGNOSIS — Z9884 Bariatric surgery status: Secondary | ICD-10-CM | POA: Diagnosis not present

## 2020-04-02 DIAGNOSIS — E440 Moderate protein-calorie malnutrition: Secondary | ICD-10-CM | POA: Diagnosis not present

## 2020-04-02 DIAGNOSIS — N184 Chronic kidney disease, stage 4 (severe): Secondary | ICD-10-CM | POA: Diagnosis not present

## 2020-04-02 DIAGNOSIS — I272 Pulmonary hypertension, unspecified: Secondary | ICD-10-CM | POA: Diagnosis not present

## 2020-04-02 DIAGNOSIS — E1151 Type 2 diabetes mellitus with diabetic peripheral angiopathy without gangrene: Secondary | ICD-10-CM | POA: Diagnosis not present

## 2020-04-02 DIAGNOSIS — Z794 Long term (current) use of insulin: Secondary | ICD-10-CM | POA: Diagnosis not present

## 2020-04-02 DIAGNOSIS — E1122 Type 2 diabetes mellitus with diabetic chronic kidney disease: Secondary | ICD-10-CM | POA: Diagnosis not present

## 2020-04-02 DIAGNOSIS — I2510 Atherosclerotic heart disease of native coronary artery without angina pectoris: Secondary | ICD-10-CM | POA: Diagnosis not present

## 2020-04-02 DIAGNOSIS — I129 Hypertensive chronic kidney disease with stage 1 through stage 4 chronic kidney disease, or unspecified chronic kidney disease: Secondary | ICD-10-CM | POA: Diagnosis not present

## 2020-04-02 DIAGNOSIS — R1310 Dysphagia, unspecified: Secondary | ICD-10-CM | POA: Diagnosis not present

## 2020-04-02 NOTE — Telephone Encounter (Signed)
  Chronic Care Management   Outreach Note  04/02/2020 Name: Miranda Roman MRN: [id number] DOB: 1946-05-04  Referred by: Glendale Chard, MD Reason for referral : Care Coordination   An unsuccessful telephone outreach was attempted today. The patient was referred to the case management team for assistance with care management and care coordination.   Follow Up Plan: The care management team will reach out to the patient again over the next 10 days.   Daneen Schick, BSW, CDP Social Worker, Certified Dementia Practitioner [place] [facility] [phone]

## 2020-04-03 DIAGNOSIS — N184 Chronic kidney disease, stage 4 (severe): Secondary | ICD-10-CM | POA: Diagnosis not present

## 2020-04-03 DIAGNOSIS — I129 Hypertensive chronic kidney disease with stage 1 through stage 4 chronic kidney disease, or unspecified chronic kidney disease: Secondary | ICD-10-CM | POA: Diagnosis not present

## 2020-04-03 DIAGNOSIS — I272 Pulmonary hypertension, unspecified: Secondary | ICD-10-CM | POA: Diagnosis not present

## 2020-04-03 DIAGNOSIS — Z9884 Bariatric surgery status: Secondary | ICD-10-CM | POA: Diagnosis not present

## 2020-04-03 DIAGNOSIS — E440 Moderate protein-calorie malnutrition: Secondary | ICD-10-CM | POA: Diagnosis not present

## 2020-04-03 DIAGNOSIS — E1122 Type 2 diabetes mellitus with diabetic chronic kidney disease: Secondary | ICD-10-CM | POA: Diagnosis not present

## 2020-04-03 DIAGNOSIS — Z794 Long term (current) use of insulin: Secondary | ICD-10-CM | POA: Diagnosis not present

## 2020-04-03 DIAGNOSIS — R1310 Dysphagia, unspecified: Secondary | ICD-10-CM | POA: Diagnosis not present

## 2020-04-03 DIAGNOSIS — G9341 Metabolic encephalopathy: Secondary | ICD-10-CM | POA: Diagnosis not present

## 2020-04-03 DIAGNOSIS — I2510 Atherosclerotic heart disease of native coronary artery without angina pectoris: Secondary | ICD-10-CM | POA: Diagnosis not present

## 2020-04-03 DIAGNOSIS — E1151 Type 2 diabetes mellitus with diabetic peripheral angiopathy without gangrene: Secondary | ICD-10-CM | POA: Diagnosis not present

## 2020-04-04 ENCOUNTER — Encounter (HOSPITAL_COMMUNITY): Payer: Self-pay

## 2020-04-04 ENCOUNTER — Emergency Department (HOSPITAL_COMMUNITY)
Admission: EM | Admit: 2020-04-04 | Discharge: 2020-04-04 | Disposition: A | Discharge status: Home / Self Care | Payer: Medicare Other | Attending: Emergency Medicine | Admitting: Emergency Medicine

## 2020-04-04 ENCOUNTER — Other Ambulatory Visit: Payer: Self-pay

## 2020-04-04 ENCOUNTER — Ambulatory Visit (INDEPENDENT_AMBULATORY_CARE_PROVIDER_SITE_OTHER): Payer: Medicare Other | Admitting: Internal Medicine

## 2020-04-04 ENCOUNTER — Encounter: Payer: Self-pay | Admitting: Nurse Practitioner

## 2020-04-04 ENCOUNTER — Emergency Department (HOSPITAL_COMMUNITY): Payer: Medicare Other

## 2020-04-04 ENCOUNTER — Encounter: Payer: Self-pay | Admitting: Internal Medicine

## 2020-04-04 VITALS — BP 136/78 | HR 80 | Temp 98.1°F

## 2020-04-04 DIAGNOSIS — Z5321 Procedure and treatment not carried out due to patient leaving prior to being seen by health care provider: Secondary | ICD-10-CM | POA: Diagnosis not present

## 2020-04-04 DIAGNOSIS — R1084 Generalized abdominal pain: Secondary | ICD-10-CM | POA: Diagnosis not present

## 2020-04-04 DIAGNOSIS — E1122 Type 2 diabetes mellitus with diabetic chronic kidney disease: Secondary | ICD-10-CM | POA: Diagnosis not present

## 2020-04-04 DIAGNOSIS — N184 Chronic kidney disease, stage 4 (severe): Secondary | ICD-10-CM

## 2020-04-04 DIAGNOSIS — I129 Hypertensive chronic kidney disease with stage 1 through stage 4 chronic kidney disease, or unspecified chronic kidney disease: Secondary | ICD-10-CM

## 2020-04-04 DIAGNOSIS — R0789 Other chest pain: Secondary | ICD-10-CM | POA: Diagnosis not present

## 2020-04-04 DIAGNOSIS — M791 Myalgia, unspecified site: Secondary | ICD-10-CM | POA: Diagnosis not present

## 2020-04-04 DIAGNOSIS — R0781 Pleurodynia: Secondary | ICD-10-CM | POA: Diagnosis not present

## 2020-04-04 NOTE — Progress Notes (Signed)
This visit occurred during the SARS-CoV-2 public health emergency.  Safety protocols were in place, including screening questions prior to the visit, additional usage of staff PPE, and extensive cleaning of exam room while observing appropriate contact time as indicated for disinfecting solutions.  Subjective:     Patient ID: Miranda Roman , female    DOB: September 26, 1945 , 74 y.o.   MRN: [id number]   Chief Complaint  Patient presents with  . rib pain    patient is hurting under her right breast pain. patient stated the pain started after she had gotten out of the car yesterday    HPI  She presents today for further evaluation of right sided pain. It hurts underneath her right breast. She is brought in by her daughter today, who is also present for the visit. She is now home with [place] and assistance from her children. She has had a long recent hospitalization - from July 9 through August 11th.  She was initially [facility]  where she originally presented on 12/21/2019 with complaints of chest pain. VQ scan and Dopplers were negative at that time. Cardiac work-up revealed only moderate pulmonary hypertension. Her hospital course was complicated by sudden onset of encephalopathy, leukocytosis and fever. ID and neurology were consulted and patient underwent MRI of the brain and lumbar puncture. Both were unrevealing. EEG was also unrevealing. Her case was complicated by anemia, thrombocytopenia, acute kidney injury with consideration being given to be TTP. She was treated with 5 rounds of plasmapheresis and steroids without improvement. At the request of family she was transferred to [facility]. Work-up at [facility] included negative long-term EEG, negative MRI brain, negative repeat LP. She had repeat course of IVIG x5 days.Eventually, her mentation improved and she was able to advance her diet after speech evaluation. She was then sent to skilled  nursing facility for rehab.   She has been home for several weeks, and has been participating in physical therapy. She has been complaining of right side pain for the past several days. Denies fever/chills. She was diagnosed with gallstones while in the hospital. She states her sx worsened last night after eating some corn with dinner. Denies n/v. Pain is exacerbated with movement. She has been having regular bowel movements.      Past Medical History:  Diagnosis Date  . Anemia   . Diabetes mellitus   . Hyperlipidemia   . Hypertension   . Osteoporosis   . Sickle cell anemia (HCC)      Family History  Problem Relation Age of Onset  . Diabetes Mother   . Heart disease Mother   . Diabetes Father   . Heart disease Father   . Diabetes Sister   . Diabetes Maternal Aunt   . Diabetes Maternal Uncle   . Diabetes Maternal Grandmother   . Diabetes Maternal Grandfather      Current Outpatient Medications:  .  acetaminophen (TYLENOL) 500 MG tablet, Take 1 tablet (500 mg total) by mouth every 6 (six) hours as needed for moderate pain or fever., Disp: 30 tablet, Rfl: 0 .  amLODipine (NORVASC) 10 MG tablet, Take 1 tablet (10 mg total) by mouth daily., Disp: , Rfl:  .  Continuous Blood Gluc Receiver (FREESTYLE LIBRE 14 DAY READER) DEVI, Use as directed to check blood sugars 4 times per day dx: e11.65, Disp: 1 each, Rfl: 3 .  Continuous Blood Gluc Sensor (FREESTYLE LIBRE 14 DAY SENSOR) MISC, Use as directed  to check blood sugars 4 times per day dx: e11.65, Disp: 2 each, Rfl: 3 .  diclofenac Sodium (VOLTAREN) 1 % GEL, Apply 2 g topically 4 (four) times daily. Apply to left knee., Disp: 50 g, Rfl: 0 .  insulin detemir (LEVEMIR) 100 UNIT/ML injection, Inject 0.12 mLs (12 Units total) into the skin 2 (two) times daily. (Patient taking differently: Inject 12 Units into the skin 2 (two) times daily. 3 UNITS IN THE AM AND 6 UNITS IN THE PM), Disp: 10 mL, Rfl: 11 .  Insulin Lispro (HUMALOG [place]), Inject  into the skin., Disp: , Rfl:  .  metoprolol tartrate (LOPRESSOR) 100 MG tablet, Take 1 tablet (100 mg total) by mouth 2 (two) times daily., Disp: , Rfl:  .  thiamine 100 MG tablet, Take 1 tablet (100 mg total) by mouth daily., Disp: , Rfl:  .  azithromycin (ZITHROMAX Z-PAK) 250 MG tablet, Take 2 tablets (500 mg) on  Day 1,  followed by 1 tablet (250 mg) once daily on Days 2 through 5., Disp: 6 each, Rfl: 0 .  DULoxetine (CYMBALTA) 60 MG capsule, Take 1 capsule (60 mg total) by mouth daily. (Patient not taking: Reported on 04/04/2020), Disp: 90 capsule, Rfl: 0 .  insulin aspart (NOVOLOG) 100 UNIT/ML injection, Inject 0-15 Units into the skin 3 (three) times daily with meals. Sliding scale insulin Less than 70 initiate hypoglycemia protocol 70-120  0 units 120-150 2 unit 151-200 3 units 201-250 5 units 251-300 8 units 301-350 11 units 351-400 15 units  Greater than 400 call MD (Patient not taking: Reported on 04/04/2020), Disp: 10 mL, Rfl: 11 .  insulin aspart (NOVOLOG) 100 UNIT/ML injection, Inject 4 Units into the skin 3 (three) times daily with meals. (Patient not taking: Reported on 04/04/2020), Disp: 10 mL, Rfl: 11 .  Insulin Pen Needle (BD PEN NEEDLE NANO U/F) 32G X 4 MM MISC, Use as directed with insulin pen, Disp: 150 each, Rfl: 2   Allergies  Allergen Reactions  . Cefepime Other (See Comments)    Causes encephalitis - reported by [facility] 12/31/2019  . Lactose Intolerance (Gi)     Per pt's daughter causes gas     Review of Systems  Constitutional: Negative.   Respiratory: Negative.   Cardiovascular: Positive for chest pain.  Gastrointestinal: Positive for abdominal pain.  Neurological: Negative.   Psychiatric/Behavioral: Negative.      Today's Vitals   04/04/20 1209  BP: 136/78  Pulse: 80  Temp: 98.1 F (36.7 C)  TempSrc: Oral   There is no height or weight on file to calculate BMI.   Objective:  Physical Exam Vitals and nursing note reviewed.  Constitutional:       Appearance: Normal appearance.  HENT:     Head: Normocephalic and atraumatic.  Cardiovascular:     Rate and Rhythm: Normal rate and regular rhythm.     Heart sounds: Normal heart sounds.  Pulmonary:     Effort: Pulmonary effort is normal.     Breath sounds: Normal breath sounds.  Abdominal:     General: Bowel sounds are normal.     Tenderness: There is abdominal tenderness.     Comments: She has generalized tenderness.   Musculoskeletal:     Comments: Right rib cage is tender to palpation  Skin:    General: Skin is warm.  Neurological:     General: No focal deficit present.     Mental Status: She is alert.  Psychiatric:  Mood and Affect: Mood normal.        Behavior: Behavior normal.         Assessment And Plan:     1. Atypical chest pain Comments: I think her sx are musculoskeletal in nature. A Salonpas patch was applied to the affected area. Daughter encouraged to let me know if this provides relief.  - CBC with Diff; Future  2. Generalized abdominal pain Comments: I plan to refer her for CT abd/pelvis. She was also given samples of Dexilant to take once daily for next 5-7 days to help w/ her sx. Her current sx may be exacerbated by gas.  - CT Abdomen Pelvis Wo Contrast; Future  3. Myalgia Comments: Importance of adequate hydration was discussed with the patient.   4. Diabetes mellitus with stage 4 chronic kidney disease ([place]) Comments: Chronic, I will check labs as listed below. I will adjust insulin as needed.  - CMP14+EGFR; Future - Hemoglobin A1c; Future  5. Hypertensive nephropathy Comments: Chronic, fair control. She will continue with current meds.      Patient was given opportunity to ask questions. Patient verbalized understanding of the plan and was able to repeat key elements of the plan. All questions were answered to their satisfaction.  [name], MD   I, [name], MD, have reviewed all documentation for this visit. The  documentation on 04/09/20 for the exam, diagnosis, procedures, and orders are all accurate and complete.  THE PATIENT IS ENCOURAGED TO PRACTICE SOCIAL DISTANCING DUE TO THE COVID-19 PANDEMIC.

## 2020-04-04 NOTE — ED Notes (Signed)
Pt eloped from waiting area. Called 3X.  

## 2020-04-04 NOTE — ED Triage Notes (Signed)
Per family at bedside, pt is in recovery for a encephalopathy. Family reports that yesterday she started having some pain in her right rib cage under her left breast. Pt was seen at her MD's office today and they believe it could be muscle spasms or an injury from pt constantly being lifted. Pt is referred here for a CT scan.

## 2020-04-05 ENCOUNTER — Telehealth: Payer: Self-pay

## 2020-04-05 ENCOUNTER — Encounter: Payer: Self-pay | Admitting: Internal Medicine

## 2020-04-05 DIAGNOSIS — E1122 Type 2 diabetes mellitus with diabetic chronic kidney disease: Secondary | ICD-10-CM | POA: Diagnosis not present

## 2020-04-05 DIAGNOSIS — E1151 Type 2 diabetes mellitus with diabetic peripheral angiopathy without gangrene: Secondary | ICD-10-CM | POA: Diagnosis not present

## 2020-04-05 DIAGNOSIS — I129 Hypertensive chronic kidney disease with stage 1 through stage 4 chronic kidney disease, or unspecified chronic kidney disease: Secondary | ICD-10-CM | POA: Diagnosis not present

## 2020-04-05 DIAGNOSIS — E440 Moderate protein-calorie malnutrition: Secondary | ICD-10-CM | POA: Diagnosis not present

## 2020-04-05 DIAGNOSIS — I2510 Atherosclerotic heart disease of native coronary artery without angina pectoris: Secondary | ICD-10-CM | POA: Diagnosis not present

## 2020-04-05 DIAGNOSIS — R1310 Dysphagia, unspecified: Secondary | ICD-10-CM | POA: Diagnosis not present

## 2020-04-05 DIAGNOSIS — N184 Chronic kidney disease, stage 4 (severe): Secondary | ICD-10-CM | POA: Diagnosis not present

## 2020-04-05 DIAGNOSIS — Z794 Long term (current) use of insulin: Secondary | ICD-10-CM | POA: Diagnosis not present

## 2020-04-05 DIAGNOSIS — G9341 Metabolic encephalopathy: Secondary | ICD-10-CM | POA: Diagnosis not present

## 2020-04-05 DIAGNOSIS — Z9884 Bariatric surgery status: Secondary | ICD-10-CM | POA: Diagnosis not present

## 2020-04-05 DIAGNOSIS — I272 Pulmonary hypertension, unspecified: Secondary | ICD-10-CM | POA: Diagnosis not present

## 2020-04-05 NOTE — Telephone Encounter (Signed)
Called pt daughter pt is still in a lot of pain they waited in the ER for over 5 hours before they left. Pt did get an x-ray done they are unsure of the results. Pt daughter is asking if you can put in an order for a CT scan maybe that will get her in the door faster than waiting in the ER. She is currently giving her mom tylenol, gas x, lidocaine patch, and dexilant.

## 2020-04-06 DIAGNOSIS — Z9884 Bariatric surgery status: Secondary | ICD-10-CM | POA: Diagnosis not present

## 2020-04-06 DIAGNOSIS — I2510 Atherosclerotic heart disease of native coronary artery without angina pectoris: Secondary | ICD-10-CM | POA: Diagnosis not present

## 2020-04-06 DIAGNOSIS — E440 Moderate protein-calorie malnutrition: Secondary | ICD-10-CM | POA: Diagnosis not present

## 2020-04-06 DIAGNOSIS — G9341 Metabolic encephalopathy: Secondary | ICD-10-CM | POA: Diagnosis not present

## 2020-04-06 DIAGNOSIS — R1310 Dysphagia, unspecified: Secondary | ICD-10-CM | POA: Diagnosis not present

## 2020-04-06 DIAGNOSIS — I272 Pulmonary hypertension, unspecified: Secondary | ICD-10-CM | POA: Diagnosis not present

## 2020-04-06 DIAGNOSIS — I129 Hypertensive chronic kidney disease with stage 1 through stage 4 chronic kidney disease, or unspecified chronic kidney disease: Secondary | ICD-10-CM | POA: Diagnosis not present

## 2020-04-06 DIAGNOSIS — E1151 Type 2 diabetes mellitus with diabetic peripheral angiopathy without gangrene: Secondary | ICD-10-CM | POA: Diagnosis not present

## 2020-04-06 DIAGNOSIS — Z794 Long term (current) use of insulin: Secondary | ICD-10-CM | POA: Diagnosis not present

## 2020-04-06 DIAGNOSIS — E1122 Type 2 diabetes mellitus with diabetic chronic kidney disease: Secondary | ICD-10-CM | POA: Diagnosis not present

## 2020-04-06 DIAGNOSIS — N184 Chronic kidney disease, stage 4 (severe): Secondary | ICD-10-CM | POA: Diagnosis not present

## 2020-04-09 ENCOUNTER — Other Ambulatory Visit: Payer: Medicare Other

## 2020-04-09 ENCOUNTER — Other Ambulatory Visit: Payer: Self-pay

## 2020-04-09 ENCOUNTER — Other Ambulatory Visit: Payer: Self-pay | Admitting: Internal Medicine

## 2020-04-09 ENCOUNTER — Ambulatory Visit (INDEPENDENT_AMBULATORY_CARE_PROVIDER_SITE_OTHER): Payer: Medicare Other

## 2020-04-09 ENCOUNTER — Ambulatory Visit
Admission: RE | Admit: 2020-04-09 | Discharge: 2020-04-09 | Disposition: A | Discharge status: Home / Self Care | Payer: Medicare Other | Source: Ambulatory Visit | Attending: Internal Medicine | Admitting: Internal Medicine

## 2020-04-09 VITALS — BP 118/62 | HR 82 | Temp 98.2°F

## 2020-04-09 DIAGNOSIS — N182 Chronic kidney disease, stage 2 (mild): Secondary | ICD-10-CM | POA: Diagnosis not present

## 2020-04-09 DIAGNOSIS — N184 Chronic kidney disease, stage 4 (severe): Secondary | ICD-10-CM

## 2020-04-09 DIAGNOSIS — E1129 Type 2 diabetes mellitus with other diabetic kidney complication: Secondary | ICD-10-CM | POA: Diagnosis not present

## 2020-04-09 DIAGNOSIS — E440 Moderate protein-calorie malnutrition: Secondary | ICD-10-CM | POA: Diagnosis not present

## 2020-04-09 DIAGNOSIS — K862 Cyst of pancreas: Secondary | ICD-10-CM | POA: Diagnosis not present

## 2020-04-09 DIAGNOSIS — K8020 Calculus of gallbladder without cholecystitis without obstruction: Secondary | ICD-10-CM | POA: Diagnosis not present

## 2020-04-09 DIAGNOSIS — R1084 Generalized abdominal pain: Secondary | ICD-10-CM

## 2020-04-09 DIAGNOSIS — I129 Hypertensive chronic kidney disease with stage 1 through stage 4 chronic kidney disease, or unspecified chronic kidney disease: Secondary | ICD-10-CM | POA: Diagnosis not present

## 2020-04-09 DIAGNOSIS — R1310 Dysphagia, unspecified: Secondary | ICD-10-CM | POA: Diagnosis not present

## 2020-04-09 DIAGNOSIS — N281 Cyst of kidney, acquired: Secondary | ICD-10-CM | POA: Diagnosis not present

## 2020-04-09 DIAGNOSIS — E1122 Type 2 diabetes mellitus with diabetic chronic kidney disease: Secondary | ICD-10-CM | POA: Diagnosis not present

## 2020-04-09 DIAGNOSIS — I272 Pulmonary hypertension, unspecified: Secondary | ICD-10-CM | POA: Diagnosis not present

## 2020-04-09 DIAGNOSIS — J189 Pneumonia, unspecified organism: Secondary | ICD-10-CM | POA: Diagnosis not present

## 2020-04-09 DIAGNOSIS — Z9884 Bariatric surgery status: Secondary | ICD-10-CM | POA: Diagnosis not present

## 2020-04-09 DIAGNOSIS — I2510 Atherosclerotic heart disease of native coronary artery without angina pectoris: Secondary | ICD-10-CM | POA: Diagnosis not present

## 2020-04-09 DIAGNOSIS — E1151 Type 2 diabetes mellitus with diabetic peripheral angiopathy without gangrene: Secondary | ICD-10-CM | POA: Diagnosis not present

## 2020-04-09 DIAGNOSIS — G9341 Metabolic encephalopathy: Secondary | ICD-10-CM | POA: Diagnosis not present

## 2020-04-09 DIAGNOSIS — Z794 Long term (current) use of insulin: Secondary | ICD-10-CM | POA: Diagnosis not present

## 2020-04-09 DIAGNOSIS — R0789 Other chest pain: Secondary | ICD-10-CM

## 2020-04-09 DIAGNOSIS — N3289 Other specified disorders of bladder: Secondary | ICD-10-CM | POA: Diagnosis not present

## 2020-04-09 LAB — CMP14+EGFR
ALT: 10 IU/L (ref 0–32)
AST: 12 IU/L (ref 0–40)
Albumin/Globulin Ratio: 1 — ABNORMAL LOW (ref 1.2–2.2)
Albumin: 3.2 g/dL — ABNORMAL LOW (ref 3.7–4.7)
Alkaline Phosphatase: 81 IU/L (ref 44–121)
BUN/Creatinine Ratio: 17 (ref 12–28)
BUN: 39 mg/dL — ABNORMAL HIGH (ref 8–27)
Bilirubin Total: 0.3 mg/dL (ref 0.0–1.2)
CO2: 16 mmol/L — ABNORMAL LOW (ref 20–29)
Calcium: 9.3 mg/dL (ref 8.7–10.3)
Chloride: 108 mmol/L — ABNORMAL HIGH (ref 96–106)
Creatinine, Ser: 2.3 mg/dL — ABNORMAL HIGH (ref 0.57–1.00)
GFR calc Af Amer: 24 mL/min/{1.73_m2} — ABNORMAL LOW (ref >59)
GFR calc non Af Amer: 21 mL/min/{1.73_m2} — ABNORMAL LOW (ref >59)
Globulin, Total: 3.1 g/dL (ref 1.5–4.5)
Glucose: 126 mg/dL — ABNORMAL HIGH (ref 65–99)
Potassium: 4 mmol/L (ref 3.5–5.2)
Sodium: 141 mmol/L (ref 134–144)
Total Protein: 6.3 g/dL (ref 6.0–8.5)

## 2020-04-09 LAB — CBC WITH DIFFERENTIAL/PLATELET
Basophils Absolute: 0 10*3/uL (ref 0.0–0.2)
Basos: 0 %
EOS (ABSOLUTE): 0.1 10*3/uL (ref 0.0–0.4)
Eos: 1 %
Hematocrit: 28.7 % — ABNORMAL LOW (ref 34.0–46.6)
Hemoglobin: 8.9 g/dL — ABNORMAL LOW (ref 11.1–15.9)
Immature Grans (Abs): 0.1 10*3/uL (ref 0.0–0.1)
Immature Granulocytes: 1 %
Lymphocytes Absolute: 1.9 10*3/uL (ref 0.7–3.1)
Lymphs: 17 %
MCH: 23.9 pg — ABNORMAL LOW (ref 26.6–33.0)
MCHC: 31 g/dL — ABNORMAL LOW (ref 31.5–35.7)
MCV: 77 fL — ABNORMAL LOW (ref 79–97)
Monocytes Absolute: 1 10*3/uL — ABNORMAL HIGH (ref 0.1–0.9)
Monocytes: 9 %
Neutrophils Absolute: 8.1 10*3/uL — ABNORMAL HIGH (ref 1.4–7.0)
Neutrophils: 72 %
Platelets: 278 10*3/uL (ref 150–450)
RBC: 3.72 x10E6/uL — ABNORMAL LOW (ref 3.77–5.28)
RDW: 15.2 % (ref 11.7–15.4)
WBC: 11.2 10*3/uL — ABNORMAL HIGH (ref 3.4–10.8)

## 2020-04-09 LAB — HEMOGLOBIN A1C
Est. average glucose Bld gHb Est-mCnc: 103 mg/dL
Hgb A1c MFr Bld: 5.2 % (ref 4.8–5.6)

## 2020-04-09 MED ORDER — CEFTRIAXONE SODIUM 1 G IJ SOLR
1.0000 g | Freq: Once | INTRAMUSCULAR | Status: AC
  Administered 2020-04-09: 1 g via INTRAMUSCULAR

## 2020-04-09 MED ORDER — BD PEN NEEDLE NANO U/F 32G X 4 MM MISC: 2 refills | Status: DC

## 2020-04-09 MED ORDER — AZITHROMYCIN 250 MG PO TABS: ORAL_TABLET | ORAL | 0 refills | Status: AC

## 2020-04-09 NOTE — Progress Notes (Signed)
Patient is here today for rocephin inject for pneumonia.

## 2020-04-10 ENCOUNTER — Telehealth: Payer: Self-pay

## 2020-04-10 ENCOUNTER — Telehealth: Payer: Medicare Other

## 2020-04-10 DIAGNOSIS — I272 Pulmonary hypertension, unspecified: Secondary | ICD-10-CM | POA: Diagnosis not present

## 2020-04-10 DIAGNOSIS — E440 Moderate protein-calorie malnutrition: Secondary | ICD-10-CM | POA: Diagnosis not present

## 2020-04-10 DIAGNOSIS — I129 Hypertensive chronic kidney disease with stage 1 through stage 4 chronic kidney disease, or unspecified chronic kidney disease: Secondary | ICD-10-CM | POA: Diagnosis not present

## 2020-04-10 DIAGNOSIS — R1310 Dysphagia, unspecified: Secondary | ICD-10-CM | POA: Diagnosis not present

## 2020-04-10 DIAGNOSIS — Z794 Long term (current) use of insulin: Secondary | ICD-10-CM | POA: Diagnosis not present

## 2020-04-10 DIAGNOSIS — N184 Chronic kidney disease, stage 4 (severe): Secondary | ICD-10-CM | POA: Diagnosis not present

## 2020-04-10 DIAGNOSIS — E1151 Type 2 diabetes mellitus with diabetic peripheral angiopathy without gangrene: Secondary | ICD-10-CM | POA: Diagnosis not present

## 2020-04-10 DIAGNOSIS — I2510 Atherosclerotic heart disease of native coronary artery without angina pectoris: Secondary | ICD-10-CM | POA: Diagnosis not present

## 2020-04-10 DIAGNOSIS — E1122 Type 2 diabetes mellitus with diabetic chronic kidney disease: Secondary | ICD-10-CM | POA: Diagnosis not present

## 2020-04-10 DIAGNOSIS — G9341 Metabolic encephalopathy: Secondary | ICD-10-CM | POA: Diagnosis not present

## 2020-04-10 DIAGNOSIS — Z9884 Bariatric surgery status: Secondary | ICD-10-CM | POA: Diagnosis not present

## 2020-04-10 NOTE — Telephone Encounter (Signed)
  Chronic Care Management   Outreach Note  04/10/2020 Name: Miranda Roman MRN: [id number] DOB: [birth year]  Referred by: Glendale Chard, MD Reason for referral : Care Coordination   Second unsuccessful outbound call placed to the patients daughter Tedra Coupe to assist with care coordination needs. SW unable to leave a HIPAA compliant voice message to request a return call as voice mailbox continues to be full.  Follow Up Plan: The care management team will reach out to the patient again over the next 21 days.   Daneen Schick, BSW, CDP Social Worker, Certified Dementia Practitioner [place] [facility] [phone]

## 2020-04-11 DIAGNOSIS — Z794 Long term (current) use of insulin: Secondary | ICD-10-CM | POA: Diagnosis not present

## 2020-04-11 DIAGNOSIS — N184 Chronic kidney disease, stage 4 (severe): Secondary | ICD-10-CM | POA: Diagnosis not present

## 2020-04-11 DIAGNOSIS — E1122 Type 2 diabetes mellitus with diabetic chronic kidney disease: Secondary | ICD-10-CM | POA: Diagnosis not present

## 2020-04-11 DIAGNOSIS — I129 Hypertensive chronic kidney disease with stage 1 through stage 4 chronic kidney disease, or unspecified chronic kidney disease: Secondary | ICD-10-CM | POA: Diagnosis not present

## 2020-04-11 DIAGNOSIS — I2510 Atherosclerotic heart disease of native coronary artery without angina pectoris: Secondary | ICD-10-CM | POA: Diagnosis not present

## 2020-04-11 DIAGNOSIS — E1151 Type 2 diabetes mellitus with diabetic peripheral angiopathy without gangrene: Secondary | ICD-10-CM | POA: Diagnosis not present

## 2020-04-11 DIAGNOSIS — I272 Pulmonary hypertension, unspecified: Secondary | ICD-10-CM | POA: Diagnosis not present

## 2020-04-11 DIAGNOSIS — E440 Moderate protein-calorie malnutrition: Secondary | ICD-10-CM | POA: Diagnosis not present

## 2020-04-11 DIAGNOSIS — G9341 Metabolic encephalopathy: Secondary | ICD-10-CM | POA: Diagnosis not present

## 2020-04-11 DIAGNOSIS — Z9884 Bariatric surgery status: Secondary | ICD-10-CM | POA: Diagnosis not present

## 2020-04-11 DIAGNOSIS — R1310 Dysphagia, unspecified: Secondary | ICD-10-CM | POA: Diagnosis not present

## 2020-04-12 DIAGNOSIS — I10 Essential (primary) hypertension: Secondary | ICD-10-CM | POA: Diagnosis not present

## 2020-04-12 DIAGNOSIS — N184 Chronic kidney disease, stage 4 (severe): Secondary | ICD-10-CM | POA: Diagnosis not present

## 2020-04-12 DIAGNOSIS — Z794 Long term (current) use of insulin: Secondary | ICD-10-CM | POA: Diagnosis not present

## 2020-04-12 DIAGNOSIS — E119 Type 2 diabetes mellitus without complications: Secondary | ICD-10-CM | POA: Diagnosis not present

## 2020-04-13 DIAGNOSIS — I272 Pulmonary hypertension, unspecified: Secondary | ICD-10-CM | POA: Diagnosis not present

## 2020-04-13 DIAGNOSIS — I2510 Atherosclerotic heart disease of native coronary artery without angina pectoris: Secondary | ICD-10-CM | POA: Diagnosis not present

## 2020-04-13 DIAGNOSIS — Z9884 Bariatric surgery status: Secondary | ICD-10-CM | POA: Diagnosis not present

## 2020-04-13 DIAGNOSIS — E1151 Type 2 diabetes mellitus with diabetic peripheral angiopathy without gangrene: Secondary | ICD-10-CM | POA: Diagnosis not present

## 2020-04-13 DIAGNOSIS — Z794 Long term (current) use of insulin: Secondary | ICD-10-CM | POA: Diagnosis not present

## 2020-04-13 DIAGNOSIS — E1122 Type 2 diabetes mellitus with diabetic chronic kidney disease: Secondary | ICD-10-CM | POA: Diagnosis not present

## 2020-04-13 DIAGNOSIS — N184 Chronic kidney disease, stage 4 (severe): Secondary | ICD-10-CM | POA: Diagnosis not present

## 2020-04-13 DIAGNOSIS — I129 Hypertensive chronic kidney disease with stage 1 through stage 4 chronic kidney disease, or unspecified chronic kidney disease: Secondary | ICD-10-CM | POA: Diagnosis not present

## 2020-04-13 DIAGNOSIS — R1310 Dysphagia, unspecified: Secondary | ICD-10-CM | POA: Diagnosis not present

## 2020-04-13 DIAGNOSIS — G9341 Metabolic encephalopathy: Secondary | ICD-10-CM | POA: Diagnosis not present

## 2020-04-13 DIAGNOSIS — E440 Moderate protein-calorie malnutrition: Secondary | ICD-10-CM | POA: Diagnosis not present

## 2020-04-16 DIAGNOSIS — E1122 Type 2 diabetes mellitus with diabetic chronic kidney disease: Secondary | ICD-10-CM | POA: Diagnosis not present

## 2020-04-16 DIAGNOSIS — R1310 Dysphagia, unspecified: Secondary | ICD-10-CM | POA: Diagnosis not present

## 2020-04-16 DIAGNOSIS — E1151 Type 2 diabetes mellitus with diabetic peripheral angiopathy without gangrene: Secondary | ICD-10-CM | POA: Diagnosis not present

## 2020-04-16 DIAGNOSIS — E440 Moderate protein-calorie malnutrition: Secondary | ICD-10-CM | POA: Diagnosis not present

## 2020-04-16 DIAGNOSIS — I2510 Atherosclerotic heart disease of native coronary artery without angina pectoris: Secondary | ICD-10-CM | POA: Diagnosis not present

## 2020-04-16 DIAGNOSIS — I129 Hypertensive chronic kidney disease with stage 1 through stage 4 chronic kidney disease, or unspecified chronic kidney disease: Secondary | ICD-10-CM | POA: Diagnosis not present

## 2020-04-16 DIAGNOSIS — N184 Chronic kidney disease, stage 4 (severe): Secondary | ICD-10-CM | POA: Diagnosis not present

## 2020-04-16 DIAGNOSIS — Z794 Long term (current) use of insulin: Secondary | ICD-10-CM | POA: Diagnosis not present

## 2020-04-16 DIAGNOSIS — Z9884 Bariatric surgery status: Secondary | ICD-10-CM | POA: Diagnosis not present

## 2020-04-16 DIAGNOSIS — G9341 Metabolic encephalopathy: Secondary | ICD-10-CM | POA: Diagnosis not present

## 2020-04-16 DIAGNOSIS — I272 Pulmonary hypertension, unspecified: Secondary | ICD-10-CM | POA: Diagnosis not present

## 2020-04-17 ENCOUNTER — Telehealth: Payer: Medicare Other

## 2020-04-17 ENCOUNTER — Other Ambulatory Visit: Payer: Self-pay | Admitting: Internal Medicine

## 2020-04-17 DIAGNOSIS — E1122 Type 2 diabetes mellitus with diabetic chronic kidney disease: Secondary | ICD-10-CM | POA: Diagnosis not present

## 2020-04-17 DIAGNOSIS — R1310 Dysphagia, unspecified: Secondary | ICD-10-CM | POA: Diagnosis not present

## 2020-04-17 DIAGNOSIS — E1151 Type 2 diabetes mellitus with diabetic peripheral angiopathy without gangrene: Secondary | ICD-10-CM | POA: Diagnosis not present

## 2020-04-17 DIAGNOSIS — Z794 Long term (current) use of insulin: Secondary | ICD-10-CM | POA: Diagnosis not present

## 2020-04-17 DIAGNOSIS — G9341 Metabolic encephalopathy: Secondary | ICD-10-CM | POA: Diagnosis not present

## 2020-04-17 DIAGNOSIS — E440 Moderate protein-calorie malnutrition: Secondary | ICD-10-CM | POA: Diagnosis not present

## 2020-04-17 DIAGNOSIS — N184 Chronic kidney disease, stage 4 (severe): Secondary | ICD-10-CM | POA: Diagnosis not present

## 2020-04-17 DIAGNOSIS — I272 Pulmonary hypertension, unspecified: Secondary | ICD-10-CM | POA: Diagnosis not present

## 2020-04-17 DIAGNOSIS — I2510 Atherosclerotic heart disease of native coronary artery without angina pectoris: Secondary | ICD-10-CM | POA: Diagnosis not present

## 2020-04-17 DIAGNOSIS — I129 Hypertensive chronic kidney disease with stage 1 through stage 4 chronic kidney disease, or unspecified chronic kidney disease: Secondary | ICD-10-CM | POA: Diagnosis not present

## 2020-04-17 DIAGNOSIS — Z9884 Bariatric surgery status: Secondary | ICD-10-CM | POA: Diagnosis not present

## 2020-04-17 MED ORDER — GABAPENTIN 100 MG PO CAPS: ORAL_CAPSULE | ORAL | 2 refills | Status: DC

## 2020-04-18 ENCOUNTER — Telehealth: Payer: Self-pay

## 2020-04-18 DIAGNOSIS — N184 Chronic kidney disease, stage 4 (severe): Secondary | ICD-10-CM | POA: Diagnosis not present

## 2020-04-18 DIAGNOSIS — E1151 Type 2 diabetes mellitus with diabetic peripheral angiopathy without gangrene: Secondary | ICD-10-CM | POA: Diagnosis not present

## 2020-04-18 DIAGNOSIS — E440 Moderate protein-calorie malnutrition: Secondary | ICD-10-CM | POA: Diagnosis not present

## 2020-04-18 DIAGNOSIS — I129 Hypertensive chronic kidney disease with stage 1 through stage 4 chronic kidney disease, or unspecified chronic kidney disease: Secondary | ICD-10-CM | POA: Diagnosis not present

## 2020-04-18 DIAGNOSIS — I2510 Atherosclerotic heart disease of native coronary artery without angina pectoris: Secondary | ICD-10-CM | POA: Diagnosis not present

## 2020-04-18 DIAGNOSIS — G9341 Metabolic encephalopathy: Secondary | ICD-10-CM | POA: Diagnosis not present

## 2020-04-18 DIAGNOSIS — E1122 Type 2 diabetes mellitus with diabetic chronic kidney disease: Secondary | ICD-10-CM | POA: Diagnosis not present

## 2020-04-18 DIAGNOSIS — I272 Pulmonary hypertension, unspecified: Secondary | ICD-10-CM | POA: Diagnosis not present

## 2020-04-18 DIAGNOSIS — Z9884 Bariatric surgery status: Secondary | ICD-10-CM | POA: Diagnosis not present

## 2020-04-18 DIAGNOSIS — R1310 Dysphagia, unspecified: Secondary | ICD-10-CM | POA: Diagnosis not present

## 2020-04-18 DIAGNOSIS — Z794 Long term (current) use of insulin: Secondary | ICD-10-CM | POA: Diagnosis not present

## 2020-04-18 NOTE — Telephone Encounter (Signed)
-----  Message from Glendale Chard, MD sent at 04/17/2020  9:34 PM EDT ----- Rx has been sent.   Please let them know this is usually handled by kidney specialist? Be sure they have upcoming appt w/ Renal.   RS ----- Message ----- From: Michelle Nasuti, [place] Sent: 04/17/2020   3:01 PM EDT To: Glendale Chard, MD  Rx for gabapentin needs to be sent to the pharmacy and the pt's family wants to know if the pt is to continue with procrit medication.    Gilmore Laroche [phone]

## 2020-04-19 DIAGNOSIS — E1122 Type 2 diabetes mellitus with diabetic chronic kidney disease: Secondary | ICD-10-CM | POA: Diagnosis not present

## 2020-04-19 DIAGNOSIS — N184 Chronic kidney disease, stage 4 (severe): Secondary | ICD-10-CM | POA: Diagnosis not present

## 2020-04-19 DIAGNOSIS — D696 Thrombocytopenia, unspecified: Secondary | ICD-10-CM | POA: Diagnosis not present

## 2020-04-19 DIAGNOSIS — E440 Moderate protein-calorie malnutrition: Secondary | ICD-10-CM | POA: Diagnosis not present

## 2020-04-19 DIAGNOSIS — Z794 Long term (current) use of insulin: Secondary | ICD-10-CM | POA: Diagnosis not present

## 2020-04-19 DIAGNOSIS — E119 Type 2 diabetes mellitus without complications: Secondary | ICD-10-CM | POA: Diagnosis not present

## 2020-04-19 DIAGNOSIS — I272 Pulmonary hypertension, unspecified: Secondary | ICD-10-CM | POA: Diagnosis not present

## 2020-04-19 DIAGNOSIS — I129 Hypertensive chronic kidney disease with stage 1 through stage 4 chronic kidney disease, or unspecified chronic kidney disease: Secondary | ICD-10-CM | POA: Diagnosis not present

## 2020-04-19 DIAGNOSIS — E1151 Type 2 diabetes mellitus with diabetic peripheral angiopathy without gangrene: Secondary | ICD-10-CM | POA: Diagnosis not present

## 2020-04-19 DIAGNOSIS — Z9884 Bariatric surgery status: Secondary | ICD-10-CM | POA: Diagnosis not present

## 2020-04-19 DIAGNOSIS — R1310 Dysphagia, unspecified: Secondary | ICD-10-CM | POA: Diagnosis not present

## 2020-04-19 DIAGNOSIS — I2510 Atherosclerotic heart disease of native coronary artery without angina pectoris: Secondary | ICD-10-CM | POA: Diagnosis not present

## 2020-04-19 DIAGNOSIS — G9341 Metabolic encephalopathy: Secondary | ICD-10-CM | POA: Diagnosis not present

## 2020-04-20 DIAGNOSIS — I129 Hypertensive chronic kidney disease with stage 1 through stage 4 chronic kidney disease, or unspecified chronic kidney disease: Secondary | ICD-10-CM | POA: Diagnosis not present

## 2020-04-20 DIAGNOSIS — I272 Pulmonary hypertension, unspecified: Secondary | ICD-10-CM | POA: Diagnosis not present

## 2020-04-20 DIAGNOSIS — R1310 Dysphagia, unspecified: Secondary | ICD-10-CM | POA: Diagnosis not present

## 2020-04-20 DIAGNOSIS — G9341 Metabolic encephalopathy: Secondary | ICD-10-CM | POA: Diagnosis not present

## 2020-04-20 DIAGNOSIS — Z9884 Bariatric surgery status: Secondary | ICD-10-CM | POA: Diagnosis not present

## 2020-04-20 DIAGNOSIS — Z794 Long term (current) use of insulin: Secondary | ICD-10-CM | POA: Diagnosis not present

## 2020-04-20 DIAGNOSIS — E1151 Type 2 diabetes mellitus with diabetic peripheral angiopathy without gangrene: Secondary | ICD-10-CM | POA: Diagnosis not present

## 2020-04-20 DIAGNOSIS — N184 Chronic kidney disease, stage 4 (severe): Secondary | ICD-10-CM | POA: Diagnosis not present

## 2020-04-20 DIAGNOSIS — E1122 Type 2 diabetes mellitus with diabetic chronic kidney disease: Secondary | ICD-10-CM | POA: Diagnosis not present

## 2020-04-20 DIAGNOSIS — I2510 Atherosclerotic heart disease of native coronary artery without angina pectoris: Secondary | ICD-10-CM | POA: Diagnosis not present

## 2020-04-20 DIAGNOSIS — E440 Moderate protein-calorie malnutrition: Secondary | ICD-10-CM | POA: Diagnosis not present

## 2020-04-21 ENCOUNTER — Other Ambulatory Visit: Payer: Self-pay

## 2020-04-21 MED ORDER — METOPROLOL TARTRATE 100 MG PO TABS
100.0000 mg | ORAL_TABLET | Freq: Two times a day (BID) | ORAL | 1 refills | Status: DC
Start: 2020-04-21 — End: 2020-04-24

## 2020-04-23 ENCOUNTER — Other Ambulatory Visit: Payer: Self-pay | Admitting: Internal Medicine

## 2020-04-23 ENCOUNTER — Telehealth: Payer: Self-pay

## 2020-04-23 DIAGNOSIS — R1310 Dysphagia, unspecified: Secondary | ICD-10-CM | POA: Diagnosis not present

## 2020-04-23 DIAGNOSIS — N184 Chronic kidney disease, stage 4 (severe): Secondary | ICD-10-CM | POA: Diagnosis not present

## 2020-04-23 DIAGNOSIS — H538 Other visual disturbances: Secondary | ICD-10-CM

## 2020-04-23 DIAGNOSIS — I272 Pulmonary hypertension, unspecified: Secondary | ICD-10-CM | POA: Diagnosis not present

## 2020-04-23 DIAGNOSIS — Z9884 Bariatric surgery status: Secondary | ICD-10-CM | POA: Diagnosis not present

## 2020-04-23 DIAGNOSIS — J984 Other disorders of lung: Secondary | ICD-10-CM | POA: Diagnosis not present

## 2020-04-23 DIAGNOSIS — E1122 Type 2 diabetes mellitus with diabetic chronic kidney disease: Secondary | ICD-10-CM | POA: Diagnosis not present

## 2020-04-23 DIAGNOSIS — G9340 Encephalopathy, unspecified: Secondary | ICD-10-CM

## 2020-04-23 DIAGNOSIS — I129 Hypertensive chronic kidney disease with stage 1 through stage 4 chronic kidney disease, or unspecified chronic kidney disease: Secondary | ICD-10-CM | POA: Diagnosis not present

## 2020-04-23 DIAGNOSIS — Z794 Long term (current) use of insulin: Secondary | ICD-10-CM | POA: Diagnosis not present

## 2020-04-23 DIAGNOSIS — E440 Moderate protein-calorie malnutrition: Secondary | ICD-10-CM | POA: Diagnosis not present

## 2020-04-23 DIAGNOSIS — I2510 Atherosclerotic heart disease of native coronary artery without angina pectoris: Secondary | ICD-10-CM | POA: Diagnosis not present

## 2020-04-23 DIAGNOSIS — E1151 Type 2 diabetes mellitus with diabetic peripheral angiopathy without gangrene: Secondary | ICD-10-CM | POA: Diagnosis not present

## 2020-04-23 DIAGNOSIS — G9341 Metabolic encephalopathy: Secondary | ICD-10-CM | POA: Diagnosis not present

## 2020-04-23 NOTE — Telephone Encounter (Signed)
Spoke with pt daughter referral has been placed for neuro. Daughter advised to take mother to [place] for hand and shoulder pain. thn remote place referral for chest xray.

## 2020-04-24 ENCOUNTER — Other Ambulatory Visit: Payer: Self-pay

## 2020-04-24 DIAGNOSIS — Z9884 Bariatric surgery status: Secondary | ICD-10-CM | POA: Diagnosis not present

## 2020-04-24 DIAGNOSIS — I272 Pulmonary hypertension, unspecified: Secondary | ICD-10-CM | POA: Diagnosis not present

## 2020-04-24 DIAGNOSIS — E1151 Type 2 diabetes mellitus with diabetic peripheral angiopathy without gangrene: Secondary | ICD-10-CM | POA: Diagnosis not present

## 2020-04-24 DIAGNOSIS — I2510 Atherosclerotic heart disease of native coronary artery without angina pectoris: Secondary | ICD-10-CM | POA: Diagnosis not present

## 2020-04-24 DIAGNOSIS — I129 Hypertensive chronic kidney disease with stage 1 through stage 4 chronic kidney disease, or unspecified chronic kidney disease: Secondary | ICD-10-CM | POA: Diagnosis not present

## 2020-04-24 DIAGNOSIS — E440 Moderate protein-calorie malnutrition: Secondary | ICD-10-CM | POA: Diagnosis not present

## 2020-04-24 DIAGNOSIS — Z794 Long term (current) use of insulin: Secondary | ICD-10-CM | POA: Diagnosis not present

## 2020-04-24 DIAGNOSIS — G9341 Metabolic encephalopathy: Secondary | ICD-10-CM | POA: Diagnosis not present

## 2020-04-24 DIAGNOSIS — R1310 Dysphagia, unspecified: Secondary | ICD-10-CM | POA: Diagnosis not present

## 2020-04-24 DIAGNOSIS — N184 Chronic kidney disease, stage 4 (severe): Secondary | ICD-10-CM | POA: Diagnosis not present

## 2020-04-24 DIAGNOSIS — E1122 Type 2 diabetes mellitus with diabetic chronic kidney disease: Secondary | ICD-10-CM | POA: Diagnosis not present

## 2020-04-24 MED ORDER — METOPROLOL TARTRATE 100 MG PO TABS
100.0000 mg | ORAL_TABLET | Freq: Two times a day (BID) | ORAL | 1 refills | Status: DC
Start: 2020-04-24 — End: 2020-08-01

## 2020-04-25 ENCOUNTER — Other Ambulatory Visit: Payer: Self-pay

## 2020-04-25 ENCOUNTER — Encounter (HOSPITAL_COMMUNITY): Payer: Self-pay | Admitting: Emergency Medicine

## 2020-04-25 ENCOUNTER — Emergency Department (HOSPITAL_COMMUNITY): Payer: Medicare Other

## 2020-04-25 ENCOUNTER — Emergency Department (HOSPITAL_COMMUNITY)
Admission: EM | Admit: 2020-04-25 | Discharge: 2020-04-26 | Disposition: A | Discharge status: Home / Self Care | Payer: Medicare Other | Attending: Emergency Medicine | Admitting: Emergency Medicine

## 2020-04-25 DIAGNOSIS — R7989 Other specified abnormal findings of blood chemistry: Secondary | ICD-10-CM | POA: Diagnosis not present

## 2020-04-25 DIAGNOSIS — Z87891 Personal history of nicotine dependence: Secondary | ICD-10-CM | POA: Diagnosis not present

## 2020-04-25 DIAGNOSIS — N184 Chronic kidney disease, stage 4 (severe): Secondary | ICD-10-CM | POA: Diagnosis not present

## 2020-04-25 DIAGNOSIS — J189 Pneumonia, unspecified organism: Secondary | ICD-10-CM | POA: Insufficient documentation

## 2020-04-25 DIAGNOSIS — G9341 Metabolic encephalopathy: Secondary | ICD-10-CM | POA: Diagnosis not present

## 2020-04-25 DIAGNOSIS — E440 Moderate protein-calorie malnutrition: Secondary | ICD-10-CM | POA: Diagnosis not present

## 2020-04-25 DIAGNOSIS — N644 Mastodynia: Secondary | ICD-10-CM | POA: Diagnosis not present

## 2020-04-25 DIAGNOSIS — I10 Essential (primary) hypertension: Secondary | ICD-10-CM | POA: Diagnosis not present

## 2020-04-25 DIAGNOSIS — E1122 Type 2 diabetes mellitus with diabetic chronic kidney disease: Secondary | ICD-10-CM | POA: Insufficient documentation

## 2020-04-25 DIAGNOSIS — Z79899 Other long term (current) drug therapy: Secondary | ICD-10-CM | POA: Insufficient documentation

## 2020-04-25 DIAGNOSIS — J90 Pleural effusion, not elsewhere classified: Secondary | ICD-10-CM | POA: Diagnosis not present

## 2020-04-25 DIAGNOSIS — Z794 Long term (current) use of insulin: Secondary | ICD-10-CM | POA: Insufficient documentation

## 2020-04-25 DIAGNOSIS — E1151 Type 2 diabetes mellitus with diabetic peripheral angiopathy without gangrene: Secondary | ICD-10-CM | POA: Diagnosis not present

## 2020-04-25 DIAGNOSIS — I129 Hypertensive chronic kidney disease with stage 1 through stage 4 chronic kidney disease, or unspecified chronic kidney disease: Secondary | ICD-10-CM | POA: Insufficient documentation

## 2020-04-25 DIAGNOSIS — I2699 Other pulmonary embolism without acute cor pulmonale: Secondary | ICD-10-CM | POA: Diagnosis not present

## 2020-04-25 DIAGNOSIS — R0789 Other chest pain: Secondary | ICD-10-CM | POA: Diagnosis present

## 2020-04-25 DIAGNOSIS — I2510 Atherosclerotic heart disease of native coronary artery without angina pectoris: Secondary | ICD-10-CM | POA: Diagnosis not present

## 2020-04-25 DIAGNOSIS — I739 Peripheral vascular disease, unspecified: Secondary | ICD-10-CM | POA: Diagnosis not present

## 2020-04-25 DIAGNOSIS — R1310 Dysphagia, unspecified: Secondary | ICD-10-CM | POA: Diagnosis not present

## 2020-04-25 DIAGNOSIS — Z8709 Personal history of other diseases of the respiratory system: Secondary | ICD-10-CM | POA: Diagnosis not present

## 2020-04-25 DIAGNOSIS — Z9884 Bariatric surgery status: Secondary | ICD-10-CM | POA: Diagnosis not present

## 2020-04-25 DIAGNOSIS — I272 Pulmonary hypertension, unspecified: Secondary | ICD-10-CM | POA: Diagnosis not present

## 2020-04-25 DIAGNOSIS — I351 Nonrheumatic aortic (valve) insufficiency: Secondary | ICD-10-CM | POA: Diagnosis not present

## 2020-04-25 DIAGNOSIS — R079 Chest pain, unspecified: Secondary | ICD-10-CM | POA: Diagnosis not present

## 2020-04-25 HISTORY — DX: Beta thalassemia: D56.1

## 2020-04-25 HISTORY — DX: Sickle-cell trait: D57.3

## 2020-04-25 LAB — CBC WITH DIFFERENTIAL/PLATELET
Abs Immature Granulocytes: 0.02 10*3/uL (ref 0.00–0.07)
Basophils Absolute: 0 10*3/uL (ref 0.0–0.1)
Basophils Relative: 0 %
Eosinophils Absolute: 0.2 10*3/uL (ref 0.0–0.5)
Eosinophils Relative: 2 %
HCT: 30.4 % — ABNORMAL LOW (ref 36.0–46.0)
Hemoglobin: 9.4 g/dL — ABNORMAL LOW (ref 12.0–15.0)
Immature Granulocytes: 0 %
Lymphocytes Relative: 34 %
Lymphs Abs: 2.7 10*3/uL (ref 0.7–4.0)
MCH: 23.6 pg — ABNORMAL LOW (ref 26.0–34.0)
MCHC: 30.9 g/dL (ref 30.0–36.0)
MCV: 76.2 fL — ABNORMAL LOW (ref 80.0–100.0)
Monocytes Absolute: 0.5 10*3/uL (ref 0.1–1.0)
Monocytes Relative: 7 %
Neutro Abs: 4.4 10*3/uL (ref 1.7–7.7)
Neutrophils Relative %: 57 %
Platelets: 305 10*3/uL (ref 150–400)
RBC: 3.99 MIL/uL (ref 3.87–5.11)
RDW: 15.2 % (ref 11.5–15.5)
WBC: 7.8 10*3/uL (ref 4.0–10.5)
nRBC: 0 % (ref 0.0–0.2)

## 2020-04-25 LAB — COMPREHENSIVE METABOLIC PANEL
ALT: 9 U/L (ref 0–44)
AST: 13 U/L — ABNORMAL LOW (ref 15–41)
Albumin: 3 g/dL — ABNORMAL LOW (ref 3.5–5.0)
Alkaline Phosphatase: 60 U/L (ref 38–126)
Anion gap: 10 (ref 5–15)
BUN: 24 mg/dL — ABNORMAL HIGH (ref 8–23)
CO2: 21 mmol/L — ABNORMAL LOW (ref 22–32)
Calcium: 8.7 mg/dL — ABNORMAL LOW (ref 8.9–10.3)
Chloride: 106 mmol/L (ref 98–111)
Creatinine, Ser: 1.91 mg/dL — ABNORMAL HIGH (ref 0.44–1.00)
GFR, Estimated: 26 mL/min — ABNORMAL LOW (ref >60)
Glucose, Bld: 123 mg/dL — ABNORMAL HIGH (ref 70–99)
Potassium: 3.7 mmol/L (ref 3.5–5.1)
Sodium: 137 mmol/L (ref 135–145)
Total Bilirubin: 0.4 mg/dL (ref 0.3–1.2)
Total Protein: 6.4 g/dL — ABNORMAL LOW (ref 6.5–8.1)

## 2020-04-25 LAB — CBG MONITORING, ED
Glucose-Capillary: 68 mg/dL — ABNORMAL LOW (ref 70–99)
Glucose-Capillary: 92 mg/dL (ref 70–99)

## 2020-04-25 LAB — TROPONIN I (HIGH SENSITIVITY): Troponin I (High Sensitivity): 7 ng/L (ref <18)

## 2020-04-25 MED ORDER — TECHNETIUM TO 99M ALBUMIN AGGREGATED
4.1500 | Freq: Once | INTRAVENOUS | Status: AC | PRN
  Administered 2020-04-25: 4.15 via INTRAVENOUS

## 2020-04-25 NOTE — ED Notes (Signed)
Pt states she feels her blood sugar is low. CBG obtained, pt is 68. [place], PA made aware. After talking with Dr. Vanita Panda, was told to give pt Orange Juice.

## 2020-04-25 NOTE — ED Notes (Signed)
Pt urinated in the bed on accident. Pt cleaned, linens changed, new brief placed on pt.   Pt transported to [facility].

## 2020-04-25 NOTE — ED Notes (Signed)
Attempted IV, unsuccessful. Per Daughter at bedside, pt is usually [place] IV. IV team consult placed.

## 2020-04-25 NOTE — ED Provider Notes (Signed)
[place] DEPT Provider Note   CSN: [id number] Arrival date & time: 04/25/20  1657     History Chief Complaint  Patient presents with  . Pulmonary embolism    Miranda Roman is a 74 y.o. female.  HPI   74 year old female history of anemia, diabetes, lipidemia, hypertension, osteoporosis, sickle cell trait, beta thalassemia, who presents emergency department today for evaluation due to concern for possible PE.  Daughter at bedside assist with the history.  She states patient was diagnosed with pneumonia about 2 weeks ago after complaining of right lower chest pain.  She was started on a course of antibiotics.  She recently started complaining of the pain again and had a chest x-ray as an outpatient which showed findings concerning for pneumonia versus a possible pulmonary infarct she was sent here for further evaluation.  Patient denies any cough, shortness of breath.  She does complain of pain to the right lower chest.  She states it is worse when she lays flat.  Is not is worse with inspiration.  She denies any abdominal pain nausea vomiting.  Denies that her pain is worse with eating.  She has had no fevers.  Past Medical History:  Diagnosis Date  . Anemia   . Diabetes mellitus   . Hyperlipidemia   . Hypertension   . Osteoporosis   . Sickle cell trait ([place])   . Thalassemia, beta [facility])     Patient Active Problem List   Diagnosis Date Noted  . Dysphagia, neurologic   . History of back pain   . Goals of care, counseling/discussion   . Inadequate oral nutritional intake   . Altered mental status   . Palliative care by specialist   . Pressure injury of skin 01/13/2020  . Acute encephalopathy 01/13/2020  . PAD (peripheral artery disease) ([place]) 06/06/2019  . Pancreatic cyst 06/06/2019  . Class 1 obesity due to excess calories with serious comorbidity and body mass index (BMI) of 31.0 to 31.9 in adult 06/06/2019  . Cyst of left kidney  03/10/2019  . Lumbago 02/27/2019  . Facet arthritis of lumbar region 02/27/2019  . Abnormal CT of the chest 12/28/2018  . Atrophic vaginitis 12/15/2018  . Atypical squamous cells of undetermined significance on cytologic smear of cervix (ASC-US) 12/15/2018  . Overweight with body mass index (BMI) 25.0-29.9 12/15/2018  . 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 Osteoporosis 12/15/2018  . Unspecified dyspareunia (CODE) 12/15/2018  . 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 Gastroesophageal reflux disease 08/26/2016  . Aortic valve regurgitation 02/11/2016  . Chest pain at rest 01/30/2015  . Atherosclerosis of native coronary artery of native heart without angina pectoris 12/24/2012  . Benign essential HTN 12/24/2012  . Hyperlipidemia 12/24/2012  . 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 03/10/2012  . Diabetes mellitus with stage 4 chronic kidney disease ([place]) 03/10/2012    Past Surgical History:  Procedure Laterality Date  . BONE RESECTION  01/2006   wrist  . 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 TUBAL LIGATION  04/28/1977     OB History   No obstetric history on file.     Family History  Problem Relation Age of Onset  . Diabetes Mother   . Heart disease Mother   . Diabetes Father   . Heart disease Father   . Diabetes Sister   . Diabetes Maternal Aunt   . Diabetes Maternal Uncle   . Diabetes Maternal Grandmother   .  Diabetes Maternal Grandfather     Social History   Tobacco Use  . Smoking status: Former Smoker    Packs/day: 0.25    Years: 40.00    Pack years: 10.00    Types: Cigarettes    Quit date: 04/2005    Years since quitting: 15.0  . Smokeless tobacco: Never Used  Vaping Use  . Vaping Use: Never used  Substance Use Topics  . Alcohol use: Not Currently    Comment: occasionally  . Drug use: No    Home Medications Prior to Admission medications   Medication Sig Start Date  End Date Taking? Authorizing Provider  acetaminophen (TYLENOL) 500 MG tablet Take 1 tablet (500 mg total) by mouth every 6 (six) hours as needed for moderate pain or fever. 02/14/20   Oswald Hillock, MD  amLODipine (NORVASC) 10 MG tablet Take 1 tablet (10 mg total) by mouth daily. 02/15/20   Oswald Hillock, MD  Continuous Blood Gluc Receiver (FREESTYLE LIBRE 14 DAY READER) DEVI Use as directed to check blood sugars 4 times per day dx: e11.65 02/22/20   Glendale Chard, MD  Continuous Blood Gluc Sensor (FREESTYLE LIBRE 14 DAY SENSOR) MISC Use as directed to check blood sugars 4 times per day dx: e11.65 02/22/20   Glendale Chard, MD  diclofenac Sodium (VOLTAREN) 1 % GEL Apply 2 g topically 4 (four) times daily. Apply to left knee. 02/15/20   Arrien, Jimmy Picket, MD  DULoxetine (CYMBALTA) 60 MG capsule Take 1 capsule (60 mg total) by mouth daily. Patient not taking: Reported on 04/04/2020 03/26/20   Glendale Chard, MD  gabapentin (NEURONTIN) 100 MG capsule One capsule po qhs 04/17/20   Glendale Chard, MD  insulin aspart (NOVOLOG) 100 UNIT/ML injection Inject 0-15 Units into the skin 3 (three) times daily with meals. Sliding scale insulin Less than 70 initiate hypoglycemia protocol 70-120  0 units 120-150 2 unit 151-200 3 units 201-250 5 units 251-300 8 units 301-350 11 units 351-400 15 units  Greater than 400 call MD Patient not taking: Reported on 04/04/2020 02/14/20   Oswald Hillock, MD  insulin aspart (NOVOLOG) 100 UNIT/ML injection Inject 4 Units into the skin 3 (three) times daily with meals. Patient not taking: Reported on 04/04/2020 02/14/20   Oswald Hillock, MD  insulin detemir (LEVEMIR) 100 UNIT/ML injection Inject 0.12 mLs (12 Units total) into the skin 2 (two) times daily. Patient taking differently: Inject 12 Units into the skin 2 (two) times daily. 3 UNITS IN THE AM AND 6 UNITS IN THE PM 02/14/20   Oswald Hillock, MD  Insulin Lispro (HUMALOG ) Inject into the skin.    [provider]   Insulin Pen Needle (BD PEN NEEDLE NANO U/F) 32G X 4 MM MISC Use as directed with insulin pen 04/09/20   Glendale Chard, MD  metoprolol tartrate (LOPRESSOR) 100 MG tablet Take 1 tablet (100 mg total) by mouth 2 (two) times daily. 04/24/20   Glendale Chard, MD  thiamine 100 MG tablet Take 1 tablet (100 mg total) by mouth daily. 02/15/20   Oswald Hillock, MD    Allergies    Cefepime and Lactose intolerance (gi)  Review of Systems   Review of Systems  Constitutional: Negative for chills and fever.  HENT: Negative for ear pain and sore throat.   Eyes: Negative for pain and visual disturbance.  Respiratory: Negative for cough and shortness of breath.   Cardiovascular: Positive for chest pain and leg swelling (improved).  Gastrointestinal:  Negative for abdominal pain, constipation, diarrhea, nausea and vomiting.  Genitourinary: Negative for dysuria and hematuria.  Musculoskeletal: Negative for back pain.  Skin: Negative for color change and rash.  Neurological: Negative for seizures and syncope.  All other systems reviewed and are negative.   Physical Exam Updated Vital Signs BP (!) 144/91   Pulse 82   Temp 98.2 F (36.8 C) (Oral)   Resp 18   Ht 5' 5" (1.651 m)   Wt 71.7 kg   SpO2 98%   BMI 26.29 kg/m   Physical Exam Vitals and nursing note reviewed.  Constitutional:      General: She is not in acute distress.    Appearance: She is well-developed.  HENT:     Head: Normocephalic and atraumatic.  Eyes:     Conjunctiva/sclera: Conjunctivae normal.  Cardiovascular:     Rate and Rhythm: Normal rate and regular rhythm.     Pulses: Normal pulses.     Heart sounds: Normal heart sounds. No murmur heard.   Pulmonary:     Effort: Pulmonary effort is normal. No respiratory distress.     Breath sounds: Rales (RLL) present.  Abdominal:     General: Bowel sounds are normal.     Palpations: Abdomen is soft.     Tenderness: There is no abdominal tenderness. There is no guarding or  rebound.     Comments: Mild ruq ttp with deep palpation but no grimace or indication of pain  Musculoskeletal:     Cervical back: Neck supple.  Skin:    General: Skin is warm and dry.  Neurological:     Mental Status: She is alert.     ED Results / Procedures / Treatments   Labs (all labs ordered are listed, but only abnormal results are displayed) Labs Reviewed  CBC WITH DIFFERENTIAL/PLATELET - Abnormal; Notable for the following components:      Result Value   Hemoglobin 9.4 (*)    HCT 30.4 (*)    MCV 76.2 (*)    MCH 23.6 (*)    All other components within normal limits  COMPREHENSIVE METABOLIC PANEL - Abnormal; Notable for the following components:   CO2 21 (*)    Glucose, Bld 123 (*)    BUN 24 (*)    Creatinine, Ser 1.91 (*)    Calcium 8.7 (*)    Total Protein 6.4 (*)    Albumin 3.0 (*)    AST 13 (*)    GFR, Estimated 26 (*)    All other components within normal limits  CBG MONITORING, ED - Abnormal; Notable for the following components:   Glucose-Capillary 68 (*)    All other components within normal limits  CBG MONITORING, ED  TROPONIN I (HIGH SENSITIVITY)    EKG EKG Interpretation  Date/Time:  Wednesday April 25 2020 20:03:45 EDT Ventricular Rate:  73 PR Interval:    QRS Duration: 88 QT Interval:  406 QTC Calculation: 448 R Axis:   34 Text Interpretation: Sinus rhythm unremarkable ECG Confirmed by Carmin Muskrat [phone]) on 04/25/2020 8:17:05 PM   Radiology DG Chest 2 View  Result Date: 04/25/2020 CLINICAL DATA:  Right lower chest pain EXAM: CHEST - 2 VIEW COMPARISON:  04/04/2020 FINDINGS: Pleural-based opacity in the right base. Mild airspace disease at the right base. Left lung is clear. Normal cardiac size. No pneumothorax. IMPRESSION: Pleural-based opacity at the right base may reflect loculated pleural effusion. Mild airspace disease at the right base. Electronically Signed   By: Maudie Mercury  Francoise Ceo M.D.   On: 04/25/2020 19:55   NM Pulmonary  Perfusion  Result Date: 04/25/2020 CLINICAL DATA:  Positive D-dimer.  Chest pain EXAM: NUCLEAR MEDICINE PERFUSION LUNG SCAN TECHNIQUE: Perfusion images were obtained in multiple projections after intravenous injection of radiopharmaceutical. Ventilation scans intentionally deferred if perfusion scan and chest x-ray adequate for interpretation during COVID 19 epidemic. RADIOPHARMACEUTICALS:  4.15 mCi Tc-79mMAA IV COMPARISON:  None. FINDINGS: No wedge-shaped perfusion defects seen to suggest pulmonary embolus. IMPRESSION: No evidence of pulmonary embolus. Electronically Signed   By: KRolm BaptiseM.D.   On: 04/25/2020 22:57    Procedures Procedures (including critical care time)  Medications Ordered in ED Medications  technetium albumin aggregated (MAA) injection solution 42.42millicurie (49.98millicuries Intravenous Contrast Given 04/25/20 1000)    ED Course  I have reviewed the triage vital signs and the nursing notes.  Pertinent labs & imaging results that were available during my care of the patient were reviewed by me and considered in my medical decision making (see chart for details).    MDM Rules/Calculators/A&P                          74year old female presented emergency department today for evaluation due to concern for possible PE.  Diagnosed with pneumonia 2 weeks ago and had a repeat chest x-ray today due to right chest pain which showed a possible infiltrate versus pulmonary infarct so sent here for further evaluation.  Reviewed/interpreted labs CBC without leukocytosis, anemia present but consistent with baseline CMP with normal electrolytes, slightly elevated BUN at 24 and elevated creatinine 1.9, this appears consistent with baseline.  LFTs are normal. Trop negative  CXR reviewed/interpreted - Pleural-based opacity at the right base may reflect loculated pleural effusion. Mild airspace disease at the right base.  VQ scan performed - neg for PE  CT chest ordered to  further evaluate for potential loculated pleural effusion. Care transitioned to LQuincy Carnesat shift change. If this is related to simple pleural effusion, pt likely safe for d/c home on current abx. However if loculated effusion may need pulm consult.    Final Clinical Impression(s) / ED Diagnoses Final diagnoses:  None    Rx / DC Orders ED Discharge Orders    None       CBishop Dublin10/21/21 0016    LCarmin Muskrat MD 04/26/20 [phone]

## 2020-04-25 NOTE — ED Triage Notes (Signed)
Pt BIB daughter who states that she had PNA at the beginning of this month that was treated but she began having pain under her R breast. HH took an Xray and they confirmed that she has a PE and needed to come to the hospital for evaluation and treatment. No SOB. 100% O2. Alert and oriented.

## 2020-04-26 ENCOUNTER — Telehealth: Payer: Self-pay

## 2020-04-26 DIAGNOSIS — N644 Mastodynia: Secondary | ICD-10-CM | POA: Diagnosis not present

## 2020-04-26 DIAGNOSIS — N184 Chronic kidney disease, stage 4 (severe): Secondary | ICD-10-CM | POA: Diagnosis not present

## 2020-04-26 DIAGNOSIS — G9341 Metabolic encephalopathy: Secondary | ICD-10-CM | POA: Diagnosis not present

## 2020-04-26 DIAGNOSIS — E1122 Type 2 diabetes mellitus with diabetic chronic kidney disease: Secondary | ICD-10-CM | POA: Diagnosis not present

## 2020-04-26 DIAGNOSIS — Z9884 Bariatric surgery status: Secondary | ICD-10-CM | POA: Diagnosis not present

## 2020-04-26 DIAGNOSIS — E1151 Type 2 diabetes mellitus with diabetic peripheral angiopathy without gangrene: Secondary | ICD-10-CM | POA: Diagnosis not present

## 2020-04-26 DIAGNOSIS — R1310 Dysphagia, unspecified: Secondary | ICD-10-CM | POA: Diagnosis not present

## 2020-04-26 DIAGNOSIS — I2510 Atherosclerotic heart disease of native coronary artery without angina pectoris: Secondary | ICD-10-CM | POA: Diagnosis not present

## 2020-04-26 DIAGNOSIS — I272 Pulmonary hypertension, unspecified: Secondary | ICD-10-CM | POA: Diagnosis not present

## 2020-04-26 DIAGNOSIS — Z794 Long term (current) use of insulin: Secondary | ICD-10-CM | POA: Diagnosis not present

## 2020-04-26 DIAGNOSIS — Z8709 Personal history of other diseases of the respiratory system: Secondary | ICD-10-CM | POA: Diagnosis not present

## 2020-04-26 DIAGNOSIS — I129 Hypertensive chronic kidney disease with stage 1 through stage 4 chronic kidney disease, or unspecified chronic kidney disease: Secondary | ICD-10-CM | POA: Diagnosis not present

## 2020-04-26 DIAGNOSIS — E440 Moderate protein-calorie malnutrition: Secondary | ICD-10-CM | POA: Diagnosis not present

## 2020-04-26 NOTE — Telephone Encounter (Signed)
The pt;s daughter said that the pt was told that she has some residual fluid in her lungs from the pneumonia and hasn't complained of any pain in her chest today when asked how the pt is doing.

## 2020-04-26 NOTE — ED Notes (Signed)
Patient transported to CT 

## 2020-04-26 NOTE — ED Provider Notes (Signed)
Assumed care from PA Kochar at shift change. See prior note for full H&P. Briefly, 74 year old female currently being treated for pneumonia, sent here with concern of pulmonary infarct on x-ray today. She does have pain on her right side but has not had any cough, fever, or shortness of breath. Labs grossly reassuring. VQ scan negative for PE but chest x-ray is concerning for possible loculated pleural effusion.  Plan: CT without contrast given renal function. Will follow results.  Results for orders placed or performed during the hospital encounter of 04/25/20  CBC with Differential  Result Value Ref Range   WBC 7.8 4.0 - 10.5 K/uL   RBC 3.99 3.87 - 5.11 MIL/uL   Hemoglobin 9.4 (L) 12.0 - 15.0 g/dL   HCT 30.4 (L) 36 - 46 %   MCV 76.2 (L) 80.0 - 100.0 fL   MCH 23.6 (L) 26.0 - 34.0 pg   MCHC 30.9 30.0 - 36.0 g/dL   RDW 15.2 11.5 - 15.5 %   Platelets 305 150 - 400 K/uL   nRBC 0.0 0.0 - 0.2 %   Neutrophils Relative % 57 %   Neutro Abs 4.4 1.7 - 7.7 K/uL   Lymphocytes Relative 34 %   Lymphs Abs 2.7 0.7 - 4.0 K/uL   Monocytes Relative 7 %   Monocytes Absolute 0.5 0.1 - 1.0 K/uL   Eosinophils Relative 2 %   Eosinophils Absolute 0.2 0.0 - 0.5 K/uL   Basophils Relative 0 %   Basophils Absolute 0.0 0.0 - 0.1 K/uL   Immature Granulocytes 0 %   Abs Immature Granulocytes 0.02 0.00 - 0.07 K/uL  Comprehensive metabolic panel  Result Value Ref Range   Sodium 137 135 - 145 mmol/L   Potassium 3.7 3.5 - 5.1 mmol/L   Chloride 106 98 - 111 mmol/L   CO2 21 (L) 22 - 32 mmol/L   Glucose, Bld 123 (H) 70 - 99 mg/dL   BUN 24 (H) 8 - 23 mg/dL   Creatinine, Ser 1.91 (H) 0.44 - 1.00 mg/dL   Calcium 8.7 (L) 8.9 - 10.3 mg/dL   Total Protein 6.4 (L) 6.5 - 8.1 g/dL   Albumin 3.0 (L) 3.5 - 5.0 g/dL   AST 13 (L) 15 - 41 U/L   ALT 9 0 - 44 U/L   Alkaline Phosphatase 60 38 - 126 U/L   Total Bilirubin 0.4 0.3 - 1.2 mg/dL   GFR, Estimated 26 (L) >60 mL/min   Anion gap 10 5 - 15  CBG monitoring, ED  Result  Value Ref Range   Glucose-Capillary 68 (L) 70 - 99 mg/dL  POC CBG, ED  Result Value Ref Range   Glucose-Capillary 92 70 - 99 mg/dL  Troponin I (High Sensitivity)  Result Value Ref Range   Troponin I (High Sensitivity) 7 <18 ng/L   CT Abdomen Pelvis Wo Contrast  Result Date: 04/09/2020 CLINICAL DATA:  Acute abdominal pain. Right upper quadrant pain for 5 days. Fall 3-4 weeks ago, initial encounter. Sickle cell. EXAM: CT ABDOMEN AND PELVIS WITHOUT CONTRAST TECHNIQUE: Multidetector CT imaging of the abdomen and pelvis was performed following the standard protocol without IV contrast. COMPARISON:  CT chest 09/23/2019 and CT abdomen pelvis 02/26/2019. MR abdomen 03/30/2019. FINDINGS: Lower chest: There is airspace consolidation and ground-glass in the right lower lobe with a small to moderate right pleural effusion. Findings are incompletely imaged. Heart is at the upper limits of normal in size. Decreased attenuation of the intravascular compartment is indicative of anemia. No  pericardial effusion. Esophagus is grossly unremarkable. Hepatobiliary: Liver is unremarkable. Stones layer in the gallbladder. No biliary ductal dilatation. Pancreas: Negative. Spleen: Negative. Adrenals/Urinary Tract: No urinary stones. 1.8 cm low-attenuation lesion in the interpolar right kidney is likely a cyst. Kidneys are otherwise unremarkable. Ureters are decompressed. Ventral bladder wall thickening. Small cystocele. Stomach/Bowel: Postoperative changes in the stomach. Small bowel and colon are unremarkable. Appendix is not readily visualized. Vascular/Lymphatic: Atherosclerotic calcification of the aorta without aneurysm. No pathologically enlarged lymph nodes. Reproductive: Uterus is visualized.  No adnexal mass. Other: No free fluid.  Mesenteries and peritoneum are unremarkable. 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 Mottled sclerosis within the lumbar vertebral bodies is in  keeping with sickle cell anemia. No fracture. IMPRESSION: 1. Right lower lobe pneumonia and small to moderate right pleural effusion. No additional findings to explain the patient's right upper quadrant pain. 2. Cholelithiasis. 3. Mild ventral bladder wall thickening and small cystocele. 4. 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 5. Cystic lesions in the pancreas and left kidney, as seen discussed on 03/30/2019, are poorly visualized on today's study. 6.  Aortic atherosclerosis (ICD10-I70.0). Electronically Signed   By: Lorin Picket M.D.   On: 04/09/2020 09:19   DG Chest 2 View  Result Date: 04/25/2020 CLINICAL DATA:  Right lower chest pain EXAM: CHEST - 2 VIEW COMPARISON:  04/04/2020 FINDINGS: Pleural-based opacity in the right base. Mild airspace disease at the right base. Left lung is clear. Normal cardiac size. No pneumothorax. IMPRESSION: Pleural-based opacity at the right base may reflect loculated pleural effusion. Mild airspace disease at the right base. Electronically Signed   By: Donavan Foil M.D.   On: 04/25/2020 19:55   DG Chest 2 View  Result Date: 04/04/2020 CLINICAL DATA:  Chest wall pain EXAM: CHEST - 2 VIEW COMPARISON:  11/02/2017 chest radiograph. FINDINGS: Stable cardiomediastinal silhouette with normal heart size. No pneumothorax. No pleural effusion. Lungs appear clear, with no acute consolidative airspace disease and no pulmonary edema. IMPRESSION: No active cardiopulmonary disease. Electronically Signed   By: Ilona Sorrel M.D.   On: 04/04/2020 19:33   CT Chest Wo Contrast  Result Date: 04/26/2020 CLINICAL DATA:  Pleural effusion on chest radiograph. Pain under the right breast. Treated for pneumonia earlier this month. EXAM: CT CHEST WITHOUT CONTRAST TECHNIQUE: Multidetector CT imaging of the chest was performed following the standard protocol without IV contrast. COMPARISON:  Chest radiograph 04/25/2020.  CT chest 09/23/2019 FINDINGS:  Cardiovascular: Normal heart size. No pericardial effusions. Coronary artery and aortic calcifications. Normal caliber thoracic aorta. Mediastinum/Nodes: No significant lymphadenopathy in the chest. Esophagus is decompressed. Calcified nodule in the left thyroid measuring 1.5 cm diameter. No change since prior study. Lungs/Pleura: Patchy and confluent areas of consolidation in the right lower lung, likely pneumonia. No significant pleural effusion. The chest radiographic appearance is caused by the consolidation. The left lung is clear. Airways are patent. Upper Abdomen: Postoperative changes consistent with gastric bypass. Cholelithiasis with small layering stones in the gallbladder. Musculoskeletal: Degenerative changes in the spine. No destructive bone lesions. IMPRESSION: 1. Patchy and confluent areas of consolidation in the right lower lung, likely pneumonia. The chest radiographic appearance is caused by the consolidation. No pleural effusions. 2. Calcified nodule in the left thyroid measuring 1.5 cm diameter. No change since prior study. 3. Cholelithiasis. 4. Postoperative changes consistent with gastric bypass. 5. Aortic atherosclerosis. Aortic Atherosclerosis (ICD10-I70.0). Electronically Signed   By: Oren Beckmann.D.  On: 04/26/2020 00:49   NM Pulmonary Perfusion  Result Date: 04/25/2020 CLINICAL DATA:  Positive D-dimer.  Chest pain EXAM: NUCLEAR MEDICINE PERFUSION LUNG SCAN TECHNIQUE: Perfusion images were obtained in multiple projections after intravenous injection of radiopharmaceutical. Ventilation scans intentionally deferred if perfusion scan and chest x-ray adequate for interpretation during COVID 19 epidemic. RADIOPHARMACEUTICALS:  4.15 mCi Tc-62mMAA IV COMPARISON:  None. FINDINGS: No wedge-shaped perfusion defects seen to suggest pulmonary embolus. IMPRESSION: No evidence of pulmonary embolus. Electronically Signed   By: KRolm BaptiseM.D.   On: 04/25/2020 22:57     CT with findings  of consolidation in right lower lung consistent with pneumonia.  There is no pleural effusion or loculated fluid collection.  Patient is already on doxycycline for CAP.  She remains HD stable here in the ED, good O2 sats without SOB.  Feel she is stable for discharge home.  Will continue current antibiotic therapy and have her follow-up with PCP.  Return here for new/acute changes.   SLarene Pickett PA-C 04/26/20 0138    [place], JJenny Reichmann MD 04/26/20 [phone]

## 2020-04-26 NOTE — Discharge Instructions (Signed)
Scan today was negative for pulmonary embolism. There are findings of pneumonia in the right lower lung. Continue the prescribed doxycycline for this until finished. I have attached copies of labs and imaging studies on back of visit for physician review. Close follow-up with primary care doctor. Return here for any new/acute changes.

## 2020-04-27 DIAGNOSIS — Z794 Long term (current) use of insulin: Secondary | ICD-10-CM | POA: Diagnosis not present

## 2020-04-27 DIAGNOSIS — M79601 Pain in right arm: Secondary | ICD-10-CM | POA: Diagnosis not present

## 2020-04-27 DIAGNOSIS — E1151 Type 2 diabetes mellitus with diabetic peripheral angiopathy without gangrene: Secondary | ICD-10-CM | POA: Diagnosis not present

## 2020-04-27 DIAGNOSIS — Z9884 Bariatric surgery status: Secondary | ICD-10-CM | POA: Diagnosis not present

## 2020-04-27 DIAGNOSIS — M79641 Pain in right hand: Secondary | ICD-10-CM | POA: Insufficient documentation

## 2020-04-27 DIAGNOSIS — E440 Moderate protein-calorie malnutrition: Secondary | ICD-10-CM | POA: Diagnosis not present

## 2020-04-27 DIAGNOSIS — I272 Pulmonary hypertension, unspecified: Secondary | ICD-10-CM | POA: Diagnosis not present

## 2020-04-27 DIAGNOSIS — I129 Hypertensive chronic kidney disease with stage 1 through stage 4 chronic kidney disease, or unspecified chronic kidney disease: Secondary | ICD-10-CM | POA: Diagnosis not present

## 2020-04-27 DIAGNOSIS — E1122 Type 2 diabetes mellitus with diabetic chronic kidney disease: Secondary | ICD-10-CM | POA: Diagnosis not present

## 2020-04-27 DIAGNOSIS — R1310 Dysphagia, unspecified: Secondary | ICD-10-CM | POA: Diagnosis not present

## 2020-04-27 DIAGNOSIS — I2510 Atherosclerotic heart disease of native coronary artery without angina pectoris: Secondary | ICD-10-CM | POA: Diagnosis not present

## 2020-04-27 DIAGNOSIS — N184 Chronic kidney disease, stage 4 (severe): Secondary | ICD-10-CM | POA: Diagnosis not present

## 2020-04-27 DIAGNOSIS — G9341 Metabolic encephalopathy: Secondary | ICD-10-CM | POA: Diagnosis not present

## 2020-04-27 DIAGNOSIS — M25511 Pain in right shoulder: Secondary | ICD-10-CM | POA: Diagnosis not present

## 2020-04-27 HISTORY — DX: Pain in right hand: M79.641

## 2020-04-30 ENCOUNTER — Ambulatory Visit: Payer: Medicare Other

## 2020-04-30 DIAGNOSIS — I129 Hypertensive chronic kidney disease with stage 1 through stage 4 chronic kidney disease, or unspecified chronic kidney disease: Secondary | ICD-10-CM

## 2020-04-30 DIAGNOSIS — N184 Chronic kidney disease, stage 4 (severe): Secondary | ICD-10-CM

## 2020-04-30 DIAGNOSIS — E1122 Type 2 diabetes mellitus with diabetic chronic kidney disease: Secondary | ICD-10-CM

## 2020-04-30 NOTE — Patient Instructions (Signed)
Social Worker Visit Information  Goals we discussed today:  Goals Addressed            This Visit's Progress   . Collaborate with RN Care Manager to perform appropriate assessments to assist with care coordination needs post SNF discharge   On track    [place] (see longitudinal plan of care for additional care plan information)  Current Barriers:  . Limited access to caregiver . Recent discharge from SNF following a greater than 45 day hospital stay . Chronic conditions including DM II, Stage 4 CKD, HTN, and Hypertensive Nephropathy which put patient at increased risk for hospitalization  Social Work Clinical Goal(s):  [name] Over the next 60 days the patient and her family will work with care management team to address care coordination needs.  CCM SW Interventions: Completed 04/30/20 with Tedra Coupe . Successful outbound call placed to patient daughter/caregiver Tedra Coupe . Determined the patient continues to do well; she did have pneumonia recently but family feels she is improving o Patient able to attend church on 10/24 . Discussed the patient continues to work with [facility] PT/OT o Patient has been discharged from [place] . Patient actively engaged with Remote [facility] o Family is aware how to contact Remote Health as needed . Determined patient has a private duty aide in the home 4 hours per day to assist with care needs . Scheduled follow up call over the next two months o Encouraged [name] to contact SW as needed prior to next scheduled call  Completed 03/26/20 with Tedra Coupe . Inter-disciplinary care team collaboration (see longitudinal plan of care) . Successful outbound call placed to the patients daughter and caregiver, Tedra Coupe, in response to a voice message left for [place] regarding caregiver resources . Determined the family was actively interviewing "[employer]" agency for a caregiver at the time of SW call o Discussed  family concern of finding a "good" agency to provide services to the patient. Family requested SW feedback on [employer] o Advised Mrs. Randol Kern SW has not heard good or bad feedback regarding this agency care.  o Informed [name] of current barriers to finding caregivers due to COVID 19 pandemic. Discussed importance of hiring an agency if a caregiver is available and the family feels it is a good fit . Briefly discussed patient current in home DME: Patient has a wheelchair, walker, and bedside commode. No other DME needs are identified at this time o SW to follow up in 1 week to assess for newly identified needs . Patient is currently active with [facility] for PT, OT, and ST o Discussed the patient is not active with an RN for home health services o Briefly provided education surrounding ability to add nursing if needed o Advised Mrs. Randol Kern the therapy team can and will add a nurse to the patients care team as needed . Collaboration with [place] to inform of above interventions and plan o RN Care Manager agrees with home health agency managing disciplines needed for patients care at this time as they are in the home and can assess for patient needs . Scheduled follow up call over the next week  Patient Self Care Activities: (With the help of patient family) . Self administers medications as prescribed . Attends all scheduled provider appointments . Calls provider office for new concerns or questions  Please see past updates related to this goal by clicking on the "Past Updates"  button in the selected goal          Follow Up Plan: SW will follow up with patient by phone over the next two months.   Daneen Schick, BSW, CDP Social Worker, Certified Dementia Practitioner [place] [facility] [phone]

## 2020-04-30 NOTE — Chronic Care Management (AMB) (Signed)
Chronic Care Management    Social Work Follow Up Note  04/30/2020 Name: Miranda Roman MRN: [id number] DOB: [birth year]  Miranda Roman is a 74 y.o. year old female who is a primary care patient of Glendale Chard, MD. The CCM team was consulted for assistance with care coordination.   Review of patient status, including review of consultants reports, other relevant assessments, and collaboration with appropriate care team members and the patient's provider was performed as part of comprehensive patient evaluation and provision of chronic care management services.    SDOH (Social Determinants of Health) assessments performed: No    Outpatient Encounter Medications as of 04/30/2020  Medication Sig  . acetaminophen (TYLENOL) 500 MG tablet Take 1 tablet (500 mg total) by mouth every 6 (six) hours as needed for moderate pain or fever.  [name] amLODipine (NORVASC) 10 MG tablet Take 1 tablet (10 mg total) by mouth daily.  . Continuous Blood Gluc Receiver (FREESTYLE LIBRE 14 DAY READER) DEVI Use as directed to check blood sugars 4 times per day dx: e11.65  . Continuous Blood Gluc Sensor (FREESTYLE LIBRE 14 DAY SENSOR) MISC Use as directed to check blood sugars 4 times per day dx: e11.65  . diclofenac Sodium (VOLTAREN) 1 % GEL Apply 2 g topically 4 (four) times daily. Apply to left knee.  . DULoxetine (CYMBALTA) 60 MG capsule Take 1 capsule (60 mg total) by mouth daily. (Patient not taking: Reported on 04/04/2020)  . gabapentin (NEURONTIN) 100 MG capsule One capsule po qhs  . insulin aspart (NOVOLOG) 100 UNIT/ML injection Inject 0-15 Units into the skin 3 (three) times daily with meals. Sliding scale insulin Less than 70 initiate hypoglycemia protocol 70-120  0 units 120-150 2 unit 151-200 3 units 201-250 5 units 251-300 8 units 301-350 11 units 351-400 15 units  Greater than 400 call MD (Patient not taking: Reported on 04/04/2020)  . insulin aspart (NOVOLOG) 100 UNIT/ML injection Inject 4 Units  into the skin 3 (three) times daily with meals. (Patient not taking: Reported on 04/04/2020)  . insulin detemir (LEVEMIR) 100 UNIT/ML injection Inject 0.12 mLs (12 Units total) into the skin 2 (two) times daily. (Patient taking differently: Inject 12 Units into the skin 2 (two) times daily. 3 UNITS IN THE AM AND 6 UNITS IN THE PM)  . Insulin Lispro (HUMALOG Vincent) Inject into the skin.  . Insulin Pen Needle (BD PEN NEEDLE NANO U/F) 32G X 4 MM MISC Use as directed with insulin pen  . metoprolol tartrate (LOPRESSOR) 100 MG tablet Take 1 tablet (100 mg total) by mouth 2 (two) times daily.  [name] thiamine 100 MG tablet Take 1 tablet (100 mg total) by mouth daily.   No facility-administered encounter medications on file as of 04/30/2020.     Goals Addressed            This Visit's Progress   . Collaborate with RN Care Manager to perform appropriate assessments to assist with care coordination needs post SNF discharge   On track    [place] (see longitudinal plan of care for additional care plan information)  Current Barriers:  . Limited access to caregiver . Recent discharge from SNF following a greater than 45 day hospital stay . Chronic conditions including DM II, Stage 4 CKD, HTN, and Hypertensive Nephropathy which put patient at increased risk for hospitalization  Social Work Clinical Goal(s):  [name] Over the next 60 days the patient and her family will work with care management team to  address care coordination needs.  CCM SW Interventions: Completed 04/30/20 with Tedra Coupe . Successful outbound call placed to patient daughter/caregiver Tedra Coupe . Determined the patient continues to do well; she did have pneumonia recently but family feels she is improving o Patient able to attend church on 10/24 . Discussed the patient continues to work with [facility] PT/OT o Patient has been discharged from [place] . Patient actively engaged with Remote [facility] o Family is aware how  to contact Remote Health as needed . Determined patient has a private duty aide in the home 4 hours per day to assist with care needs . Scheduled follow up call over the next two months o Encouraged [name] to contact SW as needed prior to next scheduled call  Completed 03/26/20 with Tedra Coupe . Inter-disciplinary care team collaboration (see longitudinal plan of care) . Successful outbound call placed to the patients daughter and caregiver, Tedra Coupe, in response to a voice message left for [place] regarding caregiver resources . Determined the family was actively interviewing "[employer]" agency for a caregiver at the time of SW call o Discussed family concern of finding a "good" agency to provide services to the patient. Family requested SW feedback on [employer] o Advised Mrs. Randol Kern SW has not heard good or bad feedback regarding this agency care.  o Informed [name] of current barriers to finding caregivers due to COVID 19 pandemic. Discussed importance of hiring an agency if a caregiver is available and the family feels it is a good fit . Briefly discussed patient current in home DME: Patient has a wheelchair, walker, and bedside commode. No other DME needs are identified at this time o SW to follow up in 1 week to assess for newly identified needs . Patient is currently active with [facility] for PT, OT, and ST o Discussed the patient is not active with an RN for home health services o Briefly provided education surrounding ability to add nursing if needed o Advised Mrs. Randol Kern the therapy team can and will add a nurse to the patients care team as needed . Collaboration with [place] to inform of above interventions and plan o RN Care Manager agrees with home health agency managing disciplines needed for patients care at this time as they are in the home and can assess for patient needs . Scheduled follow up call over the next  week  Patient Self Care Activities: (With the help of patient family) . Self administers medications as prescribed . Attends all scheduled provider appointments . Calls provider office for new concerns or questions  Please see past updates related to this goal by clicking on the "Past Updates" button in the selected goal          Follow Up Plan: SW will follow up with patient by phone over the next two months.   Daneen Schick, BSW, CDP Social Worker, Certified Dementia Practitioner [place] [facility] [place] [facility] [phone]  Total time spent performing care coordination and/or care management activities with the patient by phone or face to face = 10 minutes.

## 2020-05-01 DIAGNOSIS — E440 Moderate protein-calorie malnutrition: Secondary | ICD-10-CM | POA: Diagnosis not present

## 2020-05-01 DIAGNOSIS — R1310 Dysphagia, unspecified: Secondary | ICD-10-CM | POA: Diagnosis not present

## 2020-05-01 DIAGNOSIS — I129 Hypertensive chronic kidney disease with stage 1 through stage 4 chronic kidney disease, or unspecified chronic kidney disease: Secondary | ICD-10-CM | POA: Diagnosis not present

## 2020-05-01 DIAGNOSIS — I2510 Atherosclerotic heart disease of native coronary artery without angina pectoris: Secondary | ICD-10-CM | POA: Diagnosis not present

## 2020-05-01 DIAGNOSIS — E1151 Type 2 diabetes mellitus with diabetic peripheral angiopathy without gangrene: Secondary | ICD-10-CM | POA: Diagnosis not present

## 2020-05-01 DIAGNOSIS — N184 Chronic kidney disease, stage 4 (severe): Secondary | ICD-10-CM | POA: Diagnosis not present

## 2020-05-01 DIAGNOSIS — Z9884 Bariatric surgery status: Secondary | ICD-10-CM | POA: Diagnosis not present

## 2020-05-01 DIAGNOSIS — Z794 Long term (current) use of insulin: Secondary | ICD-10-CM | POA: Diagnosis not present

## 2020-05-01 DIAGNOSIS — I272 Pulmonary hypertension, unspecified: Secondary | ICD-10-CM | POA: Diagnosis not present

## 2020-05-01 DIAGNOSIS — G9341 Metabolic encephalopathy: Secondary | ICD-10-CM | POA: Diagnosis not present

## 2020-05-01 DIAGNOSIS — E1122 Type 2 diabetes mellitus with diabetic chronic kidney disease: Secondary | ICD-10-CM | POA: Diagnosis not present

## 2020-05-03 ENCOUNTER — Encounter: Payer: Self-pay | Admitting: Neurology

## 2020-05-04 DIAGNOSIS — N2581 Secondary hyperparathyroidism of renal origin: Secondary | ICD-10-CM | POA: Diagnosis not present

## 2020-05-04 DIAGNOSIS — D631 Anemia in chronic kidney disease: Secondary | ICD-10-CM | POA: Diagnosis not present

## 2020-05-04 DIAGNOSIS — N184 Chronic kidney disease, stage 4 (severe): Secondary | ICD-10-CM | POA: Diagnosis not present

## 2020-05-04 DIAGNOSIS — E1122 Type 2 diabetes mellitus with diabetic chronic kidney disease: Secondary | ICD-10-CM | POA: Diagnosis not present

## 2020-05-04 DIAGNOSIS — N189 Chronic kidney disease, unspecified: Secondary | ICD-10-CM | POA: Diagnosis not present

## 2020-05-04 DIAGNOSIS — I129 Hypertensive chronic kidney disease with stage 1 through stage 4 chronic kidney disease, or unspecified chronic kidney disease: Secondary | ICD-10-CM | POA: Diagnosis not present

## 2020-05-07 DIAGNOSIS — M79621 Pain in right upper arm: Secondary | ICD-10-CM | POA: Diagnosis not present

## 2020-05-08 DIAGNOSIS — N184 Chronic kidney disease, stage 4 (severe): Secondary | ICD-10-CM | POA: Diagnosis not present

## 2020-05-08 DIAGNOSIS — E440 Moderate protein-calorie malnutrition: Secondary | ICD-10-CM | POA: Diagnosis not present

## 2020-05-08 DIAGNOSIS — R1310 Dysphagia, unspecified: Secondary | ICD-10-CM | POA: Diagnosis not present

## 2020-05-08 DIAGNOSIS — E1122 Type 2 diabetes mellitus with diabetic chronic kidney disease: Secondary | ICD-10-CM | POA: Diagnosis not present

## 2020-05-08 DIAGNOSIS — I129 Hypertensive chronic kidney disease with stage 1 through stage 4 chronic kidney disease, or unspecified chronic kidney disease: Secondary | ICD-10-CM | POA: Diagnosis not present

## 2020-05-08 DIAGNOSIS — Z9884 Bariatric surgery status: Secondary | ICD-10-CM | POA: Diagnosis not present

## 2020-05-08 DIAGNOSIS — E1151 Type 2 diabetes mellitus with diabetic peripheral angiopathy without gangrene: Secondary | ICD-10-CM | POA: Diagnosis not present

## 2020-05-08 DIAGNOSIS — Z794 Long term (current) use of insulin: Secondary | ICD-10-CM | POA: Diagnosis not present

## 2020-05-08 DIAGNOSIS — I2510 Atherosclerotic heart disease of native coronary artery without angina pectoris: Secondary | ICD-10-CM | POA: Diagnosis not present

## 2020-05-08 DIAGNOSIS — G9341 Metabolic encephalopathy: Secondary | ICD-10-CM | POA: Diagnosis not present

## 2020-05-08 DIAGNOSIS — I272 Pulmonary hypertension, unspecified: Secondary | ICD-10-CM | POA: Diagnosis not present

## 2020-05-09 DIAGNOSIS — G9341 Metabolic encephalopathy: Secondary | ICD-10-CM | POA: Diagnosis not present

## 2020-05-09 DIAGNOSIS — E440 Moderate protein-calorie malnutrition: Secondary | ICD-10-CM | POA: Diagnosis not present

## 2020-05-09 DIAGNOSIS — I272 Pulmonary hypertension, unspecified: Secondary | ICD-10-CM | POA: Diagnosis not present

## 2020-05-09 DIAGNOSIS — E1122 Type 2 diabetes mellitus with diabetic chronic kidney disease: Secondary | ICD-10-CM | POA: Diagnosis not present

## 2020-05-09 DIAGNOSIS — N184 Chronic kidney disease, stage 4 (severe): Secondary | ICD-10-CM | POA: Diagnosis not present

## 2020-05-09 DIAGNOSIS — Z794 Long term (current) use of insulin: Secondary | ICD-10-CM | POA: Diagnosis not present

## 2020-05-09 DIAGNOSIS — R1310 Dysphagia, unspecified: Secondary | ICD-10-CM | POA: Diagnosis not present

## 2020-05-09 DIAGNOSIS — I2510 Atherosclerotic heart disease of native coronary artery without angina pectoris: Secondary | ICD-10-CM | POA: Diagnosis not present

## 2020-05-09 DIAGNOSIS — E1151 Type 2 diabetes mellitus with diabetic peripheral angiopathy without gangrene: Secondary | ICD-10-CM | POA: Diagnosis not present

## 2020-05-09 DIAGNOSIS — M25511 Pain in right shoulder: Secondary | ICD-10-CM | POA: Diagnosis not present

## 2020-05-09 DIAGNOSIS — Z9884 Bariatric surgery status: Secondary | ICD-10-CM | POA: Diagnosis not present

## 2020-05-09 DIAGNOSIS — I129 Hypertensive chronic kidney disease with stage 1 through stage 4 chronic kidney disease, or unspecified chronic kidney disease: Secondary | ICD-10-CM | POA: Diagnosis not present

## 2020-05-10 ENCOUNTER — Other Ambulatory Visit: Payer: Self-pay

## 2020-05-10 ENCOUNTER — Encounter: Payer: Self-pay | Admitting: Internal Medicine

## 2020-05-10 ENCOUNTER — Ambulatory Visit (INDEPENDENT_AMBULATORY_CARE_PROVIDER_SITE_OTHER): Payer: Medicare Other | Admitting: Internal Medicine

## 2020-05-10 VITALS — BP 114/60 | HR 78 | Ht 64.75 in | Wt 154.8 lb

## 2020-05-10 DIAGNOSIS — I351 Nonrheumatic aortic (valve) insufficiency: Secondary | ICD-10-CM

## 2020-05-10 DIAGNOSIS — I2583 Coronary atherosclerosis due to lipid rich plaque: Secondary | ICD-10-CM | POA: Diagnosis not present

## 2020-05-10 DIAGNOSIS — I2510 Atherosclerotic heart disease of native coronary artery without angina pectoris: Secondary | ICD-10-CM | POA: Diagnosis not present

## 2020-05-10 DIAGNOSIS — I272 Pulmonary hypertension, unspecified: Secondary | ICD-10-CM

## 2020-05-10 NOTE — Progress Notes (Signed)
OFFICE NOTE  Chief Complaint:  Follow-up  Primary Care Physician: Glendale Chard, MD  HPI:  Miranda Roman is a 74 year old female with a history of mild coronary disease by cath in 2007 at the ostium of the left circumflex, also has insulin dependent diabetes, hypertension, dyslipidemia, and morbid obesity; however, she underwent weight loss surgery and had significant improvement in her weight. Recently she has been struggling with a sinus infection, is on some prednisone. Blood pressure was elevated a little bit today at 140/70; however, she also had not taken her blood pressure medication.   Miranda Roman returns today and is without complaints. She denies any shortness of breath, chest pain or palpitations .  I had the pleasure of seeing Miranda Roman back in the office today. Recently she was seen by her primary care provider and had complained of some chest pain. This was occurring over a couple of days and noted mostly at rest however sometimes with exertion. She reports not being particularly active. She says it is since resolved and she's not had recurrence. It was not necessarily associated with eating or similar to her reflux symptoms in the past. She did not take medication for it. Her last stress test was in 2010. She does have mild coronary artery disease based on catheterization in 2007.  02/11/2016  Miranda Roman returns today for follow-up. She has no chest pain or worsening shortness of breath. In general she's done fairly well. Blood pressures been well controlled at home and was 131/84 today which she says is "high" for her. She's had no problems with atorvastatin. She takes quinapril/HCTZ. She recently had blood work through her primary care provider in a complete physical and we'll request those numbers. We are monitoring long-term for aortic insufficiency which was seen on her last echo in August 2016.  08/26/2016  Miranda Roman returns today at the request of Dr.  Baird Cancer for reevaluation of chest pain. She has had similar pain like this in the past. She underwent nuclear stress test in 2016 which was low risk and negative for ischemia. Recently some of her symptoms seem to be worse in exaggerated by eating foods. She had been previously told that she had reflux symptoms but was not on any medications. She does have a history of gastric bypass surgery. I wonder if she could have a hiatal hernia that is acting up. She was started on omeprazole last week and notes that she's not had any further symptoms since then.  02/24/2017  Miranda Roman was seen today in follow-up. I last saw her in February where she had some pain in the back of her chest which radiated around to the front. She says that ultimately she lightened up her purse and she noted improvement in her symptoms. It could be that she had thoracic radiculopathy. She just had a repeat echo which shows a stable normal LVEF and mild aortic insufficiency. She denies any recurrent chest pain symptoms.  03/02/2018  Miranda Roman is seen today in routine annual follow-up.  Over the past year she denies any new chest pain or worsening shortness of breath.  She has had issues with pain in the left shoulder and neck at times which was thought to be due to radiculopathy.  Echo had shown mild aortic insufficiency in the past.  Blood pressures well controlled today.  Lab work in April 2019 showed total cholesterol 131, HDL 42, LDL 74 and triglycerides 77.  Hemoglobin A1c is above target at 8.5.  03/21/2019  Miranda Roman returns today for follow-up.  She denies any chest pain or worsening shortness of breath.  She is due for repeat lipid profile which will be drawn today.  Goal LDL is less than 70.  Blood pressure is at goal today.  05/10/2020  Miranda Roman is seen today in follow-up.  Unfortunately she has had an eventful past several months.  Apparently over the summer she was hospitalized in the [place] area after  developing encephalopathy.  This occurred apparently after swimming in fresh water with [place].  She had extensive work-up and then was ultimately transferred up to [facility] for which she was hospitalized in thoroughly evaluated and treated for multiple possible etiologies including possible infectious diseases, autoimmune etiologies and even there was concern for possible Warnicke's encephalopathy.  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  From a cardiac standpoint she has had no worsening shortness of breath or chest pain symptoms.  She did have an echo at [facility] in June 2021 which showed an EF of 50 to 54%, mild aortic insufficiency and mild to moderate tricuspid regurgitation.  RVSP was 65 mmHg suggesting moderate pulmonary hypertension.  No further imaging has been performed since then.  Again she is asymptomatic with exertion.  PMHx:  Past Medical History:  Diagnosis Date  . Anemia   . Diabetes mellitus   . Hyperlipidemia   . Hypertension   . Osteoporosis   . Sickle cell trait ([place])   . 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 BONE RESECTION  01/2006   wrist  . 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 TUBAL LIGATION  04/28/1977    FAMHx:  Family History  Problem Relation Age of Onset  . Diabetes Mother   . Heart disease Mother   . Diabetes Father   . Heart disease Father   . Diabetes Sister   . Diabetes Maternal Aunt   . Diabetes Maternal Uncle   . Diabetes Maternal Grandmother   . Diabetes Maternal Grandfather     SOCHx:   reports that she quit smoking about 15 years ago. Her smoking use included cigarettes. She has a 10.00 pack-year smoking history. She has never used smokeless tobacco. She reports previous alcohol use. She reports that she does not use drugs.  ALLERGIES:  Allergies    Allergen Reactions  . Cefepime Other (See Comments)    Causes encephalitis - reported by [facility] 12/31/2019  . Lactose Intolerance (Gi)     Per pt's daughter causes gas    ROS: Pertinent items noted in HPI and remainder of comprehensive ROS otherwise negative.  HOME MEDS: Current Outpatient Medications  Medication Sig Dispense Refill  . acetaminophen (TYLENOL) 500 MG tablet Take 1 tablet (500 mg total) by mouth every 6 (six) hours as needed for moderate pain or fever. 30 tablet 0  . amLODipine (NORVASC) 10 MG tablet Take 1 tablet (10 mg total) by mouth daily.    . Continuous Blood Gluc Receiver (FREESTYLE LIBRE 14 DAY READER) DEVI Use as directed to check blood sugars 4 times per day dx: e11.65 1 each 3  . Continuous Blood Gluc Sensor (FREESTYLE LIBRE 14 DAY SENSOR) MISC Use as directed to check blood sugars 4 times per day dx: e11.65 2 each 3  . diclofenac Sodium (VOLTAREN)  1 % GEL Apply 2 g topically 4 (four) times daily. Apply to left knee. 50 g 0  . insulin detemir (LEVEMIR) 100 UNIT/ML injection Inject 0.12 mLs (12 Units total) into the skin 2 (two) times daily. (Patient taking differently: Inject 12 Units into the skin 2 (two) times daily. 3 UNITS IN THE AM AND 6 UNITS IN THE PM) 10 mL 11  . Insulin Lispro (HUMALOG [place]) Inject into the skin.    . Insulin Pen Needle (BD PEN NEEDLE NANO U/F) 32G X 4 MM MISC Use as directed with insulin pen 150 each 2  . metoprolol tartrate (LOPRESSOR) 100 MG tablet Take 1 tablet (100 mg total) by mouth 2 (two) times daily. (Patient taking differently: Take 100 mg by mouth 2 (two) times daily. Break tab in half. Pt. Takes 50 mg two times a day. 100 mg total.) 90 tablet 1  . thiamine 100 MG tablet Take 1 tablet (100 mg total) by mouth daily.    . DULoxetine (CYMBALTA) 60 MG capsule Take 1 capsule (60 mg total) by mouth daily. (Patient not taking: Reported on 04/04/2020) 90 capsule 0  . gabapentin (NEURONTIN) 100 MG capsule One capsule po qhs (Patient  not taking: Reported on 05/10/2020) 30 capsule 2   No current facility-administered medications for this visit.    LABS/IMAGING: No results found for this or any previous visit (from the past 48 hour(s)). No results found.  VITALS: BP 114/60   Pulse 78   Ht 5' 4.75" (1.645 m)   Wt 154 lb 12.8 oz (70.2 kg)   SpO2 97%   BMI 25.96 kg/m   EXAM: General appearance: alert and no distress Neck: no adenopathy, no carotid bruit, no JVD, supple, symmetrical, trachea midline and thyroid not enlarged, symmetric, no tenderness/mass/nodules Lungs: clear to auscultation bilaterally Heart: regular rate and rhythm, S1, S2 normal and diastolic murmur: early diastolic 3/6, blowing at 2nd right intercostal space Abdomen: soft, non-tender; bowel sounds normal; no masses,  no organomegaly Extremities: extremities normal, atraumatic, no cyanosis or edema Pulses: 2+ and symmetric Skin: Skin color, texture, turgor normal. No rashes or lesions Neurologic: Grossly normal  EKG: Normal sinus rhythm 78, nonspecific T wave changes-personally reviewed  ASSESSMENT: 1. Chest pain at rest- low risk myoview 2016 - may have been a thoracic radiculopathy 2. History of morbid obesity status post bariatric surgery 3. Diabetes, no longer insulin-dependent 4. Hypertension - managed by primary 5. Hyperlipidemia - managed by primary 6. AI/MR 7. Mild to moderate MR - pulmonary hypertension, LVEF 50-54% (echo in 12/2019)  PLAN: 1.  Miranda Roman had recent episode of encephalopathy of still unknown etiology.  She is apparently improving slowly.  Echo performed this past summer shows stable aortic insufficiency with some trivial mitral regurgitation and mild to moderate tricuspid regurgitation as well as moderate pulmonary hypertension.  Since these findings are somewhat different than her prior echo in 2018, would like to follow-up on it and would recommend an echo in about 6 months after she is further recovered.  I will  follow up with her at that time.  No medication changes today.  Pixie Casino, MD, [facility], [place] Director of the Advanced Lipid Disorders &  Cardiovascular Risk Reduction Clinic Diplomate of the American Board of Clinical Lipidology Attending Cardiologist  Direct Dial: [phone]  Fax: [phone]  Website:  [email or web address] 05/10/2020, 12:24 PM

## 2020-05-10 NOTE — Patient Instructions (Signed)
Medication Instructions:  Your physician recommends that you continue on your current medications as directed. Please refer to the Current Medication list given to you today.  *If you need a refill on your cardiac medications before your next appointment, please call your pharmacy*  Testing/Procedures: Your physician has requested that you have an echocardiogram. Echocardiography is a painless test that uses sound waves to create images of your heart. It provides your doctor with information about the size and shape of your heart and how well your heart's chambers and valves are working. This procedure takes approximately one hour. There are no restrictions for this procedure. -- due in 6 months  -- [address]   Follow-Up: At [facility], you and your health needs are our priority.  As part of our continuing mission to provide you with exceptional heart care, we have created designated Provider Care Teams.  These Care Teams include your primary Cardiologist (physician) and Advanced Practice Providers (APPs -  Physician Assistants and Nurse Practitioners) who all work together to provide you with the care you need, when you need it.  We recommend signing up for the patient portal called "MyChart".  Sign up information is provided on this After Visit Summary.  MyChart is used to connect with patients for Virtual Visits (Telemedicine).  Patients are able to view lab/test results, encounter notes, upcoming appointments, etc.  Non-urgent messages can be sent to your provider as well.   To learn more about what you can do with MyChart, go to [email or web address].    Your next appointment:   6 month(s)  The format for your next appointment:   In Person  Provider:   You may see Dr. Debara Pickett or one of the following Advanced Practice Providers on your designated Care Team:    Almyra Deforest, PA-C  Fabian Sharp, [place] or   Roby Lofts, [place]    Other Instructions

## 2020-05-11 ENCOUNTER — Telehealth: Payer: Self-pay

## 2020-05-11 NOTE — Telephone Encounter (Signed)
I called and left pt v/m letting her know that her handicap placard has been completed and she can come pick it up. YL,RMA

## 2020-05-14 DIAGNOSIS — Z794 Long term (current) use of insulin: Secondary | ICD-10-CM | POA: Diagnosis not present

## 2020-05-14 DIAGNOSIS — G9341 Metabolic encephalopathy: Secondary | ICD-10-CM | POA: Diagnosis not present

## 2020-05-14 DIAGNOSIS — D573 Sickle-cell trait: Secondary | ICD-10-CM | POA: Diagnosis not present

## 2020-05-14 DIAGNOSIS — D561 Beta thalassemia: Secondary | ICD-10-CM | POA: Diagnosis not present

## 2020-05-15 ENCOUNTER — Telehealth: Payer: Medicare Other

## 2020-05-16 ENCOUNTER — Telehealth: Payer: Medicare Other

## 2020-05-20 DIAGNOSIS — D696 Thrombocytopenia, unspecified: Secondary | ICD-10-CM | POA: Diagnosis not present

## 2020-05-20 DIAGNOSIS — G9341 Metabolic encephalopathy: Secondary | ICD-10-CM | POA: Diagnosis not present

## 2020-05-20 DIAGNOSIS — E119 Type 2 diabetes mellitus without complications: Secondary | ICD-10-CM | POA: Diagnosis not present

## 2020-05-20 DIAGNOSIS — N184 Chronic kidney disease, stage 4 (severe): Secondary | ICD-10-CM | POA: Diagnosis not present

## 2020-06-07 ENCOUNTER — Other Ambulatory Visit: Payer: Self-pay

## 2020-06-07 ENCOUNTER — Encounter (HOSPITAL_COMMUNITY)
Admission: RE | Admit: 2020-06-07 | Discharge: 2020-06-07 | Disposition: A | Discharge status: Home / Self Care | Payer: Medicare Other | Source: Ambulatory Visit | Attending: Nephrology | Admitting: Nephrology

## 2020-06-07 VITALS — BP 132/73 | HR 69 | Temp 97.2°F | Resp 20

## 2020-06-07 DIAGNOSIS — E1122 Type 2 diabetes mellitus with diabetic chronic kidney disease: Secondary | ICD-10-CM

## 2020-06-07 DIAGNOSIS — N184 Chronic kidney disease, stage 4 (severe): Secondary | ICD-10-CM | POA: Diagnosis not present

## 2020-06-07 LAB — IRON AND TIBC
Iron: 86 ug/dL (ref 28–170)
Saturation Ratios: 38 % — ABNORMAL HIGH (ref 10.4–31.8)
TIBC: 225 ug/dL — ABNORMAL LOW (ref 250–450)
UIBC: 139 ug/dL

## 2020-06-07 LAB — POCT HEMOGLOBIN-HEMACUE: Hemoglobin: 10.1 g/dL — ABNORMAL LOW (ref 12.0–15.0)

## 2020-06-07 LAB — FERRITIN: Ferritin: 224 ng/mL (ref 11–307)

## 2020-06-07 MED ORDER — EPOETIN ALFA 10000 UNIT/ML IJ SOLN: 10000.0000 [IU] | INTRAMUSCULAR | Status: DC

## 2020-06-07 MED ORDER — EPOETIN ALFA-EPBX 10000 UNIT/ML IJ SOLN
INTRAMUSCULAR | Status: AC
  Administered 2020-06-07: 10000 [IU]
  Filled 2020-06-07: qty 1

## 2020-06-11 ENCOUNTER — Encounter: Payer: Self-pay | Admitting: Internal Medicine

## 2020-06-11 ENCOUNTER — Other Ambulatory Visit: Payer: Self-pay

## 2020-06-11 ENCOUNTER — Ambulatory Visit (INDEPENDENT_AMBULATORY_CARE_PROVIDER_SITE_OTHER): Payer: Medicare Other | Admitting: Internal Medicine

## 2020-06-11 VITALS — BP 110/60 | HR 77 | Temp 98.1°F | Ht 64.3 in | Wt 161.2 lb

## 2020-06-11 DIAGNOSIS — I700 Atherosclerosis of aorta: Secondary | ICD-10-CM

## 2020-06-11 DIAGNOSIS — Z23 Encounter for immunization: Secondary | ICD-10-CM

## 2020-06-11 DIAGNOSIS — M79641 Pain in right hand: Secondary | ICD-10-CM | POA: Diagnosis not present

## 2020-06-11 DIAGNOSIS — I129 Hypertensive chronic kidney disease with stage 1 through stage 4 chronic kidney disease, or unspecified chronic kidney disease: Secondary | ICD-10-CM

## 2020-06-11 DIAGNOSIS — R269 Unspecified abnormalities of gait and mobility: Secondary | ICD-10-CM

## 2020-06-11 DIAGNOSIS — N184 Chronic kidney disease, stage 4 (severe): Secondary | ICD-10-CM

## 2020-06-11 DIAGNOSIS — I2510 Atherosclerotic heart disease of native coronary artery without angina pectoris: Secondary | ICD-10-CM | POA: Diagnosis not present

## 2020-06-11 DIAGNOSIS — E663 Overweight: Secondary | ICD-10-CM

## 2020-06-11 DIAGNOSIS — Z6827 Body mass index (BMI) 27.0-27.9, adult: Secondary | ICD-10-CM

## 2020-06-11 DIAGNOSIS — E1122 Type 2 diabetes mellitus with diabetic chronic kidney disease: Secondary | ICD-10-CM | POA: Diagnosis not present

## 2020-06-11 NOTE — Progress Notes (Signed)
I,Miranda Roman,acting as a Education administrator for Miranda Greenland, MD.,have documented all relevant documentation on the behalf of Miranda Greenland, MD,as directed by  Miranda Greenland, MD while in the presence of Miranda Greenland, MD.  This visit occurred during the SARS-CoV-2 public health emergency.  Safety protocols were in place, including screening questions prior to the visit, additional usage of staff PPE, and extensive cleaning of exam room while observing appropriate contact time as indicated for disinfecting solutions.  Subjective:     Patient ID: Miranda Roman , female    DOB: Sep 03, 1945 , 74 y.o.   MRN: [id number]   Chief Complaint  Patient presents with  . Diabetes  . Hypertension    HPI  Patient is here today for diabetes check. She states that she is compliant with medications. Her daughter is here with her today.   Diabetes She presents for her follow-up diabetic visit. She has type 2 diabetes mellitus. Her disease course has been stable. There are no hypoglycemic associated symptoms. There are no diabetic associated symptoms. Pertinent negatives for diabetes include no blurred vision. There are no hypoglycemic complications. Diabetic complications include nephropathy. Risk factors for coronary artery disease include diabetes mellitus, dyslipidemia, hypertension, sedentary lifestyle and post-menopausal. Current diabetic treatment includes insulin injections. She is compliant with treatment most of the time. She is following a diabetic diet. Meal planning includes avoidance of concentrated sweets.  Hypertension This is a chronic problem. The current episode started more than 1 year ago. The problem has been gradually improving since onset. Pertinent negatives include no blurred vision or palpitations. The current treatment provides moderate improvement.     Past Medical History:  Diagnosis Date  . Anemia   . Diabetes mellitus   . Hyperlipidemia   . Hypertension   . Osteoporosis    . Sickle cell trait ([place])   . 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 Diabetes Mother   . Heart disease Mother   . Diabetes Father   . Heart disease Father   . Diabetes Sister   . Diabetes Maternal Aunt   . Diabetes Maternal Uncle   . Diabetes Maternal Grandmother   . Diabetes Maternal Grandfather      Current Outpatient Medications:  .  acetaminophen (TYLENOL) 500 MG tablet, Take 1 tablet (500 mg total) by mouth every 6 (six) hours as needed for moderate pain or fever., Disp: 30 tablet, Rfl: 0 .  amLODipine (NORVASC) 10 MG tablet, Take 1 tablet (10 mg total) by mouth daily., Disp: , Rfl:  .  Continuous Blood Gluc Receiver (FREESTYLE LIBRE 14 DAY READER) DEVI, Use as directed to check blood sugars 4 times per day dx: e11.65, Disp: 1 each, Rfl: 3 .  Continuous Blood Gluc Sensor (FREESTYLE LIBRE 14 DAY SENSOR) MISC, Use as directed to check blood sugars 4 times per day dx: e11.65, Disp: 2 each, Rfl: 3 .  diclofenac Sodium (VOLTAREN) 1 % GEL, Apply 2 g topically 4 (four) times daily. Apply to left knee., Disp: 50 g, Rfl: 0 .  gabapentin (NEURONTIN) 100 MG capsule, One capsule po qhs (Patient not taking: Reported on 06/13/2020), Disp: 30 capsule, Rfl: 2 .  insulin detemir (LEVEMIR) 100 UNIT/ML injection, Inject 0.12 mLs (12 Units total) into the skin 2 (two) times daily. (Patient taking differently: Inject 12 Units into the skin 2 (two) times daily. 6units in the morning), Disp: 10 mL, Rfl: 11 .  Insulin  Lispro (HUMALOG [place]), Inject into the skin., Disp: , Rfl:  .  Insulin Pen Needle (BD PEN NEEDLE NANO U/F) 32G X 4 MM MISC, Use as directed with insulin pen, Disp: 150 each, Rfl: 2 .  metoprolol tartrate (LOPRESSOR) 100 MG tablet, Take 1 tablet (100 mg total) by mouth 2 (two) times daily. (Patient taking differently: Take 100 mg by mouth 2 (two) times daily. Break tab in half. Pt. Takes 50 mg two times a day. 100 mg total.), Disp: 90 tablet, Rfl: 1 .   thiamine 100 MG tablet, Take 1 tablet (100 mg total) by mouth daily., Disp: , Rfl:  .  GVOKE HYPOPEN 2-PACK 1 MG/0.2ML SOAJ, Inject 1 mg into the skin as needed., Disp: 0.4 mL, Rfl: 2   Allergies  Allergen Reactions  . Cefepime Other (See Comments)    Causes encephalitis - reported by [facility] 12/31/2019  . Lactose Intolerance (Gi)     Per pt's daughter causes gas     Review of Systems  Constitutional: Negative.   Eyes: Negative for blurred vision.  Respiratory: Negative.   Cardiovascular: Negative.  Negative for palpitations.  Gastrointestinal: Negative.   Musculoskeletal: Positive for arthralgias.  Neurological: Negative.   Psychiatric/Behavioral: Negative.      Today's Vitals   06/11/20 1413  BP: 110/60  Pulse: 77  Temp: 98.1 F (36.7 C)  TempSrc: Oral  Weight: 161 lb 3.2 oz (73.1 kg)  Height: 5' 4.3" (1.633 m)   Body mass index is 27.41 kg/m.  Wt Readings from Last 3 Encounters:  06/13/20 159 lb 12.8 oz (72.5 kg)  06/11/20 161 lb 3.2 oz (73.1 kg)  05/10/20 154 lb 12.8 oz (70.2 kg)    Objective:  Physical Exam Vitals and nursing note reviewed.  Constitutional:      Appearance: Normal appearance.  HENT:     Head: Normocephalic and atraumatic.  Cardiovascular:     Rate and Rhythm: Normal rate and regular rhythm.     Heart sounds: Normal heart sounds.  Pulmonary:     Effort: Pulmonary effort is normal.     Breath sounds: Normal breath sounds.  Musculoskeletal:     Comments: Pos squeeze test R hand  Skin:    General: Skin is warm.  Neurological:     General: No focal deficit present.     Mental Status: She is alert.  Psychiatric:        Mood and Affect: Mood normal.        Behavior: Behavior normal.         Assessment And Plan:     1. 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   2. Hypertensive nephropathy Comments: Chronic, well controlled. She will continue  with current meds.   3. Atherosclerosis of native coronary artery of native heart without angina pectoris Comments: Chronic, encouraged to follow heart healthy lifestyle. She is not on statin therapy at this time due to recent encephalopathy. Will consider restarting at a future visit.   4. Atherosclerosis of aorta (H[facility]) Comments: Chronic, please see #3.   5. Right hand pain Comments: I will check arthritis panel. I will make further recommendations once her labs are available for review.  - ANA, IFA (with reflex) - CYCLIC CITRUL PEPTIDE ANTIBODY, IGG/IGA - Rheumatoid factor - Sedimentation rate - Uric acid  6. Abnormality of gait Comments: She would like referral to [facility]- physical therapy. Her family feels her  mentation has improved such that she will benefit from PT. I agree and referral was placed. - Ambulatory referral to Home Health  7. Overweight with body mass index (BMI) of 27 to 27.9 in adult Comments: Her weight is acceptable for her demographic.   8. Need for influenza vaccination Comments: She was given high dose flu vaccine.  - Flu Vaccine QUAD High Dose(Fluad)  Patient was given opportunity to ask questions. Patient verbalized understanding of the plan and was able to repeat key elements of the plan. All questions were answered to their satisfaction.  Miranda Greenland, MD   I, Miranda Greenland, MD, have reviewed all documentation for this visit. The documentation on 06/23/20 for the exam, diagnosis, procedures, and orders are all accurate and complete.  THE PATIENT IS ENCOURAGED TO PRACTICE SOCIAL DISTANCING DUE TO THE COVID-19 PANDEMIC.

## 2020-06-11 NOTE — Patient Instructions (Signed)
Diabetes Mellitus and Exercise Exercising regularly is important for your overall health, especially when you have diabetes (diabetes mellitus). Exercising is not only about losing weight. It has many other health benefits, such as increasing muscle strength and bone density and reducing body fat and stress. This leads to improved fitness, flexibility, and endurance, all of which result in better overall health. Exercise has additional benefits for people with diabetes, including:  Reducing appetite.  Helping to lower and control blood glucose.  Lowering blood pressure.  Helping to control amounts of fatty substances (lipids) in the blood, such as cholesterol and triglycerides.  Helping the body to respond better to insulin (improving insulin sensitivity).  Reducing how much insulin the body needs.  Decreasing the risk for heart disease by: ? Lowering cholesterol and triglyceride levels. ? Increasing the levels of good cholesterol. ? Lowering blood glucose levels. What is my activity plan? Your health care provider or certified diabetes educator can help you make a plan for the type and frequency of exercise (activity plan) that works for you. Make sure that you:  Do at least 150 minutes of moderate-intensity or vigorous-intensity exercise each week. This could be brisk walking, biking, or water aerobics. ? Do stretching and strength exercises, such as yoga or weightlifting, at least 2 times a week. ? Spread out your activity over at least 3 days of the week.  Get some form of physical activity every day. ? Do not go more than 2 days in a row without some kind of physical activity. ? Avoid being inactive for more than 30 minutes at a time. Take frequent breaks to walk or stretch.  Choose a type of exercise or activity that you enjoy, and set realistic goals.  Start slowly, and gradually increase the intensity of your exercise over time. What do I need to know about managing my  diabetes?   Check your blood glucose before and after exercising. ? If your blood glucose is 240 mg/dL (13.3 mmol/L) or higher before you exercise, check your urine for ketones. If you have ketones in your urine, do not exercise until your blood glucose returns to normal. ? If your blood glucose is 100 mg/dL (5.6 mmol/L) or lower, eat a snack containing 15-20 grams of carbohydrate. Check your blood glucose 15 minutes after the snack to make sure that your level is above 100 mg/dL (5.6 mmol/L) before you start your exercise.  Know the symptoms of low blood glucose (hypoglycemia) and how to treat it. Your risk for hypoglycemia increases during and after exercise. Common symptoms of hypoglycemia can include: ? Hunger. ? Anxiety. ? Sweating and feeling clammy. ? Confusion. ? Dizziness or feeling light-headed. ? Increased heart rate or palpitations. ? Blurry vision. ? Tingling or numbness around the mouth, lips, or tongue. ? Tremors or shakes. ? Irritability.  Keep a rapid-acting carbohydrate snack available before, during, and after exercise to help prevent or treat hypoglycemia.  Avoid injecting insulin into areas of the body that are going to be exercised. For example, avoid injecting insulin into: ? The arms, when playing tennis. ? The legs, when jogging.  Keep records of your exercise habits. Doing this can help you and your health care provider adjust your diabetes management plan as needed. Write down: ? Food that you eat before and after you exercise. ? Blood glucose levels before and after you exercise. ? The type and amount of exercise you have done. ? When your insulin is expected to peak, if you use   insulin. Avoid exercising at times when your insulin is peaking.  When you start a new exercise or activity, work with your health care provider to make sure the activity is safe for you, and to adjust your insulin, medicines, or food intake as needed.  Drink plenty of water while  you exercise to prevent dehydration or heat stroke. Drink enough fluid to keep your urine clear or pale yellow. Summary  Exercising regularly is important for your overall health, especially when you have diabetes (diabetes mellitus).  Exercising has many health benefits, such as increasing muscle strength and bone density and reducing body fat and stress.  Your health care provider or certified diabetes educator can help you make a plan for the type and frequency of exercise (activity plan) that works for you.  When you start a new exercise or activity, work with your health care provider to make sure the activity is safe for you, and to adjust your insulin, medicines, or food intake as needed. This information is not intended to replace advice given to you by your health care provider. Make sure you discuss any questions you have with your health care provider. Document Revised: 01/15/2017 Document Reviewed: 12/03/2015 Elsevier Patient Education  2020 Elsevier Inc.  

## 2020-06-12 ENCOUNTER — Other Ambulatory Visit: Payer: Self-pay

## 2020-06-12 MED ORDER — GVOKE HYPOPEN 2-PACK 1 MG/0.2ML ~~LOC~~ SOAJ: 1.0000 mg | SUBCUTANEOUS | 2 refills | Status: DC | PRN

## 2020-06-12 NOTE — Progress Notes (Signed)
NEUROLOGY CONSULTATION NOTE  Miranda Roman MRN: [id number] DOB: [birth year]  Referring provider: Glendale Chard, MD Primary care provider: Glendale Chard, MD  Reason for consult:  encephalopathy   Subjective:  Miranda Roman is a 74 year old right-handed female with HTN, diabetes, HLD, sickle cell trait, beta thalassemia and history of gastric bypass who presents for encephalopathy.  History supplemented by hospital records.  She is accompanied by her daughter who provides collateral history.   [place] fell into water - got out and okay.  That evening - chest and back pain - ED r/o PE.  Delirium - incoherent.  Improved after 2 days. cefepine in case insurance.  Prednisone - TTP?  Stopped morphine declined.  Poorly responsive - only tactile stimuli not intubated.  Solu-Medrol and 561m thiamine.    [facility].  Earlier that day, she fell into a freshwater pond.  She felt fine but developed chest and back pain that evening.  VQ scan and dopplers were negative and cardiac workup overall unremarkable except for moderate pulmonary hypertension.  During hospitalization, she developed sudden onset of encephalopathy in setting of leukocytosis and fever.  ID and neurology were consulted.  She underwent MRI of brain, EEG and lumbar puncture which were unrevealing.  She had also developed anemia, thrombocytopenia, transaminitis and acute injury.  TTP was considered and she was treated with 5 rounds of plasmapheresis and steroids without improvement. For infection, she was started on cefepime.  She required NG tube.  [facility], long-term EEG and lumbar puncture were negative for infection, autoimmune or paraneoplastic etiologies.  She was treated with 5 day course of IVIg.  Mentation started to improve and NG tube was removed.  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   She required PT/OT after discharge.  Since hospitalization, she reports decreased vision.  She saw Dr. SGershon Craneof ophthalmology in October who found no ocular cause for her vision loss and visual field testing reportedly full.  She reports no improvement in vision since hospitalization.  She now cannot ambulate independently and needs to use a walker.    Workup included: 01/15/2020 MRI BRAIN W WO (personally reviewed):  Mild chronic small vessel disease but otherwise no acute intracranial abnormality.  01/16/2020 CSF:  Cell count 0, protein 49, glucose 124, culture negative, VDRL nonreactive, oligoclonal bands 0, IgG index 0.5, paraneoplastic/autoimmune encephlaopathy panel (amphlphyasin ab, AGNA-1, ANNA-1/2/3, CRMP-5, PCA, NMDA-R ab, CASPR2, DPPX, GABA, CAD65, GFAP, IgLON5, LGl1, mGlucR1, NIF) negative 01/14/2020 SERUM:  Sed rate 35, CRP 0.7, thyroperoxidase Ab negative, B1 403.4, ammonia 26, Hgb A1c 7.1, WBC 9.8, HGB 8.2, HCT 26.8, PLT 276, Na 144, K 3.7, glucose 281, BUN 40, Cr 2.08, t bili 0.5, ALP 72, AST 23, ALT 63, GFR 27      PAST MEDICAL HISTORY: Past Medical History:  Diagnosis Date  . Anemia   . Diabetes mellitus   . Hyperlipidemia   . Hypertension   . Osteoporosis   . Sickle cell trait ([place]   . [place] beta [place]     PAST SURGICAL HISTORY: Past Surgical History:  Procedure Laterality Date  . BONE RESECTION  01/2006   wrist  . 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 TUBAL LIGATION  04/28/1977    MEDICATIONS: Current Outpatient Medications on File Prior to Visit  Medication Sig Dispense Refill  .  acetaminophen (TYLENOL) 500 MG tablet Take 1 tablet (500 mg total) by mouth every 6 (six) hours as needed for moderate pain or fever. 30 tablet 0  . amLODipine (NORVASC) 10 MG tablet Take 1 tablet (10 mg total) by mouth daily.    . Continuous Blood Gluc Receiver (FREESTYLE LIBRE 14 DAY READER) DEVI Use as directed to  check blood sugars 4 times per day dx: e11.65 1 each 3  . Continuous Blood Gluc Sensor (FREESTYLE LIBRE 14 DAY SENSOR) MISC Use as directed to check blood sugars 4 times per day dx: e11.65 2 each 3  . diclofenac Sodium (VOLTAREN) 1 % GEL Apply 2 g topically 4 (four) times daily. Apply to left knee. 50 g 0  . gabapentin (NEURONTIN) 100 MG capsule One capsule po qhs 30 capsule 2  . insulin detemir (LEVEMIR) 100 UNIT/ML injection Inject 0.12 mLs (12 Units total) into the skin 2 (two) times daily. (Patient taking differently: Inject 12 Units into the skin 2 (two) times daily. 6units in the morning) 10 mL 11  . Insulin Lispro (HUMALOG [place]) Inject into the skin.    . Insulin Pen Needle (BD PEN NEEDLE NANO U/F) 32G X 4 MM MISC Use as directed with insulin pen 150 each 2  . metoprolol tartrate (LOPRESSOR) 100 MG tablet Take 1 tablet (100 mg total) by mouth 2 (two) times daily. (Patient taking differently: Take 100 mg by mouth 2 (two) times daily. Break tab in half. Pt. Takes 50 mg two times a day. 100 mg total.) 90 tablet 1  . thiamine 100 MG tablet Take 1 tablet (100 mg total) by mouth daily.     No current facility-administered medications on file prior to visit.    ALLERGIES: Allergies  Allergen Reactions  . Cefepime Other (See Comments)    Causes encephalitis - [facility] by [facility] 12/31/2019  . Lactose Intolerance (Gi)     Per pt's daughter causes gas    FAMILY HISTORY: Family History  Problem Relation Age of Onset  . Diabetes Mother   . Heart disease Mother   . Diabetes Father   . Heart disease Father   . Diabetes Sister   . Diabetes Maternal Aunt   . Diabetes Maternal Uncle   . Diabetes Maternal Grandmother   . Diabetes Maternal Grandfather     SOCIAL HISTORY: Social History   Socioeconomic History  . Marital status: Widowed    Spouse name: Not on file  . Number of children: Not on file  . Years of education: Not on file  . Highest education level: Not on file   Occupational History  . Occupation: retired  Tobacco Use  . Smoking status: Former Smoker    Packs/day: 0.25    Years: 40.00    Pack years: 10.00    Types: Cigarettes    Quit date: 04/2005    Years since quitting: 15.1  . Smokeless tobacco: Never Used  Vaping Use  . Vaping Use: Never used  Substance and Sexual Activity  . Alcohol use: Not Currently    Comment: occasionally  . Drug use: No  . Sexual activity: Not Currently  Other Topics Concern  . Not on file  Social History Narrative  . Not on file   Social Determinants of Health   Financial Resource Strain: Low Risk   . Difficulty of Paying Living Expenses: Not hard at all  Food Insecurity: No Food Insecurity  . Worried About Charity fundraiser in the Last Year: Never  true  . Ran Out of Food in the Last Year: Never true  Transportation Needs: No Transportation Needs  . Lack of Transportation (Medical): No  . Lack of Transportation (Non-Medical): No  Physical Activity: Inactive  . Days of Exercise per Week: 0 days  . Minutes of Exercise per Session: 0 min  Stress: No Stress Concern Present  . Feeling of Stress : Not at all  Social Connections:   . Frequency of Communication with Friends and Family: Not on file  . Frequency of Social Gatherings with Friends and Family: Not on file  . Attends Religious Services: Not on file  . Active Member of Clubs or Organizations: Not on file  . Attends Archivist Meetings: Not on file  . Marital Status: Not on file  Intimate Partner Violence:   . Fear of Current or Ex-Partner: Not on file  . Emotionally Abused: Not on file  . Physically Abused: Not on file  . Sexually Abused: Not on file    Objective:  Blood pressure 136/77, pulse 71, height 5' 3" (1.6 m), weight 159 lb 12.8 oz (72.5 kg), SpO2 100 %. General: No acute distress.  Patient appears well-groomed.   Head:  Normocephalic/atraumatic Eyes:  fundi examined but not visualized Neck: supple, no paraspinal  tenderness, full range of motion Back: No paraspinal tenderness Heart: regular rate and rhythm Lungs: Clear to auscultation bilaterally. Vascular: No carotid bruits. Neurological Exam: Mental status: alert and oriented to person, place, and time, recent and remote memory intact, fund of knowledge intact, attention and concentration intact, speech fluent and not dysarthric, language intact. [employer] Mental Exam 06/13/2020  Weekday Correct 1  Current year 1  What state are we in? 1  Amount spent 1  Amount left 0  # of Animals 1  5 objects recall 5  Number series 2  Hour markers 0  Time correct 2  Placed X in triangle correctly 1  Largest Figure 1  Name of female 2  Date back to work 0  Type of work 2  State she lived in 2  Total score 22   Cranial nerves: CN I: not tested CN II: pupils equal, round and reactive to light, left sided visual field cut OU CN III, IV, VI:  full range of motion but with some saccades on tracking, no nystagmus, no ptosis CN V: facial sensation intact. CN VII: upper and lower face symmetric CN VIII: hearing intact CN IX, X: gag intact, uvula midline CN XI: sternocleidomastoid and trapezius muscles intact CN XII: tongue midline Bulk & Tone: normal, no fasciculations, no rigidity. Motor:  bradykinetic, muscle strength 5-/5 throughout Sensation:  Pinprick, temperature and vibratory sensation intact. Deep Tendon Reflexes:  2+ throughout,  toes downgoing.   Finger to nose testing:  Without dysmetria.   Heel to shin:  Without dysmetria.   Gait:  Wide-based, unsteady gait.  Short stride.  Romberg with sway.  Assessment/Plan:   Acute encephalopathy.  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  No evidence for CNS infection.  At this point, paraneoplastic syndrome  less likely given that acute symptoms resolved and has not had any progression of symptoms or deficits.  She does exhibit visual field cut on my exam, raising possibility of right posterior cerebral involvement (PRES?), however again no abnormalities on brain MRI.  1.  Given the continued visual field deficits, we will repeat MRI of brain to evaluate for any corresponding findings.  As this is not emergent, I will not order with contrast (given her renal insufficiency) 2.  Continue supplemental B1. 3.  Continue use of walker 4.  Will continue to monitor for now 5.  Follow up in 6 months or as needed.  Thank you for allowing me to take part in the care of this patient.  Metta Clines, DO  CC: Glendale Chard, MD

## 2020-06-13 ENCOUNTER — Ambulatory Visit (INDEPENDENT_AMBULATORY_CARE_PROVIDER_SITE_OTHER): Payer: Medicare Other | Admitting: Neurology

## 2020-06-13 ENCOUNTER — Encounter: Payer: Self-pay | Admitting: Neurology

## 2020-06-13 ENCOUNTER — Other Ambulatory Visit: Payer: Self-pay

## 2020-06-13 VITALS — BP 136/77 | HR 71 | Ht 63.0 in | Wt 159.8 lb

## 2020-06-13 DIAGNOSIS — G9340 Encephalopathy, unspecified: Secondary | ICD-10-CM | POA: Diagnosis not present

## 2020-06-13 LAB — ANTINUCLEAR ANTIBODIES, IFA: ANA Titer 1: NEGATIVE

## 2020-06-13 LAB — URIC ACID: Uric Acid: 6.6 mg/dL (ref 3.1–7.9)

## 2020-06-13 LAB — SEDIMENTATION RATE: Sed Rate: 19 mm/hr (ref 0–40)

## 2020-06-13 LAB — RHEUMATOID FACTOR: Rheumatoid fact SerPl-aCnc: 42.1 IU/mL — ABNORMAL HIGH (ref <14.0)

## 2020-06-13 LAB — CYCLIC CITRUL PEPTIDE ANTIBODY, IGG/IGA: Cyclic Citrullin Peptide Ab: 7 units (ref 0–19)

## 2020-06-13 NOTE — Patient Instructions (Signed)
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  Repeat MRI of brain without contrast 2.  Continue thiamine 3.  Follow up 6 months.

## 2020-06-14 ENCOUNTER — Telehealth: Payer: Self-pay

## 2020-06-14 ENCOUNTER — Telehealth: Payer: Medicare Other

## 2020-06-14 DIAGNOSIS — G9340 Encephalopathy, unspecified: Secondary | ICD-10-CM | POA: Diagnosis not present

## 2020-06-14 DIAGNOSIS — Z09 Encounter for follow-up examination after completed treatment for conditions other than malignant neoplasm: Secondary | ICD-10-CM | POA: Diagnosis not present

## 2020-06-14 DIAGNOSIS — E119 Type 2 diabetes mellitus without complications: Secondary | ICD-10-CM | POA: Diagnosis not present

## 2020-06-14 DIAGNOSIS — N189 Chronic kidney disease, unspecified: Secondary | ICD-10-CM | POA: Diagnosis not present

## 2020-06-14 NOTE — Telephone Encounter (Cosign Needed)
  Chronic Care Management   Outreach Note  06/14/2020 Name: Miranda Roman MRN: [id number] DOB: [birth year]  Referred by: Glendale Chard, MD Reason for referral : Chronic Care Management (Initial RN CM Call Attempt)   Miranda Roman is enrolled in a Managed Medicaid Health Plan: No  An unsuccessful telephone outreach was attempted today. The patient was referred to the case management team for assistance with care management and care coordination.   Follow Up Plan: Telephone follow up appointment with care management team member scheduled for: 07/24/20  Barb Merino, RN, BSN, CCM Care Management Coordinator [place] [facility]  Direct Phone: [phone]

## 2020-06-15 ENCOUNTER — Telehealth: Payer: Self-pay

## 2020-06-15 NOTE — Telephone Encounter (Signed)
-----  Message from Glendale Chard, MD sent at 06/14/2020 12:38 PM EST ----- Your rheumatoid factor is elevated, other arthritis panel labs are within normal limits. Does she want to see rheumatologist?

## 2020-06-15 NOTE — Telephone Encounter (Signed)
Left the pt a message to please respond to the question that was in the lab report.

## 2020-06-19 DIAGNOSIS — D696 Thrombocytopenia, unspecified: Secondary | ICD-10-CM | POA: Diagnosis not present

## 2020-06-19 DIAGNOSIS — G9341 Metabolic encephalopathy: Secondary | ICD-10-CM | POA: Diagnosis not present

## 2020-06-19 DIAGNOSIS — E119 Type 2 diabetes mellitus without complications: Secondary | ICD-10-CM | POA: Diagnosis not present

## 2020-06-19 DIAGNOSIS — N184 Chronic kidney disease, stage 4 (severe): Secondary | ICD-10-CM | POA: Diagnosis not present

## 2020-06-22 ENCOUNTER — Telehealth: Payer: Self-pay

## 2020-06-22 ENCOUNTER — Other Ambulatory Visit: Payer: Self-pay

## 2020-06-22 ENCOUNTER — Telehealth: Payer: Medicare Other

## 2020-06-22 DIAGNOSIS — M059 Rheumatoid arthritis with rheumatoid factor, unspecified: Secondary | ICD-10-CM

## 2020-06-22 NOTE — Telephone Encounter (Signed)
  Chronic Care Management   Outreach Note  06/22/2020 Name: MELI FALEY MRN: [id number] DOB: 01-21-1946  Referred by: Glendale Chard, MD Reason for referral : [place] is enrolled in a Managed [place]: No  An unsuccessful telephone outreach was attempted today. The patient was referred to the case management team for assistance with care management and care coordination.   Follow Up Plan: The care management team will reach out to the patient again over the next 30 days.   Daneen Schick, BSW, CDP Social Worker, Certified Dementia Practitioner [place] [facility] [place] [facility] [phone]

## 2020-06-27 ENCOUNTER — Other Ambulatory Visit: Payer: Self-pay

## 2020-06-27 ENCOUNTER — Encounter: Payer: Self-pay | Admitting: Internal Medicine

## 2020-06-27 MED ORDER — BD PEN NEEDLE NANO U/F 32G X 4 MM MISC
2 refills | Status: DC
Start: 2020-06-27 — End: 2022-04-21

## 2020-06-28 ENCOUNTER — Other Ambulatory Visit: Payer: Self-pay

## 2020-06-28 DIAGNOSIS — Z1231 Encounter for screening mammogram for malignant neoplasm of breast: Secondary | ICD-10-CM | POA: Diagnosis not present

## 2020-06-28 LAB — HM PAP SMEAR

## 2020-07-03 ENCOUNTER — Other Ambulatory Visit: Payer: Self-pay

## 2020-07-03 ENCOUNTER — Ambulatory Visit: Payer: Medicare Other | Admitting: Podiatry

## 2020-07-03 ENCOUNTER — Encounter: Payer: Self-pay | Admitting: Internal Medicine

## 2020-07-03 DIAGNOSIS — M79674 Pain in right toe(s): Secondary | ICD-10-CM

## 2020-07-03 DIAGNOSIS — N184 Chronic kidney disease, stage 4 (severe): Secondary | ICD-10-CM

## 2020-07-03 DIAGNOSIS — D561 Beta thalassemia: Secondary | ICD-10-CM

## 2020-07-03 DIAGNOSIS — L84 Corns and callosities: Secondary | ICD-10-CM | POA: Diagnosis not present

## 2020-07-03 DIAGNOSIS — H269 Unspecified cataract: Secondary | ICD-10-CM | POA: Insufficient documentation

## 2020-07-03 DIAGNOSIS — M79675 Pain in left toe(s): Secondary | ICD-10-CM | POA: Diagnosis not present

## 2020-07-03 DIAGNOSIS — B351 Tinea unguium: Secondary | ICD-10-CM | POA: Diagnosis not present

## 2020-07-03 DIAGNOSIS — E1122 Type 2 diabetes mellitus with diabetic chronic kidney disease: Secondary | ICD-10-CM

## 2020-07-03 DIAGNOSIS — Q828 Other specified congenital malformations of skin: Secondary | ICD-10-CM | POA: Diagnosis not present

## 2020-07-03 HISTORY — DX: Unspecified cataract: H26.9

## 2020-07-03 HISTORY — DX: Beta thalassemia: D56.1

## 2020-07-04 ENCOUNTER — Encounter: Payer: Self-pay | Admitting: Internal Medicine

## 2020-07-05 ENCOUNTER — Encounter (HOSPITAL_COMMUNITY)
Admission: RE | Admit: 2020-07-05 | Discharge: 2020-07-05 | Disposition: A | Discharge status: Home / Self Care | Payer: Medicare Other | Source: Ambulatory Visit | Attending: Nephrology | Admitting: Nephrology

## 2020-07-05 ENCOUNTER — Other Ambulatory Visit: Payer: Self-pay

## 2020-07-05 VITALS — BP 125/81 | HR 72 | Temp 96.9°F | Resp 20

## 2020-07-05 DIAGNOSIS — N184 Chronic kidney disease, stage 4 (severe): Secondary | ICD-10-CM

## 2020-07-05 DIAGNOSIS — E1122 Type 2 diabetes mellitus with diabetic chronic kidney disease: Secondary | ICD-10-CM | POA: Diagnosis not present

## 2020-07-05 LAB — POCT HEMOGLOBIN-HEMACUE: Hemoglobin: 10.4 g/dL — ABNORMAL LOW (ref 12.0–15.0)

## 2020-07-05 MED ORDER — EPOETIN ALFA-EPBX 10000 UNIT/ML IJ SOLN
INTRAMUSCULAR | Status: AC
  Administered 2020-07-05: 10000 [IU] via SUBCUTANEOUS
  Filled 2020-07-05: qty 1

## 2020-07-05 MED ORDER — EPOETIN ALFA 10000 UNIT/ML IJ SOLN: 10000.0000 [IU] | INTRAMUSCULAR | Status: DC

## 2020-07-06 ENCOUNTER — Encounter: Payer: Self-pay | Admitting: Podiatry

## 2020-07-06 NOTE — Progress Notes (Signed)
Subjective: Miranda Roman presents today at risk foot care. Pt has h/o NIDDM with chronic kidney disease and painful callus(es) b/l feet and painful mycotic toenails b/l that are difficult to trim. Pain interferes with ambulation. Aggravating factors include wearing enclosed shoe gear. Pain is relieved with periodic professional debridement.   She voices no new pedal concerns on today's visit.  Glendale Chard, MD is patient's PCP. Last visit was: June 11, 2020.  Past Medical History:  Diagnosis Date  . Anemia   . Diabetes mellitus   . Hyperlipidemia   . Hypertension   . Osteoporosis   . Sickle cell trait ([place])   . Thalassemia, beta ([place])      Current Outpatient Medications on File Prior to Visit  Medication Sig Dispense Refill  . acetaminophen (TYLENOL) 500 MG tablet Take 1 tablet (500 mg total) by mouth every 6 (six) hours as needed for moderate pain or fever. 30 tablet 0  . amLODipine (NORVASC) 10 MG tablet Take 1 tablet (10 mg total) by mouth daily.    . Continuous Blood Gluc Receiver (FREESTYLE LIBRE 14 DAY READER) DEVI Use as directed to check blood sugars 4 times per day dx: e11.65 1 each 3  . Continuous Blood Gluc Sensor (FREESTYLE LIBRE 14 DAY SENSOR) MISC Use as directed to check blood sugars 4 times per day dx: e11.65 2 each 3  . diclofenac Sodium (VOLTAREN) 1 % GEL Apply 2 g topically 4 (four) times daily. Apply to left knee. 50 g 0  . gabapentin (NEURONTIN) 100 MG capsule One capsule po qhs (Patient not taking: Reported on 06/13/2020) 30 capsule 2  . GVOKE HYPOPEN 2-PACK 1 MG/0.2ML SOAJ Inject 1 mg into the skin as needed. 0.4 mL 2  . insulin detemir (LEVEMIR) 100 UNIT/ML injection Inject 0.12 mLs (12 Units total) into the skin 2 (two) times daily. (Patient taking differently: Inject 12 Units into the skin 2 (two) times daily. 6units in the morning) 10 mL 11  . Insulin Lispro (HUMALOG [place]) Inject into the skin.    . Insulin Pen Needle (BD PEN NEEDLE NANO U/F) 32G X 4 MM  MISC Use as directed with insulin pen 150 each 2  . metoprolol tartrate (LOPRESSOR) 100 MG tablet Take 1 tablet (100 mg total) by mouth 2 (two) times daily. (Patient taking differently: Take 100 mg by mouth 2 (two) times daily. Break tab in half. Pt. Takes 50 mg two times a day. 100 mg total.) 90 tablet 1  . thiamine 100 MG tablet Take 1 tablet (100 mg total) by mouth daily.     No current facility-administered medications on file prior to visit.     Allergies  Allergen Reactions  . Cefepime Other (See Comments)    Causes encephalitis - reported by [facility] 12/31/2019  . Lactose Intolerance (Gi)     Per pt's daughter causes gas    Objective: Miranda Roman is a pleasant 74 y.o. AAF in NAD. AAO x 3.   There were no vitals filed for this visit.  Vascular Examination: Neurovascular status unchanged b/l. Capillary refill time to digits immediate b/l. Palpable DP pulses b/l. Palpable PT pulses b/l. Pedal hair absent b/l Skin temperature gradient within normal limits b/l.  Dermatological Examination: Pedal skin with normal turgor, texture and tone bilaterally. No open wounds bilaterally. No interdigital macerations bilaterally. Toenails 1-5 b/l elongated, discolored, dystrophic, thickened, crumbly with subungual debris and tenderness to dorsal palpation. Hyperkeratotic lesion(s) L 3rd toe, submet head 3 left foot,  submet head 5 left foot and submet head 5 right foot.  No erythema, no edema, no drainage, no flocculence.   Flesh colored elevated lesion noted dorsum of left foot, stable and unchanged. No symptoms such as pain, bleeding. No asymmetry, borders normal, color normal, no underlying nodularity.  Musculoskeletal: Normal muscle strength 5/5 to all lower extremity muscle groups bilaterally. No pain crepitus or joint limitation noted with ROM b/l. Tailor's bunion deformity noted b/l.  Neurological Examination: Protective sensation intact 5/5 intact bilaterally with 10g  monofilament b/l. Proprioception intact bilaterally.   Hemoglobin A1C Latest Ref Rng & Units 04/09/2020 01/14/2020 09/28/2019  HGBA1C 4.8 - 5.6 % 5.2 7.1(H) 6.1(H)  Some recent data might be hidden   Assessment: 1. Pain due to onychomycosis of toenails of both feet   2. Porokeratosis   3. Corns and callosities   4. Diabetes mellitus with stage 4 chronic kidney disease ([place])     Plan: -Examined patient. -No new findings. No new orders. -Continue diabetic foot care principles. Literature dispensed on today.  -Toenails 1-5 b/l were debrided in length and girth with sterile nail nippers and dremel without iatrogenic bleeding.  -Callus(es) submet head 3 left foot, submet head 5 left foot and submet head 5 right foot pared utilizing sterile scalpel blade without complication or incident. Total number debrided =3. -Patient to continue soft, supportive shoe gear daily. -Patient to report any pedal injuries to medical professional immediately. -Patient/POA to call should there be question/concern in the interim.  Return in about 3 months (around 10/01/2020).  Marzetta Board, DPM

## 2020-07-10 ENCOUNTER — Other Ambulatory Visit: Payer: Self-pay | Admitting: *Deleted

## 2020-07-10 ENCOUNTER — Encounter: Payer: Self-pay | Admitting: *Deleted

## 2020-07-11 ENCOUNTER — Ambulatory Visit: Payer: Medicare Other | Admitting: Diagnostic Neuroimaging

## 2020-07-16 DIAGNOSIS — U071 COVID-19: Secondary | ICD-10-CM | POA: Diagnosis not present

## 2020-07-16 DIAGNOSIS — Z20822 Contact with and (suspected) exposure to covid-19: Secondary | ICD-10-CM | POA: Diagnosis not present

## 2020-07-19 DIAGNOSIS — Z20822 Contact with and (suspected) exposure to covid-19: Secondary | ICD-10-CM | POA: Diagnosis not present

## 2020-07-20 DIAGNOSIS — N184 Chronic kidney disease, stage 4 (severe): Secondary | ICD-10-CM | POA: Diagnosis not present

## 2020-07-20 DIAGNOSIS — E119 Type 2 diabetes mellitus without complications: Secondary | ICD-10-CM | POA: Diagnosis not present

## 2020-07-20 DIAGNOSIS — G9341 Metabolic encephalopathy: Secondary | ICD-10-CM | POA: Diagnosis not present

## 2020-07-20 DIAGNOSIS — D696 Thrombocytopenia, unspecified: Secondary | ICD-10-CM | POA: Diagnosis not present

## 2020-07-23 ENCOUNTER — Telehealth: Payer: Medicare Other

## 2020-07-24 ENCOUNTER — Telehealth: Payer: Medicare Other

## 2020-07-24 ENCOUNTER — Telehealth: Payer: Self-pay

## 2020-07-24 NOTE — Telephone Encounter (Cosign Needed)
  Chronic Care Management   Outreach Note  07/24/2020 Name: Miranda Roman MRN: [id number] DOB: 10-09-1945  Referred by: Glendale Chard, MD Reason for referral : Chronic Care Management (#2 Initial CCM RN CM Call Attempt)   A second unsuccessful telephone outreach was attempted today. The patient was referred to the case management team for assistance with care management and care coordination.   Follow Up Plan: A HIPAA compliant phone message was left for the patient providing contact information and requesting a return call. Telephone follow up appointment with care management team member scheduled for: 08/28/20  Barb Merino, RN, BSN, CCM Care Management Coordinator [place] [facility]  Direct Phone: [phone]

## 2020-07-31 ENCOUNTER — Telehealth: Payer: Self-pay

## 2020-07-31 ENCOUNTER — Telehealth: Payer: Medicare Other

## 2020-07-31 ENCOUNTER — Encounter: Payer: Self-pay | Admitting: Internal Medicine

## 2020-07-31 NOTE — Telephone Encounter (Signed)
  Chronic Care Management   Outreach Note  07/31/2020 Name: Miranda Roman MRN: [id number] DOB: [birth year]  Referred by: Glendale Chard, MD Reason for referral : Care Coordination   A second unsuccessful telephone outreach was attempted today. The patient was referred to the case management team for assistance with care management and care coordination.   Follow Up Plan: A HIPAA compliant phone message was left for the patient providing contact information and requesting a return call.  The care management team will reach out to the patient again over the next 21 days.   Daneen Schick, BSW, CDP Social Worker, Certified Dementia Practitioner [place] [facility] [place] [facility] [phone]

## 2020-08-01 ENCOUNTER — Other Ambulatory Visit: Payer: Self-pay

## 2020-08-01 MED ORDER — HUMALOG 100 UNIT/ML ~~LOC~~ SOLN: SUBCUTANEOUS | 1 refills | Status: DC

## 2020-08-01 MED ORDER — INSULIN DETEMIR 100 UNIT/ML ~~LOC~~ SOLN: 12.0000 [IU] | Freq: Two times a day (BID) | SUBCUTANEOUS | 1 refills | Status: DC

## 2020-08-01 MED ORDER — METOPROLOL TARTRATE 100 MG PO TABS: ORAL_TABLET | ORAL | 1 refills | Status: DC

## 2020-08-02 ENCOUNTER — Other Ambulatory Visit: Payer: Self-pay

## 2020-08-02 ENCOUNTER — Ambulatory Visit (HOSPITAL_COMMUNITY)
Admission: RE | Admit: 2020-08-02 | Discharge: 2020-08-02 | Disposition: A | Discharge status: Home / Self Care | Payer: Medicare Other | Source: Ambulatory Visit | Attending: Nephrology | Admitting: Nephrology

## 2020-08-02 VITALS — BP 124/65 | Temp 96.4°F

## 2020-08-02 DIAGNOSIS — N184 Chronic kidney disease, stage 4 (severe): Secondary | ICD-10-CM | POA: Diagnosis not present

## 2020-08-02 DIAGNOSIS — E1122 Type 2 diabetes mellitus with diabetic chronic kidney disease: Secondary | ICD-10-CM | POA: Insufficient documentation

## 2020-08-02 LAB — IRON AND TIBC
Iron: 78 ug/dL (ref 28–170)
Saturation Ratios: 33 % — ABNORMAL HIGH (ref 10.4–31.8)
TIBC: 239 ug/dL — ABNORMAL LOW (ref 250–450)
UIBC: 161 ug/dL

## 2020-08-02 LAB — POCT HEMOGLOBIN-HEMACUE: Hemoglobin: 10.9 g/dL — ABNORMAL LOW (ref 12.0–15.0)

## 2020-08-02 LAB — FERRITIN: Ferritin: 217 ng/mL (ref 11–307)

## 2020-08-02 MED ORDER — EPOETIN ALFA 10000 UNIT/ML IJ SOLN: 10000.0000 [IU] | INTRAMUSCULAR | Status: DC

## 2020-08-02 MED ORDER — EPOETIN ALFA-EPBX 10000 UNIT/ML IJ SOLN
INTRAMUSCULAR | Status: AC
  Administered 2020-08-02: 10000 [IU]
  Filled 2020-08-02: qty 1

## 2020-08-15 ENCOUNTER — Telehealth: Payer: Self-pay

## 2020-08-15 ENCOUNTER — Other Ambulatory Visit: Payer: Self-pay | Admitting: Internal Medicine

## 2020-08-15 NOTE — Telephone Encounter (Signed)
Voicemail full.  I returned the pt's daughter Asencion Gowda call.  Dr. Baird Cancer wants to know who diagnosed the pt with thrush and what has the pt been taking for it.

## 2020-08-17 DIAGNOSIS — I10 Essential (primary) hypertension: Secondary | ICD-10-CM | POA: Diagnosis not present

## 2020-08-17 DIAGNOSIS — D519 Vitamin B12 deficiency anemia, unspecified: Secondary | ICD-10-CM | POA: Diagnosis not present

## 2020-08-20 DIAGNOSIS — D696 Thrombocytopenia, unspecified: Secondary | ICD-10-CM | POA: Diagnosis not present

## 2020-08-20 DIAGNOSIS — E119 Type 2 diabetes mellitus without complications: Secondary | ICD-10-CM | POA: Diagnosis not present

## 2020-08-20 DIAGNOSIS — N184 Chronic kidney disease, stage 4 (severe): Secondary | ICD-10-CM | POA: Diagnosis not present

## 2020-08-20 DIAGNOSIS — G9341 Metabolic encephalopathy: Secondary | ICD-10-CM | POA: Diagnosis not present

## 2020-08-21 ENCOUNTER — Ambulatory Visit: Payer: Self-pay

## 2020-08-21 ENCOUNTER — Telehealth: Payer: Medicare Other

## 2020-08-21 DIAGNOSIS — I129 Hypertensive chronic kidney disease with stage 1 through stage 4 chronic kidney disease, or unspecified chronic kidney disease: Secondary | ICD-10-CM

## 2020-08-21 DIAGNOSIS — E1122 Type 2 diabetes mellitus with diabetic chronic kidney disease: Secondary | ICD-10-CM

## 2020-08-21 DIAGNOSIS — N184 Chronic kidney disease, stage 4 (severe): Secondary | ICD-10-CM

## 2020-08-21 NOTE — Chronic Care Management (AMB) (Signed)
  Chronic Care Management   Outreach Note  08/21/2020 Name: Miranda Roman MRN: [id number] DOB: 10-23-1945  Referred by: Glendale Chard, MD Reason for referral : Care Coordination   Third unsuccessful telephone outreach was attempted today. The patient was referred to the case management team for assistance with care management and care coordination. The patient's primary care provider has been notified of our unsuccessful attempts to make or maintain contact with the patient. The care management team is pleased to engage with this patient at any time in the future should he/she be interested in assistance from the care management team.     Follow Up Plan: No SW follow up planned at this time due to an inability to maintain patient contact.  Daneen Schick, BSW, CDP Social Worker, Certified Dementia Practitioner [place] [facility] [place] [facility] [phone]

## 2020-08-22 DIAGNOSIS — N281 Cyst of kidney, acquired: Secondary | ICD-10-CM | POA: Diagnosis not present

## 2020-08-22 DIAGNOSIS — I129 Hypertensive chronic kidney disease with stage 1 through stage 4 chronic kidney disease, or unspecified chronic kidney disease: Secondary | ICD-10-CM | POA: Diagnosis not present

## 2020-08-22 DIAGNOSIS — D631 Anemia in chronic kidney disease: Secondary | ICD-10-CM | POA: Diagnosis not present

## 2020-08-22 DIAGNOSIS — N184 Chronic kidney disease, stage 4 (severe): Secondary | ICD-10-CM | POA: Diagnosis not present

## 2020-08-22 DIAGNOSIS — E1122 Type 2 diabetes mellitus with diabetic chronic kidney disease: Secondary | ICD-10-CM | POA: Diagnosis not present

## 2020-08-22 DIAGNOSIS — N2581 Secondary hyperparathyroidism of renal origin: Secondary | ICD-10-CM | POA: Diagnosis not present

## 2020-08-23 ENCOUNTER — Other Ambulatory Visit: Payer: Self-pay | Admitting: Nephrology

## 2020-08-23 DIAGNOSIS — N184 Chronic kidney disease, stage 4 (severe): Secondary | ICD-10-CM

## 2020-08-23 DIAGNOSIS — I10 Essential (primary) hypertension: Secondary | ICD-10-CM | POA: Diagnosis not present

## 2020-08-23 DIAGNOSIS — Z794 Long term (current) use of insulin: Secondary | ICD-10-CM | POA: Diagnosis not present

## 2020-08-23 DIAGNOSIS — E119 Type 2 diabetes mellitus without complications: Secondary | ICD-10-CM | POA: Diagnosis not present

## 2020-08-28 ENCOUNTER — Telehealth: Payer: Self-pay

## 2020-08-28 ENCOUNTER — Telehealth: Payer: Medicare Other

## 2020-08-28 NOTE — Telephone Encounter (Cosign Needed)
  Chronic Care Management   Outreach Note  08/28/2020 Name: Miranda Roman MRN: [id number] DOB: 19-Feb-1946  Referred by: Glendale Chard, MD Reason for referral : Chronic Care Management (RN CM FU Call - Case Closure )   Third unsuccessful telephone outreach was attempted today. The patient was referred to the case management team for assistance with care management and care coordination. The patient's primary care provider has been notified of our unsuccessful attempts to make or maintain contact with the patient. The care management team is pleased to engage with this patient at any time in the future should he/she be interested in assistance from the care management team.   Follow Up Plan: We have been unable to make contact with the patient for follow up. The care management team is available to follow up with the patient after provider conversation with the patient regarding recommendation for care management engagement and subsequent re-referral to the care management team.   Barb Merino, RN, BSN, CCM Care Management Coordinator [facility]  Direct Phone: [phone]

## 2020-08-30 ENCOUNTER — Other Ambulatory Visit: Payer: Self-pay

## 2020-08-30 ENCOUNTER — Ambulatory Visit (HOSPITAL_COMMUNITY)
Admission: RE | Admit: 2020-08-30 | Discharge: 2020-08-30 | Disposition: A | Discharge status: Home / Self Care | Payer: Medicare Other | Source: Ambulatory Visit | Attending: Nephrology | Admitting: Nephrology

## 2020-08-30 VITALS — BP 135/65 | HR 64 | Temp 96.6°F | Resp 20

## 2020-08-30 DIAGNOSIS — N184 Chronic kidney disease, stage 4 (severe): Secondary | ICD-10-CM | POA: Insufficient documentation

## 2020-08-30 DIAGNOSIS — E1122 Type 2 diabetes mellitus with diabetic chronic kidney disease: Secondary | ICD-10-CM | POA: Diagnosis not present

## 2020-08-30 LAB — FERRITIN: Ferritin: 230 ng/mL (ref 11–307)

## 2020-08-30 LAB — POCT HEMOGLOBIN-HEMACUE: Hemoglobin: 10.7 g/dL — ABNORMAL LOW (ref 12.0–15.0)

## 2020-08-30 LAB — IRON AND TIBC
Iron: 82 ug/dL (ref 28–170)
Saturation Ratios: 33 % — ABNORMAL HIGH (ref 10.4–31.8)
TIBC: 246 ug/dL — ABNORMAL LOW (ref 250–450)
UIBC: 164 ug/dL

## 2020-08-30 MED ORDER — EPOETIN ALFA-EPBX 10000 UNIT/ML IJ SOLN
10000.0000 [IU] | INTRAMUSCULAR | Status: DC
  Administered 2020-08-30: 10000 [IU] via SUBCUTANEOUS

## 2020-08-30 MED ORDER — EPOETIN ALFA-EPBX 10000 UNIT/ML IJ SOLN
INTRAMUSCULAR | Status: AC
  Filled 2020-08-30: qty 1

## 2020-08-30 MED ORDER — EPOETIN ALFA 10000 UNIT/ML IJ SOLN: 10000.0000 [IU] | INTRAMUSCULAR | Status: DC

## 2020-08-31 NOTE — Progress Notes (Signed)
Office Visit Note  Patient: Miranda Roman             Date of Birth: September 13, 1945           MRN: [id number]             PCP: Glendale Chard, MD Referring: Glendale Chard, MD Visit Date: 09/14/2020 Occupation: _0 @  Subjective:  Pain in multiple joints   History of Present Illness: Miranda Roman is a 75 y.o. female in consultation per request of Dr. Baird Cancer.  She is accompanied by her daughter Tedra Coupe.  According to her daughter her symptoms a started in June 2021 while she was living in [place].  She was hospitalized with chest pain and lower back pain and she became delirious while she was in the hospital.  She later became comatose.  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  She did not have much improvement she was also given thiamine and IV steroids.  She was transferred from [place] to [facility] where she had IVIG for 5 days and improvement in her symptoms was noted.  She was evaluated by Dr. 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  According to her daughter she was having some right hand swelling, right shoulder pain and left hip pain when these return her in the bed in the hospital.  She went to rehab for a month and had OT, PT, speech therapy.  Now she has been receiving in-home health care.  She still continues to have discomfort in her right hand right shoulder and left hip.  She had few visits with her PCP and due to persistent discomfort labs were obtained which showed positive rheumatoid factor.  She was referred to me for further evaluation.  There is no family history of autoimmune disease.  Activities of Daily Living:  Patient reports morning stiffness for 20 minutes.   Patient Denies nocturnal pain.  Difficulty dressing/grooming: Reports Difficulty climbing stairs: Reports Difficulty getting out of chair: Reports Difficulty using hands for taps, buttons,  cutlery, and/or writing: Reports  Review of Systems  Constitutional: Positive for fatigue. Negative for night sweats, weight gain and weight loss.  HENT: Negative for mouth sores, trouble swallowing, trouble swallowing, mouth dryness and nose dryness.   Eyes: Positive for itching. Negative for pain, redness, visual disturbance and dryness.  Respiratory: Negative for cough, shortness of breath and difficulty breathing.   Cardiovascular: Negative for chest pain, palpitations, hypertension, irregular heartbeat and swelling in legs/feet.  Gastrointestinal: Negative for blood in stool, constipation and diarrhea.  Endocrine: Negative for increased urination.  Genitourinary: Negative for difficulty urinating and vaginal dryness.  Musculoskeletal: Positive for arthralgias, joint pain, myalgias, morning stiffness and myalgias. Negative for joint swelling, muscle weakness and muscle tenderness.  Skin: Negative for color change, rash, hair loss, redness, skin tightness, ulcers and sensitivity to sunlight.  Allergic/Immunologic: Negative for susceptible to infections.  Neurological: Positive for numbness, memory loss and weakness. Negative for dizziness, headaches and night sweats.  Hematological: Negative for bruising/bleeding tendency and swollen glands.  Psychiatric/Behavioral: Negative for depressed mood, confusion and sleep disturbance. The patient is not nervous/anxious.     PMFS History:  Patient Active Problem List   Diagnosis Date Noted  . Cataract 07/03/2020  . Beta thalassemia ([place]) 07/03/2020  . Dysphagia, neurologic   . History of back pain   . Goals of care, counseling/discussion   . Inadequate oral nutritional intake   .  Altered mental status   . Palliative care by specialist   . Pressure injury of skin 01/13/2020  . Acute encephalopathy 01/13/2020  . PAD (peripheral artery disease) ([place]) 06/06/2019  . Pancreatic cyst 06/06/2019  . Class 1 obesity due to excess calories with  serious comorbidity and body mass index (BMI) of 31.0 to 31.9 in adult 06/06/2019  . Cyst of left kidney 03/10/2019  . Lumbago 02/27/2019  . Facet arthritis of lumbar region 02/27/2019  . Abnormal CT of the chest 12/28/2018  . Atrophic vaginitis 12/15/2018  . Atypical squamous cells of undetermined significance on cytologic smear of cervix (ASC-US) 12/15/2018  . Overweight with body mass index (BMI) 25.0-29.9 12/15/2018  . 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 Osteoporosis 12/15/2018  . Unspecified dyspareunia (CODE) 12/15/2018  . 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 Gastroesophageal reflux disease 08/26/2016  . Aortic valve regurgitation 02/11/2016  . Chest pain at rest 01/30/2015  . Atherosclerosis of native coronary artery of native heart without angina pectoris 12/24/2012  . Benign essential HTN 12/24/2012  . Hyperlipidemia 12/24/2012  . 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 03/10/2012  . Diabetes mellitus with stage 4 chronic kidney disease ([place]) 03/10/2012    Past Medical History:  Diagnosis Date  . Anemia   . Diabetes mellitus   . Hyperlipidemia   . Hypertension   . Osteoporosis   . Sickle cell trait ([place])   . Thalassemia, beta ([place])     Family History  Problem Relation Age of Onset  . Diabetes Mother   . Heart disease Mother   . Hypertension Mother   . Stroke Mother   . Diabetes Father   . Heart disease Father   . Diabetes Sister   . Diabetes Brother   . Hypertension Brother   . Sickle cell anemia Brother   . Diabetes Maternal Aunt   . Diabetes Maternal Uncle   . Diabetes Maternal Grandmother   . Diabetes Maternal Grandfather   . Sickle cell anemia Sister   . Sickle cell anemia Sister   . Healthy Son   . Healthy Son   . Healthy Daughter    Past Surgical History:  Procedure Laterality Date  . BONE RESECTION  01/2006   wrist  . 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  OTHER SURGICAL HISTORY     sickle cell retinopathy laser surgery  . TUBAL LIGATION  04/28/1977   Social History   Social History Narrative   Right handed   Lives with Daughter in one story   Immunization History  Administered Date(s) Administered  . Fluad Quad(high Dose 65+) 06/11/2020  . Influenza-Unspecified 03/16/2019  . PFIZER(Purple Top)SARS-COV-2 Vaccination 07/29/2019, 08/19/2019, 05/04/2020  . Pneumococcal Conjugate-13 06/10/2019  . Pneumococcal Polysaccharide-23 07/09/2016     Objective: Vital Signs: BP 137/80 (BP Location: Right Arm, Patient Position: Sitting, Cuff Size: Normal)   Pulse 65   Ht 5' 4.5" (1.638 m)   Wt 158 lb (71.7 kg)   BMI 26.70 kg/m    Physical Exam Vitals and nursing note reviewed.  Constitutional:      Appearance: She is well-developed.  HENT:     Head: Normocephalic and atraumatic.  Eyes:     Conjunctiva/sclera: Conjunctivae normal.  Cardiovascular:     Rate and Rhythm: Normal rate and regular rhythm.     Heart sounds: Normal heart sounds.  Pulmonary:     Effort: Pulmonary effort is normal.     Breath sounds:  Normal breath sounds.  Abdominal:     General: Bowel sounds are normal.     Palpations: Abdomen is soft.  Musculoskeletal:     Cervical back: Normal range of motion.  Lymphadenopathy:     Cervical: No cervical adenopathy.  Skin:    General: Skin is warm and dry.     Capillary Refill: Capillary refill takes less than 2 seconds.  Neurological:     Mental Status: She is alert and oriented to person, place, and time.  Psychiatric:        Behavior: Behavior normal.      Musculoskeletal Exam: She had limited range of motion of her cervical spine.  Right shoulder joint abduction was about 110 degrees with discomfort.  She had decreased internal rotation.  Left shoulder joint was in full range of motion.  Elbow joints and wrist joints with good range of motion.  She has some PIP thickening but no synovitis was noted.  Hip joints  were in full range of motion.  She had tenderness over left trochanteric bursa.  Knee joints and ankles with good range of motion.  There was no synovitis over ankles or MTPs.  CDAI Exam: CDAI Score: - Patient Global: -; Provider Global: - Swollen: -; Tender: - Joint Exam 09/14/2020   No joint exam has been documented for this visit   There is currently no information documented on the homunculus. Go to the Rheumatology activity and complete the homunculus joint exam.  Investigation: No additional findings.  Imaging: US RENAL  Result Date: 09/06/2020 CLINICAL DATA:  CKD EXAM: RENAL / URINARY TRACT ULTRASOUND COMPLETE COMPARISON:  Abdominal ultrasound 02/09/2020 FINDINGS: Right Kidney: Renal measurements: 9.5 x 4.2 x 5.2 cm = volume: 108 mL. Echogenicity is increased. There is a cyst in the upper pole measuring 1.9 x 1.8 x 2.1 cm. There is a second subcentimeter cyst in the midpole. No hydronephrosis. No solid mass. Left Kidney: Renal measurements: 10.6 x 5.0 x 6.4 cm = volume: 176 mL. Echogenicity is increased. No hydronephrosis. There is a complex cystic mass in the inferior pole measuring 1.9 x 1.6 x 2.0 cm, previously measuring 1.9 x 2.1 x 2.3 cm. No hydronephrosis. Bladder: Appears normal for degree of bladder distention. There is focal irregular wall thickening versus mass in the bladder measuring 2.3 x 1.4 x 1.9 cm. Other: None. IMPRESSION: 1. Bilateral renal cysts. Stable mildly complex in the inferior pole the left kidney measuring 2.0 cm. 2. Increased echogenicity of the bilateral kidneys as can be seen in medical renal disease. 3. There is focal irregular wall thickening versus mass in the bladder measuring 2.3 cm. Recommend direct visualization with cystoscopy. Electronically Signed   By: Audie Pinto M.D.   On: 09/06/2020 18:42    Recent Labs: Lab Results  Component Value Date   WBC 7.8 04/25/2020   HGB 10.7 (L) 08/30/2020   PLT 305 04/25/2020   NA 137 04/25/2020   K 3.7  04/25/2020   CL 106 04/25/2020   CO2 21 (L) 04/25/2020   GLUCOSE 123 (H) 04/25/2020   BUN 24 (H) 04/25/2020   CREATININE 1.91 (H) 04/25/2020   BILITOT 0.4 04/25/2020   ALKPHOS 60 04/25/2020   AST 13 (L) 04/25/2020   ALT 9 04/25/2020   PROT 6.4 (L) 04/25/2020   ALBUMIN 3.0 (L) 04/25/2020   CALCIUM 8.7 (L) 04/25/2020   GFRAA 24 (L) 04/09/2020    Speciality Comments: No specialty comments available.  Procedures:  No procedures performed Allergies: Cefepime and  Lactose intolerance (gi)   Assessment / Plan:     Visit Diagnoses: Rheumatoid factor positive - 06/11/20: RF 42.1, ANA-, anti-CCP 7, ESR 19, uric acid 6.6.  She has positive rheumatoid factor but no synovitis was noted on the examination.  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  She has chronic discomfort in her right shoulder joint.  She would benefit from a cortisone injection but she is diabetic.  I would try physical therapy first.  If she does not have response to physical therapy then cortisone injection can be tried at the follow-up visit.  Plan: XR Shoulder Right x-ray of the shoulder joint was unremarkable.  Patient was given a prescription for PT and we will see the response to physical therapy.  Pain in right hand -she complains of right hand discomfort.  No synovitis was noted.  She has PIP prominence consistent with osteoarthritis.  She would benefit from physical therapy and occupational therapy.  A handout on exercises was given.  Plan: XR Hand 2 View Right.  X-ray of the right hand was consistent with osteoarthritis.  Trochanteric bursitis, left hip-she has been having discomfort over left trochanteric bursa since she was in the hospital.  IT band stretches were given.  She would benefit from physical therapy.  Facet arthritis of lumbar region-she has had lower back pain for a while.  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  Pancreatic cyst  Acute encephalopathy - 2021.  She is followed by Dr. Tomi Likens.  Atypical squamous cells of undetermined significance on cytologic smear of cervix (ASC-US)  Beta thalassemia ([facility])  History of osteoporosis  Orders: Orders Placed This Encounter  Procedures  . XR Shoulder Right  . XR Hand 2 View Right   No orders of the defined types were placed in this encounter.    Follow-Up Instructions: Return in about 3 months (around 12/15/2020) for Osteoarthritis, +RF.   Bo Merino, MD  Note - This record has been created using Editor, commissioning.  Chart creation errors have been sought, but may not always  have been located. Such creation errors do not reflect on  the standard of medical care.

## 2020-09-03 LAB — HEMOGLOBIN A1C: Hemoglobin A1C: 5.2

## 2020-09-05 ENCOUNTER — Encounter: Payer: Self-pay | Admitting: Internal Medicine

## 2020-09-06 ENCOUNTER — Ambulatory Visit
Admission: RE | Admit: 2020-09-06 | Discharge: 2020-09-06 | Disposition: A | Discharge status: Home / Self Care | Payer: Medicare Other | Source: Ambulatory Visit | Attending: Nephrology | Admitting: Nephrology

## 2020-09-06 DIAGNOSIS — N281 Cyst of kidney, acquired: Secondary | ICD-10-CM | POA: Diagnosis not present

## 2020-09-06 DIAGNOSIS — N184 Chronic kidney disease, stage 4 (severe): Secondary | ICD-10-CM

## 2020-09-06 DIAGNOSIS — N189 Chronic kidney disease, unspecified: Secondary | ICD-10-CM | POA: Diagnosis not present

## 2020-09-07 ENCOUNTER — Encounter: Payer: Self-pay | Admitting: Podiatry

## 2020-09-07 ENCOUNTER — Ambulatory Visit: Payer: Medicare Other | Admitting: Podiatry

## 2020-09-07 ENCOUNTER — Other Ambulatory Visit: Payer: Self-pay

## 2020-09-07 DIAGNOSIS — M79675 Pain in left toe(s): Secondary | ICD-10-CM | POA: Diagnosis not present

## 2020-09-07 DIAGNOSIS — E1122 Type 2 diabetes mellitus with diabetic chronic kidney disease: Secondary | ICD-10-CM

## 2020-09-07 DIAGNOSIS — M79674 Pain in right toe(s): Secondary | ICD-10-CM | POA: Diagnosis not present

## 2020-09-07 DIAGNOSIS — N184 Chronic kidney disease, stage 4 (severe): Secondary | ICD-10-CM | POA: Diagnosis not present

## 2020-09-07 DIAGNOSIS — L84 Corns and callosities: Secondary | ICD-10-CM | POA: Diagnosis not present

## 2020-09-07 DIAGNOSIS — B351 Tinea unguium: Secondary | ICD-10-CM | POA: Diagnosis not present

## 2020-09-07 DIAGNOSIS — L853 Xerosis cutis: Secondary | ICD-10-CM

## 2020-09-07 NOTE — Patient Instructions (Signed)
For normal skin: Moisturize feet once daily; do not apply between toes A.  CeraVe Daily Moisturizing Lotion B.  Vaseline Intensive Care Lotion C.  Lubriderm Lotion D.  Gold Bond Diabetic Foot Lotion E.  Eucerin Intensive Repair Moisturizing Lotion  For extremely dry, cracked feet: moisturize feet once daily; do not apply between toes A. CeraVe Healing Ointment B. Aquaphor Healing Ointment C. Vaseline Petroleum Healing Jelly   If you have problems reaching your feet: apply to feet once daily; do not apply between toes A.  Aquaphor Advanced Therapy Ointment Body Spray B.  Vaseline Intensive Care Spray Lotion Advanced Repair

## 2020-09-12 ENCOUNTER — Inpatient Hospital Stay: Admission: RE | Admit: 2020-09-12 | Payer: Medicare Other | Source: Ambulatory Visit

## 2020-09-14 ENCOUNTER — Telehealth: Payer: Self-pay | Admitting: Radiology

## 2020-09-14 ENCOUNTER — Ambulatory Visit: Payer: Medicare Other | Admitting: Rheumatology

## 2020-09-14 ENCOUNTER — Encounter: Payer: Self-pay | Admitting: Rheumatology

## 2020-09-14 ENCOUNTER — Ambulatory Visit: Payer: Self-pay

## 2020-09-14 ENCOUNTER — Other Ambulatory Visit: Payer: Self-pay

## 2020-09-14 VITALS — BP 137/80 | HR 65 | Ht 64.5 in | Wt 158.0 lb

## 2020-09-14 DIAGNOSIS — M7062 Trochanteric bursitis, left hip: Secondary | ICD-10-CM | POA: Diagnosis not present

## 2020-09-14 DIAGNOSIS — K862 Cyst of pancreas: Secondary | ICD-10-CM

## 2020-09-14 DIAGNOSIS — D561 Beta thalassemia: Secondary | ICD-10-CM

## 2020-09-14 DIAGNOSIS — I10 Essential (primary) hypertension: Secondary | ICD-10-CM

## 2020-09-14 DIAGNOSIS — R87610 Atypical squamous cells of undetermined significance on cytologic smear of cervix (ASC-US): Secondary | ICD-10-CM

## 2020-09-14 DIAGNOSIS — I129 Hypertensive chronic kidney disease with stage 1 through stage 4 chronic kidney disease, or unspecified chronic kidney disease: Secondary | ICD-10-CM | POA: Diagnosis not present

## 2020-09-14 DIAGNOSIS — I2510 Atherosclerotic heart disease of native coronary artery without angina pectoris: Secondary | ICD-10-CM

## 2020-09-14 DIAGNOSIS — M25511 Pain in right shoulder: Secondary | ICD-10-CM | POA: Diagnosis not present

## 2020-09-14 DIAGNOSIS — M0579 Rheumatoid arthritis with rheumatoid factor of multiple sites without organ or systems involvement: Secondary | ICD-10-CM

## 2020-09-14 DIAGNOSIS — N281 Cyst of kidney, acquired: Secondary | ICD-10-CM | POA: Diagnosis not present

## 2020-09-14 DIAGNOSIS — Z8639 Personal history of other endocrine, nutritional and metabolic disease: Secondary | ICD-10-CM | POA: Diagnosis not present

## 2020-09-14 DIAGNOSIS — R768 Other specified abnormal immunological findings in serum: Secondary | ICD-10-CM

## 2020-09-14 DIAGNOSIS — Z8739 Personal history of other diseases of the musculoskeletal system and connective tissue: Secondary | ICD-10-CM

## 2020-09-14 DIAGNOSIS — N184 Chronic kidney disease, stage 4 (severe): Secondary | ICD-10-CM

## 2020-09-14 DIAGNOSIS — M47816 Spondylosis without myelopathy or radiculopathy, lumbar region: Secondary | ICD-10-CM | POA: Diagnosis not present

## 2020-09-14 DIAGNOSIS — I739 Peripheral vascular disease, unspecified: Secondary | ICD-10-CM

## 2020-09-14 DIAGNOSIS — E1122 Type 2 diabetes mellitus with diabetic chronic kidney disease: Secondary | ICD-10-CM | POA: Diagnosis not present

## 2020-09-14 DIAGNOSIS — R1319 Other dysphagia: Secondary | ICD-10-CM

## 2020-09-14 DIAGNOSIS — Z9884 Bariatric surgery status: Secondary | ICD-10-CM

## 2020-09-14 DIAGNOSIS — M79641 Pain in right hand: Secondary | ICD-10-CM

## 2020-09-14 DIAGNOSIS — Z8719 Personal history of other diseases of the digestive system: Secondary | ICD-10-CM

## 2020-09-14 DIAGNOSIS — G8929 Other chronic pain: Secondary | ICD-10-CM | POA: Diagnosis not present

## 2020-09-14 DIAGNOSIS — G9340 Encephalopathy, unspecified: Secondary | ICD-10-CM

## 2020-09-14 NOTE — Patient Instructions (Signed)
Iliotibial Band Syndrome Rehab Ask your health care provider which exercises are safe for you. Do exercises exactly as told by your health care provider and adjust them as directed. It is normal to feel mild stretching, pulling, tightness, or discomfort as you do these exercises. Stop right away if you feel sudden pain or your pain gets significantly worse. Do not begin these exercises until told by your health care provider. Stretching and range-of-motion exercises These exercises warm up your muscles and joints and improve the movement and flexibility of your hip and pelvis. Quadriceps stretch, prone 1. Lie on your abdomen (prone position) on a firm surface, such as a bed or padded floor. 2. Bend your left / right knee and reach back to hold your ankle or pant leg. If you cannot reach your ankle or pant leg, loop a belt around your foot and grab the belt instead. 3. Gently pull your heel toward your buttocks. Your knee should not slide out to the side. You should feel a stretch in the front of your thigh and knee (quadriceps). 4. Hold this position for __________ seconds. Repeat __________ times. Complete this exercise __________ times a day.   Iliotibial band stretch An iliotibial band is a strong band of muscle tissue that runs from the outer side of your hip to the outer side of your thigh and knee. 1. Lie on your side with your left / right leg in the top position. 2. Bend both of your knees and grab your left / right ankle. Stretch out your bottom arm to help you balance. 3. Slowly bring your top knee back so your thigh goes behind your trunk. 4. Slowly lower your top leg toward the floor until you feel a gentle stretch on the outside of your left / right hip and thigh. If you do not feel a stretch and your knee will not fall farther, place the heel of your other foot on top of your knee and pull your knee down toward the floor with your foot. 5. Hold this position for __________  seconds. Repeat __________ times. Complete this exercise __________ times a day.   Strengthening exercises These exercises build strength and endurance in your hip and pelvis. Endurance is the ability to use your muscles for a long time, even after they get tired. Straight leg raises, side-lying This exercise strengthens the muscles that rotate the leg at the hip and move it away from your body (hip abductors). 1. Lie on your side with your left / right leg in the top position. Lie so your head, shoulder, hip, and knee line up. You may bend your bottom knee to help you balance. 2. Roll your hips slightly forward so your hips are stacked directly over each other and your left / right knee is facing forward. 3. Tense the muscles in your outer thigh and lift your top leg 4-6 inches (10-15 cm). 4. Hold this position for __________ seconds. 5. Slowly lower your leg to return to the starting position. Let your muscles relax completely before doing another repetition. Repeat __________ times. Complete this exercise __________ times a day.   Leg raises, prone This exercise strengthens the muscles that move the hips backward (hip extensors). 1. Lie on your abdomen (prone position) on your bed or a firm surface. You can put a pillow under your hips if that is more comfortable for your lower back. 2. Bend your left / right knee so your foot is straight up in the air.  3. Squeeze your buttocks muscles and lift your left / right thigh off the bed. Do not let your back arch. 4. Tense your thigh muscle as hard as you can without increasing any knee pain. 5. Hold this position for __________ seconds. 6. Slowly lower your leg to return to the starting position and allow it to relax completely. Repeat __________ times. Complete this exercise __________ times a day. Hip hike 1. Stand sideways on a bottom step. Stand on your left / right leg with your other foot unsupported next to the step. You can hold on to a  railing or wall for balance if needed. 2. Keep your knees straight and your torso square. Then lift your left / right hip up toward the ceiling. 3. Slowly let your left / right hip lower toward the floor, past the starting position. Your foot should get closer to the floor. Do not lean or bend your knees. Repeat __________ times. Complete this exercise __________ times a day. This information is not intended to replace advice given to you by your health care provider. Make sure you discuss any questions you have with your health care provider. Document Revised: 08/31/2019 Document Reviewed: 08/31/2019 Elsevier Patient Education  [place]. Shoulder Exercises Ask your health care provider which exercises are safe for you. Do exercises exactly as told by your health care provider and adjust them as directed. It is normal to feel mild stretching, pulling, tightness, or discomfort as you do these exercises. Stop right away if you feel sudden pain or your pain gets worse. Do not begin these exercises until told by your health care provider. Stretching exercises External rotation and abduction This exercise is sometimes called corner stretch. This exercise rotates your arm outward (external rotation) and moves your arm out from your body (abduction). 5. Stand in a doorway with one of your feet slightly in front of the other. This is called a staggered stance. If you cannot reach your forearms to the door frame, stand facing a corner of a room. 6. Choose one of the following positions as told by your health care provider: ? Place your hands and forearms on the door frame above your head. ? Place your hands and forearms on the door frame at the height of your head. ? Place your hands on the door frame at the height of your elbows. 7. Slowly move your weight onto your front foot until you feel a stretch across your chest and in the front of your shoulders. Keep your head and chest upright and keep  your abdominal muscles tight. 8. Hold for __________ seconds. 9. To release the stretch, shift your weight to your back foot. Repeat __________ times. Complete this exercise __________ times a day.   Extension, standing 6. Stand and hold a broomstick, a cane, or a similar object behind your back. ? Your hands should be a little wider than shoulder width apart. ? Your palms should face away from your back. 7. Keeping your elbows straight and your shoulder muscles relaxed, move the stick away from your body until you feel a stretch in your shoulders (extension). ? Avoid shrugging your shoulders while you move the stick. Keep your shoulder blades tucked down toward the middle of your back. 8. Hold for __________ seconds. 9. Slowly return to the starting position. Repeat __________ times. Complete this exercise __________ times a day. Range-of-motion exercises Pendulum 6. Stand near a wall or a surface that you can hold onto for balance. 7.  Bend at the waist and let your left / right arm hang straight down. Use your other arm to support you. Keep your back straight and do not lock your knees. 8. Relax your left / right arm and shoulder muscles, and move your hips and your trunk so your left / right arm swings freely. Your arm should swing because of the motion of your body, not because you are using your arm or shoulder muscles. 9. Keep moving your hips and trunk so your arm swings in the following directions, as told by your health care provider: ? Side to side. ? Forward and backward. ? In clockwise and counterclockwise circles. 10. Continue each motion for __________ seconds, or for as long as told by your health care provider. 11. Slowly return to the starting position. Repeat __________ times. Complete this exercise __________ times a day.   Shoulder flexion, standing 7. Stand and hold a broomstick, a cane, or a similar object. Place your hands a little more than shoulder width apart on  the object. Your left / right hand should be palm up, and your other hand should be palm down. 8. Keep your elbow straight and your shoulder muscles relaxed. Push the stick up with your healthy arm to raise your left / right arm in front of your body, and then over your head until you feel a stretch in your shoulder (flexion). ? Avoid shrugging your shoulder while you raise your arm. Keep your shoulder blade tucked down toward the middle of your back. 9. Hold for __________ seconds. 10. Slowly return to the starting position. Repeat __________ times. Complete this exercise __________ times a day.   Shoulder abduction, standing 4. Stand and hold a broomstick, a cane, or a similar object. Place your hands a little more than shoulder width apart on the object. Your left / right hand should be palm up, and your other hand should be palm down. 5. Keep your elbow straight and your shoulder muscles relaxed. Push the object across your body toward your left / right side. Raise your left / right arm to the side of your body (abduction) until you feel a stretch in your shoulder. ? Do not raise your arm above shoulder height unless your health care provider tells you to do that. ? If directed, raise your arm over your head. ? Avoid shrugging your shoulder while you raise your arm. Keep your shoulder blade tucked down toward the middle of your back. 6. Hold for __________ seconds. 7. Slowly return to the starting position. Repeat __________ times. Complete this exercise __________ times a day. Internal rotation 1. Place your left / right hand behind your back, palm up. 2. Use your other hand to dangle an exercise band, a towel, or a similar object over your shoulder. Grasp the band with your left / right hand so you are holding on to both ends. 3. Gently pull up on the band until you feel a stretch in the front of your left / right shoulder. The movement of your arm toward the center of your body is called  internal rotation. ? Avoid shrugging your shoulder while you raise your arm. Keep your shoulder blade tucked down toward the middle of your back. 4. Hold for __________ seconds. 5. Release the stretch by letting go of the band and lowering your hands. Repeat __________ times. Complete this exercise __________ times a day.   Strengthening exercises External rotation 1. Sit in a stable chair without armrests. 2. Secure  an exercise band to a stable object at elbow height on your left / right side. 3. Place a soft object, such as a folded towel or a small pillow, between your left / right upper arm and your body to move your elbow about 4 inches (10 cm) away from your side. 4. Hold the end of the exercise band so it is tight and there is no slack. 5. Keeping your elbow pressed against the soft object, slowly move your forearm out, away from your abdomen (external rotation). Keep your body steady so only your forearm moves. 6. Hold for __________ seconds. 7. Slowly return to the starting position. Repeat __________ times. Complete this exercise __________ times a day.   Shoulder abduction 1. Sit in a stable chair without armrests, or stand up. 2. Hold a __________ weight in your left / right hand, or hold an exercise band with both hands. 3. Start with your arms straight down and your left / right palm facing in, toward your body. 4. Slowly lift your left / right hand out to your side (abduction). Do not lift your hand above shoulder height unless your health care provider tells you that this is safe. ? Keep your arms straight. ? Avoid shrugging your shoulder while you do this movement. Keep your shoulder blade tucked down toward the middle of your back. 5. Hold for __________ seconds. 6. Slowly lower your arm, and return to the starting position. Repeat __________ times. Complete this exercise __________ times a day.   Shoulder extension 1. Sit in a stable chair without armrests, or stand  up. 2. Secure an exercise band to a stable object in front of you so it is at shoulder height. 3. Hold one end of the exercise band in each hand. Your palms should face each other. 4. Straighten your elbows and lift your hands up to shoulder height. 5. Step back, away from the secured end of the exercise band, until the band is tight and there is no slack. 6. Squeeze your shoulder blades together as you pull your hands down to the sides of your thighs (extension). Stop when your hands are straight down by your sides. Do not let your hands go behind your body. 7. Hold for __________ seconds. 8. Slowly return to the starting position. Repeat __________ times. Complete this exercise __________ times a day. Shoulder row 1. Sit in a stable chair without armrests, or stand up. 2. Secure an exercise band to a stable object in front of you so it is at waist height. 3. Hold one end of the exercise band in each hand. Position your palms so that your thumbs are facing the ceiling (neutral position). 4. Bend each of your elbows to a 90-degree angle (right angle) and keep your upper arms at your sides. 5. Step back until the band is tight and there is no slack. 6. Slowly pull your elbows back behind you. 7. Hold for __________ seconds. 8. Slowly return to the starting position. Repeat __________ times. Complete this exercise __________ times a day. Shoulder press-ups 1. Sit in a stable chair that has armrests. Sit upright, with your feet flat on the floor. 2. Put your hands on the armrests so your elbows are bent and your fingers are pointing forward. Your hands should be about even with the sides of your body. 3. Push down on the armrests and use your arms to lift yourself off the chair. Straighten your elbows and lift yourself up as much as you  comfortably can. ? Move your shoulder blades down, and avoid letting your shoulders move up toward your ears. ? Keep your feet on the ground. As you get  stronger, your feet should support less of your body weight as you lift yourself up. 4. Hold for __________ seconds. 5. Slowly lower yourself back into the chair. Repeat __________ times. Complete this exercise __________ times a day.   Wall push-ups 1. Stand so you are facing a stable wall. Your feet should be about one arm-length away from the wall. 2. Lean forward and place your palms on the wall at shoulder height. 3. Keep your feet flat on the floor as you bend your elbows and lean forward toward the wall. 4. Hold for __________ seconds. 5. Straighten your elbows to push yourself back to the starting position. Repeat __________ times. Complete this exercise __________ times a day.   This information is not intended to replace advice given to you by your health care provider. Make sure you discuss any questions you have with your health care provider. Document Revised: 10/15/2018 Document Reviewed: 07/23/2018 Elsevier Patient Education  2021 [place]. Hand Exercises Hand exercises can be helpful for almost anyone. These exercises can strengthen the hands, improve flexibility and movement, and increase blood flow to the hands. These results can make work and daily tasks easier. Hand exercises can be especially helpful for people who have joint pain from arthritis or have nerve damage from overuse (carpal tunnel syndrome). These exercises can also help people who have injured a hand. Exercises Most of these hand exercises are gentle stretching and motion exercises. It is usually safe to do them often throughout the day. Warming up your hands before exercise may help to reduce stiffness. You can do this with gentle massage or by placing your hands in warm water for 10-15 minutes. It is normal to feel some stretching, pulling, tightness, or mild discomfort as you begin new exercises. This will gradually improve. Stop an exercise right away if you feel sudden, severe pain or your pain gets  worse. Ask your health care provider which exercises are best for you. Knuckle bend or "claw" fist 1. Stand or sit with your arm, hand, and all five fingers pointed straight up. Make sure to keep your wrist straight during the exercise. 2. Gently bend your fingers down toward your palm until the tips of your fingers are touching the top of your palm. Keep your big knuckle straight and just bend the small knuckles in your fingers. 3. Hold this position for __________ seconds. 4. Straighten (extend) your fingers back to the starting position. Repeat this exercise 5-10 times with each hand. Full finger fist 1. Stand or sit with your arm, hand, and all five fingers pointed straight up. Make sure to keep your wrist straight during the exercise. 2. Gently bend your fingers into your palm until the tips of your fingers are touching the middle of your palm. 3. Hold this position for __________ seconds. 4. Extend your fingers back to the starting position, stretching every joint fully. Repeat this exercise 5-10 times with each hand. Straight fist 1. Stand or sit with your arm, hand, and all five fingers pointed straight up. Make sure to keep your wrist straight during the exercise. 2. Gently bend your fingers at the big knuckle, where your fingers meet your hand, and the middle knuckle. Keep the knuckle at the tips of your fingers straight and try to touch the bottom of your palm. 3. Hold  this position for __________ seconds. 4. Extend your fingers back to the starting position, stretching every joint fully. Repeat this exercise 5-10 times with each hand. Tabletop 1. Stand or sit with your arm, hand, and all five fingers pointed straight up. Make sure to keep your wrist straight during the exercise. 2. Gently bend your fingers at the big knuckle, where your fingers meet your hand, as far down as you can while keeping the small knuckles in your fingers straight. Think of forming a tabletop with your  fingers. 3. Hold this position for __________ seconds. 4. Extend your fingers back to the starting position, stretching every joint fully. Repeat this exercise 5-10 times with each hand. Finger spread 1. Place your hand flat on a table with your palm facing down. Make sure your wrist stays straight as you do this exercise. 2. Spread your fingers and thumb apart from each other as far as you can until you feel a gentle stretch. Hold this position for __________ seconds. 3. Bring your fingers and thumb tight together again. Hold this position for __________ seconds. Repeat this exercise 5-10 times with each hand. Making circles 1. Stand or sit with your arm, hand, and all five fingers pointed straight up. Make sure to keep your wrist straight during the exercise. 2. Make a circle by touching the tip of your thumb to the tip of your index finger. 3. Hold for __________ seconds. Then open your hand wide. 4. Repeat this motion with your thumb and each finger on your hand. Repeat this exercise 5-10 times with each hand. Thumb motion 1. Sit with your forearm resting on a table and your wrist straight. Your thumb should be facing up toward the ceiling. Keep your fingers relaxed as you move your thumb. 2. Lift your thumb up as high as you can toward the ceiling. Hold for __________ seconds. 3. Bend your thumb across your palm as far as you can, reaching the tip of your thumb for the small finger (pinkie) side of your palm. Hold for __________ seconds. Repeat this exercise 5-10 times with each hand. Grip strengthening 1. Hold a stress ball or other soft ball in the middle of your hand. 2. Slowly increase the pressure, squeezing the ball as much as you can without causing pain. Think of bringing the tips of your fingers into the middle of your palm. All of your finger joints should bend when doing this exercise. 3. Hold your squeeze for __________ seconds, then relax. Repeat this exercise 5-10 times  with each hand.   Contact a health care provider if:  Your hand pain or discomfort gets much worse when you do an exercise.  Your hand pain or discomfort does not improve within 2 hours after you exercise. If you have any of these problems, stop doing these exercises right away. Do not do them again unless your health care provider says that you can. Get help right away if:  You develop sudden, severe hand pain or swelling. If this happens, stop doing these exercises right away. Do not do them again unless your health care provider says that you can. This information is not intended to replace advice given to you by your health care provider. Make sure you discuss any questions you have with your health care provider. Document Revised: 10/14/2018 Document Reviewed: 06/24/2018 Elsevier Patient Education  2021 [employer].

## 2020-09-14 NOTE — Telephone Encounter (Signed)
Spoke with patient's daughter, [name], advised right shoulder x-ray was normal and right hand x-ray showed osteoarthritis. Advised patient referral for PT has been ordered.

## 2020-09-15 NOTE — Progress Notes (Signed)
Subjective: Miranda Roman presents today at risk foot care. Pt has h/o NIDDM with chronic kidney disease and painful callus(es) b/l feet and painful mycotic toenails b/l that are difficult to trim. Pain interferes with ambulation. Aggravating factors include wearing enclosed shoe gear. Pain is relieved with periodic professional debridement.   Her son, Miranda Roman, is visiting from [place], [place].   Miranda Roman c/o numbness in feet.   She also c/o pain in her toes when she walks.   Miranda Chard, MD is patient's PCP. Last visit was: June 11, 2020.  Allergies  Allergen Reactions  . 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 Lactose Intolerance (Gi)     Per pt's daughter causes gas   Objective: Miranda Roman is a pleasant 75 y.o. AAF in NAD. AAO x 3.   There were no vitals filed for this visit.  Vascular Examination: Neurovascular status unchanged b/l. Capillary refill time to digits immediate b/l. Palpable DP pulses b/l. Palpable PT pulses b/l. Pedal hair absent b/l Skin temperature gradient within normal limits b/l.  Dermatological Examination: Pedal skin with normal turgor, texture and tone bilaterally. No open wounds bilaterally. No interdigital macerations bilaterally. Toenails 1-5 b/l elongated, discolored, dystrophic, thickened, crumbly with subungual debris and tenderness to dorsal palpation. Hyperkeratotic lesion(s) submet head 5 left foot and submet head 5 right foot.  No erythema, no edema, no drainage, no fluctuance. Pedal skin noted to be dry and flaky b/l lower extremities.   Flesh colored elevated lesion noted dorsum of left foot, stable and unchanged. No symptoms such as pain, bleeding. No asymmetry, borders normal, color normal, no underlying nodularity.  Musculoskeletal: Normal muscle strength 5/5 to all lower extremity muscle groups bilaterally. No pain crepitus or joint limitation noted with ROM b/l. Tailor's  bunion deformity noted b/l.  Neurological Examination: Protective sensation intact 5/5 intact bilaterally with 10g monofilament b/l. Proprioception intact bilaterally.   Hemoglobin A1C Latest Ref Rng & Units 04/09/2020 01/14/2020 09/28/2019  HGBA1C 4.8 - 5.6 % 5.2 7.1(H) 6.1(H)  Some recent data might be hidden   Assessment: 1. Pain due to onychomycosis of toenails of both feet   2. Callus   3. Xerosis cutis   4. Diabetes mellitus with stage 4 chronic kidney disease ([place])     Plan: -Continue diabetic foot care principles. -We did discuss her gabapentin dosage and side effects of increased dosage. -I do believe the pain in her toes are due to her toenails being overgrown and curving into the distal tips of her toes. Toenails 1-5 b/l were debrided in length and girth with sterile nail nippers and dremel without iatrogenic bleeding. She did relate her feet felt better.  -Callus(es) submet head 5 left foot and submet head 5 right foot pared utilizing sterile scalpel blade without complication or incident. Total number debrided =2. -Dispensed list of OTC moisturizers she may use for daily care of feet. -Patient to report any pedal injuries to medical professional immediately.  Return in about 3 months (around 12/08/2020).  Miranda Roman, DPM

## 2020-09-17 DIAGNOSIS — N184 Chronic kidney disease, stage 4 (severe): Secondary | ICD-10-CM | POA: Diagnosis not present

## 2020-09-17 DIAGNOSIS — E119 Type 2 diabetes mellitus without complications: Secondary | ICD-10-CM | POA: Diagnosis not present

## 2020-09-17 DIAGNOSIS — G9341 Metabolic encephalopathy: Secondary | ICD-10-CM | POA: Diagnosis not present

## 2020-09-17 DIAGNOSIS — D696 Thrombocytopenia, unspecified: Secondary | ICD-10-CM | POA: Diagnosis not present

## 2020-09-27 ENCOUNTER — Encounter (HOSPITAL_COMMUNITY): Payer: Medicare Other

## 2020-09-28 ENCOUNTER — Encounter (HOSPITAL_COMMUNITY): Payer: Self-pay | Admitting: Internal Medicine

## 2020-10-02 ENCOUNTER — Ambulatory Visit: Payer: Medicare Other | Admitting: Physical Therapy

## 2020-10-03 ENCOUNTER — Ambulatory Visit (INDEPENDENT_AMBULATORY_CARE_PROVIDER_SITE_OTHER): Payer: Medicare Other | Admitting: Internal Medicine

## 2020-10-03 ENCOUNTER — Other Ambulatory Visit: Payer: Self-pay

## 2020-10-03 ENCOUNTER — Encounter: Payer: Self-pay | Admitting: Internal Medicine

## 2020-10-03 ENCOUNTER — Ambulatory Visit: Payer: Medicare Other

## 2020-10-03 VITALS — BP 122/70 | HR 67 | Temp 97.8°F | Ht 64.5 in | Wt 162.4 lb

## 2020-10-03 DIAGNOSIS — H539 Unspecified visual disturbance: Secondary | ICD-10-CM | POA: Diagnosis not present

## 2020-10-03 DIAGNOSIS — Z0000 Encounter for general adult medical examination without abnormal findings: Secondary | ICD-10-CM | POA: Diagnosis not present

## 2020-10-03 DIAGNOSIS — N184 Chronic kidney disease, stage 4 (severe): Secondary | ICD-10-CM

## 2020-10-03 DIAGNOSIS — I129 Hypertensive chronic kidney disease with stage 1 through stage 4 chronic kidney disease, or unspecified chronic kidney disease: Secondary | ICD-10-CM

## 2020-10-03 DIAGNOSIS — Z6827 Body mass index (BMI) 27.0-27.9, adult: Secondary | ICD-10-CM

## 2020-10-03 DIAGNOSIS — Z79899 Other long term (current) drug therapy: Secondary | ICD-10-CM

## 2020-10-03 DIAGNOSIS — E1122 Type 2 diabetes mellitus with diabetic chronic kidney disease: Secondary | ICD-10-CM | POA: Diagnosis not present

## 2020-10-03 DIAGNOSIS — D573 Sickle-cell trait: Secondary | ICD-10-CM

## 2020-10-03 DIAGNOSIS — K143 Hypertrophy of tongue papillae: Secondary | ICD-10-CM | POA: Diagnosis not present

## 2020-10-03 DIAGNOSIS — E663 Overweight: Secondary | ICD-10-CM

## 2020-10-03 LAB — POCT URINALYSIS DIPSTICK
Bilirubin, UA: NEGATIVE
Blood, UA: NEGATIVE
Glucose, UA: NEGATIVE
Ketones, UA: NEGATIVE
Nitrite, UA: POSITIVE
Protein, UA: POSITIVE — AB
Spec Grav, UA: 1.015 (ref 1.010–1.025)
Urobilinogen, UA: 0.2 E.U./dL
pH, UA: 5.5 (ref 5.0–8.0)

## 2020-10-03 LAB — POCT UA - MICROALBUMIN

## 2020-10-03 NOTE — Patient Instructions (Addendum)
Lidocaine patch,   Health Maintenance, Female Adopting a healthy lifestyle and getting preventive care are important in promoting health and wellness. Ask your health care provider about:  The right schedule for you to have regular tests and exams.  Things you can do on your own to prevent diseases and keep yourself healthy. What should I know about diet, weight, and exercise? Eat a healthy diet  Eat a diet that includes plenty of vegetables, fruits, low-fat dairy products, and lean protein.  Do not eat a lot of foods that are high in solid fats, added sugars, or sodium.   Maintain a healthy weight Body mass index (BMI) is used to identify weight problems. It estimates body fat based on height and weight. Your health care provider can help determine your BMI and help you achieve or maintain a healthy weight. Get regular exercise Get regular exercise. This is one of the most important things you can do for your health. Most adults should:  Exercise for at least 150 minutes each week. The exercise should increase your heart rate and make you sweat (moderate-intensity exercise).  Do strengthening exercises at least twice a week. This is in addition to the moderate-intensity exercise.  Spend less time sitting. Even light physical activity can be beneficial. Watch cholesterol and blood lipids Have your blood tested for lipids and cholesterol at 74 years of age, then have this test every 5 years. Have your cholesterol levels checked more often if:  Your lipid or cholesterol levels are high.  You are older than 74 years of age.  You are at high risk for heart disease. What should I know about cancer screening? Depending on your health history and family history, you may need to have cancer screening at various ages. This may include screening for:  Breast cancer.  Cervical cancer.  Colorectal cancer.  Skin cancer.  Lung cancer. What should I know about heart disease, diabetes,  and high blood pressure? Blood pressure and heart disease  High blood pressure causes heart disease and increases the risk of stroke. This is more likely to develop in people who have high blood pressure readings, are of African descent, or are overweight.  Have your blood pressure checked: ? Every 3-5 years if you are 74-74 years of age. ? Every year if you are 74 years old or older. Diabetes Have regular diabetes screenings. This checks your fasting blood sugar level. Have the screening done:  Once every three years after age 4 if you are at a normal weight and have a low risk for diabetes.  More often and at a younger age if you are overweight or have a high risk for diabetes. What should I know about preventing infection? Hepatitis B If you have a higher risk for hepatitis B, you should be screened for this virus. Talk with your health care provider to find out if you are at risk for hepatitis B infection. Hepatitis C Testing is recommended for:  Everyone born from [birth year] through 1964.  Anyone with known risk factors for hepatitis C. Sexually transmitted infections (STIs)  Get screened for STIs, including gonorrhea and chlamydia, if: ? You are sexually active and are younger than 74 years of age. ? You are older than 74 years of age and your health care provider tells you that you are at risk for this type of infection. ? Your sexual activity has changed since you were last screened, and you are at increased risk for chlamydia or gonorrhea. Ask  your health care provider if you are at risk.  Ask your health care provider about whether you are at high risk for HIV. Your health care provider may recommend a prescription medicine to help prevent HIV infection. If you choose to take medicine to prevent HIV, you should first get tested for HIV. You should then be tested every 3 months for as long as you are taking the medicine. Pregnancy  If you are about to stop having your period  (premenopausal) and you may become pregnant, seek counseling before you get pregnant.  Take 400 to 800 micrograms (mcg) of folic acid every day if you become pregnant.  Ask for birth control (contraception) if you want to prevent pregnancy. Osteoporosis and menopause Osteoporosis is a disease in which the bones lose minerals and strength with aging. This can result in bone fractures. If you are 81 years old or older, or if you are at risk for osteoporosis and fractures, ask your health care provider if you should:  Be screened for bone loss.  Take a calcium or vitamin D supplement to lower your risk of fractures.  Be given hormone replacement therapy (HRT) to treat symptoms of menopause. Follow these instructions at home: Lifestyle  Do not use any products that contain nicotine or tobacco, such as cigarettes, e-cigarettes, and chewing tobacco. If you need help quitting, ask your health care provider.  Do not use street drugs.  Do not share needles.  Ask your health care provider for help if you need support or information about quitting drugs. Alcohol use  Do not drink alcohol if: ? Your health care provider tells you not to drink. ? You are pregnant, may be pregnant, or are planning to become pregnant.  If you drink alcohol: ? Limit how much you use to 0-1 drink a day. ? Limit intake if you are breastfeeding.  Be aware of how much alcohol is in your drink. In the [place] one drink equals one 12 oz bottle of beer (355 mL), one 5 oz glass of wine (148 mL), or one 1 oz glass of hard liquor (44 mL). General instructions  Schedule regular health, dental, and eye exams.  Stay current with your vaccines.  Tell your health care provider if: ? You often feel depressed. ? You have ever been abused or do not feel safe at home. Summary  Adopting a healthy lifestyle and getting preventive care are important in promoting health and wellness.  Follow your health care provider's  instructions about healthy diet, exercising, and getting tested or screened for diseases.  Follow your health care provider's instructions on monitoring your cholesterol and blood pressure. This information is not intended to replace advice given to you by your health care provider. Make sure you discuss any questions you have with your health care provider. Document Revised: 06/16/2018 Document Reviewed: 06/16/2018 Elsevier Patient Education  2021 [employer].

## 2020-10-03 NOTE — Progress Notes (Signed)
Earleen Newport as a Education administrator for [name], MD.,have documented all relevant documentation on the behalf of [name], MD,as directed by  [name], MD while in the presence of [name], MD. This visit occurred during the SARS-CoV-2 public health emergency.  Safety protocols were in place, including screening questions prior to the visit, additional usage of staff PPE, and extensive cleaning of exam room while observing appropriate contact time as indicated for disinfecting solutions.  Subjective:     Patient ID: Miranda Roman , female    DOB: [birth year] , 75 y.o.   MRN: [id number]   Chief Complaint  Patient presents with  . Annual Exam  . Diabetes  . Hypertension    HPI  She is here today for a full physical examination. She is followed by [facility] for her GYN exams.  She is accompanied by her daughter, [name] today. She reports "I feel good".   Hypertension This is a chronic problem. The current episode started more than 1 year ago. The problem has been gradually improving since onset. Pertinent negatives include no chest pain, palpitations or shortness of breath. The current treatment provides moderate improvement.  Diabetes She presents for her follow-up diabetic visit. She has type 2 diabetes mellitus. Her disease course has been stable. There are no hypoglycemic associated symptoms. There are no diabetic associated symptoms. Pertinent negatives for diabetes include no chest pain. There are no hypoglycemic complications. Diabetic complications include nephropathy. Risk factors for coronary artery disease include diabetes mellitus, dyslipidemia, hypertension, sedentary lifestyle and post-menopausal. Current diabetic treatment includes insulin injections. She is compliant with treatment most of the time. She is following a diabetic diet. Meal planning includes avoidance of concentrated sweets.     Past Medical History:  Diagnosis Date  . Anemia   .  Diabetes mellitus   . Hyperlipidemia   . Hypertension   . Osteoporosis   . Sickle cell trait ([place])   . 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 Diabetes Mother   . Heart disease Mother   . Hypertension Mother   . Stroke Mother   . Diabetes Father   . Heart disease Father   . Diabetes Sister   . Diabetes Brother   . Hypertension Brother   . Sickle cell anemia Brother   . Diabetes Maternal Aunt   . Diabetes Maternal Uncle   . Diabetes Maternal Grandmother   . Diabetes Maternal Grandfather   . Sickle cell anemia Sister   . Sickle cell anemia Sister   . Healthy Son   . Healthy Son   . Healthy Daughter      Current Outpatient Medications:  .  acetaminophen (TYLENOL) 500 MG tablet, Take 1 tablet (500 mg total) by mouth every 6 (six) hours as needed for moderate pain or fever., Disp: 30 tablet, Rfl: 0 .  acyclovir (ZOVIRAX) 200 MG capsule, Take 200 mg by mouth 3 (three) times daily., Disp: , Rfl:  .  amLODipine (NORVASC) 10 MG tablet, 1 tablet, Disp: , Rfl:  .  BD INSULIN SYRINGE U/F 31G X 5/16" 0.3 ML MISC, See admin instructions. use with insulin, Disp: , Rfl:  .  calcitRIOL (ROCALTROL) 0.25 MCG capsule, Take 0.25 mcg by mouth 3 (three) times a week., Disp: , Rfl:  .  cetirizine (ZYRTEC) 10 MG tablet, Take 10 mg by mouth daily., Disp: , Rfl:  .  Continuous Blood Gluc Receiver (FREESTYLE  LIBRE 14 DAY READER) DEVI, Use as directed to check blood sugars 4 times per day dx: e11.65, Disp: 1 each, Rfl: 3 .  Continuous Blood Gluc Sensor (FREESTYLE LIBRE 14 DAY SENSOR) MISC, Use as directed to check blood sugars 4 times per day dx: e11.65, Disp: 2 each, Rfl: 3 .  Cyanocobalamin (VITAMIN B12) 1000 MCG TBCR, 1 tablet, Disp: , Rfl:  .  diclofenac Sodium (VOLTAREN) 1 % GEL, Apply 2 g topically 4 (four) times daily. Apply to left knee., Disp: 50 g, Rfl: 0 .  epoetin alfa (PROCRIT) 68341 UNIT/ML injection, , Disp: , Rfl:  .  gabapentin (NEURONTIN) 100 MG  capsule, One capsule po qhs, Disp: 30 capsule, Rfl: 2 .  HUMALOG 100 UNIT/ML injection, Inject 4 Unit(s) SUB-Q 3 Times Daily, Disp: 10 mL, Rfl: 1 .  insulin detemir (LEVEMIR FLEXTOUCH) 100 UNIT/ML FlexPen, 6 Units., Disp: , Rfl:  .  Insulin Pen Needle (BD PEN NEEDLE NANO U/F) 32G X 4 MM MISC, Use as directed with insulin pen, Disp: 150 each, Rfl: 2 .  metoprolol tartrate (LOPRESSOR) 50 MG tablet, Take 50 mg by mouth 2 (two) times daily., Disp: , Rfl:  .  Multiple Vitamins-Minerals (CENTRUM SILVER PO), Take by mouth daily., Disp: , Rfl:  .  sodium bicarbonate 650 MG tablet, Take 650 mg by mouth daily., Disp: , Rfl:  .  thiamine 100 MG tablet, Take 1 tablet (100 mg total) by mouth daily., Disp: , Rfl:  .  atorvastatin (LIPITOR) 20 MG tablet, Take one tablet by mouth M-F and skip weekends, Disp: 75 tablet, Rfl: 1   Allergies  Allergen Reactions  . 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 Lactose Intolerance (Gi)     Per pt's daughter causes gas      The patient states she uses post menopausal status for birth control. Last LMP was No LMP recorded. Patient is postmenopausal.. Negative for Dysmenorrhea. Negative for: breast discharge, breast lump(s), breast pain and breast self exam. Associated symptoms include abnormal vaginal bleeding. Pertinent negatives include abnormal bleeding (hematology), anxiety, decreased libido, depression, difficulty falling sleep, dyspareunia, history of infertility, nocturia, sexual dysfunction, sleep disturbances, urinary incontinence, urinary urgency, vaginal discharge and vaginal itching. Diet regular.The patient states her exercise level is    . The patient's tobacco use is:  Social History   Tobacco Use  Smoking Status Former Smoker  . Packs/day: 0.25  . Years: 43.00  . Pack years: 10.75  . Types: Cigarettes  . Start date: 04/1962  . Quit date: 04/2005  . Years since quitting: 15.5  Smokeless Tobacco  Never Used  . She has been exposed to passive smoke. The patient's alcohol use is:  Social History   Substance and Sexual Activity  Alcohol Use Not Currently   Comment: occasionally   Review of Systems  Constitutional: Negative.   HENT: Positive for dental problem (She feels as if hair is on her tongue. Not sure what is contributing to her sx. Has been evaluated by dentist. NO infection found. previously diagnosed w/ thrush).        She c/o blurred vision. 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   Respiratory: Negative.  Negative for shortness of breath.   Cardiovascular: Negative.  Negative for chest pain and palpitations.  Gastrointestinal: Negative.   Endocrine: Negative.   Genitourinary: Negative.   Musculoskeletal:  Negative.   Skin: Negative.        She is worried about spot on foot. She is concerned b/c it has been there for months. It is not an open wound.   Allergic/Immunologic: Negative.   Neurological: Negative.   Hematological: Negative.   Psychiatric/Behavioral: Negative.      Today's Vitals   10/03/20 1134  BP: 122/70  Pulse: 67  Temp: 97.8 F (36.6 C)  TempSrc: Oral  Weight: 162 lb 6.4 oz (73.7 kg)  Height: 5' 4.5" (1.638 m)   Body mass index is 27.45 kg/m.   Objective:  Physical Exam Vitals and nursing note reviewed.  Constitutional:      Appearance: Normal appearance.  HENT:     Head: Normocephalic and atraumatic.     Right Ear: Tympanic membrane, ear canal and external ear normal.     Left Ear: Tympanic membrane, ear canal and external ear normal.     Nose:     Comments: Masked     Mouth/Throat:     Mouth: Mucous membranes are dry.     Tongue: No lesions. Tongue does not deviate from midline.     Pharynx: Oropharynx is clear. No oropharyngeal exudate or posterior oropharyngeal erythema.  Eyes:     Extraocular Movements: Extraocular movements intact.      Conjunctiva/sclera: Conjunctivae normal.     Pupils: Pupils are equal, round, and reactive to light.  Cardiovascular:     Rate and Rhythm: Normal rate and regular rhythm.     Pulses:          Dorsalis pedis pulses are 2+ on the right side and 2+ on the left side.     Heart sounds: Normal heart sounds.  Pulmonary:     Effort: Pulmonary effort is normal.     Breath sounds: Normal breath sounds.  Chest:  Breasts:     Tanner Score is 5.     Right: Normal.     Left: Normal.    Abdominal:     General: Bowel sounds are normal.     Palpations: Abdomen is soft.  Genitourinary:    Comments: Deferred  Musculoskeletal:     Cervical back: Normal range of motion.     Right lower leg: No edema.     Left lower leg: No edema.  Feet:     Right foot:     Protective Sensation: 5 sites tested. 5 sites sensed.     Skin integrity: Callus present.     Toenail Condition: Right toenails are normal.     Left foot:     Protective Sensation: 5 sites tested. 5 sites sensed.     Skin integrity: Callus present.     Toenail Condition: Left toenails are normal.  Skin:    General: Skin is warm.  Neurological:     Mental Status: She is alert and oriented to person, place, and time.     Comments: She has RUE weakness, pain with movement of RUE  Psychiatric:        Mood and Affect: Mood normal.        Behavior: Behavior normal.         Assessment And Plan:     1. Routine general medical examination at health care facility Comments: A full exam was performed. Importance of monthly self breast exams was discussed with the patient. PATIENT IS ADVISED TO GET 30-45 MINUTES REGULAR EXERCISE NO LESS THAN FOUR TO FIVE DAYS PER WEEK - BOTH WEIGHTBEARING EXERCISES AND  AEROBIC ARE RECOMMENDED.  PATIENT IS ADVISED TO FOLLOW A HEALTHY DIET WITH AT LEAST SIX FRUITS/VEGGIES PER DAY, DECREASE INTAKE OF RED MEAT, AND TO INCREASE FISH INTAKE TO TWO DAYS PER WEEK.  MEATS/FISH SHOULD NOT BE FRIED, BAKED OR BROILED IS  PREFERABLE.  IT IS ALSO IMPORTANT TO CUT BACK ON YOUR SUGAR INTAKE. PLEASE AVOID ANYTHING WITH ADDED SUGAR, CORN SYRUP OR OTHER SWEETENERS. IF YOU MUST USE A SWEETENER, YOU CAN TRY STEVIA. IT IS ALSO IMPORTANT TO AVOID ARTIFICIALLY SWEETENERS AND DIET BEVERAGES. LASTLY, I SUGGEST WEARING SPF 50 SUNSCREEN ON EXPOSED PARTS AND ESPECIALLY WHEN IN THE DIRECT SUNLIGHT FOR AN EXTENDED PERIOD OF TIME.  PLEASE AVOID FAST FOOD RESTAURANTS AND INCREASE YOUR WATER INTAKE.   2. 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 I DISCUSSED WITH THE PATIENT AT LENGTH REGARDING THE GOALS OF GLYCEMIC CONTROL AND POSSIBLE LONG-TERM COMPLICATIONS.  I  ALSO STRESSED THE IMPORTANCE OF COMPLIANCE WITH HOME GLUCOSE MONITORING, DIETARY RESTRICTIONS INCLUDING AVOIDANCE OF SUGARY DRINKS/PROCESSED FOODS,  ALONG WITH REGULAR EXERCISE.  I  ALSO STRESSED THE IMPORTANCE OF ANNUAL EYE EXAMS, SELF FOOT CARE AND COMPLIANCE WITH OFFICE VISITS.  - Protein electrophoresis, serum - CBC - Lipid panel - Hemoglobin A1c  3. Hypertensive nephropathy Comments: Chronic, well controlled. She is encouraged to follow a low sodium diet. EKG performed, SB w/ nonspecific T abnormality. She will f/u in 4 months for re-evaluation.  - Lipid panel - TSH - POCT Urinalysis Dipstick (81002) - POCT UA - Microalbumin - EKG 12-Lead  4. Sickle-cell trait ([place]) Comments: She is encouraged to stay well hydrated.   5. Visual disturbance Comments: She has been evaluated by her eye specialist - reportedly, it is felt that her sx are related to past encephalopathy and not optic nerve.   6. Brown hairy tongue Comments: Advised to scrape tongue daily when she brushes her teeth, stay well hydrated and to maintain good oral health. Will give rx Magic Mouthwash as well.   7. Overweight with body mass index (BMI) of 27 to 27.9 in adult Comments: Her BMI is acceptable for her demographics. She is encouraged  to c/w her chair exercises.   8. Drug therapy - Vitamin B12  Patient was given opportunity to ask questions. Patient verbalized understanding of the plan and was able to repeat key elements of the plan. All questions were answered to their satisfaction.   I, [name], MD, have reviewed all documentation for this visit. The documentation on 10/28/20 for the exam, diagnosis, procedures, and orders are all accurate and complete.  THE PATIENT IS ENCOURAGED TO PRACTICE SOCIAL DISTANCING DUE TO THE COVID-19 PANDEMIC.

## 2020-10-05 LAB — LIPID PANEL
Chol/HDL Ratio: 3.6 ratio (ref 0.0–4.4)
Cholesterol, Total: 213 mg/dL — ABNORMAL HIGH (ref 100–199)
HDL: 59 mg/dL (ref >39)
LDL Chol Calc (NIH): 133 mg/dL — ABNORMAL HIGH (ref 0–99)
Triglycerides: 120 mg/dL (ref 0–149)
VLDL Cholesterol Cal: 21 mg/dL (ref 5–40)

## 2020-10-05 LAB — CBC
Hematocrit: 35.4 % (ref 34.0–46.6)
Hemoglobin: 10.5 g/dL — ABNORMAL LOW (ref 11.1–15.9)
MCH: 22.7 pg — ABNORMAL LOW (ref 26.6–33.0)
MCHC: 29.7 g/dL — ABNORMAL LOW (ref 31.5–35.7)
MCV: 77 fL — ABNORMAL LOW (ref 79–97)
Platelets: 190 10*3/uL (ref 150–450)
RBC: 4.63 x10E6/uL (ref 3.77–5.28)
RDW: 17.3 % — ABNORMAL HIGH (ref 11.7–15.4)
WBC: 7.5 10*3/uL (ref 3.4–10.8)

## 2020-10-05 LAB — PROTEIN ELECTROPHORESIS, SERUM
A/G Ratio: 1.4 (ref 0.7–1.7)
Albumin ELP: 3.7 g/dL (ref 2.9–4.4)
Alpha 1: 0.2 g/dL (ref 0.0–0.4)
Alpha 2: 0.5 g/dL (ref 0.4–1.0)
Beta: 0.9 g/dL (ref 0.7–1.3)
Gamma Globulin: 1 g/dL (ref 0.4–1.8)
Globulin, Total: 2.7 g/dL (ref 2.2–3.9)
Total Protein: 6.4 g/dL (ref 6.0–8.5)

## 2020-10-05 LAB — VITAMIN B12: Vitamin B-12: 827 pg/mL (ref 232–1245)

## 2020-10-05 LAB — TSH: TSH: 1.31 u[IU]/mL (ref 0.450–4.500)

## 2020-10-05 LAB — HEMOGLOBIN A1C
Est. average glucose Bld gHb Est-mCnc: 103 mg/dL
Hgb A1c MFr Bld: 5.2 % (ref 4.8–5.6)

## 2020-10-11 ENCOUNTER — Encounter (HOSPITAL_COMMUNITY): Payer: Medicare Other

## 2020-10-12 ENCOUNTER — Telehealth (HOSPITAL_COMMUNITY): Payer: Self-pay | Admitting: Internal Medicine

## 2020-10-12 ENCOUNTER — Other Ambulatory Visit: Payer: Self-pay

## 2020-10-12 DIAGNOSIS — N3289 Other specified disorders of bladder: Secondary | ICD-10-CM | POA: Diagnosis not present

## 2020-10-12 DIAGNOSIS — N3000 Acute cystitis without hematuria: Secondary | ICD-10-CM | POA: Diagnosis not present

## 2020-10-12 MED ORDER — ATORVASTATIN CALCIUM 20 MG PO TABS: ORAL_TABLET | ORAL | 1 refills | Status: DC

## 2020-10-12 NOTE — Telephone Encounter (Signed)
Just an FYI. We have made several attempts to contact this patient including sending a letter to schedule or reschedule their echocardiogram. We will be removing the patient from the echo [place].  09/26/20 MAILED LETTER [facility]  09/26/20 called and VM is ful and unable to leave message @ 9:26/[facility]    Thank you

## 2020-10-12 NOTE — Telephone Encounter (Signed)
Will send this message to Dr. Debara Pickett and Primary covering RN as a general FYI.

## 2020-10-17 DIAGNOSIS — R8271 Bacteriuria: Secondary | ICD-10-CM | POA: Diagnosis not present

## 2020-10-17 DIAGNOSIS — N3289 Other specified disorders of bladder: Secondary | ICD-10-CM | POA: Diagnosis not present

## 2020-10-18 ENCOUNTER — Other Ambulatory Visit: Payer: Self-pay

## 2020-10-18 ENCOUNTER — Ambulatory Visit (HOSPITAL_COMMUNITY)
Admission: RE | Admit: 2020-10-18 | Discharge: 2020-10-18 | Disposition: A | Discharge status: Home / Self Care | Payer: Medicare Other | Source: Ambulatory Visit | Attending: Nephrology | Admitting: Nephrology

## 2020-10-18 VITALS — BP 119/59 | HR 75 | Temp 96.6°F | Resp 20

## 2020-10-18 DIAGNOSIS — E119 Type 2 diabetes mellitus without complications: Secondary | ICD-10-CM | POA: Diagnosis not present

## 2020-10-18 DIAGNOSIS — G9341 Metabolic encephalopathy: Secondary | ICD-10-CM | POA: Diagnosis not present

## 2020-10-18 DIAGNOSIS — N184 Chronic kidney disease, stage 4 (severe): Secondary | ICD-10-CM | POA: Insufficient documentation

## 2020-10-18 DIAGNOSIS — E1122 Type 2 diabetes mellitus with diabetic chronic kidney disease: Secondary | ICD-10-CM | POA: Insufficient documentation

## 2020-10-18 DIAGNOSIS — D696 Thrombocytopenia, unspecified: Secondary | ICD-10-CM | POA: Diagnosis not present

## 2020-10-18 LAB — IRON AND TIBC
Iron: 69 ug/dL (ref 28–170)
Saturation Ratios: 30 % (ref 10.4–31.8)
TIBC: 232 ug/dL — ABNORMAL LOW (ref 250–450)
UIBC: 163 ug/dL

## 2020-10-18 LAB — POCT HEMOGLOBIN-HEMACUE: Hemoglobin: 9.3 g/dL — ABNORMAL LOW (ref 12.0–15.0)

## 2020-10-18 LAB — FERRITIN: Ferritin: 241 ng/mL (ref 11–307)

## 2020-10-18 MED ORDER — EPOETIN ALFA-EPBX 10000 UNIT/ML IJ SOLN
INTRAMUSCULAR | Status: AC
  Filled 2020-10-18: qty 1

## 2020-10-18 MED ORDER — EPOETIN ALFA-EPBX 10000 UNIT/ML IJ SOLN
10000.0000 [IU] | INTRAMUSCULAR | Status: DC
  Administered 2020-10-18: 10000 [IU] via SUBCUTANEOUS

## 2020-10-23 ENCOUNTER — Ambulatory Visit: Payer: Medicare Other | Admitting: Rheumatology

## 2020-10-25 ENCOUNTER — Encounter (HOSPITAL_COMMUNITY): Payer: Medicare Other

## 2020-10-26 ENCOUNTER — Encounter: Payer: Self-pay | Admitting: Internal Medicine

## 2020-10-26 ENCOUNTER — Other Ambulatory Visit: Payer: Self-pay | Admitting: Internal Medicine

## 2020-10-29 ENCOUNTER — Other Ambulatory Visit: Payer: Self-pay | Admitting: Internal Medicine

## 2020-10-29 MED ORDER — METOPROLOL TARTRATE 50 MG PO TABS: 50.0000 mg | ORAL_TABLET | Freq: Two times a day (BID) | ORAL | 1 refills | Status: DC

## 2020-10-30 ENCOUNTER — Telehealth: Payer: Self-pay

## 2020-10-30 NOTE — Telephone Encounter (Signed)
I called and left the pt a message that Dr. Baird Cancer wanted to know if the magic mouthwash helped with the pt's mouth issues.

## 2020-10-30 NOTE — Telephone Encounter (Signed)
-----  Message from Glendale Chard, MD sent at 10/27/2020  8:30 PM EDT ----- Did magic mouthwash help with her mouth issue?

## 2020-11-06 ENCOUNTER — Encounter: Payer: Self-pay | Admitting: Internal Medicine

## 2020-11-06 ENCOUNTER — Other Ambulatory Visit: Payer: Self-pay | Admitting: Internal Medicine

## 2020-11-06 DIAGNOSIS — K143 Hypertrophy of tongue papillae: Secondary | ICD-10-CM

## 2020-11-12 DIAGNOSIS — N3000 Acute cystitis without hematuria: Secondary | ICD-10-CM | POA: Diagnosis not present

## 2020-11-12 DIAGNOSIS — R8271 Bacteriuria: Secondary | ICD-10-CM | POA: Diagnosis not present

## 2020-11-15 ENCOUNTER — Other Ambulatory Visit: Payer: Self-pay

## 2020-11-15 ENCOUNTER — Ambulatory Visit (HOSPITAL_COMMUNITY)
Admission: RE | Admit: 2020-11-15 | Discharge: 2020-11-15 | Disposition: A | Discharge status: Home / Self Care | Payer: Medicare Other | Source: Ambulatory Visit | Attending: Nephrology | Admitting: Nephrology

## 2020-11-15 DIAGNOSIS — E1122 Type 2 diabetes mellitus with diabetic chronic kidney disease: Secondary | ICD-10-CM | POA: Insufficient documentation

## 2020-11-15 DIAGNOSIS — N184 Chronic kidney disease, stage 4 (severe): Secondary | ICD-10-CM | POA: Diagnosis not present

## 2020-11-15 LAB — IRON AND TIBC
Iron: 89 ug/dL (ref 28–170)
Saturation Ratios: 37 % — ABNORMAL HIGH (ref 10.4–31.8)
TIBC: 242 ug/dL — ABNORMAL LOW (ref 250–450)
UIBC: 153 ug/dL

## 2020-11-15 LAB — FERRITIN: Ferritin: 220 ng/mL (ref 11–307)

## 2020-11-15 MED ORDER — EPOETIN ALFA-EPBX 10000 UNIT/ML IJ SOLN
INTRAMUSCULAR | Status: AC
  Filled 2020-11-15: qty 1

## 2020-11-15 MED ORDER — EPOETIN ALFA-EPBX 10000 UNIT/ML IJ SOLN
10000.0000 [IU] | INTRAMUSCULAR | Status: DC
  Administered 2020-11-15: 10000 [IU] via SUBCUTANEOUS

## 2020-11-17 DIAGNOSIS — D696 Thrombocytopenia, unspecified: Secondary | ICD-10-CM | POA: Diagnosis not present

## 2020-11-17 DIAGNOSIS — N184 Chronic kidney disease, stage 4 (severe): Secondary | ICD-10-CM | POA: Diagnosis not present

## 2020-11-17 DIAGNOSIS — G9341 Metabolic encephalopathy: Secondary | ICD-10-CM | POA: Diagnosis not present

## 2020-11-17 DIAGNOSIS — E119 Type 2 diabetes mellitus without complications: Secondary | ICD-10-CM | POA: Diagnosis not present

## 2020-11-21 LAB — POCT HEMOGLOBIN-HEMACUE: Hemoglobin: 9.6 g/dL — ABNORMAL LOW (ref 12.0–15.0)

## 2020-11-29 ENCOUNTER — Ambulatory Visit: Payer: Medicare Other | Admitting: Internal Medicine

## 2020-11-29 ENCOUNTER — Ambulatory Visit: Payer: Medicare Other

## 2020-12-06 ENCOUNTER — Encounter (HOSPITAL_COMMUNITY)
Admission: RE | Admit: 2020-12-06 | Discharge: 2020-12-06 | Disposition: A | Discharge status: Home / Self Care | Payer: Medicare Other | Source: Ambulatory Visit | Attending: Nephrology | Admitting: Nephrology

## 2020-12-06 ENCOUNTER — Other Ambulatory Visit: Payer: Self-pay

## 2020-12-06 ENCOUNTER — Telehealth: Payer: Self-pay | Admitting: *Deleted

## 2020-12-06 VITALS — BP 135/65 | HR 66 | Temp 96.9°F | Resp 20

## 2020-12-06 DIAGNOSIS — E1122 Type 2 diabetes mellitus with diabetic chronic kidney disease: Secondary | ICD-10-CM | POA: Insufficient documentation

## 2020-12-06 DIAGNOSIS — N184 Chronic kidney disease, stage 4 (severe): Secondary | ICD-10-CM | POA: Insufficient documentation

## 2020-12-06 LAB — POCT HEMOGLOBIN-HEMACUE: Hemoglobin: 10.7 g/dL — ABNORMAL LOW (ref 12.0–15.0)

## 2020-12-06 MED ORDER — EPOETIN ALFA-EPBX 10000 UNIT/ML IJ SOLN: 10000.0000 [IU] | INTRAMUSCULAR | Status: DC

## 2020-12-06 MED ORDER — EPOETIN ALFA-EPBX 10000 UNIT/ML IJ SOLN
INTRAMUSCULAR | Status: AC
  Administered 2020-12-06: 10000 [IU] via SUBCUTANEOUS
  Filled 2020-12-06: qty 1

## 2020-12-06 NOTE — Chronic Care Management (AMB) (Signed)
  Chronic Care Management   Outreach Note  12/06/2020 Name: Miranda Roman MRN: [id number] DOB: July 13, 1945  Miranda Roman is a 75 y.o. year old female who is a primary care patient of Glendale Chard, MD. I reached out to Miranda Roman by phone today in response to a referral sent by Ms. Miranda Roman's PCP, Dr.Sanders.     A telephone outreach was attempted today patient ask to call daughter  Miranda Roman to schedule call. A telephone outreach was attempted today to daughter Miranda Roman to schedule call with BSW she was not available. The patient was referred to the case management team for assistance with care management and care coordination.   Follow Up Plan: Contact information was provided to daughter Miranda Roman for a return call. The care management team will reach out to the patient again over the next 7 days. If patient returns call to provider office, please advise to call [facility] at [phone].  [facility]

## 2020-12-07 ENCOUNTER — Ambulatory Visit: Payer: Medicare Other | Admitting: Podiatry

## 2020-12-07 ENCOUNTER — Other Ambulatory Visit: Payer: Self-pay

## 2020-12-07 ENCOUNTER — Encounter: Payer: Self-pay | Admitting: Podiatry

## 2020-12-07 DIAGNOSIS — E1122 Type 2 diabetes mellitus with diabetic chronic kidney disease: Secondary | ICD-10-CM | POA: Diagnosis not present

## 2020-12-07 DIAGNOSIS — M79675 Pain in left toe(s): Secondary | ICD-10-CM | POA: Diagnosis not present

## 2020-12-07 DIAGNOSIS — Z794 Long term (current) use of insulin: Secondary | ICD-10-CM | POA: Insufficient documentation

## 2020-12-07 DIAGNOSIS — M79674 Pain in right toe(s): Secondary | ICD-10-CM | POA: Diagnosis not present

## 2020-12-07 DIAGNOSIS — B351 Tinea unguium: Secondary | ICD-10-CM

## 2020-12-07 DIAGNOSIS — I10 Essential (primary) hypertension: Secondary | ICD-10-CM | POA: Insufficient documentation

## 2020-12-07 DIAGNOSIS — D573 Sickle-cell trait: Secondary | ICD-10-CM | POA: Insufficient documentation

## 2020-12-07 DIAGNOSIS — N184 Chronic kidney disease, stage 4 (severe): Secondary | ICD-10-CM

## 2020-12-07 DIAGNOSIS — E1165 Type 2 diabetes mellitus with hyperglycemia: Secondary | ICD-10-CM | POA: Insufficient documentation

## 2020-12-07 HISTORY — DX: Sickle-cell trait: D57.3

## 2020-12-07 HISTORY — DX: Chronic kidney disease, stage 4 (severe): N18.4

## 2020-12-07 HISTORY — DX: Long term (current) use of insulin: Z79.4

## 2020-12-07 HISTORY — DX: Type 2 diabetes mellitus with hyperglycemia: E11.65

## 2020-12-07 NOTE — Progress Notes (Signed)
This patient returns to my office for at risk foot care.  This patient requires this care by a professional since this patient will be at risk due to having PAD and DM with kidney disease. This patient is unable to cut nails herself since the patient cannot reach her nails.These nails are painful walking and wearing shoes.  This patient presents for at risk foot care today.  General Appearance  Alert, conversant and in no acute stress.  Vascular  Dorsalis pedis and posterior tibial  pulses are palpable  bilaterally.  Capillary return is within normal limits  bilaterally. Temperature is within normal limits  bilaterally.  Neurologic  Senn-Weinstein monofilament wire test within normal limits  bilaterally. Muscle power within normal limits bilaterally.  Nails Thick disfigured discolored nails with subungual debris  from hallux to fifth toes bilaterally. No evidence of bacterial infection or drainage bilaterally.  Orthopedic  No limitations of motion  feet .  No crepitus or effusions noted.  No bony pathology or digital deformities noted.  Skin  normotropic skin with no porokeratosis noted bilaterally.  No signs of infections or ulcers noted.     Onychomycosis  Pain in right toes  Pain in left toes  Consent was obtained for treatment procedures.   Mechanical debridement of nails 1-5  bilaterally performed with a nail nipper.  Filed with dremel without incident.    Return office visit     3 months                 Told patient to return for periodic foot care and evaluation due to potential at risk complications.   Gardiner Barefoot DPM

## 2020-12-11 ENCOUNTER — Ambulatory Visit: Payer: Medicare Other | Admitting: Nurse Practitioner

## 2020-12-13 NOTE — Chronic Care Management (AMB) (Signed)
  Chronic Care Management   Outreach Note  12/13/2020 Name: BEILA PURDIE MRN: [id number] DOB: [birth year]  JOLANA RUNKLES is a 74 y.o. year old female who is a primary care patient of Glendale Chard, MD. I reached out to Sandy Salaam by phone today in response to a referral sent by Ms. Carrington Clamp Laforte's PCP, Dr. Baird Cancer.       A second unsuccessful telephone outreach was attempted today. The patient was referred to the case management team for assistance with care management and care coordination.   Follow Up Plan: The care management team will reach out to the patient again over the next 7 days. If patient returns call to provider office, please advise to call [facility] at [phone].  [place], [place] [address]

## 2020-12-15 NOTE — Progress Notes (Deleted)
Office Visit Note  Patient: Miranda Roman             Date of Birth: Jul 18, 1945           MRN: [id number]             PCP: Glendale Chard, MD Referring: Glendale Chard, MD Visit Date: 12/21/2020 Occupation: _0 @  Subjective:  No chief complaint on file.   History of Present Illness: Miranda Roman is a 75 y.o. female ***   Activities of Daily Living:  Patient reports morning stiffness for *** {minute/hour:19697}.   Patient {ACTIONS;DENIES/REPORTS:21021675::"Denies"} nocturnal pain.  Difficulty dressing/grooming: {ACTIONS;DENIES/REPORTS:21021675::"Denies"} Difficulty climbing stairs: {ACTIONS;DENIES/REPORTS:21021675::"Denies"} Difficulty getting out of chair: {ACTIONS;DENIES/REPORTS:21021675::"Denies"} Difficulty using hands for taps, buttons, cutlery, and/or writing: {ACTIONS;DENIES/REPORTS:21021675::"Denies"}  No Rheumatology ROS completed.   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   Pain in right hand 04/27/2020   Dysphagia, neurologic    History of back pain    Goals of care, counseling/discussion    Inadequate oral nutritional intake    Altered mental status    Palliative care by specialist    Pressure injury of skin 01/13/2020   Acute encephalopathy 01/13/2020   Fever of unknown origin 12/22/2019   Leukocytosis 12/22/2019   Elevated d-dimer 12/20/2019   PAD (peripheral artery disease) ([place]) 06/06/2019   Pancreatic cyst 06/06/2019   Class 1 obesity due to excess calories with serious comorbidity and body mass index (BMI) of 31.0 to 31.9 in adult 06/06/2019   Cyst of left kidney 03/10/2019   Lumbago 02/27/2019   Facet arthritis  of lumbar region 02/27/2019   Abnormal CT of the chest 12/28/2018   Atrophic vaginitis 12/15/2018   Atypical squamous cells of undetermined significance on cytologic smear of cervix (ASC-US) 12/15/2018   Overweight with body mass index (BMI) 25.0-29.9 12/15/2018   Low grade squamous intraepithelial lesion (LGSIL) on cervicovaginal cytologic smear 12/15/2018   Osteoporosis 12/15/2018   Unspecified dyspareunia (CODE) 12/15/2018   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 03/10/2012   Diabetes mellitus with stage 4 chronic kidney disease ([place]) 03/10/2012    Past Medical History:  Diagnosis Date   Anemia    Diabetes mellitus    Hyperlipidemia    Hypertension    Osteoporosis    Sickle cell trait ([place])    Thalassemia, beta ([place])     Family History  Problem Relation Age of Onset   Diabetes Mother    Heart disease Mother    Hypertension Mother    Stroke Mother    Diabetes Father    Heart disease Father    Diabetes Sister    Diabetes Brother    Hypertension Brother    Sickle cell anemia Brother    Diabetes Maternal Aunt    Diabetes Maternal Uncle    Diabetes Maternal Grandmother    Diabetes Maternal Grandfather    Sickle  cell anemia Sister    Sickle cell anemia Sister    Healthy Son    Healthy Son    Healthy Daughter    Past Surgical History:  Procedure Laterality Date   BONE RESECTION  01/2006   wrist   GASTRIC BYPASS  08/14/2009   OTHER SURGICAL HISTORY     sickle cell retinopathy laser surgery   TUBAL LIGATION  04/28/1977   Social History   Social History Narrative   Right handed   Lives with Daughter in one story    Immunization History  Administered Date(s) Administered   Fluad Quad(high Dose 65+) 06/11/2020   Influenza Split 04/06/2019   Influenza-Unspecified 03/16/2019   PFIZER(Purple Top)SARS-COV-2 Vaccination 07/29/2019, 08/19/2019, 05/04/2020   Pneumococcal Conjugate-13 06/10/2019   Pneumococcal Polysaccharide-23 07/09/2016     Objective: Vital Signs: There were no vitals taken for this visit.   Physical Exam   Musculoskeletal Exam:   CDAI Exam: CDAI Score: -- Patient Global: --; Provider Global: -- Swollen: --; Tender: -- Joint Exam 12/21/2020   No joint exam has been documented for this visit   There is currently no information documented on the homunculus. Go to the Rheumatology activity and complete the homunculus joint exam.  Investigation: No additional findings.  Imaging: No results found.  Recent Labs: Lab Results  Component Value Date   WBC 7.5 10/03/2020   HGB 10.7 (L) 12/06/2020   PLT 190 10/03/2020   NA 137 04/25/2020   K 3.7 04/25/2020   CL 106 04/25/2020   CO2 21 (L) 04/25/2020   GLUCOSE 123 (H) 04/25/2020   BUN 24 (H) 04/25/2020   CREATININE 1.91 (H) 04/25/2020   BILITOT 0.4 04/25/2020   ALKPHOS 60 04/25/2020   AST 13 (L) 04/25/2020   ALT 9 04/25/2020   PROT 6.4 10/03/2020   ALBUMIN 3.0 (L) 04/25/2020   CALCIUM 8.7 (L) 04/25/2020   GFRAA 24 (L) 04/09/2020   06/11/20: RF 42.1, ANA-, anti-CCP 7, ESR 19, uric acid 6.6.  Speciality Comments: No specialty comments available.  Procedures:  No procedures performed Allergies: Cefepime and Lactose intolerance (gi)   Assessment / Plan:     Visit Diagnoses: No diagnosis found.  Orders: No orders of the defined types were placed in this encounter.  No orders of the defined types were placed in this encounter.   Face-to-face time spent with patient was *** minutes. Greater than 50% of time was spent in counseling and coordination of care.  Follow-Up Instructions: No follow-ups on  file.   Bo Merino, MD  Note - This record has been created using Editor, commissioning.  Chart creation errors have been sought, but may not always  have been located. Such creation errors do not reflect on  the standard of medical care.

## 2020-12-17 NOTE — Telephone Encounter (Signed)
.  ccm

## 2020-12-18 DIAGNOSIS — D696 Thrombocytopenia, unspecified: Secondary | ICD-10-CM | POA: Diagnosis not present

## 2020-12-18 DIAGNOSIS — G9341 Metabolic encephalopathy: Secondary | ICD-10-CM | POA: Diagnosis not present

## 2020-12-18 DIAGNOSIS — N184 Chronic kidney disease, stage 4 (severe): Secondary | ICD-10-CM | POA: Diagnosis not present

## 2020-12-18 DIAGNOSIS — E119 Type 2 diabetes mellitus without complications: Secondary | ICD-10-CM | POA: Diagnosis not present

## 2020-12-19 DIAGNOSIS — H5213 Myopia, bilateral: Secondary | ICD-10-CM | POA: Diagnosis not present

## 2020-12-19 DIAGNOSIS — H524 Presbyopia: Secondary | ICD-10-CM | POA: Diagnosis not present

## 2020-12-19 DIAGNOSIS — H52203 Unspecified astigmatism, bilateral: Secondary | ICD-10-CM | POA: Diagnosis not present

## 2020-12-20 ENCOUNTER — Ambulatory Visit (INDEPENDENT_AMBULATORY_CARE_PROVIDER_SITE_OTHER): Payer: Medicare Other

## 2020-12-20 ENCOUNTER — Encounter (HOSPITAL_COMMUNITY)
Admission: RE | Admit: 2020-12-20 | Discharge: 2020-12-20 | Disposition: A | Discharge status: Home / Self Care | Payer: Medicare Other | Source: Ambulatory Visit | Attending: Nephrology | Admitting: Nephrology

## 2020-12-20 ENCOUNTER — Ambulatory Visit (INDEPENDENT_AMBULATORY_CARE_PROVIDER_SITE_OTHER): Payer: Medicare Other | Admitting: Nurse Practitioner

## 2020-12-20 ENCOUNTER — Other Ambulatory Visit: Payer: Self-pay

## 2020-12-20 VITALS — BP 126/58 | HR 75 | Temp 98.2°F | Ht 64.5 in | Wt 164.2 lb

## 2020-12-20 VITALS — BP 123/63 | HR 60 | Temp 97.1°F | Resp 20

## 2020-12-20 VITALS — BP 126/58 | HR 75 | Temp 98.2°F | Ht 64.5 in | Wt 164.3 lb

## 2020-12-20 DIAGNOSIS — I129 Hypertensive chronic kidney disease with stage 1 through stage 4 chronic kidney disease, or unspecified chronic kidney disease: Secondary | ICD-10-CM | POA: Diagnosis not present

## 2020-12-20 DIAGNOSIS — N184 Chronic kidney disease, stage 4 (severe): Secondary | ICD-10-CM

## 2020-12-20 DIAGNOSIS — R82998 Other abnormal findings in urine: Secondary | ICD-10-CM | POA: Diagnosis not present

## 2020-12-20 DIAGNOSIS — E663 Overweight: Secondary | ICD-10-CM

## 2020-12-20 DIAGNOSIS — R300 Dysuria: Secondary | ICD-10-CM | POA: Diagnosis not present

## 2020-12-20 DIAGNOSIS — E1122 Type 2 diabetes mellitus with diabetic chronic kidney disease: Secondary | ICD-10-CM | POA: Diagnosis not present

## 2020-12-20 DIAGNOSIS — I2510 Atherosclerotic heart disease of native coronary artery without angina pectoris: Secondary | ICD-10-CM | POA: Diagnosis not present

## 2020-12-20 DIAGNOSIS — Z6827 Body mass index (BMI) 27.0-27.9, adult: Secondary | ICD-10-CM

## 2020-12-20 DIAGNOSIS — Z0000 Encounter for general adult medical examination without abnormal findings: Secondary | ICD-10-CM | POA: Diagnosis not present

## 2020-12-20 LAB — POCT URINALYSIS DIPSTICK
Bilirubin, UA: NEGATIVE
Glucose, UA: NEGATIVE
Ketones, UA: NEGATIVE
Nitrite, UA: POSITIVE
Protein, UA: POSITIVE — AB
Spec Grav, UA: 1.02 (ref 1.010–1.025)
Urobilinogen, UA: 0.2 E.U./dL
pH, UA: 5.5 (ref 5.0–8.0)

## 2020-12-20 LAB — IRON AND TIBC
Iron: 94 ug/dL (ref 28–170)
Saturation Ratios: 39 % — ABNORMAL HIGH (ref 10.4–31.8)
TIBC: 242 ug/dL — ABNORMAL LOW (ref 250–450)
UIBC: 148 ug/dL

## 2020-12-20 LAB — POCT HEMOGLOBIN-HEMACUE: Hemoglobin: 10.4 g/dL — ABNORMAL LOW (ref 12.0–15.0)

## 2020-12-20 LAB — FERRITIN: Ferritin: 155 ng/mL (ref 11–307)

## 2020-12-20 MED ORDER — EPOETIN ALFA-EPBX 10000 UNIT/ML IJ SOLN
INTRAMUSCULAR | Status: AC
  Administered 2020-12-20: 10000 [IU] via SUBCUTANEOUS
  Filled 2020-12-20: qty 1

## 2020-12-20 MED ORDER — NITROFURANTOIN MONOHYD MACRO 100 MG PO CAPS: 100.0000 mg | ORAL_CAPSULE | Freq: Two times a day (BID) | ORAL | 0 refills | Status: AC

## 2020-12-20 MED ORDER — EPOETIN ALFA-EPBX 10000 UNIT/ML IJ SOLN: 10000.0000 [IU] | INTRAMUSCULAR | Status: DC

## 2020-12-20 NOTE — Progress Notes (Signed)
This visit occurred during the SARS-CoV-2 public health emergency.  Safety protocols were in place, including screening questions prior to the visit, additional usage of staff PPE, and extensive cleaning of exam room while observing appropriate contact time as indicated for disinfecting solutions.  Subjective:   LATORIA DRY is a 75 y.o. female who presents for Medicare Annual (Subsequent) preventive examination.  Review of Systems     Cardiac Risk Factors include: advanced age (>53mn, >>31women);diabetes mellitus;hypertension     Objective:    Today's Vitals   12/20/20 1038  BP: (!) 126/58  Pulse: 75  Temp: 98.2 F (36.8 C)  TempSrc: Oral  Weight: 164 lb 4.8 oz (74.5 kg)  Height: 5' 4.5" (1.638 m)   Body mass index is 27.77 kg/m.  Advanced Directives 12/20/2020 04/04/2020 01/13/2020 09/28/2019 02/28/2019 02/26/2019 09/16/2018  Does Patient Have a Medical Advance Directive? Yes Yes Unable to assess, patient is non-responsive or altered mental status Yes Yes Yes No  Type of Advance Directive [place] will Healthcare Power of [place] will -  Does patient want to make changes to medical advance directive? - - - - No - Patient declined - -  Copy of [place] Chart? No - copy requested - - No - copy requested - - -  Would patient like information on creating a medical advance directive? - - - - - - No - Patient declined    Current Medications (verified) Outpatient Encounter Medications as of 12/20/2020  Medication Sig   acetaminophen (TYLENOL) 500 MG tablet Take 1 tablet (500 mg total) by mouth every 6 (six) hours as needed for moderate pain or fever.   amLODipine (NORVASC) 10 MG tablet 1 tablet   amLODipine (NORVASC) 2.5 MG tablet Take 2.5 mg by mouth daily.   atorvastatin (LIPITOR) 20 MG tablet Take one tablet by mouth M-F and skip weekends   BD INSULIN  SYRINGE U/F 31G X 5/16" 0.3 ML MISC See admin instructions. use with insulin   calcitRIOL (ROCALTROL) 0.25 MCG capsule Take 0.25 mcg by mouth 3 (three) times a week.   cetirizine (ZYRTEC) 10 MG tablet Take 10 mg by mouth daily.   Cyanocobalamin (VITAMIN B12) 1000 MCG TBCR 1 tablet   diclofenac Sodium (VOLTAREN) 1 % GEL Apply 2 g topically 4 (four) times daily. Apply to left knee.   epoetin alfa (PROCRIT) 186381UNIT/ML injection    estradiol (ESTRACE) 0.1 MG/GM vaginal cream PLEASE SEE ATTACHED FOR DETAILED DIRECTIONS   gabapentin (NEURONTIN) 100 MG capsule One capsule po qhs   HUMALOG 100 UNIT/ML injection Inject 4 Unit(s) SUB-Q 3 Times Daily   insulin detemir (LEVEMIR FLEXTOUCH) 100 UNIT/ML FlexPen 6 Units.   Insulin Pen Needle (BD PEN NEEDLE NANO U/F) 32G X 4 MM MISC Use as directed with insulin pen   metoprolol tartrate (LOPRESSOR) 50 MG tablet Take 1 tablet (50 mg total) by mouth 2 (two) times daily.   Multiple Vitamins-Minerals (CENTRUM SILVER PO) Take by mouth daily.   sodium bicarbonate 650 MG tablet Take 650 mg by mouth daily.   thiamine 100 MG tablet Take 1 tablet (100 mg total) by mouth daily.   acyclovir (ZOVIRAX) 200 MG capsule Take 200 mg by mouth 3 (three) times daily. (Patient not taking: Reported on 12/20/2020)   Continuous Blood Gluc Receiver (FREESTYLE LIBRE 14 DAY READER) DEVI Use as directed to check blood sugars 4 times per day dx:  e11.65 (Patient not taking: Reported on 12/20/2020)   Continuous Blood Gluc Sensor (FREESTYLE LIBRE 14 DAY SENSOR) MISC Use as directed to check blood sugars 4 times per day dx: e11.65 (Patient not taking: Reported on 12/20/2020)   Facility-Administered Encounter Medications as of 12/20/2020  Medication   epoetin alfa-epbx (RETACRIT) injection 10,000 Units    Allergies (verified) Cefepime and Lactose intolerance (gi)   History: Past Medical History:  Diagnosis Date   Anemia    Diabetes mellitus    Hyperlipidemia    Hypertension     Osteoporosis    Sickle cell trait ([facility])    Thalassemia, beta ([place])    Past Surgical History:  Procedure Laterality Date   BONE RESECTION  01/2006   wrist   GASTRIC BYPASS  08/14/2009   OTHER SURGICAL HISTORY     sickle cell retinopathy laser surgery   TUBAL LIGATION  04/28/1977   Family History  Problem Relation Age of Onset   Diabetes Mother    Heart disease Mother    Hypertension Mother    Stroke Mother    Diabetes Father    Heart disease Father    Diabetes Sister    Diabetes Brother    Hypertension Brother    Sickle cell anemia Brother    Diabetes Maternal Aunt    Diabetes Maternal Uncle    Diabetes Maternal Grandmother    Diabetes Maternal Grandfather    Sickle cell anemia Sister    Sickle cell anemia Sister    Healthy Son    Healthy Son    Healthy Daughter    Social History   Socioeconomic History   Marital status: Widowed    Spouse name: Not on file   Number of children: Not on file   Years of education: Not on file   Highest education level: Not on file  Occupational History   Occupation: retired  Tobacco Use   Smoking status: Former    Packs/day: 0.25    Years: 43.00    Pack years: 10.75    Types: Cigarettes    Start date: 04/1962    Quit date: 04/2005    Years since quitting: 15.7   Smokeless tobacco: Never  Vaping Use   Vaping Use: Never used  Substance and Sexual Activity   Alcohol use: Not Currently    Comment: occasionally   Drug use: No   Sexual activity: Not Currently  Other Topics Concern   Not on file  Social History Narrative   Right handed   Lives with Daughter in one story   Social Determinants of Health   Financial Resource Strain: Low Risk    Difficulty of Paying Living Expenses: Not hard at all  Food Insecurity: No Food Insecurity   Worried About Charity fundraiser in the Last Year: Never true   [place] in the Last Year: Never true  Transportation Needs: No Transportation Needs   Lack of Transportation  (Medical): No   Lack of Transportation (Non-Medical): No  Physical Activity: Insufficiently Active   Days of Exercise per Week: 2 days   Minutes of Exercise per Session: 30 min  Stress: No Stress Concern Present   Feeling of Stress : Not at all  Social Connections: Not on file    Tobacco Counseling Counseling given: Not Answered   Clinical Intake:  Pre-visit preparation completed: Yes  Pain : No/denies pain     Nutritional Status: BMI 25 -29 Overweight Nutritional Risks: None Diabetes: Yes  How often  do you need to have someone help you when you read instructions, pamphlets, or other written materials from your doctor or pharmacy?: 1 - Never What is the last grade level you completed in school?: college  Diabetic?yes Nutrition Risk Assessment:  Has the patient had any N/V/D within the last 2 months?  No  Does the patient have any non-healing wounds?  No  Has the patient had any unintentional weight loss or weight gain?  Yes   Diabetes:  Is the patient diabetic?  Yes  If diabetic, was a CBG obtained today?  No  Did the patient bring in their glucometer from home?  No  How often do you monitor your CBG's? daily.   Financial Strains and Diabetes Management:  Are you having any financial strains with the device, your supplies or your medication? No .  Does the patient want to be seen by Chronic Care Management for management of their diabetes?  No  Would the patient like to be referred to a Nutritionist or for Diabetic Management?  No   Diabetic Exams:  Diabetic Eye Exam: Completed 03/20/2020 Diabetic Foot Exam: Completed 10/03/2020   Interpreter Needed?: No  Information entered by :: NAllen LPN   Activities of Daily Living In your present state of health, do you have any difficulty performing the following activities: 12/20/2020 01/13/2020  Hearing? N N  Vision? Y N  Difficulty concentrating or making decisions? Tempie Donning  Walking or climbing stairs? Y Y  Dressing  or bathing? Y Y  Doing errands, shopping? Tempie Donning  Preparing Food and eating ? Y -  Using the Toilet? Y -  In the past six months, have you accidently leaked urine? Y -  Do you have problems with loss of bowel control? Y -  Managing your Medications? Y -  Managing your Finances? Y -  Housekeeping or managing your Housekeeping? Y -  Some recent data might be hidden    Patient Care Team: Glendale Chard, MD as PCP - General (Internal Medicine) Foye Spurling, MD (Inactive) as Consulting Physician (Endocrinology) Pixie Casino, MD as Consulting Physician (Cardiology) [name], MD as Consulting Physician (General Surgery) Rutherford Guys, MD as Consulting Physician (Ophthalmology)  Indicate any recent Medical Services you may have received from other than [facility] providers in the past year (date may be approximate).     Assessment:   This is a routine wellness examination for Nellene.  Hearing/Vision screen Vision Screening - Comments:: Regular eye exams, Dr. Gershon Crane  Dietary issues and exercise activities discussed: Current Exercise Habits: Home exercise routine, Type of exercise: stretching;strength training/weights, Time (Minutes): 30, Frequency (Times/Week): 2, Weekly Exercise (Minutes/Week): 60   Goals Addressed             This Visit's Progress    Patient Stated       12/20/2020, wants to weigh 140 pounds, recover eyesight        Depression Screen PHQ 2/9 Scores 12/20/2020 10/03/2020 09/28/2019 03/03/2019 09/16/2018 08/24/2018 08/04/2018  PHQ - 2 Score 0 0 0 0 0 0 0  PHQ- 9 Score - - 0 - 0 - -    Fall Risk Fall Risk  12/20/2020 10/03/2020 09/28/2019 03/03/2019 09/16/2018  Falls in the past year? 1 1 0 0 0  Comment loses balance - - - -  Number falls in past yr: 1 1 - - -  Injury with Fall? 0 1 - - -  Risk for fall due to : Impaired balance/gait;Impaired mobility;Medication side  effect - Medication side effect - Medication side effect  Follow up Falls evaluation  completed;Education provided;Falls prevention discussed - Falls evaluation completed;Education provided;Falls prevention discussed - Falls prevention discussed;Education provided    FALL RISK PREVENTION PERTAINING TO THE HOME:  Any stairs in or around the home? Yes  If so, are there any without handrails? Yes  Home free of loose throw rugs in walkways, pet beds, electrical cords, etc? Yes  Adequate lighting in your home to reduce risk of falls? Yes   ASSISTIVE DEVICES UTILIZED TO PREVENT FALLS:  Life alert? No  Use of a cane, walker or w/c? Yes  Grab bars in the bathroom? Yes  Shower chair or bench in shower? Yes  Elevated toilet seat or a handicapped toilet? Yes   TIMED UP AND GO:  Was the test performed? No .    Gait slow and steady with assistive device  Cognitive Function:     6CIT Screen 12/20/2020 09/28/2019 09/16/2018  What Year? 0 points 0 points 0 points  What month? 0 points 0 points 0 points  What time? 0 points 0 points 0 points  Count back from 20 0 points 0 points 0 points  Months in reverse 0 points 0 points 0 points  Repeat phrase 2 points 0 points 0 points  Total Score 2 0 0    Immunizations Immunization History  Administered Date(s) Administered   Fluad Quad(high Dose 65+) 06/11/2020   Influenza Split 04/06/2019   Influenza-Unspecified 03/16/2019   PFIZER(Purple Top)SARS-COV-2 Vaccination 07/29/2019, 08/19/2019, 05/04/2020   Pneumococcal Conjugate-13 06/10/2019   Pneumococcal Polysaccharide-23 07/09/2016    TDAP status: Up to date  Flu Vaccine status: Up to date  Pneumococcal vaccine status: Up to date  Covid-19 vaccine status: Completed vaccines  Qualifies for Shingles Vaccine? Yes   Zostavax completed Yes   Shingrix Completed?: No.    Education has been provided regarding the importance of this vaccine. Patient has been advised to call insurance company to determine out of pocket expense if they have not yet received this vaccine. Advised  may also receive vaccine at local pharmacy or Health Dept. Verbalized acceptance and understanding.  Screening Tests Health Maintenance  Topic Date Due   Zoster Vaccines- Shingrix (1 of 2) Never done   COVID-19 Vaccine (4 - Booster for Pfizer series) 08/04/2020   MAMMOGRAM  02/03/2021   INFLUENZA VACCINE  02/04/2021   OPHTHALMOLOGY EXAM  03/20/2021   HEMOGLOBIN A1C  04/05/2021   FOOT EXAM  10/03/2021   URINE MICROALBUMIN  10/03/2021   COLONOSCOPY (Pts 45-29yr Insurance coverage will need to be confirmed)  01/19/2022   TETANUS/TDAP  04/08/2023   DEXA SCAN  Completed   Hepatitis C Screening  Completed   PNA vac Low Risk Adult  Completed   HPV VACCINES  Aged Out    Health Maintenance  Health Maintenance Due  Topic Date Due   Zoster Vaccines- Shingrix (1 of 2) Never done   COVID-19 Vaccine (4 - Booster for Pfizer series) 08/04/2020    Colorectal cancer screening: Type of screening: Colonoscopy. Completed 01/20/2012. Repeat every 10 years  Mammogram status: Completed 02/04/2019. Repeat every year  Bone Density status: Completed 04/06/2019.   Lung Cancer Screening: (Low Dose CT Chest recommended if Age 74-80years, 30 pack-year currently smoking OR have quit w/in 15years.) does not qualify.   Lung Cancer Screening Referral: no  Additional Screening:  Hepatitis C Screening: does qualify; Completed 02/08/2020  Vision Screening: Recommended annual ophthalmology exams for early detection of  glaucoma and other disorders of the eye. Is the patient up to date with their annual eye exam?  Yes  Who is the provider or what is the name of the office in which the patient attends annual eye exams? Dr. Gershon Crane If pt is not established with a provider, would they like to be referred to a provider to establish care? No .   Dental Screening: Recommended annual dental exams for proper oral hygiene  Community Resource Referral / Chronic Care Management: CRR required this visit?  No   CCM  required this visit?  No      Plan:     I have personally reviewed and noted the following in the patient's chart:   Medical and social history Use of alcohol, tobacco or illicit drugs  Current medications and supplements including opioid prescriptions.  Functional ability and status Nutritional status Physical activity Advanced directives List of other physicians Hospitalizations, surgeries, and ER visits in previous 12 months Vitals Screenings to include cognitive, depression, and falls Referrals and appointments  In addition, I have reviewed and discussed with patient certain preventive protocols, quality metrics, and best practice recommendations. A written personalized care plan for preventive services as well as general preventive health recommendations were provided to patient.     Kellie Simmering, LPN   [id number]   Nurse Notes:

## 2020-12-20 NOTE — Progress Notes (Signed)
I,[name],acting as a Education administrator for [employer], NP.,have documented all relevant documentation on the behalf of [employer], NP,as directed by  Bary Castilla, NP while in the presence of Bary Castilla, NP.  This visit occurred during the SARS-CoV-2 public health emergency.  Safety protocols were in place, including screening questions prior to the visit, additional usage of staff PPE, and extensive cleaning of exam room while observing appropriate contact time as indicated for disinfecting solutions.  Subjective:     Patient ID: Miranda Roman , female    DOB: [birth year] , 75 y.o.   MRN: [id number]   Chief Complaint  Patient presents with   Diabetes   Hypertension    HPI  Patient is here today for diabetes check. She states that she is compliant with medications. Her grand-daughter is here with her today.  She sees the eye doctor, nephrologist, endocrinologist. Checks her BS every morning and runs 90's.   Diet: She eats 3 times a day. She drinks a lot water and snacks. She lives at her house but her family helps out.  Exercise: She walks around her house. The familiy wants to make sure she doest have a UTI   Diabetes She presents for her follow-up diabetic visit. She has type 2 diabetes mellitus. Her disease course has been stable. There are no hypoglycemic associated symptoms. Pertinent negatives for hypoglycemia include no dizziness. There are no diabetic associated symptoms. Pertinent negatives for diabetes include no blurred vision, no chest pain, no polydipsia, no polyphagia, no polyuria and no weakness. There are no hypoglycemic complications. Diabetic complications include nephropathy. Risk factors for coronary artery disease include diabetes mellitus, dyslipidemia, hypertension, sedentary lifestyle and post-menopausal. Current diabetic treatment includes insulin injections. She is compliant with treatment most of the time. She is following a diabetic diet.  Meal planning includes avoidance of concentrated sweets.  Hypertension This is a chronic problem. The current episode started more than 1 year ago. The problem has been gradually improving since onset. Pertinent negatives include no blurred vision, chest pain, palpitations or shortness of breath. The current treatment provides moderate improvement.    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[place])      Family History  Problem Relation Age of Onset   Diabetes Mother    Heart disease Mother    Hypertension Mother    Stroke Mother    Diabetes Father    Heart disease Father    Diabetes Sister    Diabetes Brother    Hypertension Brother    Sickle cell anemia Brother    Diabetes Maternal Aunt    Diabetes Maternal Uncle    Diabetes Maternal Grandmother    Diabetes Maternal Grandfather    Sickle cell anemia Sister    Sickle cell anemia Sister    Healthy Son    Healthy Son    Healthy Daughter      Current Outpatient Medications:    acetaminophen (TYLENOL) 500 MG tablet, Take 1 tablet (500 mg total) by mouth every 6 (six) hours as needed for moderate pain or fever., Disp: 30 tablet, Rfl: 0   acyclovir (ZOVIRAX) 200 MG capsule, Take 200 mg by mouth 3 (three) times daily. (Patient not taking: Reported on 12/20/2020), Disp: , Rfl:    amLODipine (NORVASC) 10 MG tablet, 1 tablet, Disp: , Rfl:  amLODipine (NORVASC) 2.5 MG tablet, Take 2.5 mg by mouth daily., Disp: , Rfl:    atorvastatin (LIPITOR) 20 MG tablet, Take one tablet by mouth M-F and skip weekends, Disp: 75 tablet, Rfl: 1   BD INSULIN SYRINGE U/F 31G X 5/16" 0.3 ML MISC, See admin instructions. use with insulin, Disp: , Rfl:    calcitRIOL (ROCALTROL) 0.25 MCG capsule, Take 0.25 mcg by mouth 3 (three) times a week., Disp: , Rfl:    cetirizine (ZYRTEC) 10 MG tablet, Take 10 mg by mouth daily., Disp: ,  Rfl:    Continuous Blood Gluc Receiver (FREESTYLE LIBRE 14 DAY READER) DEVI, Use as directed to check blood sugars 4 times per day dx: e11.65 (Patient not taking: Reported on 12/20/2020), Disp: 1 each, Rfl: 3   Continuous Blood Gluc Sensor (FREESTYLE LIBRE 14 DAY SENSOR) MISC, Use as directed to check blood sugars 4 times per day dx: e11.65 (Patient not taking: Reported on 12/20/2020), Disp: 2 each, Rfl: 3   Cyanocobalamin (VITAMIN B12) 1000 MCG TBCR, 1 tablet, Disp: , Rfl:    diclofenac Sodium (VOLTAREN) 1 % GEL, Apply 2 g topically 4 (four) times daily. Apply to left knee., Disp: 50 g, Rfl: 0   epoetin alfa (PROCRIT) 92330 UNIT/ML injection, , Disp: , Rfl:    estradiol (ESTRACE) 0.1 MG/GM vaginal cream, PLEASE SEE ATTACHED FOR DETAILED DIRECTIONS, Disp: , Rfl:    gabapentin (NEURONTIN) 100 MG capsule, One capsule po qhs, Disp: 30 capsule, Rfl: 2   HUMALOG 100 UNIT/ML injection, Inject 4 Unit(s) SUB-Q 3 Times Daily, Disp: 10 mL, Rfl: 1   insulin detemir (LEVEMIR FLEXTOUCH) 100 UNIT/ML FlexPen, 6 Units., Disp: , Rfl:    Insulin Pen Needle (BD PEN NEEDLE NANO U/F) 32G X 4 MM MISC, Use as directed with insulin pen, Disp: 150 each, Rfl: 2   metoprolol tartrate (LOPRESSOR) 50 MG tablet, Take 1 tablet (50 mg total) by mouth 2 (two) times daily., Disp: 90 tablet, Rfl: 1   Multiple Vitamins-Minerals (CENTRUM SILVER PO), Take by mouth daily., Disp: , Rfl:    sodium bicarbonate 650 MG tablet, Take 650 mg by mouth daily., Disp: , Rfl:    thiamine 100 MG tablet, Take 1 tablet (100 mg total) by mouth daily., Disp: , Rfl:  No current facility-administered medications for this visit.  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  Negative for chills and fever.  HENT:  Negative for congestion and sinus pain.   Eyes:  Negative for blurred vision and pain.  Respiratory: Negative.  Negative for cough, choking, chest tightness, shortness of breath and wheezing.   Cardiovascular: Negative.  Negative for chest pain and palpitations.  Gastrointestinal: Negative.  Negative for constipation and diarrhea.  Endocrine: Negative for polydipsia, polyphagia and polyuria.  Genitourinary:  Positive for frequency. Negative for flank pain, hematuria and urgency.  Musculoskeletal:  Negative for arthralgias and myalgias.       Uses a cane and walker.   Neurological: Negative.  Negative for dizziness, weakness and numbness.  Psychiatric/Behavioral:  Negative for dysphoric mood.     Today's Vitals  12/20/20 1035  BP: (!) 126/58  Pulse: 75  Temp: 98.2 F (36.8 C)  TempSrc: Oral  Weight: 164 lb 3.2 oz (74.5 kg)  Height: 5' 4.5" (1.638 m)   Body mass index is 27.75 kg/m.  Wt Readings from Last 3 Encounters:  12/20/20 164 lb 4.8 oz (74.5 kg)  12/20/20 164 lb 3.2 oz (74.5 kg)  10/03/20 162 lb 6.4 oz (73.7 kg)    Objective:  Physical Exam Constitutional:      Appearance: Normal appearance.  HENT:     Head: Normocephalic and atraumatic.  Cardiovascular:     Rate and Rhythm: Normal rate and regular rhythm.     Pulses: Normal pulses.     Heart sounds: Normal heart sounds. No murmur heard. Pulmonary:     Effort: Pulmonary effort is normal. No respiratory distress.     Breath sounds: Normal breath sounds. No wheezing.  Skin:    General: Skin is warm and dry.     Capillary Refill: Capillary refill takes less than 2 seconds.  Neurological:     Mental Status: She is alert.        Assessment And Plan:     1. 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 Discussed with the patient the importance of compliance with home glucose monitoring, diet which includes decrease amount of sugary drinks and foods. Importance of exercise was also discussed with the patient. Importance of eye exams, self foot care and compliance to office visits was also discussed with the patient.  -Chronic, stable  -Continue being followed by endocrinologist  - Hemoglobin A1c  2. Hypertensive nephropathy Chronic, controlled  -Limit the intake of processed foods and salt intake. You should increase your intake of green vegetables and fruits. Limit the use of alcohol. Limit fast foods and fried foods. Avoid high fatty saturated and trans fat foods. Keep yourself hydrated with drinking water. Avoid red meats. Eat lean meats instead. Exercise for atleast 30-45 min for atleast 4-5 times a week.  - CBC no Diff - CMP14+EGFR  3. Atherosclerosis of native coronary artery of native heart without angina pectoris --Chronic, stable  -Continue current meds, tolerating well.  -Encouraged to continue with a low fat diet avoiding fast food and fried food.  - Lipid panel  4. Dysuria - POCT Urinalysis Dipstick (74163) -will check for UTI  5. Leukocytes in urine -Urine culture  -Will treat her with Macrobid BID for 14 days.   5. Overweight with body mass index (BMI) of 27 to 27.9 in adult Advised patient on a healthy diet including avoiding fast food and red meats. Increase the intake of lean meats including grilled chicken and [place].  Drink a lot of water. Decrease intake of fatty foods. Exercise for 30-45 min. 4-5 a week to decrease the risk of cardiac event.   The patient was encouraged to call or send a message through [place] for any questions or concerns.   Side effects and appropriate use of all the medication(s) were discussed with the patient today. Patient advised to use the medication(s) as directed by  their healthcare provider. The patient was encouraged to read, review, and understand all associated package inserts and contact our office with any questions or concerns. The patient accepts the risks of the treatment plan and had an opportunity to ask questions.   Patient was given opportunity to ask questions. Patient verbalized understanding of  the plan and was able to repeat key elements of the plan. All questions were answered to their satisfaction.  Raman Cayton Cuevas, DNP   I, Raman Cleota Pellerito have reviewed all documentation for this visit. The documentation on 12/20/20 for the exam, diagnosis, procedures, and orders are all accurate and complete.     IF YOU HAVE BEEN REFERRED TO A SPECIALIST, IT MAY TAKE 1-2 WEEKS TO SCHEDULE/PROCESS THE REFERRAL. IF YOU HAVE NOT HEARD FROM US/SPECIALIST IN TWO WEEKS, PLEASE GIVE [place] A CALL AT [phone].   THE PATIENT IS ENCOURAGED TO PRACTICE SOCIAL DISTANCING DUE TO THE COVID-19 PANDEMIC.

## 2020-12-20 NOTE — Patient Instructions (Signed)
Miranda Roman , Thank you for taking time to come for your Medicare Wellness Visit. I appreciate your ongoing commitment to your health goals. Please review the following plan we discussed and let me know if I can assist you in the future.   Screening recommendations/referrals: Colonoscopy: completed 01/20/2012 Mammogram: completed 02/04/2019 Bone Density: completed 04/06/2019 Recommended yearly ophthalmology/optometry visit for glaucoma screening and checkup Recommended yearly dental visit for hygiene and checkup  Vaccinations: Influenza vaccine: completed 06/11/2020, due 02/04/2021 Pneumococcal vaccine: completed 06/10/2019 Tdap vaccine: completed 04/07/2013, due 04/08/2023 Shingles vaccine: discussed   Covid-19: 05/04/2020, 08/19/2019, 07/29/2019  Advanced directives: Please bring a copy of your POA (Power of Attorney) and/or Living Will to your next appointment.   Conditions/risks identified: none  Next appointment: Follow up in one year for your annual wellness visit    Preventive Care 65 Years and Older, Female Preventive care refers to lifestyle choices and visits with your health care provider that can promote health and wellness. What does preventive care include? A yearly physical exam. This is also called an annual well check. Dental exams once or twice a year. Routine eye exams. Ask your health care provider how often you should have your eyes checked. Personal lifestyle choices, including: Daily care of your teeth and gums. Regular physical activity. Eating a healthy diet. Avoiding tobacco and drug use. Limiting alcohol use. Practicing safe sex. Taking low-dose aspirin every day. Taking vitamin and mineral supplements as recommended by your health care provider. What happens during an annual well check? The services and screenings done by your health care provider during your annual well check will depend on your age, overall health, lifestyle risk factors, and family history  of disease. Counseling  Your health care provider may ask you questions about your: Alcohol use. Tobacco use. Drug use. Emotional well-being. Home and relationship well-being. Sexual activity. Eating habits. History of falls. Memory and ability to understand (cognition). Work and work Statistician. Reproductive health. Screening  You may have the following tests or measurements: Height, weight, and BMI. Blood pressure. Lipid and cholesterol levels. These may be checked every 5 years, or more frequently if you are over 77 years old. Skin check. Lung cancer screening. You may have this screening every year starting at age 36 if you have a 30-pack-year history of smoking and currently smoke or have quit within the past 15 years. Fecal occult blood test (FOBT) of the stool. You may have this test every year starting at age 21. Flexible sigmoidoscopy or colonoscopy. You may have a sigmoidoscopy every 5 years or a colonoscopy every 10 years starting at age 58. Hepatitis C blood test. Hepatitis B blood test. Sexually transmitted disease (STD) testing. Diabetes screening. This is done by checking your blood sugar (glucose) after you have not eaten for a while (fasting). You may have this done every 1-3 years. Bone density scan. This is done to screen for osteoporosis. You may have this done starting at age 56. Mammogram. This may be done every 1-2 years. Talk to your health care provider about how often you should have regular mammograms. Talk with your health care provider about your test results, treatment options, and if necessary, the need for more tests. Vaccines  Your health care provider may recommend certain vaccines, such as: Influenza vaccine. This is recommended every year. Tetanus, diphtheria, and acellular pertussis (Tdap, Td) vaccine. You may need a Td booster every 10 years. Zoster vaccine. You may need this after age 28. Pneumococcal 13-valent conjugate (PCV13) vaccine.  One  dose is recommended after age 6. Pneumococcal polysaccharide (PPSV23) vaccine. One dose is recommended after age 77. Talk to your health care provider about which screenings and vaccines you need and how often you need them. This information is not intended to replace advice given to you by your health care provider. Make sure you discuss any questions you have with your health care provider. Document Released: 07/20/2015 Document Revised: 03/12/2016 Document Reviewed: 04/24/2015 Elsevier Interactive Patient Education  2017 [place] Prevention in the Home Falls can cause injuries. They can happen to people of all ages. There are many things you can do to make your home safe and to help prevent falls. What can I do on the outside of my home? Regularly fix the edges of walkways and driveways and fix any cracks. Remove anything that might make you trip as you walk through a door, such as a raised step or threshold. Trim any bushes or trees on the path to your home. Use bright outdoor lighting. Clear any walking paths of anything that might make someone trip, such as rocks or tools. Regularly check to see if handrails are loose or broken. Make sure that both sides of any steps have handrails. Any raised decks and porches should have guardrails on the edges. Have any leaves, snow, or ice cleared regularly. Use sand or salt on walking paths during winter. Clean up any spills in your garage right away. This includes oil or grease spills. What can I do in the bathroom? Use night lights. Install grab bars by the toilet and in the tub and shower. Do not use towel bars as grab bars. Use non-skid mats or decals in the tub or shower. If you need to sit down in the shower, use a plastic, non-slip stool. Keep the floor dry. Clean up any water that spills on the floor as soon as it happens. Remove soap buildup in the tub or shower regularly. Attach bath mats securely with double-sided  non-slip rug tape. Do not have throw rugs and other things on the floor that can make you trip. What can I do in the bedroom? Use night lights. Make sure that you have a light by your bed that is easy to reach. Do not use any sheets or blankets that are too big for your bed. They should not hang down onto the floor. Have a firm chair that has side arms. You can use this for support while you get dressed. Do not have throw rugs and other things on the floor that can make you trip. What can I do in the kitchen? Clean up any spills right away. Avoid walking on wet floors. Keep items that you use a lot in easy-to-reach places. If you need to reach something above you, use a strong step stool that has a grab bar. Keep electrical cords out of the way. Do not use floor polish or wax that makes floors slippery. If you must use wax, use non-skid floor wax. Do not have throw rugs and other things on the floor that can make you trip. What can I do with my stairs? Do not leave any items on the stairs. Make sure that there are handrails on both sides of the stairs and use them. Fix handrails that are broken or loose. Make sure that handrails are as long as the stairways. Check any carpeting to make sure that it is firmly attached to the stairs. Fix any carpet that is loose or  worn. Avoid having throw rugs at the top or bottom of the stairs. If you do have throw rugs, attach them to the floor with carpet tape. Make sure that you have a light switch at the top of the stairs and the bottom of the stairs. If you do not have them, ask someone to add them for you. What else can I do to help prevent falls? Wear shoes that: Do not have high heels. Have rubber bottoms. Are comfortable and fit you well. Are closed at the toe. Do not wear sandals. If you use a stepladder: Make sure that it is fully opened. Do not climb a closed stepladder. Make sure that both sides of the stepladder are locked into place. Ask  someone to hold it for you, if possible. Clearly mark and make sure that you can see: Any grab bars or handrails. First and last steps. Where the edge of each step is. Use tools that help you move around (mobility aids) if they are needed. These include: Canes. Walkers. Scooters. Crutches. Turn on the lights when you go into a dark area. Replace any light bulbs as soon as they burn out. Set up your furniture so you have a clear path. Avoid moving your furniture around. If any of your floors are uneven, fix them. If there are any pets around you, be aware of where they are. Review your medicines with your doctor. Some medicines can make you feel dizzy. This can increase your chance of falling. Ask your doctor what other things that you can do to help prevent falls. This information is not intended to replace advice given to you by your health care provider. Make sure you discuss any questions you have with your health care provider. Document Released: 04/19/2009 Document Revised: 11/29/2015 Document Reviewed: 07/28/2014 Elsevier Interactive Patient Education  2017 [employer].

## 2020-12-20 NOTE — Patient Instructions (Signed)

## 2020-12-21 ENCOUNTER — Ambulatory Visit: Payer: Medicare Other | Admitting: Rheumatology

## 2020-12-21 LAB — LIPID PANEL
Chol/HDL Ratio: 2.7 ratio (ref 0.0–4.4)
Cholesterol, Total: 162 mg/dL (ref 100–199)
HDL: 61 mg/dL (ref >39)
LDL Chol Calc (NIH): 84 mg/dL (ref 0–99)
Triglycerides: 90 mg/dL (ref 0–149)
VLDL Cholesterol Cal: 17 mg/dL (ref 5–40)

## 2020-12-21 LAB — CMP14+EGFR
ALT: 43 IU/L — ABNORMAL HIGH (ref 0–32)
AST: 25 IU/L (ref 0–40)
Albumin/Globulin Ratio: 1.8 (ref 1.2–2.2)
Albumin: 4.2 g/dL (ref 3.7–4.7)
Alkaline Phosphatase: 129 IU/L — ABNORMAL HIGH (ref 44–121)
BUN/Creatinine Ratio: 15 (ref 12–28)
BUN: 35 mg/dL — ABNORMAL HIGH (ref 8–27)
Bilirubin Total: 0.3 mg/dL (ref 0.0–1.2)
CO2: 18 mmol/L — ABNORMAL LOW (ref 20–29)
Calcium: 9 mg/dL (ref 8.7–10.3)
Chloride: 108 mmol/L — ABNORMAL HIGH (ref 96–106)
Creatinine, Ser: 2.31 mg/dL — ABNORMAL HIGH (ref 0.57–1.00)
Globulin, Total: 2.4 g/dL (ref 1.5–4.5)
Glucose: 99 mg/dL (ref 65–99)
Potassium: 4.8 mmol/L (ref 3.5–5.2)
Sodium: 140 mmol/L (ref 134–144)
Total Protein: 6.6 g/dL (ref 6.0–8.5)
eGFR: 22 mL/min/{1.73_m2} — ABNORMAL LOW (ref >59)

## 2020-12-21 LAB — CBC
Hematocrit: 37.5 % (ref 34.0–46.6)
Hemoglobin: 11.5 g/dL (ref 11.1–15.9)
MCH: 23.6 pg — ABNORMAL LOW (ref 26.6–33.0)
MCHC: 30.7 g/dL — ABNORMAL LOW (ref 31.5–35.7)
MCV: 77 fL — ABNORMAL LOW (ref 79–97)
Platelets: 197 10*3/uL (ref 150–450)
RBC: 4.87 x10E6/uL (ref 3.77–5.28)
RDW: 17 % — ABNORMAL HIGH (ref 11.7–15.4)
WBC: 7.7 10*3/uL (ref 3.4–10.8)

## 2020-12-21 LAB — HEMOGLOBIN A1C
Est. average glucose Bld gHb Est-mCnc: 103 mg/dL
Hgb A1c MFr Bld: 5.2 % (ref 4.8–5.6)

## 2020-12-21 NOTE — Chronic Care Management (AMB) (Signed)
  Chronic Care Management   Outreach Note  12/21/2020 Name: Miranda Roman MRN: [id number] DOB: June 18, 1946  Miranda Roman is a 75 y.o. year old female who is a primary care patient of Glendale Chard, MD. I reached out to Sandy Salaam by phone today in response to a referral sent by [name]'s PCP, Dr. Baird Cancer.       Third unsuccessful telephone outreach was attempted today. The patient was referred to the case management team for assistance with care management and care coordination. The patient's primary care provider has been notified of our unsuccessful attempts to make or maintain contact with the patient. The care management team is pleased to engage with this patient at any time in the future should he/she be interested in assistance from the care management team.   Follow Up Plan: If patient returns call to provider office, please advise to call [place] at [phone].  [place] [facility]

## 2020-12-25 LAB — URINE CULTURE

## 2021-01-02 DIAGNOSIS — I129 Hypertensive chronic kidney disease with stage 1 through stage 4 chronic kidney disease, or unspecified chronic kidney disease: Secondary | ICD-10-CM | POA: Diagnosis not present

## 2021-01-02 DIAGNOSIS — N184 Chronic kidney disease, stage 4 (severe): Secondary | ICD-10-CM | POA: Diagnosis not present

## 2021-01-02 DIAGNOSIS — N281 Cyst of kidney, acquired: Secondary | ICD-10-CM | POA: Diagnosis not present

## 2021-01-02 DIAGNOSIS — E1122 Type 2 diabetes mellitus with diabetic chronic kidney disease: Secondary | ICD-10-CM | POA: Diagnosis not present

## 2021-01-02 DIAGNOSIS — D631 Anemia in chronic kidney disease: Secondary | ICD-10-CM | POA: Diagnosis not present

## 2021-01-02 DIAGNOSIS — E872 Acidosis: Secondary | ICD-10-CM | POA: Diagnosis not present

## 2021-01-02 DIAGNOSIS — N2581 Secondary hyperparathyroidism of renal origin: Secondary | ICD-10-CM | POA: Diagnosis not present

## 2021-01-03 ENCOUNTER — Encounter (HOSPITAL_COMMUNITY)
Admission: RE | Admit: 2021-01-03 | Discharge: 2021-01-03 | Disposition: A | Discharge status: Home / Self Care | Payer: Medicare Other | Source: Ambulatory Visit | Attending: Nephrology | Admitting: Nephrology

## 2021-01-03 VITALS — BP 132/63 | HR 66 | Temp 97.1°F | Resp 20

## 2021-01-03 DIAGNOSIS — N184 Chronic kidney disease, stage 4 (severe): Secondary | ICD-10-CM | POA: Diagnosis not present

## 2021-01-03 DIAGNOSIS — E1122 Type 2 diabetes mellitus with diabetic chronic kidney disease: Secondary | ICD-10-CM

## 2021-01-03 MED ORDER — EPOETIN ALFA-EPBX 10000 UNIT/ML IJ SOLN: 10000.0000 [IU] | INTRAMUSCULAR | Status: DC

## 2021-01-03 MED ORDER — EPOETIN ALFA-EPBX 10000 UNIT/ML IJ SOLN
INTRAMUSCULAR | Status: AC
  Administered 2021-01-03: 10000 [IU] via SUBCUTANEOUS
  Filled 2021-01-03: qty 1

## 2021-01-04 LAB — POCT HEMOGLOBIN-HEMACUE: Hemoglobin: 10.4 g/dL — ABNORMAL LOW (ref 12.0–15.0)

## 2021-01-17 ENCOUNTER — Ambulatory Visit (HOSPITAL_COMMUNITY)
Admission: RE | Admit: 2021-01-17 | Discharge: 2021-01-17 | Disposition: A | Discharge status: Home / Self Care | Payer: Medicare Other | Source: Ambulatory Visit | Attending: Nephrology | Admitting: Nephrology

## 2021-01-17 ENCOUNTER — Other Ambulatory Visit: Payer: Self-pay

## 2021-01-17 VITALS — BP 124/59 | HR 78 | Temp 98.1°F | Resp 18

## 2021-01-17 DIAGNOSIS — E1122 Type 2 diabetes mellitus with diabetic chronic kidney disease: Secondary | ICD-10-CM | POA: Diagnosis not present

## 2021-01-17 DIAGNOSIS — N184 Chronic kidney disease, stage 4 (severe): Secondary | ICD-10-CM | POA: Insufficient documentation

## 2021-01-17 LAB — POCT HEMOGLOBIN-HEMACUE: Hemoglobin: 10.7 g/dL — ABNORMAL LOW (ref 12.0–15.0)

## 2021-01-17 LAB — IRON AND TIBC
Iron: 78 ug/dL (ref 28–170)
Saturation Ratios: 34 % — ABNORMAL HIGH (ref 10.4–31.8)
TIBC: 227 ug/dL — ABNORMAL LOW (ref 250–450)
UIBC: 149 ug/dL

## 2021-01-17 LAB — FERRITIN: Ferritin: 170 ng/mL (ref 11–307)

## 2021-01-17 MED ORDER — EPOETIN ALFA-EPBX 10000 UNIT/ML IJ SOLN: 10000.0000 [IU] | INTRAMUSCULAR | Status: DC

## 2021-01-17 MED ORDER — EPOETIN ALFA-EPBX 10000 UNIT/ML IJ SOLN
INTRAMUSCULAR | Status: AC
  Administered 2021-01-17: 10000 [IU] via SUBCUTANEOUS
  Filled 2021-01-17: qty 1

## 2021-01-19 ENCOUNTER — Other Ambulatory Visit: Payer: Self-pay | Admitting: Internal Medicine

## 2021-01-24 DIAGNOSIS — H538 Other visual disturbances: Secondary | ICD-10-CM | POA: Diagnosis not present

## 2021-01-24 DIAGNOSIS — H25013 Cortical age-related cataract, bilateral: Secondary | ICD-10-CM | POA: Diagnosis not present

## 2021-01-24 DIAGNOSIS — H2513 Age-related nuclear cataract, bilateral: Secondary | ICD-10-CM | POA: Diagnosis not present

## 2021-01-31 ENCOUNTER — Encounter (HOSPITAL_COMMUNITY)
Admission: RE | Admit: 2021-01-31 | Discharge: 2021-01-31 | Disposition: A | Discharge status: Home / Self Care | Payer: Medicare Other | Source: Ambulatory Visit | Attending: Nephrology | Admitting: Nephrology

## 2021-01-31 ENCOUNTER — Other Ambulatory Visit: Payer: Self-pay

## 2021-01-31 VITALS — BP 125/62 | HR 63 | Temp 98.1°F

## 2021-01-31 DIAGNOSIS — N184 Chronic kidney disease, stage 4 (severe): Secondary | ICD-10-CM | POA: Insufficient documentation

## 2021-01-31 DIAGNOSIS — E1122 Type 2 diabetes mellitus with diabetic chronic kidney disease: Secondary | ICD-10-CM | POA: Diagnosis not present

## 2021-01-31 MED ORDER — EPOETIN ALFA-EPBX 10000 UNIT/ML IJ SOLN
10000.0000 [IU] | INTRAMUSCULAR | Status: DC
Start: 2021-01-31 — End: 2021-02-01
  Administered 2021-01-31: 10000 [IU] via SUBCUTANEOUS

## 2021-01-31 MED ORDER — EPOETIN ALFA-EPBX 10000 UNIT/ML IJ SOLN
INTRAMUSCULAR | Status: AC
  Filled 2021-01-31: qty 1

## 2021-02-01 LAB — POCT HEMOGLOBIN-HEMACUE: Hemoglobin: 11.2 g/dL — ABNORMAL LOW (ref 12.0–15.0)

## 2021-02-04 ENCOUNTER — Ambulatory Visit (INDEPENDENT_AMBULATORY_CARE_PROVIDER_SITE_OTHER): Payer: Medicare Other | Admitting: Internal Medicine

## 2021-02-04 ENCOUNTER — Other Ambulatory Visit: Payer: Self-pay

## 2021-02-04 ENCOUNTER — Encounter: Payer: Self-pay | Admitting: Internal Medicine

## 2021-02-04 VITALS — BP 122/68 | HR 69 | Temp 98.4°F | Ht 63.6 in | Wt 162.4 lb

## 2021-02-04 DIAGNOSIS — I272 Pulmonary hypertension, unspecified: Secondary | ICD-10-CM

## 2021-02-04 DIAGNOSIS — L03011 Cellulitis of right finger: Secondary | ICD-10-CM

## 2021-02-04 DIAGNOSIS — N184 Chronic kidney disease, stage 4 (severe): Secondary | ICD-10-CM

## 2021-02-04 DIAGNOSIS — E1122 Type 2 diabetes mellitus with diabetic chronic kidney disease: Secondary | ICD-10-CM | POA: Diagnosis not present

## 2021-02-04 DIAGNOSIS — I129 Hypertensive chronic kidney disease with stage 1 through stage 4 chronic kidney disease, or unspecified chronic kidney disease: Secondary | ICD-10-CM

## 2021-02-04 DIAGNOSIS — J479 Bronchiectasis, uncomplicated: Secondary | ICD-10-CM | POA: Diagnosis not present

## 2021-02-04 DIAGNOSIS — M7501 Adhesive capsulitis of right shoulder: Secondary | ICD-10-CM | POA: Diagnosis not present

## 2021-02-04 HISTORY — DX: Pulmonary hypertension, unspecified: I27.20

## 2021-02-04 HISTORY — DX: Bronchiectasis, uncomplicated: J47.9

## 2021-02-04 MED ORDER — DOXYCYCLINE HYCLATE 100 MG PO TABS: 100.0000 mg | ORAL_TABLET | Freq: Two times a day (BID) | ORAL | 0 refills | Status: DC

## 2021-02-04 NOTE — Progress Notes (Signed)
I,[name],acting as a Education administrator for [name], MD.,have documented all relevant documentation on the behalf of [name], MD,as directed by  [name], MD while in the presence of [name], MD.  This visit occurred during the SARS-CoV-2 public health emergency.  Safety protocols were in place, including screening questions prior to the visit, additional usage of staff PPE, and extensive cleaning of exam room while observing appropriate contact time as indicated for disinfecting solutions.  Subjective:     Patient ID: Miranda Roman , female    DOB: 27-Mar-1946 , 75 y.o.   MRN: [id number]   Chief Complaint  Patient presents with  . Diabetes  . Hypertension    HPI  Patient is here today for diabetes check. She states that she is compliant with medications. Her grand-daughter is here with her today.   She reports she is up to date with her specialists. She states she checks her BS every morning and BS are usually between 90-110.   Diabetes She presents for her follow-up diabetic visit. She has type 2 diabetes mellitus. Her disease course has been stable. There are no hypoglycemic associated symptoms. Pertinent negatives for hypoglycemia include no dizziness. There are no diabetic associated symptoms. Pertinent negatives for diabetes include no blurred vision, no chest pain, no polydipsia, no polyphagia, no polyuria and no weakness. There are no hypoglycemic complications. Diabetic complications include nephropathy. Risk factors for coronary artery disease include diabetes mellitus, dyslipidemia, hypertension, sedentary lifestyle and post-menopausal. Current diabetic treatment includes insulin injections. She is compliant with treatment most of the time. She is following a diabetic diet. Meal planning includes avoidance of concentrated sweets.  Hypertension This is a chronic problem. The current episode started more than 1 year ago. The problem has been gradually  improving since onset. Pertinent negatives include no blurred vision, chest pain, palpitations or shortness of breath. The current treatment provides moderate improvement.    Past Medical History:  Diagnosis Date  . Anemia   . Diabetes mellitus   . Hyperlipidemia   . Hypertension   . Osteoporosis   . Sickle cell trait ([place])   . 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 Diabetes Mother   . Heart disease Mother   . Hypertension Mother   . Stroke Mother   . Diabetes Father   . Heart disease Father   . Diabetes Sister   . Diabetes Brother   . Hypertension Brother   . Sickle cell anemia Brother   . Diabetes Maternal Aunt   . Diabetes Maternal Uncle   . Diabetes Maternal Grandmother   . Diabetes Maternal Grandfather   . Sickle cell anemia Sister   . Sickle cell anemia Sister   . Healthy Son   . Healthy Son   . Healthy Daughter      Current Outpatient Medications:  .  doxycycline (VIBRA-TABS) 100 MG tablet, Take 1 tablet (100 mg total) by mouth 2 (two) times daily., Disp: 14 tablet, Rfl: 0 .  acetaminophen (TYLENOL) 500 MG tablet, Take 1 tablet (500 mg total) by mouth every 6 (six) hours as needed for moderate pain or fever., Disp: 30 tablet, Rfl: 0 .  amLODipine (NORVASC) 10 MG tablet, 1 tablet, Disp: , Rfl:  .  amLODipine (NORVASC) 2.5 MG tablet, Take 2.5 mg by mouth daily., Disp: , Rfl:  .  atorvastatin (LIPITOR) 20 MG tablet, Take one tablet by mouth M-F and  skip weekends, Disp: 75 tablet, Rfl: 1 .  BD INSULIN SYRINGE U/F 31G X 5/16" 0.3 ML MISC, See admin instructions. use with insulin, Disp: , Rfl:  .  calcitRIOL (ROCALTROL) 0.25 MCG capsule, Take 0.25 mcg by mouth 3 (three) times a week., Disp: , Rfl:  .  cetirizine (ZYRTEC) 10 MG tablet, Take 10 mg by mouth daily., Disp: , Rfl:  .  Cyanocobalamin (VITAMIN B12) 1000 MCG TBCR, 1 tablet, Disp: , Rfl:  .  diclofenac Sodium (VOLTAREN) 1 % GEL, Apply 2 g topically 4 (four) times daily.  Apply to left knee., Disp: 50 g, Rfl: 0 .  epoetin alfa (PROCRIT) 42595 UNIT/ML injection, , Disp: , Rfl:  .  estradiol (ESTRACE) 0.1 MG/GM vaginal cream, PLEASE SEE ATTACHED FOR DETAILED DIRECTIONS, Disp: , Rfl:  .  gabapentin (NEURONTIN) 100 MG capsule, One capsule po qhs, Disp: 30 capsule, Rfl: 2 .  HUMALOG 100 UNIT/ML injection, Inject 4 Unit(s) SUB-Q 3 Times Daily, Disp: 10 mL, Rfl: 1 .  insulin detemir (LEVEMIR FLEXTOUCH) 100 UNIT/ML FlexPen, 6 Units., Disp: , Rfl:  .  Insulin Pen Needle (BD PEN NEEDLE NANO U/F) 32G X 4 MM MISC, Use as directed with insulin pen, Disp: 150 each, Rfl: 2 .  metoprolol tartrate (LOPRESSOR) 50 MG tablet, TAKE 1 TABLET BY MOUTH TWICE A DAY, Disp: 180 tablet, Rfl: 1 .  Multiple Vitamins-Minerals (CENTRUM SILVER PO), Take by mouth daily., Disp: , Rfl:  .  sodium bicarbonate 650 MG tablet, Take 650 mg by mouth daily., Disp: , Rfl:  .  thiamine 100 MG tablet, Take 1 tablet (100 mg total) by mouth daily., Disp: , Rfl:    Allergies  Allergen Reactions  . 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 Lactose Intolerance (Gi)     Per pt's daughter causes gas     Review of Systems  Constitutional: Negative.   Eyes:  Negative for blurred vision.  Respiratory: Negative.  Negative for shortness of breath.   Cardiovascular: Negative.  Negative for chest pain and palpitations.  Gastrointestinal: Negative.   Endocrine: Negative for polydipsia, polyphagia and polyuria.  Skin:        She c/o right pointer finger pain. States her sx started back in Jan. She is unable to state what has triggered her sx. It is painful and warm to touch. She denies having any drainage.   Neurological: Negative.  Negative for dizziness and weakness.    Today's Vitals   02/04/21 1038  BP: 122/68  Pulse: 69  Temp: 98.4 F (36.9 C)  TempSrc: Oral  Weight: 162 lb 6.4 oz (73.7 kg)  Height: 5' 3.6" (1.615 m)   Body mass index is 28.23  kg/m.   Objective:  Physical Exam Vitals and nursing note reviewed.  Constitutional:      Appearance: Normal appearance.  HENT:     Head: Normocephalic and atraumatic.     Nose:     Comments: Masked     Mouth/Throat:     Comments: Masked  Cardiovascular:     Rate and Rhythm: Normal rate and regular rhythm.     Heart sounds: Normal heart sounds.  Pulmonary:     Effort: Pulmonary effort is normal.     Breath sounds: Normal breath sounds.  Musculoskeletal:        General: Tenderness present. No swelling.     Cervical back: Normal range of motion.  Skin:    General: Skin  is warm.     Comments: Erythema around cuticle of right 2nd finger. No tenderness w/ palpation of nail.   Neurological:     General: No focal deficit present.     Mental Status: She is alert.  Psychiatric:        Mood and Affect: Mood normal.        Behavior: Behavior normal.        Assessment And Plan:     1. 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 Renal input appreciated.  - Fructosamine  2. Hypertensive nephropathy Comments: Chronic, well controlled. She is encouraged to follow low sodium diet.   3. Paronychia of finger, right Comments: I will send rx doxy to her local pharmacy. She is encouraged to take full abx course.   4. Pulmonary hypertension, unspecified ([place]) Comments: Most recent echo reviewed in full detail.  5. Bronchiectasis without complication ([place]) Comments: Chronic, seen on CT scan. She has been eval by Pulmonary, no need for meds at this time.   6. Adhesive capsulitis of right shoulder Comments: She was given some exercises to perform. I will refer her to home PT as requested.  - Ambulatory referral to [place]   Patient was given opportunity to ask questions. Patient verbalized understanding of the plan and was able to repeat key elements of the plan. All questions were answered to their satisfaction.    I, [name], MD, have reviewed all documentation for this visit. The documentation on 02/04/21 for the exam, diagnosis, procedures, and orders are all accurate and complete.   IF YOU HAVE BEEN REFERRED TO A SPECIALIST, IT MAY TAKE 1-2 WEEKS TO SCHEDULE/PROCESS THE REFERRAL. IF YOU HAVE NOT HEARD FROM US/SPECIALIST IN TWO WEEKS, PLEASE GIVE [place] A CALL AT [phone].   THE PATIENT IS ENCOURAGED TO PRACTICE SOCIAL DISTANCING DUE TO THE COVID-19 PANDEMIC.

## 2021-02-04 NOTE — Patient Instructions (Addendum)
Paronychia Paronychia is an infection of the skin. It happens near a fingernail or toenail. It may cause pain and swelling around the nail. In some cases, a fluid-filled bump (abscess) can form near or under the nail. Usually, this condition is not serious, and it clears up with treatment. Follow these instructions at home: Wound care Keep the affected area clean. Soak the fingers or toes in warm water as told by your doctor. You may be told to do this for 20 minutes, 2-3 times a day. Keep the area dry when you are not soaking it. Do not try to drain a fluid-filled bump on your own. Follow instructions from your doctor about how to take care of the affected area. Make sure you: Wash your hands with soap and water before you change your bandage (dressing). If you cannot use soap and water, use hand sanitizer. Change your bandage as told by your doctor. If you had a fluid-filled bump and your doctor drained it, check the area every day for signs of infection. Check for: Redness, swelling, or pain. Fluid or blood. Warmth. Pus or a bad smell. Medicines  Take over-the-counter and prescription medicines only as told by your doctor. If you were prescribed an antibiotic medicine, take it as told by your doctor. Do not stop taking it even if you start to feel better.  General instructions Avoid touching any chemicals. Do not pick at the affected area. Prevention To prevent this condition from happening again: Wear rubber gloves when putting your hands in water for washing dishes or other tasks. Wear gloves if your hands might touch cleaners or chemicals. Avoid injuring your nails or fingertips. Do not bite your nails or tear hangnails. Do not cut your nails very short. Do not cut the skin at the base and sides of the nail (cuticles). Use clean nail clippers or scissors when trimming nails. Contact a doctor if: You feel worse. You do not get better. You have more fluid, blood, or pus  coming from the affected area. Your finger or knuckle is swollen or is hard to move. Get help right away if you have: A fever or chills. Redness spreading from the affected area. Pain in a joint or muscle. Summary Paronychia is an infection of the skin. It happens near a fingernail or toenail. This condition may cause pain and swelling around the nail. Soak the fingers or toes in warm water as told by your doctor. Usually, this condition is not serious, and it clears up with treatment. This information is not intended to replace advice given to you by your health care provider. Make sure you discuss any questions you have with your healthcare provider. Document Revised: 04/17/2020 Document Reviewed: 04/18/2020 Elsevier Patient Education  2022 [place].   Diabetes Mellitus and Nutrition, Adult When you have diabetes, or diabetes mellitus, it is very important to have healthy eating habits because your blood sugar (glucose) levels are greatly affected by what you eat and drink. Eating healthy foods in the right amounts, at about the same times every day, can help you: Control your blood glucose. Lower your risk of heart disease. Improve your blood pressure. Reach or maintain a healthy weight. What can affect my meal plan? Every person with diabetes is different, and each person has different needs for a meal plan. Your health care provider may recommend that you work with a dietitian to make a meal plan that is best for you. Your meal plan may vary depending on factors  such as: The calories you need. The medicines you take. Your weight. Your blood glucose, blood pressure, and cholesterol levels. Your activity level. Other health conditions you have, such as heart or kidney disease. How do carbohydrates affect me? Carbohydrates, also called carbs, affect your blood glucose level more than any other type of food. Eating carbs naturally raises the amount of glucose in your blood. Carb  counting is a method for keeping track of how many carbs you eat. Counting carbs is important to keep your blood glucose at a healthy level,especially if you use insulin or take certain oral diabetes medicines. It is important to know how many carbs you can safely have in each meal. This is different for every person. Your dietitian can help you calculate how manycarbs you should have at each meal and for each snack. How does alcohol affect me? Alcohol can cause a sudden decrease in blood glucose (hypoglycemia), especially if you use insulin or take certain oral diabetes medicines. Hypoglycemia can be a life-threatening condition. Symptoms of hypoglycemia, such as sleepiness, dizziness, and confusion, are similar to symptoms of having too much alcohol. Do not drink alcohol if: Your health care provider tells you not to drink. You are pregnant, may be pregnant, or are planning to become pregnant. If you drink alcohol: Do not drink on an empty stomach. Limit how much you use to: 0-1 drink a day for women. 0-2 drinks a day for men. Be aware of how much alcohol is in your drink. In the [place] one drink equals one 12 oz bottle of beer (355 mL), one 5 oz glass of wine (148 mL), or one 1 oz glass of hard liquor (44 mL). Keep yourself hydrated with water, diet soda, or unsweetened iced tea. Keep in mind that regular soda, juice, and other mixers may contain a lot of sugar and must be counted as carbs. What are tips for following this plan?  Reading food labels Start by checking the serving size on the "Nutrition Facts" label of packaged foods and drinks. The amount of calories, carbs, fats, and other nutrients listed on the label is based on one serving of the item. Many items contain more than one serving per package. Check the total grams (g) of carbs in one serving. You can calculate the number of servings of carbs in one serving by dividing the total carbs by 15. For example, if a food has 30 g of  total carbs per serving, it would be equal to 2 servings of carbs. Check the number of grams (g) of saturated fats and trans fats in one serving. Choose foods that have a low amount or none of these fats. Check the number of milligrams (mg) of salt (sodium) in one serving. Most people should limit total sodium intake to less than 2,300 mg per day. Always check the nutrition information of foods labeled as "low-fat" or "nonfat." These foods may be higher in added sugar or refined carbs and should be avoided. Talk to your dietitian to identify your daily goals for nutrients listed on the label. Shopping Avoid buying canned, pre-made, or processed foods. These foods tend to be high in fat, sodium, and added sugar. Shop around the outside edge of the grocery store. This is where you will most often find fresh fruits and vegetables, bulk grains, fresh meats, and fresh dairy. Cooking Use low-heat cooking methods, such as baking, instead of high-heat cooking methods like deep frying. Cook using healthy oils, such as olive, canola, or  sunflower oil. Avoid cooking with butter, cream, or high-fat meats. Meal planning Eat meals and snacks regularly, preferably at the same times every day. Avoid going long periods of time without eating. Eat foods that are high in fiber, such as fresh fruits, vegetables, beans, and whole grains. Talk with your dietitian about how many servings of carbs you can eat at each meal. Eat 4-6 oz (112-168 g) of lean protein each day, such as lean meat, chicken, fish, eggs, or tofu. One ounce (oz) of lean protein is equal to: 1 oz (28 g) of meat, chicken, or fish. 1 egg.  cup (62 g) of tofu. Eat some foods each day that contain healthy fats, such as avocado, nuts, seeds, and fish. What foods should I eat? Fruits Berries. Apples. Oranges. Peaches. Apricots. Plums. Grapes. Mango. Papaya.Pomegranate. Kiwi. Cherries. Vegetables Lettuce. Spinach. Leafy greens, including kale, chard,  collard greens, and mustard greens. Beets. Cauliflower. Cabbage. Broccoli. Carrots. Green beans.Tomatoes. Peppers. Onions. Cucumbers. Brussels sprouts. Grains Whole grains, such as whole-wheat or whole-grain bread, crackers, tortillas,cereal, and pasta. Unsweetened oatmeal. Quinoa. Brown or wild rice. Meats and other proteins Seafood. Poultry without skin. Lean cuts of poultry and beef. Tofu. Nuts. Seeds. Dairy Low-fat or fat-free dairy products such as milk, yogurt, and cheese. The items listed above may not be a complete list of foods and beverages you can eat. Contact a dietitian for more information. What foods should I avoid? Fruits Fruits canned with syrup. Vegetables Canned vegetables. Frozen vegetables with butter or cream sauce. Grains Refined white flour and flour products such as bread, pasta, snack foods, andcereals. Avoid all processed foods. Meats and other proteins Fatty cuts of meat. Poultry with skin. Breaded or fried meats. Processed meat.Avoid saturated fats. Dairy Full-fat yogurt, cheese, or milk. Beverages Sweetened drinks, such as soda or iced tea. The items listed above may not be a complete list of foods and beverages you should avoid. Contact a dietitian for more information. Questions to ask a health care provider Do I need to meet with a diabetes educator? Do I need to meet with a dietitian? What number can I call if I have questions? When are the best times to check my blood glucose? Where to find more information: American Diabetes Association: diabetes.org Academy of Nutrition and Dietetics: www.eatright.[employer] of Diabetes and Digestive and Kidney Diseases: [place] Association of Diabetes Care and Education Specialists: www.diabeteseducator.org Summary It is important to have healthy eating habits because your blood sugar (glucose) levels are greatly affected by what you eat and drink. A healthy meal plan will help you control  your blood glucose and maintain a healthy lifestyle. Your health care provider may recommend that you work with a dietitian to make a meal plan that is best for you. Keep in mind that carbohydrates (carbs) and alcohol have immediate effects on your blood glucose levels. It is important to count carbs and to use alcohol carefully. This information is not intended to replace advice given to you by your health care provider. Make sure you discuss any questions you have with your healthcare provider. Document Revised: 05/31/2019 Document Reviewed: 05/31/2019 Elsevier Patient Education  2021 [employer].

## 2021-02-05 LAB — FRUCTOSAMINE: Fructosamine: 315 umol/L — ABNORMAL HIGH (ref 0–285)

## 2021-02-06 ENCOUNTER — Telehealth: Payer: Self-pay

## 2021-02-06 NOTE — Telephone Encounter (Signed)
Dr. Rhetta Mura office called, provider there recommends pt to have cataracts surgery.   Provider wanted to know if pt needed to come in and see Dr.Jaffe before the surgery can be scheduled or if Dr.Jaffe would send a letter over stating the pt is cleared to go ahead with surgery.    Please advise.

## 2021-02-06 NOTE — Telephone Encounter (Signed)
  I am not sure why a neurology clearance is needed for episode of encephalopathy back in December.  But I haven't seen her since then and she would need to be re-evaluated.  However, this is not considered urgent work-in   Tried calling pt no answer. Unable to lvm. Vm full.   [place] desk, If a patient call please schedule.

## 2021-02-14 ENCOUNTER — Encounter (HOSPITAL_COMMUNITY): Payer: Medicare Other

## 2021-02-28 ENCOUNTER — Other Ambulatory Visit: Payer: Self-pay

## 2021-02-28 ENCOUNTER — Ambulatory Visit (HOSPITAL_COMMUNITY)
Admission: RE | Admit: 2021-02-28 | Discharge: 2021-02-28 | Disposition: A | Discharge status: Home / Self Care | Payer: Medicare Other | Source: Ambulatory Visit | Attending: Nephrology | Admitting: Nephrology

## 2021-02-28 VITALS — BP 102/53 | HR 73 | Temp 98.1°F | Resp 20

## 2021-02-28 DIAGNOSIS — E1122 Type 2 diabetes mellitus with diabetic chronic kidney disease: Secondary | ICD-10-CM | POA: Insufficient documentation

## 2021-02-28 DIAGNOSIS — N184 Chronic kidney disease, stage 4 (severe): Secondary | ICD-10-CM | POA: Diagnosis not present

## 2021-02-28 LAB — IRON AND TIBC
Iron: 85 ug/dL (ref 28–170)
Saturation Ratios: 39 % — ABNORMAL HIGH (ref 10.4–31.8)
TIBC: 217 ug/dL — ABNORMAL LOW (ref 250–450)
UIBC: 132 ug/dL

## 2021-02-28 LAB — FERRITIN: Ferritin: 333 ng/mL — ABNORMAL HIGH (ref 11–307)

## 2021-02-28 LAB — POCT HEMOGLOBIN-HEMACUE: Hemoglobin: 10 g/dL — ABNORMAL LOW (ref 12.0–15.0)

## 2021-02-28 MED ORDER — EPOETIN ALFA-EPBX 10000 UNIT/ML IJ SOLN
10000.0000 [IU] | INTRAMUSCULAR | Status: DC
  Administered 2021-02-28: 10000 [IU] via SUBCUTANEOUS

## 2021-02-28 MED ORDER — EPOETIN ALFA-EPBX 10000 UNIT/ML IJ SOLN
INTRAMUSCULAR | Status: AC
  Filled 2021-02-28: qty 1

## 2021-03-01 DIAGNOSIS — M7501 Adhesive capsulitis of right shoulder: Secondary | ICD-10-CM | POA: Diagnosis not present

## 2021-03-01 DIAGNOSIS — I351 Nonrheumatic aortic (valve) insufficiency: Secondary | ICD-10-CM | POA: Diagnosis not present

## 2021-03-01 DIAGNOSIS — E1122 Type 2 diabetes mellitus with diabetic chronic kidney disease: Secondary | ICD-10-CM | POA: Diagnosis not present

## 2021-03-01 DIAGNOSIS — Z794 Long term (current) use of insulin: Secondary | ICD-10-CM | POA: Diagnosis not present

## 2021-03-01 DIAGNOSIS — E1151 Type 2 diabetes mellitus with diabetic peripheral angiopathy without gangrene: Secondary | ICD-10-CM | POA: Diagnosis not present

## 2021-03-01 DIAGNOSIS — J479 Bronchiectasis, uncomplicated: Secondary | ICD-10-CM | POA: Diagnosis not present

## 2021-03-01 DIAGNOSIS — I2510 Atherosclerotic heart disease of native coronary artery without angina pectoris: Secondary | ICD-10-CM | POA: Diagnosis not present

## 2021-03-01 DIAGNOSIS — E785 Hyperlipidemia, unspecified: Secondary | ICD-10-CM | POA: Diagnosis not present

## 2021-03-01 DIAGNOSIS — D649 Anemia, unspecified: Secondary | ICD-10-CM | POA: Diagnosis not present

## 2021-03-01 DIAGNOSIS — K219 Gastro-esophageal reflux disease without esophagitis: Secondary | ICD-10-CM | POA: Diagnosis not present

## 2021-03-01 DIAGNOSIS — Z9884 Bariatric surgery status: Secondary | ICD-10-CM | POA: Diagnosis not present

## 2021-03-01 DIAGNOSIS — M810 Age-related osteoporosis without current pathological fracture: Secondary | ICD-10-CM | POA: Diagnosis not present

## 2021-03-01 DIAGNOSIS — L03011 Cellulitis of right finger: Secondary | ICD-10-CM | POA: Diagnosis not present

## 2021-03-01 DIAGNOSIS — N184 Chronic kidney disease, stage 4 (severe): Secondary | ICD-10-CM | POA: Diagnosis not present

## 2021-03-01 DIAGNOSIS — I129 Hypertensive chronic kidney disease with stage 1 through stage 4 chronic kidney disease, or unspecified chronic kidney disease: Secondary | ICD-10-CM | POA: Diagnosis not present

## 2021-03-01 DIAGNOSIS — I272 Pulmonary hypertension, unspecified: Secondary | ICD-10-CM | POA: Diagnosis not present

## 2021-03-05 DIAGNOSIS — K219 Gastro-esophageal reflux disease without esophagitis: Secondary | ICD-10-CM | POA: Diagnosis not present

## 2021-03-05 DIAGNOSIS — E1151 Type 2 diabetes mellitus with diabetic peripheral angiopathy without gangrene: Secondary | ICD-10-CM | POA: Diagnosis not present

## 2021-03-05 DIAGNOSIS — E119 Type 2 diabetes mellitus without complications: Secondary | ICD-10-CM | POA: Diagnosis not present

## 2021-03-05 DIAGNOSIS — Z794 Long term (current) use of insulin: Secondary | ICD-10-CM | POA: Diagnosis not present

## 2021-03-05 DIAGNOSIS — L03011 Cellulitis of right finger: Secondary | ICD-10-CM | POA: Diagnosis not present

## 2021-03-05 DIAGNOSIS — I272 Pulmonary hypertension, unspecified: Secondary | ICD-10-CM | POA: Diagnosis not present

## 2021-03-05 DIAGNOSIS — I351 Nonrheumatic aortic (valve) insufficiency: Secondary | ICD-10-CM | POA: Diagnosis not present

## 2021-03-05 DIAGNOSIS — M7501 Adhesive capsulitis of right shoulder: Secondary | ICD-10-CM | POA: Diagnosis not present

## 2021-03-05 DIAGNOSIS — M810 Age-related osteoporosis without current pathological fracture: Secondary | ICD-10-CM | POA: Diagnosis not present

## 2021-03-05 DIAGNOSIS — Z9884 Bariatric surgery status: Secondary | ICD-10-CM | POA: Diagnosis not present

## 2021-03-05 DIAGNOSIS — D649 Anemia, unspecified: Secondary | ICD-10-CM | POA: Diagnosis not present

## 2021-03-05 DIAGNOSIS — I10 Essential (primary) hypertension: Secondary | ICD-10-CM | POA: Diagnosis not present

## 2021-03-05 DIAGNOSIS — E785 Hyperlipidemia, unspecified: Secondary | ICD-10-CM | POA: Diagnosis not present

## 2021-03-05 DIAGNOSIS — J479 Bronchiectasis, uncomplicated: Secondary | ICD-10-CM | POA: Diagnosis not present

## 2021-03-05 DIAGNOSIS — I2510 Atherosclerotic heart disease of native coronary artery without angina pectoris: Secondary | ICD-10-CM | POA: Diagnosis not present

## 2021-03-05 DIAGNOSIS — I129 Hypertensive chronic kidney disease with stage 1 through stage 4 chronic kidney disease, or unspecified chronic kidney disease: Secondary | ICD-10-CM | POA: Diagnosis not present

## 2021-03-05 DIAGNOSIS — E1122 Type 2 diabetes mellitus with diabetic chronic kidney disease: Secondary | ICD-10-CM | POA: Diagnosis not present

## 2021-03-05 DIAGNOSIS — N184 Chronic kidney disease, stage 4 (severe): Secondary | ICD-10-CM | POA: Diagnosis not present

## 2021-03-07 ENCOUNTER — Other Ambulatory Visit: Payer: Self-pay

## 2021-03-07 DIAGNOSIS — I272 Pulmonary hypertension, unspecified: Secondary | ICD-10-CM | POA: Diagnosis not present

## 2021-03-07 DIAGNOSIS — M7501 Adhesive capsulitis of right shoulder: Secondary | ICD-10-CM | POA: Diagnosis not present

## 2021-03-07 DIAGNOSIS — Z794 Long term (current) use of insulin: Secondary | ICD-10-CM | POA: Diagnosis not present

## 2021-03-07 DIAGNOSIS — I2510 Atherosclerotic heart disease of native coronary artery without angina pectoris: Secondary | ICD-10-CM | POA: Diagnosis not present

## 2021-03-07 DIAGNOSIS — D649 Anemia, unspecified: Secondary | ICD-10-CM | POA: Diagnosis not present

## 2021-03-07 DIAGNOSIS — E1122 Type 2 diabetes mellitus with diabetic chronic kidney disease: Secondary | ICD-10-CM | POA: Diagnosis not present

## 2021-03-07 DIAGNOSIS — I129 Hypertensive chronic kidney disease with stage 1 through stage 4 chronic kidney disease, or unspecified chronic kidney disease: Secondary | ICD-10-CM | POA: Diagnosis not present

## 2021-03-07 DIAGNOSIS — J479 Bronchiectasis, uncomplicated: Secondary | ICD-10-CM | POA: Diagnosis not present

## 2021-03-07 DIAGNOSIS — N184 Chronic kidney disease, stage 4 (severe): Secondary | ICD-10-CM | POA: Diagnosis not present

## 2021-03-07 DIAGNOSIS — E1151 Type 2 diabetes mellitus with diabetic peripheral angiopathy without gangrene: Secondary | ICD-10-CM | POA: Diagnosis not present

## 2021-03-07 DIAGNOSIS — Z9884 Bariatric surgery status: Secondary | ICD-10-CM | POA: Diagnosis not present

## 2021-03-07 DIAGNOSIS — E785 Hyperlipidemia, unspecified: Secondary | ICD-10-CM | POA: Diagnosis not present

## 2021-03-07 DIAGNOSIS — L03011 Cellulitis of right finger: Secondary | ICD-10-CM | POA: Diagnosis not present

## 2021-03-07 DIAGNOSIS — I351 Nonrheumatic aortic (valve) insufficiency: Secondary | ICD-10-CM | POA: Diagnosis not present

## 2021-03-07 DIAGNOSIS — K219 Gastro-esophageal reflux disease without esophagitis: Secondary | ICD-10-CM | POA: Diagnosis not present

## 2021-03-07 DIAGNOSIS — M810 Age-related osteoporosis without current pathological fracture: Secondary | ICD-10-CM | POA: Diagnosis not present

## 2021-03-07 MED ORDER — GABAPENTIN 100 MG PO CAPS: ORAL_CAPSULE | ORAL | 2 refills | Status: DC

## 2021-03-08 DIAGNOSIS — N184 Chronic kidney disease, stage 4 (severe): Secondary | ICD-10-CM | POA: Diagnosis not present

## 2021-03-08 DIAGNOSIS — I272 Pulmonary hypertension, unspecified: Secondary | ICD-10-CM | POA: Diagnosis not present

## 2021-03-08 DIAGNOSIS — E1122 Type 2 diabetes mellitus with diabetic chronic kidney disease: Secondary | ICD-10-CM | POA: Diagnosis not present

## 2021-03-08 DIAGNOSIS — M810 Age-related osteoporosis without current pathological fracture: Secondary | ICD-10-CM | POA: Diagnosis not present

## 2021-03-08 DIAGNOSIS — E1151 Type 2 diabetes mellitus with diabetic peripheral angiopathy without gangrene: Secondary | ICD-10-CM | POA: Diagnosis not present

## 2021-03-08 DIAGNOSIS — Z9884 Bariatric surgery status: Secondary | ICD-10-CM | POA: Diagnosis not present

## 2021-03-08 DIAGNOSIS — I2510 Atherosclerotic heart disease of native coronary artery without angina pectoris: Secondary | ICD-10-CM | POA: Diagnosis not present

## 2021-03-08 DIAGNOSIS — M7501 Adhesive capsulitis of right shoulder: Secondary | ICD-10-CM | POA: Diagnosis not present

## 2021-03-08 DIAGNOSIS — K219 Gastro-esophageal reflux disease without esophagitis: Secondary | ICD-10-CM | POA: Diagnosis not present

## 2021-03-08 DIAGNOSIS — I129 Hypertensive chronic kidney disease with stage 1 through stage 4 chronic kidney disease, or unspecified chronic kidney disease: Secondary | ICD-10-CM | POA: Diagnosis not present

## 2021-03-08 DIAGNOSIS — L03011 Cellulitis of right finger: Secondary | ICD-10-CM | POA: Diagnosis not present

## 2021-03-08 DIAGNOSIS — E785 Hyperlipidemia, unspecified: Secondary | ICD-10-CM | POA: Diagnosis not present

## 2021-03-08 DIAGNOSIS — J479 Bronchiectasis, uncomplicated: Secondary | ICD-10-CM | POA: Diagnosis not present

## 2021-03-08 DIAGNOSIS — Z794 Long term (current) use of insulin: Secondary | ICD-10-CM | POA: Diagnosis not present

## 2021-03-08 DIAGNOSIS — D649 Anemia, unspecified: Secondary | ICD-10-CM | POA: Diagnosis not present

## 2021-03-08 DIAGNOSIS — I351 Nonrheumatic aortic (valve) insufficiency: Secondary | ICD-10-CM | POA: Diagnosis not present

## 2021-03-11 DIAGNOSIS — I2510 Atherosclerotic heart disease of native coronary artery without angina pectoris: Secondary | ICD-10-CM | POA: Diagnosis not present

## 2021-03-11 DIAGNOSIS — E1151 Type 2 diabetes mellitus with diabetic peripheral angiopathy without gangrene: Secondary | ICD-10-CM | POA: Diagnosis not present

## 2021-03-11 DIAGNOSIS — M810 Age-related osteoporosis without current pathological fracture: Secondary | ICD-10-CM | POA: Diagnosis not present

## 2021-03-11 DIAGNOSIS — E785 Hyperlipidemia, unspecified: Secondary | ICD-10-CM | POA: Diagnosis not present

## 2021-03-11 DIAGNOSIS — I351 Nonrheumatic aortic (valve) insufficiency: Secondary | ICD-10-CM | POA: Diagnosis not present

## 2021-03-11 DIAGNOSIS — M7501 Adhesive capsulitis of right shoulder: Secondary | ICD-10-CM | POA: Diagnosis not present

## 2021-03-11 DIAGNOSIS — N184 Chronic kidney disease, stage 4 (severe): Secondary | ICD-10-CM | POA: Diagnosis not present

## 2021-03-11 DIAGNOSIS — Z794 Long term (current) use of insulin: Secondary | ICD-10-CM | POA: Diagnosis not present

## 2021-03-11 DIAGNOSIS — J479 Bronchiectasis, uncomplicated: Secondary | ICD-10-CM | POA: Diagnosis not present

## 2021-03-11 DIAGNOSIS — I129 Hypertensive chronic kidney disease with stage 1 through stage 4 chronic kidney disease, or unspecified chronic kidney disease: Secondary | ICD-10-CM | POA: Diagnosis not present

## 2021-03-11 DIAGNOSIS — D649 Anemia, unspecified: Secondary | ICD-10-CM | POA: Diagnosis not present

## 2021-03-11 DIAGNOSIS — Z9884 Bariatric surgery status: Secondary | ICD-10-CM | POA: Diagnosis not present

## 2021-03-11 DIAGNOSIS — L03011 Cellulitis of right finger: Secondary | ICD-10-CM | POA: Diagnosis not present

## 2021-03-11 DIAGNOSIS — I272 Pulmonary hypertension, unspecified: Secondary | ICD-10-CM | POA: Diagnosis not present

## 2021-03-11 DIAGNOSIS — E1122 Type 2 diabetes mellitus with diabetic chronic kidney disease: Secondary | ICD-10-CM | POA: Diagnosis not present

## 2021-03-11 DIAGNOSIS — K219 Gastro-esophageal reflux disease without esophagitis: Secondary | ICD-10-CM | POA: Diagnosis not present

## 2021-03-12 ENCOUNTER — Ambulatory Visit: Payer: Medicare Other | Admitting: Podiatry

## 2021-03-13 DIAGNOSIS — E785 Hyperlipidemia, unspecified: Secondary | ICD-10-CM | POA: Diagnosis not present

## 2021-03-13 DIAGNOSIS — I2510 Atherosclerotic heart disease of native coronary artery without angina pectoris: Secondary | ICD-10-CM | POA: Diagnosis not present

## 2021-03-13 DIAGNOSIS — M810 Age-related osteoporosis without current pathological fracture: Secondary | ICD-10-CM | POA: Diagnosis not present

## 2021-03-13 DIAGNOSIS — I272 Pulmonary hypertension, unspecified: Secondary | ICD-10-CM | POA: Diagnosis not present

## 2021-03-13 DIAGNOSIS — Z794 Long term (current) use of insulin: Secondary | ICD-10-CM | POA: Diagnosis not present

## 2021-03-13 DIAGNOSIS — I129 Hypertensive chronic kidney disease with stage 1 through stage 4 chronic kidney disease, or unspecified chronic kidney disease: Secondary | ICD-10-CM | POA: Diagnosis not present

## 2021-03-13 DIAGNOSIS — M7501 Adhesive capsulitis of right shoulder: Secondary | ICD-10-CM | POA: Diagnosis not present

## 2021-03-13 DIAGNOSIS — J479 Bronchiectasis, uncomplicated: Secondary | ICD-10-CM | POA: Diagnosis not present

## 2021-03-13 DIAGNOSIS — I351 Nonrheumatic aortic (valve) insufficiency: Secondary | ICD-10-CM | POA: Diagnosis not present

## 2021-03-13 DIAGNOSIS — K219 Gastro-esophageal reflux disease without esophagitis: Secondary | ICD-10-CM | POA: Diagnosis not present

## 2021-03-13 DIAGNOSIS — D649 Anemia, unspecified: Secondary | ICD-10-CM | POA: Diagnosis not present

## 2021-03-13 DIAGNOSIS — N184 Chronic kidney disease, stage 4 (severe): Secondary | ICD-10-CM | POA: Diagnosis not present

## 2021-03-13 DIAGNOSIS — E1151 Type 2 diabetes mellitus with diabetic peripheral angiopathy without gangrene: Secondary | ICD-10-CM | POA: Diagnosis not present

## 2021-03-13 DIAGNOSIS — E1122 Type 2 diabetes mellitus with diabetic chronic kidney disease: Secondary | ICD-10-CM | POA: Diagnosis not present

## 2021-03-13 DIAGNOSIS — L03011 Cellulitis of right finger: Secondary | ICD-10-CM | POA: Diagnosis not present

## 2021-03-13 DIAGNOSIS — Z9884 Bariatric surgery status: Secondary | ICD-10-CM | POA: Diagnosis not present

## 2021-03-14 ENCOUNTER — Encounter (HOSPITAL_COMMUNITY): Payer: Medicare Other

## 2021-03-14 DIAGNOSIS — Z794 Long term (current) use of insulin: Secondary | ICD-10-CM | POA: Diagnosis not present

## 2021-03-14 DIAGNOSIS — I272 Pulmonary hypertension, unspecified: Secondary | ICD-10-CM | POA: Diagnosis not present

## 2021-03-14 DIAGNOSIS — M810 Age-related osteoporosis without current pathological fracture: Secondary | ICD-10-CM | POA: Diagnosis not present

## 2021-03-14 DIAGNOSIS — L03011 Cellulitis of right finger: Secondary | ICD-10-CM | POA: Diagnosis not present

## 2021-03-14 DIAGNOSIS — I351 Nonrheumatic aortic (valve) insufficiency: Secondary | ICD-10-CM | POA: Diagnosis not present

## 2021-03-14 DIAGNOSIS — K219 Gastro-esophageal reflux disease without esophagitis: Secondary | ICD-10-CM | POA: Diagnosis not present

## 2021-03-14 DIAGNOSIS — N184 Chronic kidney disease, stage 4 (severe): Secondary | ICD-10-CM | POA: Diagnosis not present

## 2021-03-14 DIAGNOSIS — D649 Anemia, unspecified: Secondary | ICD-10-CM | POA: Diagnosis not present

## 2021-03-14 DIAGNOSIS — E1122 Type 2 diabetes mellitus with diabetic chronic kidney disease: Secondary | ICD-10-CM | POA: Diagnosis not present

## 2021-03-14 DIAGNOSIS — I2510 Atherosclerotic heart disease of native coronary artery without angina pectoris: Secondary | ICD-10-CM | POA: Diagnosis not present

## 2021-03-14 DIAGNOSIS — Z9884 Bariatric surgery status: Secondary | ICD-10-CM | POA: Diagnosis not present

## 2021-03-14 DIAGNOSIS — E1151 Type 2 diabetes mellitus with diabetic peripheral angiopathy without gangrene: Secondary | ICD-10-CM | POA: Diagnosis not present

## 2021-03-14 DIAGNOSIS — M7501 Adhesive capsulitis of right shoulder: Secondary | ICD-10-CM | POA: Diagnosis not present

## 2021-03-14 DIAGNOSIS — I129 Hypertensive chronic kidney disease with stage 1 through stage 4 chronic kidney disease, or unspecified chronic kidney disease: Secondary | ICD-10-CM | POA: Diagnosis not present

## 2021-03-14 DIAGNOSIS — J479 Bronchiectasis, uncomplicated: Secondary | ICD-10-CM | POA: Diagnosis not present

## 2021-03-14 DIAGNOSIS — E785 Hyperlipidemia, unspecified: Secondary | ICD-10-CM | POA: Diagnosis not present

## 2021-03-15 IMAGING — DX DG ABD PORTABLE 1V
1 series · 2 of 2 positions shown · non-contrast
Comparison: None.

CLINICAL DATA: Enteric catheter placement

EXAM:
PORTABLE ABDOMEN - 1 VIEW

[Series 1: abdomen · 0.14mm/px · 2 of 2 slices shown]
[im 1/2]
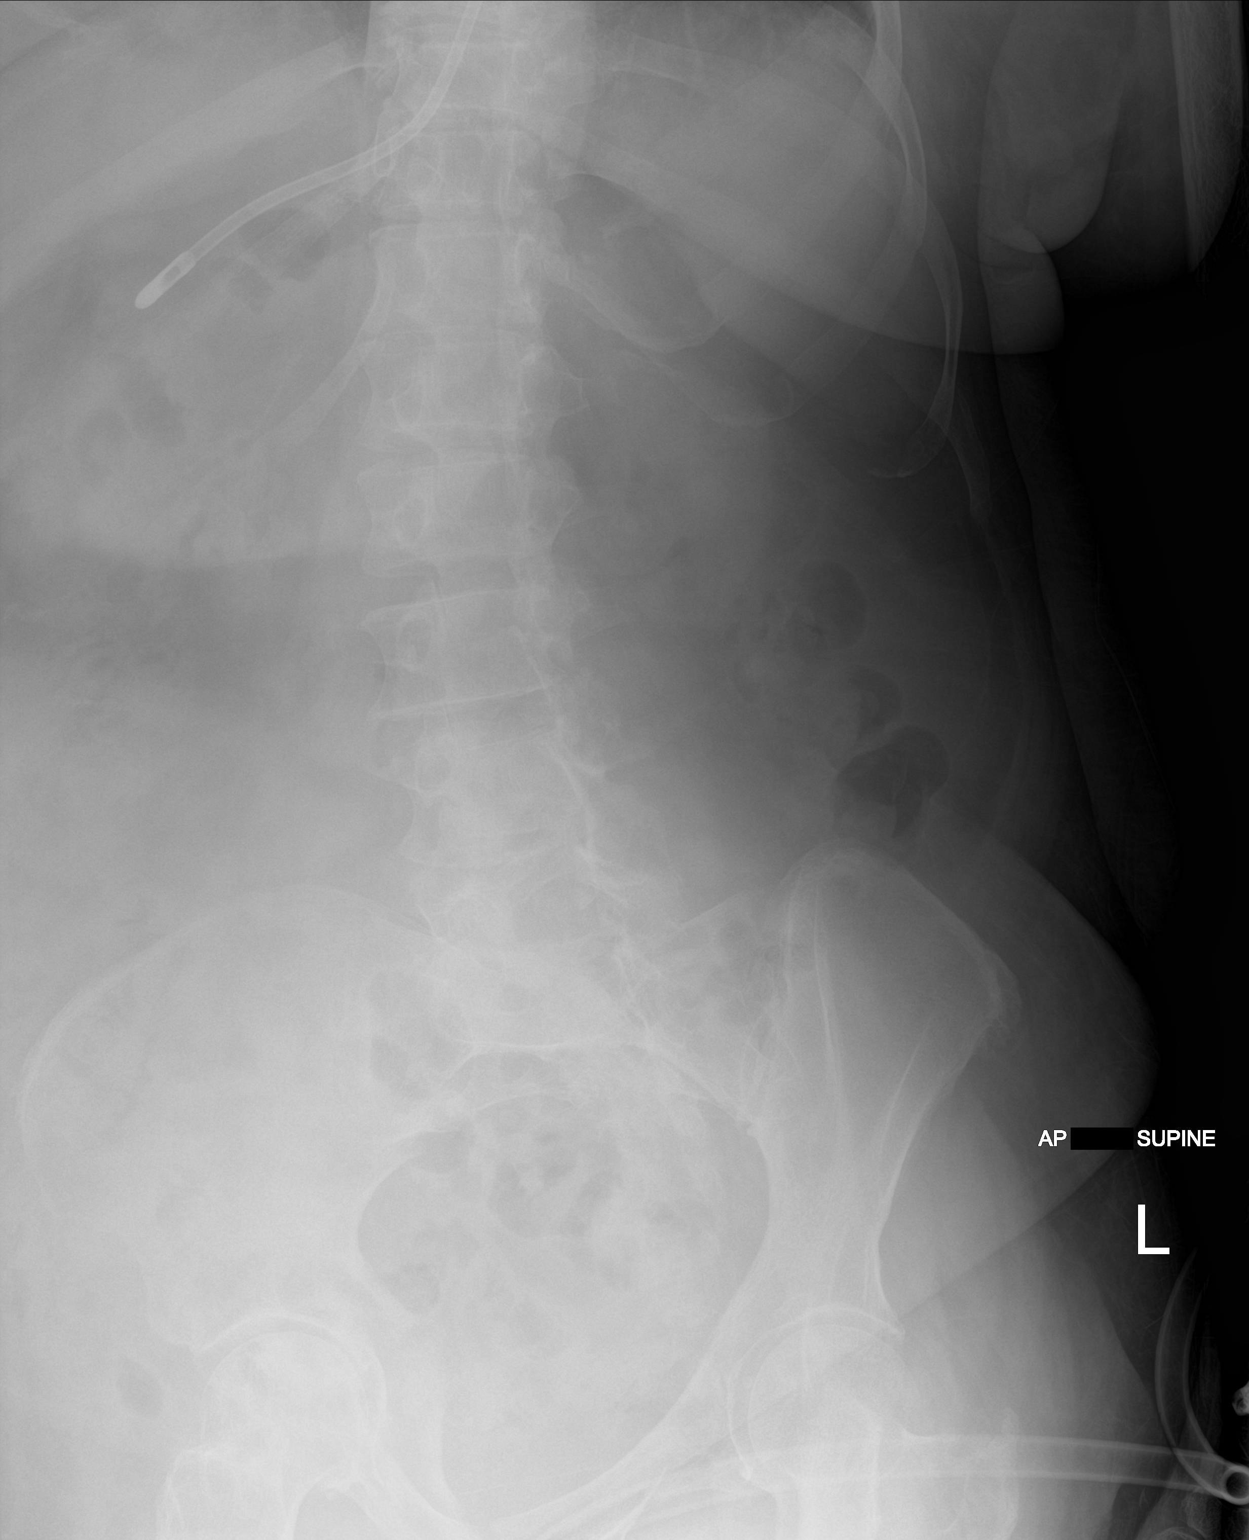
[im 2/2]
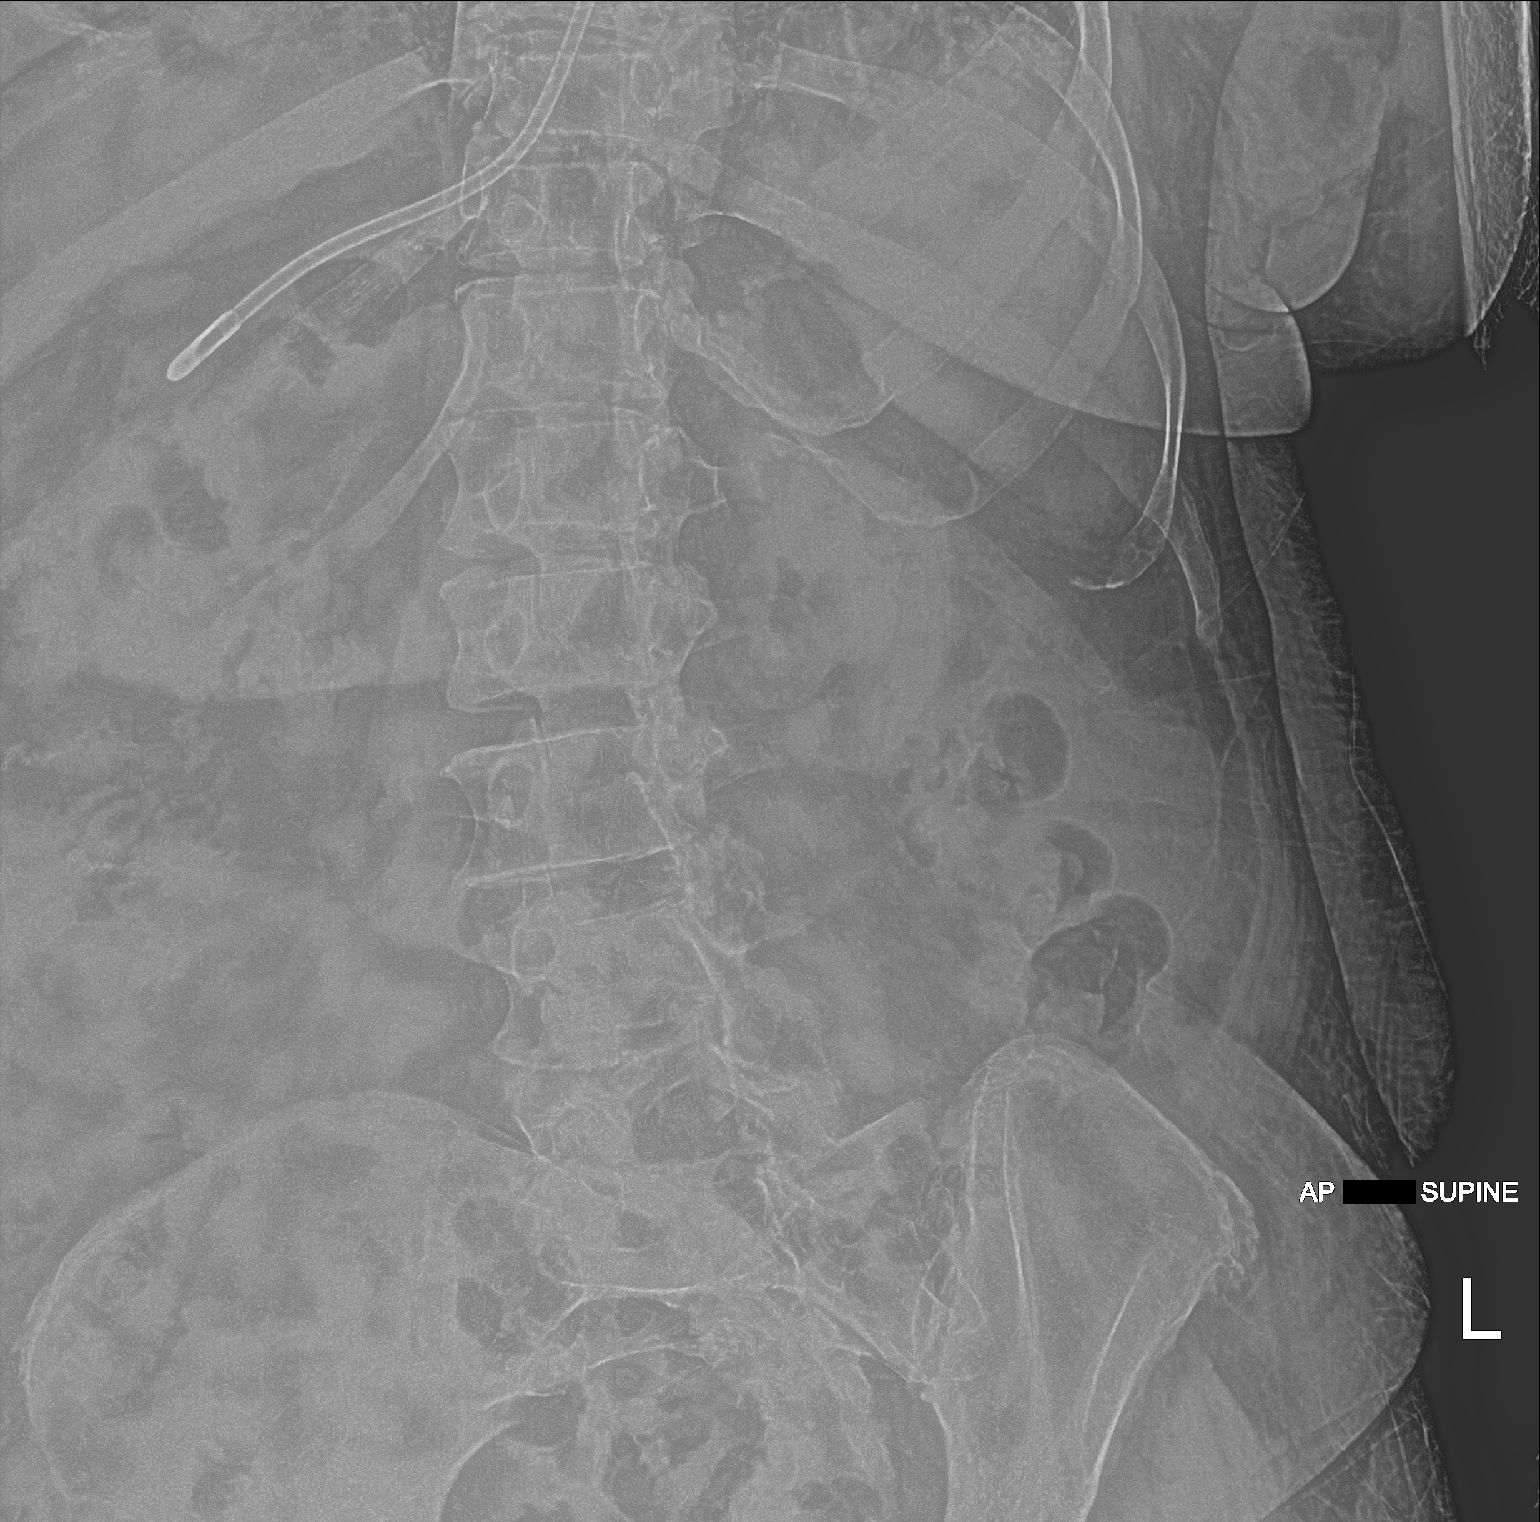

[2 of 2 positions shown; findings below may reference images not displayed]

FINDINGS: Two frontal views of the abdomen and pelvis are obtained, excluding
the hemidiaphragms and right flank by collimation. The tip of an
enteric feeding catheter overlies the gastric antrum. Bowel gas
pattern is unremarkable.
IMPRESSION: 1. Enteric catheter tip projects over gastric antrum.

## 2021-03-15 IMAGING — RF DG SPINAL PUNCT LUMBAR DIAG WITH FL CT GUIDANCE
1 series · 1 of 1 positions shown · non-contrast
Comparison: none

CLINICAL DATA: Altered mental status

[Series 1: cp_standard · 0.18mm/px · 1 of 1 slices shown]
[im 1/1]
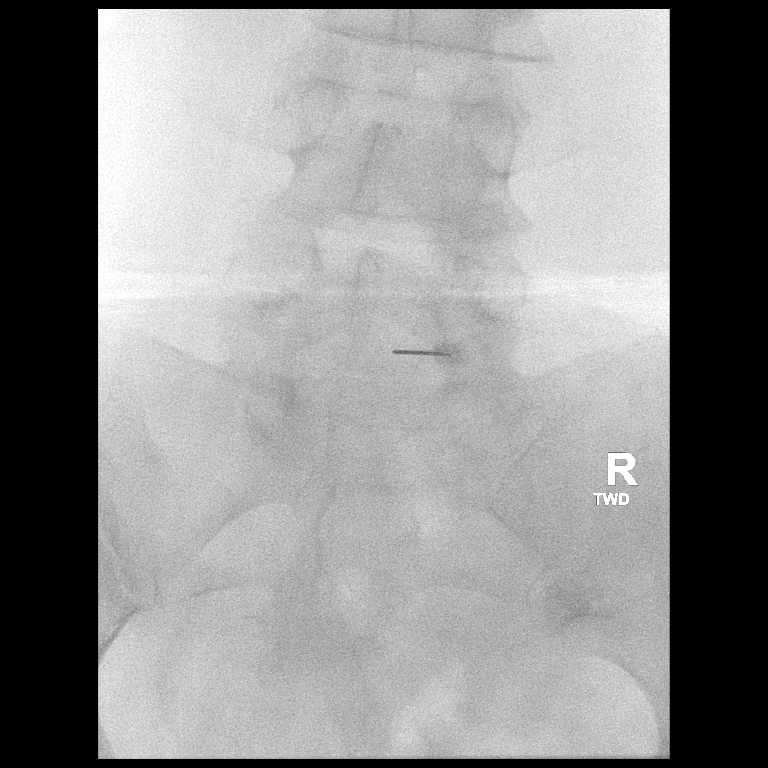

[1 of 1 positions shown; findings below may reference images not displayed]

EXAM:
DIAGNOSTIC LUMBAR PUNCTURE UNDER FLUOROSCOPIC GUIDANCE

FLUOROSCOPY TIME:  Fluoroscopy Time:  0 minutes, 24 seconds

Radiation Exposure Index (if provided by the fluoroscopic device):
4.8 mGy

Number of Acquired Spot Images: 0

PROCEDURE:
I discussed the risks (including hemorrhage, infection, and nerve
damage, among others), benefits, and alternatives to
fluoroscopically guided lumbar puncture with patient's daughter
in-person, as the patient is unable to consent and has altered
mental status. We specifically discussed the high likelihood of
technical success of the procedure. The patient's daughter
understood and elected for the patient to undergo the procedure.

Standard time-out was employed. Following sterile skin prep and
local anesthetic administration consisting of 1 percent lidocaine, a
20 gauge spinal needle was advanced without difficulty into the
thecal sac at the L5-S1 level under fluoroscopic guidance. Clear CSF
was returned. Because of the altered mental status and mild patient
agitation, I did not turn her onto her side to obtain a pressure
measurement.

A total of 12 cc of clear CSF was collected in 4 vials. The needle
was subsequently removed and the skin cleansed and bandaged. No
immediate complications were observed.
IMPRESSION: 1. Technically successful fluoroscopically guided lumbar puncture at
the L5-S1 level yielding 12 cc of clear CSF.

## 2021-03-18 ENCOUNTER — Encounter: Payer: Self-pay | Admitting: Podiatry

## 2021-03-18 ENCOUNTER — Other Ambulatory Visit: Payer: Self-pay

## 2021-03-18 ENCOUNTER — Ambulatory Visit: Payer: Medicare Other | Admitting: Podiatry

## 2021-03-18 DIAGNOSIS — N184 Chronic kidney disease, stage 4 (severe): Secondary | ICD-10-CM

## 2021-03-18 DIAGNOSIS — M79674 Pain in right toe(s): Secondary | ICD-10-CM

## 2021-03-18 DIAGNOSIS — M79675 Pain in left toe(s): Secondary | ICD-10-CM | POA: Diagnosis not present

## 2021-03-18 DIAGNOSIS — E1122 Type 2 diabetes mellitus with diabetic chronic kidney disease: Secondary | ICD-10-CM | POA: Diagnosis not present

## 2021-03-18 DIAGNOSIS — B351 Tinea unguium: Secondary | ICD-10-CM | POA: Diagnosis not present

## 2021-03-18 DIAGNOSIS — L84 Corns and callosities: Secondary | ICD-10-CM | POA: Diagnosis not present

## 2021-03-19 DIAGNOSIS — D649 Anemia, unspecified: Secondary | ICD-10-CM | POA: Diagnosis not present

## 2021-03-19 DIAGNOSIS — I2510 Atherosclerotic heart disease of native coronary artery without angina pectoris: Secondary | ICD-10-CM | POA: Diagnosis not present

## 2021-03-19 DIAGNOSIS — I272 Pulmonary hypertension, unspecified: Secondary | ICD-10-CM | POA: Diagnosis not present

## 2021-03-19 DIAGNOSIS — E785 Hyperlipidemia, unspecified: Secondary | ICD-10-CM | POA: Diagnosis not present

## 2021-03-19 DIAGNOSIS — E1122 Type 2 diabetes mellitus with diabetic chronic kidney disease: Secondary | ICD-10-CM | POA: Diagnosis not present

## 2021-03-19 DIAGNOSIS — Z9884 Bariatric surgery status: Secondary | ICD-10-CM | POA: Diagnosis not present

## 2021-03-19 DIAGNOSIS — I129 Hypertensive chronic kidney disease with stage 1 through stage 4 chronic kidney disease, or unspecified chronic kidney disease: Secondary | ICD-10-CM | POA: Diagnosis not present

## 2021-03-19 DIAGNOSIS — N184 Chronic kidney disease, stage 4 (severe): Secondary | ICD-10-CM | POA: Diagnosis not present

## 2021-03-19 DIAGNOSIS — I351 Nonrheumatic aortic (valve) insufficiency: Secondary | ICD-10-CM | POA: Diagnosis not present

## 2021-03-19 DIAGNOSIS — K219 Gastro-esophageal reflux disease without esophagitis: Secondary | ICD-10-CM | POA: Diagnosis not present

## 2021-03-19 DIAGNOSIS — M7501 Adhesive capsulitis of right shoulder: Secondary | ICD-10-CM | POA: Diagnosis not present

## 2021-03-19 DIAGNOSIS — J479 Bronchiectasis, uncomplicated: Secondary | ICD-10-CM | POA: Diagnosis not present

## 2021-03-19 DIAGNOSIS — M810 Age-related osteoporosis without current pathological fracture: Secondary | ICD-10-CM | POA: Diagnosis not present

## 2021-03-19 DIAGNOSIS — Z794 Long term (current) use of insulin: Secondary | ICD-10-CM | POA: Diagnosis not present

## 2021-03-19 DIAGNOSIS — E1151 Type 2 diabetes mellitus with diabetic peripheral angiopathy without gangrene: Secondary | ICD-10-CM | POA: Diagnosis not present

## 2021-03-19 DIAGNOSIS — L03011 Cellulitis of right finger: Secondary | ICD-10-CM | POA: Diagnosis not present

## 2021-03-20 ENCOUNTER — Encounter: Payer: Self-pay | Admitting: Internal Medicine

## 2021-03-20 DIAGNOSIS — E785 Hyperlipidemia, unspecified: Secondary | ICD-10-CM | POA: Diagnosis not present

## 2021-03-20 DIAGNOSIS — E1122 Type 2 diabetes mellitus with diabetic chronic kidney disease: Secondary | ICD-10-CM | POA: Diagnosis not present

## 2021-03-20 DIAGNOSIS — Z9884 Bariatric surgery status: Secondary | ICD-10-CM | POA: Diagnosis not present

## 2021-03-20 DIAGNOSIS — L03011 Cellulitis of right finger: Secondary | ICD-10-CM | POA: Diagnosis not present

## 2021-03-20 DIAGNOSIS — I2510 Atherosclerotic heart disease of native coronary artery without angina pectoris: Secondary | ICD-10-CM | POA: Diagnosis not present

## 2021-03-20 DIAGNOSIS — K219 Gastro-esophageal reflux disease without esophagitis: Secondary | ICD-10-CM | POA: Diagnosis not present

## 2021-03-20 DIAGNOSIS — Z794 Long term (current) use of insulin: Secondary | ICD-10-CM | POA: Diagnosis not present

## 2021-03-20 DIAGNOSIS — D649 Anemia, unspecified: Secondary | ICD-10-CM | POA: Diagnosis not present

## 2021-03-20 DIAGNOSIS — M810 Age-related osteoporosis without current pathological fracture: Secondary | ICD-10-CM | POA: Diagnosis not present

## 2021-03-20 DIAGNOSIS — J479 Bronchiectasis, uncomplicated: Secondary | ICD-10-CM | POA: Diagnosis not present

## 2021-03-20 DIAGNOSIS — E1151 Type 2 diabetes mellitus with diabetic peripheral angiopathy without gangrene: Secondary | ICD-10-CM | POA: Diagnosis not present

## 2021-03-20 DIAGNOSIS — M7501 Adhesive capsulitis of right shoulder: Secondary | ICD-10-CM | POA: Diagnosis not present

## 2021-03-20 DIAGNOSIS — I272 Pulmonary hypertension, unspecified: Secondary | ICD-10-CM | POA: Diagnosis not present

## 2021-03-20 DIAGNOSIS — N184 Chronic kidney disease, stage 4 (severe): Secondary | ICD-10-CM | POA: Diagnosis not present

## 2021-03-20 DIAGNOSIS — I129 Hypertensive chronic kidney disease with stage 1 through stage 4 chronic kidney disease, or unspecified chronic kidney disease: Secondary | ICD-10-CM | POA: Diagnosis not present

## 2021-03-20 DIAGNOSIS — I351 Nonrheumatic aortic (valve) insufficiency: Secondary | ICD-10-CM | POA: Diagnosis not present

## 2021-03-21 NOTE — Progress Notes (Signed)
Subjective: Miranda Roman is a pleasant 75 y.o. female patient seen today for at risk foot care. Pt has h/o NIDDM with chronic kidney disease and callus(es) b/l feet and painful thick toenails that are difficult to trim. Painful toenails interfere with ambulation. Aggravating factors include wearing enclosed shoe gear. Pain is relieved with periodic professional debridement. Painful calluses are aggravated when weightbearing with and without shoegear. Pain is relieved with periodic professional debridement.   Patient states their blood glucose was 102 mg/dl today.  PCP is Glendale Chard, MD. Last visit was: 02/04/2021.  She notes no new pedal problems today.  Allergies  Allergen Reactions   Cefepime Other (See Comments)    Causes encephalitis - reported by [facility] 12/31/2019   Lactose Intolerance (Gi)     Per pt's daughter causes gas    Objective: Physical Exam  General: Miranda Roman is a pleasant 75 y.o. African American female, in NAD. AAO x 3.   Vascular:  Capillary refill time to digits immediate b/l. Palpable pedal pulses b/l LE. Pedal hair absent. Lower extremity skin temperature gradient within normal limits.  Dermatological:  Skin warm and supple b/l lower extremities. No open wounds b/l lower extremities. No interdigital macerations b/l lower extremities. Toenails 1-5 b/l elongated, discolored, dystrophic, thickened, crumbly with subungual debris and tenderness to dorsal palpation. Hyperkeratotic lesion(s) submet head 5 left foot and submet head 5 right foot.  No erythema, no edema, no drainage, no fluctuance.  Musculoskeletal:  Normal muscle strength 5/5 to all lower extremity muscle groups bilaterally. Tailor's bunion deformity noted b/l lower extremities.  Neurological:  Protective sensation intact 5/5 intact bilaterally with 10g monofilament b/l. Vibratory sensation intact b/l.  Assessment and Plan:  1. Pain due to onychomycosis of toenails of both feet    2. Callus   3. Diabetes mellitus with stage 4 chronic kidney disease ([place])      -Examined patient. -Continue diabetic foot care principles: inspect feet daily, monitor glucose as recommended by PCP and/or Endocrinologist, and follow prescribed diet per PCP, Endocrinologist and/or dietician. -Patient to continue soft, supportive shoe gear daily. -Toenails 1-5 b/l were debrided in length and girth with sterile nail nippers and dremel without iatrogenic bleeding.  -Callus(es) submet head 5 left foot and submet head 5 right foot pared utilizing sterile scalpel blade without complication or incident. Total number debrided =2. -Patient to report any pedal injuries to medical professional immediately. -Patient/POA to call should there be question/concern in the interim.  Return in about 3 months (around 06/17/2021).  Marzetta Board, DPM

## 2021-03-26 DIAGNOSIS — E785 Hyperlipidemia, unspecified: Secondary | ICD-10-CM | POA: Diagnosis not present

## 2021-03-26 DIAGNOSIS — M810 Age-related osteoporosis without current pathological fracture: Secondary | ICD-10-CM | POA: Diagnosis not present

## 2021-03-26 DIAGNOSIS — K219 Gastro-esophageal reflux disease without esophagitis: Secondary | ICD-10-CM | POA: Diagnosis not present

## 2021-03-26 DIAGNOSIS — I351 Nonrheumatic aortic (valve) insufficiency: Secondary | ICD-10-CM | POA: Diagnosis not present

## 2021-03-26 DIAGNOSIS — I2510 Atherosclerotic heart disease of native coronary artery without angina pectoris: Secondary | ICD-10-CM | POA: Diagnosis not present

## 2021-03-26 DIAGNOSIS — N184 Chronic kidney disease, stage 4 (severe): Secondary | ICD-10-CM | POA: Diagnosis not present

## 2021-03-26 DIAGNOSIS — Z9884 Bariatric surgery status: Secondary | ICD-10-CM | POA: Diagnosis not present

## 2021-03-26 DIAGNOSIS — I272 Pulmonary hypertension, unspecified: Secondary | ICD-10-CM | POA: Diagnosis not present

## 2021-03-26 DIAGNOSIS — E1151 Type 2 diabetes mellitus with diabetic peripheral angiopathy without gangrene: Secondary | ICD-10-CM | POA: Diagnosis not present

## 2021-03-26 DIAGNOSIS — L03011 Cellulitis of right finger: Secondary | ICD-10-CM | POA: Diagnosis not present

## 2021-03-26 DIAGNOSIS — I129 Hypertensive chronic kidney disease with stage 1 through stage 4 chronic kidney disease, or unspecified chronic kidney disease: Secondary | ICD-10-CM | POA: Diagnosis not present

## 2021-03-26 DIAGNOSIS — D649 Anemia, unspecified: Secondary | ICD-10-CM | POA: Diagnosis not present

## 2021-03-26 DIAGNOSIS — M7501 Adhesive capsulitis of right shoulder: Secondary | ICD-10-CM | POA: Diagnosis not present

## 2021-03-26 DIAGNOSIS — Z794 Long term (current) use of insulin: Secondary | ICD-10-CM | POA: Diagnosis not present

## 2021-03-26 DIAGNOSIS — J479 Bronchiectasis, uncomplicated: Secondary | ICD-10-CM | POA: Diagnosis not present

## 2021-03-26 DIAGNOSIS — E1122 Type 2 diabetes mellitus with diabetic chronic kidney disease: Secondary | ICD-10-CM | POA: Diagnosis not present

## 2021-03-27 DIAGNOSIS — Z794 Long term (current) use of insulin: Secondary | ICD-10-CM | POA: Diagnosis not present

## 2021-03-27 DIAGNOSIS — M7501 Adhesive capsulitis of right shoulder: Secondary | ICD-10-CM | POA: Diagnosis not present

## 2021-03-27 DIAGNOSIS — E1122 Type 2 diabetes mellitus with diabetic chronic kidney disease: Secondary | ICD-10-CM | POA: Diagnosis not present

## 2021-03-27 DIAGNOSIS — K219 Gastro-esophageal reflux disease without esophagitis: Secondary | ICD-10-CM | POA: Diagnosis not present

## 2021-03-27 DIAGNOSIS — E785 Hyperlipidemia, unspecified: Secondary | ICD-10-CM | POA: Diagnosis not present

## 2021-03-27 DIAGNOSIS — J479 Bronchiectasis, uncomplicated: Secondary | ICD-10-CM | POA: Diagnosis not present

## 2021-03-27 DIAGNOSIS — Z9884 Bariatric surgery status: Secondary | ICD-10-CM | POA: Diagnosis not present

## 2021-03-27 DIAGNOSIS — I2510 Atherosclerotic heart disease of native coronary artery without angina pectoris: Secondary | ICD-10-CM | POA: Diagnosis not present

## 2021-03-27 DIAGNOSIS — I351 Nonrheumatic aortic (valve) insufficiency: Secondary | ICD-10-CM | POA: Diagnosis not present

## 2021-03-27 DIAGNOSIS — L03011 Cellulitis of right finger: Secondary | ICD-10-CM | POA: Diagnosis not present

## 2021-03-27 DIAGNOSIS — I272 Pulmonary hypertension, unspecified: Secondary | ICD-10-CM | POA: Diagnosis not present

## 2021-03-27 DIAGNOSIS — M810 Age-related osteoporosis without current pathological fracture: Secondary | ICD-10-CM | POA: Diagnosis not present

## 2021-03-27 DIAGNOSIS — E1151 Type 2 diabetes mellitus with diabetic peripheral angiopathy without gangrene: Secondary | ICD-10-CM | POA: Diagnosis not present

## 2021-03-27 DIAGNOSIS — D649 Anemia, unspecified: Secondary | ICD-10-CM | POA: Diagnosis not present

## 2021-03-27 DIAGNOSIS — N184 Chronic kidney disease, stage 4 (severe): Secondary | ICD-10-CM | POA: Diagnosis not present

## 2021-03-27 DIAGNOSIS — I129 Hypertensive chronic kidney disease with stage 1 through stage 4 chronic kidney disease, or unspecified chronic kidney disease: Secondary | ICD-10-CM | POA: Diagnosis not present

## 2021-03-27 LAB — HM PAP SMEAR

## 2021-03-28 ENCOUNTER — Encounter (HOSPITAL_COMMUNITY)
Admission: RE | Admit: 2021-03-28 | Discharge: 2021-03-28 | Disposition: A | Discharge status: Home / Self Care | Payer: Medicare Other | Source: Ambulatory Visit | Attending: Nephrology | Admitting: Nephrology

## 2021-03-28 ENCOUNTER — Encounter: Payer: Self-pay | Admitting: Internal Medicine

## 2021-03-28 ENCOUNTER — Other Ambulatory Visit: Payer: Self-pay

## 2021-03-28 VITALS — BP 143/65 | HR 63 | Resp 20

## 2021-03-28 DIAGNOSIS — Z794 Long term (current) use of insulin: Secondary | ICD-10-CM | POA: Diagnosis not present

## 2021-03-28 DIAGNOSIS — E1151 Type 2 diabetes mellitus with diabetic peripheral angiopathy without gangrene: Secondary | ICD-10-CM | POA: Diagnosis not present

## 2021-03-28 DIAGNOSIS — I2510 Atherosclerotic heart disease of native coronary artery without angina pectoris: Secondary | ICD-10-CM | POA: Diagnosis not present

## 2021-03-28 DIAGNOSIS — I272 Pulmonary hypertension, unspecified: Secondary | ICD-10-CM | POA: Diagnosis not present

## 2021-03-28 DIAGNOSIS — E1122 Type 2 diabetes mellitus with diabetic chronic kidney disease: Secondary | ICD-10-CM | POA: Insufficient documentation

## 2021-03-28 DIAGNOSIS — I129 Hypertensive chronic kidney disease with stage 1 through stage 4 chronic kidney disease, or unspecified chronic kidney disease: Secondary | ICD-10-CM | POA: Diagnosis not present

## 2021-03-28 DIAGNOSIS — D649 Anemia, unspecified: Secondary | ICD-10-CM | POA: Diagnosis not present

## 2021-03-28 DIAGNOSIS — N184 Chronic kidney disease, stage 4 (severe): Secondary | ICD-10-CM | POA: Diagnosis not present

## 2021-03-28 DIAGNOSIS — J479 Bronchiectasis, uncomplicated: Secondary | ICD-10-CM | POA: Diagnosis not present

## 2021-03-28 DIAGNOSIS — K219 Gastro-esophageal reflux disease without esophagitis: Secondary | ICD-10-CM | POA: Diagnosis not present

## 2021-03-28 DIAGNOSIS — Z9884 Bariatric surgery status: Secondary | ICD-10-CM | POA: Diagnosis not present

## 2021-03-28 DIAGNOSIS — L03011 Cellulitis of right finger: Secondary | ICD-10-CM | POA: Diagnosis not present

## 2021-03-28 DIAGNOSIS — E785 Hyperlipidemia, unspecified: Secondary | ICD-10-CM | POA: Diagnosis not present

## 2021-03-28 DIAGNOSIS — M7501 Adhesive capsulitis of right shoulder: Secondary | ICD-10-CM | POA: Diagnosis not present

## 2021-03-28 DIAGNOSIS — M810 Age-related osteoporosis without current pathological fracture: Secondary | ICD-10-CM | POA: Diagnosis not present

## 2021-03-28 DIAGNOSIS — I351 Nonrheumatic aortic (valve) insufficiency: Secondary | ICD-10-CM | POA: Diagnosis not present

## 2021-03-28 LAB — FERRITIN: Ferritin: 206 ng/mL (ref 11–307)

## 2021-03-28 LAB — IRON AND TIBC
Iron: 79 ug/dL (ref 28–170)
Saturation Ratios: 33 % — ABNORMAL HIGH (ref 10.4–31.8)
TIBC: 241 ug/dL — ABNORMAL LOW (ref 250–450)
UIBC: 162 ug/dL

## 2021-03-28 LAB — POCT HEMOGLOBIN-HEMACUE: Hemoglobin: 9.6 g/dL — ABNORMAL LOW (ref 12.0–15.0)

## 2021-03-28 MED ORDER — EPOETIN ALFA-EPBX 10000 UNIT/ML IJ SOLN
10000.0000 [IU] | INTRAMUSCULAR | Status: DC
  Administered 2021-03-28: 10000 [IU] via SUBCUTANEOUS

## 2021-03-28 MED ORDER — EPOETIN ALFA-EPBX 10000 UNIT/ML IJ SOLN
INTRAMUSCULAR | Status: AC
  Filled 2021-03-28: qty 1

## 2021-03-31 DIAGNOSIS — L03011 Cellulitis of right finger: Secondary | ICD-10-CM | POA: Diagnosis not present

## 2021-03-31 DIAGNOSIS — M7501 Adhesive capsulitis of right shoulder: Secondary | ICD-10-CM | POA: Diagnosis not present

## 2021-03-31 DIAGNOSIS — M810 Age-related osteoporosis without current pathological fracture: Secondary | ICD-10-CM | POA: Diagnosis not present

## 2021-03-31 DIAGNOSIS — I2510 Atherosclerotic heart disease of native coronary artery without angina pectoris: Secondary | ICD-10-CM | POA: Diagnosis not present

## 2021-03-31 DIAGNOSIS — K219 Gastro-esophageal reflux disease without esophagitis: Secondary | ICD-10-CM | POA: Diagnosis not present

## 2021-03-31 DIAGNOSIS — I351 Nonrheumatic aortic (valve) insufficiency: Secondary | ICD-10-CM | POA: Diagnosis not present

## 2021-03-31 DIAGNOSIS — Z9884 Bariatric surgery status: Secondary | ICD-10-CM | POA: Diagnosis not present

## 2021-03-31 DIAGNOSIS — E785 Hyperlipidemia, unspecified: Secondary | ICD-10-CM | POA: Diagnosis not present

## 2021-03-31 DIAGNOSIS — I129 Hypertensive chronic kidney disease with stage 1 through stage 4 chronic kidney disease, or unspecified chronic kidney disease: Secondary | ICD-10-CM | POA: Diagnosis not present

## 2021-03-31 DIAGNOSIS — E1122 Type 2 diabetes mellitus with diabetic chronic kidney disease: Secondary | ICD-10-CM | POA: Diagnosis not present

## 2021-03-31 DIAGNOSIS — E1151 Type 2 diabetes mellitus with diabetic peripheral angiopathy without gangrene: Secondary | ICD-10-CM | POA: Diagnosis not present

## 2021-03-31 DIAGNOSIS — I272 Pulmonary hypertension, unspecified: Secondary | ICD-10-CM | POA: Diagnosis not present

## 2021-03-31 DIAGNOSIS — J479 Bronchiectasis, uncomplicated: Secondary | ICD-10-CM | POA: Diagnosis not present

## 2021-03-31 DIAGNOSIS — Z794 Long term (current) use of insulin: Secondary | ICD-10-CM | POA: Diagnosis not present

## 2021-03-31 DIAGNOSIS — D649 Anemia, unspecified: Secondary | ICD-10-CM | POA: Diagnosis not present

## 2021-03-31 DIAGNOSIS — N184 Chronic kidney disease, stage 4 (severe): Secondary | ICD-10-CM | POA: Diagnosis not present

## 2021-04-01 DIAGNOSIS — I272 Pulmonary hypertension, unspecified: Secondary | ICD-10-CM | POA: Diagnosis not present

## 2021-04-01 DIAGNOSIS — Z794 Long term (current) use of insulin: Secondary | ICD-10-CM | POA: Diagnosis not present

## 2021-04-01 DIAGNOSIS — M810 Age-related osteoporosis without current pathological fracture: Secondary | ICD-10-CM | POA: Diagnosis not present

## 2021-04-01 DIAGNOSIS — E1122 Type 2 diabetes mellitus with diabetic chronic kidney disease: Secondary | ICD-10-CM | POA: Diagnosis not present

## 2021-04-01 DIAGNOSIS — L03011 Cellulitis of right finger: Secondary | ICD-10-CM | POA: Diagnosis not present

## 2021-04-01 DIAGNOSIS — K219 Gastro-esophageal reflux disease without esophagitis: Secondary | ICD-10-CM | POA: Diagnosis not present

## 2021-04-01 DIAGNOSIS — E1151 Type 2 diabetes mellitus with diabetic peripheral angiopathy without gangrene: Secondary | ICD-10-CM | POA: Diagnosis not present

## 2021-04-01 DIAGNOSIS — D649 Anemia, unspecified: Secondary | ICD-10-CM | POA: Diagnosis not present

## 2021-04-01 DIAGNOSIS — J479 Bronchiectasis, uncomplicated: Secondary | ICD-10-CM | POA: Diagnosis not present

## 2021-04-01 DIAGNOSIS — E785 Hyperlipidemia, unspecified: Secondary | ICD-10-CM | POA: Diagnosis not present

## 2021-04-01 DIAGNOSIS — I129 Hypertensive chronic kidney disease with stage 1 through stage 4 chronic kidney disease, or unspecified chronic kidney disease: Secondary | ICD-10-CM | POA: Diagnosis not present

## 2021-04-01 DIAGNOSIS — Z9884 Bariatric surgery status: Secondary | ICD-10-CM | POA: Diagnosis not present

## 2021-04-01 DIAGNOSIS — I2510 Atherosclerotic heart disease of native coronary artery without angina pectoris: Secondary | ICD-10-CM | POA: Diagnosis not present

## 2021-04-01 DIAGNOSIS — N184 Chronic kidney disease, stage 4 (severe): Secondary | ICD-10-CM | POA: Diagnosis not present

## 2021-04-01 DIAGNOSIS — M7501 Adhesive capsulitis of right shoulder: Secondary | ICD-10-CM | POA: Diagnosis not present

## 2021-04-01 DIAGNOSIS — I351 Nonrheumatic aortic (valve) insufficiency: Secondary | ICD-10-CM | POA: Diagnosis not present

## 2021-04-10 ENCOUNTER — Encounter (HOSPITAL_COMMUNITY)
Admission: RE | Admit: 2021-04-10 | Discharge: 2021-04-10 | Disposition: A | Discharge status: Home / Self Care | Payer: Medicare Other | Source: Ambulatory Visit | Attending: Nephrology | Admitting: Nephrology

## 2021-04-10 VITALS — BP 139/73 | HR 64 | Temp 97.2°F | Resp 20

## 2021-04-10 DIAGNOSIS — N184 Chronic kidney disease, stage 4 (severe): Secondary | ICD-10-CM | POA: Insufficient documentation

## 2021-04-10 DIAGNOSIS — I351 Nonrheumatic aortic (valve) insufficiency: Secondary | ICD-10-CM | POA: Diagnosis not present

## 2021-04-10 DIAGNOSIS — D649 Anemia, unspecified: Secondary | ICD-10-CM | POA: Diagnosis not present

## 2021-04-10 DIAGNOSIS — J479 Bronchiectasis, uncomplicated: Secondary | ICD-10-CM | POA: Diagnosis not present

## 2021-04-10 DIAGNOSIS — I2510 Atherosclerotic heart disease of native coronary artery without angina pectoris: Secondary | ICD-10-CM | POA: Diagnosis not present

## 2021-04-10 DIAGNOSIS — E1122 Type 2 diabetes mellitus with diabetic chronic kidney disease: Secondary | ICD-10-CM | POA: Insufficient documentation

## 2021-04-10 DIAGNOSIS — E1151 Type 2 diabetes mellitus with diabetic peripheral angiopathy without gangrene: Secondary | ICD-10-CM | POA: Diagnosis not present

## 2021-04-10 DIAGNOSIS — Z794 Long term (current) use of insulin: Secondary | ICD-10-CM | POA: Diagnosis not present

## 2021-04-10 DIAGNOSIS — M810 Age-related osteoporosis without current pathological fracture: Secondary | ICD-10-CM | POA: Diagnosis not present

## 2021-04-10 DIAGNOSIS — K219 Gastro-esophageal reflux disease without esophagitis: Secondary | ICD-10-CM | POA: Diagnosis not present

## 2021-04-10 DIAGNOSIS — I129 Hypertensive chronic kidney disease with stage 1 through stage 4 chronic kidney disease, or unspecified chronic kidney disease: Secondary | ICD-10-CM | POA: Diagnosis not present

## 2021-04-10 DIAGNOSIS — L03011 Cellulitis of right finger: Secondary | ICD-10-CM | POA: Diagnosis not present

## 2021-04-10 DIAGNOSIS — I272 Pulmonary hypertension, unspecified: Secondary | ICD-10-CM | POA: Diagnosis not present

## 2021-04-10 DIAGNOSIS — M7501 Adhesive capsulitis of right shoulder: Secondary | ICD-10-CM | POA: Diagnosis not present

## 2021-04-10 DIAGNOSIS — E785 Hyperlipidemia, unspecified: Secondary | ICD-10-CM | POA: Diagnosis not present

## 2021-04-10 DIAGNOSIS — Z9884 Bariatric surgery status: Secondary | ICD-10-CM | POA: Diagnosis not present

## 2021-04-10 LAB — POCT HEMOGLOBIN-HEMACUE: Hemoglobin: 11.4 g/dL — ABNORMAL LOW (ref 12.0–15.0)

## 2021-04-10 MED ORDER — EPOETIN ALFA-EPBX 10000 UNIT/ML IJ SOLN
10000.0000 [IU] | INTRAMUSCULAR | Status: DC
  Administered 2021-04-10: 10000 [IU] via SUBCUTANEOUS

## 2021-04-10 MED ORDER — EPOETIN ALFA-EPBX 10000 UNIT/ML IJ SOLN
INTRAMUSCULAR | Status: AC
  Filled 2021-04-10: qty 1

## 2021-04-11 ENCOUNTER — Encounter (HOSPITAL_COMMUNITY): Payer: Medicare Other

## 2021-04-15 ENCOUNTER — Other Ambulatory Visit: Payer: Self-pay | Admitting: Internal Medicine

## 2021-04-17 DIAGNOSIS — J479 Bronchiectasis, uncomplicated: Secondary | ICD-10-CM | POA: Diagnosis not present

## 2021-04-17 DIAGNOSIS — M810 Age-related osteoporosis without current pathological fracture: Secondary | ICD-10-CM | POA: Diagnosis not present

## 2021-04-17 DIAGNOSIS — K219 Gastro-esophageal reflux disease without esophagitis: Secondary | ICD-10-CM | POA: Diagnosis not present

## 2021-04-17 DIAGNOSIS — I129 Hypertensive chronic kidney disease with stage 1 through stage 4 chronic kidney disease, or unspecified chronic kidney disease: Secondary | ICD-10-CM | POA: Diagnosis not present

## 2021-04-17 DIAGNOSIS — E1151 Type 2 diabetes mellitus with diabetic peripheral angiopathy without gangrene: Secondary | ICD-10-CM | POA: Diagnosis not present

## 2021-04-17 DIAGNOSIS — E785 Hyperlipidemia, unspecified: Secondary | ICD-10-CM | POA: Diagnosis not present

## 2021-04-17 DIAGNOSIS — I272 Pulmonary hypertension, unspecified: Secondary | ICD-10-CM | POA: Diagnosis not present

## 2021-04-17 DIAGNOSIS — I351 Nonrheumatic aortic (valve) insufficiency: Secondary | ICD-10-CM | POA: Diagnosis not present

## 2021-04-17 DIAGNOSIS — M7501 Adhesive capsulitis of right shoulder: Secondary | ICD-10-CM | POA: Diagnosis not present

## 2021-04-17 DIAGNOSIS — Z9884 Bariatric surgery status: Secondary | ICD-10-CM | POA: Diagnosis not present

## 2021-04-17 DIAGNOSIS — L03011 Cellulitis of right finger: Secondary | ICD-10-CM | POA: Diagnosis not present

## 2021-04-17 DIAGNOSIS — D649 Anemia, unspecified: Secondary | ICD-10-CM | POA: Diagnosis not present

## 2021-04-17 DIAGNOSIS — Z794 Long term (current) use of insulin: Secondary | ICD-10-CM | POA: Diagnosis not present

## 2021-04-17 DIAGNOSIS — I2510 Atherosclerotic heart disease of native coronary artery without angina pectoris: Secondary | ICD-10-CM | POA: Diagnosis not present

## 2021-04-17 DIAGNOSIS — N184 Chronic kidney disease, stage 4 (severe): Secondary | ICD-10-CM | POA: Diagnosis not present

## 2021-04-17 DIAGNOSIS — E1122 Type 2 diabetes mellitus with diabetic chronic kidney disease: Secondary | ICD-10-CM | POA: Diagnosis not present

## 2021-04-25 ENCOUNTER — Encounter (HOSPITAL_COMMUNITY)
Admission: RE | Admit: 2021-04-25 | Discharge: 2021-04-25 | Disposition: A | Discharge status: Home / Self Care | Payer: Medicare Other | Source: Ambulatory Visit | Attending: Nephrology | Admitting: Nephrology

## 2021-04-25 ENCOUNTER — Other Ambulatory Visit: Payer: Self-pay

## 2021-04-25 VITALS — BP 135/64 | HR 58 | Temp 97.2°F | Resp 18

## 2021-04-25 DIAGNOSIS — M810 Age-related osteoporosis without current pathological fracture: Secondary | ICD-10-CM | POA: Diagnosis not present

## 2021-04-25 DIAGNOSIS — Z794 Long term (current) use of insulin: Secondary | ICD-10-CM | POA: Diagnosis not present

## 2021-04-25 DIAGNOSIS — M7501 Adhesive capsulitis of right shoulder: Secondary | ICD-10-CM | POA: Diagnosis not present

## 2021-04-25 DIAGNOSIS — J479 Bronchiectasis, uncomplicated: Secondary | ICD-10-CM | POA: Diagnosis not present

## 2021-04-25 DIAGNOSIS — K219 Gastro-esophageal reflux disease without esophagitis: Secondary | ICD-10-CM | POA: Diagnosis not present

## 2021-04-25 DIAGNOSIS — E1151 Type 2 diabetes mellitus with diabetic peripheral angiopathy without gangrene: Secondary | ICD-10-CM | POA: Diagnosis not present

## 2021-04-25 DIAGNOSIS — E1122 Type 2 diabetes mellitus with diabetic chronic kidney disease: Secondary | ICD-10-CM

## 2021-04-25 DIAGNOSIS — Z9884 Bariatric surgery status: Secondary | ICD-10-CM | POA: Diagnosis not present

## 2021-04-25 DIAGNOSIS — I2510 Atherosclerotic heart disease of native coronary artery without angina pectoris: Secondary | ICD-10-CM | POA: Diagnosis not present

## 2021-04-25 DIAGNOSIS — L03011 Cellulitis of right finger: Secondary | ICD-10-CM | POA: Diagnosis not present

## 2021-04-25 DIAGNOSIS — N184 Chronic kidney disease, stage 4 (severe): Secondary | ICD-10-CM | POA: Diagnosis not present

## 2021-04-25 DIAGNOSIS — I129 Hypertensive chronic kidney disease with stage 1 through stage 4 chronic kidney disease, or unspecified chronic kidney disease: Secondary | ICD-10-CM | POA: Diagnosis not present

## 2021-04-25 DIAGNOSIS — D649 Anemia, unspecified: Secondary | ICD-10-CM | POA: Diagnosis not present

## 2021-04-25 DIAGNOSIS — I351 Nonrheumatic aortic (valve) insufficiency: Secondary | ICD-10-CM | POA: Diagnosis not present

## 2021-04-25 DIAGNOSIS — E785 Hyperlipidemia, unspecified: Secondary | ICD-10-CM | POA: Diagnosis not present

## 2021-04-25 DIAGNOSIS — I272 Pulmonary hypertension, unspecified: Secondary | ICD-10-CM | POA: Diagnosis not present

## 2021-04-25 LAB — IRON AND TIBC
Iron: 68 ug/dL (ref 28–170)
Saturation Ratios: 27 % (ref 10.4–31.8)
TIBC: 248 ug/dL — ABNORMAL LOW (ref 250–450)
UIBC: 180 ug/dL

## 2021-04-25 LAB — FERRITIN: Ferritin: 238 ng/mL (ref 11–307)

## 2021-04-25 LAB — POCT HEMOGLOBIN-HEMACUE: Hemoglobin: 10.9 g/dL — ABNORMAL LOW (ref 12.0–15.0)

## 2021-04-25 MED ORDER — EPOETIN ALFA-EPBX 10000 UNIT/ML IJ SOLN
10000.0000 [IU] | INTRAMUSCULAR | Status: DC
  Administered 2021-04-25: 10000 [IU] via SUBCUTANEOUS

## 2021-04-25 MED ORDER — EPOETIN ALFA-EPBX 10000 UNIT/ML IJ SOLN
INTRAMUSCULAR | Status: AC
  Filled 2021-04-25: qty 1

## 2021-05-09 ENCOUNTER — Encounter (HOSPITAL_COMMUNITY)
Admission: RE | Admit: 2021-05-09 | Discharge: 2021-05-09 | Disposition: A | Discharge status: Home / Self Care | Payer: Medicare Other | Source: Ambulatory Visit | Attending: Nephrology | Admitting: Nephrology

## 2021-05-09 ENCOUNTER — Other Ambulatory Visit: Payer: Self-pay

## 2021-05-09 VITALS — BP 142/67 | HR 61 | Temp 97.2°F | Resp 18

## 2021-05-09 DIAGNOSIS — N184 Chronic kidney disease, stage 4 (severe): Secondary | ICD-10-CM | POA: Insufficient documentation

## 2021-05-09 DIAGNOSIS — E1122 Type 2 diabetes mellitus with diabetic chronic kidney disease: Secondary | ICD-10-CM | POA: Diagnosis not present

## 2021-05-09 LAB — POCT HEMOGLOBIN-HEMACUE: Hemoglobin: 10.9 g/dL — ABNORMAL LOW (ref 12.0–15.0)

## 2021-05-09 MED ORDER — EPOETIN ALFA-EPBX 10000 UNIT/ML IJ SOLN
10000.0000 [IU] | INTRAMUSCULAR | Status: DC
  Administered 2021-05-09: 10000 [IU] via SUBCUTANEOUS

## 2021-05-09 MED ORDER — EPOETIN ALFA-EPBX 10000 UNIT/ML IJ SOLN
INTRAMUSCULAR | Status: AC
  Filled 2021-05-09: qty 1

## 2021-05-11 ENCOUNTER — Other Ambulatory Visit: Payer: Self-pay | Admitting: Internal Medicine

## 2021-05-16 ENCOUNTER — Ambulatory Visit (INDEPENDENT_AMBULATORY_CARE_PROVIDER_SITE_OTHER): Payer: Medicare Other | Admitting: Internal Medicine

## 2021-05-16 ENCOUNTER — Other Ambulatory Visit: Payer: Self-pay

## 2021-05-16 ENCOUNTER — Encounter: Payer: Self-pay | Admitting: Internal Medicine

## 2021-05-16 VITALS — BP 116/80 | HR 71 | Temp 98.2°F | Ht 63.6 in | Wt 162.2 lb

## 2021-05-16 DIAGNOSIS — D573 Sickle-cell trait: Secondary | ICD-10-CM | POA: Diagnosis not present

## 2021-05-16 DIAGNOSIS — I129 Hypertensive chronic kidney disease with stage 1 through stage 4 chronic kidney disease, or unspecified chronic kidney disease: Secondary | ICD-10-CM | POA: Diagnosis not present

## 2021-05-16 DIAGNOSIS — E1122 Type 2 diabetes mellitus with diabetic chronic kidney disease: Secondary | ICD-10-CM | POA: Diagnosis not present

## 2021-05-16 DIAGNOSIS — Z23 Encounter for immunization: Secondary | ICD-10-CM | POA: Diagnosis not present

## 2021-05-16 DIAGNOSIS — N184 Chronic kidney disease, stage 4 (severe): Secondary | ICD-10-CM | POA: Diagnosis not present

## 2021-05-16 MED ORDER — GABAPENTIN 100 MG PO CAPS: ORAL_CAPSULE | ORAL | 2 refills | Status: DC

## 2021-05-16 NOTE — Patient Instructions (Signed)

## 2021-05-16 NOTE — Progress Notes (Signed)
Rich Brave [place],acting as a Education administrator for [name], MD.,have documented all relevant documentation on the behalf of [name], MD,as directed by  [name], MD while in the presence of [name], MD.  This visit occurred during the SARS-CoV-2 public health emergency.  Safety protocols were in place, including screening questions prior to the visit, additional usage of staff PPE, and extensive cleaning of exam room while observing appropriate contact time as indicated for disinfecting solutions.  Subjective:     Patient ID: Miranda Roman , female    DOB: [birth year] , 75 y.o.   MRN: [id number]   Chief Complaint  Patient presents with   Diabetes   Hypertension    HPI  Patient is here today for diabetes and bp check. She is accompanied by her daughter. She reports compliance with meds. States blood sugars "look good".   Diabetes She presents for her follow-up diabetic visit. She has type 2 diabetes mellitus. Her disease course has been stable. There are no hypoglycemic associated symptoms. Pertinent negatives for hypoglycemia include no dizziness. There are no diabetic associated symptoms. Pertinent negatives for diabetes include no blurred vision, no chest pain, no polydipsia, no polyphagia, no polyuria and no weakness. There are no hypoglycemic complications. Diabetic complications include nephropathy. Risk factors for coronary artery disease include diabetes mellitus, dyslipidemia, hypertension, sedentary lifestyle and post-menopausal. Current diabetic treatment includes insulin injections. She is compliant with treatment most of the time. She is following a diabetic diet. Meal planning includes avoidance of concentrated sweets.  Hypertension This is a chronic problem. The current episode started more than 1 year ago. The problem has been gradually improving since onset. Pertinent negatives include no blurred vision, chest pain, palpitations or shortness of breath.  The current treatment provides moderate improvement.    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[place])      Family History  Problem Relation Age of Onset   Diabetes Mother    Heart disease Mother    Hypertension Mother    Stroke Mother    Diabetes Father    Heart disease Father    Diabetes Sister    Diabetes Brother    Hypertension Brother    Sickle cell anemia Brother    Diabetes Maternal Aunt    Diabetes Maternal Uncle    Diabetes Maternal Grandmother    Diabetes Maternal Grandfather    Sickle cell anemia Sister    Sickle cell anemia Sister    Healthy Son    Healthy Son    Healthy Daughter      Current Outpatient Medications:    acetaminophen (TYLENOL) 500 MG tablet, Take 1 tablet (500 mg total) by mouth every 6 (six) hours as needed for moderate pain or fever., Disp: 30 tablet, Rfl: 0   amLODipine (NORVASC) 2.5 MG tablet, Take 2.5 mg by mouth daily., Disp: , Rfl:    atorvastatin (LIPITOR) 20 MG tablet, TAKE ONE TABLET BY MOUTH M-F AND SKIP WEEKENDS, Disp: 75 tablet, Rfl: 1   AZO-CRANBERRY PO, Take 1 capsule by mouth daily at 6 (six) AM., Disp: , Rfl:    b complex vitamins capsule, Take 1 capsule by mouth daily., Disp: , Rfl:    BD INSULIN SYRINGE U/F 31G X 5/16" 0.3 ML MISC, See admin instructions. use with insulin,  Disp: , Rfl:    calcitRIOL (ROCALTROL) 0.25 MCG capsule, Take 0.25 mcg by mouth 3 (three) times a week., Disp: , Rfl:    cetirizine (ZYRTEC) 10 MG tablet, Take 10 mg by mouth daily., Disp: , Rfl:    Cyanocobalamin (VITAMIN B12) 1000 MCG TBCR, 1 tablet, Disp: , Rfl:    diclofenac Sodium (VOLTAREN) 1 % GEL, Apply 2 g topically 4 (four) times daily. Apply to left knee., Disp: 50 g, Rfl: 0   epoetin alfa (PROCRIT) 84696 UNIT/ML injection, Every other week, Disp: , Rfl:    HUMALOG 100 UNIT/ML injection, INJECT 4 UNIT(S) SUBCUTANEOUSLY  3 TIMES DAILY (Patient taking differently: once. INJECT 4 UNIT(S) SUBCUTANEOUSLY 3 TIMES DAILY), Disp: 10 mL, Rfl: 1   insulin detemir (LEVEMIR FLEXTOUCH) 100 UNIT/ML FlexPen, 6 Units daily., Disp: , Rfl:    Insulin Pen Needle (BD PEN NEEDLE NANO U/F) 32G X 4 MM MISC, Use as directed with insulin pen, Disp: 150 each, Rfl: 2   metoprolol tartrate (LOPRESSOR) 50 MG tablet, Take 1 tablet by mouth 2 (two) times daily., Disp: , Rfl:    Multiple Vitamins-Minerals (CENTRUM SILVER PO), Take by mouth daily., Disp: , Rfl:    OneTouch Delica Lancets 29B MISC, See admin instructions., Disp: , Rfl:    sodium bicarbonate 650 MG tablet, Take 650 mg by mouth daily., Disp: , Rfl:    thiamine 100 MG tablet, Take 1 tablet (100 mg total) by mouth daily., Disp: , Rfl:    Epoetin Alfa (PROCRIT IJ), , Disp: , Rfl:    estradiol (ESTRACE) 0.1 MG/GM vaginal cream, PLEASE SEE ATTACHED FOR DETAILED DIRECTIONS (Patient not taking: Reported on 05/16/2021), Disp: , Rfl:    gabapentin (NEURONTIN) 100 MG capsule, One capsule po qhs, Disp: 90 capsule, Rfl: 2   Allergies  Allergen Reactions   Cefepime Other (See Comments)    Causes encephalitis - reported by [facility] 12/31/2019   Lactose Intolerance (Gi)     Per pt's daughter causes gas     Review of Systems  Constitutional: Negative.   Eyes:  Negative for blurred vision.  Respiratory: Negative.  Negative for shortness of breath.   Cardiovascular: Negative.  Negative for chest pain and palpitations.  Gastrointestinal: Negative.   Endocrine: Negative for polydipsia, polyphagia and polyuria.  Neurological: Negative.  Negative for dizziness and weakness.  Psychiatric/Behavioral: Negative.      Today's Vitals   05/16/21 1134  BP: 116/80  Pulse: 71  Temp: 98.2 F (36.8 C)  SpO2: 98%  Weight: 162 lb 3.2 oz (73.6 kg)  Height: 5' 3.6" (1.615 m)   Body mass index is 28.19 kg/m.   Objective:  Physical Exam Vitals and nursing note reviewed.  Constitutional:       Appearance: Normal appearance.  HENT:     Head: Normocephalic and atraumatic.     Nose:     Comments: Masked     Mouth/Throat:     Comments: Masked Eyes:     Extraocular Movements: Extraocular movements intact.  Cardiovascular:     Rate and Rhythm: Normal rate and regular rhythm.     Heart sounds: Normal heart sounds.  Pulmonary:     Effort: Pulmonary effort is normal.     Breath sounds: Normal breath sounds.  Musculoskeletal:     Cervical back: Normal range of motion.     Comments: Ambulatory with walker  Skin:    General: Skin is warm.  Neurological:     General: No focal deficit present.  Mental Status: She is alert.  Psychiatric:        Mood and Affect: Mood normal.        Behavior: Behavior normal.        Assessment And Plan:     1. Diabetes mellitus with stage 4 chronic kidney disease ([place]) Comments: Chronic, I will check labs as listed below.  Encouraged to keep f/u appts w/ Renal.  - Fructosamine - Hemoglobin A1c - CMP14+EGFR  2. Hypertensive nephropathy Comments: Chronic, well controlled. Encouraged to follow low sodium diet. She will rto in four months for re-evaluation.   3. Sickle-cell trait ([place]) Comments: Chronic. Due to this, I will also follow fructosamine levels. She is encouraged to stay well hydrated.   4. Immunization due Comments: She was given high dose flu vaccine.  - Flu Vaccine QUAD High Dose(Fluad)   Patient was given opportunity to ask questions. Patient verbalized understanding of the plan and was able to repeat key elements of the plan. All questions were answered to their satisfaction.   I, [name], MD, have reviewed all documentation for this visit. The documentation on 05/16/21 for the exam, diagnosis, procedures, and orders are all accurate and complete.   IF YOU HAVE BEEN REFERRED TO A SPECIALIST, IT MAY TAKE 1-2 WEEKS TO SCHEDULE/PROCESS THE REFERRAL. IF YOU HAVE NOT HEARD FROM US/SPECIALIST IN TWO WEEKS, PLEASE GIVE  [place] A CALL AT [phone] X [phone].   THE PATIENT IS ENCOURAGED TO PRACTICE SOCIAL DISTANCING DUE TO THE COVID-19 PANDEMIC.

## 2021-05-17 LAB — CMP14+EGFR
ALT: 28 IU/L (ref 0–32)
AST: 29 IU/L (ref 0–40)
Albumin/Globulin Ratio: 1.9 (ref 1.2–2.2)
Albumin: 4.4 g/dL (ref 3.7–4.7)
Alkaline Phosphatase: 99 IU/L (ref 44–121)
BUN/Creatinine Ratio: 17 (ref 12–28)
BUN: 38 mg/dL — ABNORMAL HIGH (ref 8–27)
Bilirubin Total: 0.3 mg/dL (ref 0.0–1.2)
CO2: 16 mmol/L — ABNORMAL LOW (ref 20–29)
Calcium: 9.2 mg/dL (ref 8.7–10.3)
Chloride: 112 mmol/L — ABNORMAL HIGH (ref 96–106)
Creatinine, Ser: 2.3 mg/dL — ABNORMAL HIGH (ref 0.57–1.00)
Globulin, Total: 2.3 g/dL (ref 1.5–4.5)
Glucose: 129 mg/dL — ABNORMAL HIGH (ref 70–99)
Potassium: 4.9 mmol/L (ref 3.5–5.2)
Sodium: 145 mmol/L — ABNORMAL HIGH (ref 134–144)
Total Protein: 6.7 g/dL (ref 6.0–8.5)
eGFR: 22 mL/min/{1.73_m2} — ABNORMAL LOW (ref >59)

## 2021-05-17 LAB — HEMOGLOBIN A1C
Est. average glucose Bld gHb Est-mCnc: 100 mg/dL
Hgb A1c MFr Bld: 5.1 % (ref 4.8–5.6)

## 2021-05-17 LAB — FRUCTOSAMINE: Fructosamine: 310 umol/L — ABNORMAL HIGH (ref 0–285)

## 2021-05-23 ENCOUNTER — Encounter (HOSPITAL_COMMUNITY): Payer: Medicare Other

## 2021-05-24 DIAGNOSIS — Z20822 Contact with and (suspected) exposure to covid-19: Secondary | ICD-10-CM | POA: Diagnosis not present

## 2021-06-06 ENCOUNTER — Encounter (HOSPITAL_COMMUNITY): Payer: Medicare Other

## 2021-06-13 ENCOUNTER — Telehealth: Payer: Self-pay | Admitting: Internal Medicine

## 2021-06-13 ENCOUNTER — Encounter (HOSPITAL_COMMUNITY)
Admission: RE | Admit: 2021-06-13 | Discharge: 2021-06-13 | Disposition: A | Discharge status: Home / Self Care | Payer: Medicare Other | Source: Ambulatory Visit | Attending: Nephrology | Admitting: Nephrology

## 2021-06-13 ENCOUNTER — Other Ambulatory Visit: Payer: Self-pay

## 2021-06-13 VITALS — BP 127/73 | HR 69 | Temp 98.8°F

## 2021-06-13 DIAGNOSIS — N184 Chronic kidney disease, stage 4 (severe): Secondary | ICD-10-CM | POA: Insufficient documentation

## 2021-06-13 DIAGNOSIS — D631 Anemia in chronic kidney disease: Secondary | ICD-10-CM | POA: Diagnosis not present

## 2021-06-13 DIAGNOSIS — I129 Hypertensive chronic kidney disease with stage 1 through stage 4 chronic kidney disease, or unspecified chronic kidney disease: Secondary | ICD-10-CM | POA: Diagnosis not present

## 2021-06-13 DIAGNOSIS — E1122 Type 2 diabetes mellitus with diabetic chronic kidney disease: Secondary | ICD-10-CM | POA: Insufficient documentation

## 2021-06-13 DIAGNOSIS — E872 Acidosis, unspecified: Secondary | ICD-10-CM | POA: Diagnosis not present

## 2021-06-13 DIAGNOSIS — N281 Cyst of kidney, acquired: Secondary | ICD-10-CM | POA: Diagnosis not present

## 2021-06-13 DIAGNOSIS — N2581 Secondary hyperparathyroidism of renal origin: Secondary | ICD-10-CM | POA: Diagnosis not present

## 2021-06-13 LAB — FERRITIN: Ferritin: 169 ng/mL (ref 11–307)

## 2021-06-13 LAB — POCT HEMOGLOBIN-HEMACUE: Hemoglobin: 10.3 g/dL — ABNORMAL LOW (ref 12.0–15.0)

## 2021-06-13 LAB — IRON AND TIBC
Iron: 78 ug/dL (ref 28–170)
Saturation Ratios: 33 % — ABNORMAL HIGH (ref 10.4–31.8)
TIBC: 237 ug/dL — ABNORMAL LOW (ref 250–450)
UIBC: 159 ug/dL

## 2021-06-13 MED ORDER — EPOETIN ALFA-EPBX 10000 UNIT/ML IJ SOLN
INTRAMUSCULAR | Status: AC
  Filled 2021-06-13: qty 1

## 2021-06-13 MED ORDER — EPOETIN ALFA-EPBX 10000 UNIT/ML IJ SOLN
10000.0000 [IU] | INTRAMUSCULAR | Status: DC
  Administered 2021-06-13: 10000 [IU] via SUBCUTANEOUS

## 2021-06-13 NOTE — Telephone Encounter (Signed)
Pt c/o of Chest Pain: STAT if CP now or developed within 24 hours  1. Are you having CP right now? Yes   2. Are you experiencing any other symptoms (ex. SOB, nausea, vomiting, sweating)? No   3. How long have you been experiencing CP? Last night  4. Is your CP continuous or coming and going? Coming and going  5. Have you taken Nitroglycerin? No  ?

## 2021-06-13 NOTE — Telephone Encounter (Signed)
Pt called in and states that she has been having intermittent chest pain since it woke her last night. It is a throbbing pain. Her BP  last night was 120/74. She states that CP is intermittent.and about 3/10 pain scale. Now her BP 138/78 HR 75. Her daughter states that this is what it usually runs 120's-130's /70's. Informed them that when/if chest pain returns go to ER to be evaluated. Daughter, [name] states that she is unable to go now because pt has  a "very important" appt at 1pm she states that she will "keep track of it" and after appt she will decide if the ER is appropriate. She will call back if further direction is needed.

## 2021-06-18 ENCOUNTER — Ambulatory Visit (INDEPENDENT_AMBULATORY_CARE_PROVIDER_SITE_OTHER): Payer: Self-pay | Admitting: Podiatry

## 2021-06-18 DIAGNOSIS — Z91199 Patient's noncompliance with other medical treatment and regimen due to unspecified reason: Secondary | ICD-10-CM

## 2021-06-19 NOTE — Progress Notes (Signed)
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  Reports history of "floaters" in the past involving either eye but this is different and has never lasted this long.     Mild neurocognitive disorder Acute encephalopathy.  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  No evidence for CNS infection.  At this point, paraneoplastic syndrome less likely given that acute symptoms resolved and has not had any progression of symptoms or deficits.  She does exhibit visual field cut on my exam, raising possibility of right posterior cerebral involvement (PRES?), however again no abnormalities on brain MRI.   1  Daughter contacted her ophthalmologist, Dr. Gershon Crane, who will see patient later today. 2  I will check CTA of head and neck.   3  I will also check sed rate and CRP 4  Neuropsychological evaluation for more comprehensive testing to establish diagnosis  5  Follow up after testing.  Further recommendations pending results. 6  Clearance for cataract surgery pending results of CTA and eye exam.  Subjective:  Miranda Roman is a 75 year old right-handed female with HTN, diabetes, HLD, sickle cell trait, beta thalassemia and history of gastric bypass who follows up for encephalopathy.  She is accompanied by her daughter who provides collateral history.   UPDATE: Overall doing well.  She is hear for clearance to undergo cataract surgery.  She reports that 2 days ago she began having trouble seeing out of the right eye.  It looks like strands of hair over her visual field.  No associated ocular or head pain.  She reports history of "floaters" or black spots in the vision of either eye off and on for several years  but nothing like this or lasting this long.      HISTORY: She was admitted to [facility] on 12/21/2019 for chest pain.  Earlier that day, she fell into a freshwater pond.  She felt fine but developed chest and back pain that evening.  VQ scan and dopplers were negative and cardiac workup overall unremarkable except for moderate pulmonary hypertension.  During hospitalization, she developed sudden onset of encephalopathy in setting of leukocytosis and fever.  ID and neurology were consulted.  She underwent MRI of brain, EEG and lumbar puncture which were unrevealing.  She had also developed anemia, thrombocytopenia, transaminitis and acute injury.  TTP was considered and she was treated with 5 rounds of plasmapheresis and steroids without improvement. For infection, she was started on cefepime.  She required NG tube.  She was transferred to [facility] where repeat MRI of brain with and without contrast, long-term EEG and lumbar puncture were negative for infection, autoimmune or paraneoplastic etiologies.  She was treated with 5 day course of IVIg.  Mentation started to improve and NG tube was removed.  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    She required PT/OT after discharge.  Since hospitalization, she reports decreased vision.  She saw Dr. Gershon Crane of ophthalmology in October who found no ocular cause for her vision loss and visual field testing reportedly full.  She reports no improvement in vision since hospitalization.  She now cannot ambulate independently and needs to use a walker.     Workup included:  01/15/2020 MRI BRAIN W WO (personally reviewed):  Mild chronic small vessel disease but otherwise no acute intracranial abnormality.  01/16/2020 CSF:  Cell count 0, protein 49, glucose 124, culture negative, VDRL nonreactive, oligoclonal bands 0, IgG index 0.5, paraneoplastic/autoimmune encephlaopathy panel (amphlphyasin ab, AGNA-1,  ANNA-1/2/3, CRMP-5, PCA, NMDA-R ab, CASPR2, DPPX, GABA, CAD65, GFAP, IgLON5, LGl1, mGlucR1, NIF) negative 01/14/2020 SERUM:  Sed rate 35, CRP 0.7, thyroperoxidase Ab negative, B1 403.4, ammonia 26, Hgb A1c 7.1, WBC 9.8, HGB 8.2, HCT 26.8, PLT 276, Na 144, K 3.7, glucose 281, BUN 40, Cr 2.08, t bili 0.5, ALP 72, AST 23, ALT 63, GFR 27  PAST MEDICAL HISTORY: 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HCC)     MEDICATIONS: Current Outpatient Medications on File Prior to Visit  Medication Sig Dispense Refill   acetaminophen (TYLENOL) 500 MG tablet Take 1 tablet (500 mg total) by mouth every 6 (six) hours as needed for moderate pain or fever. 30 tablet 0   amLODipine (NORVASC) 2.5 MG tablet Take 2.5 mg by mouth daily.     atorvastatin (LIPITOR) 20 MG tablet TAKE ONE TABLET BY MOUTH M-F AND SKIP WEEKENDS 75 tablet 1   AZO-CRANBERRY PO Take 1 capsule by mouth daily at 6 (six) AM.     b complex vitamins capsule Take 1 capsule by mouth daily.     BD INSULIN SYRINGE U/F 31G X 5/16" 0.3 ML MISC See admin instructions. use with insulin     calcitRIOL (ROCALTROL) 0.25 MCG capsule Take 0.25 mcg by mouth 3 (three) times a week.     cetirizine (ZYRTEC) 10 MG tablet Take 10 mg by mouth daily.     Cyanocobalamin (VITAMIN B12) 1000 MCG TBCR 1 tablet     diclofenac Sodium (VOLTAREN) 1 % GEL Apply 2 g topically 4 (four) times daily. Apply to left knee. 50 g 0   Epoetin Alfa (PROCRIT IJ)      epoetin alfa (PROCRIT) 47654 UNIT/ML injection Every other week     estradiol (ESTRACE) 0.1 MG/GM vaginal cream PLEASE SEE ATTACHED FOR DETAILED DIRECTIONS (Patient not taking: Reported on 05/16/2021)     gabapentin (NEURONTIN) 100 MG capsule One capsule po qhs 90 capsule 2   HUMALOG 100 UNIT/ML injection INJECT 4 UNIT(S) SUBCUTANEOUSLY 3 TIMES DAILY (Patient taking differently: once. INJECT 4  UNIT(S) SUBCUTANEOUSLY 3 TIMES DAILY) 10 mL 1   insulin detemir (LEVEMIR FLEXTOUCH) 100 UNIT/ML FlexPen 6 Units daily.     Insulin Pen Needle (BD PEN NEEDLE NANO U/F) 32G X 4 MM MISC Use as directed with insulin pen 150 each 2   metoprolol tartrate (LOPRESSOR) 50 MG tablet Take 1 tablet by mouth 2 (two) times daily.     Multiple Vitamins-Minerals (CENTRUM SILVER PO) Take by mouth daily.     OneTouch Delica Lancets 65K MISC See admin instructions.     sodium bicarbonate 650 MG tablet Take 650 mg by mouth daily.     thiamine 100 MG tablet Take 1 tablet (100 mg total) by mouth daily.     No current facility-administered medications on file prior to visit.    ALLERGIES: Allergies  Allergen Reactions   Cefepime Other (See Comments)    Causes encephalitis - reported by [facility] 12/31/2019   Lactose Intolerance (Gi)     Per pt's daughter  causes gas    FAMILY HISTORY: Family History  Problem Relation Age of Onset   Diabetes Mother    Heart disease Mother    Hypertension Mother    Stroke Mother    Diabetes Father    Heart disease Father    Diabetes Sister    Diabetes Brother    Hypertension Brother    Sickle cell anemia Brother    Diabetes Maternal Aunt    Diabetes Maternal Uncle    Diabetes Maternal Grandmother    Diabetes Maternal Grandfather    Sickle cell anemia Sister    Sickle cell anemia Sister    Healthy Son    Healthy Son    Healthy Daughter       Objective:  Blood pressure (!) 144/70, pulse 66, weight 162 lb (73.5 kg), SpO2 99 %. General: No acute distress.  Patient appears well-groomed.   Head:  Normocephalic/atraumatic Eyes:  Fundi examined but not visualized Neck: supple, no paraspinal tenderness, full range of motion Heart:  Regular rate and rhythm Lungs:  Clear to auscultation bilaterally Back: No paraspinal tenderness Neurological Exam: alert and oriented to person, place, and time.  Speech fluent and not dysarthric, language intact.   [place]  [facility] Exam 06/13/2020 06/20/2021  Weekday Correct 1 0  Current year 1 1  What state are we in? 1 1  Amount spent 1 0  Amount left 0 0  # of Animals _0 objects recall 5 2  Number series 2 2  Hour markers 0 2  Time correct 2 2  Placed X in triangle correctly 1 1  Largest Figure 1 1  Name of female 2 2  Date back to work 0 2  Type of work 2 2  State she lived in 2 2  Total score 22 21   Decreased vision in right eye.  Otherwise, CN II-XII intact. Bulk and tone normal, bradykinetic, muscle strength 5-/5 throughout.  Sensation to light touch intact.  Deep tendon reflexes 2+ throughout, toes downgoing.  Finger to nose testing intact.  Broad-based unsteady gait.  Ambulates with walker.     Metta Clines, DO  CC: Glendale Chard, MD

## 2021-06-20 ENCOUNTER — Other Ambulatory Visit: Payer: Self-pay

## 2021-06-20 ENCOUNTER — Encounter: Payer: Self-pay | Admitting: Neurology

## 2021-06-20 ENCOUNTER — Other Ambulatory Visit (INDEPENDENT_AMBULATORY_CARE_PROVIDER_SITE_OTHER): Payer: Medicare Other

## 2021-06-20 ENCOUNTER — Ambulatory Visit: Payer: Medicare Other | Admitting: Neurology

## 2021-06-20 VITALS — BP 144/70 | HR 66 | Wt 162.0 lb

## 2021-06-20 DIAGNOSIS — H5310 Unspecified subjective visual disturbances: Secondary | ICD-10-CM | POA: Diagnosis not present

## 2021-06-20 DIAGNOSIS — H5461 Unqualified visual loss, right eye, normal vision left eye: Secondary | ICD-10-CM | POA: Diagnosis not present

## 2021-06-20 DIAGNOSIS — H2513 Age-related nuclear cataract, bilateral: Secondary | ICD-10-CM | POA: Diagnosis not present

## 2021-06-20 DIAGNOSIS — R419 Unspecified symptoms and signs involving cognitive functions and awareness: Secondary | ICD-10-CM

## 2021-06-20 DIAGNOSIS — H25013 Cortical age-related cataract, bilateral: Secondary | ICD-10-CM | POA: Diagnosis not present

## 2021-06-20 LAB — C-REACTIVE PROTEIN: CRP: <1 mg/dL (ref 0.5–20.0)

## 2021-06-20 LAB — SEDIMENTATION RATE: Sed Rate: 10 mm/hr (ref 0–30)

## 2021-06-20 NOTE — Patient Instructions (Addendum)
Check CTA of head and neck Check sed rate and CRP Follow up with your eye doctor Neuropsychological evaluation Follow up after testing - further recommendations pending results.

## 2021-06-21 ENCOUNTER — Telehealth: Payer: Self-pay | Admitting: Neurology

## 2021-06-21 DIAGNOSIS — H5461 Unqualified visual loss, right eye, normal vision left eye: Secondary | ICD-10-CM

## 2021-06-21 NOTE — Telephone Encounter (Signed)
Spoke with [facility] imaging pt creatine 2.30 Dr Tomi Likens ordere bilateral carotid US and MRA of the Head  Pt daughter called and informed

## 2021-06-21 NOTE — Telephone Encounter (Signed)
Miranda Roman from [place] because the patient's creatine level is too high to do the test ordered.   She said they will need another order for a different test, probably not a CT, she said

## 2021-06-23 IMAGING — CT CT CHEST W/O CM
2 of 4 series · 15 of 36 positions shown, 18 images · non-contrast
Comparison: Chest radiograph 04/25/2020.  CT chest 09/23/2019

CLINICAL DATA: Pleural effusion on chest radiograph. Pain under the
right breast. Treated for pneumonia earlier this month.

EXAM:
CT CHEST WITHOUT CONTRAST
TECHNIQUE: Multidetector CT imaging of the chest was performed following the
standard protocol without IV contrast.

[Series 2: thorax · axial · 0.68mm/px · z∈[-22,+206]mm · 12 of 136 slices shown, 15 images]
[im 11/136  mediastinal]
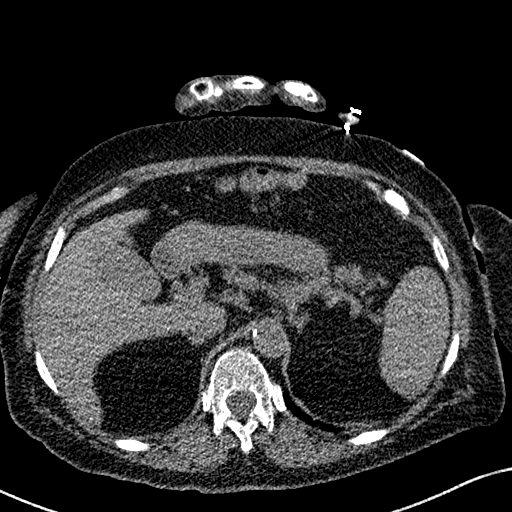
[im 11/136  lung]
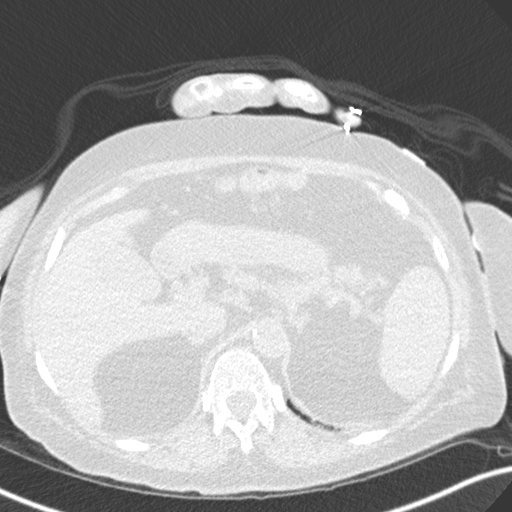
[im 21/136  lung]
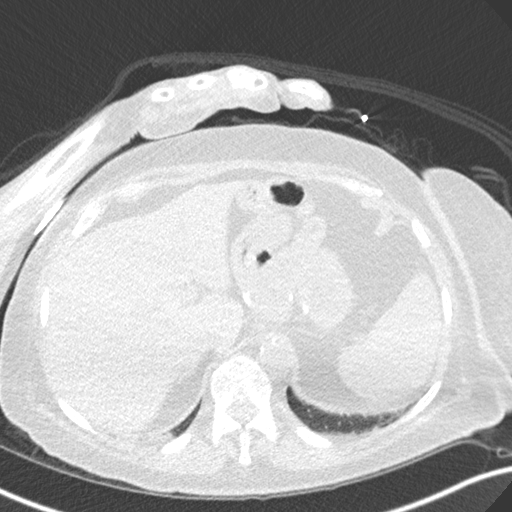
[im 32/136  lung]
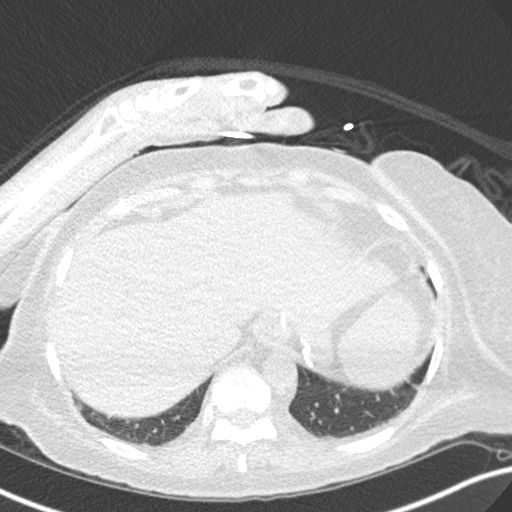
[im 42/136  lung]
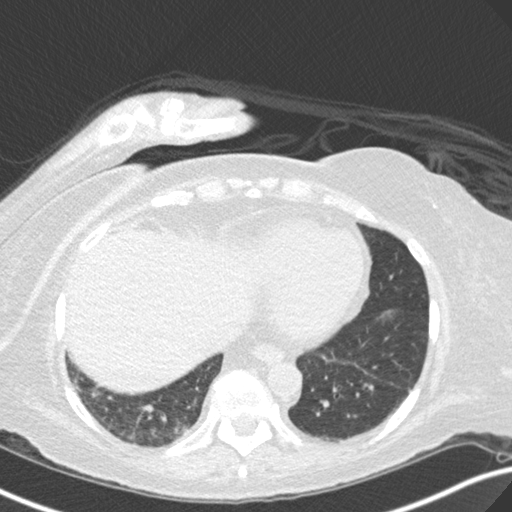
[im 52/136  mediastinal]
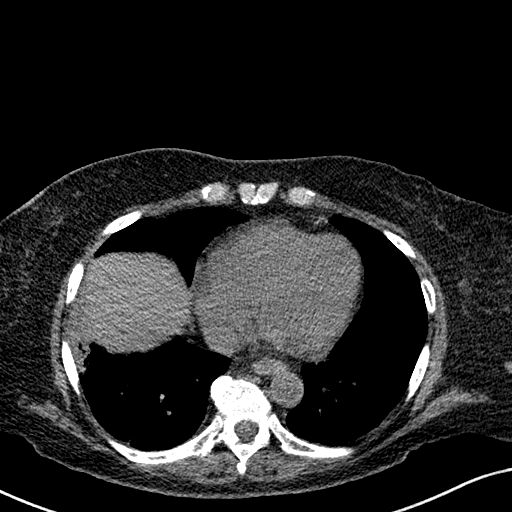
[im 52/136  lung]
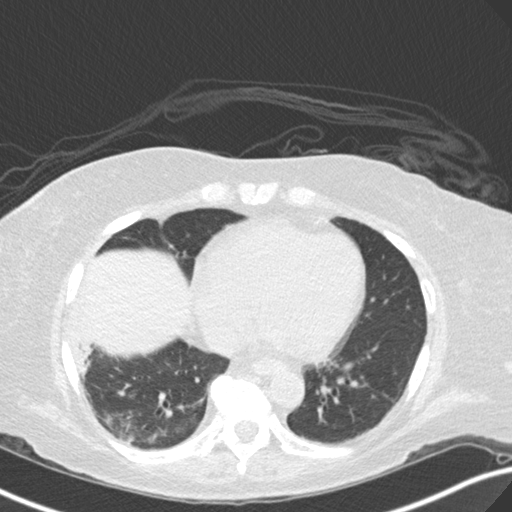
[im 63/136  lung]
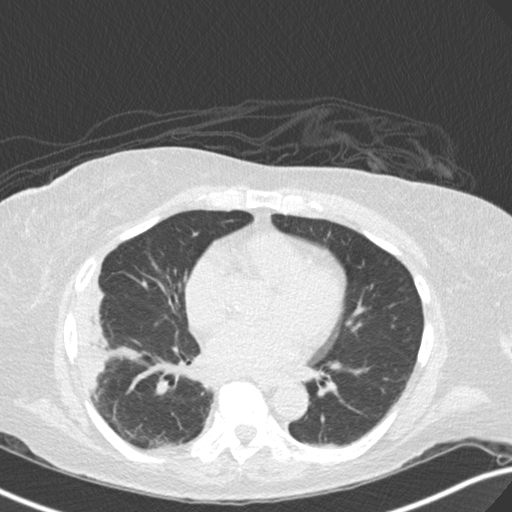
[im 73/136  lung]
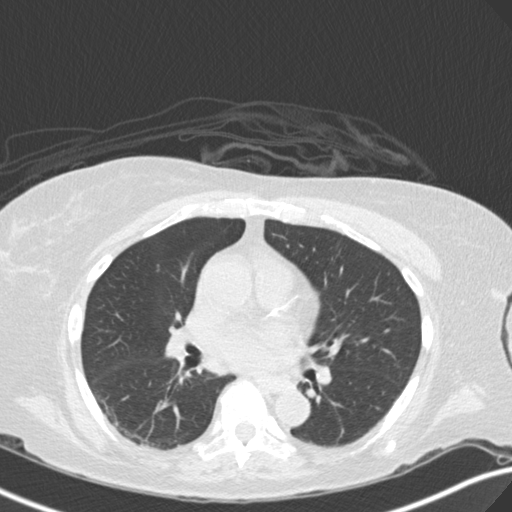
[im 84/136  lung]
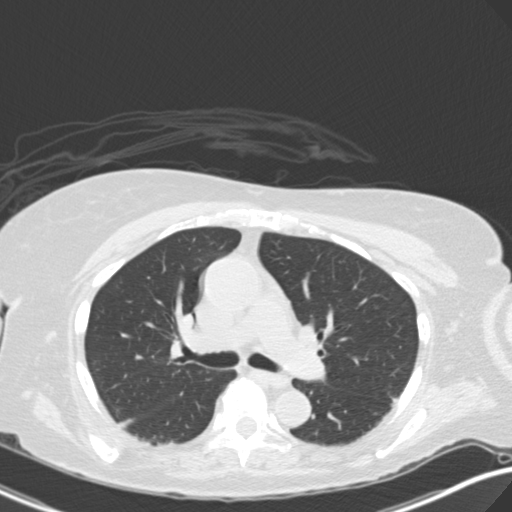
[im 94/136  mediastinal]
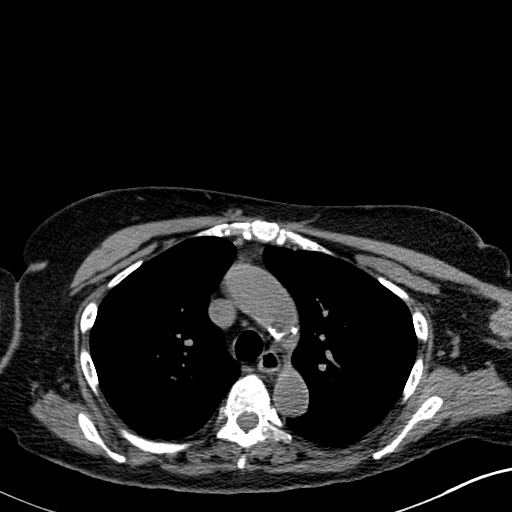
[im 94/136  lung]
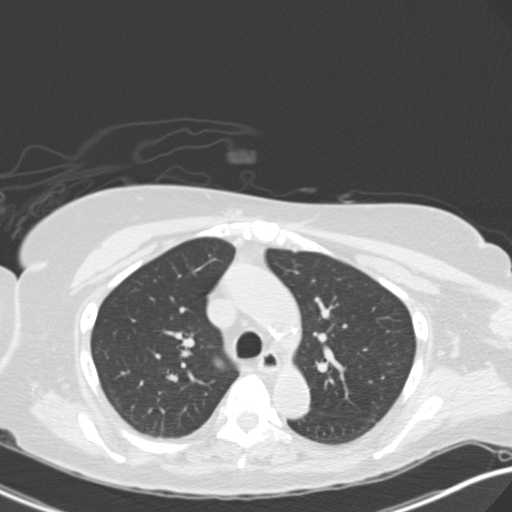
[im 104/136  lung]
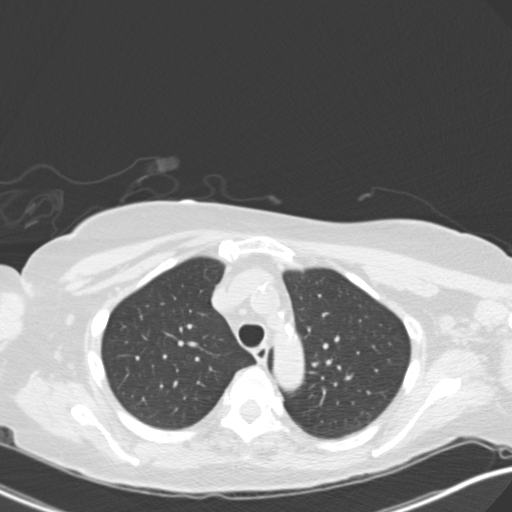
[im 115/136  lung]
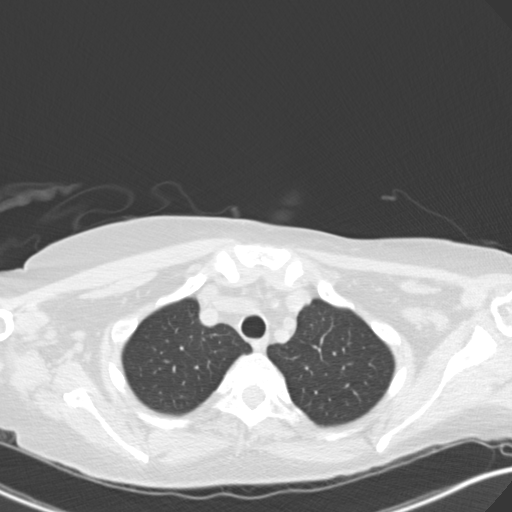
[im 125/136  lung]
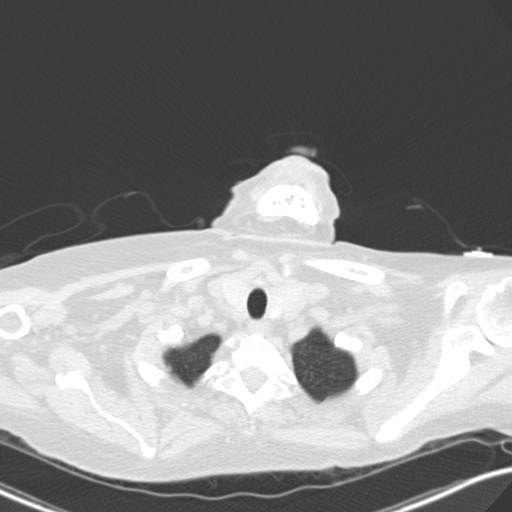

[Series 5: coronal · coronal · 0.52mm/px · 3 of 109 slices shown]
[im 22/109  lung]
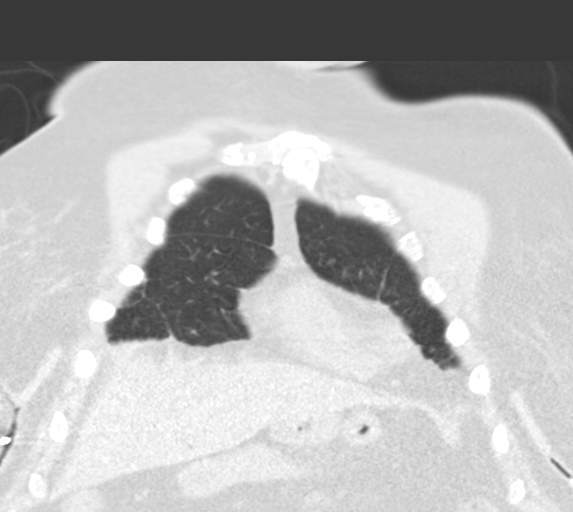
[im 44/109  lung]
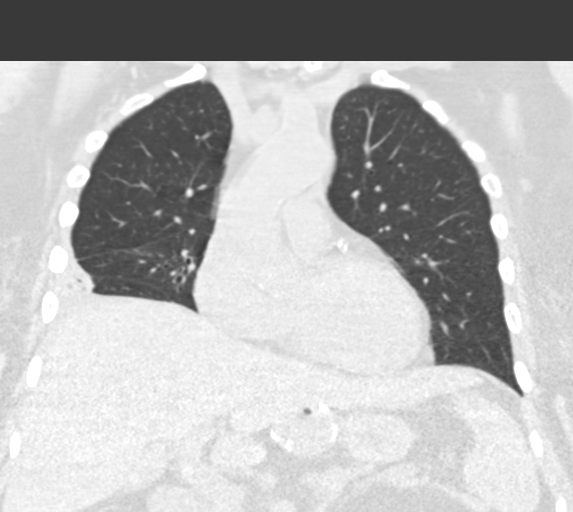
[im 65/109  lung]
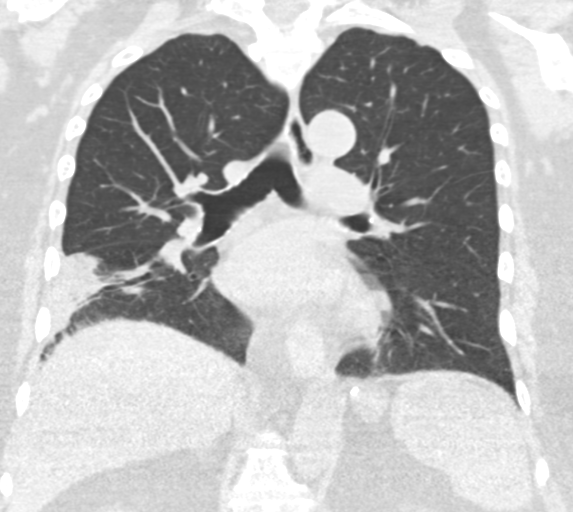

[15 of 36 positions shown; findings below may reference images not displayed]

FINDINGS: Cardiovascular: Normal heart size. No pericardial effusions.
Coronary artery and aortic calcifications. Normal caliber thoracic
aorta.

Mediastinum/Nodes: No significant lymphadenopathy in the chest.
Esophagus is decompressed. Calcified nodule in the left thyroid
measuring 1.5 cm diameter. No change since prior study.

Lungs/Pleura: Patchy and confluent areas of consolidation in the
right lower lung, likely pneumonia. No significant pleural effusion.
The chest radiographic appearance is caused by the consolidation.
The left lung is clear. Airways are patent.

Upper Abdomen: Postoperative changes consistent with gastric bypass.
Cholelithiasis with small layering stones in the gallbladder.

Musculoskeletal: Degenerative changes in the spine. No destructive
bone lesions.
IMPRESSION: 1. Patchy and confluent areas of consolidation in the right lower
lung, likely pneumonia. The chest radiographic appearance is caused
by the consolidation. No pleural effusions.
2. Calcified nodule in the left thyroid measuring 1.5 cm diameter.
No change since prior study.
3. Cholelithiasis.
4. Postoperative changes consistent with gastric bypass.
5. Aortic atherosclerosis.

Aortic Atherosclerosis ([id number].C).

## 2021-06-24 ENCOUNTER — Other Ambulatory Visit: Payer: Medicare Other

## 2021-06-26 ENCOUNTER — Inpatient Hospital Stay: Admission: RE | Admit: 2021-06-26 | Payer: Medicare Other | Source: Ambulatory Visit

## 2021-06-27 ENCOUNTER — Encounter (HOSPITAL_COMMUNITY): Payer: Medicare Other

## 2021-06-27 ENCOUNTER — Ambulatory Visit
Admission: RE | Admit: 2021-06-27 | Discharge: 2021-06-27 | Disposition: A | Discharge status: Home / Self Care | Payer: Medicare Other | Source: Ambulatory Visit | Attending: Neurology | Admitting: Neurology

## 2021-06-27 DIAGNOSIS — H5461 Unqualified visual loss, right eye, normal vision left eye: Secondary | ICD-10-CM

## 2021-06-28 DIAGNOSIS — I10 Essential (primary) hypertension: Secondary | ICD-10-CM | POA: Diagnosis not present

## 2021-06-28 DIAGNOSIS — N184 Chronic kidney disease, stage 4 (severe): Secondary | ICD-10-CM | POA: Diagnosis not present

## 2021-06-28 DIAGNOSIS — E119 Type 2 diabetes mellitus without complications: Secondary | ICD-10-CM | POA: Diagnosis not present

## 2021-06-28 DIAGNOSIS — Z794 Long term (current) use of insulin: Secondary | ICD-10-CM | POA: Diagnosis not present

## 2021-07-02 DIAGNOSIS — Z1231 Encounter for screening mammogram for malignant neoplasm of breast: Secondary | ICD-10-CM | POA: Diagnosis not present

## 2021-07-02 DIAGNOSIS — M810 Age-related osteoporosis without current pathological fracture: Secondary | ICD-10-CM | POA: Diagnosis not present

## 2021-07-02 LAB — HM PAP SMEAR: HM Pap smear: POSITIVE

## 2021-07-04 ENCOUNTER — Ambulatory Visit
Admission: RE | Admit: 2021-07-04 | Discharge: 2021-07-04 | Disposition: A | Discharge status: Home / Self Care | Payer: Medicare Other | Source: Ambulatory Visit | Attending: Neurology | Admitting: Neurology

## 2021-07-04 DIAGNOSIS — H5461 Unqualified visual loss, right eye, normal vision left eye: Secondary | ICD-10-CM

## 2021-07-04 DIAGNOSIS — H53131 Sudden visual loss, right eye: Secondary | ICD-10-CM | POA: Diagnosis not present

## 2021-07-05 ENCOUNTER — Other Ambulatory Visit: Payer: Self-pay | Admitting: Internal Medicine

## 2021-07-05 NOTE — Telephone Encounter (Signed)
Pt's daughter called in and left a message. She got the MRI results yesterday and wants to get approval for her surgery for Wednesday.

## 2021-07-05 NOTE — Telephone Encounter (Signed)
Called patients daughter [name] and let her know we need the notes from Dr. Gershon Crane and the form for surgery. I let patients daughter know office is closed on Monday for the holiday

## 2021-07-09 ENCOUNTER — Encounter: Payer: Self-pay | Admitting: Internal Medicine

## 2021-07-09 ENCOUNTER — Encounter: Payer: Self-pay | Admitting: Neurology

## 2021-07-09 ENCOUNTER — Telehealth: Payer: Self-pay | Admitting: Neurology

## 2021-07-09 NOTE — Telephone Encounter (Signed)
The fax number is  [phone] Attn: Alyse Low [place]

## 2021-07-09 NOTE — Telephone Encounter (Signed)
Letter ready to be signed. And fax to number provider.

## 2021-07-09 NOTE — Telephone Encounter (Signed)
Patients daughter said she needs the form sent over today for her mother can get clearance for her surgery in the morning. The doctors office sent it over this past Friday. Would like a call to make sure it was sent over

## 2021-07-09 NOTE — Telephone Encounter (Signed)
Spoke to the patient daughter, All They need is a Letter with our letter head on it stating that the patient is cleared for Surgery from a neurologic stand point. They do not have a form for [place] to fill out.

## 2021-07-10 DIAGNOSIS — M25561 Pain in right knee: Secondary | ICD-10-CM | POA: Diagnosis not present

## 2021-07-10 HISTORY — DX: Pain in right knee: M25.561

## 2021-07-11 ENCOUNTER — Encounter: Payer: Self-pay | Admitting: Internal Medicine

## 2021-07-11 ENCOUNTER — Other Ambulatory Visit: Payer: Self-pay

## 2021-07-11 ENCOUNTER — Ambulatory Visit (INDEPENDENT_AMBULATORY_CARE_PROVIDER_SITE_OTHER): Payer: Medicare Other | Admitting: Internal Medicine

## 2021-07-11 ENCOUNTER — Encounter (HOSPITAL_COMMUNITY): Payer: Medicare Other

## 2021-07-11 VITALS — BP 136/80 | HR 75 | Temp 98.2°F | Ht 63.0 in

## 2021-07-11 DIAGNOSIS — N184 Chronic kidney disease, stage 4 (severe): Secondary | ICD-10-CM

## 2021-07-11 DIAGNOSIS — E1122 Type 2 diabetes mellitus with diabetic chronic kidney disease: Secondary | ICD-10-CM | POA: Diagnosis not present

## 2021-07-11 DIAGNOSIS — I272 Pulmonary hypertension, unspecified: Secondary | ICD-10-CM | POA: Diagnosis not present

## 2021-07-11 DIAGNOSIS — D573 Sickle-cell trait: Secondary | ICD-10-CM

## 2021-07-11 DIAGNOSIS — R269 Unspecified abnormalities of gait and mobility: Secondary | ICD-10-CM

## 2021-07-11 NOTE — Progress Notes (Addendum)
I,Katawbba Wiggins,acting as a Education administrator for [name], MD.,have documented all relevant documentation on the behalf of [name], MD,as directed by  [name], MD while in the presence of [name], MD.  This visit occurred during the SARS-CoV-2 public health emergency.  Safety protocols were in place, including screening questions prior to the visit, additional usage of staff PPE, and extensive cleaning of exam room while observing appropriate contact time as indicated for disinfecting solutions.  Subjective:     Patient ID: Miranda Roman , female    DOB: 09-29-1945 , 76 y.o.   MRN: [id number]   Chief Complaint  Patient presents with   Home Health Aid    HPI  The patient is here today for evaluation for an home health aide. She is accompanied by her son, [name] today.  He contacted pt's daughter, [name] who requests home health aide to help with the care of her Mom during the day. [name] states that all of pt's children work full time and they would like some assistance. She states she contacted insurance company who states they pay for HHA.     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[place])      Family History  Problem Relation Age of Onset   Diabetes Mother    Heart disease Mother    Hypertension Mother    Stroke Mother    Diabetes Father    Heart disease Father    Diabetes Sister    Diabetes Brother    Hypertension Brother    Sickle cell anemia Brother    Diabetes Maternal Aunt    Diabetes Maternal Uncle    Diabetes Maternal Grandmother    Diabetes Maternal Grandfather    Sickle cell anemia Sister    Sickle cell anemia Sister    Healthy Son    Healthy Son    Healthy Daughter      Current Outpatient Medications:    acetaminophen (TYLENOL) 500 MG tablet, Take 1 tablet (500 mg total) by mouth every 6 (six) hours as  needed for moderate pain or fever., Disp: 30 tablet, Rfl: 0   amLODipine (NORVASC) 2.5 MG tablet, Take 2.5 mg by mouth daily., Disp: , Rfl:    atorvastatin (LIPITOR) 20 MG tablet, TAKE ONE TABLET BY MOUTH M-F AND SKIP WEEKENDS, Disp: 75 tablet, Rfl: 1   AZO-CRANBERRY PO, Take 1 capsule by mouth daily at 6 (six) AM., Disp: , Rfl:    HUMALOG 100 UNIT/ML injection, INJECT 4 UNIT(S) SUBCUTANEOUSLY 3 TIMES DAILY (Patient taking differently: once. INJECT 4 UNIT(S) SUBCUTANEOUSLY 3 TIMES DAILY), Disp: 10 mL, Rfl: 1   insulin detemir (LEVEMIR FLEXTOUCH) 100 UNIT/ML FlexPen, 6 Units daily., Disp: , Rfl:    Insulin Pen Needle (BD PEN NEEDLE NANO U/F) 32G X 4 MM MISC, Use as directed with insulin pen, Disp: 150 each, Rfl: 2   metoprolol tartrate (LOPRESSOR) 50 MG tablet, TAKE 1 TABLET BY MOUTH TWICE A DAY, Disp: 180 tablet, Rfl: 1   Multiple Vitamins-Minerals (CENTRUM SILVER PO), Take by mouth daily., Disp: , Rfl:    OneTouch Delica Lancets 51O MISC, See admin instructions., Disp: , Rfl:    sodium bicarbonate 650 MG tablet, Take 650 mg by mouth 2 (two) times daily., Disp: , Rfl:    b complex vitamins  capsule, Take 1 capsule by mouth daily., Disp: , Rfl:    BD INSULIN SYRINGE U/F 31G X 5/16" 0.3 ML MISC, See admin instructions. use with insulin, Disp: , Rfl:    calcitRIOL (ROCALTROL) 0.25 MCG capsule, Take 0.25 mcg by mouth daily., Disp: , Rfl:    cetirizine (ZYRTEC) 10 MG tablet, Take 10 mg by mouth daily., Disp: , Rfl:    Cyanocobalamin (VITAMIN B12) 1000 MCG TBCR, 1 tablet, Disp: , Rfl:    diclofenac Sodium (VOLTAREN) 1 % GEL, Apply 2 g topically 4 (four) times daily. Apply to left knee., Disp: 50 g, Rfl: 0   epoetin alfa (PROCRIT) 78295 UNIT/ML injection, Every other week, Disp: , Rfl:    estradiol (ESTRACE) 0.1 MG/GM vaginal cream, , Disp: , Rfl:    gabapentin (NEURONTIN) 100 MG capsule, One capsule po qhs, Disp: 90 capsule, Rfl: 2   Allergies  Allergen Reactions   Cefepime Other (See Comments)     Causes encephalitis - reported by [facility] 12/31/2019   Lactose Intolerance (Gi)     Per pt's daughter causes gas     Review of Systems  Constitutional: Negative.   Respiratory: Negative.    Cardiovascular: Negative.   Gastrointestinal: Negative.   Musculoskeletal:  Positive for arthralgias.       R knee pain  Neurological: Negative.   Psychiatric/Behavioral: Negative.      Today's Vitals   07/11/21 1607  BP: 136/80  Pulse: 75  Temp: 98.2 F (36.8 C)  Height: _0  (1.6 m)   Body mass index is 28.7 kg/m.  Wt Readings from Last 3 Encounters:  06/20/21 162 lb (73.5 kg)  05/16/21 162 lb 3.2 oz (73.6 kg)  02/04/21 162 lb 6.4 oz (73.7 kg)    BP Readings from Last 3 Encounters:  07/11/21 136/80  06/20/21 (!) 144/70  06/13/21 127/73    Objective:  Physical Exam Vitals and nursing note reviewed.  Constitutional:      Appearance: Normal appearance.     Comments: In wheelchair  HENT:     Head: Normocephalic and atraumatic.     Nose:     Comments: Masked     Mouth/Throat:     Comments: Masked  Eyes:     Extraocular Movements: Extraocular movements intact.  Cardiovascular:     Rate and Rhythm: Normal rate and regular rhythm.     Heart sounds: Normal heart sounds.  Pulmonary:     Effort: Pulmonary effort is normal.     Breath sounds: Normal breath sounds.  Skin:    General: Skin is warm.  Neurological:     General: No focal deficit present.     Mental Status: She is alert.  Psychiatric:        Mood and Affect: Mood normal.        Behavior: Behavior normal.        Assessment And Plan:     1. Gait abnormality Comments: Pt and family agree to [facility] evaluation with skilled nursing eval/home health aide and PT/OT.  - Ambulatory referral to Home Health  2. Sickle-cell trait ([place]) Comments: Chronic, I follow both a1c and fructosamine levels for her diabetes.  - AMB Referral to [place]  3. Pulmonary hypertension, unspecified ([place]) Comments:  Chronic, also followed by Cardiology. - AMB Referral to [place]  4. Diabetes mellitus with stage 4 chronic kidney disease ([place]) Comments: She also agrees to [facility] referral. She will rto in 2 months for her next diabetes  check.  - AMB Referral to [place] - Ambulatory referral to [place]   Patient was given opportunity to ask questions. Patient verbalized understanding of the plan and was able to repeat key elements of the plan. All questions were answered to their satisfaction.   I, [name], MD, have reviewed all documentation for this visit. The documentation on 07/11/21 for the exam, diagnosis, procedures, and orders are all accurate and complete.   IF YOU HAVE BEEN REFERRED TO A SPECIALIST, IT MAY TAKE 1-2 WEEKS TO SCHEDULE/PROCESS THE REFERRAL. IF YOU HAVE NOT HEARD FROM US/SPECIALIST IN TWO WEEKS, PLEASE GIVE [place] A CALL AT [phone].   THE PATIENT IS ENCOURAGED TO PRACTICE SOCIAL DISTANCING DUE TO THE COVID-19 PANDEMIC.

## 2021-07-12 ENCOUNTER — Telehealth: Payer: Self-pay | Admitting: *Deleted

## 2021-07-12 NOTE — Chronic Care Management (AMB) (Signed)
Chronic Care Management   Note  07/12/2021 Name: Miranda Roman MRN: [id number] DOB: [birth year]  Miranda Roman is a 76 y.o. year old female who is a primary care patient of Glendale Chard, MD. I reached out to Sandy Salaam by phone today in response to a referral sent by [name]'s PCP.  [name] was given information about Chronic Care Management services today including:  CCM service includes personalized support from designated clinical staff supervised by her physician, including individualized plan of care and coordination with other care providers 24/7 contact phone numbers for assistance for urgent and routine care needs. Service will only be billed when office clinical staff spend 20 minutes or more in a month to coordinate care. Only one practitioner may furnish and bill the service in a calendar month. The patient may stop CCM services at any time (effective at the end of the month) by phone call to the office staff. The patient is responsible for co-pay (up to 20% after annual deductible is met) if co-pay is required by the individual health plan.   Patient agreed to services and verbal consent obtained.   Follow up plan: Telephone appointment with care management team member scheduled for:07/16/21  [place] Management  Direct Dial: [phone]

## 2021-07-13 ENCOUNTER — Other Ambulatory Visit: Payer: Self-pay

## 2021-07-13 DIAGNOSIS — N184 Chronic kidney disease, stage 4 (severe): Secondary | ICD-10-CM

## 2021-07-15 DIAGNOSIS — M25561 Pain in right knee: Secondary | ICD-10-CM | POA: Diagnosis not present

## 2021-07-16 ENCOUNTER — Telehealth: Payer: Medicare Other

## 2021-07-16 ENCOUNTER — Telehealth: Payer: Self-pay

## 2021-07-16 NOTE — Telephone Encounter (Signed)
Care Management   Follow Up Note   07/16/2021 Name: Miranda Roman MRN: [id number] DOB: [birth year]   Referred by: Glendale Chard, MD Reason for referral : Chronic Care Management (Unsuccessful call)   An unsuccessful telephone outreach was attempted today. The patient was referred to the case management team for assistance with care management and care coordination. Unfortunately, the patients voice mailbox is full which prohibited SW from leaving a HIPAA compliant voice message requesting a return call.  Follow Up Plan: The care management team will reach out to the patient again over the next 30 days.   Daneen Schick, BSW, CDP Social Worker, Certified Dementia Practitioner [place] [facility] [place] [facility] [phone]

## 2021-07-17 DIAGNOSIS — M84361A Stress fracture, right tibia, initial encounter for fracture: Secondary | ICD-10-CM | POA: Diagnosis not present

## 2021-07-17 DIAGNOSIS — S82141A Displaced bicondylar fracture of right tibia, initial encounter for closed fracture: Secondary | ICD-10-CM

## 2021-07-17 HISTORY — DX: Displaced bicondylar fracture of right tibia, initial encounter for closed fracture: S82.141A

## 2021-07-17 NOTE — Addendum Note (Signed)
Addended by: [name] on: 07/17/2021 09:53 PM   Modules accepted: Orders

## 2021-07-24 ENCOUNTER — Telehealth: Payer: Medicare Other

## 2021-07-24 ENCOUNTER — Telehealth: Payer: Self-pay

## 2021-07-24 DIAGNOSIS — H2511 Age-related nuclear cataract, right eye: Secondary | ICD-10-CM | POA: Diagnosis not present

## 2021-07-24 DIAGNOSIS — H25812 Combined forms of age-related cataract, left eye: Secondary | ICD-10-CM | POA: Diagnosis not present

## 2021-07-24 DIAGNOSIS — H25811 Combined forms of age-related cataract, right eye: Secondary | ICD-10-CM | POA: Diagnosis not present

## 2021-07-24 NOTE — Telephone Encounter (Signed)
Care Management   Follow Up Note   07/24/2021 Name: Miranda Roman MRN: [id number] DOB: Jul 06, 1946   Referred by: Glendale Chard, MD Reason for referral : Chronic Care Management (Unsuccessful call)   A second unsuccessful telephone outreach was attempted today. The patient was referred to the case management team for assistance with care management and care coordination. SW left a HIPAA compliant voice message requesting a return call.  Follow Up Plan: The care management team will reach out to the patient again over the next 30 days.   Daneen Schick, BSW, CDP Social Worker, Certified Dementia Practitioner [place] [facility] [place] [facility] [phone]

## 2021-07-24 NOTE — Progress Notes (Signed)
Office Note     CC:  ESRD Requesting Provider:  Glendale Chard, MD  HPI: Miranda Roman is a Right handed 76 y.o. ([birth year]) female with kidney disease who presents at the request of Miranda Jeans, MD for permanent HD access. The patient has had no prior access procedures. No tunneled lines. Currently CKD 4.  On exam, Miranda Roman was doing well, accompanied by her daughter.  Originally from [place], she has been in [place] for a number of years.  She is a retired Customer service manager.  Her husband passed away in 2007/08/23.  Miranda Roman had no complaints.  She was interested in surgical discussion.  The pt is  on a statin for cholesterol management.  The pt is - on a daily aspirin.   Other AC:  - The pt is  on medications for hypertension.   The pt is  diabetic. 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[place])     Past Surgical History:  Procedure Laterality Date   BONE RESECTION  01/2006   wrist   GASTRIC BYPASS  Aug 22, 2009   OTHER SURGICAL HISTORY     sickle cell retinopathy laser surgery   TUBAL LIGATION  04/28/1977    Social History   Socioeconomic History   Marital status: Widowed    Spouse name: Not on file   Number of children: Not on file   Years of education: Not on file   Highest education level: Not on file  Occupational History   Occupation: retired  Tobacco Use   Smoking status: Former    Packs/day: 0.25    Years: 43.00    Pack years: 10.75    Types: Cigarettes    Start date: 04/1962    Quit date: 04/2005    Years since quitting: 16.3   Smokeless tobacco: Never  Vaping Use   Vaping Use: Never used  Substance and Sexual Activity   Alcohol use: Not Currently    Comment: occasionally   Drug use: No   Sexual activity: Not Currently  Other Topics Concern   Not on file  Social History Narrative   Right handed   Lives with  Daughter in one story   Social Determinants of Health   Financial Resource Strain: Low Risk    Difficulty of Paying Living Expenses: Not hard at all  Food Insecurity: No Food Insecurity   Worried About Charity fundraiser in the Last Year: Never true   [place] in the Last Year: Never true  Transportation Needs: No Transportation Needs   Lack of Transportation (Medical): No   Lack of Transportation (Non-Medical): No  Physical Activity: Insufficiently Active   Days of Exercise per Week: 2 days   Minutes of Exercise per Session: 30 min  Stress: No Stress Concern Present   Feeling of Stress : Not at all  Social Connections: Not on file  Intimate Partner Violence: Not on file    Family History  Problem Relation Age of Onset   Diabetes Mother    Heart disease Mother    Hypertension Mother    Stroke Mother    Diabetes Father    Heart disease Father    Diabetes Sister    Diabetes Brother    Hypertension Brother    Sickle cell anemia Brother  Diabetes Maternal Aunt    Diabetes Maternal Uncle    Diabetes Maternal Grandmother    Diabetes Maternal Grandfather    Sickle cell anemia Sister    Sickle cell anemia Sister    Healthy Son    Healthy Son    Healthy Daughter     Current Outpatient Medications  Medication Sig Dispense Refill   acetaminophen (TYLENOL) 500 MG tablet Take 1 tablet (500 mg total) by mouth every 6 (six) hours as needed for moderate pain or fever. 30 tablet 0   amLODipine (NORVASC) 2.5 MG tablet Take 2.5 mg by mouth daily.     atorvastatin (LIPITOR) 20 MG tablet TAKE ONE TABLET BY MOUTH M-F AND SKIP WEEKENDS 75 tablet 1   AZO-CRANBERRY PO Take 1 capsule by mouth daily at 6 (six) AM.     b complex vitamins capsule Take 1 capsule by mouth daily.     BD INSULIN SYRINGE U/F 31G X 5/16" 0.3 ML MISC See admin instructions. use with insulin     calcitRIOL (ROCALTROL) 0.25 MCG capsule Take 0.25 mcg by mouth daily.     cetirizine (ZYRTEC) 10 MG tablet Take  10 mg by mouth daily.     Cyanocobalamin (VITAMIN B12) 1000 MCG TBCR 1 tablet     diclofenac Sodium (VOLTAREN) 1 % GEL Apply 2 g topically 4 (four) times daily. Apply to left knee. 50 g 0   epoetin alfa (PROCRIT) 38333 UNIT/ML injection Every other week     estradiol (ESTRACE) 0.1 MG/GM vaginal cream      gabapentin (NEURONTIN) 100 MG capsule One capsule po qhs 90 capsule 2   HUMALOG 100 UNIT/ML injection INJECT 4 UNIT(S) SUBCUTANEOUSLY 3 TIMES DAILY (Patient taking differently: once. INJECT 4 UNIT(S) SUBCUTANEOUSLY 3 TIMES DAILY) 10 mL 1   insulin detemir (LEVEMIR FLEXTOUCH) 100 UNIT/ML FlexPen 6 Units daily.     Insulin Pen Needle (BD PEN NEEDLE NANO U/F) 32G X 4 MM MISC Use as directed with insulin pen 150 each 2   metoprolol tartrate (LOPRESSOR) 50 MG tablet TAKE 1 TABLET BY MOUTH TWICE A DAY 180 tablet 1   Multiple Vitamins-Minerals (CENTRUM SILVER PO) Take by mouth daily.     OneTouch Delica Lancets 83A MISC See admin instructions.     sodium bicarbonate 650 MG tablet Take 650 mg by mouth 2 (two) times daily.     No current facility-administered medications for this visit.    Allergies  Allergen Reactions   Cefepime Other (See Comments)    Causes encephalitis - reported by [facility] 12/31/2019   Lactose Intolerance (Gi)     Per pt's daughter causes gas     REVIEW OF SYSTEMS:   _0  denotes positive finding, _1  denotes negative finding Cardiac  Comments:  Chest pain or chest pressure:    Shortness of breath upon exertion:    Short of breath when lying flat:    Irregular heart rhythm:        Vascular    Pain in calf, thigh, or hip brought on by ambulation:    Pain in feet at night that wakes you up from your sleep:     Blood clot in your veins:    Leg swelling:         Pulmonary    Oxygen at home:    Productive cough:     Wheezing:         Neurologic    Sudden weakness in arms or legs:  Sudden numbness in arms or legs:     Sudden onset of difficulty  speaking or slurred speech:    Temporary loss of vision in one eye:     Problems with dizziness:         Gastrointestinal    Blood in stool:     Vomited blood:         Genitourinary    Burning when urinating:     Blood in urine:        Psychiatric    Major depression:         Hematologic    Bleeding problems:    Problems with blood clotting too easily:        Skin    Rashes or ulcers:        Constitutional    Fever or chills:      PHYSICAL EXAMINATION:  There were no vitals filed for this visit.  General:  WDWN in NAD; vital signs documented above Gait: Not observed HENT: WNL, normocephalic Pulmonary: normal non-labored breathing Cardiac: regular HR,  Abdomen: soft, NT, no masses Skin: without rashes Vascular Exam/Pulses:  Right Left  Radial 2+ (normal) 2+ (normal)  Ulnar 2+ (normal) 2+ (normal)                   Extremities: without ischemic changes, without Gangrene , without cellulitis; without open wounds;  Musculoskeletal: no muscle wasting or atrophy  Neurologic: A&O X 3;  No focal weakness or paresthesias are detected Psychiatric:  The pt has Normal affect.   Non-Invasive Vascular Imaging:   +-----------------+-------------+----------+--------------+   Right Cephalic    Diameter (cm) Depth (cm)    Findings      +-----------------+-------------+----------+--------------+   Shoulder              0.09                                  +-----------------+-------------+----------+--------------+   Mid upper arm         0.11                                  +-----------------+-------------+----------+--------------+   Antecubital fossa     0.10                                  +-----------------+-------------+----------+--------------+   Prox forearm          0.11                                  +-----------------+-------------+----------+--------------+   Mid forearm                                not visualized    +-----------------+-------------+----------+--------------+   Dist forearm          0.07                                  +-----------------+-------------+----------+--------------+   +-----------------+-------------+----------+--------+   Right Basilic     Diameter (cm) Depth (cm) Findings   +-----------------+-------------+----------+--------+   Mid upper arm  0.23                            +-----------------+-------------+----------+--------+   Dist upper arm        0.19                            +-----------------+-------------+----------+--------+   Antecubital fossa     0.09                            +-----------------+-------------+----------+--------+   +-----------------+-------------+----------+---------------+   Left Cephalic     Diameter (cm) Depth (cm)    Findings       +-----------------+-------------+----------+---------------+   Shoulder              0.13                                   +-----------------+-------------+----------+---------------+   Mid upper arm         0.08                                   +-----------------+-------------+----------+---------------+   Dist upper arm        0.11                                   +-----------------+-------------+----------+---------------+   Antecubital fossa     0.12                                   +-----------------+-------------+----------+---------------+   Prox forearm          0.15                                   +-----------------+-------------+----------+---------------+   Mid forearm           0.09                 thickened walls   +-----------------+-------------+----------+---------------+   Dist forearm                               not visualized    +-----------------+-------------+----------+---------------+   +-----------------+-------------+----------+--------+   Left Basilic      Diameter (cm) Depth (cm) Findings   +-----------------+-------------+----------+--------+   Mid upper  arm         0.18                            +-----------------+-------------+----------+--------+   Dist upper arm        0.20                            +-----------------+-------------+----------+--------+   Antecubital fossa     0.16                            +-----------------+-------------+----------+--------+     ASSESSMENT/PLAN:  Carrington Clamp  Baumbach is a 76 y.o. female who presents with chronic kidney disease stage 4  Based on vein mapping and examination, Jonette does not have vein suitable for arteriovenous fistula.  She will require an arteriovenous graft. I had an extensive discussion with this patient in regards to the nature of access surgery, including risk, benefits, and alternatives.   The patient is aware that the risks of access surgery include but are not limited to: bleeding, infection, steal syndrome, nerve damage, ischemic monomelic neuropathy, failure of access to mature, complications related to venous hypertension, and possible need for additional access procedures in the future. Being that she has stage IV chronic kidney disease, Dr. Royce Macadamia has asked to hold on graft placement until kidneys decline further.  I gave Elyssa our office number and asked her to call if any questions or concerns arose, as I am happy to be involved in her care.  Broadus John, MD [facility] [phone]

## 2021-07-26 ENCOUNTER — Ambulatory Visit: Payer: Medicare Other | Admitting: Vascular Surgery

## 2021-07-26 ENCOUNTER — Ambulatory Visit (INDEPENDENT_AMBULATORY_CARE_PROVIDER_SITE_OTHER)
Admission: RE | Admit: 2021-07-26 | Discharge: 2021-07-26 | Disposition: A | Discharge status: Home / Self Care | Payer: Medicare Other | Source: Ambulatory Visit | Attending: Vascular Surgery | Admitting: Vascular Surgery

## 2021-07-26 ENCOUNTER — Other Ambulatory Visit: Payer: Self-pay

## 2021-07-26 ENCOUNTER — Ambulatory Visit (HOSPITAL_COMMUNITY)
Admission: RE | Admit: 2021-07-26 | Discharge: 2021-07-26 | Disposition: A | Discharge status: Home / Self Care | Payer: Medicare Other | Source: Ambulatory Visit | Attending: Vascular Surgery | Admitting: Vascular Surgery

## 2021-07-26 ENCOUNTER — Encounter: Payer: Self-pay | Admitting: Vascular Surgery

## 2021-07-26 VITALS — BP 149/82 | HR 80 | Temp 98.1°F | Resp 20 | Ht 63.0 in | Wt 162.0 lb

## 2021-07-26 DIAGNOSIS — N184 Chronic kidney disease, stage 4 (severe): Secondary | ICD-10-CM | POA: Diagnosis not present

## 2021-07-30 ENCOUNTER — Telehealth: Payer: Self-pay | Admitting: *Deleted

## 2021-07-30 DIAGNOSIS — I129 Hypertensive chronic kidney disease with stage 1 through stage 4 chronic kidney disease, or unspecified chronic kidney disease: Secondary | ICD-10-CM | POA: Diagnosis not present

## 2021-07-30 DIAGNOSIS — M810 Age-related osteoporosis without current pathological fracture: Secondary | ICD-10-CM | POA: Diagnosis not present

## 2021-07-30 DIAGNOSIS — Z9181 History of falling: Secondary | ICD-10-CM | POA: Diagnosis not present

## 2021-07-30 DIAGNOSIS — Z794 Long term (current) use of insulin: Secondary | ICD-10-CM | POA: Diagnosis not present

## 2021-07-30 DIAGNOSIS — E1122 Type 2 diabetes mellitus with diabetic chronic kidney disease: Secondary | ICD-10-CM | POA: Diagnosis not present

## 2021-07-30 DIAGNOSIS — E785 Hyperlipidemia, unspecified: Secondary | ICD-10-CM | POA: Diagnosis not present

## 2021-07-30 DIAGNOSIS — I2723 Pulmonary hypertension due to lung diseases and hypoxia: Secondary | ICD-10-CM | POA: Diagnosis not present

## 2021-07-30 DIAGNOSIS — R2689 Other abnormalities of gait and mobility: Secondary | ICD-10-CM | POA: Diagnosis not present

## 2021-07-30 DIAGNOSIS — N184 Chronic kidney disease, stage 4 (severe): Secondary | ICD-10-CM | POA: Diagnosis not present

## 2021-07-30 DIAGNOSIS — D631 Anemia in chronic kidney disease: Secondary | ICD-10-CM | POA: Diagnosis not present

## 2021-07-30 DIAGNOSIS — I10 Essential (primary) hypertension: Secondary | ICD-10-CM | POA: Diagnosis not present

## 2021-07-30 NOTE — Chronic Care Management (AMB) (Signed)
Care Management   Note  07/30/2021 Name: Miranda Roman MRN: [id number] DOB: 1946/02/24  Miranda Roman is a 76 y.o. year old female who is a primary care patient of Glendale Chard, MD and is actively engaged with the care management team. I reached out to Sandy Salaam by phone today to assist with re-scheduling an initial visit with the BSW  Follow up plan: Unsuccessful telephone outreach attempt made. The care management team will reach out to the patient again over the next 7 days. If patient returns call to provider office, please advise to call [place] at [phone].  [place] [facility]  Direct Dial: [phone]

## 2021-07-31 DIAGNOSIS — H2512 Age-related nuclear cataract, left eye: Secondary | ICD-10-CM | POA: Diagnosis not present

## 2021-07-31 DIAGNOSIS — H25012 Cortical age-related cataract, left eye: Secondary | ICD-10-CM | POA: Diagnosis not present

## 2021-08-05 ENCOUNTER — Ambulatory Visit (INDEPENDENT_AMBULATORY_CARE_PROVIDER_SITE_OTHER): Payer: Medicare Other | Admitting: Ophthalmology

## 2021-08-05 ENCOUNTER — Encounter (INDEPENDENT_AMBULATORY_CARE_PROVIDER_SITE_OTHER): Payer: Medicare Other | Admitting: Ophthalmology

## 2021-08-05 ENCOUNTER — Other Ambulatory Visit: Payer: Self-pay

## 2021-08-05 ENCOUNTER — Encounter (INDEPENDENT_AMBULATORY_CARE_PROVIDER_SITE_OTHER): Payer: Self-pay | Admitting: Ophthalmology

## 2021-08-05 ENCOUNTER — Encounter (INDEPENDENT_AMBULATORY_CARE_PROVIDER_SITE_OTHER): Payer: Self-pay

## 2021-08-05 DIAGNOSIS — H43812 Vitreous degeneration, left eye: Secondary | ICD-10-CM | POA: Diagnosis not present

## 2021-08-05 DIAGNOSIS — H4311 Vitreous hemorrhage, right eye: Secondary | ICD-10-CM

## 2021-08-05 HISTORY — DX: Vitreous hemorrhage, right eye: H43.11

## 2021-08-05 HISTORY — DX: Vitreous degeneration, left eye: H43.812

## 2021-08-05 MED ORDER — PREDNISOLONE ACETATE 1 % OP SUSP: 1.0000 [drp] | Freq: Four times a day (QID) | OPHTHALMIC | 0 refills | Status: AC

## 2021-08-05 MED ORDER — OFLOXACIN 0.3 % OP SOLN: 1.0000 [drp] | Freq: Four times a day (QID) | OPHTHALMIC | 0 refills | Status: AC

## 2021-08-05 NOTE — Assessment & Plan Note (Signed)
Dense central vitreous hemorrhage OD of unknown origin, common etiologies are PVD, or ruptured retinal artery macro aneurysm (RAM) for this type of central vitreous hemorrhage, however must also be on look out for [facility], or retinal tear detachment  None of these latter entities are seen on peripheral retina examination or by B-scan ultrasound  Recovered acuity from this dense hemorrhage do recommend vitrectomy under local MAC anesthesia and I explained that we will not use much in the way of sedation to minimize worsening of organic encephalopathy

## 2021-08-05 NOTE — Progress Notes (Addendum)
08/05/2021     CHIEF COMPLAINT Patient presents for Retina Evaluation   HISTORY OF PRESENT ILLNESS: Miranda Roman is a 76 y.o. female who presents to the clinic today for:   HPI     Retina Evaluation           Laterality: right eye   Onset: weeks ago   Associated Symptoms: Floaters.  Negative for Flashes, Distortion, Blind Spot, Pain and Redness         Comments   NP- change in vision post cataract extraction OU, referred by Dr. Gershon Crane. Pt states "a couple weeks before surgery, I saw these black lines that look like hair." Dr Gershon Crane held off on the surgery because she hurt her knee. She then had her cataract surgery and she explains it as not a change in vision, nothing is darker or worse except the lines in my vision." Pt states she is seeing the lines only in her right eye. Right eye cataract surgery done 07/24/21, left eye cataract surgery done 07/31/21. Pt had appointment with Dr. Gershon Crane at 9:45 AM today. Pt is using Ketorolac TID OU, Ofloxacin TID OU, and Prednisolone TID OU.       Last edited by Laurin Coder on 08/05/2021  1:49 PM.        HISTORICAL INFORMATION:   Selected notes from the MEDICAL RECORD NUMBER    Lab Results  Component Value Date   HGBA1C 5.1 05/16/2021     CURRENT MEDICATIONS: Current Outpatient Medications (Ophthalmic Drugs)  Medication Sig   ofloxacin (OCUFLOX) 0.3 % ophthalmic solution Place 1 drop into the right eye in the morning, at noon, in the evening, and at bedtime for 21 doses.   prednisoLONE acetate (PRED FORTE) 1 % ophthalmic suspension Place 1 drop into the right eye 4 (four) times daily for 21 doses.   No current facility-administered medications for this visit. (Ophthalmic Drugs)   Current Outpatient Medications (Other)  Medication Sig   acetaminophen (TYLENOL) 500 MG tablet Take 1 tablet (500 mg total) by mouth every 6 (six) hours as needed for moderate pain or fever.   amLODipine (NORVASC) 2.5 MG tablet Take  2.5 mg by mouth daily.   atorvastatin (LIPITOR) 20 MG tablet TAKE ONE TABLET BY MOUTH M-F AND SKIP WEEKENDS   AZO-CRANBERRY PO Take 1 capsule by mouth daily at 6 (six) AM.   b complex vitamins capsule Take 1 capsule by mouth daily.   BD INSULIN SYRINGE U/F 31G X 5/16" 0.3 ML MISC See admin instructions. use with insulin   calcitRIOL (ROCALTROL) 0.25 MCG capsule Take 0.25 mcg by mouth daily.   cetirizine (ZYRTEC) 10 MG tablet Take 10 mg by mouth daily.   Cyanocobalamin (VITAMIN B12) 1000 MCG TBCR 1 tablet   diclofenac Sodium (VOLTAREN) 1 % GEL Apply 2 g topically 4 (four) times daily. Apply to left knee.   epoetin alfa (PROCRIT) 96940 UNIT/ML injection Every other week   estradiol (ESTRACE) 0.1 MG/GM vaginal cream    gabapentin (NEURONTIN) 100 MG capsule One capsule po qhs   HUMALOG 100 UNIT/ML injection INJECT 4 UNIT(S) SUBCUTANEOUSLY 3 TIMES DAILY (Patient taking differently: once. INJECT 4 UNIT(S) SUBCUTANEOUSLY 3 TIMES DAILY)   insulin detemir (LEVEMIR FLEXTOUCH) 100 UNIT/ML FlexPen 6 Units daily.   Insulin Pen Needle (BD PEN NEEDLE NANO U/F) 32G X 4 MM MISC Use as directed with insulin pen   metoprolol tartrate (LOPRESSOR) 50 MG tablet TAKE 1 TABLET BY MOUTH TWICE A DAY  Multiple Vitamins-Minerals (CENTRUM SILVER PO) Take by mouth daily.   OneTouch Delica Lancets 51G MISC See admin instructions.   sodium bicarbonate 650 MG tablet Take 650 mg by mouth 2 (two) times daily.   No current facility-administered medications for this visit. 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[place])    Past  Surgical History:  Procedure Laterality Date   BONE RESECTION  01/2006   wrist   GASTRIC BYPASS  08/14/2009   OTHER SURGICAL HISTORY     sickle cell retinopathy laser surgery   TUBAL LIGATION  04/28/1977    FAMILY HISTORY Family History  Problem Relation Age of Onset   Diabetes Mother    Heart disease Mother    Hypertension Mother    Stroke Mother    Diabetes Father    Heart disease Father    Diabetes Sister    Diabetes Brother    Hypertension Brother    Sickle cell anemia Brother    Diabetes Maternal Aunt    Diabetes Maternal Uncle    Diabetes Maternal Grandmother    Diabetes Maternal Grandfather    Sickle cell anemia Sister    Sickle cell anemia Sister    Healthy Son    Healthy Son    Healthy Daughter     SOCIAL HISTORY Social History   Tobacco Use   Smoking status: Former    Packs/day: 0.25    Years: 43.00    Pack years: 10.75    Types: Cigarettes    Start date: 04/1962    Quit date: 04/2005    Years since quitting: 16.3   Smokeless tobacco: Never  Vaping Use   Vaping Use: Never used  Substance Use Topics   Alcohol use: Not Currently    Comment: occasionally   Drug use: No         OPHTHALMIC EXAM:  Base Eye Exam     Visual Acuity (ETDRS)       Right Left   Dist [place] 20/400 20/400   Dist ph [place] NI 20/200         Tonometry (Tonopen, 1:56 PM)       Right Left   Pressure 25 15         Pupils       Dark Light Shape React APD   Right 4 3 Round  None   Left 5 5 Round Minimal None  Pt dilated at Dr. Kellie Moor office this morning.        Visual Fields (Counting fingers)       Left Right   Restrictions Partial outer inferior nasal deficiency Partial outer superior temporal, inferior temporal, inferior nasal deficiencies         Extraocular Movement       Right Left    Full Full         Neuro/Psych     Oriented x3: Yes   Mood/Affect: Normal         Dilation     Both eyes: 1.0% Mydriacyl,  2.5% Phenylephrine @ 1:56  PM  Pt was also dilated at Dr. Kellie Moor office this morning          Slit Lamp and Fundus Exam     External Exam       Right Left   External Normal Normal         Slit Lamp Exam       Right Left   Lids/Lashes Normal Normal   Conjunctiva/Sclera White and quiet White and quiet   Cornea Clear Clear   Anterior Chamber Deep and quiet Deep and quiet   Iris Round and reactive Round and reactive   Lens Centered posterior chamber intraocular lens Centered posterior chamber intraocular lens   Anterior Vitreous Normal Normal         Fundus Exam       Right Left   Posterior Vitreous Vitreous hemorrhage, central in the visual axis Clear media   Disc No view Normal   C/D Ratio No details 0.55   Macula No details Normal   Vessels Appear normal peripherally Normal   Periphery Periphery attached Large superotemporal chorioretinal scar, retina attached            IMAGING AND PROCEDURES  Imaging and Procedures for _0 @  OCT, Retina - OU - Both Eyes       Right Eye Progression has no prior data.   Left Eye Central Foveal Thickness: 220. Progression has no prior data.   Notes Medial opacity OD prevents evaluation of macula OD  OS diffuse macular atrophy     Color Fundus Photography Optos - OU - Both Eyes       Right Eye Progression has no prior data.   Left Eye Progression has no prior data. Disc findings include increased cup to disc ratio.   Notes Dense central visual axis hemorrhage retinal periphery OD is a attached  OS large chorioretinal scar temporally, clear media     B-Scan Ultrasound - OD - Right Eye       Quality was good. Findings included subhyaloid opacities, vitreous hemorrhage.   Notes No retinal detachment, no choroidal hemorrhages, no retinal hemorrhages, no tears  Dense central vitreous opacity, hemorrhage             ASSESSMENT/PLAN:    ICD-10-CM   1. Vitreous hemorrhage of right eye (HCC)  H43.11 Color Fundus  Photography Optos - OU - Both Eyes    B-Scan Ultrasound - OD - Right Eye    2. Posterior vitreous detachment of left eye  H43.812 OCT, Retina - OU - Both Eyes      Ophthalmic Meds Ordered this visit:  Meds ordered this encounter  Medications   ofloxacin (OCUFLOX) 0.3 % ophthalmic solution    Sig: Place 1 drop into the right eye in the morning, at noon, in the evening, and at bedtime for 21 doses.    Dispense:  5 mL    Refill:  0   prednisoLONE acetate (PRED FORTE) 1 % ophthalmic suspension    Sig: Place 1 drop into the right eye 4 (four) times daily for 21 doses.    Dispense:  5 mL    Refill:  0        Pre-op completed. Operative consent obtained with pre-op eye drops reviewed with Sandy Salaam and sent via [facility] as needed. Post op instructions reviewed with patient and per patient all questions answered.  [place], COA

## 2021-08-05 NOTE — Progress Notes (Signed)
08/05/2021     CHIEF COMPLAINT Patient presents for  Chief Complaint  Patient presents with   Retina Evaluation      HISTORY OF PRESENT ILLNESS: Miranda Roman is a 76 y.o. female who presents to the clinic today for:   HPI     Retina Evaluation           Laterality: right eye   Onset: weeks ago   Associated Symptoms: Floaters.  Negative for Flashes, Distortion, Blind Spot, Pain and Redness         Comments   NP- change in vision post cataract extraction OU, referred by Dr. Gershon Crane. Pt states "a couple weeks before surgery, I saw these black lines that look like hair." Dr Gershon Crane held off on the surgery because she hurt her knee. She then had her cataract surgery and she explains it as not a change in vision, nothing is darker or worse except the lines in my vision." Pt states she is seeing the lines only in her right eye. Right eye cataract surgery done 07/24/21, left eye cataract surgery done 07/31/21. Pt had appointment with Dr. Gershon Crane at 9:45 AM today. Pt is using Ketorolac TID OU, Ofloxacin TID OU, and Prednisolone TID OU.       Last edited by Laurin Coder on 08/05/2021  1:49 PM.      Referring physician: Glendale Chard, MD [address] [place],  [place] [address]  HISTORICAL INFORMATION:   Selected notes from the MEDICAL RECORD NUMBER    Lab Results  Component Value Date   HGBA1C 5.1 05/16/2021     CURRENT MEDICATIONS: No current outpatient medications on file. (Ophthalmic Drugs)   No current facility-administered medications for this visit. (Ophthalmic Drugs)   Current Outpatient Medications (Other)  Medication Sig   acetaminophen (TYLENOL) 500 MG tablet Take 1 tablet (500 mg total) by mouth every 6 (six) hours as needed for moderate pain or fever.   amLODipine (NORVASC) 2.5 MG tablet Take 2.5 mg by mouth daily.   atorvastatin (LIPITOR) 20 MG tablet TAKE ONE TABLET BY MOUTH M-F AND SKIP WEEKENDS   AZO-CRANBERRY PO Take 1 capsule  by mouth daily at 6 (six) AM.   b complex vitamins capsule Take 1 capsule by mouth daily.   BD INSULIN SYRINGE U/F 31G X 5/16" 0.3 ML MISC See admin instructions. use with insulin   calcitRIOL (ROCALTROL) 0.25 MCG capsule Take 0.25 mcg by mouth daily.   cetirizine (ZYRTEC) 10 MG tablet Take 10 mg by mouth daily.   Cyanocobalamin (VITAMIN B12) 1000 MCG TBCR 1 tablet   diclofenac Sodium (VOLTAREN) 1 % GEL Apply 2 g topically 4 (four) times daily. Apply to left knee.   epoetin alfa (PROCRIT) 67124 UNIT/ML injection Every other week   estradiol (ESTRACE) 0.1 MG/GM vaginal cream    gabapentin (NEURONTIN) 100 MG capsule One capsule po qhs   HUMALOG 100 UNIT/ML injection INJECT 4 UNIT(S) SUBCUTANEOUSLY 3 TIMES DAILY (Patient taking differently: once. INJECT 4 UNIT(S) SUBCUTANEOUSLY 3 TIMES DAILY)   insulin detemir (LEVEMIR FLEXTOUCH) 100 UNIT/ML FlexPen 6 Units daily.   Insulin Pen Needle (BD PEN NEEDLE NANO U/F) 32G X 4 MM MISC Use as directed with insulin pen   metoprolol tartrate (LOPRESSOR) 50 MG tablet TAKE 1 TABLET BY MOUTH TWICE A DAY   Multiple Vitamins-Minerals (CENTRUM SILVER PO) Take by mouth daily.   OneTouch Delica Lancets 58K MISC See admin instructions.   sodium bicarbonate 650 MG tablet Take 650  mg by mouth 2 (two) times daily.   No current facility-administered medications for this visit. 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[place])    Past Surgical History:  Procedure Laterality Date   BONE RESECTION  01/2006   wrist   GASTRIC BYPASS  08/14/2009   OTHER  SURGICAL HISTORY     sickle cell retinopathy laser surgery   TUBAL LIGATION  04/28/1977    FAMILY HISTORY Family History  Problem Relation Age of Onset   Diabetes Mother    Heart disease Mother    Hypertension Mother    Stroke Mother    Diabetes Father    Heart disease Father    Diabetes Sister    Diabetes Brother    Hypertension Brother    Sickle cell anemia Brother    Diabetes Maternal Aunt    Diabetes Maternal Uncle    Diabetes Maternal Grandmother    Diabetes Maternal Grandfather    Sickle cell anemia Sister    Sickle cell anemia Sister    Healthy Son    Healthy Son    Healthy Daughter     SOCIAL HISTORY Social History   Tobacco Use   Smoking status: Former    Packs/day: 0.25    Years: 43.00    Pack years: 10.75    Types: Cigarettes    Start date: 04/1962    Quit date: 04/2005    Years since quitting: 16.3   Smokeless tobacco: Never  Vaping Use   Vaping Use: Never used  Substance Use Topics   Alcohol use: Not Currently    Comment: occasionally   Drug use: No         OPHTHALMIC EXAM:  Base Eye Exam     Visual Acuity (ETDRS)       Right Left   Dist [place] 20/400 20/400   Dist ph [place] NI 20/200         Tonometry (Tonopen, 1:56 PM)       Right Left   Pressure 25 15         Pupils       Dark Light Shape React APD   Right 4 3 Round  None   Left 5 5 Round Minimal None  Pt dilated at Dr. Kellie Moor office this morning.        Visual Fields (Counting fingers)       Left Right   Restrictions Partial outer inferior nasal deficiency Partial outer superior temporal, inferior temporal, inferior nasal deficiencies         Extraocular Movement       Right Left    Full Full         Neuro/Psych     Oriented x3: Yes   Mood/Affect: Normal         Dilation     Both eyes: 1.0% Mydriacyl, 2.5% Phenylephrine @ 1:56 PM  Pt was also dilated at Dr. Kellie Moor office this morning  Slit Lamp and Fundus Exam     External  Exam       Right Left   External Normal Normal         Slit Lamp Exam       Right Left   Lids/Lashes Normal Normal   Conjunctiva/Sclera White and quiet White and quiet   Cornea Clear Clear   Anterior Chamber Deep and quiet Deep and quiet   Iris Round and reactive Round and reactive   Lens Centered posterior chamber intraocular lens Centered posterior chamber intraocular lens   Anterior Vitreous Normal Normal         Fundus Exam       Right Left   Posterior Vitreous Vitreous hemorrhage, central in the visual axis Clear media   Disc No view Normal   C/D Ratio No details 0.55   Macula No details Normal   Vessels Appear normal peripherally Normal   Periphery Periphery attached Large superotemporal chorioretinal scar, retina attached            IMAGING AND PROCEDURES  Imaging and Procedures for 08/05/21  OCT, Retina - OU - Both Eyes       Right Eye Progression has no prior data.   Left Eye Central Foveal Thickness: 220. Progression has no prior data.   Notes Medial opacity OD prevents evaluation of macula OD  OS diffuse macular atrophy     Color Fundus Photography Optos - OU - Both Eyes       Right Eye Progression has no prior data.   Left Eye Progression has no prior data. Disc findings include increased cup to disc ratio.   Notes Dense central visual axis hemorrhage retinal periphery OD is a attached  OS large chorioretinal scar temporally, clear media     B-Scan Ultrasound - OD - Right Eye       Quality was good. Findings included subhyaloid opacities, vitreous hemorrhage.   Notes No retinal detachment, no choroidal hemorrhages, no retinal hemorrhages, no tears  Dense central vitreous opacity, hemorrhage             ASSESSMENT/PLAN:  Vitreous hemorrhage of right eye (HCC) Dense central vitreous hemorrhage OD of unknown origin, common etiologies are PVD, or ruptured retinal artery macro aneurysm (RAM) for this type of central  vitreous hemorrhage, however must also be on look out for [facility], or retinal tear detachment  None of these latter entities are seen on peripheral retina examination or by B-scan ultrasound  Recovered acuity from this dense hemorrhage do recommend vitrectomy under local MAC anesthesia and I explained that we will not use much in the way of sedation to minimize worsening of organic encephalopathy     ICD-10-CM   1. Vitreous hemorrhage of right eye (HCC)  H43.11 Color Fundus Photography Optos - OU - Both Eyes    B-Scan Ultrasound - OD - Right Eye    2. Posterior vitreous detachment of left eye  H43.812 OCT, Retina - OU - Both Eyes      1.  OD, with dense vitreous hemorrhage, will need to have vitrectomy in order to clear the visual axis and to treat the inciting etiology if it can be determined, most likely statistically speaking a retinal artery macro aneurysm (RAM)  2.  Patient able to proceed in the next 1 or 2 weeks and would best for the care of her eyes so as to maximize visual potential and treat the inciting cause  3.  Risk benefits and the  type of procedure were reviewed with the patient and family.  Ophthalmic Meds Ordered this visit:  No orders of the defined types were placed in this encounter.      Return ,, [facility], for Schedule vitrectomy, endolaser focal, OD,,2-3 weeks.  There are no Patient Instructions on file for this visit.   Explained the diagnoses, plan, and follow up with the patient and they expressed understanding.  Patient expressed understanding of the importance of proper follow up care.   Clent Demark Lutricia Widjaja M.D. Diseases & Surgery of the Retina and Vitreous Retina & Diabetic [place] 08/05/21     Abbreviations: M myopia (nearsighted); A astigmatism; H hyperopia (farsighted); P presbyopia; Mrx spectacle prescription;  CTL contact lenses; OD right eye; OS left eye; OU both eyes  XT exotropia; ET esotropia; PEK punctate epithelial  keratitis; PEE punctate epithelial erosions; DES dry eye syndrome; MGD meibomian gland dysfunction; ATs artificial tears; PFAT's preservative free artificial tears; [place] nuclear sclerotic cataract; PSC posterior subcapsular cataract; ERM epi-retinal membrane; PVD posterior vitreous detachment; RD retinal detachment; DM diabetes mellitus; DR diabetic retinopathy; NPDR non-proliferative diabetic retinopathy; PDR proliferative diabetic retinopathy; CSME clinically significant macular edema; DME diabetic macular edema; dbh dot blot hemorrhages; CWS cotton wool spot; POAG primary open angle glaucoma; C/D cup-to-disc ratio; HVF humphrey visual field; GVF goldmann visual field; OCT optical coherence tomography; IOP intraocular pressure; BRVO Branch retinal vein occlusion; CRVO central retinal vein occlusion; CRAO central retinal artery occlusion; BRAO branch retinal artery occlusion; RT retinal tear; SB scleral buckle; PPV pars plana vitrectomy; VH Vitreous hemorrhage; PRP panretinal laser photocoagulation; IVK intravitreal kenalog; VMT vitreomacular traction; MH Macular hole;  NVD neovascularization of the disc; NVE neovascularization elsewhere; AREDS age related eye disease study; ARMD age related macular degeneration; POAG primary open angle glaucoma; EBMD epithelial/anterior basement membrane dystrophy; ACIOL anterior chamber intraocular lens; IOL intraocular lens; PCIOL posterior chamber intraocular lens; Phaco/IOL phacoemulsification with intraocular lens placement; [place] photorefractive keratectomy; LASIK laser assisted in situ keratomileusis; HTN hypertension; DM diabetes mellitus; COPD chronic obstructive pulmonary disease

## 2021-08-05 NOTE — Progress Notes (Signed)
08/05/2021     CHIEF COMPLAINT Patient presents for Retina Evaluation   HISTORY OF PRESENT ILLNESS: Miranda Roman is a 76 y.o. female who presents to the clinic today for:   HPI     Retina Evaluation           Laterality: right eye   Onset: weeks ago   Associated Symptoms: Floaters.  Negative for Flashes, Distortion, Blind Spot, Pain and Redness         Comments   NP- change in vision post cataract extraction OU, referred by Dr. Gershon Crane. Pt states "a couple weeks before surgery, I saw these black lines that look like hair." Dr Gershon Crane held off on the surgery because she hurt her knee. She then had her cataract surgery and she explains it as not a change in vision, nothing is darker or worse except the lines in my vision." Pt states she is seeing the lines only in her right eye. Right eye cataract surgery done 07/24/21, left eye cataract surgery done 07/31/21. Pt had appointment with Dr. Gershon Crane at 9:45 AM today. Pt is using Ketorolac TID OU, Ofloxacin TID OU, and Prednisolone TID OU.       Last edited by Laurin Coder on 08/05/2021  1:49 PM.        HISTORICAL INFORMATION:   Selected notes from the MEDICAL RECORD NUMBER    Lab Results  Component Value Date   HGBA1C 5.1 05/16/2021     CURRENT MEDICATIONS: No current outpatient medications on file. (Ophthalmic Drugs)   No current facility-administered medications for this visit. (Ophthalmic Drugs)   Current Outpatient Medications (Other)  Medication Sig   acetaminophen (TYLENOL) 500 MG tablet Take 1 tablet (500 mg total) by mouth every 6 (six) hours as needed for moderate pain or fever.   amLODipine (NORVASC) 2.5 MG tablet Take 2.5 mg by mouth daily.   atorvastatin (LIPITOR) 20 MG tablet TAKE ONE TABLET BY MOUTH M-F AND SKIP WEEKENDS   AZO-CRANBERRY PO Take 1 capsule by mouth daily at 6 (six) AM.   b complex vitamins capsule Take 1 capsule by mouth daily.   BD INSULIN SYRINGE U/F 31G X 5/16" 0.3 ML MISC  See admin instructions. use with insulin   calcitRIOL (ROCALTROL) 0.25 MCG capsule Take 0.25 mcg by mouth daily.   cetirizine (ZYRTEC) 10 MG tablet Take 10 mg by mouth daily.   Cyanocobalamin (VITAMIN B12) 1000 MCG TBCR 1 tablet   diclofenac Sodium (VOLTAREN) 1 % GEL Apply 2 g topically 4 (four) times daily. Apply to left knee.   epoetin alfa (PROCRIT) 29021 UNIT/ML injection Every other week   estradiol (ESTRACE) 0.1 MG/GM vaginal cream    gabapentin (NEURONTIN) 100 MG capsule One capsule po qhs   HUMALOG 100 UNIT/ML injection INJECT 4 UNIT(S) SUBCUTANEOUSLY 3 TIMES DAILY (Patient taking differently: once. INJECT 4 UNIT(S) SUBCUTANEOUSLY 3 TIMES DAILY)   insulin detemir (LEVEMIR FLEXTOUCH) 100 UNIT/ML FlexPen 6 Units daily.   Insulin Pen Needle (BD PEN NEEDLE NANO U/F) 32G X 4 MM MISC Use as directed with insulin pen   metoprolol tartrate (LOPRESSOR) 50 MG tablet TAKE 1 TABLET BY MOUTH TWICE A DAY   Multiple Vitamins-Minerals (CENTRUM SILVER PO) Take by mouth daily.   OneTouch Delica Lancets 11B MISC See admin instructions.   sodium bicarbonate 650 MG tablet Take 650 mg by mouth 2 (two) times daily.   No current facility-administered medications for this visit. 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[place])    Past Surgical History:  Procedure Laterality Date   BONE RESECTION  01/2006   wrist   GASTRIC BYPASS  08/14/2009   OTHER SURGICAL HISTORY     sickle cell retinopathy laser surgery   TUBAL LIGATION  04/28/1977    FAMILY HISTORY Family History  Problem Relation Age of Onset   Diabetes  Mother    Heart disease Mother    Hypertension Mother    Stroke Mother    Diabetes Father    Heart disease Father    Diabetes Sister    Diabetes Brother    Hypertension Brother    Sickle cell anemia Brother    Diabetes Maternal Aunt    Diabetes Maternal Uncle    Diabetes Maternal Grandmother    Diabetes Maternal Grandfather    Sickle cell anemia Sister    Sickle cell anemia Sister    Healthy Son    Healthy Son    Healthy Daughter     SOCIAL HISTORY Social History   Tobacco Use   Smoking status: Former    Packs/day: 0.25    Years: 43.00    Pack years: 10.75    Types: Cigarettes    Start date: 04/1962    Quit date: 04/2005    Years since quitting: 16.3   Smokeless tobacco: Never  Vaping Use   Vaping Use: Never used  Substance Use Topics   Alcohol use: Not Currently    Comment: occasionally   Drug use: No         OPHTHALMIC EXAM:  Base Eye Exam     Visual Acuity (ETDRS)       Right Left   Dist [place] 20/400 20/400   Dist ph [place] NI 20/200         Tonometry (Tonopen, 1:56 PM)       Right Left   Pressure 25 15         Pupils       Dark Light Shape React APD   Right 4 3 Round  None   Left 5 5 Round Minimal None  Pt dilated at Dr. Kellie Moor office this morning.        Visual Fields (Counting fingers)       Left Right   Restrictions Partial outer inferior nasal deficiency Partial outer superior temporal, inferior temporal, inferior nasal deficiencies         Extraocular Movement       Right Left    Full Full         Neuro/Psych     Oriented x3: Yes   Mood/Affect: Normal         Dilation     Both eyes: 1.0% Mydriacyl, 2.5% Phenylephrine @ 1:56 PM  Pt was also dilated at Dr. Kellie Moor office this morning          Slit Lamp and Fundus Exam     External Exam       Right Left   External Normal Normal  Slit Lamp Exam       Right Left   Lids/Lashes Normal Normal   Conjunctiva/Sclera White and quiet White and  quiet   Cornea Clear Clear   Anterior Chamber Deep and quiet Deep and quiet   Iris Round and reactive Round and reactive   Lens Centered posterior chamber intraocular lens Centered posterior chamber intraocular lens   Anterior Vitreous Normal Normal         Fundus Exam       Right Left   Posterior Vitreous Vitreous hemorrhage, central in the visual axis Clear media   Disc No view Normal   C/D Ratio No details 0.55   Macula No details Normal   Vessels Appear normal peripherally Normal   Periphery Periphery attached Large superotemporal chorioretinal scar, retina attached            IMAGING AND PROCEDURES  Imaging and Procedures for _0 @  OCT, Retina - OU - Both Eyes       Right Eye Progression has no prior data.   Left Eye Central Foveal Thickness: 220. Progression has no prior data.   Notes Medial opacity OD prevents evaluation of macula OD  OS diffuse macular atrophy     Color Fundus Photography Optos - OU - Both Eyes       Right Eye Progression has no prior data.   Left Eye Progression has no prior data. Disc findings include increased cup to disc ratio.   Notes Dense central visual axis hemorrhage retinal periphery OD is a attached  OS large chorioretinal scar temporally, clear media     B-Scan Ultrasound - OD - Right Eye       Quality was good. Findings included subhyaloid opacities, vitreous hemorrhage.   Notes No retinal detachment, no choroidal hemorrhages, no retinal hemorrhages, no tears  Dense central vitreous opacity, hemorrhage             ASSESSMENT/PLAN:    ICD-10-CM   1. Vitreous hemorrhage of right eye (HCC)  H43.11 Color Fundus Photography Optos - OU - Both Eyes    B-Scan Ultrasound - OD - Right Eye    2. Posterior vitreous detachment of left eye  H43.812 OCT, Retina - OU - Both Eyes      Ophthalmic Meds Ordered this visit:  No orders of the defined types were placed in this encounter.       Pre-op  completed. Operative consent obtained with pre-op eye drops reviewed with Sandy Salaam and sent via [facility] as needed. Post op instructions reviewed with patient and per patient all questions answered.  [place], COA

## 2021-08-07 ENCOUNTER — Ambulatory Visit: Payer: Medicare Other | Admitting: Podiatry

## 2021-08-07 NOTE — Chronic Care Management (AMB) (Signed)
Care Management   Note  08/07/2021 Name: Miranda Roman MRN: [id number] DOB: 05/17/1946  Miranda Roman is a 76 y.o. year old female who is a primary care patient of Glendale Chard, MD and is actively engaged with the care management team. I reached out to Sandy Salaam by phone today to assist with re-scheduling an initial visit with the BSW  Follow up plan: Unsuccessful telephone outreach attempt made. The care management team will reach out to the patient again over the next 7 days. If patient returns call to provider office, please advise to call [place] at [phone].  [facility]  Direct Dial: [phone]

## 2021-08-14 ENCOUNTER — Encounter: Payer: Self-pay | Admitting: Internal Medicine

## 2021-08-14 ENCOUNTER — Encounter (AMBULATORY_SURGERY_CENTER): Payer: Medicare Other | Admitting: Ophthalmology

## 2021-08-14 DIAGNOSIS — H33321 Round hole, right eye: Secondary | ICD-10-CM

## 2021-08-14 DIAGNOSIS — H4311 Vitreous hemorrhage, right eye: Secondary | ICD-10-CM

## 2021-08-14 DIAGNOSIS — H43391 Other vitreous opacities, right eye: Secondary | ICD-10-CM | POA: Diagnosis not present

## 2021-08-14 NOTE — Chronic Care Management (AMB) (Signed)
Care Management   Note  08/14/2021 Name: ASHER BABILONIA MRN: [id number] DOB: 01-02-1946  SILVINA HACKLEMAN is a 76 y.o. year old female who is a primary care patient of Glendale Chard, MD and is actively engaged with the care management team. I reached out to Sandy Salaam by phone today to assist with re-scheduling an initial visit with the BSW  Follow up plan: A third unsuccessful telephone outreach attempt made. Unable to make contact on outreach attempts x 3. PCP Dr. Baird Cancer notified via routed documentation in medical record. We have been unable to make contact with the patient for follow up. The care management team is available to follow up with the patient after provider conversation with the patient regarding recommendation for care management engagement and subsequent re-referral to the care management team.   [place] Management  Direct Dial: [phone]

## 2021-08-15 ENCOUNTER — Ambulatory Visit (INDEPENDENT_AMBULATORY_CARE_PROVIDER_SITE_OTHER): Payer: Medicare Other | Admitting: Ophthalmology

## 2021-08-15 ENCOUNTER — Ambulatory Visit (INDEPENDENT_AMBULATORY_CARE_PROVIDER_SITE_OTHER): Payer: Medicare Other | Admitting: Internal Medicine

## 2021-08-15 ENCOUNTER — Other Ambulatory Visit: Payer: Self-pay

## 2021-08-15 ENCOUNTER — Encounter (INDEPENDENT_AMBULATORY_CARE_PROVIDER_SITE_OTHER): Payer: Self-pay | Admitting: Ophthalmology

## 2021-08-15 ENCOUNTER — Encounter: Payer: Self-pay | Admitting: Internal Medicine

## 2021-08-15 VITALS — BP 122/80 | HR 72 | Temp 98.1°F | Ht 61.8 in | Wt 158.8 lb

## 2021-08-15 DIAGNOSIS — R350 Frequency of micturition: Secondary | ICD-10-CM

## 2021-08-15 DIAGNOSIS — H4311 Vitreous hemorrhage, right eye: Secondary | ICD-10-CM

## 2021-08-15 DIAGNOSIS — N390 Urinary tract infection, site not specified: Secondary | ICD-10-CM

## 2021-08-15 DIAGNOSIS — E1122 Type 2 diabetes mellitus with diabetic chronic kidney disease: Secondary | ICD-10-CM | POA: Diagnosis not present

## 2021-08-15 DIAGNOSIS — I739 Peripheral vascular disease, unspecified: Secondary | ICD-10-CM | POA: Diagnosis not present

## 2021-08-15 DIAGNOSIS — H1131 Conjunctival hemorrhage, right eye: Secondary | ICD-10-CM | POA: Diagnosis not present

## 2021-08-15 DIAGNOSIS — J479 Bronchiectasis, uncomplicated: Secondary | ICD-10-CM

## 2021-08-15 DIAGNOSIS — N184 Chronic kidney disease, stage 4 (severe): Secondary | ICD-10-CM | POA: Diagnosis not present

## 2021-08-15 MED ORDER — DOXYCYCLINE HYCLATE 100 MG PO TABS: ORAL_TABLET | ORAL | 0 refills | Status: DC

## 2021-08-15 NOTE — Progress Notes (Signed)
08/15/2021     CHIEF COMPLAINT Patient presents for  Chief Complaint  Patient presents with   Post-op Follow-up      HISTORY OF PRESENT ILLNESS: Miranda Roman is a 76 y.o. female who presents to the clinic today for:   HPI     Post-op Follow-up           Laterality: right eye         Comments   Postop day #1 status post vitrectomy, panretinal photocoagulation for dense nonclearing vitreous hemorrhage  At the time of surgery refractile particulate old material discovered in the vitreous cavity possible cholesterolosis or possible old hemorrhage.      Last edited by Hurman Horn, MD on 08/15/2021  8:32 AM.      Referring physician: Glendale Chard, MD [address] [place],  [place] [address]  HISTORICAL INFORMATION:   Selected notes from the MEDICAL RECORD NUMBER    Lab Results  Component Value Date   HGBA1C 5.1 05/16/2021     CURRENT MEDICATIONS: No current outpatient medications on file. (Ophthalmic Drugs)   No current facility-administered medications for this visit. (Ophthalmic Drugs)   Current Outpatient Medications (Other)  Medication Sig   acetaminophen (TYLENOL) 500 MG tablet Take 1 tablet (500 mg total) by mouth every 6 (six) hours as needed for moderate pain or fever.   amLODipine (NORVASC) 2.5 MG tablet Take 2.5 mg by mouth daily.   atorvastatin (LIPITOR) 20 MG tablet TAKE ONE TABLET BY MOUTH M-F AND SKIP WEEKENDS   AZO-CRANBERRY PO Take 1 capsule by mouth daily at 6 (six) AM.   b complex vitamins capsule Take 1 capsule by mouth daily.   BD INSULIN SYRINGE U/F 31G X 5/16" 0.3 ML MISC See admin instructions. use with insulin   calcitRIOL (ROCALTROL) 0.25 MCG capsule Take 0.25 mcg by mouth daily.   cetirizine (ZYRTEC) 10 MG tablet Take 10 mg by mouth daily.   Cyanocobalamin (VITAMIN B12) 1000 MCG TBCR 1 tablet   diclofenac Sodium (VOLTAREN) 1 % GEL Apply 2 g topically 4 (four) times daily. Apply to left knee.   epoetin alfa  (PROCRIT) 49702 UNIT/ML injection Every other week   estradiol (ESTRACE) 0.1 MG/GM vaginal cream    gabapentin (NEURONTIN) 100 MG capsule One capsule po qhs   HUMALOG 100 UNIT/ML injection INJECT 4 UNIT(S) SUBCUTANEOUSLY 3 TIMES DAILY (Patient taking differently: once. INJECT 4 UNIT(S) SUBCUTANEOUSLY 3 TIMES DAILY)   insulin detemir (LEVEMIR FLEXTOUCH) 100 UNIT/ML FlexPen 6 Units daily.   Insulin Pen Needle (BD PEN NEEDLE NANO U/F) 32G X 4 MM MISC Use as directed with insulin pen   metoprolol tartrate (LOPRESSOR) 50 MG tablet TAKE 1 TABLET BY MOUTH TWICE A DAY   Multiple Vitamins-Minerals (CENTRUM SILVER PO) Take by mouth daily.   OneTouch Delica Lancets 63Z MISC See admin instructions.   sodium bicarbonate 650 MG tablet Take 650 mg by mouth 2 (two) times daily.   No current facility-administered medications for this visit. (Other)      REVIEW OF SYSTEMS: ROS   Negative for: Constitutional, Gastrointestinal, Neurological, Skin, Genitourinary, Musculoskeletal, HENT, Endocrine, Cardiovascular, Eyes, Respiratory, Psychiatric, Allergic/Imm, Heme/Lymph Last edited by Hurman Horn, MD on 08/15/2021  8:32 AM.       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[place])    Past Surgical History:  Procedure Laterality Date   BONE RESECTION  01/2006   wrist   GASTRIC BYPASS  08/14/2009   OTHER SURGICAL HISTORY     sickle cell retinopathy laser surgery   TUBAL LIGATION  04/28/1977    FAMILY HISTORY Family History  Problem Relation Age of Onset   Diabetes Mother    Heart disease Mother    Hypertension Mother     Stroke Mother    Diabetes Father    Heart disease Father    Diabetes Sister    Diabetes Brother    Hypertension Brother    Sickle cell anemia Brother    Diabetes Maternal Aunt    Diabetes Maternal Uncle    Diabetes Maternal Grandmother    Diabetes Maternal Grandfather    Sickle cell anemia Sister    Sickle cell anemia Sister    Healthy Son    Healthy Son    Healthy Daughter     SOCIAL HISTORY Social History   Tobacco Use   Smoking status: Former    Packs/day: 0.25    Years: 43.00    Pack years: 10.75    Types: Cigarettes    Start date: 04/1962    Quit date: 04/2005    Years since quitting: 16.3   Smokeless tobacco: Never  Vaping Use   Vaping Use: Never used  Substance Use Topics   Alcohol use: Not Currently    Comment: occasionally   Drug use: No         OPHTHALMIC EXAM:  Base Eye Exam     Visual Acuity (ETDRS)       Right Left   Dist [place] 20/80 20/80   Dist ph [place] NI          Tonometry (Tonopen, 8:32 AM)       Right Left   Pressure 15 10           Slit Lamp and Fundus Exam     Slit Lamp Exam       Right Left   Lids/Lashes Normal Normal   Conjunctiva/Sclera 1+ Subconjunctival hemorrhage White and quiet   Cornea Clear Clear   Anterior Chamber Deep and quiet Deep and quiet   Iris Pharmacologically dilated Round and reactive   Lens Centered posterior chamber intraocular lens Centered posterior chamber intraocular lens   Anterior Vitreous Normal Normal         Fundus Exam       Right Left   Posterior Vitreous Clear, avitric    Disc Normal    C/D Ratio 0.3    Macula Macula attached    Vessels Good peripheral PRP, no active NVE    Periphery Good peripheral PRP, no active NVE, retina attached             IMAGING AND PROCEDURES  Imaging and Procedures for 08/15/21           ASSESSMENT/PLAN:  Vitreous hemorrhage of right eye ([place]) Postop day #1 looks great, clear media, retina attached.  Good PRP     ICD-10-CM   1.  Vitreous hemorrhage of right eye (HCC)  H43.11       1.  Postop day #1 vitreous  hemorrhage status post vitrectomy PRP findings at time of surgery included no's active signs of NVE.  Retinal vasculature appeared intact.  2.  3.  Ophthalmic Meds Ordered this visit:  No orders of the defined types were placed in this encounter.      Return in about 1 week (around 08/22/2021) for dilate, OD, POST OP, OCT, COLOR FP.  Patient Instructions  Ofloxacin  4 times daily to the operative eye  Prednisolone acetate 1 drop to the operative eye 4 times daily  Patient instructed not to refill the medications and use them for maximum of 3 weeks.  Patient instructed do not rub the eye.  Patient has the option to use the patch at night.   Restart topical medications to the right eye today  No lifting and bending for 1 week. No water IN the eye for 10 days. Do not rub the eye. Wear shield at night for 1-3 days.  Continue your topical medications for a total of 3 weeks.  Do not refill your postoperative medications unless instructed.  Refrain from exercise or intentional activity which increases our heart rate above resting levels.  Normal walking to complete normal activities of your day are appropriate.  Driving:  Legally, you only need one good eye, of 20/40 or better to drive.  However, the practice does not recommend driving during first weeks after surgery, IF you are uncomfortable with your visual functioning or capabilities.   If you have known sleep apnea, wear your CPAP as you normally should.    Explained the diagnoses, plan, and follow up with the patient and they expressed understanding.  Patient expressed understanding of the importance of proper follow up care.   Clent Demark Olson Lucarelli M.D. Diseases & Surgery of the Retina and Vitreous [facility] [place] 08/15/21     Abbreviations: M myopia (nearsighted); A astigmatism; H hyperopia (farsighted); P presbyopia; Mrx spectacle  prescription;  CTL contact lenses; OD right eye; OS left eye; OU both eyes  XT exotropia; ET esotropia; PEK punctate epithelial keratitis; PEE punctate epithelial erosions; DES dry eye syndrome; MGD meibomian gland dysfunction; ATs artificial tears; PFAT's preservative free artificial tears; [place] nuclear sclerotic cataract; PSC posterior subcapsular cataract; ERM epi-retinal membrane; PVD posterior vitreous detachment; RD retinal detachment; DM diabetes mellitus; DR diabetic retinopathy; NPDR non-proliferative diabetic retinopathy; PDR proliferative diabetic retinopathy; CSME clinically significant macular edema; DME diabetic macular edema; dbh dot blot hemorrhages; CWS cotton wool spot; POAG primary open angle glaucoma; C/D cup-to-disc ratio; HVF humphrey visual field; GVF goldmann visual field; OCT optical coherence tomography; IOP intraocular pressure; BRVO Branch retinal vein occlusion; CRVO central retinal vein occlusion; CRAO central retinal artery occlusion; BRAO branch retinal artery occlusion; RT retinal tear; SB scleral buckle; PPV pars plana vitrectomy; VH Vitreous hemorrhage; PRP panretinal laser photocoagulation; IVK intravitreal kenalog; VMT vitreomacular traction; MH Macular hole;  NVD neovascularization of the disc; NVE neovascularization elsewhere; AREDS age related eye disease study; ARMD age related macular degeneration; POAG primary open angle glaucoma; EBMD epithelial/anterior basement membrane dystrophy; ACIOL anterior chamber intraocular lens; IOL intraocular lens; PCIOL posterior chamber intraocular lens; Phaco/IOL phacoemulsification with intraocular lens placement; [place] photorefractive keratectomy; LASIK laser assisted in situ keratomileusis; HTN hypertension; DM diabetes mellitus; COPD chronic obstructive pulmonary disease

## 2021-08-15 NOTE — Patient Instructions (Signed)
Urinary Tract Infection, Adult A urinary tract infection (UTI) is an infection of any part of the urinary tract. The urinary tract includes: The kidneys. The ureters. The bladder. The urethra. These organs make, store, and get rid of pee (urine) in the body. What are the causes? This infection is caused by germs (bacteria) in your genital area. These germs grow and cause swelling (inflammation) of your urinary tract. What increases the risk? The following factors may make you more likely to develop this condition: Using a small, thin tube (catheter) to drain pee. Not being able to control when you pee or poop (incontinence). Being female. If you are female, these things can increase the risk: Using these methods to prevent pregnancy: A medicine that kills sperm (spermicide). A device that blocks sperm (diaphragm). Having low levels of a female hormone (estrogen). Being pregnant. You are more likely to develop this condition if: You have genes that add to your risk. You are sexually active. You take antibiotic medicines. You have trouble peeing because of: A prostate that is bigger than normal, if you are female. A blockage in the part of your body that drains pee from the bladder. A kidney stone. A nerve condition that affects your bladder. Not getting enough to drink. Not peeing often enough. You have other conditions, such as: Diabetes. A weak disease-fighting system (immune system). Sickle cell disease. Gout. Injury of the spine. What are the signs or symptoms? Symptoms of this condition include: Needing to pee right away. Peeing small amounts often. Pain or burning when peeing. Blood in the pee. Pee that smells bad or not like normal. Trouble peeing. Pee that is cloudy. Fluid coming from the vagina, if you are female. Pain in the belly or lower back. Other symptoms include: Vomiting. Not feeling hungry. Feeling mixed up (confused). This may be the first symptom in  older adults. Being tired and grouchy (irritable). A fever. Watery poop (diarrhea). How is this treated? Taking antibiotic medicine. Taking other medicines. Drinking enough water. In some cases, you may need to see a specialist. Follow these instructions at home: Medicines Take over-the-counter and prescription medicines only as told by your doctor. If you were prescribed an antibiotic medicine, take it as told by your doctor. Do not stop taking it even if you start to feel better. General instructions Make sure you: Pee until your bladder is empty. Do not hold pee for a long time. Empty your bladder after sex. Wipe from front to back after peeing or pooping if you are a female. Use each tissue one time when you wipe. Drink enough fluid to keep your pee pale yellow. Keep all follow-up visits. Contact a doctor if: You do not get better after 1-2 days. Your symptoms go away and then come back. Get help right away if: You have very bad back pain. You have very bad pain in your lower belly. You have a fever. You have chills. You feeling like you will vomit or you vomit. Summary A urinary tract infection (UTI) is an infection of any part of the urinary tract. This condition is caused by germs in your genital area. There are many risk factors for a UTI. Treatment includes antibiotic medicines. Drink enough fluid to keep your pee pale yellow. This information is not intended to replace advice given to you by your health care provider. Make sure you discuss any questions you have with your health care provider. Document Revised: 02/03/2020 Document Reviewed: 02/03/2020 Elsevier Patient Education  2022  [employer].

## 2021-08-15 NOTE — Assessment & Plan Note (Signed)
Postop day #1 looks great, clear media, retina attached.  Good PRP

## 2021-08-15 NOTE — Progress Notes (Signed)
Rich Brave [place],acting as a Education administrator for [name], MD.,have documented all relevant documentation on the behalf of [name], MD,as directed by  [name], MD while in the presence of [name], MD.  This visit occurred during the SARS-CoV-2 public health emergency.  Safety protocols were in place, including screening questions prior to the visit, additional usage of staff PPE, and extensive cleaning of exam room while observing appropriate contact time as indicated for disinfecting solutions.  Subjective:     Patient ID: Miranda Roman , female    DOB: 1945-08-23 , 76 y.o.   MRN: [id number]   Chief Complaint  Patient presents with   Urinary Tract Infection   Diabetes   Hypertension    HPI  Patient presents today for a uti. Patient's daughter in law did a at home UTI test and it came back positive. She is accompanied by her son today.  She does admit to urinary frequency, no dysuria.   Urinary Tract Infection  This is a new problem. The current episode started 1 to 4 weeks ago. The problem occurs every urination. The problem has been unchanged. The quality of the pain is described as burning. The pain is moderate. There has been no fever. Associated symptoms include frequency and urgency. Pertinent negatives include no discharge, flank pain, hematuria or hesitancy. She has tried nothing for the symptoms.  Diabetes She presents for her follow-up diabetic visit. She has type 2 diabetes mellitus. Her disease course has been stable. There are no hypoglycemic associated symptoms. Pertinent negatives for hypoglycemia include no dizziness. There are no diabetic associated symptoms. Pertinent negatives for diabetes include no polydipsia, no polyphagia, no polyuria and no weakness. There are no hypoglycemic complications. Diabetic complications include nephropathy. Risk factors for coronary artery disease include diabetes mellitus, dyslipidemia, hypertension, sedentary  lifestyle and post-menopausal. Current diabetic treatment includes insulin injections. She is compliant with treatment most of the time. She is following a diabetic diet. Meal planning includes avoidance of concentrated sweets.    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[place])      Family History  Problem Relation Age of Onset   Diabetes Mother    Heart disease Mother    Hypertension Mother    Stroke Mother    Diabetes Father    Heart disease Father    Diabetes Sister    Diabetes Brother    Hypertension Brother    Sickle cell anemia Brother    Diabetes Maternal Aunt    Diabetes Maternal Uncle    Diabetes Maternal Grandmother    Diabetes Maternal Grandfather    Sickle cell anemia Sister    Sickle cell anemia Sister    Healthy Son    Healthy Son    Healthy Daughter      Current Outpatient Medications:    acetaminophen (TYLENOL) 500 MG tablet, Take 1 tablet (500 mg total) by mouth every 6 (six) hours as needed for moderate pain or fever., Disp: 30 tablet, Rfl: 0   amLODipine (NORVASC) 2.5 MG tablet, Take 2.5 mg by mouth daily., Disp: , Rfl:    atorvastatin (LIPITOR) 20 MG tablet, TAKE ONE TABLET BY MOUTH M-F AND SKIP WEEKENDS, Disp: 75 tablet, Rfl: 1   AZO-CRANBERRY PO, Take 1 capsule by  mouth daily at 6 (six) AM., Disp: , Rfl:    b complex vitamins capsule, Take 1 capsule by mouth daily., Disp: , Rfl:    BD INSULIN SYRINGE U/F 31G X 5/16" 0.3 ML MISC, See admin instructions. use with insulin, Disp: , Rfl:    calcitRIOL (ROCALTROL) 0.25 MCG capsule, Take 0.25 mcg by mouth daily., Disp: , Rfl:    cetirizine (ZYRTEC) 10 MG tablet, Take 10 mg by mouth daily., Disp: , Rfl:    Cyanocobalamin (VITAMIN B12) 1000 MCG TBCR, 1 tablet, Disp: , Rfl:    diclofenac Sodium (VOLTAREN) 1 % GEL, Apply 2 g topically 4 (four) times daily.  Apply to left knee., Disp: 50 g, Rfl: 0   doxycycline (VIBRA-TABS) 100 MG tablet, One  tab po q12 hours on day 1, then one tab po qd, Disp: 7 tablet, Rfl: 0   epoetin alfa (PROCRIT) 93818 UNIT/ML injection, Every other week, Disp: , Rfl:    estradiol (ESTRACE) 0.1 MG/GM vaginal cream, , Disp: , Rfl:    gabapentin (NEURONTIN) 100 MG capsule, One capsule po qhs, Disp: 90 capsule, Rfl: 2   HUMALOG 100 UNIT/ML injection, INJECT 4 UNIT(S) SUBCUTANEOUSLY 3 TIMES DAILY (Patient taking differently: once. INJECT 4 UNIT(S) SUBCUTANEOUSLY 3 TIMES DAILY), Disp: 10 mL, Rfl: 1   insulin detemir (LEVEMIR FLEXTOUCH) 100 UNIT/ML FlexPen, 6 Units daily., Disp: , Rfl:    Insulin Pen Needle (BD PEN NEEDLE NANO U/F) 32G X 4 MM MISC, Use as directed with insulin pen, Disp: 150 each, Rfl: 2   ketorolac (ACULAR) 0.5 % ophthalmic solution, ketorolac 0.5 % eye drops, Disp: , Rfl:    metoprolol tartrate (LOPRESSOR) 50 MG tablet, TAKE 1 TABLET BY MOUTH TWICE A DAY, Disp: 180 tablet, Rfl: 1   Multiple Vitamins-Minerals (CENTRUM SILVER PO), Take by mouth daily., Disp: , Rfl:    ofloxacin (OCUFLOX) 0.3 % ophthalmic solution, ofloxacin 0.3 % eye drops, Disp: , Rfl:    OneTouch Delica Lancets 29H MISC, See admin instructions., Disp: , Rfl:    sodium bicarbonate 650 MG tablet, Take 650 mg by mouth 2 (two) times daily., Disp: , Rfl:    Allergies  Allergen Reactions   Cefepime Other (See Comments)    Causes encephalitis - reported by [facility] 12/31/2019   Lactose Intolerance (Gi)     Per pt's daughter causes gas     Review of Systems  Constitutional: Negative.   Respiratory: Negative.    Cardiovascular: Negative.   Gastrointestinal: Negative.   Endocrine: Negative for polydipsia, polyphagia and polyuria.  Genitourinary:  Positive for frequency and urgency. Negative for flank pain, hematuria and hesitancy.  Neurological: Negative.  Negative for dizziness and weakness.  Psychiatric/Behavioral: Negative.      Today's  Vitals   08/15/21 1533  BP: 122/80  Pulse: 72  Temp: 98.1 F (36.7 C)  Weight: 158 lb 12.8 oz (72 kg)  Height: 5' 1.8" (1.57 m)  PainSc: 0-No pain   Body mass index is 29.23 kg/m.  Wt Readings from Last 3 Encounters:  08/23/21 158 lb 11.7 oz (72 kg)  08/15/21 158 lb 12.8 oz (72 kg)  07/26/21 162 lb (73.5 kg)     Objective:  Physical Exam Vitals and nursing note reviewed.  Constitutional:      Appearance: Normal appearance.  HENT:     Head: Normocephalic and atraumatic.     Nose:     Comments: Masked     Mouth/Throat:     Comments: Masked  Eyes:  Extraocular Movements: Extraocular movements intact.     Conjunctiva/sclera:     Right eye: Hemorrhage present.  Cardiovascular:     Rate and Rhythm: Normal rate and regular rhythm.     Heart sounds: Normal heart sounds.  Pulmonary:     Effort: Pulmonary effort is normal.     Breath sounds: Normal breath sounds.  Musculoskeletal:     Cervical back: Normal range of motion.  Skin:    General: Skin is warm.  Neurological:     General: No focal deficit present.     Mental Status: She is alert.  Psychiatric:        Mood and Affect: Mood normal.        Behavior: Behavior normal.        Assessment And Plan:     1. Urinary frequency Comments: She is unable to provide specimen. Advised to drink 4 oz cranberry juice. Rx doxycycline sent to pharmacy, encouraged to complete full course.   2. Subconjunctival hemorrhage of right eye Comments: She is s/p recent ocular surgery. She is status post vitrectomy, panretinal photocoagulation for dense nonclearing vitreous hemorrhage.  She will contact her ophthalmologist if she develops visual disturbance.   3. Diabetes mellitus with stage 4 chronic kidney disease ([place]) Comments: Chronic, I will check labs as below. I will adjust meds as needed. Importance of dietary/medication compliance was d/w patient.  - CMP14+EGFR - Hemoglobin A1c  4. PAD (peripheral artery disease)  (HCC) Comments: Chronic, encouraged to c/w statin and to increase daily activity as tolerated .   Patient was given opportunity to ask questions. Patient verbalized understanding of the plan and was able to repeat key elements of the plan. All questions were answered to their satisfaction.   I, [name], MD, have reviewed all documentation for this visit. The documentation on 08/25/21 for the exam, diagnosis, procedures, and orders are all accurate and complete.   IF YOU HAVE BEEN REFERRED TO A SPECIALIST, IT MAY TAKE 1-2 WEEKS TO SCHEDULE/PROCESS THE REFERRAL. IF YOU HAVE NOT HEARD FROM US/SPECIALIST IN TWO WEEKS, PLEASE GIVE [place] A CALL AT [phone].   THE PATIENT IS ENCOURAGED TO PRACTICE SOCIAL DISTANCING DUE TO THE COVID-19 PANDEMIC.

## 2021-08-15 NOTE — Patient Instructions (Signed)
Ofloxacin  4 times daily to the operative eye  Prednisolone acetate 1 drop to the operative eye 4 times daily  Patient instructed not to refill the medications and use them for maximum of 3 weeks.  Patient instructed do not rub the eye.  Patient has the option to use the patch at night.   Restart topical medications to the right eye today  No lifting and bending for 1 week. No water IN the eye for 10 days. Do not rub the eye. Wear shield at night for 1-3 days.  Continue your topical medications for a total of 3 weeks.  Do not refill your postoperative medications unless instructed.  Refrain from exercise or intentional activity which increases our heart rate above resting levels.  Normal walking to complete normal activities of your day are appropriate.  Driving:  Legally, you only need one good eye, of 20/40 or better to drive.  However, the practice does not recommend driving during first weeks after surgery, IF you are uncomfortable with your visual functioning or capabilities.   If you have known sleep apnea, wear your CPAP as you normally should.

## 2021-08-16 LAB — CMP14+EGFR
ALT: 36 IU/L — ABNORMAL HIGH (ref 0–32)
AST: 32 IU/L (ref 0–40)
Albumin/Globulin Ratio: 2 (ref 1.2–2.2)
Albumin: 4.3 g/dL (ref 3.7–4.7)
Alkaline Phosphatase: 97 IU/L (ref 44–121)
BUN/Creatinine Ratio: 16 (ref 12–28)
BUN: 41 mg/dL — ABNORMAL HIGH (ref 8–27)
Bilirubin Total: 0.4 mg/dL (ref 0.0–1.2)
CO2: 19 mmol/L — ABNORMAL LOW (ref 20–29)
Calcium: 8.7 mg/dL (ref 8.7–10.3)
Chloride: 107 mmol/L — ABNORMAL HIGH (ref 96–106)
Creatinine, Ser: 2.59 mg/dL — ABNORMAL HIGH (ref 0.57–1.00)
Globulin, Total: 2.2 g/dL (ref 1.5–4.5)
Glucose: 72 mg/dL (ref 70–99)
Potassium: 4.5 mmol/L (ref 3.5–5.2)
Sodium: 142 mmol/L (ref 134–144)
Total Protein: 6.5 g/dL (ref 6.0–8.5)
eGFR: 19 mL/min/{1.73_m2} — ABNORMAL LOW (ref >59)

## 2021-08-16 LAB — HEMOGLOBIN A1C
Est. average glucose Bld gHb Est-mCnc: 111 mg/dL
Hgb A1c MFr Bld: 5.5 % (ref 4.8–5.6)

## 2021-08-21 ENCOUNTER — Encounter (INDEPENDENT_AMBULATORY_CARE_PROVIDER_SITE_OTHER): Payer: Medicare Other | Admitting: Ophthalmology

## 2021-08-22 ENCOUNTER — Encounter (INDEPENDENT_AMBULATORY_CARE_PROVIDER_SITE_OTHER): Payer: Self-pay | Admitting: Ophthalmology

## 2021-08-22 ENCOUNTER — Other Ambulatory Visit: Payer: Self-pay

## 2021-08-22 ENCOUNTER — Ambulatory Visit (INDEPENDENT_AMBULATORY_CARE_PROVIDER_SITE_OTHER): Payer: Medicare Other | Admitting: Ophthalmology

## 2021-08-22 ENCOUNTER — Ambulatory Visit: Payer: Medicare Other | Admitting: Internal Medicine

## 2021-08-22 DIAGNOSIS — I129 Hypertensive chronic kidney disease with stage 1 through stage 4 chronic kidney disease, or unspecified chronic kidney disease: Secondary | ICD-10-CM | POA: Diagnosis not present

## 2021-08-22 DIAGNOSIS — I2723 Pulmonary hypertension due to lung diseases and hypoxia: Secondary | ICD-10-CM | POA: Diagnosis not present

## 2021-08-22 DIAGNOSIS — E1122 Type 2 diabetes mellitus with diabetic chronic kidney disease: Secondary | ICD-10-CM | POA: Diagnosis not present

## 2021-08-22 DIAGNOSIS — H40041 Steroid responder, right eye: Secondary | ICD-10-CM | POA: Diagnosis not present

## 2021-08-22 DIAGNOSIS — Z794 Long term (current) use of insulin: Secondary | ICD-10-CM | POA: Diagnosis not present

## 2021-08-22 DIAGNOSIS — E113591 Type 2 diabetes mellitus with proliferative diabetic retinopathy without macular edema, right eye: Secondary | ICD-10-CM | POA: Insufficient documentation

## 2021-08-22 DIAGNOSIS — D631 Anemia in chronic kidney disease: Secondary | ICD-10-CM | POA: Diagnosis not present

## 2021-08-22 DIAGNOSIS — H3589 Other specified retinal disorders: Secondary | ICD-10-CM | POA: Insufficient documentation

## 2021-08-22 DIAGNOSIS — M810 Age-related osteoporosis without current pathological fracture: Secondary | ICD-10-CM | POA: Diagnosis not present

## 2021-08-22 DIAGNOSIS — E785 Hyperlipidemia, unspecified: Secondary | ICD-10-CM | POA: Diagnosis not present

## 2021-08-22 DIAGNOSIS — H4311 Vitreous hemorrhage, right eye: Secondary | ICD-10-CM

## 2021-08-22 DIAGNOSIS — I10 Essential (primary) hypertension: Secondary | ICD-10-CM | POA: Diagnosis not present

## 2021-08-22 DIAGNOSIS — R2689 Other abnormalities of gait and mobility: Secondary | ICD-10-CM | POA: Diagnosis not present

## 2021-08-22 DIAGNOSIS — Z9181 History of falling: Secondary | ICD-10-CM | POA: Diagnosis not present

## 2021-08-22 DIAGNOSIS — N184 Chronic kidney disease, stage 4 (severe): Secondary | ICD-10-CM | POA: Diagnosis not present

## 2021-08-22 HISTORY — DX: Other specified retinal disorders: H35.89

## 2021-08-22 HISTORY — DX: Steroid responder, right eye: H40.041

## 2021-08-22 HISTORY — DX: Type 2 diabetes mellitus with proliferative diabetic retinopathy without macular edema, right eye: E11.3591

## 2021-08-22 NOTE — Assessment & Plan Note (Signed)
OD, Early elevated intraocular pressure to 28 mm we will discontinue all topical medications from surgery in the right eye today

## 2021-08-22 NOTE — Patient Instructions (Addendum)
° °  Patient instructed not to refill the medications   Patient instructed do not rub the eye.  Patient has the option to use the patch at night.  Patient to discontinue use of the eyedrops today in the right eye from surgery as early elevated intraocular pressure could mean neuroid induced glaucoma

## 2021-08-22 NOTE — Assessment & Plan Note (Signed)
OD media clear now, underlying macular atrophy will limit acuity.  Overall improved however

## 2021-08-22 NOTE — Assessment & Plan Note (Signed)
See comments regarding OU possible Wernicke's encephalopathy associated diffuse macular atrophy OU.  I explained to the family that and the patient that there is no medical therapy that we can offer in the retinal field to recover damage brain tissue of which the retina is brain tissue, after a prolonged period of thiamine deficiency  Thus this may be her best attainable visual acuity given the underlying atrophy of the retina OU

## 2021-08-22 NOTE — Assessment & Plan Note (Signed)
Likely contributory cause of nonclearing vitreous hemorrhage in the right eye now status post PRP likely to resolve in 2 quiescent PDR, post vitrectomy PRP

## 2021-08-22 NOTE — Progress Notes (Signed)
08/22/2021     CHIEF COMPLAINT Patient presents for  Chief Complaint  Patient presents with   Post-op Follow-up     Follow-up of PRP vitrectomy right eye for dense nonclearing vitreous hemorrhage likely of diabetic eye disease origin  Acuity in each eye and made worse by underlying sickle cell trait, beta thalassemia, leading to diffuse macular atrophy OS on previous examinations HISTORY OF PRESENT ILLNESS: Miranda Roman is a 76 y.o. female who presents to the clinic today for:   HPI   1 week post op OD sx 08/14/2021, oct. Patient states vision is stable and unchanged since last visit. Denies any new floaters or FOL. Pt is using prednisolone OD QID, and ofloxacin OD QID. Pt reports she "sometimes" has been using the eye patch the past week, and has missed wearing it the last two nights. Pt reports her eye glasses are lost. Last edited by Laurin Coder on 08/22/2021  8:47 AM.      Referring physician: Rutherford Guys, [address] [place],  [place] [address]  HISTORICAL INFORMATION:   Selected notes from the MEDICAL RECORD NUMBER    Lab Results  Component Value Date   HGBA1C 5.5 08/15/2021     CURRENT MEDICATIONS: Current Outpatient Medications (Ophthalmic Drugs)  Medication Sig   ketorolac (ACULAR) 0.5 % ophthalmic solution ketorolac 0.5 % eye drops   ofloxacin (OCUFLOX) 0.3 % ophthalmic solution ofloxacin 0.3 % eye drops   No current facility-administered medications for this visit. (Ophthalmic Drugs)   Current Outpatient Medications (Other)  Medication Sig   acetaminophen (TYLENOL) 500 MG tablet Take 1 tablet (500 mg total) by mouth every 6 (six) hours as needed for moderate pain or fever.   amLODipine (NORVASC) 2.5 MG tablet Take 2.5 mg by mouth daily.   atorvastatin (LIPITOR) 20 MG tablet TAKE ONE TABLET BY MOUTH M-F AND SKIP WEEKENDS   AZO-CRANBERRY PO Take 1 capsule by mouth daily at 6 (six) AM.   b complex vitamins capsule Take 1 capsule by mouth  daily.   BD INSULIN SYRINGE U/F 31G X 5/16" 0.3 ML MISC See admin instructions. use with insulin   calcitRIOL (ROCALTROL) 0.25 MCG capsule Take 0.25 mcg by mouth daily.   cetirizine (ZYRTEC) 10 MG tablet Take 10 mg by mouth daily.   Cyanocobalamin (VITAMIN B12) 1000 MCG TBCR 1 tablet   diclofenac Sodium (VOLTAREN) 1 % GEL Apply 2 g topically 4 (four) times daily. Apply to left knee.   doxycycline (VIBRA-TABS) 100 MG tablet One  tab po q12 hours on day 1, then one tab po qd   epoetin alfa (PROCRIT) 16384 UNIT/ML injection Every other week   estradiol (ESTRACE) 0.1 MG/GM vaginal cream    gabapentin (NEURONTIN) 100 MG capsule One capsule po qhs   HUMALOG 100 UNIT/ML injection INJECT 4 UNIT(S) SUBCUTANEOUSLY 3 TIMES DAILY (Patient taking differently: once. INJECT 4 UNIT(S) SUBCUTANEOUSLY 3 TIMES DAILY)   insulin detemir (LEVEMIR FLEXTOUCH) 100 UNIT/ML FlexPen 6 Units daily.   Insulin Pen Needle (BD PEN NEEDLE NANO U/F) 32G X 4 MM MISC Use as directed with insulin pen   metoprolol tartrate (LOPRESSOR) 50 MG tablet TAKE 1 TABLET BY MOUTH TWICE A DAY   Multiple Vitamins-Minerals (CENTRUM SILVER PO) Take by mouth daily.   OneTouch Delica Lancets 53M MISC See admin instructions.   sodium bicarbonate 650 MG tablet Take 650 mg by mouth 2 (two) times daily.   No current facility-administered medications for this visit. 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[place])    Past Surgical History:  Procedure Laterality Date   BONE RESECTION  01/2006   wrist   GASTRIC BYPASS  08/14/2009    OTHER SURGICAL HISTORY     sickle cell retinopathy laser surgery   TUBAL LIGATION  04/28/1977    FAMILY HISTORY Family History  Problem Relation Age of Onset   Diabetes Mother    Heart disease Mother    Hypertension Mother    Stroke Mother    Diabetes Father    Heart disease Father    Diabetes Sister    Diabetes Brother    Hypertension Brother    Sickle cell anemia Brother    Diabetes Maternal Aunt    Diabetes Maternal Uncle    Diabetes Maternal Grandmother    Diabetes Maternal Grandfather    Sickle cell anemia Sister    Sickle cell anemia Sister    Healthy Son    Healthy Son    Healthy Daughter     SOCIAL HISTORY Social History   Tobacco Use   Smoking status: Former    Packs/day: 0.25    Years: 43.00    Pack years: 10.75    Types: Cigarettes    Start date: 04/1962    Quit date: 04/2005    Years since quitting: 16.3   Smokeless tobacco: Never  Vaping Use   Vaping Use: Never used  Substance Use Topics   Alcohol use: Not Currently    Comment: occasionally   Drug use: No         OPHTHALMIC EXAM:  Base Eye Exam     Visual Acuity (ETDRS)       Right Left   Dist [place] 20/100 -1 20/200   Dist ph [place] NI NI         Tonometry (Tonopen, 8:50 AM)       Right Left   Pressure 28 19         Tonometry #2 (Tonopen, 8:57 AM)       Right Left   Pressure Unable          Tonometry Comments   Pt squinting and closing eye and holding breath. I tried to check IOP multiple times and could not get a reading, 41 was the only result.        Pupils       Dark Light Shape React APD   Right 3 3  Minimal None   Left 3 3 Irregular Minimal          Extraocular Movement       Right Left    Full Full         Neuro/Psych     Oriented x3: Yes   Mood/Affect: Normal         Dilation     Right eye: 1.0% Mydriacyl, 2.5% Phenylephrine @ 8:50 AM           Slit Lamp and Fundus Exam  Slit Lamp Exam       Right Left   Lids/Lashes  Normal Normal   Conjunctiva/Sclera 1+ Subconjunctival hemorrhage White and quiet   Cornea Clear Clear   Anterior Chamber Deep and quiet Deep and quiet   [place] Pharmacologically dilated Round and reactive   Lens Centered posterior chamber intraocular lens Centered posterior chamber intraocular lens   Anterior Vitreous Normal Normal         Fundus Exam       Right Left   Posterior Vitreous Clear, avitric    Disc Normal    C/D Ratio 0.3    Macula Macula attached    Vessels Good peripheral PRP, no active NVE    Periphery Good peripheral PRP, no active NVE, retina attached             IMAGING AND PROCEDURES  Imaging and Procedures for 08/22/21  OCT, Retina - OU - Both Eyes       Right Eye Progression has improved.   Left Eye Central Foveal Thickness: 220. Progression has been stable.   Notes OD now with clear media, post vitrectomy for nonclearing vitreous hemorrhage, diffuse macular atrophy is present however, similar to the diffuse macular atrophy in the left eye.  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    OS diffuse macular atrophy, no change over time             ASSESSMENT/PLAN:  Vitreous hemorrhage of right eye ([place]) OD media clear now, underlying macular atrophy will limit acuity.  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  I explained to the family that and the patient  that there is no medical therapy that we can offer in the retinal field to recover damage brain tissue of which the retina is brain tissue, after a prolonged period of thiamine deficiency  Thus this may be her best attainable visual acuity given the underlying atrophy of the retina OU     ICD-10-CM   1. Vitreous hemorrhage of right eye ([place])  H43.11 OCT, Retina - OU - Both Eyes    2. Proliferative diabetic retinopathy of right eye without macular edema associated with type 2 diabetes mellitus ([place])  E11.3591     3. 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 Macular atrophy, retinal  H35.89       1.  OD is doing very well.  Clear media today.  Acuity limited at 2100 today apparently only on the basis of diffuse macular atrophy now visible on OCT evaluation.  No acute ischemia is noted.  Good PRP is noted peripherally OD now.  2.  Mild elevation intraocular pressure we will continue to monitor and yet however discontinue all topical therapy from surgical intervention, and follow-up to monitor for lower intraocular pressure in the next weeks  3.  Ophthalmic Meds Ordered this visit:  No orders of the defined types were placed in this encounter.      Return in about 3 weeks (around 09/12/2021)  for DILATE OU, COLOR FP, OCT.  Patient Instructions    Patient instructed not to refill the medications   Patient instructed do not rub the eye.  Patient has the option to use the patch at night.  Patient to discontinue use of the eyedrops today in the right eye from surgery as early elevated intraocular pressure could mean neuroid induced glaucoma     Explained the diagnoses, plan, and follow up with the patient and they expressed understanding.  Patient expressed understanding of the importance of proper follow up care.   Clent Demark Robin Pafford M.D. Diseases & Surgery of the Retina and Vitreous Retina & Diabetic [place] 08/22/21     Abbreviations: M myopia  (nearsighted); A astigmatism; H hyperopia (farsighted); P presbyopia; Mrx spectacle prescription;  CTL contact lenses; OD right eye; OS left eye; OU both eyes  XT exotropia; ET esotropia; PEK punctate epithelial keratitis; PEE punctate epithelial erosions; DES dry eye syndrome; MGD meibomian gland dysfunction; ATs artificial tears; PFAT's preservative free artificial tears; [place] nuclear sclerotic cataract; PSC posterior subcapsular cataract; ERM epi-retinal membrane; PVD posterior vitreous detachment; RD retinal detachment; DM diabetes mellitus; DR diabetic retinopathy; NPDR non-proliferative diabetic retinopathy; PDR proliferative diabetic retinopathy; CSME clinically significant macular edema; DME diabetic macular edema; dbh dot blot hemorrhages; CWS cotton wool spot; POAG primary open angle glaucoma; C/D cup-to-disc ratio; HVF humphrey visual field; GVF goldmann visual field; OCT optical coherence tomography; IOP intraocular pressure; BRVO Branch retinal vein occlusion; CRVO central retinal vein occlusion; CRAO central retinal artery occlusion; BRAO branch retinal artery occlusion; RT retinal tear; SB scleral buckle; PPV pars plana vitrectomy; VH Vitreous hemorrhage; PRP panretinal laser photocoagulation; IVK intravitreal kenalog; VMT vitreomacular traction; MH Macular hole;  NVD neovascularization of the disc; NVE neovascularization elsewhere; AREDS age related eye disease study; ARMD age related macular degeneration; POAG primary open angle glaucoma; EBMD epithelial/anterior basement membrane dystrophy; ACIOL anterior chamber intraocular lens; IOL intraocular lens; PCIOL posterior chamber intraocular lens; Phaco/IOL phacoemulsification with intraocular lens placement; [place] photorefractive keratectomy; LASIK laser assisted in situ keratomileusis; HTN hypertension; DM diabetes mellitus; COPD chronic obstructive pulmonary disease

## 2021-08-23 ENCOUNTER — Encounter (HOSPITAL_COMMUNITY): Payer: Self-pay

## 2021-08-23 ENCOUNTER — Ambulatory Visit
Admission: EM | Admit: 2021-08-23 | Discharge: 2021-08-23 | Disposition: A | Discharge status: Home / Self Care | Payer: Medicare Other

## 2021-08-23 ENCOUNTER — Emergency Department (HOSPITAL_COMMUNITY)
Admission: EM | Admit: 2021-08-23 | Discharge: 2021-08-24 | Disposition: A | Discharge status: Home / Self Care | Payer: Medicare Other | Attending: Emergency Medicine | Admitting: Emergency Medicine

## 2021-08-23 ENCOUNTER — Other Ambulatory Visit: Payer: Self-pay

## 2021-08-23 DIAGNOSIS — M810 Age-related osteoporosis without current pathological fracture: Secondary | ICD-10-CM | POA: Diagnosis not present

## 2021-08-23 DIAGNOSIS — Z794 Long term (current) use of insulin: Secondary | ICD-10-CM | POA: Insufficient documentation

## 2021-08-23 DIAGNOSIS — Z79899 Other long term (current) drug therapy: Secondary | ICD-10-CM | POA: Insufficient documentation

## 2021-08-23 DIAGNOSIS — I10 Essential (primary) hypertension: Secondary | ICD-10-CM | POA: Diagnosis not present

## 2021-08-23 DIAGNOSIS — N184 Chronic kidney disease, stage 4 (severe): Secondary | ICD-10-CM | POA: Diagnosis not present

## 2021-08-23 DIAGNOSIS — R2689 Other abnormalities of gait and mobility: Secondary | ICD-10-CM | POA: Diagnosis not present

## 2021-08-23 DIAGNOSIS — E1122 Type 2 diabetes mellitus with diabetic chronic kidney disease: Secondary | ICD-10-CM | POA: Insufficient documentation

## 2021-08-23 DIAGNOSIS — H1189 Other specified disorders of conjunctiva: Secondary | ICD-10-CM | POA: Diagnosis not present

## 2021-08-23 DIAGNOSIS — H5789 Other specified disorders of eye and adnexa: Secondary | ICD-10-CM

## 2021-08-23 DIAGNOSIS — Z9181 History of falling: Secondary | ICD-10-CM | POA: Diagnosis not present

## 2021-08-23 DIAGNOSIS — D631 Anemia in chronic kidney disease: Secondary | ICD-10-CM | POA: Diagnosis not present

## 2021-08-23 DIAGNOSIS — I129 Hypertensive chronic kidney disease with stage 1 through stage 4 chronic kidney disease, or unspecified chronic kidney disease: Secondary | ICD-10-CM | POA: Insufficient documentation

## 2021-08-23 DIAGNOSIS — I2723 Pulmonary hypertension due to lung diseases and hypoxia: Secondary | ICD-10-CM | POA: Diagnosis not present

## 2021-08-23 DIAGNOSIS — Z9889 Other specified postprocedural states: Secondary | ICD-10-CM

## 2021-08-23 DIAGNOSIS — H53141 Visual discomfort, right eye: Secondary | ICD-10-CM | POA: Diagnosis present

## 2021-08-23 DIAGNOSIS — E785 Hyperlipidemia, unspecified: Secondary | ICD-10-CM | POA: Diagnosis not present

## 2021-08-23 DIAGNOSIS — H5711 Ocular pain, right eye: Secondary | ICD-10-CM

## 2021-08-23 MED ORDER — TETRACAINE HCL 0.5 % OP SOLN
1.0000 [drp] | Freq: Once | OPHTHALMIC | Status: AC
  Administered 2021-08-23: 1 [drp] via OPHTHALMIC

## 2021-08-23 NOTE — ED Triage Notes (Signed)
Pt c/o pain to right eye with drainage. States tried to call eye surgeon but provider did not respond to on-call request. States called regular eye provider who recommended pt be seen by ED if unable to be seen by the surgeon and still concerned.

## 2021-08-23 NOTE — ED Triage Notes (Signed)
Pt arrives POV for eval of R eye pain. Daughter reports pt had surgery last wed for removal of blood in retina. Today started w/ worsening drainage, pain and some redness/swelling to surface of eye. Pt denies worsening vision changes.

## 2021-08-23 NOTE — ED Provider Notes (Signed)
Patient here today for evaluation of eye pain after surgery several days ago. She was evaluated by her surgeon yesterday and had some increased pressure in her eye and were advised to discontinue eye drops at that time. Daughter reports that they did as they were told but today she has had increased pain and tearing from her right eye. She was instructed to report to ED if she could not get in touch with her surgeon and patient presented to urgent care. Discussed that since we did not have any way to measure pressure, specifically in the case of recent surgery, she would need to be evaluated in the ED. Patient and daughter express understanding.    Francene Finders, PA-C 08/23/21 1719

## 2021-08-23 NOTE — ED Provider Notes (Signed)
[facility] EMERGENCY DEPARTMENT Provider Note   CSN: [id number] Arrival date & time: 08/23/21  2100     History  Chief Complaint  Patient presents with   Eye Pain    Miranda Roman is a 76 y.o. female.  Patient s/p evacuation of vitreous hemorrhage by Dr. Zadie Rhine on 08/14/21. Presenting today for increased irritation of the R eye, increased clear tearing onset today. Had a reassuring office visit with Dr. Zadie Rhine yesterday. Did have slight increase to her eye pressure to 28, but was taking off all eye drops despite this. Daughter concerned that pressure may be contributing to irritation and that it may have worsened. Patient denies any acute vision changes in the past 24 hours since being seen in the office. No purulent drainage or associated fever, swelling around the eye.  The history is provided by the patient and a relative. No language interpreter was used.  Eye Pain      Home Medications Prior to Admission medications   Medication Sig Start Date End Date Taking? Authorizing Provider  acetaminophen (TYLENOL) 500 MG tablet Take 1 tablet (500 mg total) by mouth every 6 (six) hours as needed for moderate pain or fever. 02/14/20   Oswald Hillock, MD  amLODipine (NORVASC) 2.5 MG tablet Take 2.5 mg by mouth daily. 09/13/20   [provider]  atorvastatin (LIPITOR) 20 MG tablet TAKE ONE TABLET BY MOUTH M-F AND SKIP WEEKENDS 04/15/21   Glendale Chard, MD  AZO-CRANBERRY PO Take 1 capsule by mouth daily at 6 (six) AM.    [provider]  b complex vitamins capsule Take 1 capsule by mouth daily.    [provider]  BD INSULIN SYRINGE U/F 31G X 5/16" 0.3 ML MISC See admin instructions. use with insulin 03/17/20   [provider]  calcitRIOL (ROCALTROL) 0.25 MCG capsule Take 0.25 mcg by mouth daily. 08/27/20   [provider]  cetirizine (ZYRTEC) 10 MG tablet Take 10 mg by mouth daily.    [provider]  Cyanocobalamin  (VITAMIN B12) 1000 MCG TBCR 1 tablet    [provider]  diclofenac Sodium (VOLTAREN) 1 % GEL Apply 2 g topically 4 (four) times daily. Apply to left knee. 02/15/20   Arrien, Jimmy Picket, MD  doxycycline (VIBRA-TABS) 100 MG tablet One  tab po q12 hours on day 1, then one tab po qd 08/15/21   Glendale Chard, MD  epoetin alfa (PROCRIT) 95621 UNIT/ML injection Every other week    [provider]  estradiol (ESTRACE) 0.1 MG/GM vaginal cream  11/08/20   [provider]  gabapentin (NEURONTIN) 100 MG capsule One capsule po qhs 05/16/21   Glendale Chard, MD  HUMALOG 100 UNIT/ML injection INJECT 4 UNIT(S) SUBCUTANEOUSLY 3 TIMES DAILY Patient taking differently: once. INJECT 4 UNIT(S) SUBCUTANEOUSLY 3 TIMES DAILY 05/13/21   Glendale Chard, MD  insulin detemir (LEVEMIR FLEXTOUCH) 100 UNIT/ML FlexPen 6 Units daily.    [provider]  Insulin Pen Needle (BD PEN NEEDLE NANO U/F) 32G X 4 MM MISC Use as directed with insulin pen 06/27/20   Glendale Chard, MD  ketorolac (ACULAR) 0.5 % ophthalmic solution ketorolac 0.5 % eye drops 07/10/21   [provider]  metoprolol tartrate (LOPRESSOR) 50 MG tablet TAKE 1 TABLET BY MOUTH TWICE A DAY 07/08/21   Glendale Chard, MD  Multiple Vitamins-Minerals (CENTRUM SILVER PO) Take by mouth daily.    [provider]  ofloxacin (OCUFLOX) 0.3 % ophthalmic solution ofloxacin 0.3 %  eye drops 07/10/21   [provider]  OneTouch Delica Lancets 93X MISC See admin instructions. 02/09/21   [provider]  sodium bicarbonate 650 MG tablet Take 650 mg by mouth 2 (two) times daily. 08/23/20   [provider]      Allergies    Cefepime and Lactose intolerance (gi)    Review of Systems   Review of Systems  Eyes:  Positive for pain.  Ten systems reviewed and are negative for acute change, except as noted in the HPI.    Physical Exam Updated Vital Signs BP (!) 151/71 (BP Location: Right Arm)    Pulse 78    Temp  97.9 F (36.6 C)    Resp 18    Ht _0  (1.575 m)    Wt 72 kg    SpO2 100%    BMI 29.03 kg/m   Physical Exam Vitals and nursing note reviewed.  Constitutional:      General: She is not in acute distress.    Appearance: She is well-developed. She is not diaphoretic.     Comments: Calm and cooperative  HENT:     Head: Normocephalic and atraumatic.  Eyes:     General: No scleral icterus.    Extraocular Movements: Extraocular movements intact.     Comments: No periorbital edema or erythema of the affected eye. Minimal clear tearing OD. Mild conjunctival injection OD compared to left. Full EOMs. IOP 24 with 95% CI OD.  Pulmonary:     Effort: Pulmonary effort is normal. No respiratory distress.     Comments: Respirations even and unlabored Musculoskeletal:        General: Normal range of motion.     Cervical back: Normal range of motion.  Skin:    General: Skin is warm and dry.     Coloration: Skin is not pale.     Findings: No erythema or rash.  Neurological:     Mental Status: She is alert and oriented to person, place, and time.  Psychiatric:        Behavior: Behavior normal.    ED Results / Procedures / Treatments   Labs (all labs ordered are listed, but only abnormal results are displayed) Labs Reviewed - No data to display  EKG None  Radiology OCT, Retina - OU - Both Eyes  Result Date: 08/22/2021 Right Eye Progression has improved. Left Eye Central Foveal Thickness: 220. Progression has been stable. Notes OD now with clear media, post vitrectomy for nonclearing vitreous hemorrhage, diffuse macular atrophy is present however, similar to the diffuse macular atrophy in the left eye.  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 OS diffuse macular atrophy, no change over time   Procedures Procedures    Medications Ordered in ED Medications  tetracaine (PONTOCAINE) 0.5 % ophthalmic  solution 1 drop (1 drop Right Eye Given 08/23/21 2330)    ED Course/ Medical Decision Making/ A&P Clinical Course as of 08/24/21 0052  Fri Aug 23, 2021  2340 Attempted to reach Dr. Zadie Rhine (ophthalmology) to discuss patient case and ensure close outpatient follow up is appropriate. HIPAA compliant message left. [KH]  Sat Aug 24, 2021  0022 No call back received. Patient has been stable since arrival. Given improved IOP with reassuring exam, do not feel further emergent evaluation is indicated. Suspect there may be a degree of irritation associated with cessation of her steroid drops as this may  have been managing some post procedural inflammation. She had interval improvement to her irritation with Tylenol earlier in the afternoon. Have advised patient continue this and reach back out to her doctor tomorrow for reassessment in the office. Extensive discussion had regarding return precautions. [KH]    Clinical Course User Index [KH] Antonietta Breach, PA-C                           Medical Decision Making Risk Prescription drug management.   This patient presents to the ED for concern of R eye irritation, this involves an extensive number of treatment options, and is a complaint that carries with it a high risk of complications and morbidity.  The differential diagnosis includes post procedural inflammation vs corneal abrasion or ulcer vs acute glaucoma vs iritis/uveitis vs preseptal cellulitis vs orbital cellulitis.   Co morbidities that complicate the patient evaluation  DM HTN Hx of encephalopathy   Additional history obtained:  Additional history obtained from daughter External records from outside source obtained and reviewed including office follow up visit with ophthalmologist (Dr. Zadie Rhine) yesterday   Medicines ordered and prescription drug management:  I ordered medication including tetracaine for eye irritation  Reevaluation of the patient after these medicines showed that the  patient improved I have reviewed the patients home medicines and have made adjustments as needed   Test Considered:  Fluorescein staining Orbital CT   Consultations Obtained:  I attempted consultation with the patient's ophthalmologist Dr. Zadie Rhine; however, was unable to successfully contact despite messages left   Reevaluation:  After the interventions noted above, I reevaluated the patient and found that they have :improved   Social Determinants of Health:  Good social support; daughter at bedside Ambulatory with ealker   Dispostion:  After consideration of the diagnostic results and the patients response to treatment, I feel that the patent would benefit from close outpatient specialist follow up.  No acute concern for preseptal cellulitis, orbital cellulitis, or bacterial conjunctivitis based on exam.  Daughter reports that the overall appearance of the eye over the past 24 hours is unchanged. Patient has not had any associated acute vision changes or visual field defect/disturbance.   The patient has full EOMs without signs of entrapment.  Her intraocular pressure in the office yesterday, upon chart review, was 28.  This is improved to 24 today.  IOP levels not c/w concern for acute glaucoma.  Question whether there may be some increased subjective irritation today after the patient discontinued her steroid drops (at the advice of her ophthalmologist).  This may have been mitigating a degree of postprocedural inflammation.    She did have some improvement to her eye irritation with Tylenol.  I have advised that she continue this. Return precautions discussed and provided. Patient discharged in stable condition with no unaddressed concerns.        Final Clinical Impression(s) / ED Diagnoses Final diagnoses:  Eye irritation    Rx / DC Orders ED Discharge Orders     None         Antonietta Breach, PA-C 08/24/21 0101    Drenda Freeze, MD 08/24/21 [phone]

## 2021-08-24 NOTE — Discharge Instructions (Addendum)
Your intraocular pressure in the emergency department today was 24.  This is improved in comparison with your visit to your ophthalmologist yesterday.  We recommend that you follow-up with your ophthalmologist in the office for continued reassessment.  Should you develop any fever, severe worsening of your eye pain, changes to your vision, pus draining from your eye, notable increased eye redness, swelling or redness around the eye we advise prompt reevaluation in the emergency department.  You may also return for any other new or concerning symptoms.

## 2021-08-26 ENCOUNTER — Telehealth: Payer: Self-pay

## 2021-08-26 NOTE — Telephone Encounter (Signed)
Transition Care Management Follow-up Telephone Call Date of discharge and from where: 08/24/2021 [place] How have you been since you were released from the hospital? Pt stayes she feels better.  Any questions or concerns? No  Items Reviewed: Did the pt receive and understand the discharge instructions provided? Yes  Medications obtained and verified? Yes  Other? Yes  Any new allergies since your discharge? No  Dietary orders reviewed? Yes Do you have support at home? Yes   Home Care and Equipment/Supplies: Were home health services ordered? no If so, what is the name of the agency? N/a  Has the agency set up a time to come to the patient's home? no Were any new equipment or medical supplies ordered?  No What is the name of the medical supply agency? N/a Were you able to get the supplies/equipment? not applicable Do you have any questions related to the use of the equipment or supplies? No  Functional Questionnaire: (I = Independent and D = Dependent) ADLs: I/d  Bathing/Dressing- I/d  Meal Prep- I/d  Eating- I/d  Maintaining continence- I/d  Transferring/Ambulation- I/d  Managing Meds- I/d  Follow up appointments reviewed:  PCP Hospital f/u appt confirmed? No  Scheduled to see n/a on n/a @ n/a. [facility] f/u appt confirmed? No  Scheduled to see n/a on n/a @ n/a. Are transportation arrangements needed? No  If their condition worsens, is the pt aware to call PCP or go to the Emergency Dept.? Yes Was the patient provided with contact information for the PCP's office or ED? Yes Was to pt encouraged to call back with questions or concerns? Yes

## 2021-08-27 ENCOUNTER — Other Ambulatory Visit (INDEPENDENT_AMBULATORY_CARE_PROVIDER_SITE_OTHER): Payer: Medicare Other

## 2021-08-27 DIAGNOSIS — R350 Frequency of micturition: Secondary | ICD-10-CM

## 2021-08-27 LAB — POCT URINALYSIS DIPSTICK
Bilirubin, UA: NEGATIVE
Glucose, UA: NEGATIVE
Ketones, UA: NEGATIVE
Nitrite, UA: NEGATIVE
Protein, UA: POSITIVE — AB
Spec Grav, UA: 1.015 (ref 1.010–1.025)
Urobilinogen, UA: 0.2 E.U./dL
pH, UA: 6 (ref 5.0–8.0)

## 2021-08-30 DIAGNOSIS — M810 Age-related osteoporosis without current pathological fracture: Secondary | ICD-10-CM | POA: Diagnosis not present

## 2021-08-30 DIAGNOSIS — R2689 Other abnormalities of gait and mobility: Secondary | ICD-10-CM | POA: Diagnosis not present

## 2021-08-30 DIAGNOSIS — N184 Chronic kidney disease, stage 4 (severe): Secondary | ICD-10-CM | POA: Diagnosis not present

## 2021-08-30 DIAGNOSIS — I129 Hypertensive chronic kidney disease with stage 1 through stage 4 chronic kidney disease, or unspecified chronic kidney disease: Secondary | ICD-10-CM | POA: Diagnosis not present

## 2021-08-30 DIAGNOSIS — I2723 Pulmonary hypertension due to lung diseases and hypoxia: Secondary | ICD-10-CM | POA: Diagnosis not present

## 2021-08-30 DIAGNOSIS — I10 Essential (primary) hypertension: Secondary | ICD-10-CM | POA: Diagnosis not present

## 2021-08-30 DIAGNOSIS — D631 Anemia in chronic kidney disease: Secondary | ICD-10-CM | POA: Diagnosis not present

## 2021-08-30 DIAGNOSIS — E785 Hyperlipidemia, unspecified: Secondary | ICD-10-CM | POA: Diagnosis not present

## 2021-08-30 DIAGNOSIS — Z9181 History of falling: Secondary | ICD-10-CM | POA: Diagnosis not present

## 2021-08-30 DIAGNOSIS — Z794 Long term (current) use of insulin: Secondary | ICD-10-CM | POA: Diagnosis not present

## 2021-08-30 DIAGNOSIS — E1122 Type 2 diabetes mellitus with diabetic chronic kidney disease: Secondary | ICD-10-CM | POA: Diagnosis not present

## 2021-09-01 ENCOUNTER — Encounter: Payer: Self-pay | Admitting: Internal Medicine

## 2021-09-01 LAB — URINE CULTURE

## 2021-09-04 DIAGNOSIS — I2723 Pulmonary hypertension due to lung diseases and hypoxia: Secondary | ICD-10-CM | POA: Diagnosis not present

## 2021-09-04 DIAGNOSIS — Z794 Long term (current) use of insulin: Secondary | ICD-10-CM | POA: Diagnosis not present

## 2021-09-04 DIAGNOSIS — R2689 Other abnormalities of gait and mobility: Secondary | ICD-10-CM | POA: Diagnosis not present

## 2021-09-04 DIAGNOSIS — Z9181 History of falling: Secondary | ICD-10-CM | POA: Diagnosis not present

## 2021-09-04 DIAGNOSIS — I10 Essential (primary) hypertension: Secondary | ICD-10-CM | POA: Diagnosis not present

## 2021-09-04 DIAGNOSIS — N184 Chronic kidney disease, stage 4 (severe): Secondary | ICD-10-CM | POA: Diagnosis not present

## 2021-09-04 DIAGNOSIS — M810 Age-related osteoporosis without current pathological fracture: Secondary | ICD-10-CM | POA: Diagnosis not present

## 2021-09-04 DIAGNOSIS — E785 Hyperlipidemia, unspecified: Secondary | ICD-10-CM | POA: Diagnosis not present

## 2021-09-04 DIAGNOSIS — I129 Hypertensive chronic kidney disease with stage 1 through stage 4 chronic kidney disease, or unspecified chronic kidney disease: Secondary | ICD-10-CM | POA: Diagnosis not present

## 2021-09-04 DIAGNOSIS — D631 Anemia in chronic kidney disease: Secondary | ICD-10-CM | POA: Diagnosis not present

## 2021-09-04 DIAGNOSIS — E1122 Type 2 diabetes mellitus with diabetic chronic kidney disease: Secondary | ICD-10-CM | POA: Diagnosis not present

## 2021-09-06 ENCOUNTER — Other Ambulatory Visit: Payer: Self-pay

## 2021-09-06 ENCOUNTER — Encounter (HOSPITAL_COMMUNITY)
Admission: RE | Admit: 2021-09-06 | Discharge: 2021-09-06 | Disposition: A | Discharge status: Home / Self Care | Payer: Medicare Other | Source: Ambulatory Visit | Attending: Nephrology | Admitting: Nephrology

## 2021-09-06 VITALS — BP 147/75 | HR 73 | Resp 20

## 2021-09-06 DIAGNOSIS — E1122 Type 2 diabetes mellitus with diabetic chronic kidney disease: Secondary | ICD-10-CM | POA: Insufficient documentation

## 2021-09-06 DIAGNOSIS — N184 Chronic kidney disease, stage 4 (severe): Secondary | ICD-10-CM | POA: Insufficient documentation

## 2021-09-06 LAB — IRON AND TIBC
Iron: 78 ug/dL (ref 28–170)
Saturation Ratios: 31 % (ref 10.4–31.8)
TIBC: 248 ug/dL — ABNORMAL LOW (ref 250–450)
UIBC: 170 ug/dL

## 2021-09-06 LAB — POCT HEMOGLOBIN-HEMACUE: Hemoglobin: 10.3 g/dL — ABNORMAL LOW (ref 12.0–15.0)

## 2021-09-06 LAB — FERRITIN: Ferritin: 185 ng/mL (ref 11–307)

## 2021-09-06 MED ORDER — EPOETIN ALFA-EPBX 10000 UNIT/ML IJ SOLN
10000.0000 [IU] | INTRAMUSCULAR | Status: DC
  Administered 2021-09-06: 10000 [IU] via SUBCUTANEOUS

## 2021-09-06 MED ORDER — EPOETIN ALFA-EPBX 10000 UNIT/ML IJ SOLN
INTRAMUSCULAR | Status: AC
  Filled 2021-09-06: qty 1

## 2021-09-07 ENCOUNTER — Emergency Department (HOSPITAL_BASED_OUTPATIENT_CLINIC_OR_DEPARTMENT_OTHER): Payer: Medicare Other

## 2021-09-07 ENCOUNTER — Emergency Department (HOSPITAL_BASED_OUTPATIENT_CLINIC_OR_DEPARTMENT_OTHER)
Admission: EM | Admit: 2021-09-07 | Discharge: 2021-09-08 | Disposition: A | Discharge status: Home / Self Care | Payer: Medicare Other | Attending: Emergency Medicine | Admitting: Emergency Medicine

## 2021-09-07 ENCOUNTER — Encounter (HOSPITAL_BASED_OUTPATIENT_CLINIC_OR_DEPARTMENT_OTHER): Payer: Self-pay | Admitting: Emergency Medicine

## 2021-09-07 ENCOUNTER — Other Ambulatory Visit: Payer: Self-pay

## 2021-09-07 DIAGNOSIS — I129 Hypertensive chronic kidney disease with stage 1 through stage 4 chronic kidney disease, or unspecified chronic kidney disease: Secondary | ICD-10-CM | POA: Insufficient documentation

## 2021-09-07 DIAGNOSIS — Z87891 Personal history of nicotine dependence: Secondary | ICD-10-CM | POA: Diagnosis not present

## 2021-09-07 DIAGNOSIS — M25461 Effusion, right knee: Secondary | ICD-10-CM | POA: Diagnosis not present

## 2021-09-07 DIAGNOSIS — Z23 Encounter for immunization: Secondary | ICD-10-CM | POA: Diagnosis not present

## 2021-09-07 DIAGNOSIS — W010XXA Fall on same level from slipping, tripping and stumbling without subsequent striking against object, initial encounter: Secondary | ICD-10-CM | POA: Diagnosis not present

## 2021-09-07 DIAGNOSIS — E1122 Type 2 diabetes mellitus with diabetic chronic kidney disease: Secondary | ICD-10-CM | POA: Diagnosis not present

## 2021-09-07 DIAGNOSIS — Z79899 Other long term (current) drug therapy: Secondary | ICD-10-CM | POA: Diagnosis not present

## 2021-09-07 DIAGNOSIS — Z794 Long term (current) use of insulin: Secondary | ICD-10-CM | POA: Insufficient documentation

## 2021-09-07 DIAGNOSIS — S80211A Abrasion, right knee, initial encounter: Secondary | ICD-10-CM | POA: Insufficient documentation

## 2021-09-07 DIAGNOSIS — S0992XA Unspecified injury of nose, initial encounter: Secondary | ICD-10-CM | POA: Diagnosis present

## 2021-09-07 DIAGNOSIS — N189 Chronic kidney disease, unspecified: Secondary | ICD-10-CM | POA: Insufficient documentation

## 2021-09-07 DIAGNOSIS — S022XXA Fracture of nasal bones, initial encounter for closed fracture: Secondary | ICD-10-CM | POA: Diagnosis not present

## 2021-09-07 DIAGNOSIS — S8391XA Sprain of unspecified site of right knee, initial encounter: Secondary | ICD-10-CM | POA: Diagnosis not present

## 2021-09-07 DIAGNOSIS — Z043 Encounter for examination and observation following other accident: Secondary | ICD-10-CM | POA: Diagnosis not present

## 2021-09-07 MED ORDER — TETANUS-DIPHTH-ACELL PERTUSSIS 5-2.5-18.5 LF-MCG/0.5 IM SUSY
0.5000 mL | PREFILLED_SYRINGE | Freq: Once | INTRAMUSCULAR | Status: AC
  Administered 2021-09-07: 0.5 mL via INTRAMUSCULAR
  Filled 2021-09-07: qty 0.5

## 2021-09-07 MED ORDER — ACETAMINOPHEN 500 MG PO TABS
1000.0000 mg | ORAL_TABLET | Freq: Once | ORAL | Status: AC
  Administered 2021-09-07: 1000 mg via ORAL
  Filled 2021-09-07: qty 2

## 2021-09-07 NOTE — ED Triage Notes (Signed)
?  Patient comes in after sustaining a fall around 2130.  Patient states she was going to bed and dropped her glasses on the floor, she leaned forward to pick them up off the ground and lost her balance causing herself to face plant on the hardwood floor.  No syncope or dizziness.  Not on blood thinners.  Patient has swelling to nasal bones, and maxillary area above mouth.  Patient able to breathe freely from nose.  Also endorses R knee pain and L hand pain.  No obviously deformity to extremities.  Patient was able to ambulate to bed.  Pain 6/10, sore/achy. ?

## 2021-09-07 NOTE — ED Provider Notes (Addendum)
[facility] EMERGENCY Provider Note: Georgena Spurling, MD, FACEP  CSN: [id number] MRN: [id number] ARRIVAL: 09/07/21 at [place]: [place] PRESENT ILLNESS  09/07/21 10:52 PM Miranda Roman is a 76 y.o. female who was going to bed about 9:30 PM and dropped her glasses.  She leaned forward to pick them up and lost her balance.  She fell forward and struck her face on the hardwood floor.  She did not lose consciousness and was not lightheaded before this happened.  She is not on anticoagulation.  She has pain and swelling to her face without epistaxis.  She is also having right knee pain and left hand pain.  She has been able to ambulate since the fall.  She rates her pain as a 6 out of 10, sore and achy in nature.  It is worse with palpation or movement.  Tetanus status is unknown.   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   OTHER SURGICAL HISTORY     sickle cell retinopathy laser surgery   TUBAL LIGATION  04/28/1977    Family History  Problem Relation Age of Onset   Diabetes Mother    Heart disease Mother    Hypertension Mother    Stroke Mother    Diabetes Father    Heart disease Father    Diabetes Sister    Diabetes Brother    Hypertension Brother    Sickle cell anemia Brother    Diabetes Maternal Aunt    Diabetes Maternal Uncle    Diabetes Maternal Grandmother    Diabetes Maternal Grandfather    Sickle cell anemia Sister    Sickle cell anemia Sister    Healthy Son    Healthy Son    Healthy Daughter     Social History   Tobacco Use   Smoking status: Former    Packs/day: 0.25    Years: 43.00    Pack years: 10.75    Types: Cigarettes    Start  date: 04/1962    Quit date: 04/2005    Years since quitting: 16.4   Smokeless tobacco: Never  Vaping Use   Vaping Use: Never used  Substance Use Topics   Alcohol use: Not Currently    Comment: occasionally   Drug use: No    Prior to Admission medications   Medication Sig Start Date End Date Taking? Authorizing Provider  acetaminophen (TYLENOL) 500 MG tablet Take 1 tablet (500 mg total) by mouth every 6 (six) hours as needed for moderate pain or fever. 02/14/20   Oswald Hillock, MD  amLODipine (NORVASC) 2.5 MG tablet Take 2.5 mg by mouth daily. 09/13/20   [provider]  atorvastatin (LIPITOR) 20 MG tablet TAKE ONE TABLET BY MOUTH M-F AND SKIP WEEKENDS 04/15/21   Glendale Chard, MD  AZO-CRANBERRY PO Take 1 capsule by mouth daily at 6 (six) AM.    [provider]  b complex vitamins capsule Take 1 capsule by mouth daily.    [provider]  BD INSULIN SYRINGE U/F 31G X 5/16" 0.3 ML MISC See admin  instructions. use with insulin 03/17/20   [provider]  calcitRIOL (ROCALTROL) 0.25 MCG capsule Take 0.25 mcg by mouth daily. 08/27/20   [provider]  cetirizine (ZYRTEC) 10 MG tablet Take 10 mg by mouth daily.    [provider]  Cyanocobalamin (VITAMIN B12) 1000 MCG TBCR 1 tablet    [provider]  diclofenac Sodium (VOLTAREN) 1 % GEL Apply 2 g topically 4 (four) times daily. Apply to left knee. 02/15/20   Arrien, Jimmy Picket, MD  doxycycline (VIBRA-TABS) 100 MG tablet One  tab po q12 hours on day 1, then one tab po qd 08/15/21   Glendale Chard, MD  epoetin alfa (PROCRIT) 37169 UNIT/ML injection Every other week    [provider]  estradiol (ESTRACE) 0.1 MG/GM vaginal cream  11/08/20   [provider]  gabapentin (NEURONTIN) 100 MG capsule One capsule po qhs 05/16/21   Glendale Chard, MD  HUMALOG 100 UNIT/ML injection INJECT 4 UNIT(S) SUBCUTANEOUSLY 3 TIMES DAILY Patient taking differently: once. INJECT 4 UNIT(S)  SUBCUTANEOUSLY 3 TIMES DAILY 05/13/21   Glendale Chard, MD  insulin detemir (LEVEMIR FLEXTOUCH) 100 UNIT/ML FlexPen 6 Units daily.    [provider]  Insulin Pen Needle (BD PEN NEEDLE NANO U/F) 32G X 4 MM MISC Use as directed with insulin pen 06/27/20   Glendale Chard, MD  ketorolac (ACULAR) 0.5 % ophthalmic solution ketorolac 0.5 % eye drops 07/10/21   [provider]  metoprolol tartrate (LOPRESSOR) 50 MG tablet TAKE 1 TABLET BY MOUTH TWICE A DAY 07/08/21   Glendale Chard, MD  Multiple Vitamins-Minerals (CENTRUM SILVER PO) Take by mouth daily.    [provider]  ofloxacin (OCUFLOX) 0.3 % ophthalmic solution ofloxacin 0.3 % eye drops 07/10/21   [provider]  OneTouch Delica Lancets 67E MISC See admin instructions. 02/09/21   [provider]  sodium bicarbonate 650 MG tablet Take 650 mg by mouth 2 (two) times daily. 08/23/20   [provider]    Allergies Cefepime and Lactose intolerance (gi)   REVIEW OF SYSTEMS  Negative except as noted here or in the History of Present Illness.   PHYSICAL EXAMINATION  Initial Vital Signs Blood pressure (!) 181/82, pulse 70, temperature 98.4 F (36.9 C), temperature source Oral, resp. rate 18, height _0  (1.575 m), weight 71.7 kg, SpO2 100 %.  Examination General: Well-developed, well-nourished female in no acute distress; appearance consistent with age of record HENT: normocephalic; swelling and ecchymosis of nose and right cheek:    Eyes: pupils equal, round and reactive to light; extraocular muscles intact Neck: supple; nontender Heart: regular rate and rhythm Lungs: clear to auscultation bilaterally Abdomen: soft; nondistended; nontender; bowel sounds present Extremities: No acute deformity; mild tenderness of the left third, fourth and fifth fingers without swelling, ecchymosis or functional deficit; abrasion and small effusion right knee; pulses normal Neurologic: Awake, alert and oriented;  motor function intact in all extremities and symmetric; no facial droop Skin: Warm and dry Psychiatric: Normal mood and affect   RESULTS  Summary of this visit's results, reviewed and interpreted by myself:   EKG Interpretation  Date/Time:    Ventricular Rate:    PR Interval:    QRS Duration:   QT Interval:    QTC Calculation:   R Axis:     Text Interpretation:         Laboratory Studies: No results found for this or any previous visit (from the past 24 hour(s)). Imaging Studies: DG Knee  Complete 4 Views Right  Result Date: 09/07/2021 CLINICAL DATA:  Fall. EXAM: RIGHT KNEE - COMPLETE 4+ VIEW COMPARISON:  None. FINDINGS: Joint effusion is present. The bones are osteopenic. No acute fracture or dislocation identified. Joint spaces are maintained. There are vascular calcifications in the soft tissues. IMPRESSION: 1. No acute fracture or dislocation. 2. Joint effusion. 3. Osteopenia. Electronically Signed   By: Ronney Asters M.D.   On: 09/07/2021 23:34   CT Maxillofacial Wo Contrast  Result Date: 09/07/2021 CLINICAL DATA:  Status post trauma. EXAM: CT MAXILLOFACIAL WITHOUT CONTRAST TECHNIQUE: Multidetector CT imaging of the maxillofacial structures was performed. Multiplanar CT image reconstructions were also generated. RADIATION DOSE REDUCTION: This exam was performed according to the departmental dose-optimization program which includes automated exposure control, adjustment of the mA and/or kV according to patient size and/or use of iterative reconstruction technique. COMPARISON:  None. FINDINGS: Osseous: Comminuted, mildly displaced bilateral nasal bone fractures are seen. Orbits: Negative. No traumatic or inflammatory finding. Sinuses: Clear. Soft tissues: There is mild to moderate severity paranasal soft tissue swelling. Limited intracranial: No significant or unexpected finding. IMPRESSION: Comminuted, mildly displaced bilateral nasal bone fractures. Electronically Signed   By:  Virgina Norfolk M.D.   On: 09/07/2021 23:40    ED COURSE and MDM  Nursing notes, initial and subsequent vitals signs, including pulse oximetry, reviewed and interpreted by myself.  Vitals:   09/07/21 2243 09/07/21 2335  BP: (!) 181/82 (!) 154/95  Pulse: 70 68  Resp: 18 16  Temp: 98.4 F (36.9 C)   TempSrc: Oral   SpO2: 100% 100%  Weight: 71.7 kg   Height: _0  (1.575 m)    Medications  acetaminophen (TYLENOL) tablet 1,000 mg (has no administration in time range)  Tdap (BOOSTRIX) injection 0.5 mL (0.5 mLs Intramuscular Given 09/07/21 2332)   CT scan shows bilateral nasal bone fractures.  We will refer the patient to the maxillofacial Surgeon on-call.  She has an effusion of the right knee suggesting sprain of the knee.  We will place her in a knee brace.   PROCEDURES  Procedures   ED DIAGNOSES     ICD-10-CM   1. Fall from slip, trip, or stumble, initial encounter  W01.0XXA     2. Closed fracture of nasal bone, initial encounter  S02.2XXA     3. Sprain of right knee, unspecified ligament, initial encounter  S83.91XA     4. Abrasion of right knee, initial encounter  I50.388E          Shanon Rosser, MD 09/07/21 2349    Shanon Rosser, MD 09/07/21 2349

## 2021-09-09 ENCOUNTER — Telehealth: Payer: Self-pay

## 2021-09-09 NOTE — Telephone Encounter (Signed)
Transition Care Management Follow-up Telephone Call ?Date of discharge and from where: 09/07/2021 [place] [facility]  ?How have you been since you were released from the hospital? Pt states she is doing fine, her nose is still broken.  ?Any questions or concerns? No ? ?Items Reviewed: ?Did the pt receive and understand the discharge instructions provided? Yes  ?Medications obtained and verified? Yes  ?Other? Yes  ?Any new allergies since your discharge? No  ?Dietary orders reviewed? Yes ?Do you have support at home? Yes  ? ?Home Care and Equipment/Supplies: ?Were home health services ordered? no ?If so, what is the name of the agency? N/a  ?Has the agency set up a time to come to the patient's home? no ?Were any new equipment or medical supplies ordered?  No ?What is the name of the medical supply agency? N/a ?Were you able to get the supplies/equipment? not applicable ?Do you have any questions related to the use of the equipment or supplies? No ? ?Functional Questionnaire: (I = Independent and D = Dependent) ?ADLs: d ? ?Bathing/Dressing- d ? ?Meal Prep- d ? ?Eating- d ? ?Maintaining continence- d ? ?Transferring/Ambulation- d ? ?Managing Meds- d ? ?Follow up appointments reviewed: ? ?PCP Hospital f/u appt confirmed? No  Scheduled to see n/a on n/a @ n/a. ?[facility] f/u appt confirmed? No  Scheduled to see n/a on n/a @ n/a. ?Are transportation arrangements needed? No  ?If their condition worsens, is the pt aware to call PCP or go to the Emergency Dept.? Yes ?Was the patient provided with contact information for the PCP's office or ED? Yes ?Was to pt encouraged to call back with questions or concerns? Yes= ?

## 2021-09-10 DIAGNOSIS — I2723 Pulmonary hypertension due to lung diseases and hypoxia: Secondary | ICD-10-CM | POA: Diagnosis not present

## 2021-09-10 DIAGNOSIS — Z9181 History of falling: Secondary | ICD-10-CM | POA: Diagnosis not present

## 2021-09-10 DIAGNOSIS — M810 Age-related osteoporosis without current pathological fracture: Secondary | ICD-10-CM | POA: Diagnosis not present

## 2021-09-10 DIAGNOSIS — E785 Hyperlipidemia, unspecified: Secondary | ICD-10-CM | POA: Diagnosis not present

## 2021-09-10 DIAGNOSIS — E1122 Type 2 diabetes mellitus with diabetic chronic kidney disease: Secondary | ICD-10-CM | POA: Diagnosis not present

## 2021-09-10 DIAGNOSIS — I129 Hypertensive chronic kidney disease with stage 1 through stage 4 chronic kidney disease, or unspecified chronic kidney disease: Secondary | ICD-10-CM | POA: Diagnosis not present

## 2021-09-10 DIAGNOSIS — I10 Essential (primary) hypertension: Secondary | ICD-10-CM | POA: Diagnosis not present

## 2021-09-10 DIAGNOSIS — Z794 Long term (current) use of insulin: Secondary | ICD-10-CM | POA: Diagnosis not present

## 2021-09-10 DIAGNOSIS — R2689 Other abnormalities of gait and mobility: Secondary | ICD-10-CM | POA: Diagnosis not present

## 2021-09-10 DIAGNOSIS — N184 Chronic kidney disease, stage 4 (severe): Secondary | ICD-10-CM | POA: Diagnosis not present

## 2021-09-10 DIAGNOSIS — D631 Anemia in chronic kidney disease: Secondary | ICD-10-CM | POA: Diagnosis not present

## 2021-09-12 ENCOUNTER — Encounter (INDEPENDENT_AMBULATORY_CARE_PROVIDER_SITE_OTHER): Payer: Medicare Other | Admitting: Ophthalmology

## 2021-09-12 ENCOUNTER — Encounter: Payer: Self-pay | Admitting: Nurse Practitioner

## 2021-09-12 ENCOUNTER — Telehealth (INDEPENDENT_AMBULATORY_CARE_PROVIDER_SITE_OTHER): Payer: Medicare Other | Admitting: Nurse Practitioner

## 2021-09-12 DIAGNOSIS — S022XXD Fracture of nasal bones, subsequent encounter for fracture with routine healing: Secondary | ICD-10-CM | POA: Diagnosis not present

## 2021-09-12 DIAGNOSIS — M25562 Pain in left knee: Secondary | ICD-10-CM | POA: Diagnosis not present

## 2021-09-12 DIAGNOSIS — W19XXXA Unspecified fall, initial encounter: Secondary | ICD-10-CM | POA: Diagnosis not present

## 2021-09-12 DIAGNOSIS — M25561 Pain in right knee: Secondary | ICD-10-CM

## 2021-09-12 MED ORDER — DICLOFENAC SODIUM 1 % EX GEL: 2.0000 g | Freq: Four times a day (QID) | CUTANEOUS | 0 refills | Status: DC

## 2021-09-12 NOTE — Patient Instructions (Signed)
Understanding Your Risk for Falls Each year, millions of people have serious injuries from falls. It is important to understand your risk for falling. Talk with your health care provider about your risk and what you can do to lower it. There are actions you can take athome to lower your risk and prevent falls. If you do have a serious fall, make sure to tell your health care provider.Falling once raises your risk of falling again. How can falls affect me? Serious injuries from falls are common. These include: Broken bones, such as hip fractures. Head injuries, such as traumatic brain injuries (TBI) or concussion. A fear of falling can cause you to avoid activities and stay at home. This canmake your muscles weaker and actually raise your risk for a fall. What can increase my risk? There are a number of risk factors that increase your risk for falling. The more risk factors you have, the higher your risk of falling. Serious injuries from a fall happen most often to people older than age 65. Children and youngadults ages 15-29 are also at higher risk. Common risk factors include: Weakness in the lower body. Lack (deficiency) of vitamin D. Being generally weak or confused due to long-term (chronic) illness. Dizziness or balance problems. Poor vision. Medicines that cause dizziness or drowsiness. These can include medicines for your blood pressure, heart, anxiety, insomnia, or edema, as well as pain medicines and muscle relaxants. Other risk factors include: Drinking alcohol. Having had a fall in the past. Having depression. Having foot pain or wearing improper footwear. Working at a dangerous job. Having any of the following in your home: Tripping hazards, such as floor clutter or loose rugs. Poor lighting. Pets. Having dementia or memory loss. What actions can I take to lower my risk of falling?     Physical activity Maintain physical fitness. Do strength and balance exercises.  Consider taking aregular class to build strength and balance. Yoga and tai chi are good options. Vision Have your eyes checked every year and your vision prescription updated asneeded. Walking aids and footwear Wear nonskid shoes. Do not wear high heels. Do not walk around the house in socks or slippers. Use a cane or walker as told by your health care provider. Home safety Attach secure railings on both sides of your stairs. Install grab bars for your tub, shower, and toilet. Use a bath mat in your tub or shower. Use good lighting in all rooms. Keep a flashlight near your bed. Make sure there is a clear path from your bed to the bathroom. Use night-lights. Do not use throw rugs. Make sure all carpeting is taped or tacked down securely. Remove all clutter from walkways and stairways, including extension cords. Repair uneven or broken steps. Avoid walking on icy or slippery surfaces. Walk on the grass instead of on icy or slick sidewalks. Use ice melt to get rid of ice on walkways. Use a cordless phone. Questions to ask your health care provider Can you help me check my risk for a fall? Do any of my medicines make me more likely to fall? Should I take a vitamin D supplement? What exercises can I do to improve my strength and balance? Should I make an appointment to have my vision checked? Do I need a bone density test to check for weak bones or osteoporosis? Would it help to use a cane or a walker? Where to find more information Centers for Disease Control and Prevention, [facility]: www.cdc.gov Community-Based Fall Prevention Programs:   www.cdc.gov National Institute on Aging: www.nia.nih.gov Contact a health care provider if: You fall at home. You are afraid of falling at home. You feel weak, drowsy, or dizzy. Summary Serious injuries from a fall happen most often to people older than age 65. Children and young adults ages 15-29 are also at higher risk. Talk with your health care  provider about your risks for falling and how to lower those risks. Taking certain precautions at home can lower your risk for falling. If you fall, always tell your health care provider. This information is not intended to replace advice given to you by your health care provider. Make sure you discuss any questions you have with your healthcare provider. Document Revised: 01/25/2020 Document Reviewed: 01/25/2020 Elsevier Patient Education  2022 Elsevier Inc.  

## 2021-09-12 NOTE — Progress Notes (Signed)
Virtual Visit via MyChart   This visit type was conducted due to national recommendations for restrictions regarding the COVID-19 Pandemic (e.g. social distancing) in an effort to limit this patient's exposure and mitigate transmission in our community.  Due to her co-morbid illnesses, this patient is at least at moderate risk for complications without adequate follow up.  This format is felt to be most appropriate for this patient at this time.  All issues noted in this document were discussed and addressed.  A limited physical exam was performed with this format.    This visit type was conducted due to national recommendations for restrictions regarding the COVID-19 Pandemic (e.g. social distancing) in an effort to limit this patient's exposure and mitigate transmission in our community.  Patients identity confirmed using two different identifiers.  This format is felt to be most appropriate for this patient at this time.  All issues noted in this document were discussed and addressed.  No physical exam was performed (except for noted visual exam findings with Video Visits).    Date:  09/12/2021   ID:  Miranda Roman, DOB May 10, 1946, MRN [id number]  Patient Location:  Home - Miranda Roman and son Miranda Roman  Provider location:   Office    Chief Complaint:  fall, fractured nose  History of Present Illness:    Miranda Roman is a 76 y.o. female who presents via video conferencing for a telehealth visit today.    The patient does not have symptoms concerning for COVID-19 infection (fever, chills, cough, or new shortness of breath).   Patient presents today for ER hospital follow up. On 3/4 she was leaning over to pick up her glasses and fell over and hit her face forward on the floor.  She is also having knee pain both she has fluid on her knees but is getting. She is taking tylenol, with some relief. Fracture to her nose she is unsure if the swelling is better. Her son Miranda Roman is  present and feels compared to a picture he seen it is better.  She had cataract surgery one month - she has an eye patch to her right eye, she had surgery twice. Dr. Corrin Parker then Dr. Zadie Rhine did the second surgery - she is having problems seeing she was due to go for follow up today and was changed to next week.     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   amLODipine (NORVASC) 2.5 MG tablet Take 2.5 mg by mouth daily.   atorvastatin (LIPITOR) 20 MG tablet TAKE ONE TABLET BY MOUTH M-F AND SKIP WEEKENDS   AZO-CRANBERRY PO Take 1 capsule by mouth daily at 6 (six) AM.   b complex vitamins capsule Take 1 capsule by mouth daily.   BD INSULIN SYRINGE U/F 31G X 5/16" 0.3  ML MISC See admin instructions. use with insulin   calcitRIOL (ROCALTROL) 0.25 MCG capsule Take 0.25 mcg by mouth daily.   cetirizine (ZYRTEC) 10 MG tablet Take 10 mg by mouth daily.   Cyanocobalamin (VITAMIN B12) 1000 MCG TBCR 1 tablet   epoetin alfa (PROCRIT) 22482 UNIT/ML injection Every other week   estradiol (ESTRACE) 0.1 MG/GM vaginal cream    gabapentin (NEURONTIN) 100 MG capsule One capsule po qhs   HUMALOG 100 UNIT/ML injection INJECT 4 UNIT(S) SUBCUTANEOUSLY 3 TIMES DAILY (Patient taking differently: once. INJECT 4 UNIT(S) SUBCUTANEOUSLY 3 TIMES DAILY)   insulin detemir (LEVEMIR FLEXTOUCH) 100 UNIT/ML FlexPen 6 Units daily.   Insulin Pen Needle (BD PEN NEEDLE NANO U/F) 32G X 4  MM MISC Use as directed with insulin pen   ketorolac (ACULAR) 0.5 % ophthalmic solution ketorolac 0.5 % eye drops   metoprolol tartrate (LOPRESSOR) 50 MG tablet TAKE 1 TABLET BY MOUTH TWICE A DAY   Multiple Vitamins-Minerals (CENTRUM SILVER PO) Take by mouth daily.   ofloxacin (OCUFLOX) 0.3 % ophthalmic solution ofloxacin 0.3 % eye drops   OneTouch Delica Lancets 50I MISC See admin instructions.   sodium bicarbonate 650 MG tablet Take 650 mg by mouth 2 (two) times daily.   [DISCONTINUED] diclofenac Sodium (VOLTAREN) 1 % GEL Apply 2 g topically 4 (four) times daily. Apply to left knee.     Allergies:   Cefepime and Lactose intolerance (gi)   Social History   Tobacco Use   Smoking status: Former    Packs/day: 0.25    Years: 43.00    Pack years: 10.75    Types: Cigarettes    Start date: 04/1962    Quit date: 04/2005    Years since quitting: 16.4   Smokeless tobacco: Never  Vaping Use   Vaping Use: Never used  Substance Use Topics   Alcohol use: Not Currently    Comment: occasionally   Drug use: No     Family Hx: The patient's family history includes Diabetes in her brother, father, maternal aunt, maternal grandfather, maternal grandmother, maternal uncle, mother, and sister; Healthy in her daughter, son, and son; Heart disease in her father and mother; Hypertension in her brother and mother; Sickle cell anemia in her brother, sister, and sister; Stroke in her mother.  ROS:   Please see the history of present illness.    Review of Systems  Constitutional: Negative.   Respiratory: Negative.  Negative for shortness of breath.   Cardiovascular: Negative.   Gastrointestinal: Negative.   Musculoskeletal:        Knee pain  Neurological: Negative.   Psychiatric/Behavioral: Negative.     All other systems reviewed and are negative.   Labs/Other Tests and Data Reviewed:    Recent Labs: 10/03/2020: TSH 1.310 12/20/2020: Platelets 197 08/15/2021: ALT 36; BUN 41; Creatinine, Ser  2.59; Potassium 4.5; Sodium 142 09/06/2021: Hemoglobin 10.3   Recent Lipid Panel Lab Results  Component Value Date/Time   CHOL 162 12/20/2020 12:04 PM   TRIG 90 12/20/2020 12:04 PM   HDL 61 12/20/2020 12:04 PM   CHOLHDL 2.7 12/20/2020 12:04 PM   LDLCALC 84 12/20/2020 12:04 PM    Wt Readings from Last 3 Encounters:  09/07/21 158 lb (71.7 kg)  08/23/21 158 lb 11.7 oz (72 kg)  08/15/21 158 lb 12.8 oz (72 kg)     Exam:    Vital Signs:  There were no vitals taken for this visit.    Physical Exam Vitals reviewed.  Constitutional:  General: She is not in acute distress.    Appearance: Normal appearance.  HENT:     Nose: Nasal deformity (swelling noted) present.  Eyes:     Comments: She has bruising to her left lower eye and upper eye lid.    Pulmonary:     Effort: Pulmonary effort is normal. No respiratory distress.  Musculoskeletal:        General: Swelling (bilateral knees) present. No tenderness.  Neurological:     General: No focal deficit present.     Mental Status: She is alert and oriented to person, place, and time. Mental status is at baseline.     Cranial Nerves: No cranial nerve deficit.  Psychiatric:        Mood and Affect: Mood and affect normal.        Behavior: Behavior normal.        Thought Content: Thought content normal.        Cognition and Memory: Memory normal.        Judgment: Judgment normal.    ASSESSMENT & PLAN:    1. Fall, initial encounter TCM Performed. A member of the clinical team spoke with the patient upon dischare. Discharge summary was reviewed in full detail during the visit. Meds reconciled and compared to discharge meds. Medication list is updated and reviewed with the patient.  Greater than 50% face to face time was spent in counseling an coordination of care.  All questions were answered to the satisfaction of the patient.   She is already receiving physical therapy. Advised to not bend over forward.  2. Closed fracture of  nasal bone with routine healing, subsequent encounter Swelling noted and slight deformity to nose. Will refer to Dr Melvenia Needles to evaluate. Advised if has shortness of breath to go to ER for evaluation - Ambulatory referral to Plastic Surgery  3. Acute pain of both knees Improving, will send rx for diclofenac, there is some swelling noted. If worsens return call to office will refer to orthopedic - diclofenac Sodium (VOLTAREN) 1 % GEL; Apply 2 g topically 4 (four) times daily. Apply to left knee.  Dispense: 50 g; Refill: 0   COVID-19 Education: The signs and symptoms of COVID-19 were discussed with the patient and how to seek care for testing (follow up with PCP or arrange E-visit).  The importance of social distancing was discussed today.  Patient Risk:   After full review of this patients clinical status, I feel that they are at least moderate risk at this time.  Time:   Today, I have spent 12.45 minutes/ seconds with the patient with telehealth technology discussing above diagnoses.     Medication Adjustments/Labs and Tests Ordered: Current medicines are reviewed at length with the patient today.  Concerns regarding medicines are outlined above.   Tests Ordered: Orders Placed This Encounter  Procedures   Ambulatory referral to Plastic Surgery    Medication Changes: Meds ordered this encounter  Medications   diclofenac Sodium (VOLTAREN) 1 % GEL    Sig: Apply 2 g topically 4 (four) times daily. Apply to left knee.    Dispense:  50 g    Refill:  0    Disposition:  Follow up prn  Signed, Minette Brine, FNP

## 2021-09-13 DIAGNOSIS — E785 Hyperlipidemia, unspecified: Secondary | ICD-10-CM | POA: Diagnosis not present

## 2021-09-13 DIAGNOSIS — I129 Hypertensive chronic kidney disease with stage 1 through stage 4 chronic kidney disease, or unspecified chronic kidney disease: Secondary | ICD-10-CM | POA: Diagnosis not present

## 2021-09-13 DIAGNOSIS — E1122 Type 2 diabetes mellitus with diabetic chronic kidney disease: Secondary | ICD-10-CM | POA: Diagnosis not present

## 2021-09-13 DIAGNOSIS — D631 Anemia in chronic kidney disease: Secondary | ICD-10-CM | POA: Diagnosis not present

## 2021-09-13 DIAGNOSIS — I2723 Pulmonary hypertension due to lung diseases and hypoxia: Secondary | ICD-10-CM | POA: Diagnosis not present

## 2021-09-13 DIAGNOSIS — Z9181 History of falling: Secondary | ICD-10-CM | POA: Diagnosis not present

## 2021-09-13 DIAGNOSIS — R2689 Other abnormalities of gait and mobility: Secondary | ICD-10-CM | POA: Diagnosis not present

## 2021-09-13 DIAGNOSIS — I10 Essential (primary) hypertension: Secondary | ICD-10-CM | POA: Diagnosis not present

## 2021-09-13 DIAGNOSIS — M810 Age-related osteoporosis without current pathological fracture: Secondary | ICD-10-CM | POA: Diagnosis not present

## 2021-09-13 DIAGNOSIS — N184 Chronic kidney disease, stage 4 (severe): Secondary | ICD-10-CM | POA: Diagnosis not present

## 2021-09-13 DIAGNOSIS — Z794 Long term (current) use of insulin: Secondary | ICD-10-CM | POA: Diagnosis not present

## 2021-09-18 ENCOUNTER — Telehealth: Payer: Self-pay

## 2021-09-18 DIAGNOSIS — E2839 Other primary ovarian failure: Secondary | ICD-10-CM

## 2021-09-18 NOTE — Telephone Encounter (Signed)
-----  Message from Glendale Chard, MD sent at 09/17/2021  7:05 PM EDT ----- ?Please ask patient if she is wiling to have bone density done? We need to perform b/c of stress fracture. If yes, where does she get mammograms? I will schedule it there.  ? 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 Be sure to tell [facility] as well.  ? ?Thanks ? ?RS ? ?

## 2021-09-18 NOTE — Telephone Encounter (Signed)
The patient's daughter [name] said that the patient is in need of a bone density and that she was supposed to have had one done at Dr. Mancel Bale office but that the machine was down the day of her visit.  She would like to have the referral placed to Dr. Everett Graff. ?

## 2021-09-19 ENCOUNTER — Ambulatory Visit (INDEPENDENT_AMBULATORY_CARE_PROVIDER_SITE_OTHER): Payer: Medicare Other | Admitting: Ophthalmology

## 2021-09-19 ENCOUNTER — Encounter (INDEPENDENT_AMBULATORY_CARE_PROVIDER_SITE_OTHER): Payer: Self-pay | Admitting: Ophthalmology

## 2021-09-19 ENCOUNTER — Other Ambulatory Visit: Payer: Self-pay

## 2021-09-19 DIAGNOSIS — E113551 Type 2 diabetes mellitus with stable proliferative diabetic retinopathy, right eye: Secondary | ICD-10-CM

## 2021-09-19 DIAGNOSIS — H4311 Vitreous hemorrhage, right eye: Secondary | ICD-10-CM

## 2021-09-19 DIAGNOSIS — H3589 Other specified retinal disorders: Secondary | ICD-10-CM

## 2021-09-19 LAB — HM DIABETES EYE EXAM

## 2021-09-19 NOTE — Assessment & Plan Note (Signed)
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

## 2021-09-19 NOTE — Progress Notes (Signed)
09/19/2021     CHIEF COMPLAINT Patient presents for  Chief Complaint  Patient presents with   Retina Follow Up    History of vitreous hemorrhage from nondiabetic retinopathy may possibly made worse by proliferative diabetic retinopathy OD status post vitrectomy 08-14-2021 OD  HISTORY OF PRESENT ILLNESS: Miranda Roman is a 76 y.o. female who presents to the clinic today for:   HPI     Retina Follow Up           Diagnosis: Other   Laterality: right eye         Comments   4 weeks. For dilate ou, color fp oct. Pt states vision has improved. Pt denies FOL but notices a floater in OD. Pt is still taking unlulin to maintain blood sugar. Pt had a fall last Saturday, pt fell face down.         Last edited by Angeline Slim on 09/19/2021  2:46 PM.      Referring physician: Jethro Bolus, MD [address] [place],  [place] [address]  HISTORICAL INFORMATION:   Selected notes from the MEDICAL RECORD NUMBER    Lab Results  Component Value Date   HGBA1C 5.5 08/15/2021     CURRENT MEDICATIONS: Current Outpatient Medications (Ophthalmic Drugs)  Medication Sig   ketorolac (ACULAR) 0.5 % ophthalmic solution ketorolac 0.5 % eye drops   ofloxacin (OCUFLOX) 0.3 % ophthalmic solution ofloxacin 0.3 % eye drops   No current facility-administered medications for this visit. (Ophthalmic Drugs)   Current Outpatient Medications (Other)  Medication Sig   acetaminophen (TYLENOL) 500 MG tablet Take 1 tablet (500 mg total) by mouth every 6 (six) hours as needed for moderate pain or fever.   amLODipine (NORVASC) 2.5 MG tablet Take 2.5 mg by mouth daily.   atorvastatin (LIPITOR) 20 MG tablet TAKE ONE TABLET BY MOUTH M-F AND SKIP WEEKENDS   AZO-CRANBERRY PO Take 1 capsule by mouth daily at 6 (six) AM.   b complex vitamins capsule Take 1 capsule by mouth daily.   BD INSULIN SYRINGE U/F 31G X 5/16" 0.3 ML MISC See admin instructions. use with insulin   calcitRIOL (ROCALTROL) 0.25 MCG  capsule Take 0.25 mcg by mouth daily.   cetirizine (ZYRTEC) 10 MG tablet Take 10 mg by mouth daily.   Cyanocobalamin (VITAMIN B12) 1000 MCG TBCR 1 tablet   diclofenac Sodium (VOLTAREN) 1 % GEL Apply 2 g topically 4 (four) times daily. Apply to left knee.   doxycycline (VIBRA-TABS) 100 MG tablet One  tab po q12 hours on day 1, then one tab po qd   epoetin alfa (PROCRIT) 60454 UNIT/ML injection Every other week   estradiol (ESTRACE) 0.1 MG/GM vaginal cream    gabapentin (NEURONTIN) 100 MG capsule One capsule po qhs   HUMALOG 100 UNIT/ML injection INJECT 4 UNIT(S) SUBCUTANEOUSLY 3 TIMES DAILY (Patient taking differently: once. INJECT 4 UNIT(S) SUBCUTANEOUSLY 3 TIMES DAILY)   insulin detemir (LEVEMIR FLEXTOUCH) 100 UNIT/ML FlexPen 6 Units daily.   Insulin Pen Needle (BD PEN NEEDLE NANO U/F) 32G X 4 MM MISC Use as directed with insulin pen   metoprolol tartrate (LOPRESSOR) 50 MG tablet TAKE 1 TABLET BY MOUTH TWICE A DAY   Multiple Vitamins-Minerals (CENTRUM SILVER PO) Take by mouth daily.   OneTouch Delica Lancets 30G MISC See admin instructions.   sodium bicarbonate 650 MG tablet Take 650 mg by mouth 2 (two) times daily.   No current facility-administered medications for this visit. (Other)  REVIEW OF SYSTEMS: ROS   Negative for: Constitutional, Gastrointestinal, Neurological, Skin, Genitourinary, Musculoskeletal, HENT, Endocrine, Cardiovascular, Eyes, Respiratory, Psychiatric, Allergic/Imm, Heme/Lymph Last edited by Angeline Slim on 09/19/2021  2:46 PM.       ALLERGIES Allergies  Allergen Reactions   Cefepime Other (See Comments)    Causes encephalitis - reported by [facility] 12/31/2019   Lactose Intolerance (Gi)     Per pt's daughter causes gas    PAST MEDICAL HISTORY 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HCC)     Past Surgical History:  Procedure Laterality Date   BONE RESECTION  01/2006   wrist   GASTRIC BYPASS  08/14/2009   OTHER SURGICAL HISTORY     sickle cell retinopathy laser surgery   TUBAL LIGATION  04/28/1977    FAMILY HISTORY Family History  Problem Relation Age of Onset   Diabetes Mother    Heart disease Mother    Hypertension Mother    Stroke Mother    Diabetes Father    Heart disease Father    Diabetes Sister    Diabetes Brother    Hypertension Brother    Sickle cell anemia Brother    Diabetes Maternal Aunt    Diabetes Maternal Uncle    Diabetes Maternal Grandmother    Diabetes Maternal Grandfather    Sickle cell anemia Sister    Sickle cell anemia Sister    Healthy Son    Healthy Son    Healthy Daughter     SOCIAL HISTORY Social History   Tobacco Use   Smoking status: Former    Packs/day: 0.25    Years: 43.00    Pack years: 10.75    Types: Cigarettes    Start date: 04/1962    Quit date: 04/2005    Years since quitting: 16.4   Smokeless tobacco: Never  Vaping Use   Vaping Use: Never used  Substance Use Topics   Alcohol use: Not Currently    Comment: occasionally   Drug use: No         OPHTHALMIC EXAM:  Base Eye Exam     Visual Acuity (ETDRS)       Right Left   Dist [place] 20/100 -2 20/100   Dist ph [place] NI 20/80 -2         Tonometry (Tonopen, 2:55 PM)       Right Left   Pressure 17 16         Pupils       Pupils APD   Right PERRL None   Left PERRL None         Visual Fields       Left Right    Full Full         Extraocular Movement       Right Left    Full Full         Neuro/Psych     Oriented x3: Yes   Mood/Affect: Normal         Dilation     Both eyes: 1.0% Mydriacyl, 2.5% Phenylephrine @ 2:55 PM           Slit Lamp and Fundus Exam     External Exam       Right Left   External Normal Normal  Slit Lamp Exam       Right Left   Lids/Lashes Normal Normal   Conjunctiva/Sclera  White and quiet White and quiet   Cornea Clear Clear   Anterior Chamber Deep and quiet Deep and quiet   Iris Round and reactive Round and reactive   Lens Centered posterior chamber intraocular lens Centered posterior chamber intraocular lens   Anterior Vitreous Normal Normal         Fundus Exam       Right Left   Posterior Vitreous clear avitric Clear media   Disc Normal Normal   C/D Ratio 0.7 0.55   Macula Microaneurysms, no macular thickening Normal   Vessels PDR quiescent Normal   Periphery Periphery attached, good PRP 360 clear media Large superotemporal chorioretinal scar, retina attached            IMAGING AND PROCEDURES  Imaging and Procedures for 09/19/21  OCT, Retina - OU - Both Eyes       Right Eye Central Foveal Thickness: 237. Progression has no prior data.   Left Eye Central Foveal Thickness: 2. Progression has been stable.   Notes OD now with clear media, post vitrectomy for nonclearing vitreous hemorrhage, diffuse macular atrophy is present however, similar to the diffuse macular atrophy in the left eye.  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   Diffuse macular and retinal atrophy accounts for acuity   OS diffuse macular atrophy, no change over time     Color Fundus Photography Optos - OU - Both Eyes       Right Eye Progression has improved. Disc findings include increased cup to disc ratio. Macula : microaneurysms.   Left Eye Progression has no prior data. Disc findings include increased cup to disc ratio.   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 Vitreous hemorrhage of right eye (HCC)  H43.11 OCT, Retina - OU - Both Eyes    Color Fundus Photography Optos - OU - Both Eyes    2. 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 Macular atrophy, retinal  H35.89       1.  OD with Improved acuity post vitrectomy yet still limited due to diffuse retinal atrophy motor to the macula in the retinal findings of the left eye by OCT OU.  These  findings are compatible with her previous Wernicke's encephalopathy due to vitamin deficiency which can lead to similar retinal and optic nerve atrophy and findings and development.  2.  Patient should seek refraction for best possible visual acuity recovery however explained to the patient and family that vision might be limited by the underlying neurological disease and/or deficiency which can permanently affect the macula, optic nerve of each eye  3.  Ophthalmic Meds Ordered this visit:  No orders of the defined types were placed in this encounter.      Return in about 6 months (around 03/22/2022) for COLOR FP, OCT, DILATE OU.  There are no Patient  Instructions on file for this visit.   Explained the diagnoses, plan, and follow up with the patient and they expressed understanding.  Patient expressed understanding of the importance of proper follow up care.   Alford Highland Yanni Quiroa M.D. Diseases & Surgery of the Retina and Vitreous [facility] 09/19/21     Abbreviations: M myopia (nearsighted); A astigmatism; H hyperopia (farsighted); P presbyopia; Mrx spectacle prescription;  CTL contact lenses; OD right eye; OS left eye; OU both eyes  XT exotropia; ET esotropia; PEK punctate epithelial keratitis; PEE punctate epithelial erosions; DES dry eye syndrome; MGD meibomian gland dysfunction; ATs artificial tears; PFAT's preservative free artificial tears; NSC nuclear sclerotic cataract; PSC posterior subcapsular cataract; ERM epi-retinal membrane; PVD posterior vitreous detachment; RD retinal detachment; DM diabetes mellitus; DR diabetic retinopathy; NPDR non-proliferative diabetic retinopathy; PDR proliferative diabetic retinopathy; CSME clinically significant macular edema; DME diabetic macular edema; dbh dot blot hemorrhages; CWS cotton wool spot; POAG primary open angle glaucoma; C/D cup-to-disc ratio; HVF humphrey visual field; GVF goldmann visual field; OCT optical coherence tomography; IOP intraocular pressure; BRVO Branch retinal vein occlusion; CRVO central retinal vein occlusion; CRAO central retinal artery occlusion; BRAO branch retinal artery occlusion; RT retinal tear; SB scleral buckle; PPV pars plana vitrectomy; VH Vitreous hemorrhage; PRP panretinal laser photocoagulation; IVK intravitreal kenalog; VMT vitreomacular traction; MH Macular hole;  NVD neovascularization of the disc; NVE neovascularization elsewhere; AREDS age related eye disease study; ARMD age related macular degeneration; POAG primary open angle glaucoma; EBMD epithelial/anterior basement membrane dystrophy; ACIOL anterior chamber intraocular lens; IOL intraocular  lens; PCIOL posterior chamber intraocular lens; Phaco/IOL phacoemulsification with intraocular lens placement; PRK photorefractive keratectomy; LASIK laser assisted in situ keratomileusis; HTN hypertension; DM diabetes mellitus; COPD chronic obstructive pulmonary disease

## 2021-09-19 NOTE — Assessment & Plan Note (Signed)
Notably diffuse macular atrophy OU, discovered OD only after vitreous hemorrhage have been cleared for surgery ? 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

## 2021-09-19 NOTE — Assessment & Plan Note (Signed)
Likely contributory to PDR and vitreous hemorrhage but also likely has some component of hemoglobinopathy leading to nondiabetic proliferative retinopathy and vitreous hemorrhage this condition is now cleared ?

## 2021-09-20 ENCOUNTER — Encounter (HOSPITAL_COMMUNITY)
Admission: RE | Admit: 2021-09-20 | Discharge: 2021-09-20 | Disposition: A | Discharge status: Home / Self Care | Payer: Medicare Other | Source: Ambulatory Visit | Attending: Nephrology | Admitting: Nephrology

## 2021-09-20 VITALS — BP 123/70 | HR 72 | Temp 97.5°F | Resp 20

## 2021-09-20 DIAGNOSIS — E1122 Type 2 diabetes mellitus with diabetic chronic kidney disease: Secondary | ICD-10-CM | POA: Diagnosis not present

## 2021-09-20 LAB — POCT HEMOGLOBIN-HEMACUE: Hemoglobin: 9.7 g/dL — ABNORMAL LOW (ref 12.0–15.0)

## 2021-09-20 MED ORDER — EPOETIN ALFA-EPBX 10000 UNIT/ML IJ SOLN
10000.0000 [IU] | INTRAMUSCULAR | Status: DC
  Administered 2021-09-20: 10000 [IU] via SUBCUTANEOUS

## 2021-09-20 MED ORDER — EPOETIN ALFA-EPBX 10000 UNIT/ML IJ SOLN
INTRAMUSCULAR | Status: AC
  Filled 2021-09-20: qty 1

## 2021-09-26 LAB — HM DIABETES EYE EXAM

## 2021-09-30 ENCOUNTER — Other Ambulatory Visit: Payer: Self-pay | Admitting: Internal Medicine

## 2021-10-02 ENCOUNTER — Ambulatory Visit (INDEPENDENT_AMBULATORY_CARE_PROVIDER_SITE_OTHER): Payer: Medicare Other | Admitting: Podiatry

## 2021-10-02 ENCOUNTER — Encounter: Payer: Self-pay | Admitting: Podiatry

## 2021-10-02 ENCOUNTER — Other Ambulatory Visit: Payer: Self-pay

## 2021-10-02 DIAGNOSIS — N184 Chronic kidney disease, stage 4 (severe): Secondary | ICD-10-CM

## 2021-10-02 DIAGNOSIS — E1122 Type 2 diabetes mellitus with diabetic chronic kidney disease: Secondary | ICD-10-CM

## 2021-10-02 DIAGNOSIS — M79674 Pain in right toe(s): Secondary | ICD-10-CM

## 2021-10-02 DIAGNOSIS — L84 Corns and callosities: Secondary | ICD-10-CM

## 2021-10-02 DIAGNOSIS — M79675 Pain in left toe(s): Secondary | ICD-10-CM | POA: Diagnosis not present

## 2021-10-02 DIAGNOSIS — B351 Tinea unguium: Secondary | ICD-10-CM

## 2021-10-04 ENCOUNTER — Encounter (HOSPITAL_COMMUNITY): Payer: Medicare Other

## 2021-10-06 NOTE — Progress Notes (Signed)
?  Subjective:  ?Patient ID: Miranda Roman, female    DOB: [birth year],  MRN: [id number] ? ?ELAIN Roman presents to clinic today for at risk foot care. Pt has h/o NIDDM with chronic kidney disease and callus(es) b/l lower extremities and painful thick toenails that are difficult to trim. Painful toenails interfere with ambulation. Aggravating factors include wearing enclosed shoe gear. Pain is relieved with periodic professional debridement. Painful calluses are aggravated when weightbearing with and without shoegear. Pain is relieved with periodic professional debridement. ? ?Her daughter is present during today's visit. States Mom's glucose readings have been in the 90's. Last A1c was 5.5%.  ? ?Daughter states they just purchased Mom [employer] running shoes and she has a Armed forces logistics/support/administrative officer as well. ? ?New problem(s): None.  ? ?PCP is Glendale Chard, MD , and last visit was August 15, 2021. ? ?Allergies  ?Allergen Reactions  ? Cefepime Other (See Comments)  ?  Causes encephalitis - reported by [facility] 12/31/2019  ? Lactose Intolerance (Gi)   ?  Per pt's daughter causes gas  ? ? ?Review of Systems: Negative except as noted in the HPI. ? ?Objective: No changes noted in today's physical examination. ? ?General: Miranda Roman is a pleasant 76 y.o. African American female, in NAD. AAO x 3.  ? ?Vascular:  ?Capillary refill time to digits immediate b/l. Palpable pedal pulses b/l LE. Pedal hair absent. Lower extremity skin temperature gradient within normal limits. ? ?Dermatological:  ?Skin warm and supple b/l lower extremities. No open wounds b/l lower extremities. No interdigital macerations b/l lower extremities. Toenails 1-5 b/l elongated, discolored, dystrophic, thickened, crumbly with subungual debris and tenderness to dorsal palpation. Hyperkeratotic lesion(s) submet head 5 b/l and submet head 1 right foot.  No erythema, no edema, no drainage, no fluctuance. ? ?Musculoskeletal:  ?Normal muscle  strength 5/5 to all lower extremity muscle groups bilaterally. Tailor's bunion deformity noted b/l lower extremities. ? ?Neurological:  ?Protective sensation intact 5/5 intact bilaterally with 10g monofilament b/l. Vibratory sensation intact b/l. ? ?  Latest Ref Rng & Units 08/15/2021  ?  4:15 PM 05/16/2021  ? 12:21 PM 12/20/2020  ? 12:04 PM  ?Hemoglobin A1C  ?Hemoglobin-A1c 4.8 - 5.6 % 5.5   5.1   5.2    ? ?Assessment/Plan: ?1. Pain due to onychomycosis of toenails of both feet   ?2. Callus   ?3. Diabetes mellitus with stage 4 chronic kidney disease ([place])   ?-Patient was evaluated and treated. All patient's and/or POA's questions/concerns answered on today's visit. ?-Daughter just purchased new On [employer] and she has Chartered loss adjuster as well. These should be sufficient for everyday wear and physical therapy sessions if needed. ?-Mycotic toenails 1-5 bilaterally were debrided in length and girth with sterile nail nippers and dremel without incident. ?-Callus(es) submet head 1 right foot and submet head 5 b/l pared utilizing sterile scalpel blade without complication or incident. Total number debrided =3. ?-Patient/POA to call should there be question/concern in the interim.  ? ?Return in about 3 months (around 01/02/2022). ? ?Marzetta Board, DPM  ?

## 2021-10-07 ENCOUNTER — Encounter: Payer: Medicare Other | Admitting: Internal Medicine

## 2021-10-08 ENCOUNTER — Encounter (HOSPITAL_COMMUNITY): Payer: Medicare Other

## 2021-10-14 ENCOUNTER — Ambulatory Visit: Payer: Medicare Other | Admitting: Plastic Surgery

## 2021-10-14 ENCOUNTER — Other Ambulatory Visit: Payer: Self-pay

## 2021-10-14 VITALS — BP 151/78 | HR 72 | Ht 64.5 in | Wt 165.6 lb

## 2021-10-14 DIAGNOSIS — S022XXA Fracture of nasal bones, initial encounter for closed fracture: Secondary | ICD-10-CM

## 2021-10-14 NOTE — Progress Notes (Signed)
? ?Referring Provider ?Glendale Chard, MD ?[address] ?[address] ?[place],  [place] [address]  ? ?CC:  ?Nasal fracture ? ?Miranda Roman is an 76 y.o. female.  ?HPI: 76 year old with nasal fracture who fell 4 weeks ago.  She is seen in the emergency department.  She is referred to plastic surgery for evaluation regarding her nasal fracture.  The family is concerned about her history of encephalopathy 2 years ago and not certain whether they would want to do any surgery unless it was absolutely necessary.  Patient does not note any breathing difficulties ? ?Allergies  ?Allergen Reactions  ? Cefepime Other (See Comments)  ?  Causes encephalitis - reported by [facility] 12/31/2019  ? Lactose Intolerance (Gi)   ?  Per pt's daughter causes gas  ? ? ?Outpatient Encounter Medications as of 10/14/2021  ?Medication Sig  ? acetaminophen (TYLENOL) 500 MG tablet Take 1 tablet (500 mg total) by mouth every 6 (six) hours as needed for moderate pain or fever.  ? amLODipine (NORVASC) 2.5 MG tablet Take 2.5 mg by mouth daily.  ? atorvastatin (LIPITOR) 20 MG tablet TAKE ONE TABLET BY MOUTH MONDAY THRU FRIDAY AND SKIP WEEKENDS  ? AZO-CRANBERRY PO Take 1 capsule by mouth daily at 6 (six) AM.  ? b complex vitamins capsule Take 1 capsule by mouth daily.  ? BD INSULIN SYRINGE U/F 31G X 5/16" 0.3 ML MISC See admin instructions. use with insulin  ? calcitRIOL (ROCALTROL) 0.25 MCG capsule Take 0.25 mcg by mouth daily.  ? cetirizine (ZYRTEC) 10 MG tablet Take 10 mg by mouth daily.  ? Cyanocobalamin (VITAMIN B12) 1000 MCG TBCR 1 tablet  ? diclofenac Sodium (VOLTAREN) 1 % GEL Apply 2 g topically 4 (four) times daily. Apply to left knee.  ? doxycycline (VIBRA-TABS) 100 MG tablet One  tab po q12 hours on day 1, then one tab po qd  ? epoetin alfa (PROCRIT) 60454 UNIT/ML injection Every other week  ? estradiol (ESTRACE) 0.1 MG/GM vaginal cream   ? gabapentin (NEURONTIN) 100 MG capsule One capsule po qhs  ? HUMALOG 100 UNIT/ML injection  INJECT 4 UNIT(S) SUBCUTANEOUSLY 3 TIMES DAILY (Patient taking differently: once. INJECT 4 UNIT(S) SUBCUTANEOUSLY 3 TIMES DAILY)  ? insulin detemir (LEVEMIR FLEXTOUCH) 100 UNIT/ML FlexPen 6 Units daily.  ? Insulin Pen Needle (BD PEN NEEDLE NANO U/F) 32G X 4 MM MISC Use as directed with insulin pen  ? ketorolac (ACULAR) 0.5 % ophthalmic solution ketorolac 0.5 % eye drops  ? metoprolol tartrate (LOPRESSOR) 50 MG tablet TAKE 1 TABLET BY MOUTH TWICE A DAY  ? Multiple Vitamins-Minerals (CENTRUM SILVER PO) Take by mouth daily.  ? ofloxacin (OCUFLOX) 0.3 % ophthalmic solution ofloxacin 0.3 % eye drops  ? OneTouch Delica Lancets 09W MISC See admin instructions.  ? sodium bicarbonate 650 MG tablet Take 650 mg by mouth 2 (two) times daily.  ? ?No facility-administered encounter medications on file as of 10/14/2021.  ?  ? ?Past Medical History:  ?Diagnosis Date  ? Anemia   ? Chronic kidney disease   ? Diabetes mellitus   ? Hyperlipidemia   ? Hypertension   ? Osteoporosis   ? Sickle cell trait ([place])   ? Thalassemia, beta ([place])   ? ? ?Past Surgical History:  ?Procedure Laterality Date  ? BONE RESECTION  01/2006  ? wrist  ? GASTRIC BYPASS  08/14/2009  ? OTHER SURGICAL HISTORY    ? sickle cell retinopathy laser surgery  ? TUBAL LIGATION  04/28/1977  ? ? ?  Family History  ?Problem Relation Age of Onset  ? Diabetes Mother   ? Heart disease Mother   ? Hypertension Mother   ? Stroke Mother   ? Diabetes Father   ? Heart disease Father   ? Diabetes Sister   ? Diabetes Brother   ? Hypertension Brother   ? Sickle cell anemia Brother   ? Diabetes Maternal Aunt   ? Diabetes Maternal Uncle   ? Diabetes Maternal Grandmother   ? Diabetes Maternal Grandfather   ? Sickle cell anemia Sister   ? Sickle cell anemia Sister   ? Healthy Son   ? Healthy Son   ? Healthy Daughter   ? ? ?Social History  ? ?Social History Narrative  ? Right handed  ? Lives with Daughter in one story  ?  ? ?Review of Systems ?General: Denies fevers, chills, weight loss ?CV:  Denies chest pain, shortness of breath, palpitations ? ? ?Physical Exam ? ?  10/14/2021  ?  2:01 PM 09/20/2021  ?  8:04 AM  ?Vitals with BMI  ?Height 5' 4.5"   ?Weight 165 lbs 10 oz   ?BMI 28   ?Systolic 712 787  ?Diastolic 78 70  ?Pulse 72 72  ?  ?General:  No acute distress,  Alert and oriented, Non-Toxic, Normal speech and affect ?HEENT: Patient with some scarring on the nose particular on the right side.  Nose is relatively symmetric ? ?CT scan: Slightly displaced nasal fracture on my evaluation with significant comminution. ? ?Assessment/Plan ?76 year old with slightly displaced nasal fracture, no breathing difficulties.  She does have some scarring that is not noticeable but I do not think that she will require surgery for this.  I can see her back in 6 months to determine whether posttraumatic rhinoplasty could be indicated.  Most likely I do not think she will require surgery. ? ?Lennice Sites ?10/14/2021, 9:46 PM  ? ? ?  ?

## 2021-10-22 ENCOUNTER — Encounter (HOSPITAL_COMMUNITY): Payer: Medicare Other

## 2021-11-04 ENCOUNTER — Ambulatory Visit: Payer: Medicare Other | Admitting: Podiatry

## 2021-11-06 ENCOUNTER — Encounter: Payer: Medicare Other | Admitting: Internal Medicine

## 2021-11-08 ENCOUNTER — Other Ambulatory Visit: Payer: Self-pay

## 2021-11-08 ENCOUNTER — Encounter: Payer: Self-pay | Admitting: Internal Medicine

## 2021-11-08 DIAGNOSIS — Z1211 Encounter for screening for malignant neoplasm of colon: Secondary | ICD-10-CM

## 2021-12-12 ENCOUNTER — Other Ambulatory Visit (INDEPENDENT_AMBULATORY_CARE_PROVIDER_SITE_OTHER): Payer: Medicare Other | Admitting: Internal Medicine

## 2021-12-12 DIAGNOSIS — N184 Chronic kidney disease, stage 4 (severe): Secondary | ICD-10-CM | POA: Diagnosis not present

## 2021-12-12 DIAGNOSIS — D561 Beta thalassemia: Secondary | ICD-10-CM

## 2021-12-12 DIAGNOSIS — Z9181 History of falling: Secondary | ICD-10-CM

## 2021-12-12 DIAGNOSIS — D631 Anemia in chronic kidney disease: Secondary | ICD-10-CM

## 2021-12-12 DIAGNOSIS — I129 Hypertensive chronic kidney disease with stage 1 through stage 4 chronic kidney disease, or unspecified chronic kidney disease: Secondary | ICD-10-CM

## 2021-12-12 DIAGNOSIS — E1122 Type 2 diabetes mellitus with diabetic chronic kidney disease: Secondary | ICD-10-CM

## 2021-12-12 DIAGNOSIS — I2723 Pulmonary hypertension due to lung diseases and hypoxia: Secondary | ICD-10-CM

## 2021-12-12 DIAGNOSIS — M810 Age-related osteoporosis without current pathological fracture: Secondary | ICD-10-CM | POA: Diagnosis not present

## 2021-12-12 DIAGNOSIS — D573 Sickle-cell trait: Secondary | ICD-10-CM

## 2021-12-12 DIAGNOSIS — R2689 Other abnormalities of gait and mobility: Secondary | ICD-10-CM | POA: Diagnosis not present

## 2021-12-12 NOTE — Progress Notes (Signed)
Cc: home health recert  Received home health orders orders from [facility]. Start of care 07/30/21.   Certification and orders from 09/28/21 through 11/26/21 are reviewed, signed and faxed back to home health company.  Need of intermittent skilled services at home: SN, PT  The home health care plan has been established by me and will be reviewed and updated as needed to maximize patient recovery.  I certify that all home health services have been and will be furnished to the patient while under my care.  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

## 2021-12-16 ENCOUNTER — Ambulatory Visit (HOSPITAL_COMMUNITY)
Admission: RE | Admit: 2021-12-16 | Discharge: 2021-12-16 | Disposition: A | Discharge status: Home / Self Care | Payer: Medicare Other | Source: Ambulatory Visit | Attending: Nephrology | Admitting: Nephrology

## 2021-12-16 VITALS — BP 137/69 | HR 71 | Temp 96.4°F | Resp 18

## 2021-12-16 DIAGNOSIS — N184 Chronic kidney disease, stage 4 (severe): Secondary | ICD-10-CM | POA: Diagnosis not present

## 2021-12-16 DIAGNOSIS — E1122 Type 2 diabetes mellitus with diabetic chronic kidney disease: Secondary | ICD-10-CM | POA: Insufficient documentation

## 2021-12-16 LAB — POCT HEMOGLOBIN-HEMACUE: Hemoglobin: 10.1 g/dL — ABNORMAL LOW (ref 12.0–15.0)

## 2021-12-16 LAB — FERRITIN: Ferritin: 139 ng/mL (ref 11–307)

## 2021-12-16 LAB — IRON AND TIBC
Iron: 73 ug/dL (ref 28–170)
Saturation Ratios: 33 % — ABNORMAL HIGH (ref 10.4–31.8)
TIBC: 220 ug/dL — ABNORMAL LOW (ref 250–450)
UIBC: 147 ug/dL

## 2021-12-16 MED ORDER — EPOETIN ALFA-EPBX 10000 UNIT/ML IJ SOLN: 20000.0000 [IU] | INTRAMUSCULAR | Status: DC

## 2021-12-16 MED ORDER — EPOETIN ALFA-EPBX 10000 UNIT/ML IJ SOLN
INTRAMUSCULAR | Status: AC
  Administered 2021-12-16: 20000 [IU] via SUBCUTANEOUS
  Filled 2021-12-16: qty 2

## 2022-01-04 ENCOUNTER — Other Ambulatory Visit (INDEPENDENT_AMBULATORY_CARE_PROVIDER_SITE_OTHER): Payer: Medicare Other | Admitting: Internal Medicine

## 2022-01-04 DIAGNOSIS — I129 Hypertensive chronic kidney disease with stage 1 through stage 4 chronic kidney disease, or unspecified chronic kidney disease: Secondary | ICD-10-CM

## 2022-01-04 DIAGNOSIS — M810 Age-related osteoporosis without current pathological fracture: Secondary | ICD-10-CM | POA: Diagnosis not present

## 2022-01-04 DIAGNOSIS — N184 Chronic kidney disease, stage 4 (severe): Secondary | ICD-10-CM | POA: Diagnosis not present

## 2022-01-04 DIAGNOSIS — R2689 Other abnormalities of gait and mobility: Secondary | ICD-10-CM

## 2022-01-04 DIAGNOSIS — Z9181 History of falling: Secondary | ICD-10-CM

## 2022-01-04 DIAGNOSIS — E1122 Type 2 diabetes mellitus with diabetic chronic kidney disease: Secondary | ICD-10-CM | POA: Diagnosis not present

## 2022-01-04 DIAGNOSIS — D631 Anemia in chronic kidney disease: Secondary | ICD-10-CM | POA: Diagnosis not present

## 2022-01-04 DIAGNOSIS — D561 Beta thalassemia: Secondary | ICD-10-CM

## 2022-01-04 DIAGNOSIS — D573 Sickle-cell trait: Secondary | ICD-10-CM

## 2022-01-04 HISTORY — DX: Anemia in chronic kidney disease: D63.1

## 2022-01-04 NOTE — Progress Notes (Signed)
Cc: home health recert  Received home health orders orders from [facility]. Start of care 07/30/21.   Certification and orders from 09/28/21 through 11/26/21 are reviewed, signed and faxed back to home health company.  Need of intermittent skilled services at home: SN, PT due to gait abnormality, type 2 dm, htn  The home health care plan has been established by me and will be reviewed and updated as needed to maximize patient recovery.  I certify that all home health services have been and will be furnished to the patient while under my care.  Face-to-face encounter in which the need for home health services was established: 07/11/21  Patient is receiving home health services for the following diagnoses: Problem List Items Addressed This Visit       Endocrine   Diabetes mellitus with stage 4 chronic kidney disease ([place])     Musculoskeletal and Integument   Osteoporosis     Genitourinary   Hypertensive nephropathy - Primary     Other   Beta+ thalassaemia (HCC)   Sickle-cell trait ([place])   Anemia of chronic renal failure, stage 4 (severe) ([place])   Other Visit Diagnoses     History of fall       Imbalance       Other abnormalities of gait and mobility            [name], MD

## 2022-01-06 ENCOUNTER — Other Ambulatory Visit: Payer: Self-pay | Admitting: Internal Medicine

## 2022-01-08 ENCOUNTER — Ambulatory Visit: Payer: Medicare Other | Admitting: Podiatry

## 2022-01-12 ENCOUNTER — Other Ambulatory Visit: Payer: Self-pay | Admitting: Internal Medicine

## 2022-01-13 ENCOUNTER — Ambulatory Visit (HOSPITAL_COMMUNITY)
Admission: RE | Admit: 2022-01-13 | Discharge: 2022-01-13 | Disposition: A | Discharge status: Home / Self Care | Payer: Medicare Other | Source: Ambulatory Visit | Attending: Nephrology | Admitting: Nephrology

## 2022-01-13 VITALS — BP 139/77 | HR 75 | Temp 97.7°F | Resp 19

## 2022-01-13 DIAGNOSIS — D631 Anemia in chronic kidney disease: Secondary | ICD-10-CM | POA: Diagnosis not present

## 2022-01-13 DIAGNOSIS — E1122 Type 2 diabetes mellitus with diabetic chronic kidney disease: Secondary | ICD-10-CM | POA: Diagnosis not present

## 2022-01-13 DIAGNOSIS — N184 Chronic kidney disease, stage 4 (severe): Secondary | ICD-10-CM | POA: Diagnosis not present

## 2022-01-13 LAB — IRON AND TIBC
Iron: 64 ug/dL (ref 28–170)
Saturation Ratios: 28 % (ref 10.4–31.8)
TIBC: 231 ug/dL — ABNORMAL LOW (ref 250–450)
UIBC: 167 ug/dL

## 2022-01-13 LAB — FERRITIN: Ferritin: 137 ng/mL (ref 11–307)

## 2022-01-13 LAB — POCT HEMOGLOBIN-HEMACUE: Hemoglobin: 10.7 g/dL — ABNORMAL LOW (ref 12.0–15.0)

## 2022-01-13 MED ORDER — EPOETIN ALFA 20000 UNIT/ML IJ SOLN
INTRAMUSCULAR | Status: AC
  Administered 2022-01-13: 20000 [IU] via SUBCUTANEOUS
  Filled 2022-01-13: qty 1

## 2022-01-13 MED ORDER — EPOETIN ALFA 20000 UNIT/ML IJ SOLN: 20000.0000 [IU] | Freq: Once | INTRAMUSCULAR | Status: AC

## 2022-01-13 MED ORDER — EPOETIN ALFA-EPBX 40000 UNIT/ML IJ SOLN: 20000.0000 [IU] | INTRAMUSCULAR | Status: DC

## 2022-01-22 ENCOUNTER — Encounter: Payer: Self-pay | Admitting: Psychology

## 2022-01-22 DIAGNOSIS — E1142 Type 2 diabetes mellitus with diabetic polyneuropathy: Secondary | ICD-10-CM | POA: Insufficient documentation

## 2022-01-23 ENCOUNTER — Encounter: Payer: Self-pay | Admitting: Psychology

## 2022-01-23 ENCOUNTER — Ambulatory Visit: Payer: Medicare Other | Admitting: Psychology

## 2022-01-23 DIAGNOSIS — F0670 Mild neurocognitive disorder due to known physiological condition without behavioral disturbance: Secondary | ICD-10-CM | POA: Diagnosis not present

## 2022-01-23 DIAGNOSIS — F05 Delirium due to known physiological condition: Secondary | ICD-10-CM

## 2022-01-23 DIAGNOSIS — G9341 Metabolic encephalopathy: Secondary | ICD-10-CM

## 2022-01-23 DIAGNOSIS — R4189 Other symptoms and signs involving cognitive functions and awareness: Secondary | ICD-10-CM

## 2022-01-23 DIAGNOSIS — I6781 Acute cerebrovascular insufficiency: Secondary | ICD-10-CM

## 2022-01-23 HISTORY — DX: Mild neurocognitive disorder due to known physiological condition without behavioral disturbance: F06.70

## 2022-01-23 NOTE — Progress Notes (Signed)
NEUROPSYCHOLOGICAL EVALUATION [place] [place] Department of Neurology  Date of Evaluation: January 23, 2022  Reason for Referral:   Miranda Roman is a 76 y.o. 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   Assessment and Plan:   Clinical Impression(s): Ms. Thammavong's pattern of performance is suggestive of primary impairments surrounding processing speed and verbal fluency. An additional weakness (largely below average normative range) was exhibited across encoding (i.e., learning) and retrieval aspects of verbal memory. She exhibited weakness across an executive task assessing semantic set shifting. However, this likely reflects deficits in verbal fluency as opposed to cognitive flexibility as performance across all other executive tasks was appropriate. Despite a normative weakness across a receptive language task, she understood task instructions well and had no trouble understanding spoken language during interview, suggesting that is may not reflect a true clinical deficit. Performances were appropriate relative to age-matched peers across attention/concentration, verbal reasoning, safety/judgment, auditory confrontation naming, and recognition/consolidation aspects of verbal memory.   Regarding ADLs, much of medication management, financial management, and bill paying responsibilities are completed by her daughter. This was originally performed for convenience in the acute stages of Miranda Roman's encephalopathic illness in 2021. This has continued, at least in part, due to ongoing visual impairments, making an underlying neurologic cause for ADL disruption unclear. Miranda Roman did acknowledge some concerns that she would be able to perform these actions  independently. She does not drive; however, this is due to visual impairment. At the present time, despite some functional dysfunction, I do not feel that neurocognitive performances arise to the severity where a dementia designation is warranted. 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  The etiology of ongoing dysfunction is likely mixed. Per her and her daughter, cognitive dysfunction and decline were first observed following a bout of metabolic encephalopathy in June 2021. While some records suggest a case of Wernicke's encephalopathy, this did not seem to fully align with clinical indicators at the time and the ultimate cause of this illness remains unknown. While admitted to the ED, her daughter reported symptoms which suggested delirium. Individuals with a history of delirium in the setting of metabolic encephalopathy can certainly exhibit cognitive impairment. While these difficulties generally improve and individuals return to their pre-illness baseline, this is not always the case and some impairments can persist. Deficits which persist generally surround processing speed, executive functioning, verbal fluency, and encoding/retrieval aspects of memory. This certainly aligns with Miranda Roman's testing patterns.  In addition to this experience, Miranda Roman has numerous medical comorbidities which can certainly compromise cognitive functioning. This is especially true surrounding cardiovascular ailments, diabetes, and stage four kidney disease. While she did not elevate mood-related questionnaires across the evaluation, she did report mild anxiety and depression surrounding adjustment to physical and cognitive limitations stemming from her illness during interview, which also can influence cognitive functioning. In all likelihood, it is a combination of all these factors which is causing subjective and objective cognitive  dysfunction.   Despite some very mild weaknesses learning and later retrieving verbal information across memory tasks, she performed well across yes/no recognition tasks. This does not suggest rapid forgetting or a memory storage impairment and current performances are not consistent with Alzheimer's disease. At the present time, memory dysfunction may be better  accounted by deficits in processing speed. Behaviorally, she does not display features of Lewy body disease, frontotemporal lobar degeneration, or a more rare parkinsonian presentation. Continued medical monitoring will be important moving forward.  Recommendations: A repeat neuropsychological evaluation in 18 months (or sooner if functional decline is noted) is recommended to assess the trajectory of future cognitive decline should it occur. This will also aid in future efforts towards improved diagnostic clarity.  She could discuss updated neuroimaging (i.e., brain MRI) with her neurologist given that it has been several years since prior scans were completed.   A referral for outpatient speech therapy could also be considered. This could not only assist with deficits surrounding verbal fluency and output, but could also help identify and learn various compensatory strategies to lessen the impact of cognitive dysfunction in her day-to-day life.   Ms. Kable is encouraged to attend to lifestyle factors for brain health (e.g., regular physical exercise, good nutrition habits, regular participation in cognitively-stimulating activities, and general stress management techniques), which are likely to have benefits for both emotional adjustment and cognition. In fact, in addition to promoting good general health, regular exercise incorporating aerobic activities (e.g., brisk walking, jogging, cycling, etc.) has been demonstrated to be a very effective treatment for depression and stress, with similar efficacy rates to both antidepressant medication  and psychotherapy. Optimal control of vascular risk factors (including safe cardiovascular exercise and adherence to dietary recommendations) is encouraged. Continued participation in activities which provide mental stimulation and social interaction is also recommended.   When learning new information, she would benefit from information being broken up into small, manageable pieces. She may also find it helpful to articulate the material in her own words and in a context to promote encoding at the onset of a new task. This material may need to be repeated multiple times to promote encoding.  Memory can also be improved using internal strategies such as rehearsal, repetition, chunking, mnemonics, association, and imagery.   She had difficulty with receptive language across testing, particularly when information was presented in complex sentence structures. Presenting information it in short, concrete sentences will be helpful to ensure her comprehension.  To address problems with processing speed, she may wish to consider:   -Ensuring that she is alerted when essential material or instructions are being presented   -Adjusting the speed at which new information is presented   -Allowing for more time in comprehending, processing, and responding in conversation  Review of Records:   Miranda Roman was seen by [facility] Metta Clines, D.O.) on 06/13/2020 for an evaluation stemming from prior encephalopathy. Briefly, she was admitted to [facility] on 12/21/2019 for chest pain. Earlier that day, she fell into a freshwater pond. She felt fine but developed chest and back pain that evening. VQ scan and dopplers were negative and cardiac workup was overall unremarkable except for moderate pulmonary hypertension. During her hospitalization, she developed sudden onset of encephalopathy in the setting of leukocytosis and fever. ID and neurology were consulted. She underwent brain MRI, EEG, and lumbar  puncture, all of which were unrevealing. She also developed anemia, thrombocytopenia, and transaminitis. TTP was considered and she was treated with 5 rounds of plasmapheresis and steroids without improvement. For infection, she was started on cefepime. She required an NG tube. She was transferred to [facility] where repeat brain MRI, long-term EEG, and lumbar puncture were again negative for infection, autoimmune, or paraneoplastic etiologies. She was treated with a 5 day course of IVIg. Mentation  started to improve and her NG tube was removed. There was suspicion raised surrounding Wernicke's encephalopathy given her history of gastric bypass surgery.   She required PT/OT after discharge. Since her hospitalization, she also reported decreased vision. She saw ophthalmology in October which found no ocular cause for her vision loss. Visual field testing was reportedly full. She reported no improvement in her vision since her hospitalization. She cannot ambulate independently and needs to use a walker.  She most recently followed up with Dr. Tomi Likens on 06/20/2021 for clearance to undergo cataract surgery. Symptoms were otherwise stable. Etiology for cognitive decline remained unclear. Theorized causes (Wernicke's encephalopathy, paraneoplastic syndrome, PRES) have all not fully aligned with clinical data. Ultimately, Miranda Roman was referred for a comprehensive neuropsychological evaluation to characterize her cognitive abilities and to assist with diagnostic clarity and treatment planning.   Head CT on 12/22/2019 was negative. Head CT on 12/30/2019 was negative. Brain MRI on 01/06/2020 was motion degraded. Head CT on 01/12/2020 was negative. Brain MRI on 01/15/2020 revealed mild microvascular ischemic changes but was otherwise unremarkable.  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   Borderline steroid-induced glaucoma, 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   OD, Early elevated intraocular pressure to 28 mm we will discontinue all topical medications from surgery in the right eye today   Bronchiectasis without complication 13/24/4010   Cataract 07/03/2020   Chest pain at rest 01/30/2015   Chronic kidney disease, stage 4 (severe) 12/07/2020   Closed fracture of right tibial plateau 07/17/2021   Cyst of left kidney 03/10/2019   Needs repeated US 08/2019   Dysphagia, neurologic    Elevated d-dimer 12/20/2019   Facet arthritis of lumbar region 02/27/2019   Gastroesophageal reflux disease 08/26/2016   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  Further history patient suffered possible Wernicke's encephalopathy the past, a type of the vitamin deficiencies, thiamine that can also affect the retina if there is significant damage which may not be recoverable.   Osteoporosis 12/15/2018   Overweight with body mass index (BMI) 25.0-29.9 12/15/2018   PAD (peripheral artery disease) 06/06/2019   Pain in right hand  04/27/2020   Pancreatic cyst 06/06/2019   Polyneuropathy due to type 2 diabetes mellitus    Posterior vitreous detachment of left eye 08/05/2021   Proliferative diabetic retinopathy of right eye 08/22/2021   Likely contributory cause of nonclearing vitreous hemorrhage in the right eye now status post PRP likely to resolve in 2 quiescent PDR, post vitrectomy PRP   Pulmonary hypertension 02/04/2021  Right knee pain 07/10/2021   Sickle-cell trait 12/07/2020   Type II diabetes mellitus 03/10/2012   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   Vitreous hemorrhage of right eye 08/05/2021   Vitrectomy PRP 08-14-2021    Past Surgical History:  Procedure Laterality Date   BONE RESECTION  01/2006   wrist   GASTRIC BYPASS  08/14/2009   OTHER SURGICAL HISTORY     sickle cell retinopathy laser surgery   TUBAL LIGATION  04/28/1977    Current Outpatient Medications:    acetaminophen (TYLENOL) 500 MG tablet, Take 1 tablet (500 mg total) by mouth every 6 (six) hours as needed for moderate pain or fever., Disp: 30 tablet, Rfl: 0   amLODipine (NORVASC) 2.5 MG tablet, Take 2.5 mg by mouth daily., Disp: , Rfl:    atorvastatin (LIPITOR) 20 MG tablet, TAKE ONE TABLET BY MOUTH MONDAY THRU FRIDAY AND SKIP WEEKENDS, Disp: 65 tablet, Rfl: 2   AZO-CRANBERRY PO, Take 1 capsule by mouth daily at 6 (six) AM., Disp: , Rfl:    b complex vitamins capsule, Take 1 capsule by mouth daily., Disp: , Rfl:    BD INSULIN SYRINGE U/F 31G X 5/16" 0.3 ML MISC, See admin instructions. use with insulin, Disp: , Rfl:    calcitRIOL (ROCALTROL) 0.25 MCG capsule, Take 0.25 mcg by mouth daily., Disp: , Rfl:    cetirizine (ZYRTEC) 10 MG tablet, Take 10 mg by mouth daily., Disp: , Rfl:    Cyanocobalamin (VITAMIN B12) 1000 MCG TBCR, 1 tablet, Disp: , Rfl:    diclofenac Sodium (VOLTAREN) 1 % GEL, Apply 2 g topically 4 (four) times daily. Apply to left knee., Disp: 50 g, Rfl: 0   doxycycline (VIBRA-TABS) 100 MG  tablet, One  tab po q12 hours on day 1, then one tab po qd, Disp: 7 tablet, Rfl: 0   epoetin alfa (PROCRIT) 75883 UNIT/ML injection, Every other week, Disp: , Rfl:    estradiol (ESTRACE) 0.1 MG/GM vaginal cream, , Disp: , Rfl:    gabapentin (NEURONTIN) 100 MG capsule, One capsule po qhs, Disp: 90 capsule, Rfl: 2   HUMALOG 100 UNIT/ML injection, INJECT 4 UNIT(S) SUBCUTANEOUSLY 3 TIMES DAILY (Patient taking differently: once. INJECT 4 UNIT(S) SUBCUTANEOUSLY 3 TIMES DAILY), Disp: 10 mL, Rfl: 1   insulin detemir (LEVEMIR FLEXTOUCH) 100 UNIT/ML FlexPen, 6 Units daily., Disp: , Rfl:    Insulin Pen Needle (BD PEN NEEDLE NANO U/F) 32G X 4 MM MISC, Use as directed with insulin pen, Disp: 150 each, Rfl: 2   ketorolac (ACULAR) 0.5 % ophthalmic solution, ketorolac 0.5 % eye drops, Disp: , Rfl:    LEVEMIR 100 UNIT/ML injection, INJECT 0.12 MLS (12 UNITS TOTAL) INTO THE SKIN 2 (TWO) TIMES DAILY., Disp: 10 mL, Rfl: 1   metoprolol tartrate (LOPRESSOR) 50 MG tablet, TAKE 1 TABLET BY MOUTH TWICE A DAY, Disp: 180 tablet, Rfl: 1   Multiple Vitamins-Minerals (CENTRUM SILVER PO), Take by mouth daily., Disp: , Rfl:    ofloxacin (OCUFLOX) 0.3 % ophthalmic solution, ofloxacin 0.3 % eye drops, Disp: , Rfl:    OneTouch Delica Lancets 25Q MISC, See admin instructions., Disp: , Rfl:    sodium bicarbonate 650 MG tablet, Take 650 mg by mouth 2 (two) times daily., Disp: , Rfl:   Clinical Interview:   The following information was obtained during a clinical interview with Miranda Roman and her daughter prior to cognitive testing.  Cognitive Symptoms: Decreased short-term memory: Endorsed. Primary examples included her losing her  train of thought frequently, as well as trouble with word finding while speaking. She generally denied difficulties recalling past conversations or names. Her daughter was generally in agreement and did not report frequent instances where her mother may repeat herself or ask repetitive questions.   Decreased long-term memory: Denied. Decreased attention/concentration: Denied. Her daughter reported some instances where her mother might become hyperfocused on a particular topic for an extended period of time.  Reduced processing speed: Denied. Difficulties with executive functions: Endorsed. They reported some trouble with multi-tasking and organization. They denied trouble with impulsivity or any significant personality changes.  Difficulties with emotion regulation: Denied. Difficulties with receptive language: Denied. Difficulties with word finding: Endorsed. Decreased visuoperceptual ability: Denied.  Trajectory of deficits: Per her daughter, difficulties were first observed immediately after her June 2021 hospitalization for unknown encephalopathy and delirium. There has been some improvement over time; however, the aforementioned difficulties have persisted to present day. They both denied observation of progressive cognitive decline.   Difficulties completing ADLs: Somewhat. In the immediate aftermath of her 2021 illness, her daughter took over medication and financial management. This was done primarily so that Ms. Lull could focus on her rehabilitation and have a few less things to worry about. Over time, given persisting visual impairment, her daughter has continued to manage these responsibilities. When asked, Ms. Cashin was unsure if she would be able to perform these actions independently if necessary. She does not drive due to ongoing visual impairment.   Additional Medical History: History of traumatic brain injury/concussion: Denied. History of stroke: Denied. History of seizure activity: Denied. History of known exposure to toxins: Denied. Symptoms of chronic pain: Denied. She did report a more remote history of right hip pain.  Experience of frequent headaches/migraines: Denied. Frequent instances of dizziness/vertigo: Denied.  Sensory changes: As stated above,  visual impairment arose following her 2021 illness and has persisted to present day. Ophthalmology has not yielded an ocular cause for ongoing deficits. She did have cataract surgery in the past. Currently, she reported significant trouble reading and processing visual information. The cause for visual decline remains unknown. Other sensory changes/difficulties (e.g., hearing, taste, smell) were denied.  Balance/coordination difficulties: Endorsed. Difficulties were said to arise since her 2021 illness. She was discharged with PT services and has noted an improvement in stability over time. However, she has not progressed to her pre-illness baseline. Her daughter described her as feeling "wobbly" at times and she ambulates with a walker for added assistance. Her most recent fall was this past March, causing a broken nose. She denied being diagnosed with a concussion or experiencing a loss in consciousness at that time.  Other motor difficulties: Endorsed. She reported some instances of an action tremor in her upper extremities while writing. No resting tremors were reported.  Sleep History: Estimated hours obtained each night: 6-7 hours. Per her daughter, this is more likely 9-10 hours.  Difficulties falling asleep: Denied. Difficulties staying asleep: Denied outside of occasionally waking to use the restroom.  Feels rested and refreshed upon awakening: Endorsed.  History of snoring: Denied. History of waking up gasping for air: Denied. Witnessed breath cessation while asleep: Denied.  History of vivid dreaming: Denied. Excessive movement while asleep: Denied. Instances of acting out her dreams: Denied.  Psychiatric/Behavioral Health History: Depression: When asked about her current mood, she acknowledged some frustration and irritability. Her daughter theorized that this was caused by diminished functioning stemming from her illness. Ms. Riches acknowledged ongoing depressive symptoms from the  time  of her illness. These were described as mild overall. Current or remote suicidal ideation, intent, or plan was denied.  Anxiety: Along with depressive symptoms, she reported some mild generalized anxiety symptoms since her 2021 illness.  Mania: Denied. Trauma History: Denied. Visual/auditory hallucinations: Denied outside of her 2021 illness while she was delirious and encephalopathic.  Delusional thoughts: Denied outside of her 2021 illness while she was delirious and encephalopathic.   Tobacco: Denied. Alcohol: She denied current alcohol consumption as well as a history of alcohol abuse or dependence.  Recreational drugs: Denied.  Family History: Problem Relation Age of Onset   Diabetes Mother    Heart disease Mother    Hypertension Mother    Stroke Mother    Diabetes Father    Heart disease Father    Diabetes Sister    Diabetes Brother    Hypertension Brother    Sickle cell anemia Brother    Diabetes Maternal Aunt    Diabetes Maternal Uncle    Diabetes Maternal Grandmother    Diabetes Maternal Grandfather    Sickle cell anemia Sister    Sickle cell anemia Sister    Healthy Son    Healthy Son    Healthy Daughter    This information was confirmed by Miranda Roman Del.  Academic/Vocational History: Highest level of educational attainment: 16 years. She graduated from high school and earned a Dietitian in Therapist, occupational. She described herself as an average (A/B/C) student in academic settings. However, she did report skipping the later half of the 6th grade. She noted chemistry likely representing a relative weakness.  History of developmental delay: Denied. History of grade repetition: Denied. Enrollment in special education courses: Denied. History of LD/ADHD: Denied.  Employment: Retired. She previously worked in the Audiological scientist, Brewing technologist positions.   Evaluation Results:   Behavioral Observations: Ms. Sinquefield was accompanied by her  daughter, arrived to her appointment on time, and was appropriately dressed and groomed. She appeared alert. She ambulated slowly and with the assistance of a rolling walker. She was generally able to maneuver this device well and no frank episodes of instability were observed. Gross motor functioning appeared intact upon informal observation and no abnormal movements (e.g., tremors) were noted. Her affect was generally relaxed and positive, but did range appropriately given the subject being discussed during the clinical interview or the task at hand during testing procedures. Spontaneous speech was fluent and word finding difficulties were not observed during the clinical interview. Thought processes were coherent, organized, and normal in content. Insight into her cognitive difficulties appeared adequate.   Given her reported visual impairment, the testing battery was designed to eliminate visual requirements. Due to this, visuospatial abilities were unable to be assessed. Questionnaires were read aloud. During testing, sustained attention was appropriate. Task engagement was adequate and she persisted when challenged. Overall, Miranda Roman was cooperative with the clinical interview and subsequent testing procedures.   Adequacy of Effort: The validity of neuropsychological testing is limited by the extent to which the individual being tested may be assumed to have exerted adequate effort during testing. Miranda Roman expressed her intention to perform to the best of her abilities and exhibited adequate task engagement and persistence. Scores across stand-alone and embedded performance validity measures were within expectation. As such, the results of the current evaluation are believed to be a valid representation of Miranda Roman's current cognitive functioning.  Test Results: Miranda Roman was largely oriented at the time of the current evaluation. However, she  was unable to provide her phone number.    Intellectual abilities based upon educational and vocational attainment were estimated to be in the average range. A combination of subtests used to estimate premorbid intellectual abilities scored in the below average to average normative ranges.    Processing speed was exceptionally low across a oral counting task. Basic attention was above average. More complex attention (e.g., working memory) was average to above average. Executive functioning was variable but largely average. She did perform in the exceptionally low to well below average range across a semantic set shifting task; however, this likely better reflects deficits in verbal fluency rather than cognitive flexibility. She scored in the average range across a task assessing safety and judgment.  Assessed receptive language abilities were well below average, suggesting some trouble understanding more complex sentence structure. Despite performance on this task, Miranda Roman did not exhibit any difficulties comprehending task instructions and answered all questions asked of her appropriately during interview. Assessed expressive language was variable. Sentence repetition was average, verbal fluency (both phonemic and semantic) were well below average, and an auditory confrontation naming task was within normal limits.    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    Learning (i.e., encoding) of novel verbal information was below average. Spontaneous delayed recall (i.e., retrieval) of previously learned information was well below average to below average. Retention rates were 50% across a story learning task and 33% (raw score of 2) across a list learning task. Performance across recognition tasks was below average to average, suggesting evidence for information consolidation.   Results of emotional screening instruments suggested that recent symptoms of generalized anxiety were in  the minimal range, while symptoms of depression were within normal limits. A screening instrument assessing recent sleep quality suggested the presence of minimal sleep dysfunction.  Tables of Scores:   Note: This summary of test scores accompanies the interpretive report and should not be considered in isolation without reference to the appropriate sections in the text. Descriptors are based on appropriate normative data and may be adjusted based on clinical judgment. Terms such as "Within Normal Limits" and "Outside Normal Limits" are used when a more specific description of the test score cannot be determined.       Percentile - Normative Descriptor > 98 - Exceptionally High 91-97 - Well Above Average 75-90 - Above Average 25-74 - Average 9-24 - Below Average 2-8 - Well Below Average < 2 - Exceptionally Low       Orientation:      Raw Score Percentile   NAB Orientation, Form 1 26/29 --- ---       Intellectual Functioning:      Standard Score Percentile   Barona Formula Estimated Premorbid IQ: 103 58 Average       Wechsler Adult Intelligence Scale (WAIS-IV):  Standard Score/ Scaled Score Percentile   Verbal Comprehension Index: 89 23 Below Average    Similarities  8 25 Average    Vocabulary 7 16 Below Average    Information  9 37 Average       WAIS-IV Short Form*: Standard Score/ Scaled Score Percentile   Full Scale IQ 95 37 Average    Similarities 8 25 Average    Digit Span 12 75 Above Average    Arithmetic 8 25 Average    Information 9 37 Average  *From [employer] al. (2020)          Memory:     RBANS,  Form A: Scaled Score Percentile     List Learning 6 9 Below Average    List Recall 2/10 10-16 Below Average      List Recognition 17/20 10-16 Below Average    Story Learning 7 16 Below Average    Story Recall 5 5 Well Below Average      Story Recognition 10/12 27-46 Average       Attention/Executive Function:     Oral Trail Making Test (OTMT): Raw Score (Z-Score)  Percentile     Part A 12 secs.,  0 errors (-2.1) 2 Exceptionally Low    Part B 30 secs.,  0 errors (0.59) 72 Average        Scaled Score Percentile   RBANS Digit Span: 12 75 Above Average        Standard Score Percentile   WAIS-IV Working Memory Index: 100 50 Average        Scaled Score Percentile   WAIS-IV Digit Span: 12 75 Above Average    Forward 12 75 Above Average    Backward 13 84 Above Average    Sequencing 11 63 Average  WAIS-IV Arithmetic 8 25 Average        Scaled Score Percentile   WAIS-IV Similarities: 8 25 Average       D-KEFS Verbal Fluency Test: Raw Score (Scaled Score) Percentile     Letter Total Correct 19 (5) 5 Well Below Average    Category Total Correct 17 (4) 2 Well Below Average    Category Switching Total Correct 6 (3) 1 Exceptionally Low    Category Switching Accuracy 5 (4) 2 Well Below Average      Total Set Loss Errors 0 (13) 84 Above Average      Total Repetition Errors 2 (11) 63 Average       NAB Executive Functions Module, Form 1: T Score Percentile     Judgment 53 62 Average       Language:     Boston Diagnostic Aphasia Examination (BDAE) Raw Score Percentile     Complex Ideational Material: 8/12 --- Well Below Average        Raw Score Percentile   Sentence Repetition: 16/22 73 Average        Scaled Score Percentile   RBANS Semantic Fluency: 4 2 Well Below Average       Verbal Fluency Test: Raw Score (T Score) Percentile     Phonemic Fluency (FAS) 19 (34) 5 Well Below Average    Animal Fluency 8 (31) 3 Well Below Average         Raw Score Percentile   Auditory Naming Test: 48/50 --- Within Normal Limits       Mood and Personality:      Raw Score Percentile   Beck Depression Inventory - II: 4 --- Within Normal Limits  PROMIS Anxiety Questionnaire: 11 --- None to Slight       Additional Questionnaires:      Raw Score Percentile   PROMIS Sleep Disturbance Questionnaire: 10 --- None to Slight   Informed Consent and  Coding/Compliance:   The current evaluation represents a clinical evaluation for the purposes previously outlined by the referral source and is in no way reflective of a forensic evaluation.   Ms. Woolston was provided with a verbal description of the nature and purpose of the present neuropsychological evaluation. Also reviewed were the foreseeable risks and/or discomforts and benefits of the procedure, limits of confidentiality, and mandatory reporting requirements of this provider.  The patient was given the opportunity to ask questions and receive answers about the evaluation. Oral consent to participate was provided by the patient.   This evaluation was conducted by Christia Reading, Ph.D., ABPP-CN, board certified clinical neuropsychologist. Ms. Crotteau completed a clinical interview with Dr. Melvyn Novas, billed as one unit [phone], and 120 minutes of cognitive testing and scoring, billed as one unit [phone] and three additional units 96139. Psychometrist Milana Kidney, B.S., assisted Dr. Melvyn Novas with test administration and scoring procedures. As a separate and discrete service, Dr. Melvyn Novas spent a total of 160 minutes in interpretation and report writing billed as one unit [phone] and two units 96133.

## 2022-01-23 NOTE — Progress Notes (Signed)
Psychometrician Note   Cognitive testing was administered to Miranda Roman by Milana Kidney, B.S. (psychometrist) under the supervision of Dr. Christia Reading, Ph.D., licensed psychologist on 01/23/2022. [name] did not appear overtly distressed by the testing session per behavioral observation or responses across self-report questionnaires. Rest breaks were offered.    The battery of tests administered was selected by Dr. Christia Reading, Ph.D. with consideration to [name]'s current level of functioning, the nature of her symptoms, emotional and behavioral responses during interview, level of literacy, observed level of motivation/effort, and the nature of the referral question. This battery was communicated to the psychometrist. Communication between Dr. Christia Reading, Ph.D. and the psychometrist was ongoing throughout the evaluation and Dr. Christia Reading, Ph.D. was immediately accessible at all times. Dr. Christia Reading, Ph.D. provided supervision to the psychometrist on the date of this service to the extent necessary to assure the quality of all services provided.    Miranda Roman will return within approximately 1-2 weeks for an interactive feedback session with Dr. Melvyn Novas at which time her test performances, clinical impressions, and treatment recommendations will be reviewed in detail. [name] understands she can contact our office should she require our assistance before this time.  A total of 120 minutes of billable time were spent face-to-face with [name] by the psychometrist. This includes both test administration and scoring time. Billing for these services is reflected in the clinical report generated by Dr. Christia Reading, Ph.D.  This note reflects time spent with the psychometrician and does not include test scores or any clinical interpretations made by Dr. Melvyn Novas. The full report will follow in a separate note.

## 2022-01-24 ENCOUNTER — Encounter: Payer: Self-pay | Admitting: Psychology

## 2022-01-29 ENCOUNTER — Ambulatory Visit (INDEPENDENT_AMBULATORY_CARE_PROVIDER_SITE_OTHER): Payer: Medicare Other | Admitting: Internal Medicine

## 2022-01-29 ENCOUNTER — Encounter: Payer: Self-pay | Admitting: Internal Medicine

## 2022-01-29 ENCOUNTER — Ambulatory Visit (INDEPENDENT_AMBULATORY_CARE_PROVIDER_SITE_OTHER): Payer: Medicare Other

## 2022-01-29 VITALS — BP 124/62 | HR 78 | Temp 98.0°F | Ht 63.8 in | Wt 163.0 lb

## 2022-01-29 DIAGNOSIS — N184 Chronic kidney disease, stage 4 (severe): Secondary | ICD-10-CM

## 2022-01-29 DIAGNOSIS — J479 Bronchiectasis, uncomplicated: Secondary | ICD-10-CM

## 2022-01-29 DIAGNOSIS — E1122 Type 2 diabetes mellitus with diabetic chronic kidney disease: Secondary | ICD-10-CM | POA: Diagnosis not present

## 2022-01-29 DIAGNOSIS — Z1211 Encounter for screening for malignant neoplasm of colon: Secondary | ICD-10-CM

## 2022-01-29 DIAGNOSIS — I129 Hypertensive chronic kidney disease with stage 1 through stage 4 chronic kidney disease, or unspecified chronic kidney disease: Secondary | ICD-10-CM

## 2022-01-29 DIAGNOSIS — Z0000 Encounter for general adult medical examination without abnormal findings: Secondary | ICD-10-CM | POA: Diagnosis not present

## 2022-01-29 DIAGNOSIS — F4323 Adjustment disorder with mixed anxiety and depressed mood: Secondary | ICD-10-CM

## 2022-01-29 NOTE — Addendum Note (Signed)
Addended by: Glenna Durand E on: 01/29/2022 11:29 AM   Modules accepted: Orders

## 2022-01-29 NOTE — Progress Notes (Signed)
Subjective:   Miranda Roman is a 76 y.o. female who presents for Medicare Annual (Subsequent) preventive examination.  Review of Systems     Cardiac Risk Factors include: advanced age (>31mn, >>54women);diabetes mellitus;dyslipidemia;hypertension     Objective:    Today's Vitals   01/29/22 1013  BP: 124/62  Pulse: 78  Temp: 98 F (36.7 C)  TempSrc: Oral  SpO2: 98%  Weight: 163 lb (73.9 kg)  Height: 5' 3.8" (1.621 m)   Body mass index is 28.15 kg/m.     01/29/2022   10:30 AM 09/07/2021   10:44 PM 06/20/2021    8:58 AM 12/20/2020   10:49 AM 04/04/2020    4:47 PM 01/13/2020    4:45 PM 09/28/2019    9:42 AM  Advanced Directives  Does Patient Have a Medical Advance Directive? _0  Unable to assess, patient is non-responsive or altered mental status Yes  Type of AScientist, forensicPower of AArcolaLiving will Living will;Healthcare Power of [place]phyrhillsOut of facility DNR (pink MOST or yellow form);Living will HLacombLiving will Healthcare Power of AVan Burenin Chart? No - copy requested No - copy requested  No - copy requested   No - copy requested    Current Medications (verified) Outpatient Encounter Medications as of 01/29/2022  Medication Sig   acetaminophen (TYLENOL) 500 MG tablet Take 1 tablet (500 mg total) by mouth every 6 (six) hours as needed for moderate pain or fever.   amLODipine (NORVASC) 2.5 MG tablet Take 2.5 mg by mouth daily.   atorvastatin (LIPITOR) 20 MG tablet TAKE ONE TABLET BY MOUTH MONDAY THRU FRIDAY AND SKIP WEEKENDS   AZO-CRANBERRY PO Take 1 capsule by mouth daily at 6 (six) AM.   b complex vitamins capsule Take 1 capsule by mouth daily.   BD INSULIN SYRINGE U/F 31G X 5/16" 0.3 ML MISC See admin instructions. use with insulin   calcitRIOL (ROCALTROL) 0.25 MCG capsule Take 0.25 mcg by mouth daily.   cetirizine  (ZYRTEC) 10 MG tablet Take 10 mg by mouth daily.   Cyanocobalamin (VITAMIN B12) 1000 MCG TBCR 1 tablet   diclofenac Sodium (VOLTAREN) 1 % GEL Apply 2 g topically 4 (four) times daily. Apply to left knee.   doxycycline (VIBRA-TABS) 100 MG tablet One  tab po q12 hours on day 1, then one tab po qd   epoetin alfa (PROCRIT) 170623UNIT/ML injection Every other week   estradiol (ESTRACE) 0.1 MG/GM vaginal cream    gabapentin (NEURONTIN) 100 MG capsule One capsule po qhs   HUMALOG 100 UNIT/ML injection INJECT 4 UNIT(S) SUBCUTANEOUSLY 3 TIMES DAILY (Patient taking differently: once. INJECT 4 UNIT(S) SUBCUTANEOUSLY 3 TIMES DAILY)   insulin detemir (LEVEMIR FLEXTOUCH) 100 UNIT/ML FlexPen 6 Units daily.   Insulin Pen Needle (BD PEN NEEDLE NANO U/F) 32G X 4 MM MISC Use as directed with insulin pen   LEVEMIR 100 UNIT/ML injection INJECT 0.12 MLS (12 UNITS TOTAL) INTO THE SKIN 2 (TWO) TIMES DAILY.   metoprolol tartrate (LOPRESSOR) 50 MG tablet TAKE 1 TABLET BY MOUTH TWICE A DAY   Multiple Vitamins-Minerals (CENTRUM SILVER PO) Take by mouth daily.   OneTouch Delica Lancets [id number] See admin instructions.   sodium bicarbonate 650 MG tablet Take 650 mg by mouth 2 (two) times daily.   ketorolac (ACULAR) 0.5 % ophthalmic solution ketorolac 0.5 % eye drops (Patient not taking:  Reported on 01/29/2022)   ofloxacin (OCUFLOX) 0.3 % ophthalmic solution ofloxacin 0.3 % eye drops (Patient not taking: Reported on 01/29/2022)   No facility-administered encounter medications on file as of 01/29/2022.    Allergies (verified) Cefepime and Lactose intolerance (gi)   History: Past Medical History:  Diagnosis Date   Abnormal CT of the chest 12/28/2018   Acute encephalopathy 01/13/2020   Altered mental status    Anemia of chronic renal failure, stage 4 (severe) 01/04/2022   Aortic valve regurgitation 02/11/2016   Atherosclerosis of native coronary artery of native heart without angina pectoris 12/24/2012   Mild,  non-obstructive CAD by cath in 2007   Atrophic vaginitis 12/15/2018   Atypical squamous cells of undetermined significance on cytologic smear of cervix (ASC-US) 12/15/2018   Benign essential hypertension 12/24/2012   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   Borderline steroid-induced glaucoma, 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   OD, Early elevated intraocular pressure to 28 mm we will discontinue all topical medications from surgery in the right eye today   Bronchiectasis without complication 22/48/2500   Cataract 07/03/2020   Chest pain at rest 01/30/2015   Chronic kidney disease, stage 4 (severe) 12/07/2020   Closed fracture of right tibial plateau 07/17/2021   Cyst of left kidney 03/10/2019   Needs repeated US 08/2019   Dysphagia, neurologic    Elevated d-dimer 12/20/2019   Facet arthritis of lumbar region 02/27/2019   Gastroesophageal reflux disease 08/26/2016   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  Further history patient suffered possible Wernicke's encephalopathy the past, a type of the vitamin deficiencies, thiamine that can also affect the retina if there is significant damage which may not be recoverable.   Mild neurocognitive disorder due to multiple etiologies 01/23/2022   Osteoporosis 12/15/2018   Overweight with body mass index (BMI) 25.0-29.9 12/15/2018   PAD (peripheral artery disease) 06/06/2019   Pain in right hand 04/27/2020   Pancreatic cyst 06/06/2019   Polyneuropathy due to type 2 diabetes mellitus    Posterior vitreous  detachment of left eye 08/05/2021   Proliferative diabetic retinopathy of right eye 08/22/2021   Likely contributory cause of nonclearing vitreous hemorrhage in the right eye now status post PRP likely to resolve in 2 quiescent PDR, post vitrectomy PRP   Pulmonary hypertension 02/04/2021   Right knee pain 07/10/2021   Sickle-cell trait 12/07/2020   Type II diabetes mellitus 03/10/2012   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   Vitreous hemorrhage of right eye 08/05/2021   Vitrectomy PRP 08-14-2021   Past Surgical History:  Procedure Laterality Date   BONE RESECTION  01/2006   wrist   GASTRIC BYPASS  08/14/2009   OTHER SURGICAL HISTORY     sickle cell retinopathy laser surgery   TUBAL LIGATION  04/28/1977   Family History  Problem Relation Age of Onset   Diabetes Mother    Heart disease Mother    Hypertension Mother    Stroke Mother    Diabetes Father    Heart disease Father  Diabetes Sister    Diabetes Brother    Hypertension Brother    Sickle cell anemia Brother    Diabetes Maternal Aunt    Diabetes Maternal Uncle    Diabetes Maternal Grandmother    Diabetes Maternal Grandfather    Sickle cell anemia Sister    Sickle cell anemia Sister    Healthy Son    Healthy Son    Healthy Daughter    Social History   Socioeconomic History   Marital status: Widowed    Spouse name: Not on file   Number of children: Not on file   Years of education: 16   Highest education level: Bachelor's degree (e.g., BA, AB, BS)  Occupational History   Occupation: Retired    Comment: banking  Tobacco Use   Smoking status: Former    Packs/day: 0.25    Years: 43.00    Total pack years: 10.75    Types: Cigarettes    Start date: 04/1962    Quit date: 04/2005    Years since quitting: 16.8   Smokeless tobacco: Never  Vaping Use   Vaping Use: Never used  Substance and Sexual Activity   Alcohol use: Not Currently    Comment: occasionally   Drug use: No    Sexual activity: Not Currently  Other Topics Concern   Not on file  Social History Narrative   Right handed   Lives with Daughter in one story   Social Determinants of Health   Financial Resource Strain: Gleason  (01/29/2022)   Overall Financial Resource Strain (CARDIA)    Difficulty of Paying Living Expenses: Not hard at all  Food Insecurity: No Food Insecurity (01/29/2022)   Hunger Vital Sign    Worried About Running Out of Food in the Last Year: Never true    Ran Out of Food in the Last Year: Never true  Transportation Needs: No Transportation Needs (01/29/2022)   PRAPARE - Hydrologist (Medical): No    Lack of Transportation (Non-Medical): No  Physical Activity: Inactive (01/29/2022)   Exercise Vital Sign    Days of Exercise per Week: 0 days    Minutes of Exercise per Session: 0 min  Stress: No Stress Concern Present (01/29/2022)   [place]    Feeling of Stress : Not at all  Social Connections: Not on file    Tobacco Counseling Counseling given: Not Answered   Clinical Intake:  Pre-visit preparation completed: Yes  Pain : No/denies pain     Nutritional Status: BMI 25 -29 Overweight Nutritional Risks: None Diabetes: Yes  How often do you need to have someone help you when you read instructions, pamphlets, or other written materials from your doctor or pharmacy?: 1 - Never What is the last grade level you completed in school?: college  Diabetic? Yes Nutrition Risk Assessment:  Has the patient had any N/V/D within the last 2 months?  No  Does the patient have any non-healing wounds?  No  Has the patient had any unintentional weight loss or weight gain?  No   Diabetes:  Is the patient diabetic?  Yes  If diabetic, was a CBG obtained today?  No  Did the patient bring in their glucometer from home?  No  How often do you monitor your CBG's? daily.   Financial Strains  and Diabetes Management:  Are you having any financial strains with the device, your supplies or your medication? No .  Does the patient want to be seen by Chronic Care Management for management of their diabetes?  No  Would the patient like to be referred to a Nutritionist or for Diabetic Management?  No   Diabetic Exams:  Diabetic Eye Exam: Completed 2023 Diabetic Foot Exam: Overdue, Pt has been advised about the importance in completing this exam. Pt is scheduled for diabetic foot exam on next appointment.   Interpreter Needed?: No  Information entered by :: NAllen LPN   Activities of Daily Living    01/29/2022   10:32 AM  In your present state of health, do you have any difficulty performing the following activities:  Hearing? 0  Vision? 1  Comment some blurriness  Difficulty concentrating or making decisions? 0  Walking or climbing stairs? 1  Dressing or bathing? 1  Doing errands, shopping? 1  Preparing Food and eating ? Y  Using the Toilet? N  In the past six months, have you accidently leaked urine? Y  Do you have problems with loss of bowel control? N  Managing your Medications? Y  Managing your Finances? Y  Housekeeping or managing your Housekeeping? Y    Patient Care Team: Glendale Chard, MD as PCP - General (Internal Medicine) Foye Spurling, MD (Inactive) as Consulting Physician (Endocrinology) Pixie Casino, MD as Consulting Physician (Cardiology) [name], MD as Consulting Physician (General Surgery) Rutherford Guys, MD as Consulting Physician (Ophthalmology) Pieter Partridge, DO as Consulting Physician (Neurology)  Indicate any recent Medical Services you may have received from other than [facility] providers in the past year (date may be approximate).     Assessment:   This is a routine wellness examination for Amari.  Hearing/Vision screen Vision Screening - Comments:: Regular eye exams, Dr. Gershon Crane  Dietary issues and exercise activities  discussed: Current Exercise Habits: The patient does not participate in regular exercise at present   Goals Addressed             This Visit's Progress    Patient Stated       01/29/2022, wants diabetes to go away and get around with ease       Depression Screen    01/29/2022   10:32 AM 12/20/2020   10:50 AM 10/03/2020   11:42 AM 09/28/2019    9:43 AM 03/03/2019   12:14 PM 09/16/2018   11:10 AM 08/24/2018    2:48 PM  PHQ 2/9 Scores  PHQ - 2 Score 0 0 0 0 0 0 0  PHQ- 9 Score    0  0     Fall Risk    01/29/2022   10:31 AM 09/12/2021   10:31 AM 06/20/2021    8:57 AM 12/20/2020   10:49 AM 10/03/2020   11:41 AM  Fall Risk   Falls in the past year? _0 Comment slipped   loses balance   Number falls in past yr: 0 _1 Injury with Fall? 1 1 0 0 1  Comment broke nose      Risk for fall due to : Impaired balance/gait;Impaired mobility;Medication side effect   Impaired balance/gait;Impaired mobility;Medication side effect   Follow up Falls evaluation completed;Education provided;Falls prevention discussed   Falls evaluation completed;Education provided;Falls prevention discussed     FALL RISK PREVENTION PERTAINING TO THE HOME:  Any stairs in or around the home? No  If so, are there any without handrails? N/a Home free of loose throw rugs in walkways, pet beds,  electrical cords, etc? Yes  Adequate lighting in your home to reduce risk of falls? Yes   ASSISTIVE DEVICES UTILIZED TO PREVENT FALLS:  Life alert? No  Use of a cane, walker or w/c? Yes  Grab bars in the bathroom? Yes  Shower chair or bench in shower? Yes  Elevated toilet seat or a handicapped toilet? Yes   TIMED UP AND GO:  Was the test performed? No .    Gait slow and steady with assistive device  Cognitive Function:        01/29/2022   10:33 AM 12/20/2020   10:53 AM 09/28/2019    9:48 AM 09/16/2018   11:13 AM  6CIT Screen  What Year? 0 points 0 points 0 points 0 points  What month? 0 points 0  points 0 points 0 points  What time? 0 points 0 points 0 points 0 points  Count back from 20 0 points 0 points 0 points 0 points  Months in reverse 0 points 0 points 0 points 0 points  Repeat phrase 6 points 2 points 0 points 0 points  Total Score 6 points 2 points 0 points 0 points    Immunizations Immunization History  Administered Date(s) Administered   Fluad Quad(high Dose 65+) 06/11/2020, 05/16/2021   Influenza Split 04/06/2019   Influenza-Unspecified 03/16/2019   PFIZER(Purple Top)SARS-COV-2 Vaccination 07/29/2019, 08/19/2019, 05/04/2020   Pfizer Covid-19 Vaccine Bivalent Booster 86yr & up 05/26/2021   Pneumococcal Conjugate-13 06/10/2019   Pneumococcal Polysaccharide-23 07/09/2016   Tdap 09/07/2021   Zoster, Live 07/29/2013    TDAP status: Up to date  Flu Vaccine status: Up to date  Pneumococcal vaccine status: Up to date  Covid-19 vaccine status: Completed vaccines  Qualifies for Shingles Vaccine? Yes   Zostavax completed Yes   Shingrix Completed?: No.    Education has been provided regarding the importance of this vaccine. Patient has been advised to call insurance company to determine out of pocket expense if they have not yet received this vaccine. Advised may also receive vaccine at local pharmacy or Health Dept. Verbalized acceptance and understanding.  Screening Tests Health Maintenance  Topic Date Due   Zoster Vaccines- Shingrix (1 of 2) Never done   COLONOSCOPY (Pts 45-464yrInsurance coverage will need to be confirmed)  01/20/2019   MAMMOGRAM  02/03/2021   OPHTHALMOLOGY EXAM  03/20/2021   FOOT EXAM  10/03/2021   URINE MICROALBUMIN  10/03/2021   INFLUENZA VACCINE  02/04/2022   HEMOGLOBIN A1C  02/12/2022   PAP SMEAR-Modifier  07/02/2022   TETANUS/TDAP  09/08/2031   Pneumonia Vaccine 6562Years old  Completed   DEXA SCAN  Completed   COVID-19 Vaccine  Completed   Hepatitis C Screening  Completed   HPV VACCINES  Aged Out    Health  Maintenance  Health Maintenance Due  Topic Date Due   Zoster Vaccines- Shingrix (1 of 2) Never done   COLONOSCOPY (Pts [id number] coverage will need to be confirmed)  01/20/2019   MAMMOGRAM  02/03/2021   OPHTHALMOLOGY EXAM  03/20/2021   FOOT EXAM  10/03/2021   URINE MICROALBUMIN  10/03/2021    Colorectal cancer screening: due   Mammogram status: requesting report  Bone Density status: Completed 04/06/2019.   Lung Cancer Screening: (Low Dose CT Chest recommended if Age 20-33-80ars, 30 pack-year currently smoking OR have quit w/in 15years.) does not qualify.   Lung Cancer Screening Referral: no  Additional Screening:  Hepatitis C Screening: does qualify; Completed 02/08/2020  Vision Screening: Recommended  annual ophthalmology exams for early detection of glaucoma and other disorders of the eye. Is the patient up to date with their annual eye exam?  Yes  Who is the provider or what is the name of the office in which the patient attends annual eye exams? Dr. Gershon Crane If pt is not established with a provider, would they like to be referred to a provider to establish care? No .   Dental Screening: Recommended annual dental exams for proper oral hygiene  Community Resource Referral / Chronic Care Management: CRR required this visit?  No   CCM required this visit?  No      Plan:     I have personally reviewed and noted the following in the patient's chart:   Medical and social history Use of alcohol, tobacco or illicit drugs  Current medications and supplements including opioid prescriptions.  Functional ability and status Nutritional status Physical activity Advanced directives List of other physicians Hospitalizations, surgeries, and ER visits in previous 12 months Vitals Screenings to include cognitive, depression, and falls Referrals and appointments  In addition, I have reviewed and discussed with patient certain preventive protocols, quality metrics, and  best practice recommendations. A written personalized care plan for preventive services as well as general preventive health recommendations were provided to patient.     Kellie Simmering, LPN   [id number]   Nurse Notes: none

## 2022-01-29 NOTE — Progress Notes (Signed)
Rich Brave [place],acting as a Education administrator for [name], MD.,have documented all relevant documentation on the behalf of [name], MD,as directed by  [name], MD while in the presence of [name], MD.    Subjective:     Patient ID: Miranda Roman , female    DOB: 1946-02-10 , 76 y.o.   MRN: [id number]   Chief Complaint  Patient presents with   Diabetes   Hypertension    HPI  Patient is here today for diabetes and bp check. She states her sugars are between 95-109.  She reports compliance with meds. She is accompanied by her granddaughter today. She denies headaches, chest pain and shortness of breath.   Diabetes She presents for her follow-up diabetic visit. She has type 2 diabetes mellitus. Her disease course has been stable. There are no hypoglycemic associated symptoms. Pertinent negatives for hypoglycemia include no dizziness. There are no diabetic associated symptoms. Pertinent negatives for diabetes include no blurred vision, no chest pain, no polydipsia, no polyphagia, no polyuria and no weakness. There are no hypoglycemic complications. Diabetic complications include nephropathy. Risk factors for coronary artery disease include diabetes mellitus, dyslipidemia, hypertension, sedentary lifestyle and post-menopausal. Current diabetic treatment includes insulin injections. She is compliant with treatment most of the time. She is following a diabetic diet. Meal planning includes avoidance of concentrated sweets.  Hypertension This is a chronic problem. The current episode started more than 1 year ago. The problem has been gradually improving since onset. Pertinent negatives include no blurred vision, chest pain, palpitations or shortness of breath. The current treatment provides moderate improvement.     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   Borderline steroid-induced glaucoma, 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   OD, Early elevated intraocular pressure to 28 mm we will discontinue all topical medications from surgery in the right eye today   Bronchiectasis without complication 34/19/6222   Cataract 07/03/2020   Chest pain at rest 01/30/2015   Chronic kidney disease, stage 4 (severe) 12/07/2020   Closed fracture of right tibial plateau 07/17/2021   Cyst of left kidney 03/10/2019   Needs repeated US 08/2019   Dysphagia, neurologic    Elevated d-dimer 12/20/2019   Facet arthritis of lumbar region 02/27/2019   Gastroesophageal reflux disease 08/26/2016   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  Further history patient suffered possible Wernicke's encephalopathy the past, a type of the vitamin deficiencies, thiamine that can also affect the retina if there is significant damage which may not  be recoverable.   Mild neurocognitive disorder due to multiple etiologies 01/23/2022   Osteoporosis 12/15/2018   Overweight with body mass index (BMI) 25.0-29.9  12/15/2018   PAD (peripheral artery disease) 06/06/2019   Pain in right hand 04/27/2020   Pancreatic cyst 06/06/2019   Polyneuropathy due to type 2 diabetes mellitus    Posterior vitreous detachment of left eye 08/05/2021   Proliferative diabetic retinopathy of right eye 08/22/2021   Likely contributory cause of nonclearing vitreous hemorrhage in the right eye now status post PRP likely to resolve in 2 quiescent PDR, post vitrectomy PRP   Pulmonary hypertension 02/04/2021   Right knee pain 07/10/2021   Sickle-cell trait 12/07/2020   Type II diabetes mellitus 03/10/2012   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   Vitreous hemorrhage of right eye 08/05/2021   Vitrectomy PRP 08-14-2021     Family History  Problem Relation Age of Onset   Diabetes Mother    Heart disease Mother    Hypertension Mother    Stroke Mother    Diabetes Father    Heart disease Father    Diabetes Sister    Diabetes Brother    Hypertension Brother    Sickle cell anemia Brother    Diabetes Maternal Aunt    Diabetes Maternal Uncle    Diabetes Maternal Grandmother    Diabetes Maternal Grandfather    Sickle cell anemia Sister    Sickle cell anemia Sister    Healthy Son    Healthy Son    Healthy Daughter      Current Outpatient Medications:    acetaminophen (TYLENOL) 500 MG tablet, Take 1 tablet (500 mg total) by mouth every 6 (six) hours as needed for moderate pain or fever., Disp: 30 tablet, Rfl: 0   amLODipine (NORVASC) 2.5 MG tablet, Take 2.5 mg by mouth daily., Disp: , Rfl:    atorvastatin (LIPITOR) 20 MG tablet, TAKE ONE TABLET BY MOUTH MONDAY THRU FRIDAY AND SKIP WEEKENDS, Disp: 65 tablet, Rfl: 2   AZO-CRANBERRY PO, Take 1 capsule by mouth daily at 6 (six) AM., Disp: , Rfl:    b complex vitamins capsule, Take 1 capsule by mouth daily., Disp: , Rfl:    BD INSULIN SYRINGE U/F 31G X 5/16" 0.3 ML MISC, See admin instructions. use with insulin, Disp: , Rfl:    calcitRIOL  (ROCALTROL) 0.25 MCG capsule, Take 0.25 mcg by mouth daily., Disp: , Rfl:    cetirizine (ZYRTEC) 10 MG tablet, Take 10 mg by mouth daily., Disp: , Rfl:    Cyanocobalamin (VITAMIN B12) 1000 MCG TBCR, 1 tablet, Disp: , Rfl:    diclofenac Sodium (VOLTAREN) 1 % GEL, Apply 2 g topically 4 (four) times daily. Apply to left knee., Disp: 50 g, Rfl: 0   doxycycline (VIBRA-TABS) 100 MG tablet, One  tab po q12 hours on day 1, then one tab po qd, Disp: 7 tablet, Rfl: 0   epoetin alfa (PROCRIT) 78938 UNIT/ML injection, Every other week, Disp: , Rfl:    estradiol (ESTRACE) 0.1 MG/GM vaginal cream, , Disp: , Rfl:    gabapentin (NEURONTIN) 100 MG capsule, One capsule po qhs, Disp: 90 capsule, Rfl: 2   HUMALOG 100 UNIT/ML injection, INJECT 4 UNIT(S) SUBCUTANEOUSLY 3 TIMES DAILY (Patient taking differently: once. INJECT 4 UNIT(S) SUBCUTANEOUSLY 3 TIMES DAILY), Disp: 10 mL, Rfl: 1   insulin detemir (LEVEMIR FLEXTOUCH) 100 UNIT/ML FlexPen, 6 Units daily., Disp: , Rfl:    Insulin Pen Needle (BD PEN NEEDLE  NANO U/F) 32G X 4 MM MISC, Use as directed with insulin pen, Disp: 150 each, Rfl: 2   ketorolac (ACULAR) 0.5 % ophthalmic solution, ketorolac 0.5 % eye drops (Patient not taking: Reported on 01/29/2022), Disp: , Rfl:    LEVEMIR 100 UNIT/ML injection, INJECT 0.12 MLS (12 UNITS TOTAL) INTO THE SKIN 2 (TWO) TIMES DAILY., Disp: 10 mL, Rfl: 1   metoprolol tartrate (LOPRESSOR) 50 MG tablet, TAKE 1 TABLET BY MOUTH TWICE A DAY, Disp: 180 tablet, Rfl: 1   Multiple Vitamins-Minerals (CENTRUM SILVER PO), Take by mouth daily., Disp: , Rfl:    ofloxacin (OCUFLOX) 0.3 % ophthalmic solution, ofloxacin 0.3 % eye drops (Patient not taking: Reported on 01/29/2022), Disp: , Rfl:    OneTouch Delica Lancets 32P MISC, See admin instructions., Disp: , Rfl:    sodium bicarbonate 650 MG tablet, Take 650 mg by mouth 2 (two) times daily., Disp: , Rfl:    Allergies  Allergen Reactions   Cefepime Other (See Comments)    Causes encephalitis -  reported by [facility] 12/31/2019   Lactose Intolerance (Gi)     Per pt's daughter causes gas     Review of Systems  Constitutional: Negative.   Eyes:  Negative for blurred vision.  Respiratory: Negative.  Negative for shortness of breath.   Cardiovascular: Negative.  Negative for chest pain and palpitations.  Gastrointestinal: Negative.   Endocrine: Negative for polydipsia, polyphagia and polyuria.  Neurological: Negative.  Negative for dizziness and weakness.  Psychiatric/Behavioral: Negative.       Today's Vitals   01/29/22 1110  BP: 124/62  Pulse: 78  Temp: 98 F (36.7 C)  Weight: 163 lb (73.9 kg)  Height: 5' 3.8" (1.621 m)   Body mass index is 28.15 kg/m.   Objective:  Physical Exam Vitals and nursing note reviewed.  Constitutional:      Appearance: Normal appearance.  HENT:     Head: Normocephalic and atraumatic.  Eyes:     Extraocular Movements: Extraocular movements intact.  Cardiovascular:     Rate and Rhythm: Normal rate and regular rhythm.     Heart sounds: Normal heart sounds.  Pulmonary:     Effort: Pulmonary effort is normal.     Breath sounds: Normal breath sounds.  Musculoskeletal:     Cervical back: Normal range of motion.  Skin:    General: Skin is warm.  Neurological:     General: No focal deficit present.     Mental Status: She is alert.  Psychiatric:        Mood and Affect: Mood normal.        Behavior: Behavior normal.       Assessment And Plan:     1. Diabetes mellitus with stage 4 chronic kidney disease ([place]) Comments: Chronic, I will check labs as below. I will refer her to Dr. Melony Overly for mobile podiatry evaluation. She will rto in 4 months for re-evaluation.  - PTH, intact and calcium - Phosphorus - Protein electrophoresis, serum - CMP14+EGFR - Fructosamine - Hemoglobin A1c - Ambulatory referral to Podiatry - CBC no Diff - Lipid panel  2. Hypertensive nephropathy Comments: Chronic, well controlled.  She will c/w  amlodipine 2.54m daily.  - CMP14+EGFR - CBC no Diff - Lipid panel  3. Bronchiectasis without complication ([place] Comments: Chronic, seen on CT scan. Pulmonary evaluation appreciated.  4. Adjustment disorder with mixed anxiety and depressed mood Comments: She agrees to referral for counseling, she prefers virtual modality initially.  - Ambulatory referral  to Psychology  5. Screen for colon cancer Comments: I will refer her to GI for CRC screening.  - Ambulatory referral to Gastroenterology   Patient was given opportunity to ask questions. Patient verbalized understanding of the plan and was able to repeat key elements of the plan. All questions were answered to their satisfaction.   I, [name], MD, have reviewed all documentation for this visit. The documentation on 01/29/22 for the exam, diagnosis, procedures, and orders are all accurate and complete.   IF YOU HAVE BEEN REFERRED TO A SPECIALIST, IT MAY TAKE 1-2 WEEKS TO SCHEDULE/PROCESS THE REFERRAL. IF YOU HAVE NOT HEARD FROM US/SPECIALIST IN TWO WEEKS, PLEASE GIVE [place] A CALL AT [phone].   THE PATIENT IS ENCOURAGED TO PRACTICE SOCIAL DISTANCING DUE TO THE COVID-19 PANDEMIC.

## 2022-01-29 NOTE — Patient Instructions (Signed)

## 2022-01-29 NOTE — Patient Instructions (Signed)
Ms. Hoch , Thank you for taking time to come for your Medicare Wellness Visit. I appreciate your ongoing commitment to your health goals. Please review the following plan we discussed and let me know if I can assist you in the future.   Screening recommendations/referrals: Colonoscopy: due Mammogram: requesting report Bone Density: completed 04/06/2019 Recommended yearly ophthalmology/optometry visit for glaucoma screening and checkup Recommended yearly dental visit for hygiene and checkup  Vaccinations: Influenza vaccine: due 02/04/2022 Pneumococcal vaccine: completed 06/10/2019 Tdap vaccine: completed 09/07/2021 Shingles vaccine: discussed   Covid-19: 05/26/2021, 05/04/2020, 08/19/2019, 07/29/2019  Advanced directives: Please bring a copy of your POA (Power of Attorney) and/or Living Will to your next appointment.   Conditions/risks identified: none  Next appointment: Follow up in one year for your annual wellness visit    Preventive Care 65 Years and Older, Female Preventive care refers to lifestyle choices and visits with your health care provider that can promote health and wellness. What does preventive care include? A yearly physical exam. This is also called an annual well check. Dental exams once or twice a year. Routine eye exams. Ask your health care provider how often you should have your eyes checked. Personal lifestyle choices, including: Daily care of your teeth and gums. Regular physical activity. Eating a healthy diet. Avoiding tobacco and drug use. Limiting alcohol use. Practicing safe sex. Taking low-dose aspirin every day. Taking vitamin and mineral supplements as recommended by your health care provider. What happens during an annual well check? The services and screenings done by your health care provider during your annual well check will depend on your age, overall health, lifestyle risk factors, and family history of disease. Counseling  Your health care  provider may ask you questions about your: Alcohol use. Tobacco use. Drug use. Emotional well-being. Home and relationship well-being. Sexual activity. Eating habits. History of falls. Memory and ability to understand (cognition). Work and work Statistician. Reproductive health. Screening  You may have the following tests or measurements: Height, weight, and BMI. Blood pressure. Lipid and cholesterol levels. These may be checked every 5 years, or more frequently if you are over 24 years old. Skin check. Lung cancer screening. You may have this screening every year starting at age 90 if you have a 30-pack-year history of smoking and currently smoke or have quit within the past 15 years. Fecal occult blood test (FOBT) of the stool. You may have this test every year starting at age 46. Flexible sigmoidoscopy or colonoscopy. You may have a sigmoidoscopy every 5 years or a colonoscopy every 10 years starting at age 52. Hepatitis C blood test. Hepatitis B blood test. Sexually transmitted disease (STD) testing. Diabetes screening. This is done by checking your blood sugar (glucose) after you have not eaten for a while (fasting). You may have this done every 1-3 years. Bone density scan. This is done to screen for osteoporosis. You may have this done starting at age 13. Mammogram. This may be done every 1-2 years. Talk to your health care provider about how often you should have regular mammograms. Talk with your health care provider about your test results, treatment options, and if necessary, the need for more tests. Vaccines  Your health care provider may recommend certain vaccines, such as: Influenza vaccine. This is recommended every year. Tetanus, diphtheria, and acellular pertussis (Tdap, Td) vaccine. You may need a Td booster every 10 years. Zoster vaccine. You may need this after age 74. Pneumococcal 13-valent conjugate (PCV13) vaccine. One dose is recommended  after age  90. Pneumococcal polysaccharide (PPSV23) vaccine. One dose is recommended after age 64. Talk to your health care provider about which screenings and vaccines you need and how often you need them. This information is not intended to replace advice given to you by your health care provider. Make sure you discuss any questions you have with your health care provider. Document Released: 07/20/2015 Document Revised: 03/12/2016 Document Reviewed: 04/24/2015 Elsevier Interactive Patient Education  2017 [employer] Prevention in the Home Falls can cause injuries. They can happen to people of all ages. There are many things you can do to make your home safe and to help prevent falls. What can I do on the outside of my home? Regularly fix the edges of walkways and driveways and fix any cracks. Remove anything that might make you trip as you walk through a door, such as a raised step or threshold. Trim any bushes or trees on the path to your home. Use bright outdoor lighting. Clear any walking paths of anything that might make someone trip, such as rocks or tools. Regularly check to see if handrails are loose or broken. Make sure that both sides of any steps have handrails. Any raised decks and porches should have guardrails on the edges. Have any leaves, snow, or ice cleared regularly. Use sand or salt on walking paths during winter. Clean up any spills in your garage right away. This includes oil or grease spills. What can I do in the bathroom? Use night lights. Install grab bars by the toilet and in the tub and shower. Do not use towel bars as grab bars. Use non-skid mats or decals in the tub or shower. If you need to sit down in the shower, use a plastic, non-slip stool. Keep the floor dry. Clean up any water that spills on the floor as soon as it happens. Remove soap buildup in the tub or shower regularly. Attach bath mats securely with double-sided non-slip rug tape. Do not have throw  rugs and other things on the floor that can make you trip. What can I do in the bedroom? Use night lights. Make sure that you have a light by your bed that is easy to reach. Do not use any sheets or blankets that are too big for your bed. They should not hang down onto the floor. Have a firm chair that has side arms. You can use this for support while you get dressed. Do not have throw rugs and other things on the floor that can make you trip. What can I do in the kitchen? Clean up any spills right away. Avoid walking on wet floors. Keep items that you use a lot in easy-to-reach places. If you need to reach something above you, use a strong step stool that has a grab bar. Keep electrical cords out of the way. Do not use floor polish or wax that makes floors slippery. If you must use wax, use non-skid floor wax. Do not have throw rugs and other things on the floor that can make you trip. What can I do with my stairs? Do not leave any items on the stairs. Make sure that there are handrails on both sides of the stairs and use them. Fix handrails that are broken or loose. Make sure that handrails are as long as the stairways. Check any carpeting to make sure that it is firmly attached to the stairs. Fix any carpet that is loose or worn. Avoid having throw  rugs at the top or bottom of the stairs. If you do have throw rugs, attach them to the floor with carpet tape. Make sure that you have a light switch at the top of the stairs and the bottom of the stairs. If you do not have them, ask someone to add them for you. What else can I do to help prevent falls? Wear shoes that: Do not have high heels. Have rubber bottoms. Are comfortable and fit you well. Are closed at the toe. Do not wear sandals. If you use a stepladder: Make sure that it is fully opened. Do not climb a closed stepladder. Make sure that both sides of the stepladder are locked into place. Ask someone to hold it for you, if  possible. Clearly mark and make sure that you can see: Any grab bars or handrails. First and last steps. Where the edge of each step is. Use tools that help you move around (mobility aids) if they are needed. These include: Canes. Walkers. Scooters. Crutches. Turn on the lights when you go into a dark area. Replace any light bulbs as soon as they burn out. Set up your furniture so you have a clear path. Avoid moving your furniture around. If any of your floors are uneven, fix them. If there are any pets around you, be aware of where they are. Review your medicines with your doctor. Some medicines can make you feel dizzy. This can increase your chance of falling. Ask your doctor what other things that you can do to help prevent falls. This information is not intended to replace advice given to you by your health care provider. Make sure you discuss any questions you have with your health care provider. Document Released: 04/19/2009 Document Revised: 11/29/2015 Document Reviewed: 07/28/2014 Elsevier Interactive Patient Education  2017 [employer].

## 2022-01-30 ENCOUNTER — Ambulatory Visit: Payer: Medicare Other | Admitting: Psychology

## 2022-01-30 DIAGNOSIS — G9341 Metabolic encephalopathy: Secondary | ICD-10-CM | POA: Diagnosis not present

## 2022-01-30 DIAGNOSIS — I6781 Acute cerebrovascular insufficiency: Secondary | ICD-10-CM | POA: Diagnosis not present

## 2022-01-30 DIAGNOSIS — F0670 Mild neurocognitive disorder due to known physiological condition without behavioral disturbance: Secondary | ICD-10-CM

## 2022-01-30 DIAGNOSIS — F05 Delirium due to known physiological condition: Secondary | ICD-10-CM

## 2022-01-30 LAB — MICROALBUMIN / CREATININE URINE RATIO
Creatinine, Urine: 47.1 mg/dL
Microalb/Creat Ratio: 907 mg/g creat — ABNORMAL HIGH (ref 0–29)
Microalbumin, Urine: 427.4 ug/mL

## 2022-01-30 NOTE — Progress Notes (Signed)
   Neuropsychology Feedback Session Miranda Roman. [facility] of Neurology  Reason for Referral:   Miranda Roman is a 76 y.o. 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   Feedback:   [name] completed a comprehensive neuropsychological evaluation on 01/23/2022. Please refer to that encounter for the full report and recommendations. Briefly, results suggested primary impairments surrounding processing speed and verbal fluency. An additional weakness (largely below average normative range) was exhibited across encoding (i.e., learning) and retrieval aspects of verbal memory. The etiology of ongoing dysfunction is likely mixed. Per her and her daughter, cognitive dysfunction and decline were first observed following a bout of metabolic encephalopathy in June 2021. While some records suggest a case of Wernicke's encephalopathy, this did not seem to fully align with clinical indicators at the time and the ultimate cause of this illness remains unknown. While admitted to the ED, her daughter reported symptoms which suggested delirium. Individuals with a history of delirium in the setting of metabolic encephalopathy can certainly exhibit cognitive impairment. While these difficulties generally improve and individuals return to their pre-illness baseline, this is not always the case and some impairments can persist. Deficits which persist generally surround processing speed, executive functioning, verbal fluency, and encoding/retrieval aspects of memory. This certainly aligns with [name]'s testing patterns. In addition to this experience, Ms. Want has numerous medical comorbidities which can certainly compromise cognitive functioning. This is especially true surrounding  cardiovascular ailments, diabetes, and stage four kidney disease. While she did not elevate mood-related questionnaires across the evaluation, she did report mild anxiety and depression surrounding adjustment to physical and cognitive limitations stemming from her illness during interview, which also can influence cognitive functioning. In all likelihood, it is a combination of all these factors which is causing subjective and objective cognitive dysfunction.   [name] was accompanied by family during the current feedback session. Content of the current session focused on the results of her neuropsychological evaluation. [name] was given the opportunity to ask questions and her questions were answered. She was encouraged to reach out should additional questions arise. A copy of her report was provided at the conclusion of the visit.      30 minutes were spent conducting the current feedback session with [name], billed as one unit [phone].

## 2022-02-03 NOTE — Progress Notes (Signed)
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   Vision loss in right eye secondary to vitreous hemorrhage, improved   1  Check MRI of brain without contrast to assess vascular load 2  Order speech/cognitive therapy to help improve processing speed 3  Discuss with PCP regarding restarting psychotherapy for anxiety 4  Continue daily exercises 5  Follow up in 6 months 6.  Plan to repeat neuropsychological evaluation in 18 month.s   Subjective:  Miranda Roman is a 76 year old right-handed female with HTN, diabetes, HLD, sickle cell trait, beta thalassemia and history of gastric bypass who follows up for encephalopathy.  She is accompanied by her daughter who provides collateral history.   UPDATE: In December, she began having trouble seeing out of the right eye.  It looks like strands of hair over her visual field.  No associated ocular or head pain.  She reports history of "floaters" or black spots in the vision of either eye off and on for several years but nothing like this or lasting this long.  Sed rate and CRP were 10 and <1.0 respectively.  MRA of head and carotid ultrasound at the time showed no hemodynamically significant stenosis.  She saw her ophthalmologist, Dr. Nile Riggs, who again noted bilateral cataract but no evidence of retinal artery or venous occlusion.  He was sent to Dr. Luciana Axe who did find a vitreous hemorrhage that was removed.  Vision in that eye improved but continues to have residual vision loss due to the cataract.    Underwent neuropsychological evaluation on 7/20, which revealed mild neurocognitive disorder, primarily deficits with processing speed, which may be related to cerebrovascular disease, chronic kidney disease, anxiety and sequela of prior metabolic encephalopathy  but did not seem consistent with a neurodegenerative disorder such as Alzheimer's, Lewy Body or FTD.       HISTORY: She was admitted to [facility] on 12/21/2019 for chest pain.  Earlier that day, she fell into a freshwater pond.  She felt fine but developed chest and back pain that evening.  VQ scan and dopplers were negative and cardiac workup overall unremarkable except for moderate pulmonary hypertension.  During hospitalization, she developed sudden onset of encephalopathy in setting of leukocytosis and fever.  ID and neurology were consulted.  She underwent MRI of brain, EEG and lumbar puncture which were unrevealing.  She had also developed anemia, thrombocytopenia, transaminitis and acute injury.  TTP was considered and she was treated with 5 rounds of plasmapheresis and steroids without improvement. For infection, she was started on cefepime.  She required NG tube.  She was transferred to [facility] where repeat MRI of brain with and without contrast, long-term EEG and lumbar puncture were negative for infection, autoimmune or paraneoplastic etiologies.  She was treated with 5 day course of IVIg.  Mentation started to improve and NG tube was removed.  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    She required PT/OT after discharge.  Since hospitalization, she reports decreased vision.  She saw Dr. Nile Riggs of ophthalmology in October who found no ocular cause for her vision loss and visual field testing reportedly full.  She reports no improvement in vision since hospitalization.  She now cannot ambulate independently and needs to use a walker.  Workup included: 01/15/2020 MRI BRAIN W WO (personally reviewed):  Mild chronic small vessel disease but otherwise no acute intracranial abnormality.  01/16/2020 CSF:  Cell count 0, protein 49, glucose 124, culture negative, VDRL nonreactive, oligoclonal bands 0, IgG index 0.5,  paraneoplastic/autoimmune encephlaopathy panel (amphlphyasin ab, AGNA-1, ANNA-1/2/3, CRMP-5, PCA, NMDA-R ab, CASPR2, DPPX, GABA, CAD65, GFAP, IgLON5, LGl1, mGlucR1, NIF) negative 01/14/2020 SERUM:  Sed rate 35, CRP 0.7, thyroperoxidase Ab negative, B1 403.4, ammonia 26, Hgb A1c 7.1, WBC 9.8, HGB 8.2, HCT 26.8, PLT 276, Na 144, K 3.7, glucose 281, BUN 40, Cr 2.08, t bili 0.5, ALP 72, AST 23, ALT 63, GFR 27  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   Borderline steroid-induced glaucoma, 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   OD, Early elevated intraocular pressure to 28 mm we will discontinue all topical medications from surgery in the right eye today   Bronchiectasis without complication 02/04/2021   Cataract 07/03/2020   Chest pain at rest 01/30/2015   Chronic kidney disease, stage 4 (severe) 12/07/2020   Closed fracture of right tibial plateau 07/17/2021   Cyst of left kidney 03/10/2019   Needs repeated US 08/2019   Dysphagia, neurologic    Elevated d-dimer 12/20/2019   Facet arthritis of lumbar region 02/27/2019   Gastroesophageal reflux disease 08/26/2016   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   Hyperglycemia due to type 2 diabetes mellitus 12/07/2020   Hyperlipidemia    Hypertensive nephropathy 10/15/2017   Inadequate oral nutritional intake    Leukocytosis 12/22/2019   Long term (current) use of insulin 12/07/2020   Low grade squamous  intraepithelial lesion (LGSIL) on cervicovaginal cytologic smear 12/15/2018   Lumbago 02/27/2019   Macular atrophy, 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  Further history patient suffered possible Wernicke's encephalopathy the past, a type of the vitamin deficiencies, thiamine that can also affect the retina if there is significant damage which may not be recoverable.   Mild neurocognitive disorder due to multiple etiologies 01/23/2022   Osteoporosis 12/15/2018   Overweight with body mass index (BMI) 25.0-29.9 12/15/2018   PAD (peripheral artery disease) 06/06/2019   Pain in right hand 04/27/2020   Pancreatic cyst 06/06/2019   Polyneuropathy due to type 2 diabetes mellitus    Posterior vitreous detachment of left eye 08/05/2021   Proliferative diabetic retinopathy of right eye 08/22/2021   Likely contributory cause of nonclearing vitreous hemorrhage in the right eye now status post PRP likely to resolve in 2 quiescent PDR, post vitrectomy PRP   Pulmonary hypertension 02/04/2021   Right knee pain 07/10/2021   Sickle-cell trait 12/07/2020   Type II diabetes mellitus 03/10/2012   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   Vitreous hemorrhage of right eye 08/05/2021   Vitrectomy PRP 08-14-2021    MEDICATIONS: Current Outpatient Medications on File Prior to Visit  Medication Sig Dispense Refill   acetaminophen (  TYLENOL) 500 MG tablet Take 1 tablet (500 mg total) by mouth every 6 (six) hours as needed for moderate pain or fever. 30 tablet 0   amLODipine (NORVASC) 2.5 MG tablet Take 2.5 mg by mouth daily.     atorvastatin (LIPITOR) 20 MG tablet TAKE ONE TABLET BY MOUTH MONDAY THRU FRIDAY AND SKIP WEEKENDS 65 tablet 2   AZO-CRANBERRY PO Take 1 capsule by mouth daily at 6 (six) AM.     b complex vitamins capsule Take 1 capsule by mouth daily.     BD INSULIN SYRINGE U/F 31G X 5/16" 0.3 ML MISC See  admin instructions. use with insulin     calcitRIOL (ROCALTROL) 0.25 MCG capsule Take 0.25 mcg by mouth daily.     cetirizine (ZYRTEC) 10 MG tablet Take 10 mg by mouth daily.     Cyanocobalamin (VITAMIN B12) 1000 MCG TBCR 1 tablet     diclofenac Sodium (VOLTAREN) 1 % GEL Apply 2 g topically 4 (four) times daily. Apply to left knee. 50 g 0   doxycycline (VIBRA-TABS) 100 MG tablet One  tab po q12 hours on day 1, then one tab po qd 7 tablet 0   epoetin alfa (PROCRIT) 29562 UNIT/ML injection Every other week     estradiol (ESTRACE) 0.1 MG/GM vaginal cream      gabapentin (NEURONTIN) 100 MG capsule One capsule po qhs 90 capsule 2   HUMALOG 100 UNIT/ML injection INJECT 4 UNIT(S) SUBCUTANEOUSLY 3 TIMES DAILY (Patient taking differently: once. INJECT 4 UNIT(S) SUBCUTANEOUSLY 3 TIMES DAILY) 10 mL 1   insulin detemir (LEVEMIR FLEXTOUCH) 100 UNIT/ML FlexPen 6 Units daily.     Insulin Pen Needle (BD PEN NEEDLE NANO U/F) 32G X 4 MM MISC Use as directed with insulin pen 150 each 2   ketorolac (ACULAR) 0.5 % ophthalmic solution ketorolac 0.5 % eye drops (Patient not taking: Reported on 01/29/2022)     LEVEMIR 100 UNIT/ML injection INJECT 0.12 MLS (12 UNITS TOTAL) INTO THE SKIN 2 (TWO) TIMES DAILY. 10 mL 1   metoprolol tartrate (LOPRESSOR) 50 MG tablet TAKE 1 TABLET BY MOUTH TWICE A DAY 180 tablet 1   Multiple Vitamins-Minerals (CENTRUM SILVER PO) Take by mouth daily.     ofloxacin (OCUFLOX) 0.3 % ophthalmic solution ofloxacin 0.3 % eye drops (Patient not taking: Reported on 01/29/2022)     OneTouch Delica Lancets 30G MISC See admin instructions.     sodium bicarbonate 650 MG tablet Take 650 mg by mouth 2 (two) times daily.     No current facility-administered medications on file prior to visit.    ALLERGIES: Allergies  Allergen Reactions   Cefepime Other (See Comments)    Causes encephalitis - reported by [facility] 12/31/2019   Lactose Intolerance (Gi)     Per pt's daughter causes gas    FAMILY  HISTORY: Family History  Problem Relation Age of Onset   Diabetes Mother    Heart disease Mother    Hypertension Mother    Stroke Mother    Diabetes Father    Heart disease Father    Diabetes Sister    Diabetes Brother    Hypertension Brother    Sickle cell anemia Brother    Diabetes Maternal Aunt    Diabetes Maternal Uncle    Diabetes Maternal Grandmother    Diabetes Maternal Grandfather    Sickle cell anemia Sister    Sickle cell anemia Sister    Healthy Son    Healthy Son    Healthy  Daughter       Objective:  Blood pressure 124/60, height 5\' 3"  (1.6 m), weight 162 lb (73.5 kg). General: No acute distress.  Patient appears well-groomed.   Head:  Normocephalic/atraumatic Eyes:  Fundi examined but not visualized Neck: supple, no paraspinal tenderness, full range of motion Heart:  Regular rate and rhythm Lungs:  Clear to auscultation bilaterally Back: No paraspinal tenderness Neurological Exam: alert and oriented to person, place, and time.  Speech fluent and not dysarthric, language intact.  CN II-XII intact. Bulk and tone normal, muscle strength 5/5 throughout.  Sensation to light touch intact.  Deep tendon reflexes absent throughout, toes downgoing.  Finger to nose testing intact.  Gait unsteady, broad-based.  Requires walker.     Shon Millet, DO  CC: Dorothyann Peng, MD

## 2022-02-04 ENCOUNTER — Encounter: Payer: Self-pay | Admitting: Neurology

## 2022-02-04 ENCOUNTER — Ambulatory Visit: Payer: Medicare Other | Admitting: Neurology

## 2022-02-04 VITALS — BP 124/60 | Ht 63.0 in | Wt 162.0 lb

## 2022-02-04 DIAGNOSIS — F0670 Mild neurocognitive disorder due to known physiological condition without behavioral disturbance: Secondary | ICD-10-CM | POA: Diagnosis not present

## 2022-02-04 DIAGNOSIS — H4311 Vitreous hemorrhage, right eye: Secondary | ICD-10-CM

## 2022-02-04 NOTE — Patient Instructions (Signed)
Will check MRI of brain without contrast Will order speech therapy to help with improving cognitive processing speed Discuss with PCP about restarting psychotherapy for anxiety Continue daily exercises Follow up in 6 months Plan for repeat neuropsychological evaluation in 18 months.

## 2022-02-05 LAB — CMP14+EGFR
ALT: 30 IU/L (ref 0–32)
AST: 25 IU/L (ref 0–40)
Albumin/Globulin Ratio: 1.8 (ref 1.2–2.2)
Albumin: 4.1 g/dL (ref 3.8–4.8)
Alkaline Phosphatase: 86 IU/L (ref 44–121)
BUN/Creatinine Ratio: 17 (ref 12–28)
BUN: 41 mg/dL — ABNORMAL HIGH (ref 8–27)
Bilirubin Total: 0.2 mg/dL (ref 0.0–1.2)
CO2: 16 mmol/L — ABNORMAL LOW (ref 20–29)
Calcium: 8.9 mg/dL (ref 8.7–10.3)
Chloride: 109 mmol/L — ABNORMAL HIGH (ref 96–106)
Creatinine, Ser: 2.43 mg/dL — ABNORMAL HIGH (ref 0.57–1.00)
Globulin, Total: 2.3 g/dL (ref 1.5–4.5)
Glucose: 62 mg/dL — ABNORMAL LOW (ref 70–99)
Potassium: 4.8 mmol/L (ref 3.5–5.2)
Sodium: 144 mmol/L (ref 134–144)
Total Protein: 6.4 g/dL (ref 6.0–8.5)
eGFR: 20 mL/min/{1.73_m2} — ABNORMAL LOW (ref >59)

## 2022-02-05 LAB — LIPID PANEL
Chol/HDL Ratio: 2.9 ratio (ref 0.0–4.4)
Cholesterol, Total: 160 mg/dL (ref 100–199)
HDL: 56 mg/dL (ref >39)
LDL Chol Calc (NIH): 85 mg/dL (ref 0–99)
Triglycerides: 108 mg/dL (ref 0–149)
VLDL Cholesterol Cal: 19 mg/dL (ref 5–40)

## 2022-02-05 LAB — CBC
Hematocrit: 34.1 % (ref 34.0–46.6)
Hemoglobin: 10.6 g/dL — ABNORMAL LOW (ref 11.1–15.9)
MCH: 23.6 pg — ABNORMAL LOW (ref 26.6–33.0)
MCHC: 31.1 g/dL — ABNORMAL LOW (ref 31.5–35.7)
MCV: 76 fL — ABNORMAL LOW (ref 79–97)
Platelets: 211 10*3/uL (ref 150–450)
RBC: 4.49 x10E6/uL (ref 3.77–5.28)
RDW: 18.4 % — ABNORMAL HIGH (ref 11.7–15.4)
WBC: 7 10*3/uL (ref 3.4–10.8)

## 2022-02-05 LAB — PROTEIN ELECTROPHORESIS, SERUM
A/G Ratio: 1.1 (ref 0.7–1.7)
Albumin ELP: 3.3 g/dL (ref 2.9–4.4)
Alpha 1: 0.3 g/dL (ref 0.0–0.4)
Alpha 2: 0.7 g/dL (ref 0.4–1.0)
Beta: 1 g/dL (ref 0.7–1.3)
Gamma Globulin: 1.1 g/dL (ref 0.4–1.8)
Globulin, Total: 3.1 g/dL (ref 2.2–3.9)

## 2022-02-05 LAB — PHOSPHORUS: Phosphorus: 4 mg/dL (ref 3.0–4.3)

## 2022-02-05 LAB — PTH, INTACT AND CALCIUM: PTH: 368 pg/mL — ABNORMAL HIGH (ref 15–65)

## 2022-02-05 LAB — HEMOGLOBIN A1C
Est. average glucose Bld gHb Est-mCnc: 111 mg/dL
Hgb A1c MFr Bld: 5.5 % (ref 4.8–5.6)

## 2022-02-05 LAB — FRUCTOSAMINE: Fructosamine: 361 umol/L — ABNORMAL HIGH (ref 0–285)

## 2022-02-06 ENCOUNTER — Encounter: Payer: Self-pay | Admitting: Internal Medicine

## 2022-02-06 DIAGNOSIS — I10 Essential (primary) hypertension: Secondary | ICD-10-CM | POA: Diagnosis not present

## 2022-02-06 DIAGNOSIS — E782 Mixed hyperlipidemia: Secondary | ICD-10-CM | POA: Diagnosis not present

## 2022-02-06 DIAGNOSIS — Z1211 Encounter for screening for malignant neoplasm of colon: Secondary | ICD-10-CM | POA: Diagnosis not present

## 2022-02-06 DIAGNOSIS — K5900 Constipation, unspecified: Secondary | ICD-10-CM | POA: Diagnosis not present

## 2022-02-06 DIAGNOSIS — K5730 Diverticulosis of large intestine without perforation or abscess without bleeding: Secondary | ICD-10-CM | POA: Diagnosis not present

## 2022-02-10 ENCOUNTER — Inpatient Hospital Stay (HOSPITAL_COMMUNITY): Admission: RE | Admit: 2022-02-10 | Payer: Medicare Other | Source: Ambulatory Visit

## 2022-02-10 DIAGNOSIS — N184 Chronic kidney disease, stage 4 (severe): Secondary | ICD-10-CM | POA: Diagnosis not present

## 2022-02-10 DIAGNOSIS — H53131 Sudden visual loss, right eye: Secondary | ICD-10-CM | POA: Diagnosis not present

## 2022-02-10 DIAGNOSIS — E1122 Type 2 diabetes mellitus with diabetic chronic kidney disease: Secondary | ICD-10-CM | POA: Diagnosis not present

## 2022-02-10 DIAGNOSIS — E1165 Type 2 diabetes mellitus with hyperglycemia: Secondary | ICD-10-CM | POA: Diagnosis not present

## 2022-02-10 DIAGNOSIS — E1142 Type 2 diabetes mellitus with diabetic polyneuropathy: Secondary | ICD-10-CM | POA: Diagnosis not present

## 2022-02-10 DIAGNOSIS — F0670 Mild neurocognitive disorder due to known physiological condition without behavioral disturbance: Secondary | ICD-10-CM | POA: Diagnosis not present

## 2022-02-10 DIAGNOSIS — E785 Hyperlipidemia, unspecified: Secondary | ICD-10-CM | POA: Diagnosis not present

## 2022-02-10 DIAGNOSIS — H4311 Vitreous hemorrhage, right eye: Secondary | ICD-10-CM | POA: Diagnosis not present

## 2022-02-10 DIAGNOSIS — I129 Hypertensive chronic kidney disease with stage 1 through stage 4 chronic kidney disease, or unspecified chronic kidney disease: Secondary | ICD-10-CM | POA: Diagnosis not present

## 2022-02-10 DIAGNOSIS — Z794 Long term (current) use of insulin: Secondary | ICD-10-CM | POA: Diagnosis not present

## 2022-02-15 ENCOUNTER — Ambulatory Visit
Admission: RE | Admit: 2022-02-15 | Discharge: 2022-02-15 | Disposition: A | Discharge status: Home / Self Care | Payer: Medicare Other | Source: Ambulatory Visit | Attending: Neurology | Admitting: Neurology

## 2022-02-15 DIAGNOSIS — I6782 Cerebral ischemia: Secondary | ICD-10-CM | POA: Diagnosis not present

## 2022-02-15 DIAGNOSIS — G3184 Mild cognitive impairment, so stated: Secondary | ICD-10-CM | POA: Diagnosis not present

## 2022-02-15 DIAGNOSIS — I639 Cerebral infarction, unspecified: Secondary | ICD-10-CM | POA: Diagnosis not present

## 2022-02-15 DIAGNOSIS — S0990XA Unspecified injury of head, initial encounter: Secondary | ICD-10-CM | POA: Diagnosis not present

## 2022-02-15 DIAGNOSIS — F0670 Mild neurocognitive disorder due to known physiological condition without behavioral disturbance: Secondary | ICD-10-CM

## 2022-02-18 DIAGNOSIS — F0670 Mild neurocognitive disorder due to known physiological condition without behavioral disturbance: Secondary | ICD-10-CM | POA: Diagnosis not present

## 2022-02-18 DIAGNOSIS — E1142 Type 2 diabetes mellitus with diabetic polyneuropathy: Secondary | ICD-10-CM | POA: Diagnosis not present

## 2022-02-18 DIAGNOSIS — Z794 Long term (current) use of insulin: Secondary | ICD-10-CM | POA: Diagnosis not present

## 2022-02-18 DIAGNOSIS — E785 Hyperlipidemia, unspecified: Secondary | ICD-10-CM | POA: Diagnosis not present

## 2022-02-18 DIAGNOSIS — I129 Hypertensive chronic kidney disease with stage 1 through stage 4 chronic kidney disease, or unspecified chronic kidney disease: Secondary | ICD-10-CM | POA: Diagnosis not present

## 2022-02-18 DIAGNOSIS — E1122 Type 2 diabetes mellitus with diabetic chronic kidney disease: Secondary | ICD-10-CM | POA: Diagnosis not present

## 2022-02-18 DIAGNOSIS — H4311 Vitreous hemorrhage, right eye: Secondary | ICD-10-CM | POA: Diagnosis not present

## 2022-02-18 DIAGNOSIS — N184 Chronic kidney disease, stage 4 (severe): Secondary | ICD-10-CM | POA: Diagnosis not present

## 2022-02-18 DIAGNOSIS — E1165 Type 2 diabetes mellitus with hyperglycemia: Secondary | ICD-10-CM | POA: Diagnosis not present

## 2022-02-18 DIAGNOSIS — H53131 Sudden visual loss, right eye: Secondary | ICD-10-CM | POA: Diagnosis not present

## 2022-02-20 ENCOUNTER — Telehealth: Payer: Self-pay

## 2022-02-20 DIAGNOSIS — H53131 Sudden visual loss, right eye: Secondary | ICD-10-CM | POA: Diagnosis not present

## 2022-02-20 DIAGNOSIS — E1142 Type 2 diabetes mellitus with diabetic polyneuropathy: Secondary | ICD-10-CM | POA: Diagnosis not present

## 2022-02-20 DIAGNOSIS — N184 Chronic kidney disease, stage 4 (severe): Secondary | ICD-10-CM | POA: Diagnosis not present

## 2022-02-20 DIAGNOSIS — F0670 Mild neurocognitive disorder due to known physiological condition without behavioral disturbance: Secondary | ICD-10-CM | POA: Diagnosis not present

## 2022-02-20 DIAGNOSIS — E1165 Type 2 diabetes mellitus with hyperglycemia: Secondary | ICD-10-CM | POA: Diagnosis not present

## 2022-02-20 DIAGNOSIS — E1122 Type 2 diabetes mellitus with diabetic chronic kidney disease: Secondary | ICD-10-CM | POA: Diagnosis not present

## 2022-02-20 DIAGNOSIS — H4311 Vitreous hemorrhage, right eye: Secondary | ICD-10-CM | POA: Diagnosis not present

## 2022-02-20 DIAGNOSIS — E785 Hyperlipidemia, unspecified: Secondary | ICD-10-CM | POA: Diagnosis not present

## 2022-02-20 DIAGNOSIS — I129 Hypertensive chronic kidney disease with stage 1 through stage 4 chronic kidney disease, or unspecified chronic kidney disease: Secondary | ICD-10-CM | POA: Diagnosis not present

## 2022-02-20 DIAGNOSIS — Z794 Long term (current) use of insulin: Secondary | ICD-10-CM | POA: Diagnosis not present

## 2022-02-20 NOTE — Telephone Encounter (Signed)
This patient was called and message left to call office several times and call was not returned so detailed message was left.

## 2022-02-22 ENCOUNTER — Other Ambulatory Visit: Payer: Self-pay | Admitting: Internal Medicine

## 2022-03-03 DIAGNOSIS — N184 Chronic kidney disease, stage 4 (severe): Secondary | ICD-10-CM | POA: Diagnosis not present

## 2022-03-03 DIAGNOSIS — E1165 Type 2 diabetes mellitus with hyperglycemia: Secondary | ICD-10-CM | POA: Diagnosis not present

## 2022-03-03 DIAGNOSIS — Z794 Long term (current) use of insulin: Secondary | ICD-10-CM | POA: Diagnosis not present

## 2022-03-03 DIAGNOSIS — E1142 Type 2 diabetes mellitus with diabetic polyneuropathy: Secondary | ICD-10-CM | POA: Diagnosis not present

## 2022-03-03 DIAGNOSIS — H4311 Vitreous hemorrhage, right eye: Secondary | ICD-10-CM | POA: Diagnosis not present

## 2022-03-03 DIAGNOSIS — F0670 Mild neurocognitive disorder due to known physiological condition without behavioral disturbance: Secondary | ICD-10-CM | POA: Diagnosis not present

## 2022-03-03 DIAGNOSIS — H53131 Sudden visual loss, right eye: Secondary | ICD-10-CM | POA: Diagnosis not present

## 2022-03-03 DIAGNOSIS — I129 Hypertensive chronic kidney disease with stage 1 through stage 4 chronic kidney disease, or unspecified chronic kidney disease: Secondary | ICD-10-CM | POA: Diagnosis not present

## 2022-03-03 DIAGNOSIS — E785 Hyperlipidemia, unspecified: Secondary | ICD-10-CM | POA: Diagnosis not present

## 2022-03-03 DIAGNOSIS — E1122 Type 2 diabetes mellitus with diabetic chronic kidney disease: Secondary | ICD-10-CM | POA: Diagnosis not present

## 2022-03-04 ENCOUNTER — Telehealth: Payer: Self-pay | Admitting: Neurology

## 2022-03-04 DIAGNOSIS — E785 Hyperlipidemia, unspecified: Secondary | ICD-10-CM | POA: Diagnosis not present

## 2022-03-04 DIAGNOSIS — I129 Hypertensive chronic kidney disease with stage 1 through stage 4 chronic kidney disease, or unspecified chronic kidney disease: Secondary | ICD-10-CM | POA: Diagnosis not present

## 2022-03-04 DIAGNOSIS — F0670 Mild neurocognitive disorder due to known physiological condition without behavioral disturbance: Secondary | ICD-10-CM | POA: Diagnosis not present

## 2022-03-04 DIAGNOSIS — Z794 Long term (current) use of insulin: Secondary | ICD-10-CM | POA: Diagnosis not present

## 2022-03-04 DIAGNOSIS — N184 Chronic kidney disease, stage 4 (severe): Secondary | ICD-10-CM | POA: Diagnosis not present

## 2022-03-04 DIAGNOSIS — E1122 Type 2 diabetes mellitus with diabetic chronic kidney disease: Secondary | ICD-10-CM | POA: Diagnosis not present

## 2022-03-04 DIAGNOSIS — H53131 Sudden visual loss, right eye: Secondary | ICD-10-CM | POA: Diagnosis not present

## 2022-03-04 DIAGNOSIS — E1165 Type 2 diabetes mellitus with hyperglycemia: Secondary | ICD-10-CM | POA: Diagnosis not present

## 2022-03-04 DIAGNOSIS — E1142 Type 2 diabetes mellitus with diabetic polyneuropathy: Secondary | ICD-10-CM | POA: Diagnosis not present

## 2022-03-04 DIAGNOSIS — H4311 Vitreous hemorrhage, right eye: Secondary | ICD-10-CM | POA: Diagnosis not present

## 2022-03-04 NOTE — Telephone Encounter (Signed)
Will from [facility] called for verbal orders:   OT: 1 week 1, 2 week 2, 1 week 1  PT: 2 week 1, 1 week 5

## 2022-03-04 NOTE — Telephone Encounter (Signed)
Called and gave verbal orders.

## 2022-03-07 DIAGNOSIS — E1122 Type 2 diabetes mellitus with diabetic chronic kidney disease: Secondary | ICD-10-CM | POA: Diagnosis not present

## 2022-03-07 DIAGNOSIS — Z794 Long term (current) use of insulin: Secondary | ICD-10-CM | POA: Diagnosis not present

## 2022-03-07 DIAGNOSIS — E785 Hyperlipidemia, unspecified: Secondary | ICD-10-CM | POA: Diagnosis not present

## 2022-03-07 DIAGNOSIS — H53131 Sudden visual loss, right eye: Secondary | ICD-10-CM | POA: Diagnosis not present

## 2022-03-07 DIAGNOSIS — E1142 Type 2 diabetes mellitus with diabetic polyneuropathy: Secondary | ICD-10-CM | POA: Diagnosis not present

## 2022-03-07 DIAGNOSIS — N184 Chronic kidney disease, stage 4 (severe): Secondary | ICD-10-CM | POA: Diagnosis not present

## 2022-03-07 DIAGNOSIS — E1165 Type 2 diabetes mellitus with hyperglycemia: Secondary | ICD-10-CM | POA: Diagnosis not present

## 2022-03-07 DIAGNOSIS — F0670 Mild neurocognitive disorder due to known physiological condition without behavioral disturbance: Secondary | ICD-10-CM | POA: Diagnosis not present

## 2022-03-07 DIAGNOSIS — I129 Hypertensive chronic kidney disease with stage 1 through stage 4 chronic kidney disease, or unspecified chronic kidney disease: Secondary | ICD-10-CM | POA: Diagnosis not present

## 2022-03-07 DIAGNOSIS — H4311 Vitreous hemorrhage, right eye: Secondary | ICD-10-CM | POA: Diagnosis not present

## 2022-03-11 ENCOUNTER — Encounter (HOSPITAL_COMMUNITY): Payer: Medicare Other

## 2022-03-12 DIAGNOSIS — E1142 Type 2 diabetes mellitus with diabetic polyneuropathy: Secondary | ICD-10-CM | POA: Diagnosis not present

## 2022-03-12 DIAGNOSIS — H4311 Vitreous hemorrhage, right eye: Secondary | ICD-10-CM | POA: Diagnosis not present

## 2022-03-12 DIAGNOSIS — Z794 Long term (current) use of insulin: Secondary | ICD-10-CM | POA: Diagnosis not present

## 2022-03-12 DIAGNOSIS — I129 Hypertensive chronic kidney disease with stage 1 through stage 4 chronic kidney disease, or unspecified chronic kidney disease: Secondary | ICD-10-CM | POA: Diagnosis not present

## 2022-03-12 DIAGNOSIS — F0670 Mild neurocognitive disorder due to known physiological condition without behavioral disturbance: Secondary | ICD-10-CM | POA: Diagnosis not present

## 2022-03-12 DIAGNOSIS — H53131 Sudden visual loss, right eye: Secondary | ICD-10-CM | POA: Diagnosis not present

## 2022-03-12 DIAGNOSIS — N184 Chronic kidney disease, stage 4 (severe): Secondary | ICD-10-CM | POA: Diagnosis not present

## 2022-03-12 DIAGNOSIS — E1165 Type 2 diabetes mellitus with hyperglycemia: Secondary | ICD-10-CM | POA: Diagnosis not present

## 2022-03-12 DIAGNOSIS — E1122 Type 2 diabetes mellitus with diabetic chronic kidney disease: Secondary | ICD-10-CM | POA: Diagnosis not present

## 2022-03-12 DIAGNOSIS — E785 Hyperlipidemia, unspecified: Secondary | ICD-10-CM | POA: Diagnosis not present

## 2022-03-13 DIAGNOSIS — E785 Hyperlipidemia, unspecified: Secondary | ICD-10-CM | POA: Diagnosis not present

## 2022-03-13 DIAGNOSIS — I129 Hypertensive chronic kidney disease with stage 1 through stage 4 chronic kidney disease, or unspecified chronic kidney disease: Secondary | ICD-10-CM | POA: Diagnosis not present

## 2022-03-13 DIAGNOSIS — E1122 Type 2 diabetes mellitus with diabetic chronic kidney disease: Secondary | ICD-10-CM | POA: Diagnosis not present

## 2022-03-13 DIAGNOSIS — E1165 Type 2 diabetes mellitus with hyperglycemia: Secondary | ICD-10-CM | POA: Diagnosis not present

## 2022-03-13 DIAGNOSIS — H4311 Vitreous hemorrhage, right eye: Secondary | ICD-10-CM | POA: Diagnosis not present

## 2022-03-13 DIAGNOSIS — N184 Chronic kidney disease, stage 4 (severe): Secondary | ICD-10-CM | POA: Diagnosis not present

## 2022-03-13 DIAGNOSIS — H53131 Sudden visual loss, right eye: Secondary | ICD-10-CM | POA: Diagnosis not present

## 2022-03-13 DIAGNOSIS — F0670 Mild neurocognitive disorder due to known physiological condition without behavioral disturbance: Secondary | ICD-10-CM | POA: Diagnosis not present

## 2022-03-13 DIAGNOSIS — E1142 Type 2 diabetes mellitus with diabetic polyneuropathy: Secondary | ICD-10-CM | POA: Diagnosis not present

## 2022-03-13 DIAGNOSIS — Z794 Long term (current) use of insulin: Secondary | ICD-10-CM | POA: Diagnosis not present

## 2022-03-14 DIAGNOSIS — E1165 Type 2 diabetes mellitus with hyperglycemia: Secondary | ICD-10-CM | POA: Diagnosis not present

## 2022-03-14 DIAGNOSIS — F0670 Mild neurocognitive disorder due to known physiological condition without behavioral disturbance: Secondary | ICD-10-CM | POA: Diagnosis not present

## 2022-03-14 DIAGNOSIS — I129 Hypertensive chronic kidney disease with stage 1 through stage 4 chronic kidney disease, or unspecified chronic kidney disease: Secondary | ICD-10-CM | POA: Diagnosis not present

## 2022-03-14 DIAGNOSIS — E1142 Type 2 diabetes mellitus with diabetic polyneuropathy: Secondary | ICD-10-CM | POA: Diagnosis not present

## 2022-03-14 DIAGNOSIS — H4311 Vitreous hemorrhage, right eye: Secondary | ICD-10-CM | POA: Diagnosis not present

## 2022-03-14 DIAGNOSIS — Z794 Long term (current) use of insulin: Secondary | ICD-10-CM | POA: Diagnosis not present

## 2022-03-14 DIAGNOSIS — N184 Chronic kidney disease, stage 4 (severe): Secondary | ICD-10-CM | POA: Diagnosis not present

## 2022-03-14 DIAGNOSIS — H53131 Sudden visual loss, right eye: Secondary | ICD-10-CM | POA: Diagnosis not present

## 2022-03-14 DIAGNOSIS — E785 Hyperlipidemia, unspecified: Secondary | ICD-10-CM | POA: Diagnosis not present

## 2022-03-14 DIAGNOSIS — E1122 Type 2 diabetes mellitus with diabetic chronic kidney disease: Secondary | ICD-10-CM | POA: Diagnosis not present

## 2022-03-17 DIAGNOSIS — E1122 Type 2 diabetes mellitus with diabetic chronic kidney disease: Secondary | ICD-10-CM | POA: Diagnosis not present

## 2022-03-17 DIAGNOSIS — E1165 Type 2 diabetes mellitus with hyperglycemia: Secondary | ICD-10-CM | POA: Diagnosis not present

## 2022-03-17 DIAGNOSIS — H4311 Vitreous hemorrhage, right eye: Secondary | ICD-10-CM | POA: Diagnosis not present

## 2022-03-17 DIAGNOSIS — I129 Hypertensive chronic kidney disease with stage 1 through stage 4 chronic kidney disease, or unspecified chronic kidney disease: Secondary | ICD-10-CM | POA: Diagnosis not present

## 2022-03-17 DIAGNOSIS — E785 Hyperlipidemia, unspecified: Secondary | ICD-10-CM | POA: Diagnosis not present

## 2022-03-17 DIAGNOSIS — N184 Chronic kidney disease, stage 4 (severe): Secondary | ICD-10-CM | POA: Diagnosis not present

## 2022-03-17 DIAGNOSIS — E1142 Type 2 diabetes mellitus with diabetic polyneuropathy: Secondary | ICD-10-CM | POA: Diagnosis not present

## 2022-03-17 DIAGNOSIS — Z794 Long term (current) use of insulin: Secondary | ICD-10-CM | POA: Diagnosis not present

## 2022-03-17 DIAGNOSIS — F0670 Mild neurocognitive disorder due to known physiological condition without behavioral disturbance: Secondary | ICD-10-CM | POA: Diagnosis not present

## 2022-03-17 DIAGNOSIS — H53131 Sudden visual loss, right eye: Secondary | ICD-10-CM | POA: Diagnosis not present

## 2022-03-20 DIAGNOSIS — N184 Chronic kidney disease, stage 4 (severe): Secondary | ICD-10-CM | POA: Diagnosis not present

## 2022-03-20 DIAGNOSIS — H4311 Vitreous hemorrhage, right eye: Secondary | ICD-10-CM | POA: Diagnosis not present

## 2022-03-20 DIAGNOSIS — E1122 Type 2 diabetes mellitus with diabetic chronic kidney disease: Secondary | ICD-10-CM | POA: Diagnosis not present

## 2022-03-20 DIAGNOSIS — E1165 Type 2 diabetes mellitus with hyperglycemia: Secondary | ICD-10-CM | POA: Diagnosis not present

## 2022-03-20 DIAGNOSIS — E1142 Type 2 diabetes mellitus with diabetic polyneuropathy: Secondary | ICD-10-CM | POA: Diagnosis not present

## 2022-03-20 DIAGNOSIS — H53131 Sudden visual loss, right eye: Secondary | ICD-10-CM | POA: Diagnosis not present

## 2022-03-20 DIAGNOSIS — F0670 Mild neurocognitive disorder due to known physiological condition without behavioral disturbance: Secondary | ICD-10-CM | POA: Diagnosis not present

## 2022-03-20 DIAGNOSIS — E785 Hyperlipidemia, unspecified: Secondary | ICD-10-CM | POA: Diagnosis not present

## 2022-03-20 DIAGNOSIS — I129 Hypertensive chronic kidney disease with stage 1 through stage 4 chronic kidney disease, or unspecified chronic kidney disease: Secondary | ICD-10-CM | POA: Diagnosis not present

## 2022-03-20 DIAGNOSIS — Z794 Long term (current) use of insulin: Secondary | ICD-10-CM | POA: Diagnosis not present

## 2022-03-24 ENCOUNTER — Ambulatory Visit (HOSPITAL_COMMUNITY)
Admission: RE | Admit: 2022-03-24 | Discharge: 2022-03-24 | Disposition: A | Discharge status: Home / Self Care | Payer: Medicare Other | Source: Ambulatory Visit | Attending: Nephrology | Admitting: Nephrology

## 2022-03-24 VITALS — BP 153/74 | HR 76 | Temp 96.6°F | Resp 15 | Wt 160.0 lb

## 2022-03-24 DIAGNOSIS — H53131 Sudden visual loss, right eye: Secondary | ICD-10-CM | POA: Diagnosis not present

## 2022-03-24 DIAGNOSIS — N184 Chronic kidney disease, stage 4 (severe): Secondary | ICD-10-CM | POA: Diagnosis not present

## 2022-03-24 DIAGNOSIS — E1142 Type 2 diabetes mellitus with diabetic polyneuropathy: Secondary | ICD-10-CM | POA: Diagnosis not present

## 2022-03-24 DIAGNOSIS — Z794 Long term (current) use of insulin: Secondary | ICD-10-CM | POA: Diagnosis not present

## 2022-03-24 DIAGNOSIS — H4311 Vitreous hemorrhage, right eye: Secondary | ICD-10-CM | POA: Diagnosis not present

## 2022-03-24 DIAGNOSIS — D631 Anemia in chronic kidney disease: Secondary | ICD-10-CM | POA: Diagnosis not present

## 2022-03-24 DIAGNOSIS — E1165 Type 2 diabetes mellitus with hyperglycemia: Secondary | ICD-10-CM | POA: Diagnosis not present

## 2022-03-24 DIAGNOSIS — F0670 Mild neurocognitive disorder due to known physiological condition without behavioral disturbance: Secondary | ICD-10-CM | POA: Diagnosis not present

## 2022-03-24 DIAGNOSIS — E785 Hyperlipidemia, unspecified: Secondary | ICD-10-CM | POA: Diagnosis not present

## 2022-03-24 DIAGNOSIS — E1122 Type 2 diabetes mellitus with diabetic chronic kidney disease: Secondary | ICD-10-CM | POA: Diagnosis not present

## 2022-03-24 DIAGNOSIS — I129 Hypertensive chronic kidney disease with stage 1 through stage 4 chronic kidney disease, or unspecified chronic kidney disease: Secondary | ICD-10-CM | POA: Diagnosis not present

## 2022-03-24 LAB — IRON AND TIBC
Iron: 72 ug/dL (ref 28–170)
Saturation Ratios: 31 % (ref 10.4–31.8)
TIBC: 235 ug/dL — ABNORMAL LOW (ref 250–450)
UIBC: 163 ug/dL

## 2022-03-24 LAB — POCT HEMOGLOBIN-HEMACUE: Hemoglobin: 10 g/dL — ABNORMAL LOW (ref 12.0–15.0)

## 2022-03-24 LAB — FERRITIN: Ferritin: 146 ng/mL (ref 11–307)

## 2022-03-24 MED ORDER — EPOETIN ALFA-EPBX 10000 UNIT/ML IJ SOLN
20000.0000 [IU] | INTRAMUSCULAR | Status: DC
  Administered 2022-03-24: 20000 [IU] via SUBCUTANEOUS

## 2022-03-24 MED ORDER — EPOETIN ALFA-EPBX 10000 UNIT/ML IJ SOLN
INTRAMUSCULAR | Status: AC
  Filled 2022-03-24: qty 2

## 2022-03-25 DIAGNOSIS — E1165 Type 2 diabetes mellitus with hyperglycemia: Secondary | ICD-10-CM | POA: Diagnosis not present

## 2022-03-25 DIAGNOSIS — H53131 Sudden visual loss, right eye: Secondary | ICD-10-CM | POA: Diagnosis not present

## 2022-03-25 DIAGNOSIS — F0670 Mild neurocognitive disorder due to known physiological condition without behavioral disturbance: Secondary | ICD-10-CM | POA: Diagnosis not present

## 2022-03-25 DIAGNOSIS — N184 Chronic kidney disease, stage 4 (severe): Secondary | ICD-10-CM | POA: Diagnosis not present

## 2022-03-25 DIAGNOSIS — I129 Hypertensive chronic kidney disease with stage 1 through stage 4 chronic kidney disease, or unspecified chronic kidney disease: Secondary | ICD-10-CM | POA: Diagnosis not present

## 2022-03-25 DIAGNOSIS — E1122 Type 2 diabetes mellitus with diabetic chronic kidney disease: Secondary | ICD-10-CM | POA: Diagnosis not present

## 2022-03-25 DIAGNOSIS — E1142 Type 2 diabetes mellitus with diabetic polyneuropathy: Secondary | ICD-10-CM | POA: Diagnosis not present

## 2022-03-25 DIAGNOSIS — E785 Hyperlipidemia, unspecified: Secondary | ICD-10-CM | POA: Diagnosis not present

## 2022-03-25 DIAGNOSIS — H4311 Vitreous hemorrhage, right eye: Secondary | ICD-10-CM | POA: Diagnosis not present

## 2022-03-25 DIAGNOSIS — Z794 Long term (current) use of insulin: Secondary | ICD-10-CM | POA: Diagnosis not present

## 2022-03-26 DIAGNOSIS — H4311 Vitreous hemorrhage, right eye: Secondary | ICD-10-CM | POA: Diagnosis not present

## 2022-03-26 DIAGNOSIS — H53131 Sudden visual loss, right eye: Secondary | ICD-10-CM | POA: Diagnosis not present

## 2022-03-26 DIAGNOSIS — E1142 Type 2 diabetes mellitus with diabetic polyneuropathy: Secondary | ICD-10-CM | POA: Diagnosis not present

## 2022-03-26 DIAGNOSIS — I129 Hypertensive chronic kidney disease with stage 1 through stage 4 chronic kidney disease, or unspecified chronic kidney disease: Secondary | ICD-10-CM | POA: Diagnosis not present

## 2022-03-26 DIAGNOSIS — Z794 Long term (current) use of insulin: Secondary | ICD-10-CM | POA: Diagnosis not present

## 2022-03-26 DIAGNOSIS — E1122 Type 2 diabetes mellitus with diabetic chronic kidney disease: Secondary | ICD-10-CM | POA: Diagnosis not present

## 2022-03-26 DIAGNOSIS — N184 Chronic kidney disease, stage 4 (severe): Secondary | ICD-10-CM | POA: Diagnosis not present

## 2022-03-26 DIAGNOSIS — E785 Hyperlipidemia, unspecified: Secondary | ICD-10-CM | POA: Diagnosis not present

## 2022-03-26 DIAGNOSIS — E1165 Type 2 diabetes mellitus with hyperglycemia: Secondary | ICD-10-CM | POA: Diagnosis not present

## 2022-03-26 DIAGNOSIS — F0670 Mild neurocognitive disorder due to known physiological condition without behavioral disturbance: Secondary | ICD-10-CM | POA: Diagnosis not present

## 2022-03-27 ENCOUNTER — Ambulatory Visit (INDEPENDENT_AMBULATORY_CARE_PROVIDER_SITE_OTHER): Payer: Medicare Other | Admitting: Ophthalmology

## 2022-03-27 ENCOUNTER — Encounter (INDEPENDENT_AMBULATORY_CARE_PROVIDER_SITE_OTHER): Payer: Self-pay | Admitting: Ophthalmology

## 2022-03-27 ENCOUNTER — Encounter: Payer: Self-pay | Admitting: Internal Medicine

## 2022-03-27 DIAGNOSIS — N281 Cyst of kidney, acquired: Secondary | ICD-10-CM | POA: Diagnosis not present

## 2022-03-27 DIAGNOSIS — H40041 Steroid responder, right eye: Secondary | ICD-10-CM

## 2022-03-27 DIAGNOSIS — E872 Acidosis, unspecified: Secondary | ICD-10-CM | POA: Diagnosis not present

## 2022-03-27 DIAGNOSIS — H4311 Vitreous hemorrhage, right eye: Secondary | ICD-10-CM

## 2022-03-27 DIAGNOSIS — H5316 Psychophysical visual disturbances: Secondary | ICD-10-CM | POA: Diagnosis not present

## 2022-03-27 DIAGNOSIS — H3589 Other specified retinal disorders: Secondary | ICD-10-CM

## 2022-03-27 DIAGNOSIS — N184 Chronic kidney disease, stage 4 (severe): Secondary | ICD-10-CM | POA: Diagnosis not present

## 2022-03-27 DIAGNOSIS — N2581 Secondary hyperparathyroidism of renal origin: Secondary | ICD-10-CM | POA: Diagnosis not present

## 2022-03-27 DIAGNOSIS — E1122 Type 2 diabetes mellitus with diabetic chronic kidney disease: Secondary | ICD-10-CM | POA: Diagnosis not present

## 2022-03-27 DIAGNOSIS — D631 Anemia in chronic kidney disease: Secondary | ICD-10-CM | POA: Diagnosis not present

## 2022-03-27 DIAGNOSIS — I129 Hypertensive chronic kidney disease with stage 1 through stage 4 chronic kidney disease, or unspecified chronic kidney disease: Secondary | ICD-10-CM | POA: Diagnosis not present

## 2022-03-27 LAB — BASIC METABOLIC PANEL
BUN: 37 — AB (ref 4–21)
CO2: 24 — AB (ref 13–22)
Chloride: 110 — AB (ref 99–108)
Creatinine: 2.6 — AB (ref 0.5–1.1)
Glucose: 138
Potassium: 4.3 mEq/L (ref 3.5–5.1)
Sodium: 141 (ref 137–147)

## 2022-03-27 LAB — COMPREHENSIVE METABOLIC PANEL
Albumin: 4.2 (ref 3.5–5.0)
Calcium: 9.1 (ref 8.7–10.7)
eGFR: 19

## 2022-03-27 NOTE — Assessment & Plan Note (Signed)
Condition resolved post vitrectomy OD good PRP

## 2022-03-27 NOTE — Progress Notes (Signed)
03/27/2022     CHIEF COMPLAINT Patient presents for  Chief Complaint  Patient presents with   Retina Follow Up      HISTORY OF PRESENT ILLNESS: Miranda Roman is a 76 y.o. female who presents to the clinic today for:   HPI     Retina Follow Up           Diagnosis: Other   Laterality: right eye   Severity: moderate   Course: stable         Comments   6 MOS FOR COLOR FP, OCT, DILATE OU. Pt stated vision as remained stable. Pt reports slight improvement in vision. Pt stated blood sugar control is well maintained.        Last edited by Silvestre Moment on 03/27/2022  2:48 PM.      Referring physician: Rutherford Guys, [place],  [place] [address]  HISTORICAL INFORMATION:   Selected notes from the MEDICAL RECORD NUMBER    Lab Results  Component Value Date   HGBA1C 5.5 01/29/2022     CURRENT MEDICATIONS: Current Outpatient Medications (Ophthalmic Drugs)  Medication Sig   ofloxacin (OCUFLOX) 0.3 % ophthalmic solution    No current facility-administered medications for this visit. (Ophthalmic Drugs)   Current Outpatient Medications (Other)  Medication Sig   acetaminophen (TYLENOL) 500 MG tablet Take 1 tablet (500 mg total) by mouth every 6 (six) hours as needed for moderate pain or fever.   amLODipine (NORVASC) 2.5 MG tablet Take 2.5 mg by mouth daily.   atorvastatin (LIPITOR) 20 MG tablet TAKE ONE TABLET BY MOUTH MONDAY THRU FRIDAY AND SKIP WEEKENDS   AZO-CRANBERRY PO Take 1 capsule by mouth daily at 6 (six) AM.   b complex vitamins capsule Take 1 capsule by mouth daily.   BD INSULIN SYRINGE U/F 31G X 5/16" 0.3 ML MISC See admin instructions. use with insulin   calcitRIOL (ROCALTROL) 0.25 MCG capsule Take 0.25 mcg by mouth daily.   cetirizine (ZYRTEC) 10 MG tablet Take 10 mg by mouth daily.   Cyanocobalamin (VITAMIN B12) 1000 MCG TBCR 1 tablet   diclofenac Sodium (VOLTAREN) 1 % GEL Apply 2 g topically 4 (four) times daily. Apply to left  knee.   epoetin alfa (PROCRIT) 75102 UNIT/ML injection Every other week   gabapentin (NEURONTIN) 100 MG capsule TAKE 1 CAPSULE BY MOUTH AT BEDTIME   HUMALOG 100 UNIT/ML injection INJECT 4 UNIT(S) SUBCUTANEOUSLY 3 TIMES DAILY (Patient taking differently: once. INJECT 4 UNIT(S) SUBCUTANEOUSLY 3 TIMES DAILY)   insulin detemir (LEVEMIR FLEXTOUCH) 100 UNIT/ML FlexPen 6 Units daily.   Insulin Pen Needle (BD PEN NEEDLE NANO U/F) 32G X 4 MM MISC Use as directed with insulin pen   LEVEMIR 100 UNIT/ML injection INJECT 0.12 MLS (12 UNITS TOTAL) INTO THE SKIN 2 (TWO) TIMES DAILY.   metoprolol tartrate (LOPRESSOR) 50 MG tablet TAKE 1 TABLET BY MOUTH TWICE A DAY   Multiple Vitamins-Minerals (CENTRUM SILVER PO) Take by mouth daily.   OneTouch Delica Lancets 58N MISC See admin instructions.   sodium bicarbonate 650 MG tablet Take 650 mg by mouth 2 (two) times daily.   No current facility-administered medications for this visit. (Other)      REVIEW OF SYSTEMS: ROS   Negative for: Constitutional, Gastrointestinal, Neurological, Skin, Genitourinary, Musculoskeletal, HENT, Endocrine, Cardiovascular, Eyes, Respiratory, Psychiatric, Allergic/Imm, Heme/Lymph Last edited by Silvestre Moment on 03/27/2022  2:48 PM.       ALLERGIES Allergies  Allergen Reactions   Cefepime Other (  See Comments)    Causes encephalitis - reported by [facility] 12/31/2019   Lactose Intolerance (Gi)     Per pt's daughter causes gas    PAST MEDICAL HISTORY Past Medical History:  Diagnosis Date   Abnormal CT of the chest 12/28/2018   Acute encephalopathy 01/13/2020   Altered mental status    Anemia of chronic renal failure, stage 4 (severe) 01/04/2022   Aortic valve regurgitation 02/11/2016   Atherosclerosis of native coronary artery of native heart without angina pectoris 12/24/2012   Mild, non-obstructive CAD by cath in 2007   Atrophic vaginitis 12/15/2018   Atypical squamous cells of undetermined significance on cytologic  smear of cervix (ASC-US) 12/15/2018   Benign essential hypertension 12/24/2012   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   Borderline steroid-induced glaucoma, 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   OD, Early elevated intraocular pressure to 28 mm we will discontinue all topical medications from surgery in the right eye today   Bronchiectasis without complication 17/06/7870   Cataract 07/03/2020   Chest pain at rest 01/30/2015   Chronic kidney disease, stage 4 (severe) 12/07/2020   Closed fracture of right tibial plateau 07/17/2021   Cyst of left kidney 03/10/2019   Roman repeated US 08/2019   Dysphagia, neurologic    Elevated d-dimer 12/20/2019   Facet arthritis of lumbar region 02/27/2019   Gastroesophageal reflux disease 08/26/2016   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  Further history patient suffered possible Wernicke's encephalopathy the past, a type of the vitamin deficiencies, thiamine that can also affect the retina if there is significant damage which may not be recoverable.   Mild neurocognitive disorder due to multiple etiologies 01/23/2022   Osteoporosis 12/15/2018   Overweight with body mass index (BMI) 25.0-29.9 12/15/2018   PAD (peripheral artery disease) 06/06/2019   Pain in right hand 04/27/2020   Pancreatic cyst 06/06/2019   Polyneuropathy due to type 2 diabetes mellitus    Posterior vitreous detachment of left eye 08/05/2021   Proliferative diabetic retinopathy of right eye 08/22/2021   Likely contributory cause of nonclearing  vitreous hemorrhage in the right eye now status post PRP likely to resolve in 2 quiescent PDR, post vitrectomy PRP   Pulmonary hypertension 02/04/2021   Right knee pain 07/10/2021   Sickle-cell trait 12/07/2020   Type II diabetes mellitus 03/10/2012   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   Vitreous hemorrhage of right eye 08/05/2021   Vitrectomy PRP 08-14-2021   Past Surgical History:  Procedure Laterality Date   BONE RESECTION  01/2006   wrist   GASTRIC BYPASS  08/14/2009   OTHER SURGICAL HISTORY     sickle cell retinopathy laser surgery   TUBAL LIGATION  04/28/1977    FAMILY HISTORY Family History  Problem Relation Age of Onset   Diabetes Mother    Heart disease Mother    Hypertension Mother    Stroke Mother    Diabetes Father    Heart disease Father    Diabetes Sister    Diabetes Brother  Hypertension Brother    Sickle cell anemia Brother    Diabetes Maternal Aunt    Diabetes Maternal Uncle    Diabetes Maternal Grandmother    Diabetes Maternal Grandfather    Sickle cell anemia Sister    Sickle cell anemia Sister    Healthy Son    Healthy Son    Healthy Daughter     SOCIAL HISTORY Social History   Tobacco Use   Smoking status: Former    Packs/day: 0.25    Years: 43.00    Total pack years: 10.75    Types: Cigarettes    Start date: 04/1962    Quit date: 04/2005    Years since quitting: 16.9   Smokeless tobacco: Never  Vaping Use   Vaping Use: Never used  Substance Use Topics   Alcohol use: Not Currently    Comment: occasionally   Drug use: No         OPHTHALMIC EXAM:  Base Eye Exam     Visual Acuity (ETDRS)       Right Left   Dist cc 20/150 20/200 -1   Dist ph cc 20/100 -1 20/100 -2    Correction: Glasses         Tonometry (Tonopen, 2:57 PM)       Right Left   Pressure 18 19         Pupils       Pupils APD   Right PERRL None   Left PERRL None         Visual Fields       Left Right     Full Full         Extraocular Movement       Right Left    Full, Ortho Full, Ortho         Neuro/Psych     Oriented x3: Yes   Mood/Affect: Normal         Dilation     Both eyes: 1.0% Mydriacyl, 2.5% Phenylephrine @ 2:57 PM           Slit Lamp and Fundus Exam     External Exam       Right Left   External Normal Normal         Slit Lamp Exam       Right Left   Lids/Lashes Normal Normal   Conjunctiva/Sclera White and quiet White and quiet   Cornea Clear Clear   Anterior Chamber Deep and quiet Deep and quiet   Iris Round and reactive Round and reactive   Lens Centered posterior chamber intraocular lens Centered posterior chamber intraocular lens   Anterior Vitreous Normal Normal         Fundus Exam       Right Left   Posterior Vitreous clear avitric Clear media   Disc 1+ Pallor 1+ Pallor   C/D Ratio 0.7 0.55   Macula Microaneurysms, no macular thickening Normal   Vessels PDR quiescent Normal   Periphery Periphery attached, good PRP 360 clear media Large superotemporal chorioretinal scar, retina attached            IMAGING AND PROCEDURES  Imaging and Procedures for 03/27/22  OCT, Retina - OU - Both Eyes       Right Eye Central Foveal Thickness: 237. Progression has no prior data. Findings include normal foveal contour.   Left Eye Central Foveal Thickness: 230. Progression has been stable. Findings include abnormal foveal contour.   Notes OD now with clear  media, post vitrectomy for nonclearing vitreous hemorrhage, diffuse macular atrophy is present however, similar to the diffuse macular atrophy in the left eye.  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   Diffuse macular and retinal atrophy accounts for acuity   OS diffuse macular atrophy, no change over time, accounts for acuity.     Color Fundus Photography Optos - OU - Both Eyes        OD with quiescent PDR.  Good peripheral PRP, no active disease  OS with diffuse atrophy as well good chorioretinal scar temporally.  No progression of retinopathy.             ASSESSMENT/PLAN:  Vitreous hemorrhage of right eye Condition resolved post vitrectomy OD good PRP  Macular atrophy, retinal Diffuse macular atrophy.  Likely accounts for acuity.  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  Borderline steroid-induced glaucoma, right Follow-up Dr. Rutherford Guys as scheduled.  Miranda Roman syndrome Description with the patient and family about Miranda Roman syndrome for which she is developing some troubles.  Reassured the patient this is not from the eyes but rather from the brain with wanting to see things"     ICD-10-CM   1. Vitreous hemorrhage of right eye ([place])  H43.11 OCT, Retina - OU - Both Eyes    Color Fundus Photography Optos - OU - Both Eyes    2. Macular atrophy, retinal  H35.89     3. 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  OD vitreous hemorrhage is cleared.  2.  Macular atrophy limits acuity.  3.  No sign of progression of retinopathy at this time.  Ophthalmic Meds Ordered this visit:  No orders of the defined types were placed in this encounter.      Return in about 9 months (around 12/26/2022) for DILATE OU, COLOR FP, OCT.  There are no Patient Instructions on file for this visit.   Explained the diagnoses, plan, and follow up with the patient and they expressed understanding.  Patient expressed understanding of the importance of proper follow up care.   Clent Demark Uzair Godley M.D. Diseases & Surgery of the Retina and Vitreous [facility]ina [facility] [place] 03/27/22     Abbreviations: M myopia (nearsighted); A astigmatism; H hyperopia (farsighted); P presbyopia; Mrx spectacle  prescription;  CTL contact lenses; OD right eye; OS left eye; OU both eyes  XT exotropia; ET esotropia; PEK punctate epithelial keratitis; PEE punctate epithelial erosions; DES dry eye syndrome; MGD meibomian gland dysfunction; ATs artificial tears; PFAT's preservative free artificial tears; [place] nuclear sclerotic cataract; PSC posterior subcapsular cataract; ERM epi-retinal membrane; PVD posterior vitreous detachment; RD retinal detachment; DM diabetes mellitus; DR diabetic retinopathy; NPDR non-proliferative diabetic retinopathy; PDR proliferative diabetic retinopathy; CSME clinically significant macular edema; DME diabetic macular edema; dbh dot blot hemorrhages; CWS cotton wool spot; POAG primary open angle glaucoma; C/D cup-to-disc ratio; HVF humphrey visual field; GVF goldmann visual field; OCT optical coherence tomography; IOP intraocular pressure; BRVO Branch retinal vein occlusion; CRVO central retinal vein occlusion; CRAO central retinal artery occlusion; BRAO branch retinal artery occlusion; RT retinal tear; SB scleral buckle; PPV pars plana vitrectomy; VH Vitreous hemorrhage; PRP panretinal laser photocoagulation; IVK intravitreal kenalog; VMT vitreomacular traction; MH Macular hole;  NVD neovascularization of  the disc; NVE neovascularization elsewhere; AREDS age related eye disease study; ARMD age related macular degeneration; POAG primary open angle glaucoma; EBMD epithelial/anterior basement membrane dystrophy; ACIOL anterior chamber intraocular lens; IOL intraocular lens; PCIOL posterior chamber intraocular lens; Phaco/IOL phacoemulsification with intraocular lens placement; [place] photorefractive keratectomy; LASIK laser assisted in situ keratomileusis; HTN hypertension; DM diabetes mellitus; COPD chronic obstructive pulmonary disease

## 2022-03-27 NOTE — Assessment & Plan Note (Signed)
Diffuse macular atrophy.  Likely accounts for acuity.  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

## 2022-03-27 NOTE — Assessment & Plan Note (Signed)
Description with the patient and family about [name] syndrome for which she is developing some troubles.  Reassured the patient this is not from the eyes but rather from the brain with wanting to see things"

## 2022-03-27 NOTE — Assessment & Plan Note (Signed)
Follow-up Dr. Rutherford Guys as scheduled.

## 2022-03-28 DIAGNOSIS — H53131 Sudden visual loss, right eye: Secondary | ICD-10-CM | POA: Diagnosis not present

## 2022-03-28 DIAGNOSIS — E785 Hyperlipidemia, unspecified: Secondary | ICD-10-CM | POA: Diagnosis not present

## 2022-03-28 DIAGNOSIS — E1122 Type 2 diabetes mellitus with diabetic chronic kidney disease: Secondary | ICD-10-CM | POA: Diagnosis not present

## 2022-03-28 DIAGNOSIS — N184 Chronic kidney disease, stage 4 (severe): Secondary | ICD-10-CM | POA: Diagnosis not present

## 2022-03-28 DIAGNOSIS — E1165 Type 2 diabetes mellitus with hyperglycemia: Secondary | ICD-10-CM | POA: Diagnosis not present

## 2022-03-28 DIAGNOSIS — I129 Hypertensive chronic kidney disease with stage 1 through stage 4 chronic kidney disease, or unspecified chronic kidney disease: Secondary | ICD-10-CM | POA: Diagnosis not present

## 2022-03-28 DIAGNOSIS — Z794 Long term (current) use of insulin: Secondary | ICD-10-CM | POA: Diagnosis not present

## 2022-03-28 DIAGNOSIS — H4311 Vitreous hemorrhage, right eye: Secondary | ICD-10-CM | POA: Diagnosis not present

## 2022-03-28 DIAGNOSIS — E1142 Type 2 diabetes mellitus with diabetic polyneuropathy: Secondary | ICD-10-CM | POA: Diagnosis not present

## 2022-03-28 DIAGNOSIS — F0670 Mild neurocognitive disorder due to known physiological condition without behavioral disturbance: Secondary | ICD-10-CM | POA: Diagnosis not present

## 2022-03-28 LAB — BASIC METABOLIC PANEL
BUN: 37 — AB (ref 4–21)
CO2: 24 — AB (ref 13–22)
Chloride: 110 — AB (ref 99–108)
Creatinine: 2.6 — AB (ref 0.5–1.1)
Glucose: 138
Potassium: 4.3 mEq/L (ref 3.5–5.1)
Sodium: 141 (ref 137–147)

## 2022-03-28 LAB — COMPREHENSIVE METABOLIC PANEL
Albumin: 4.2 (ref 3.5–5.0)
Calcium: 9.1 (ref 8.7–10.7)
eGFR: 19

## 2022-03-28 LAB — CBC AND DIFFERENTIAL: Hemoglobin: 10.5 — AB (ref 12.0–16.0)

## 2022-03-31 DIAGNOSIS — N184 Chronic kidney disease, stage 4 (severe): Secondary | ICD-10-CM | POA: Diagnosis not present

## 2022-03-31 DIAGNOSIS — E1122 Type 2 diabetes mellitus with diabetic chronic kidney disease: Secondary | ICD-10-CM | POA: Diagnosis not present

## 2022-03-31 DIAGNOSIS — E785 Hyperlipidemia, unspecified: Secondary | ICD-10-CM | POA: Diagnosis not present

## 2022-03-31 DIAGNOSIS — F0670 Mild neurocognitive disorder due to known physiological condition without behavioral disturbance: Secondary | ICD-10-CM | POA: Diagnosis not present

## 2022-03-31 DIAGNOSIS — E1165 Type 2 diabetes mellitus with hyperglycemia: Secondary | ICD-10-CM | POA: Diagnosis not present

## 2022-03-31 DIAGNOSIS — H53131 Sudden visual loss, right eye: Secondary | ICD-10-CM | POA: Diagnosis not present

## 2022-03-31 DIAGNOSIS — Z794 Long term (current) use of insulin: Secondary | ICD-10-CM | POA: Diagnosis not present

## 2022-03-31 DIAGNOSIS — I129 Hypertensive chronic kidney disease with stage 1 through stage 4 chronic kidney disease, or unspecified chronic kidney disease: Secondary | ICD-10-CM | POA: Diagnosis not present

## 2022-03-31 DIAGNOSIS — E1142 Type 2 diabetes mellitus with diabetic polyneuropathy: Secondary | ICD-10-CM | POA: Diagnosis not present

## 2022-03-31 DIAGNOSIS — H4311 Vitreous hemorrhage, right eye: Secondary | ICD-10-CM | POA: Diagnosis not present

## 2022-04-01 DIAGNOSIS — Z794 Long term (current) use of insulin: Secondary | ICD-10-CM | POA: Diagnosis not present

## 2022-04-01 DIAGNOSIS — E785 Hyperlipidemia, unspecified: Secondary | ICD-10-CM | POA: Diagnosis not present

## 2022-04-01 DIAGNOSIS — E1165 Type 2 diabetes mellitus with hyperglycemia: Secondary | ICD-10-CM | POA: Diagnosis not present

## 2022-04-01 DIAGNOSIS — H4311 Vitreous hemorrhage, right eye: Secondary | ICD-10-CM | POA: Diagnosis not present

## 2022-04-01 DIAGNOSIS — N184 Chronic kidney disease, stage 4 (severe): Secondary | ICD-10-CM | POA: Diagnosis not present

## 2022-04-01 DIAGNOSIS — F0670 Mild neurocognitive disorder due to known physiological condition without behavioral disturbance: Secondary | ICD-10-CM | POA: Diagnosis not present

## 2022-04-01 DIAGNOSIS — E1122 Type 2 diabetes mellitus with diabetic chronic kidney disease: Secondary | ICD-10-CM | POA: Diagnosis not present

## 2022-04-01 DIAGNOSIS — I129 Hypertensive chronic kidney disease with stage 1 through stage 4 chronic kidney disease, or unspecified chronic kidney disease: Secondary | ICD-10-CM | POA: Diagnosis not present

## 2022-04-01 DIAGNOSIS — E1142 Type 2 diabetes mellitus with diabetic polyneuropathy: Secondary | ICD-10-CM | POA: Diagnosis not present

## 2022-04-01 DIAGNOSIS — H53131 Sudden visual loss, right eye: Secondary | ICD-10-CM | POA: Diagnosis not present

## 2022-04-03 DIAGNOSIS — N184 Chronic kidney disease, stage 4 (severe): Secondary | ICD-10-CM | POA: Diagnosis not present

## 2022-04-03 DIAGNOSIS — E1122 Type 2 diabetes mellitus with diabetic chronic kidney disease: Secondary | ICD-10-CM | POA: Diagnosis not present

## 2022-04-03 DIAGNOSIS — H53131 Sudden visual loss, right eye: Secondary | ICD-10-CM | POA: Diagnosis not present

## 2022-04-03 DIAGNOSIS — Z794 Long term (current) use of insulin: Secondary | ICD-10-CM | POA: Diagnosis not present

## 2022-04-03 DIAGNOSIS — F0670 Mild neurocognitive disorder due to known physiological condition without behavioral disturbance: Secondary | ICD-10-CM | POA: Diagnosis not present

## 2022-04-03 DIAGNOSIS — I129 Hypertensive chronic kidney disease with stage 1 through stage 4 chronic kidney disease, or unspecified chronic kidney disease: Secondary | ICD-10-CM | POA: Diagnosis not present

## 2022-04-03 DIAGNOSIS — E1165 Type 2 diabetes mellitus with hyperglycemia: Secondary | ICD-10-CM | POA: Diagnosis not present

## 2022-04-03 DIAGNOSIS — E1142 Type 2 diabetes mellitus with diabetic polyneuropathy: Secondary | ICD-10-CM | POA: Diagnosis not present

## 2022-04-03 DIAGNOSIS — H4311 Vitreous hemorrhage, right eye: Secondary | ICD-10-CM | POA: Diagnosis not present

## 2022-04-03 DIAGNOSIS — E785 Hyperlipidemia, unspecified: Secondary | ICD-10-CM | POA: Diagnosis not present

## 2022-04-04 ENCOUNTER — Encounter: Payer: Self-pay | Admitting: Podiatry

## 2022-04-04 ENCOUNTER — Ambulatory Visit (INDEPENDENT_AMBULATORY_CARE_PROVIDER_SITE_OTHER): Payer: Medicare Other | Admitting: Podiatry

## 2022-04-04 DIAGNOSIS — L84 Corns and callosities: Secondary | ICD-10-CM

## 2022-04-04 DIAGNOSIS — N184 Chronic kidney disease, stage 4 (severe): Secondary | ICD-10-CM | POA: Diagnosis not present

## 2022-04-04 DIAGNOSIS — M79674 Pain in right toe(s): Secondary | ICD-10-CM

## 2022-04-04 DIAGNOSIS — M79675 Pain in left toe(s): Secondary | ICD-10-CM | POA: Diagnosis not present

## 2022-04-04 DIAGNOSIS — B351 Tinea unguium: Secondary | ICD-10-CM

## 2022-04-04 DIAGNOSIS — E1122 Type 2 diabetes mellitus with diabetic chronic kidney disease: Secondary | ICD-10-CM

## 2022-04-04 NOTE — Progress Notes (Signed)
This patient returns to my office for at risk foot care.  This patient requires this care by a professional since this patient will be at risk due to having PAD and DM with kidney disease. This patient is unable to cut nails herself since the patient cannot reach her nails.These nails are painful walking and wearing shoes.  This patient presents for at risk foot care today.  General Appearance  Alert, conversant and in no acute stress.  Vascular  Dorsalis pedis and posterior tibial  pulses are palpable  bilaterally.  Capillary return is within normal limits  bilaterally. Temperature is within normal limits  bilaterally.  Neurologic  Senn-Weinstein monofilament wire test within normal limits  bilaterally. Muscle power within normal limits bilaterally.  Nails Thick disfigured discolored nails with subungual debris  from hallux to fifth toes bilaterally. No evidence of bacterial infection or drainage bilaterally.  Orthopedic  No limitations of motion  feet .  No crepitus or effusions noted.  No bony pathology or digital deformities noted.  Skin  normotropic skin with no porokeratosis noted bilaterally.  No signs of infections or ulcers noted.     Onychomycosis  Pain in right toes  Pain in left toes  Consent was obtained for treatment procedures.   Mechanical debridement of nails 1-5  bilaterally performed with a nail nipper.  Filed with dremel without incident.    Return office visit     3 months                 Told patient to return for periodic foot care and evaluation due to potential at risk complications.   Gardiner Barefoot DPM

## 2022-04-05 ENCOUNTER — Other Ambulatory Visit: Payer: Self-pay

## 2022-04-05 MED ORDER — HUMALOG 100 UNIT/ML IJ SOLN: 4.0000 [IU] | Freq: Three times a day (TID) | INTRAMUSCULAR | 3 refills | Status: DC

## 2022-04-05 MED ORDER — LEVEMIR FLEXTOUCH 100 UNIT/ML ~~LOC~~ SOPN: 6.0000 [IU] | PEN_INJECTOR | Freq: Every day | SUBCUTANEOUS | 3 refills | Status: DC

## 2022-04-07 DIAGNOSIS — E785 Hyperlipidemia, unspecified: Secondary | ICD-10-CM | POA: Diagnosis not present

## 2022-04-07 DIAGNOSIS — F0670 Mild neurocognitive disorder due to known physiological condition without behavioral disturbance: Secondary | ICD-10-CM | POA: Diagnosis not present

## 2022-04-07 DIAGNOSIS — H53131 Sudden visual loss, right eye: Secondary | ICD-10-CM | POA: Diagnosis not present

## 2022-04-07 DIAGNOSIS — N184 Chronic kidney disease, stage 4 (severe): Secondary | ICD-10-CM | POA: Diagnosis not present

## 2022-04-07 DIAGNOSIS — E1165 Type 2 diabetes mellitus with hyperglycemia: Secondary | ICD-10-CM | POA: Diagnosis not present

## 2022-04-07 DIAGNOSIS — I129 Hypertensive chronic kidney disease with stage 1 through stage 4 chronic kidney disease, or unspecified chronic kidney disease: Secondary | ICD-10-CM | POA: Diagnosis not present

## 2022-04-07 DIAGNOSIS — E1122 Type 2 diabetes mellitus with diabetic chronic kidney disease: Secondary | ICD-10-CM | POA: Diagnosis not present

## 2022-04-07 DIAGNOSIS — H4311 Vitreous hemorrhage, right eye: Secondary | ICD-10-CM | POA: Diagnosis not present

## 2022-04-07 DIAGNOSIS — E1142 Type 2 diabetes mellitus with diabetic polyneuropathy: Secondary | ICD-10-CM | POA: Diagnosis not present

## 2022-04-07 DIAGNOSIS — Z794 Long term (current) use of insulin: Secondary | ICD-10-CM | POA: Diagnosis not present

## 2022-04-08 ENCOUNTER — Ambulatory Visit: Payer: Medicare Other | Admitting: Podiatry

## 2022-04-08 DIAGNOSIS — Z794 Long term (current) use of insulin: Secondary | ICD-10-CM | POA: Diagnosis not present

## 2022-04-08 DIAGNOSIS — N184 Chronic kidney disease, stage 4 (severe): Secondary | ICD-10-CM | POA: Diagnosis not present

## 2022-04-08 DIAGNOSIS — E1142 Type 2 diabetes mellitus with diabetic polyneuropathy: Secondary | ICD-10-CM | POA: Diagnosis not present

## 2022-04-08 DIAGNOSIS — H4311 Vitreous hemorrhage, right eye: Secondary | ICD-10-CM | POA: Diagnosis not present

## 2022-04-08 DIAGNOSIS — I129 Hypertensive chronic kidney disease with stage 1 through stage 4 chronic kidney disease, or unspecified chronic kidney disease: Secondary | ICD-10-CM | POA: Diagnosis not present

## 2022-04-08 DIAGNOSIS — E1122 Type 2 diabetes mellitus with diabetic chronic kidney disease: Secondary | ICD-10-CM | POA: Diagnosis not present

## 2022-04-08 DIAGNOSIS — E785 Hyperlipidemia, unspecified: Secondary | ICD-10-CM | POA: Diagnosis not present

## 2022-04-08 DIAGNOSIS — F0670 Mild neurocognitive disorder due to known physiological condition without behavioral disturbance: Secondary | ICD-10-CM | POA: Diagnosis not present

## 2022-04-08 DIAGNOSIS — H53131 Sudden visual loss, right eye: Secondary | ICD-10-CM | POA: Diagnosis not present

## 2022-04-08 DIAGNOSIS — E1165 Type 2 diabetes mellitus with hyperglycemia: Secondary | ICD-10-CM | POA: Diagnosis not present

## 2022-04-14 ENCOUNTER — Ambulatory Visit: Payer: Medicare Other | Admitting: Student

## 2022-04-14 DIAGNOSIS — S022XXA Fracture of nasal bones, initial encounter for closed fracture: Secondary | ICD-10-CM | POA: Diagnosis not present

## 2022-04-14 DIAGNOSIS — W19XXXA Unspecified fall, initial encounter: Secondary | ICD-10-CM

## 2022-04-14 NOTE — Progress Notes (Signed)
Referring Provider Glendale Chard, [address] [place],  [place] [address]   CC:  Chief Complaint  Patient presents with   Follow-up      Miranda Roman is an 76 y.o. female.  HPI: Patient is a 76 year old female with history of a fall and subsequent nasal fracture. Patient was referred to plastic surgery for her nasal fracture and she was seen by Dr. Erin Hearing on 10/14/21 in the clinic. Patient denied any difficulties with breathing at this visit. Plan was for patient to follow up in 6 months to determine if a posttraumatic rhinoplasty would be indicated.   Today, patient is accompanied by her daughter.  Patient reports she has been doing well for the past 6 months.  She denies any issues with breathing.  She states that she is a little bothered that her nose is a little wider than it was before her fall.  Patient states that she possibly would want to move forward with surgery, but is a little hesitant given her age and her past medical history.  Daughter reports that she is a little hesitant as well.  Patient reports past medical history including chronic kidney disease, diabetes and a history of encephalopathy about 2 years ago.  Patient and patient's daughter reports that her most recent A1c was 5.2.  Review of Systems General: Denies difficulty breathing or SOB  Physical Exam    03/24/2022    8:38 AM 02/04/2022    9:01 AM 01/29/2022   11:10 AM  Vitals with BMI  Height  5' 3" 5' 3.8"  Weight 160 lbs 162 lbs 163 lbs  BMI  20.0 41.59  Systolic 301 237 990  Diastolic 74 60 62  Pulse 76  78    General:  No acute distress,  Alert and oriented, Non-Toxic, Normal speech and affect On exam, patient is sitting upright in no acute distress.  Her breathing is unlabored and normal.  Nose appears fairly symmetric.  There are no gross deformities noted on exam.  There is no tenderness to palpation.   Assessment/Plan  Closed fracture of nasal bone, initial encounter    I  discussed with the patient and the patient's daughter that there are risks associated with surgery. I discussed with the patient and the patient's daughter that given the patient's past medical history, she may be at higher risk for complications and/or delayed wound healing.  Patient and patient's daughter expressed understanding.  Patient and patient's daughter would like more time to think about if they would like to move forward with surgery and discuss with other family members. I discussed with the patient and the patient's daughter that the patient should follow-up with Dr. Marla Roe to further discuss the possibility of surgery after they have thought about it a little bit more.  Patient and patient's daughter agreed with this plan.  I made the patient and the patient's daughter is aware that Dr. Erin Hearing will no longer be with the practice by the end of the month.  Patient's daughter states that she is familiar with Dr. Marla Roe, and patient states she feels comfortable seeing Dr. Marla Roe to further discuss possible surgery.  Patient to follow-up in November for a telephone visit with Dr. Marla Roe to discuss the possibility of moving forward with surgery.  I instructed the patient and the patient's daughter to call if they have any questions or concerns in the meantime.  Pictures were obtained of the patient and placed in the chart  with the patient's or guardian's permission.   Clance Boll 04/14/2022, 3:12 PM

## 2022-04-18 ENCOUNTER — Other Ambulatory Visit: Payer: Self-pay | Admitting: Internal Medicine

## 2022-04-21 ENCOUNTER — Other Ambulatory Visit: Payer: Self-pay

## 2022-04-21 ENCOUNTER — Ambulatory Visit (HOSPITAL_COMMUNITY)
Admission: RE | Admit: 2022-04-21 | Discharge: 2022-04-21 | Disposition: A | Discharge status: Home / Self Care | Payer: Medicare Other | Source: Ambulatory Visit | Attending: Nephrology | Admitting: Nephrology

## 2022-04-21 VITALS — BP 139/60 | HR 70 | Temp 98.0°F | Resp 18

## 2022-04-21 DIAGNOSIS — E1122 Type 2 diabetes mellitus with diabetic chronic kidney disease: Secondary | ICD-10-CM | POA: Diagnosis not present

## 2022-04-21 LAB — FERRITIN: Ferritin: 146 ng/mL (ref 11–307)

## 2022-04-21 LAB — IRON AND TIBC
Iron: 73 ug/dL (ref 28–170)
Saturation Ratios: 32 % — ABNORMAL HIGH (ref 10.4–31.8)
TIBC: 227 ug/dL — ABNORMAL LOW (ref 250–450)
UIBC: 154 ug/dL

## 2022-04-21 LAB — POCT HEMOGLOBIN-HEMACUE: Hemoglobin: 10 g/dL — ABNORMAL LOW (ref 12.0–15.0)

## 2022-04-21 MED ORDER — EPOETIN ALFA-EPBX 10000 UNIT/ML IJ SOLN
INTRAMUSCULAR | Status: AC
  Administered 2022-04-21: 20000 [IU] via SUBCUTANEOUS
  Filled 2022-04-21: qty 2

## 2022-04-21 MED ORDER — BD PEN NEEDLE NANO U/F 32G X 4 MM MISC: 2 refills | Status: DC

## 2022-04-21 MED ORDER — EPOETIN ALFA-EPBX 10000 UNIT/ML IJ SOLN: 20000.0000 [IU] | INTRAMUSCULAR | Status: DC

## 2022-05-08 ENCOUNTER — Emergency Department (HOSPITAL_COMMUNITY): Payer: Medicare Other

## 2022-05-08 ENCOUNTER — Other Ambulatory Visit: Payer: Self-pay

## 2022-05-08 ENCOUNTER — Inpatient Hospital Stay (HOSPITAL_COMMUNITY)
Admission: EM | Admit: 2022-05-08 | Discharge: 2022-05-19 | DRG: 492 | Disposition: A | Discharge status: Rehabilitation Facility | Payer: Medicare Other | Attending: Internal Medicine | Admitting: Internal Medicine

## 2022-05-08 ENCOUNTER — Inpatient Hospital Stay (HOSPITAL_COMMUNITY): Payer: Medicare Other

## 2022-05-08 ENCOUNTER — Encounter (HOSPITAL_COMMUNITY): Payer: Self-pay

## 2022-05-08 DIAGNOSIS — S82831A Other fracture of upper and lower end of right fibula, initial encounter for closed fracture: Secondary | ICD-10-CM | POA: Diagnosis not present

## 2022-05-08 DIAGNOSIS — S52501A Unspecified fracture of the lower end of right radius, initial encounter for closed fracture: Secondary | ICD-10-CM | POA: Diagnosis not present

## 2022-05-08 DIAGNOSIS — D72829 Elevated white blood cell count, unspecified: Secondary | ICD-10-CM | POA: Diagnosis present

## 2022-05-08 DIAGNOSIS — M810 Age-related osteoporosis without current pathological fracture: Secondary | ICD-10-CM | POA: Diagnosis present

## 2022-05-08 DIAGNOSIS — D569 Thalassemia, unspecified: Secondary | ICD-10-CM | POA: Diagnosis present

## 2022-05-08 DIAGNOSIS — E1165 Type 2 diabetes mellitus with hyperglycemia: Secondary | ICD-10-CM | POA: Diagnosis present

## 2022-05-08 DIAGNOSIS — S52601A Unspecified fracture of lower end of right ulna, initial encounter for closed fracture: Secondary | ICD-10-CM | POA: Diagnosis not present

## 2022-05-08 DIAGNOSIS — R410 Disorientation, unspecified: Secondary | ICD-10-CM | POA: Diagnosis not present

## 2022-05-08 DIAGNOSIS — Z743 Need for continuous supervision: Secondary | ICD-10-CM | POA: Diagnosis not present

## 2022-05-08 DIAGNOSIS — Z8249 Family history of ischemic heart disease and other diseases of the circulatory system: Secondary | ICD-10-CM

## 2022-05-08 DIAGNOSIS — Z832 Family history of diseases of the blood and blood-forming organs and certain disorders involving the immune mechanism: Secondary | ICD-10-CM

## 2022-05-08 DIAGNOSIS — S82101A Unspecified fracture of upper end of right tibia, initial encounter for closed fracture: Secondary | ICD-10-CM | POA: Diagnosis not present

## 2022-05-08 DIAGNOSIS — Z794 Long term (current) use of insulin: Secondary | ICD-10-CM

## 2022-05-08 DIAGNOSIS — G8918 Other acute postprocedural pain: Secondary | ICD-10-CM | POA: Diagnosis not present

## 2022-05-08 DIAGNOSIS — J189 Pneumonia, unspecified organism: Secondary | ICD-10-CM | POA: Diagnosis not present

## 2022-05-08 DIAGNOSIS — K5900 Constipation, unspecified: Secondary | ICD-10-CM | POA: Diagnosis not present

## 2022-05-08 DIAGNOSIS — Y92008 Other place in unspecified non-institutional (private) residence as the place of occurrence of the external cause: Secondary | ICD-10-CM

## 2022-05-08 DIAGNOSIS — D62 Acute posthemorrhagic anemia: Secondary | ICD-10-CM

## 2022-05-08 DIAGNOSIS — E1122 Type 2 diabetes mellitus with diabetic chronic kidney disease: Secondary | ICD-10-CM | POA: Diagnosis not present

## 2022-05-08 DIAGNOSIS — D571 Sickle-cell disease without crisis: Secondary | ICD-10-CM | POA: Diagnosis present

## 2022-05-08 DIAGNOSIS — E1142 Type 2 diabetes mellitus with diabetic polyneuropathy: Secondary | ICD-10-CM

## 2022-05-08 DIAGNOSIS — S82201A Unspecified fracture of shaft of right tibia, initial encounter for closed fracture: Secondary | ICD-10-CM | POA: Diagnosis present

## 2022-05-08 DIAGNOSIS — D631 Anemia in chronic kidney disease: Secondary | ICD-10-CM | POA: Diagnosis present

## 2022-05-08 DIAGNOSIS — M25532 Pain in left wrist: Secondary | ICD-10-CM

## 2022-05-08 DIAGNOSIS — E739 Lactose intolerance, unspecified: Secondary | ICD-10-CM | POA: Diagnosis not present

## 2022-05-08 DIAGNOSIS — E785 Hyperlipidemia, unspecified: Secondary | ICD-10-CM | POA: Diagnosis present

## 2022-05-08 DIAGNOSIS — W010XXA Fall on same level from slipping, tripping and stumbling without subsequent striking against object, initial encounter: Secondary | ICD-10-CM | POA: Diagnosis not present

## 2022-05-08 DIAGNOSIS — Y95 Nosocomial condition: Secondary | ICD-10-CM | POA: Diagnosis not present

## 2022-05-08 DIAGNOSIS — R030 Elevated blood-pressure reading, without diagnosis of hypertension: Secondary | ICD-10-CM

## 2022-05-08 DIAGNOSIS — N184 Chronic kidney disease, stage 4 (severe): Secondary | ICD-10-CM

## 2022-05-08 DIAGNOSIS — K5730 Diverticulosis of large intestine without perforation or abscess without bleeding: Secondary | ICD-10-CM | POA: Diagnosis not present

## 2022-05-08 DIAGNOSIS — Z79899 Other long term (current) drug therapy: Secondary | ICD-10-CM | POA: Diagnosis not present

## 2022-05-08 DIAGNOSIS — N189 Chronic kidney disease, unspecified: Secondary | ICD-10-CM | POA: Diagnosis not present

## 2022-05-08 DIAGNOSIS — S82401A Unspecified fracture of shaft of right fibula, initial encounter for closed fracture: Secondary | ICD-10-CM | POA: Diagnosis not present

## 2022-05-08 DIAGNOSIS — I129 Hypertensive chronic kidney disease with stage 1 through stage 4 chronic kidney disease, or unspecified chronic kidney disease: Secondary | ICD-10-CM | POA: Diagnosis present

## 2022-05-08 DIAGNOSIS — I272 Pulmonary hypertension, unspecified: Secondary | ICD-10-CM | POA: Diagnosis not present

## 2022-05-08 DIAGNOSIS — S5291XA Unspecified fracture of right forearm, initial encounter for closed fracture: Secondary | ICD-10-CM | POA: Diagnosis present

## 2022-05-08 DIAGNOSIS — S82491A Other fracture of shaft of right fibula, initial encounter for closed fracture: Secondary | ICD-10-CM | POA: Diagnosis not present

## 2022-05-08 DIAGNOSIS — M7989 Other specified soft tissue disorders: Secondary | ICD-10-CM | POA: Diagnosis not present

## 2022-05-08 DIAGNOSIS — K8020 Calculus of gallbladder without cholecystitis without obstruction: Secondary | ICD-10-CM | POA: Diagnosis not present

## 2022-05-08 DIAGNOSIS — K5901 Slow transit constipation: Secondary | ICD-10-CM | POA: Diagnosis not present

## 2022-05-08 DIAGNOSIS — J90 Pleural effusion, not elsewhere classified: Secondary | ICD-10-CM | POA: Diagnosis not present

## 2022-05-08 DIAGNOSIS — E1151 Type 2 diabetes mellitus with diabetic peripheral angiopathy without gangrene: Secondary | ICD-10-CM | POA: Diagnosis not present

## 2022-05-08 DIAGNOSIS — Z9884 Bariatric surgery status: Secondary | ICD-10-CM

## 2022-05-08 DIAGNOSIS — Z87891 Personal history of nicotine dependence: Secondary | ICD-10-CM | POA: Diagnosis not present

## 2022-05-08 DIAGNOSIS — S52691A Other fracture of lower end of right ulna, initial encounter for closed fracture: Secondary | ICD-10-CM | POA: Diagnosis not present

## 2022-05-08 DIAGNOSIS — D649 Anemia, unspecified: Secondary | ICD-10-CM | POA: Diagnosis not present

## 2022-05-08 DIAGNOSIS — A499 Bacterial infection, unspecified: Secondary | ICD-10-CM | POA: Diagnosis not present

## 2022-05-08 DIAGNOSIS — F09 Unspecified mental disorder due to known physiological condition: Secondary | ICD-10-CM | POA: Diagnosis not present

## 2022-05-08 DIAGNOSIS — K219 Gastro-esophageal reflux disease without esophagitis: Secondary | ICD-10-CM | POA: Diagnosis present

## 2022-05-08 DIAGNOSIS — I10 Essential (primary) hypertension: Secondary | ICD-10-CM | POA: Diagnosis not present

## 2022-05-08 DIAGNOSIS — E119 Type 2 diabetes mellitus without complications: Secondary | ICD-10-CM

## 2022-05-08 DIAGNOSIS — R350 Frequency of micturition: Secondary | ICD-10-CM | POA: Diagnosis not present

## 2022-05-08 DIAGNOSIS — E1169 Type 2 diabetes mellitus with other specified complication: Secondary | ICD-10-CM | POA: Diagnosis not present

## 2022-05-08 DIAGNOSIS — T07XXXA Unspecified multiple injuries, initial encounter: Secondary | ICD-10-CM | POA: Diagnosis not present

## 2022-05-08 DIAGNOSIS — D72823 Leukemoid reaction: Secondary | ICD-10-CM | POA: Diagnosis not present

## 2022-05-08 DIAGNOSIS — R5381 Other malaise: Secondary | ICD-10-CM | POA: Diagnosis present

## 2022-05-08 DIAGNOSIS — S52591A Other fractures of lower end of right radius, initial encounter for closed fracture: Secondary | ICD-10-CM | POA: Diagnosis not present

## 2022-05-08 DIAGNOSIS — S82191A Other fracture of upper end of right tibia, initial encounter for closed fracture: Secondary | ICD-10-CM | POA: Diagnosis not present

## 2022-05-08 DIAGNOSIS — I959 Hypotension, unspecified: Secondary | ICD-10-CM | POA: Diagnosis not present

## 2022-05-08 DIAGNOSIS — S82201D Unspecified fracture of shaft of right tibia, subsequent encounter for closed fracture with routine healing: Secondary | ICD-10-CM | POA: Diagnosis not present

## 2022-05-08 DIAGNOSIS — S82101D Unspecified fracture of upper end of right tibia, subsequent encounter for closed fracture with routine healing: Secondary | ICD-10-CM | POA: Diagnosis not present

## 2022-05-08 DIAGNOSIS — N179 Acute kidney failure, unspecified: Secondary | ICD-10-CM | POA: Diagnosis not present

## 2022-05-08 DIAGNOSIS — E669 Obesity, unspecified: Secondary | ICD-10-CM | POA: Diagnosis not present

## 2022-05-08 DIAGNOSIS — T148XXA Other injury of unspecified body region, initial encounter: Secondary | ICD-10-CM | POA: Insufficient documentation

## 2022-05-08 DIAGNOSIS — S59291A Other physeal fracture of lower end of radius, right arm, initial encounter for closed fracture: Secondary | ICD-10-CM | POA: Diagnosis not present

## 2022-05-08 DIAGNOSIS — Z823 Family history of stroke: Secondary | ICD-10-CM

## 2022-05-08 DIAGNOSIS — N281 Cyst of kidney, acquired: Secondary | ICD-10-CM | POA: Diagnosis not present

## 2022-05-08 DIAGNOSIS — E860 Dehydration: Secondary | ICD-10-CM | POA: Diagnosis present

## 2022-05-08 DIAGNOSIS — S52501K Unspecified fracture of the lower end of right radius, subsequent encounter for closed fracture with nonunion: Secondary | ICD-10-CM | POA: Diagnosis not present

## 2022-05-08 DIAGNOSIS — M79661 Pain in right lower leg: Secondary | ICD-10-CM | POA: Diagnosis not present

## 2022-05-08 DIAGNOSIS — S82251A Displaced comminuted fracture of shaft of right tibia, initial encounter for closed fracture: Secondary | ICD-10-CM | POA: Diagnosis not present

## 2022-05-08 DIAGNOSIS — Z043 Encounter for examination and observation following other accident: Secondary | ICD-10-CM | POA: Diagnosis not present

## 2022-05-08 DIAGNOSIS — S52691P Other fracture of lower end of right ulna, subsequent encounter for closed fracture with malunion: Secondary | ICD-10-CM | POA: Diagnosis not present

## 2022-05-08 DIAGNOSIS — S82201S Unspecified fracture of shaft of right tibia, sequela: Secondary | ICD-10-CM | POA: Diagnosis not present

## 2022-05-08 DIAGNOSIS — N390 Urinary tract infection, site not specified: Secondary | ICD-10-CM | POA: Diagnosis not present

## 2022-05-08 DIAGNOSIS — Z833 Family history of diabetes mellitus: Secondary | ICD-10-CM

## 2022-05-08 DIAGNOSIS — I2510 Atherosclerotic heart disease of native coronary artery without angina pectoris: Secondary | ICD-10-CM | POA: Diagnosis present

## 2022-05-08 DIAGNOSIS — W19XXXA Unspecified fall, initial encounter: Secondary | ICD-10-CM | POA: Diagnosis not present

## 2022-05-08 DIAGNOSIS — R6889 Other general symptoms and signs: Secondary | ICD-10-CM | POA: Diagnosis not present

## 2022-05-08 LAB — CBC
HCT: 33.6 % — ABNORMAL LOW (ref 36.0–46.0)
Hemoglobin: 10.4 g/dL — ABNORMAL LOW (ref 12.0–15.0)
MCH: 23.3 pg — ABNORMAL LOW (ref 26.0–34.0)
MCHC: 31 g/dL (ref 30.0–36.0)
MCV: 75.3 fL — ABNORMAL LOW (ref 80.0–100.0)
Platelets: 155 10*3/uL (ref 150–400)
RBC: 4.46 MIL/uL (ref 3.87–5.11)
RDW: 16.1 % — ABNORMAL HIGH (ref 11.5–15.5)
WBC: 12.4 10*3/uL — ABNORMAL HIGH (ref 4.0–10.5)
nRBC: 0 % (ref 0.0–0.2)

## 2022-05-08 LAB — BASIC METABOLIC PANEL
Anion gap: 8 (ref 5–15)
BUN: 36 mg/dL — ABNORMAL HIGH (ref 8–23)
CO2: 20 mmol/L — ABNORMAL LOW (ref 22–32)
Calcium: 8.6 mg/dL — ABNORMAL LOW (ref 8.9–10.3)
Chloride: 113 mmol/L — ABNORMAL HIGH (ref 98–111)
Creatinine, Ser: 2.6 mg/dL — ABNORMAL HIGH (ref 0.44–1.00)
GFR, Estimated: 19 mL/min — ABNORMAL LOW (ref >60)
Glucose, Bld: 209 mg/dL — ABNORMAL HIGH (ref 70–99)
Potassium: 5.3 mmol/L — ABNORMAL HIGH (ref 3.5–5.1)
Sodium: 141 mmol/L (ref 135–145)

## 2022-05-08 LAB — CBG MONITORING, ED: Glucose-Capillary: 151 mg/dL — ABNORMAL HIGH (ref 70–99)

## 2022-05-08 LAB — GLUCOSE, CAPILLARY: Glucose-Capillary: 193 mg/dL — ABNORMAL HIGH (ref 70–99)

## 2022-05-08 MED ORDER — GABAPENTIN 100 MG PO CAPS
100.0000 mg | ORAL_CAPSULE | Freq: Every day | ORAL | Status: DC
  Administered 2022-05-08 – 2022-05-18 (×11): 100 mg via ORAL
  Filled 2022-05-08 (×11): qty 1

## 2022-05-08 MED ORDER — HYDROCODONE-ACETAMINOPHEN 5-325 MG PO TABS
1.0000 | ORAL_TABLET | Freq: Four times a day (QID) | ORAL | Status: DC | PRN
  Administered 2022-05-08 – 2022-05-10 (×4): 1 via ORAL
  Filled 2022-05-08 (×4): qty 1

## 2022-05-08 MED ORDER — MORPHINE SULFATE (PF) 2 MG/ML IV SOLN
2.0000 mg | Freq: Once | INTRAVENOUS | Status: AC
  Administered 2022-05-08: 2 mg via INTRAVENOUS
  Filled 2022-05-08: qty 1

## 2022-05-08 MED ORDER — AMLODIPINE BESYLATE 5 MG PO TABS
2.5000 mg | ORAL_TABLET | Freq: Every day | ORAL | Status: DC
  Administered 2022-05-09 – 2022-05-17 (×9): 2.5 mg via ORAL
  Filled 2022-05-08 (×9): qty 1

## 2022-05-08 MED ORDER — MORPHINE SULFATE (PF) 2 MG/ML IV SOLN
1.0000 mg | INTRAVENOUS | Status: DC | PRN
  Administered 2022-05-09: 1 mg via INTRAVENOUS
  Filled 2022-05-08: qty 1

## 2022-05-08 MED ORDER — METOPROLOL TARTRATE 50 MG PO TABS
50.0000 mg | ORAL_TABLET | Freq: Two times a day (BID) | ORAL | Status: DC
  Administered 2022-05-08 – 2022-05-17 (×18): 50 mg via ORAL
  Filled 2022-05-08 (×6): qty 1
  Filled 2022-05-08: qty 2
  Filled 2022-05-08 (×2): qty 1
  Filled 2022-05-08: qty 2
  Filled 2022-05-08 (×2): qty 1
  Filled 2022-05-08: qty 2
  Filled 2022-05-08: qty 1
  Filled 2022-05-08: qty 2
  Filled 2022-05-08 (×3): qty 1

## 2022-05-08 MED ORDER — ONDANSETRON HCL 4 MG/2ML IJ SOLN
4.0000 mg | Freq: Once | INTRAMUSCULAR | Status: AC
  Administered 2022-05-08: 4 mg via INTRAVENOUS
  Filled 2022-05-08: qty 2

## 2022-05-08 MED ORDER — ACETAMINOPHEN 325 MG PO TABS: 650.0000 mg | ORAL_TABLET | Freq: Four times a day (QID) | ORAL | Status: DC | PRN

## 2022-05-08 MED ORDER — CALCITRIOL 0.25 MCG PO CAPS
0.2500 ug | ORAL_CAPSULE | Freq: Every day | ORAL | Status: DC
  Administered 2022-05-09 – 2022-05-19 (×11): 0.25 ug via ORAL
  Filled 2022-05-08 (×11): qty 1

## 2022-05-08 MED ORDER — HYDROMORPHONE HCL 1 MG/ML IJ SOLN
0.5000 mg | Freq: Once | INTRAMUSCULAR | Status: AC
  Administered 2022-05-08: 0.5 mg via INTRAVENOUS
  Filled 2022-05-08: qty 1

## 2022-05-08 MED ORDER — ATORVASTATIN CALCIUM 20 MG PO TABS
20.0000 mg | ORAL_TABLET | Freq: Every day | ORAL | Status: DC
  Administered 2022-05-09 – 2022-05-19 (×11): 20 mg via ORAL
  Filled 2022-05-08 (×11): qty 1

## 2022-05-08 MED ORDER — INSULIN ASPART 100 UNIT/ML IJ SOLN
0.0000 [IU] | Freq: Three times a day (TID) | INTRAMUSCULAR | Status: DC
  Administered 2022-05-09: 1 [IU] via SUBCUTANEOUS
  Administered 2022-05-09 (×2): 2 [IU] via SUBCUTANEOUS
  Administered 2022-05-10: 7 [IU] via SUBCUTANEOUS
  Administered 2022-05-10: 2 [IU] via SUBCUTANEOUS
  Filled 2022-05-08: qty 0.09

## 2022-05-08 NOTE — ED Provider Notes (Signed)
[facility] DEPT Provider Note   CSN: [id number] Arrival date & time: 05/08/22  1411     History  Chief Complaint  Patient presents with   [name]    Miranda Roman is a 76 y.o. female.  Patient s/p fall. Tripped over dogs, fell in home, and c/o right wrist and right proximal lower leg pain s/p fall. Pain constant, dull, moderate-sev, worse w movement. Skin intact. No numbness/weakness. No anticoagulant use. Denies head injury, loc, or headache. No neck or back pain. No chest pain or sob. No abd pain or nv. Felt fine, at baseline, prior to fall. Normally walks w cane or walker. Lives w daughter and her family.   The history is provided by the patient, the EMS personnel and a relative.  Fall Pertinent negatives include no chest pain, no abdominal pain, no headaches and no shortness of breath.       Home Medications Prior to Admission medications   Medication Sig Start Date End Date Taking? Authorizing Provider  acetaminophen (TYLENOL) 500 MG tablet Take 1 tablet (500 mg total) by mouth every 6 (six) hours as needed for moderate pain or fever. 02/14/20   Oswald Hillock, MD  amLODipine (NORVASC) 2.5 MG tablet Take 2.5 mg by mouth daily. 09/13/20   [provider]  atorvastatin (LIPITOR) 20 MG tablet TAKE ONE TABLET BY MOUTH MONDAY THRU FRIDAY AND SKIP WEEKENDS 09/30/21   Glendale Chard, MD  AZO-CRANBERRY PO Take 1 capsule by mouth daily at 6 (six) AM.    [provider]  b complex vitamins capsule Take 1 capsule by mouth daily.    [provider]  BD INSULIN SYRINGE U/F 31G X 5/16" 0.3 ML MISC See admin instructions. use with insulin 03/17/20   [provider]  calcitRIOL (ROCALTROL) 0.25 MCG capsule Take 0.25 mcg by mouth daily. 08/27/20   [provider]  cetirizine (ZYRTEC) 10 MG tablet Take 10 mg by mouth daily.    [provider]  Cyanocobalamin (VITAMIN B12) 1000 MCG TBCR 1 tablet    [provider]  diclofenac Sodium (VOLTAREN) 1 % GEL Apply 2 g topically 4 (four) times daily. Apply to left knee. 09/12/21   Minette Brine, FNP  epoetin alfa (PROCRIT) 01586 UNIT/ML injection Every other week    [provider]  gabapentin (NEURONTIN) 100 MG capsule TAKE 1 CAPSULE BY MOUTH AT BEDTIME 02/24/22   Glendale Chard, MD  HUMALOG KWIKPEN 100 UNIT/ML KwikPen INJECT SUBCUTANEOUSLY 4 UNITS 3  TIMES DAILY WITH MEALS 04/21/22   Glendale Chard, MD  insulin detemir (LEVEMIR FLEXTOUCH) 100 UNIT/ML FlexPen Inject 6 Units into the skin daily. 04/05/22   Glendale Chard, MD  Insulin Pen Needle (BD PEN NEEDLE NANO U/F) 32G X 4 MM MISC Use as directed with insulin pen 04/21/22   Glendale Chard, MD  LEVEMIR 100 UNIT/ML injection INJECT 0.12 MLS (12 UNITS TOTAL) INTO THE SKIN 2 (TWO) TIMES DAILY. 01/12/22   Glendale Chard, MD  metoprolol tartrate (LOPRESSOR) 50 MG tablet TAKE 1 TABLET BY MOUTH TWICE A DAY 01/06/22   Glendale Chard, MD  Multiple Vitamins-Minerals (CENTRUM SILVER PO) Take by mouth daily.    [provider]  ofloxacin (OCUFLOX) 0.3 % ophthalmic solution  07/10/21   [provider]  OneTouch Delica Lancets 82B MISC See admin instructions. 02/09/21   [provider]  sodium bicarbonate 650 MG tablet Take 650 mg by mouth 2 (two) times daily. 08/23/20   [provider]  Allergies    Cefepime and Lactose intolerance (gi)    Review of Systems   Review of Systems  Constitutional:  Negative for fever.  HENT:  Negative for nosebleeds.   Eyes:  Negative for visual disturbance.  Respiratory:  Negative for shortness of breath.   Cardiovascular:  Negative for chest pain.  Gastrointestinal:  Negative for abdominal pain, nausea and vomiting.  Genitourinary:  Negative for flank pain.  Musculoskeletal:  Negative for back pain and neck pain.  Skin:  Negative for wound.  Neurological:  Negative for weakness, numbness and headaches.  Hematological:  Does not  bruise/bleed easily.  Psychiatric/Behavioral:  Negative for confusion.     Physical Exam Updated Vital Signs BP (!) 146/92 (BP Location: Left Arm)   Pulse 64   Temp 97.6 F (36.4 C) (Oral)   Resp 18   SpO2 99%  Physical Exam Vitals and nursing note reviewed.  Constitutional:      Appearance: Normal appearance. She is well-developed.  HENT:     Head: Atraumatic.     Nose: Nose normal.     Mouth/Throat:     Mouth: Mucous membranes are moist.  Eyes:     General: No scleral icterus.    Pupils: Pupils are equal, round, and reactive to light.  Neck:     Trachea: No tracheal deviation.  Cardiovascular:     Rate and Rhythm: Normal rate and regular rhythm.     Pulses: Normal pulses.     Heart sounds: Normal heart sounds. No murmur heard.    No friction rub. No gallop.  Pulmonary:     Effort: Pulmonary effort is normal. No respiratory distress.     Breath sounds: Normal breath sounds.  Chest:     Chest wall: No tenderness.  Abdominal:     General: There is no distension.     Palpations: Abdomen is soft.     Tenderness: There is no abdominal tenderness.  Musculoskeletal:        General: No swelling.     Cervical back: Normal range of motion and neck supple. No rigidity or tenderness. No muscular tenderness.     Comments: CTLS spine, non tender, aligned, no step off. Sts and tenderness right wrist, radial pulse 2+. Swelling is mild-mod, compartments of forearm and soft, not tense. Skin intact. Sts and tenderness right proximal tib/fib region. Compartments of lower leg are soft, not tense. Skin intact. Distal pulses palp. No other focal pain or bony tenderness on bil extremity exam.   Skin:    General: Skin is warm and dry.     Findings: No rash.  Neurological:     Mental Status: She is alert.     Comments: Alert, speech normal.  GCS 15. Motor/sens grossly intact bil. RUE/RLE, nvi.   Psychiatric:        Mood and Affect: Mood normal.     ED Results / Procedures / Treatments    Labs (all labs ordered are listed, but only abnormal results are displayed) Results for orders placed or performed during the hospital encounter of 04/21/22  Iron and TIBC  Result Value Ref Range   Iron 73 28 - 170 ug/dL   TIBC 227 (L) 250 - 450 ug/dL   Saturation Ratios 32 (H) 10.4 - 31.8 %   UIBC 154 ug/dL  Ferritin  Result Value Ref Range   Ferritin 146 11 - 307 ng/mL  Hemoglobin-hemacue, POC  Result Value Ref Range   Hemoglobin 10.0 (L) 12.0 - 15.0  g/dL      EKG None  Radiology No results found.  Procedures Procedures    Medications Ordered in ED Medications  HYDROmorphone (DILAUDID) injection 0.5 mg (has no administration in time range)  ondansetron (ZOFRAN) injection 4 mg (has no administration in time range)    ED Course/ Medical Decision Making/ A&P Clinical Course as of 05/08/22 1529  Thu May 08, 2022  1501 Stable 75 YOF with likely wrist and tibia fx.  [CC]    Clinical Course User Index [CC] Tretha Sciara, MD                           Medical Decision Making Amount and/or Complexity of Data Reviewed Labs: ordered. Radiology: ordered.  Risk Prescription drug management.   Iv ns. Continuous pulse ox and cardiac monitoring. Labs ordered/sent. Imaging ordered.   Diff dx includes tibia fx, wrist fx, etc - dispo decision including potential need for admission for operative repair, PT,  considered - will get imaging studies and labs and reassess.   Dilaudid .5 mg iv. Zofran iv.   Pain improved.  Reviewed nursing notes and prior charts for additional history. External reports reviewed. Additional history from:  Cardiac monitor: sinus rhythm, rate 66.  Labs reviewed/interpreted by me - pnd  Xrays reviewed/interpreted by me - pnd  1530, xrays and labs pending - signed out to Dr Oswald Hillock to check labs and imaging, discuss w ortho, most likely plan of admission.           Final Clinical Impression(s) / ED Diagnoses Final  diagnoses:  None    Rx / DC Orders ED Discharge Orders     None         Lajean Saver, MD 05/08/22 1531

## 2022-05-08 NOTE — Assessment & Plan Note (Addendum)
-  Right Tib/Fib X-ray showed acute mildly to moderately comminuted and displaced fracture of the proximal tibial metadiaphysis.  -Acute mildly displaced and mild impacted fx of the proximal fibular metaphysis.  -Ortho was consulted and will tried to operate in the next 2 days due to scheduling.Wants to keep NPO midnight in case of opening in schedule -PRN Tylenol, hydrocodone, and low dose IV morphine for pain- daughter concerned about high dose opioids since she had severe encephalopathy in the past from either opioids or antibiotics. Suspect pt is high risk for inpatient delirium overall.

## 2022-05-08 NOTE — Assessment & Plan Note (Signed)
Stat left wrist X-ray

## 2022-05-08 NOTE — Assessment & Plan Note (Signed)
-  Right wrist with acute fracture of the distal radial metaphysis with mild volar apex angulation. -Acute nondisplaced fx of the distal ulnar metaphysis.  Hand surgery Dr. Greta Doom consulted. Fractures are non-operable and place on splint. Need to follow up outpatient

## 2022-05-08 NOTE — ED Triage Notes (Signed)
Pt BIB [facility] from home for mechanical fall. Pt has deformity to RLE and RUE, hx of dementia.

## 2022-05-08 NOTE — Assessment & Plan Note (Signed)
Continue home meds pending med rec

## 2022-05-08 NOTE — H&P (Signed)
History and Physical    Patient: Miranda Roman TMH:[id number] DOB: 10/09/1945 DOA: 05/08/2022 DOS: the patient was seen and examined on 05/08/2022 PCP: Glendale Chard, MD  Patient coming from: Home  Chief Complaint:  Chief Complaint  Patient presents with   Fall   HPI: Miranda Roman is a 76 y.o. female with medical history significant of mild neurocognitive disorder, HTN, pulmonary HTN, bronchiectasis, T2DM, CKD4 who presents after a fall.   Pt just moved in to live with daughter and was tripped by 2 dogs today and fell. Did not hit her head or lose consciousness. She normally ambulates with walker but was not doing so at the time. Not on aspirin or anticoagulation. Not complaining of much pain other than left wrist pain on my evaluation.  In the ED, afebrile, normotensive on room air.   WBC 12.4. Hgb of 10.4. BMP pending.   Right Tib/Fib X-ray showed acute mildly to moderately comminuted and displaced fracture of the proximal tibial metadiaphysis.  Acute mildly displaced and mild impacted fx of the proximal fibular metaphysis.  Ortho was consulted and will tried to operated in the next 2 days due to scheduling.  Right wrist with acute fracture of the distal radial metaphysis with mild volar apex angulation. Acute nondisplaced fx of the distal ulnar metaphysis.  Hand surgery Dr. Greta Doom consulted. Fractures are non-operable and place on splint.   Review of Systems: As mentioned in the history of present illness. All other systems reviewed and are negative. 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   Borderline steroid-induced glaucoma, 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   OD, Early elevated intraocular pressure to 28 mm we will discontinue all topical medications from surgery in the right eye today   Bronchiectasis without complication 92/05/9416   Cataract 07/03/2020   Chest pain at rest 01/30/2015   Chronic kidney disease, stage 4 (severe) 12/07/2020   Closed fracture of right tibial plateau 07/17/2021   Cyst of left kidney 03/10/2019   Needs repeated US 08/2019   Dysphagia, neurologic    Elevated d-dimer 12/20/2019   Facet arthritis of lumbar region 02/27/2019   Gastroesophageal reflux disease 08/26/2016   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  Further history patient suffered possible Wernicke's encephalopathy the past, a type of the vitamin deficiencies, thiamine that can also affect the retina if there is significant damage which may not be recoverable.   Mild neurocognitive disorder due to multiple etiologies 01/23/2022   Osteoporosis 12/15/2018   Overweight with body mass index (BMI) 25.0-29.9  12/15/2018   PAD (peripheral artery disease) 06/06/2019   Pain in right hand 04/27/2020   Pancreatic cyst 06/06/2019   Polyneuropathy due to type 2 diabetes mellitus    Posterior vitreous detachment of left eye 08/05/2021   Proliferative diabetic retinopathy of right eye  08/22/2021   Likely contributory cause of nonclearing vitreous hemorrhage in the right eye now status post PRP likely to resolve in 2 quiescent PDR, post vitrectomy PRP   Pulmonary hypertension 02/04/2021   Right knee pain 07/10/2021   Sickle-cell trait 12/07/2020   Type II diabetes mellitus 03/10/2012   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   Vitreous hemorrhage of right eye 08/05/2021   Vitrectomy PRP 08-14-2021   Past Surgical History:  Procedure Laterality Date   BONE RESECTION  01/2006   wrist   GASTRIC BYPASS  08/14/2009   OTHER SURGICAL HISTORY     sickle cell retinopathy laser surgery   TUBAL LIGATION  04/28/1977   Social History:  reports that she quit smoking about 17 years ago. Her smoking use included cigarettes. She started smoking about 60 years ago. She has a 10.75 pack-year smoking history. She has never used smokeless tobacco. She reports that she does not currently use alcohol. She reports that she does not use drugs.  Allergies  Allergen Reactions   Cefepime Other (See Comments)    Causes encephalitis - reported by [facility] 12/31/2019   Lactose Intolerance (Gi)     Per pt's daughter causes gas    Family History  Problem Relation Age of Onset   Diabetes Mother    Heart disease Mother    Hypertension Mother    Stroke Mother    Diabetes Father    Heart disease Father    Diabetes Sister    Diabetes Brother    Hypertension Brother    Sickle cell anemia Brother    Diabetes Maternal Aunt    Diabetes Maternal Uncle    Diabetes Maternal Grandmother    Diabetes Maternal Grandfather    Sickle cell anemia Sister    Sickle cell anemia Sister    Healthy Son    Healthy Son    Healthy Daughter     Prior to Admission medications   Medication Sig Start Date End Date Taking? Authorizing Provider  acetaminophen (TYLENOL) 500 MG tablet Take 1 tablet (500 mg total) by mouth every 6 (six) hours as needed for moderate pain or fever.  02/14/20   Oswald Hillock, MD  amLODipine (NORVASC) 2.5 MG tablet Take 2.5 mg by mouth daily. 09/13/20   [provider]  atorvastatin (LIPITOR) 20 MG tablet TAKE ONE TABLET BY MOUTH MONDAY THRU FRIDAY AND SKIP WEEKENDS 09/30/21   Glendale Chard, MD  AZO-CRANBERRY PO Take 1 capsule by mouth daily at 6 (six) AM.    [provider]  b complex vitamins capsule Take 1 capsule by mouth daily.    [provider]  BD INSULIN SYRINGE U/F 31G X 5/16" 0.3 ML MISC See admin instructions. use with insulin 03/17/20   [provider]  calcitRIOL (ROCALTROL) 0.25 MCG capsule Take 0.25 mcg by mouth daily. 08/27/20   [provider]  cetirizine (ZYRTEC) 10 MG tablet Take 10 mg by mouth daily.    [provider]  Cyanocobalamin (VITAMIN B12) 1000 MCG TBCR 1 tablet    [provider]  diclofenac Sodium (VOLTAREN) 1 % GEL Apply 2 g topically 4 (four) times daily. Apply to left knee. 09/12/21  Minette Brine, FNP  epoetin alfa (PROCRIT) 48185 UNIT/ML injection Every other week    [provider]  gabapentin (NEURONTIN) 100 MG capsule TAKE 1 CAPSULE BY MOUTH AT BEDTIME 02/24/22   Glendale Chard, MD  HUMALOG KWIKPEN 100 UNIT/ML KwikPen INJECT SUBCUTANEOUSLY 4 UNITS 3  TIMES DAILY WITH MEALS 04/21/22   Glendale Chard, MD  insulin detemir (LEVEMIR FLEXTOUCH) 100 UNIT/ML FlexPen Inject 6 Units into the skin daily. 04/05/22   Glendale Chard, MD  Insulin Pen Needle (BD PEN NEEDLE NANO U/F) 32G X 4 MM MISC Use as directed with insulin pen 04/21/22   Glendale Chard, MD  LEVEMIR 100 UNIT/ML injection INJECT 0.12 MLS (12 UNITS TOTAL) INTO THE SKIN 2 (TWO) TIMES DAILY. 01/12/22   Glendale Chard, MD  metoprolol tartrate (LOPRESSOR) 50 MG tablet TAKE 1 TABLET BY MOUTH TWICE A DAY 01/06/22   Glendale Chard, MD  Multiple Vitamins-Minerals (CENTRUM SILVER PO) Take by mouth daily.    [provider]  ofloxacin (OCUFLOX) 0.3 % ophthalmic solution  07/10/21   [provider]  OneTouch Delica Lancets 90B MISC See admin instructions. 02/09/21   [provider]  sodium bicarbonate 650 MG tablet Take 650 mg by mouth 2 (two) times daily. 08/23/20   [provider]    Physical Exam: Vitals:   05/08/22 1530 05/08/22 1630 05/08/22 1730 05/08/22 1830  BP: (!) 164/88 (!) 145/75 135/76 122/71  Pulse: 67 67 75 75  Resp: _0 Temp:   97.8 F (36.6 C)   TempSrc:      SpO2: 100% 100% 100% 100%   Constitutional: NAD, calm, comfortable, elderly female laying flat in bed. Eyes:  lids and conjunctivae normal ENMT: Mucous membranes are moist.  Neck: normal, supple Respiratory: clear to auscultation bilaterally, no wheezing, no crackles. Normal respiratory effort. No accessory muscle use.  Cardiovascular: Regular rate and rhythm, no murmurs / rubs / gallops. No extremity edema.   Abdomen: no tenderness,  Bowel sounds positive.  Musculoskeletal: no clubbing / cyanosis. No joint deformity upper and lower extremities. Right upper and lower extremity wrapped in splint and ace bandage with normal <2 sec capillary refill. Normal passive ROM of left wrist but is complaining of pain to anterior wrist. Skin: no rashes, lesions, ulcers.  Neurologic: CN 2-12 grossly intact. Sensation intact, Strength 5/5 in all 4. Alert and oriented x 4 but appears forgetful at times. Psychiatric: Normal judgment and insight. Alert and oriented x 3. Normal mood. Data Reviewed:  See HPI  Assessment and Plan: * Tibia/fibula fracture, right, closed, initial encounter -Right Tib/Fib X-ray showed acute mildly to moderately comminuted and displaced fracture of the proximal tibial metadiaphysis.  -Acute mildly displaced and mild impacted fx of the proximal fibular metaphysis.  -Ortho was consulted and will tried to operate in the next 2 days due to scheduling.Wants to keep NPO midnight in case of opening in schedule -PRN Tylenol, hydrocodone, and low dose IV morphine  for pain- daughter concerned about high dose opioids since she had severe encephalopathy in the past from either opioids or antibiotics    Left wrist pain Stat left wrist X-ray  Right radial fracture -Right wrist with acute fracture of the distal radial metaphysis with mild volar apex angulation. -Acute nondisplaced fx of the distal ulnar metaphysis.  Hand surgery Dr. Greta Doom consulted. Fractures are non-operable and place on splint. Need to follow up outpatient  Benign essential hypertension Continue home meds pending med rec  Type II diabetes mellitus  Controlled. Place on sensitive SSI at mealtime.      Advance Care Planning:   Code Status: Full Code   Consults: ortho, hand surgery  Family Communication: daughter at bedside  Severity of Illness: The appropriate patient status for this patient is INPATIENT. Inpatient status is judged to be reasonable and necessary in order to provide the required intensity of service to ensure the patient's safety. The patient's presenting symptoms, physical exam findings, and initial radiographic and laboratory data in the context of their chronic comorbidities is felt to place them at high risk for further clinical deterioration. Furthermore, it is not anticipated that the patient will be medically stable for discharge from the hospital within 2 midnights of admission.   * I certify that at the point of admission it is my clinical judgment that the patient will require inpatient hospital care spanning beyond 2 midnights from the point of admission due to high intensity of service, high risk for further deterioration and high frequency of surveillance required.*  Author: Orene Desanctis, DO 05/08/2022 8:34 PM  For on call review [email or web address].

## 2022-05-08 NOTE — ED Notes (Signed)
Unsuccessful IV attempt x2.  

## 2022-05-08 NOTE — ED Provider Notes (Signed)
Clinical Course as of 05/08/22 1637  Thu May 08, 2022  1501 Stable 2 YOF with likely wrist and tibia fx.  [CC]    Clinical Course User Index [CC] Tretha Sciara, MD   Patient required joint consultation between orthopedic surgery, hand surgery and hospitalist.  Ultimately the leg fracture is the most likely to require surgical intervention.  We will plan for admission for ongoing care and management at this time.  Patient arranged for admission with no further acute events.   Tretha Sciara, MD 05/08/22 [phone]

## 2022-05-08 NOTE — Assessment & Plan Note (Signed)
Controlled. Place on sensitive SSI at mealtime.

## 2022-05-09 ENCOUNTER — Inpatient Hospital Stay (HOSPITAL_COMMUNITY): Payer: Medicare Other

## 2022-05-09 DIAGNOSIS — R030 Elevated blood-pressure reading, without diagnosis of hypertension: Secondary | ICD-10-CM | POA: Diagnosis not present

## 2022-05-09 DIAGNOSIS — W010XXA Fall on same level from slipping, tripping and stumbling without subsequent striking against object, initial encounter: Secondary | ICD-10-CM | POA: Diagnosis not present

## 2022-05-09 DIAGNOSIS — S82201A Unspecified fracture of shaft of right tibia, initial encounter for closed fracture: Secondary | ICD-10-CM | POA: Diagnosis not present

## 2022-05-09 DIAGNOSIS — S82401A Unspecified fracture of shaft of right fibula, initial encounter for closed fracture: Secondary | ICD-10-CM | POA: Diagnosis not present

## 2022-05-09 LAB — BASIC METABOLIC PANEL
Anion gap: 7 (ref 5–15)
BUN: 40 mg/dL — ABNORMAL HIGH (ref 8–23)
CO2: 21 mmol/L — ABNORMAL LOW (ref 22–32)
Calcium: 8.2 mg/dL — ABNORMAL LOW (ref 8.9–10.3)
Chloride: 110 mmol/L (ref 98–111)
Creatinine, Ser: 2.73 mg/dL — ABNORMAL HIGH (ref 0.44–1.00)
GFR, Estimated: 18 mL/min — ABNORMAL LOW (ref >60)
Glucose, Bld: 182 mg/dL — ABNORMAL HIGH (ref 70–99)
Potassium: 5.8 mmol/L — ABNORMAL HIGH (ref 3.5–5.1)
Sodium: 138 mmol/L (ref 135–145)

## 2022-05-09 LAB — CBC
HCT: 31.4 % — ABNORMAL LOW (ref 36.0–46.0)
Hemoglobin: 9.9 g/dL — ABNORMAL LOW (ref 12.0–15.0)
MCH: 23.6 pg — ABNORMAL LOW (ref 26.0–34.0)
MCHC: 31.5 g/dL (ref 30.0–36.0)
MCV: 74.8 fL — ABNORMAL LOW (ref 80.0–100.0)
Platelets: 145 10*3/uL — ABNORMAL LOW (ref 150–400)
RBC: 4.2 MIL/uL (ref 3.87–5.11)
RDW: 15.9 % — ABNORMAL HIGH (ref 11.5–15.5)
WBC: 8.3 10*3/uL (ref 4.0–10.5)
nRBC: 0 % (ref 0.0–0.2)

## 2022-05-09 LAB — GLUCOSE, CAPILLARY
Glucose-Capillary: 171 mg/dL — ABNORMAL HIGH (ref 70–99)
Glucose-Capillary: 136 mg/dL — ABNORMAL HIGH (ref 70–99)
Glucose-Capillary: 200 mg/dL — ABNORMAL HIGH (ref 70–99)
Glucose-Capillary: 330 mg/dL — ABNORMAL HIGH (ref 70–99)

## 2022-05-09 MED ORDER — SODIUM ZIRCONIUM CYCLOSILICATE 5 G PO PACK
5.0000 g | PACK | Freq: Once | ORAL | Status: AC
  Administered 2022-05-09: 5 g via ORAL
  Filled 2022-05-09: qty 1

## 2022-05-09 MED ORDER — VITAMIN B-12 1000 MCG PO TABS
1000.0000 ug | ORAL_TABLET | Freq: Every day | ORAL | Status: DC
  Administered 2022-05-09 – 2022-05-19 (×11): 1000 ug via ORAL
  Filled 2022-05-09 (×11): qty 1

## 2022-05-09 MED ORDER — LORATADINE 10 MG PO TABS
10.0000 mg | ORAL_TABLET | Freq: Every day | ORAL | Status: DC
  Administered 2022-05-09 – 2022-05-13 (×4): 10 mg via ORAL
  Filled 2022-05-09 (×7): qty 1

## 2022-05-09 MED ORDER — LACTATED RINGERS IV BOLUS
500.0000 mL | Freq: Once | INTRAVENOUS | Status: AC
  Administered 2022-05-09: 500 mL via INTRAVENOUS

## 2022-05-09 MED ORDER — SODIUM BICARBONATE 650 MG PO TABS
650.0000 mg | ORAL_TABLET | Freq: Two times a day (BID) | ORAL | Status: DC
  Administered 2022-05-09 – 2022-05-19 (×22): 650 mg via ORAL
  Filled 2022-05-09 (×23): qty 1

## 2022-05-09 NOTE — Consult Note (Signed)
ORTHOPAEDIC CONSULTATION  REQUESTING PHYSICIAN: Miranda Hazel, MD  Chief Complaint: Fall with right tibia fracture  HPI: Miranda Roman is a 76 y.o. female with a history of encephalopathy, CKD stage IV, diabetes, hypertension pulmonary hypertension who sustained a fall yesterday injuring her right leg and right wrist.  She lives with her daughter who is her caretaker.  She at baseline ambulates with a walker but was not using one yesterday when she fell.  IN the emergency room she was found to have a right tib-fib fracture and placed into a long-leg splint.  She is comfortable in bed at this point but does not tolerate any movement of her right leg.  She denies distal numbness and tingling.  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   Borderline steroid-induced glaucoma, 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   OD, Early elevated intraocular pressure to 28 mm we will discontinue all topical medications from surgery in the right eye today   Bronchiectasis without complication 82/50/5397   Cataract 07/03/2020   Chest pain at rest 01/30/2015   Chronic kidney disease, stage 4 (severe) 12/07/2020   Closed fracture of right tibial plateau 07/17/2021   Cyst of left kidney 03/10/2019   Needs repeated US 08/2019   Dysphagia, neurologic    Elevated d-dimer 12/20/2019   Facet arthritis of lumbar region 02/27/2019   Gastroesophageal reflux disease 08/26/2016   History of  gastric bypass 08/14/2009   Hyperglycemia due to type 2 diabetes mellitus 12/07/2020   Hyperlipidemia    Hypertensive nephropathy 10/15/2017   Inadequate oral nutritional intake    Leukocytosis 12/22/2019   Long term (current) use of insulin 12/07/2020   Low grade squamous intraepithelial lesion (LGSIL) on cervicovaginal cytologic smear 12/15/2018   Lumbago 02/27/2019   Macular atrophy, 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  Further history patient suffered possible Wernicke's encephalopathy the past, a type of the vitamin deficiencies, thiamine that can also affect the retina if there is significant damage which may not be recoverable.   Mild neurocognitive disorder due to multiple etiologies 01/23/2022   Osteoporosis 12/15/2018   Overweight with body mass index (BMI) 25.0-29.9 12/15/2018   PAD (peripheral artery disease) 06/06/2019   Pain in right hand 04/27/2020   Pancreatic cyst 06/06/2019   Polyneuropathy due to type 2 diabetes mellitus    Posterior vitreous detachment of left eye 08/05/2021   Proliferative diabetic retinopathy of right eye 08/22/2021   Likely contributory cause of nonclearing vitreous hemorrhage in the right eye now status post PRP likely to resolve in 2 quiescent PDR, post vitrectomy PRP   Pulmonary hypertension 02/04/2021   Right knee pain 07/10/2021   Sickle-cell trait 12/07/2020   Type II diabetes mellitus 03/10/2012   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   Vitreous hemorrhage of right eye 08/05/2021   Vitrectomy PRP 08-14-2021   Past Surgical History:  Procedure Laterality Date  BONE RESECTION  01/2006   wrist   GASTRIC BYPASS  08/14/2009   OTHER SURGICAL HISTORY     sickle cell retinopathy laser surgery   TUBAL LIGATION  04/28/1977   Social History   Socioeconomic History   Marital status: Widowed    Spouse name: Not on file   Number of children:  Not on file   Years of education: 16   Highest education level: Bachelor's degree (e.g., BA, AB, BS)  Occupational History   Occupation: Retired    Comment: banking  Tobacco Use   Smoking status: Former    Packs/day: 0.25    Years: 43.00    Total pack years: 10.75    Types: Cigarettes    Start date: 04/1962    Quit date: 04/2005    Years since quitting: 17.1   Smokeless tobacco: Never  Vaping Use   Vaping Use: Never used  Substance and Sexual Activity   Alcohol use: Not Currently    Comment: occasionally   Drug use: No   Sexual activity: Not Currently  Other Topics Concern   Not on file  Social History Narrative   Right handed   Lives with Daughter in one story   Social Determinants of Health   Financial Resource Strain: [place]  (01/29/2022)   Overall Financial Resource Strain (CARDIA)    Difficulty of Paying Living Expenses: Not hard at all  Food Insecurity: No Food Insecurity (05/08/2022)   Hunger Vital Sign    Worried About Running Out of Food in the Last Year: Never true    Ran Out of Food in the Last Year: Never true  Transportation Needs: No Transportation Needs (05/08/2022)   PRAPARE - Hydrologist (Medical): No    Lack of Transportation (Non-Medical): No  Physical Activity: Inactive (01/29/2022)   Exercise Vital Sign    Days of Exercise per Week: 0 days    Minutes of Exercise per Session: 0 min  Stress: No Stress Concern Present (01/29/2022)   Miranda Roman    Feeling of Stress : Not at all  Social Connections: Not on file   Family History  Problem Relation Age of Onset   Diabetes Mother    Heart disease Mother    Hypertension Mother    Stroke Mother    Diabetes Father    Heart disease Father    Diabetes Sister    Diabetes Brother    Hypertension Brother    Sickle cell anemia Brother    Diabetes Maternal Aunt    Diabetes Maternal Uncle    Diabetes Maternal  Grandmother    Diabetes Maternal Grandfather    Sickle cell anemia Sister    Sickle cell anemia Sister    Healthy Son    Healthy Son    Healthy Daughter    Allergies  Allergen Reactions   Cefepime Other (See Comments)    Causes encephalitis - reported by [facility] 12/31/2019   Lactose Intolerance (Gi)     Per pt's daughter causes gas     Positive ROS: All other systems have been reviewed and were otherwise negative with the exception of those mentioned in the HPI and as above.  Physical Exam: General: Alert, no acute distress Cardiovascular: No pedal edema Respiratory: No cyanosis, no use of accessory musculature Skin: No lesions in the area of chief complaint Neurologic: Sensation intact distally  MUSCULOSKELETAL:  RLE long-leg splint clean, dry, and intact  Tender about the lower leg, moderate swelling about the lower leg  Does not tolerate any manipulation of the right leg in the splint.  No knee or ankle effusion  Knee stable to varus/ valgus stress  Sens DPN, SPN, TN intact  Motor EHL, ext, flex 5/5  DP 2+, No significant edema   IMAGING: Strays right tibia and fibula reviewed demonstrate mildly displaced right tibia and fibula fracture  Assessment: Principal Problem:   Tibia/fibula fracture, right, closed, initial encounter Active Problems:   Type II diabetes mellitus   Benign essential hypertension   Right radial fracture   Right distal ulnar fracture   Left wrist pain  Right tibia and fibula fracture  Plan: Discussed with the daughter and son this afternoon regarding the patient's injury.  She has a right tibia and fibula fracture that overall is well aligned.  If she could tolerate some mobility and be comfortable in the splint alone could expect good healing with nonoperative treatment.  However, if unable to move or mobilize from the bed would benefit from surgical fixation.  Discussed open reduction internal fixation would act as an internal cast to  allow the fracture to heal.  Would have the knee and ankle free to allow for range of motion as well as only a soft dressing as opposed to a heavy splint.  Risks of surgery are elevated given her kidney disease, vascular disease, and diabetes notably risk of wound healing and infection.  At this point she has a good palpable pulse and well-perfused foot, but more concerned about microvascular disease given her comorbidities. We will make patient n.p.o. at midnight and reassess in the morning to see how she is doing from a pain control standpoint.  If not doing well may proceed with surgery as early as tomorrow.    Willaim Sheng, MD  Contact information:   [email or web address] 7am-5pm epic message Dr. Zachery Dakins, or call office for patient follow up: [phone] After hours and holidays please check [email or web address] for group call information for [facility]

## 2022-05-09 NOTE — Plan of Care (Signed)
  Problem: Education: Goal: Knowledge of General Education information will improve Description: Including pain rating scale, medication(s)/side effects and non-pharmacologic comfort measures Outcome: Progressing

## 2022-05-09 NOTE — Progress Notes (Signed)
  Progress Note   Patient: Miranda Roman WCH:[id number] DOB: 1946/03/18 DOA: 05/08/2022     1 DOS: the patient was seen and examined on 05/09/2022   Brief hospital course: 76 y.o. female with medical history significant of mild neurocognitive disorder, HTN, pulmonary HTN, bronchiectasis, T2DM, CKD4 who presents after a fall.    Pt just moved in to live with daughter and was tripped by 2 dogs today and fell. Did not hit her head or lose consciousness. She normally ambulates with walker but was not doing so at the time. Pt found to have R tib/fib fracture of distal radial metaphysis with mild volar apex angulation and acute nondisplaced fx of distal ulnar metaphysis  Assessment and Plan: * Tibia/fibula fracture, right, closed, initial encounter -Right Tib/Fib X-ray showed acute mildly to moderately comminuted and displaced fracture of the proximal tibial metadiaphysis.  -Acute mildly displaced and mild impacted fx of the proximal fibular metaphysis.  -Ortho was consulted. Discussed with Ortho. Plan to attempt mobility, however if unable to tolerate, then plan surgery tomorrow -PRN Tylenol, hydrocodone, and low dose IV morphine for pain- daughter concerned about high dose opioids since she had severe encephalopathy in the past from either opioids or antibiotics     Left wrist pain left wrist X-ray reviewed, neg   Right radial fracture -Right wrist with acute fracture of the distal radial metaphysis with mild volar apex angulation. -Acute nondisplaced fx of the distal ulnar metaphysis.  Hand surgery Dr. Greta Doom consulted. Fractures are non-operable and place on splint. Need to follow up outpatient   Benign essential hypertension Continue home meds as tolerated   Type II diabetes mellitus Controlled. Place on sensitive SSI at mealtime.      Subjective: Complains of R leg pain on movement  Physical Exam: Vitals:   05/08/22 2300 05/09/22 0707 05/09/22 1035 05/09/22 1344  BP:  125/69  (!) 155/64 (!) 154/66  Pulse:  66 65 62  Resp:  _0 Temp:  98.9 F (37.2 C) 98.3 F (36.8 C) 97.6 F (36.4 C)  TempSrc:   Oral Oral  SpO2:  99% 100% 99%  Weight: 78.2 kg     Height: _1  (1.626 m)      General exam: Awake, laying in bed, in nad Respiratory system: Normal respiratory effort, no wheezing Cardiovascular system: regular rate, s1, s2 Gastrointestinal system: Soft, nondistended, positive BS Central nervous system: CN2-12 grossly intact, strength intact Extremities: Perfused, no clubbing Skin: Normal skin turgor, no notable skin lesions seen Psychiatry: Mood normal // no visual hallucinations   Data Reviewed:  Labs reviewed: Na 138, K 5.8, Cr 2.73  Family Communication: Pt in room, family at bedside  Disposition: Status is: Inpatient Remains inpatient appropriate because: Severity of illness  Planned Discharge Destination:  Pending PT eval    Author: Marylu Lund, MD 05/09/2022 4:17 PM  For on call review [email or web address].

## 2022-05-09 NOTE — H&P (View-Only) (Signed)
ORTHOPAEDIC CONSULTATION  REQUESTING PHYSICIAN: Donne Hazel, MD  Chief Complaint: Fall with right tibia fracture  HPI: Miranda Roman is a 76 y.o. female with a history of encephalopathy, CKD stage IV, diabetes, hypertension pulmonary hypertension who sustained a fall yesterday injuring her right leg and right wrist.  She lives with her daughter who is her caretaker.  She at baseline ambulates with a walker but was not using one yesterday when she fell.  IN the emergency room she was found to have a right tib-fib fracture and placed into a long-leg splint.  She is comfortable in bed at this point but does not tolerate any movement of her right leg.  She denies distal numbness and tingling.  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   Borderline steroid-induced glaucoma, 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   OD, Early elevated intraocular pressure to 28 mm we will discontinue all topical medications from surgery in the right eye today   Bronchiectasis without complication 75/04/2584   Cataract 07/03/2020   Chest pain at rest 01/30/2015   Chronic kidney disease, stage 4 (severe) 12/07/2020   Closed fracture of right tibial plateau 07/17/2021   Cyst of left kidney 03/10/2019   Needs repeated US 08/2019   Dysphagia, neurologic    Elevated d-dimer 12/20/2019   Facet arthritis of lumbar region 02/27/2019   Gastroesophageal reflux disease 08/26/2016   History of  gastric bypass 08/14/2009   Hyperglycemia due to type 2 diabetes mellitus 12/07/2020   Hyperlipidemia    Hypertensive nephropathy 10/15/2017   Inadequate oral nutritional intake    Leukocytosis 12/22/2019   Long term (current) use of insulin 12/07/2020   Low grade squamous intraepithelial lesion (LGSIL) on cervicovaginal cytologic smear 12/15/2018   Lumbago 02/27/2019   Macular atrophy, 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  Further history patient suffered possible Wernicke's encephalopathy the past, a type of the vitamin deficiencies, thiamine that can also affect the retina if there is significant damage which may not be recoverable.   Mild neurocognitive disorder due to multiple etiologies 01/23/2022   Osteoporosis 12/15/2018   Overweight with body mass index (BMI) 25.0-29.9 12/15/2018   PAD (peripheral artery disease) 06/06/2019   Pain in right hand 04/27/2020   Pancreatic cyst 06/06/2019   Polyneuropathy due to type 2 diabetes mellitus    Posterior vitreous detachment of left eye 08/05/2021   Proliferative diabetic retinopathy of right eye 08/22/2021   Likely contributory cause of nonclearing vitreous hemorrhage in the right eye now status post PRP likely to resolve in 2 quiescent PDR, post vitrectomy PRP   Pulmonary hypertension 02/04/2021   Right knee pain 07/10/2021   Sickle-cell trait 12/07/2020   Type II diabetes mellitus 03/10/2012   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   Vitreous hemorrhage of right eye 08/05/2021   Vitrectomy PRP 08-14-2021   Past Surgical History:  Procedure Laterality Date  BONE RESECTION  01/2006   wrist   GASTRIC BYPASS  08/14/2009   OTHER SURGICAL HISTORY     sickle cell retinopathy laser surgery   TUBAL LIGATION  04/28/1977   Social History   Socioeconomic History   Marital status: Widowed    Spouse name: Not on file   Number of children:  Not on file   Years of education: 16   Highest education level: Bachelor's degree (e.g., BA, AB, BS)  Occupational History   Occupation: Retired    Comment: banking  Tobacco Use   Smoking status: Former    Packs/day: 0.25    Years: 43.00    Total pack years: 10.75    Types: Cigarettes    Start date: 04/1962    Quit date: 04/2005    Years since quitting: 17.1   Smokeless tobacco: Never  Vaping Use   Vaping Use: Never used  Substance and Sexual Activity   Alcohol use: Not Currently    Comment: occasionally   Drug use: No   Sexual activity: Not Currently  Other Topics Concern   Not on file  Social History Narrative   Right handed   Lives with Daughter in one story   Social Determinants of Health   Financial Resource Strain: [place]  (01/29/2022)   Overall Financial Resource Strain (CARDIA)    Difficulty of Paying Living Expenses: Not hard at all  Food Insecurity: No Food Insecurity (05/08/2022)   Hunger Vital Sign    Worried About Running Out of Food in the Last Year: Never true    Ran Out of Food in the Last Year: Never true  Transportation Needs: No Transportation Needs (05/08/2022)   PRAPARE - Hydrologist (Medical): No    Lack of Transportation (Non-Medical): No  Physical Activity: Inactive (01/29/2022)   Exercise Vital Sign    Days of Exercise per Week: 0 days    Minutes of Exercise per Session: 0 min  Stress: No Stress Concern Present (01/29/2022)   [place]    Feeling of Stress : Not at all  Social Connections: Not on file   Family History  Problem Relation Age of Onset   Diabetes Mother    Heart disease Mother    Hypertension Mother    Stroke Mother    Diabetes Father    Heart disease Father    Diabetes Sister    Diabetes Brother    Hypertension Brother    Sickle cell anemia Brother    Diabetes Maternal Aunt    Diabetes Maternal Uncle    Diabetes Maternal  Grandmother    Diabetes Maternal Grandfather    Sickle cell anemia Sister    Sickle cell anemia Sister    Healthy Son    Healthy Son    Healthy Daughter    Allergies  Allergen Reactions   Cefepime Other (See Comments)    Causes encephalitis - reported by [facility] 12/31/2019   Lactose Intolerance (Gi)     Per pt's daughter causes gas     Positive ROS: All other systems have been reviewed and were otherwise negative with the exception of those mentioned in the HPI and as above.  Physical Exam: General: Alert, no acute distress Cardiovascular: No pedal edema Respiratory: No cyanosis, no use of accessory musculature Skin: No lesions in the area of chief complaint Neurologic: Sensation intact distally  MUSCULOSKELETAL:  RLE long-leg splint clean, dry, and intact  Tender about the lower leg, moderate swelling about the lower leg  Does not tolerate any manipulation of the right leg in the splint.  No knee or ankle effusion  Knee stable to varus/ valgus stress  Sens DPN, SPN, TN intact  Motor EHL, ext, flex 5/5  DP 2+, No significant edema   IMAGING: Strays right tibia and fibula reviewed demonstrate mildly displaced right tibia and fibula fracture  Assessment: Principal Problem:   Tibia/fibula fracture, right, closed, initial encounter Active Problems:   Type II diabetes mellitus   Benign essential hypertension   Right radial fracture   Right distal ulnar fracture   Left wrist pain  Right tibia and fibula fracture  Plan: Discussed with the daughter and son this afternoon regarding the patient's injury.  She has a right tibia and fibula fracture that overall is well aligned.  If she could tolerate some mobility and be comfortable in the splint alone could expect good healing with nonoperative treatment.  However, if unable to move or mobilize from the bed would benefit from surgical fixation.  Discussed open reduction internal fixation would act as an internal cast to  allow the fracture to heal.  Would have the knee and ankle free to allow for range of motion as well as only a soft dressing as opposed to a heavy splint.  Risks of surgery are elevated given her kidney disease, vascular disease, and diabetes notably risk of wound healing and infection.  At this point she has a good palpable pulse and well-perfused foot, but more concerned about microvascular disease given her comorbidities. We will make patient n.p.o. at midnight and reassess in the morning to see how she is doing from a pain control standpoint.  If not doing well may proceed with surgery as early as tomorrow.    Willaim Sheng, MD  Contact information:   [id number] 7am-5pm epic message Dr. Zachery Dakins, or call office for patient follow up: [phone] After hours and holidays please check [email or web address] for group call information for [facility]

## 2022-05-09 NOTE — Hospital Course (Signed)
76 y.o. female with medical history significant of mild neurocognitive disorder, HTN, pulmonary HTN, bronchiectasis, T2DM, CKD4 who presents after a fall.    Pt just moved in to live with daughter and was tripped by 2 dogs today and fell. Did not hit her head or lose consciousness. She normally ambulates with walker but was not doing so at the time. Pt found to have R tib/fib fracture of distal radial metaphysis with mild volar apex angulation and acute nondisplaced fx of distal ulnar metaphysis

## 2022-05-09 NOTE — Anesthesia Preprocedure Evaluation (Addendum)
Anesthesia Evaluation  Patient identified by MRN, date of birth, ID band Patient awake    Reviewed: Allergy & Precautions, NPO status , Patient's Chart, lab work & pertinent test results  History of Anesthesia Complications Negative for: history of anesthetic complications  Airway Mallampati: II  TM Distance: >3 FB Neck ROM: Full    Dental no notable dental hx. (+) Dental Advisory Given   Pulmonary former smoker   Pulmonary exam normal        Cardiovascular hypertension, Normal cardiovascular exam  Pulmonary htn   Neuro/Psych negative neurological ROS     GI/Hepatic negative GI ROS, Neg liver ROS,,,  Endo/Other  diabetes    Renal/GU Renal InsufficiencyRenal disease     Musculoskeletal negative musculoskeletal ROS (+)    Abdominal   Peds  Hematology  (+) Blood dyscrasia, anemia   Anesthesia Other Findings   Reproductive/Obstetrics                             Anesthesia Physical Anesthesia Plan  ASA: 3  Anesthesia Plan: General   Post-op Pain Management: Tylenol PO (pre-op)*   Induction: Intravenous  PONV Risk Score and Plan: 4 or greater and Ondansetron, Dexamethasone, Midazolam and Diphenhydramine  Airway Management Planned: LMA  Additional Equipment:   Intra-op Plan:   Post-operative Plan: Extubation in OR  Informed Consent: I have reviewed the patients History and Physical, chart, labs and discussed the procedure including the risks, benefits and alternatives for the proposed anesthesia with the patient or authorized representative who has indicated his/her understanding and acceptance.     Dental advisory given  Plan Discussed with: Anesthesiologist and CRNA  Anesthesia Plan Comments:        Anesthesia Quick Evaluation

## 2022-05-10 ENCOUNTER — Inpatient Hospital Stay (HOSPITAL_COMMUNITY): Payer: Medicare Other | Admitting: Anesthesiology

## 2022-05-10 ENCOUNTER — Other Ambulatory Visit: Payer: Self-pay

## 2022-05-10 ENCOUNTER — Encounter (HOSPITAL_COMMUNITY)
Admission: EM | Disposition: A | Discharge status: Rehabilitation Facility | Payer: Self-pay | Source: Home / Self Care | Attending: Internal Medicine

## 2022-05-10 ENCOUNTER — Inpatient Hospital Stay (HOSPITAL_COMMUNITY): Payer: Medicare Other

## 2022-05-10 DIAGNOSIS — S82101A Unspecified fracture of upper end of right tibia, initial encounter for closed fracture: Secondary | ICD-10-CM

## 2022-05-10 DIAGNOSIS — S52501A Unspecified fracture of the lower end of right radius, initial encounter for closed fracture: Secondary | ICD-10-CM

## 2022-05-10 DIAGNOSIS — I10 Essential (primary) hypertension: Secondary | ICD-10-CM

## 2022-05-10 DIAGNOSIS — S82401A Unspecified fracture of shaft of right fibula, initial encounter for closed fracture: Secondary | ICD-10-CM | POA: Diagnosis not present

## 2022-05-10 DIAGNOSIS — S52601A Unspecified fracture of lower end of right ulna, initial encounter for closed fracture: Secondary | ICD-10-CM

## 2022-05-10 DIAGNOSIS — W010XXA Fall on same level from slipping, tripping and stumbling without subsequent striking against object, initial encounter: Secondary | ICD-10-CM | POA: Diagnosis not present

## 2022-05-10 DIAGNOSIS — S82201A Unspecified fracture of shaft of right tibia, initial encounter for closed fracture: Secondary | ICD-10-CM | POA: Diagnosis not present

## 2022-05-10 DIAGNOSIS — Z87891 Personal history of nicotine dependence: Secondary | ICD-10-CM

## 2022-05-10 DIAGNOSIS — R030 Elevated blood-pressure reading, without diagnosis of hypertension: Secondary | ICD-10-CM | POA: Diagnosis not present

## 2022-05-10 HISTORY — PX: OPEN REDUCTION INTERNAL FIXATION (ORIF) TIBIA/FIBULA FRACTURE: SHX5992

## 2022-05-10 LAB — GLUCOSE, CAPILLARY
Glucose-Capillary: 178 mg/dL — ABNORMAL HIGH (ref 70–99)
Glucose-Capillary: 179 mg/dL — ABNORMAL HIGH (ref 70–99)
Glucose-Capillary: 302 mg/dL — ABNORMAL HIGH (ref 70–99)
Glucose-Capillary: 435 mg/dL — ABNORMAL HIGH (ref 70–99)
Glucose-Capillary: 447 mg/dL — ABNORMAL HIGH (ref 70–99)

## 2022-05-10 LAB — SURGICAL PCR SCREEN
MRSA, PCR: NEGATIVE
Staphylococcus aureus: NEGATIVE

## 2022-05-10 LAB — CBC
HCT: 28.5 % — ABNORMAL LOW (ref 36.0–46.0)
Hemoglobin: 9.2 g/dL — ABNORMAL LOW (ref 12.0–15.0)
MCH: 23.8 pg — ABNORMAL LOW (ref 26.0–34.0)
MCHC: 32.3 g/dL (ref 30.0–36.0)
MCV: 73.8 fL — ABNORMAL LOW (ref 80.0–100.0)
Platelets: 128 10*3/uL — ABNORMAL LOW (ref 150–400)
RBC: 3.86 MIL/uL — ABNORMAL LOW (ref 3.87–5.11)
RDW: 15.7 % — ABNORMAL HIGH (ref 11.5–15.5)
WBC: 8.9 10*3/uL (ref 4.0–10.5)
nRBC: 0 % (ref 0.0–0.2)

## 2022-05-10 LAB — COMPREHENSIVE METABOLIC PANEL
ALT: 33 U/L (ref 0–44)
AST: 27 U/L (ref 15–41)
Albumin: 3.2 g/dL — ABNORMAL LOW (ref 3.5–5.0)
Alkaline Phosphatase: 65 U/L (ref 38–126)
Anion gap: 8 (ref 5–15)
BUN: 44 mg/dL — ABNORMAL HIGH (ref 8–23)
CO2: 22 mmol/L (ref 22–32)
Calcium: 8.4 mg/dL — ABNORMAL LOW (ref 8.9–10.3)
Chloride: 110 mmol/L (ref 98–111)
Creatinine, Ser: 3.07 mg/dL — ABNORMAL HIGH (ref 0.44–1.00)
GFR, Estimated: 15 mL/min — ABNORMAL LOW (ref >60)
Glucose, Bld: 191 mg/dL — ABNORMAL HIGH (ref 70–99)
Potassium: 4.6 mmol/L (ref 3.5–5.1)
Sodium: 140 mmol/L (ref 135–145)
Total Bilirubin: 0.7 mg/dL (ref 0.3–1.2)
Total Protein: 6 g/dL — ABNORMAL LOW (ref 6.5–8.1)

## 2022-05-10 LAB — GLUCOSE, RANDOM: Glucose, Bld: 483 mg/dL — ABNORMAL HIGH (ref 70–99)

## 2022-05-10 SURGERY — OPEN REDUCTION INTERNAL FIXATION (ORIF) TIBIA/FIBULA FRACTURE
Anesthesia: General | Site: Leg Lower | Laterality: Right

## 2022-05-10 MED ORDER — VANCOMYCIN HCL IN DEXTROSE 1-5 GM/200ML-% IV SOLN
INTRAVENOUS | Status: AC
  Filled 2022-05-10: qty 200

## 2022-05-10 MED ORDER — PHENYLEPHRINE 80 MCG/ML (10ML) SYRINGE FOR IV PUSH (FOR BLOOD PRESSURE SUPPORT)
PREFILLED_SYRINGE | INTRAVENOUS | Status: DC | PRN
  Administered 2022-05-10 (×4): 80 ug via INTRAVENOUS

## 2022-05-10 MED ORDER — ACETAMINOPHEN 500 MG PO TABS
1000.0000 mg | ORAL_TABLET | Freq: Once | ORAL | Status: AC
  Administered 2022-05-10: 1000 mg via ORAL

## 2022-05-10 MED ORDER — ONDANSETRON HCL 4 MG/2ML IJ SOLN
INTRAMUSCULAR | Status: DC | PRN
  Administered 2022-05-10: 4 mg via INTRAVENOUS

## 2022-05-10 MED ORDER — ENOXAPARIN SODIUM 30 MG/0.3ML IJ SOSY
30.0000 mg | PREFILLED_SYRINGE | INTRAMUSCULAR | Status: DC
  Administered 2022-05-11 – 2022-05-19 (×9): 30 mg via SUBCUTANEOUS
  Filled 2022-05-10 (×9): qty 0.3

## 2022-05-10 MED ORDER — METHOCARBAMOL 1000 MG/10ML IJ SOLN: 500.0000 mg | Freq: Four times a day (QID) | INTRAVENOUS | Status: DC | PRN

## 2022-05-10 MED ORDER — VANCOMYCIN HCL IN DEXTROSE 1-5 GM/200ML-% IV SOLN
1000.0000 mg | Freq: Once | INTRAVENOUS | Status: AC
  Administered 2022-05-10: 1000 mg via INTRAVENOUS

## 2022-05-10 MED ORDER — ACETAMINOPHEN 325 MG PO TABS
325.0000 mg | ORAL_TABLET | Freq: Four times a day (QID) | ORAL | Status: DC | PRN
  Administered 2022-05-12 – 2022-05-18 (×14): 650 mg via ORAL
  Administered 2022-05-19: 325 mg via ORAL
  Filled 2022-05-10 (×16): qty 2

## 2022-05-10 MED ORDER — 0.9 % SODIUM CHLORIDE (POUR BTL) OPTIME
TOPICAL | Status: DC | PRN
  Administered 2022-05-10: 1000 mL

## 2022-05-10 MED ORDER — FENTANYL CITRATE (PF) 100 MCG/2ML IJ SOLN
INTRAMUSCULAR | Status: AC
  Filled 2022-05-10: qty 2

## 2022-05-10 MED ORDER — MAGNESIUM CITRATE PO SOLN: 1.0000 | Freq: Once | ORAL | Status: DC | PRN

## 2022-05-10 MED ORDER — METHOCARBAMOL 500 MG PO TABS
500.0000 mg | ORAL_TABLET | Freq: Four times a day (QID) | ORAL | Status: DC | PRN
  Administered 2022-05-11 – 2022-05-18 (×9): 500 mg via ORAL
  Filled 2022-05-10 (×11): qty 1

## 2022-05-10 MED ORDER — GLYCOPYRROLATE 0.2 MG/ML IJ SOLN
INTRAMUSCULAR | Status: DC | PRN
  Administered 2022-05-10: .1 mg via INTRAVENOUS

## 2022-05-10 MED ORDER — LACTATED RINGERS IV SOLN: INTRAVENOUS | Status: DC

## 2022-05-10 MED ORDER — PROPOFOL 500 MG/50ML IV EMUL
INTRAVENOUS | Status: DC | PRN
  Administered 2022-05-10: 25 ug/kg/min via INTRAVENOUS
  Administered 2022-05-10: 30 mg via INTRAVENOUS

## 2022-05-10 MED ORDER — SENNA 8.6 MG PO TABS
1.0000 | ORAL_TABLET | Freq: Two times a day (BID) | ORAL | Status: DC
  Administered 2022-05-10 – 2022-05-18 (×16): 8.6 mg via ORAL
  Filled 2022-05-10 (×17): qty 1

## 2022-05-10 MED ORDER — METOCLOPRAMIDE HCL 5 MG PO TABS: 5.0000 mg | ORAL_TABLET | Freq: Three times a day (TID) | ORAL | Status: DC | PRN

## 2022-05-10 MED ORDER — ACETAMINOPHEN 500 MG PO TABS
500.0000 mg | ORAL_TABLET | Freq: Four times a day (QID) | ORAL | Status: AC
  Administered 2022-05-10 – 2022-05-11 (×3): 500 mg via ORAL
  Filled 2022-05-10 (×3): qty 1

## 2022-05-10 MED ORDER — METOCLOPRAMIDE HCL 5 MG/ML IJ SOLN
5.0000 mg | Freq: Three times a day (TID) | INTRAMUSCULAR | Status: DC | PRN
  Administered 2022-05-16: 10 mg via INTRAVENOUS
  Filled 2022-05-10 (×2): qty 2

## 2022-05-10 MED ORDER — MIDAZOLAM HCL 2 MG/2ML IJ SOLN
INTRAMUSCULAR | Status: AC
  Filled 2022-05-10: qty 2

## 2022-05-10 MED ORDER — LIDOCAINE 2% (20 MG/ML) 5 ML SYRINGE
INTRAMUSCULAR | Status: DC | PRN
  Administered 2022-05-10: 100 mg via INTRAVENOUS

## 2022-05-10 MED ORDER — CEFAZOLIN SODIUM-DEXTROSE 2-4 GM/100ML-% IV SOLN
2.0000 g | Freq: Four times a day (QID) | INTRAVENOUS | Status: AC
  Administered 2022-05-10 – 2022-05-11 (×3): 2 g via INTRAVENOUS
  Filled 2022-05-10 (×3): qty 100

## 2022-05-10 MED ORDER — ONDANSETRON HCL 4 MG/2ML IJ SOLN
INTRAMUSCULAR | Status: AC
  Filled 2022-05-10: qty 2

## 2022-05-10 MED ORDER — BISACODYL 10 MG RE SUPP: 10.0000 mg | Freq: Every day | RECTAL | Status: DC | PRN

## 2022-05-10 MED ORDER — TRAMADOL HCL 50 MG PO TABS
50.0000 mg | ORAL_TABLET | Freq: Two times a day (BID) | ORAL | Status: DC | PRN
  Administered 2022-05-11 – 2022-05-19 (×11): 50 mg via ORAL
  Filled 2022-05-10 (×12): qty 1

## 2022-05-10 MED ORDER — DEXAMETHASONE SODIUM PHOSPHATE 10 MG/ML IJ SOLN
INTRAMUSCULAR | Status: DC | PRN
  Administered 2022-05-10: 10 mg via INTRAVENOUS

## 2022-05-10 MED ORDER — SODIUM CHLORIDE 0.9 % IV SOLN: INTRAVENOUS | Status: DC | PRN

## 2022-05-10 MED ORDER — INSULIN ASPART 100 UNIT/ML IJ SOLN
6.0000 [IU] | Freq: Once | INTRAMUSCULAR | Status: AC
  Administered 2022-05-10: 6 [IU] via SUBCUTANEOUS

## 2022-05-10 MED ORDER — MORPHINE SULFATE (PF) 2 MG/ML IV SOLN
0.5000 mg | INTRAVENOUS | Status: DC | PRN
  Administered 2022-05-11 (×2): 0.5 mg via INTRAVENOUS
  Administered 2022-05-15 – 2022-05-16 (×2): 1 mg via INTRAVENOUS
  Filled 2022-05-10 (×4): qty 1

## 2022-05-10 MED ORDER — POLYETHYLENE GLYCOL 3350 17 G PO PACK: 17.0000 g | PACK | Freq: Every day | ORAL | Status: DC | PRN

## 2022-05-10 MED ORDER — DEXMEDETOMIDINE HCL IN NACL 80 MCG/20ML IV SOLN
INTRAVENOUS | Status: DC | PRN
  Administered 2022-05-10: 8 ug via BUCCAL

## 2022-05-10 MED ORDER — ACETAMINOPHEN 500 MG PO TABS
ORAL_TABLET | ORAL | Status: AC
  Filled 2022-05-10: qty 2

## 2022-05-10 MED ORDER — TRANEXAMIC ACID-NACL 1000-0.7 MG/100ML-% IV SOLN
1000.0000 mg | Freq: Once | INTRAVENOUS | Status: AC
  Administered 2022-05-10: 1000 mg via INTRAVENOUS
  Filled 2022-05-10: qty 100

## 2022-05-10 MED ORDER — CEFAZOLIN SODIUM 1 G IJ SOLR
INTRAMUSCULAR | Status: AC
  Filled 2022-05-10: qty 20

## 2022-05-10 MED ORDER — ROPIVACAINE HCL 5 MG/ML IJ SOLN
INTRAMUSCULAR | Status: DC | PRN
  Administered 2022-05-10: 20 mL via PERINEURAL

## 2022-05-10 MED ORDER — DOCUSATE SODIUM 100 MG PO CAPS
100.0000 mg | ORAL_CAPSULE | Freq: Two times a day (BID) | ORAL | Status: DC
  Administered 2022-05-10 – 2022-05-18 (×16): 100 mg via ORAL
  Filled 2022-05-10 (×17): qty 1

## 2022-05-10 MED ORDER — PHENYLEPHRINE HCL (PRESSORS) 10 MG/ML IV SOLN
INTRAVENOUS | Status: AC
  Filled 2022-05-10: qty 1

## 2022-05-10 MED ORDER — DEXAMETHASONE SODIUM PHOSPHATE 10 MG/ML IJ SOLN
INTRAMUSCULAR | Status: AC
  Filled 2022-05-10: qty 1

## 2022-05-10 MED ORDER — STERILE WATER FOR IRRIGATION IR SOLN
Status: DC | PRN
  Administered 2022-05-10: 1000 mL

## 2022-05-10 MED ORDER — ONDANSETRON HCL 4 MG/2ML IJ SOLN: 4.0000 mg | Freq: Four times a day (QID) | INTRAMUSCULAR | Status: DC | PRN

## 2022-05-10 MED ORDER — PHENYLEPHRINE HCL-NACL 20-0.9 MG/250ML-% IV SOLN
INTRAVENOUS | Status: DC | PRN
  Administered 2022-05-10: 25 ug/min via INTRAVENOUS

## 2022-05-10 MED ORDER — PROPOFOL 10 MG/ML IV BOLUS
INTRAVENOUS | Status: AC
  Filled 2022-05-10: qty 20

## 2022-05-10 MED ORDER — ONDANSETRON HCL 4 MG PO TABS: 4.0000 mg | ORAL_TABLET | Freq: Four times a day (QID) | ORAL | Status: DC | PRN

## 2022-05-10 MED ORDER — BUPIVACAINE HCL (PF) 0.5 % IJ SOLN
INTRAMUSCULAR | Status: AC
  Filled 2022-05-10: qty 30

## 2022-05-10 MED ORDER — DIPHENHYDRAMINE HCL 12.5 MG/5ML PO ELIX
12.5000 mg | ORAL_SOLUTION | ORAL | Status: DC | PRN
  Administered 2022-05-10: 25 mg via ORAL
  Filled 2022-05-10: qty 10

## 2022-05-10 SURGICAL SUPPLY — 94 items
BANDAGE ESMARK 6X9 LF (GAUZE/BANDAGES/DRESSINGS) ×1 IMPLANT
BIT DRILL 2.5X2.75 QC CALB (BIT) IMPLANT
BIT DRILL 4.3 CALIBRATED (DRILL) IMPLANT
BIT DRILL CALIBRTD FREE HND4.3 (BIT) IMPLANT
BLADE SURG 15 STRL LF DISP TIS (BLADE) ×2 IMPLANT
BLADE SURG 15 STRL SS (BLADE) ×2
BLADE SURG SZ10 CARB STEEL (BLADE) ×1 IMPLANT
BNDG CMPR 5X4 CHSV STRCH STRL (GAUZE/BANDAGES/DRESSINGS) ×1
BNDG CMPR 9X6 STRL LF SNTH (GAUZE/BANDAGES/DRESSINGS) ×1
BNDG CMPR MED 10X6 ELC LF (GAUZE/BANDAGES/DRESSINGS) ×1
BNDG COHESIVE 4X5 TAN STRL LF (GAUZE/BANDAGES/DRESSINGS) ×1 IMPLANT
BNDG ELASTIC 4X5.8 VLCR STR LF (GAUZE/BANDAGES/DRESSINGS) ×1 IMPLANT
BNDG ELASTIC 6X10 VLCR STRL LF (GAUZE/BANDAGES/DRESSINGS) IMPLANT
BNDG ELASTIC 6X5.8 VLCR STR LF (GAUZE/BANDAGES/DRESSINGS) ×1 IMPLANT
BNDG ESMARK 6X9 LF (GAUZE/BANDAGES/DRESSINGS) ×1
BNDG PLASTER X FAST 5X5 WHT LF (CAST SUPPLIES) ×1 IMPLANT
BNDG PLSTR 5X5 XFST ST WHT LF (CAST SUPPLIES) ×1
CLSR STERI-STRIP ANTIMIC 1/2X4 (GAUZE/BANDAGES/DRESSINGS) ×1 IMPLANT
COVER BACK TABLE 60X90IN (DRAPES) ×1 IMPLANT
CUFF TOURN SGL QUICK 34 (TOURNIQUET CUFF)
CUFF TRNQT CYL 34X4.125X (TOURNIQUET CUFF) IMPLANT
DRAPE C-ARM 42X120 X-RAY (DRAPES) IMPLANT
DRAPE C-ARMOR (DRAPES) IMPLANT
DRAPE EXTREMITY T 121X128X90 (DISPOSABLE) ×1 IMPLANT
DRAPE IMP U-DRAPE 54X76 (DRAPES) ×1 IMPLANT
DRAPE INCISE IOBAN 66X45 STRL (DRAPES) ×1 IMPLANT
DRAPE OEC MINIVIEW 54X84 (DRAPES) IMPLANT
DRAPE SHEET LG 3/4 BI-LAMINATE (DRAPES) IMPLANT
DRAPE U-SHAPE 47X51 STRL (DRAPES) ×1 IMPLANT
DRILL 4.3 CALIBRATED (DRILL) ×1
DRILL CALIBRATED FREE HAND 4.3 (BIT) ×1
DURAPREP 26ML APPLICATOR (WOUND CARE) ×1 IMPLANT
ELECT REM PT RETURN 15FT ADLT (MISCELLANEOUS) ×1 IMPLANT
GAUZE PAD ABD 8X10 STRL (GAUZE/BANDAGES/DRESSINGS) ×1 IMPLANT
GAUZE SPONGE 4X4 12PLY STRL (GAUZE/BANDAGES/DRESSINGS) ×1 IMPLANT
GAUZE XEROFORM 5X9 LF (GAUZE/BANDAGES/DRESSINGS) IMPLANT
GLOVE BIO SURGEON STRL SZ7 (GLOVE) ×1 IMPLANT
GLOVE BIOGEL PI IND STRL 7.0 (GLOVE) ×1 IMPLANT
GLOVE BIOGEL PI IND STRL 8 (GLOVE) ×2 IMPLANT
GLOVE ORTHO TXT STRL SZ7.5 (GLOVE) ×1 IMPLANT
GOWN STRL REUS W/ TWL LRG LVL3 (GOWN DISPOSABLE) ×1 IMPLANT
GOWN STRL REUS W/ TWL XL LVL3 (GOWN DISPOSABLE) ×2 IMPLANT
GOWN STRL REUS W/TWL LRG LVL3 (GOWN DISPOSABLE) ×1
GOWN STRL REUS W/TWL XL LVL3 (GOWN DISPOSABLE) ×2
GUIDEPIN 3.0 THREADED 305MM (PIN) IMPLANT
GUIDEWIRE 3.0X100MM BALL TIP (WIRE) IMPLANT
KIT BASIN OR (CUSTOM PROCEDURE TRAY) ×1 IMPLANT
KIT TURNOVER KIT A (KITS) ×1 IMPLANT
NAIL TIBIAL ZNN 10MMX32CM (Nail) IMPLANT
NDL HYPO 25X1 1.5 SAFETY (NEEDLE) IMPLANT
NEEDLE HYPO 25X1 1.5 SAFETY (NEEDLE) IMPLANT
NS IRRIG 1000ML POUR BTL (IV SOLUTION) ×1 IMPLANT
PAD CAST 4YDX4 CTTN HI CHSV (CAST SUPPLIES) ×2 IMPLANT
PADDING CAST ABS COTTON 6X4 NS (CAST SUPPLIES) IMPLANT
PADDING CAST COTTON 4X4 STRL (CAST SUPPLIES) ×2
PADDING CAST COTTON 6X4 STRL (CAST SUPPLIES) IMPLANT
PENCIL SMOKE EVACUATOR (MISCELLANEOUS) ×1 IMPLANT
PLATE TUB 100DEG 5 HO (Plate) IMPLANT
REAMER HEAD TAPER 12.0 (ORTHOPEDIC DISPOSABLE SUPPLIES) IMPLANT
SCREW 5.0 FT 45MM (Screw) IMPLANT
SCREW BONE 5.0X35MM CORTICAL (Screw) IMPLANT
SCREW CORT FA STD 5X60 (Screw) IMPLANT
SCREW CORTICAL 3.5MM  10MM (Screw) ×4 IMPLANT
SCREW CORTICAL 3.5MM 10MM (Screw) IMPLANT
SCREW HEX HEAD 3.5X32.5 (Screw) ×1 IMPLANT
SCREW Z NAIL 5.0X32.5 CORT (Screw) IMPLANT
SHEET MEDIUM DRAPE 40X70 STRL (DRAPES) ×1 IMPLANT
SLEEVE SCD COMPRESS KNEE MED (STOCKING) ×1 IMPLANT
SPIKE FLUID TRANSFER (MISCELLANEOUS) IMPLANT
SPLINT PLASTER CAST XFAST 5X30 (CAST SUPPLIES) IMPLANT
STAPLER VISISTAT 35W (STAPLE) IMPLANT
STOCKINETTE 6  STRL (DRAPES) ×1
STOCKINETTE 6 STRL (DRAPES) ×1 IMPLANT
SUCTION FRAZIER HANDLE 10FR (MISCELLANEOUS)
SUCTION TUBE FRAZIER 10FR DISP (MISCELLANEOUS) IMPLANT
SUT ETHILON 3 0 PS 1 (SUTURE) IMPLANT
SUT ETHILON 4 0 PS 2 18 (SUTURE) IMPLANT
SUT MNCRL AB 4-0 PS2 18 (SUTURE) IMPLANT
SUT VIC AB 0 CT1 27 (SUTURE) ×1
SUT VIC AB 0 CT1 27XBRD ANBCTR (SUTURE) ×1 IMPLANT
SUT VIC AB 2-0 CT1 27 (SUTURE) ×1
SUT VIC AB 2-0 CT1 TAPERPNT 27 (SUTURE) IMPLANT
SUT VIC AB 2-0 SH 18 (SUTURE) IMPLANT
SUT VIC AB 3-0 SH 27 (SUTURE)
SUT VIC AB 3-0 SH 27X BRD (SUTURE) IMPLANT
SUT VICRYL 3-0 CR8 SH (SUTURE) ×1 IMPLANT
SUT VICRYL 4-0 PS2 18IN ABS (SUTURE) ×2 IMPLANT
SYR BULB EAR ULCER 3OZ GRN STR (SYRINGE) ×1 IMPLANT
SYR CONTROL 10ML LL (SYRINGE) IMPLANT
TAPE STRIPS DRAPE STRL (GAUZE/BANDAGES/DRESSINGS) IMPLANT
TOWEL GREEN STERILE FF (TOWEL DISPOSABLE) ×2 IMPLANT
TUBE CONNECTING 20X1/4 (TUBING) ×1 IMPLANT
UNDERPAD 30X36 HEAVY ABSORB (UNDERPADS AND DIAPERS) ×1 IMPLANT
YANKAUER SUCT BULB TIP NO VENT (SUCTIONS) ×1 IMPLANT

## 2022-05-10 NOTE — Progress Notes (Signed)
PT Cancellation Note  Patient Details Name: Miranda Roman MRN: [id number] DOB: 15-Oct-1945   Cancelled Treatment:    Reason Eval/Treat Not Completed: Patient not medically ready--Scheduled for surgery this a.m. Will hold PT today and check back on tomorrow.    [facility]  Office: [phone]

## 2022-05-10 NOTE — Anesthesia Procedure Notes (Addendum)
Spinal  Patient location during procedure: OR Start time: 05/10/2022 9:52 AM End time: 05/10/2022 10:02 AM Reason for block: surgical anesthesia Staffing Performed: anesthesiologist  Anesthesiologist: Duane Boston, MD Performed by: Duane Boston, MD Authorized by: Duane Boston, MD   Preanesthetic Checklist Completed: patient identified, IV checked, risks and benefits discussed, surgical consent, monitors and equipment checked, pre-op evaluation and timeout performed Spinal Block Patient position: right lateral decubitus Prep: DuraPrep Patient monitoring: cardiac monitor, continuous pulse ox and blood pressure Approach: midline Location: L2-3 Injection technique: single-shot Needle Needle type: Pencan  Needle gauge: 24 G Needle length: 9 cm Assessment Events: CSF return Additional Notes Functioning IV was confirmed and monitors were applied. Sterile prep and drape, including hand hygiene and sterile gloves were used. The patient was positioned and the spine was prepped. The skin was anesthetized with lidocaine.  Free flow of clear CSF was obtained prior to injecting local anesthetic into the CSF.  The spinal needle aspirated freely following injection.  The needle was carefully withdrawn.  The patient tolerated the procedure well.

## 2022-05-10 NOTE — Transfer of Care (Signed)
Immediate Anesthesia Transfer of Care Note  Patient: Miranda Roman  Procedure(s) Performed: Procedure(s): OPEN REDUCTION INTERNAL FIXATION (ORIF) TIBIA/FIBULA FRACTURE (Right)  Patient Location: PACU  Anesthesia Type:Spinal  Level of Consciousness: awake, alert  and oriented  Airway & Oxygen Therapy: Patient Spontanous Breathing  Post-op Assessment: Report given to RN and Post -op Vital signs reviewed and stable  Post vital signs: Reviewed and stable  Last Vitals:  Vitals:   05/10/22 0417 05/10/22 0832  BP: (!) 146/68 (!) 142/68  Pulse: 83 84  Resp: 16 18  Temp: 37.2 C 36.4 C  SpO2: 01% 56%    Complications: No apparent anesthesia complications

## 2022-05-10 NOTE — Anesthesia Postprocedure Evaluation (Signed)
Anesthesia Post Note  Patient: Miranda Roman  Procedure(s) Performed: OPEN REDUCTION INTERNAL FIXATION (ORIF) TIBIA/FIBULA FRACTURE (Right: Leg Lower)     Anesthesia Type: General Anesthetic complications: no   No notable events documented.  Last Vitals:  Vitals:   05/10/22 1245 05/10/22 1300  BP: 136/64 129/68  Pulse: 72 75  Resp: (!) 23 13  Temp: (!) 36.3 C   SpO2: 99% 99%    Last Pain:  Vitals:   05/10/22 1300  TempSrc:   PainSc: 0-No pain                 Aedyn Kempfer DANIEL

## 2022-05-10 NOTE — Interval H&P Note (Signed)
History and Physical Interval Note:  05/10/2022 9:46 AM  Sandy Salaam  has presented today for surgery, with the diagnosis of Right tibia/fibula fracture.  The various methods of treatment have been discussed with the patient and family. After consideration of risks, benefits and other options for treatment, the patient has consented to  Procedure(s): OPEN REDUCTION INTERNAL FIXATION (ORIF) TIBIA/FIBULA FRACTURE (Right) as a surgical intervention.  The patient's history has been reviewed, patient examined, no change in status, stable for surgery.  I have reviewed the patient's chart and labs.  Questions were answered to the patient's satisfaction.  Also talked with family.   The risks benefits and alternatives were discussed with the patient including but not limited to the risks of nonoperative treatment, versus surgical intervention including infection, bleeding, nerve injury, malunion, nonunion, the need for revision surgery, hardware prominence, hardware failure, the need for hardware removal, blood clots, cardiopulmonary complications, morbidity, mortality, among others, and they were willing to proceed.      Johnny Bridge

## 2022-05-10 NOTE — Anesthesia Postprocedure Evaluation (Signed)
Anesthesia Post Note  Patient: Miranda Roman  Procedure(s) Performed: OPEN REDUCTION INTERNAL FIXATION (ORIF) TIBIA/FIBULA FRACTURE (Right: Leg Lower)     Patient location during evaluation: PACU Anesthesia Type: General Level of consciousness: awake and alert Pain management: pain level controlled Vital Signs Assessment: post-procedure vital signs reviewed and stable Respiratory status: spontaneous breathing and respiratory function stable Cardiovascular status: blood pressure returned to baseline and stable Postop Assessment: spinal receding Anesthetic complications: no   No notable events documented.  Last Vitals:  Vitals:   05/10/22 1245 05/10/22 1300  BP: 136/64 129/68  Pulse: 72 75  Resp: (!) 23 13  Temp: (!) 36.3 C   SpO2: 99% 99%    Last Pain:  Vitals:   05/10/22 1300  TempSrc:   PainSc: 0-No pain                 Adriano Bischof DANIEL

## 2022-05-10 NOTE — Anesthesia Procedure Notes (Addendum)
Anesthesia Regional Block: Femoral nerve block   Pre-Anesthetic Checklist: , timeout performed,  Correct Patient, Correct Site, Correct Laterality,  Correct Procedure, Correct Position, site marked,  Risks and benefits discussed,  Surgical consent,  Pre-op evaluation,  At surgeon's request and post-op pain management  Laterality: Right  Prep: chloraprep       Needles:  Injection technique: Single-shot  Needle Type: Echogenic Stimulator Needle     Needle Length: 5cm  Needle Gauge: 22     Additional Needles:   Procedures:, nerve stimulator,,, ultrasound used (permanent image in chart),,     Nerve Stimulator or Paresthesia:  Response: quadraceps contraction, 0.45 mA  Additional Responses:   Narrative:  Start time: 05/10/2022 9:41 AM End time: 05/10/2022 7:51 PM Injection made incrementally with aspirations every 5 mL.  Performed by: Personally  Anesthesiologist: Duane Boston, MD  Additional Notes: Functioning IV was confirmed and monitors were applied.  A 42m 22ga Arrow echogenic stimulator needle was used. Sterile prep and drape,hand hygiene and sterile gloves were used. Ultrasound guidance: relevant anatomy identified, needle position confirmed, local anesthetic spread visualized around nerve(s)., vascular puncture avoided.  Image printed for medical record. Negative aspiration and negative test dose prior to incremental administration of local anesthetic. The patient tolerated the procedure well.

## 2022-05-10 NOTE — Progress Notes (Signed)
  Progress Note   Patient: Miranda Roman VZC:[id number] DOB: [birth year] DOA: 05/08/2022     2 DOS: the patient was seen and examined on 05/10/2022   Brief hospital course: 76 y.o. female with medical history significant of mild neurocognitive disorder, HTN, pulmonary HTN, bronchiectasis, T2DM, CKD4 who presents after a fall.    Pt just moved in to live with daughter and was tripped by 2 dogs today and fell. Did not hit her head or lose consciousness. She normally ambulates with walker but was not doing so at the time. Pt found to have R tib/fib fracture of distal radial metaphysis with mild volar apex angulation and acute nondisplaced fx of distal ulnar metaphysis  Assessment and Plan: * Tibia/fibula fracture, right, closed, initial encounter -Right Tib/Fib X-ray showed acute mildly to moderately comminuted and displaced fracture of the proximal tibial metadiaphysis.  -Acute mildly displaced and mild impacted fx of the proximal fibular metaphysis.  -Ortho was consulted. Pt now s/p surgery 05/10/22 -Cont with analgesia as needed -Will f/u with therapy recs     Left wrist pain left wrist X-ray reviewed, neg   Right radial fracture -Right wrist with acute fracture of the distal radial metaphysis with mild volar apex angulation. -Acute nondisplaced fx of the distal ulnar metaphysis.  Hand surgery Dr. Greta Doom consulted. Fractures are non-operable and place on splint. Need to follow up outpatient   Benign essential hypertension Continue home meds as tolerated   Type II diabetes mellitus Controlled. Cont on sensitive SSI at mealtime.      Subjective: Pt seen in PACU post-op. Without complaints. Reports no significant pain at this time  Physical Exam: Vitals:   05/10/22 1230 05/10/22 1245 05/10/22 1300 05/10/22 1332  BP: 125/68 136/64 129/68 137/67  Pulse: 77 72 75 78  Resp: (!) 22 (!) _0 Temp:  (!) 97.4 F (36.3 C)  (!) 96.2 F (35.7 C)  TempSrc:      SpO2: 100% 99% 99%  95%  Weight:      Height:       General exam: Awake, laying in bed, in nad Respiratory system: Normal respiratory effort, no wheezing Cardiovascular system: regular rate, s1, s2 Gastrointestinal system: Soft, nondistended, positive BS Central nervous system: CN2-12 grossly intact, strength intact Extremities: Perfused, no clubbing Skin: Normal skin turgor, no notable skin lesions seen Psychiatry: Mood normal // no visual hallucinations   Data Reviewed:  Labs reviewed: Na 140, K 4.6, Cr 3.07  Family Communication: Pt in room, family not at bedside  Disposition: Status is: Inpatient Remains inpatient appropriate because: Severity of illness  Planned Discharge Destination:  Pending PT eval    Author: Marylu Lund, MD 05/10/2022 2:04 PM  For on call review [email or web address].

## 2022-05-10 NOTE — Op Note (Addendum)
05/08/2022 - 05/10/2022  11:52 AM  PATIENT:  Miranda Roman    PRE-OPERATIVE DIAGNOSIS: Right proximal tibia fracture, right distal radius and ulna fracture  POST-OPERATIVE DIAGNOSIS:  Same  PROCEDURE: Right proximal tibia fracture open plating with intramedullary nail fixation, closed management right distal radius and ulna fracture  SURGEON:  Johnny Bridge, MD  PHYSICIAN ASSISTANT: [name], PA-C, present and scrubbed throughout the case, critical for completion in a timely fashion, and for retraction, instrumentation, and closure.  ANESTHESIA:   General  PREOPERATIVE INDICATIONS:  Miranda Roman is a  76 y.o. female with a diagnosis of Right tibia/fibula fracture who failed conservative measures and elected for surgical management.  Initially nonsurgical management was attempted but she was unable to mobilize secondary to pain as well as coexisting right upper extremity injury with a distal radius and ulna fracture.  The risks benefits and alternatives were discussed with the patient and family preoperatively including but not limited to the risks of infection, bleeding, nerve injury, cardiopulmonary complications, the need for revision surgery, among others, and the patient was willing to proceed.  ESTIMATED BLOOD LOSS: 100 mL  OPERATIVE IMPLANTS: Biomet tibial nail size 10 x 32 with a total of 3 proximal interlocking bolts, and 1 distal interlocking bolts.  OPERATIVE FINDINGS: Significant osteoporosis with fairly poor quality bone, with an unstable tibia and proximal fibula fracture.  Implant Name Type Inv. Item Serial No. Manufacturer Lot No. LRB No. Used Action  NAIL TIBIAL ZNN 10MMX32CM - [id number] Nail NAIL TIBIAL ZNN 10MMX32CM  [employer](ORTH,TRAU,BIO,SG) [id number] Right 1 Implanted  SCREW 5.0 FT 45MM - [id number] Screw SCREW 5.0 FT 45MM  [employer](ORTH,TRAU,BIO,SG) [id number] Right 1 Implanted  SCREW CORT FA STD 5X60 - [id number] Screw SCREW CORT FA STD 5X60   [employer](ORTH,TRAU,BIO,SG) [id number] Right 1 Implanted  SCREW BONE 5.0X35MM CORTICAL - [id number] Screw SCREW BONE 5.0X35MM CORTICAL  [employer](ORTH,TRAU,BIO,SG) [id number] Right 1 Implanted  SCREW HEX HEAD 3.5X32.5 - [id number] Screw SCREW HEX HEAD 3.5X32.5  [employer](ORTH,TRAU,BIO,SG) [id number] Right 1 Implanted  PLATE TUB 038UEK 5 HO - [id number] Plate PLATE TUB [id number] 5 HO  [employer](ORTH,TRAU,BIO,SG)  Right 1 Implanted  SCREW CORTICAL 3.5MM  10MM - [id number] Screw SCREW CORTICAL 3.5MM  10MM  [employer](ORTH,TRAU,BIO,SG)  Right 4 Implanted     OPERATIVE PROCEDURE: The patient was brought to the operating room and placed in the supine position. Gen. anesthesia was administered. The lower extremity was prepped and draped in usual sterile fashion. Time out was performed.  We performed a prescrub.  The leg was elevated and exsanguinated and a tourniquet was inflated.  Anterior patellar tendon incision was performed and the proximal tibia exposed.  The fracture was just distal to the tibial tubercle, and I reduced the fracture anatomically clinically, held it with a clamp, and then applied a 5 hole plate with unicortical screws in order to provide provisional initial fixation to allow for flexing the knee up to a hyperflexed position.  The knee was hyperflexed, and a guidewire introduced into the appropriate position and confirmed on fluoroscopy on both AP and lateral views.  I opened the proximal tibia with the appropriate reamer, and then placed a ball-tipped guidewire down across the fracture site.  I confirmed position on AP and lateral views across the entire length of the tibia, and then reamed sequentially to 1.5 mm above the nail size.  I got almost no chatter with the 11.5 reamer.  The nail was measured in length, selected, opened,  assembled, and then delivered down the tibia across the fracture site. Appropriate alignment confirmed on AP and lateral views. The length was also  confirmed.  I debated using a 34, but felt that this would be pushing the length limit, potentially, and given the proximal nature of the fracture I did not think getting the full length of the tibia would be structurally of substantial benefit.  I then secured the nail with proximal interlocking bolts, and also used perfect circles technique to secure the nail distally with interlocking bolts.  I did place 3 proximal interlocking bolts to augment fixation.  I took care to confirm fracture apposition as well as rotational alignment clinically and radiographically prior to securing the distal segment.  I considered whether or not to remove the provisional plate, but felt given her extreme osteoporotic feeling of the bone that some additional fixation would be of some benefit in order to minimize risk for postoperative flexion of the proximal segment.  Final C-arm pictures were taken, the wounds were irrigated copiously, there were also injected with Marcaine, and the patellar tendon split repaired with Vicryl followed by Vicryl and Monocryl for the subcutaneous tissues. Steri-Strips and sterile gauze was applied followed by a posterior splint. The patient was awakened and returned to the PACU in stable and satisfactory condition. There were no complications.  We did assess her splint prior to the tibial surgery, and it appeared in good condition.  We will plan for close management of the right distal radius fracture.

## 2022-05-10 NOTE — Plan of Care (Signed)
  Problem: Education: Goal: Knowledge of General Education information will improve Description: Including pain rating scale, medication(s)/side effects and non-pharmacologic comfort measures Outcome: Progressing

## 2022-05-10 NOTE — Plan of Care (Signed)
  Problem: Education: Goal: Ability to describe self-care measures that may prevent or decrease complications (Diabetes Survival Skills Education) will improve Outcome: Progressing   Problem: Education: Goal: Knowledge of General Education information will improve Description: Including pain rating scale, medication(s)/side effects and non-pharmacologic comfort measures Outcome: Progressing   Problem: Pain Managment: Goal: General experience of comfort will improve Outcome: Progressing   Problem: Safety: Goal: Ability to remain free from injury will improve Outcome: Progressing

## 2022-05-10 NOTE — Progress Notes (Signed)
Patient has been transported down to the O.R. Report given to [employer], O.R., RN.

## 2022-05-11 DIAGNOSIS — S82201A Unspecified fracture of shaft of right tibia, initial encounter for closed fracture: Secondary | ICD-10-CM | POA: Diagnosis not present

## 2022-05-11 DIAGNOSIS — S82401A Unspecified fracture of shaft of right fibula, initial encounter for closed fracture: Secondary | ICD-10-CM | POA: Diagnosis not present

## 2022-05-11 DIAGNOSIS — W010XXA Fall on same level from slipping, tripping and stumbling without subsequent striking against object, initial encounter: Secondary | ICD-10-CM | POA: Diagnosis not present

## 2022-05-11 DIAGNOSIS — R030 Elevated blood-pressure reading, without diagnosis of hypertension: Secondary | ICD-10-CM | POA: Diagnosis not present

## 2022-05-11 LAB — CBC
HCT: 24.8 % — ABNORMAL LOW (ref 36.0–46.0)
Hemoglobin: 7.9 g/dL — ABNORMAL LOW (ref 12.0–15.0)
MCH: 23.8 pg — ABNORMAL LOW (ref 26.0–34.0)
MCHC: 31.9 g/dL (ref 30.0–36.0)
MCV: 74.7 fL — ABNORMAL LOW (ref 80.0–100.0)
Platelets: 111 10*3/uL — ABNORMAL LOW (ref 150–400)
RBC: 3.32 MIL/uL — ABNORMAL LOW (ref 3.87–5.11)
RDW: 15.2 % (ref 11.5–15.5)
WBC: 10 10*3/uL (ref 4.0–10.5)
nRBC: 0 % (ref 0.0–0.2)

## 2022-05-11 LAB — COMPREHENSIVE METABOLIC PANEL
ALT: 21 U/L (ref 0–44)
AST: 27 U/L (ref 15–41)
Albumin: 2.7 g/dL — ABNORMAL LOW (ref 3.5–5.0)
Alkaline Phosphatase: 53 U/L (ref 38–126)
Anion gap: 8 (ref 5–15)
BUN: 53 mg/dL — ABNORMAL HIGH (ref 8–23)
CO2: 21 mmol/L — ABNORMAL LOW (ref 22–32)
Calcium: 8.2 mg/dL — ABNORMAL LOW (ref 8.9–10.3)
Chloride: 109 mmol/L (ref 98–111)
Creatinine, Ser: 3.08 mg/dL — ABNORMAL HIGH (ref 0.44–1.00)
GFR, Estimated: 15 mL/min — ABNORMAL LOW (ref >60)
Glucose, Bld: 244 mg/dL — ABNORMAL HIGH (ref 70–99)
Potassium: 5.1 mmol/L (ref 3.5–5.1)
Sodium: 138 mmol/L (ref 135–145)
Total Bilirubin: 0.5 mg/dL (ref 0.3–1.2)
Total Protein: 5.7 g/dL — ABNORMAL LOW (ref 6.5–8.1)

## 2022-05-11 LAB — GLUCOSE, CAPILLARY
Glucose-Capillary: 208 mg/dL — ABNORMAL HIGH (ref 70–99)
Glucose-Capillary: 222 mg/dL — ABNORMAL HIGH (ref 70–99)
Glucose-Capillary: 254 mg/dL — ABNORMAL HIGH (ref 70–99)
Glucose-Capillary: 152 mg/dL — ABNORMAL HIGH (ref 70–99)

## 2022-05-11 MED ORDER — INSULIN ASPART 100 UNIT/ML IJ SOLN
0.0000 [IU] | Freq: Every day | INTRAMUSCULAR | Status: DC
  Administered 2022-05-12: 3 [IU] via SUBCUTANEOUS
  Administered 2022-05-14: 2 [IU] via SUBCUTANEOUS
  Administered 2022-05-15 – 2022-05-16 (×2): 3 [IU] via SUBCUTANEOUS

## 2022-05-11 MED ORDER — INSULIN DETEMIR 100 UNIT/ML ~~LOC~~ SOLN
6.0000 [IU] | Freq: Every day | SUBCUTANEOUS | Status: DC
  Administered 2022-05-11 – 2022-05-15 (×5): 6 [IU] via SUBCUTANEOUS
  Filled 2022-05-11 (×5): qty 0.06

## 2022-05-11 MED ORDER — INSULIN ASPART 100 UNIT/ML IJ SOLN
0.0000 [IU] | Freq: Three times a day (TID) | INTRAMUSCULAR | Status: DC
  Administered 2022-05-11: 5 [IU] via SUBCUTANEOUS
  Administered 2022-05-11: 8 [IU] via SUBCUTANEOUS
  Administered 2022-05-11: 5 [IU] via SUBCUTANEOUS
  Administered 2022-05-12: 3 [IU] via SUBCUTANEOUS
  Administered 2022-05-12: 5 [IU] via SUBCUTANEOUS
  Administered 2022-05-13: 8 [IU] via SUBCUTANEOUS
  Administered 2022-05-13 – 2022-05-14 (×4): 3 [IU] via SUBCUTANEOUS
  Administered 2022-05-14: 11 [IU] via SUBCUTANEOUS
  Administered 2022-05-15: 3 [IU] via SUBCUTANEOUS
  Administered 2022-05-15: 5 [IU] via SUBCUTANEOUS
  Administered 2022-05-15: 8 [IU] via SUBCUTANEOUS
  Administered 2022-05-16: 2 [IU] via SUBCUTANEOUS
  Administered 2022-05-16: 5 [IU] via SUBCUTANEOUS
  Administered 2022-05-16 – 2022-05-17 (×2): 3 [IU] via SUBCUTANEOUS
  Administered 2022-05-17 – 2022-05-19 (×5): 2 [IU] via SUBCUTANEOUS

## 2022-05-11 MED ORDER — INSULIN DETEMIR 100 UNIT/ML FLEXPEN: 6.0000 [IU] | PEN_INJECTOR | Freq: Every day | SUBCUTANEOUS | Status: DC

## 2022-05-11 NOTE — Plan of Care (Signed)
Plan of care reviewed and discussed with the patient and her daughter.

## 2022-05-11 NOTE — Plan of Care (Signed)
  Problem: Education: Goal: Knowledge of General Education information will improve Description: Including pain rating scale, medication(s)/side effects and non-pharmacologic comfort measures Outcome: Progressing   Problem: Pain Managment: Goal: General experience of comfort will improve Outcome: Progressing

## 2022-05-11 NOTE — Progress Notes (Signed)
  Progress Note   Patient: Miranda Roman [id number]:[id number] DOB: 1945/09/07 DOA: 05/08/2022     3 DOS: the patient was seen and examined on 05/11/2022   Brief hospital course: 76 y.o. female with medical history significant of mild neurocognitive disorder, HTN, pulmonary HTN, bronchiectasis, T2DM, CKD4 who presents after a fall.    Pt just moved in to live with daughter and was tripped by 2 dogs today and fell. Did not hit her head or lose consciousness. She normally ambulates with walker but was not doing so at the time. Pt found to have R tib/fib fracture of distal radial metaphysis with mild volar apex angulation and acute nondisplaced fx of distal ulnar metaphysis  Assessment and Plan: * Tibia/fibula fracture, right, closed, initial encounter -Right Tib/Fib X-ray showed acute mildly to moderately comminuted and displaced fracture of the proximal tibial metadiaphysis.  -Acute mildly displaced and mild impacted fx of the proximal fibular metaphysis.  -Ortho was consulted. Pt now s/p surgery 05/10/22 -Cont with analgesia as needed -Therapy recs for acute inpt rehab. CIR consulted per family request    Left wrist pain left wrist X-ray reviewed, neg   Right radial fracture -Right wrist with acute fracture of the distal radial metaphysis with mild volar apex angulation. -Acute nondisplaced fx of the distal ulnar metaphysis.  Hand surgery Dr. Greta Doom consulted. Fractures are non-operable and place on splint. Need to follow up outpatient   Benign essential hypertension Continue home meds as tolerated   Type II diabetes mellitus Cont on sensitive SSI at mealtime. Continue long-acting insulin as tolerated  ARF -Cr up to 3.08 -Clinically appears dry with dry membranes, increased thirst -Pt normally follows Dr. Royce Macadamia as outpt -Would continue LR for now -recheck bmet in AM      Subjective: Without complaints this AM  Physical Exam: Vitals:   05/10/22 2334 05/11/22 0550 05/11/22 0821  05/11/22 1440  BP: (!) 123/56 (!) 133/53 (!) 146/66 (!) 148/70  Pulse: 80 73 83 89  Resp: _0 Temp: 98.6 F (37 C) 98.8 F (37.1 C)  97.6 F (36.4 C)  TempSrc: Oral Oral  Oral  SpO2: 100% 98%  94%  Weight:      Height:       General exam: Conversant, in no acute distress Respiratory system: normal chest rise, clear, no audible wheezing Cardiovascular system: regular rhythm, s1-s2 Gastrointestinal system: Nondistended, nontender, pos BS Central nervous system: No seizures, no tremors Extremities: No cyanosis, no joint deformities Skin: No rashes, no pallor Psychiatry: Affect normal // no auditory hallucinations   Data Reviewed:  Labs reviewed: Na 138, K 5.1, Cr 3.08  Family Communication: Pt in room, family at bedside  Disposition: Status is: Inpatient Remains inpatient appropriate because: Severity of illness  Planned Discharge Destination: Rehab    Author: Marylu Lund, MD 05/11/2022 3:31 PM  For on call review [email or web address].

## 2022-05-11 NOTE — Evaluation (Signed)
Occupational Therapy Evaluation Patient Details Name: Miranda Roman MRN: [id number] DOB: 04/19/1946 Today's Date: 05/11/2022   History of Present Illness 76 yo female admitted with R proximal tibia/fibula fx, R distal radius/ulna fx. S/P IM nailing R tibia 11/4. Non-operative management of R wrist fx-splinted. Hx of mild neurocognitive d/o, osteoporosis, pulm HTN, DM, CKD.   Clinical Impression   Miranda Roman is a 76 year old woman admitted to hospital with above medical history and presents with NWB status to both RUE and RLE, generalized weakness, decreased activity tolerance, impaired balance and pain. Patient needing Total assist (+2) for bed transfers, standing and scooting to head of bed. She exhibited difficulty motor planning her movements without the use of her RUE or RLE. She exhibited slow processing as well but could generally follow commands. Patient set up for feeding and grooming but otherwise mao-total assist for ADLs at bed level. Patient will benefit from skilled OT services while in hospital to improve deficits and learn compensatory strategies as needed in order to return to PLOF.  Family is interested in AIR at discharge.       Recommendations for follow up therapy are one component of a multi-disciplinary discharge planning process, led by the attending physician.  Recommendations may be updated based on patient status, additional functional criteria and insurance authorization.   Follow Up Recommendations  Acute inpatient rehab (3hours/day)    Assistance Recommended at Discharge Frequent or constant Supervision/Assistance  Patient can return home with the following A lot of help with bathing/dressing/bathroom;Two people to help with walking and/or transfers;Assistance with cooking/housework;Direct supervision/assist for medications management;Assist for transportation;Help with stairs or ramp for entrance;Direct supervision/assist for financial management     Functional Status Assessment  Patient has had a recent decline in their functional status and demonstrates the ability to make significant improvements in function in a reasonable and predictable amount of time.  Equipment Recommendations  Other (comment) (TBD)    Recommendations for Other Services Rehab consult     Precautions / Restrictions Precautions Precautions: Fall Precaution Comments: R knee pain - hold leg up Restrictions Weight Bearing Restrictions: Yes RUE Weight Bearing: Weight bear through elbow only RLE Weight Bearing: Non weight bearing      Mobility Bed Mobility Overal bed mobility: Needs Assistance Bed Mobility: Supine to Sit, Sit to Supine     Supine to sit: Total assist, +2 for physical assistance, +2 for safety/equipment Sit to supine: Total assist, +2 for physical assistance, +2 for safety/equipment   General bed mobility comments: Pt assisted ~20%. Assist for trunk and bil LEs. Utilized bedpad for scooting, positioning at EOB. Multimodal cueing required. Repeated reminders for NWB for R wrist. Increased time. Pt yelled out in pain with movement of R LE (pt specifically reports pain at front of knee). Sat EOB with Min guard A.    Transfers Overall transfer level: Needs assistance Equipment used: Right platform walker Transfers: Sit to/from Stand Sit to Stand: Total assist, +2 physical assistance, +2 safety/equipment, From elevated surface           General transfer comment: Pt assisted ~20%. Assist to position R UE and R LE for adherence to NWB precautions. Assist to power up, stabilize, control descent. Pt stood for ~30 seconds. She was unable to shuffle or take any hops on L foot. Attetmped lateral scooting along edge of bed-pt unable.      Balance Overall balance assessment: Needs assistance Sitting-balance support: No upper extremity supported Sitting balance-Leahy Scale: Fair  Standing balance-Leahy Scale: Zero                              ADL either performed or assessed with clinical judgement   ADL Overall ADL's : Needs assistance/impaired Eating/Feeding: Set up;Bed level   Grooming: Set up;Bed level   Upper Body Bathing: Moderate assistance;Bed level   Lower Body Bathing: Total assistance;Bed level   Upper Body Dressing : Moderate assistance;Bed level   Lower Body Dressing: Total assistance;Bed level   Toilet Transfer: Total assistance;BSC/3in1;+2 for physical assistance   Toileting- Clothing Manipulation and Hygiene: Total assistance       Functional mobility during ADLs: Total assistance;+2 for physical assistance       Vision   Vision Assessment?: No apparent visual deficits     Perception     Praxis      Pertinent Vitals/Pain Pain Assessment Pain Assessment: Faces Pain Score: 1  Faces Pain Scale: Hurts even more Pain Location: R knee with activity/movement (at rest, pain is ~2/10 per pt) Pain Descriptors / Indicators: Guarding, Grimacing, Moaning Pain Intervention(s): Repositioned, Limited activity within patient's tolerance     Hand Dominance Right   Extremity/Trunk Assessment Upper Extremity Assessment Upper Extremity Assessment: RUE deficits/detail;LUE deficits/detail RUE Deficits / Details: Splint in place from palm to elbow, functional shoulder ROM and strength LUE Deficits / Details: WFL ROM and strength LUE Sensation: WNL LUE Coordination: WNL   Lower Extremity Assessment Lower Extremity Assessment: Defer to PT evaluation   Cervical / Trunk Assessment Cervical / Trunk Assessment: Normal   Communication Communication Communication: No difficulties   Cognition Arousal/Alertness: Awake/alert Behavior During Therapy: WFL for tasks assessed/performed Overall Cognitive Status: Impaired/Different from baseline Area of Impairment: Problem solving                             Problem Solving: Difficulty sequencing, Requires verbal cues, Requires  tactile cues, Slow processing General Comments: Pleasant and can follow commands - grossly alert and oriented. Increased time for slow processing. Difficulty motor planning body     General Comments       Exercises     Shoulder Instructions      Home Living Family/patient expects to be discharged to:: Unsure Living Arrangements: Children                               Additional Comments: shower chair, BSC, RW, uses tub/shower combo though there is a walk in shower in the home, aide from 10-12 M-F      Prior Functioning/Environment Prior Level of Function : Needs assist             Mobility Comments: uses a walker, could walk in the cul-de-sac ADLs Comments: needed assistance for bathing, LB dressing,        OT Problem List: Decreased strength;Decreased range of motion;Decreased activity tolerance;Impaired balance (sitting and/or standing);Decreased cognition;Decreased knowledge of use of DME or AE;Decreased knowledge of precautions;Obesity;Pain;Impaired UE functional use      OT Treatment/Interventions: Self-care/ADL training;Therapeutic exercise;DME and/or AE instruction;Therapeutic activities;Balance training;Patient/family education    OT Goals(Current goals can be found in the care plan section) Acute Rehab OT Goals Patient Stated Goal: to get to chair OT Goal Formulation: With patient Time For Goal Achievement: 05/25/22 Potential to Achieve Goals: Fair  OT Frequency: Min 2X/week    Co-evaluation  AM-PAC OT "6 Clicks" Daily Activity     Outcome Measure Help from another person eating meals?: A Little Help from another person taking care of personal grooming?: A Little Help from another person toileting, which includes using toliet, bedpan, or urinal?: Total Help from another person bathing (including washing, rinsing, drying)?: A Lot Help from another person to put on and taking off regular upper body clothing?: A Lot Help from  another person to put on and taking off regular lower body clothing?: Total 6 Click Score: 12   End of Session Equipment Utilized During Treatment: Gait belt;Other (comment) (platform) Nurse Communication: Mobility status  Activity Tolerance: Patient tolerated treatment well Patient left: in bed;with call bell/phone within reach;with bed alarm set;with family/visitor present  OT Visit Diagnosis: Other abnormalities of gait and mobility (R26.89);Pain                Time: 2119-4174 OT Time Calculation (min): 50 min Charges:  OT General Charges $OT Visit: 1 Visit OT Evaluation $OT Eval Moderate Complexity: 1 Mod  Gustavo Lah, OTR/L [place]  Office [phone]   Lenward Chancellor 05/11/2022, 2:02 PM

## 2022-05-11 NOTE — Progress Notes (Signed)
Orthopedic Tech Progress Note Patient Details:  Miranda Roman January 12, 1946 [id number]  Ortho Devices Type of Ortho Device: Volar splint Ortho Device/Splint Location: RUE Ortho Device/Splint Interventions: Application, Ordered, Adjustment   Post Interventions Patient Tolerated: Well Instructions Provided: Care of device, Poper ambulation with device  [name] 05/11/2022, 2:29 PM

## 2022-05-11 NOTE — Evaluation (Addendum)
Physical Therapy Evaluation Patient Details Name: Miranda Roman MRN: [id number] DOB: [birth year] Today's Date: 05/11/2022  History of Present Illness  76 yo female admitted with R proximal tibia/fibula fx, R distal radius/ulna fx. S/P IM nailing R tibia 11/4. Non-operative management of R wrist fx-splinted. Hx of mild neurocognitive d/o, osteoporosis, pulm HTN, DM, CKD.  Clinical Impression  On eval, pt required Total A +2 for mobility. She was able to sit EOB with Min guard A for static sitting balance. She stood at bedside with R platform walker for ~30 seconds. She was unable to shuffle or take any hopping steps. She was unable to perform a lateral scoot transfer. Will likely require hoyer lift for safe transfers with nursing staff. Will follow and progress activity as tolerated. Family is requesting a CIR consult.        Recommendations for follow up therapy are one component of a multi-disciplinary discharge planning process, led by the attending physician.  Recommendations may be updated based on patient status, additional functional criteria and insurance authorization.  Follow Up Recommendations Acute inpatient rehab (3hours/day) (family is requesting CIR consult. may need snf placement)      Assistance Recommended at Discharge Frequent or constant Supervision/Assistance  Patient can return home with the following  Two people to help with walking and/or transfers;Two people to help with bathing/dressing/bathroom;Assistance with cooking/housework;Assist for transportation;Help with stairs or ramp for entrance    Equipment Recommendations  (to be determined at next venue)  Recommendations for Other Services  Rehab consult;OT consult    Functional Status Assessment Patient has had a recent decline in their functional status and demonstrates the ability to make significant improvements in function in a reasonable and predictable amount of time.     Precautions / Restrictions  Precautions Precautions: Fall Restrictions Weight Bearing Restrictions: Yes RUE Weight Bearing: Weight bear through elbow only (NWB through wrist) RLE Weight Bearing: Non weight bearing      Mobility  Bed Mobility Overal bed mobility: Needs Assistance Bed Mobility: Supine to Sit, Sit to Supine     Supine to sit: Total assist, +2 for physical assistance, +2 for safety/equipment Sit to supine: Total assist, +2 for physical assistance, +2 for safety/equipment   General bed mobility comments: Pt assisted ~20%. Assist for trunk and bil LEs. Utilized bedpad for scooting, positioning at EOB. Multimodal cueing required. Repeated reminders for NWB for R wrist. Increased time. Pt yelled out in pain with movement of R LE (pt specifically reports pain at front of knee). Sat EOB with Min guard A.    Transfers Overall transfer level: Needs assistance Equipment used: Right platform walker Transfers: Sit to/from Stand Sit to Stand: Total assist, +2 physical assistance, +2 safety/equipment, From elevated surface           General transfer comment: Pt assisted ~20%. Assist to position R UE and R LE for adherence to NWB precautions. Assist to power up, stabilize, control descent. Pt stood for ~30 seconds. She was unable to shuffle or take any hops on L foot. Attetmped lateral scooting along edge of bed-pt unable.    Ambulation/Gait               General Gait Details: Nt-pt unable  Stairs            Wheelchair Mobility    Modified Rankin (Stroke Patients Only)       Balance Overall balance assessment: History of Falls, Needs assistance Sitting-balance support: Feet supported, Single extremity supported Sitting balance-Leahy Scale:  Fair       Standing balance-Leahy Scale: Zero                               Pertinent Vitals/Pain Pain Assessment Pain Assessment: Faces Faces Pain Scale: Hurts even more Pain Location: R knee with activity/movement (at rest,  pain is ~2/10 per pt) Pain Descriptors / Indicators: Guarding, Grimacing, Moaning Pain Intervention(s): Limited activity within patient's tolerance, Monitored during session, Repositioned    Home Living Family/patient expects to be discharged to:: Unsure Living Arrangements: Children                 Additional Comments: shower chair, BSC, RW, uses tub/shower combo though there is a walk in shower in the home, aide from 10-12 M-F    Prior Function Prior Level of Function : Needs assist             Mobility Comments: uses a walker, could walk in the cul-de-sac ADLs Comments: needed assistance for bathing, LB dressing,     Hand Dominance   Dominant Hand: Right    Extremity/Trunk Assessment   Upper Extremity Assessment Upper Extremity Assessment: Defer to OT evaluation    Lower Extremity Assessment Lower Extremity Assessment:  (R lower leg/foot in short splint. Pt unable to mobilize R LE without assistance)    Cervical / Trunk Assessment Cervical / Trunk Assessment: Normal  Communication   Communication: No difficulties  Cognition Arousal/Alertness: Awake/alert Behavior During Therapy: WFL for tasks assessed/performed Overall Cognitive Status: Impaired/Different from baseline Area of Impairment: Problem solving                             Problem Solving: Difficulty sequencing, Requires verbal cues, Requires tactile cues          General Comments      Exercises     Assessment/Plan    PT Assessment Patient needs continued PT services  PT Problem List Decreased strength;Decreased mobility;Decreased range of motion;Decreased activity tolerance;Decreased balance;Decreased knowledge of use of DME;Decreased coordination;Decreased safety awareness;Pain;Decreased knowledge of precautions       PT Treatment Interventions DME instruction;Gait training;Therapeutic activities;Therapeutic exercise;Patient/family education;Balance training;Functional  mobility training    PT Goals (Current goals can be found in the Care Plan section)  Acute Rehab PT Goals Patient Stated Goal: less pain. PT Goal Formulation: With patient/family Time For Goal Achievement: 05/25/22 Potential to Achieve Goals: Good    Frequency Min 3X/week     Co-evaluation               AM-PAC PT "6 Clicks" Mobility  Outcome Measure Help needed turning from your back to your side while in a flat bed without using bedrails?: Total Help needed moving from lying on your back to sitting on the side of a flat bed without using bedrails?: Total Help needed moving to and from a bed to a chair (including a wheelchair)?: Total Help needed standing up from a chair using your arms (e.g., wheelchair or bedside chair)?: Total Help needed to walk in hospital room?: Total Help needed climbing 3-5 steps with a railing? : Total 6 Click Score: 6    End of Session Equipment Utilized During Treatment: Gait belt Activity Tolerance: Patient limited by pain Patient left: in bed;with call bell/phone within reach;with bed alarm set;with family/visitor present   PT Visit Diagnosis: History of falling (Z91.81);Pain;Muscle weakness (generalized) (M62.81);Other abnormalities of gait and  mobility (R26.89) Pain - Right/Left: Right Pain - part of body: Knee    Time: 8850-2774 PT Time Calculation (min) (ACUTE ONLY): 51 min   Charges:   PT Evaluation $PT Eval Moderate Complexity: 1 Mod             Doreatha Massed, PT Acute Rehabilitation  Office: [phone]

## 2022-05-11 NOTE — Progress Notes (Addendum)
Orthopaedic Trauma Progress Note  SUBJECTIVE: Doing ok today, pain controlled.  Eating lunch currently.  Because of current splint, unable to use right elbow for weightbearing, asking if this can be transition to a different kind of splint to free up the elbow.  Denies any chest pain, shortness of breath.  No other specific concerns currently.  Family at bedside  OBJECTIVE:  Vitals:   05/11/22 0550 05/11/22 0821  BP: (!) 133/53 (!) 146/66  Pulse: 73 83  Resp: 18   Temp: 98.8 F (37.1 C)   SpO2: 98%     General: Sitting up in bed eating lunch, no acute distress Respiratory: No increased work of breathing.  Right upper extremity: Sugar-tong splint in place.  Nontender above splint.  Able to wiggle fingers.  Minimal to no swelling about the fingers.  Hand warm and well-perfused  IMAGING: Stable post op imaging.   LABS:  Results for orders placed or performed during the hospital encounter of 05/08/22 (from the past 24 hour(s))  Glucose, capillary     Status: Abnormal   Collection Time: 05/10/22  4:54 PM  Result Value Ref Range   Glucose-Capillary 302 (H) 70 - 99 mg/dL  Glucose, capillary     Status: Abnormal   Collection Time: 05/10/22  7:56 PM  Result Value Ref Range   Glucose-Capillary 435 (H) 70 - 99 mg/dL  Glucose, random     Status: Abnormal   Collection Time: 05/10/22  8:32 PM  Result Value Ref Range   Glucose, Bld 483 (H) 70 - 99 mg/dL  Glucose, capillary     Status: Abnormal   Collection Time: 05/10/22 10:18 PM  Result Value Ref Range   Glucose-Capillary 447 (H) 70 - 99 mg/dL  CBC     Status: Abnormal   Collection Time: 05/11/22  3:10 AM  Result Value Ref Range   WBC 10.0 4.0 - 10.5 K/uL   RBC 3.32 (L) 3.87 - 5.11 MIL/uL   Hemoglobin 7.9 (L) 12.0 - 15.0 g/dL   HCT 24.8 (L) 36.0 - 46.0 %   MCV 74.7 (L) 80.0 - 100.0 fL   MCH 23.8 (L) 26.0 - 34.0 pg   MCHC 31.9 30.0 - 36.0 g/dL   RDW 15.2 11.5 - 15.5 %   Platelets 111 (L) 150 - 400 K/uL   nRBC 0.0 0.0 - 0.2 %   Comprehensive metabolic panel     Status: Abnormal   Collection Time: 05/11/22  3:10 AM  Result Value Ref Range   Sodium 138 135 - 145 mmol/L   Potassium 5.1 3.5 - 5.1 mmol/L   Chloride 109 98 - 111 mmol/L   CO2 21 (L) 22 - 32 mmol/L   Glucose, Bld 244 (H) 70 - 99 mg/dL   BUN 53 (H) 8 - 23 mg/dL   Creatinine, Ser 3.08 (H) 0.44 - 1.00 mg/dL   Calcium 8.2 (L) 8.9 - 10.3 mg/dL   Total Protein 5.7 (L) 6.5 - 8.1 g/dL   Albumin 2.7 (L) 3.5 - 5.0 g/dL   AST 27 15 - 41 U/L   ALT 21 0 - 44 U/L   Alkaline Phosphatase 53 38 - 126 U/L   Total Bilirubin 0.5 0.3 - 1.2 mg/dL   GFR, Estimated 15 (L) >60 mL/min   Anion gap 8 5 - 15  Glucose, capillary     Status: Abnormal   Collection Time: 05/11/22  7:46 AM  Result Value Ref Range   Glucose-Capillary 208 (H) 70 - 99 mg/dL  Glucose, capillary     Status: Abnormal   Collection Time: 05/11/22 12:00 PM  Result Value Ref Range   Glucose-Capillary 222 (H) 70 - 99 mg/dL    ASSESSMENT: Miranda Roman is a 76 y.o. female, 1 Day Post-Op s/p  OPEN REDUCTION INTERNAL FIXATION (ORIF) TIBIA/FIBULA FRACTURE Nonoperative management right distal radius fracture  CV/Blood loss: Acute blood loss anemia, Hgb 7.9 this morning.  Continue to monitor. Hemodynamically stable  PLAN: Weightbearing: NWB RLE.  Okay to WB through  R elbow, NWB through wrist ROM:  RUE - Okay for elbow and shoulder ROM as tolerated RLE - Knee motion as tolerated.  Maintain splint Incisional and dressing care: Dressings left intact until follow-up  Showering: Okay to shower.  Keep RUE and RLE dressings dry Orthopedic device(s): Splint RUE and RLE Pain management: Continue current regimen VTE prophylaxis: Lovenox, SCDs ID:  Ancef 2gm post op Foley/Lines: No foley, KVO IVFs Dispo: PT/OT eval today.  We will transition to volar splint for the RUE to allow weightbearing through the right elbow.  Continue to monitor CBC   Follow - up plan: 2 weeks with Dr. Preston Fleeting  information: After hours and holidays please check [email or web address] for group call information for [facility]   Gwinda Passe, PA-C [phone] (office) [email or web address]

## 2022-05-12 ENCOUNTER — Encounter (HOSPITAL_COMMUNITY): Payer: Self-pay | Admitting: Orthopedic Surgery

## 2022-05-12 DIAGNOSIS — W010XXA Fall on same level from slipping, tripping and stumbling without subsequent striking against object, initial encounter: Secondary | ICD-10-CM | POA: Diagnosis not present

## 2022-05-12 DIAGNOSIS — S82401A Unspecified fracture of shaft of right fibula, initial encounter for closed fracture: Secondary | ICD-10-CM | POA: Diagnosis not present

## 2022-05-12 DIAGNOSIS — S82201A Unspecified fracture of shaft of right tibia, initial encounter for closed fracture: Secondary | ICD-10-CM | POA: Diagnosis not present

## 2022-05-12 DIAGNOSIS — R030 Elevated blood-pressure reading, without diagnosis of hypertension: Secondary | ICD-10-CM | POA: Diagnosis not present

## 2022-05-12 LAB — COMPREHENSIVE METABOLIC PANEL
ALT: 7 U/L (ref 0–44)
AST: 18 U/L (ref 15–41)
Albumin: 2.7 g/dL — ABNORMAL LOW (ref 3.5–5.0)
Alkaline Phosphatase: 55 U/L (ref 38–126)
Anion gap: 7 (ref 5–15)
BUN: 51 mg/dL — ABNORMAL HIGH (ref 8–23)
CO2: 23 mmol/L (ref 22–32)
Calcium: 8.3 mg/dL — ABNORMAL LOW (ref 8.9–10.3)
Chloride: 109 mmol/L (ref 98–111)
Creatinine, Ser: 2.36 mg/dL — ABNORMAL HIGH (ref 0.44–1.00)
GFR, Estimated: 21 mL/min — ABNORMAL LOW (ref >60)
Glucose, Bld: 137 mg/dL — ABNORMAL HIGH (ref 70–99)
Potassium: 4.4 mmol/L (ref 3.5–5.1)
Sodium: 139 mmol/L (ref 135–145)
Total Bilirubin: 0.7 mg/dL (ref 0.3–1.2)
Total Protein: 5.6 g/dL — ABNORMAL LOW (ref 6.5–8.1)

## 2022-05-12 LAB — GLUCOSE, CAPILLARY
Glucose-Capillary: 115 mg/dL — ABNORMAL HIGH (ref 70–99)
Glucose-Capillary: 163 mg/dL — ABNORMAL HIGH (ref 70–99)
Glucose-Capillary: 221 mg/dL — ABNORMAL HIGH (ref 70–99)
Glucose-Capillary: 294 mg/dL — ABNORMAL HIGH (ref 70–99)

## 2022-05-12 LAB — CBC
HCT: 24.5 % — ABNORMAL LOW (ref 36.0–46.0)
Hemoglobin: 7.9 g/dL — ABNORMAL LOW (ref 12.0–15.0)
MCH: 23.6 pg — ABNORMAL LOW (ref 26.0–34.0)
MCHC: 32.2 g/dL (ref 30.0–36.0)
MCV: 73.1 fL — ABNORMAL LOW (ref 80.0–100.0)
Platelets: 109 10*3/uL — ABNORMAL LOW (ref 150–400)
RBC: 3.35 MIL/uL — ABNORMAL LOW (ref 3.87–5.11)
RDW: 15.1 % (ref 11.5–15.5)
WBC: 11.4 10*3/uL — ABNORMAL HIGH (ref 4.0–10.5)
nRBC: 0 % (ref 0.0–0.2)

## 2022-05-12 NOTE — Plan of Care (Incomplete)
  Problem: Education: Goal: Ability to describe self-care measures that may prevent or decrease complications (Diabetes Survival Skills Education) will improve Outcome: Progressing   Problem: Education: Goal: Knowledge of General Education information will improve Description: Including pain rating scale, medication(s)/side effects and non-pharmacologic comfort measures Outcome: Progressing

## 2022-05-12 NOTE — TOC Progression Note (Signed)
Transition of Care [facility]) - Progression Note    Patient Details  Name: Miranda Roman MRN: [id number] Date of Birth: 10-Oct-1945  Transition of Care [facility]) CM/SW Contact  Lennart Pall, [place] Phone Number: 05/12/2022, 3:22 PM  Clinical Narrative:     Met with pt and daughter today to introduce CSW/ TOC role with dc planning needs.  Pt reports she has been living with her daughter and son-in-law who are able to provide intermittent assist, however, both are working.  Daughter has arranged for a caregiver ~2hrs/ day to assist pt but she is alone ~ 6 hrs each day.  Both admit frustration with her fall/ fx because she was not using her RW and tripped over dogs in the home.   They are agreed that she now requires rehab prior to a return to daughter's home and are most interested in CIR if possible.  Currently awaiting CIR screen to take place.  IF she is not accepted by CIR, daughter notes plan would be for SNF.  Continue to follow.  Expected Discharge Plan: [place] (vs. SNF) Barriers to Discharge: Continued Medical Work up, Ship broker  Expected Discharge Plan and Services Expected Discharge Plan: [place] (vs. SNF) In-house Referral: Clinical Social Work   Post Acute Care Choice: IP Rehab, [place] Living arrangements for the past 2 months: Single Family Home                                       Social Determinants of Health (SDOH) Interventions    Readmission Risk Interventions    05/12/2022    3:18 PM  Readmission Risk Prevention Plan  Transportation Screening Complete  PCP or Specialist Appt within 5-7 Days Complete  Home Care Screening Complete  Medication Review (RN CM) Complete

## 2022-05-12 NOTE — Progress Notes (Signed)
Physical Therapy Treatment Patient Details Name: Miranda Roman MRN: [id number] DOB: 1945/08/10 Today's Date: 05/12/2022   History of Present Illness 76 yo female admitted with R proximal tibia/fibula fx, R distal radius/ulna fx. S/P IM nailing R tibia 11/4. Non-operative management of R wrist fx-splinted. Hx of mild neurocognitive d/o, osteoporosis, pulm HTN, DM, CKD.    PT Comments    POD # 2 am session Pt is AxO x 3 very pleasant Lady but apprehensive about getting OOB.  Required increased time and positive reinforcement throughout session.  Pt was pre medicated for pain as well.  Pt has a new cast/splint on R UE so now she has elbow mvmt and able to WB thru Elbow using R Platform walker.   Assisted OOB to recliner was difficult and required + 2 assist. General bed mobility comments: 75% VC's on proper tech, increased time, Educated on use of belt to self guide R LE but over all difficult due to R UE in splint @ WBing thru elbow only.  Utilized bed pad to complete scooting.  Mild c/o dizziness EOB which decreased with increased rest period. General transfer comment: attempted sit to stand twice requiring + 2 MAX side by side Assist from elevated bed.  Pt unable to fully support her weight through R LE and present with B UE weakness even despite using R Platform.  Unable to support self.  Performed a partial 1/4 pivot towards her LEFT to recliner.  Rec Maxi Move back to bed. Pt would benefit from Inpt Rehab prior to returning home with family.    Recommendations for follow up therapy are one component of a multi-disciplinary discharge planning process, led by the attending physician.  Recommendations may be updated based on patient status, additional functional criteria and insurance authorization.  Follow Up Recommendations  Acute inpatient rehab (3hours/day)     Assistance Recommended at Discharge Frequent or constant Supervision/Assistance  Patient can return home with the following Two  people to help with walking and/or transfers;Two people to help with bathing/dressing/bathroom;Assistance with cooking/housework;Assist for transportation;Help with stairs or ramp for entrance   Equipment Recommendations       Recommendations for Other Services Rehab consult;OT consult     Precautions / Restrictions Precautions Precautions: Fall Restrictions Weight Bearing Restrictions: No RUE Weight Bearing: Weight bear through elbow only RLE Weight Bearing: Non weight bearing Other Position/Activity Restrictions: used gait belt around R foot to suspend/ensure NWB     Mobility  Bed Mobility   Bed Mobility: Supine to Sit     Supine to sit: Max assist, +2 for physical assistance, +2 for safety/equipment     General bed mobility comments: 75% VC's on proper tech, increased time, Educated on use of belt to self guide R LE but over all difficult due to R UE in splint @ WBing thru elbow only.  Utilized bed pad to complete scooting.  Mild c/o dizziness EOB which decreased with increased rest period.    Transfers Overall transfer level: Needs assistance Equipment used: Right platform walker Transfers: Sit to/from Stand Sit to Stand: Total assist, +2 physical assistance, +2 safety/equipment, From elevated surface           General transfer comment: attempted sit to stand twice requiring + 2 MAX side by side Assist from elevated bed.  Pt unable to fully support her weight through R LE and present with B UE weakness even despite using R Platform.  Unable to support self.  Performed a partial 1/4 pivot towards  her LEFT to recliner.  Rec Maxi Move back to bed.    Ambulation/Gait               General Gait Details: Nt-pt unable   Stairs             Wheelchair Mobility    Modified Rankin (Stroke Patients Only)       Balance                                            Cognition Arousal/Alertness: Awake/alert Behavior During Therapy: WFL for  tasks assessed/performed Overall Cognitive Status: Within Functional Limits for tasks assessed                                 General Comments: AxO x 3 very pleasant Lady with mild fear of falling as well as fear of more pain.        Exercises      General Comments        Pertinent Vitals/Pain Pain Assessment Pain Assessment: 0-10 Pain Score: 6  Pain Location: R knee with activity/movement Pain Descriptors / Indicators: Guarding, Grimacing, Moaning Pain Intervention(s): Monitored during session, Premedicated before session, Repositioned    Home Living                          Prior Function            PT Goals (current goals can now be found in the care plan section) Progress towards PT goals: Progressing toward goals    Frequency    Min 3X/week      PT Plan Current plan remains appropriate    Co-evaluation              AM-PAC PT "6 Clicks" Mobility   Outcome Measure  Help needed turning from your back to your side while in a flat bed without using bedrails?: A Lot Help needed moving from lying on your back to sitting on the side of a flat bed without using bedrails?: A Lot Help needed moving to and from a bed to a chair (including a wheelchair)?: A Lot Help needed standing up from a chair using your arms (e.g., wheelchair or bedside chair)?: A Lot Help needed to walk in hospital room?: Total Help needed climbing 3-5 steps with a railing? : Total 6 Click Score: 10    End of Session Equipment Utilized During Treatment: Gait belt Activity Tolerance: Patient limited by pain;Other (comment) (weakness/fear) Patient left: in chair;with call bell/phone within reach;with family/visitor present Nurse Communication: Mobility status PT Visit Diagnosis: History of falling (Z91.81);Pain;Muscle weakness (generalized) (M62.81);Other abnormalities of gait and mobility (R26.89) Pain - Right/Left: Right Pain - part of body: Knee      Time: 1100-1125 PT Time Calculation (min) (ACUTE ONLY): 25 min  Charges:  $Therapeutic Activity: 23-37 mins                     Rica Koyanagi  PTA Acute  Rehabilitation Services Office M-F          [phone] Weekend pager [phone]

## 2022-05-12 NOTE — PMR Pre-admission (Signed)
PMR Admission Coordinator Pre-Admission Assessment  Patient: Miranda Roman is an 76 y.o., female MRN: [id number] DOB: 1946-06-15 Height: _0  (162.6 cm) Weight: 78.2 kg  Insurance Information HMO: yes    PPO:      PCP:      IPA:      80/20:      OTHER:  PRIMARY: [employer] MCR      Policy#: [id number]      Subscriber: patient CM Name: tbd    Phone#: online- [email or web address]     Fax#: [phone] Pre-Cert#: [id number]  Approval received from [facility] for 7 days 05/17/22-05/23/22. Please fax to continue stay request to above fax number.     Eff. Date: 12/05/21     Deduct: $0      Out of Pocket Max: $4,500 ($2,026.49 met)       CIR: $325/day copay days 1-5, $0 day/days 6+      SNF: $0/day days 1-20, $196/day days 21-43, $0/day days 44-100, limited to 100 days/cal year Outpatient: $20 co-pay/visit      Home Health: 100% coverage     DME: 80% coverage     20% co-insurance Providers: patient choice  The "Data Collection Information Summary" for patients in Inpatient Rehabilitation Facilities with attached "Privacy Act [place] Records" was provided and verbally reviewed with: Family  Emergency Contact Information Contact Information     Name Relation Home Work Michie Daughter [phone]  [phone]   Jamilex, Bohnsack [phone]  [phone]   Caili, Escalera [phone]  [phone]       Current Medical History  Patient Admitting Diagnosis: Right tib/fib fracture, R wrist fracture History of Present Illness: Patient presented to [facility] 05/08/22 and was tripped by 2 dogs today and fell. Did not hit her head or lose consciousness. She normally ambulates with walker but was not doing so at the time. Not on aspirin or anticoagulation. Not complaining of much pain other than left wrist pain.  WBC 12.4. Hgb of 10.4. Right Tib/Fib X-ray showed acute mildly to moderately comminuted and displaced fracture of the proximal tibial metadiaphysis. Acute mildly  displaced and mild impacted fx of the proximal fibular metaphysis. Right wrist with acute fracture of the distal radial metaphysis with mild volar apex angulation. Acute nondisplaced fx of the distal ulnar metaphysis. Hand surgery Dr. Greta Doom consulted. Fractures are non-operable and place on splint. S/P right LE ORIF 05/10/22. Patient is NWB on R LE, NWB on right wirst. Can bear weight through right elbow. Patient with acute renal failure Cr up to 3.08. Patient developed fever of 103 degrees 05/16/22 and is now on IV antibiotics with diagnosis of pneumonia. Therapies recommending intensive rehab.     Patient's medical record from [facility] has been reviewed by the rehabilitation admission coordinator and physician.  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   Borderline steroid-induced glaucoma, 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   OD, Early elevated intraocular pressure to 28 mm we will discontinue  all topical medications from surgery in the right eye today   Bronchiectasis without complication 99/24/2683   Cataract 07/03/2020   Chest pain at rest 01/30/2015   Chronic kidney disease, stage 4 (severe) 12/07/2020   Closed fracture of right tibial plateau 07/17/2021   Cyst of left kidney 03/10/2019   Needs repeated US 08/2019   Dysphagia, neurologic    Elevated d-dimer 12/20/2019   Facet arthritis of lumbar region 02/27/2019   Gastroesophageal  reflux disease 08/26/2016   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  Further history patient suffered possible Wernicke's encephalopathy the past, a type of the vitamin deficiencies, thiamine that can also affect the retina if there is significant damage which may not be recoverable.   Mild neurocognitive disorder due to multiple etiologies 01/23/2022   Osteoporosis 12/15/2018   Overweight with body mass index (BMI) 25.0-29.9 12/15/2018   PAD (peripheral artery disease) 06/06/2019   Pain in right hand 04/27/2020   Pancreatic cyst 06/06/2019   Polyneuropathy due to type 2 diabetes mellitus    Posterior vitreous detachment of left eye 08/05/2021   Proliferative diabetic retinopathy of right eye 08/22/2021   Likely contributory cause of nonclearing vitreous hemorrhage in the right eye now status post PRP likely to resolve in 2 quiescent PDR, post vitrectomy PRP   Pulmonary hypertension 02/04/2021   Right knee pain 07/10/2021   Sickle-cell trait 12/07/2020   Type II diabetes mellitus 03/10/2012   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   Vitreous hemorrhage of right eye 08/05/2021   Vitrectomy PRP 08-14-2021    Has the patient had major surgery during 100 days prior to admission? Yes  Family History   family history includes Diabetes in her brother, father, maternal aunt, maternal grandfather, maternal grandmother, maternal uncle, mother, and sister; Healthy in her daughter, son, and son; Heart  disease in her father and mother; Hypertension in her brother and mother; Sickle cell anemia in her brother, sister, and sister; Stroke in her mother.  Current Medications  Current Facility-Administered Medications:    0.9 %  sodium chloride infusion, , Intravenous, PRN, Donne Hazel, MD, Stopped at 05/10/22 1825   acetaminophen (TYLENOL) tablet 325-650 mg, 325-650 mg, Oral, Q6H PRN, [name], PA-C, 650 mg at 05/12/22 1021   amLODipine (NORVASC) tablet 2.5 mg, 2.5 mg, Oral, Daily, Brown, Blaine K, PA-C, 2.5 mg at 05/12/22 0931   atorvastatin (LIPITOR) tablet 20 mg, 20 mg, Oral, Daily, Brown, Blaine K, PA-C, 20 mg at 05/12/22 4196   bisacodyl (DULCOLAX) suppository 10 mg, 10 mg, Rectal, Daily PRN, Owens Shark, Blaine K, PA-C   calcitRIOL (ROCALTROL) capsule 0.25 mcg, 0.25 mcg, Oral, Daily, Brown, Blaine K, PA-C, 0.25 mcg at 05/12/22 0932   cyanocobalamin (VITAMIN B12) tablet 1,000 mcg, 1,000 mcg, Oral, Daily, Owens Shark, Blaine K, PA-C, 1,000 mcg at 05/12/22 0929   diphenhydrAMINE (BENADRYL) 12.5 MG/5ML elixir 12.5-25 mg, 12.5-25 mg, Oral, Q4H PRN, [name], PA-C, 25 mg at 05/10/22 2135   docusate sodium (COLACE) capsule 100 mg, 100  mg, Oral, BID, [name], PA-C, 100 mg at 05/12/22 0931   enoxaparin (LOVENOX) injection 30 mg, 30 mg, Subcutaneous, Q24H, Brown, Blaine K, PA-C, 30 mg at 05/12/22 0932   gabapentin (NEURONTIN) capsule 100 mg, 100 mg, Oral, QHS, Brown, Blaine K, PA-C, 100 mg at 05/11/22 2133   insulin aspart (novoLOG) injection 0-15 Units, 0-15 Units, Subcutaneous, TID WC, Donne Hazel, MD, 3 Units at 05/12/22 1314   insulin aspart (novoLOG) injection 0-5 Units, 0-5 Units, Subcutaneous, QHS, Donne Hazel, MD   insulin detemir (LEVEMIR) injection 6 Units, 6 Units, Subcutaneous, Daily, Donne Hazel, MD, 6 Units at 05/12/22 0932   lactated ringers infusion, , Intravenous, Continuous, Donne Hazel, MD, Last Rate: 50 mL/hr at 05/12/22 0745, Rate Change at 05/12/22  0745   loratadine (CLARITIN) tablet 10 mg, 10 mg, Oral, Daily, Owens Shark, Blaine K, PA-C, 10 mg at 05/11/22 2135   magnesium citrate solution 1 Bottle, 1 Bottle, Oral, Once PRN, Owens Shark, Blaine K, PA-C   methocarbamol (ROBAXIN) tablet 500 mg, 500 mg, Oral, Q6H PRN, 500 mg at 05/12/22 1020 **OR** methocarbamol (ROBAXIN) 500 mg in dextrose 5 % 50 mL IVPB, 500 mg, Intravenous, Q6H PRN, Owens Shark, Blaine K, PA-C   metoCLOPramide (REGLAN) tablet 5-10 mg, 5-10 mg, Oral, Q8H PRN **OR** metoCLOPramide (REGLAN) injection 5-10 mg, 5-10 mg, Intravenous, Q8H PRN, Owens Shark, Blaine K, PA-C   metoprolol tartrate (LOPRESSOR) tablet 50 mg, 50 mg, Oral, BID, Brown, Blaine K, PA-C, 50 mg at 05/12/22 0929   morphine (PF) 2 MG/ML injection 0.5-1 mg, 0.5-1 mg, Intravenous, Q4H PRN, Owens Shark, Blaine K, PA-C, 0.5 mg at 05/11/22 2238   ondansetron (ZOFRAN) tablet 4 mg, 4 mg, Oral, Q6H PRN **OR** ondansetron (ZOFRAN) injection 4 mg, 4 mg, Intravenous, Q6H PRN, Owens Shark, Blaine K, PA-C   polyethylene glycol (MIRALAX / GLYCOLAX) packet 17 g, 17 g, Oral, Daily PRN, Owens Shark, Blaine K, PA-C   senna (SENOKOT) tablet 8.6 mg, 1 tablet, Oral, BID, Brown, Blaine K, PA-C, 8.6 mg at 05/12/22 0930   sodium bicarbonate tablet 650 mg, 650 mg, Oral, BID, [name] K, PA-C, 650 mg at 05/12/22 0931   traMADol (ULTRAM) tablet 50 mg, 50 mg, Oral, Q12H PRN, [name], PA-C, 50 mg at 05/12/22 0018  Patients Current Diet:  Diet Order             Diet Carb Modified Fluid consistency: Thin; Room service appropriate? Yes  Diet effective now                   Precautions / Restrictions Precautions Precautions: Fall Precaution Comments: R knee pain - hold leg up Restrictions Weight Bearing Restrictions: Yes RUE Weight Bearing: Non weight bearing RLE Weight Bearing: Non weight bearing Other Position/Activity Restrictions: Can bear weight through right elbow, not wrist   Has the patient had 2 or more falls or a fall with injury in the past year?  Yes  Prior Activity Level    Prior Functional Level Self Care: Did the patient need help bathing, dressing, using the toilet or eating? Needed some help  Indoor Mobility: Did the patient need assistance with walking from room to room (with or without device)? Needed some help  Stairs: Did the patient need assistance with internal or external stairs (with or without device)? Needed some help  Functional Cognition: Did the patient need help planning regular tasks such as shopping or remembering to take medications? Needed some help  Patient Information Are you of Hispanic, Latino/a,or  Spanish origin?: A. No, not of Hispanic, Latino/a, or Spanish origin What is your race?: B. Black or African American Do you need or want an interpreter to communicate with a doctor or health care staff?: 0. No  Patient's Response To:  Health Literacy and Transportation Is the patient able to respond to health literacy and transportation needs?: Yes Health Literacy - How often do you need to have someone help you when you read instructions, pamphlets, or other written material from your doctor or pharmacy?: Sometimes In the past 12 months, has lack of transportation kept you from medical appointments or from getting medications?: No In the past 12 months, has lack of transportation kept you from meetings, work, or from getting things needed for daily living?: No  Home Assistive Devices / [place] Devices/Equipment: Environmental consultant (specify type), Cane (specify quad or straight), Wheelchair, Bedside commode/3-in-1, Shower chair without back, CBG Meter, Eyeglasses (shower bench - 1/2 has back and 1/2 is without a back)  Prior Device Use: Indicate devices/aids used by the patient prior to current illness, exacerbation or injury? Walker  Current Functional Level Cognition  Overall Cognitive Status: Within Functional Limits for tasks assessed Orientation Level: Oriented X4 General Comments: AxO x 3  very pleasant Lady with mild fear of falling as well as fear of more pain.    Extremity Assessment (includes Sensation/Coordination)  Upper Extremity Assessment: RUE deficits/detail, LUE deficits/detail RUE Deficits / Details: Splint in place from palm to elbow, functional shoulder ROM and strength LUE Deficits / Details: WFL ROM and strength LUE Sensation: WNL LUE Coordination: WNL  Lower Extremity Assessment: Defer to PT evaluation    ADLs  Overall ADL's : Needs assistance/impaired Eating/Feeding: Set up, Bed level Grooming: Set up, Bed level Upper Body Bathing: Moderate assistance, Bed level Lower Body Bathing: Total assistance, Bed level Upper Body Dressing : Moderate assistance, Bed level Lower Body Dressing: Total assistance, Bed level Toilet Transfer: Total assistance, BSC/3in1, +2 for physical assistance Toileting- Clothing Manipulation and Hygiene: Total assistance Functional mobility during ADLs: Total assistance, +2 for physical assistance    Mobility  Overal bed mobility: Needs Assistance Bed Mobility: Supine to Sit Supine to sit: Max assist, +2 for physical assistance, +2 for safety/equipment Sit to supine: Total assist, +2 for physical assistance, +2 for safety/equipment General bed mobility comments: 75% VC's on proper tech, increased time, Educated on use of belt to self guide R LE but over all difficult due to R UE in splint @ WBing thru elbow only.  Utilized bed pad to complete scooting.  Mild c/o dizziness EOB which decreased with increased rest period.    Transfers  Overall transfer level: Needs assistance Equipment used: Right platform walker Transfers: Sit to/from Stand Sit to Stand: Total assist, +2 physical assistance, +2 safety/equipment, From elevated surface General transfer comment: attempted sit to stand twice requiring + 2 MAX side by side Assist from elevated bed.  Pt unable to fully support her weight through R LE and present with B UE weakness even  despite using R Platform.  Unable to support self.  Performed a partial 1/4 pivot towards her LEFT to recliner.  Rec Maxi Move back to bed.    Ambulation / Gait / Stairs / Wheelchair Mobility  Ambulation/Gait General Gait Details: Nt-pt unable    Posture / Balance Balance Overall balance assessment: Needs assistance Sitting-balance support: No upper extremity supported Sitting balance-Leahy Scale: Fair Standing balance-Leahy Scale: Zero    Special needs/care consideration Skin surgical wound  Previous Home Environment (from acute therapy documentation) Living Arrangements: Children  Lives With: Daughter Available Help at Discharge: Family, Available 24 hours/day, Other (Comment) (has aide) Type of Home: House Home Layout: Two level, 1/2 bath on main level, Able to live on main level with bedroom/bathroom Alternate Level Stairs-Number of Steps: flight Home Access: Stairs to enter Entrance Stairs-Rails: None Entrance Stairs-Number of Steps: 3 Bathroom Toilet: Handicapped height Bathroom Accessibility: No Home Care Services: Yes Type of [place]: [place] (if known): private Additional Comments: shower chair, BSC, RW, uses tub/shower combo though there is a walk in shower in the home, aide from 10-12 M-F  Discharge Living Setting Plans for Discharge Living Setting: Lives with (comment), House (daughter) Discharge Home Layout: Two level, 1/2 bath on main level Alternate Level Stairs-Number of Steps: flight Discharge Home Access: Stairs to enter Entrance Stairs-Rails: None Entrance Stairs-Number of Steps: 3 Discharge Bathroom Toilet: Handicapped height Discharge Bathroom Accessibility: No Does the patient have any problems obtaining your medications?: No  Social/Family/Support Systems Anticipated Caregiver: Daughter Research scientist (physical sciences) Information: [phone] Caregiver Availability: 24/7 Discharge Plan Discussed with  Primary Caregiver: Yes Is Caregiver In Agreement with Plan?: Yes Does Caregiver/Family have Issues with Lodging/Transportation while Pt is in Rehab?: No  Goals Patient/Family Goal for Rehab: Min A PT, OT Expected length of stay: 12-14 days Pt/Family Agrees to Admission and willing to participate: Yes Program Orientation Provided & Reviewed with Pt/Caregiver Including Roles  & Responsibilities: Yes  Barriers to Discharge: Insurance for SNF coverage  Decrease burden of Care through IP rehab admission: Othern/a  Possible need for SNF placement upon discharge: not aniticpated  Patient Condition: I have reviewed medical records from [facility], spoken with  TOC , and daughter. I discussed via phone for inpatient rehabilitation assessment.  Patient will benefit from ongoing PT and OT, can actively participate in 3 hours of therapy a day 5 days of the week, and can make measurable gains during the admission.  Patient will also benefit from the coordinated team approach during an Inpatient Acute Rehabilitation admission.  The patient will receive intensive therapy as well as Rehabilitation physician, nursing, social worker, and care management interventions.  Due to safety, skin/wound care, disease management, medication administration, pain management, and patient education the patient requires 24 hour a day rehabilitation nursing.  The patient is currently Max A with mobility and basic ADLs.  Discharge setting and therapy post discharge at home with home health is anticipated.  Patient has agreed to participate in the Acute Inpatient Rehabilitation Program and will admit 05/19/22.  Preadmission Screen Completed By:  Nelly Laurence, 05/12/2022 3:41 PM ______________________________________________________________________   Discussed status with Dr. Naaman Plummer on 05/19/22 at 10:00 am  and received approval for admission today.  Admission Coordinator:  Nelly Laurence, time 10:39 am/Date 05/19/22    Assessment/Plan: Diagnosis:right tib/fib and wrist fx's after fall Does the need for close, 24 hr/day Medical supervision in concert with the patient's rehab needs make it unreasonable for this patient to be served in a less intensive setting? Yes Co-Morbidities requiring supervision/potential complications: pain mgt, Acute renal failure, fever/pneumonia Due to bladder management, bowel management, safety, skin/wound care, disease management, medication administration, pain management, and patient education, does the patient require 24 hr/day rehab nursing? Yes Does the patient require coordinated care of a physician, rehab nurse, PT, OT to address physical and functional deficits in the context of the above medical diagnosis(es)? Yes Addressing deficits in the following areas: balance,  endurance, locomotion, strength, transferring, bowel/bladder control, bathing, dressing, feeding, grooming, toileting, and psychosocial support Can the patient actively participate in an intensive therapy program of at least 3 hrs of therapy 5 days a week? Yes The potential for patient to make measurable gains while on inpatient rehab is excellent Anticipated functional outcomes upon discharge from inpatient rehab: supervision and min assist PT, supervision and min assist OT, n/a SLP Estimated rehab length of stay to reach the above functional goals is: 12-14 days Anticipated discharge destination: Home 10. Overall Rehab/Functional Prognosis: excellent   MD Signature: Meredith Staggers, MD, [place] Director Rehabilitation Services 05/19/2022

## 2022-05-12 NOTE — Progress Notes (Signed)
Subjective: 2 Days Post-Op s/p Procedure(s): OPEN REDUCTION INTERNAL FIXATION (ORIF) TIBIA/FIBULA FRACTURE   Patient is alert, sitting up in bed. States she has some moderate pain at right leg. Denies chest pain, SOB, Calf pain. No nausea/vomiting. No other complaints.     Objective:  PE: VITALS:   Vitals:   05/11/22 0821 05/11/22 1440 05/11/22 2213 05/12/22 0537  BP: (!) 146/66 (!) 148/70 (!) 153/71 (!) 143/75  Pulse: 83 89 70 72  Resp:  _0 Temp:  97.6 F (36.4 C) 98.6 F (37 C) 98.5 F (36.9 C)  TempSrc:  Oral Oral   SpO2:  94% 95% 97%  Weight:      Height:       General: alert, in no acute distress Resp: normal respiratory effort GI: soft, nontender MSK:  RLE: Splint intact. Patient endorses sensation to toes. Able to flex and extend all toes.  RUE: in splint, able to flex and extend all fingers, distal sensation intact, Cap refill intact, Full ROM through elbow  LABS  Results for orders placed or performed during the hospital encounter of 05/08/22 (from the past 24 hour(s))  Glucose, capillary     Status: Abnormal   Collection Time: 05/11/22 12:00 PM  Result Value Ref Range   Glucose-Capillary 222 (H) 70 - 99 mg/dL  Glucose, capillary     Status: Abnormal   Collection Time: 05/11/22  5:03 PM  Result Value Ref Range   Glucose-Capillary 254 (H) 70 - 99 mg/dL  Glucose, capillary     Status: Abnormal   Collection Time: 05/11/22 10:14 PM  Result Value Ref Range   Glucose-Capillary 152 (H) 70 - 99 mg/dL  CBC     Status: Abnormal   Collection Time: 05/12/22  3:27 AM  Result Value Ref Range   WBC 11.4 (H) 4.0 - 10.5 K/uL   RBC 3.35 (L) 3.87 - 5.11 MIL/uL   Hemoglobin 7.9 (L) 12.0 - 15.0 g/dL   HCT 24.5 (L) 36.0 - 46.0 %   MCV 73.1 (L) 80.0 - 100.0 fL   MCH 23.6 (L) 26.0 - 34.0 pg   MCHC 32.2 30.0 - 36.0 g/dL   RDW 15.1 11.5 - 15.5 %   Platelets 109 (L) 150 - 400 K/uL   nRBC 0.0 0.0 - 0.2 %  Comprehensive metabolic panel     Status: Abnormal    Collection Time: 05/12/22  3:27 AM  Result Value Ref Range   Sodium 139 135 - 145 mmol/L   Potassium 4.4 3.5 - 5.1 mmol/L   Chloride 109 98 - 111 mmol/L   CO2 23 22 - 32 mmol/L   Glucose, Bld 137 (H) 70 - 99 mg/dL   BUN 51 (H) 8 - 23 mg/dL   Creatinine, Ser 2.36 (H) 0.44 - 1.00 mg/dL   Calcium 8.3 (L) 8.9 - 10.3 mg/dL   Total Protein 5.6 (L) 6.5 - 8.1 g/dL   Albumin 2.7 (L) 3.5 - 5.0 g/dL   AST 18 15 - 41 U/L   ALT 7 0 - 44 U/L   Alkaline Phosphatase 55 38 - 126 U/L   Total Bilirubin 0.7 0.3 - 1.2 mg/dL   GFR, Estimated 21 (L) >60 mL/min   Anion gap 7 5 - 15  Glucose, capillary     Status: Abnormal   Collection Time: 05/12/22  7:53 AM  Result Value Ref Range   Glucose-Capillary 115 (H) 70 - 99 mg/dL    DG Tibia/Fibula Right Port  Result Date: 05/10/2022 CLINICAL DATA:  Status post ORIF. EXAM: PORTABLE RIGHT TIBIA AND FIBULA - 2 VIEW COMPARISON:  Preoperative imaging. FINDINGS: Intramedullary nail with distal and proximal locking screws traverse proximal tibial fracture. There is also medial plate and screw fixation. Fracture is in improved alignment from preoperative imaging. Minimally displaced proximal fibular fracture also in improved alignment. Overlying cast material limits. Osseous and soft tissue detail. IMPRESSION: 1. ORIF of proximal tibial fracture, in improved alignment from preoperative imaging. 2. Improved alignment of proximal fibular fracture. Electronically Signed   By: Keith Rake M.D.   On: 05/10/2022 14:38   DG Tibia/Fibula Right  Result Date: 05/10/2022 CLINICAL DATA:  Elective surgery. EXAM: RIGHT TIBIA AND FIBULA - 2 VIEW COMPARISON:  Preoperative radiograph. FINDINGS: Eight fluoroscopic spot views of the right tibia and fibula obtained in the operating room. Interval intramedullary nail with distal and proximal locking screw fixation of tibial fracture. Additionally there is a medial plate and screw fixation. Proximal fibular fracture is in improved alignment  from preoperative imaging. Fluoroscopy time 1 minutes 14 seconds. Dose 3.14 mGy. IMPRESSION: Intraoperative fluoroscopy during ORIF of tibial fracture. Electronically Signed   By: Keith Rake M.D.   On: 05/10/2022 12:21   DG C-Arm 1-60 Min-No Report  Result Date: 05/10/2022 Fluoroscopy was utilized by the requesting physician.  No radiographic interpretation.   DG C-Arm 1-60 Min-No Report  Result Date: 05/10/2022 Fluoroscopy was utilized by the requesting physician.  No radiographic interpretation.    Assessment/Plan: Right tibia fracture 2 Days Post-Op s/p Procedure(s): OPEN REDUCTION INTERNAL FIXATION (ORIF) TIBIA/FIBULA FRACTURE  Right distal radius fracture - managing non-operatively with splint  Weightbearing: NWB RLE, NWB right wrist able to WB through right elbow VTE prophylaxis: lovenox x 30 days Pain control: continue current regimen Follow - up plan: 2 weeks with Dr. Mardelle Matte Dispo: patient requesting CIR consult, awaiting news of this consult  Contact information:   [name], PA-C Weekdays 8-5  After hours and holidays please check [email or web address] for group call information for [facility]  [name] 05/12/2022, 8:43 AM

## 2022-05-12 NOTE — Progress Notes (Signed)
Inpatient Rehab Admissions Coordinator:   Received consult for CIR admission. Called patient's daughter to verify 24/7 assistance at home. No answer on phone, unable to leave message. Will continue to try to reach daughter prior to opening with insurance for prior approval.   Rehab Admissons Coordinator [place], [place], [place] [phone]

## 2022-05-12 NOTE — Progress Notes (Signed)
Inpatient Rehab Coordinator Note:  I spoke with daughter by phone to discuss CIR recommendations and goals/expectations of CIR stay.  We reviewed 3 hrs/day of therapy, physician follow up, and average length of stay 2 weeks (dependent upon progress) with goals of min A.  Patient will be living with daughter at discharge and has [place] as well. Opening insurance today for potential admission to CIR. Will continue to follow.   Rehab Admissons Coordinator [place], [place], [place] [phone]

## 2022-05-12 NOTE — Progress Notes (Signed)
  Progress Note   Patient: Miranda Roman WFU:[id number] DOB: 1945/08/29 DOA: 05/08/2022     4 DOS: the patient was seen and examined on 05/12/2022   Brief hospital course: 76 y.o. female with medical history significant of mild neurocognitive disorder, HTN, pulmonary HTN, bronchiectasis, T2DM, CKD4 who presents after a fall.    Pt just moved in to live with daughter and was tripped by 2 dogs today and fell. Did not hit her head or lose consciousness. She normally ambulates with walker but was not doing so at the time. Pt found to have R tib/fib fracture of distal radial metaphysis with mild volar apex angulation and acute nondisplaced fx of distal ulnar metaphysis  Assessment and Plan: * Tibia/fibula fracture, right, closed, initial encounter -Right Tib/Fib X-ray showed acute mildly to moderately comminuted and displaced fracture of the proximal tibial metadiaphysis.  -Acute mildly displaced and mild impacted fx of the proximal fibular metaphysis.  -Ortho was consulted. Pt now s/p surgery 05/10/22 -Cont with analgesia as needed -Therapy recs for acute inpt rehab. CIR consulted per family request, pending    Left wrist pain left wrist X-ray reviewed, neg   Right radial fracture -Right wrist with acute fracture of the distal radial metaphysis with mild volar apex angulation. -Acute nondisplaced fx of the distal ulnar metaphysis.  Hand surgery Dr. Greta Doom consulted. Fractures are non-operable and place on splint. Need to follow up outpatient   Benign essential hypertension Continue home meds as tolerated   Type II diabetes mellitus Cont on sensitive SSI at mealtime. Continue long-acting insulin as tolerated  ARF -Cr peaked to 3.08 -Clinically appears dry with dry membranes, increased thirst -Pt normally follows Dr. Royce Macadamia as outpt -Renal function improved to near baseline with IVF. Voiding well -Pt eating well, per family. Will hold further IVF for now -recheck bmet in AM       Subjective: No complaints this AM  Physical Exam: Vitals:   05/11/22 1440 05/11/22 2213 05/12/22 0537 05/12/22 1330  BP: (!) 148/70 (!) 153/71 (!) 143/75 122/69  Pulse: 89 70 72 75  Resp: _0 Temp: 97.6 F (36.4 C) 98.6 F (37 C) 98.5 F (36.9 C) 98.2 F (36.8 C)  TempSrc: Oral Oral  Oral  SpO2: 94% 95% 97% 100%  Weight:      Height:       General exam: Awake, laying in bed, in nad Respiratory system: Normal respiratory effort, no wheezing Cardiovascular system: regular rate, s1, s2 Gastrointestinal system: Soft, nondistended, positive BS Central nervous system: CN2-12 grossly intact, strength intact Extremities: Perfused, no clubbing Skin: Normal skin turgor, no notable skin lesions seen Psychiatry: Mood normal // no visual hallucinations   Data Reviewed:  Labs reviewed: Na 138, K 4.4, Cr 2.36  Family Communication: Pt in room, family at bedside  Disposition: Status is: Inpatient Remains inpatient appropriate because: Severity of illness  Planned Discharge Destination: Rehab    Author: Marylu Lund, MD 05/12/2022 5:18 PM  For on call review [email or web address].

## 2022-05-13 DIAGNOSIS — S82201A Unspecified fracture of shaft of right tibia, initial encounter for closed fracture: Secondary | ICD-10-CM | POA: Diagnosis not present

## 2022-05-13 DIAGNOSIS — R030 Elevated blood-pressure reading, without diagnosis of hypertension: Secondary | ICD-10-CM | POA: Diagnosis not present

## 2022-05-13 DIAGNOSIS — W010XXA Fall on same level from slipping, tripping and stumbling without subsequent striking against object, initial encounter: Secondary | ICD-10-CM | POA: Diagnosis not present

## 2022-05-13 DIAGNOSIS — S82401A Unspecified fracture of shaft of right fibula, initial encounter for closed fracture: Secondary | ICD-10-CM | POA: Diagnosis not present

## 2022-05-13 LAB — COMPREHENSIVE METABOLIC PANEL
ALT: <5 U/L (ref 0–44)
AST: 18 U/L (ref 15–41)
Albumin: 2.7 g/dL — ABNORMAL LOW (ref 3.5–5.0)
Alkaline Phosphatase: 55 U/L (ref 38–126)
Anion gap: 5 (ref 5–15)
BUN: 48 mg/dL — ABNORMAL HIGH (ref 8–23)
CO2: 24 mmol/L (ref 22–32)
Calcium: 8.1 mg/dL — ABNORMAL LOW (ref 8.9–10.3)
Chloride: 111 mmol/L (ref 98–111)
Creatinine, Ser: 2.64 mg/dL — ABNORMAL HIGH (ref 0.44–1.00)
GFR, Estimated: 18 mL/min — ABNORMAL LOW (ref >60)
Glucose, Bld: 130 mg/dL — ABNORMAL HIGH (ref 70–99)
Potassium: 4.6 mmol/L (ref 3.5–5.1)
Sodium: 140 mmol/L (ref 135–145)
Total Bilirubin: 0.6 mg/dL (ref 0.3–1.2)
Total Protein: 5.7 g/dL — ABNORMAL LOW (ref 6.5–8.1)

## 2022-05-13 LAB — GLUCOSE, CAPILLARY
Glucose-Capillary: 160 mg/dL — ABNORMAL HIGH (ref 70–99)
Glucose-Capillary: 338 mg/dL — ABNORMAL HIGH (ref 70–99)
Glucose-Capillary: 284 mg/dL — ABNORMAL HIGH (ref 70–99)
Glucose-Capillary: 198 mg/dL — ABNORMAL HIGH (ref 70–99)
Glucose-Capillary: 175 mg/dL — ABNORMAL HIGH (ref 70–99)

## 2022-05-13 LAB — CBC
HCT: 25.2 % — ABNORMAL LOW (ref 36.0–46.0)
Hemoglobin: 8.1 g/dL — ABNORMAL LOW (ref 12.0–15.0)
MCH: 23.7 pg — ABNORMAL LOW (ref 26.0–34.0)
MCHC: 32.1 g/dL (ref 30.0–36.0)
MCV: 73.7 fL — ABNORMAL LOW (ref 80.0–100.0)
Platelets: 133 10*3/uL — ABNORMAL LOW (ref 150–400)
RBC: 3.42 MIL/uL — ABNORMAL LOW (ref 3.87–5.11)
RDW: 15.6 % — ABNORMAL HIGH (ref 11.5–15.5)
WBC: 8.5 10*3/uL (ref 4.0–10.5)
nRBC: 0 % (ref 0.0–0.2)

## 2022-05-13 MED ORDER — LORATADINE 10 MG PO TABS
10.0000 mg | ORAL_TABLET | Freq: Every day | ORAL | Status: DC
  Administered 2022-05-14 – 2022-05-18 (×5): 10 mg via ORAL
  Filled 2022-05-13 (×5): qty 1

## 2022-05-13 NOTE — Care Management Important Message (Signed)
Important Message  Patient Details IM Letter given to the Patient Name: Miranda Roman MRN: [id number] Date of Birth: [birth year]   Medicare Important Message Given:  Yes     Kerin Salen 05/13/2022, 11:58 AM

## 2022-05-13 NOTE — Progress Notes (Signed)
Subjective: 3 Days Post-Op s/p Procedure(s): OPEN REDUCTION INTERNAL FIXATION (ORIF) TIBIA/FIBULA FRACTURE   Patient is alert, sitting up in bed. States pain is minimal. No other complaints.     Objective:  PE: VITALS:   Vitals:   05/12/22 0537 05/12/22 1330 05/12/22 2039 05/13/22 0445  BP: (!) 143/75 122/69 (!) 149/73 (!) 156/73  Pulse: 72 75 91 85  Resp: _0 Temp: 98.5 F (36.9 C) 98.2 F (36.8 C) 98.6 F (37 C) 99.5 F (37.5 C)  TempSrc:  Oral Oral Oral  SpO2: 97% 100% 100% 95%  Weight:      Height:       General: alert, in no acute distress Resp: normal respiratory effort GI: soft, nontender MSK:  RLE: Splint intact. Patient endorses sensation to toes. Able to flex and extend all toes.  RUE: in splint, though this looks to be slipping off, able to flex and extend all fingers, distal sensation intact, Cap refill intact, Full ROM through elbow  LABS  Results for orders placed or performed during the hospital encounter of 05/08/22 (from the past 24 hour(s))  Glucose, capillary     Status: Abnormal   Collection Time: 05/12/22  7:53 AM  Result Value Ref Range   Glucose-Capillary 115 (H) 70 - 99 mg/dL  Glucose, capillary     Status: Abnormal   Collection Time: 05/12/22 12:17 PM  Result Value Ref Range   Glucose-Capillary 163 (H) 70 - 99 mg/dL  Glucose, capillary     Status: Abnormal   Collection Time: 05/12/22  5:27 PM  Result Value Ref Range   Glucose-Capillary 221 (H) 70 - 99 mg/dL  Glucose, capillary     Status: Abnormal   Collection Time: 05/12/22  8:46 PM  Result Value Ref Range   Glucose-Capillary 294 (H) 70 - 99 mg/dL  Comprehensive metabolic panel     Status: Abnormal   Collection Time: 05/13/22  3:42 AM  Result Value Ref Range   Sodium 140 135 - 145 mmol/L   Potassium 4.6 3.5 - 5.1 mmol/L   Chloride 111 98 - 111 mmol/L   CO2 24 22 - 32 mmol/L   Glucose, Bld 130 (H) 70 - 99 mg/dL   BUN 48 (H) 8 - 23 mg/dL   Creatinine, Ser 2.64 (H)  0.44 - 1.00 mg/dL   Calcium 8.1 (L) 8.9 - 10.3 mg/dL   Total Protein 5.7 (L) 6.5 - 8.1 g/dL   Albumin 2.7 (L) 3.5 - 5.0 g/dL   AST 18 15 - 41 U/L   ALT <5 0 - 44 U/L   Alkaline Phosphatase 55 38 - 126 U/L   Total Bilirubin 0.6 0.3 - 1.2 mg/dL   GFR, Estimated 18 (L) >60 mL/min   Anion gap 5 5 - 15  CBC     Status: Abnormal   Collection Time: 05/13/22  3:42 AM  Result Value Ref Range   WBC 8.5 4.0 - 10.5 K/uL   RBC 3.42 (L) 3.87 - 5.11 MIL/uL   Hemoglobin 8.1 (L) 12.0 - 15.0 g/dL   HCT 25.2 (L) 36.0 - 46.0 %   MCV 73.7 (L) 80.0 - 100.0 fL   MCH 23.7 (L) 26.0 - 34.0 pg   MCHC 32.1 30.0 - 36.0 g/dL   RDW 15.6 (H) 11.5 - 15.5 %   Platelets 133 (L) 150 - 400 K/uL   nRBC 0.0 0.0 - 0.2 %    No results found.  Assessment/Plan: Right tibia  fracture 3 Days Post-Op s/p Procedure(s): OPEN REDUCTION INTERNAL FIXATION (ORIF) TIBIA/FIBULA FRACTURE  Right distal radius fracture - managing non-operatively with splint - will send order to [employer] for new volar splint  Weightbearing: NWB RLE, NWB right wrist able to WB through right elbow, up with therapy as able VTE prophylaxis: lovenox x 30 days Pain control: continue current regimen Follow - up plan: 2 weeks with Dr. Mardelle Matte Dispo: patient requesting CIR consult, awaiting news of this consult  Contact information:   [name], PA-C Weekdays 8-5  After hours and holidays please check [email or web address] for group call information for [facility]  [name] 05/13/2022, 7:08 AM

## 2022-05-13 NOTE — Progress Notes (Signed)
Physical Therapy Treatment Patient Details Name: Miranda Roman MRN: [id number] DOB: 05-25-1946 Today's Date: 05/13/2022   History of Present Illness 76 yo female admitted with R proximal tibia/fibula fx, R distal radius/ulna fx. S/P IM nailing R tibia 11/4. Non-operative management of R wrist fx-splinted. Hx of mild neurocognitive d/o, osteoporosis, pulm HTN, DM, CKD.    PT Comments    POD # 3  General Comments: AxO x 3 very pleasant Lady with mild fear of falling as well as fear of more pain.   requires MAX encouragement. Pt pre-medicated.  Still tends to grimace/cry out with even the slighttest movement.   Assisted OOB was difficult and required + 2 assist.  General bed mobility comments: 75% VC's on proper tech, increased time, Educated on use of belt to self guide R LE but over all difficult due to R UE in splint @ WBing thru elbow only.  Utilized bed pad to complete scooting.  Mild c/o dizziness EOB which decreased with increased rest period. General transfer comment: attempted sit to stand twice requiring + 2 MAX side by side Assist from elevated bed.  Pt unable to fully support her weight through R LE and present with B UE weakness even despite using R Platform.  Unable to support self.  Performed a partial 1/4 pivot towards her LEFT to recliner.  Rec Maxi Move back to bed. General Gait Details: attempted forward gait hoever pt was unable to fully support self upright and unable to advance either LE. Family is hoping for Inpt Rehab.   Recommendations for follow up therapy are one component of a multi-disciplinary discharge planning process, led by the attending physician.  Recommendations may be updated based on patient status, additional functional criteria and insurance authorization.  Follow Up Recommendations  Acute inpatient rehab (3hours/day)     Assistance Recommended at Discharge Frequent or constant Supervision/Assistance  Patient can return home with the following Two  people to help with walking and/or transfers;Two people to help with bathing/dressing/bathroom;Assistance with cooking/housework;Assist for transportation;Help with stairs or ramp for entrance   Equipment Recommendations       Recommendations for Other Services Rehab consult     Precautions / Restrictions Precautions Precautions: Fall Precaution Comments: R knee pain - hold leg up Restrictions Weight Bearing Restrictions: Yes RUE Weight Bearing: Non weight bearing RLE Weight Bearing: Non weight bearing Other Position/Activity Restrictions: Can bear weight through right elbow, not wrist     Mobility  Bed Mobility Overal bed mobility: Needs Assistance Bed Mobility: Supine to Sit     Supine to sit: Max assist, +2 for physical assistance, +2 for safety/equipment     General bed mobility comments: 75% VC's on proper tech, increased time, Educated on use of belt to self guide R LE but over all difficult due to R UE in splint @ WBing thru elbow only.  Utilized bed pad to complete scooting.  Mild c/o dizziness EOB which decreased with increased rest period.    Transfers Overall transfer level: Needs assistance Equipment used: Right platform walker Transfers: Sit to/from Stand Sit to Stand: Total assist, +2 physical assistance, +2 safety/equipment, From elevated surface           General transfer comment: attempted sit to stand twice requiring + 2 MAX side by side Assist from elevated bed.  Pt unable to fully support her weight through R LE and present with B UE weakness even despite using R Platform.  Unable to support self.  Performed a partial 1/4  pivot towards her LEFT to recliner.  Rec Maxi Move back to bed.    Ambulation/Gait               General Gait Details: attempted forward gait hoever pt was unable to fully support self upright and unable to advance either LE.   Stairs             Wheelchair Mobility    Modified Rankin (Stroke Patients Only)        Balance                                            Cognition Arousal/Alertness: Awake/alert Behavior During Therapy: WFL for tasks assessed/performed Overall Cognitive Status: Within Functional Limits for tasks assessed                                 General Comments: AxO x 3 very pleasant Lady with mild fear of falling as well as fear of more pain.   requires MAX encouragement.        Exercises      General Comments        Pertinent Vitals/Pain Pain Assessment Pain Assessment: 0-10 Pain Score: 10-Worst pain ever Pain Location: R knee with activity/movement Pain Descriptors / Indicators: Guarding, Grimacing, Moaning, Crying Pain Intervention(s): Monitored during session, Premedicated before session, Repositioned, Ice applied    Home Living                          Prior Function            PT Goals (current goals can now be found in the care plan section) Progress towards PT goals: Progressing toward goals    Frequency    Min 3X/week      PT Plan Current plan remains appropriate    Co-evaluation              AM-PAC PT "6 Clicks" Mobility   Outcome Measure  Help needed turning from your back to your side while in a flat bed without using bedrails?: A Lot Help needed moving from lying on your back to sitting on the side of a flat bed without using bedrails?: A Lot Help needed moving to and from a bed to a chair (including a wheelchair)?: A Lot Help needed standing up from a chair using your arms (e.g., wheelchair or bedside chair)?: A Lot Help needed to walk in hospital room?: Total Help needed climbing 3-5 steps with a railing? : Total 6 Click Score: 10    End of Session Equipment Utilized During Treatment: Gait belt Activity Tolerance: Patient limited by fatigue;Patient limited by pain Patient left: in chair;with call bell/phone within reach;with family/visitor present Nurse Communication:  Mobility status PT Visit Diagnosis: History of falling (Z91.81);Pain;Muscle weakness (generalized) (M62.81);Other abnormalities of gait and mobility (R26.89) Pain - Right/Left: Right Pain - part of body: Knee     Time: 0865-7846 PT Time Calculation (min) (ACUTE ONLY): 27 min  Charges:  $Therapeutic Activity: 23-37 mins                     Rica Koyanagi  PTA Acute  Rehabilitation Services Office M-F          [phone] Weekend pager [phone]

## 2022-05-13 NOTE — Progress Notes (Signed)
  Progress Note   Patient: Miranda Roman OHY:[id number] DOB: [birth year] DOA: 05/08/2022     5 DOS: the patient was seen and examined on 05/13/2022   Brief hospital course: 76 y.o. female with medical history significant of mild neurocognitive disorder, HTN, pulmonary HTN, bronchiectasis, T2DM, CKD4 who presents after a fall.    Pt just moved in to live with daughter and was tripped by 2 dogs today and fell. Did not hit her head or lose consciousness. She normally ambulates with walker but was not doing so at the time. Pt found to have R tib/fib fracture of distal radial metaphysis with mild volar apex angulation and acute nondisplaced fx of distal ulnar metaphysis  Assessment and Plan: * Tibia/fibula fracture, right, closed, initial encounter -Right Tib/Fib X-ray showed acute mildly to moderately comminuted and displaced fracture of the proximal tibial metadiaphysis.  -Acute mildly displaced and mild impacted fx of the proximal fibular metaphysis.  -Ortho was consulted. Pt now s/p surgery 05/10/22 -Cont with analgesia as needed -Therapy recs for acute inpt rehab. CIR consulted per family request, awaiting insurance authorization    Left wrist pain left wrist X-ray reviewed, neg   Right radial fracture -Right wrist with acute fracture of the distal radial metaphysis with mild volar apex angulation. -Acute nondisplaced fx of the distal ulnar metaphysis.  Hand surgery Dr. Greta Doom consulted. Fractures are non-operable and place on splint. Need to follow up outpatient   Benign essential hypertension Continue home meds as tolerated   Type II diabetes mellitus Cont on sensitive SSI at mealtime. Continue long-acting insulin as tolerated -glycemic trends stable  ARF -Cr peaked to 3.08, baseline Cr around 2.3-2.5 -Clinically appears dry with dry membranes, increased thirst -Pt normally follows Dr. Royce Macadamia as outpt -Renal function improved to near baseline with IVF. Voiding well -Pt eating  well, per family. Now off IVF  -recheck bmet in AM      Subjective: Complains of some tingling in fingers of R hand  Physical Exam: Vitals:   05/12/22 1330 05/12/22 2039 05/13/22 0445 05/13/22 1322  BP: 122/69 (!) 149/73 (!) 156/73 (!) 152/76  Pulse: 75 91 85 79  Resp: _0 Temp: 98.2 F (36.8 C) 98.6 F (37 C) 99.5 F (37.5 C) 98.1 F (36.7 C)  TempSrc: Oral Oral Oral   SpO2: 100% 100% 95% 100%  Weight:      Height:       General exam: Conversant, in no acute distress Respiratory system: normal chest rise, clear, no audible wheezing Cardiovascular system: regular rhythm, s1-s2 Gastrointestinal system: Nondistended, nontender, pos BS Central nervous system: No seizures, no tremors Extremities: No cyanosis, no joint deformities Skin: No rashes, no pallor Psychiatry: Affect normal // no auditory hallucinations   Data Reviewed:  Labs reviewed: Na 140, K 4.6, Cr 2.64  Family Communication: Pt in room, family at bedside  Disposition: Status is: Inpatient Remains inpatient appropriate because: Severity of illness  Planned Discharge Destination: Rehab    Author: Marylu Lund, MD 05/13/2022 2:22 PM  For on call review [email or web address].

## 2022-05-13 NOTE — Progress Notes (Signed)
Physical Therapy Treatment Patient Details Name: Miranda Roman MRN: [id number] DOB: 02-25-1946 Today's Date: 05/13/2022   History of Present Illness 76 yo female admitted with R proximal tibia/fibula fx, R distal radius/ulna fx. S/P IM nailing R tibia 11/4. Non-operative management of R wrist fx-splinted. Hx of mild neurocognitive d/o, osteoporosis, pulm HTN, DM, CKD.    PT Comments    Called to room to assist pt back to this afternoon.  Pt mostly sleepy still in recliner.  Had to use Maxi Move LIFT to assist back to bed.  Required + 2 assist and lateral approach as well as constant support R LE during transition.  General bed mobility comments: side to side rolling + 2 Max/Total Asisst to remove Maxi Move Pad and hygiene.  Positioned to comfort plus elevated R UE and R LE. Pt awaiting Inpt Rehab evaluation.   Recommendations for follow up therapy are one component of a multi-disciplinary discharge planning process, led by the attending physician.  Recommendations may be updated based on patient status, additional functional criteria and insurance authorization.  Follow Up Recommendations  Acute inpatient rehab (3hours/day)     Assistance Recommended at Discharge Frequent or constant Supervision/Assistance  Patient can return home with the following Two people to help with walking and/or transfers;Two people to help with bathing/dressing/bathroom;Assistance with cooking/housework;Assist for transportation;Help with stairs or ramp for entrance   Equipment Recommendations       Recommendations for Other Services Rehab consult     Precautions / Restrictions Precautions Precautions: Fall Precaution Comments: R knee pain - hold leg up Required Braces or Orthoses: Splint/Cast Splint/Cast: R UE + R LE Restrictions Weight Bearing Restrictions: Yes RUE Weight Bearing: Non weight bearing RLE Weight Bearing: Non weight bearing Other Position/Activity Restrictions: Can bear weight  through right elbow, not wrist     Mobility  Bed Mobility Overal bed mobility: Needs Assistance Bed Mobility: Rolling Rolling: +2 for physical assistance, +2 for safety/equipment, Max assist, Total assist   Supine to sit: Max assist, +2 for physical assistance, +2 for safety/equipment     General bed mobility comments: side to side rolling + 2 Max/Total Asisst to remove Maxi Move Pad and hygiene.  Positioned to comfort plus elevated R UE and R LE.    Transfers         General transfer comment: Used Maxi Move Lift to transfer pt from recliner back to bed lateral approach. Transfer via Lift Equipment: [place]  Ambulation/Gait               General Gait Details: currently Non Amb   Marine scientist Rankin (Stroke Patients Only)       Balance                                            Cognition Arousal/Alertness: Awake/alert Behavior During Therapy: WFL for tasks assessed/performed Overall Cognitive Status: Within Functional Limits for tasks assessed Area of Impairment: Problem solving                             Problem Solving: Difficulty sequencing, Requires verbal cues, Requires tactile cues, Slow processing General Comments: AxO x 3 very pleasant Lady with mild fear of falling as well as fear of more  pain.   requires MAX encouragement.  VERY sleepy today.        Exercises      General Comments        Pertinent Vitals/Pain Pain Assessment Pain Assessment: Faces Pain Score: 10-Worst pain ever Faces Pain Scale: Hurts even more Pain Location: R knee with activity/movement Pain Descriptors / Indicators: Guarding, Grimacing, Moaning, Aching Pain Intervention(s): Monitored during session, Premedicated before session, Repositioned    Home Living                          Prior Function            PT Goals (current goals can now be found in the care plan section)  Progress towards PT goals: Progressing toward goals    Frequency    Min 3X/week      PT Plan Current plan remains appropriate    Co-evaluation              AM-PAC PT "6 Clicks" Mobility   Outcome Measure  Help needed turning from your back to your side while in a flat bed without using bedrails?: Total Help needed moving from lying on your back to sitting on the side of a flat bed without using bedrails?: Total Help needed moving to and from a bed to a chair (including a wheelchair)?: Total Help needed standing up from a chair using your arms (e.g., wheelchair or bedside chair)?: Total Help needed to walk in hospital room?: Total Help needed climbing 3-5 steps with a railing? : Total 6 Click Score: 6    End of Session Equipment Utilized During Treatment: Gait belt Activity Tolerance: Patient tolerated treatment well Patient left: in bed;with call bell/phone within reach;with bed alarm set;with family/visitor present Nurse Communication: Mobility status PT Visit Diagnosis: History of falling (Z91.81);Pain;Muscle weakness (generalized) (M62.81);Other abnormalities of gait and mobility (R26.89) Pain - Right/Left: Right Pain - part of body: Knee     Time: 1740-8144 PT Time Calculation (min) (ACUTE ONLY): 15 min  Charges:  $Therapeutic Activity: 8-22 mins                    Rica Koyanagi  PTA Acute  Rehabilitation Services Office M-F          [phone] Weekend pager [phone]

## 2022-05-13 NOTE — Progress Notes (Signed)
Inpatient Rehab Admissions Coordinator:   Updated daughter, [name] by phone that we are still waiting for decision from [employer]. Will continue to follow. Best number to reach [name] is [phone].  Rehab Admissons Coordinator [place], [place], [place] [phone]

## 2022-05-13 NOTE — Progress Notes (Signed)
Orthopedic Tech Progress Note Patient Details:  Miranda Roman 26-Nov-1945 [id number]  Patient ID: Miranda Roman, female   DOB: [birth year], 76 y.o.   MRN: [id number]  Miranda Roman 05/13/2022, 10:30 AM Volar reapplied to right arm

## 2022-05-14 DIAGNOSIS — S82201A Unspecified fracture of shaft of right tibia, initial encounter for closed fracture: Secondary | ICD-10-CM | POA: Diagnosis not present

## 2022-05-14 DIAGNOSIS — S52501K Unspecified fracture of the lower end of right radius, subsequent encounter for closed fracture with nonunion: Secondary | ICD-10-CM | POA: Diagnosis not present

## 2022-05-14 DIAGNOSIS — N179 Acute kidney failure, unspecified: Secondary | ICD-10-CM | POA: Diagnosis not present

## 2022-05-14 DIAGNOSIS — I10 Essential (primary) hypertension: Secondary | ICD-10-CM

## 2022-05-14 DIAGNOSIS — N184 Chronic kidney disease, stage 4 (severe): Secondary | ICD-10-CM

## 2022-05-14 LAB — COMPREHENSIVE METABOLIC PANEL
ALT: 6 U/L (ref 0–44)
AST: 18 U/L (ref 15–41)
Albumin: 2.7 g/dL — ABNORMAL LOW (ref 3.5–5.0)
Alkaline Phosphatase: 53 U/L (ref 38–126)
Anion gap: 8 (ref 5–15)
BUN: 48 mg/dL — ABNORMAL HIGH (ref 8–23)
CO2: 22 mmol/L (ref 22–32)
Calcium: 8.2 mg/dL — ABNORMAL LOW (ref 8.9–10.3)
Chloride: 111 mmol/L (ref 98–111)
Creatinine, Ser: 2.87 mg/dL — ABNORMAL HIGH (ref 0.44–1.00)
GFR, Estimated: 17 mL/min — ABNORMAL LOW (ref >60)
Glucose, Bld: 165 mg/dL — ABNORMAL HIGH (ref 70–99)
Potassium: 4.4 mmol/L (ref 3.5–5.1)
Sodium: 141 mmol/L (ref 135–145)
Total Bilirubin: 0.5 mg/dL (ref 0.3–1.2)
Total Protein: 5.6 g/dL — ABNORMAL LOW (ref 6.5–8.1)

## 2022-05-14 LAB — CBC
HCT: 24.5 % — ABNORMAL LOW (ref 36.0–46.0)
Hemoglobin: 8.1 g/dL — ABNORMAL LOW (ref 12.0–15.0)
MCH: 23.8 pg — ABNORMAL LOW (ref 26.0–34.0)
MCHC: 33.1 g/dL (ref 30.0–36.0)
MCV: 72.1 fL — ABNORMAL LOW (ref 80.0–100.0)
Platelets: 147 10*3/uL — ABNORMAL LOW (ref 150–400)
RBC: 3.4 MIL/uL — ABNORMAL LOW (ref 3.87–5.11)
RDW: 15.5 % (ref 11.5–15.5)
WBC: 9.5 10*3/uL (ref 4.0–10.5)
nRBC: 0 % (ref 0.0–0.2)

## 2022-05-14 LAB — GLUCOSE, CAPILLARY
Glucose-Capillary: 184 mg/dL — ABNORMAL HIGH (ref 70–99)
Glucose-Capillary: 305 mg/dL — ABNORMAL HIGH (ref 70–99)
Glucose-Capillary: 179 mg/dL — ABNORMAL HIGH (ref 70–99)
Glucose-Capillary: 238 mg/dL — ABNORMAL HIGH (ref 70–99)

## 2022-05-14 MED ORDER — SODIUM CHLORIDE 0.45 % IV SOLN: INTRAVENOUS | Status: AC

## 2022-05-14 NOTE — Progress Notes (Addendum)
Subjective: 4 Days Post-Op s/p Procedure(s): OPEN REDUCTION INTERNAL FIXATION (ORIF) TIBIA/FIBULA FRACTURE   Patient is alert, sitting up in bed. States pain is minimal. No other complaints.     Objective:  PE: VITALS:   Vitals:   05/13/22 2207 05/14/22 0159 05/14/22 0445 05/14/22 0737  BP: 137/78 (!) 181/81 (!) 170/76 (!) 149/81  Pulse: 92 88 94 95  Resp: _0 Temp: 98.5 F (36.9 C) 98.7 F (37.1 C) (!) 100.6 F (38.1 C) 99.3 F (37.4 C)  TempSrc: Oral Oral Oral   SpO2: 97% 99% 100% 97%  Weight:      Height:       General: alert, in no acute distress Resp: normal respiratory effort GI: soft, nontender MSK:  RLE: Splint intact. Patient endorses sensation to toes. Able to flex and extend all toes.  RUE: in splint, though this looks to be slipping off, able to flex and extend all fingers, distal sensation intact, Cap refill intact, Full ROM through elbow  LABS  Results for orders placed or performed during the hospital encounter of 05/08/22 (from the past 24 hour(s))  Glucose, capillary     Status: Abnormal   Collection Time: 05/13/22 11:43 AM  Result Value Ref Range   Glucose-Capillary 338 (H) 70 - 99 mg/dL  Glucose, capillary     Status: Abnormal   Collection Time: 05/13/22  1:18 PM  Result Value Ref Range   Glucose-Capillary 284 (H) 70 - 99 mg/dL  Glucose, capillary     Status: Abnormal   Collection Time: 05/13/22  7:09 PM  Result Value Ref Range   Glucose-Capillary 198 (H) 70 - 99 mg/dL  Glucose, capillary     Status: Abnormal   Collection Time: 05/13/22 10:14 PM  Result Value Ref Range   Glucose-Capillary 175 (H) 70 - 99 mg/dL  Comprehensive metabolic panel     Status: Abnormal   Collection Time: 05/14/22  3:43 AM  Result Value Ref Range   Sodium 141 135 - 145 mmol/L   Potassium 4.4 3.5 - 5.1 mmol/L   Chloride 111 98 - 111 mmol/L   CO2 22 22 - 32 mmol/L   Glucose, Bld 165 (H) 70 - 99 mg/dL   BUN 48 (H) 8 - 23 mg/dL   Creatinine, Ser 2.87  (H) 0.44 - 1.00 mg/dL   Calcium 8.2 (L) 8.9 - 10.3 mg/dL   Total Protein 5.6 (L) 6.5 - 8.1 g/dL   Albumin 2.7 (L) 3.5 - 5.0 g/dL   AST 18 15 - 41 U/L   ALT 6 0 - 44 U/L   Alkaline Phosphatase 53 38 - 126 U/L   Total Bilirubin 0.5 0.3 - 1.2 mg/dL   GFR, Estimated 17 (L) >60 mL/min   Anion gap 8 5 - 15  CBC     Status: Abnormal   Collection Time: 05/14/22  7:19 AM  Result Value Ref Range   WBC 9.5 4.0 - 10.5 K/uL   RBC 3.40 (L) 3.87 - 5.11 MIL/uL   Hemoglobin 8.1 (L) 12.0 - 15.0 g/dL   HCT 24.5 (L) 36.0 - 46.0 %   MCV 72.1 (L) 80.0 - 100.0 fL   MCH 23.8 (L) 26.0 - 34.0 pg   MCHC 33.1 30.0 - 36.0 g/dL   RDW 15.5 11.5 - 15.5 %   Platelets 147 (L) 150 - 400 K/uL   nRBC 0.0 0.0 - 0.2 %  Glucose, capillary     Status: Abnormal  Collection Time: 05/14/22  8:08 AM  Result Value Ref Range   Glucose-Capillary 184 (H) 70 - 99 mg/dL    No results found.  Assessment/Plan: Right tibia fracture 4 Days Post-Op s/p Procedure(s): OPEN REDUCTION INTERNAL FIXATION (ORIF) TIBIA/FIBULA FRACTURE  Right distal radius fracture - managing non-operatively with splint - better fitting volar splint in place  Weightbearing: NWB RLE, NWB right wrist able to WB through right elbow, up with therapy as able VTE prophylaxis: lovenox x 30 days Pain control: continue current regimen Follow - up plan: 2 weeks with Dr. Mardelle Matte Dispo: patient requesting CIR consult, awaiting news of this consult. Okay to discharge from ortho standpoint when decision reached.  Contact information:   [name], Hershal Coria [id number] 8-5  After hours and holidays please check [email or web address] for group call information for [facility]  [name] 05/14/2022, 9:09 AM

## 2022-05-14 NOTE — Progress Notes (Signed)
Occupational Therapy Treatment Patient Details Name: WITNEY HUIE MRN: [id number] DOB: 07/12/1945 Today's Date: 05/14/2022   History of present illness 76 yo female admitted with R proximal tibia/fibula fx, R distal radius/ulna fx. S/P IM nailing R tibia 11/4. Non-operative management of R wrist fx-splinted. Hx of mild neurocognitive d/o, osteoporosis, pulm HTN, DM, CKD.   OT comments  Treatment limited by patient's difficulty motor planning and her minimal physical assistance. Patient found in chair and complaining of buttock pain and wanting to get back to bed. Patient continues to exhibit slow processing and cognitive impairment though she is alert and oriented. Despite two attempts to stand from chair patient ultimately required hoyer to return to bed. Patient yelping/screaming in pain due to right knee pain and pain in buttocks. Patient is slow to move her extremities to prepare or attempt movement and when movement is attempted there is minimal physical effort. Will continue POC.   Recommendations for follow up therapy are one component of a multi-disciplinary discharge planning process, led by the attending physician.  Recommendations may be updated based on patient status, additional functional criteria and insurance authorization.    Follow Up Recommendations  Acute inpatient rehab (3hours/day)    Assistance Recommended at Discharge Frequent or constant Supervision/Assistance  Patient can return home with the following  Two people to help with walking and/or transfers;Assistance with cooking/housework;Direct supervision/assist for medications management;Assist for transportation;Help with stairs or ramp for entrance;Direct supervision/assist for financial management;Two people to help with bathing/dressing/bathroom   Equipment Recommendations  Other (comment) (TBD)    Recommendations for Other Services      Precautions / Restrictions Precautions Precautions: Fall Precaution  Comments: R knee pain - hold leg up Required Braces or Orthoses: Splint/Cast Splint/Cast: R UE + R LE Restrictions Weight Bearing Restrictions: Yes RUE Weight Bearing: Non weight bearing RLE Weight Bearing: Non weight bearing Other Position/Activity Restrictions: Can bear weight through right elbow, not wrist       Mobility Bed Mobility                    Transfers                   General transfer comment: Attempted sit to stand from chair x 2 but despite +2 physical assistance could not get patient into standing. Patient exhibiting minimal effort. Patient screaming with pain in buttocks/back and right knee with movement. Inevitably had to use the hoyer to get patient back in bed. Transfer via Lift Equipment: [employer]                                           ADL either performed or assessed with clinical judgement   ADL                                              Extremity/Trunk Assessment Upper Extremity Assessment RUE Deficits / Details: Splint in place from palm throughout forearm, functional shoulder ROM and strength LUE Deficits / Details: WFL ROM and strength LUE Sensation: WNL LUE Coordination: WNL   Lower Extremity Assessment Lower Extremity Assessment: Defer to PT evaluation   Cervical / Trunk Assessment Cervical / Trunk Assessment: Normal    Vision   Vision Assessment?: No  apparent visual deficits   Perception     Praxis      Cognition Arousal/Alertness: Awake/alert Behavior During Therapy: WFL for tasks assessed/performed Overall Cognitive Status: Within Functional Limits for tasks assessed Area of Impairment: Problem solving                               General Comments: Alert and oriented but exhibited slow processing and dificulty motor planning.        Exercises      Shoulder Instructions       General Comments      Pertinent Vitals/ Pain       Pain  Assessment Pain Assessment: Faces Faces Pain Scale: Hurts whole lot Pain Location: R knee with activity/movement PLUS TODAY bUTTOCKS/back (NEW) Pain Descriptors / Indicators: Guarding, Grimacing, Moaning, Aching Pain Intervention(s): Monitored during session, Repositioned  Home Living                                          Prior Functioning/Environment              Frequency  Min 2X/week        Progress Toward Goals  OT Goals(current goals can now be found in the care plan section)  Progress towards OT goals: OT to reassess next treatment  Acute Rehab OT Goals Patient Stated Goal: get out of chair OT Goal Formulation: With patient Time For Goal Achievement: 05/25/22 Potential to Achieve Goals: [place] Discharge plan remains appropriate    Co-evaluation                 AM-PAC OT "6 Clicks" Daily Activity     Outcome Measure   Help from another person eating meals?: A Little Help from another person taking care of personal grooming?: A Little Help from another person toileting, which includes using toliet, bedpan, or urinal?: Total Help from another person bathing (including washing, rinsing, drying)?: A Lot Help from another person to put on and taking off regular upper body clothing?: A Lot Help from another person to put on and taking off regular lower body clothing?: Total 6 Click Score: 12    End of Session Equipment Utilized During Treatment: Other (comment) (hoyer)  OT Visit Diagnosis: Other abnormalities of gait and mobility (R26.89);Pain   Activity Tolerance Patient limited by pain   Patient Left in bed;with call bell/phone within reach;with bed alarm set   Nurse Communication Mobility status        Time: 8592-9244 OT Time Calculation (min): 21 min  Charges: OT General Charges $OT Visit: 1 Visit OT Treatments $Therapeutic Activity: 8-22 mins  Gustavo Lah, OTR/L [place]  Office  [phone]   Lenward Chancellor 05/14/2022, 4:23 PM

## 2022-05-14 NOTE — Progress Notes (Signed)
Physical Therapy Treatment Patient Details Name: Miranda Roman MRN: [id number] DOB: 06/18/1946 Today's Date: 05/14/2022   History of Present Illness 76 yo female admitted with R proximal tibia/fibula fx, R distal radius/ulna fx. S/P IM nailing R tibia 11/4. Non-operative management of R wrist fx-splinted. Hx of mild neurocognitive d/o, osteoporosis, pulm HTN, DM, CKD.    PT Comments    General Comments: AxO x 3 very pleasant Lady with mild fear of falling as well as fear of more pain.   requires MAX encouragement.  Not putting forth full effort.  Self limiting. Pt progressing VERY slowly.  General bed mobility comments: Pt required Max/Total Assist to transition to EOB with increased time.  Attempted to demonstrate how to use belt to self assist LE however pt offers little self effort.  Utilized bed pad to complete scooting was difficu;t. General transfer comment: First performed x 3 sit to stand in attempt to off load buttocks off bed and practice NWBing R LE.  Pt only able to partially perform and required + 2 assist.  Assisted to recliner was difficult as pt was unable to support herself, unable to pivot completely and unable to control to sit.  Required + 2 Total Asisst to scoot to back of recliner along with full support to hold R LE elevated as pt is unable to tolerate knee ROM (pain).  Pt awaiting Insurance decision on Int Pt Rehab.   Recommendations for follow up therapy are one component of a multi-disciplinary discharge planning process, led by the attending physician.  Recommendations may be updated based on patient status, additional functional criteria and insurance authorization.  Follow Up Recommendations  Acute inpatient rehab (3hours/day)     Assistance Recommended at Discharge Frequent or constant Supervision/Assistance  Patient can return home with the following Two people to help with walking and/or transfers;Two people to help with bathing/dressing/bathroom;Assistance  with cooking/housework;Assist for transportation;Help with stairs or ramp for entrance   Equipment Recommendations       Recommendations for Other Services Rehab consult     Precautions / Restrictions Precautions Precautions: Fall Precaution Comments: R knee pain - hold leg up Splint/Cast: R UE + R LE Restrictions Weight Bearing Restrictions: Yes RUE Weight Bearing: Non weight bearing RLE Weight Bearing: Non weight bearing Other Position/Activity Restrictions: Can bear weight through right elbow, not wrist     Mobility  Bed Mobility Overal bed mobility: Needs Assistance       Supine to sit: Max assist, +2 for physical assistance, +2 for safety/equipment     General bed mobility comments: Pt required Max/Total Assist to transition to EOB with increased time.  Attempted to demonstrate how to use belt to self assist LE however pt offers little self effort.  Utilized bed pad to complete scooting was difficu;t.    Transfers Overall transfer level: Needs assistance Equipment used: Right platform walker Transfers: Sit to/from Stand, Bed to chair/wheelchair/BSC Sit to Stand: Total assist, +2 physical assistance, +2 safety/equipment, From elevated surface           General transfer comment: First performed x 3 sit to stand in attempt to off load buttocks off bed and practice NWBing R LE.  Pt only able to partially perform and required + 2 assist.  Assisted to recliner was difficult as pt was unable to support herself, unable to pivot completely and unable to control to sit.  Required + 2 Total Asisst to scoot to back of recliner along with full support to hold R LE  elevated as pt is unable to tolerate knee ROM (pain).    Ambulation/Gait               General Gait Details: currently Non Amb   Stairs             Wheelchair Mobility    Modified Rankin (Stroke Patients Only)       Balance                                             Cognition Arousal/Alertness: Awake/alert Behavior During Therapy: WFL for tasks assessed/performed Overall Cognitive Status: Within Functional Limits for tasks assessed Area of Impairment: Problem solving                               General Comments: AxO x 3 very pleasant Lady with mild fear of falling as well as fear of more pain.   requires MAX encouragement.  Not putting forth full effort.  Self limiting.        Exercises      General Comments        Pertinent Vitals/Pain Pain Assessment Pain Assessment: Faces Faces Pain Scale: Hurts even more Pain Location: R knee with activity/movement PLUS TODAY bUTTOCKS (NEW) Pain Descriptors / Indicators: Guarding, Grimacing, Moaning, Aching Pain Intervention(s): Monitored during session, Premedicated before session, Repositioned    Home Living                          Prior Function            PT Goals (current goals can now be found in the care plan section) Progress towards PT goals: Progressing toward goals    Frequency    Min 3X/week      PT Plan Current plan remains appropriate    Co-evaluation              AM-PAC PT "6 Clicks" Mobility   Outcome Measure  Help needed turning from your back to your side while in a flat bed without using bedrails?: Total Help needed moving from lying on your back to sitting on the side of a flat bed without using bedrails?: Total Help needed moving to and from a bed to a chair (including a wheelchair)?: Total Help needed standing up from a chair using your arms (e.g., wheelchair or bedside chair)?: Total Help needed to walk in hospital room?: Total Help needed climbing 3-5 steps with a railing? : Total 6 Click Score: 6    End of Session Equipment Utilized During Treatment: Gait belt Activity Tolerance: Patient tolerated treatment well Patient left: with call bell/phone within reach;with family/visitor present;in chair;with chair alarm set Nurse  Communication: Mobility status PT Visit Diagnosis: History of falling (Z91.81);Pain;Muscle weakness (generalized) (M62.81);Other abnormalities of gait and mobility (R26.89) Pain - Right/Left: Right Pain - part of body: Knee     Time: 0123-7990 PT Time Calculation (min) (ACUTE ONLY): 29 min  Charges:  $Therapeutic Activity: 23-37 mins                     {Sebasthian Stailey  PTA Acute  Rehabilitation Services Office M-F          [phone] Weekend pager [phone]

## 2022-05-14 NOTE — Progress Notes (Signed)
[facility] IP rehab admissions - I have received a denial from [facility] for acute inpatient rehab admission.  I spoke with daughter and another family member and they want to pursue the expedited appeal.  I will see patient later today and start the appeal process.  Call me for questions.  [phone]

## 2022-05-14 NOTE — Plan of Care (Signed)
  Problem: Fluid Volume: Goal: Ability to maintain a balanced intake and output will improve Outcome: Progressing   Problem: Nutritional: Goal: Maintenance of adequate nutrition will improve Outcome: Progressing

## 2022-05-14 NOTE — Progress Notes (Addendum)
PROGRESS NOTE    Miranda Roman  LPF:[id number] DOB: 1945-12-07 DOA: 05/08/2022 PCP: Glendale Chard, MD    Chief Complaint  Patient presents with   Fall    Brief Narrative:  76 y.o. female with medical history significant of mild neurocognitive disorder, HTN, pulmonary HTN, bronchiectasis, T2DM, CKD4 who presents after a fall.    Pt just moved in to live with daughter and was tripped by 2 dogs today and fell. Did not hit her head or lose consciousness. She normally ambulates with walker but was not doing so at the time. Pt found to have R tib/fib fracture of distal radial metaphysis with mild volar apex angulation and acute nondisplaced fx of distal ulnar metaphysis   Assessment & Plan:   Principal Problem:   Tibia/fibula fracture, right, closed, initial encounter Active Problems:   Type II diabetes mellitus   Benign essential hypertension   Right radial fracture   Right distal ulnar fracture   Left wrist pain   Acute renal failure superimposed on stage 4 chronic kidney disease ([place])   Hypertension  #1 right tib-fib fracture -Secondary to mechanical fall.  No syncopal episodes. -Plain films of her right tib-fib showed acute mildly to moderately commuted and displaced fracture of the proximal tibial metadiaphysis.  Acutely mildly displaced and mildly impacted fracture of the proximal fibular metaphysis. -Patient seen in consultation by orthopedics and patient underwent right proximal tibia fracture open plating with IM nail fixation, closed management right distal radius and ulna fracture per orthopedics, Dr. Mardelle Matte 05/10/2022. -Pain management and DVT prophylaxis per orthopedics. -Patient seen by PT/OT recommending CIR. -CIR consulted and awaiting insurance authorization. -PT OT.  2.  Left wrist pain -Plain films of the left wrist negative for any fracture.  3.  Right radial fracture -Right wrist with acute fracture of the distal radial metaphysis with mild volar apex  angulation.  Acute nondisplaced fracture of the distal ulnar metaphysis. -Hand surgery, Dr. Greta Doom consulted and fracture is nonoperable and patient placed in a splint. -Outpatient follow-up with orthopedics/hand surgery.  4.  Hypertension -Continue Norvasc, Lopressor.  5.  Diabetes mellitus type 2 -Hemoglobin A1c 5.5 (01/29/2022) -CBG 184 this morning. -Continue Levemir, SSI.  6.  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 -Baseline creatinine approximately 2.3-2.5. -Creatinine went up as high as 3.08 and trended down with gentle hydration. -Creatinine at 2.87 this morning. -Gentle hydration for another 24 hours.    DVT prophylaxis: Lovenox Code Status: Full Family Communication: Updated caretaker at bedside.Updated daughter on telephone.  Disposition:.  CIR versus SNF.  Status is: Inpatient Remains inpatient appropriate because: Unsafe disposition   Consultants:  Orthopedics: [id number]: 05/09/2022  Procedures:  Plain films of the right tib-fib 05/10/2022, 05/08/2022 Plain films of the pelvis 05/09/2022 Plain films of the right femur 05/09/2022 Plain films of bilateral wrists 05/08/2022 Right proximal tibia fracture open plate and with IM nail fixation, closed management right distal radius and ulna fracture per Dr. Mardelle Matte orthopedics 05/10/2021  Antimicrobials:  Anti-infectives (From admission, onward)    Start     Dose/Rate Route Frequency Ordered Stop   05/10/22 2200  ceFAZolin (ANCEF) IVPB 2g/100 mL premix        2 g 200 mL/hr over 30 Minutes Intravenous Every 6 hours 05/10/22 1323 05/11/22 1317   05/10/22 1000  vancomycin (VANCOCIN) IVPB 1000 mg/200 mL premix        1,000 mg 200 mL/hr over 60 Minutes Intravenous  Once 05/10/22 0909 05/10/22 1055   05/10/22 0913  vancomycin (VANCOCIN) 1-5 GM/200ML-% IVPB       Note to Pharmacy: Dara Lords M: cabinet override      05/10/22 0913 05/10/22 1005          Subjective: Patient sitting up in bed.  Caretaker at bedside.  Patient denies any chest pain.  No shortness of breath.  No abdominal pain.  Awaiting insurance approval for CIR.  Objective: Vitals:   05/14/22 0159 05/14/22 0445 05/14/22 0737 05/14/22 1259  BP: (!) 181/81 (!) 170/76 (!) 149/81 138/62  Pulse: 88 94 95 (!) 103  Resp: _0 Temp: 98.7 F (37.1 C) (!) 100.6 F (38.1 C) 99.3 F (37.4 C) 98.7 F (37.1 C)  TempSrc: Oral Oral    SpO2: 99% 100% 97% 99%  Weight:      Height:        Intake/Output Summary (Last 24 hours) at 05/14/2022 1702 Last data filed at 05/14/2022 1259 Gross per 24 hour  Intake 960 ml  Output 2450 ml  Net -1490 ml   Filed Weights   05/08/22 2300  Weight: 78.2 kg    Examination:  General exam: Appears calm and comfortable  Respiratory system: Clear to auscultation. Respiratory effort normal. Cardiovascular system: S1 & S2 heard, RRR. No JVD, murmurs, rubs, gallops or clicks. No pedal edema. Gastrointestinal system: Abdomen is nondistended, soft and nontender. No organomegaly or masses felt. Normal bowel sounds heard. Central nervous system: Alert and oriented. No focal neurological deficits. Extremities: Right lower extremity in postop dressing.  Right upper extremity in splint.  Skin: No rashes, lesions or ulcers Psychiatry: Judgement and insight appear fair.. Mood & affect appropriate.     Data Reviewed: I have personally reviewed following labs and imaging studies  CBC: Recent Labs  Lab 05/10/22 0315 05/11/22 0310 05/12/22 0327 05/13/22 0342 05/14/22 0719  WBC 8.9 10.0 11.4* 8.5 9.5  HGB 9.2* 7.9* 7.9* 8.1* 8.1*  HCT 28.5* 24.8* 24.5* 25.2* 24.5*  MCV 73.8* 74.7* 73.1* 73.7* 72.1*  PLT 128* 111* 109* 133* 147*    Basic Metabolic Panel: Recent Labs  Lab 05/10/22 0315 05/10/22 2032 05/11/22 0310 05/12/22 0327 05/13/22 0342 05/14/22 0343  NA 140  --  138 139 140 141  K 4.6  --  5.1 4.4 4.6 4.4  CL 110  --  109  109 111 111  CO2 22  --  21* _1 GLUCOSE 191* 483* 244* 137* 130* 165*  BUN 44*  --  53* 51* 48* 48*  CREATININE 3.07*  --  3.08* 2.36* 2.64* 2.87*  CALCIUM 8.4*  --  8.2* 8.3* 8.1* 8.2*    GFR: Estimated Creatinine Clearance: 17.4 mL/min (A) (by C-G formula based on SCr of 2.87 mg/dL (H)).  Liver Function Tests: Recent Labs  Lab 05/10/22 0315 05/11/22 0310 05/12/22 0327 05/13/22 0342 05/14/22 0343  AST _2 ALT 33 21 7 <5 6  ALKPHOS 65 53 55 55 53  BILITOT 0.7 0.5 0.7 0.6 0.5  PROT 6.0* 5.7* 5.6* 5.7* 5.6*  ALBUMIN 3.2* 2.7* 2.7* 2.7* 2.7*    CBG: Recent Labs  Lab 05/13/22 1318 05/13/22 1909 05/13/22 2214 05/14/22 0808 05/14/22 1145  GLUCAP 284* 198* 175* 184* 305*     Recent Results (from the past 240 hour(s))  Surgical pcr screen     Status: None   Collection Time: 05/10/22  8:53 AM   Specimen: Nasal  Mucosa; Nasal Swab  Result Value Ref Range Status   MRSA, PCR NEGATIVE NEGATIVE Final   Staphylococcus aureus NEGATIVE NEGATIVE Final    Comment: (NOTE) The Xpert SA Assay (FDA approved for NASAL specimens in patients 83 years of age and older), is one component of a comprehensive surveillance program. It is not intended to diagnose infection nor to guide or monitor treatment. Performed at [facility], [place] [address], [place], [place] [address]          Radiology Studies: No results found.      Scheduled Meds:  amLODipine  2.5 mg Oral Daily   atorvastatin  20 mg Oral Daily   calcitRIOL  0.25 mcg Oral Daily   cyanocobalamin  1,000 mcg Oral Daily   docusate sodium  100 mg Oral BID   enoxaparin (LOVENOX) injection  30 mg Subcutaneous Q24H   gabapentin  100 mg Oral QHS   insulin aspart  0-15 Units Subcutaneous TID WC   insulin aspart  0-5 Units Subcutaneous QHS   insulin detemir  6 Units Subcutaneous Daily   loratadine  10 mg Oral QHS   metoprolol tartrate  50 mg Oral BID   senna  1 tablet Oral BID    sodium bicarbonate  650 mg Oral BID   Continuous Infusions:  sodium chloride 50 mL/hr at 05/14/22 1140   sodium chloride Stopped (05/10/22 1825)   methocarbamol (ROBAXIN) IV       LOS: 6 days    Time spent: 35 minutes    Irine Seal, MD [facility]   To contact the attending provider between 7A-7P or the covering provider during after hours 7P-7A, please log into the web site [email or web address] and access using universal [place] password for that web site. If you do not have the password, please call the hospital operator.  05/14/2022, 5:02 PM

## 2022-05-14 NOTE — Progress Notes (Signed)
[facility] IP rehab admissions - I met with patient, daughter and son (by phone).  All have been made aware of denial.  They wish to appeal the denial.  Appointment of representative form signed and clinicals faxed to the expedited appeals department at [facility].  Call me for questions.  [phone]

## 2022-05-15 ENCOUNTER — Inpatient Hospital Stay (HOSPITAL_COMMUNITY): Payer: Medicare Other

## 2022-05-15 DIAGNOSIS — I10 Essential (primary) hypertension: Secondary | ICD-10-CM | POA: Diagnosis not present

## 2022-05-15 DIAGNOSIS — D72829 Elevated white blood cell count, unspecified: Secondary | ICD-10-CM

## 2022-05-15 DIAGNOSIS — S82201A Unspecified fracture of shaft of right tibia, initial encounter for closed fracture: Secondary | ICD-10-CM | POA: Diagnosis not present

## 2022-05-15 DIAGNOSIS — N179 Acute kidney failure, unspecified: Secondary | ICD-10-CM | POA: Diagnosis not present

## 2022-05-15 DIAGNOSIS — S52501K Unspecified fracture of the lower end of right radius, subsequent encounter for closed fracture with nonunion: Secondary | ICD-10-CM | POA: Diagnosis not present

## 2022-05-15 LAB — URINALYSIS, ROUTINE W REFLEX MICROSCOPIC
Bilirubin Urine: NEGATIVE
Glucose, UA: 50 mg/dL — AB
Hgb urine dipstick: NEGATIVE
Ketones, ur: NEGATIVE mg/dL
Nitrite: NEGATIVE
Protein, ur: 100 mg/dL — AB
Specific Gravity, Urine: 1.009 (ref 1.005–1.030)
pH: 6 (ref 5.0–8.0)

## 2022-05-15 LAB — CBC
HCT: 27.9 % — ABNORMAL LOW (ref 36.0–46.0)
Hemoglobin: 9 g/dL — ABNORMAL LOW (ref 12.0–15.0)
MCH: 23.9 pg — ABNORMAL LOW (ref 26.0–34.0)
MCHC: 32.3 g/dL (ref 30.0–36.0)
MCV: 74.2 fL — ABNORMAL LOW (ref 80.0–100.0)
Platelets: 177 10*3/uL (ref 150–400)
RBC: 3.76 MIL/uL — ABNORMAL LOW (ref 3.87–5.11)
RDW: 15.9 % — ABNORMAL HIGH (ref 11.5–15.5)
WBC: 14.7 10*3/uL — ABNORMAL HIGH (ref 4.0–10.5)
nRBC: 0 % (ref 0.0–0.2)

## 2022-05-15 LAB — MAGNESIUM: Magnesium: 2.1 mg/dL (ref 1.7–2.4)

## 2022-05-15 LAB — GLUCOSE, CAPILLARY
Glucose-Capillary: 191 mg/dL — ABNORMAL HIGH (ref 70–99)
Glucose-Capillary: 273 mg/dL — ABNORMAL HIGH (ref 70–99)
Glucose-Capillary: 246 mg/dL — ABNORMAL HIGH (ref 70–99)
Glucose-Capillary: 266 mg/dL — ABNORMAL HIGH (ref 70–99)

## 2022-05-15 LAB — BASIC METABOLIC PANEL
Anion gap: 8 (ref 5–15)
BUN: 53 mg/dL — ABNORMAL HIGH (ref 8–23)
CO2: 21 mmol/L — ABNORMAL LOW (ref 22–32)
Calcium: 8.4 mg/dL — ABNORMAL LOW (ref 8.9–10.3)
Chloride: 112 mmol/L — ABNORMAL HIGH (ref 98–111)
Creatinine, Ser: 2.86 mg/dL — ABNORMAL HIGH (ref 0.44–1.00)
GFR, Estimated: 17 mL/min — ABNORMAL LOW (ref >60)
Glucose, Bld: 239 mg/dL — ABNORMAL HIGH (ref 70–99)
Potassium: 4.4 mmol/L (ref 3.5–5.1)
Sodium: 141 mmol/L (ref 135–145)

## 2022-05-15 MED ORDER — INSULIN DETEMIR 100 UNIT/ML ~~LOC~~ SOLN
4.0000 [IU] | Freq: Once | SUBCUTANEOUS | Status: AC
  Administered 2022-05-15: 4 [IU] via SUBCUTANEOUS
  Filled 2022-05-15: qty 0.04

## 2022-05-15 MED ORDER — INSULIN DETEMIR 100 UNIT/ML ~~LOC~~ SOLN
10.0000 [IU] | Freq: Every day | SUBCUTANEOUS | Status: DC
  Administered 2022-05-16 – 2022-05-19 (×4): 10 [IU] via SUBCUTANEOUS
  Filled 2022-05-15 (×4): qty 0.1

## 2022-05-15 NOTE — Plan of Care (Signed)
  Problem: Coping: Goal: Ability to adjust to condition or change in health will improve Outcome: Progressing   Problem: Nutritional: Goal: Maintenance of adequate nutrition will improve Outcome: Progressing

## 2022-05-15 NOTE — Progress Notes (Signed)
     Subjective: 5 Days Post-Op s/p Procedure(s): OPEN REDUCTION INTERNAL FIXATION (ORIF) TIBIA/FIBULA FRACTURE   Patient is resting in hospital bed. States pain is minimal. No other complaints. Inpatient rehab was denied. Family wishes to appeal the denial.      Objective:  PE: VITALS:   Vitals:   05/14/22 0737 05/14/22 1259 05/14/22 2137 05/15/22 0502  BP: (!) 149/81 138/62 (!) 146/85 (!) 147/85  Pulse: 95 (!) 103 (!) 102 (!) 103  Resp: _0 Temp: 99.3 F (37.4 C) 98.7 F (37.1 C) 100.1 F (37.8 C) 100.2 F (37.9 C)  TempSrc:    Oral  SpO2: 97% 99% 97% 95%  Weight:      Height:       General: alert, in no acute distress  MSK:  RUE: Splint CDI. Skin intact though cannot assess fully beneath splint. Nontender to palpation proximally. Swelling of right hand. She is able to wiggle all fingers. Sensation intact in medial, radial, and ulnar distributions. Well perfused digits.  RLE: Splint intact. Patient endorses sensation to toes. Able to flex and extend all toes.    LABS  Results for orders placed or performed during the hospital encounter of 05/08/22 (from the past 24 hour(s))  Glucose, capillary     Status: Abnormal   Collection Time: 05/14/22  8:08 AM  Result Value Ref Range   Glucose-Capillary 184 (H) 70 - 99 mg/dL  Glucose, capillary     Status: Abnormal   Collection Time: 05/14/22 11:45 AM  Result Value Ref Range   Glucose-Capillary 305 (H) 70 - 99 mg/dL  Glucose, capillary     Status: Abnormal   Collection Time: 05/14/22  6:07 PM  Result Value Ref Range   Glucose-Capillary 179 (H) 70 - 99 mg/dL  Glucose, capillary     Status: Abnormal   Collection Time: 05/14/22  9:40 PM  Result Value Ref Range   Glucose-Capillary 238 (H) 70 - 99 mg/dL    No results found.  Assessment/Plan: Right tibia fracture 5 Days Post-Op s/p Procedure(s): OPEN REDUCTION INTERNAL FIXATION (ORIF) TIBIA/FIBULA FRACTURE  Right distal radius fracture - managing  non-operatively with splint - better fitting volar splint in place  Weightbearing: NWB RLE, NWB right wrist able to WB through right elbow, up with therapy as able VTE prophylaxis: lovenox x 30 days Pain control: continue current regimen Follow - up plan: 2 weeks with Dr. Mardelle Matte Dispo: patient requesting CIR consult, awaiting news of this consult. CIR was denied. Family wishes to appeal. Okay to discharge from ortho standpoint when decision reached.  Contact information:   [name], PA-C, Dr. Mardelle Matte, Weekdays 8-5  After hours and holidays please check [email or web address] for group call information for [facility] [place] [facility] 05/15/2022, 7:44 AM

## 2022-05-15 NOTE — Progress Notes (Signed)
PROGRESS NOTE    Miranda Roman  KAJ:[id number] DOB: 06-27-1946 DOA: 05/08/2022 PCP: Glend[place] Chard, MD    Chief Complaint  Patient presents with   Fall    Brief Narrative:  76 y.o. female with medical history significant of mild neurocognitive disorder, HTN, pulmonary HTN, bronchiectasis, T2DM, CKD4 who presents after a fall.    Pt just moved in to live with daughter and was tripped by 2 dogs today and fell. Did not hit her head or lose consciousness. She normally ambulates with walker but was not doing so at the time. Pt found to have R tib/fib fracture of distal radial metaphysis with mild volar apex angulation and acute nondisplaced fx of distal ulnar metaphysis   Assessment & Plan:   Principal Problem:   Tibia/fibula fracture, right, closed, initial encounter Active Problems:   Type II diabetes mellitus   Benign essential hypertension   Right radial fracture   Right distal ulnar fracture   Left wrist pain   Acute renal failure superimposed on stage 4 chronic kidney disease ([place])   Hypertension  #1 right tib-fib fracture -Secondary to mechanical fall.  No syncopal episodes. -Plain films of her right tib-fib showed acute mildly to moderately commuted and displaced fracture of the proximal tibial metadiaphysis.  Acutely mildly displaced and mildly impacted fracture of the proximal fibular metaphysis. -Patient seen in consultation by orthopedics and patient underwent right proximal tibia fracture open plating with IM nail fixation, closed management right distal radius and ulna fracture per orthopedics, Dr. Mardelle Matte 05/10/2022. -Pain management and DVT prophylaxis per orthopedics. -Patient seen by PT/OT recommending CIR. -CIR consulted and initial denial by insurance.   -Appeal process underway. -She noted prior to hospitalization to have been ambulating with a walker per daughter and caretaker. -Outpatient follow-up with orthopedics. -PT OT.  2.  Left wrist pain -Plain  films of the left wrist negative for any fracture.  3.  Right radial fracture -Right wrist with acute fracture of the distal radial metaphysis with mild volar apex angulation.  Acute nondisplaced fracture of the distal ulnar metaphysis. -Hand surgery, Dr. Greta Doom consulted and fracture is nonoperable and patient placed in a splint. -Outpatient follow-up with orthopedics/hand surgery.  4.  Hypertension -Lopressor, Norvasc.   5.  Diabetes mellitus type 2 -Hemoglobin A1c 5.5 (01/29/2022) -CBG 191 this morning. -Increase Levemir to 10 units daily.   -Continue SSI.   6.  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 -Baseline creatinine approximately 2.3-2.5. -Creatinine went up as high as 3.08 and trended down with gentle hydration. -Creatinine at 2.86 this morning, and seems to be stabilizing. -Continue calcitriol. -Saline lock IV fluids and follow.  7.  Leukocytosis -Patient with a Tmax of 100.2. -WBC trending up and currently at 14.7 this morning. -Check a UA with cultures and sensitivities. -Check a chest x-ray. -Follow.     DVT prophylaxis: Lovenox Code Status: Full Family Communication: Updated patient and daughter at bedside.   Disposition:.  CIR (appeal process underway) versus SNF.  Status is: Inpatient Remains inpatient appropriate because: Unsafe disposition   Consultants:  Orthopedics: [email or web address]: 05/09/2022  Procedures:  Plain films of the right tib-fib 05/10/2022, 05/08/2022 Plain films of the pelvis 05/09/2022 Plain films of the right femur 05/09/2022 Plain films of bilateral wrists 05/08/2022 Right proximal tibia fracture open plate and with IM nail fixation, closed management right distal radius and ulna fracture per Dr. Mardelle Matte orthopedics 05/10/2021  Antimicrobials:  Anti-infectives (From  admission, onward)    Start     Dose/Rate Route Frequency Ordered Stop   05/10/22 2200  ceFAZolin (ANCEF)  IVPB 2g/100 mL premix        2 g 200 mL/hr over 30 Minutes Intravenous Every 6 hours 05/10/22 1323 05/11/22 1317   05/10/22 1000  vancomycin (VANCOCIN) IVPB 1000 mg/200 mL premix        1,000 mg 200 mL/hr over 60 Minutes Intravenous  Once 05/10/22 0909 05/10/22 1055   05/10/22 0913  vancomycin (VANCOCIN) 1-5 GM/200ML-% IVPB       Note to Pharmacy: Dara Lords M: cabinet override      05/10/22 0913 05/10/22 1005         Subjective: Laying in bed.  Daughter at bedside.  Denies any chest pain.  No shortness of breath.  Patient denied any abdominal pain however per daughter had some abdominal discomfort, that patient states resolved after having bowel movement this morning.  Tmax 100.2.  Objective: Vitals:   05/14/22 0737 05/14/22 1259 05/14/22 2137 05/15/22 0502  BP: (!) 149/81 138/62 (!) 146/85 (!) 147/85  Pulse: 95 (!) 103 (!) 102 (!) 103  Resp: _0 Temp: 99.3 F (37.4 C) 98.7 F (37.1 C) 100.1 F (37.8 C) 100.2 F (37.9 C)  TempSrc:    Oral  SpO2: 97% 99% 97% 95%  Weight:      Height:        Intake/Output Summary (Last 24 hours) at 05/15/2022 1109 Last data filed at 05/15/2022 1000 Gross per 24 hour  Intake 1000 ml  Output 1850 ml  Net -850 ml    Filed Weights   05/08/22 2300  Weight: 78.2 kg    Examination:  General exam: NAD Respiratory system: CTA B anterior lung fields.  No wheezes, no crackles, no rhonchi.  Fair air movement.   Cardiovascular system: Regular rate rhythm no murmurs rubs or gallops.  No JVD.  No lower extremity edema. Gastrointestinal system: Abdomen is soft, nondistended, nontender, positive bowel sounds.  No rebound.  No guarding.   Central nervous system: Alert and oriented. No focal neurological deficits. Extremities: Right lower extremity in postop dressing.  Right upper extremity in splint.  Skin: No rashes, lesions or ulcers Psychiatry: Judgement and insight appear fair.. Mood & affect appropriate.     Data Reviewed: I have  personally reviewed following labs and imaging studies  CBC: Recent Labs  Lab 05/11/22 0310 05/12/22 0327 05/13/22 0342 05/14/22 0719 05/15/22 1035  WBC 10.0 11.4* 8.5 9.5 14.7*  HGB 7.9* 7.9* 8.1* 8.1* 9.0*  HCT 24.8* 24.5* 25.2* 24.5* 27.9*  MCV 74.7* 73.1* 73.7* 72.1* 74.2*  PLT 111* 109* 133* 147* 177     Basic Metabolic Panel: Recent Labs  Lab 05/10/22 0315 05/10/22 2032 05/11/22 0310 05/12/22 0327 05/13/22 0342 05/14/22 0343  NA 140  --  138 139 140 141  K 4.6  --  5.1 4.4 4.6 4.4  CL 110  --  109 109 111 111  CO2 22  --  21* _1 GLUCOSE 191* 483* 244* 137* 130* 165*  BUN 44*  --  53* 51* 48* 48*  CREATININE 3.07*  --  3.08* 2.36* 2.64* 2.87*  CALCIUM 8.4*  --  8.2* 8.3* 8.1* 8.2*     GFR: Estimated Creatinine Clearance: 17.4 mL/min (A) (by C-G formula based on SCr of 2.87 mg/dL (H)).  Liver Function Tests: Recent Labs  Lab 05/10/22 0315 05/11/22 0310 05/12/22 0327 05/13/22  2202 05/14/22 0343  AST _0 ALT 33 21 7 <5 6  ALKPHOS 65 53 55 55 53  BILITOT 0.7 0.5 0.7 0.6 0.5  PROT 6.0* 5.7* 5.6* 5.7* 5.6*  ALBUMIN 3.2* 2.7* 2.7* 2.7* 2.7*     CBG: Recent Labs  Lab 05/14/22 0808 05/14/22 1145 05/14/22 1807 05/14/22 2140 05/15/22 0807  GLUCAP 184* 305* 179* 238* 191*      Recent Results (from the past 240 hour(s))  Surgical pcr screen     Status: None   Collection Time: 05/10/22  8:53 AM   Specimen: Nasal Mucosa; Nasal Swab  Result Value Ref Range Status   MRSA, PCR NEGATIVE NEGATIVE Final   Staphylococcus aureus NEGATIVE NEGATIVE Final    Comment: (NOTE) The Xpert SA Assay (FDA approved for NASAL specimens in patients 13 years of age and older), is one component of a comprehensive surveillance program. It is not intended to diagnose infection nor to guide or monitor treatment. Performed at [facility], [place] [address], [place], [place] [address]          Radiology Studies: No results  found.      Scheduled Meds:  amLODipine  2.5 mg Oral Daily   atorvastatin  20 mg Oral Daily   calcitRIOL  0.25 mcg Oral Daily   cyanocobalamin  1,000 mcg Oral Daily   docusate sodium  100 mg Oral BID   enoxaparin (LOVENOX) injection  30 mg Subcutaneous Q24H   gabapentin  100 mg Oral QHS   insulin aspart  0-15 Units Subcutaneous TID WC   insulin aspart  0-5 Units Subcutaneous QHS   insulin detemir  6 Units Subcutaneous Daily   loratadine  10 mg Oral QHS   metoprolol tartrate  50 mg Oral BID   senna  1 tablet Oral BID   sodium bicarbonate  650 mg Oral BID   Continuous Infusions:  sodium chloride Stopped (05/10/22 1825)   methocarbamol (ROBAXIN) IV       LOS: 7 days    Time spent: 35 minutes    Irine Seal, MD [facility]   To contact the attending provider between 7A-7P or the covering provider during after hours 7P-7A, please log into the web site [email or web address] and access using universal [place] password for that web site. If you do not have the password, please call the hospital operator.  05/15/2022, 11:09 AM

## 2022-05-15 NOTE — Progress Notes (Signed)
Pt with C/O stomach pain and spasms. Daughter in room very concerned. Discussed with pt/daughter options for pain control. Pt had received ultram 50 2006.  Pt received 64m Morphine IV push, 151mReglan IV push, 5006mobaxin PO,   Pt's daughter's Brother voiced concern of appendicitis.  They have requested she be checked by the on call provider.

## 2022-05-15 NOTE — Progress Notes (Signed)
Physical Therapy Treatment Patient Details Name: Miranda Roman MRN: [id number] DOB: [birth year] Today's Date: 05/15/2022   History of Present Illness 76 yo female admitted with R proximal tibia/fibula fx, R distal radius/ulna fx. S/P IM nailing R tibia 11/4. Non-operative management of R wrist fx-splinted. Hx of mild neurocognitive d/o, osteoporosis, pulm HTN, DM, CKD.    PT Comments    POD # 4 Per chart review, Pt's Insurance has denied CIR.  Consulted LPT new rec would be SNF.  Family aware. Assisted OOB was difficult and required + 2 assist.  Pt unable to support her weight at NWB R LE even with 2 assist and use of RIGHT platform walker.  Pt unable to complete 1/4 pivot.  Assisted to recliner and attempted to perform some TE's.  Pt NOT putting forth effort to perform.  Daughter present during session and VERY supportive/encouraging however pt still shows low motivation/drive to engage in her Therapy.  Positioned in recliner to comfort with multiple pillows.  Rec Maxi Move LIFT for back to bed.    Recommendations for follow up therapy are one component of a multi-disciplinary discharge planning process, led by the attending physician.  Recommendations may be updated based on patient status, additional functional criteria and insurance authorization.  Follow Up Recommendations  Skilled nursing-short term rehab (<3 hours/day) Can patient physically be transported by private vehicle: No   Assistance Recommended at Discharge Frequent or constant Supervision/Assistance  Patient can return home with the following Two people to help with walking and/or transfers;Two people to help with bathing/dressing/bathroom;Assistance with cooking/housework;Assist for transportation;Help with stairs or ramp for entrance   Equipment Recommendations       Recommendations for Other Services       Precautions / Restrictions Precautions Precautions: Fall Precaution Comments: R knee pain - hold leg  up Required Braces or Orthoses: Splint/Cast Splint/Cast: R UE + R LE Restrictions Weight Bearing Restrictions: Yes RUE Weight Bearing: Non weight bearing RLE Weight Bearing: Non weight bearing Other Position/Activity Restrictions: Can bear weight through right elbow, not wrist     Mobility  Bed Mobility Overal bed mobility: Needs Assistance       Supine to sit: Max assist, +2 for physical assistance, +2 for safety/equipment     General bed mobility comments: Pt required Max/Total Assist to transition to EOB with increased time.  Attempted to demonstrate how to use belt to self assist LE however pt offers little self effort.  Utilized bed pad to complete scooting was difficu;t. Sitting EOB x 4 min with poor forward flexed collapsed posture.    Transfers Overall transfer level: Needs assistance Equipment used: Right platform walker Transfers: Sit to/from Stand, Bed to chair/wheelchair/BSC Sit to Stand: Total assist, +2 physical assistance, +2 safety/equipment, From elevated surface           General transfer comment: required + 2 Max Assist to partially rise from bed with poor forward flexed posture and inability to complete 1/4 pivot to reclier or self weight shift.    Ambulation/Gait               General Gait Details: currently Non Amb   Stairs             Wheelchair Mobility    Modified Rankin (Stroke Patients Only)       Balance  Cognition Arousal/Alertness: Awake/alert Behavior During Therapy: WFL for tasks assessed/performed Overall Cognitive Status: Within Functional Limits for tasks assessed                               Problem Solving: Difficulty sequencing, Requires verbal cues, Requires tactile cues, Slow processing General Comments: Alert and oriented but exhibited slow processing.        Exercises  Attempted a few TE's but even with MAX encouragement and  AAROM, Pt shows no effort.      General Comments        Pertinent Vitals/Pain Pain Assessment Pain Assessment: Faces Faces Pain Scale: Hurts even more Pain Location: R knee with activity/movement Pain Descriptors / Indicators: Guarding, Grimacing, Moaning, Aching Pain Intervention(s): Monitored during session, Premedicated before session, Repositioned, Ice applied    Home Living                          Prior Function            PT Goals (current goals can now be found in the care plan section) Progress towards PT goals: Progressing toward goals    Frequency    Min 3X/week      PT Plan Current plan remains appropriate    Co-evaluation              AM-PAC PT "6 Clicks" Mobility   Outcome Measure  Help needed turning from your back to your side while in a flat bed without using bedrails?: Total Help needed moving from lying on your back to sitting on the side of a flat bed without using bedrails?: Total Help needed moving to and from a bed to a chair (including a wheelchair)?: Total Help needed standing up from a chair using your arms (e.g., wheelchair or bedside chair)?: Total Help needed to walk in hospital room?: Total Help needed climbing 3-5 steps with a railing? : Total 6 Click Score: 6    End of Session Equipment Utilized During Treatment: Gait belt Activity Tolerance: Patient tolerated treatment well Patient left: with call bell/phone within reach;with family/visitor present;in chair;with chair alarm set Nurse Communication: Mobility status PT Visit Diagnosis: History of falling (Z91.81);Pain;Muscle weakness (generalized) (M62.81);Other abnormalities of gait and mobility (R26.89) Pain - Right/Left: Right Pain - part of body: Knee     Time: 1735-6701 PT Time Calculation (min) (ACUTE ONLY): 21 min  Charges:  $Therapeutic Activity: 8-22 mins                     Rica Koyanagi  PTA Acute  Rehabilitation Services Office M-F           [phone] Weekend pager [phone]

## 2022-05-15 NOTE — TOC Progression Note (Signed)
Transition of Care [facility]) - Progression Note    Patient Details  Name: Miranda Roman MRN: [id number] Date of Birth: April 10, 1946  Transition of [facility]) CM/SW Contact  Henrietta Dine, RN Phone Number: 05/15/2022, 3:03 PM  Clinical Narrative:    Awaiting outcome of appeal for CIR; TOC will con't to follow.   Expected Discharge Plan: [place] (vs. SNF) Barriers to Discharge: Continued Medical Work up, Ship broker  Expected Discharge Plan and Services Expected Discharge Plan: [place] (vs. SNF) In-house Referral: Clinical Social Work   Post Acute Care Choice: IP Rehab, [place] Living arrangements for the past 2 months: Single Family Home                                       Social Determinants of Health (SDOH) Interventions    Readmission Risk Interventions    05/12/2022    3:18 PM  Readmission Risk Prevention Plan  Transportation Screening Complete  PCP or Specialist Appt within 5-7 Days Complete  Home Care Screening Complete  Medication Review (RN CM) Complete

## 2022-05-16 ENCOUNTER — Inpatient Hospital Stay (HOSPITAL_COMMUNITY): Payer: Medicare Other

## 2022-05-16 ENCOUNTER — Other Ambulatory Visit (HOSPITAL_COMMUNITY): Payer: Self-pay

## 2022-05-16 DIAGNOSIS — S82201A Unspecified fracture of shaft of right tibia, initial encounter for closed fracture: Secondary | ICD-10-CM | POA: Diagnosis not present

## 2022-05-16 DIAGNOSIS — S52501K Unspecified fracture of the lower end of right radius, subsequent encounter for closed fracture with nonunion: Secondary | ICD-10-CM | POA: Diagnosis not present

## 2022-05-16 DIAGNOSIS — N179 Acute kidney failure, unspecified: Secondary | ICD-10-CM | POA: Diagnosis not present

## 2022-05-16 DIAGNOSIS — I10 Essential (primary) hypertension: Secondary | ICD-10-CM | POA: Diagnosis not present

## 2022-05-16 DIAGNOSIS — J189 Pneumonia, unspecified organism: Secondary | ICD-10-CM

## 2022-05-16 LAB — HEMOGLOBIN AND HEMATOCRIT, BLOOD
HCT: 25 % — ABNORMAL LOW (ref 36.0–46.0)
Hemoglobin: 8 g/dL — ABNORMAL LOW (ref 12.0–15.0)

## 2022-05-16 LAB — CBC WITH DIFFERENTIAL/PLATELET
Abs Immature Granulocytes: 0.23 10*3/uL — ABNORMAL HIGH (ref 0.00–0.07)
Basophils Absolute: 0.1 10*3/uL (ref 0.0–0.1)
Basophils Relative: 0 %
Eosinophils Absolute: 0 10*3/uL (ref 0.0–0.5)
Eosinophils Relative: 0 %
HCT: 21.9 % — ABNORMAL LOW (ref 36.0–46.0)
Hemoglobin: 7.1 g/dL — ABNORMAL LOW (ref 12.0–15.0)
Immature Granulocytes: 1 %
Lymphocytes Relative: 9 %
Lymphs Abs: 2.1 10*3/uL (ref 0.7–4.0)
MCH: 24.1 pg — ABNORMAL LOW (ref 26.0–34.0)
MCHC: 32.4 g/dL (ref 30.0–36.0)
MCV: 74.5 fL — ABNORMAL LOW (ref 80.0–100.0)
Monocytes Absolute: 2.9 10*3/uL — ABNORMAL HIGH (ref 0.1–1.0)
Monocytes Relative: 12 %
Neutro Abs: 17.9 10*3/uL — ABNORMAL HIGH (ref 1.7–7.7)
Neutrophils Relative %: 78 %
Platelets: 166 10*3/uL (ref 150–400)
RBC: 2.94 MIL/uL — ABNORMAL LOW (ref 3.87–5.11)
RDW: 15.9 % — ABNORMAL HIGH (ref 11.5–15.5)
WBC: 23.2 10*3/uL — ABNORMAL HIGH (ref 4.0–10.5)
nRBC: 0.2 % (ref 0.0–0.2)

## 2022-05-16 LAB — URINE CULTURE: Culture: NO GROWTH

## 2022-05-16 LAB — RENAL FUNCTION PANEL
Albumin: 2.7 g/dL — ABNORMAL LOW (ref 3.5–5.0)
Anion gap: 11 (ref 5–15)
BUN: 56 mg/dL — ABNORMAL HIGH (ref 8–23)
CO2: 19 mmol/L — ABNORMAL LOW (ref 22–32)
Calcium: 8.2 mg/dL — ABNORMAL LOW (ref 8.9–10.3)
Chloride: 111 mmol/L (ref 98–111)
Creatinine, Ser: 3.09 mg/dL — ABNORMAL HIGH (ref 0.44–1.00)
GFR, Estimated: 15 mL/min — ABNORMAL LOW (ref >60)
Glucose, Bld: 206 mg/dL — ABNORMAL HIGH (ref 70–99)
Phosphorus: 4.4 mg/dL (ref 2.5–4.6)
Potassium: 5.1 mmol/L (ref 3.5–5.1)
Sodium: 141 mmol/L (ref 135–145)

## 2022-05-16 LAB — GLUCOSE, CAPILLARY
Glucose-Capillary: 197 mg/dL — ABNORMAL HIGH (ref 70–99)
Glucose-Capillary: 204 mg/dL — ABNORMAL HIGH (ref 70–99)
Glucose-Capillary: 232 mg/dL — ABNORMAL HIGH (ref 70–99)
Glucose-Capillary: 255 mg/dL — ABNORMAL HIGH (ref 70–99)

## 2022-05-16 LAB — PREPARE RBC (CROSSMATCH)

## 2022-05-16 LAB — PROCALCITONIN: Procalcitonin: 8.35 ng/mL

## 2022-05-16 MED ORDER — SODIUM CHLORIDE 0.9 % IV SOLN
2.0000 g | INTRAVENOUS | Status: DC
  Administered 2022-05-16 – 2022-05-19 (×4): 2 g via INTRAVENOUS
  Filled 2022-05-16 (×3): qty 20

## 2022-05-16 MED ORDER — FLEET ENEMA 7-19 GM/118ML RE ENEM: 1.0000 | ENEMA | Freq: Once | RECTAL | Status: DC

## 2022-05-16 MED ORDER — SODIUM CHLORIDE 0.9 % IV SOLN: INTRAVENOUS | Status: AC

## 2022-05-16 MED ORDER — SODIUM CHLORIDE 0.9% IV SOLUTION: Freq: Once | INTRAVENOUS | Status: AC

## 2022-05-16 MED ORDER — SODIUM CHLORIDE 0.9 % IV SOLN
500.0000 mg | INTRAVENOUS | Status: DC
  Administered 2022-05-16 – 2022-05-18 (×3): 500 mg via INTRAVENOUS
  Filled 2022-05-16 (×3): qty 5

## 2022-05-16 NOTE — Significant Event (Signed)
Rapid Response Event Note   Reason for Call :  Elevated MEWS, MEWS Score 6  Notified by bedside RN in regards to elevated MEWS. Patient had an elevated temperature of 103.1 F with tachycardia and tachypnea. MD Grandville Silos notified as well and placed orders for blood cultures, CBC, strep pna/legionella urine, and azithromycin and rocephin IV. Patient able to follow commands, slightly delayed but according to daughter at beside this is her normal.     Initial Focused Assessment:  Neuro: Alert, oriented to person, place, situation, disoriented to time. Able to follow commands. Temp: 103.1 F Oral. Pain complaining on right side of chest, and after swallowing. Notified MD Grandville Silos in regards to pain  Cardiac: ST, HR 120s, s1 and s2 auscultated  Pulmonary: 4L [place], RR 26, O2 Sats 98%, No respiratory distress or accessory muscle use. Breath sounds clear in upper bases, diminished in lower  Abdominal: Bowel sounds active in all quadrants, some distention Right tibia fracture: ORIF Dressing covering incision, able to inspect, the majority of the incision inspected-clean, dry, intact    Interventions:  Beside RN giving tylenol PO for fever -Remove extra blankets, ice bags  -Surveyor, mining  Place patient on Tele per MD orders  -Continuous pulse ox  Plan of Care:  Continue to monitor patient on [address] with telemetry, continuous pulse ox. If patient has a change in mental status or hemodynamic status please call Rapid Response. If patient has an increase in MEWs please call Rapid. Currently, patient's MEWS is out of the Red and in the yellow.     Event Summary:   MD Notified: MD Grandville Silos at bedside  Call Time: approximately at [place] Time: 0904 End Time: [place], RN

## 2022-05-16 NOTE — Progress Notes (Signed)
EKG has been obtained. Results: Normal sinus rhythm. Results put in the patient's chart.

## 2022-05-16 NOTE — Plan of Care (Signed)
  Problem: Education: Goal: Knowledge of General Education information will improve Description: Including pain rating scale, medication(s)/side effects and non-pharmacologic comfort measures Outcome: Progressing

## 2022-05-16 NOTE — Progress Notes (Signed)
PROGRESS NOTE    Miranda Roman  ZOX:[id number] DOB: Aug 12, 1945 DOA: 05/08/2022 PCP: [place] Chard, MD    Chief Complaint  Patient presents with   Fall    Brief Narrative:  76 y.o. female with medical history significant of mild neurocognitive disorder, HTN, pulmonary HTN, bronchiectasis, T2DM, CKD4 who presents after a fall.    Pt just moved in to live with daughter and was tripped by 2 dogs today and fell. Did not hit her head or lose consciousness. She normally ambulates with walker but was not doing so at the time. Pt found to have R tib/fib fracture of distal radial metaphysis with mild volar apex angulation and acute nondisplaced fx of distal ulnar metaphysis   Assessment & Plan:   Principal Problem:   Tibia/fibula fracture, right, closed, initial encounter Active Problems:   PNA (pneumonia)   Type II diabetes mellitus   Benign essential hypertension   Leukocytosis   Right radial fracture   Right distal ulnar fracture   Left wrist pain   Acute renal failure superimposed on stage 4 chronic kidney disease ([place])   Hypertension  #1 right tib-fib fracture -Secondary to mechanical fall.  No syncopal episodes. -Plain films of her right tib-fib showed acute mildly to moderately commuted and displaced fracture of the proximal tibial metadiaphysis.  Acutely mildly displaced and mildly impacted fracture of the proximal fibular metaphysis. -Patient seen in consultation by orthopedics and patient underwent right proximal tibia fracture open plating with IM nail fixation, closed management right distal radius and ulna fracture per orthopedics, Dr. Mardelle Matte 05/10/2022. -Pain management and DVT prophylaxis per orthopedics. -Patient seen by PT/OT recommending CIR. -CIR consulted and initial denial by insurance.   -Appeal process underway. -Patient noted prior to hospitalization to have been ambulating with a walker per daughter and caretaker. -Outpatient follow-up with  orthopedics. -PT OT.  2.  Left wrist pain -Plain films of the left wrist negative for any fracture.  3.  CAP  -This morning was notified by RN that patient with a temp of 103.1, noted to be tachycardic, increased respiratory rate and desats.  -CBC pending from this morning.  Blood cultures also ordered this morning and pending.  -Patient noted overnight to have new abdominal pain, assessed by NP on-call CT abdomen and pelvis were done which are concerning for patchy infiltrates in the bases.  -Check a urine Legionella antigen, urine pneumococcus antigen.  -Place empirically on IV Rocephin and IV azithromycin.  -Urinalysis done on 05/15/2022 with trace leukocytes, nitrite negative, 6-10 WBCs.  Urine cultures pending. -SLP evaluation. -Supportive care.    4.  Right radial fracture -Right wrist with acute fracture of the distal radial metaphysis with mild volar apex angulation.  Acute nondisplaced fracture of the distal ulnar metaphysis. -Hand surgery, Dr. Greta Doom consulted and fracture is nonoperable and patient placed in a splint. -Outpatient follow-up with orthopedics/hand surgery.  5.  Hypertension -Continue Lopressor, Norvasc.   6.  Diabetes mellitus type 2 -Hemoglobin A1c 5.5 (01/29/2022) -CBG 197 this morning. -Continue Levemir 10 units daily.   -Continue SSI.   7.  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 -Baseline creatinine approximately 2.3-2.6. -Creatinine went up as high as 3.08 and trended down with gentle hydration. -Creatinine trending back up currently at 3.09 this morning.   -CT abdomen and pelvis done this morning negative for hydronephrosis.  -Patient with urine output of 1.340 L over the past 24 hours.  -Placed  on gentle hydration.  -Discussed with nephrology.  -Continue calcitriol. -Repeat labs in the AM.    DVT prophylaxis: Lovenox Code Status: Roman Family Communication: Updated patient and  daughter at bedside.   Disposition:.  CIR (appeal process underway) versus SNF.  Status is: Inpatient Remains inpatient appropriate because: Unsafe disposition   Consultants:  Orthopedics: Dr.Marchwiany: 05/09/2022  Procedures:  Plain films of the right tib-fib 05/10/2022, 05/08/2022 Plain films of the pelvis 05/09/2022 Plain films of the right femur 05/09/2022 Plain films of bilateral wrists 05/08/2022 Right proximal tibia fracture open plate and with IM nail fixation, closed management right distal radius and ulna fracture per Dr. Mardelle Matte orthopedics 05/10/2021 CT abdomen and pelvis 05/16/2022  Antimicrobials:  Anti-infectives (From admission, onward)    Start     Dose/Rate Route Frequency Ordered Stop   05/16/22 1000  cefTRIAXone (ROCEPHIN) 2 g in sodium chloride 0.9 % 100 mL IVPB        2 g 200 mL/hr over 30 Minutes Intravenous Every 24 hours 05/16/22 0821 05/21/22 0959   05/16/22 1000  azithromycin (ZITHROMAX) 500 mg in sodium chloride 0.9 % 250 mL IVPB        500 mg 250 mL/hr over 60 Minutes Intravenous Every 24 hours 05/16/22 0821 05/21/22 0959   05/10/22 2200  ceFAZolin (ANCEF) IVPB 2g/100 mL premix        2 g 200 mL/hr over 30 Minutes Intravenous Every 6 hours 05/10/22 1323 05/11/22 1317   05/10/22 1000  vancomycin (VANCOCIN) IVPB 1000 mg/200 mL premix        1,000 mg 200 mL/hr over 60 Minutes Intravenous  Once 05/10/22 0909 05/10/22 1055   05/10/22 0913  vancomycin (VANCOCIN) 1-5 GM/200ML-% IVPB       Note to Pharmacy: Miranda Roman M: cabinet override      05/10/22 0913 05/10/22 1005         Subjective: Laying in bed.  Denies any chest pain.  Denies any significant shortness of breath.  No abdominal pain today.  Noted to have abdominal pain overnight which patient states is currently improved.  Daughter at bedside.   Was noted by RN that patient was in right meals with a blood pressure 141/58, temperature of 103.1, heart rate of 126, respiratory rate of 26 initially 83% on  room air placed on 2 L with 98% sats.   Objective: Vitals:   05/15/22 2114 05/16/22 0454 05/16/22 0901 05/16/22 1008  BP: 136/72 (!) 138/57 (!) 141/58 (!) 101/54  Pulse: (!) 104 (!) 110 (!) 126 (!) 104  Resp: 17 17 (!) 26 (!) 27  Temp: 100.1 F (37.8 C) 100.1 F (37.8 C) (!) 103.1 F (39.5 C) 100.2 F (37.9 C)  TempSrc: Oral  Oral Oral  SpO2: 91% 91% 98% 100%  Weight:      Height:        Intake/Output Summary (Last 24 hours) at 05/16/2022 1055 Last data filed at 05/16/2022 0900 Gross per 24 hour  Intake 700 ml  Output 1140 ml  Net -440 ml   Filed Weights   05/08/22 2300  Weight: 78.2 kg    Examination:  General exam: NAD Respiratory system: Some coarse bibasilar breath sounds.  No wheezing.  Fair air movement.  Speaking in Roman sentences.  No use of accessory muscles of respiration.  Cardiovascular system: RRR no murmurs rubs or gallops.  No JVD.  No lower extremity edema.  Gastrointestinal system: Abdomen is soft, nontender, nondistended, positive bowel sounds.  No rebound.  No  guarding.  Central nervous system: Alert and oriented. No focal neurological deficits.  Moving extremities spontaneously. Extremities: Right lower extremity in postop dressing.  Right upper extremity in splint.  Skin: No rashes, lesions or ulcers Psychiatry: Judgement and insight appear fair.. Mood & affect appropriate.     Data Reviewed: I have personally reviewed following labs and imaging studies  CBC: Recent Labs  Lab 05/12/22 0327 05/13/22 0342 05/14/22 0719 05/15/22 1035 05/16/22 0920  WBC 11.4* 8.5 9.5 14.7* 23.2*  NEUTROABS  --   --   --   --  17.9*  HGB 7.9* 8.1* 8.1* 9.0* 7.1*  HCT 24.5* 25.2* 24.5* 27.9* 21.9*  MCV 73.1* 73.7* 72.1* 74.2* 74.5*  PLT 109* 133* 147* 177 782    Basic Metabolic Panel: Recent Labs  Lab 05/12/22 0327 05/13/22 0342 05/14/22 0343 05/15/22 1035 05/16/22 0304  NA 139 140 141 141 141  K 4.4 4.6 4.4 4.4 5.1  CL 109 111 111 112* 111  CO2  _0 21* 19*  GLUCOSE 137* 130* 165* 239* 206*  BUN 51* 48* 48* 53* 56*  CREATININE 2.36* 2.64* 2.87* 2.86* 3.09*  CALCIUM 8.3* 8.1* 8.2* 8.4* 8.2*  MG  --   --   --  2.1  --   PHOS  --   --   --   --  4.4    GFR: Estimated Creatinine Clearance: 16.2 mL/min (A) (by C-G formula based on SCr of 3.09 mg/dL (H)).  Liver Function Tests: Recent Labs  Lab 05/10/22 0315 05/11/22 0310 05/12/22 0327 05/13/22 0342 05/14/22 0343 05/16/22 0304  AST _1 --   ALT 33 21 7 <5 6  --   ALKPHOS 65 53 55 55 53  --   BILITOT 0.7 0.5 0.7 0.6 0.5  --   PROT 6.0* 5.7* 5.6* 5.7* 5.6*  --   ALBUMIN 3.2* 2.7* 2.7* 2.7* 2.7* 2.7*    CBG: Recent Labs  Lab 05/15/22 0807 05/15/22 1300 05/15/22 1725 05/15/22 2113 05/16/22 0748  GLUCAP 191* 273* 246* 266* 197*     Recent Results (from the past 240 hour(s))  Surgical pcr screen     Status: None   Collection Time: 05/10/22  8:53 AM   Specimen: Nasal Mucosa; Nasal Swab  Result Value Ref Range Status   MRSA, PCR NEGATIVE NEGATIVE Final   Staphylococcus aureus NEGATIVE NEGATIVE Final    Comment: (NOTE) The Xpert SA Assay (FDA approved for NASAL specimens in patients 75 years of age and older), is one component of a comprehensive surveillance program. It is not intended to diagnose infection nor to guide or monitor treatment. Performed at [facility], [place] [address], [place], [place] [address]          Radiology Studies: CT ABDOMEN PELVIS WO CONTRAST  Result Date: 05/16/2022 CLINICAL DATA:  Abdominal pain and spasms, elevated white blood count. EXAM: CT ABDOMEN AND PELVIS WITHOUT CONTRAST TECHNIQUE: Multidetector CT imaging of the abdomen and pelvis was performed following the standard protocol without IV contrast. RADIATION DOSE REDUCTION: This exam was performed according to the departmental dose-optimization program which includes automated exposure control, adjustment of the mA and/or kV according to  patient size and/or use of iterative reconstruction technique. COMPARISON:  04/09/2020, ninth 09/24/2018. FINDINGS: Lower chest: The heart is normal in size and there is no pericardial effusion. Scattered coronary artery calcifications are noted patchy infiltrates are noted at the lung bases bilaterally. Hepatobiliary: No focal liver abnormality  is seen. No biliary ductal dilatation. Stones are present within the gallbladder. Pancreas: A 9 mm hypodensity is noted in the tail of the pancreas, possible cyst. No pancreatic ductal dilatation or surrounding inflammatory changes. Spleen: Normal in size without focal abnormality. Adrenals/Urinary Tract: The adrenal glands are within normal limits. There is a cyst in the mid right kidney no renal calculus or hydronephrosis. The bladder is unremarkable. 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 Appendix appears normal. No evidence of bowel wall thickening, distention, or inflammatory changes. No free air or pneumatosis. Scattered diverticula are present along the colon without evidence of diverticulitis. Vascular/Lymphatic: Aortic atherosclerosis. No enlarged abdominal or pelvic lymph nodes. Reproductive: Uterus and bilateral adnexa are unremarkable. Other: No abdominopelvic ascites. Scattered foci of air are noted in the anterior abdominal wall which may be iatrogenic. 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 IMPRESSION: 1. Patchy infiltrates at the lung bases, concerning for pneumonia. 2. Cholelithiasis. 3. 9 mm cyst in the tail of the pancreas and essentially unchanged from 2020. 4. Diverticulosis without diverticulitis. 5. Aortic atherosclerosis and coronary artery calcifications. Electronically Signed   By: Brett Fairy M.D.    On: 05/16/2022 02:26   DG CHEST PORT 1 VIEW  Result Date: 05/15/2022 CLINICAL DATA:  Leukocytosis EXAM: PORTABLE CHEST 1 VIEW COMPARISON:  Chest radiograph dated 04/25/2020 FINDINGS: Lines/tubes: None. Chest: Low lung volumes.  Patchy bibasilar opacities. Pleura: Blunting of the bilateral costophrenic angles. No pneumothorax. Heart/mediastinum: The heart size and mediastinal contours are within normal limits. Bones: No acute osseous abnormality. Curvilinear radiodensity in the left upper quadrant may reflect costochondral calcifications. IMPRESSION: 1. Low lung volumes with patchy bibasilar opacities which may represent atelectasis, aspiration, or pneumonia. 2. Small bilateral pleural effusions. Electronically Signed   By: Darrin Nipper M.D.   On: 05/15/2022 12:20        Scheduled Meds:  amLODipine  2.5 mg Oral Daily   atorvastatin  20 mg Oral Daily   calcitRIOL  0.25 mcg Oral Daily   cyanocobalamin  1,000 mcg Oral Daily   docusate sodium  100 mg Oral BID   enoxaparin (LOVENOX) injection  30 mg Subcutaneous Q24H   gabapentin  100 mg Oral QHS   insulin aspart  0-15 Units Subcutaneous TID WC   insulin aspart  0-5 Units Subcutaneous QHS   insulin detemir  10 Units Subcutaneous Daily   loratadine  10 mg Oral QHS   metoprolol tartrate  50 mg Oral BID   senna  1 tablet Oral BID   sodium bicarbonate  650 mg Oral BID   sodium phosphate  1 enema Rectal Once   Continuous Infusions:  sodium chloride Stopped (05/10/22 1825)   sodium chloride 75 mL/hr at 05/16/22 0847   azithromycin     cefTRIAXone (ROCEPHIN)  IV 2 g (05/16/22 0851)   methocarbamol (ROBAXIN) IV       LOS: 8 days    Time spent: 40 minutes    Irine Seal, MD [facility]   To contact the attending provider between 7A-7P or the covering provider during after hours 7P-7A, please log into the web site [email or web address] and access using universal [place] password for that web site. If you do not have the password,  please call the hospital operator.  05/16/2022, 10:55  AM

## 2022-05-16 NOTE — Progress Notes (Signed)
PT Cancellation Note  Patient Details Name: TAILA BASINSKI MRN: [id number] DOB: December 14, 1945   Cancelled Treatment:     Rapid Response called to room this morning due to RED MEWS elevated temp 103.1 and tachycardia.  Will need to hold off on her Physical Therapy today as she is also scheduled to receive blood.    Rica Koyanagi  PTA Acute  Rehabilitation Services Office M-F          [phone] Weekend pager [phone]

## 2022-05-16 NOTE — Progress Notes (Signed)
Attempted to call 4th floor to give report. 4th floor and charge RN looking over the patient's chart now. Left them my callback number for them to call back when ready for report.

## 2022-05-16 NOTE — Progress Notes (Signed)
Called report to Illene Labrador, RN on 4th floor.

## 2022-05-16 NOTE — Progress Notes (Signed)
05/16/22 0901  Assess: MEWS Score  Temp (!) 103.1 F (39.5 C)  BP (!) 141/58  MAP (mmHg) 82  Pulse Rate (!) 126  Resp (!) 26  Level of Consciousness Alert  SpO2 98 %  O2 Device Nasal Cannula  O2 Flow Rate (L/min) 4 L/min  Assess: MEWS Score  MEWS Temp 2  MEWS Systolic 0  MEWS Pulse 2  MEWS RR 2  MEWS LOC 0  MEWS Score 6  MEWS Score Color Red  Assess: if the MEWS score is Yellow or Red  Were vital signs taken at a resting state? Yes  Focused Assessment Change from prior assessment (see assessment flowsheet)  Does the patient meet 2 or more of the SIRS criteria? Yes  Does the patient have a confirmed or suspected source of infection? Yes  Provider and Rapid Response Notified? Yes (Dr. Irine Seal, MD and Darrick Meigs, Rapid Response RN notified at 9:05 AM)  MEWS guidelines implemented *See Row Information* Yes  Treat  MEWS Interventions Administered prn meds/treatments;Other (Comment) (Administered Tylenol at 0908 on 05/16/22, removed extra blankets, and notified Dr. Grandville Silos, MD of patient's Red MEWS and requested pt be on tele. Put pt on tele at 9:15 AM (per Dr. Grandville Silos, MD order).)  Take Vital Signs  Increase Vital Sign Frequency  Red: Q 1hr X 4 then Q 4hr X 4, if remains red, continue Q 4hrs  Escalate  MEWS: Escalate Red: discuss with charge nurse/RN and provider, consider discussing with RRT  Notify: Charge Nurse/RN  Name of Charge Nurse/RN Notified Blima Dessert, RN  Date Charge Nurse/RN Notified 05/16/22  Time Charge Nurse/RN Notified 0902  Notify: Provider  Provider Name/Title Dr. Irine Seal, MD  Date Provider Notified 05/16/22  Time Provider Notified [phone]  Method of Notification Page  Notification Reason Change in status (Elevated temp and heart rate.)  Provider response See new orders (Cardiac monitoring/tele and EKG.)  Date of Provider Response 05/16/22  Time of Provider Response 0910  Notify: Rapid Response  Name of Rapid Response RN Notified  Christian, Rapid Response RN  Date Rapid Response Notified 05/16/22  Time Rapid Response Notified 5894  Assess: SIRS CRITERIA  SIRS Temperature  1  SIRS Pulse 1  SIRS Respirations  1  SIRS WBC 1  SIRS Score Sum  4

## 2022-05-16 NOTE — Progress Notes (Signed)
MEWS score now green.

## 2022-05-16 NOTE — Evaluation (Signed)
SLP Cancellation Note  Patient Details Name: KORA GROOM MRN: [id number] DOB: 1945/08/13   Cancelled treatment:       Reason Eval/Treat Not Completed: Other (comment);Medical issues which prohibited therapy (RN, Colletta [place], reports rapid response called at htis time; will continue efforts)  Kathleen Lime, MS [facility] SLP [place] [phone] Pager [phone]  Macario Golds 05/16/2022, 9:32 AM

## 2022-05-16 NOTE — Progress Notes (Addendum)
CROSS COVER NOTE  NAME: Miranda Roman MRN: [id number] DOB : [birth year]    Date of Service   05/16/2022   HPI/Events of Note   Notified by bedside RN of patient complaining of new abdominal pain.  During bedside assessment patient is alert and oriented x4.  She is currently moaning and states that her RUQ abdomen is where she feels the most pain.  Although she is oriented, she is easily distracted and has a hard time describing or rating her pain.  Patient denies any nausea, emesis, or diarrhea. Active bowel sounds in all 4 quadrants.  Abdomen is slightly distended, but not rigid, nor is there any rebound tenderness noted.  Patient continues to moan while I am palpating all 4 quadrants of her abdomen.    Last bowel movement was on 11/9. No complications reported. CT of abdomen without contrast ordered.  0300-CT abdomen shows cholelithiasis without biliary ductal dilation or inflammation. A 9- mm cyst in the tail of the pancreas (unchanged from 2020) with no pancreatic ductal dilation or surrounding inflammatory changes.  Diverticulosis without diverticulitis.  A new small hiatal hernia is noted.   No current abd obstruction of inflammation noted in scan.    Interventions/ Plan   CT abd/ pelvis        Raenette Rover, DNP, [place]

## 2022-05-17 DIAGNOSIS — D62 Acute posthemorrhagic anemia: Secondary | ICD-10-CM

## 2022-05-17 DIAGNOSIS — I10 Essential (primary) hypertension: Secondary | ICD-10-CM | POA: Diagnosis not present

## 2022-05-17 DIAGNOSIS — S82201A Unspecified fracture of shaft of right tibia, initial encounter for closed fracture: Secondary | ICD-10-CM | POA: Diagnosis not present

## 2022-05-17 DIAGNOSIS — N179 Acute kidney failure, unspecified: Secondary | ICD-10-CM | POA: Diagnosis not present

## 2022-05-17 DIAGNOSIS — S52501K Unspecified fracture of the lower end of right radius, subsequent encounter for closed fracture with nonunion: Secondary | ICD-10-CM | POA: Diagnosis not present

## 2022-05-17 LAB — CBC WITH DIFFERENTIAL/PLATELET
Abs Immature Granulocytes: 0.26 10*3/uL — ABNORMAL HIGH (ref 0.00–0.07)
Basophils Absolute: 0.1 10*3/uL (ref 0.0–0.1)
Basophils Relative: 0 %
Eosinophils Absolute: 0.1 10*3/uL (ref 0.0–0.5)
Eosinophils Relative: 1 %
HCT: 29.8 % — ABNORMAL LOW (ref 36.0–46.0)
Hemoglobin: 9.9 g/dL — ABNORMAL LOW (ref 12.0–15.0)
Immature Granulocytes: 1 %
Lymphocytes Relative: 9 %
Lymphs Abs: 1.9 10*3/uL (ref 0.7–4.0)
MCH: 26.1 pg (ref 26.0–34.0)
MCHC: 33.2 g/dL (ref 30.0–36.0)
MCV: 78.6 fL — ABNORMAL LOW (ref 80.0–100.0)
Monocytes Absolute: 2.2 10*3/uL — ABNORMAL HIGH (ref 0.1–1.0)
Monocytes Relative: 11 %
Neutro Abs: 15.5 10*3/uL — ABNORMAL HIGH (ref 1.7–7.7)
Neutrophils Relative %: 78 %
Platelets: 164 10*3/uL (ref 150–400)
RBC: 3.79 MIL/uL — ABNORMAL LOW (ref 3.87–5.11)
RDW: 17.4 % — ABNORMAL HIGH (ref 11.5–15.5)
WBC: 20 10*3/uL — ABNORMAL HIGH (ref 4.0–10.5)
nRBC: 0 % (ref 0.0–0.2)

## 2022-05-17 LAB — RENAL FUNCTION PANEL
Albumin: 2.2 g/dL — ABNORMAL LOW (ref 3.5–5.0)
Anion gap: 7 (ref 5–15)
BUN: 52 mg/dL — ABNORMAL HIGH (ref 8–23)
CO2: 21 mmol/L — ABNORMAL LOW (ref 22–32)
Calcium: 8 mg/dL — ABNORMAL LOW (ref 8.9–10.3)
Chloride: 116 mmol/L — ABNORMAL HIGH (ref 98–111)
Creatinine, Ser: 3.57 mg/dL — ABNORMAL HIGH (ref 0.44–1.00)
GFR, Estimated: 13 mL/min — ABNORMAL LOW (ref >60)
Glucose, Bld: 149 mg/dL — ABNORMAL HIGH (ref 70–99)
Phosphorus: 5.4 mg/dL — ABNORMAL HIGH (ref 2.5–4.6)
Potassium: 4.4 mmol/L (ref 3.5–5.1)
Sodium: 144 mmol/L (ref 135–145)

## 2022-05-17 LAB — GLUCOSE, CAPILLARY
Glucose-Capillary: 159 mg/dL — ABNORMAL HIGH (ref 70–99)
Glucose-Capillary: 220 mg/dL — ABNORMAL HIGH (ref 70–99)
Glucose-Capillary: 164 mg/dL — ABNORMAL HIGH (ref 70–99)
Glucose-Capillary: 158 mg/dL — ABNORMAL HIGH (ref 70–99)

## 2022-05-17 LAB — URINALYSIS, ROUTINE W REFLEX MICROSCOPIC
Bacteria, UA: NONE SEEN
Bilirubin Urine: NEGATIVE
Glucose, UA: NEGATIVE mg/dL
Ketones, ur: NEGATIVE mg/dL
Leukocytes,Ua: NEGATIVE
Nitrite: NEGATIVE
Protein, ur: 30 mg/dL — AB
Specific Gravity, Urine: 1.009 (ref 1.005–1.030)
pH: 6 (ref 5.0–8.0)

## 2022-05-17 LAB — STREP PNEUMONIAE URINARY ANTIGEN: Strep Pneumo Urinary Antigen: NEGATIVE

## 2022-05-17 LAB — CREATININE, URINE, RANDOM: Creatinine, Urine: 41 mg/dL

## 2022-05-17 LAB — SODIUM, URINE, RANDOM: Sodium, Ur: 72 mmol/L

## 2022-05-17 MED ORDER — HYDROMORPHONE HCL 1 MG/ML IJ SOLN
0.5000 mg | INTRAMUSCULAR | Status: DC | PRN
  Administered 2022-05-17 – 2022-05-18 (×2): 0.5 mg via INTRAVENOUS
  Filled 2022-05-17 (×2): qty 0.5

## 2022-05-17 MED ORDER — METOPROLOL TARTRATE 25 MG PO TABS
25.0000 mg | ORAL_TABLET | Freq: Two times a day (BID) | ORAL | Status: DC
  Administered 2022-05-17 – 2022-05-19 (×4): 25 mg via ORAL
  Filled 2022-05-17 (×4): qty 1

## 2022-05-17 NOTE — Consult Note (Addendum)
Renal Service Consult Note Miranda Roman  Miranda Roman 05/17/2022  D , MD Requesting Physician: D. Thompson, MD  Reason for Consult: Renal failure HPI: The patient is a 76 y.o. year-old w/ hx as below who presented to ED after a fall on 11/2. Xrays showed fracture of R tib/ fib. Pt went for ORIF on 11/4 per orthopedics. 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 Creat increased up to 3.0 yest and 3.5 today. CT abd done yest was negative for obstruction. We are asked to see for AKI on CKD 4.    Pt seen in room, no c/o's today. Pain in the leg is improving some. Is f/b Dr Foster w/ [facility] for her CKD stage 4.    ROS - denies CP, no joint pain, no HA, no blurry vision, no rash, no diarrhea, no nausea/ vomiting, no dysuria, no difficulty voiding   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   Borderline steroid-induced glaucoma, 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   OD, Early elevated intraocular pressure to 28 mm we will discontinue all topical medications from surgery in the right eye today   Bronchiectasis without complication 02/04/2021   Cataract 07/03/2020   Chest pain at rest 01/30/2015   Chronic kidney disease, stage 4 (severe) 12/07/2020   Closed fracture of right tibial plateau 07/17/2021   Cyst of left kidney 03/10/2019   Needs repeated US 08/2019   Dysphagia, neurologic    Elevated d-dimer 12/20/2019    Facet arthritis of lumbar region 02/27/2019   Gastroesophageal reflux disease 08/26/2016   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  Further history patient suffered possible Wernicke's encephalopathy the past, a type of the vitamin deficiencies, thiamine that can also affect the retina if there is significant damage which may not be recoverable.   Mild neurocognitive disorder due to multiple etiologies 01/23/2022   Osteoporosis 12/15/2018   Overweight with body mass index (BMI) 25.0-29.9 12/15/2018   PAD (peripheral artery disease) 06/06/2019   Pain in right hand 04/27/2020   Pancreatic cyst 06/06/2019   Polyneuropathy due to type 2 diabetes mellitus    Posterior vitreous detachment of left eye 08/05/2021   Proliferative diabetic retinopathy of right eye 08/22/2021   Likely contributory cause of nonclearing vitreous hemorrhage in the right eye now status post PRP likely to resolve in 2 quiescent PDR, post vitrectomy PRP   Pulmonary hypertension 02/04/2021   Right knee pain 07/10/2021   Sickle-cell trait 12/07/2020   Type II diabetes mellitus   03/10/2012   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   Vitreous hemorrhage of right eye 08/05/2021   Vitrectomy PRP 08-14-2021   Past Surgical History  Past Surgical History:  Procedure Laterality Date   BONE RESECTION  01/2006   wrist   GASTRIC BYPASS  08/14/2009   OPEN REDUCTION INTERNAL FIXATION (ORIF) TIBIA/FIBULA FRACTURE Right 05/10/2022    Procedure: OPEN REDUCTION INTERNAL FIXATION (ORIF) TIBIA/FIBULA FRACTURE;  Surgeon: Marchia Bond, MD;  Location: [facility];  Service: Orthopedics;  Laterality: Right;   OTHER SURGICAL HISTORY     sickle cell retinopathy laser surgery   TUBAL LIGATION  04/28/1977   Family History  Family History  Problem Relation Age of Onset   Diabetes Mother    Heart disease Mother    Hypertension Mother    Stroke Mother    Diabetes Father    Heart disease Father    Diabetes Sister    Diabetes Brother    Hypertension Brother    Sickle cell anemia Brother    Diabetes Maternal Aunt    Diabetes Maternal Uncle    Diabetes Maternal Grandmother    Diabetes Maternal Grandfather    Sickle cell anemia Sister    Sickle cell anemia Sister    Healthy Son    Healthy Son    Healthy Daughter    Social History  reports that she quit smoking about 17 years ago. Her smoking use included cigarettes. She started smoking about 60 years ago. She has a 10.75 pack-year smoking history. She has never used smokeless tobacco. She reports that she does not currently use alcohol. She reports that she does not use drugs. Allergies  Allergies  Allergen Reactions   Cefepime Other (See Comments)    Causes encephalitis - reported by [facility] 12/31/2019   Lactose Intolerance (Gi)     Per pt's daughter causes gas   Home medications Prior to Admission medications   Medication Sig Start Date End Date Taking? Authorizing Provider  acetaminophen (TYLENOL) 500 MG tablet Take 1 tablet (500 mg total) by mouth every 6 (six) hours as needed for moderate pain or fever. 02/14/20  Yes Oswald Hillock, MD  amLODipine (NORVASC) 2.5 MG tablet Take 2.5 mg by mouth daily. 09/13/20  Yes [provider]  atorvastatin (LIPITOR) 20 MG tablet TAKE ONE TABLET BY MOUTH MONDAY THRU FRIDAY AND SKIP WEEKENDS Patient taking differently: Take 20 mg by mouth daily. 09/30/21  Yes Glendale Chard, MD  AZO-CRANBERRY PO Take 1 capsule by mouth daily  at 6 (six) AM.   Yes [provider]  b complex vitamins capsule Take 1 capsule by mouth daily.   Yes [provider]  calcitRIOL (ROCALTROL) 0.25 MCG capsule Take 0.25 mcg by mouth daily. 08/27/20  Yes [provider]  cetirizine (ZYRTEC) 10 MG tablet Take 10 mg by mouth daily.   Yes [provider]  Cyanocobalamin (VITAMIN B12) 1000 MCG TBCR Take 1 tablet by mouth daily.   Yes [provider]  diclofenac Sodium (VOLTAREN) 1 % GEL Apply 2 g topically 4 (four) times daily. Apply to left knee. 09/12/21  Yes Minette Brine, FNP  gabapentin (NEURONTIN) 100 MG capsule TAKE 1 CAPSULE BY MOUTH AT BEDTIME Patient taking differently: Take 100 mg by mouth at bedtime. 02/24/22  Yes Glendale Chard, MD  HUMALOG KWIKPEN 100 UNIT/ML KwikPen INJECT SUBCUTANEOUSLY 4 UNITS 3  TIMES DAILY WITH MEALS Patient taking differently: Inject 4 Units into the skin daily. 04/21/22  Yes  Sanders, Robyn, MD  insulin detemir (LEVEMIR FLEXTOUCH) 100 UNIT/ML FlexPen Inject 6 Units into the skin daily. 04/05/22  Yes Sanders, Robyn, MD  LEVEMIR 100 UNIT/ML injection INJECT 0.12 MLS (12 UNITS TOTAL) INTO THE SKIN 2 (TWO) TIMES DAILY. Patient taking differently: Inject 6 Units into the skin daily. 01/12/22  Yes Sanders, Robyn, MD  metoprolol tartrate (LOPRESSOR) 50 MG tablet TAKE 1 TABLET BY MOUTH TWICE A DAY Patient taking differently: Take 50 mg by mouth 2 (two) times daily. 01/06/22  Yes Sanders, Robyn, MD  Multiple Vitamins-Minerals (CENTRUM SILVER PO) Take by mouth daily.   Yes [provider]  sodium bicarbonate 650 MG tablet Take 650 mg by mouth 2 (two) times daily. 08/23/20  Yes [provider]  BD INSULIN SYRINGE U/F 31G X 5/16" 0.3 ML MISC See admin instructions. use with insulin 03/17/20   [provider]  epoetin alfa (PROCRIT) 10000 UNIT/ML injection Every other week Patient not taking: Reported on 05/08/2022    [provider]  Insulin Pen Needle (BD  PEN NEEDLE NANO U/F) 32G X 4 MM MISC Use as directed with insulin pen 04/21/22   Sanders, Robyn, MD  OneTouch Delica Lancets 30G MISC See admin instructions. 02/09/21   [provider]     Vitals:   05/16/22 2326 05/16/22 2330 05/17/22 0021 05/17/22 0516  BP:   (!) 104/53 122/62  Pulse:   90 98  Resp:   20 18  Temp: (!) 101 F (38.3 C) (!) 100.4 F (38 C) 98.9 F (37.2 C) (!) 100.5 F (38.1 C)  TempSrc: Oral Oral Oral Oral  SpO2:   95% 95%  Weight:      Height:       Exam Gen alert, no distress, elderly AAF no distress No rash, cyanosis or gangrene Sclera anicteric, throat clear  No jvd or bruits Chest clear bilat to bases, no rales/ wheezing RRR no MRG Abd soft ntnd no mass or ascites +bs GU defer MS no joint effusions or deformity Ext no LE or UE edema, no wounds or ulcers Neuro is alert, Ox 3 , nf    Home meds include - amlodipine 2.5 qd, atorvastatin, gabapentin, humalog/ levemir insulin, metoprolol 50 bid, MVI, sod bicarb 1 bid, prns/ vits/ supps    Date   Creat  eGFR   2019- 2020  2.44- 2.84   March 2021  2.36   Jul- aug 2021 1.44-  2.05 May 2020  1.91- 2.30   Jun- nov 2022 2.30     Feb 2023  2.59  19 ml/min     July 2023  2.43  20 ml/min   Sept 2023  2.60    11/02   2.60  19 ml/min    11/04   3.07     11/06  2.36     11/08  2.87       11/10  3.09  15 ml/min     11/11  3.57  13 ml/min       B/l creatinine 2.43- 2.60, in 2023 feb - sept, eGFR 19- 20 ml/min   UA 11/9 - 100 prot, 0-5 rbc, 6-10 wbc, rare bact    CT abd w/o contrast 11/10 - Adrenals/Urinary Tract: The adrenal glands are within normal limits. There is a cyst in the mid right kidney no renal calculus or hydronephrosis. The bladder is unremarkable.    Creat 2.7 admit, then up to 3.0 then down to 2.3, then 2.8 > 3.0 >   3.5 last 3 days including today     UOP very good 11/3- 11/9 > 1800 cc daily, 1100 cc yest and 600 cc today so far     I/O's - 8.7 L in and 14.8 L out    BP's normal since  admit 110- 150 SBP, no BP's lately less than 100    New fevers 11/09- 11/10 up to 103deg F, new ^WBC to 20K today     IV abx Vancomycin 11/4 --> dc'd               Cefazolin  11/04- 11/05 dc'd    Rocephin  11/10- current       zithromax  11/10- current     NO IV contrast, acei/ ARB, no nsaids     Only 1 day of IV vanc      Assessment/ Plan: AKI on CKD 4 - B/l creatinine 2.43- 2.60, in 2023 feb - sept, eGFR 19- 20 ml/min. Pt is f/b Dr Royce Macadamia w/ [facility]. Creat here was 2.6 on admission in setting of fall and tib/ fib fracture. Creat was stable for several days until last 48 hrs when pt developed high fevers w/ suspected PNA. BP slightly lower in the 100-120 range since fevers started. Creat ^'d up to 3.09 yesterday and 3.57 today. CT showed no obstruction and UA from 11/09 was unremarkable. Has only rec'd a few days of IV abx, and no IV contrast or nsaids/ acei/ arbs. Suspect AKI is due to new infection w/ cap leak and/or relative hypoperfusion. Will ^IVF's to 50 cc/hr for now, no signs of uremia. Will follow.  HTN - have dc'd / lowered BP lowering medications, let BP come up Fall w/ R tib/ fib fracture - s/p ORIF 11/4 CAP - w/ low sats, temp 103, getting IV rocephin/ zithromax.  DM2 - getting insulin here. Per pmd.       Kelly Splinter  MD 05/17/2022, 12:19 PM Recent Labs  Lab 05/16/22 0304 05/16/22 0920 05/16/22 1551 05/17/22 0522  HGB  --    < > 8.0* 9.9*  ALBUMIN 2.7*  --   --  2.2*  CALCIUM 8.2*  --   --  8.0*  PHOS 4.4  --   --  5.4*  CREATININE 3.09*  --   --  3.57*  K 5.1  --   --  4.4   < > = values in this interval not displayed.   Inpatient medications:  amLODipine  2.5 mg Oral Daily   atorvastatin  20 mg Oral Daily   calcitRIOL  0.25 mcg Oral Daily   cyanocobalamin  1,000 mcg Oral Daily   docusate sodium  100 mg Oral BID   enoxaparin (LOVENOX) injection  30 mg Subcutaneous Q24H   gabapentin  100 mg Oral QHS   insulin aspart  0-15 Units Subcutaneous TID WC   insulin  aspart  0-5 Units Subcutaneous QHS   insulin detemir  10 Units Subcutaneous Daily   loratadine  10 mg Oral QHS   metoprolol tartrate  50 mg Oral BID   senna  1 tablet Oral BID   sodium bicarbonate  650 mg Oral BID   sodium phosphate  1 enema Rectal Once    sodium chloride Stopped (05/10/22 1825)   sodium chloride 50 mL/hr at 05/17/22 0023   azithromycin 500 mg (05/17/22 1158)   cefTRIAXone (ROCEPHIN)  IV 2 g (05/17/22 0842)   methocarbamol (ROBAXIN) IV     sodium chloride, acetaminophen, bisacodyl, diphenhydrAMINE, magnesium  citrate, methocarbamol **OR** methocarbamol (ROBAXIN) IV, metoCLOPramide **OR** metoCLOPramide (REGLAN) injection, morphine injection, ondansetron **OR** ondansetron (ZOFRAN) IV, polyethylene glycol, traMADol   

## 2022-05-17 NOTE — Evaluation (Signed)
Speech Language Pathology Evaluation Patient Details Name: Miranda Roman MRN: [id number] DOB: Nov 22, 1945 Today's Date: 05/17/2022 Time: 9030-0923 SLP Time Calculation (min) (ACUTE ONLY): 20 min  Problem List:  Patient Active Problem List   Diagnosis Date Noted   PNA (pneumonia) 05/16/2022   Acute renal failure superimposed on stage 4 chronic kidney disease ([place]) 05/14/2022   Hypertension 05/14/2022   Fracture 05/08/2022   Tibia/fibula fracture, right, closed, initial encounter 05/08/2022   Right radial fracture 05/08/2022   Right distal ulnar fracture 05/08/2022   Left wrist pain 05/08/2022   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   Mild neurocognitive disorder due to multiple etiologies 01/23/2022   Polyneuropathy due to type 2 diabetes mellitus    Anemia of chronic renal failure, stage 4 (severe) 01/04/2022   Proliferative diabetic retinopathy of right eye 08/22/2021   Borderline steroid-induced glaucoma, 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   Macular atrophy, retinal 08/22/2021   Posterior vitreous detachment of left eye 08/05/2021   Vitreous hemorrhage of right eye 08/05/2021   Right knee pain 07/10/2021   Pulmonary hypertension 02/04/2021   Bronchiectasis without complication 30/01/6225   Chronic kidney disease, stage 4 (severe) 12/07/2020   Hyperglycemia due to type 2 diabetes mellitus 12/07/2020   Long term (current) use of insulin 12/07/2020   Sickle-cell trait 12/07/2020   Cataract 07/03/2020   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   Pain in right hand 04/27/2020   Dysphagia, neurologic    Inadequate oral nutritional intake    Altered mental status    Leukocytosis 12/22/2019   Elevated d-dimer 12/20/2019   PAD (peripheral artery disease) 06/06/2019   Pancreatic cyst 06/06/2019   Cyst of left kidney 03/10/2019   Lumbago 02/27/2019   Facet arthritis of lumbar region 02/27/2019   Abnormal CT of the chest 12/28/2018   Atrophic vaginitis 12/15/2018   Atypical squamous cells of undetermined  significance on cytologic smear of cervix (ASC-US) 12/15/2018   Overweight with body mass index (BMI) 25.0-29.9 12/15/2018   Low grade squamous intraepithelial lesion (LGSIL) on cervicovaginal cytologic smear 12/15/2018   Osteoporosis 12/15/2018   Unspecified dyspareunia (CODE) 12/15/2018   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   Hypertensive nephropathy 10/15/2017   Gastroesophageal reflux disease 08/26/2016   Aortic valve regurgitation 02/11/2016   Chest pain at rest 01/30/2015   Atherosclerosis of native coronary artery of native heart without angina pectoris 12/24/2012   Benign essential hypertension 12/24/2012   Hyperlipidemia 12/24/2012   Type II diabetes mellitus 03/10/2012   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   Past Medical History:  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   Borderline steroid-induced glaucoma, 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   OD, Early elevated intraocular pressure to 28 mm we will discontinue all topical medications from surgery in the right eye today   Bronchiectasis without complication 33/35/4562   Cataract 07/03/2020   Chest pain at rest 01/30/2015   Chronic kidney disease, stage 4 (severe) 12/07/2020   Closed fracture of right tibial plateau 07/17/2021   Cyst of left kidney 03/10/2019  Needs repeated US 08/2019   Dysphagia, neurologic    Elevated d-dimer 12/20/2019   Facet arthritis of lumbar region 02/27/2019    Gastroesophageal reflux disease 08/26/2016   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  Further history patient suffered possible Wernicke's encephalopathy the past, a type of the vitamin deficiencies, thiamine that can also affect the retina if there is significant damage which may not be recoverable.   Mild neurocognitive disorder due to multiple etiologies 01/23/2022   Osteoporosis 12/15/2018   Overweight with body mass index (BMI) 25.0-29.9 12/15/2018   PAD (peripheral artery disease) 06/06/2019   Pain in right hand 04/27/2020   Pancreatic cyst 06/06/2019   Polyneuropathy due to type 2 diabetes mellitus    Posterior vitreous detachment of left eye 08/05/2021   Proliferative diabetic retinopathy of right eye 08/22/2021   Likely contributory cause of nonclearing vitreous hemorrhage in the right eye now status post PRP likely to resolve in 2 quiescent PDR, post vitrectomy PRP   Pulmonary hypertension 02/04/2021   Right knee pain 07/10/2021   Sickle-cell trait 12/07/2020   Type II diabetes mellitus 03/10/2012   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   Vitreous hemorrhage of right eye 08/05/2021   Vitrectomy PRP 08-14-2021   Past Surgical History:  Past Surgical History:  Procedure Laterality Date   BONE RESECTION  01/2006   wrist   GASTRIC BYPASS  08/14/2009   OPEN REDUCTION INTERNAL FIXATION (ORIF) TIBIA/FIBULA FRACTURE Right 05/10/2022   Procedure: OPEN REDUCTION INTERNAL FIXATION (ORIF)  TIBIA/FIBULA FRACTURE;  Surgeon: Marchia Bond, MD;  Location: [facility];  Service: Orthopedics;  Laterality: Right;   OTHER SURGICAL HISTORY     sickle cell retinopathy laser surgery   TUBAL LIGATION  04/28/1977   HPI:  76 yo female admitted with R proximal tibia/fibula fx, R distal radius/ulna fx. S/P IM nailing R tibia 11/4. Non-operative management of R wrist fx-splinted. Hx of mild neurocognitive d/o, osteoporosis, pulm HTN, DM, CKD.  CXR remarkable for " Low lung volumes with patchy bibasilar opacities which may represent atelectasis, aspiration, or pneumonia."   Assessment / Plan / Recommendation Clinical Impression  Pt was seen for a cognitive-linguistic evaluation and she presents with cognitive deficits in the areas of sustained attention, short-term memory, executive functioning, and problem solving.  Pt has been diagnosed with Mild Neurocognitive Disorder and has baseline cognitive deficits.  Pt completed portions of the [place] Mental Status Examination, but was unable to complete the clock drawing secondary to tibial fracture of her dominant hand. Pt's daughter was present for this evaluation and stated that cognitive deficits had been more pronounced since admission.  Pt resides with her daughter and son-in-law and her daughter assists her with IADLs.    Recommend continued ST (inpatient rehab) at time of discharge in addition to assistance with IADLs.   [facility] SLUMS Examination Orientation  3/3  Numeric Problem Solving  1/3  Memory  2/5  Attention 2/2  Thought Organization 1/3  Clock Drawing Unable to complete  Visuospatial Skills    2/2  Short Story Recall  8/8  Total  19/26        SLP Assessment  SLP Recommendation/Assessment: Patient needs continued Speech [place] [facility] SLP Visit Diagnosis: Cognitive communication deficit (R41.841)    Recommendations for follow up therapy are one component of a multi-disciplinary discharge planning process,  led by the attending physician.  Recommendations may be updated based on patient status, additional functional criteria and insurance authorization.    Follow Up Recommendations  Acute inpatient rehab (3hours/day)    Assistance Recommended at Discharge  Intermittent Supervision/Assistance  Functional Status Assessment Patient has had a recent decline in their functional status and demonstrates the ability to make significant improvements in function in a reasonable and predictable amount of time.  Frequency and Duration min 2x/week  2 weeks      SLP Evaluation Cognition  Overall Cognitive Status: Impaired/Different from baseline Arousal/Alertness: Awake/alert Orientation Level: Oriented X4 Attention: Sustained;Focused Focused Attention: Appears intact Sustained Attention: Impaired Sustained Attention Impairment: Verbal complex Memory: Impaired Memory Impairment: Decreased short term memory Decreased Short Term Memory: Verbal basic Awareness: Impaired Awareness Impairment: Emergent impairment Problem Solving: Impaired Problem Solving Impairment: Verbal complex Executive Function: Organizing Organizing: Impaired Organizing Impairment: Verbal complex       Comprehension  Auditory Comprehension Overall Auditory Comprehension: Appears within functional limits for tasks assessed    Expression Expression Primary Mode of Expression: Verbal Verbal Expression Overall Verbal Expression: Appears within functional limits for tasks assessed Written Expression Dominant Hand: Right   Oral / Motor  Oral Motor/Sensory Function Overall Oral Motor/Sensory Function: Within functional limits Motor Speech Overall Motor Speech: Appears within functional limits for tasks assessed           Bretta Bang, M.S., [place] Office: [phone]  [place] 05/17/2022, 1:39 PM

## 2022-05-17 NOTE — Evaluation (Signed)
Clinical/Bedside Swallow Evaluation Patient Details  Name: Miranda Roman MRN: [id number] Date of Birth: May 01, 1946  Today's Date: 05/17/2022 Time: SLP Start Time (ACUTE ONLY): 1237 SLP Stop Time (ACUTE ONLY): 1255 SLP Time Calculation (min) (ACUTE ONLY): 18 min  Past Medical History:  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   Borderline steroid-induced glaucoma, 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   OD, Early elevated intraocular pressure to 28 mm we will discontinue all topical medications from surgery in the right eye today   Bronchiectasis without complication 68/06/7516   Cataract 07/03/2020   Chest pain at rest 01/30/2015   Chronic kidney disease, stage 4 (severe) 12/07/2020   Closed fracture of right tibial plateau 07/17/2021   Cyst of left kidney 03/10/2019   Needs repeated US 08/2019   Dysphagia, neurologic    Elevated d-dimer 12/20/2019   Facet arthritis of lumbar region 02/27/2019   Gastroesophageal reflux disease 08/26/2016   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   Hyperglycemia due to type 2 diabetes mellitus 12/07/2020   Hyperlipidemia    Hypertensive nephropathy 10/15/2017   Inadequate oral nutritional intake    Leukocytosis 12/22/2019   Long term (current) use of insulin 12/07/2020   Low grade squamous intraepithelial lesion (LGSIL) on cervicovaginal cytologic smear 12/15/2018   Lumbago 02/27/2019   Macular atrophy, 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  Further history patient suffered possible Wernicke's encephalopathy the past, a type of the vitamin deficiencies, thiamine that can also affect the retina if there is significant damage which may not be recoverable.   Mild neurocognitive disorder due to multiple etiologies 01/23/2022   Osteoporosis 12/15/2018   Overweight with body mass index (BMI) 25.0-29.9 12/15/2018   PAD (peripheral artery disease) 06/06/2019   Pain in right hand 04/27/2020   Pancreatic cyst 06/06/2019   Polyneuropathy due to type 2 diabetes mellitus    Posterior vitreous detachment of left eye 08/05/2021   Proliferative diabetic retinopathy of right eye 08/22/2021   Likely contributory cause of nonclearing vitreous hemorrhage in the right eye now status post PRP likely to resolve in 2 quiescent PDR, post vitrectomy PRP   Pulmonary hypertension 02/04/2021   Right knee pain 07/10/2021   Sickle-cell trait 12/07/2020   Type II diabetes mellitus 03/10/2012   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   Vitreous hemorrhage of right eye 08/05/2021   Vitrectomy PRP 08-14-2021   Past Surgical History:  Past Surgical History:  Procedure Laterality Date   BONE RESECTION  01/2006   wrist   GASTRIC BYPASS  08/14/2009   OPEN REDUCTION INTERNAL FIXATION (ORIF) TIBIA/FIBULA FRACTURE Right 05/10/2022   Procedure: OPEN REDUCTION INTERNAL FIXATION (ORIF) TIBIA/FIBULA FRACTURE;  Surgeon: Marchia Bond, MD;  Location: [facility];  Service: Orthopedics;  Laterality: Right;   OTHER SURGICAL HISTORY     sickle cell retinopathy laser surgery   TUBAL LIGATION  04/28/1977   HPI:  76  yo female admitted with R proximal tibia/fibula fx, R distal radius/ulna fx. S/P IM nailing R tibia 11/4. Non-operative management of R wrist fx-splinted. Hx of mild neurocognitive d/o, osteoporosis, pulm HTN, DM, CKD.  CXR remarkable for " Low lung  volumes with patchy bibasilar opacities which may represent atelectasis, aspiration, or pneumonia."    Assessment / Plan / Recommendation  Clinical Impression  Pt was seen for a bedside swallow evaluation and she presents with suspected functional oropharyngeal swallowing abilities.  Pt was encountered awake/alert with daughter at bedside.  Oral mechanism exam was unremarkable.  Pt consumed trials of thin liquid, puree, and regular solids.  Pt fed herself given set up assistance.  She demonstrated good bolus acceptance, timely mastication, suspected timely AP transport/swallow initiation, and consistent hyolaryngeal elevation/excursion to observation and palpation.  No overt s/sx of aspiration were observed with any trials.  Recommend continuation of regular solids and thin liquids with medications administered whole with liquid.  SLP will briefly f/u to monitor diet tolerance given CXR.    SLP Visit Diagnosis: Dysphagia, unspecified (R13.10)    Aspiration Risk  Mild aspiration risk    Diet Recommendation Regular;Thin liquid   Liquid Administration via: Cup;Straw Medication Administration: Whole meds with liquid Supervision: Staff to assist with self feeding Compensations: Minimize environmental distractions;Slow rate;Small sips/bites    Other  Recommendations Oral Care Recommendations: Oral care BID    Recommendations for follow up therapy are one component of a multi-disciplinary discharge planning process, led by the attending physician.  Recommendations may be updated based on patient status, additional functional criteria and insurance authorization.  Follow up Recommendations No SLP follow up      Assistance Recommended at Discharge Intermittent Supervision/Assistance  Functional Status Assessment Patient has had a recent decline in their functional status and demonstrates the ability to make significant improvements in function in a reasonable and predictable amount of time.   Frequency and Duration min 2x/week  2 weeks       Prognosis Prognosis for Safe Diet Advancement: Good Barriers to Reach Goals: Cognitive deficits      Swallow Study   General HPI: 76 yo female admitted with R proximal tibia/fibula fx, R distal radius/ulna fx. S/P IM nailing R tibia 11/4. Non-operative management of R wrist fx-splinted. Hx of mild neurocognitive d/o, osteoporosis, pulm HTN, DM, CKD.  CXR remarkable for " Low lung volumes with patchy bibasilar opacities which may represent atelectasis, aspiration, or pneumonia." Type of Study: Bedside Swallow Evaluation Previous Swallow Assessment: BSE 02/02/20 with recommendations for Dysphagia 1/thin liquids with upgrade to regular solids throughout course of hospitalization Diet Prior to this Study: Regular Temperature Spikes Noted: Yes Respiratory Status: Room air History of Recent Intubation: No Behavior/Cognition: Alert;Cooperative;Pleasant mood Oral Cavity Assessment: Within Functional Limits Oral Care Completed by SLP: No Oral Cavity - Dentition: Adequate natural dentition Vision: Functional for self-feeding Self-Feeding Abilities: Needs assist Patient Positioning: Upright in bed Baseline Vocal Quality: Normal Volitional Swallow: Able to elicit    Oral/Motor/Sensory Function Overall Oral Motor/Sensory Function: Within functional limits   Ice Chips     Thin Liquid Thin Liquid: Within functional limits Presentation: Straw    Nectar Thick Nectar Thick Liquid: Not tested   Honey Thick Honey Thick Liquid: Not tested   Puree Puree: Not tested   Solid     Solid: Within functional limits Presentation: Spoon     Bretta Bang, M.S., [place] Office: [phone]  Elvia Collum Roshni Burbano 05/17/2022,1:27 PM

## 2022-05-17 NOTE — Progress Notes (Signed)
PROGRESS NOTE    Miranda Roman  CXK:[id number] DOB: Nov 18, 1945 DOA: 05/08/2022 PCP: Glendale Chard, MD    Chief Complaint  Patient presents with   Fall    Brief Narrative:  76 y.o. female with medical history significant of mild neurocognitive disorder, HTN, pulmonary HTN, bronchiectasis, T2DM, CKD4 who presents after a fall.    Pt just moved in to live with daughter and was tripped by 2 dogs today and fell. Did not hit her head or lose consciousness. She normally ambulates with walker but was not doing so at the time. Pt found to have R tib/fib fracture of distal radial metaphysis with mild volar apex angulation and acute nondisplaced fx of distal ulnar metaphysis   Assessment & Plan:   Principal Problem:   Tibia/fibula fracture, right, closed, initial encounter Active Problems:   PNA (pneumonia)   Type II diabetes mellitus   Benign essential hypertension   Leukocytosis   Right radial fracture   Right distal ulnar fracture   Left wrist pain   Acute renal failure superimposed on stage 4 chronic kidney disease (HCC)   Hypertension   Postoperative anemia due to acute blood loss  #1 right tib-fib fracture -Secondary to mechanical fall.  No syncopal episodes. -Plain films of her right tib-fib showed acute mildly to moderately commuted and displaced fracture of the proximal tibial metadiaphysis.  Acutely mildly displaced and mildly impacted fracture of the proximal fibular metaphysis. -Patient seen in consultation by orthopedics and patient underwent right proximal tibia fracture open plating with IM nail fixation, closed management right distal radius and ulna fracture per orthopedics, Dr. Mardelle Matte 05/10/2022. -Pain management and DVT prophylaxis per orthopedics. -Patient seen by PT/OT recommending CIR. -CIR consulted and initial denial by insurance.   -Appeal process underway, per daughter denial reversed. -Patient noted prior to hospitalization to have been ambulating with a  walker per daughter and caretaker. -Outpatient follow-up with orthopedics. -PT OT.  2.  Left wrist pain -Plain films of the left wrist negative for any fracture.  3.  CAP  -The morning of 05/16/2022, was notified by RN that patient with a temp of 103.1, noted to be tachycardic, increased respiratory rate and desats.   -Patient noted to have a significant leukocytosis with white count of 23.2 which is trending back down currently at 20. - Blood cultures also ordered and pending. -Patient noted overnight(05/16/2022) to have new abdominal pain, assessed by NP on-call CT abdomen and pelvis were done which are concerning for patchy infiltrates in the bases.  -Urine strep pneumococcus antigen negative.  -Urine Legionella antigen pending. -Patient currently afebrile, improving clinically. -Continue empiric IV Rocephin and IV azithromycin.  -Urinalysis done on 05/15/2022 with trace leukocytes, nitrite negative, 6-10 WBCs.  Urine cultures negative. -SLP evaluated. -Supportive care.    4.  Right radial fracture -Right wrist with acute fracture of the distal radial metaphysis with mild volar apex angulation.  Acute nondisplaced fracture of the distal ulnar metaphysis. -Hand surgery, Dr. Greta Doom consulted and fracture is nonoperable and patient placed in a splint. -Outpatient follow-up with orthopedics/hand surgery.  5.  Hypertension -Continue Norvasc, Lopressor.    6.  Diabetes mellitus type 2 -Hemoglobin A1c 5.5 (01/29/2022) -CBG 159 this morning. -Continue Levemir 10 units daily.   -Continue SSI.   7.  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 -Baseline creatinine approximately 2.3-2.6. -Creatinine trending back up currently at 3.57 today from 3.09 yesterday.  -CT abdomen and  pelvis done negative for hydronephrosis.  -Patient noted to be hypotensive yesterday with systolics in the high 20Q to 90s which have improved on fluids  and transfusion of PRBCs. -Patient with urine output of 1.550 L over the past 24 hours.  -Check a UA, urine sodium, urine creatinine. -Continue gentle hydration. -Continue home regimen calcitriol. -Discontinue IV morphine and placed on IV Dilaudid as needed. -Consult with nephrology for further evaluation and management.  8.  Postop acute blood loss anemia -Patient noted to have hemoglobin drop as low as 7.1 on 05/16/2022. -Patient noted to be tachycardic, hypotensive transiently however denied any overt bleeding. -Status posttransfusion 2 units packed red blood cells hemoglobin currently stable at 9.9 this morning. -Clinical improvement. -Follow H&H.    DVT prophylaxis: Lovenox Code Status: Full Family Communication: Updated patient and daughter at bedside.   Disposition:.  CIR (appeal process underway) versus SNF.  Status is: Inpatient Remains inpatient appropriate because: Unsafe disposition   Consultants:  Orthopedics: Dr.Marchwiany: 05/09/2022  Procedures:  Plain films of the right tib-fib 05/10/2022, 05/08/2022 Plain films of the pelvis 05/09/2022 Plain films of the right femur 05/09/2022 Plain films of bilateral wrists 05/08/2022 Right proximal tibia fracture open plate and with IM nail fixation, closed management right distal radius and ulna fracture per Dr. Mardelle Matte orthopedics 05/10/2021 CT abdomen and pelvis 05/16/2022 Transfusion 2 units packed red blood cells 05/16/2022  Antimicrobials:  Anti-infectives (From admission, onward)    Start     Dose/Rate Route Frequency Ordered Stop   05/16/22 1000  cefTRIAXone (ROCEPHIN) 2 g in sodium chloride 0.9 % 100 mL IVPB        2 g 200 mL/hr over 30 Minutes Intravenous Every 24 hours 05/16/22 0821 05/21/22 0959   05/16/22 1000  azithromycin (ZITHROMAX) 500 mg in sodium chloride 0.9 % 250 mL IVPB        500 mg 250 mL/hr over 60 Minutes Intravenous Every 24 hours 05/16/22 0821 05/21/22 0959   05/10/22 2200  ceFAZolin (ANCEF) IVPB  2g/100 mL premix        2 g 200 mL/hr over 30 Minutes Intravenous Every 6 hours 05/10/22 1323 05/11/22 1317   05/10/22 1000  vancomycin (VANCOCIN) IVPB 1000 mg/200 mL premix        1,000 mg 200 mL/hr over 60 Minutes Intravenous  Once 05/10/22 0909 05/10/22 1055   05/10/22 0913  vancomycin (VANCOCIN) 1-5 GM/200ML-% IVPB       Note to Pharmacy: Dara Lords M: cabinet override      05/10/22 0913 05/10/22 1005         Subjective: Laying in bed.  Daughter at bedside.  States overall she feels better.  No chest pain.  Feels breathing has improved.  States had bowel movement 2 days ago.  Tolerated breakfast well this morning.    Objective: Vitals:   05/16/22 2330 05/17/22 0021 05/17/22 0516 05/17/22 1325  BP:  (!) 104/53 122/62 122/60  Pulse:  90 98 94  Resp:  _0 Temp: (!) 100.4 F (38 C) 98.9 F (37.2 C) (!) 100.5 F (38.1 C) 98.8 F (37.1 C)  TempSrc: Oral Oral Oral Oral  SpO2:  95% 95% 95%  Weight:      Height:        Intake/Output Summary (Last 24 hours) at 05/17/2022 1714 Last data filed at 05/17/2022 1645 Gross per 24 hour  Intake 2730.78 ml  Output 1200 ml  Net 1530.78 ml   Filed Weights   05/08/22 2300  Weight: 78.2 kg    Examination:  General exam: NAD Respiratory system: Decreasing coarse bibasilar breath sounds.  No wheezing.  Fair air movement.  Speaking in full sentences.   Cardiovascular system: Regular rate and rhythm no murmurs rubs or gallops.  No JVD.  No lower extremity edema.  Gastrointestinal system: Abdomen soft, nontender, nondistended, loss of bowel sounds.  No rebound.  No guarding.  Central nervous system: Alert and oriented. No focal neurological deficits.  Moving extremities spontaneously. Extremities: Right lower extremity in postop dressing.  Right upper extremity in splint.  Skin: No rashes, lesions or ulcers Psychiatry: Judgement and insight appear fair.. Mood & affect appropriate.     Data Reviewed: I have personally reviewed  following labs and imaging studies  CBC: Recent Labs  Lab 05/13/22 0342 05/14/22 0719 05/15/22 1035 05/16/22 0920 05/16/22 1551 05/17/22 0522  WBC 8.5 9.5 14.7* 23.2*  --  20.0*  NEUTROABS  --   --   --  17.9*  --  15.5*  HGB 8.1* 8.1* 9.0* 7.1* 8.0* 9.9*  HCT 25.2* 24.5* 27.9* 21.9* 25.0* 29.8*  MCV 73.7* 72.1* 74.2* 74.5*  --  78.6*  PLT 133* 147* 177 166  --  782    Basic Metabolic Panel: Recent Labs  Lab 05/13/22 0342 05/14/22 0343 05/15/22 1035 05/16/22 0304 05/17/22 0522  NA 140 141 141 141 144  K 4.6 4.4 4.4 5.1 4.4  CL 111 111 112* 111 116*  CO2 24 22 21* 19* 21*  GLUCOSE 130* 165* 239* 206* 149*  BUN 48* 48* 53* 56* 52*  CREATININE 2.64* 2.87* 2.86* 3.09* 3.57*  CALCIUM 8.1* 8.2* 8.4* 8.2* 8.0*  MG  --   --  2.1  --   --   PHOS  --   --   --  4.4 5.4*    GFR: Estimated Creatinine Clearance: 14 mL/min (A) (by C-G formula based on SCr of 3.57 mg/dL (H)).  Liver Function Tests: Recent Labs  Lab 05/11/22 0310 05/12/22 0327 05/13/22 0342 05/14/22 0343 05/16/22 0304 05/17/22 0522  AST _0 --   --   ALT 21 7 <5 6  --   --   ALKPHOS 53 55 55 53  --   --   BILITOT 0.5 0.7 0.6 0.5  --   --   PROT 5.7* 5.6* 5.7* 5.6*  --   --   ALBUMIN 2.7* 2.7* 2.7* 2.7* 2.7* 2.2*    CBG: Recent Labs  Lab 05/16/22 1156 05/16/22 1741 05/16/22 2143 05/17/22 0759 05/17/22 1145  GLUCAP 204* 232* 255* 159* 220*     Recent Results (from the past 240 hour(s))  Surgical pcr screen     Status: None   Collection Time: 05/10/22  8:53 AM   Specimen: Nasal Mucosa; Nasal Swab  Result Value Ref Range Status   MRSA, PCR NEGATIVE NEGATIVE Final   Staphylococcus aureus NEGATIVE NEGATIVE Final    Comment: (NOTE) The Xpert SA Assay (FDA approved for NASAL specimens in patients 39 years of age and older), is one component of a comprehensive surveillance program. It is not intended to diagnose infection nor to guide or monitor treatment. Performed at [facility], [place] [address], [place], [place] [address]   Urine Culture     Status: None   Collection Time: 05/15/22 11:12 AM   Specimen: Urine, Catheterized  Result Value Ref Range Status   Specimen Description   Final    URINE, CATHETERIZED Performed  at [facility], [place] [address] [place], [place] [address]    Special Requests   Final    NONE Performed at [facility], [place] [address], [place], [place] [address]    Culture   Final    NO GROWTH Performed at [facility], [place] [address], [place], [place] [address]    Report Status 05/16/2022 FINAL  Final  Culture, blood (Routine X 2) w Reflex to ID Panel     Status: None (Preliminary result)   Collection Time: 05/16/22  9:20 AM   Specimen: BLOOD LEFT HAND  Result Value Ref Range Status   Specimen Description   Final    BLOOD LEFT HAND Performed at [place] [address], [place], [place] [address]    Special Requests   Final    BOTTLES DRAWN AEROBIC AND ANAEROBIC Blood Culture adequate volume Performed at [place] [address] [place], [place] [address]    Culture   Final    NO GROWTH < 24 HOURS Performed at [place] [address], [place], [place] [address]    Report Status PENDING  Incomplete  Culture, blood (Routine X 2) w Reflex to ID Panel     Status: None (Preliminary result)   Collection Time: 05/16/22  9:33 AM   Specimen: BLOOD RIGHT ARM  Result Value Ref Range Status   Specimen Description   Final    BLOOD RIGHT ARM Performed at [place] [address], [place], [place] [address]    Special Requests   Final    BOTTLES DRAWN AEROBIC AND ANAEROBIC Blood Culture adequate volume Performed at [facility] [address], [place], [place] [address]    Culture   Final    NO GROWTH < 24 HOURS Performed at [place] [address],  [place], [place] [address]    Report Status PENDING  Incomplete         Radiology Studies: CT ABDOMEN PELVIS WO CONTRAST  Result Date: 05/16/2022 CLINICAL DATA:  Abdominal pain and spasms, elevated white blood count. EXAM: CT ABDOMEN AND PELVIS WITHOUT CONTRAST TECHNIQUE: Multidetector CT imaging of the abdomen and pelvis was performed following the standard protocol without IV contrast. RADIATION DOSE REDUCTION: This exam was performed according to the departmental dose-optimization program which includes automated exposure control, adjustment of the mA and/or kV according to patient size and/or use of iterative reconstruction technique. COMPARISON:  04/09/2020, ninth 09/24/2018. FINDINGS: Lower chest: The heart is normal in size and there is no pericardial effusion. Scattered coronary artery calcifications are noted patchy infiltrates are noted at the lung bases bilaterally. Hepatobiliary: No focal liver abnormality is seen. No biliary ductal dilatation. Stones are present within the gallbladder. Pancreas: A 9 mm hypodensity is noted in the tail of the pancreas, possible cyst. No pancreatic ductal dilatation or surrounding inflammatory changes. Spleen: Normal in size without focal abnormality. Adrenals/Urinary Tract: The adrenal glands are within normal limits. There is a cyst in the mid right kidney no renal calculus or hydronephrosis. The bladder is unremarkable. 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 Appendix appears normal. No evidence of bowel wall thickening, distention, or inflammatory changes. No free air or pneumatosis. Scattered diverticula are present along the colon without evidence of diverticulitis. Vascular/Lymphatic: Aortic atherosclerosis. No enlarged abdominal or pelvic lymph nodes. Reproductive: Uterus and bilateral adnexa are unremarkable. Other: No  abdominopelvic ascites. Scattered foci of air are noted in the anterior abdominal wall  which may be iatrogenic. 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 IMPRESSION: 1. Patchy infiltrates at the lung bases, concerning for pneumonia. 2. Cholelithiasis. 3. 9 mm cyst in the tail of the pancreas and essentially unchanged from 2020. 4. Diverticulosis without diverticulitis. 5. Aortic atherosclerosis and coronary artery calcifications. Electronically Signed   By: Brett Fairy M.D.   On: 05/16/2022 02:26        Scheduled Meds:  atorvastatin  20 mg Oral Daily   calcitRIOL  0.25 mcg Oral Daily   cyanocobalamin  1,000 mcg Oral Daily   docusate sodium  100 mg Oral BID   enoxaparin (LOVENOX) injection  30 mg Subcutaneous Q24H   gabapentin  100 mg Oral QHS   insulin aspart  0-15 Units Subcutaneous TID WC   insulin aspart  0-5 Units Subcutaneous QHS   insulin detemir  10 Units Subcutaneous Daily   loratadine  10 mg Oral QHS   metoprolol tartrate  25 mg Oral BID   senna  1 tablet Oral BID   sodium bicarbonate  650 mg Oral BID   sodium phosphate  1 enema Rectal Once   Continuous Infusions:  sodium chloride Stopped (05/10/22 1825)   sodium chloride Stopped (05/17/22 1623)   azithromycin Stopped (05/17/22 1300)   cefTRIAXone (ROCEPHIN)  IV Stopped (05/17/22 0914)   methocarbamol (ROBAXIN) IV       LOS: 9 days    Time spent: 40 minutes    Irine Seal, MD [facility]   To contact the attending provider between 7A-7P or the covering provider during after hours 7P-7A, please log into the web site [email or web address] and access using universal [place] password for that web site. If you do not have the password, please call the hospital operator.  05/17/2022, 5:14 PM

## 2022-05-18 DIAGNOSIS — S52501K Unspecified fracture of the lower end of right radius, subsequent encounter for closed fracture with nonunion: Secondary | ICD-10-CM | POA: Diagnosis not present

## 2022-05-18 DIAGNOSIS — N179 Acute kidney failure, unspecified: Secondary | ICD-10-CM | POA: Diagnosis not present

## 2022-05-18 DIAGNOSIS — S82201A Unspecified fracture of shaft of right tibia, initial encounter for closed fracture: Secondary | ICD-10-CM | POA: Diagnosis not present

## 2022-05-18 DIAGNOSIS — I10 Essential (primary) hypertension: Secondary | ICD-10-CM | POA: Diagnosis not present

## 2022-05-18 LAB — RENAL FUNCTION PANEL
Albumin: 2.1 g/dL — ABNORMAL LOW (ref 3.5–5.0)
Anion gap: 9 (ref 5–15)
BUN: 56 mg/dL — ABNORMAL HIGH (ref 8–23)
CO2: 20 mmol/L — ABNORMAL LOW (ref 22–32)
Calcium: 8 mg/dL — ABNORMAL LOW (ref 8.9–10.3)
Chloride: 114 mmol/L — ABNORMAL HIGH (ref 98–111)
Creatinine, Ser: 3.18 mg/dL — ABNORMAL HIGH (ref 0.44–1.00)
GFR, Estimated: 15 mL/min — ABNORMAL LOW (ref >60)
Glucose, Bld: 120 mg/dL — ABNORMAL HIGH (ref 70–99)
Phosphorus: 3.8 mg/dL (ref 2.5–4.6)
Potassium: 4.2 mmol/L (ref 3.5–5.1)
Sodium: 143 mmol/L (ref 135–145)

## 2022-05-18 LAB — GLUCOSE, CAPILLARY
Glucose-Capillary: 127 mg/dL — ABNORMAL HIGH (ref 70–99)
Glucose-Capillary: 124 mg/dL — ABNORMAL HIGH (ref 70–99)
Glucose-Capillary: 110 mg/dL — ABNORMAL HIGH (ref 70–99)
Glucose-Capillary: 132 mg/dL — ABNORMAL HIGH (ref 70–99)

## 2022-05-18 LAB — CBC WITH DIFFERENTIAL/PLATELET
Abs Immature Granulocytes: 0.16 10*3/uL — ABNORMAL HIGH (ref 0.00–0.07)
Basophils Absolute: 0.1 10*3/uL (ref 0.0–0.1)
Basophils Relative: 0 %
Eosinophils Absolute: 0.3 10*3/uL (ref 0.0–0.5)
Eosinophils Relative: 1 %
HCT: 31.2 % — ABNORMAL LOW (ref 36.0–46.0)
Hemoglobin: 10 g/dL — ABNORMAL LOW (ref 12.0–15.0)
Immature Granulocytes: 1 %
Lymphocytes Relative: 8 %
Lymphs Abs: 1.5 10*3/uL (ref 0.7–4.0)
MCH: 25.8 pg — ABNORMAL LOW (ref 26.0–34.0)
MCHC: 32.1 g/dL (ref 30.0–36.0)
MCV: 80.6 fL (ref 80.0–100.0)
Monocytes Absolute: 1.6 10*3/uL — ABNORMAL HIGH (ref 0.1–1.0)
Monocytes Relative: 9 %
Neutro Abs: 15.1 10*3/uL — ABNORMAL HIGH (ref 1.7–7.7)
Neutrophils Relative %: 81 %
Platelets: 210 10*3/uL (ref 150–400)
RBC: 3.87 MIL/uL (ref 3.87–5.11)
RDW: 18.6 % — ABNORMAL HIGH (ref 11.5–15.5)
WBC: 18.7 10*3/uL — ABNORMAL HIGH (ref 4.0–10.5)
nRBC: 0 % (ref 0.0–0.2)

## 2022-05-18 LAB — MAGNESIUM: Magnesium: 2 mg/dL (ref 1.7–2.4)

## 2022-05-18 MED ORDER — SODIUM CHLORIDE 0.9 % IV SOLN
INTRAVENOUS | Status: DC
  Administered 2022-05-18: 1000 mL via INTRAVENOUS

## 2022-05-18 MED ORDER — AZITHROMYCIN 250 MG PO TABS
500.0000 mg | ORAL_TABLET | Freq: Every day | ORAL | Status: DC
  Administered 2022-05-19: 500 mg via ORAL
  Filled 2022-05-18: qty 2

## 2022-05-18 MED ORDER — ORAL CARE MOUTH RINSE: 15.0000 mL | OROMUCOSAL | Status: DC | PRN

## 2022-05-18 NOTE — Progress Notes (Signed)
PROGRESS NOTE    Miranda Roman  LDJ:[id number] DOB: 11/01/1945 DOA: 05/08/2022 PCP: Glendale Chard, MD    Chief Complaint  Patient presents with   Fall    Brief Narrative:  76 y.o. female with medical history significant of mild neurocognitive disorder, HTN, pulmonary HTN, bronchiectasis, T2DM, CKD4 who presents after a fall.    Pt just moved in to live with daughter and was tripped by 2 dogs today and fell. Did not hit her head or lose consciousness. She normally ambulates with walker but was not doing so at the time. Pt found to have R tib/fib fracture of distal radial metaphysis with mild volar apex angulation and acute nondisplaced fx of distal ulnar metaphysis   Assessment & Plan:   Principal Problem:   Tibia/fibula fracture, right, closed, initial encounter Active Problems:   PNA (pneumonia)   Type II diabetes mellitus   Benign essential hypertension   Leukocytosis   Right radial fracture   Right distal ulnar fracture   Left wrist pain   Acute renal failure superimposed on stage 4 chronic kidney disease (HCC)   Hypertension   Postoperative anemia due to acute blood loss  1 right tib-fib fracture -Secondary to mechanical fall.  No syncopal episodes. -Plain films of her right tib-fib showed acute mildly to moderately commuted and displaced fracture of the proximal tibial metadiaphysis.  Acutely mildly displaced and mildly impacted fracture of the proximal fibular metaphysis. -Patient seen in consultation by orthopedics and patient underwent right proximal tibia fracture open plating with IM nail fixation, closed management right distal radius and ulna fracture per orthopedics, Dr. Mardelle Matte 05/10/2022. -Pain management and DVT prophylaxis per orthopedics. -Patient seen by PT/OT recommending CIR. -CIR consulted and initial denial by insurance.   -Appeal process underway, per daughter denial reversed. -Patient noted prior to hospitalization to have been ambulating with a  walker per daughter and caretaker. -Outpatient follow-up with orthopedics. -PT OT.  2.  Left wrist pain -Plain films of the left wrist negative for any fracture.  3.  CAP  -The morning of 05/16/2022, was notified by RN that patient with a temp of 103.1, noted to be tachycardic, increased respiratory rate and desats.   -Patient noted to have a significant leukocytosis with white count of 23.2 which is trending back down currently at 18.7. - Blood cultures also ordered and pending. -Patient noted overnight(05/16/2022) to have new abdominal pain, assessed by NP on-call CT abdomen and pelvis were done which are concerning for patchy infiltrates in the bases.  -Urine strep pneumococcus antigen negative.  -Urine Legionella antigen pending. -Patient currently afebrile, improving clinically. -Continue empiric IV Rocephin and IV azithromycin.  -Urinalysis done on 05/15/2022 with trace leukocytes, nitrite negative, 6-10 WBCs.  Urine cultures negative. -SLP evaluated. -Supportive care.    4.  Right radial fracture -Right wrist with acute fracture of the distal radial metaphysis with mild volar apex angulation.  Acute nondisplaced fracture of the distal ulnar metaphysis. -Hand surgery, Dr. Greta Doom consulted and fracture is nonoperable and patient placed in a splint. -Outpatient follow-up with orthopedics/hand surgery.  5.  Hypertension -Norvasc discontinued per nephrology, Lopressor dose decreased.  -BP currently stable.    6.  Diabetes mellitus type 2 -Hemoglobin A1c 5.5 (01/29/2022) -CBG 127 this morning. -Continue Levemir 10 units daily.   -Continue SSI.   7.  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 -Baseline creatinine approximately 2.3-2.6. -Creatinine was trending back up to 3.57  from 3.09.  -CT abdomen and pelvis done negative for hydronephrosis.  -Patient noted to be hypotensive yesterday with systolics in the high 72Z  to 90s which have improved on fluids and transfusion of PRBCs. -Patient with urine output of 1.225 L over the past 24 hours.  -Urinalysis was nitrite negative leukocytes -0-5 WBCs. -Urine sodium of 72, urine creatinine of 41. -Continue home regimen calcitriol. -Discontinue IV morphine and placed on IV Dilaudid as needed. -Patient seen in consultation by nephrology who feel worsening renal function likely secondary to new infection with capillary leak and relative hypotension/hypoperfusion. -IV fluids increased to 75 cc an hour, Norvasc discontinued and metoprolol dose decreased per renal recommendations. -Renal function improved and trending back down currently at 3.18. -Urine output of 1.25 L over the past 24 hours. -Nephrology following and appreciate the input and recommendations.  8.  Postop acute blood loss anemia -Patient noted to have hemoglobin drop as low as 7.1 on 05/16/2022. -Patient noted to be tachycardic, hypotensive transiently however denied any overt bleeding. -Status posttransfusion 2 units packed red blood cells hemoglobin currently stable at 10.0 this morning. -Clinical improvement. -Follow H&H.    DVT prophylaxis: Lovenox Code Status: Full Family Communication: Updated patient and daughter at bedside.   Disposition:.  CIR (appeal process underway) versus SNF.  Status is: Inpatient Remains inpatient appropriate because: Unsafe disposition   Consultants:  Orthopedics: [email or web address]: 05/09/2022 Nephrology: Dr.Schertz 05/17/2022  Procedures:  Plain films of the right tib-fib 05/10/2022, 05/08/2022 Plain films of the pelvis 05/09/2022 Plain films of the right femur 05/09/2022 Plain films of bilateral wrists 05/08/2022 Right proximal tibia fracture open plate and with IM nail fixation, closed management right distal radius and ulna fracture per Dr. Mardelle Matte orthopedics 05/10/2021 CT abdomen and pelvis 05/16/2022 Transfusion 2 units packed red blood cells  05/16/2022  Antimicrobials:  Anti-infectives (From admission, onward)    Start     Dose/Rate Route Frequency Ordered Stop   05/16/22 1000  cefTRIAXone (ROCEPHIN) 2 g in sodium chloride 0.9 % 100 mL IVPB        2 g 200 mL/hr over 30 Minutes Intravenous Every 24 hours 05/16/22 0821 05/21/22 0959   05/16/22 1000  azithromycin (ZITHROMAX) 500 mg in sodium chloride 0.9 % 250 mL IVPB        500 mg 250 mL/hr over 60 Minutes Intravenous Every 24 hours 05/16/22 0821 05/21/22 0959   05/10/22 2200  ceFAZolin (ANCEF) IVPB 2g/100 mL premix        2 g 200 mL/hr over 30 Minutes Intravenous Every 6 hours 05/10/22 1323 05/11/22 1317   05/10/22 1000  vancomycin (VANCOCIN) IVPB 1000 mg/200 mL premix        1,000 mg 200 mL/hr over 60 Minutes Intravenous  Once 05/10/22 0909 05/10/22 1055   05/10/22 0913  vancomycin (VANCOCIN) 1-5 GM/200ML-% IVPB       Note to Pharmacy: Dara Lords M: cabinet override      05/10/22 0913 05/10/22 1005         Subjective: Laying in bed.  Overall states she is feeling better.  Denies any chest pain.  Denies any significant shortness of breath.  Feels cough is improving.  Denies any further abdominal pain.  Good urine output.  No bleeding.  Daughter at bedside.   Objective: Vitals:   05/18/22 0525 05/18/22 0539 05/18/22 0636 05/18/22 1002  BP: 132/87   138/69  Pulse:    94  Resp: (!) 23   (!) 24  Temp: (!) 101.1 F (  38.4 C) (!) 100.8 F (38.2 C) 100.3 F (37.9 C) 97.7 F (36.5 C)  TempSrc: Oral Oral Oral Oral  SpO2: 94% 94%  100%  Weight:      Height:        Intake/Output Summary (Last 24 hours) at 05/18/2022 1046 Last data filed at 05/18/2022 0500 Gross per 24 hour  Intake 1791.32 ml  Output 1225 ml  Net 566.32 ml    Filed Weights   05/08/22 2300 05/18/22 0500  Weight: 78.2 kg 78.3 kg    Examination:  General exam: NAD. Respiratory system: Decreased bibasilar coarse breath sounds.  No wheezing.  Fair air movement.  Speaking in full sentences.   Cardiovascular system: RRR no murmurs rubs or gallops.  No JVD.  No lower extremity edema.  Gastrointestinal system: Abdomen is soft, nontender, nondistended, positive bowel sounds.  No rebound.  No guarding.  Central nervous system: Alert and oriented. No focal neurological deficits.  Moving extremities spontaneously. Extremities: Right lower extremity in postop dressing.  Right upper extremity in splint.  Skin: No rashes, lesions or ulcers Psychiatry: Judgement and insight appear fair.. Mood & affect appropriate.     Data Reviewed: I have personally reviewed following labs and imaging studies  CBC: Recent Labs  Lab 05/14/22 0719 05/15/22 1035 05/16/22 0920 05/16/22 1551 05/17/22 0522 05/18/22 0435  WBC 9.5 14.7* 23.2*  --  20.0* 18.7*  NEUTROABS  --   --  17.9*  --  15.5* 15.1*  HGB 8.1* 9.0* 7.1* 8.0* 9.9* 10.0*  HCT 24.5* 27.9* 21.9* 25.0* 29.8* 31.2*  MCV 72.1* 74.2* 74.5*  --  78.6* 80.6  PLT 147* 177 166  --  164 210     Basic Metabolic Panel: Recent Labs  Lab 05/14/22 0343 05/15/22 1035 05/16/22 0304 05/17/22 0522 05/18/22 0435  NA 141 141 141 144 143  K 4.4 4.4 5.1 4.4 4.2  CL 111 112* 111 116* 114*  CO2 22 21* 19* 21* 20*  GLUCOSE 165* 239* 206* 149* 120*  BUN 48* 53* 56* 52* 56*  CREATININE 2.87* 2.86* 3.09* 3.57* 3.18*  CALCIUM 8.2* 8.4* 8.2* 8.0* 8.0*  MG  --  2.1  --   --   --   PHOS  --   --  4.4 5.4* 3.8     GFR: Estimated Creatinine Clearance: 15.7 mL/min (A) (by C-G formula based on SCr of 3.18 mg/dL (H)).  Liver Function Tests: Recent Labs  Lab 05/12/22 0327 05/13/22 0342 05/14/22 0343 05/16/22 0304 05/17/22 0522 05/18/22 0435  AST _0 --   --   --   ALT 7 <5 6  --   --   --   ALKPHOS 55 55 53  --   --   --   BILITOT 0.7 0.6 0.5  --   --   --   PROT 5.6* 5.7* 5.6*  --   --   --   ALBUMIN 2.7* 2.7* 2.7* 2.7* 2.2* 2.1*     CBG: Recent Labs  Lab 05/17/22 0759 05/17/22 1145 05/17/22 1715 05/17/22 2144 05/18/22 0758   GLUCAP 159* 220* 164* 158* 127*      Recent Results (from the past 240 hour(s))  Surgical pcr screen     Status: None   Collection Time: 05/10/22  8:53 AM   Specimen: Nasal Mucosa; Nasal Swab  Result Value Ref Range Status   MRSA, PCR NEGATIVE NEGATIVE Final   Staphylococcus aureus NEGATIVE NEGATIVE Final    Comment: (NOTE)  The Xpert SA Assay (FDA approved for NASAL specimens in patients 66 years of age and older), is one component of a comprehensive surveillance program. It is not intended to diagnose infection nor to guide or monitor treatment. Performed at [facility], [place] [address], [place], [place] [address]   Urine Culture     Status: None   Collection Time: 05/15/22 11:12 AM   Specimen: Urine, Catheterized  Result Value Ref Range Status   Specimen Description   Final    URINE, CATHETERIZED Performed at [facility] [address], [place], [place] [address]    Special Requests   Final    NONE Performed at [facility], [place] [address], [place], [place] [address]    Culture   Final    NO GROWTH Performed at [facility], [place] [address] [place], [place] [address]    Report Status 05/16/2022 FINAL  Final  Culture, blood (Routine X 2) w Reflex to ID Panel     Status: None (Preliminary result)   Collection Time: 05/16/22  9:20 AM   Specimen: BLOOD LEFT HAND  Result Value Ref Range Status   Specimen Description   Final    BLOOD LEFT HAND Performed at [facility] [address], [place], [place] [address]    Special Requests   Final    BOTTLES DRAWN AEROBIC AND ANAEROBIC Blood Culture adequate volume Performed at [facility] [address] [place], [place] [address]    Culture   Final    NO GROWTH 2 DAYS Performed at [facility] [address] [place], [place] [address]    Report Status PENDING  Incomplete  Culture, blood (Routine X 2) w Reflex  to ID Panel     Status: None (Preliminary result)   Collection Time: 05/16/22  9:33 AM   Specimen: BLOOD RIGHT ARM  Result Value Ref Range Status   Specimen Description   Final    BLOOD RIGHT ARM Performed at [facility] [address] [place], [place] [address]    Special Requests   Final    BOTTLES DRAWN AEROBIC AND ANAEROBIC Blood Culture adequate volume Performed at [facility] [address], [place], [place] [address]    Culture   Final    NO GROWTH 2 DAYS Performed at [facility] [address] [place], [place] [address]    Report Status PENDING  Incomplete         Radiology Studies: No results found.      Scheduled Meds:  atorvastatin  20 mg Oral Daily   calcitRIOL  0.25 mcg Oral Daily   cyanocobalamin  1,000 mcg Oral Daily   docusate sodium  100 mg Oral BID   enoxaparin (LOVENOX) injection  30 mg Subcutaneous Q24H   gabapentin  100 mg Oral QHS   insulin aspart  0-15 Units Subcutaneous TID WC   insulin aspart  0-5 Units Subcutaneous QHS   insulin detemir  10 Units Subcutaneous Daily   loratadine  10 mg Oral QHS   metoprolol tartrate  25 mg Oral BID   senna  1 tablet Oral BID   sodium bicarbonate  650 mg Oral BID   sodium phosphate  1 enema Rectal Once   Continuous Infusions:  sodium chloride Stopped (05/10/22 1825)   azithromycin 500 mg (05/18/22 1015)   cefTRIAXone (ROCEPHIN)  IV 2 g (05/18/22 0818)  methocarbamol (ROBAXIN) IV       LOS: 10 days    Time spent: 40 minutes    Irine Seal, MD [facility]   To contact the attending provider between 7A-7P or the covering provider during after hours 7P-7A, please log into the web site [email or web address] and access using universal [place] password for that web site. If you do not have the password, please call the hospital operator.  05/18/2022, 10:46 AM

## 2022-05-18 NOTE — Progress Notes (Signed)
05/18/22 0525  Assess: MEWS Score  Temp (!) 101.1 F (38.4 C)  BP 132/87  MAP (mmHg) 100  ECG Heart Rate (!) 110  Resp (!) 23  Level of Consciousness Alert  SpO2 94 %  O2 Device Nasal Cannula  Assess: MEWS Score  MEWS Temp 1  MEWS Systolic 0  MEWS Pulse 1  MEWS RR 1  MEWS LOC 0  MEWS Score 3  MEWS Score Color Yellow  Assess: if the MEWS score is Yellow or Red  Were vital signs taken at a resting state? Yes  Focused Assessment Change from prior assessment (see assessment flowsheet)  Does the patient meet 2 or more of the SIRS criteria? Yes  Does the patient have a confirmed or suspected source of infection? Yes  Provider and Rapid Response Notified? Yes  MEWS guidelines implemented *See Row Information* Yes  Treat  MEWS Interventions Administered prn meds/treatments  Pain Scale 0-10  Pain Score 0  Take Vital Signs  Increase Vital Sign Frequency  Yellow: Q 2hr X 2 then Q 4hr X 2, if remains yellow, continue Q 4hrs  Escalate  MEWS: Escalate Yellow: discuss with charge nurse/RN and consider discussing with provider and RRT  Notify: Charge Nurse/RN  Name of Charge Nurse/RN Notified Tomma Rakers., RN  Date Charge Nurse/RN Notified 05/18/22  Time Charge Nurse/RN Notified [id number]  Notify: Provider  Provider Name/Title Gershon Cull, NP  Date Provider Notified 05/18/22  Time Provider Notified [phone]  Method of Notification Page  Notification Reason Other (Comment) (yewllow MEWS)  Assess: SIRS CRITERIA  SIRS Temperature  1  SIRS Pulse 1  SIRS Respirations  1  SIRS WBC 1  SIRS Score Sum  4   Recheck pt's temp @ 0539 is 100.8 F. No complained of any pain and pt states feel fine. PRN Tylenol  given and applied ice packs. Will continue to monitor pt.

## 2022-05-18 NOTE — Plan of Care (Signed)
  Problem: Education: Goal: Ability to describe self-care measures that may prevent or decrease complications (Diabetes Survival Skills Education) will improve Outcome: Progressing Goal: Individualized Educational Video(s) Outcome: Progressing

## 2022-05-18 NOTE — Progress Notes (Signed)
[facility] Progress Note  Subjective: pt seen in room, good UOP 725 yest and 1100 today so far. Creat down 3.5 > 3.1 today. Pt in good spirits.   Vitals:   05/18/22 0525 05/18/22 0539 05/18/22 0636 05/18/22 1002  BP: 132/87   138/69  Pulse:    94  Resp: (!) 23   (!) 24  Temp: (!) 101.1 F (38.4 C) (!) 100.8 F (38.2 C) 100.3 F (37.9 C) 97.7 F (36.5 C)  TempSrc: Oral Oral Oral Oral  SpO2: 94% 94%  100%  Weight:      Height:        Exam: Gen alert, elderly AAF no distress No jvd or bruits Chest clear bilat to bases RRR no MRG Abd soft ntnd no mass or ascites +bs Ext no LE or UE edema Neuro is alert, Ox 3 , nf      Home meds include - amlodipine 2.5 qd, atorvastatin, gabapentin, humalog/ levemir insulin, metoprolol 50 bid, MVI, sod bicarb 1 bid, prns/ vits/ supps     Date                           Creat               eGFR   2019- 2020                2.44- 2.84   March 2021               2.36   Jul- aug 2021            1.44-  2.05 May 2020                   1.91- 2.30   Jun- nov 2022           2.30                    Feb 2023                   2.59                 19 ml/min     July 2023                  2.43                 20 ml/min   Sept 2023                  2.60    11/02                                    2.60                 19 ml/min    11/04                                    3.07     11/06                       2.36     11/08                       2.87  11/10                       3.09                 15 ml/min     11/11                       3.57                 13 ml/min      UA 11/9 - 100 prot, 0-5 rbc, 6-10 wbc, rare bact    CT abd w/o contrast 11/10 - Adrenals/Urinary Tract: The adrenal glands are within normal limits. There is a cyst in the mid right kidney no renal calculus or hydronephrosis. The bladder is unremarkable   Assessment/ Plan: AKI on CKD 4 - B/l creatinine 2.43- 2.60, from feb- sept  2023, eGFR 19-  20 ml/min. Pt is f/b Dr Royce Macadamia at [facility]. Creat here was 2.6 on admission in setting of fall and tib/ fib fracture. Creat was stable for several days until last 48 hrs when pt developed high fevers w/ suspected infection/ PNA. BP dropped slightly lower. Creat increased up to 3.1 and then 3.5. CT showed no obstruction and UA was unremarkable. No nephrotoxic exposure. AKI suspected due to new infection w/ cap leak and relative hypotension/ hypoperfusion. Her IVF's at 75 cc/hr were continued and BP meds were lowered. BP's came up today in the 130/80 range, creat is down at 3.1 and UOP back up. Cont plan, will follow.  HTN - norvasc dc'd and metoprolol lowered to 25 bid, hold if SBP < 125. Let BPs run a bit high for now.  Fall w/ R tib/ fib fracture - s/p ORIF 11/4 CAP - w/ low sats, temp 103, getting IV rocephin/ zithromax. Fevers appear to be improving.  DM2 - per pmd.       Kelly Splinter 05/18/2022, 1:17 PM   Recent Labs  Lab 05/17/22 0522 05/18/22 0435  HGB 9.9* 10.0*  ALBUMIN 2.2* 2.1*  CALCIUM 8.0* 8.0*  PHOS 5.4* 3.8  CREATININE 3.57* 3.18*  K 4.4 4.2   No results for input(s): "IRON", "TIBC", "FERRITIN" in the last 168 hours. Inpatient medications:  atorvastatin  20 mg Oral Daily   [START ON 05/19/2022] azithromycin  500 mg Oral Daily   calcitRIOL  0.25 mcg Oral Daily   cyanocobalamin  1,000 mcg Oral Daily   docusate sodium  100 mg Oral BID   enoxaparin (LOVENOX) injection  30 mg Subcutaneous Q24H   gabapentin  100 mg Oral QHS   insulin aspart  0-15 Units Subcutaneous TID WC   insulin aspart  0-5 Units Subcutaneous QHS   insulin detemir  10 Units Subcutaneous Daily   loratadine  10 mg Oral QHS   metoprolol tartrate  25 mg Oral BID   senna  1 tablet Oral BID   sodium bicarbonate  650 mg Oral BID   sodium phosphate  1 enema Rectal Once    sodium chloride Stopped (05/10/22 1825)   cefTRIAXone (ROCEPHIN)  IV 2 g (05/18/22 0818)   methocarbamol (ROBAXIN) IV     sodium  chloride, acetaminophen, bisacodyl, diphenhydrAMINE, HYDROmorphone (DILAUDID) injection, magnesium citrate, methocarbamol **OR** methocarbamol (ROBAXIN) IV, metoCLOPramide **OR** metoCLOPramide (REGLAN) injection, ondansetron **OR** ondansetron (ZOFRAN) IV, mouth rinse, polyethylene glycol, traMADol

## 2022-05-19 ENCOUNTER — Encounter (HOSPITAL_COMMUNITY): Payer: Self-pay | Admitting: Physical Medicine and Rehabilitation

## 2022-05-19 ENCOUNTER — Inpatient Hospital Stay (HOSPITAL_COMMUNITY): Payer: Medicare Other

## 2022-05-19 ENCOUNTER — Inpatient Hospital Stay (HOSPITAL_COMMUNITY)
Admission: RE | Admit: 2022-05-19 | Discharge: 2022-05-19 | Disposition: A | Discharge status: Home / Self Care | Payer: Medicare Other | Source: Ambulatory Visit | Attending: Nephrology | Admitting: Nephrology

## 2022-05-19 ENCOUNTER — Other Ambulatory Visit: Payer: Self-pay

## 2022-05-19 ENCOUNTER — Inpatient Hospital Stay (HOSPITAL_COMMUNITY)
Admission: RE | Admit: 2022-05-19 | Discharge: 2022-06-06 | DRG: 559 | Disposition: A | Discharge status: Home Health | Payer: Medicare Other | Source: Intra-hospital | Attending: Physical Medicine and Rehabilitation | Admitting: Physical Medicine and Rehabilitation

## 2022-05-19 DIAGNOSIS — R32 Unspecified urinary incontinence: Secondary | ICD-10-CM | POA: Diagnosis not present

## 2022-05-19 DIAGNOSIS — S82201S Unspecified fracture of shaft of right tibia, sequela: Secondary | ICD-10-CM | POA: Diagnosis not present

## 2022-05-19 DIAGNOSIS — N179 Acute kidney failure, unspecified: Secondary | ICD-10-CM | POA: Diagnosis not present

## 2022-05-19 DIAGNOSIS — M25532 Pain in left wrist: Secondary | ICD-10-CM | POA: Diagnosis not present

## 2022-05-19 DIAGNOSIS — E1142 Type 2 diabetes mellitus with diabetic polyneuropathy: Secondary | ICD-10-CM | POA: Diagnosis not present

## 2022-05-19 DIAGNOSIS — S82201D Unspecified fracture of shaft of right tibia, subsequent encounter for closed fracture with routine healing: Secondary | ICD-10-CM | POA: Diagnosis not present

## 2022-05-19 DIAGNOSIS — I129 Hypertensive chronic kidney disease with stage 1 through stage 4 chronic kidney disease, or unspecified chronic kidney disease: Secondary | ICD-10-CM | POA: Diagnosis not present

## 2022-05-19 DIAGNOSIS — R079 Chest pain, unspecified: Secondary | ICD-10-CM | POA: Diagnosis not present

## 2022-05-19 DIAGNOSIS — M109 Gout, unspecified: Secondary | ICD-10-CM | POA: Diagnosis present

## 2022-05-19 DIAGNOSIS — S52691P Other fracture of lower end of right ulna, subsequent encounter for closed fracture with malunion: Secondary | ICD-10-CM | POA: Diagnosis not present

## 2022-05-19 DIAGNOSIS — R350 Frequency of micturition: Secondary | ICD-10-CM | POA: Diagnosis not present

## 2022-05-19 DIAGNOSIS — K5900 Constipation, unspecified: Secondary | ICD-10-CM | POA: Diagnosis present

## 2022-05-19 DIAGNOSIS — S82401A Unspecified fracture of shaft of right fibula, initial encounter for closed fracture: Secondary | ICD-10-CM | POA: Diagnosis not present

## 2022-05-19 DIAGNOSIS — E113591 Type 2 diabetes mellitus with proliferative diabetic retinopathy without macular edema, right eye: Secondary | ICD-10-CM | POA: Diagnosis present

## 2022-05-19 DIAGNOSIS — J189 Pneumonia, unspecified organism: Secondary | ICD-10-CM | POA: Diagnosis not present

## 2022-05-19 DIAGNOSIS — Z794 Long term (current) use of insulin: Secondary | ICD-10-CM

## 2022-05-19 DIAGNOSIS — D569 Thalassemia, unspecified: Secondary | ICD-10-CM | POA: Diagnosis present

## 2022-05-19 DIAGNOSIS — E785 Hyperlipidemia, unspecified: Secondary | ICD-10-CM | POA: Diagnosis present

## 2022-05-19 DIAGNOSIS — Z87891 Personal history of nicotine dependence: Secondary | ICD-10-CM

## 2022-05-19 DIAGNOSIS — D631 Anemia in chronic kidney disease: Secondary | ICD-10-CM | POA: Diagnosis not present

## 2022-05-19 DIAGNOSIS — S82401D Unspecified fracture of shaft of right fibula, subsequent encounter for closed fracture with routine healing: Secondary | ICD-10-CM

## 2022-05-19 DIAGNOSIS — Y95 Nosocomial condition: Secondary | ICD-10-CM

## 2022-05-19 DIAGNOSIS — I2510 Atherosclerotic heart disease of native coronary artery without angina pectoris: Secondary | ICD-10-CM | POA: Diagnosis not present

## 2022-05-19 DIAGNOSIS — T380X5A Adverse effect of glucocorticoids and synthetic analogues, initial encounter: Secondary | ICD-10-CM | POA: Diagnosis present

## 2022-05-19 DIAGNOSIS — D649 Anemia, unspecified: Secondary | ICD-10-CM | POA: Diagnosis not present

## 2022-05-19 DIAGNOSIS — Z9884 Bariatric surgery status: Secondary | ICD-10-CM | POA: Diagnosis not present

## 2022-05-19 DIAGNOSIS — D571 Sickle-cell disease without crisis: Secondary | ICD-10-CM | POA: Diagnosis present

## 2022-05-19 DIAGNOSIS — S82201A Unspecified fracture of shaft of right tibia, initial encounter for closed fracture: Secondary | ICD-10-CM | POA: Diagnosis present

## 2022-05-19 DIAGNOSIS — M7989 Other specified soft tissue disorders: Secondary | ICD-10-CM | POA: Diagnosis not present

## 2022-05-19 DIAGNOSIS — K5901 Slow transit constipation: Secondary | ICD-10-CM

## 2022-05-19 DIAGNOSIS — T07XXXA Unspecified multiple injuries, initial encounter: Secondary | ICD-10-CM | POA: Diagnosis not present

## 2022-05-19 DIAGNOSIS — D72829 Elevated white blood cell count, unspecified: Secondary | ICD-10-CM | POA: Diagnosis not present

## 2022-05-19 DIAGNOSIS — Z888 Allergy status to other drugs, medicaments and biological substances status: Secondary | ICD-10-CM

## 2022-05-19 DIAGNOSIS — S52501A Unspecified fracture of the lower end of right radius, initial encounter for closed fracture: Secondary | ICD-10-CM | POA: Diagnosis not present

## 2022-05-19 DIAGNOSIS — Z79899 Other long term (current) drug therapy: Secondary | ICD-10-CM

## 2022-05-19 DIAGNOSIS — S52501D Unspecified fracture of the lower end of right radius, subsequent encounter for closed fracture with routine healing: Secondary | ICD-10-CM | POA: Diagnosis not present

## 2022-05-19 DIAGNOSIS — Z8249 Family history of ischemic heart disease and other diseases of the circulatory system: Secondary | ICD-10-CM

## 2022-05-19 DIAGNOSIS — N390 Urinary tract infection, site not specified: Secondary | ICD-10-CM | POA: Diagnosis not present

## 2022-05-19 DIAGNOSIS — S52601D Unspecified fracture of lower end of right ulna, subsequent encounter for closed fracture with routine healing: Secondary | ICD-10-CM | POA: Diagnosis not present

## 2022-05-19 DIAGNOSIS — E1169 Type 2 diabetes mellitus with other specified complication: Secondary | ICD-10-CM

## 2022-05-19 DIAGNOSIS — E1122 Type 2 diabetes mellitus with diabetic chronic kidney disease: Secondary | ICD-10-CM | POA: Diagnosis present

## 2022-05-19 DIAGNOSIS — F320 Major depressive disorder, single episode, mild: Secondary | ICD-10-CM | POA: Diagnosis not present

## 2022-05-19 DIAGNOSIS — N189 Chronic kidney disease, unspecified: Secondary | ICD-10-CM | POA: Diagnosis not present

## 2022-05-19 DIAGNOSIS — E669 Obesity, unspecified: Secondary | ICD-10-CM

## 2022-05-19 DIAGNOSIS — A499 Bacterial infection, unspecified: Secondary | ICD-10-CM | POA: Diagnosis not present

## 2022-05-19 DIAGNOSIS — M79675 Pain in left toe(s): Secondary | ICD-10-CM | POA: Diagnosis not present

## 2022-05-19 DIAGNOSIS — N184 Chronic kidney disease, stage 4 (severe): Secondary | ICD-10-CM | POA: Diagnosis present

## 2022-05-19 DIAGNOSIS — R410 Disorientation, unspecified: Secondary | ICD-10-CM | POA: Diagnosis not present

## 2022-05-19 DIAGNOSIS — F09 Unspecified mental disorder due to known physiological condition: Secondary | ICD-10-CM | POA: Diagnosis not present

## 2022-05-19 DIAGNOSIS — I272 Pulmonary hypertension, unspecified: Secondary | ICD-10-CM | POA: Diagnosis not present

## 2022-05-19 DIAGNOSIS — H547 Unspecified visual loss: Secondary | ICD-10-CM | POA: Diagnosis present

## 2022-05-19 DIAGNOSIS — Z6829 Body mass index (BMI) 29.0-29.9, adult: Secondary | ICD-10-CM

## 2022-05-19 DIAGNOSIS — Z832 Family history of diseases of the blood and blood-forming organs and certain disorders involving the immune mechanism: Secondary | ICD-10-CM

## 2022-05-19 DIAGNOSIS — E1151 Type 2 diabetes mellitus with diabetic peripheral angiopathy without gangrene: Secondary | ICD-10-CM | POA: Diagnosis not present

## 2022-05-19 DIAGNOSIS — I10 Essential (primary) hypertension: Secondary | ICD-10-CM | POA: Diagnosis not present

## 2022-05-19 DIAGNOSIS — I959 Hypotension, unspecified: Secondary | ICD-10-CM | POA: Diagnosis not present

## 2022-05-19 DIAGNOSIS — E663 Overweight: Secondary | ICD-10-CM | POA: Diagnosis present

## 2022-05-19 DIAGNOSIS — W1830XD Fall on same level, unspecified, subsequent encounter: Secondary | ICD-10-CM | POA: Diagnosis not present

## 2022-05-19 DIAGNOSIS — E739 Lactose intolerance, unspecified: Secondary | ICD-10-CM | POA: Diagnosis present

## 2022-05-19 DIAGNOSIS — H4061X0 Glaucoma secondary to drugs, right eye, stage unspecified: Secondary | ICD-10-CM | POA: Diagnosis not present

## 2022-05-19 DIAGNOSIS — M810 Age-related osteoporosis without current pathological fracture: Secondary | ICD-10-CM | POA: Diagnosis present

## 2022-05-19 DIAGNOSIS — D62 Acute posthemorrhagic anemia: Secondary | ICD-10-CM | POA: Diagnosis not present

## 2022-05-19 DIAGNOSIS — G3184 Mild cognitive impairment, so stated: Secondary | ICD-10-CM | POA: Diagnosis present

## 2022-05-19 DIAGNOSIS — R000 Tachycardia, unspecified: Secondary | ICD-10-CM | POA: Diagnosis present

## 2022-05-19 DIAGNOSIS — Z823 Family history of stroke: Secondary | ICD-10-CM

## 2022-05-19 DIAGNOSIS — J90 Pleural effusion, not elsewhere classified: Secondary | ICD-10-CM | POA: Diagnosis not present

## 2022-05-19 DIAGNOSIS — Z833 Family history of diabetes mellitus: Secondary | ICD-10-CM

## 2022-05-19 DIAGNOSIS — D72823 Leukemoid reaction: Secondary | ICD-10-CM | POA: Diagnosis not present

## 2022-05-19 DIAGNOSIS — R5381 Other malaise: Secondary | ICD-10-CM | POA: Diagnosis present

## 2022-05-19 DIAGNOSIS — K219 Gastro-esophageal reflux disease without esophagitis: Secondary | ICD-10-CM | POA: Diagnosis present

## 2022-05-19 LAB — TYPE AND SCREEN
ABO/RH(D): A POS
Antibody Screen: NEGATIVE
Unit division: 0
Unit division: 0

## 2022-05-19 LAB — BASIC METABOLIC PANEL
Anion gap: 7 (ref 5–15)
BUN: 48 mg/dL — ABNORMAL HIGH (ref 8–23)
CO2: 21 mmol/L — ABNORMAL LOW (ref 22–32)
Calcium: 8 mg/dL — ABNORMAL LOW (ref 8.9–10.3)
Chloride: 118 mmol/L — ABNORMAL HIGH (ref 98–111)
Creatinine, Ser: 3.04 mg/dL — ABNORMAL HIGH (ref 0.44–1.00)
GFR, Estimated: 16 mL/min — ABNORMAL LOW (ref >60)
Glucose, Bld: 82 mg/dL (ref 70–99)
Potassium: 4.2 mmol/L (ref 3.5–5.1)
Sodium: 146 mmol/L — ABNORMAL HIGH (ref 135–145)

## 2022-05-19 LAB — BPAM RBC
Blood Product Expiration Date: 202312012359
Blood Product Expiration Date: 202312012359
ISSUE DATE / TIME: 202311101743
ISSUE DATE / TIME: 202311102057
Unit Type and Rh: 6200
Unit Type and Rh: 6200

## 2022-05-19 LAB — CBC WITH DIFFERENTIAL/PLATELET
Abs Immature Granulocytes: 0.07 10*3/uL (ref 0.00–0.07)
Basophils Absolute: 0 10*3/uL (ref 0.0–0.1)
Basophils Relative: 0 %
Eosinophils Absolute: 0.2 10*3/uL (ref 0.0–0.5)
Eosinophils Relative: 1 %
HCT: 27.7 % — ABNORMAL LOW (ref 36.0–46.0)
Hemoglobin: 9 g/dL — ABNORMAL LOW (ref 12.0–15.0)
Immature Granulocytes: 0 %
Lymphocytes Relative: 8 %
Lymphs Abs: 1.3 10*3/uL (ref 0.7–4.0)
MCH: 25.6 pg — ABNORMAL LOW (ref 26.0–34.0)
MCHC: 32.5 g/dL (ref 30.0–36.0)
MCV: 78.9 fL — ABNORMAL LOW (ref 80.0–100.0)
Monocytes Absolute: 1.2 10*3/uL — ABNORMAL HIGH (ref 0.1–1.0)
Monocytes Relative: 8 %
Neutro Abs: 13.6 10*3/uL — ABNORMAL HIGH (ref 1.7–7.7)
Neutrophils Relative %: 83 %
Platelets: 217 10*3/uL (ref 150–400)
RBC: 3.51 MIL/uL — ABNORMAL LOW (ref 3.87–5.11)
RDW: 19.4 % — ABNORMAL HIGH (ref 11.5–15.5)
WBC: 16.4 10*3/uL — ABNORMAL HIGH (ref 4.0–10.5)
nRBC: 0 % (ref 0.0–0.2)

## 2022-05-19 LAB — GLUCOSE, CAPILLARY
Glucose-Capillary: 86 mg/dL (ref 70–99)
Glucose-Capillary: 126 mg/dL — ABNORMAL HIGH (ref 70–99)
Glucose-Capillary: 223 mg/dL — ABNORMAL HIGH (ref 70–99)
Glucose-Capillary: 216 mg/dL — ABNORMAL HIGH (ref 70–99)
Glucose-Capillary: 219 mg/dL — ABNORMAL HIGH (ref 70–99)

## 2022-05-19 MED ORDER — VITAMIN B-12 1000 MCG PO TABS
1000.0000 ug | ORAL_TABLET | Freq: Every day | ORAL | Status: DC
  Administered 2022-05-20 – 2022-06-06 (×18): 1000 ug via ORAL
  Filled 2022-05-19 (×18): qty 1

## 2022-05-19 MED ORDER — INSULIN ASPART 100 UNIT/ML IJ SOLN
0.0000 [IU] | Freq: Three times a day (TID) | INTRAMUSCULAR | Status: DC
  Administered 2022-05-20: 3 [IU] via SUBCUTANEOUS
  Administered 2022-05-20 – 2022-05-25 (×3): 2 [IU] via SUBCUTANEOUS
  Administered 2022-05-26: 5 [IU] via SUBCUTANEOUS
  Administered 2022-05-26: 8 [IU] via SUBCUTANEOUS
  Administered 2022-05-27: 3 [IU] via SUBCUTANEOUS

## 2022-05-19 MED ORDER — CALCITRIOL 0.25 MCG PO CAPS
0.2500 ug | ORAL_CAPSULE | Freq: Every day | ORAL | Status: DC
  Administered 2022-05-20 – 2022-06-06 (×18): 0.25 ug via ORAL
  Filled 2022-05-19 (×18): qty 1

## 2022-05-19 MED ORDER — SODIUM CHLORIDE 0.9 % IV SOLN
2.0000 g | INTRAVENOUS | Status: AC
  Administered 2022-05-20: 2 g via INTRAVENOUS
  Filled 2022-05-19: qty 20

## 2022-05-19 MED ORDER — PROCHLORPERAZINE EDISYLATE 10 MG/2ML IJ SOLN: 5.0000 mg | Freq: Four times a day (QID) | INTRAMUSCULAR | Status: DC | PRN

## 2022-05-19 MED ORDER — ENOXAPARIN SODIUM 30 MG/0.3ML IJ SOSY: 30.0000 mg | PREFILLED_SYRINGE | INTRAMUSCULAR | Status: DC

## 2022-05-19 MED ORDER — AZITHROMYCIN 500 MG PO TABS
500.0000 mg | ORAL_TABLET | Freq: Every day | ORAL | Status: AC
  Administered 2022-05-20: 500 mg via ORAL
  Filled 2022-05-19: qty 1

## 2022-05-19 MED ORDER — DIPHENHYDRAMINE HCL 12.5 MG/5ML PO ELIX: 12.5000 mg | ORAL_SOLUTION | ORAL | Status: DC | PRN

## 2022-05-19 MED ORDER — AMLODIPINE BESYLATE 5 MG PO TABS
2.5000 mg | ORAL_TABLET | Freq: Every day | ORAL | Status: DC
  Administered 2022-05-19: 2.5 mg via ORAL
  Filled 2022-05-19: qty 1

## 2022-05-19 MED ORDER — DOCUSATE SODIUM 100 MG PO CAPS
100.0000 mg | ORAL_CAPSULE | Freq: Two times a day (BID) | ORAL | Status: DC
  Administered 2022-05-20 – 2022-06-03 (×27): 100 mg via ORAL
  Filled 2022-05-19 (×29): qty 1

## 2022-05-19 MED ORDER — ATORVASTATIN CALCIUM 10 MG PO TABS
20.0000 mg | ORAL_TABLET | Freq: Every day | ORAL | Status: DC
  Administered 2022-05-20 – 2022-06-06 (×18): 20 mg via ORAL
  Filled 2022-05-19 (×18): qty 2

## 2022-05-19 MED ORDER — SODIUM CHLORIDE 0.9 % IV SOLN: INTRAVENOUS | Status: DC

## 2022-05-19 MED ORDER — AMLODIPINE BESYLATE 2.5 MG PO TABS
2.5000 mg | ORAL_TABLET | Freq: Every day | ORAL | Status: DC
  Administered 2022-05-20 – 2022-06-06 (×18): 2.5 mg via ORAL
  Filled 2022-05-19 (×18): qty 1

## 2022-05-19 MED ORDER — TRAMADOL HCL 50 MG PO TABS
50.0000 mg | ORAL_TABLET | Freq: Two times a day (BID) | ORAL | Status: DC | PRN
  Administered 2022-05-19: 50 mg via ORAL
  Filled 2022-05-19: qty 1

## 2022-05-19 MED ORDER — SODIUM CHLORIDE 0.9 % IV SOLN: INTRAVENOUS | Status: DC | PRN

## 2022-05-19 MED ORDER — ACETAMINOPHEN 325 MG PO TABS
325.0000 mg | ORAL_TABLET | ORAL | Status: DC | PRN
  Administered 2022-05-19 – 2022-06-05 (×11): 650 mg via ORAL
  Filled 2022-05-19 (×11): qty 2

## 2022-05-19 MED ORDER — INSULIN ASPART 100 UNIT/ML IJ SOLN
0.0000 [IU] | Freq: Every day | INTRAMUSCULAR | Status: DC
  Administered 2022-05-19 – 2022-05-25 (×4): 2 [IU] via SUBCUTANEOUS

## 2022-05-19 MED ORDER — METHOCARBAMOL 500 MG PO TABS
500.0000 mg | ORAL_TABLET | Freq: Four times a day (QID) | ORAL | Status: DC | PRN
  Administered 2022-05-19 – 2022-05-20 (×3): 500 mg via ORAL
  Filled 2022-05-19 (×3): qty 1

## 2022-05-19 MED ORDER — PROCHLORPERAZINE MALEATE 5 MG PO TABS: 5.0000 mg | ORAL_TABLET | Freq: Four times a day (QID) | ORAL | Status: DC | PRN

## 2022-05-19 MED ORDER — SENNA 8.6 MG PO TABS
1.0000 | ORAL_TABLET | Freq: Two times a day (BID) | ORAL | Status: DC
  Administered 2022-05-20 – 2022-06-03 (×27): 8.6 mg via ORAL
  Filled 2022-05-19 (×29): qty 1

## 2022-05-19 MED ORDER — MAGNESIUM HYDROXIDE 400 MG/5ML PO SUSP: 30.0000 mL | Freq: Every day | ORAL | Status: DC | PRN

## 2022-05-19 MED ORDER — ENOXAPARIN SODIUM 30 MG/0.3ML IJ SOSY
30.0000 mg | PREFILLED_SYRINGE | INTRAMUSCULAR | Status: DC
  Administered 2022-05-20 – 2022-06-06 (×18): 30 mg via SUBCUTANEOUS
  Filled 2022-05-19 (×18): qty 0.3

## 2022-05-19 MED ORDER — INSULIN DETEMIR 100 UNIT/ML ~~LOC~~ SOLN
10.0000 [IU] | Freq: Every day | SUBCUTANEOUS | Status: DC
  Administered 2022-05-20 – 2022-06-06 (×17): 10 [IU] via SUBCUTANEOUS
  Filled 2022-05-19 (×18): qty 0.1

## 2022-05-19 MED ORDER — TRAZODONE HCL 50 MG PO TABS: 25.0000 mg | ORAL_TABLET | Freq: Every evening | ORAL | Status: DC | PRN

## 2022-05-19 MED ORDER — FLEET ENEMA 7-19 GM/118ML RE ENEM: 1.0000 | ENEMA | Freq: Once | RECTAL | Status: DC | PRN

## 2022-05-19 MED ORDER — SORBITOL 70 % SOLN: 30.0000 mL | Freq: Every day | Status: DC | PRN

## 2022-05-19 MED ORDER — PROCHLORPERAZINE 25 MG RE SUPP: 12.5000 mg | Freq: Four times a day (QID) | RECTAL | Status: DC | PRN

## 2022-05-19 MED ORDER — GUAIFENESIN-DM 100-10 MG/5ML PO SYRP: 5.0000 mL | ORAL_SOLUTION | Freq: Four times a day (QID) | ORAL | Status: DC | PRN

## 2022-05-19 MED ORDER — LORATADINE 10 MG PO TABS
10.0000 mg | ORAL_TABLET | Freq: Every day | ORAL | Status: DC
  Administered 2022-05-19 – 2022-05-25 (×7): 10 mg via ORAL
  Filled 2022-05-19 (×7): qty 1

## 2022-05-19 MED ORDER — GABAPENTIN 100 MG PO CAPS
100.0000 mg | ORAL_CAPSULE | Freq: Every day | ORAL | Status: DC
  Administered 2022-05-19 – 2022-06-05 (×18): 100 mg via ORAL
  Filled 2022-05-19 (×18): qty 1

## 2022-05-19 MED ORDER — SODIUM BICARBONATE 650 MG PO TABS
650.0000 mg | ORAL_TABLET | Freq: Two times a day (BID) | ORAL | Status: DC
  Administered 2022-05-19 – 2022-06-06 (×35): 650 mg via ORAL
  Filled 2022-05-19 (×35): qty 1

## 2022-05-19 MED ORDER — METOPROLOL TARTRATE 25 MG PO TABS
25.0000 mg | ORAL_TABLET | Freq: Two times a day (BID) | ORAL | Status: DC
  Administered 2022-05-19 – 2022-05-20 (×2): 25 mg via ORAL
  Filled 2022-05-19 (×2): qty 1

## 2022-05-19 NOTE — Progress Notes (Signed)
VASCULAR LAB    Left lower extremity venous duplex has been performed.  See CV proc for preliminary results.   June Rode, RVT 05/19/2022, 4:04 PM

## 2022-05-19 NOTE — Discharge Instructions (Addendum)
Inpatient Rehab Discharge Instructions  Miranda Roman Discharge date and time:  06/06/2022  Activities/Precautions/ Functional Status: Activity:  nonweight bearing right leg Diet: diabetic diet Wound Care: keep wound clean and dry Functional status:  ___ No restrictions     ___ Walk up steps independently _x__ 24/7 supervision/assistance   ___ Walk up steps with assistance ___ Intermittent supervision/assistance  ___ Bathe/dress independently ___ Walk with walker     ___ Bathe/dress with assistance ___ Walk Independently    ___ Shower independently ___ Walk with assistance    __x_ Shower with assistance __x_ No alcohol     ___ Return to work/school ________  Special Instructions: No driving, alcohol consumption or tobacco use.  Check fingersticks four times a day and record. Take this with you to your primary care provider for follow-up.   COMMUNITY REFERRALS UPON DISCHARGE:    Home Health:   PT      OT      ST                      Agency: [facility]  Phone: [phone] *Please expect follow-up within 2-3 days to schedule your home visit. If you have not received follow-up, be sure to contact the branch directly.*   Outpatient: ST            ** request a referral for outpatient speech services once she is discharge from home health.**  Medical Equipment/Items Ordered: semi-electric hospital bed, drop arm bedside commode, bilateral platform attachments, transfer board, and elevating leg rests                                                 Agency/Supplier: [facility] [phone]   My questions have been answered and I understand these instructions. I will adhere to these goals and the provided educational materials after my discharge from the hospital.  Patient/Caregiver Signature _______________________________ Date __________  Clinician Signature _______________________________________ Date __________  Please bring this form and your medication list with  you to all your follow-up doctor's appointments.

## 2022-05-19 NOTE — H&P (Signed)
Expand All Collapse All      Physical Medicine and Rehabilitation Admission H&P     CC:  Debility secondary to fall resulting in right tib-fib fracture and right wrist fracture   HPI: Miranda Roman is a right-handed 76 year old female brought to the emergency department via EMS from home for mechanical fall on 05/08/2022.[name]  She had physical deformity to the right lower extremity and right wrist.  She had recently moved in with her daughter and typically ambulates with a rolling walker.  She tripped over the family dogs and was complaining of right wrist and right proximal lower leg pain.  Medical history is significant for diabetes mellitus, hypertension and chronic kidney disease stage IV.  Initial imaging revealed no spinal deformity.  Positive for nondisplaced right distal ulnar/radius fracture.  Dr. Greta Doom consulted and fracture is nonoperable and patient placed in splint.  She was admitted and orthopedic surgery consultation obtained.  Imaging revealed right tib-fib fracture and she was placed into a long-leg splint.  On 11/4, she was taken to the operating room by Dr. Mardelle Matte and underwent ORIF with IM nail fixation of right tib-fib fracture and closed management of right distal radius and ulnar fracture.  She developed acute kidney injury with serum creatinine elevated to 3.5.  IVFs continued. Nephrology was consulted on 11/11.  She developed fever with worsening leukocytosis on 11/9.  Urine culture and chest x-ray ordered.  She complained of abdominal pain 11/10 and CT of abdomen pelvis performed. No acute abnormality. Fever worsened to 103.1 on 11/10 and she was tachycardic with increased respiratory rate and desaturating.  Blood cultures obtained.  Empirically started on IV Rocephin and IV azithromycin.  She is nonweightbearing in the right lower extremity and weightbearing as tolerated to the elbow of the right upper extremity.The patient requires inpatient physical medicine and rehabilitation  evaluations and treatment secondary to dysfunction due to fall with RLE and RUE fractures.   Her daughter Miranda Roman is at bedside. Son is to arrive from [place] in the morning. She states her mother was diagnosed with encephalopathy approximately two years ago and has cognitive impairment.    Review of Systems  Constitutional:  Positive for fever. Negative for chills.  Respiratory:  Positive for cough.   Cardiovascular:  Positive for chest pain.       Says chest pain is due to pneumonia   Gastrointestinal:  Positive for nausea. Negative for vomiting.  Genitourinary:  Negative for dysuria and urgency.       Has Purewick in place  Musculoskeletal:  Positive for joint pain.  Neurological:  Negative for dizziness and headaches.        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   Borderline steroid-induced glaucoma, 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    OD, Early elevated intraocular pressure to 28 mm we will discontinue all topical medications from surgery in the right eye today   Bronchiectasis without complication 58/03/9832   Cataract 07/03/2020   Chest pain at  rest 01/30/2015   Chronic kidney disease, stage 4 (severe) 12/07/2020   Closed fracture of right tibial plateau 07/17/2021   Cyst of left kidney 03/10/2019    Needs repeated US 08/2019   Dysphagia, neurologic     Elevated d-dimer 12/20/2019   Facet arthritis of lumbar region 02/27/2019    Gastroesophageal reflux disease 08/26/2016   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  Further history patient suffered possible Wernicke's encephalopathy the past, a type of the vitamin deficiencies, thiamine that can also affect the retina if there is significant damage which may not be recoverable.   Mild neurocognitive disorder due to multiple etiologies 01/23/2022   Osteoporosis 12/15/2018   Overweight with body mass index (BMI) 25.0-29.9 12/15/2018   PAD (peripheral artery disease) 06/06/2019   Pain in right hand 04/27/2020   Pancreatic cyst 06/06/2019   Polyneuropathy due to type 2 diabetes mellitus     Posterior vitreous detachment of left eye 08/05/2021   Proliferative diabetic retinopathy of right eye 08/22/2021    Likely contributory cause of nonclearing vitreous hemorrhage in the right eye now status post PRP likely to resolve in 2 quiescent PDR, post vitrectomy PRP   Pulmonary hypertension 02/04/2021   Right knee pain 07/10/2021   Sickle-cell trait 12/07/2020   Type II diabetes mellitus 03/10/2012   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   Vitreous hemorrhage of right eye 08/05/2021    Vitrectomy PRP 08-14-2021         Past Surgical History:  Procedure Laterality Date   BONE RESECTION   01/2006    wrist   GASTRIC BYPASS   08/14/2009   OPEN REDUCTION INTERNAL FIXATION (ORIF) TIBIA/FIBULA FRACTURE Right 05/10/2022    Procedure: OPEN REDUCTION INTERNAL FIXATION (ORIF) TIBIA/FIBULA  FRACTURE;  Surgeon: Marchia Bond, MD;  Location: [facility];  Service: Orthopedics;  Laterality: Right;   OTHER SURGICAL HISTORY        sickle cell retinopathy laser surgery   TUBAL LIGATION   04/28/1977         Family History  Problem Relation Age of Onset   Diabetes Mother     Heart disease Mother     Hypertension Mother     Stroke Mother     Diabetes Father     Heart disease Father     Diabetes Sister     Diabetes Brother     Hypertension Brother     Sickle cell anemia Brother     Diabetes Maternal Aunt     Diabetes Maternal Uncle     Diabetes Maternal Grandmother     Diabetes Maternal Grandfather     Sickle cell anemia Sister     Sickle cell anemia Sister     Healthy Son     Healthy Son     Healthy Daughter      Social History:  reports that she quit smoking about 17 years ago. Her smoking use included cigarettes. She started smoking about 60 years ago. She has a  10.75 pack-year smoking history. She has never used smokeless tobacco. She reports that she does not currently use alcohol. She reports that she does not use drugs. Allergies:       Allergies  Allergen Reactions   Cefepime Other (See Comments)      Causes encephalitis - reported by [facility] 12/31/2019   Lactose Intolerance (Gi)        Per pt's daughter causes gas          Medications Prior to Admission  Medication Sig Dispense Refill   acetaminophen (TYLENOL) 500 MG tablet Take 1 tablet (500 mg total) by mouth every 6 (six) hours as needed for moderate pain or fever. 30 tablet 0   amLODipine (NORVASC) 2.5 MG tablet Take 2.5 mg by mouth daily.       atorvastatin (LIPITOR) 20 MG tablet TAKE ONE TABLET BY MOUTH MONDAY THRU FRIDAY AND SKIP WEEKENDS (Patient taking differently: Take 20 mg by mouth daily.) 65 tablet 2   AZO-CRANBERRY PO Take 1 capsule by mouth daily at 6 (six) AM.       b complex vitamins capsule Take 1 capsule by mouth daily.       calcitRIOL (ROCALTROL) 0.25 MCG capsule Take 0.25 mcg by mouth  daily.       cetirizine (ZYRTEC) 10 MG tablet Take 10 mg by mouth daily.       Cyanocobalamin (VITAMIN B12) 1000 MCG TBCR Take 1 tablet by mouth daily.       diclofenac Sodium (VOLTAREN) 1 % GEL Apply 2 g topically 4 (four) times daily. Apply to left knee. 50 g 0   gabapentin (NEURONTIN) 100 MG capsule TAKE 1 CAPSULE BY MOUTH AT BEDTIME (Patient taking differently: Take 100 mg by mouth at bedtime.) 90 capsule 2   HUMALOG KWIKPEN 100 UNIT/ML KwikPen INJECT SUBCUTANEOUSLY 4 UNITS 3  TIMES DAILY WITH MEALS (Patient taking differently: Inject 4 Units into the skin daily.) 15 mL 2   insulin detemir (LEVEMIR FLEXTOUCH) 100 UNIT/ML FlexPen Inject 6 Units into the skin daily. 15 mL 3   LEVEMIR 100 UNIT/ML injection INJECT 0.12 MLS (12 UNITS TOTAL) INTO THE SKIN 2 (TWO) TIMES DAILY. (Patient taking differently: Inject 6 Units into the skin daily.) 10 mL 1   metoprolol tartrate (LOPRESSOR) 50 MG tablet TAKE 1 TABLET BY MOUTH TWICE A DAY (Patient taking differently: Take 50 mg by mouth 2 (two) times daily.) 180 tablet 1   Multiple Vitamins-Minerals (CENTRUM SILVER PO) Take by mouth daily.       sodium bicarbonate 650 MG tablet Take 650 mg by mouth 2 (two) times daily.       BD INSULIN SYRINGE U/F 31G X 5/16" 0.3 ML MISC See admin instructions. use with insulin       epoetin alfa (PROCRIT) 23953 UNIT/ML injection Every other week (Patient not taking: Reported on 05/08/2022)       Insulin Pen Needle (BD PEN NEEDLE NANO U/F) 32G X 4 MM MISC Use as directed with insulin pen 150 each 2   OneTouch Delica Lancets 20E MISC See admin instructions.              Home: Home Living Family/patient expects to be discharged to:: Unsure Living Arrangements: Children Available Help at Discharge: Family Type of Home: House Home Access: Stairs to enter Technical brewer of Steps: 3 Entrance Stairs-Rails: None Home Layout: Two level, 1/2 bath on main level, Able to live on main level with bedroom/bathroom Alternate  Level Stairs-Number of Steps:  flight Bathroom Toilet: Handicapped height Bathroom Accessibility: No Additional Comments: shower chair, BSC, RW, uses tub/shower combo though there is a walk in shower in the home, aide from 10-12 M-F  Lives With: Daughter (and SIL)   Functional History: Prior Function Prior Level of Function : Needs assist Physical Assist : Mobility (physical), ADLs (physical) Mobility (physical): Stairs ADLs (physical): Bathing, Dressing Mobility Comments: uses a walker, could walk in the cul-de-sac ADLs Comments: needed assistance for bathing, LB dressing,   Functional Status:  Mobility: Bed Mobility Overal bed mobility: Needs Assistance Bed Mobility: Rolling Rolling: +2 for physical assistance, +2 for safety/equipment, Max assist, Total assist Supine to sit: Max assist, +2 for physical assistance, +2 for safety/equipment Sit to supine: Total assist, +2 for physical assistance, +2 for safety/equipment General bed mobility comments: Pt required Max/Total Assist to transition to EOB with increased time.  Attempted to demonstrate how to use belt to self assist LE however pt offers little self effort.  Utilized bed pad to complete scooting was difficu;t. Sitting EOB x 4 min with poor forward flexed collapsed posture. Transfers Overall transfer level: Needs assistance Equipment used: Right platform walker Transfers: Sit to/from Stand, Bed to chair/wheelchair/BSC Sit to Stand: Total assist, +2 physical assistance, +2 safety/equipment, From elevated surface Bed to/from chair/wheelchair/BSC transfer type:: Via Lift equipment Transfer via Lift Equipment: [place] transfer comment: required + 2 Max Assist to partially rise from bed with poor forward flexed posture and inability to complete 1/4 pivot to reclier or self weight shift. Ambulation/Gait General Gait Details: currently Non Amb   ADL: ADL Overall ADL's : Needs assistance/impaired Eating/Feeding: Set up,  Bed level Grooming: Set up, Bed level Upper Body Bathing: Moderate assistance, Bed level Lower Body Bathing: Total assistance, Bed level Upper Body Dressing : Moderate assistance, Bed level Lower Body Dressing: Total assistance, Bed level Toilet Transfer: Total assistance, BSC/3in1, +2 for physical assistance Toileting- Clothing Manipulation and Hygiene: Total assistance Functional mobility during ADLs: Total assistance, +2 for physical assistance   Cognition: Cognition Overall Cognitive Status: Impaired/Different from baseline Arousal/Alertness: Awake/alert Orientation Level: Oriented X4 Attention: Sustained, Focused Focused Attention: Appears intact Sustained Attention: Impaired Sustained Attention Impairment: Verbal complex Memory: Impaired Memory Impairment: Decreased short term memory Decreased Short Term Memory: Verbal basic Awareness: Impaired Awareness Impairment: Emergent impairment Problem Solving: Impaired Problem Solving Impairment: Verbal complex Executive Function: Organizing Organizing: Impaired Organizing Impairment: Verbal complex Cognition Arousal/Alertness: Awake/alert Behavior During Therapy: WFL for tasks assessed/performed Overall Cognitive Status: Impaired/Different from baseline Area of Impairment: Problem solving Problem Solving: Difficulty sequencing, Requires verbal cues, Requires tactile cues, Slow processing General Comments: Alert and oriented but exhibited slow processing.   Physical Exam: Blood pressure (!) 151/83, pulse (!) 102, temperature 99.8 F (37.7 C), temperature source Oral, resp. rate 20, height _0  (1.626 m), weight 78.9 kg, SpO2 98 %. Physical Exam Constitutional:      Appearance: She is obese.  HENT:     Head: Normocephalic and atraumatic.     Right Ear: External ear normal.     Left Ear: External ear normal.     Nose: Nose normal.     Mouth/Throat:     Mouth: Mucous membranes are moist.  Eyes:     General: No scleral  icterus.    Extraocular Movements: Extraocular movements intact.     Conjunctiva/sclera: Conjunctivae normal.     Pupils: Pupils are equal, round, and reactive to light.  Cardiovascular:     Rate and Rhythm: Normal rate and regular rhythm.  Heart sounds: No murmur heard.    No gallop.  Pulmonary:     Effort: Pulmonary effort is normal. No respiratory distress.     Breath sounds: No wheezing.  Abdominal:     General: Bowel sounds are normal. There is no distension.     Palpations: Abdomen is soft.  Musculoskeletal:     Cervical back: Normal range of motion.     Comments: Right lower leg surgical dressing in place; right forearm splint in place. RUE tender to minimal palpation and movement.  Skin:    General: Skin is warm.  Neurological:     Mental Status: She is alert and oriented to person, place, and time.     Comments: Underlying cognitive impairment. Oriented to person, place, reason she's here. Fair insight and awareness. Normal speech, language. CN exam unremarkable. LUE 4/5. RUE limited by pain/ortho. Does move all muscle groups. LLE 2/5 prox to 4/5 distal. RLE tr-1/5 prox to 3/5 distal (can wiggle toes within splint). No focal sensory loss.   Psychiatric:     Comments: Pt cooperative but somewhat irritable.         Lab Results Last 48 Hours        Results for orders placed or performed during the hospital encounter of 05/08/22 (from the past 48 hour(s))  Glucose, capillary     Status: Abnormal    Collection Time: 05/17/22  5:15 PM  Result Value Ref Range    Glucose-Capillary 164 (H) 70 - 99 mg/dL      Comment: Glucose reference range applies only to samples taken after fasting for at least 8 hours.  Glucose, capillary     Status: Abnormal    Collection Time: 05/17/22  9:44 PM  Result Value Ref Range    Glucose-Capillary 158 (H) 70 - 99 mg/dL      Comment: Glucose reference range applies only to samples taken after fasting for at least 8 hours.  Sodium, urine, random      Status: None    Collection Time: 05/17/22 11:22 PM  Result Value Ref Range    Sodium, Ur 72 mmol/L      Comment: Performed at [facility], [place] [address], [place], [place] [address]  Creatinine, urine, random     Status: None    Collection Time: 05/17/22 11:22 PM  Result Value Ref Range    Creatinine, Urine 41 mg/dL      Comment: Performed at [facility], [place] [address], [place], [place] [address]  Urinalysis, Routine w reflex microscopic Urine, Catheterized     Status: Abnormal    Collection Time: 05/17/22 11:22 PM  Result Value Ref Range    Color, Urine STRAW (A) YELLOW    APPearance CLEAR CLEAR    Specific Gravity, Urine 1.009 1.005 - 1.030    pH 6.0 5.0 - 8.0    Glucose, UA NEGATIVE NEGATIVE mg/dL    Hgb urine dipstick SMALL (A) NEGATIVE    Bilirubin Urine NEGATIVE NEGATIVE    Ketones, ur NEGATIVE NEGATIVE mg/dL    Protein, ur 30 (A) NEGATIVE mg/dL    Nitrite NEGATIVE NEGATIVE    Leukocytes,Ua NEGATIVE NEGATIVE    RBC / HPF 0-5 0 - 5 RBC/hpf    WBC, UA 0-5 0 - 5 WBC/hpf    Bacteria, UA NONE SEEN NONE SEEN    Squamous Epithelial / LPF 0-5 0 - 5      Comment: Performed at [facility], [place]  [place], [place] [address]  Renal function panel     Status: Abnormal    Collection Time: 05/18/22  4:35 AM  Result Value Ref Range    Sodium 143 135 - 145 mmol/L    Potassium 4.2 3.5 - 5.1 mmol/L    Chloride 114 (H) 98 - 111 mmol/L    CO2 20 (L) 22 - 32 mmol/L    Glucose, Bld 120 (H) 70 - 99 mg/dL      Comment: Glucose reference range applies only to samples taken after fasting for at least 8 hours.    BUN 56 (H) 8 - 23 mg/dL    Creatinine, Ser 3.18 (H) 0.44 - 1.00 mg/dL    Calcium 8.0 (L) 8.9 - 10.3 mg/dL    Phosphorus 3.8 2.5 - 4.6 mg/dL    Albumin 2.1 (L) 3.5 - 5.0 g/dL    GFR, Estimated 15 (L) >60 mL/min      Comment: (NOTE) Calculated using the CKD-EPI Creatinine Equation (2021)      Anion gap 9 5 - 15       Comment: Performed at [facility], [place] [address] [place], [place] [address]  CBC with Differential/Platelet     Status: Abnormal    Collection Time: 05/18/22  4:35 AM  Result Value Ref Range    WBC 18.7 (H) 4.0 - 10.5 K/uL    RBC 3.87 3.87 - 5.11 MIL/uL    Hemoglobin 10.0 (L) 12.0 - 15.0 g/dL    HCT 31.2 (L) 36.0 - 46.0 %    MCV 80.6 80.0 - 100.0 fL    MCH 25.8 (L) 26.0 - 34.0 pg    MCHC 32.1 30.0 - 36.0 g/dL    RDW 18.6 (H) 11.5 - 15.5 %    Platelets 210 150 - 400 K/uL    nRBC 0.0 0.0 - 0.2 %    Neutrophils Relative % 81 %    Neutro Abs 15.1 (H) 1.7 - 7.7 K/uL    Lymphocytes Relative 8 %    Lymphs Abs 1.5 0.7 - 4.0 K/uL    Monocytes Relative 9 %    Monocytes Absolute 1.6 (H) 0.1 - 1.0 K/uL    Eosinophils Relative 1 %    Eosinophils Absolute 0.3 0.0 - 0.5 K/uL    Basophils Relative 0 %    Basophils Absolute 0.1 0.0 - 0.1 K/uL    Immature Granulocytes 1 %    Abs Immature Granulocytes 0.16 (H) 0.00 - 0.07 K/uL      Comment: Performed at [facility], [place] [address] [address], [place], [place] [address]  Magnesium     Status: None    Collection Time: 05/18/22  4:35 AM  Result Value Ref Range    Magnesium 2.0 1.7 - 2.4 mg/dL      Comment: Performed at [facility], [place] [address], [place], [place] [address]  Glucose, capillary     Status: Abnormal    Collection Time: 05/18/22  7:58 AM  Result Value Ref Range    Glucose-Capillary 127 (H) 70 - 99 mg/dL      Comment: Glucose reference range applies only to samples taken after fasting for at least 8 hours.  Glucose, capillary     Status: Abnormal    Collection Time: 05/18/22  1:10 PM  Result Value Ref Range    Glucose-Capillary 124 (H) 70 - 99 mg/dL      Comment: Glucose reference range applies only to samples taken after fasting  for at least 8 hours.  Glucose, capillary     Status: Abnormal    Collection Time: 05/18/22  5:25 PM  Result Value Ref Range    Glucose-Capillary  110 (H) 70 - 99 mg/dL      Comment: Glucose reference range applies only to samples taken after fasting for at least 8 hours.  Glucose, capillary     Status: Abnormal    Collection Time: 05/18/22  8:42 PM  Result Value Ref Range    Glucose-Capillary 132 (H) 70 - 99 mg/dL      Comment: Glucose reference range applies only to samples taken after fasting for at least 8 hours.  Basic metabolic panel     Status: Abnormal    Collection Time: 05/19/22  4:27 AM  Result Value Ref Range    Sodium 146 (H) 135 - 145 mmol/L    Potassium 4.2 3.5 - 5.1 mmol/L    Chloride 118 (H) 98 - 111 mmol/L    CO2 21 (L) 22 - 32 mmol/L    Glucose, Bld 82 70 - 99 mg/dL      Comment: Glucose reference range applies only to samples taken after fasting for at least 8 hours.    BUN 48 (H) 8 - 23 mg/dL    Creatinine, Ser 3.04 (H) 0.44 - 1.00 mg/dL    Calcium 8.0 (L) 8.9 - 10.3 mg/dL    GFR, Estimated 16 (L) >60 mL/min      Comment: (NOTE) Calculated using the CKD-EPI Creatinine Equation (2021)      Anion gap 7 5 - 15      Comment: Performed at [facility], [place] [address], [place], [place] [address]  Glucose, capillary     Status: None    Collection Time: 05/19/22  7:47 AM  Result Value Ref Range    Glucose-Capillary 86 70 - 99 mg/dL      Comment: Glucose reference range applies only to samples taken after fasting for at least 8 hours.  CBC with Differential/Platelet     Status: Abnormal    Collection Time: 05/19/22 10:29 AM  Result Value Ref Range    WBC 16.4 (H) 4.0 - 10.5 K/uL    RBC 3.51 (L) 3.87 - 5.11 MIL/uL    Hemoglobin 9.0 (L) 12.0 - 15.0 g/dL    HCT 27.7 (L) 36.0 - 46.0 %    MCV 78.9 (L) 80.0 - 100.0 fL    MCH 25.6 (L) 26.0 - 34.0 pg    MCHC 32.5 30.0 - 36.0 g/dL    RDW 19.4 (H) 11.5 - 15.5 %    Platelets 217 150 - 400 K/uL    nRBC 0.0 0.0 - 0.2 %    Neutrophils Relative % 83 %    Neutro Abs 13.6 (H) 1.7 - 7.7 K/uL    Lymphocytes Relative 8 %    Lymphs Abs 1.3 0.7 - 4.0 K/uL     Monocytes Relative 8 %    Monocytes Absolute 1.2 (H) 0.1 - 1.0 K/uL    Eosinophils Relative 1 %    Eosinophils Absolute 0.2 0.0 - 0.5 K/uL    Basophils Relative 0 %    Basophils Absolute 0.0 0.0 - 0.1 K/uL    Immature Granulocytes 0 %    Abs Immature Granulocytes 0.07 0.00 - 0.07 K/uL      Comment: Performed at [facility], [place] [address], [place], [place] [address]      Imaging Results (Last [phone]  hours)  No results found.         Blood pressure (!) 151/83, pulse (!) 102, temperature 99.8 F (37.7 C), temperature source Oral, resp. rate 20, height 5' 4" (1.626 m), weight 78.9 kg, SpO2 98 %.   Medical Problem List and Plan: 1. Functional deficits secondary to multiple trauma after ground level fall             -patient may not yet shower             -ELOS/Goals: 12-14 days, min assist to supervision goals 2.  Antithrombotics: -DVT/anticoagulation:  Pharmaceutical: Lovenox 30 mg daily -venous dopplers appear normal (where able to test given RLE splint)             -antiplatelet therapy: none 3. Pain Management: Tylenol, tramadol, Robaxin prn 4. Mood/Behavior/Sleep: LCSW to evaluate and provide emotional support             -antipsychotic agents: n/a 5. Neuropsych/cognition: This patient is not capable of making decisions on her own behalf. Consider SLP eval. 6. Skin/Wound Care: Routine skin care checks             -monitor surgical incision 7. Fluids/Electrolytes/Nutrition: Routine Is and Os and follow-up chemistries             -ensure adequate PO hydration 8: Right tib-fib fracture s/p ORIF 11/2 -NWB RLE  -follow-up with Dr. Mardelle Matte 9: Right non-displaced wrist fracture: WBAT tolerated through elbow             -maintain wrist splint             -follow-up with Dr. Greta Doom 10: Pneumonia: continue ceftriaxone and azithromycin             -cough/deep breath/IS/FV 11: Hypertension: monitor TID and prn 12: CKD 4: continue calcitrol and sodium bicarb 13:  Hyperlipidemia: continue Lipitor 14: DM2: CBGs 4 times daily             -continue Levemir 10 units daily             -continue sliding scale             -consider restarting home insulin 4 units with meals 15: Constipation: continue senna and Colace daily; prns 16: Anemia; multi-factorial: follow-up CBC 17: Leukocytosis/low grade fever: abx for pneumonia continue 18: AKI: serum creatinine improving, still elevated (baseline 2.3-2.6)             -follow-up on BMP     Barbie Banner, PA-C 05/19/2022  I have personally performed a face to face diagnostic evaluation of this patient and formulated the key components of the plan.  Additionally, I have personally reviewed laboratory data, imaging studies, as well as relevant notes and concur with the physician assistant's documentation above.  The patient's status has not changed from the original H&P.  Any changes in documentation from the acute care chart have been noted above.  Meredith Staggers, MD, Mellody Drown

## 2022-05-19 NOTE — Progress Notes (Signed)
PMR Admission Coordinator Pre-Admission Assessment   Patient: Miranda Roman is an 76 y.o., female MRN: [id number] DOB: 29-Jun-1946 Height: _0  (162.6 cm) Weight: 78.2 kg   Insurance Information HMO: yes    PPO:      PCP:      IPA:      80/20:      OTHER:  PRIMARY: [employer] MCR      Policy#: [id number]      Subscriber: patient CM Name: tbd    Phone#: online- [email or web address]     Fax#: [phone] Pre-Cert#: [id number]  Approval received from [facility] for 7 days 05/17/22-05/23/22. Please fax to continue stay request to above fax number.     Eff. Date: 12/05/21     Deduct: $0      Out of Pocket Max: $4,500 ($2,026.49 met)       CIR: $325/day copay days 1-5, $0 day/days 6+      SNF: $0/day days 1-20, $196/day days 21-43, $0/day days 44-100, limited to 100 days/cal year Outpatient: $20 co-pay/visit      Home Health: 100% coverage     DME: 80% coverage     20% co-insurance Providers: patient choice   The "Data Collection Information Summary" for patients in Inpatient Rehabilitation Facilities with attached "Privacy Act [place] Records" was provided and verbally reviewed with: Family   Emergency Contact Information Contact Information       Name Relation Home Work [employer] Daughter [phone]   [phone]    Kawthar, Ennen [phone]   [phone]    Danija, Gosa [phone]   [phone]           Current Medical History  Patient Admitting Diagnosis: Right tib/fib fracture, R wrist fracture History of Present Illness: Patient presented to [facility] 05/08/22 and was tripped by 2 dogs today and fell. Did not hit her head or lose consciousness. She normally ambulates with walker but was not doing so at the time. Not on aspirin or anticoagulation. Not complaining of much pain other than left wrist pain.  WBC 12.4. Hgb of 10.4. Right Tib/Fib X-ray showed acute mildly to moderately comminuted and displaced fracture of the proximal tibial metadiaphysis.  Acute mildly displaced and mild impacted fx of the proximal fibular metaphysis. Right wrist with acute fracture of the distal radial metaphysis with mild volar apex angulation. Acute nondisplaced fx of the distal ulnar metaphysis. Hand surgery Dr. Greta Doom consulted. Fractures are non-operable and place on splint. S/P right LE ORIF 05/10/22. Patient is NWB on R LE, NWB on right wirst. Can bear weight through right elbow. Patient with acute renal failure Cr up to 3.08. Patient developed fever of 103 degrees 05/16/22 and is now on IV antibiotics with diagnosis of pneumonia. Therapies recommending intensive rehab.    Patient's medical record from [facility] has been reviewed by the rehabilitation admission coordinator and physician.   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   Borderline steroid-induced glaucoma, 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    OD, Early elevated intraocular pressure to 28 mm we will discontinue all topical medications from surgery in the right eye today   Bronchiectasis without complication 14/97/0263   Cataract 07/03/2020   Chest pain at rest 01/30/2015   Chronic kidney disease, stage 4 (severe) 12/07/2020   Closed fracture of right tibial plateau 07/17/2021   Cyst of left kidney 03/10/2019    Needs repeated US 08/2019   Dysphagia, neurologic     Elevated d-dimer 12/20/2019   Facet arthritis of lumbar region 02/27/2019    Gastroesophageal reflux disease 08/26/2016   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  Further history patient suffered possible Wernicke's encephalopathy the past, a type of the vitamin deficiencies, thiamine that can also affect the retina if there is significant damage which may not be recoverable.   Mild neurocognitive disorder due to multiple etiologies 01/23/2022   Osteoporosis 12/15/2018   Overweight with body mass index (BMI) 25.0-29.9 12/15/2018   PAD (peripheral artery disease) 06/06/2019   Pain in right hand 04/27/2020   Pancreatic cyst 06/06/2019   Polyneuropathy due to type 2 diabetes mellitus     Posterior vitreous detachment of left eye 08/05/2021   Proliferative diabetic retinopathy of right eye 08/22/2021    Likely contributory cause of nonclearing vitreous hemorrhage in the right eye now status post PRP likely to resolve in 2 quiescent PDR, post vitrectomy PRP   Pulmonary hypertension 02/04/2021   Right knee pain 07/10/2021   Sickle-cell trait 12/07/2020   Type II diabetes mellitus 03/10/2012   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   Vitreous hemorrhage of right eye 08/05/2021    Vitrectomy PRP 08-14-2021      Has the patient had major surgery during 100 days prior to admission? Yes   Family History   family history includes Diabetes in her brother, father, maternal aunt, maternal grandfather, maternal grandmother, maternal uncle, mother, and sister; Healthy in her  daughter, son, and son; Heart disease in her father and mother; Hypertension in her brother and mother; Sickle cell anemia in her brother, sister, and sister; Stroke in her mother.   Current Medications   Current Facility-Administered Medications:    0.9 %  sodium chloride infusion, , Intravenous, PRN, Donne Hazel, MD, Stopped at 05/10/22 1825   acetaminophen (TYLENOL) tablet 325-650 mg, 325-650 mg, Oral, Q6H PRN, [name], PA-C, 650 mg at 05/12/22 1021   amLODipine (NORVASC) tablet 2.5 mg, 2.5 mg, Oral, Daily, Brown, Blaine K, PA-C, 2.5 mg at 05/12/22 0931   atorvastatin (LIPITOR) tablet 20 mg, 20 mg, Oral, Daily, Brown, Blaine K, PA-C, 20 mg at 05/12/22 7858   bisacodyl (DULCOLAX) suppository 10 mg, 10 mg, Rectal, Daily PRN, Owens Shark, Blaine K, PA-C   calcitRIOL (ROCALTROL) capsule 0.25 mcg, 0.25 mcg, Oral, Daily, Brown, Blaine K, PA-C, 0.25 mcg at 05/12/22 0932   cyanocobalamin (VITAMIN B12) tablet 1,000 mcg, 1,000 mcg, Oral, Daily, [name], PA-C,  1,000 mcg at 05/12/22 0929   diphenhydrAMINE (BENADRYL) 12.5 MG/5ML elixir 12.5-25 mg, 12.5-25 mg, Oral, Q4H PRN, [name], PA-C, 25 mg at 05/10/22 2135   docusate sodium (COLACE) capsule 100 mg, 100 mg, Oral, BID, Brown, Blaine K, PA-C, 100 mg at 05/12/22 0931   enoxaparin (LOVENOX) injection 30 mg, 30 mg, Subcutaneous, Q24H, Brown, Blaine K, PA-C, 30 mg at 05/12/22 0932   gabapentin (NEURONTIN) capsule 100 mg, 100 mg, Oral, QHS, Brown, Blaine K, PA-C, 100 mg at 05/11/22 2133   insulin aspart (novoLOG) injection 0-15 Units, 0-15 Units, Subcutaneous, TID WC, Donne Hazel, MD, 3 Units at 05/12/22 1314   insulin aspart (novoLOG) injection 0-5 Units, 0-5 Units, Subcutaneous, QHS, Donne Hazel, MD   insulin detemir (LEVEMIR) injection 6 Units, 6 Units, Subcutaneous, Daily, Donne Hazel, MD, 6 Units at 05/12/22 0932   lactated ringers infusion, , Intravenous, Continuous, Donne Hazel, MD, Last Rate: 50 mL/hr at 05/12/22  0745, Rate Change at 05/12/22 0745   loratadine (CLARITIN) tablet 10 mg, 10 mg, Oral, Daily, Owens Shark, Blaine K, PA-C, 10 mg at 05/11/22 2135   magnesium citrate solution 1 Bottle, 1 Bottle, Oral, Once PRN, Owens Shark, Blaine K, PA-C   methocarbamol (ROBAXIN) tablet 500 mg, 500 mg, Oral, Q6H PRN, 500 mg at 05/12/22 1020 **OR** methocarbamol (ROBAXIN) 500 mg in dextrose 5 % 50 mL IVPB, 500 mg, Intravenous, Q6H PRN, Owens Shark, Blaine K, PA-C   metoCLOPramide (REGLAN) tablet 5-10 mg, 5-10 mg, Oral, Q8H PRN **OR** metoCLOPramide (REGLAN) injection 5-10 mg, 5-10 mg, Intravenous, Q8H PRN, Owens Shark, Blaine K, PA-C   metoprolol tartrate (LOPRESSOR) tablet 50 mg, 50 mg, Oral, BID, Brown, Blaine K, PA-C, 50 mg at 05/12/22 0929   morphine (PF) 2 MG/ML injection 0.5-1 mg, 0.5-1 mg, Intravenous, Q4H PRN, Owens Shark, Blaine K, PA-C, 0.5 mg at 05/11/22 2238   ondansetron (ZOFRAN) tablet 4 mg, 4 mg, Oral, Q6H PRN **OR** ondansetron (ZOFRAN) injection 4 mg, 4 mg, Intravenous, Q6H PRN, Owens Shark, Blaine K, PA-C   polyethylene glycol (MIRALAX / GLYCOLAX) packet 17 g, 17 g, Oral, Daily PRN, Owens Shark, Blaine K, PA-C   senna (SENOKOT) tablet 8.6 mg, 1 tablet, Oral, BID, Brown, Blaine K, PA-C, 8.6 mg at 05/12/22 0930   sodium bicarbonate tablet 650 mg, 650 mg, Oral, BID, [name] K, PA-C, 650 mg at 05/12/22 0931   traMADol (ULTRAM) tablet 50 mg, 50 mg, Oral, Q12H PRN, [name], PA-C, 50 mg at 05/12/22 0018   Patients Current Diet:  Diet Order                  Diet Carb Modified Fluid consistency: Thin; Room service appropriate? Yes  Diet effective now                         Precautions / Restrictions Precautions Precautions: Fall Precaution Comments: R knee pain - hold leg up Restrictions Weight Bearing Restrictions: Yes RUE Weight Bearing: Non weight bearing RLE Weight Bearing: Non weight bearing Other Position/Activity Restrictions: Can bear weight through right elbow, not wrist    Has the patient had 2 or more  falls or a fall with injury in the past year? Yes   Prior Activity Level   Prior Functional Level Self Care: Did the patient need help bathing, dressing, using the toilet or eating? Needed some help   Indoor Mobility: Did the patient need assistance with walking from room to room (with or without device)? Needed  some help   Stairs: Did the patient need assistance with internal or external stairs (with or without device)? Needed some help   Functional Cognition: Did the patient need help planning regular tasks such as shopping or remembering to take medications? Needed some help   Patient Information Are you of Hispanic, Latino/a,or Spanish origin?: A. No, not of Hispanic, Latino/a, or Spanish origin What is your race?: B. Black or African American Do you need or want an interpreter to communicate with a doctor or health care staff?: 0. No   Patient's Response To:  Health Literacy and Transportation Is the patient able to respond to health literacy and transportation needs?: Yes Health Literacy - How often do you need to have someone help you when you read instructions, pamphlets, or other written material from your doctor or pharmacy?: Sometimes In the past 12 months, has lack of transportation kept you from medical appointments or from getting medications?: No In the past 12 months, has lack of transportation kept you from meetings, work, or from getting things needed for daily living?: No   Home Assistive Devices / [place] Devices/Equipment: Environmental consultant (specify type), Cane (specify quad or straight), Wheelchair, Bedside commode/3-in-1, Shower chair without back, CBG Meter, Eyeglasses (shower bench - 1/2 has back and 1/2 is without a back)   Prior Device Use: Indicate devices/aids used by the patient prior to current illness, exacerbation or injury? Walker   Current Functional Level Cognition   Overall Cognitive Status: Within Functional Limits for tasks  assessed Orientation Level: Oriented X4 General Comments: AxO x 3 very pleasant Lady with mild fear of falling as well as fear of more pain.    Extremity Assessment (includes Sensation/Coordination)   Upper Extremity Assessment: RUE deficits/detail, LUE deficits/detail RUE Deficits / Details: Splint in place from palm to elbow, functional shoulder ROM and strength LUE Deficits / Details: WFL ROM and strength LUE Sensation: WNL LUE Coordination: WNL  Lower Extremity Assessment: Defer to PT evaluation     ADLs   Overall ADL's : Needs assistance/impaired Eating/Feeding: Set up, Bed level Grooming: Set up, Bed level Upper Body Bathing: Moderate assistance, Bed level Lower Body Bathing: Total assistance, Bed level Upper Body Dressing : Moderate assistance, Bed level Lower Body Dressing: Total assistance, Bed level Toilet Transfer: Total assistance, BSC/3in1, +2 for physical assistance Toileting- Clothing Manipulation and Hygiene: Total assistance Functional mobility during ADLs: Total assistance, +2 for physical assistance     Mobility   Overal bed mobility: Needs Assistance Bed Mobility: Supine to Sit Supine to sit: Max assist, +2 for physical assistance, +2 for safety/equipment Sit to supine: Total assist, +2 for physical assistance, +2 for safety/equipment General bed mobility comments: 75% VC's on proper tech, increased time, Educated on use of belt to self guide R LE but over all difficult due to R UE in splint @ WBing thru elbow only.  Utilized bed pad to complete scooting.  Mild c/o dizziness EOB which decreased with increased rest period.     Transfers   Overall transfer level: Needs assistance Equipment used: Right platform walker Transfers: Sit to/from Stand Sit to Stand: Total assist, +2 physical assistance, +2 safety/equipment, From elevated surface General transfer comment: attempted sit to stand twice requiring + 2 MAX side by side Assist from elevated bed.  Pt unable to  fully support her weight through R LE and present with B UE weakness even despite using R Platform.  Unable to support self.  Performed a partial 1/4 pivot towards  her LEFT to recliner.  Rec Maxi Move back to bed.     Ambulation / Gait / Stairs / Wheelchair Mobility   Ambulation/Gait General Gait Details: Nt-pt unable     Posture / Balance Balance Overall balance assessment: Needs assistance Sitting-balance support: No upper extremity supported Sitting balance-Leahy Scale: Fair Standing balance-Leahy Scale: Zero     Special needs/care consideration Skin surgical wound    Previous Home Environment (from acute therapy documentation) Living Arrangements: Children  Lives With: Daughter Available Help at Discharge: Family, Available 24 hours/day, Other (Comment) (has aide) Type of Home: House Home Layout: Two level, 1/2 bath on main level, Able to live on main level with bedroom/bathroom Alternate Level Stairs-Number of Steps: flight Home Access: Stairs to enter Entrance Stairs-Rails: None Entrance Stairs-Number of Steps: 3 Bathroom Toilet: Handicapped height Bathroom Accessibility: No Home Care Services: Yes Type of Home Care Services: [place] (if known): private Additional Comments: shower chair, BSC, RW, uses tub/shower combo though there is a walk in shower in the home, aide from 10-12 M-F   Discharge Living Setting Plans for Discharge Living Setting: Lives with (comment), House (daughter) Discharge Home Layout: Two level, 1/2 bath on main level Alternate Level Stairs-Number of Steps: flight Discharge Home Access: Stairs to enter Entrance Stairs-Rails: None Entrance Stairs-Number of Steps: 3 Discharge Bathroom Toilet: Handicapped height Discharge Bathroom Accessibility: No Does the patient have any problems obtaining your medications?: No   Social/Family/Support Systems Anticipated Caregiver: Daughter Research scientist (physical sciences)  Information: [phone] Caregiver Availability: 24/7 Discharge Plan Discussed with Primary Caregiver: Yes Is Caregiver In Agreement with Plan?: Yes Does Caregiver/Family have Issues with Lodging/Transportation while Pt is in Rehab?: No   Goals Patient/Family Goal for Rehab: Min A PT, OT Expected length of stay: 12-14 days Pt/Family Agrees to Admission and willing to participate: Yes Program Orientation Provided & Reviewed with Pt/Caregiver Including Roles  & Responsibilities: Yes  Barriers to Discharge: Insurance for SNF coverage   Decrease burden of Care through IP rehab admission: Othern/a   Possible need for SNF placement upon discharge: not aniticpated   Patient Condition: I have reviewed medical records from [facility], spoken with  TOC , and daughter. I discussed via phone for inpatient rehabilitation assessment.  Patient will benefit from ongoing PT and OT, can actively participate in 3 hours of therapy a day 5 days of the week, and can make measurable gains during the admission.  Patient will also benefit from the coordinated team approach during an Inpatient Acute Rehabilitation admission.  The patient will receive intensive therapy as well as Rehabilitation physician, nursing, social worker, and care management interventions.  Due to safety, skin/wound care, disease management, medication administration, pain management, and patient education the patient requires 24 hour a day rehabilitation nursing.  The patient is currently Max A with mobility and basic ADLs.  Discharge setting and therapy post discharge at home with home health is anticipated.  Patient has agreed to participate in the Acute Inpatient Rehabilitation Program and will admit 05/19/22.   Preadmission Screen Completed By:  Nelly Laurence, 05/12/2022 3:41 PM ______________________________________________________________________   Discussed status with Dr. Naaman Plummer on 05/19/22 at 10:00 am  and received approval for  admission today.   Admission Coordinator:  Nelly Laurence, time 10:39 am/Date 05/19/22    Assessment/Plan: Diagnosis:right tib/fib and wrist fx's after fall Does the need for close, 24 hr/day Medical supervision in concert with the patient's rehab needs make it unreasonable for this patient to  be served in a less intensive setting? Yes Co-Morbidities requiring supervision/potential complications: pain mgt, Acute renal failure, fever/pneumonia Due to bladder management, bowel management, safety, skin/wound care, disease management, medication administration, pain management, and patient education, does the patient require 24 hr/day rehab nursing? Yes Does the patient require coordinated care of a physician, rehab nurse, PT, OT to address physical and functional deficits in the context of the above medical diagnosis(es)? Yes Addressing deficits in the following areas: balance, endurance, locomotion, strength, transferring, bowel/bladder control, bathing, dressing, feeding, grooming, toileting, and psychosocial support Can the patient actively participate in an intensive therapy program of at least 3 hrs of therapy 5 days a week? Yes The potential for patient to make measurable gains while on inpatient rehab is excellent Anticipated functional outcomes upon discharge from inpatient rehab: supervision and min assist PT, supervision and min assist OT, n/a SLP Estimated rehab length of stay to reach the above functional goals is: 12-14 days Anticipated discharge destination: Home 10. Overall Rehab/Functional Prognosis: excellent     MD Signature: Meredith Staggers, MD, [place] Director Rehabilitation Services 05/19/2022

## 2022-05-19 NOTE — Progress Notes (Signed)
Report called, report given to [place], RN Jerene Pitch

## 2022-05-19 NOTE — Progress Notes (Signed)
Patient arrived on unit via [facility], oriented to unit. Reviewed medications, therapy schedule, rehab routine and plan of care with patient and daughter. States an understanding of information reviewed. No complications noted at this time. Patient reports 4 out of 10 R leg pain Miranda Roman

## 2022-05-19 NOTE — Discharge Summary (Cosign Needed)
Physician Discharge Summary  Patient ID: Miranda Roman MRN: [id number] DOB/AGE: 08-25-1945 76 y.o.  Admit date: 05/19/2022 Discharge date: 06/06/2022  Discharge Diagnoses:  Principal Problem:   Critical polytrauma Active Problems:   Right tibial fracture   Mild cognitive disorder Debility secondary to right tib-fib fracture and right wrist fracture Pneumonia Chest wall pain Hypertension Chronic kidney disease stage IV Hyperlipidemia Diabetes mellitus type II Leukocytosis Anemia Acute kidney injury Confusion UTI  Discharged Condition: stable  Significant Diagnostic Studies: VAS [place] LOWER EXTREMITY VENOUS (DVT)  Result Date: 05/19/2022  Lower Venous DVT Study Patient Name:  Miranda Roman  Date of Exam:   05/19/2022 Medical Rec #: [id number]         Accession #:    [id number] Date of Birth: 05/20/1946        Patient Gender: F Patient Age:   24 years Exam Location:  [facility] Procedure:      VAS [place] LOWER EXTREMITY VENOUS (DVT) Referring Phys: Risa Grill --------------------------------------------------------------------------------  Indications: Swelling.  Risk Factors: Status post ORIF for Tibia/Fibula fracture. Limitations: Bandages, pain with compression. Comparison Study: No prior Performing Technologist: Sharion Dove RVS  Examination Guidelines: A complete evaluation includes B-mode imaging, spectral Doppler, color Doppler, and power Doppler as needed of all accessible portions of each vessel. Bilateral testing is considered an integral part of a complete examination. Limited examinations for reoccurring indications may be performed as noted. The reflux portion of the exam is performed with the patient in reverse Trendelenburg.  +---------+---------------+---------+-----------+----------+-------------------+ RIGHT    CompressibilityPhasicitySpontaneityPropertiesThrombus Aging       +---------+---------------+---------+-----------+----------+-------------------+ CFV      Full           Yes      Yes                                      +---------+---------------+---------+-----------+----------+-------------------+ SFJ      Full                                                             +---------+---------------+---------+-----------+----------+-------------------+ FV Prox  Full                                                             +---------+---------------+---------+-----------+----------+-------------------+ FV Mid   Full                                                             +---------+---------------+---------+-----------+----------+-------------------+ FV DistalFull                                                             +---------+---------------+---------+-----------+----------+-------------------+ PFV  Full                                                             +---------+---------------+---------+-----------+----------+-------------------+ POP                                                   Not well visualized +---------+---------------+---------+-----------+----------+-------------------+ PTV                                                   Not well visualized +---------+---------------+---------+-----------+----------+-------------------+ PERO                                                  Not well visualized +---------+---------------+---------+-----------+----------+-------------------+   +----+---------------+---------+-----------+----------+--------------+ LEFTCompressibilityPhasicitySpontaneityPropertiesThrombus Aging +----+---------------+---------+-----------+----------+--------------+ CFV Full           Yes      Yes                                 +----+---------------+---------+-----------+----------+--------------+      *See table(s) above for measurements and  observations.    Preliminary     Labs:  Basic Metabolic Panel: Recent Labs  Lab 06/03/22 0753 06/05/22 0711  NA 141 141  K 5.0 3.9  CL 111 107  CO2 18* 23  GLUCOSE 113* 114*  BUN 47* 37*  CREATININE 2.80* 2.64*  CALCIUM 8.6* 8.7*    CBC: Recent Labs  Lab 06/03/22 1054 06/05/22 0711  WBC 14.5* 8.8  NEUTROABS 10.9*  --   HGB 8.8* 8.9*  HCT 27.9* 27.0*  MCV 78.4* 77.1*  PLT 408* 368    CBG: Recent Labs  Lab 06/05/22 1207 06/05/22 1609 06/05/22 2040 06/06/22 0635 06/06/22 1134  GLUCAP 124* 165* 132* 101* 152*    Brief HPI:   Miranda Roman is a 76 y.o. female brought to the emergency department via EMS from home for mechanical fall on 05/08/2022.[name]  She had physical deformity to the right lower extremity and right wrist.  She had recently moved in with her daughter and typically ambulates with a rolling walker.  She tripped over the family dogs and was complaining of right wrist and right proximal lower leg pain.  Medical history is significant for diabetes mellitus, hypertension and chronic kidney disease stage IV.  Initial imaging revealed no spinal deformity.  Positive for nondisplaced right distal ulnar/radius fracture.  Dr. Greta Doom consulted and fracture is nonoperable and patient placed in splint.  She was admitted and orthopedic surgery consultation obtained.  Imaging revealed right tib-fib fracture and she was placed into a long-leg splint.  On 11/4, she was taken to the operating room by Dr. Mardelle Matte and underwent ORIF with IM nail fixation of right tib-fib fracture and closed management of right distal radius and ulnar fracture.  She developed acute kidney injury with serum creatinine elevated  to 3.5.  IVFs continued. Nephrology was consulted on 11/11.  She developed fever with worsening leukocytosis on 11/9.  Urine culture and chest x-ray ordered.  She complained of abdominal pain 11/10 and CT of abdomen pelvis performed. No acute abnormality. Fever worsened to 103.1 on  11/10 and she was tachycardic with increased respiratory rate and desaturating.  Blood cultures obtained.  Empirically started on IV Rocephin and IV azithromycin.  She is nonweightbearing in the right lower extremity and weightbearing as tolerated to the elbow of the right upper extremity.    Hospital Course: Miranda Roman was admitted to rehab 05/19/2022 for inpatient therapies to consist of PT, ST and OT at least three hours five days a week. Past admission physiatrist, therapy team and rehab RN have worked together to provide customized collaborative inpatient rehab. She had initial complaints of rib cage pain secondary to her pneumonia. Given Lidoderm patches and low dose Norco. Poor appetite.  Nephrology team follow-up on 11/14.  Downward trend and white blood cell count and serum creatinine on follow-up labs 11/14. Antibiotics discontinued.   Family and nursing reporting large volume urine incontinence and some mild change in mental status from baseline. UA performed without indication of infection on 11/17. Creatinine slightly back up and oral fluids pushed. Neuropsychologic consult 11/20 and patient started on Prozac 10 mg daily. Tried Tradjenta for glucose spikes after meals but dropped too low and therefore discontinued and SSI changed from moderate to sensitive on 11/21. Purewick continued at nighttime due to incontinence. WBC normal on 11/20. Follow-up labs on 11/23 with elevated BUN/Cr and started back on IVFs.   Left toe pain reported on 11/25. CBC, uric acid checked>>negative. Started Voltaren gel. IVFs discontinued.Complained of chest pain similar to when she was first diagnosed with pneumonia. Chest x-ray negative. Gas cramps given Mylicon and sorbitol 60/10. WBC back up to 14.5 on 11/28 UA and culture obtained. Started Keflex. Culture positive for Pseudomonas and changed to Cipro.  Blood pressures were monitored on TID basis and remained stable.  Diabetes has been monitored with ac/hs  CBG checks and SSI was use prn for tighter BS control. Continued Levemir 10 units daily. Tried Tradjenta for glucose spikes after meals but dropped too low and therefore discontinued and SSI changed from moderate to sensitive on 11/21.   Rehab course: During patient's stay in rehab weekly team conferences were held to monitor patient's progress, set goals and discuss barriers to discharge. At admission, patient required total assistance with mobility and basic self-care skills.  She has had improvement in activity tolerance, balance, postural control as well as ability to compensate for deficits. She has had improvement in functional use RUE/LUE  and RLE/LLE as well as improvement in awareness. 11/30: OT intervention with focus on bed mobility, BADLs, washing hair, sitting balance, SB transfers, ongoing discharge planning. Rolling to Rt with CGA and min A for rolling to Lt in bed using bed rails. See below for bathing/dressing assist levels. Pt assisted with washing hair at sink. Somewhat limited participation 2/2 RUE limited AROM. SB transfer to w/c with max A.  11/296: PTA demonstrated placing foot under pt's RLE to decrease opportunity to weight bear. Pt then expressed need for toilet. Son the set up Slide board and was able to perform transfer to L maxA x 1 to w/c. Pt then set up and transported back to room. PTA then demonstrated DABSC and son and dgt then performed Slide board transfer to [facility]. Pt then performed Sit to stand  maxA x 2 with PTA and dgt with dgt providing total A for clothing management. Pt with continent urinary void and BM, then performed Sit to stand in same manner to allow PTA to complete peri-care while dgt completed clothing management. Slide board transfer then completed with son and dgt then transferred back to bed via Slide board with son       Disposition: Home  Diet: Carb modified  Special Instructions: No driving, alcohol consumption or tobacco use.  Follow-up with  Dr. Shelda Altes regarding Procrit injection.  30-35 minutes were spent on discharge planning and discharge summary.  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 What changed:  medication strength how much to take when to take this reasons to take this   amLODipine 2.5 MG tablet Commonly known as: NORVASC Take 2.5 mg by mouth daily.   atorvastatin 20 MG tablet Commonly known as: LIPITOR TAKE ONE TABLET BY MOUTH MONDAY THRU FRIDAY AND SKIP WEEKENDS What changed:  how much to take how to take this when to take this additional instructions   AZO-CRANBERRY PO Take 1 capsule by mouth daily at 6 (six) AM.   BD Insulin Syringe U/F 31G X 5/16" 0.3 ML Misc Generic drug: Insulin Syringe-Needle U-100 See admin instructions. use with insulin   BD Pen Needle Nano U/F 32G X 4 MM Misc Generic drug: Insulin Pen Needle Use as directed with insulin pen   calcitRIOL 0.25 MCG capsule Commonly known as: ROCALTROL Take 0.25 mcg by mouth daily.   CENTRUM SILVER PO Take by mouth daily.   cetirizine 10 MG tablet Commonly known as: ZYRTEC Take 10 mg by mouth daily.   ciprofloxacin 500 MG tablet Commonly known as: CIPRO Take 1 tablet (500 mg total) by  mouth daily with breakfast for 6 days.   cyanocobalamin 1000 MCG tablet Take 1 tablet (1,000 mcg total) by mouth daily. Replaces: Vitamin B12 1000 MCG Tbcr   diclofenac Sodium 1 % Gel Commonly known as: VOLTAREN Apply 2 g topically 4 (four) times daily. Apply to left knee. What changed: additional instructions   FLUoxetine 10 MG capsule Commonly known as: PROZAC Take 1 capsule (10 mg total) by mouth daily.   folic acid 1 MG tablet Commonly known as: FOLVITE Take 1 tablet (1 mg total) by mouth daily.   gabapentin 100 MG capsule Commonly known as: NEURONTIN TAKE 1 CAPSULE BY MOUTH AT BEDTIME What changed:  how much to take how to take this when to take this additional instructions   Levemir FlexTouch 100 UNIT/ML FlexPen Generic drug: insulin detemir Inject 10 Units into the skin daily. What changed:  how much to take Another medication with the same name was removed. Continue taking this medication, and follow the directions you see here.   lidocaine 5 % Commonly known as: LIDODERM Place 3 patches onto the skin daily. Remove & Discard patch within 12 hours or as directed by MD   metoprolol tartrate 50 MG tablet Commonly known as: LOPRESSOR TAKE 1 TABLET BY MOUTH TWICE A DAY   OneTouch Delica Lancets 48A Misc See admin instructions.   Procrit 10000 UNIT/ML injection Generic drug: epoetin alfa Every other week   senna-docusate 8.6-50 MG tablet Commonly known as: Senokot-S Take 2 tablets by mouth at bedtime as needed for mild constipation.   simethicone 80 MG chewable tablet Commonly known as: MYLICON Chew 1 tablet (80 mg total) by mouth every 6 (six) hours as needed for flatulence.   sodium bicarbonate 650 MG tablet Take 1 tablet (650 mg total) by mouth 2 (two) times daily.        Follow-up Information     Glendale Chard, MD. Go to.   Specialty: Internal Medicine Why: 06/10/2022 Contact information: [address] [place]  [address] [phone]         Orene Desanctis, MD Follow up.   Specialty: Orthopedic Surgery Why: Call in 1-2 days to make hospital follow-up appointment Contact information: [address] [place] [address] [phone]         Marchia Bond, MD Follow up.   Specialty: Orthopedic Surgery Why: Call in 1-2 days to make hospital follow-up appointment Contact information: [place] [address] [phone]         Courtney Heys, MD Follow up.   Specialty: Physical Medicine and Rehabilitation Why: office will call you to arrange your appt (sent) Contact information: [address] [place] [address] [phone]         Claudia Desanctis, MD Follow up.   Specialty: Nephrology Why: Call office in 1-2 days to make arrangements for Procrit injection and for hospital follow-up appointment. Contact information: [place] [address] [phone]         Pieter Partridge, DO. Go to.   Specialty: Neurology Contact information: [place] [address] [phone]                 Signed: Barbie Banner 06/09/2022, 2:51 PM

## 2022-05-19 NOTE — Progress Notes (Addendum)
Inpatient Rehab Admissions Coordinator:   Addendum: Patient has been cleared by medical team to move to CIR today. Will arrange for transport.    On appeal patient's insurance approved CIR. Spoke with patient's daughter [name] this am. If bed available, and patient medically ready will plan to admit to [place], PT, [facility] [phone]

## 2022-05-19 NOTE — Progress Notes (Signed)
Inpatient Rehabilitation Admission Medication Review by a Pharmacist  A complete drug regimen review was completed for this patient to identify any potential clinically significant medication issues.  High Risk Drug Classes Is patient taking? Indication by Medication  Antipsychotic Yes Compazine- N/V  Anticoagulant Yes Lovenox- VTE ppx  Antibiotic Yes, IV and oral route Azithromycin / CTX- CAP EOT: 05/20/2022  Opioid Yes Tramadol- acute pain  Antiplatelet No   Hypoglycemics/insulin Yes Insulin (long acting / short acting)- T2DM  Vasoactive Medication Yes Norvasc, Lopressor- HTN  Chemotherapy No   Other Yes Gabapentin - peripheral neuropathy Sodium Bicarb - Alkalinizing agent Robaxin- muscle relaxant Trazodone- insomnia Lipitor- HLD Claritin- allergies Rocaltrol- hypoparathyroidism     Type of Medication Issue Identified Description of Issue Recommendation(s)  Drug Interaction(s) (clinically significant)     Duplicate Therapy     Allergy     No Medication Administration End Date     Incorrect Dose     Additional Drug Therapy Needed     Significant med changes from prior encounter (inform family/care partners about these prior to discharge).    Other       Clinically significant medication issues were identified that warrant physician communication and completion of prescribed/recommended actions by midnight of the next day:  No   Time spent performing this drug regimen review (minutes):  30   Earle Reome BS, PharmD, BCPS Clinical Pharmacist 05/19/2022 12:37 PM  Contact: [phone] after 3 PM  "Be curious, not judgmental..." -Jamal Maes  Updated By: Horton Chin, Pharm.D., BCPS Clinical Pharmacist  05/19/2022 3:18 PM

## 2022-05-19 NOTE — Discharge Summary (Signed)
Physician Discharge Summary  Miranda Roman JSH:[id number] DOB: 12/04/1945 DOA: 05/08/2022  PCP: Glendale Chard, MD  Admit date: 05/08/2022 Discharge date: 05/19/2022  Time spent: 60 minutes  Recommendations for Outpatient Follow-up:  Follow-up with MD at inpatient rehab. Follow-up with Dr. Mardelle Matte, orthopedics 2 weeks postdischarge.   Discharge Diagnoses:  Principal Problem:   Tibia/fibula fracture, right, closed, initial encounter Active Problems:   PNA (pneumonia)   Type II diabetes mellitus   Benign essential hypertension   Leukocytosis   Right radial fracture   Right distal ulnar fracture   Left wrist pain   Acute renal failure superimposed on stage 4 chronic kidney disease (HCC)   Hypertension   Postoperative anemia due to acute blood loss   Discharge Condition: Stable and improved.  Diet recommendation: Carb modified diet  Filed Weights   05/08/22 2300 05/18/22 0500 05/19/22 0500  Weight: 78.2 kg 78.3 kg 78.9 kg    History of present illness:  HPI per Dr. Isidor Holts Miranda Roman is a 76 y.o. female with medical history significant of mild neurocognitive disorder, HTN, pulmonary HTN, bronchiectasis, T2DM, CKD4 who presents after a fall.    Pt just moved in to live with daughter and was tripped by 2 dogs today and fell. Did not hit her head or lose consciousness. She normally ambulates with walker but was not doing so at the time. Not on aspirin or anticoagulation. Not complaining of much pain other than left wrist pain on my evaluation.   In the ED, afebrile, normotensive on room air.    WBC 12.4. Hgb of 10.4. BMP pending.    Right Tib/Fib X-ray showed acute mildly to moderately comminuted and displaced fracture of the proximal tibial metadiaphysis.  Acute mildly displaced and mild impacted fx of the proximal fibular metaphysis.  Ortho was consulted and will tried to operated in the next 2 days due to scheduling.   Right wrist with acute fracture of the distal  radial metaphysis with mild volar apex angulation. Acute nondisplaced fx of the distal ulnar metaphysis.  Hand surgery Dr. Greta Doom consulted. Fractures are non-operable and place on splint.   Hospital Course:  1 right tib-fib fracture -Secondary to mechanical fall.  No syncopal episodes. -Plain films of her right tib-fib showed acute mildly to moderately commuted and displaced fracture of the proximal tibial metadiaphysis.  Acutely mildly displaced and mildly impacted fracture of the proximal fibular metaphysis. -Patient seen in consultation by orthopedics and patient underwent right proximal tibia fracture open plating with IM nail fixation, closed management right distal radius and ulna fracture per orthopedics, Dr. Mardelle Matte 05/10/2022. -Pain management and DVT prophylaxis per orthopedics. -Patient seen by PT/OT recommending CIR. -CIR consulted and initial denial by insurance.   -Appeal process underway, per daughter denial reversed. -Patient noted prior to hospitalization to have been ambulating with a walker per daughter and caretaker. -Patient seen by PT/OT. -Patient will be discharged to inpatient rehab -Outpatient follow-up with orthopedics.   2.  Left wrist pain -Plain films of the left wrist negative for any fracture.   3.  CAP  -The morning of 05/16/2022, was notified by RN that patient with a temp of 103.1, noted to be tachycardic, increased respiratory rate and desats.   -Patient noted to have a significant leukocytosis with white count of 23.2 which is trending back down to 16.4 by day of discharge. - Blood cultures also ordered and pending. -Patient noted overnight(05/16/2022) to have new abdominal pain, assessed by NP on-call CT abdomen  and pelvis were done which are concerning for patchy infiltrates in the bases.  -Urine strep pneumococcus antigen negative.  -Urine Legionella antigen pending. -Patient remained afebrile, improved clinically with resolution of hypoxia with sats  of 98% on room air by day of discharge.   -Patient maintained on empiric IV Rocephin and IV azithromycin, and could likely be transition to oral antibiotics in the next 1 to 2 days if continued improvement with leukocytosis. -Urinalysis done on 05/15/2022 with trace leukocytes, nitrite negative, 6-10 WBCs.  Urine cultures negative. -SLP evaluated. -Patient will be discharged to inpatient rehab.   4.  Right radial fracture -Right wrist with acute fracture of the distal radial metaphysis with mild volar apex angulation.  Acute nondisplaced fracture of the distal ulnar metaphysis. -Hand surgery, Dr. Greta Doom consulted and fracture is nonoperable and patient placed in a splint. -Outpatient follow-up with orthopedics/hand surgery.   5.  Hypertension -Norvasc discontinued per nephrology, Lopressor dose decreased during the hospitalization to allow for better perfusion was patient noted to have had a bout of hypotension. -Patient seen by nephrology who recommended resumption of Norvasc 2.5 mg daily on day of discharge. -Patient will be discharged to inpatient rehab.     6.  Diabetes mellitus type 2 -Hemoglobin A1c 5.5 (01/29/2022) -Patient maintained on Levemir 10 units daily as well as SSI.  -Patient will be discharged to inpatient rehab.     7.  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 -Baseline creatinine approximately 2.3-2.6. -Creatinine was trending back up to 3.57 from 3.09.  -CT abdomen and pelvis done negative for hydronephrosis.  -Patient noted to be hypotensive 67/67/2094, with systolics in the high 70J to 90s which improved on fluids and transfusion of PRBCs. -Patient with urine output remained stable during the hospitalization and patient with a urine output of 1.725 L over the past 24 hours prior to discharge..  -Urinalysis was nitrite negative leukocytes -0-5 WBCs. -Urine sodium of 72, urine creatinine of 41. -Patient  was maintained on home regimen calcitriol. -Discontinue IV morphine and placed on IV Dilaudid as needed. -Patient seen in consultation by nephrology who feel worsening renal function likely secondary to new infection with capillary leak and relative hypotension/hypoperfusion. -IV fluids increased to 75 cc an hour, Norvasc discontinued and metoprolol dose decreased per renal recommendations. -Renal function improved and trending back down such that by day of discharge creatinine was down to 3.04.   -Patient noted to have a urine output of 1.725 L 24 hours prior to discharge.  -Patient seen by nephrology who recommended resumption of Norvasc at 2.5 mg daily, continued gentle hydration with repeat labs in the a.m.   -Nephrology following and will likely follow for another 24 hours at inpatient rehab.   -Will need outpatient follow-up with primary nephrologist postdischarge.    8.  Postop acute blood loss anemia -Patient noted to have hemoglobin drop as low as 7.1 on 05/16/2022. -Patient noted to be tachycardic, hypotensive transiently however denied any overt bleeding. -Status posttransfusion 2 units packed red blood cells hemoglobin stable at 9.0 by day of discharge. -A.m. labs pending at time of discharge.  -Patient will be discharged to inpatient rehab in stable and improved condition.  -Follow-up with MD at inpatient rehab.     Procedures: Plain films of the right tib-fib 05/10/2022, 05/08/2022 Plain films of the pelvis 05/09/2022 Plain films of the right femur 05/09/2022 Plain films of bilateral wrists 05/08/2022 Right proximal tibia fracture  open plate and with IM nail fixation, closed management right distal radius and ulna fracture per Dr. Mardelle Matte orthopedics 05/10/2021 CT abdomen and pelvis 05/16/2022 Transfusion 2 units packed red blood cells 05/16/2022  Consultations: Orthopedics: Dr.Marchwiany: 05/09/2022 Nephrology: Dr.Schertz 05/17/2022  Discharge Exam: Vitals:   05/19/22 0529  05/19/22 0850  BP: (!) 149/78 (!) 151/83  Pulse: (!) 103 (!) 102  Resp: 20   Temp: 99.8 F (37.7 C)   SpO2: 98%     General: NAD Cardiovascular: RRR no murmurs rubs or gallops.  No JVD.  No lower extremity edema. Respiratory: Decreased coarse breath sounds in the bases.  No wheezing.  Fair air movement.  Speaking in full sentences.  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   amLODipine 2.5 MG tablet Commonly known as: NORVASC Take 2.5 mg by mouth daily.   atorvastatin 20 MG tablet Commonly known as: LIPITOR TAKE ONE TABLET BY MOUTH MONDAY THRU FRIDAY AND SKIP WEEKENDS   AZO-CRANBERRY PO Take 1 capsule by mouth daily at 6 (six) AM.   b complex vitamins capsule Take 1 capsule by mouth daily.   BD Insulin Syringe U/F 31G X 5/16" 0.3 ML Misc Generic drug: Insulin Syringe-Needle U-100 See admin instructions. use with insulin   BD Pen Needle Nano U/F 32G X 4 MM Misc Generic drug: Insulin Pen Needle Use as directed with insulin pen   calcitRIOL 0.25 MCG capsule Commonly known as: ROCALTROL Take 0.25 mcg by mouth daily.   CENTRUM SILVER PO Take by mouth daily.   cetirizine 10 MG tablet Commonly known as: ZYRTEC Take 10 mg by mouth daily.   diclofenac Sodium 1 % Gel Commonly known as: VOLTAREN Apply 2 g topically 4 (four) times daily. Apply to left knee.   gabapentin 100 MG  capsule Commonly known as: NEURONTIN TAKE 1 CAPSULE BY MOUTH AT BEDTIME   HumaLOG KwikPen 100 UNIT/ML KwikPen Generic drug: insulin lispro INJECT SUBCUTANEOUSLY 4 UNITS 3  TIMES DAILY WITH MEALS   Levemir 100 UNIT/ML injection Generic drug: insulin detemir INJECT 0.12 MLS (12 UNITS TOTAL) INTO THE SKIN 2 (TWO) TIMES DAILY.   Levemir FlexTouch 100 UNIT/ML FlexPen Generic drug: insulin detemir Inject 6 Units into the skin daily.   metoprolol tartrate 50 MG tablet Commonly known as: LOPRESSOR TAKE 1 TABLET BY MOUTH TWICE A DAY   OneTouch Delica Lancets 32G Misc See admin instructions.   Procrit 10000 UNIT/ML injection Generic drug: epoetin alfa Every other week   sodium bicarbonate 650 MG tablet Take 650 mg by mouth 2 (two) times daily.   Vitamin B12 1000 MCG Tbcr Take 1 tablet by mouth daily.       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 Schedule an appointment as soon as possible for a visit in 2 week(s).   Specialty: Orthopedic Surgery Contact information: [address] [address] [phone]                  The results of significant diagnostics from this hospitalization (including imaging, microbiology, ancillary and laboratory) are listed below for reference.    Significant Diagnostic Studies: CT ABDOMEN PELVIS WO CONTRAST  Result Date: 05/16/2022 CLINICAL DATA:  Abdominal pain and spasms, elevated white blood count. EXAM: CT ABDOMEN AND PELVIS WITHOUT CONTRAST TECHNIQUE: Multidetector CT imaging of the abdomen and pelvis was performed following the standard protocol without IV contrast. RADIATION DOSE REDUCTION: This exam was performed according to the departmental dose-optimization program which includes automated exposure  control, adjustment of the mA and/or kV according to patient size and/or use of iterative reconstruction technique. COMPARISON:  04/09/2020, ninth 09/24/2018. FINDINGS: Lower chest: The heart is normal in size and there is no pericardial effusion. Scattered coronary artery calcifications are noted patchy infiltrates are noted at the lung bases bilaterally. Hepatobiliary: No focal liver abnormality is seen. No biliary ductal dilatation. Stones are present within the gallbladder. Pancreas: A 9 mm hypodensity is noted in the tail of the pancreas, possible cyst. No pancreatic ductal dilatation or surrounding inflammatory changes. Spleen: Normal in size without focal abnormality. Adrenals/Urinary Tract: The adrenal glands are within normal limits. There is a cyst in the mid right kidney no renal calculus or hydronephrosis. The bladder is unremarkable. 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 Appendix appears normal. No evidence of bowel wall thickening, distention, or inflammatory changes. No free air or pneumatosis. Scattered diverticula are present along the colon without evidence of diverticulitis. Vascular/Lymphatic: Aortic atherosclerosis. No enlarged abdominal or pelvic lymph nodes. Reproductive: Uterus and bilateral adnexa are unremarkable. Other: No abdominopelvic ascites. Scattered foci of air are noted in the anterior abdominal wall which may be iatrogenic. 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 IMPRESSION: 1. Patchy infiltrates at the lung bases, concerning for pneumonia. 2. Cholelithiasis. 3. 9 mm cyst in the tail of the pancreas and essentially unchanged from 2020. 4. Diverticulosis without diverticulitis. 5. Aortic atherosclerosis and coronary artery  calcifications. Electronically Signed   By: Brett Fairy M.D.   On: 05/16/2022 02:26   DG CHEST PORT 1 VIEW  Result Date: 05/15/2022 CLINICAL DATA:  Leukocytosis EXAM: PORTABLE CHEST 1 VIEW COMPARISON:  Chest radiograph dated 04/25/2020 FINDINGS: Lines/tubes: None. Chest: Low lung volumes.  Patchy bibasilar opacities. Pleura: Blunting of the bilateral costophrenic angles. No pneumothorax. Heart/mediastinum: The heart size and mediastinal contours are within normal limits. Bones: No acute osseous abnormality. Curvilinear radiodensity in the left upper quadrant may reflect costochondral calcifications. IMPRESSION: 1. Low lung volumes with patchy bibasilar opacities which may represent atelectasis, aspiration, or pneumonia. 2. Small bilateral pleural effusions. Electronically Signed   By: Darrin Nipper M.D.   On: 05/15/2022 12:20   DG Tibia/Fibula Right Port  Result Date: 05/10/2022 CLINICAL DATA:  Status post ORIF. EXAM: PORTABLE RIGHT TIBIA AND FIBULA - 2 VIEW COMPARISON:  Preoperative imaging. FINDINGS: Intramedullary nail with distal and proximal locking screws traverse proximal tibial fracture. There is also medial plate and screw fixation. Fracture is in improved alignment from preoperative imaging. Minimally displaced proximal fibular fracture also in improved alignment. Overlying cast material limits. Osseous and soft tissue detail. IMPRESSION: 1. ORIF of proximal tibial fracture, in improved alignment from preoperative imaging. 2. Improved alignment of proximal fibular fracture. Electronically Signed   By: Keith Rake M.D.   On: 05/10/2022 14:38   DG Tibia/Fibula Right  Result Date: 05/10/2022 CLINICAL DATA:  Elective surgery. EXAM: RIGHT TIBIA AND FIBULA - 2 VIEW COMPARISON:  Preoperative radiograph. FINDINGS: Eight fluoroscopic spot views of the right tibia and fibula obtained in the operating room. Interval intramedullary nail with distal and proximal locking screw fixation of tibial fracture.  Additionally there is a medial plate and screw fixation. Proximal fibular fracture is in improved alignment from preoperative imaging. Fluoroscopy time 1 minutes 14 seconds. Dose 3.14 mGy. IMPRESSION: Intraoperative fluoroscopy during ORIF of tibial fracture. Electronically Signed   By: Keith Rake M.D.   On: 05/10/2022 12:21   DG C-Arm 1-60 Min-No Report  Result Date: 05/10/2022 Fluoroscopy was utilized by the requesting physician.  No radiographic interpretation.   DG C-Arm 1-60 Min-No Report  Result Date: 05/10/2022 Fluoroscopy was utilized by the requesting physician.  No radiographic interpretation.   DG FEMUR PORT, MIN 2 VIEWS RIGHT  Result Date: 05/09/2022 CLINICAL DATA:  Fall yesterday. EXAM: RIGHT FEMUR PORTABLE 2 VIEW COMPARISON:  None Available. FINDINGS: No evidence of acute femur fracture. Knee alignment is maintained. Proximal tibia fracture was assessed on tibia radiographs yesterday. Changes of avascular necrosis of the femoral head were appreciated on reformats from 04/09/2020 abdominopelvic CT. IMPRESSION: 1. No acute femur fracture. Proximal tibia fracture assessed on tibia radiographs yesterday. 2. Avascular necrosis of the femoral head, which was better appreciated on prior CT. Electronically Signed   By: Keith Rake M.D.   On: 05/09/2022 18:58   DG Pelvis Portable  Result Date: 05/09/2022 CLINICAL DATA:  Fall yesterday. EXAM: PORTABLE PELVIS 1-2 VIEWS COMPARISON:  CT reformats from abdominopelvic CT 04/09/2020 FINDINGS: There is irregularity of the right inferior pubic ramus. It is unclear if this is related to patient rotation or nondisplaced ramus fracture. No other fracture of the pelvis. 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 Mild right hip joint space narrowing. IMPRESSION: 1. Irregularity of the right inferior pubic ramus. This is unclear if this is related to patient rotation or nondisplaced ramus fracture. 2. Changes  of avascular necrosis of both femoral heads was better appreciated on CT. Electronically Signed   By: Keith Rake M.D.   On: 05/09/2022 18:57   DG Wrist Complete Left  Result Date: 05/08/2022 CLINICAL DATA:  Fall and left wrist pain. EXAM: LEFT WRIST - COMPLETE 3+ VIEW COMPARISON:  None Available. FINDINGS: No acute fracture or dislocation. Distal radial sideplate fixation and screws. The hardware is intact. The bones are osteopenic. There is mild soft tissue swelling of the wrist. Vascular calcifications noted. IMPRESSION: 1. No acute fracture or dislocation. 2. Mild soft tissue swelling. Electronically Signed   By: Anner Crete M.D.   On: 05/08/2022 20:39   DG Wrist Complete Right  Result Date: 05/08/2022 CLINICAL DATA:  Fall.  Pain. EXAM: RIGHT WRIST - COMPLETE 3+ VIEW COMPARISON:  Right hand radiographs 09/14/2020 FINDINGS: There is diffuse decreased bone mineralization. There is a new acute fracture of the distal radial  metaphysis with mild volar apex angulation. Mild 3 mm lateral cortical step-off of the distal lateral fracture component on frontal view but otherwise no significant displacement. Mild lucency and minimal angulation of the lateral aspect of the distal ulnar metaphysis, likely an acute nondisplaced fracture. Mild-to-moderate triscaphe and mild thumb carpometacarpal joint space narrowing and peripheral osteophytosis. Moderate to high-grade wrist soft tissue swelling. IMPRESSION: 1. Acute fracture of the distal radial metaphysis with mild volar apex angulation. 2. Acute nondisplaced fracture of the distal ulnar metaphysis. Electronically Signed   By: Yvonne Kendall M.D.   On: 05/08/2022 16:00   DG Tibia/Fibula Right  Result Date: 05/08/2022 CLINICAL DATA:  Fall.  Pain. EXAM: RIGHT TIBIA AND FIBULA - 2 VIEW COMPARISON:  None Available. FINDINGS: There is diffuse decreased bone mineralization. There is a mildly to moderately comminuted acute fracture of the proximal tibial  metadiaphysis with mild medial and anterior apex angulation and approximately 9 mm lateral displacement of the distal fracture component with respect to the proximal fracture component. Acute fracture of the proximal fibular metaphysis with up to approximately 6 mm medial and 6 mm anterior displacement of the distal fracture component with respect to the proximal fracture component. Mild impaction. Small to moderate knee joint effusion. Mild-to-moderate plantar calcaneal heel spur. IMPRESSION: 1. Acute mildly to moderately comminuted and displaced fracture of the proximal tibial metadiaphysis. 2. Acute mildly displaced and mildly impacted fracture of the proximal fibular metaphysis. Electronically Signed   By: Yvonne Kendall M.D.   On: 05/08/2022 15:58    Microbiology: Recent Results (from the past 240 hour(s))  Surgical pcr screen     Status: None   Collection Time: 05/10/22  8:53 AM   Specimen: Nasal Mucosa; Nasal Swab  Result Value Ref Range Status   MRSA, PCR NEGATIVE NEGATIVE Final   Staphylococcus aureus NEGATIVE NEGATIVE Final    Comment: (NOTE) The Xpert SA Assay (FDA approved for NASAL specimens in patients 74 years of age and older), is one component of a comprehensive surveillance program. It is not intended to diagnose infection nor to guide or monitor treatment. Performed at [facility], [place] [address] [place], [place] [address]   Urine Culture     Status: None   Collection Time: 05/15/22 11:12 AM   Specimen: Urine, Catheterized  Result Value Ref Range Status   Specimen Description   Final    URINE, CATHETERIZED Performed at [facility] [address], [place], [place] [address]    Special Requests   Final    NONE Performed at [facility], [place] [address], [place], [place] [address]    Culture   Final    NO GROWTH Performed at [facility], [place] [address], [place], [place] [address]    Report Status  05/16/2022 FINAL  Final  Culture, blood (Routine X 2) w Reflex to ID Panel     Status: None (Preliminary result)   Collection Time: 05/16/22  9:20 AM   Specimen: BLOOD LEFT HAND  Result Value Ref Range Status   Specimen Description   Final    BLOOD LEFT HAND Performed at [facility] [address] [place], [place] [address]    Special Requests   Final    BOTTLES DRAWN AEROBIC AND ANAEROBIC Blood Culture adequate volume Performed at [facility] [address], [place], [place] [address]    Culture   Final    NO GROWTH 3 DAYS Performed at  [facility], [place] [address], [place], [place] [address]    Report Status PENDING  Incomplete  Culture, blood (Routine X 2) w Reflex to ID Panel     Status: None (Preliminary result)   Collection Time: 05/16/22  9:33 AM   Specimen: BLOOD RIGHT ARM  Result Value Ref Range Status   Specimen Description   Final    BLOOD RIGHT ARM Performed at [facility] [address], [place], [place] [address]    Special Requests   Final    BOTTLES DRAWN AEROBIC AND ANAEROBIC Blood Culture adequate volume Performed at [facility] [address], [place], [place] [address]    Culture   Final    NO GROWTH 3 DAYS Performed at [facility], [place] [address] [place], [place] [address]    Report Status PENDING  Incomplete     Labs: Basic Metabolic Panel: Recent Labs  Lab 05/15/22 1035 05/16/22 0304 05/17/22 0522 05/18/22 0435 05/19/22 0427  NA 141 141 144 143 146*  K 4.4 5.1 4.4 4.2 4.2  CL 112* 111 116* 114* 118*  CO2 21* 19* 21* 20* 21*  GLUCOSE 239* 206* 149* 120* 82  BUN 53* 56* 52* 56* 48*  CREATININE 2.86* 3.09* 3.57* 3.18* 3.04*  CALCIUM 8.4* 8.2* 8.0* 8.0* 8.0*  MG 2.1  --   --  2.0  --   PHOS  --  4.4 5.4* 3.8  --    Liver Function Tests: Recent Labs  Lab 05/13/22 0342 05/14/22 0343 05/16/22 0304 05/17/22 0522 05/18/22 0435  AST 18 18  --    --   --   ALT <5 6  --   --   --   ALKPHOS 55 53  --   --   --   BILITOT 0.6 0.5  --   --   --   PROT 5.7* 5.6*  --   --   --   ALBUMIN 2.7* 2.7* 2.7* 2.2* 2.1*   No results for input(s): "LIPASE", "AMYLASE" in the last 168 hours. No results for input(s): "AMMONIA" in the last 168 hours. CBC: Recent Labs  Lab 05/15/22 1035 05/16/22 0920 05/16/22 1551 05/17/22 0522 05/18/22 0435 05/19/22 1029  WBC 14.7* 23.2*  --  20.0* 18.7* 16.4*  NEUTROABS  --  17.9*  --  15.5* 15.1* 13.6*  HGB 9.0* 7.1* 8.0* 9.9* 10.0* 9.0*  HCT 27.9* 21.9* 25.0* 29.8* 31.2* 27.7*  MCV 74.2* 74.5*  --  78.6* 80.6 78.9*  PLT 177 166  --  164 210 217   Cardiac Enzymes: No results for input(s): "CKTOTAL", "CKMB", "CKMBINDEX", "TROPONINI" in the last 168 hours. BNP: BNP (last 3 results) No results for input(s): "BNP" in the last 8760 hours.  ProBNP (last 3 results) No results for input(s): "PROBNP" in the last 8760 hours.  CBG: Recent Labs  Lab 05/18/22 0758 05/18/22 1310 05/18/22 1725 05/18/22 2042 05/19/22 0747  GLUCAP 127* 124* 110* 132* 86       Signed:  Irine Seal MD.  [facility] 05/19/2022, 11:25 AM

## 2022-05-19 NOTE — Plan of Care (Signed)
  Problem: Education: Goal: Knowledge of General Education information will improve Description: Including pain rating scale, medication(s)/side effects and non-pharmacologic comfort measures Outcome: Progressing   Problem: Health Behavior/Discharge Planning: Goal: Ability to manage health-related needs will improve Outcome: Progressing

## 2022-05-19 NOTE — Progress Notes (Signed)
[facility] Progress Note  Subjective: pt seen in room. UOP 1900 yest and 925 today so far.   Vitals:   05/18/22 2307 05/19/22 0500 05/19/22 0529 05/19/22 0850  BP: (!) 160/76  (!) 149/78 (!) 151/83  Pulse: (!) 104  (!) 103 (!) 102  Resp:   20   Temp:   99.8 F (37.7 C)   TempSrc:   Oral   SpO2:   98%   Weight:  78.9 kg    Height:        Exam: Gen alert, elderly AAF no distress No jvd or bruits Chest clear bilat to bases RRR no MRG Abd soft ntnd no mass or ascites +bs Ext no LE or UE edema Neuro is alert, Ox 3 , nf      Home meds include - amlodipine 2.5 qd, atorvastatin, gabapentin, humalog/ levemir insulin, metoprolol 50 bid, MVI, sod bicarb 1 bid, prns/ vits/ supps     Date                           Creat               eGFR   2019- 2020                2.44- 2.84   March 2021               2.36   Jul- aug 2021            1.44-  2.05 May 2020                   1.91- 2.30   Jun- nov 2022           2.30                    Feb 2023                   2.59                 19 ml/min     July 2023                  2.43                 20 ml/min   Sept 2023                  2.60    11/02                                    2.60                 19 ml/min    11/04                                    3.07     11/06                       2.36     11/08                       2.87  11/10                       3.09                 15 ml/min     11/11                       3.57                 13 ml/min      UA 11/9 - 100 prot, 0-5 rbc, 6-10 wbc, rare bact    CT abd w/o contrast 11/10 - Adrenals/Urinary Tract: The adrenal glands are within normal limits. There is a cyst in the mid right kidney no renal calculus or hydronephrosis. The bladder is unremarkable   Assessment/ Plan: AKI on CKD 4 - B/l creatinine 2.43- 2.60, from feb- sept  2023, eGFR 19- 20 ml/min. Pt is f/b Dr Royce Macadamia at [facility]. Creat here was 2.6 on admission in setting of fall and tib/  fib fracture. Creat was stable for several days until last 48 hrs when pt developed high fevers w/ suspected infection/ PNA. BP dropped slightly lower. Creat increased up to 3.1 and then 3.5. CT showed no obstruction and UA was unremarkable. No nephrotoxic exposure. AKI suspected due to new infection w/ cap leak and relative hypotension/ hypoperfusion. With IVF's, lowering of BP meds and IV abx her creat peaked at 3.5 and was down to 3.2 yest and 3.0 today. Will resume low dose norvasc at 2.5 qd, goal SBP short-term 120- 150 for now. F/u labs in am.  HTN - norvasc dc'd and metoprolol lowered to 25 bid. Will resume norvasc at 2.5 qd today.  Fall w/ R tib/ fib fracture - s/p ORIF 11/4 CAP - w/ low sats, temp 103, getting IV rocephin/ zithromax. Fevers improving.  DM2 - per pmd.  Dispo - moving over to CIR today      Rob Cree Napoli 05/19/2022, 10:18 AM   Recent Labs  Lab 05/17/22 0522 05/18/22 0435 05/19/22 0427  HGB 9.9* 10.0*  --   ALBUMIN 2.2* 2.1*  --   CALCIUM 8.0* 8.0* 8.0*  PHOS 5.4* 3.8  --   CREATININE 3.57* 3.18* 3.04*  K 4.4 4.2 4.2    No results for input(s): "IRON", "TIBC", "FERRITIN" in the last 168 hours. Inpatient medications:  atorvastatin  20 mg Oral Daily   azithromycin  500 mg Oral Daily   calcitRIOL  0.25 mcg Oral Daily   cyanocobalamin  1,000 mcg Oral Daily   docusate sodium  100 mg Oral BID   enoxaparin (LOVENOX) injection  30 mg Subcutaneous Q24H   gabapentin  100 mg Oral QHS   insulin aspart  0-15 Units Subcutaneous TID WC   insulin aspart  0-5 Units Subcutaneous QHS   insulin detemir  10 Units Subcutaneous Daily   loratadine  10 mg Oral QHS   metoprolol tartrate  25 mg Oral BID   senna  1 tablet Oral BID   sodium bicarbonate  650 mg Oral BID   sodium phosphate  1 enema Rectal Once    sodium chloride Stopped (05/10/22 1825)   sodium chloride 65 mL/hr at 05/19/22 0609   cefTRIAXone (ROCEPHIN)  IV 2 g (05/19/22 0849)   methocarbamol (ROBAXIN) IV      sodium chloride, acetaminophen, bisacodyl, diphenhydrAMINE, HYDROmorphone (DILAUDID) injection, magnesium citrate, methocarbamol **OR** methocarbamol (ROBAXIN) IV, metoCLOPramide **OR** metoCLOPramide (REGLAN) injection, ondansetron **OR** ondansetron (  ZOFRAN) IV, mouth rinse, polyethylene glycol, traMADol

## 2022-05-19 NOTE — H&P (Signed)
Physical Medicine and Rehabilitation Admission H&P    CC:  Debility secondary to fall resulting in right tib-fib fracture and right wrist fracture  HPI: Miranda Roman. Gills is a right-handed 76 year old female brought to the emergency department via EMS from home for mechanical fall on 05/08/2022.[name]  She had physical deformity to the right lower extremity and right wrist.  She had recently moved in with her daughter and typically ambulates with a rolling walker.  She tripped over the family dogs and was complaining of right wrist and right proximal lower leg pain.  Medical history is significant for diabetes mellitus, hypertension and chronic kidney disease stage IV.  Initial imaging revealed no spinal deformity.  Positive for nondisplaced right distal ulnar/radius fracture.  Dr. Greta Doom consulted and fracture is nonoperable and patient placed in splint.  She was admitted and orthopedic surgery consultation obtained.  Imaging revealed right tib-fib fracture and she was placed into a long-leg splint.  On 11/4, she was taken to the operating room by Dr. Mardelle Matte and underwent ORIF with IM nail fixation of right tib-fib fracture and closed management of right distal radius and ulnar fracture.  She developed acute kidney injury with serum creatinine elevated to 3.5.  IVFs continued. Nephrology was consulted on 11/11.  She developed fever with worsening leukocytosis on 11/9.  Urine culture and chest x-ray ordered.  She complained of abdominal pain 11/10 and CT of abdomen pelvis performed. No acute abnormality. Fever worsened to 103.1 on 11/10 and she was tachycardic with increased respiratory rate and desaturating.  Blood cultures obtained.  Empirically started on IV Rocephin and IV azithromycin.  She is nonweightbearing in the right lower extremity and weightbearing as tolerated to the elbow of the right upper extremity.The patient requires inpatient physical medicine and rehabilitation evaluations and treatment  secondary to dysfunction due to fall with RLE and RUE fractures.  Her daughter Miranda Roman is at bedside. Son is to arrive from [place] in the morning. She states her mother was diagnosed with encephalopathy approximately two years ago and has cognitive impairment.   Review of Systems  Constitutional:  Positive for fever. Negative for chills.  Respiratory:  Positive for cough.   Cardiovascular:  Positive for chest pain.       Says chest pain is due to pneumonia   Gastrointestinal:  Positive for nausea. Negative for vomiting.  Genitourinary:  Negative for dysuria and urgency.       Has Purewick in place  Musculoskeletal:  Positive for joint pain.  Neurological:  Negative for dizziness and headaches.   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   Borderline steroid-induced glaucoma, 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   OD, Early elevated intraocular pressure to 28 mm we will discontinue all topical medications from surgery in the right eye today   Bronchiectasis without complication 62/83/6629   Cataract 07/03/2020   Chest pain at rest 01/30/2015   Chronic kidney disease, stage 4 (severe) 12/07/2020   Closed fracture of right tibial plateau  07/17/2021   Cyst of left kidney 03/10/2019   Needs repeated US 08/2019   Dysphagia, neurologic    Elevated d-dimer 12/20/2019   Facet arthritis of lumbar region 02/27/2019   Gastroesophageal reflux disease 08/26/2016   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  Further history patient suffered possible Wernicke's encephalopathy the past, a type of the vitamin deficiencies, thiamine that can also affect the retina if there is significant damage which may not be recoverable.   Mild neurocognitive disorder due to multiple etiologies 01/23/2022   Osteoporosis 12/15/2018   Overweight with body mass index (BMI) 25.0-29.9 12/15/2018   PAD (peripheral artery disease) 06/06/2019   Pain in right hand 04/27/2020   Pancreatic cyst 06/06/2019   Polyneuropathy due to type 2 diabetes mellitus    Posterior vitreous detachment of left eye 08/05/2021   Proliferative diabetic retinopathy of right eye 08/22/2021   Likely contributory cause of nonclearing vitreous hemorrhage in the right eye now status post PRP likely to resolve in 2 quiescent PDR, post vitrectomy PRP   Pulmonary hypertension 02/04/2021   Right knee pain 07/10/2021   Sickle-cell trait 12/07/2020   Type II diabetes mellitus 03/10/2012   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   Vitreous hemorrhage of right eye 08/05/2021   Vitrectomy PRP 08-14-2021   Past Surgical History:  Procedure Laterality Date   BONE RESECTION  01/2006   wrist   GASTRIC BYPASS  08/14/2009   OPEN REDUCTION INTERNAL FIXATION (ORIF) TIBIA/FIBULA FRACTURE Right 05/10/2022   Procedure: OPEN REDUCTION INTERNAL FIXATION (ORIF) TIBIA/FIBULA FRACTURE;  Surgeon: Marchia Bond, MD;  Location: [facility];  Service:  Orthopedics;  Laterality: Right;   OTHER SURGICAL HISTORY     sickle cell retinopathy laser surgery   TUBAL LIGATION  04/28/1977   Family History  Problem Relation Age of Onset   Diabetes Mother    Heart disease Mother    Hypertension Mother    Stroke Mother    Diabetes Father    Heart disease Father    Diabetes Sister    Diabetes Brother    Hypertension Brother    Sickle cell anemia Brother    Diabetes Maternal Aunt    Diabetes Maternal Uncle    Diabetes Maternal Grandmother    Diabetes Maternal Grandfather    Sickle cell anemia Sister    Sickle cell anemia Sister    Healthy Son    Healthy Son    Healthy Daughter    Social History:  reports that she quit smoking about 17 years ago. Her smoking use included cigarettes. She started smoking about 60 years ago. She has a 10.75 pack-year smoking history. She has never used smokeless tobacco. She reports that she does not currently use alcohol. She reports that she does not use drugs. Allergies:  Allergies  Allergen Reactions   Cefepime Other (See Comments)    Causes encephalitis - reported by [facility] 12/31/2019   Lactose Intolerance (Gi)     Per pt's daughter causes gas   Medications Prior  to Admission  Medication Sig Dispense Refill   acetaminophen (TYLENOL) 500 MG tablet Take 1 tablet (500 mg total) by mouth every 6 (six) hours as needed for moderate pain or fever. 30 tablet 0   amLODipine (NORVASC) 2.5 MG tablet Take 2.5 mg by mouth daily.     atorvastatin (LIPITOR) 20 MG tablet TAKE ONE TABLET BY MOUTH MONDAY THRU FRIDAY AND SKIP WEEKENDS (Patient taking differently: Take 20 mg by mouth daily.) 65 tablet 2   AZO-CRANBERRY PO Take 1 capsule by mouth daily at 6 (six) AM.     b complex vitamins capsule Take 1 capsule by mouth daily.     calcitRIOL (ROCALTROL) 0.25 MCG capsule Take 0.25 mcg by mouth daily.     cetirizine (ZYRTEC) 10 MG tablet Take 10 mg by mouth daily.     Cyanocobalamin (VITAMIN B12) 1000 MCG TBCR Take  1 tablet by mouth daily.     diclofenac Sodium (VOLTAREN) 1 % GEL Apply 2 g topically 4 (four) times daily. Apply to left knee. 50 g 0   gabapentin (NEURONTIN) 100 MG capsule TAKE 1 CAPSULE BY MOUTH AT BEDTIME (Patient taking differently: Take 100 mg by mouth at bedtime.) 90 capsule 2   HUMALOG KWIKPEN 100 UNIT/ML KwikPen INJECT SUBCUTANEOUSLY 4 UNITS 3  TIMES DAILY WITH MEALS (Patient taking differently: Inject 4 Units into the skin daily.) 15 mL 2   insulin detemir (LEVEMIR FLEXTOUCH) 100 UNIT/ML FlexPen Inject 6 Units into the skin daily. 15 mL 3   LEVEMIR 100 UNIT/ML injection INJECT 0.12 MLS (12 UNITS TOTAL) INTO THE SKIN 2 (TWO) TIMES DAILY. (Patient taking differently: Inject 6 Units into the skin daily.) 10 mL 1   metoprolol tartrate (LOPRESSOR) 50 MG tablet TAKE 1 TABLET BY MOUTH TWICE A DAY (Patient taking differently: Take 50 mg by mouth 2 (two) times daily.) 180 tablet 1   Multiple Vitamins-Minerals (CENTRUM SILVER PO) Take by mouth daily.     sodium bicarbonate 650 MG tablet Take 650 mg by mouth 2 (two) times daily.     BD INSULIN SYRINGE U/F 31G X 5/16" 0.3 ML MISC See admin instructions. use with insulin     epoetin alfa (PROCRIT) 29798 UNIT/ML injection Every other week (Patient not taking: Reported on 05/08/2022)     Insulin Pen Needle (BD PEN NEEDLE NANO U/F) 32G X 4 MM MISC Use as directed with insulin pen 150 each 2   OneTouch Delica Lancets 92J MISC See admin instructions.        Home: Home Living Family/patient expects to be discharged to:: Unsure Living Arrangements: Children Available Help at Discharge: Family Type of Home: House Home Access: Stairs to enter Technical brewer of Steps: 3 Entrance Stairs-Rails: None Home Layout: Two level, 1/2 bath on main level, Able to live on main level with bedroom/bathroom Alternate Level Stairs-Number of Steps: flight Bathroom Toilet: Handicapped height Bathroom Accessibility: No Additional Comments: shower chair, BSC,  RW, uses tub/shower combo though there is a walk in shower in the home, aide from 10-12 M-F  Lives With: Daughter (and SIL)   Functional History: Prior Function Prior Level of Function : Needs assist Physical Assist : Mobility (physical), ADLs (physical) Mobility (physical): Stairs ADLs (physical): Bathing, Dressing Mobility Comments: uses a walker, could walk in the cul-de-sac ADLs Comments: needed assistance for bathing, LB dressing,  Functional Status:  Mobility: Bed Mobility Overal bed mobility: Needs Assistance Bed Mobility: Rolling Rolling: +2 for physical assistance, +2 for safety/equipment, Max assist, Total assist Supine to  sit: Max assist, +2 for physical assistance, +2 for safety/equipment Sit to supine: Total assist, +2 for physical assistance, +2 for safety/equipment General bed mobility comments: Pt required Max/Total Assist to transition to EOB with increased time.  Attempted to demonstrate how to use belt to self assist LE however pt offers little self effort.  Utilized bed pad to complete scooting was difficu;t. Sitting EOB x 4 min with poor forward flexed collapsed posture. Transfers Overall transfer level: Needs assistance Equipment used: Right platform walker Transfers: Sit to/from Stand, Bed to chair/wheelchair/BSC Sit to Stand: Total assist, +2 physical assistance, +2 safety/equipment, From elevated surface Bed to/from chair/wheelchair/BSC transfer type:: Via Lift equipment Transfer via Lift Equipment: [place] transfer comment: required + 2 Max Assist to partially rise from bed with poor forward flexed posture and inability to complete 1/4 pivot to reclier or self weight shift. Ambulation/Gait General Gait Details: currently Non Amb    ADL: ADL Overall ADL's : Needs assistance/impaired Eating/Feeding: Set up, Bed level Grooming: Set up, Bed level Upper Body Bathing: Moderate assistance, Bed level Lower Body Bathing: Total assistance, Bed  level Upper Body Dressing : Moderate assistance, Bed level Lower Body Dressing: Total assistance, Bed level Toilet Transfer: Total assistance, BSC/3in1, +2 for physical assistance Toileting- Clothing Manipulation and Hygiene: Total assistance Functional mobility during ADLs: Total assistance, +2 for physical assistance  Cognition: Cognition Overall Cognitive Status: Impaired/Different from baseline Arousal/Alertness: Awake/alert Orientation Level: Oriented X4 Attention: Sustained, Focused Focused Attention: Appears intact Sustained Attention: Impaired Sustained Attention Impairment: Verbal complex Memory: Impaired Memory Impairment: Decreased short term memory Decreased Short Term Memory: Verbal basic Awareness: Impaired Awareness Impairment: Emergent impairment Problem Solving: Impaired Problem Solving Impairment: Verbal complex Executive Function: Organizing Organizing: Impaired Organizing Impairment: Verbal complex Cognition Arousal/Alertness: Awake/alert Behavior During Therapy: WFL for tasks assessed/performed Overall Cognitive Status: Impaired/Different from baseline Area of Impairment: Problem solving Problem Solving: Difficulty sequencing, Requires verbal cues, Requires tactile cues, Slow processing General Comments: Alert and oriented but exhibited slow processing.  Physical Exam: Blood pressure (!) 151/83, pulse (!) 102, temperature 99.8 F (37.7 C), temperature source Oral, resp. rate 20, height _0  (1.626 m), weight 78.9 kg, SpO2 98 %. Physical Exam Constitutional:      Appearance: She is obese.  HENT:     Head: Normocephalic and atraumatic.     Right Ear: External ear normal.     Left Ear: External ear normal.     Nose: Nose normal.     Mouth/Throat:     Mouth: Mucous membranes are moist.  Eyes:     General: No scleral icterus.    Extraocular Movements: Extraocular movements intact.     Conjunctiva/sclera: Conjunctivae normal.     Pupils: Pupils are  equal, round, and reactive to light.  Cardiovascular:     Rate and Rhythm: Normal rate and regular rhythm.     Heart sounds: No murmur heard.    No gallop.  Pulmonary:     Effort: Pulmonary effort is normal. No respiratory distress.     Breath sounds: No wheezing.  Abdominal:     General: Bowel sounds are normal. There is no distension.     Palpations: Abdomen is soft.  Musculoskeletal:     Cervical back: Normal range of motion.     Comments: Right lower leg surgical dressing in place; right forearm splint in place. RUE tender to minimal palpation and movement.  Skin:    General: Skin is warm.  Neurological:     Mental Status: She  is alert and oriented to person, place, and time.     Comments: Underlying cognitive impairment. Oriented to person, place, reason she's here. Fair insight and awareness. Normal speech, language. CN exam unremarkable. LUE 4/5. RUE limited by pain/ortho. Does move all muscle groups. LLE 2/5 prox to 4/5 distal. RLE tr-1/5 prox to 3/5 distal (can wiggle toes within splint). No focal sensory loss.   Psychiatric:     Comments: Pt cooperative but somewhat irritable.      Results for orders placed or performed during the hospital encounter of 05/08/22 (from the past 48 hour(s))  Glucose, capillary     Status: Abnormal   Collection Time: 05/17/22  5:15 PM  Result Value Ref Range   Glucose-Capillary 164 (H) 70 - 99 mg/dL    Comment: Glucose reference range applies only to samples taken after fasting for at least 8 hours.  Glucose, capillary     Status: Abnormal   Collection Time: 05/17/22  9:44 PM  Result Value Ref Range   Glucose-Capillary 158 (H) 70 - 99 mg/dL    Comment: Glucose reference range applies only to samples taken after fasting for at least 8 hours.  Sodium, urine, random     Status: None   Collection Time: 05/17/22 11:22 PM  Result Value Ref Range   Sodium, Ur 72 mmol/L    Comment: Performed at [facility], [place] [address], [place], [place] [address]  Creatinine, urine, random     Status: None   Collection Time: 05/17/22 11:22 PM  Result Value Ref Range   Creatinine, Urine 41 mg/dL    Comment: Performed at [facility], [place] [address], [place], [place] [address]  Urinalysis, Routine w reflex microscopic Urine, Catheterized     Status: Abnormal   Collection Time: 05/17/22 11:22 PM  Result Value Ref Range   Color, Urine STRAW (A) YELLOW   APPearance CLEAR CLEAR   Specific Gravity, Urine 1.009 1.005 - 1.030   pH 6.0 5.0 - 8.0   Glucose, UA NEGATIVE NEGATIVE mg/dL   Hgb urine dipstick SMALL (A) NEGATIVE   Bilirubin Urine NEGATIVE NEGATIVE   Ketones, ur NEGATIVE NEGATIVE mg/dL   Protein, ur 30 (A) NEGATIVE mg/dL   Nitrite NEGATIVE NEGATIVE   Leukocytes,Ua NEGATIVE NEGATIVE   RBC / HPF 0-5 0 - 5 RBC/hpf   WBC, UA 0-5 0 - 5 WBC/hpf   Bacteria, UA NONE SEEN NONE SEEN   Squamous Epithelial / LPF 0-5 0 - 5    Comment: Performed at [facility], [place] [address] [place], [place] [address]  Renal function panel     Status: Abnormal   Collection Time: 05/18/22  4:35 AM  Result Value Ref Range   Sodium 143 135 - 145 mmol/L   Potassium 4.2 3.5 - 5.1 mmol/L   Chloride 114 (H) 98 - 111 mmol/L   CO2 20 (L) 22 - 32 mmol/L   Glucose, Bld 120 (H) 70 - 99 mg/dL    Comment: Glucose reference range applies only to samples taken after fasting for at least 8 hours.   BUN 56 (H) 8 - 23 mg/dL   Creatinine, Ser 3.18 (H) 0.44 - 1.00 mg/dL   Calcium 8.0 (L) 8.9 - 10.3 mg/dL   Phosphorus 3.8 2.5 - 4.6 mg/dL   Albumin 2.1 (L) 3.5 - 5.0 g/dL   GFR, Estimated 15 (L) >60 mL/min    Comment: (NOTE) Calculated using the CKD-EPI Creatinine Equation (2021)  Anion gap 9 5 - 15    Comment: Performed at [facility], [place] [address] [place], [place] [address]  CBC with Differential/Platelet     Status: Abnormal   Collection Time: 05/18/22  4:35 AM  Result Value Ref Range    WBC 18.7 (H) 4.0 - 10.5 K/uL   RBC 3.87 3.87 - 5.11 MIL/uL   Hemoglobin 10.0 (L) 12.0 - 15.0 g/dL   HCT 31.2 (L) 36.0 - 46.0 %   MCV 80.6 80.0 - 100.0 fL   MCH 25.8 (L) 26.0 - 34.0 pg   MCHC 32.1 30.0 - 36.0 g/dL   RDW 18.6 (H) 11.5 - 15.5 %   Platelets 210 150 - 400 K/uL   nRBC 0.0 0.0 - 0.2 %   Neutrophils Relative % 81 %   Neutro Abs 15.1 (H) 1.7 - 7.7 K/uL   Lymphocytes Relative 8 %   Lymphs Abs 1.5 0.7 - 4.0 K/uL   Monocytes Relative 9 %   Monocytes Absolute 1.6 (H) 0.1 - 1.0 K/uL   Eosinophils Relative 1 %   Eosinophils Absolute 0.3 0.0 - 0.5 K/uL   Basophils Relative 0 %   Basophils Absolute 0.1 0.0 - 0.1 K/uL   Immature Granulocytes 1 %   Abs Immature Granulocytes 0.16 (H) 0.00 - 0.07 K/uL    Comment: Performed at [facility], [place] [address], [place], [place] [address]  Magnesium     Status: None   Collection Time: 05/18/22  4:35 AM  Result Value Ref Range   Magnesium 2.0 1.7 - 2.4 mg/dL    Comment: Performed at [facility], [place] [address], [place], [place] [address]  Glucose, capillary     Status: Abnormal   Collection Time: 05/18/22  7:58 AM  Result Value Ref Range   Glucose-Capillary 127 (H) 70 - 99 mg/dL    Comment: Glucose reference range applies only to samples taken after fasting for at least 8 hours.  Glucose, capillary     Status: Abnormal   Collection Time: 05/18/22  1:10 PM  Result Value Ref Range   Glucose-Capillary 124 (H) 70 - 99 mg/dL    Comment: Glucose reference range applies only to samples taken after fasting for at least 8 hours.  Glucose, capillary     Status: Abnormal   Collection Time: 05/18/22  5:25 PM  Result Value Ref Range   Glucose-Capillary 110 (H) 70 - 99 mg/dL    Comment: Glucose reference range applies only to samples taken after fasting for at least 8 hours.  Glucose, capillary     Status: Abnormal   Collection Time: 05/18/22  8:42 PM  Result Value Ref Range   Glucose-Capillary 132 (H) 70 -  99 mg/dL    Comment: Glucose reference range applies only to samples taken after fasting for at least 8 hours.  Basic metabolic panel     Status: Abnormal   Collection Time: 05/19/22  4:27 AM  Result Value Ref Range   Sodium 146 (H) 135 - 145 mmol/L   Potassium 4.2 3.5 - 5.1 mmol/L   Chloride 118 (H) 98 - 111 mmol/L   CO2 21 (L) 22 - 32 mmol/L   Glucose, Bld 82 70 - 99 mg/dL    Comment: Glucose reference range applies only to samples taken after fasting for at least 8 hours.   BUN 48 (H) 8 - 23 mg/dL   Creatinine, Ser 3.04 (H) 0.44 - 1.00 mg/dL   Calcium 8.0 (  L) 8.9 - 10.3 mg/dL   GFR, Estimated 16 (L) >60 mL/min    Comment: (NOTE) Calculated using the CKD-EPI Creatinine Equation (2021)    Anion gap 7 5 - 15    Comment: Performed at [facility], [place] [address] [place], [place] [address]  Glucose, capillary     Status: None   Collection Time: 05/19/22  7:47 AM  Result Value Ref Range   Glucose-Capillary 86 70 - 99 mg/dL    Comment: Glucose reference range applies only to samples taken after fasting for at least 8 hours.  CBC with Differential/Platelet     Status: Abnormal   Collection Time: 05/19/22 10:29 AM  Result Value Ref Range   WBC 16.4 (H) 4.0 - 10.5 K/uL   RBC 3.51 (L) 3.87 - 5.11 MIL/uL   Hemoglobin 9.0 (L) 12.0 - 15.0 g/dL   HCT 27.7 (L) 36.0 - 46.0 %   MCV 78.9 (L) 80.0 - 100.0 fL   MCH 25.6 (L) 26.0 - 34.0 pg   MCHC 32.5 30.0 - 36.0 g/dL   RDW 19.4 (H) 11.5 - 15.5 %   Platelets 217 150 - 400 K/uL   nRBC 0.0 0.0 - 0.2 %   Neutrophils Relative % 83 %   Neutro Abs 13.6 (H) 1.7 - 7.7 K/uL   Lymphocytes Relative 8 %   Lymphs Abs 1.3 0.7 - 4.0 K/uL   Monocytes Relative 8 %   Monocytes Absolute 1.2 (H) 0.1 - 1.0 K/uL   Eosinophils Relative 1 %   Eosinophils Absolute 0.2 0.0 - 0.5 K/uL   Basophils Relative 0 %   Basophils Absolute 0.0 0.0 - 0.1 K/uL   Immature Granulocytes 0 %   Abs Immature Granulocytes 0.07 0.00 - 0.07 K/uL    Comment:  Performed at [facility], [place] [address] [place], [place] [address]   No results found.    Blood pressure (!) 151/83, pulse (!) 102, temperature 99.8 F (37.7 C), temperature source Oral, resp. rate 20, height _0  (1.626 m), weight 78.9 kg, SpO2 98 %.  Medical Problem List and Plan: 1. Functional deficits secondary to multiple trauma after ground level fall  -patient may not yet shower  -ELOS/Goals: 12-14 days, min assist to supervision goals 2.  Antithrombotics: -DVT/anticoagulation:  Pharmaceutical: Lovenox 30 mg daily -venous dopplers appear normal (where able to test given RLE splint)  -antiplatelet therapy: none 3. Pain Management: Tylenol, tramadol, Robaxin prn 4. Mood/Behavior/Sleep: LCSW to evaluate and provide emotional support  -antipsychotic agents: n/a 5. Neuropsych/cognition: This patient is not capable of making decisions on her own behalf. Consider SLP eval. 6. Skin/Wound Care: Routine skin care checks  -monitor surgical incision 7. Fluids/Electrolytes/Nutrition: Routine Is and Os and follow-up chemistries  -ensure adequate PO hydration 8: Right tib-fib fracture s/p ORIF 11/2 -NWB RLE  -follow-up with Dr. Mardelle Matte 9: Right non-displaced wrist fracture: WBAT tolerated through elbow  -maintain wrist splint  -follow-up with Dr. Greta Doom 10: Pneumonia: continue ceftriaxone and azithromycin  -cough/deep breath/IS/FV 11: Hypertension: monitor TID and prn 12: CKD 4: continue calcitrol and sodium bicarb 13: Hyperlipidemia: continue Lipitor 14: DM2: CBGs 4 times daily  -continue Levemir 10 units daily  -continue sliding scale  -consider restarting home insulin 4 units with meals 15: Constipation: continue senna and Colace daily; prns 16: Anemia; multi-factorial: follow-up CBC 17: Leukocytosis/low grade fever: abx for pneumonia continue 18: AKI: serum creatinine improving, still elevated (baseline 2.3-2.6)  -follow-up on BMP   Barbie Banner,  PA-C 05/19/2022

## 2022-05-20 LAB — CBC WITH DIFFERENTIAL/PLATELET
Abs Immature Granulocytes: 0.13 10*3/uL — ABNORMAL HIGH (ref 0.00–0.07)
Basophils Absolute: 0.1 10*3/uL (ref 0.0–0.1)
Basophils Relative: 0 %
Eosinophils Absolute: 0.3 10*3/uL (ref 0.0–0.5)
Eosinophils Relative: 2 %
HCT: 29.6 % — ABNORMAL LOW (ref 36.0–46.0)
Hemoglobin: 9.6 g/dL — ABNORMAL LOW (ref 12.0–15.0)
Immature Granulocytes: 1 %
Lymphocytes Relative: 10 %
Lymphs Abs: 1.5 10*3/uL (ref 0.7–4.0)
MCH: 25.6 pg — ABNORMAL LOW (ref 26.0–34.0)
MCHC: 32.4 g/dL (ref 30.0–36.0)
MCV: 78.9 fL — ABNORMAL LOW (ref 80.0–100.0)
Monocytes Absolute: 1 10*3/uL (ref 0.1–1.0)
Monocytes Relative: 6 %
Neutro Abs: 12.1 10*3/uL — ABNORMAL HIGH (ref 1.7–7.7)
Neutrophils Relative %: 81 %
Platelets: 268 10*3/uL (ref 150–400)
RBC: 3.75 MIL/uL — ABNORMAL LOW (ref 3.87–5.11)
RDW: 19.3 % — ABNORMAL HIGH (ref 11.5–15.5)
WBC: 15 10*3/uL — ABNORMAL HIGH (ref 4.0–10.5)
nRBC: 0 % (ref 0.0–0.2)

## 2022-05-20 LAB — COMPREHENSIVE METABOLIC PANEL
ALT: 26 U/L (ref 0–44)
AST: 27 U/L (ref 15–41)
Albumin: 1.8 g/dL — ABNORMAL LOW (ref 3.5–5.0)
Alkaline Phosphatase: 72 U/L (ref 38–126)
Anion gap: 6 (ref 5–15)
BUN: 44 mg/dL — ABNORMAL HIGH (ref 8–23)
CO2: 21 mmol/L — ABNORMAL LOW (ref 22–32)
Calcium: 8.2 mg/dL — ABNORMAL LOW (ref 8.9–10.3)
Chloride: 118 mmol/L — ABNORMAL HIGH (ref 98–111)
Creatinine, Ser: 2.74 mg/dL — ABNORMAL HIGH (ref 0.44–1.00)
GFR, Estimated: 18 mL/min — ABNORMAL LOW (ref >60)
Glucose, Bld: 98 mg/dL (ref 70–99)
Potassium: 4 mmol/L (ref 3.5–5.1)
Sodium: 145 mmol/L (ref 135–145)
Total Bilirubin: 0.7 mg/dL (ref 0.3–1.2)
Total Protein: 5.2 g/dL — ABNORMAL LOW (ref 6.5–8.1)

## 2022-05-20 LAB — GLUCOSE, CAPILLARY
Glucose-Capillary: 91 mg/dL (ref 70–99)
Glucose-Capillary: 180 mg/dL — ABNORMAL HIGH (ref 70–99)
Glucose-Capillary: 134 mg/dL — ABNORMAL HIGH (ref 70–99)
Glucose-Capillary: 218 mg/dL — ABNORMAL HIGH (ref 70–99)

## 2022-05-20 LAB — LEGIONELLA PNEUMOPHILA SEROGP 1 UR AG: L. pneumophila Serogp 1 Ur Ag: NEGATIVE

## 2022-05-20 MED ORDER — HYDROCODONE-ACETAMINOPHEN 5-325 MG PO TABS
1.0000 | ORAL_TABLET | Freq: Four times a day (QID) | ORAL | Status: DC | PRN
  Administered 2022-05-20 – 2022-05-21 (×3): 1 via ORAL
  Filled 2022-05-20 (×3): qty 1

## 2022-05-20 MED ORDER — LIDOCAINE 5 % EX PTCH
3.0000 | MEDICATED_PATCH | CUTANEOUS | Status: DC
  Administered 2022-05-20 – 2022-06-05 (×12): 3 via TRANSDERMAL
  Filled 2022-05-20 (×17): qty 3

## 2022-05-20 MED ORDER — METOPROLOL TARTRATE 50 MG PO TABS
50.0000 mg | ORAL_TABLET | Freq: Two times a day (BID) | ORAL | Status: DC
  Administered 2022-05-20 – 2022-06-06 (×33): 50 mg via ORAL
  Filled 2022-05-20 (×34): qty 1

## 2022-05-20 MED ORDER — TRAMADOL HCL 50 MG PO TABS
50.0000 mg | ORAL_TABLET | Freq: Two times a day (BID) | ORAL | Status: DC | PRN
  Administered 2022-05-22 – 2022-05-29 (×3): 50 mg via ORAL
  Filled 2022-05-20 (×4): qty 1

## 2022-05-20 NOTE — Patient Care Conference (Signed)
Inpatient RehabilitationTeam Conference and Plan of Care Update Date: 05/20/2022   Time: 11:00 AM    Patient Name: Miranda Roman      Medical Record Number: [id number]  Date of Birth: [birth year] Sex: Female         Room/Bed: 4M02C/4M02C-01 Payor Info: Payor: [place] / Plan: [facility] MEDICARE / Product Type: *No Product type* /    Admit Date/Time:  05/19/2022  2:52 PM  Primary Diagnosis:  Critical polytrauma  Hospital Problems: Principal Problem:   Critical polytrauma Active Problems:   Right tibial fracture    Expected Discharge Date: Expected Discharge Date:  (12-14 days)  Team Members Present: Physician leading conference: Dr. Courtney Heys Social Worker Present: [name], [place] Nurse Present: Tacy Learn, RN PT Present: Ailene Rud, PT OT Present: Willeen Cass, OT PPS Coordinator present : Gunnar Fusi, SLP     Current Status/Progress Goal Weekly Team Focus  Bowel/Bladder    Incontinent B/B   To be continent of B/B  Toilet every 2 hours and prn    Swallow/Nutrition/ Hydration               ADL's   evals pending   evals pending   evals pending    Mobility   eval pending   eval pending  eval pending    Communication                Safety/Cognition/ Behavioral Observations               Pain    Pain to right lower extremity   Pain will be managed 4 out of 10 with prn pain medication  Assess for pain every 4 hours and before each therapy session    Skin    Incision to RLE dressings remains intact. Splint to right wrist remains intact   Skin will remain free of infection and breakdown  Assess skin every shift and prn      Discharge Planning:  TBA. Per EMR, pt lives with her dtr and she receives an aide M-F 10am-12pm. SW will confirm there are no barriers to discharge.   Team Discussion: Critical polytrauma. Incontinent B/B. Pain to right lower extremity. Pain managed with prn and scheduled  medications. Dressing to RLE remain intact without drainage being noted. NWB to RLE. Splint in place to right wrist. Weight bearing thru elbow only. Lidoderm patch added, Norco 5/325 added and Tramadol added to assist in pain control. Noted PNU. WBC decreasing. Patient has aid for home 2 hours x 5 days a week. Also had speech at home for swallowing and cognition. Speech consult added. Evaluations pending for OT and PT  Patient on target to meet rehab goals: Evaluations pending.   *See Care Plan and progress notes for long and short-term goals.   Revisions to Treatment Plan:  Medication adjustments, monitor labs  Teaching Needs: Medications, safety, gait/transfer training, skin/wound care, etc.   Current Barriers to Discharge: Inaccessible home environment, Decreased caregiver support, Home enviroment access/layout, IV antibiotics, Incontinence, Wound care, Weight, Weight bearing restrictions, and Behavior  Possible Resolutions to Barriers: Family education, nursing education, skin/wound care, order recommended DME     Medical Summary Current Status: inconitnent B/B; chronic- pain is very limiting- bivalve cast- RLE and RUE in splint with ACE wraps- no other skin issues-  Barriers to Discharge: Incontinence;Inadequate Nutritional Intake;Medical stability;Infection/IV Antibiotics;Uncontrolled Diabetes;Renal Insufficiency/Failure;Uncontrolled Pain;Weight bearing restrictions;Behavior/Mood  Barriers to Discharge Comments: pain extrememly limiting- just finished eval- for PT- CKD 4 and  AKI; improving; will add Norco 5/325 mg q6 hours prn- goals min-mod A Possible Resolutions to [employer] Focus: had SLP at home and in acute- mild aspiration risk- regular/thin- pnmeumonia caused by aspiration?- 19/26 on SLUMs- as above, added nroc for pain- monitor for loopiness; - hasn't been seen by SW, OT yet- d/c 12-14 days?   Continued Need for Acute Rehabilitation Level of Care: The patient requires  daily medical management by a physician with specialized training in physical medicine and rehabilitation for the following reasons: Direction of a multidisciplinary physical rehabilitation program to maximize functional independence : Yes Medical management of patient stability for increased activity during participation in an intensive rehabilitation regime.: Yes Analysis of laboratory values and/or radiology reports with any subsequent need for medication adjustment and/or medical intervention. : Yes   I attest that I was present, lead the team conference, and concur with the assessment and plan of the team.   Ernest Pine 05/20/2022, 4:33 PM

## 2022-05-20 NOTE — Evaluation (Signed)
Physical Therapy Assessment and Plan  Patient Details  Name: Miranda Roman MRN: [id number] Date of Birth: 1945-11-03  PT Diagnosis: Cognitive deficits, Difficulty walking, Edema, Muscle weakness, and Pain in joint Rehab Potential: Fair ELOS: 14-18 days   Today's Date: 05/20/2022 PT Individual Time: 0840-1000 PT Individual Time Calculation (min): 80 min    Hospital Problem: Principal Problem:   Critical polytrauma Active Problems:   Right tibial fracture   Past Medical History:  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   Borderline steroid-induced glaucoma, 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   OD, Early elevated intraocular pressure to 28 mm we will discontinue all topical medications from surgery in the right eye today   Bronchiectasis without complication 80/09/4915   Cataract 07/03/2020   Chest pain at rest 01/30/2015   Chronic kidney disease, stage 4 (severe) 12/07/2020   Closed fracture of right tibial plateau 07/17/2021   Cyst of left kidney 03/10/2019   Needs repeated US 08/2019   Dysphagia, neurologic    Elevated d-dimer 12/20/2019   Facet arthritis of lumbar region 02/27/2019   Gastroesophageal reflux disease 08/26/2016   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   Hyperglycemia due to type 2 diabetes mellitus 12/07/2020   Hyperlipidemia    Hypertensive nephropathy 10/15/2017   Inadequate oral nutritional intake    Leukocytosis 12/22/2019    Long term (current) use of insulin 12/07/2020   Low grade squamous intraepithelial lesion (LGSIL) on cervicovaginal cytologic smear 12/15/2018   Lumbago 02/27/2019   Macular atrophy, 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  Further history patient suffered possible Wernicke's encephalopathy the past, a type of the vitamin deficiencies, thiamine that can also affect the retina if there is significant damage which may not be recoverable.   Mild neurocognitive disorder due to multiple etiologies 01/23/2022   Osteoporosis 12/15/2018   Overweight with body mass index (BMI) 25.0-29.9 12/15/2018   PAD (peripheral artery disease) 06/06/2019   Pain in right hand 04/27/2020   Pancreatic cyst 06/06/2019   Polyneuropathy due to type 2 diabetes mellitus    Posterior vitreous detachment of left eye 08/05/2021   Proliferative diabetic retinopathy of right eye 08/22/2021   Likely contributory cause of nonclearing vitreous hemorrhage in the right eye now status post PRP likely to resolve in 2 quiescent PDR, post vitrectomy PRP   Pulmonary hypertension 02/04/2021   Right knee pain 07/10/2021   Sickle-cell trait 12/07/2020   Type II diabetes mellitus 03/10/2012   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   Vitreous hemorrhage of right eye 08/05/2021   Vitrectomy PRP 08-14-2021   Past Surgical History:  Past Surgical History:  Procedure Laterality Date   BONE RESECTION  01/2006   wrist   GASTRIC BYPASS  08/14/2009   OPEN REDUCTION INTERNAL FIXATION (ORIF) TIBIA/FIBULA FRACTURE Right 05/10/2022   Procedure: OPEN REDUCTION INTERNAL FIXATION (ORIF) TIBIA/FIBULA FRACTURE;  Surgeon: Marchia Bond, MD;  Location:  [facility];  Service: Orthopedics;  Laterality: Right;   OTHER SURGICAL HISTORY     sickle cell retinopathy laser surgery   TUBAL LIGATION  04/28/1977    Assessment & Plan Clinical Impression: Miranda Arca.  Roman is a right-handed 76 year old female brought to the emergency department via EMS from home for mechanical fall on 05/08/2022.[name]  She had physical deformity to the right lower extremity and right wrist.  She had recently moved in with her daughter and typically ambulates with a rolling walker.  She tripped over the family dogs and was complaining of right wrist and right proximal lower leg pain.  Medical history is significant for diabetes mellitus, hypertension and chronic kidney disease stage IV.  Initial imaging revealed no spinal deformity.  Positive for nondisplaced right distal ulnar/radius fracture.  Dr. Greta Doom consulted and fracture is nonoperable and patient placed in splint.  She was admitted and orthopedic surgery consultation obtained.  Imaging revealed right tib-fib fracture and she was placed into a long-leg splint.  On 11/4, she was taken to the operating room by Dr. Mardelle Matte and underwent ORIF with IM nail fixation of right tib-fib fracture and closed management of right distal radius and ulnar fracture.  She developed acute kidney injury with serum creatinine elevated to 3.5.  IVFs continued. Nephrology was consulted on 11/11.  She developed fever with worsening leukocytosis on 11/9.  Urine culture and chest x-ray ordered.  She complained of abdominal pain 11/10 and CT of abdomen pelvis performed. No acute abnormality. Fever worsened to 103.1 on 11/10 and she was tachycardic with increased respiratory rate and desaturating.  Blood cultures obtained.  Empirically started on IV Rocephin and IV azithromycin.  She is nonweightbearing in the right lower extremity and weightbearing as tolerated to the elbow of the right upper extremity. Patient transferred to CIR on 05/19/2022 .     Patient currently requires total with mobility secondary to muscle weakness, decreased cardiorespiratoy endurance, decreased initiation and delayed processing, and decreased sitting balance, decreased standing balance,  decreased balance strategies, and difficulty maintaining precautions.  Prior to hospitalization, patient was modified independent  with mobility and lived with Daughter in a House home.  Home access is 3Stairs to enter.  Patient will benefit from skilled PT intervention to maximize safe functional mobility, minimize fall risk, and decrease caregiver burden for planned discharge home with 24 hour assist.  Anticipate patient will benefit from follow up [facility] at discharge.  PT - End of Session Activity Tolerance: Tolerates 10 - 20 min activity with multiple rests Endurance Deficit: Yes Endurance Deficit Description: Pt fatigues quickly with any activity PT Assessment Rehab Potential (ACUTE/IP ONLY): Fair PT Barriers to Discharge: [place] home environment;Incontinence;Wound Care;Weight bearing restrictions PT Patient demonstrates impairments in the following area(s): Balance;Pain;Behavior;Edema;Sensory;Motor;Skin Integrity;Endurance PT Transfers Functional Problem(s): Bed Mobility;Bed to Chair;Car PT Locomotion Functional Problem(s): Wheelchair Mobility;Other (comment) PT Plan PT Intensity: Minimum of 1-2 x/day ,45 to 90 minutes PT Frequency: 5 out of 7 days PT Duration Estimated Length of Stay: 14-18 days PT Treatment/Interventions: Ambulation/gait training;Cognitive remediation/compensation;Discharge planning;DME/adaptive equipment instruction;Functional mobility training;Pain management;Psychosocial support;Splinting/orthotics;Therapeutic Activities;UE/LE Strength taining/ROM;Visual/perceptual remediation/compensation;Wheelchair propulsion/positioning;UE/LE Coordination activities;Therapeutic Exercise;Stair training;Skin care/wound management;Patient/family education;Neuromuscular re-education;Functional electrical stimulation;Disease management/prevention;Community reintegration;Balance/vestibular training PT Transfers Anticipated Outcome(s): mod A PT Locomotion Anticipated Outcome(s): mod A  w/c with hemi technique PT Recommendation Recommendations for Other Services: Speech consult Follow Up Recommendations: Home health PT Patient destination: Home Equipment Recommended: To be determined;Other (comment) Equipment Details: pt owns RW and w/c, will need platform   PT Evaluation  Precautions/Restrictions Precautions Precautions: Fall Required Braces or Orthoses: Splint/Cast Splint/Cast: RUE and RLE Restrictions Weight Bearing Restrictions: Yes RUE Weight Bearing: Weight bear through elbow only RLE Weight Bearing: Non weight bearing General   Vital Signs Pain Pain Assessment Pain Scale: 0-10 Pain Score: 5  Pain Type: Acute pain Pain Location: Foot Pain Orientation: Right Pain Descriptors / Indicators: Aching Pain Onset: On-going Pain Intervention(s): Other (Comment) (pain meds given this AM) Pain Interference   Home Living/Prior Functioning Home Living Available Help at Discharge: Family;Other (Comment);Personal care attendant (Daughter, Son-in-law, 2 sons) Type of Home: House Home Access: Stairs to enter Technical brewer of Steps: 3 Entrance Stairs-Rails: None Home Layout: Two level;1/2 bath on main level;Able to live on main level with bedroom/bathroom Alternate Level Stairs-Number of Steps: flight Bathroom Shower/Tub: Tub/shower unit;Curtain Bathroom Toilet: Handicapped height Bathroom Accessibility: Yes Additional Comments: Has a shower chair with back, extended tub bench, BSC, hurricane cane, RW, and rolator, manual wheelchair, Home health aide assists M-F 10-12. Able to complete UB bathing and dressing with SBA, LB bathing and dressing Min A. Completed functional mobility at baseline with RW or rolator at Mod I.  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, puzzles, entertaining  family and friends Leisure: Hobbies-yes (Comment) Vision/Perception  Vision - History Ability to See in Adequate Light: 1 Impaired Vision - Assessment Eye Alignment: Within Functional Limits Ocular Range of Motion: Within Functional Limits Perception Perception: Within Functional Limits Praxis Praxis: Intact  Cognition Overall Cognitive Status: History of cognitive impairments - at baseline Arousal/Alertness: Awake/alert Memory: Impaired Memory Impairment: Decreased short term memory Awareness: Impaired Problem Solving: Impaired Safety/Judgment: Impaired Sensation Sensation Light Touch: Appears Intact Hot/Cold: Appears Intact Proprioception: Impaired Detail Proprioception Impaired Details: Impaired RUE (due to fracture) Stereognosis: Impaired Detail Stereognosis Impaired Details: Impaired RUE Additional Comments: Impaired due to recent fracture Coordination Gross Motor Movements are Fluid and Coordinated: No Fine Motor Movements are Fluid and Coordinated: No Coordination and Movement Description: Due to WB status and recent fractures pt presents with impaired coordination Finger Nose Finger Test: Slow and delayed BUE Motor  Motor Motor: Other (comment) Motor - Skilled Clinical Observations: delayed   Trunk/Postural Assessment  Cervical Assessment Cervical Assessment: Exceptions to [facility] (forward head) Thoracic Assessment Thoracic Assessment: Exceptions to [facility] (rounded shoulders) Lumbar Assessment Lumbar Assessment: Exceptions to [facility] (posterior pelvic tilt) Postural Control Postural Control: Deficits on evaluation (delayed and insufficient)  Balance Balance Balance Assessed: No (See PT evaluation) Static Sitting Balance Static Sitting - Balance Support: Left upper extremity supported;Feet supported Static Sitting - Level of Assistance: 4: Min assist Dynamic Sitting Balance Dynamic Sitting - Balance Support: Left upper extremity supported;Feet supported Dynamic  Sitting - Level of Assistance: 3: Mod assist Extremity Assessment  RUE Assessment RUE Assessment: Exceptions to [facility] Passive Range of Motion (PROM) Comments: Functional shoulder and elbow P/ROM in all ranges. Wrist: N/A due to UE cast and precautions Active Range of Motion (AROM) Comments: Functional shoulder and elbow P/ROM in all ranges. Wrist: N/A due to UE cast and precautions. Limited finger flexion. Able to form a closed fist 25% General Strength Comments: Generalized weakness in shoulder and elbow in all ranges. LUE Assessment LUE Assessment: Exceptions to [facility] General Strength Comments: Gneralized weakness RLE Assessment RLE Assessment: Exceptions to [facility] General Strength Comments: Pt able to wiggle toes within cast, limited testing d/t WB precautions and high pain levels LLE  Assessment LLE Assessment: Exceptions to [facility] General Strength Comments: grossly 3+/5  Care Tool Care Tool Bed Mobility Roll left and right activity   Roll left and right assist level: Total Assistance - Patient < 25%    Sit to lying activity   Sit to lying assist level: 2 Helpers    Lying to sitting on side of bed activity   Lying to sitting on side of bed assist level: the ability to move from lying on the back to sitting on the side of the bed with no back support.: Maximal Assistance - Patient 25 - 49%     Care Tool Transfers Sit to stand transfer   Sit to stand assist level: 2 Helpers    Chair/bed transfer   Chair/bed transfer assist level: Total Assistance - Patient < 25%     Psychologist, counselling transfer activity did not occur: Safety/medical concerns        Care Tool Locomotion Ambulation Ambulation activity did not occur: Safety/medical concerns        Walk 10 feet activity Walk 10 feet activity did not occur: Safety/medical concerns       Walk 50 feet with 2 turns activity Walk 50 feet with 2 turns activity did not occur: Safety/medical concerns      Walk  150 feet activity Walk 150 feet activity did not occur: Safety/medical concerns      Walk 10 feet on uneven surfaces activity Walk 10 feet on uneven surfaces activity did not occur: Safety/medical concerns      Stairs Stair activity did not occur: Safety/medical concerns        Walk up/down 1 step activity Walk up/down 1 step or curb (drop down) activity did not occur: Safety/medical concerns      Walk up/down 4 steps activity Walk up/down 4 steps activity did not occur: Safety/medical concerns      Walk up/down 12 steps activity Walk up/down 12 steps activity did not occur: Safety/medical concerns      Pick up small objects from floor Pick up small object from the floor (from standing position) activity did not occur: Safety/medical concerns      Wheelchair Is the patient using a wheelchair?: Yes (Transport only d/t WB restrictions) Type of Wheelchair: Manual Wheelchair activity did not occur: Safety/medical concerns      Wheel 50 feet with 2 turns activity Wheelchair 50 feet with 2 turns activity did not occur: Safety/medical concerns    Wheel 150 feet activity Wheelchair 150 feet activity did not occur: Safety/medical concerns      Refer to Care Plan for Long Term Goals  SHORT TERM GOAL WEEK 1 PT Short Term Goal 1 (Week 1): Pt will perform STS +1 assist PT Short Term Goal 2 (Week 1): Pt will tolerate sitting OOB >1 hour between sessions PT Short Term Goal 3 (Week 1): Pt will perform least restrictive transfer with max A  Recommendations for other services: None   Skilled Therapeutic Intervention Evaluation completed (see details above) with patient education regarding purpose of PT evaluation, PT POC and goals, therapy schedule, weekly team meetings, and other CIR information including safety plan and fall risk safety. Pt's daughter present and assisted with subjective exam.  Pt performed the below functional mobility tasks with the specified levels of skilled cuing and  assistance.  Pt required max-tot A for bed mobility with increased time for processing. Sit to stand with PFRW required tot A x2.  Tot A squat pivot, but pt with limited participation. A slide board transfer is likely more appropriate for this pt, but there was insufficient time to attempt this session. Pt with great difficulty maintaining WB precautions and highly limited by pain at time of eval. Pt returned to bed after session and was left with all needs in reach and alarm active.   Mobility Bed Mobility Bed Mobility: Rolling Right;Rolling Left Rolling Right: Maximal Assistance - Patient 25-49% Rolling Left: Total Assistance - Patient < 25%;2 Helpers Transfers Transfers:  (Not assessed at OT eval. PT completed Max A squat pivot transfer during evaluation.) Sit to Stand: 2 Helpers Squat Pivot Transfers: Total Assistance - Patient < 25% Transfer (Assistive device): Right platform walker Locomotion  Gait Ambulation: No Gait Gait: No Stairs / Additional Locomotion Stairs: No Wheelchair Mobility Wheelchair Mobility: No   Discharge Criteria: Patient will be discharged from PT if patient refuses treatment 3 consecutive times without medical reason, if treatment goals not met, if there is a change in medical status, if patient makes no progress towards goals or if patient is discharged from hospital.  The above assessment, treatment plan, treatment alternatives and goals were discussed and mutually agreed upon: by patient  Mickel Fuchs 05/20/2022, 12:51 PM

## 2022-05-20 NOTE — Progress Notes (Signed)
Inpatient Rehabilitation  Patient information reviewed and entered into eRehab system by Lace Chenevert M. Symphoni Helbling, M.A., CCC/SLP, PPS Coordinator.  Information including medical coding, functional ability and quality indicators will be reviewed and updated through discharge.    

## 2022-05-20 NOTE — Progress Notes (Signed)
[facility] Progress Note  Subjective: pt seen in room. Creat down 2.74, BP's 150/75 range  Vitals:   05/19/22 1535 05/19/22 2002 05/20/22 0627 05/20/22 1545  BP:  (!) 150/75 (!) 157/76 (!) 143/77  Pulse:  (!) 102 100 86  Resp:  _0 Temp:  98.8 F (37.1 C) 98.6 F (37 C) 97.7 F (36.5 C)  TempSrc:  Oral Oral Oral  SpO2:  97% 97% 96%  Weight: 78.9 kg     Height: 5' 4" (1.626 m)       Exam: Gen alert, elderly AAF no distress No jvd or bruits Chest clear bilat to bases RRR no MRG Abd soft ntnd no mass or ascites +bs Ext no LE or UE edema Neuro is alert, Ox 3 , nf      Home meds include - amlodipine 2.5 qd, atorvastatin, gabapentin, humalog/ levemir insulin, metoprolol 50 bid, MVI, sod bicarb 1 bid, prns/ vits/ supps     Date                           Creat               eGFR   2019- 2020                2.44- 2.84   March 2021               2.36   Jul- aug 2021            1.44-  2.05 May 2020                   1.91- 2.30   Jun- nov 2022           2.30                    Feb 2023                   2.59                 19 ml/min     July 2023                  2.43                 20 ml/min   Sept 2023                  2.60    11/02                                    2.60                 19 ml/min    11/04                                    3.07     11/06                       2.36     11/08                       2.87  11/10                       3.09                 15 ml/min     11/11                       3.57                 13 ml/min      UA 11/9 - 100 prot, 0-5 rbc, 6-10 wbc, rare bact    CT abd w/o contrast 11/10 - Adrenals/Urinary Tract: The adrenal glands are within normal limits. There is a cyst in the mid right kidney no renal calculus or hydronephrosis. The bladder is unremarkable   Assessment/ Plan: AKI on CKD 4 - B/l creatinine 2.43- 2.60, from feb- sept  2023, eGFR 19- 20 ml/min. Pt is f/b Dr Royce Macadamia at [facility]. Creat  here was 2.6 on admission in setting of fall and tib/ fib fracture. Creat was stable for several days until last 48 hrs when pt developed high fevers w/ suspected infection/ PNA. BP dropped slightly lower. Creat increased up to 3.1 and then 3.5. CT showed no obstruction and UA was unremarkable. No nephrotoxic exposure. AKI suspected due to new infection w/ cap leak and relative hypotension/ hypoperfusion. With IVF's, lowering of BP meds and IV abx her creat peaked at 3.5 and now is down to 2.7 today which is in her baseline range. No further suggestions. Please call w/ any questions. Pt will f/u w/ Dr Royce Macadamia after d/c. Will sign off.  HTN - back on her usual home meds of norvasc 2.5 and metoprolol 50 bid Fall w/ R tib/ fib fracture - s/p ORIF 11/4 CAP - per pmd DM2 - per pmd.  Dispo - on CIR now      Rob Jamiah Recore 05/20/2022, 4:36 PM   Recent Labs  Lab 05/17/22 0522 05/18/22 0435 05/19/22 0427 05/19/22 1029 05/20/22 0509  HGB 9.9* 10.0*  --  9.0* 9.6*  ALBUMIN 2.2* 2.1*  --   --  1.8*  CALCIUM 8.0* 8.0* 8.0*  --  8.2*  PHOS 5.4* 3.8  --   --   --   CREATININE 3.57* 3.18* 3.04*  --  2.74*  K 4.4 4.2 4.2  --  4.0    No results for input(s): "IRON", "TIBC", "FERRITIN" in the last 168 hours. Inpatient medications:  amLODipine  2.5 mg Oral Daily   atorvastatin  20 mg Oral Daily   calcitRIOL  0.25 mcg Oral Daily   cyanocobalamin  1,000 mcg Oral Daily   docusate sodium  100 mg Oral BID   enoxaparin (LOVENOX) injection  30 mg Subcutaneous Q24H   gabapentin  100 mg Oral QHS   insulin aspart  0-15 Units Subcutaneous TID WC   insulin aspart  0-5 Units Subcutaneous QHS   insulin detemir  10 Units Subcutaneous Daily   lidocaine  3 patch Transdermal Q24H   loratadine  10 mg Oral QHS   metoprolol tartrate  25 mg Oral BID   senna  1 tablet Oral BID   sodium bicarbonate  650 mg Oral BID    sodium chloride     sodium chloride 65 mL/hr at 05/19/22 1558   sodium chloride, acetaminophen,  diphenhydrAMINE, guaiFENesin-dextromethorphan, HYDROcodone-acetaminophen, methocarbamol, prochlorperazine **OR** prochlorperazine **OR** prochlorperazine, sodium phosphate, sorbitol, traMADol, traZODone

## 2022-05-20 NOTE — Plan of Care (Signed)
  Problem: RH Balance Goal: LTG: Patient will maintain dynamic sitting balance (OT) Description: LTG:  Patient will maintain dynamic sitting balance with assistance during activities of daily living (OT) Flowsheets (Taken 05/20/2022 1652) LTG: Pt will maintain dynamic sitting balance during ADLs with: Supervision/Verbal cueing   Problem: Sit to Stand Goal: LTG:  Patient will perform sit to stand in prep for activites of daily living with assistance level (OT) Description: LTG:  Patient will perform sit to stand in prep for activites of daily living with assistance level (OT) Flowsheets (Taken 05/20/2022 1652) LTG: PT will perform sit to stand in prep for activites of daily living with assistance level: Minimal Assistance - Patient > 75%   Problem: RH Grooming Goal: LTG Patient will perform grooming w/assist,cues/equip (OT) Description: LTG: Patient will perform grooming with assist, with/without cues using equipment (OT) Flowsheets (Taken 05/20/2022 1652) LTG: Pt will perform grooming with assistance level of: Set up assist    Problem: RH Bathing Goal: LTG Patient will bathe all body parts with assist levels (OT) Description: LTG: Patient will bathe all body parts with assist levels (OT) Flowsheets (Taken 05/20/2022 1652) LTG: Pt will perform bathing with assistance level/cueing: Minimal Assistance - Patient > 75% LTG: Position pt will perform bathing:  At sink  Edge of bed   Problem: RH Dressing Goal: LTG Patient will perform upper body dressing (OT) Description: LTG Patient will perform upper body dressing with assist, with/without cues (OT). Flowsheets (Taken 05/20/2022 1652) LTG: Pt will perform upper body dressing with assistance level of: Supervision/Verbal cueing Goal: LTG Patient will perform lower body dressing w/assist (OT) Description: LTG: Patient will perform lower body dressing with assist, with/without cues in positioning using equipment (OT) Flowsheets (Taken  05/20/2022 1652) LTG: Pt will perform lower body dressing with assistance level of: Moderate Assistance - Patient 50 - 74%   Problem: RH Toileting Goal: LTG Patient will perform toileting task (3/3 steps) with assistance level (OT) Description: LTG: Patient will perform toileting task (3/3 steps) with assistance level (OT)  Flowsheets (Taken 05/20/2022 1652) LTG: Pt will perform toileting task (3/3 steps) with assistance level: Moderate Assistance - Patient 50 - 74%   Problem: RH Functional Use of Upper Extremity Goal: LTG Patient will use RT/LT upper extremity as a (OT) Description: LTG: Patient will use right/left upper extremity as a stabilizer/gross assist/diminished/nondominant/dominant level with assist, with/without cues during functional activity (OT) Flowsheets (Taken 05/20/2022 1652) LTG: Use of upper extremity in functional activities: RUE as gross assist level LTG: Pt will use upper extremity in functional activity with assistance level of: Supervision/Verbal cueing   Problem: RH Toilet Transfers Goal: LTG Patient will perform toilet transfers w/assist (OT) Description: LTG: Patient will perform toilet transfers with assist, with/without cues using equipment (OT) Flowsheets (Taken 05/20/2022 1652) LTG: Pt will perform toilet transfers with assistance level of: Minimal Assistance - Patient > 75%

## 2022-05-20 NOTE — Progress Notes (Signed)
Patient ID: Miranda Roman, female   DOB: [birth year], 76 y.o.   MRN: [id number]  SW went by room to provide updates from team conference, but pt in OT therapy session. SW will follow-up with pt and family to give updates.   [name], MSW, [place] Office: [phone] Cell: [phone] Fax: [phone]

## 2022-05-20 NOTE — Patient Instructions (Signed)
Complete each exercise 10-15X, 2-3X/day   2) Digit composite flexion/adduction (make a fist) Hold your hand up as shown. Open and close your hand into a fist and repeat. If you cannot make a full fist, then make a partial fist.    3) Thumb/finger opposition Touch the tip of the thumb to each fingertip one by one. Extend fingers fully after they are touched.       7) Digit Abduction/Adduction Hold hand palm down flat on table. Spread your fingers apart and back together.     AROM: Elbow Flexion / Extension   With left hand palm up, gently bend elbow as far as possible. Then straighten arm as far as possible. Repeat __10__ times per set. Do __1__ sets per session. Do _2-3___ sessions per day.  Repeat all exercises 10 times, 2-3 times per day.  1) Shoulder Protraction    Begin with elbows by your side, slowly "punch" straight out in front of you.

## 2022-05-20 NOTE — Progress Notes (Signed)
I have messaged Dr. Royce Macadamia to inquire about Procrit injection. I will follow-up.

## 2022-05-20 NOTE — Care Management (Signed)
[facility] Individual Statement of Services  Patient Name:  KARLIE AUNG  Date:  05/20/2022  Welcome to the [facility].  Our goal is to provide you with an individualized program based on your diagnosis and situation, designed to meet your specific needs.  With this comprehensive rehabilitation program, you will be expected to participate in at least 3 hours of rehabilitation therapies Monday-Friday, with modified therapy programming on the weekends.  Your rehabilitation program will include the following services:  Physical Therapy (PT), Occupational Therapy (OT), 24 hour per day rehabilitation nursing, Therapeutic Recreaction (TR), Psychology, Neuropsychology, Care Coordinator, Rehabilitation Medicine, [place], and Other  Weekly team conferences will be held on Tuesdays to discuss your progress.  Your Inpatient Rehabilitation Care Coordinator will talk with you frequently to get your input and to update you on team discussions.  Team conferences with you and your family in attendance may also be held.  Expected length of stay: 14-18 days    Overall anticipated outcome: Minimal Assistance  Depending on your progress and recovery, your program may change. Your Inpatient Rehabilitation Care Coordinator will coordinate services and will keep you informed of any changes. Your Inpatient Rehabilitation Care Coordinator's name and contact numbers are listed  below.  The following services may also be recommended but are not provided by the [facility] will be made to provide these services after discharge if needed.  Arrangements include referral to agencies that provide these services.  Your insurance has been verified to be:  [facility] [employer]  Your primary doctor is:   Glendale Chard  Pertinent information will be shared with your doctor and your insurance company.  Inpatient Rehabilitation Care Coordinator:  Cathleen Corti [phone] or [phone]  Information discussed with and copy given to patient by: Rana Snare, 05/20/2022, 6:09 PM

## 2022-05-20 NOTE — Progress Notes (Signed)
PROGRESS NOTE   Subjective/Complaints:  Pt reports had a bad AM due to pain- tramadol not working great- however cannot increase dose due to CKD4.  Per daughter, got loopy with  morphine and dilaudid- will try Norco after d/w daughter and pt.  Also, having rib pain due to previous pneumonia - is also very bothersome.   LBM yesterday AM- denies constipation.  Plans on eating, but hasn't eaten tray yet.    Purewick also leaked x2 last night- needed to be cleaned up ROS:  Pt denies SOB, abd pain, CP, N/V/C/D, and vision changes   Objective:   VAS [place] LOWER EXTREMITY VENOUS (DVT)  Result Date: 05/19/2022  Lower Venous DVT Study Patient Name:  Miranda Roman  Date of Exam:   05/19/2022 Medical Rec #: [id number]         Accession #:    [id number] Date of Birth: 1946-05-19        Patient Gender: F Patient Age:   76 years Exam Location:  [facility] Procedure:      VAS [place] LOWER EXTREMITY VENOUS (DVT) Referring Phys: Risa Grill --------------------------------------------------------------------------------  Indications: Swelling.  Risk Factors: Status post ORIF for Tibia/Fibula fracture. Limitations: Bandages, pain with compression. Comparison Study: No prior Performing Technologist: Sharion Dove RVS  Examination Guidelines: A complete evaluation includes B-mode imaging, spectral Doppler, color Doppler, and power Doppler as needed of all accessible portions of each vessel. Bilateral testing is considered an integral part of a complete examination. Limited examinations for reoccurring indications may be performed as noted. The reflux portion of the exam is performed with the patient in reverse Trendelenburg.  +---------+---------------+---------+-----------+----------+-------------------+ RIGHT    CompressibilityPhasicitySpontaneityPropertiesThrombus Aging       +---------+---------------+---------+-----------+----------+-------------------+ CFV      Full           Yes      Yes                                      +---------+---------------+---------+-----------+----------+-------------------+ SFJ      Full                                                             +---------+---------------+---------+-----------+----------+-------------------+ FV Prox  Full                                                             +---------+---------------+---------+-----------+----------+-------------------+ FV Mid   Full                                                             +---------+---------------+---------+-----------+----------+-------------------+  FV DistalFull                                                             +---------+---------------+---------+-----------+----------+-------------------+ PFV      Full                                                             +---------+---------------+---------+-----------+----------+-------------------+ POP                                                   Not well visualized +---------+---------------+---------+-----------+----------+-------------------+ PTV                                                   Not well visualized +---------+---------------+---------+-----------+----------+-------------------+ PERO                                                  Not well visualized +---------+---------------+---------+-----------+----------+-------------------+   +----+---------------+---------+-----------+----------+--------------+ LEFTCompressibilityPhasicitySpontaneityPropertiesThrombus Aging +----+---------------+---------+-----------+----------+--------------+ CFV Full           Yes      Yes                                 +----+---------------+---------+-----------+----------+--------------+     *See table(s) above for measurements and  observations. Electronically signed by Servando Snare MD on 05/19/2022 at 6:03:44 PM.    Final    Recent Labs    05/19/22 1029 05/20/22 0509  WBC 16.4* 15.0*  HGB 9.0* 9.6*  HCT 27.7* 29.6*  PLT 217 268   Recent Labs    05/19/22 0427 05/20/22 0509  NA 146* 145  K 4.2 4.0  CL 118* 118*  CO2 21* 21*  GLUCOSE 82 98  BUN 48* 44*  CREATININE 3.04* 2.74*  CALCIUM 8.0* 8.2*    Intake/Output Summary (Last 24 hours) at 05/20/2022 0916 Last data filed at 05/19/2022 2112 Gross per 24 hour  Intake --  Output 800 ml  Net -800 ml        Physical Exam: Vital Signs Blood pressure (!) 157/76, pulse 100, temperature 98.6 F (37 C), temperature source Oral, resp. rate 17, height _0  (1.626 m), weight 78.9 kg, SpO2 97 %.   General: awake, alert, appropriate, sitting up in bed; RUE cast; daughter at bedside; NAD HENT: conjugate gaze; oropharynx moist CV: borderline tachycardic rate- regular rhythm; no JVD Pulmonary: CTA B/L; no W/R/R- good air movement GI: soft, NT, ND, (+)BS- normoactive Psychiatric: appropriate- but only answers questions- doesn't speak spontaneously-  Neurological: baseline cognitive impairment  Musculoskeletal:     Cervical back: Normal range of motion.     Comments: Right lower leg surgical  dressing in place; right forearm splint in place with ACE wrap. RUE tender to minimal palpation and movement.  Can wiggle toes on RLE Skin:    General: Skin is warm.  Neurological:     Mental Status: She is alert and oriented to person, place, and time.     Comments: Underlying cognitive impairment. Oriented to person, place, reason she's here. Fair insight and awareness. Normal speech, language. CN exam unremarkable. LUE 4/5. RUE limited by pain/ortho. Does move all muscle groups. LLE 2/5 prox to 4/5 distal. RLE tr-1/5 prox to 3/5 distal (can wiggle toes within splint). No focal sensory loss.     Assessment/Plan: 1. Functional deficits which require 3+ hours per day of  interdisciplinary therapy in a comprehensive inpatient rehab setting. Physiatrist is providing close team supervision and 24 hour management of active medical problems listed below. Physiatrist and rehab team continue to assess barriers to discharge/monitor patient progress toward functional and medical goals  Care Tool:  Bathing              Bathing assist       Upper Body Dressing/Undressing Upper body dressing        Upper body assist      Lower Body Dressing/Undressing Lower body dressing            Lower body assist       Toileting Toileting    Toileting assist       Transfers Chair/bed transfer  Transfers assist           Locomotion Ambulation   Ambulation assist              Walk 10 feet activity   Assist           Walk 50 feet activity   Assist           Walk 150 feet activity   Assist           Walk 10 feet on uneven surface  activity   Assist           Wheelchair     Assist               Wheelchair 50 feet with 2 turns activity    Assist            Wheelchair 150 feet activity     Assist           Medical Problem List and Plan: 1. Functional deficits secondary to multiple trauma after ground level fall             -patient may not yet shower             -ELOS/Goals: 12-14 days, min assist to supervision goals  Admit to CIR- firs day of evaluations- Con't PT, OT   Team conference today to determine length of stay 2.  Antithrombotics: -DVT/anticoagulation:  Pharmaceutical: Lovenox 30 mg daily due to renal impairment -venous dopplers appear normal (where able to test given RLE splint)             -antiplatelet therapy: none 3. Pain Management: Tylenol, tramadol, Robaxin prn  11/14- will add Norco 5/325 mg q6 hours prn for pain and monitor cognition- cannot increase tramadol due to renal impairment- will also add Lidoderm patches 3 patches 12 hrs on; 12 hrs off-  8pm-8am 4. Mood/Behavior/Sleep: LCSW to evaluate and provide emotional support             -antipsychotic agents: n/a  5. Neuropsych/cognition: This patient is not capable of making decisions on her own behalf. Consider SLP eval. 6. Skin/Wound Care: Routine skin care checks             -monitor surgical incision 7. Fluids/Electrolytes/Nutrition: Routine Is and Os and follow-up chemistries             -ensure adequate PO hydration 8: Right tib-fib fracture s/p ORIF 11/2 -NWB RLE  -follow-up with Dr. Mardelle Matte 9: Right non-displaced wrist fracture: WBAT tolerated through elbow             -maintain wrist splint             -follow-up with Dr. Greta Doom 10: Pneumonia: continue ceftriaxone and azithromycin             -cough/deep breath/IS/FV 11: Hypertension: monitor TID and prn 12: CKD 4: continue calcitrol and sodium bicarb 13: Hyperlipidemia: continue Lipitor 14: DM2: CBGs 4 times daily             -continue Levemir 10 units daily             -continue sliding scale             -consider restarting home insulin 4 units with meals  11/14- will monitor trend- is 91-223 in last 24 hours, but give 1 more day before adding insulin 15: Constipation: continue senna and Colace daily; prns 16: Anemia; multi-factorial: follow-up CBC 17: Leukocytosis/low grade fever: abx for pneumonia continue  11/14- WBC down to 15k from 16.4- on Rocephin 18: AKI: in setting of CKD4 serum creatinine improving, still elevated (baseline 2.3-2.6)             -follow-up on BMP  11/14- Cr down to 2.74 form 3.04- so heading in right direction    I spent a total of 40    minutes on total care today- >50% coordination of care- due to team conference and prolonged d/w daughter about plans for pain control and CKD4.      LOS: 1 days A FACE TO FACE EVALUATION WAS PERFORMED  Miranda Roman 05/20/2022, 9:16 AM

## 2022-05-20 NOTE — Evaluation (Signed)
Occupational Therapy Assessment and Plan  Patient Details  Name: Miranda Roman MRN: [id number] Date of Birth: [birth year]  OT Diagnosis: acute pain, muscle weakness (generalized), and pain in joint Rehab Potential: Rehab Potential (ACUTE ONLY): Excellent ELOS: 2-2.5 weeks   Today's Date: 05/20/2022 OT Individual Time: 2297-9892 OT Individual Time Calculation (min): 83 min     Hospital Problem: Principal Problem:   Critical polytrauma Active Problems:   Right tibial fracture   Past Medical History:  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   Borderline steroid-induced glaucoma, 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   OD, Early elevated intraocular pressure to 28 mm we will discontinue all topical medications from surgery in the right eye today   Bronchiectasis without complication 11/94/1740   Cataract 07/03/2020   Chest pain at rest 01/30/2015   Chronic kidney disease, stage 4 (severe) 12/07/2020   Closed fracture of right tibial plateau 07/17/2021   Cyst of left kidney 03/10/2019   Needs repeated US 08/2019   Dysphagia, neurologic    Elevated d-dimer 12/20/2019   Facet arthritis of lumbar region 02/27/2019   Gastroesophageal reflux disease 08/26/2016   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   Hyperglycemia due to type 2 diabetes mellitus 12/07/2020   Hyperlipidemia    Hypertensive nephropathy 10/15/2017   Inadequate oral nutritional intake     Leukocytosis 12/22/2019   Long term (current) use of insulin 12/07/2020   Low grade squamous intraepithelial lesion (LGSIL) on cervicovaginal cytologic smear 12/15/2018   Lumbago 02/27/2019   Macular atrophy, 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  Further history patient suffered possible Wernicke's encephalopathy the past, a type of the vitamin deficiencies, thiamine that can also affect the retina if there is significant damage which may not be recoverable.   Mild neurocognitive disorder due to multiple etiologies 01/23/2022   Osteoporosis 12/15/2018   Overweight with body mass index (BMI) 25.0-29.9 12/15/2018   PAD (peripheral artery disease) 06/06/2019   Pain in right hand 04/27/2020   Pancreatic cyst 06/06/2019   Polyneuropathy due to type 2 diabetes mellitus    Posterior vitreous detachment of left eye 08/05/2021   Proliferative diabetic retinopathy of right eye 08/22/2021   Likely contributory cause of nonclearing vitreous hemorrhage in the right eye now status post PRP likely to resolve in 2 quiescent PDR, post vitrectomy PRP   Pulmonary hypertension 02/04/2021   Right knee pain 07/10/2021   Sickle-cell trait 12/07/2020   Type II diabetes mellitus 03/10/2012   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   Vitreous hemorrhage of right eye 08/05/2021   Vitrectomy PRP 08-14-2021   Past Surgical History:  Past Surgical History:  Procedure Laterality Date   BONE RESECTION  01/2006   wrist   GASTRIC BYPASS  08/14/2009   OPEN REDUCTION INTERNAL FIXATION (ORIF) TIBIA/FIBULA FRACTURE Right 05/10/2022   Procedure: OPEN REDUCTION INTERNAL FIXATION (ORIF) TIBIA/FIBULA FRACTURE;  Surgeon: Marchia Bond,  MD;  Location: [facility];  Service: Orthopedics;  Laterality: Right;   OTHER SURGICAL HISTORY     sickle cell retinopathy laser surgery   TUBAL LIGATION  04/28/1977    Assessment & Plan Clinical  Impression:Miranda Roman is a right-handed 76 year old female brought to the emergency department via EMS from home for mechanical fall on 05/08/2022.[name]  She had physical deformity to the right lower extremity and right wrist.  She had recently moved in with her daughter and typically ambulates with a rolling walker.  She tripped over the family dogs and was complaining of right wrist and right proximal lower leg pain.  Medical history is significant for diabetes mellitus, hypertension and chronic kidney disease stage IV.  Initial imaging revealed no spinal deformity.  Positive for nondisplaced right distal ulnar/radius fracture.  Dr. Greta Doom consulted and fracture is nonoperable and patient placed in splint.  She was admitted and orthopedic surgery consultation obtained.  Imaging revealed right tib-fib fracture and she was placed into a long-leg splint.  On 11/4, she was taken to the operating room by Dr. Mardelle Matte and underwent ORIF with IM nail fixation of right tib-fib fracture and closed management of right distal radius and ulnar fracture.  She developed acute kidney injury with serum creatinine elevated to 3.5.  IVFs continued. Nephrology was consulted on 11/11.  She developed fever with worsening leukocytosis on 11/9.  Urine culture and chest x-ray ordered.  She complained of abdominal pain 11/10 and CT of abdomen pelvis performed. No acute abnormality. Fever worsened to 103.1 on 11/10 and she was tachycardic with increased respiratory rate and desaturating.  Blood cultures obtained.  Empirically started on IV Rocephin and IV azithromycin.  She is nonweightbearing in the right lower extremity and weightbearing as tolerated to the elbow of the right upper extremity. Patient transferred to CIR on 05/19/2022 .    Patient currently requires total with basic self-care skills secondary to muscle weakness, decreased attention, decreased problem solving, decreased safety awareness, and decreased memory, and  decreased sitting balance, decreased standing balance, decreased postural control, decreased balance strategies, and difficulty maintaining precautions.  Prior to hospitalization, patient could complete BADL tasks with  SBA to [place] .  Patient will benefit from skilled intervention to decrease level of assist with basic self-care skills prior to discharge  home with family and HHA assisting .  Anticipate patient will require intermittent supervision and follow up home health.  OT - End of Session Activity Tolerance: Decreased this session Endurance Deficit: Yes Endurance Deficit Description: Pt fatigues quickly with any activity OT Assessment Rehab Potential (ACUTE ONLY): Excellent OT Barriers to Discharge: Weight bearing restrictions OT Patient demonstrates impairments in the following area(s): Balance;Safety;Pain;Endurance;Motor;Edema OT Basic ADL's Functional Problem(s): Grooming;Bathing;Dressing;Toileting OT Transfers Functional Problem(s): Toilet OT Additional Impairment(s): Fuctional Use of Upper Extremity OT Plan OT Intensity: Minimum of 1-2 x/day, 45 to 90 minutes OT Frequency: 5 out of 7 days OT Duration/Estimated Length of Stay: 2-2.5 weeks OT Treatment/Interventions: Balance/vestibular training;Community reintegration;DME/adaptive equipment instruction;Neuromuscular re-education;Functional mobility training;Patient/family education;Discharge planning;Functional electrical stimulation;Pain management;Self Care/advanced ADL retraining;UE/LE Strength taining/ROM;Splinting/orthotics;Wheelchair propulsion/positioning;Therapeutic Exercise;Therapeutic Activities;UE/LE Coordination activities OT Self Feeding Anticipated Outcome(s): Set-up OT Basic Self-Care Anticipated Outcome(s): Min-mod A OT Toileting Anticipated Outcome(s): Mod A OT Bathroom Transfers Anticipated Outcome(s): Mod A OT Recommendation Recommendations for Other Services: Therapeutic Recreation consult Therapeutic  Recreation Interventions: Pet therapy Patient destination: Home Follow Up Recommendations: Home health OT;24 hour supervision/assistance Equipment Recommended: None recommended by OT   OT Evaluation Precautions/Restrictions  Precautions Precautions: Fall  Required Braces or Orthoses: Splint/Cast Splint/Cast: RUE and RLE Restrictions Weight Bearing Restrictions: Yes RUE Weight Bearing: Weight bear through elbow only RLE Weight Bearing: Non weight bearing Pain Pain Assessment Pain Scale: 0-10 Pain Score: 5  Pain Type: Acute pain Pain Location: Foot Pain Orientation: Right Pain Descriptors / Indicators: Aching Pain Onset: On-going Pain Intervention(s): Other (Comment) (pain meds given this AM) Home Living/Prior [place] expects to be discharged to:: Unsure Living Arrangements: Children Available Help at Discharge: Family, Other (Comment), Personal care attendant (Daughter, Son-in-law, 2 sons) Type of Home: House Home Access: Stairs to enter Technical brewer of Steps: 3 Entrance Stairs-Rails: None Home Layout: Two level, 1/2 bath on main level, Able to live on main level with bedroom/bathroom Alternate Level Stairs-Number of Steps: flight Bathroom Shower/Tub: Tub/shower unit, Architectural technologist: Handicapped height Bathroom Accessibility: Yes Additional Comments: Has a shower chair with back, extended tub bench, BSC, hurricane cane, RW, and rolator, manual wheelchair, Home health aide assists M-F 10-12. Able to complete UB bathing and dressing with SBA, LB bathing and dressing Min A. Completed functional mobility at baseline with RW or rolator at Mod I.  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 Has had a history of cataracts removal.) Ability to See in Adequate Light: 1 Impaired Patient Visual Report: Blurring of vision Vision Assessment?: Yes Eye Alignment: Within Functional Limits Ocular Range of Motion: Within Functional Limits Visual Fields: No apparent deficits Perception  Perception: Within Functional Limits Praxis Praxis: Intact Cognition Cognition Overall Cognitive Status: History of cognitive impairments - at baseline Arousal/Alertness: Awake/alert Orientation Level: Person;Place;Situation Memory: Impaired Memory Impairment: Decreased short term memory Awareness: Impaired Problem Solving: Impaired Safety/Judgment: Impaired Brief Interview for Mental Status (BIMS) Repetition of Three Words (First Attempt): 3 Temporal Orientation: Year: Correct Temporal Orientation: Month: Accurate within 5 days Temporal Orientation: Day: Incorrect Recall: "Sock": Yes, no cue required Recall: "Blue": Yes, no cue required Recall: "Bed": Yes, no cue required BIMS Summary Score: 14 Sensation Sensation Light Touch: Appears Intact Hot/Cold: Appears Intact Proprioception: Impaired Detail Proprioception Impaired Details: Impaired RUE (due to fracture) Stereognosis: Impaired Detail Stereognosis Impaired Details: Impaired RUE Additional Comments: Impaired due to recent fracture Coordination Gross Motor Movements are Fluid and Coordinated: No Fine Motor Movements are Fluid and Coordinated: No Coordination and Movement Description: Due to WB status and recent fractures pt presents with impaired coordination Finger Nose Finger Test: Slow and delayed BUE Motor  Motor Motor: Other (comment) Motor - Skilled Clinical Observations: delayed  Trunk/Postural Assessment  Cervical Assessment Cervical Assessment: Exceptions to [facility] (forward head) Thoracic Assessment Thoracic  Assessment: Exceptions to [facility] (rounded shoulders) Lumbar Assessment Lumbar Assessment: Exceptions to [facility] (posterior pelvic tilt) Postural Control Postural Control: Deficits on evaluation (delayed and insufficient)  Balance Balance Balance Assessed: No (See PT evaluation)  Extremity/Trunk Assessment RUE Assessment RUE Assessment: Exceptions to [facility] Passive Range of Motion (PROM) Comments: Functional shoulder and elbow P/ROM in all ranges. Wrist: N/A due to UE cast and precautions Active Range of Motion (AROM) Comments: Functional shoulder and elbow P/ROM in all ranges. Wrist: N/A due to UE cast and precautions. Limited finger flexion. Able to form a closed fist 25% General Strength Comments: Generalized weakness in shoulder and elbow in all ranges. LUE Assessment LUE  Assessment: Exceptions to [facility] General Strength Comments: Gneralized weakness  Care Tool Care Tool Self Care Eating   Eating Assist Level: Minimal Assistance - Patient > 75%    Oral Care    Oral Care Assist Level: Moderate Assistance - Patient 50 - 74%    Bathing   Body parts bathed by patient: Face Body parts bathed by helper: Right arm;Left arm;Chest;Abdomen;Front perineal area;Buttocks;Right upper leg;Left upper leg;Left lower leg Body parts n/a: Right lower leg Assist Level: Total Assistance - Patient < 25%    Upper Body Dressing(including orthotics)   What is the patient wearing?: Hospital gown only   Assist Level: Dependent - Patient 0%    Lower Body Dressing (excluding footwear)   What is the patient wearing?: Incontinence brief Assist for lower body dressing: Dependent - Patient 0%    Putting on/Taking off footwear   What is the patient wearing?: Non-skid slipper socks Assist for footwear: Dependent - Patient 0%       Care Tool Toileting Toileting activity   Assist for toileting: 2 Helpers     Care Tool Bed Mobility Roll left and right activity   Roll left and right assist level: Total  Assistance - Patient < 25%           Care Tool Transfers Sit to stand transfer Sit to stand activity did not occur: Safety/medical concerns Sit to stand assist level: 2 Helpers    Chair/bed transfer Chair/bed transfer activity did not occur: Safety/medical concerns Chair/bed transfer assist level: Total Assistance - Patient < 25%     Toilet transfer Toilet transfer activity did not occur: Safety/medical concerns       Care Tool Cognition  Expression of Ideas and Wants Expression of Ideas and Wants: 4. Without difficulty (complex and basic) - expresses complex messages without difficulty and with speech that is clear and easy to understand  Understanding Verbal and Non-Verbal Content Understanding Verbal and Non-Verbal Content: 3. Usually understands - understands most conversations, but misses some part/intent of message. Requires cues at times to understand   Memory/Recall Ability Memory/Recall Ability : Current season;That he or she is in a hospital/hospital unit   Refer to Care Plan for [place] [facility] OT Short Term Goal 1 (Week 1): Pt will completed UB dressing and bathing with Max A while seated on EOB. OT Short Term Goal 2 (Week 1): Pt will complete functional transfer from bed to [facility] with Max Ax1 utilizing lateral scoot technique and/or with LRAD. OT Short Term Goal 3 (Week 1): Pt will complete LB bathing task with Max A at bed level.  Recommendations for other services: Therapeutic Recreation  Pet therapy   Skilled Therapeutic Intervention ADL ADL Eating: Minimal assistance Where Assessed-Eating: Bed level Grooming: Moderate assistance Where Assessed-Grooming: Bed level Upper Body Bathing: Maximal assistance Where Assessed-Upper Body Bathing: Bed level Lower Body Bathing: Dependent Where Assessed-Lower Body Bathing: Bed level Upper Body Dressing: Maximal assistance Where Assessed-Upper Body Dressing: Bed level Lower Body Dressing:  Dependent Where Assessed-Lower Body Dressing: Bed level Toileting: Dependent Where Assessed-Toileting: Bed level Toilet Transfer: Not assessed Toilet Transfer Method: Not assessed Tub/Shower Transfer: Not assessed Tub/Shower Transfer Method: Unable to assess Mobility  Bed Mobility Bed Mobility: Rolling Right;Rolling Left Rolling Right: Maximal Assistance - Patient 25-49% Rolling Left: Total Assistance - Patient < 25%;2 Helpers Transfers Sit to Stand: 2 Helpers   Discharge Criteria: Patient will be discharged from OT if patient refuses treatment 3 consecutive times without medical  reason, if treatment goals not met, if there is a change in medical status, if patient makes no progress towards goals or if patient is discharged from hospital.  The above assessment, treatment plan, treatment alternatives and goals were discussed and mutually agreed upon: by patient  Ailene Ravel, OTR/L,CBIS  Supplemental OT - [facility] and [facility]  05/20/2022, 12:58 PM

## 2022-05-20 NOTE — Progress Notes (Signed)
Physical Therapy Session Note  Patient Details  Name: Miranda Roman MRN: [id number] Date of Birth: 1946/01/02  Today's Date: 05/20/2022 PT Individual Time: 1300-1410 PT Individual Time Calculation (min): 70 min   Short Term Goals: Week 1:  PT Short Term Goal 1 (Week 1): Pt will perform STS +1 assist PT Short Term Goal 2 (Week 1): Pt will tolerate sitting OOB >1 hour between sessions PT Short Term Goal 3 (Week 1): Pt will perform least restrictive transfer with max A  Skilled Therapeutic Interventions/Progress Updates:  pt received in bed and agreeable to therapy. Pt reports pain improved from previous session, but cries out with mobility, premedicated. Rest and positioning provided as needed. Pt required increased time for initiation and processing throughout session. Rolling to don pants max-tot A. Supine>sit with max A x 2 for trunk elevation after assisting to move BLE toward EOB with assist for RLE. Pt able to sit EOB with supervision but requires max A to scoot to EOB. Pt instructed on and performed slideboard transfer with max A x2. Pt able to assist with w/c propulsion with LLE to begin working on coordination for hemi propulsion. Attempted Sit to stand from w/c with tot A +2, with PFRW and RLE propped to maintain weight bearing status.  Pt unable to achieve full standing position at this time, with hip and trunk flexed. Pt then participated in seated AROM of R hip and knee for improved strength and pain reduction. Pt returned to room at end of session and remained in w/c, was left with all needs in reach and alarm active.   Therapy Documentation Precautions:  Precautions Precautions: Fall Precaution Comments: R knee pain - hold leg up Required Braces or Orthoses: Splint/Cast Splint/Cast: RUE and RLE Restrictions Weight Bearing Restrictions: Yes RUE Weight Bearing: Weight bear through elbow only RLE Weight Bearing: Non weight bearing Other Position/Activity Restrictions: Can  bear weight through right elbow, not wrist General:       Therapy/Group: Individual Therapy  Mickel Fuchs 05/20/2022, 3:05 PM

## 2022-05-21 LAB — CULTURE, BLOOD (ROUTINE X 2)
Culture: NO GROWTH
Culture: NO GROWTH
Special Requests: ADEQUATE
Special Requests: ADEQUATE

## 2022-05-21 LAB — GLUCOSE, CAPILLARY
Glucose-Capillary: 97 mg/dL (ref 70–99)
Glucose-Capillary: 117 mg/dL — ABNORMAL HIGH (ref 70–99)
Glucose-Capillary: 115 mg/dL — ABNORMAL HIGH (ref 70–99)
Glucose-Capillary: 190 mg/dL — ABNORMAL HIGH (ref 70–99)

## 2022-05-21 MED ORDER — ADULT MULTIVITAMIN W/MINERALS CH
1.0000 | ORAL_TABLET | Freq: Every day | ORAL | Status: DC
  Administered 2022-05-21 – 2022-06-06 (×17): 1 via ORAL
  Filled 2022-05-21 (×17): qty 1

## 2022-05-21 MED ORDER — AZO CRANBERRY 250-30 MG PO TABS
1.0000 | ORAL_TABLET | Freq: Every day | ORAL | Status: DC
  Administered 2022-05-23 – 2022-06-06 (×14): 1 via ORAL
  Filled 2022-05-21 (×12): qty 1

## 2022-05-21 MED ORDER — NON FORMULARY: 1.0000 | Freq: Every day | Status: DC

## 2022-05-21 MED ORDER — HYDROCODONE-ACETAMINOPHEN 5-325 MG PO TABS
1.0000 | ORAL_TABLET | ORAL | Status: DC | PRN
  Administered 2022-05-21 – 2022-06-06 (×7): 1 via ORAL
  Filled 2022-05-21 (×7): qty 1

## 2022-05-21 MED ORDER — TRAMADOL-ACETAMINOPHEN 37.5-325 MG PO TABS
1.0000 | ORAL_TABLET | Freq: Three times a day (TID) | ORAL | Status: DC
  Administered 2022-05-21 – 2022-05-26 (×19): 1 via ORAL
  Filled 2022-05-21 (×19): qty 1

## 2022-05-21 MED ORDER — LIDOCAINE HCL URETHRAL/MUCOSAL 2 % EX GEL: 1.0000 | CUTANEOUS | Status: DC | PRN

## 2022-05-21 MED ORDER — ACETAMINOPHEN 325 MG PO TABS: 650.0000 mg | ORAL_TABLET | Freq: Three times a day (TID) | ORAL | Status: DC

## 2022-05-21 MED ORDER — FOLIC ACID 1 MG PO TABS
1.0000 mg | ORAL_TABLET | Freq: Every day | ORAL | Status: DC
  Administered 2022-05-21 – 2022-06-06 (×17): 1 mg via ORAL
  Filled 2022-05-21 (×17): qty 1

## 2022-05-21 NOTE — Progress Notes (Deleted)
Messaged nephrologist on call, Dr. Carolin Sicks regarding Procrit.

## 2022-05-21 NOTE — Progress Notes (Signed)
RN reports urinary frequency. Patient remains on IVFs 65 cc/hr. Will check PVRs and in and out cath if greater than or equal to 350 cc. Looks as though Azo cranberry was ordered this afternoon with note stating daughter will bring tomorrow.

## 2022-05-21 NOTE — Progress Notes (Signed)
Physical Therapy Session Note  Patient Details  Name: Miranda Roman MRN: [id number] Date of Birth: 04/17/1946  Today's Date: 05/21/2022 PT Individual Time: 1415-1515 PT Individual Time Calculation (min): 60 min   Short Term Goals: Week 1:  PT Short Term Goal 1 (Week 1): Pt will perform STS +1 assist PT Short Term Goal 2 (Week 1): Pt will tolerate sitting OOB >1 hour between sessions PT Short Term Goal 3 (Week 1): Pt will perform least restrictive transfer with max A  Skilled Therapeutic Interventions/Progress Updates: Pt presented in bed agreeable to therapy. Pt states pain 2/10 at rest, increased with mobility with at times pt yelling but did not rate. Rest breaks and informing pt of mobility throughout session provided for pain management. PTA discussed primary focus of session to work on ROM, pain management, and sitting EOB. Pt performed following supine therex however required increased cues and increased time to perform all exercises. Performed LLE ankle pumps, pt able to wiggle toes on R. Performed B quad sets and glute sets x 10, heel slides (AA on RLE WLP) x 10, hip abd (AA on R) x 10, and AA SAQ RLE only. Increased HOB and pt performed supine to sit with maxA z 2 and significantly increased time. Pt was able to mange LLE and was able to move RLE partially off bed with assist and increased time. Pt was able to tolerate sitting EOB ~8 min with sitting balance varying between CGA and minA. Pt would intermittent demonstrate posterior lean but only required assist to return to neutral x1. Performed AA LAQ x 5 to RLE for increased ROM. Pt required maxA x 2 to return to supine and maxA x 2 to scoot to [facility]. Pt with poor tolerance on first boost with pt yelling, but PTA had pt attempt to assist with LLE by bending knee and instructing pt to "push into the bed" with pt demonstrating improved tolerance. Pt noted to be incontinent of bladder. Performed rolling maxA x 2 with pt dependent for peri-care  and to change brief. Pt left in bed with nsg present for IV change and vitals check.      Therapy Documentation Precautions:  Precautions Precautions: Fall Precaution Comments: R knee pain - hold leg up Required Braces or Orthoses: Splint/Cast Splint/Cast: RUE and RLE Restrictions Weight Bearing Restrictions: Yes RUE Weight Bearing: Weight bear through elbow only RLE Weight Bearing: Non weight bearing Other Position/Activity Restrictions: Can bear weight through right elbow, not wrist General: PT Amount of Missed Time (min): 15 Minutes PT Missed Treatment Reason: Nursing care Vital Signs:  Pain: Pain Assessment Pain Scale: 0-10 Pain Score: 4  Pain Type: Acute pain Pain Location: Arm Pain Orientation: Right Pain Descriptors / Indicators: Aching Pain Frequency: Constant Pain Intervention(s): Medication (See eMAR) Mobility:   Locomotion :    Trunk/Postural Assessment :    Balance:   Exercises:   Other Treatments:      Therapy/Group: Individual Therapy  Nakea Gouger 05/21/2022, 3:28 PM

## 2022-05-21 NOTE — Plan of Care (Signed)
  Problem: RH Expression Communication Goal: LTG Patient will verbally express basic/complex needs(SLP) Description: LTG:  Patient will verbally express basic/complex needs, wants or ideas with cues  (SLP) Flowsheets (Taken 05/21/2022 1501) LTG: Patient will verbally express basic/complex needs, wants or ideas (SLP): Minimal Assistance - Patient > 75% Goal: LTG Patient will increase word finding of common (SLP) Description: LTG:  Patient will increase word finding of common objects/daily info/abstract thoughts with cues using compensatory strategies (SLP). Flowsheets (Taken 05/21/2022 1501) LTG: Patient will increase word finding of common (SLP): Minimal Assistance - Patient > 75%   Problem: RH Attention Goal: LTG Patient will demonstrate this level of attention during functional activites (SLP) Description: LTG:  Patient will will demonstrate this level of attention during functional activites (SLP) Flowsheets (Taken 05/21/2022 1501) Patient will demonstrate during cognitive/linguistic activities the attention type of: Sustained LTG: Patient will demonstrate this level of attention during cognitive/linguistic activities with assistance of (SLP): Supervision

## 2022-05-21 NOTE — Evaluation (Signed)
Speech Language Pathology Assessment and Plan  Patient Details  Name: Miranda Roman MRN: [id number] Date of Birth: May 15, 1946  SLP Diagnosis: Speech and Language deficits;Cognitive Impairments  Rehab Potential: Good ELOS: 12-14 days    Today's Date: 05/21/2022 SLP Individual Time: 5041-3643 SLP Individual Time Calculation (min): 91 min   Hospital Problem: Principal Problem:   Critical polytrauma Active Problems:   Right tibial fracture  Past Medical History:  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   Borderline steroid-induced glaucoma, 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   OD, Early elevated intraocular pressure to 28 mm we will discontinue all topical medications from surgery in the right eye today   Bronchiectasis without complication 83/77/9396   Cataract 07/03/2020   Chest pain at rest 01/30/2015   Chronic kidney disease, stage 4 (severe) 12/07/2020   Closed fracture of right tibial plateau 07/17/2021   Cyst of left kidney 03/10/2019   Needs repeated US 08/2019   Dysphagia, neurologic    Elevated d-dimer 12/20/2019   Facet arthritis of lumbar region 02/27/2019   Gastroesophageal reflux disease 08/26/2016   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   Hyperglycemia due to type 2 diabetes mellitus 12/07/2020   Hyperlipidemia    Hypertensive nephropathy 10/15/2017   Inadequate oral nutritional intake    Leukocytosis 12/22/2019   Long term  (current) use of insulin 12/07/2020   Low grade squamous intraepithelial lesion (LGSIL) on cervicovaginal cytologic smear 12/15/2018   Lumbago 02/27/2019   Macular atrophy, 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  Further history patient suffered possible Wernicke's encephalopathy the past, a type of the vitamin deficiencies, thiamine that can also affect the retina if there is significant damage which may not be recoverable.   Mild neurocognitive disorder due to multiple etiologies 01/23/2022   Osteoporosis 12/15/2018   Overweight with body mass index (BMI) 25.0-29.9 12/15/2018   PAD (peripheral artery disease) 06/06/2019   Pain in right hand 04/27/2020   Pancreatic cyst 06/06/2019   Polyneuropathy due to type 2 diabetes mellitus    Posterior vitreous detachment of left eye 08/05/2021   Proliferative diabetic retinopathy of right eye 08/22/2021   Likely contributory cause of nonclearing vitreous hemorrhage in the right eye now status post PRP likely to resolve in 2 quiescent PDR, post vitrectomy PRP   Pulmonary hypertension 02/04/2021   Right knee pain 07/10/2021   Sickle-cell trait 12/07/2020   Type II diabetes mellitus 03/10/2012   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   Vitreous hemorrhage of right eye 08/05/2021   Vitrectomy PRP 08-14-2021   Past Surgical History:  Past Surgical History:  Procedure Laterality Date   BONE RESECTION  01/2006   wrist   GASTRIC BYPASS  08/14/2009   OPEN REDUCTION INTERNAL FIXATION (ORIF) TIBIA/FIBULA FRACTURE Right 05/10/2022   Procedure: OPEN REDUCTION INTERNAL FIXATION (ORIF) TIBIA/FIBULA FRACTURE;  Surgeon: Marchia Bond, MD;  Location: [facility];  Service: Orthopedics;  Laterality: Right;   OTHER SURGICAL HISTORY     sickle cell retinopathy laser surgery   TUBAL LIGATION  04/28/1977    Assessment / Plan / Recommendation Clinical Impression Patient  is a right-handed 76 year old female brought to the emergency department via EMS from home for mechanical fall on 05/08/2022. She had physical deformity to the right lower extremity and right wrist.  She had recently moved in with her daughter and typically ambulates with a rolling walker.  She tripped over the family dogs and was complaining of right wrist and right proximal lower leg pain.  Medical history is significant for diabetes mellitus, hypertension and chronic kidney disease stage IV.  Initial imaging revealed no spinal deformity.  Positive for nondisplaced right distal ulnar/radius fracture.  Dr. Greta Doom consulted and fracture is nonoperable and patient placed in splint.  She was admitted and orthopedic surgery consultation obtained.  Imaging revealed right tib-fib fracture and she was placed into a long-leg splint.  On 11/4, she was taken to the operating room by Dr. Mardelle Matte and underwent ORIF with IM nail fixation of right tib-fib fracture and closed management of right distal radius and ulnar fracture.  She developed acute kidney injury with serum creatinine elevated to 3.5.  IVFs continued. Nephrology was consulted on 11/11.  She developed fever with worsening leukocytosis on 11/9.  Urine culture and chest x-ray ordered.  She complained of abdominal pain 11/10 and CT of abdomen pelvis performed. No acute abnormality. Fever worsened to 103.1 on 11/10 and she was tachycardic with increased respiratory rate and desaturating.  Blood cultures obtained.  Empirically started on IV Rocephin and IV azithromycin.  She is nonweightbearing in the right lower extremity and weightbearing as tolerated to the elbow of the right upper extremity. Patient transferred to CIR on 05/19/2022 .     Upon arrival, patient was awake while upright in bed with her daughter present.   Patient's oral-motor function and strength were WFL. Patient consumed a snack of regular textures (grapes) and thin liquids via straw without overt  s/s of aspiration. Patient demonstrated efficient mastication with what appeared to be appropriate AP transit with a swift swallow response with both solids and liquids. Both the patient and her daughter report no evidence of swallowing difficulty throughout meals. Recommend patient continue current diet of regular textures with thin liquids.   Patient's daughter reports cognitive deficits at baseline with a home health aid/family assisting with basic and iADLs. SLP provided education regarding the importance of patient participating in decision making as well as basic and familiar tasks at home as much as possible to maximize cognitive stimulation. Both verbalized understanding but also reported the patient's function is limited at times due to visual deficits.  SLP administered an informal assessment as well as a variety of subtests from formal assessments. Patient was oriented X 4 and performed all structured language tasks with 100% accuracy. However, during informal conversation, patient demonstrated delayed processing, deficits in short-term recall, decreased thought organization, decreased sustained attention (appeared internally distracted by pain/discomfort) and impaired word-finding. Patient's daughter reports these deficits are baseline but appear somewhat mildly exacerbated with this admission.   SLP provided education regarding recommendations for SLP services to maximize her cognitive-linguistic function as well as provided treatment options per the family's request such as 1-3X/week, 5-7X/week, or f/u after discharge. Patent's family feels that the patient's biggest barrier to her overall functional independence are her current physical deficits. Therefore, they would like to maximize the patient's time with PT/OT and  the patient's daughter requested to speak to the family prior to agreeing to SLP services while on CIR. SLP verbalized understanding and will f/u with the patient and her family  tomorrow regarding their decision.     Skilled Therapeutic Interventions          Administered a cognitive-linguistic evaluation and BSE, please see above for details   SLP Assessment  Patient will need skilled Speech Lanaguage Pathology Services during CIR admission    Recommendations  SLP Diet Recommendations: Age appropriate regular solids;Thin Liquid Administration via: Cup;Straw Medication Administration: Whole meds with liquid Supervision: Patient able to self feed;Staff to assist with self feeding Compensations: Minimize environmental distractions;Slow rate;Small sips/bites Postural Changes and/or Swallow Maneuvers: Seated upright 90 degrees Oral Care Recommendations: Oral care BID Recommendations for Other Services: Neuropsych consult Patient destination: Home Follow up Recommendations: Outpatient SLP;24 hour supervision/assistance Equipment Recommended: None recommended by SLP    SLP Frequency 1 to 3 out of 7 days   SLP Duration  SLP Intensity  SLP Treatment/Interventions 12-14 days  Minumum of 1-2 x/day, 30 to 90 minutes  Cognitive remediation/compensation;Speech/Language facilitation;Therapeutic Activities;Environmental controls;Cueing hierarchy;Functional tasks;Patient/family education    Pain Pain Assessment Pain Scale: 0-10 Pain Score: 4  Pain Type: Acute pain Pain Location: Arm Pain Orientation: Right Pain Descriptors / Indicators: Aching Pain Frequency: Constant Pain Intervention(s): Medication (See eMAR)  SLP Evaluation Cognition Overall Cognitive Status: History of cognitive impairments - at baseline Arousal/Alertness: Awake/alert Orientation Level: Oriented X4 Focused Attention: Appears intact Sustained Attention: Impaired Sustained Attention Impairment: Verbal basic;Functional basic Memory: Impaired Memory Impairment: Decreased short term memory Decreased Short Term Memory: Verbal basic Awareness: Impaired Awareness Impairment: Emergent  impairment Safety/Judgment: Impaired  Comprehension Auditory Comprehension Overall Auditory Comprehension: Appears within functional limits for tasks assessed Expression Expression Primary Mode of Expression: Verbal Verbal Expression Overall Verbal Expression: Impaired Initiation: No impairment Automatic Speech: Name;Social Response Level of Generative/Spontaneous Verbalization: Phrase;Sentence Repetition: No impairment Naming: No impairment Pragmatics: No impairment Interfering Components: Attention Other Verbal Expression Comments: Decreased thought organization and high-level word finding deficits during informal conversation Written Expression Dominant Hand: Right Written Expression: Not tested Oral Motor Oral Motor/Sensory Function Overall Oral Motor/Sensory Function: Within functional limits Motor Speech Overall Motor Speech: Appears within functional limits for tasks assessed  Care Tool Care Tool Cognition Ability to hear (with hearing aid or hearing appliances if normally used Ability to hear (with hearing aid or hearing appliances if normally used): 0. Adequate - no difficulty in normal conservation, social interaction, listening to TV   Expression of Ideas and Wants Expression of Ideas and Wants: 3. Some difficulty - exhibits some difficulty with expressing needs and ideas (e.g, some words or finishing thoughts) or speech is not clear   Understanding Verbal and Non-Verbal Content Understanding Verbal and Non-Verbal Content: 3. Usually understands - understands most conversations, but misses some part/intent of message. Requires cues at times to understand  Memory/Recall Ability Memory/Recall Ability : Current season;That he or she is in a hospital/hospital unit    Bedside Swallowing Assessment General Date of Onset: 05/08/22 Previous Swallow Assessment: 05/17/22: Recommended regular textures with thin liquids Diet Prior to this Study: Regular;Thin  liquids Temperature Spikes Noted: No Respiratory Status: Room air History of Recent Intubation: No Behavior/Cognition: Alert;Cooperative Oral Cavity - Dentition: Adequate natural dentition Self-Feeding Abilities: Able to feed self;Needs assist Vision: Functional for self-feeding Patient Positioning: Upright in bed Baseline Vocal Quality: Normal Volitional Cough: Weak Volitional Swallow: Able to elicit  Thin Liquid Thin Liquid: Within functional limits Presentation:  Straw Nectar Thick Nectar Thick Liquid: Not tested Honey Thick Honey Thick Liquid: Not tested Puree Puree: Not tested Solid Solid: Within functional limits Presentation: Self Fed BSE Assessment Risk for Aspiration Impact on safety and function: Mild aspiration risk Other Related Risk Factors: Cognitive impairment;Deconditioning  Short Term Goals: Week 1: SLP Short Term Goal 1 (Week 1): Patient will demonstrate sustained attention to tasks for ~10 minutes with Min verbal cues for redirection. SLP Short Term Goal 2 (Week 1): Patient will express basic wants/needs at the sentence level with Min verbal cues to self-monitor and correct errors. SLP Short Term Goal 3 (Week 1): Patient will utilize word-finding strategies at the phrase level with Min verbal cues.  Refer to Care Plan for Long Term Goals  Recommendations for other services: Neuropsych  Discharge Criteria: Patient will be discharged from SLP if patient refuses treatment 3 consecutive times without medical reason, if treatment goals not met, if there is a change in medical status, if patient makes no progress towards goals or if patient is discharged from hospital.  The above assessment, treatment plan, treatment alternatives and goals were discussed and mutually agreed upon: by patient and by family  ,  05/21/2022, 2:59 PM

## 2022-05-21 NOTE — Progress Notes (Signed)
PROGRESS NOTE   Subjective/Complaints:  Pt reports had a bad AM due to pain- tramadol not working great- however cannot increase dose due to CKD4.  Per daughter, got loopy with  morphine and dilaudid- will try Norco after d/w daughter and pt.  Also, having rib pain due to previous pneumonia - is also very bothersome.   LBM yesterday AM- denies constipation.  Plans on eating, but hasn't eaten tray yet.    Purewick also leaked x2 last night- needed to be cleaned up ROS:  Pt denies SOB, abd pain, CP, N/V/C/D, and vision changes   Objective:   VAS [place] LOWER EXTREMITY VENOUS (DVT)  Result Date: 05/19/2022  Lower Venous DVT Study Patient Name:  Miranda Roman  Date of Exam:   05/19/2022 Medical Rec #: [id number]         Accession #:    [id number] Date of Birth: 1946-05-19        Patient Gender: F Patient Age:   76 years Exam Location:  [facility]  Date of Exam:   05/19/2022 Medical Rec #: [id number]         Accession #:    [id number] Date of Birth: 1946-05-19        Patient Gender: F Patient Age:   76 years Exam Location:  [facility] Procedure:      VAS [place] LOWER EXTREMITY VENOUS (DVT) Referring Phys: Risa Grill --------------------------------------------------------------------------------  Indications: Swelling.  Risk Factors: Status post ORIF for Tibia/Fibula fracture. Limitations: Bandages, pain with compression. Comparison Study: No prior Performing Technologist: Sharion Dove RVS  Examination Guidelines: A complete evaluation includes B-mode imaging, spectral Doppler, color Doppler, and power Doppler as needed of all accessible portions of each vessel. Bilateral testing is considered an integral part of a complete examination. Limited examinations for reoccurring indications may be performed as noted. The reflux portion of the exam is performed with the patient in reverse Trendelenburg.  +---------+---------------+---------+-----------+----------+-------------------+ RIGHT    CompressibilityPhasicitySpontaneityPropertiesThrombus Aging       +---------+---------------+---------+-----------+----------+-------------------+ CFV      Full           Yes      Yes                                      +---------+---------------+---------+-----------+----------+-------------------+ SFJ      Full                                                             +---------+---------------+---------+-----------+----------+-------------------+ FV Prox  Full                                                             +---------+---------------+---------+-----------+----------+-------------------+ FV Mid   Full                                                             +---------+---------------+---------+-----------+----------+-------------------+  FV DistalFull                                                             +---------+---------------+---------+-----------+----------+-------------------+ PFV      Full                                                             +---------+---------------+---------+-----------+----------+-------------------+ POP                                                   Not well visualized +---------+---------------+---------+-----------+----------+-------------------+ PTV                                                   Not well visualized +---------+---------------+---------+-----------+----------+-------------------+ PERO                                                  Not well visualized +---------+---------------+---------+-----------+----------+-------------------+   +----+---------------+---------+-----------+----------+--------------+ LEFTCompressibilityPhasicitySpontaneityPropertiesThrombus Aging +----+---------------+---------+-----------+----------+--------------+ CFV Full           Yes      Yes                                 +----+---------------+---------+-----------+----------+--------------+     *See table(s) above for measurements and  observations. Electronically signed by Servando Snare MD on 05/19/2022 at 6:03:44 PM.    Final    Recent Labs    05/19/22 1029 05/20/22 0509  WBC 16.4* 15.0*  HGB 9.0* 9.6*  HCT 27.7* 29.6*  PLT 217 268   Recent Labs    05/19/22 0427 05/20/22 0509  NA 146* 145  K 4.2 4.0  CL 118* 118*  CO2 21* 21*  GLUCOSE 82 98  BUN 48* 44*  CREATININE 3.04* 2.74*  CALCIUM 8.0* 8.2*    Intake/Output Summary (Last 24 hours) at 05/21/2022 0813 Last data filed at 05/20/2022 1301 Gross per 24 hour  Intake 500 ml  Output --  Net 500 ml        Physical Exam: Vital Signs Blood pressure (!) 150/64, pulse 96, temperature 99.4 F (37.4 C), temperature source Oral, resp. rate 18, height _0  (1.626 m), weight 78.9 kg, SpO2 100 %.   General: awake, alert, appropriate, sitting up in bed; RUE cast; daughter at bedside; NAD HENT: conjugate gaze; oropharynx moist CV: borderline tachycardic rate- regular rhythm; no JVD Pulmonary: CTA B/L; no W/R/R- good air movement GI: soft, NT, ND, (+)BS- normoactive Psychiatric: appropriate- but only answers questions- doesn't speak spontaneously-  Neurological: baseline cognitive impairment  Musculoskeletal:     Cervical back: Normal range of motion.     Comments: Right lower leg surgical  dressing in place; right forearm splint in place with ACE wrap. RUE tender to minimal palpation and movement.  Can wiggle toes on RLE Skin:    General: Skin is warm.  Neurological:     Mental Status: She is alert and oriented to person, place, and time.     Comments: Underlying cognitive impairment. Oriented to person, place, reason she's here. Fair insight and awareness. Normal speech, language. CN exam unremarkable. LUE 4/5. RUE limited by pain/ortho. Does move all muscle groups. LLE 2/5 prox to 4/5 distal. RLE tr-1/5 prox to 3/5 distal (can wiggle toes within splint). No focal sensory loss.     Assessment/Plan: 1. Functional deficits which require 3+ hours per day of  interdisciplinary therapy in a comprehensive inpatient rehab setting. Physiatrist is providing close team supervision and 24 hour management of active medical problems listed below. Physiatrist and rehab team continue to assess barriers to discharge/monitor patient progress toward functional and medical goals  Care Tool:  Bathing    Body parts bathed by patient: Face   Body parts bathed by helper: Right arm, Left arm, Chest, Abdomen, Front perineal area, Buttocks, Right upper leg, Left upper leg, Left lower leg Body parts n/a: Right lower leg   Bathing assist Assist Level: Total Assistance - Patient < 25%     Upper Body Dressing/Undressing Upper body dressing   What is the patient wearing?: Hospital gown only    Upper body assist Assist Level: Dependent - Patient 0%    Lower Body Dressing/Undressing Lower body dressing      What is the patient wearing?: Incontinence brief     Lower body assist Assist for lower body dressing: Dependent - Patient 0%     Toileting Toileting    Toileting assist Assist for toileting: 2 Helpers     Transfers Chair/bed transfer  Transfers assist  Chair/bed transfer activity did not occur: Safety/medical concerns  Chair/bed transfer assist level: Total Assistance - Patient < 25%     Locomotion Ambulation   Ambulation assist   Ambulation activity did not occur: Safety/medical concerns          Walk 10 feet activity   Assist  Walk 10 feet activity did not occur: Safety/medical concerns        Walk 50 feet activity   Assist Walk 50 feet with 2 turns activity did not occur: Safety/medical concerns         Walk 150 feet activity   Assist Walk 150 feet activity did not occur: Safety/medical concerns         Walk 10 feet on uneven surface  activity   Assist Walk 10 feet on uneven surfaces activity did not occur: Safety/medical concerns         Wheelchair     Assist Is the patient using a  wheelchair?: Yes (Transport only d/t WB restrictions) Type of Wheelchair: Manual Wheelchair activity did not occur: Safety/medical concerns         Wheelchair 50 feet with 2 turns activity    Assist    Wheelchair 50 feet with 2 turns activity did not occur: Safety/medical concerns       Wheelchair 150 feet activity     Assist  Wheelchair 150 feet activity did not occur: Safety/medical concerns        Medical Problem List and Plan: 1. Functional deficits secondary to multiple trauma after ground level fall             -patient may not yet shower             -  ELOS/Goals: 12-14 days, min assist to supervision goals  Con't CIR- PT, and OT and added SLP per chart  Set d/c for 12-14 days 2.  Antithrombotics: -DVT/anticoagulation:  Pharmaceutical: Lovenox 30 mg daily due to renal impairment -venous dopplers appear normal (where able to test given RLE splint)             -antiplatelet therapy: none 3. Pain Management: Tylenol, tramadol, Robaxin prn  11/14- will add Norco 5/325 mg q6 hours prn for pain and monitor cognition- cannot increase tramadol due to renal impairment- will also add Lidoderm patches 3 patches 12 hrs on; 12 hrs off- 8pm-8am  11/15- slept better due to better pain control- con't regimen per nursing, didn't get loopy with meds 4. Mood/Behavior/Sleep: LCSW to evaluate and provide emotional support             -antipsychotic agents: n/a 5. Neuropsych/cognition: This patient is not capable of making decisions on her own behalf. Consider SLP eval. 6. Skin/Wound Care: Routine skin care checks             -monitor surgical incision 7. Fluids/Electrolytes/Nutrition: Routine Is and Os and follow-up chemistries             -ensure adequate PO hydration 8: Right tib-fib fracture s/p ORIF 11/2 -NWB RLE  -follow-up with Dr. Mardelle Matte 9: Right non-displaced wrist fracture: WBAT tolerated through elbow             -maintain wrist splint             -follow-up with Dr.  Greta Doom 10: Pneumonia: continue ceftriaxone and azithromycin             -cough/deep breath/IS/FV 11: Hypertension: monitor TID and prn 12: CKD 4: continue calcitrol and sodium bicarb 13: Hyperlipidemia: continue Lipitor 14: DM2: CBGs 4 times daily             -continue Levemir 10 units daily             -continue sliding scale             -consider restarting home insulin 4 units with meals  11/14- will monitor trend- is 91-223 in last 24 hours, but give 1 more day before adding insulin  11/15- Had only 1 spike in last 24 hours- will monitor 15: Constipation: continue senna and Colace daily; prns 16: Anemia; multi-factorial: follow-up CBC 17: Leukocytosis/low grade fever: abx for pneumonia continue  11/14- WBC down to 15k from 16.4- on Rocephin  11/15- recheck in AM 18: AKI: in setting of CKD4 serum creatinine improving, still elevated (baseline 2.3-2.6)             -follow-up on BMP  11/14- Cr down to 2.74 from 3.04- so heading in right direction   11/15- recheck in AM      LOS: 2 days A FACE TO FACE EVALUATION WAS PERFORMED  Nohea Kras 05/21/2022, 8:13 AM

## 2022-05-21 NOTE — Progress Notes (Signed)
Occupational Therapy Session Note  Patient Details  Name: Miranda Roman MRN: [id number] Date of Birth: 08/13/1945  Today's Date: 05/21/2022 OT Individual Time: 7342-8768 OT Individual Time Calculation (min): 30 min  and Today's Date: 05/21/2022 OT Missed Time: 75 Minutes Missed Time Reason: Pain   Short Term Goals: Week 1:  OT Short Term Goal 1 (Week 1): Pt will completed UB dressing and bathing with Max A while seated on EOB. OT Short Term Goal 2 (Week 1): Pt will complete functional transfer from bed to [facility] with Max Ax1 utilizing lateral scoot technique and/or with LRAD. OT Short Term Goal 3 (Week 1): Pt will complete LB bathing task with Max A at bed level.  Skilled Therapeutic Interventions/Progress Updates:    Pt resting in bed upon arrival. Pt incontinent of bladder and unaware. Bed mobility with tot A+2; hygiene with tot A+2. Pt yells out with all movement that involves her RUE/LLE. Pt unable to actively participate 2/2 pain.   Therapy Documentation Precautions:  Precautions Precautions: Fall Precaution Comments: R knee pain - hold leg up Required Braces or Orthoses: Splint/Cast Splint/Cast: RUE and RLE Restrictions Weight Bearing Restrictions: Yes RUE Weight Bearing: Weight bear through elbow only RLE Weight Bearing: Non weight bearing Other Position/Activity Restrictions: Can bear weight through right elbow, not wrist General: General OT Amount of Missed Time: 45 Minutes Pain:  Pt "screams" with movement in bed    Therapy/Group: Individual Therapy  [name] 05/21/2022, 12:05 PM

## 2022-05-22 LAB — CBC WITH DIFFERENTIAL/PLATELET
Abs Immature Granulocytes: 0.07 10*3/uL (ref 0.00–0.07)
Basophils Absolute: 0 10*3/uL (ref 0.0–0.1)
Basophils Relative: 0 %
Eosinophils Absolute: 0.3 10*3/uL (ref 0.0–0.5)
Eosinophils Relative: 3 %
HCT: 26.7 % — ABNORMAL LOW (ref 36.0–46.0)
Hemoglobin: 8.9 g/dL — ABNORMAL LOW (ref 12.0–15.0)
Immature Granulocytes: 1 %
Lymphocytes Relative: 14 %
Lymphs Abs: 1.5 10*3/uL (ref 0.7–4.0)
MCH: 26.3 pg (ref 26.0–34.0)
MCHC: 33.3 g/dL (ref 30.0–36.0)
MCV: 79 fL — ABNORMAL LOW (ref 80.0–100.0)
Monocytes Absolute: 0.7 10*3/uL (ref 0.1–1.0)
Monocytes Relative: 7 %
Neutro Abs: 7.7 10*3/uL (ref 1.7–7.7)
Neutrophils Relative %: 75 %
Platelets: 314 10*3/uL (ref 150–400)
RBC: 3.38 MIL/uL — ABNORMAL LOW (ref 3.87–5.11)
RDW: 19.2 % — ABNORMAL HIGH (ref 11.5–15.5)
WBC: 10.3 10*3/uL (ref 4.0–10.5)
nRBC: 0 % (ref 0.0–0.2)

## 2022-05-22 LAB — BASIC METABOLIC PANEL
Anion gap: 7 (ref 5–15)
BUN: 33 mg/dL — ABNORMAL HIGH (ref 8–23)
CO2: 19 mmol/L — ABNORMAL LOW (ref 22–32)
Calcium: 8.1 mg/dL — ABNORMAL LOW (ref 8.9–10.3)
Chloride: 112 mmol/L — ABNORMAL HIGH (ref 98–111)
Creatinine, Ser: 2.37 mg/dL — ABNORMAL HIGH (ref 0.44–1.00)
GFR, Estimated: 21 mL/min — ABNORMAL LOW (ref >60)
Glucose, Bld: 97 mg/dL (ref 70–99)
Potassium: 3.8 mmol/L (ref 3.5–5.1)
Sodium: 138 mmol/L (ref 135–145)

## 2022-05-22 LAB — GLUCOSE, CAPILLARY
Glucose-Capillary: 93 mg/dL (ref 70–99)
Glucose-Capillary: 98 mg/dL (ref 70–99)
Glucose-Capillary: 88 mg/dL (ref 70–99)
Glucose-Capillary: 132 mg/dL — ABNORMAL HIGH (ref 70–99)

## 2022-05-22 NOTE — IPOC Note (Signed)
Overall Plan of Care [facility]) Patient Details Name: Miranda Roman MRN: [id number] DOB: [birth year]  Admitting Diagnosis: Critical polytrauma  Hospital Problems: Principal Problem:   Critical polytrauma Active Problems:   Right tibial fracture     Functional Problem List: Nursing Perception, Bladder, Bowel, Safety, Sensory, Edema, Endurance, Motor, Pain  PT Balance, Pain, Behavior, Edema, Sensory, Motor, Skin Integrity, Endurance  OT Balance, Safety, Pain, Endurance, Motor, Edema  SLP Cognition, Linguistic  TR         Basic ADL's: OT Grooming, Bathing, Dressing, Toileting     Advanced  ADL's: OT       Transfers: PT Bed Mobility, Bed to Chair, Car  OT Toilet     Locomotion: PT Wheelchair Mobility, Other (comment)     Additional Impairments: OT Fuctional Use of Upper Extremity  SLP Communication, Social Cognition expression Attention  TR      Anticipated Outcomes Item Anticipated Outcome  Self Feeding Set-up  Swallowing      Basic self-care  Min-mod A  Toileting  Mod A   Bathroom Transfers Mod A  Bowel/Bladder  Continent x 2  Transfers  mod A  Locomotion  mod A w/c with hemi technique  Communication  Min A  Cognition  Supervision  Pain  less than 4  Safety/Judgment  remain fall free while in rehab   Therapy Plan: PT Intensity: Minimum of 1-2 x/day ,45 to 90 minutes PT Frequency: 5 out of 7 days PT Duration Estimated Length of Stay: 14-18 days OT Intensity: Minimum of 1-2 x/day, 45 to 90 minutes OT Frequency: 5 out of 7 days OT Duration/Estimated Length of Stay: 2-2.5 weeks SLP Intensity: Minumum of 1-2 x/day, 30 to 90 minutes SLP Frequency: 1 to 3 out of 7 days SLP Duration/Estimated Length of Stay: 12-14 days   Team Interventions: Nursing Interventions Patient/Family Education, Pain Management, Bladder Management, Medication Management, Discharge Planning, Bowel Management, Skin Care/Wound Management, Disease Management/Prevention  PT  interventions Ambulation/gait training, Cognitive remediation/compensation, Discharge planning, DME/adaptive equipment instruction, Functional mobility training, Pain management, Psychosocial support, Splinting/orthotics, Therapeutic Activities, UE/LE Strength taining/ROM, Visual/perceptual remediation/compensation, Wheelchair propulsion/positioning, UE/LE Coordination activities, Therapeutic Exercise, Stair training, Skin care/wound management, Patient/family education, Neuromuscular re-education, Functional electrical stimulation, Disease management/prevention, Academic librarian, Training and development officer  OT Interventions Training and development officer, Academic librarian, Engineer, drilling, Neuromuscular re-education, Functional mobility training, Patient/family education, Discharge planning, Functional electrical stimulation, Pain management, Self Care/advanced ADL retraining, UE/LE Strength taining/ROM, Splinting/orthotics, Wheelchair propulsion/positioning, Therapeutic Exercise, Therapeutic Activities, UE/LE Coordination activities  SLP Interventions Cognitive remediation/compensation, Speech/Language facilitation, Therapeutic Activities, Environmental controls, Cueing hierarchy, Functional tasks, Patient/family education  TR Interventions    SW/CM Interventions Discharge Planning, Psychosocial Support, Patient/Family Education   Barriers to Discharge MD  Medical stability, Home enviroment access/loayout, Incontinence, Neurogenic bowel and bladder, Wound care, Lack of/limited family support, Weight, and Weight bearing restrictions  Nursing Decreased caregiver support, Home environment access/layout, Incontinence, Wound Care, Weight, Weight bearing restrictions 2 level home with 1/2 bath on main level with flight of ste.  3 ste to enter no rails  PT Inaccessible home environment, Incontinence, Wound Care, Weight bearing restrictions    OT Weight bearing restrictions     SLP      SW       Team Discharge Planning: Destination: PT-Home ,OT- Home , SLP-Home Projected Follow-up: PT-Home health PT, OT-  Home health OT, 24 hour supervision/assistance, SLP-Outpatient SLP, 24 hour supervision/assistance Projected Equipment Needs: PT-To be determined, Other (comment), OT- None recommended by OT, SLP-None recommended by SLP Equipment  Details: PT-pt owns RW and w/c, will need platform, OT-  Patient/family involved in discharge planning: PT- Patient, Family Midwife,  OT-Patient, Family member/caregiver, SLP-Patient, Family member/caregiver  MD ELOS: 14-18 days Medical Rehab Prognosis:  Good Assessment: The patient has been admitted for CIR therapies with the diagnosis of polytrauma with R wrsit and RLE fracture- NWB RLE. The team will be addressing functional mobility, strength, stamina, balance, safety, adaptive techniques and equipment, self-care, bowel and bladder mgt, patient and caregiver education, teach NWB status. Goals have been set at min-mod A. Anticipated discharge destination is home with daughter/son.        See Team Conference Notes for weekly updates to the plan of care

## 2022-05-22 NOTE — Progress Notes (Signed)
Occupational Therapy Session Note  Patient Details  Name: Miranda Roman MRN: [id number] Date of Birth: [birth year]  Today's Date: 05/22/2022 OT Individual Time: 5183-3582 OT Individual Time Calculation (min): 70 min    Short Term Goals: Week 1:  OT Short Term Goal 1 (Week 1): Pt will completed UB dressing and bathing with Max A while seated on EOB. OT Short Term Goal 2 (Week 1): Pt will complete functional transfer from bed to [facility] with Max Ax1 utilizing lateral scoot technique and/or with LRAD. OT Short Term Goal 3 (Week 1): Pt will complete LB bathing task with Max A at bed level.  Skilled Therapeutic Interventions/Progress Updates:    Pt resting in w/c upon arrival with son present. OT intervention with focus on SB transfers, sitting balance, bed mobility, and LUE therex with 1# weight and RUE shoulder AAROM/AROM. SB transfer w/c>bed with tot A+2. Sitting balance EOB with CGA. Sit>supine in bed with tot A+2. Pt incontinent of bladder but denied being wet. Toileting tasks dependent at bed level. Rolling R/L in bed with tot A+2. LUE therex with 1# weight: shoulder flexion, elbow flexion/etension, and horizontal adduction. RUE shoulder AAROM/AROM to maintain shoulder mobility, Pt remained in bed with all needs within reach. Bed alarm activated. Son present.   Therapy Documentation Precautions:  Precautions Precautions: Fall Precaution Comments: R knee pain - hold leg up Required Braces or Orthoses: Splint/Cast Splint/Cast: RUE and RLE Restrictions Weight Bearing Restrictions: Yes RUE Weight Bearing: Weight bear through elbow only RLE Weight Bearing: Non weight bearing Other Position/Activity Restrictions: Can bear weight through right elbow, not wrist  Pain: Pt c/o RLE pain with movement; repositioned and emotional support   Therapy/Group: Individual Therapy  [name] 05/22/2022, 12:10 PM

## 2022-05-22 NOTE — Progress Notes (Signed)
Inpatient Rehabilitation Care Coordinator Assessment and Plan Patient Details  Name: Miranda Roman MRN: [id number] Date of Birth: 1946-02-08  Today's Date: 05/22/2022  Hospital Problems: Principal Problem:   Critical polytrauma Active Problems:   Right tibial fracture  Past Medical History:  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   Borderline steroid-induced glaucoma, 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   OD, Early elevated intraocular pressure to 28 mm we will discontinue all topical medications from surgery in the right eye today   Bronchiectasis without complication 21/30/8657   Cataract 07/03/2020   Chest pain at rest 01/30/2015   Chronic kidney disease, stage 4 (severe) 12/07/2020   Closed fracture of right tibial plateau 07/17/2021   Cyst of left kidney 03/10/2019   Needs repeated US 08/2019   Dysphagia, neurologic    Elevated d-dimer 12/20/2019   Facet arthritis of lumbar region 02/27/2019   Gastroesophageal reflux disease 08/26/2016   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   Hyperglycemia due to type 2 diabetes mellitus 12/07/2020   Hyperlipidemia    Hypertensive nephropathy 10/15/2017   Inadequate oral nutritional intake    Leukocytosis 12/22/2019   Long term (current) use of insulin 12/07/2020   Low grade squamous intraepithelial lesion (LGSIL) on cervicovaginal cytologic smear 12/15/2018   Lumbago 02/27/2019   Macular atrophy, retinal  08/22/2021   OS present prior to surgery on the right eye, and now OD with similar macular atrophy.  Further history patient suffered possible Wernicke's encephalopathy the past, a type of the vitamin deficiencies, thiamine that can also affect the retina if there is significant damage which may not be recoverable.   Mild neurocognitive disorder due to multiple etiologies 01/23/2022   Osteoporosis 12/15/2018   Overweight with body mass index (BMI) 25.0-29.9 12/15/2018   PAD (peripheral artery disease) 06/06/2019   Pain in right hand 04/27/2020   Pancreatic cyst 06/06/2019   Polyneuropathy due to type 2 diabetes mellitus    Posterior vitreous detachment of left eye 08/05/2021   Proliferative diabetic retinopathy of right eye 08/22/2021   Likely contributory cause of nonclearing vitreous hemorrhage in the right eye now status post PRP likely to resolve in 2 quiescent PDR, post vitrectomy PRP   Pulmonary hypertension 02/04/2021   Right knee pain 07/10/2021   Sickle-cell trait 12/07/2020   Type II diabetes mellitus 03/10/2012   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   Vitreous hemorrhage of right eye 08/05/2021   Vitrectomy PRP 08-14-2021   Past Surgical History:  Past Surgical History:  Procedure Laterality Date   BONE RESECTION  01/2006   wrist   GASTRIC BYPASS  08/14/2009   OPEN REDUCTION INTERNAL FIXATION (ORIF) TIBIA/FIBULA FRACTURE Right 05/10/2022   Procedure: OPEN REDUCTION INTERNAL FIXATION (ORIF) TIBIA/FIBULA FRACTURE;  Surgeon: Marchia Bond, MD;  Location: [facility]S;  Service: Orthopedics;  Laterality: Right;   OTHER SURGICAL HISTORY     sickle cell retinopathy laser surgery   TUBAL LIGATION  04/28/1977   Social History:  reports  that she quit smoking about 17 years ago. Her smoking use included cigarettes. She started smoking about 60 years ago. She has a 10.75 pack-year smoking history. She has never used smokeless tobacco. She reports that she does  not currently use alcohol. She reports that she does not use drugs.  Family / Support Systems Marital Status: Widow/Widower Patient Roles: Parent Spouse/Significant Other: Widowed Children: 3 children- John (oldest; lives in [place]), [name] (youngest; lives in [place] and [place]ie in today), and [name] (dtr) Other Supports: private aide services M-F/10am-12pm. Assistance in the morning, and then a 2hr lapse in the AM and 3-4hrs in the evening. Family comes in to assist. Anticipated Caregiver: Various family members and private aide Ability/Limitations of Caregiver: Plans to have 24/7 care with aide service in place for pt. Caregiver Availability: 24/7 Family Dynamics: Pt lives alone with private aide assistance and family that come in to assist PRN.  Social History Preferred language: English Religion: Protestant Cultural Background: Pt worked in Science writer until retirement in 2011 Education: college [place] [employer] - How often do you need to have someone help you when you read instructions, pamphlets, or other written material from your doctor or pharmacy?: Never Writes: Yes Employment Status: Retired Public relations account executive Issues: Denies Guardian/Conservator: N/A   Abuse/Neglect Abuse/Neglect Assessment Can Be Completed: Yes Physical Abuse: Denies Verbal Abuse: Denies Sexual Abuse: Denies Exploitation of patient/patient's resources: Denies Self-Neglect: Denies  Patient response to: Social Isolation - How often do you feel lonely or isolated from those around you?: Never  Emotional Status Pt's affect, behavior and adjustment status: Pt slow to respond to questions asked during assessment. Pt otherwise sad about having to stay in CIR for up to 2.5 weeks. Recent Psychosocial Issues: Pt admits to depression due to arm/leg broken. Psychiatric History: None reported. Substance Abuse History: Denies  Patient / Family Perceptions, Expectations & Goals Pt/Family understanding of  illness & functional limitations: Pt and family have a general understanding of pt care needs Premorbid pt/family roles/activities: some assistnace with ADLs/IADLs Anticipated changes in roles/activities/participation: continued assistance with ADLs/IADLs Pt/family expectations/goals: pt unsure on goals. Patient's dtr would like for her mother to be mobile a sshe was prior to her fall, and be able to get up on her own, and be able to perform on self-care.  Community Resources [facility]: None Premorbid Home Care/DME Agencies: None Transportation available at discharge: TBD Is the patient able to respond to transportation needs?: Yes In the past 12 months, has lack of transportation kept you from medical appointments or from getting medications?: No In the past 12 months, has lack of transportation kept you from meetings, work, or from getting things needed for daily living?: No Resource referrals recommended: Neuropsychology  Discharge Planning Living Arrangements: Alone Support Systems: Children, Home care staff Type of Residence: Private residence Insurance Resources: Multimedia programmer (specify) ([employer] Medicare) Financial Resources: Social Security Financial Screen Referred: No Living Expenses: Own Money Management: Family Does the patient have any problems obtaining your medications?: No Home Management: Pt had assistance with managing home care needs. Patient/Family Preliminary Plans: TBD Care Coordinator Anticipated Follow Up Needs: HH/OP Expected length of stay: 14-18 days.  Clinical Impression SW met with pt in room to introduce self, explain role, and discuss discharge process. Pt private aide in room and pt dtr Tedra Coupe on conference call during assessment. Pt is not a English as a second language teacher. HCPOA- [name] (durable POA). DME: cane, crutches, RW, w/c, 3in1 BSC, TTB.   Dorathy Stallone A Laranda Burkemper 05/22/2022, 3:41 PM

## 2022-05-22 NOTE — Progress Notes (Signed)
PROGRESS NOTE   Subjective/Complaints:  Having significant amount of urine- "flooding bed" multiple times per day.   On IVFs still 65cc/hour.   Wallie Renshaw is today.   Pain "OK".  Needed to cut up her food for pt this AM.    ROS:  Pt denies SOB, abd pain, CP, N/V/C/D, and vision changes   Objective:   No results found. Recent Labs    05/20/22 0509 05/22/22 0631  WBC 15.0* 10.3  HGB 9.6* 8.9*  HCT 29.6* 26.7*  PLT 268 314   Recent Labs    05/20/22 0509 05/22/22 0631  NA 145 138  K 4.0 3.8  CL 118* 112*  CO2 21* 19*  GLUCOSE 98 97  BUN 44* 33*  CREATININE 2.74* 2.37*  CALCIUM 8.2* 8.1*    Intake/Output Summary (Last 24 hours) at 05/22/2022 0842 Last data filed at 05/22/2022 0700 Gross per 24 hour  Intake 1175 ml  Output 250 ml  Net 925 ml        Physical Exam: Vital Signs Blood pressure (!) 160/67, pulse 95, temperature 98.9 F (37.2 C), temperature source Oral, resp. rate 18, height 5' 4" (1.626 m), weight 78.9 kg, SpO2 100 %.    General: awake, alert, appropriate, sitting up trying to eat breakfast; knew today was her birthday- trying to call son;  NAD HENT: conjugate gaze; oropharynx moist CV: regular to borderline tachycardic rate; no JVD Pulmonary: CTA B/L; no W/R/R- good air movement GI: soft, NT, ND, (+)BS Psychiatric: appropriate- quiet Neurological: baseline cognitive issues, but is worse per son Musculoskeletal:     Cervical back: Normal range of motion.     Comments: Right lower leg surgical dressing in place; right forearm splint in place with ACE wrap. RUE tender to minimal palpation and movement.  Can wiggle toes on RLE Skin:    General: Skin is warm.  Neurological:     Mental Status: She is alert and oriented to person, place, and time.     Comments: Underlying cognitive impairment. Oriented to person, place, reason she's here. Fair insight and awareness. Normal speech,  language. CN exam unremarkable. LUE 4/5. RUE limited by pain/ortho. Does move all muscle groups. LLE 2/5 prox to 4/5 distal. RLE tr-1/5 prox to 3/5 distal (can wiggle toes within splint). No focal sensory loss.     Assessment/Plan: 1. Functional deficits which require 3+ hours per day of interdisciplinary therapy in a comprehensive inpatient rehab setting. Physiatrist is providing close team supervision and 24 hour management of active medical problems listed below. Physiatrist and rehab team continue to assess barriers to discharge/monitor patient progress toward functional and medical goals  Care Tool:  Bathing    Body parts bathed by patient: Face   Body parts bathed by helper: Right arm, Left arm, Chest, Abdomen, Front perineal area, Buttocks, Right upper leg, Left upper leg, Left lower leg Body parts n/a: Right lower leg   Bathing assist Assist Level: Total Assistance - Patient < 25%     Upper Body Dressing/Undressing Upper body dressing   What is the patient wearing?: Hospital gown only    Upper body assist Assist Level: Dependent - Patient 0%  Lower Body Dressing/Undressing Lower body dressing      What is the patient wearing?: Incontinence brief     Lower body assist Assist for lower body dressing: Dependent - Patient 0%     Toileting Toileting    Toileting assist Assist for toileting: 2 Helpers     Transfers Chair/bed transfer  Transfers assist  Chair/bed transfer activity did not occur: Safety/medical concerns  Chair/bed transfer assist level: Total Assistance - Patient < 25%     Locomotion Ambulation   Ambulation assist   Ambulation activity did not occur: Safety/medical concerns          Walk 10 feet activity   Assist  Walk 10 feet activity did not occur: Safety/medical concerns        Walk 50 feet activity   Assist Walk 50 feet with 2 turns activity did not occur: Safety/medical concerns         Walk 150 feet  activity   Assist Walk 150 feet activity did not occur: Safety/medical concerns         Walk 10 feet on uneven surface  activity   Assist Walk 10 feet on uneven surfaces activity did not occur: Safety/medical concerns         Wheelchair     Assist Is the patient using a wheelchair?: Yes (Transport only d/t WB restrictions) Type of Wheelchair: Manual Wheelchair activity did not occur: Safety/medical concerns         Wheelchair 50 feet with 2 turns activity    Assist    Wheelchair 50 feet with 2 turns activity did not occur: Safety/medical concerns       Wheelchair 150 feet activity     Assist  Wheelchair 150 feet activity did not occur: Safety/medical concerns        Medical Problem List and Plan: 1. Functional deficits secondary to multiple trauma after ground level fall             -patient may not yet shower             -ELOS/Goals: 12-14 days, min assist to supervision goals  Con't CIR- PT and OT and SLP  Set d/c for 12-14 days 2.  Antithrombotics: -DVT/anticoagulation:  Pharmaceutical: Lovenox 30 mg daily due to renal impairment -venous dopplers appear normal (where able to test given RLE splint)             -antiplatelet therapy: none 3. Pain Management: Tylenol, tramadol, Robaxin prn  11/14- will add Norco 5/325 mg q6 hours prn for pain and monitor cognition- cannot increase tramadol due to renal impairment- will also add Lidoderm patches 3 patches 12 hrs on; 12 hrs off- 8pm-8am  11/15- slept better due to better pain control- con't regimen per nursing, didn't get loopy with meds  11/16- pain well controlled per pt 4. Mood/Behavior/Sleep: LCSW to evaluate and provide emotional support             -antipsychotic agents: n/a 5. Neuropsych/cognition: This patient is not capable of making decisions on her own behalf. Consider SLP eval.  11/16- sent SLP eval 6. Skin/Wound Care: Routine skin care checks             -monitor surgical  incision 7. Fluids/Electrolytes/Nutrition: Routine Is and Os and follow-up chemistries             -ensure adequate PO hydration 8: Right tib-fib fracture s/p ORIF 11/2 -NWB RLE  -follow-up with Dr. Mardelle Matte 9: Right non-displaced wrist fracture: WBAT tolerated  through elbow             -maintain wrist splint             -follow-up with Dr. Greta Doom 10: Pneumonia: continue ceftriaxone and azithromycin             -cough/deep breath/IS/FV 11: Hypertension: monitor TID and prn 12: CKD 4: continue calcitrol and sodium bicarb  11/16- will stop IVFs- back to baseline 13: Hyperlipidemia: continue Lipitor 14: DM2: CBGs 4 times daily             -continue Levemir 10 units daily             -continue sliding scale             -consider restarting home insulin 4 units with meals  11/14- will monitor trend- is 91-223 in last 24 hours, but give 1 more day before adding insulin  11/15- Had only 1 spike in last 24 hours- will monitor  11/16- CBGs looking better- con't regimen 15: Constipation: continue senna and Colace daily; prns 16: Anemia; multi-factorial: follow-up CBC 17: Leukocytosis/low grade fever: abx for pneumonia continue  11/14- WBC down to 15k from 16.4- on Rocephin  11/15- recheck in AM  11/16- WBC 10.3- 18: AKI: in setting of CKD4 serum creatinine improving, still elevated (baseline 2.3-2.6)             -follow-up on BMP  11/14- Cr down to 2.74 from 3.04- so heading in right direction   11/15- recheck in AM  11/16- Cr 2.37- will stop IVFs since at baseline 19. Confusion  11/16- will check U/A and Cx since family says confusion worse/not at baseline- esp since flooding bed multiple times per day  I spent a total of 53   minutes on total care today- >50% coordination of care- due to d/w nursing x2- and nursing mgr- as well as IPOC.      LOS: 3 days A FACE TO FACE EVALUATION WAS PERFORMED  Damonie Furney 05/22/2022, 8:42 AM

## 2022-05-22 NOTE — Progress Notes (Signed)
Speech Language Pathology Discharge Summary  Patient Details  Name: Miranda Roman MRN: [id number] Date of Birth: 1945/11/29  Date of Discharge from SLP service:May 22, 2022  Today's Date: 05/22/2022 SLP Individual Time: 1330-1400 SLP Individual Time Calculation (min): 30 min   Skilled Therapeutic Interventions:  Skilled treatment session focused on communication goals. Upon arrival, patient was awake and alert while upright in bed. SLP facilitated session by providing extra time and overall supervision level verbal cues for word-finding at the sentence level during an informal conversation while reporting events from previous therapy sessions. Patient also demonstrated increased attention to conversation today with improved organization of thoughts, especially in regards to talking about her birthday. Patient's son present at end of session and reported that the family would prefer time on CIR to be spent with PT/OT and would prefer f/u  SLP services at discharge. SLP verbalized understanding, therefore, SLP will be discharged from skilled SLP intervention.   Patient has met 3 of 3 long term goals.  Patient to discharge at [facility] level.   Reasons goals not met: N/A   Clinical Impression/Discharge Summary: Patient has made functional gains and has met 3 of 3 LTGs this reporting period. Currently, patient requires overall Min A verbal cues for word-finding at the phrase and sentence level while expressing her wants/needs. Patient also requires overall Min A verbal cues for sustained attention to basic and familiar functional and verbal tasks. Patient and family education is complete and patient will be discharged from skilled SLP intervention per family's request with f/u SLP services warranted at discharge.   Care Partner:  Caregiver Able to Provide Assistance: Yes  Type of Caregiver Assistance: Physical;Cognitive  Recommendation:  Outpatient SLP;24 hour supervision/assistance   Rationale for SLP Follow Up: Reduce caregiver burden;Maximize cognitive function and independence   Equipment: N/A   Reasons for discharge: Treatment goals met;Other (comment) (Patient prefers time on CIR to be spent with PT/OT and would like f/u SLP at discharge)   Patient/Family Agrees with Progress Made and Goals Achieved: Yes    [place], [place] 05/22/2022, 3:48 PM

## 2022-05-22 NOTE — Plan of Care (Signed)
  Problem: RH Expression Communication Goal: LTG Patient will verbally express basic/complex needs(SLP) Description: LTG:  Patient will verbally express basic/complex needs, wants or ideas with cues  (SLP) Outcome: Completed/Met Goal: LTG Patient will increase word finding of common (SLP) Description: LTG:  Patient will increase word finding of common objects/daily info/abstract thoughts with cues using compensatory strategies (SLP). Outcome: Completed/Met   Problem: RH Attention Goal: LTG Patient will demonstrate this level of attention during functional activites (SLP) Description: LTG:  Patient will will demonstrate this level of attention during functional activites (SLP) Outcome: Completed/Met

## 2022-05-22 NOTE — Progress Notes (Addendum)
Physical Therapy Session Note  Patient Details  Name: Miranda Roman MRN: [id number] Date of Birth: 23-Apr-1946  Today's Date: 05/22/2022 PT Individual Time: 7741-4239 and 1303-1330 PT Individual Time Calculation (min): 60 min and 24 min  Short Term Goals: Week 1:  PT Short Term Goal 1 (Week 1): Pt will perform STS +1 assist PT Short Term Goal 2 (Week 1): Pt will tolerate sitting OOB >1 hour between sessions PT Short Term Goal 3 (Week 1): Pt will perform least restrictive transfer with max A  Skilled Therapeutic Interventions/Progress Updates: Pt presented in bed with son present agreeable to therapy. Pt premedicated per nsg and c/o current pain 2/10 wth rest breaks provided during session as needed. Session focused on bed mobility, therex and transfers. Pt performed supine therex in preparation for sitting EOB including LLE ankle pumps, B heel slides, RLE hip abd/add, and R quad sets. Pt the performed rolling L/R with maxA tochange brief due to urinary incontinence and donning pants total A. Pt then performed supine to sit with maxA x 2 with heavy use of bed features. Pt able to scoot to EOB with modA fading to maxA due to fatigue. Pt able to tolerate sitting EOB ~5 min in preparation for Slide board transfer. Pt set up and performed Slide board transfer to R with pt placing RUE on pt's shoulder to avoid placing pressure on RUE. Once pt in chair pt was able to use L UE/LE to scoot self back into chair. Pt left in chair at end of session with belt alarm on, call bell with reach and son present.   Tx2: Pt presented in bed with nsg and family present agreeable to therapy. Pt receiving pain meds. Session focused on bed mobility and standing attempt. Pt able to place self into long sit as pt reached forward to ask what was on ace bandage, however when PTA asked pt to sit up pt with increased difficulty motor planning to reach position. Pt performed supine to sit at EOB with increased time and maxA. Pt  set up to with PFRW and PTA placed foot under RLE to decrease attempt at weight bearing. With bed elevated pt able to come to almost complete stand with modA x2 however pt unable to complete anterior translation of hips to reach full erect posture. Pt requesting for PTA to "move foot out of the way" with PTA explaining unable to do so without pt placing weight on foot. Pt returned to sitting with minA for controlled descent. Pt unable to motor plan leaning onto R elbow to as pt grasping onto sheet with L hand. After more than a reasonable time and max multimodal cues pt was able to lean onto elbow and sit to supine transfer completed with maxA x 2. Pt noted to be incontinent of bladder with NT notified at end of session. Pt repositioned to [facility] and left with foot elevated, call bell within reach and needs met.     Therapy Documentation Precautions:  Precautions Precautions: Fall Precaution Comments: R knee pain - hold leg up Required Braces or Orthoses: Splint/Cast Splint/Cast: RUE and RLE Restrictions Weight Bearing Restrictions: Yes RUE Weight Bearing: Weight bear through elbow only RLE Weight Bearing: Non weight bearing Other Position/Activity Restrictions: Can bear weight through right elbow, not wrist General:   Vital Signs: Therapy Vitals Temp: 99 F (37.2 C) Temp Source: Oral Pulse Rate: 79 Resp: 16 BP: 137/70 Patient Position (if appropriate): Lying Oxygen Therapy SpO2: 100 % O2 Device: Room Air Pain:  Mobility:   Locomotion :    Trunk/Postural Assessment :    Balance:   Exercises:   Other Treatments:      Therapy/Group: Individual Therapy  Demiah Gullickson 05/22/2022, 4:27 PM

## 2022-05-23 LAB — URINALYSIS, ROUTINE W REFLEX MICROSCOPIC
Bilirubin Urine: NEGATIVE
Glucose, UA: NEGATIVE mg/dL
Hgb urine dipstick: NEGATIVE
Ketones, ur: NEGATIVE mg/dL
Leukocytes,Ua: NEGATIVE
Nitrite: NEGATIVE
Protein, ur: 30 mg/dL — AB
Specific Gravity, Urine: 1.008 (ref 1.005–1.030)
pH: 6 (ref 5.0–8.0)

## 2022-05-23 LAB — GLUCOSE, CAPILLARY
Glucose-Capillary: 88 mg/dL (ref 70–99)
Glucose-Capillary: 94 mg/dL (ref 70–99)
Glucose-Capillary: 99 mg/dL (ref 70–99)
Glucose-Capillary: 150 mg/dL — ABNORMAL HIGH (ref 70–99)

## 2022-05-23 NOTE — Progress Notes (Signed)
Occupational Therapy Session Note  Patient Details  Name: Miranda Roman MRN: [id number] Date of Birth: Jun 05, 1946  Today's Date: 05/23/2022 OT Individual Time: 1115-1200 OT Individual Time Calculation (min): 45 min  and Today's Date: 05/23/2022 OT Missed Time: 30 Minutes Missed Time Reason: Nursing care   Short Term Goals: Week 1:  OT Short Term Goal 1 (Week 1): Pt will completed UB dressing and bathing with Max A while seated on EOB. OT Short Term Goal 2 (Week 1): Pt will complete functional transfer from bed to [facility] with Max Ax1 utilizing lateral scoot technique and/or with LRAD. OT Short Term Goal 3 (Week 1): Pt will complete LB bathing task with Max A at bed level.  Skilled Therapeutic Interventions/Progress Updates:    Pt resting in bed upon arrival. OT intervention with focus on bed mobility, UB dressing seated EOB and LB dressing with sit<>stand from EOB. Supine>sit EOB with max A+2 and max verbal cues for sequencing. Sitting balance EOB with CGA. Pt required max A for UB dressing tasks and tot A+2 for LB dressing. Sit<>stand from EOB with max A+2. SB transfer to w/c with tot A+2. Pt remained in w/c with belt alarm activated. All needs within reach and belt alarm activated. Son present.  Therapy Documentation Precautions:  Precautions Precautions: Fall Precaution Comments: R knee pain - hold leg up Required Braces or Orthoses: Splint/Cast Splint/Cast: RUE and RLE Restrictions Weight Bearing Restrictions: Yes RUE Weight Bearing: Weight bear through elbow only RLE Weight Bearing: Non weight bearing Other Position/Activity Restrictions: Can bear weight through right elbow, not wrist General: General OT Amount of Missed Time: 30 Minutes   Pain:  Pt c/o RUE and RLE pain with transitional movements; repositioned and emotional support   Therapy/Group: Individual Therapy  [name] 05/23/2022, 12:06 PM

## 2022-05-23 NOTE — Progress Notes (Signed)
Physical Therapy Session Note  Patient Details  Name: Miranda Roman MRN: [id number] Date of Birth: 13-Apr-1946  Today's Date: 05/23/2022 PT Individual Time: 1300-1330 PT Individual Time Calculation (min): 30 min   Short Term Goals: Week 1:  PT Short Term Goal 1 (Week 1): Pt will perform STS +1 assist PT Short Term Goal 2 (Week 1): Pt will tolerate sitting OOB >1 hour between sessions PT Short Term Goal 3 (Week 1): Pt will perform least restrictive transfer with max A  Skilled Therapeutic Interventions/Progress Updates:    pt received in w/c and agreeable to therapy. Pt reports pain controlled at rest but elevated with mobility, premedicated. Rest and positioning provided as needed. Pt transported to therapy gym for time management and energy conservation. Session focused on slideboard transfer practice for improved independence. Pt able to perform slideboard transfer x 2 with max A x 2 and minimal crying out in pain compared to previous sessions. Pt with improved ability to anterior weight shift with assist and needing min-mod cues for body positioning and weight bearing status. Pt returned to room after session and remained in w/c with her friend present.   Therapy Documentation Precautions:  Precautions Precautions: Fall Precaution Comments: R knee pain - hold leg up Required Braces or Orthoses: Splint/Cast Splint/Cast: RUE and RLE Restrictions Weight Bearing Restrictions: Yes RUE Weight Bearing: Weight bear through elbow only RLE Weight Bearing: Non weight bearing Other Position/Activity Restrictions: Can bear weight through right elbow, not wrist General:       Therapy/Group: Individual Therapy  Mickel Fuchs 05/23/2022, 1:39 PM

## 2022-05-23 NOTE — Progress Notes (Addendum)
Specimen obtained for UA & C/S. PA Notified.     Yehuda Mao, LPN

## 2022-05-23 NOTE — Progress Notes (Signed)
PROGRESS NOTE   Subjective/Complaints:  Pt off IVFs.  Had an incontinent BM in bed today per nursing, Pt reports no issues Daughter asking for Zyrtec, but we don't have it.   Daughter also bringing AZO for pt.  ROS:  Pt denies SOB, abd pain, CP, N/V/C/D, and vision changes   Objective:   No results found. Recent Labs    05/22/22 0631  WBC 10.3  HGB 8.9*  HCT 26.7*  PLT 314   Recent Labs    05/22/22 0631  NA 138  K 3.8  CL 112*  CO2 19*  GLUCOSE 97  BUN 33*  CREATININE 2.37*  CALCIUM 8.1*    Intake/Output Summary (Last 24 hours) at 05/23/2022 0820 Last data filed at 05/23/2022 0715 Gross per 24 hour  Intake --  Output 700 ml  Net -700 ml        Physical Exam: Vital Signs Blood pressure (!) 133/97, pulse 87, temperature 99 F (37.2 C), temperature source Oral, resp. rate 18, height _0  (1.626 m), weight 78.9 kg, SpO2 100 %.     General: awake, alert, appropriate, being cleaned up by nursing from BM; NAD HENT: conjugate gaze; oropharynx moist CV: regular rate; no JVD Pulmonary: CTA B/L; no W/R/R- good air movement GI: soft, NT, ND, (+)BS Psychiatric: appropriate- quiet Neurological: slowed/delayed responses- like I normally see Musculoskeletal:     Cervical back: Normal range of motion.     Comments: Right lower leg surgical dressing in place; right forearm splint in place with ACE wrap. RUE tender to minimal palpation and movement.  Can wiggle toes on RLE- no change Skin:    General: Skin is warm.  Neurological:     Mental Status: She is alert and oriented to person, place, and time.     Comments: Underlying cognitive impairment. Oriented to person, place, reason she's here. Fair insight and awareness. Normal speech, language. CN exam unremarkable. LUE 4/5. RUE limited by pain/ortho. Does move all muscle groups. LLE 2/5 prox to 4/5 distal. RLE tr-1/5 prox to 3/5 distal (can wiggle toes  within splint). No focal sensory loss.     Assessment/Plan: 1. Functional deficits which require 3+ hours per day of interdisciplinary therapy in a comprehensive inpatient rehab setting. Physiatrist is providing close team supervision and 24 hour management of active medical problems listed below. Physiatrist and rehab team continue to assess barriers to discharge/monitor patient progress toward functional and medical goals  Care Tool:  Bathing    Body parts bathed by patient: Face   Body parts bathed by helper: Right arm, Left arm, Chest, Abdomen, Front perineal area, Buttocks, Right upper leg, Left upper leg, Left lower leg Body parts n/a: Right lower leg   Bathing assist Assist Level: Total Assistance - Patient < 25%     Upper Body Dressing/Undressing Upper body dressing   What is the patient wearing?: Hospital gown only    Upper body assist Assist Level: Dependent - Patient 0%    Lower Body Dressing/Undressing Lower body dressing      What is the patient wearing?: Incontinence brief     Lower body assist Assist for lower body dressing: Dependent - Patient  0%     Toileting Toileting    Toileting assist Assist for toileting: 2 Helpers     Transfers Chair/bed transfer  Transfers assist  Chair/bed transfer activity did not occur: Safety/medical concerns  Chair/bed transfer assist level: Total Assistance - Patient < 25%     Locomotion Ambulation   Ambulation assist   Ambulation activity did not occur: Safety/medical concerns          Walk 10 feet activity   Assist  Walk 10 feet activity did not occur: Safety/medical concerns        Walk 50 feet activity   Assist Walk 50 feet with 2 turns activity did not occur: Safety/medical concerns         Walk 150 feet activity   Assist Walk 150 feet activity did not occur: Safety/medical concerns         Walk 10 feet on uneven surface  activity   Assist Walk 10 feet on uneven surfaces  activity did not occur: Safety/medical concerns         Wheelchair     Assist Is the patient using a wheelchair?: Yes (Transport only d/t WB restrictions) Type of Wheelchair: Manual Wheelchair activity did not occur: Safety/medical concerns         Wheelchair 50 feet with 2 turns activity    Assist        Assist Level: Dependent - Patient 0%   Wheelchair 150 feet activity     Assist      Assist Level: Dependent - Patient 0%    Medical Problem List and Plan: 1. Functional deficits secondary to multiple trauma after ground level fall             -patient may not yet shower             -ELOS/Goals: 12-14 days, min assist to supervision goals  Con't CIR- PT, OT and SLP  Set d/c for 12-14 days 2.  Antithrombotics: -DVT/anticoagulation:  Pharmaceutical: Lovenox 30 mg daily due to renal impairment -venous dopplers appear normal (where able to test given RLE splint)             -antiplatelet therapy: none 3. Pain Management: Tylenol, tramadol, Robaxin prn  11/14- will add Norco 5/325 mg q6 hours prn for pain and monitor cognition- cannot increase tramadol due to renal impairment- will also add Lidoderm patches 3 patches 12 hrs on; 12 hrs off- 8pm-8am  11/15- slept better due to better pain control- con't regimen per nursing, didn't get loopy with meds  11/16- pain well controlled per pt 4. Mood/Behavior/Sleep: LCSW to evaluate and provide emotional support             -antipsychotic agents: n/a 5. Neuropsych/cognition: This patient is not capable of making decisions on her own behalf. Consider SLP eval.  11/16- sent SLP eval 6. Skin/Wound Care: Routine skin care checks             -monitor surgical incision 7. Fluids/Electrolytes/Nutrition: Routine Is and Os and follow-up chemistries             -ensure adequate PO hydration 8: Right tib-fib fracture s/p ORIF 11/2 -NWB RLE  -follow-up with Dr. Mardelle Matte 9: Right non-displaced wrist fracture: WBAT tolerated  through elbow             -maintain wrist splint             -follow-up with Dr. 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  11/16- will stop IVFs- back to baseline 13: Hyperlipidemia: continue Lipitor 14: DM2: CBGs 4 times daily             -continue Levemir 10 units daily             -continue sliding scale             -consider restarting home insulin 4 units with meals  11/14- will monitor trend- is 91-223 in last 24 hours, but give 1 more day before adding insulin  11/15- Had only 1 spike in last 24 hours- will monitor  11/17- CBGs looking great- con't regimen 15: Constipation: continue senna and Colace daily; prns 16: Anemia; multi-factorial: follow-up CBC 17: Leukocytosis/low grade fever: abx for pneumonia continue  11/14- WBC down to 15k from 16.4- on Rocephin  11/15- recheck in AM  11/16- WBC 10.3- 18: AKI: in setting of CKD4 serum creatinine improving, still elevated (baseline 2.3-2.6)             -follow-up on BMP  11/14- Cr down to 2.74 from 3.04- so heading in right direction   11/15- recheck in AM  11/16- Cr 2.37- will stop IVFs since at baseline 19. Confusion  11/16- will check U/A and Cx since family says confusion worse/not at baseline- esp since flooding bed multiple times per day  11/17- U/A not back- will order again just to make sure  -spoke with charge nurse- they can cath her if need be to get specimen.   I spent a total of  35   minutes on total care today- >50% coordination of care- due to speaking with nursing about U/A and Cx- hasn't had foley since 11/5    LOS: 4 days A FACE TO FACE EVALUATION WAS PERFORMED  Miranda Roman 05/23/2022, 8:20 AM

## 2022-05-23 NOTE — Progress Notes (Signed)
Occupational Therapy Session Note  Patient Details  Name: Miranda Roman MRN: [id number] Date of Birth: [birth year]  Today's Date: 05/23/2022 OT Individual Time: 1345-1430 OT Individual Time Calculation (min): 45 min    Short Term Goals: Week 1:  OT Short Term Goal 1 (Week 1): Pt will completed UB dressing and bathing with Max A while seated on EOB. OT Short Term Goal 2 (Week 1): Pt will complete functional transfer from bed to [facility] with Max Ax1 utilizing lateral scoot technique and/or with LRAD. OT Short Term Goal 3 (Week 1): Pt will complete LB bathing task with Max A at bed level.  Skilled Therapeutic Interventions/Progress Updates:    Upon entering room, nursing staff placing Maximove sling under pt for transfer to bed per pt's request to void. OTA assisted with dependent transfer. Bed mobility with max A to remove bed pan and roll in bed to doff/don pants at bed level. Max A rolling in bed to remove sling. +2 to reposition in bed. Pt engaged in BUE shoulder AARO/AROM: 2# weight for LUE and body weight for RUE. Pt remained in bed with all needs within reach. Caregiver present.   Therapy Documentation Precautions:  Precautions Precautions: Fall Precaution Comments: R knee pain - hold leg up Required Braces or Orthoses: Splint/Cast Splint/Cast: RUE and RLE Restrictions Weight Bearing Restrictions: Yes RUE Weight Bearing: Weight bear through elbow only RLE Weight Bearing: Non weight bearing Other Position/Activity Restrictions: Can bear weight through right elbow, not wrist Pain:  Pt c/o RLE and RUE pain with transitional movements; repositioned and emotional support   Therapy/Group: Individual Therapy  [name] 05/23/2022, 2:41 PM

## 2022-05-23 NOTE — Progress Notes (Signed)
Physical Therapy Session Note  Patient Details  Name: Miranda Roman MRN: [id number] Date of Birth: [birth year]  Today's Date: 05/23/2022 PT Individual Time: 0845-0930 PT Individual Time Calculation (min): 45 min   Short Term Goals: Week 1:  PT Short Term Goal 1 (Week 1): Pt will perform STS +1 assist PT Short Term Goal 2 (Week 1): Pt will tolerate sitting OOB >1 hour between sessions PT Short Term Goal 3 (Week 1): Pt will perform least restrictive transfer with max A  Skilled Therapeutic Interventions/Progress Updates:    pt received in bed and agreeable to therapy. Pt reports no pain at rest, some discomfort with mobility, premedicated. Rest and positioning provided as needed. Pt participated in bed level exercise to build strength and endurance in preparation for transfers. Pt performed the following exercises to promote LE strength and endurance:  LLE: Ankle pumps  Heel slides SLR Bridge  RLE  Quad set SLR with active assist SAQ with active assist.   Discussed that exercises performed with LLE could be performed between sessions for improved activity tolerance. Pt remained in bed after session and was left with all needs in reach and alarm active.   Therapy Documentation Precautions:  Precautions Precautions: Fall Precaution Comments: R knee pain - hold leg up Required Braces or Orthoses: Splint/Cast Splint/Cast: RUE and RLE Restrictions Weight Bearing Restrictions: Yes RUE Weight Bearing: Weight bear through elbow only RLE Weight Bearing: Non weight bearing Other Position/Activity Restrictions: Can bear weight through right elbow, not wrist General:       Therapy/Group: Individual Therapy  Mickel Fuchs 05/23/2022, 12:31 PM

## 2022-05-23 NOTE — Progress Notes (Signed)
PA Notified of UA results. Awaiting culture results.   Yehuda Mao, LPN

## 2022-05-24 DIAGNOSIS — I10 Essential (primary) hypertension: Secondary | ICD-10-CM

## 2022-05-24 DIAGNOSIS — R410 Disorientation, unspecified: Secondary | ICD-10-CM

## 2022-05-24 LAB — GLUCOSE, CAPILLARY
Glucose-Capillary: 93 mg/dL (ref 70–99)
Glucose-Capillary: 97 mg/dL (ref 70–99)
Glucose-Capillary: 132 mg/dL — ABNORMAL HIGH (ref 70–99)
Glucose-Capillary: 208 mg/dL — ABNORMAL HIGH (ref 70–99)

## 2022-05-24 LAB — URINE CULTURE: Culture: NO GROWTH

## 2022-05-24 NOTE — Progress Notes (Signed)
Occupational Therapy Session Note  Patient Details  Name: Miranda Roman MRN: [id number] Date of Birth: 1946/02/09  Today's Date: 05/24/2022 OT Individual Time: 8280-0349 OT Individual Time Calculation (min): 72 min    Short Term Goals: Week 1:  OT Short Term Goal 1 (Week 1): Pt will completed UB dressing and bathing with Max A while seated on EOB. OT Short Term Goal 2 (Week 1): Pt will complete functional transfer from bed to [facility] with Max Ax1 utilizing lateral scoot technique and/or with LRAD. OT Short Term Goal 3 (Week 1): Pt will complete LB bathing task with Max A at bed level.  Skilled Therapeutic Interventions/Progress Updates:   Pt in bed upon OT and +2 TA Kayleigh arrival. Son stepped out for lunch when therapy initiated. OT had care coord with PT for OOB this session and then PT would assist back to bed later day session and pt open for plan. Pt new to this clinician and pt reports some general fear of OOB and pain. 0/10 pain at rest including R LE/UE. Initially addressed simple orientation to update board and gain sense of pt;s STM and overall recall of R sided ortho precautions with only min cues to teach back. Pt reported she was in a wet incontinence brief due to voiding herself. OT and TA assisted with doffing pants and brief encouraging pt to work on bridging with L LE and ultimately rolling L to and from R for full peri hygiene, LB bathing and redressing lower body bed level. Pt was able to assist with front peri region with L UE d/t NWB wrapped R UE. Pt with some crying out in pain even with gentle mobility bed level but continued to report 0/10 pain when asked directly. OT and TA used maxi-move for lift transfer from bed to w/c for time management and tolerance as pt had PT earlier and would be OOB to w/c from this session until later day PT. Pt was cooperative with mobility and placed safely into w/c with R LE elevated and foot plate supported with pillow prop. Pt set sink side  by OT and able to progress UB bathing, grooming, and pullover shirt dressing with modified techniques for limited R distal UE use. 0/10 pain reported. VSR: HR 78, BP 121/72  and RR 18. Dtr arrived then son and de-briefed on previous tasks. Pt and family educated on simple therex pt can perform between visits incl shoulder shrugs, shoulder blade squeezes (scap elev/dep, retrac/protraction) as well as sh flex/abd and elbow flex/ex without interfering with r UE NWB precautions. Pt issued light to med resistance foam cube for L UE for overall CDP mngt and performed L UE 2 lb weight bar for sh flex, sh abd, sh horiz abd, elbow flex 10 reps x 2 sets. Pt left up w/c level with family, safety measures and nurse call button within reach. Care coord with PT re: status and performance.    Therapy Documentation Precautions:  Precautions Precautions: Fall Precaution Comments: R knee pain - hold leg up Required Braces or Orthoses: Splint/Cast Splint/Cast: RUE and RLE Restrictions Weight Bearing Restrictions: Yes RUE Weight Bearing: Weight bear through elbow only RLE Weight Bearing: Non weight bearing Other Position/Activity Restrictions: Can bear weight through right elbow, not wrist    Therapy/Group: Individual Therapy  Barnabas Lister 05/24/2022, 7:51 AM

## 2022-05-24 NOTE — Progress Notes (Signed)
PROGRESS NOTE   Subjective/Complaints:  In bed this AM. Denies shortness of breath. No new concerns this AM.    ROS:  Pt denies SOB, abd pain, HA, Cough, CP, N/V/C/D, and vision changes   Objective:   No results found. Recent Labs    05/22/22 0631  WBC 10.3  HGB 8.9*  HCT 26.7*  PLT 314    Recent Labs    05/22/22 0631  NA 138  K 3.8  CL 112*  CO2 19*  GLUCOSE 97  BUN 33*  CREATININE 2.37*  CALCIUM 8.1*     Intake/Output Summary (Last 24 hours) at 05/24/2022 2047 Last data filed at 05/24/2022 1835 Gross per 24 hour  Intake 420 ml  Output --  Net 420 ml         Physical Exam: Vital Signs Blood pressure (!) 137/59, pulse 96, temperature 98.8 F (37.1 C), temperature source Oral, resp. rate 15, height _0  (1.626 m), weight 78.9 kg, SpO2 98 %.     General: awake, alert, appropriate,in bed covered in blankets; NAD HENT: conjugate gaze; oropharynx moist CV: regular rate; no JVD Pulmonary: CTA B/L; no W/R/R- good air movement, non-labored GI: soft, NT, ND, (+)BS Psychiatric: appropriate- quiet Neurological: slowed/delayed responses- like I normally see Musculoskeletal:     Cervical back: Normal range of motion.     Comments: Right lower leg surgical dressing in place; right forearm splint in place with ACE wrap. RUE tender to minimal palpation and movement.  Can wiggle toes on RLE- no change Skin:    General: Skin is warm and dry Neurological:     Mental Status: She is alert and oriented to person, place, and time.     Comments: Underlying cognitive impairment. Oriented to person, place, reason she's here. Fair insight and awareness. Normal speech, language. CN exam unremarkable. LUE 4/5. RUE limited by pain/ortho. Does move all muscle groups. LLE 2/5 prox to 4/5 distal. RLE tr-1/5 prox to 3/5 distal (can wiggle toes within splint). No focal sensory loss.     Assessment/Plan: 1. Functional  deficits which require 3+ hours per day of interdisciplinary therapy in a comprehensive inpatient rehab setting. Physiatrist is providing close team supervision and 24 hour management of active medical problems listed below. Physiatrist and rehab team continue to assess barriers to discharge/monitor patient progress toward functional and medical goals  Care Tool:  Bathing    Body parts bathed by patient: Face, Front perineal area, Abdomen, Chest, Right arm   Body parts bathed by helper: Buttocks, Left arm Body parts n/a: Right lower leg   Bathing assist Assist Level: Maximal Assistance - Patient 24 - 49%     Upper Body Dressing/Undressing Upper body dressing   What is the patient wearing?: Pull over shirt    Upper body assist Assist Level: Moderate Assistance - Patient 50 - 74%    Lower Body Dressing/Undressing Lower body dressing      What is the patient wearing?: Incontinence brief, Pants     Lower body assist Assist for lower body dressing: Dependent - Patient 0%     Toileting Toileting    Toileting assist Assist for toileting: 2 Helpers  Transfers Chair/bed transfer  Transfers assist  Chair/bed transfer activity did not occur: Safety/medical concerns  Chair/bed transfer assist level: 2 Helpers (slide board)     Locomotion Ambulation   Ambulation assist   Ambulation activity did not occur: Safety/medical concerns          Walk 10 feet activity   Assist  Walk 10 feet activity did not occur: Safety/medical concerns        Walk 50 feet activity   Assist Walk 50 feet with 2 turns activity did not occur: Safety/medical concerns         Walk 150 feet activity   Assist Walk 150 feet activity did not occur: Safety/medical concerns         Walk 10 feet on uneven surface  activity   Assist Walk 10 feet on uneven surfaces activity did not occur: Safety/medical concerns         Wheelchair     Assist Is the patient using a  wheelchair?: Yes Type of Wheelchair: Manual Wheelchair activity did not occur: Safety/medical concerns  Wheelchair assist level: Moderate Assistance - Patient 50 - 74% Max wheelchair distance: 44f    Wheelchair 50 feet with 2 turns activity    Assist        Assist Level: Dependent - Patient 0%   Wheelchair 150 feet activity     Assist      Assist Level: Dependent - Patient 0%    Medical Problem List and Plan: 1. Functional deficits secondary to multiple trauma after ground level fall             -patient may not yet shower             -ELOS/Goals: 12-14 days, min assist to supervision goals  Con't CIR- PT, OT and SLP  Set d/c for 12-14 days 2.  Antithrombotics: -DVT/anticoagulation:  Pharmaceutical: Lovenox 30 mg daily due to renal impairment -venous dopplers appear normal (where able to test given RLE splint)             -antiplatelet therapy: none 3. Pain Management: Tylenol, tramadol, Robaxin prn  11/14- will add Norco 5/325 mg q6 hours prn for pain and monitor cognition- cannot increase tramadol due to renal impairment- will also add Lidoderm patches 3 patches 12 hrs on; 12 hrs off- 8pm-8am  11/15- slept better due to better pain control- con't regimen per nursing, didn't get loopy with meds  11/16- pain well controlled per pt 4. Mood/Behavior/Sleep: LCSW to evaluate and provide emotional support             -antipsychotic agents: n/a 5. Neuropsych/cognition: This patient is not capable of making decisions on her own behalf. Consider SLP eval.  11/16- sent SLP eval 6. Skin/Wound Care: Routine skin care checks             -monitor surgical incision 7. Fluids/Electrolytes/Nutrition: Routine Is and Os and follow-up chemistries             -ensure adequate PO hydration 8: Right tib-fib fracture s/p ORIF 11/2 -NWB RLE  -follow-up with Dr. LMardelle Matte9: Right non-displaced wrist fracture: WBAT tolerated through elbow             -maintain wrist splint              -follow-up with Dr. 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  11/16- will stop IVFs- back to baseline 13: Hyperlipidemia: continue Lipitor 14: DM2: CBGs 4 times daily             -continue Levemir 10 units daily             -continue sliding scale             -consider restarting home insulin 4 units with meals  11/14- will monitor trend- is 91-223 in last 24 hours, but give 1 more day before adding insulin  11/15- Had only 1 spike in last 24 hours- will monitor  11/17- CBGs looking great- con't regimen 15: Constipation: continue senna and Colace daily; prns 16: Anemia; multi-factorial: follow-up CBC 17: Leukocytosis/low grade fever: abx for pneumonia continue  11/14- WBC down to 15k from 16.4- on Rocephin  11/15- recheck in AM  11/16- WBC 10.3-  18: AKI: in setting of CKD4 serum creatinine improving, still elevated (baseline 2.3-2.6)             -follow-up on BMP  11/14- Cr down to 2.74 from 3.04- so heading in right direction   11/15- recheck in AM  11/16- Cr 2.37- will stop IVFs since at baseline 19. Confusion  11/16- will check U/A and Cx since family says confusion worse/not at baseline- esp since flooding bed multiple times per day  11/17- U/A not back- will order again just to make sure  -spoke with charge nurse- they can cath her if need be to get specimen.   -11/18 Urine culture with no growth    LOS: 5 days A FACE TO FACE EVALUATION WAS PERFORMED  Jennye Boroughs 05/24/2022, 8:47 PM

## 2022-05-24 NOTE — Progress Notes (Signed)
Physical Therapy Session Note  Patient Details  Name: Miranda Roman MRN: [id number] Date of Birth: Nov 20, 1945  Today's Date: 05/24/2022 PT Individual Time: 0920-1019 and 7619-5093 PT Individual Time Calculation (min): 59 min and 58 min  Short Term Goals: Week 1:  PT Short Term Goal 1 (Week 1): Pt will perform STS +1 assist PT Short Term Goal 2 (Week 1): Pt will tolerate sitting OOB >1 hour between sessions PT Short Term Goal 3 (Week 1): Pt will perform least restrictive transfer with max A  Skilled Therapeutic Interventions/Progress Updates:    Session 1: Pt received supine in bed with her son, [name], present and pt agreeable to therapy session. Nurse notified for medication administration.  Reviewed R LE and R UE WBing restrictions - pt able to recall with question cuing.  Supine in bed, threaded on pants with total assist, requiring total assist to lift R LE with pt reporting the splint feeling really heavy limiting her ability to lift her LE. Rolling R with min assist and rolling L with max assist using bedrails with cuing for sequencing to increase pt independence and total assist manual facilitation to manage R LE movements. Pulled pants up over hips dependently.  Performed the following supine exercises:  L LE, x10reps of each: - SLR, through decreased ROM - heel slides, pain limiting full knee flexion  R LE:  - hip flexion active assist while avoiding knee flexion, for pain management x10reps  - hip abduction/adduction AROM active assist x10 reps - heel slides with active assist and pt tolerating progression to ~30degreees of knee flexion  Supine>sitting L EOB with heavy +2 max assist to bring trunk upright, manage R LE, and rotate pelvis - therapist providing step-by-step cuing for sequencing as pt very cautious/hesitant to move due to pt being highly pain adverse - requires total assist to manage R LE movement to EOB and pt with 1x vocalizing pain.  Once sitting upright,  pt with persistent L posterior lean requiring mod/max assist to maintain upright - attempted reaching with R UE forwards towards R to achieve midline, but this only improved once therapist provided pillows for pt to prop R elbow on to promote R lateral trunk lean; however, still requires intermittent min assist to maintain upright. Tolerated sitting EOB ~71mnutes during session.  Sitting EOB, performed L LE long arc quads and active assist R LE short arc quads x10 reps each - pt continues to remain very hesitant to move R LE requiring max encouragement from therapist and son - only moves through ~10degrees of knee flexion to full extension with active assist.  Returned to supine via reverse logroll with +2 max assist for trunk descent and B LE management onto bed with continued step-by-step cuing for sequencing to allow pt increased participation as she has less pain when she moves - again pt with 1x vocalizing out in pain.  Placed bed in trendelenburg and pt able to scoot towards HOB using L UE and L LE with min assist.  Pt left supine in bed with needs in reach, R LE supported on a pillow, and her son present.   Session 2: Pt received sitting in w/c with her daughter, LMarliss Czar present and pt agreeable to therapy session. Pt appears in better spirits this afternoon.  Transported to gym in w/c for time management and energy conservation.   R lateral scoot transfer w/c>EOM with dependent assist to place transfer board and +2 max assist for scooting - therapist providing step-by-step cuing for  sequencing head/hips relationship to allow board placement and for scooting, therapist placing her foot underneath pt's R LE to keep it extended and monitor compliance with NWBing (pt does well with this), therapist cuing pt to hold her R hand to her chest to avoid temptation to break precautions, cuing for anterior trunk lean and pushing through her L UE and LE to assist with scooting.  Pt able to maintain sitting  balance EOM with supervision for safety during session.  Sit>stand elevated EOM>R platform RW (R PFRW) x3 with +2 max assist for lifting to stand - therapist continuing to keep her foot underneath pt's R foot to keep it forward, extended out from under her BOS and to monitor NWB compliance, cuing to only push through R elbow on platform pad, and therapist facilitating anterior weight shifting and coming to stand. With each stand, pt has slightly improved upright posture with gradually increasing trunk/hip/knee extension although still with flexed/crouched posture.  L lateral scoot transfer EOM>w/c, slightly downhill, with therapist facilitating R lateral trunk lean onto R elbow while +2 provided dependent assist to place transfer board - continued requiring cuing for increasing anterior trunk lean, head/hips relationship, maintaining R LE NWBing, and keeping R hand to her chest, while cuing to push through L LE to lift hips and pull with L UE on w/c armrest to slide her hips - requires +2 mod/max assist (improved since going downhill).  Donned L shoe for traction. L LE w/c propulsion ~56f with min progressed to mod assist to initiate the forward momentum - cuing throughout to sustain forward propulsion to avoid having to re-start after ever pull with her L LE. Transported remainder of distance back to her room dependently.  R lateral scoot transfer w/c>EOB with +2 max assist as described above; however, pt having improved initiation of movements and improving motor planning on head/hips relationship and how to utilize L hemibody to complete the transfer.  Sit>supine with +2 mod/max assist for trunk descent and R LE management onto bed. Pt left supine in bed with needs in reach, bed alarm on, and her daughter present.   Therapy Documentation Precautions:  Precautions Precautions: Fall Precaution Comments: R knee pain - hold leg up Required Braces or Orthoses: Splint/Cast Splint/Cast: RUE and  RLE Restrictions Weight Bearing Restrictions: Yes RUE Weight Bearing: Weight bear through elbow only RLE Weight Bearing: Non weight bearing Other Position/Activity Restrictions: Can bear weight through right elbow, not wrist   Pain: Session 1: Pt with pain in R LE and R wrist, unrated, nurse present at beginning of session for medication administration - provided emotional support, distraction, and slow, gentle movements for pain management.  Session 2: At end of session, pt states "not too much" pain during session - therapist provides emotional support, education to pt on importance of movement, relaxation, and repositioning interventions for pain management.   Therapy/Group: Individual Therapy  CTawana Scale, PT, DPT, NCS, CSRS 05/24/2022, 7:50 AM

## 2022-05-25 DIAGNOSIS — Z794 Long term (current) use of insulin: Secondary | ICD-10-CM

## 2022-05-25 DIAGNOSIS — N184 Chronic kidney disease, stage 4 (severe): Secondary | ICD-10-CM

## 2022-05-25 DIAGNOSIS — E1122 Type 2 diabetes mellitus with diabetic chronic kidney disease: Secondary | ICD-10-CM

## 2022-05-25 LAB — GLUCOSE, CAPILLARY
Glucose-Capillary: 77 mg/dL (ref 70–99)
Glucose-Capillary: 135 mg/dL — ABNORMAL HIGH (ref 70–99)
Glucose-Capillary: 100 mg/dL — ABNORMAL HIGH (ref 70–99)
Glucose-Capillary: 221 mg/dL — ABNORMAL HIGH (ref 70–99)

## 2022-05-25 NOTE — Progress Notes (Signed)
Occupational Therapy Session Note  Patient Details  Name: Miranda Roman MRN: [id number] Date of Birth: 04/23/1946  Today's Date: 05/25/2022 OT Individual Time: 1400-1425 OT Individual Time Calculation (min): 25 min    Short Term Goals: Week 1:  OT Short Term Goal 1 (Week 1): Pt will completed UB dressing and bathing with Max A while seated on EOB. OT Short Term Goal 2 (Week 1): Pt will complete functional transfer from bed to [facility] with Max Ax1 utilizing lateral scoot technique and/or with LRAD. OT Short Term Goal 3 (Week 1): Pt will complete LB bathing task with Max A at bed level.  Skilled Therapeutic Interventions/Progress Updates:    Pt resting in bed upon arrival with son present. OT intervention with focus on LUE therex and RUE shoulder AROM. LUE exercises with 1# barbell: chest presses 3x10, overhead presses 3x12, shoulder horizontal abduction/adduction3x10. Biceps curls with yellow theraband 3x12. Pt remained in bed with bed alarm activated. All needs within reach.   Therapy Documentation Precautions:  Precautions Precautions: Fall Precaution Comments: R knee pain - hold leg up Required Braces or Orthoses: Splint/Cast Splint/Cast: RUE and RLE Restrictions Weight Bearing Restrictions: Yes RUE Weight Bearing: Weight bear through elbow only RLE Weight Bearing: Non weight bearing Other Position/Activity Restrictions: Can bear weight through right elbow, not wrist  Pain: Pain Assessment Pain Scale: 0-10 Pain Score: 0-No pain  Therapy/Group: Individual Therapy  [name] 05/25/2022, 2:32 PM

## 2022-05-25 NOTE — Progress Notes (Signed)
Physical Therapy Session Note  Patient Details  Name: Miranda Roman MRN: [id number] Date of Birth: Jun 21, 1946  Today's Date: 05/25/2022 PT Individual Time: 0800-0915 PT Individual Time Calculation (min): 75 min   Short Term Goals: Week 1:  PT Short Term Goal 1 (Week 1): Pt will perform STS +1 assist PT Short Term Goal 2 (Week 1): Pt will tolerate sitting OOB >1 hour between sessions PT Short Term Goal 3 (Week 1): Pt will perform least restrictive transfer with max A  Skilled Therapeutic Interventions/Progress Updates:    pt received in bed and agreeable to therapy. Pt reports no pain at rest but 5/10 with mobility, RN provided medication during session, Rest and positioning provided as needed. Noted brief to be soiled when removing purewick, so dependent brief change and donned pants tot A with min a to roll R and max to roll L. Pt able to partially bridge to pull pants over hips. Supine>sit with max A for trunk elevation and increased time to move BLE with assist. Pt able to sit EOB with supervision, but requires max A to scoot to EOB. Pt performed Sit to stand 2 x 3 from elevated bed to [place] with max A x 1 and CGA-mod A from +2 depending on fatigue. Pt with difficulty maintain WB precautions with RLE propped on therapist's foot. Frequent cueing for hip and trunk extension. Pt reports tailbone pain when sitting up previously, so retrieved hybrid roho cushion for improved comfort and skin protection. slideboard transfer to w/c with max x 1 with +2 providing min-CGA. Pt then propels w/c with LLE and min A 2 x 50 ft for endurance and functional mobility. Pt remained in w/c at end of session and was left with all needs in reach and alarm active.   Therapy Documentation Precautions:  Precautions Precautions: Fall Precaution Comments: R knee pain - hold leg up Required Braces or Orthoses: Splint/Cast Splint/Cast: RUE and RLE Restrictions Weight Bearing Restrictions: Yes RUE Weight Bearing:  Weight bear through elbow only RLE Weight Bearing: Non weight bearing Other Position/Activity Restrictions: Can bear weight through right elbow, not wrist General:       Therapy/Group: Individual Therapy  Mickel Fuchs 05/25/2022, 12:15 PM

## 2022-05-25 NOTE — Progress Notes (Signed)
Occupational Therapy Session Note  Patient Details  Name: Miranda Roman MRN: [id number] Date of Birth: 12/20/1945  Today's Date: 05/25/2022 OT Individual Time: 1100-1155 OT Individual Time Calculation (min): 55 min    Short Term Goals: Week 1:  OT Short Term Goal 1 (Week 1): Pt will completed UB dressing and bathing with Max A while seated on EOB. OT Short Term Goal 2 (Week 1): Pt will complete functional transfer from bed to [facility] with Max Ax1 utilizing lateral scoot technique and/or with LRAD. OT Short Term Goal 3 (Week 1): Pt will complete LB bathing task with Max A at bed level.  Skilled Therapeutic Interventions/Progress Updates:    Pt resting in w/c upon arrival and requesting to return to bed. SB transfer with tot A+2. Pt fatigued and unable to assist. Pt assisted with repositioning in bed by bridging with LLE. BUE therex with 2# barbell in supine: chest presses, straight arm raises, and chest flys-3/10. Yellow theraband for biceps curls and internal rotation 3x10. RUE shoulder AROM to maintain mobility. Pt reamined in bed with all needs within reach. Son present and bed alarm activated,  Therapy Documentation Precautions:  Precautions Precautions: Fall Precaution Comments: R knee pain - hold leg up Required Braces or Orthoses: Splint/Cast Splint/Cast: RUE and RLE Restrictions Weight Bearing Restrictions: Yes RUE Weight Bearing: Weight bear through elbow only RLE Weight Bearing: Non weight bearing Other Position/Activity Restrictions: Can bear weight through right elbow, not wrist  Pain:  Pt reports 0/10 pain at rest and 10/10 pain in RUE/RLE with transitional movements; repositioned and emotional support   Therapy/Group: Individual Therapy  [name] 05/25/2022, 12:22 PM

## 2022-05-25 NOTE — Progress Notes (Signed)
Physical Therapy Session Note  Patient Details  Name: Miranda Roman MRN: [id number] Date of Birth: July 29, 1945  Today's Date: 05/25/2022 PT Individual Time: 1445-1530 PT Individual Time Calculation (min): 45 min   Short Term Goals: Week 1:  PT Short Term Goal 1 (Week 1): Pt will perform STS +1 assist PT Short Term Goal 2 (Week 1): Pt will tolerate sitting OOB >1 hour between sessions PT Short Term Goal 3 (Week 1): Pt will perform least restrictive transfer with max A  Skilled Therapeutic Interventions/Progress Updates: Pt presented in bed agreeable to therapy. Pt denies pain at rest but does denote pain with mobility. Pt performed AA heel slides x 10, AA hip abd/add, AA SLR x 10 each. With [facility] elevated pt was able to push self into long sit maintaining UE wt bearing precautions. Pt was able to initiate BLE mobility towards [facility] however required assist and increased time to complete moving legs off bed and ultimately required maxA to complete transfer to sitting EOB. PTA providing max facilitation to perform hip "scoots" towards EOB with pt demonstrating difficulty motor planning. At EOB pt initially with posterior lean however was able to correct with tactile cues and with increased time did not demonstrate any posterior lean at all. At EOB pt participated in x 3 attempts to stand. Pt requiring modA x 2 to complete all three trials with foot placed under pt's RLE to avoid placing weight thorough extremity.  Pt noted to maintain L knee flexed 2/3 times. On third trial PTA tapping R quad to facilitate quad activation as well as providing verbal cues "push leg into bed" which cued pt to achieve improved extension of leg. Once completed pt was able to lean into R elbow and required minA to manage LLE onto bed and maxA for RLE. Pt was able to complete transfer with modA x 2 to supine. Pt repositioned to comfort and left with bed alarm on, call bell within reach and needs met.      Therapy  Documentation Precautions:  Precautions Precautions: Fall Precaution Comments: R knee pain - hold leg up Required Braces or Orthoses: Splint/Cast Splint/Cast: RUE and RLE Restrictions Weight Bearing Restrictions: Yes RUE Weight Bearing: Weight bear through elbow only RLE Weight Bearing: Non weight bearing Other Position/Activity Restrictions: Can bear weight through right elbow, not wrist General:   Vital Signs:  Pain: Pain Assessment Pain Scale: 0-10 Pain Score: 0-No pain Mobility:   Locomotion :    Trunk/Postural Assessment :    Balance:   Exercises:   Other Treatments:      Therapy/Group: Individual Therapy  Demarious Kapur 05/25/2022, 3:58 PM

## 2022-05-25 NOTE — Progress Notes (Signed)
PROGRESS NOTE   Subjective/Complaints:  No new complaints or concerns this AM.    ROS:  Pt denies SOB, abd pain, HA, Cough, CP, N/V/C/D, rash and vision changes   Objective:   No results found. No results for input(s): "WBC", "HGB", "HCT", "PLT" in the last 72 hours.  No results for input(s): "NA", "K", "CL", "CO2", "GLUCOSE", "BUN", "CREATININE", "CALCIUM" in the last 72 hours.   Intake/Output Summary (Last 24 hours) at 05/25/2022 1236 Last data filed at 05/25/2022 0715 Gross per 24 hour  Intake 420 ml  Output 900 ml  Net -480 ml         Physical Exam: Vital Signs Blood pressure (!) 154/72, pulse 85, temperature 99 F (37.2 C), temperature source Oral, resp. rate 16, height _0  (1.626 m), weight 78.9 kg, SpO2 97 %.     General: awake, alert, appropriate,in bed; NAD HENT: conjugate gaze; oropharynx moist CV: regular rate; no JVD Pulmonary: CTA B/L; no W/R/R- good air movement, non-labored GI: soft, NT, ND, (+)BS Psychiatric: appropriate- pleasant Neurological: slowed/delayed responses- like I normally see Musculoskeletal:     Cervical back: Normal range of motion.     Comments: Right lower leg surgical dressing in place; right forearm splint in place with ACE wrap. RUE tender to minimal palpation and movement.  Can wiggle toes on RLE- no change Skin:    General: Skin is warm and dry Neurological:     Mental Status: She is alert and oriented to person, place, and time.     Comments: Underlying cognitive impairment. Oriented to person, place, reason she's here. Fair insight and awareness. Normal speech, language. CN exam unremarkable. LUE 4/5. RUE limited by pain/ortho. Does move all muscle groups. LLE 2/5 prox to 4/5 distal. RLE tr-1/5 prox to 3/5 distal (can wiggle toes within splint). No focal sensory loss.     Assessment/Plan: 1. Functional deficits which require 3+ hours per day of  interdisciplinary therapy in a comprehensive inpatient rehab setting. Physiatrist is providing close team supervision and 24 hour management of active medical problems listed below. Physiatrist and rehab team continue to assess barriers to discharge/monitor patient progress toward functional and medical goals  Care Tool:  Bathing    Body parts bathed by patient: Face, Front perineal area, Abdomen, Chest, Right arm   Body parts bathed by helper: Buttocks, Left arm Body parts n/a: Right lower leg   Bathing assist Assist Level: Maximal Assistance - Patient 24 - 49%     Upper Body Dressing/Undressing Upper body dressing   What is the patient wearing?: Pull over shirt    Upper body assist Assist Level: Moderate Assistance - Patient 50 - 74%    Lower Body Dressing/Undressing Lower body dressing      What is the patient wearing?: Incontinence brief, Pants     Lower body assist Assist for lower body dressing: Dependent - Patient 0%     Toileting Toileting    Toileting assist Assist for toileting: 2 Helpers     Transfers Chair/bed transfer  Transfers assist  Chair/bed transfer activity did not occur: Safety/medical concerns  Chair/bed transfer assist level: 2 Helpers (slide board)     Locomotion  Ambulation   Ambulation assist   Ambulation activity did not occur: Safety/medical concerns          Walk 10 feet activity   Assist  Walk 10 feet activity did not occur: Safety/medical concerns        Walk 50 feet activity   Assist Walk 50 feet with 2 turns activity did not occur: Safety/medical concerns         Walk 150 feet activity   Assist Walk 150 feet activity did not occur: Safety/medical concerns         Walk 10 feet on uneven surface  activity   Assist Walk 10 feet on uneven surfaces activity did not occur: Safety/medical concerns         Wheelchair     Assist Is the patient using a wheelchair?: Yes Type of Wheelchair:  Manual Wheelchair activity did not occur: Safety/medical concerns  Wheelchair assist level: Moderate Assistance - Patient 50 - 74% Max wheelchair distance: 77f    Wheelchair 50 feet with 2 turns activity    Assist        Assist Level: Dependent - Patient 0%   Wheelchair 150 feet activity     Assist      Assist Level: Dependent - Patient 0%    Medical Problem List and Plan: 1. Functional deficits secondary to multiple trauma after ground level fall             -patient may not yet shower             -ELOS/Goals: 12-14 days, min assist to supervision goals  Con't CIR- PT, OT and SLP  Set d/c for 12-14 days 2.  Antithrombotics: -DVT/anticoagulation:  Pharmaceutical: Lovenox 30 mg daily due to renal impairment -venous dopplers appear normal (where able to test given RLE splint)             -antiplatelet therapy: none 3. Pain Management: Tylenol, tramadol, Robaxin prn  11/14- will add Norco 5/325 mg q6 hours prn for pain and monitor cognition- cannot increase tramadol due to renal impairment- will also add Lidoderm patches 3 patches 12 hrs on; 12 hrs off- 8pm-8am  11/15- slept better due to better pain control- con't regimen per nursing, didn't get loopy with meds  11/16- pain well controlled per pt 4. Mood/Behavior/Sleep: LCSW to evaluate and provide emotional support             -antipsychotic agents: n/a 5. Neuropsych/cognition: This patient is not capable of making decisions on her own behalf. Consider SLP eval.  11/16- sent SLP eval 6. Skin/Wound Care: Routine skin care checks             -monitor surgical incision 7. Fluids/Electrolytes/Nutrition: Routine Is and Os and follow-up chemistries             -ensure adequate PO hydration 8: Right tib-fib fracture s/p ORIF 11/2 -NWB RLE  -follow-up with Dr. LMardelle [id number]: Right non-displaced wrist fracture: WBAT tolerated through elbow             -maintain wrist splint             -follow-up with Dr. 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  11/16- will stop IVFs- back to baseline 13: Hyperlipidemia: continue Lipitor 14: DM2: CBGs  4 times daily             -continue Levemir 10 units daily             -continue sliding scale             -consider restarting home insulin 4 units with meals  11/14- will monitor trend- is 91-223 in last 24 hours, but give 1 more day before adding insulin  11/15- Had only 1 spike in last 24 hours- will monitor  11/17- CBGs looking great- con't regimen  11/18 one elevated CBG of 208, overall well controlled, continue to monitor 15: Constipation: continue senna and Colace daily; prns 16: Anemia; multi-factorial: follow-up CBC 17: Leukocytosis/low grade fever: abx for pneumonia continue  11/14- WBC down to 15k from 16.4- on Rocephin  11/15- recheck in AM  11/16- WBC 10.3-  18: AKI: in setting of CKD4 serum creatinine improving, still elevated (baseline 2.3-2.6)             -follow-up on BMP  11/14- Cr down to 2.74 from 3.04- so heading in right direction   11/15- recheck in AM  11/16- Cr 2.37- will stop IVFs since at baseline  Recheck tomorrow 19. Confusion  11/16- will check U/A and Cx since family says confusion worse/not at baseline- esp since flooding bed multiple times per day  11/17- U/A not back- will order again just to make sure  -spoke with charge nurse- they can cath her if need be to get specimen.   -11/18 Urine culture with no growth    LOS: 6 days A FACE TO FACE EVALUATION WAS PERFORMED  Jennye Boroughs 05/25/2022, 12:36 PM

## 2022-05-26 DIAGNOSIS — F09 Unspecified mental disorder due to known physiological condition: Secondary | ICD-10-CM

## 2022-05-26 LAB — BASIC METABOLIC PANEL
Anion gap: 9 (ref 5–15)
BUN: 37 mg/dL — ABNORMAL HIGH (ref 8–23)
CO2: 21 mmol/L — ABNORMAL LOW (ref 22–32)
Calcium: 8.2 mg/dL — ABNORMAL LOW (ref 8.9–10.3)
Chloride: 109 mmol/L (ref 98–111)
Creatinine, Ser: 2.6 mg/dL — ABNORMAL HIGH (ref 0.44–1.00)
GFR, Estimated: 19 mL/min — ABNORMAL LOW (ref >60)
Glucose, Bld: 87 mg/dL (ref 70–99)
Potassium: 4.1 mmol/L (ref 3.5–5.1)
Sodium: 139 mmol/L (ref 135–145)

## 2022-05-26 LAB — GLUCOSE, CAPILLARY
Glucose-Capillary: 82 mg/dL (ref 70–99)
Glucose-Capillary: 252 mg/dL — ABNORMAL HIGH (ref 70–99)
Glucose-Capillary: 47 mg/dL — ABNORMAL LOW (ref 70–99)
Glucose-Capillary: 117 mg/dL — ABNORMAL HIGH (ref 70–99)

## 2022-05-26 LAB — CBC
HCT: 27.4 % — ABNORMAL LOW (ref 36.0–46.0)
Hemoglobin: 8.8 g/dL — ABNORMAL LOW (ref 12.0–15.0)
MCH: 25.4 pg — ABNORMAL LOW (ref 26.0–34.0)
MCHC: 32.1 g/dL (ref 30.0–36.0)
MCV: 79.2 fL — ABNORMAL LOW (ref 80.0–100.0)
Platelets: 470 10*3/uL — ABNORMAL HIGH (ref 150–400)
RBC: 3.46 MIL/uL — ABNORMAL LOW (ref 3.87–5.11)
RDW: 18.9 % — ABNORMAL HIGH (ref 11.5–15.5)
WBC: 8.1 10*3/uL (ref 4.0–10.5)
nRBC: 0 % (ref 0.0–0.2)

## 2022-05-26 MED ORDER — LORATADINE 10 MG PO TABS: 10.0000 mg | ORAL_TABLET | Freq: Every day | ORAL | Status: DC

## 2022-05-26 MED ORDER — LINAGLIPTIN 5 MG PO TABS
5.0000 mg | ORAL_TABLET | Freq: Every day | ORAL | Status: DC
  Administered 2022-05-26 – 2022-05-27 (×2): 5 mg via ORAL
  Filled 2022-05-26 (×2): qty 1

## 2022-05-26 MED ORDER — CETIRIZINE HCL 10 MG PO TABS
10.0000 mg | ORAL_TABLET | Freq: Every day | ORAL | Status: AC
  Administered 2022-05-26 – 2022-06-01 (×7): 10 mg via ORAL
  Filled 2022-05-26 (×7): qty 1

## 2022-05-26 MED ORDER — NON FORMULARY: Freq: Every day | Status: DC

## 2022-05-26 MED ORDER — NON FORMULARY: 10.0000 mg | Freq: Every day | Status: DC

## 2022-05-26 NOTE — Consult Note (Signed)
Neuropsychological Consultation   Patient:   Miranda Roman   DOB:   Sep 19, 1945  MR Number:  [id number]  Location:  [facility] [address] [facility] [address] [place] [id number] [place] [address] Dept: [place]: [phone]           Date of Service:   05/26/2022  Start Time:   8 AM End Time:   9 AM  Provider/Observer:  Ilean Skill, Psy.D.       Clinical Neuropsychologist       Billing Code/Service: 53614  Chief Complaint:    Miranda Roman is a 76 year old female referred for neuropsychological consultation due to recovering status orthopedic injury with baseline mild cognitive dysfunction.  The patient was brought to the emergency department via EMS from her home after mechanical fall on 05/08/2022.  Patient with physical deformity to the right lower extremity and right wrist.  Patient had been using a rolling walker prior to fall.  She reportedly tripped over the family dog.  Patient with past medical history including diabetes, hypertension and chronic kidney disease stage IV.  Patient had a nondisplaced right distal ulnar/radius fracture as well as a right tib-fib fracture.  On 11/4 patient taken to operating room for ORIF with IM nailing fixation of right tib-fib fracture and closed management of right distal radius and ulnar fracture.  Patient developed acute kidney injury as well as fever with worsening leukocytosis on 11/9.  Patient started on IV antibiotics.  Patient is nonweightbearing in the right lower extremity.  Patient had previous neuropsychological assessment performed in July of this year by Dr. Melvyn Novas at the request of her neurologist Dr. Tomi Likens.  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  Patient's son was rather concerned feel like his mother was displaying increasing symptoms of depression with frustration over long hospital stay and inability to go home at this point.  Reason for Service:  Patient was referred for neuropsychological consultation due to underlying cognitive difficulties as well as coping and adjustment issues with extended hospital stay after mechanical fall with tib-fib fracture.  Below is the HPI for the current admission.  HPI: Miranda Roman. Holdman is a right-handed 76 year old female brought to the emergency department via EMS from home for mechanical fall on 05/08/2022.Miranda Roman  She had physical deformity to the right lower extremity and right wrist.  She had recently moved in with her daughter and typically ambulates with a rolling walker.  She tripped over the family dogs and was complaining of right wrist and right proximal lower leg pain.  Medical history is significant for diabetes mellitus, hypertension and chronic kidney disease stage IV.  Initial imaging revealed no spinal deformity.  Positive for nondisplaced right distal ulnar/radius fracture.  Dr. Greta Doom consulted and fracture is nonoperable and patient placed in splint.  She was admitted and orthopedic surgery consultation obtained.  Imaging revealed right tib-fib fracture and she was placed into a long-leg splint.  On 11/4, she was taken to the operating room by Dr. Mardelle Matte and underwent ORIF with IM nail fixation of right tib-fib fracture and closed management of right distal radius and ulnar fracture.  She developed acute kidney injury with serum creatinine elevated  to 3.5.  IVFs continued. Nephrology was consulted on 11/11.  She developed fever with worsening leukocytosis on 11/9.  Urine culture and chest x-ray ordered.  She complained of abdominal pain 11/10 and CT of abdomen pelvis performed. No acute abnormality. Fever worsened to  103.1 on 11/10 and she was tachycardic with increased respiratory rate and desaturating.  Blood cultures obtained.  Empirically started on IV Rocephin and IV azithromycin.  She is nonweightbearing in the right lower extremity and weightbearing as tolerated to the elbow of the right upper extremity.The patient requires inpatient physical medicine and rehabilitation evaluations and treatment secondary to dysfunction due to fall with RLE and RUE fractures.   Current Status:  As I approached the patient's room her son came out of the patient's room to speak with me before I entered.  Her son verbalized his concerns around his mother's mood status stating he felt that she was becoming increasingly depressed.  Son acknowledged ongoing mild cognitive difficulties since 2021.  Patient was laying with head slightly elevated in her bed.  Patient was oriented but only supplied limited information about her acute status in the hospital.  Patient stayed rather focus on her frustration with being in the hospital for extended stay and her desire to be discharged soon as possible.  Patient acknowledged that she continues to have difficulty with symptoms such as executive functioning, information processing speed and some memory limitations that have persisted over the past several years.  Patient reports that she is frustrated that she may not be going home during the holidays with an expected discharge date in 7 days.  Patient displayed slowed speech patterns but did remain oriented throughout.  She displayed the ability to learn new information that was very pertinent to her.  Behavioral Observation: Miranda Roman  presents as a 76 y.o.-year-old Right handed African American Female who appeared her stated age. her dress was Appropriate and she was Well Groomed and her manners were Appropriate to the situation.  her participation was indicative of Appropriate and Redirectable behaviors.  There were physical disabilities  noted.  she displayed an appropriate level of cooperation and motivation.     Interactions:    Active Appropriate  Attention:   abnormal and attention span appeared shorter than expected for age  Memory:   abnormal; remote memory intact, recent memory impaired  Visuo-spatial:  not examined  Speech (Volume):  low  Speech:   normal; slowed responses  Thought Process:  Coherent and Relevant  Though Content:  WNL; not suicidal and not homicidal  Orientation:   person, place, time/date, and situation  Judgment:   Fair  Planning:   Poor  Affect:    Depressed, Flat, and Lethargic  Mood:    Dysphoric  Insight:   Fair  Intelligence:   very high   Medical History:   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   Borderline steroid-induced glaucoma, 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   OD, Early elevated intraocular pressure to 28  mm we will discontinue all topical medications from surgery in the right eye today   Bronchiectasis without complication 90/24/0973   Cataract 07/03/2020   Chest pain at rest 01/30/2015   Chronic kidney disease, stage 4 (severe) 12/07/2020   Closed fracture of right tibial plateau 07/17/2021   Cyst of left kidney 03/10/2019   Needs repeated US 08/2019   Dysphagia, neurologic    Elevated d-dimer 12/20/2019   Facet arthritis of lumbar region 02/27/2019   Gastroesophageal reflux disease 08/26/2016   History of gastric bypass  08/14/2009   Hyperglycemia due to type 2 diabetes mellitus 12/07/2020   Hyperlipidemia    Hypertensive nephropathy 10/15/2017   Inadequate oral nutritional intake    Leukocytosis 12/22/2019   Long term (current) use of insulin 12/07/2020   Low grade squamous intraepithelial lesion (LGSIL) on cervicovaginal cytologic smear 12/15/2018   Lumbago 02/27/2019   Macular atrophy, 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  Further history patient suffered possible Wernicke's encephalopathy the past, a type of the vitamin deficiencies, thiamine that can also affect the retina if there is significant damage which may not be recoverable.   Mild neurocognitive disorder due to multiple etiologies 01/23/2022   Osteoporosis 12/15/2018   Overweight with body mass index (BMI) 25.0-29.9 12/15/2018   PAD (peripheral artery disease) 06/06/2019   Pain in right hand 04/27/2020   Pancreatic cyst 06/06/2019   Polyneuropathy due to type 2 diabetes mellitus    Posterior vitreous detachment of left eye 08/05/2021   Proliferative diabetic retinopathy of right eye 08/22/2021   Likely contributory cause of nonclearing vitreous hemorrhage in the right eye now status post PRP likely to resolve in 2 quiescent PDR, post vitrectomy PRP   Pulmonary hypertension 02/04/2021   Right knee pain 07/10/2021   Sickle-cell trait 12/07/2020   Type II diabetes mellitus 03/10/2012   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   Vitreous hemorrhage of right eye 08/05/2021   Vitrectomy PRP 08-14-2021         Patient Active Problem List   Diagnosis Date Noted   Right tibial fracture 05/19/2022   Critical polytrauma 05/19/2022   Postoperative anemia due to acute blood loss 05/17/2022   PNA (pneumonia) 05/16/2022   Acute renal failure superimposed on stage 4 chronic kidney disease ([place]) 05/14/2022   Hypertension 05/14/2022   Fracture  05/08/2022   Tibia/fibula fracture, right, closed, initial encounter 05/08/2022   Right radial fracture 05/08/2022   Right distal ulnar fracture 05/08/2022   Left wrist pain 05/08/2022   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   Mild neurocognitive disorder due to multiple etiologies 01/23/2022   Polyneuropathy due to type 2 diabetes mellitus    Anemia of chronic renal failure, stage 4 (severe) 01/04/2022   Proliferative diabetic retinopathy of right eye 08/22/2021   Borderline steroid-induced glaucoma, 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   Macular atrophy, retinal 08/22/2021   Posterior vitreous detachment of left eye 08/05/2021   Vitreous hemorrhage of right eye 08/05/2021   Right knee pain 07/10/2021   Pulmonary hypertension 02/04/2021   Bronchiectasis without complication 53/29/9242   Chronic kidney disease, stage 4 (severe) 12/07/2020   Hyperglycemia due to type 2 diabetes mellitus 12/07/2020   Long term (current) use of insulin 12/07/2020   Sickle-cell trait 12/07/2020   Cataract 07/03/2020   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   Pain in right hand 04/27/2020   Dysphagia, neurologic    Inadequate oral nutritional intake  Altered mental status    Leukocytosis 12/22/2019   Elevated d-dimer 12/20/2019   PAD (peripheral artery disease) 06/06/2019   Pancreatic cyst 06/06/2019   Cyst of left kidney 03/10/2019   Lumbago 02/27/2019   Facet arthritis of lumbar region 02/27/2019   Abnormal CT of the chest 12/28/2018   Atrophic vaginitis 12/15/2018   Atypical squamous cells of undetermined significance on cytologic smear of cervix (ASC-US) 12/15/2018   Overweight with body mass index (BMI) 25.0-29.9 12/15/2018   Low grade squamous intraepithelial lesion (LGSIL) on cervicovaginal cytologic smear 12/15/2018   Osteoporosis 12/15/2018   Unspecified dyspareunia (CODE) 12/15/2018   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   Hypertensive nephropathy 10/15/2017   Gastroesophageal reflux disease 08/26/2016   Aortic valve  regurgitation 02/11/2016   Chest pain at rest 01/30/2015   Atherosclerosis of native coronary artery of native heart without angina pectoris 12/24/2012   Benign essential hypertension 12/24/2012   Hyperlipidemia 12/24/2012   Type II diabetes mellitus 03/10/2012   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    Psychiatric History:  No prior psychiatric history noted  Family Med/Psych History:  Family History  Problem Relation Age of Onset   Diabetes Mother    Heart disease Mother    Hypertension Mother    Stroke Mother    Diabetes Father    Heart disease Father    Diabetes Sister    Diabetes Brother    Hypertension Brother    Sickle cell anemia Brother    Diabetes Maternal Aunt    Diabetes Maternal Uncle    Diabetes Maternal Grandmother    Diabetes Maternal Grandfather    Sickle cell anemia Sister    Sickle cell anemia Sister    Healthy Son    Healthy Son    Healthy Daughter     Impression/DX:  JANELLE SPELLMAN is a 76 year old female referred for neuropsychological consultation due to recovering status orthopedic injury with baseline mild cognitive dysfunction.  The patient was brought to the emergency department via EMS from her home after mechanical fall on 05/08/2022.  Patient with physical deformity to the right lower extremity and right wrist.  Patient had been using a rolling walker prior to fall.  She reportedly tripped over the family dog.  Patient with past medical history including diabetes, hypertension and chronic kidney disease stage IV.  Patient had a nondisplaced right distal ulnar/radius fracture as well as a right tib-fib fracture.  On 11/4 patient taken to operating room for ORIF with IM nailing fixation of right tib-fib fracture and closed management of right distal radius and ulnar fracture.  Patient developed acute kidney injury as well as fever with worsening leukocytosis on 11/9.  Patient started on IV antibiotics.  Patient is nonweightbearing in the right lower  extremity.  Patient had previous neuropsychological assessment performed in July of this year by Dr. Melvyn Novas at the request of her neurologist Dr. Tomi Likens.  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  Patient's son was rather concerned feel like his mother was displaying increasing symptoms of depression with frustration over long hospital stay and inability to go home at this point.  As I approached the patient's room her son came out of the patient's room to speak with me before I  entered.  Her son verbalized his concerns around his mother's mood status stating he felt that she was becoming increasingly depressed.  Son acknowledged ongoing mild cognitive difficulties since 2021.  Patient was laying with head slightly elevated in her bed.  Patient was oriented but only supplied limited information about her acute status in the hospital.  Patient stayed rather focus on her frustration with being in the hospital for extended stay and her desire to be discharged soon as possible.  Patient acknowledged that she continues to have difficulty with symptoms such as executive functioning, information processing speed and some memory limitations that have persisted over the past several years.  Patient reports that she is frustrated that she may not be going home during the holidays with an expected discharge date in 7 days.  Patient displayed slowed speech patterns but did remain oriented throughout.  She displayed the ability to learn new information that was very pertinent to her.  Disposition/Plan:  Review of current symptoms and rather stable mild cognitive impairments noted.  The patient acknowledges feeling more depressed  with concerns of loss of function and inability to spend time with her children.  Patient reports that she has been having these types of symptoms even before her most recent fall with orthopedic injuries and significant reduction in physical status.  The patient reports that the symptoms have been there but it worsened acutely with her hospital stay.  I do think that it would be pertinent to address the possible inclusion of an SSRI medication as we could start this medicine and have at least 7 days with explanation to patient and family regarding the length of time for expected responses to an SSRI.  Diagnosis:    Mild major depressive disorder         Electronically Signed   _______________________ Ilean Skill, Psy.D. Clinical Neuropsychologist

## 2022-05-26 NOTE — Progress Notes (Signed)
Physical Therapy Session Note  Patient Details  Name: Miranda Roman MRN: [id number] Date of Birth: 05-Apr-1946  Today's Date: 05/26/2022 PT Individual Time: 1320-1400 PT Individual Time Calculation (min): 40 min   Short Term Goals: Week 1:  PT Short Term Goal 1 (Week 1): Pt will perform STS +1 assist PT Short Term Goal 2 (Week 1): Pt will tolerate sitting OOB >1 hour between sessions PT Short Term Goal 3 (Week 1): Pt will perform least restrictive transfer with max A  Skilled Therapeutic Interventions/Progress Updates:    pt received in bed and agreeable to therapy. Pt finishing lunch and request therapist return in 20 min. Pt missed 20 min of scheduled therapy for meal. Pt required max A and increased time for bed mobility. Tot A to scoot to EOB. Pt then performed slideboard transfer with max A of 1 to w/c. Once in chair, pt could use LUE to reposition. Pt transported to therapy gym for time management and energy conservation. Pt utilized kinetron with LLE only 4 x 1 min for posterior chair strength and endurance. Pt then performed LAQ with RLE with AA and with LLE with 1.5# ankle weight, 3 x 20. Returned to room and pt remained in w/c, was left with all needs in reach and alarm active.   Therapy Documentation Precautions:  Precautions Precautions: Fall Precaution Comments: R knee pain - hold leg up Required Braces or Orthoses: Splint/Cast Splint/Cast: RUE and RLE Restrictions Weight Bearing Restrictions: Yes RUE Weight Bearing: Weight bear through elbow only RLE Weight Bearing: Non weight bearing Other Position/Activity Restrictions: Can bear weight through right elbow, not wrist General:       Therapy/Group: Individual Therapy  Mickel Fuchs 05/26/2022, 3:54 PM

## 2022-05-26 NOTE — Progress Notes (Signed)
Notified of low CBG today at lunch of 47. Corrected to 117. No symptoms.

## 2022-05-26 NOTE — Progress Notes (Signed)
Physical Therapy Weekly Progress Note  Patient Details  Name: Miranda Roman MRN: [id number] Date of Birth: 10/25/1945  Beginning of progress report period: May 20, 2022 End of progress report period: May 27, 2022  {CHL IP REHAB PT TIME CALCULATION:[id number]}  Patient has met 3 of 3 short term goals.  Pt is progressing well toward LTGs. She is able to participate in therapy with improved pain management and is sitting OOB between sessions. Pt is able to perform bed mobility and slideboard transfer with max A. Pt can stand with mod-max x 2 from an elevated surface to [place]. Pt's family has been present during sessions, but has not yet undergone hands on training.   Patient continues to demonstrate the following deficits muscle weakness, decreased cardiorespiratoy endurance, impaired timing and sequencing and decreased coordination, decreased initiation and delayed processing, and decreased standing balance, decreased balance strategies, and difficulty maintaining precautions and therefore will continue to benefit from skilled PT intervention to increase functional independence with mobility.  Patient progressing toward long term goals..  Continue plan of care.  PT Short Term Goals Week 1:  PT Short Term Goal 1 (Week 1): Pt will perform STS +1 assist PT Short Term Goal 1 - Progress (Week 1): Met PT Short Term Goal 2 (Week 1): Pt will tolerate sitting OOB >1 hour between sessions PT Short Term Goal 2 - Progress (Week 1): Met PT Short Term Goal 3 (Week 1): Pt will perform least restrictive transfer with max A PT Short Term Goal 3 - Progress (Week 1): Met Week 2:  PT Short Term Goal 1 (Week 2): =LTGs d/t ELOS  Skilled Therapeutic Interventions/Progress Updates:  Ambulation/gait training;Cognitive remediation/compensation;Discharge planning;DME/adaptive equipment instruction;Functional mobility training;Pain management;Psychosocial support;Splinting/orthotics;Therapeutic  Activities;UE/LE Strength taining/ROM;Visual/perceptual remediation/compensation;Wheelchair propulsion/positioning;UE/LE Coordination activities;Therapeutic Exercise;Stair training;Skin care/wound management;Patient/family education;Neuromuscular re-education;Functional electrical stimulation;Disease management/prevention;Community reintegration;Balance/vestibular training   Therapy Documentation Precautions:  Precautions Precautions: Fall Precaution Comments: R knee pain - hold leg up Required Braces or Orthoses: Splint/Cast Splint/Cast: RUE and RLE Restrictions Weight Bearing Restrictions: Yes RUE Weight Bearing: Weight bear through elbow only RLE Weight Bearing: Non weight bearing Other Position/Activity Restrictions: Can bear weight through right elbow, not wrist General:   Therapy/Group: Individual Therapy  Mickel Fuchs 05/26/2022, 4:04 PM

## 2022-05-26 NOTE — Progress Notes (Signed)
Hypoglycemic Event  CBG: 47  Treatment: 8 oz juice/soda  Symptoms: None  Follow-up CBG: Time:1313  CBG Result:117  Possible Reasons for Event: Inadequate meal intake  Comments/MD notified:PA notified    Trystan Eads [email or web address]

## 2022-05-26 NOTE — Progress Notes (Addendum)
PROGRESS NOTE   Subjective/Complaints:  Pt reports feeling OK'LBM yesterday Using purewick at night for urinary incontinence.  ROS:  Pt denies SOB, abd pain, CP, N/V/C/D, and vision changes    Objective:   No results found. Recent Labs    05/26/22 0705  WBC 8.1  HGB 8.8*  HCT 27.4*  PLT 470*   Recent Labs    05/26/22 0705  NA 139  K 4.1  CL 109  CO2 21*  GLUCOSE 87  BUN 37*  CREATININE 2.60*  CALCIUM 8.2*    Intake/Output Summary (Last 24 hours) at 05/26/2022 1041 Last data filed at 05/26/2022 0934 Gross per 24 hour  Intake 700 ml  Output 1275 ml  Net -575 ml        Physical Exam: Vital Signs Blood pressure (!) 151/71, pulse 87, temperature 98.5 F (36.9 C), temperature source Oral, resp. rate 16, height _0  (1.626 m), weight 78.9 kg, SpO2 97 %.      General: awake, alert, appropriate, sitting up just got tray, starting to eat; NAD HENT: conjugate gaze; oropharynx moist CV: regular rate; no JVD Pulmonary: CTA B/L; no W/R/R- good air movement GI: soft, NT, ND, (+)BS Psychiatric: appropriate- quiet Neurological: delayed responses; dearth of speech- stable Musculoskeletal:     Cervical back: Normal range of motion.     Comments: Right lower leg surgical dressing in place; right forearm splint in place with ACE wrap. RUE tender to minimal palpation and movement.  Can wiggle toes on RLE- no change Skin:    General: Skin is warm and dry Neurological:     Mental Status: She is alert and oriented to person, place, and time.     Comments: Underlying cognitive impairment. Oriented to person, place, reason she's here. Fair insight and awareness. Normal speech, language. CN exam unremarkable. LUE 4/5. RUE limited by pain/ortho. Does move all muscle groups. LLE 2/5 prox to 4/5 distal. RLE tr-1/5 prox to 3/5 distal (can wiggle toes within splint). No focal sensory loss.     Assessment/Plan: 1.  Functional deficits which require 3+ hours per day of interdisciplinary therapy in a comprehensive inpatient rehab setting. Physiatrist is providing close team supervision and 24 hour management of active medical problems listed below. Physiatrist and rehab team continue to assess barriers to discharge/monitor patient progress toward functional and medical goals  Care Tool:  Bathing    Body parts bathed by patient: Face, Front perineal area, Abdomen, Chest, Right arm   Body parts bathed by helper: Buttocks, Left arm Body parts n/a: Right lower leg   Bathing assist Assist Level: Maximal Assistance - Patient 24 - 49%     Upper Body Dressing/Undressing Upper body dressing   What is the patient wearing?: Pull over shirt    Upper body assist Assist Level: Moderate Assistance - Patient 50 - 74%    Lower Body Dressing/Undressing Lower body dressing      What is the patient wearing?: Incontinence brief, Pants     Lower body assist Assist for lower body dressing: Dependent - Patient 0%     Toileting Toileting    Toileting assist Assist for toileting: 2 Helpers  Transfers Chair/bed transfer  Transfers assist  Chair/bed transfer activity did not occur: Safety/medical concerns  Chair/bed transfer assist level: 2 Helpers (slide board)     Locomotion Ambulation   Ambulation assist   Ambulation activity did not occur: Safety/medical concerns          Walk 10 feet activity   Assist  Walk 10 feet activity did not occur: Safety/medical concerns        Walk 50 feet activity   Assist Walk 50 feet with 2 turns activity did not occur: Safety/medical concerns         Walk 150 feet activity   Assist Walk 150 feet activity did not occur: Safety/medical concerns         Walk 10 feet on uneven surface  activity   Assist Walk 10 feet on uneven surfaces activity did not occur: Safety/medical concerns         Wheelchair     Assist Is the  patient using a wheelchair?: Yes Type of Wheelchair: Manual Wheelchair activity did not occur: Safety/medical concerns  Wheelchair assist level: Moderate Assistance - Patient 50 - 74% Max wheelchair distance: 49f    Wheelchair 50 feet with 2 turns activity    Assist        Assist Level: Dependent - Patient 0%   Wheelchair 150 feet activity     Assist      Assist Level: Dependent - Patient 0%    Medical Problem List and Plan: 1. Functional deficits secondary to multiple trauma after ground level fall             -patient may not yet shower             -ELOS/Goals: 12-14 days, min assist to supervision goals  Con't CIR- PT and OT and SLP  Set d/c for 12-14 days 2.  Antithrombotics: -DVT/anticoagulation:  Pharmaceutical: Lovenox 30 mg daily due to renal impairment -venous dopplers appear normal (where able to test given RLE splint)             -antiplatelet therapy: none 3. Pain Management: Tylenol, tramadol, Robaxin prn  11/14- will add Norco 5/325 mg q6 hours prn for pain and monitor cognition- cannot increase tramadol due to renal impairment- will also add Lidoderm patches 3 patches 12 hrs on; 12 hrs off- 8pm-8am  11/15- slept better due to better pain control- con't regimen per nursing, didn't get loopy with meds  11/16- pain well controlled per pt  11/20- due to renal issues, stopped Ultracet TID- can only receive 2 doses/day 4. Mood/Behavior/Sleep: LCSW to evaluate and provide emotional support             -antipsychotic agents: n/a 5. Neuropsych/cognition: This patient is not capable of making decisions on her own behalf. Consider SLP eval.  11/16- sent SLP eval 6. Skin/Wound Care: Routine skin care checks             -monitor surgical incision 7. Fluids/Electrolytes/Nutrition: Routine Is and Os and follow-up chemistries             -ensure adequate PO hydration 8: Right tib-fib fracture s/p ORIF 11/2 -NWB RLE  -follow-up with Dr. LMardelle Matte9: Right  non-displaced wrist fracture: WBAT tolerated through elbow             -maintain wrist splint             -follow-up with Dr. 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  11/16- will stop IVFs- back to baseline 13: Hyperlipidemia: continue Lipitor 14: DM2: CBGs 4 times daily             -continue Levemir 10 units daily             -continue sliding scale             -consider restarting home insulin 4 units with meals  11/14- will monitor trend- is 91-223 in last 24 hours, but give 1 more day before adding insulin  11/15- Had only 1 spike in last 24 hours- will monitor  11/17- CBGs looking great- con't regimen  11/18 one elevated CBG of 208, overall well controlled, continue to monitor  11/20- CBG's are great in AM, but spike after meals- will try Tradjenta since not renally dosed- 5 mg daily- will monitor for hypoglycemia. If doesn't work, will restart insulin with meals 15: Constipation: continue senna and Colace daily; prns 16: Anemia; multi-factorial: follow-up CBC 17: Leukocytosis/low grade fever: abx for pneumonia continue  11/14- WBC down to 15k from 16.4- on Rocephin  11/15- recheck in AM  11/16- WBC 10.3-  11/20- WBC down to 8.1 18: AKI: in setting of CKD4 serum creatinine improving, still elevated (baseline 2.3-2.6)             -follow-up on BMP  11/14- Cr down to 2.74 from 3.04- so heading in right direction   11/15- recheck in AM  11/16- Cr 2.37- will stop IVFs since at baseline  11/20- Cr up a little to 2.6- will let push fluids and recheck Wednesday 19. Confusion  11/16- will check U/A and Cx since family says confusion worse/not at baseline- esp since flooding bed multiple times per day  11/17- U/A not back- will order again just to make sure  -spoke with charge nurse- they can  cath her if need be to get specimen.   -11/18 Urine culture with no growth 20. Allergies  11/20- will write for pt's Zyrtec - brought from home. and stop Claritin   I spent a total of 39   minutes on total care today- >50% coordination of care- due to call pharmacy about BG's also d/w nursing about Zyrtec.    LOS: 7 days A FACE TO FACE EVALUATION WAS PERFORMED  Miranda Roman 05/26/2022, 10:41 AM

## 2022-05-26 NOTE — Progress Notes (Addendum)
Occupational Therapy Session Note  Patient Details  Name: Miranda Roman MRN: [id number] Date of Birth: 1945/08/05  Today's Date: 05/26/2022 OT Individual Time: 1450-1605 OT Individual Time Calculation (min): 75 min    Short Term Goals: Week 1:  OT Short Term Goal 1 (Week 1): Pt will completed UB dressing and bathing with Max A while seated on EOB. OT Short Term Goal 2 (Week 1): Pt will complete functional transfer from bed to [facility] with Max Ax1 utilizing lateral scoot technique and/or with LRAD. OT Short Term Goal 3 (Week 1): Pt will complete LB bathing task with Max A at bed level.  Skilled Therapeutic Interventions/Progress Updates:    Upon OT arrival, pt seated in w/c with son and HH aid present in room. Pt requesting to have a BM on toilet and is agreeable to OT treatment. Pt completes SB transfer to [facility] with Total A x 2 and toileting with Total A x 3. Pt performs first sit to stand transfer with PFRW and Max A x 2 but with 4 more attempts to perform toileting pt requires Total A x3. Max verbal cues required for hand placement, WB precautions, and posture during transfers and standing balance. Pt was placed in w/c and transported to main therapy gym to perform UE exercises including shoulder flex/ext, elbow flex/ext, scapular prot/retr, supin/pron, and wirst flex/ext for 2x10 reps using 3lb dumbbell. Pt completes SB transfer back into bed with Total A x 1 and completes sit to supine transfer with Mod A. Pt was left in bed at end of session with all needs met and safety measures in place. Son at bedside.   Therapy Documentation Precautions:  Precautions Precautions: Fall Precaution Comments: R knee pain - hold leg up Required Braces or Orthoses: Splint/Cast Splint/Cast: RUE and RLE Restrictions Weight Bearing Restrictions: Yes RUE Weight Bearing: Weight bear through elbow only RLE Weight Bearing: Non weight bearing Other Position/Activity Restrictions: Can bear weight through right  elbow, not wrist   Therapy/Group: Individual Therapy  [name] 05/26/2022, 3:47 PM

## 2022-05-26 NOTE — Progress Notes (Signed)
Occupational Therapy Session Note  Patient Details  Name: Miranda Roman MRN: [id number] Date of Birth: 11-Sep-1945  Today's Date: 05/26/2022 OT Individual Time: 9323-5573 OT Individual Time Calculation (min): 70 min    Short Term Goals: Week 1:  OT Short Term Goal 1 (Week 1): Pt will completed UB dressing and bathing with Max A while seated on EOB. OT Short Term Goal 2 (Week 1): Pt will complete functional transfer from bed to [facility] with Max Ax1 utilizing lateral scoot technique and/or with LRAD. OT Short Term Goal 3 (Week 1): Pt will complete LB bathing task with Max A at bed level.  Skilled Therapeutic Interventions/Progress Updates:    OT intervention with focus on bed moblity, sitting balance, LB dressing at bed level and UB dressing seated in w/c, SB tranfsers, and UB bathing seated in w/c to increase independence with BADLs. Rolling in bed with mod A to Lt and max A to Rt to facilitate donning pants. Tot A for donning pants at bed level. Pt initiated assisting with pulling pants over hips. Supine>sit EOB with max A. Sitting balance with supervision. SB transfer with tot A and +2 to steady w/c. UB bathing with min A. UB dressing with mod A. Pt brushed teeth with setup. Pt remained in w/c with all needs within reach. Belt alarm activated.   Therapy Documentation Precautions:  Precautions Precautions: Fall Precaution Comments: R knee pain - hold leg up Required Braces or Orthoses: Splint/Cast Splint/Cast: RUE and RLE Restrictions Weight Bearing Restrictions: Yes RUE Weight Bearing: Weight bear through elbow only RLE Weight Bearing: Non weight bearing Other Position/Activity Restrictions: Can bear weight through right elbow, not wrist  Pain:  Pt denies pain at rest but escalates with transitional movements; repositioned and emotional support   Therapy/Group: Individual Therapy  [name] 05/26/2022, 10:37 AM

## 2022-05-27 ENCOUNTER — Ambulatory Visit (INDEPENDENT_AMBULATORY_CARE_PROVIDER_SITE_OTHER): Payer: Self-pay | Admitting: Plastic Surgery

## 2022-05-27 DIAGNOSIS — T148XXA Other injury of unspecified body region, initial encounter: Secondary | ICD-10-CM

## 2022-05-27 LAB — GLUCOSE, CAPILLARY
Glucose-Capillary: 222 mg/dL — ABNORMAL HIGH (ref 70–99)
Glucose-Capillary: 104 mg/dL — ABNORMAL HIGH (ref 70–99)
Glucose-Capillary: 117 mg/dL — ABNORMAL HIGH (ref 70–99)
Glucose-Capillary: 166 mg/dL — ABNORMAL HIGH (ref 70–99)

## 2022-05-27 MED ORDER — INSULIN ASPART 100 UNIT/ML IJ SOLN
0.0000 [IU] | Freq: Three times a day (TID) | INTRAMUSCULAR | Status: DC
  Administered 2022-05-28: 1 [IU] via SUBCUTANEOUS
  Administered 2022-05-29: 2 [IU] via SUBCUTANEOUS
  Administered 2022-05-30 – 2022-06-01 (×6): 1 [IU] via SUBCUTANEOUS
  Administered 2022-06-02: 3 [IU] via SUBCUTANEOUS
  Administered 2022-06-02: 2 [IU] via SUBCUTANEOUS
  Administered 2022-06-03 – 2022-06-04 (×4): 1 [IU] via SUBCUTANEOUS
  Administered 2022-06-05: 2 [IU] via SUBCUTANEOUS
  Administered 2022-06-05: 1 [IU] via SUBCUTANEOUS

## 2022-05-27 MED ORDER — INSULIN ASPART 100 UNIT/ML IJ SOLN
0.0000 [IU] | Freq: Every day | INTRAMUSCULAR | Status: DC
  Administered 2022-05-30 – 2022-06-02 (×2): 2 [IU] via SUBCUTANEOUS
  Administered 2022-06-04: 1 [IU] via SUBCUTANEOUS

## 2022-05-27 MED ORDER — FLUOXETINE HCL 10 MG PO CAPS
10.0000 mg | ORAL_CAPSULE | Freq: Every day | ORAL | Status: DC
  Administered 2022-05-27 – 2022-06-06 (×11): 10 mg via ORAL
  Filled 2022-05-27 (×11): qty 1

## 2022-05-27 NOTE — Progress Notes (Signed)
Occupational Therapy Session Note  Patient Details  Name: Miranda Roman MRN: [id number] Date of Birth: 1945/10/16  Today's Date: 05/27/2022 OT Individual Time: 9150-4136 OT Individual Time Calculation (min): 60 min  and Today's Date: 05/27/2022 OT Missed Time: 15 Minutes Missed Time Reason: Other (comment) (eating breakfast)   Short Term Goals: Week 2:  OT Short Term Goal 1 (Week 2): Pt will complete LB bathing task with Max A at bed level. OT Short Term Goal 2 (Week 2): Pt will complete LB bathing task with Max A at bed level.  Skilled Therapeutic Interventions/Progress Updates:    Pt resting in bed upon arrival. Pt reports she is "wet." Rolling R/L using bed rails. Mod A to Rt and max A+2 to Lt. Dependent for donning brief. Mod A for pericare. Tot A+2 for donning pants at bed level. Max A for supine>sit EOB. Sitting balance EOB with CGA. SB transfer to w/c with max A+2. UB bathing with mod A seated in w/c. UB dressing with pullover sweatshirt with max A to thread over RUE splint and LUE IV. Pt assisted with pulling over trunk. Pt seated in w/c eating breakfast which was delivered during session. Pt's son present. Belt alarm activated. All needs within reach. Pt missed 15 mins skilled OT services to allow time for pt to eat before next therapy.   Therapy Documentation Precautions:  Precautions Precautions: Fall Precaution Comments: R knee pain - hold leg up Required Braces or Orthoses: Splint/Cast Splint/Cast: RUE and RLE Restrictions Weight Bearing Restrictions: Yes RUE Weight Bearing: Weight bear through elbow only RLE Weight Bearing: Non weight bearing Other Position/Activity Restrictions: Can bear weight through right elbow, not wrist General: General OT Amount of Missed Time: 15 Minutes Pain: Pt denies pain at rest but escalates with transitoinal movements; premedicated and repositioned   Therapy/Group: Individual Therapy  [name] 05/27/2022, 9:22  AM

## 2022-05-27 NOTE — Progress Notes (Signed)
Occupational Therapy Session Note  Patient Details  Name: Miranda Roman MRN: [id number] Date of Birth: March 24, 1946  Today's Date: 05/27/2022 OT Individual Time: 1130-1157 OT Individual Time Calculation (min): 27 min    Short Term Goals: Week 2:  OT Short Term Goal 1 (Week 2): Pt will complete LB bathing task with Max A at bed level. OT Short Term Goal 2 (Week 2): Pt will complete LB bathing task with Max A at bed level.  Skilled Therapeutic Interventions/Progress Updates:    Pt resting in w/c upon arrival. SB transfer to EOB with tot A+2. Sit>supine with max A+2. Repsitioning in bed with mod A. LUE AAROM/AROM/PROM to maintain strength and mobility. 3# barbell for chest presses, curls, and overhead reaches. Pt remained in bed with all needs within reach. Bed alarm activated. Son present.   Therapy Documentation Precautions:  Precautions Precautions: Fall Precaution Comments: R knee pain - hold leg up Required Braces or Orthoses: Splint/Cast Splint/Cast: RUE and RLE Restrictions Weight Bearing Restrictions: Yes RUE Weight Bearing: Weight bear through elbow only RLE Weight Bearing: Non weight bearing Other Position/Activity Restrictions: Can bear weight through right elbow, not wrist Pain: Pt denies pain at rest but escalates with transitional movements; repositioned Therapy/Group: Individual Therapy  [name] 05/27/2022, 12:05 PM

## 2022-05-27 NOTE — Progress Notes (Signed)
Informed that patient's BS dropped to 60. BS have been labile but she's on moderate scale SSI-->will modify to sensitive.

## 2022-05-27 NOTE — Progress Notes (Signed)
Physical Therapy Session Note  Patient Details  Name: Miranda Roman MRN: [id number] Date of Birth: [birth year]  Today's Date: 05/27/2022 PT Individual Time: 1000-1045 and 1332-1430 PT Individual Time Calculation (min): 45 min and 58 min.  Short Term Goals: Week 1:  PT Short Term Goal 1 (Week 1): Pt will perform STS +1 assist PT Short Term Goal 1 - Progress (Week 1): Met PT Short Term Goal 2 (Week 1): Pt will tolerate sitting OOB >1 hour between sessions PT Short Term Goal 2 - Progress (Week 1): Met PT Short Term Goal 3 (Week 1): Pt will perform least restrictive transfer with max A PT Short Term Goal 3 - Progress (Week 1): Met  Skilled Therapeutic Interventions/Progress Updates:   First session:  Pt presents sitting in w/c and agreeable to therapy.  Pt wheeled to small gym for energy conservation.  Pt performed LE there ex to increase strength, LAQ 3 x 10, hip flexion and isometric add w/ AAROM to R LE, 1 1/2# to Left.  Pt states pain to R knee.  Pt performed scooting forward and back in seat using mod A and verbal and visual cues for leaning.  Pt performed seated overhead press B UE s, reaching forward and crossing midline outside of BOS w/ alternating UE s to facilitate forward lean.  Pt requires rest breaks 2/2 fatigue.  Pt returned to room and remained sitting in w/c w/ chair alarm on and all needs in reach.  Son present in room.  Second session:  Pt presents supine in bed and agreeable to therapy.  Pt performs sup to sit transfers w/ max A and verbal and visual cues for hand placement, especially w/ rolling to R.  Pt assisted w/ scooting to EOB w/ bridging LLE.  Pt required max A for completely coming to sit and then mod A and verbal cues for scooting to EOB maintaining NWB R hand/wrist.  Once seated, pt able to sit w/ supervision and verbal cues for C/spine ROM, flex/ext, rotation.  Pt transferred sit to stand from elevated bed height and total A multiple trials.  Pt able to maintain  NWB RLE x 90% of time w/ PT foot underneath.  Pt requires verbal cues for improved posture, although unable to come to upright stance.  Pt stood only 30 seconds- 1 min per trial.  Pt required max A to transfer sitto supine bringing L LE onto bed w/ sequential turns bringing LE s closer to bed.  Pt able to unilateral bridge to center of bed.  Pt performed 3 x 5 LLE bridging, then B HS and abd/add, w/ AAROM to RLE.  Pt remained supine in bed w/ aide to give bath at conclusion of rx.  Care handed off to personal HHA.     Therapy Documentation Precautions:  Precautions Precautions: Fall Precaution Comments: R knee pain - hold leg up Required Braces or Orthoses: Splint/Cast Splint/Cast: RUE and RLE Restrictions Weight Bearing Restrictions: Yes RUE Weight Bearing: Weight bear through elbow only RLE Weight Bearing: Non weight bearing Other Position/Activity Restrictions: Can bear weight through right elbow, not wrist General:   Vital Signs:  Pain: 5/10  Pain Assessment Pain Scale: 0-10 Pain Score: 3  Pain Location: Leg Pain Orientation: Right Pain Descriptors / Indicators: Aching Pain Intervention(s): Medication (See eMAR) Mobility:    Other Treatments:      Therapy/Group: Individual Therapy  Ladoris Gene 05/27/2022, 12:02 PM

## 2022-05-27 NOTE — Progress Notes (Signed)
Occupational Therapy Weekly Progress Note  Patient Details  Name: Miranda Roman MRN: [id number] Date of Birth: 24-Apr-1946  Beginning of progress report period: May 20, 2022 End of progress report period: May 27, 2022  Patient has met 1 of 3 short term goals.  Pt is making steady progress with BADLs and functional transfers since admission. Pt requires mod/max A for rolling in bed to facilitate LB dressing at bed level. Supine>sit EOB with max A+2. Sitting balance EOB with CGA/supervision. Sit<>stand from EOB with max A+2. UB bathing/dressing seated in w/c with mod A. SB transfers with max A+2. Pt requires mod verbal cues to adhere to [facility] precautions. Family has been at bedside during therapy.  Patient continues to demonstrate the following deficits: muscle weakness and muscle joint tightness and decreased sitting balance, decreased standing balance, decreased postural control, and decreased balance strategies and therefore will continue to benefit from skilled OT intervention to enhance overall performance with BADL, iADL, and Reduce care partner burden.  Patient progressing toward long term goals..  Continue plan of care.  OT Short Term Goals Week 1:  OT Short Term Goal 1 (Week 1): Pt will completed UB dressing and bathing with Max A while seated on EOB. OT Short Term Goal 1 - Progress (Week 1): Met OT Short Term Goal 2 (Week 1): Pt will complete functional transfer from bed to [facility] with Max Ax1 utilizing lateral scoot technique and/or with LRAD. OT Short Term Goal 2 - Progress (Week 1): Progressing toward goal OT Short Term Goal 3 (Week 1): Pt will complete LB bathing task with Max A at bed level. OT Short Term Goal 3 - Progress (Week 1): Progressing toward goal OT Short Term Goal 4 (Week 1): Pt will complete LB dressing tasks with max A sit<>stand from EOB Week 2:  OT Short Term Goal 1 (Week 2): Pt will complete LB bathing task with Max A at bed level. OT Short Term Goal 2  (Week 2): Pt will complete LB bathing task with Max A at bed level.     [name] [facility] 05/27/2022, 6:36 AM

## 2022-05-27 NOTE — Progress Notes (Signed)
PROGRESS NOTE   Subjective/Complaints:  Pt reports she's depressed since cannot be home for Thanksgiving.  Is cold- room is cold.   Asked nursing to have maintenance look at fixing room temp- is already set at max and feels <70 degrees in room.   ROS:  Pt denies SOB, abd pain, CP, N/V/C/D, and vision changes    Objective:   No results found. Recent Labs    05/26/22 0705  WBC 8.1  HGB 8.8*  HCT 27.4*  PLT 470*   Recent Labs    05/26/22 0705  NA 139  K 4.1  CL 109  CO2 21*  GLUCOSE 87  BUN 37*  CREATININE 2.60*  CALCIUM 8.2*    Intake/Output Summary (Last 24 hours) at 05/27/2022 0841 Last data filed at 05/27/2022 0706 Gross per 24 hour  Intake 355 ml  Output 700 ml  Net -345 ml        Physical Exam: Vital Signs Blood pressure (!) 165/74, pulse 79, temperature 98.6 F (37 C), temperature source Oral, resp. rate 18, height _0  (1.626 m), weight 78.9 kg, SpO2 99 %.       General: awake, alert, appropriate, supine in bed; c/o being cold; room cold; NAD HENT: conjugate gaze; oropharynx moist CV: regular rate; no JVD Pulmonary: CTA B/L; no W/R/R- good air movement GI: soft, NT, ND, (+)BS Psychiatric: appropriate but flat; admits to depression Neurological: delayed responses- stable Musculoskeletal:     Cervical back: Normal range of motion.     Comments: Right lower leg surgical dressing in place; right forearm splint in place with ACE wrap. RUE tender to minimal palpation and movement.  Can wiggle toes on RLE- no change Skin:    General: Skin is warm and dry Neurological:     Mental Status: She is alert and oriented to person, place, and time.     Comments: Underlying cognitive impairment. Oriented to person, place, reason she's here. Fair insight and awareness. Normal speech, language. CN exam unremarkable. LUE 4/5. RUE limited by pain/ortho. Does move all muscle groups. LLE 2/5 prox to 4/5  distal. RLE tr-1/5 prox to 3/5 distal (can wiggle toes within splint). No focal sensory loss.     Assessment/Plan: 1. Functional deficits which require 3+ hours per day of interdisciplinary therapy in a comprehensive inpatient rehab setting. Physiatrist is providing close team supervision and 24 hour management of active medical problems listed below. Physiatrist and rehab team continue to assess barriers to discharge/monitor patient progress toward functional and medical goals  Care Tool:  Bathing    Body parts bathed by patient: Face, Front perineal area, Abdomen, Chest, Right arm   Body parts bathed by helper: Buttocks, Left arm Body parts n/a: Right lower leg   Bathing assist Assist Level: Maximal Assistance - Patient 24 - 49%     Upper Body Dressing/Undressing Upper body dressing   What is the patient wearing?: Pull over shirt    Upper body assist Assist Level: Moderate Assistance - Patient 50 - 74%    Lower Body Dressing/Undressing Lower body dressing      What is the patient wearing?: Incontinence brief, Pants     Lower body  assist Assist for lower body dressing: Dependent - Patient 0%     Toileting Toileting    Toileting assist Assist for toileting: 2 Helpers     Transfers Chair/bed transfer  Transfers assist  Chair/bed transfer activity did not occur: Safety/medical concerns  Chair/bed transfer assist level: 2 Helpers (slide board)     Locomotion Ambulation   Ambulation assist   Ambulation activity did not occur: Safety/medical concerns          Walk 10 feet activity   Assist  Walk 10 feet activity did not occur: Safety/medical concerns        Walk 50 feet activity   Assist Walk 50 feet with 2 turns activity did not occur: Safety/medical concerns         Walk 150 feet activity   Assist Walk 150 feet activity did not occur: Safety/medical concerns         Walk 10 feet on uneven surface  activity   Assist Walk 10  feet on uneven surfaces activity did not occur: Safety/medical concerns         Wheelchair     Assist Is the patient using a wheelchair?: Yes Type of Wheelchair: Manual Wheelchair activity did not occur: Safety/medical concerns  Wheelchair assist level: Moderate Assistance - Patient 50 - 74% Max wheelchair distance: 69f    Wheelchair 50 feet with 2 turns activity    Assist        Assist Level: Dependent - Patient 0%   Wheelchair 150 feet activity     Assist      Assist Level: Dependent - Patient 0%    Medical Problem List and Plan: 1. Functional deficits secondary to multiple trauma after ground level fall             -patient may not yet shower             -ELOS/Goals: 12-14 days, min assist to supervision goals  Con't CIR- PT, OT   Team conference today to f/u on progress 2.  Antithrombotics: -DVT/anticoagulation:  Pharmaceutical: Lovenox 30 mg daily due to renal impairment -venous dopplers appear normal (where able to test given RLE splint)             -antiplatelet therapy: none 3. Pain Management: Tylenol, tramadol, Robaxin prn  11/14- will add Norco 5/325 mg q6 hours prn for pain and monitor cognition- cannot increase tramadol due to renal impairment- will also add Lidoderm patches 3 patches 12 hrs on; 12 hrs off- 8pm-8am  11/15- slept better due to better pain control- con't regimen per nursing, didn't get loopy with meds  11/16- pain well controlled per pt  11/20- due to renal issues, stopped Ultracet TID- can only receive 2 doses/day 4. Mood/Behavior/Sleep: LCSW to evaluate and provide emotional support  11/21- will add Prozac 10 mg daily             -antipsychotic agents: n/a 5. Neuropsych/cognition: This patient is not capable of making decisions on her own behalf. Consider SLP eval.  11/16- sent SLP eval 6. Skin/Wound Care: Routine skin care checks             -monitor surgical incision 7. Fluids/Electrolytes/Nutrition: Routine Is and Os  and follow-up chemistries             -ensure adequate PO hydration 8: Right tib-fib fracture s/p ORIF 11/2 -NWB RLE  -follow-up with Dr. LMardelle Matte9: Right non-displaced wrist fracture: WBAT tolerated through elbow             -  maintain wrist splint             -follow-up with Dr. 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  11/16- will stop IVFs- back to baseline 13: Hyperlipidemia: continue Lipitor 14: DM2: CBGs 4 times daily             -continue Levemir 10 units daily             -continue sliding scale             -consider restarting home insulin 4 units with meals  11/14- will monitor trend- is 91-223 in last 24 hours, but give 1 more day before adding insulin  11/15- Had only 1 spike in last 24 hours- will monitor  11/17- CBGs looking great- con't regimen  11/18 one elevated CBG of 208, overall well controlled, continue to monitor  11/20- CBG's are great in AM, but spike after meals- will try Tradjenta since not renally dosed- 5 mg daily- will monitor for hypoglycemia. If doesn't work, will restart insulin with meals  11/21- CBG dropped to 81 yesterday- will stop Tradjenta and wait to see how CBGs do before making more changes 15: Constipation: continue senna and Colace daily; prns 16: Anemia; multi-factorial: follow-up CBC 17: Leukocytosis/low grade fever: abx for pneumonia continue  11/14- WBC down to 15k from 16.4- on Rocephin  11/15- recheck in AM  11/16- WBC 10.3-  11/20- WBC down to 8.1 18: AKI: in setting of CKD4 serum creatinine improving, still elevated (baseline 2.3-2.6)             -follow-up on BMP  11/14- Cr down to 2.74 from 3.04- so heading in right direction   11/15- recheck in AM  11/16- Cr 2.37- will stop IVFs since at baseline  11/20- Cr up a little to 2.6- will let  push fluids and recheck Wednesday  11/21- labs in AM 19. Confusion  11/16- will check U/A and Cx since family says confusion worse/not at baseline- esp since flooding bed multiple times per day  11/17- U/A not back- will order again just to make sure  -spoke with charge nurse- they can cath her if need be to get specimen.   -11/18 Urine culture with no growth 20. Allergies  11/20- will write for pt's Zyrtec - brought from home. and stop Claritin 21. Depression  11/21- will start Prozac 10 mg daily-    I spent a total of  36  minutes on total care today- >50% coordination of care- due to d/w neuropsych about mood as well as team conference to f/u on progress        LOS: 8 days A FACE TO FACE EVALUATION WAS PERFORMED  Kimberlly Norgard 05/27/2022, 8:41 AM

## 2022-05-27 NOTE — Progress Notes (Signed)
I spoke with the patient's daughter and the patient has fallen again and is in rehab.  The daughter will let us know when she is out of rehab.

## 2022-05-27 NOTE — Progress Notes (Deleted)
   Subjective:    Patient ID: Miranda Roman, female    DOB: March 27, 1946, 76 y.o.   MRN: [id number]  HPI    Review of Systems     Objective:   Physical Exam        Assessment & Plan:

## 2022-05-27 NOTE — Progress Notes (Signed)
Patient ID: Miranda Roman, female   DOB: 06/11/1946, 76 y.o.   MRN: [id number]  1338-

## 2022-05-27 NOTE — Patient Care Conference (Signed)
Inpatient RehabilitationTeam Conference and Plan of Care Update Date: 05/27/2022   Time: 11:01 AM    Patient Name: Miranda Roman      Medical Record Number: [id number]  Date of Birth: 1945-11-22 Sex: Female         Room/Bed: 4M02C/4M02C-01 Payor Info: Payor: [place] / Plan: [facility] MEDICARE / Product Type: *No Product type* /    Admit Date/Time:  05/19/2022  2:52 PM  Primary Diagnosis:  Critical polytrauma  Hospital Problems: Principal Problem:   Critical polytrauma Active Problems:   Right tibial fracture   Mild cognitive disorder    Expected Discharge Date: Expected Discharge Date: 06/06/22  Team Members Present: Physician leading conference: Dr. Courtney Heys Social Worker Present: [name], [place] Nurse Present: Tacy Learn, RN PT Present: Tereasa Coop, PT OT Present: Roanna Epley, Joni Fears, OT PPS Coordinator present : Gunnar Fusi, SLP     Current Status/Progress Goal Weekly Team Focus  Bowel/Bladder    Incontinent B/B  To be continent B/b  Toilet every 2 hours and PRN   Swallow/Nutrition/ Hydration               ADL's   bathing-mod A at bed level and seated in w/c; UB dressing-mod A; LB dressing-tot A; toileting-bed pan; SB tranfsers with max A+2   bathing-min A; LB dressing-mod A; toilet transfers-min A; toileting-mod A   functional tranfsers, BADLs, activity tolerance, education    Mobility   mod-max x 2 bed mobility, mod x 2/max x 1 SBT, mod-max x 2 from elevated surface sts, unlikely will be functional with stand pivot   mod A transfers, min A w/c  transfers, pain management    Communication                Safety/Cognition/ Behavioral Observations               Pain    Pain managed with PRN medications  Pain less than 3 out of 10 on pain scale with PRN medications  Assess every 4 hours and prior to therapy.    Skin    Skin is CDI   Skin will remain free of infection/breakdown  Assess every  shift and PRN      Discharge Planning:  Pt lives with her dtr and she receives an aide M-F 10am-12pm. Plans to hire 24/7 care if needed at discharge.  SW will confirm there are no barriers to discharge.   Team Discussion: Critical polytrauma. Incontinent B/B. I/O cath. Timed toileting with bed pan. PVRs needed. Stopped Ultracet. Tradjenta stopped due to low blood sugar. Prozac started. RLE-NWB, Right wrist-weight bearing though elbow only, NWB to wrist. Therapies Mod/Max assist limited by pain.  Patient on target to meet rehab goals: Working toward [place] and progress notes for long and short-term goals.   Revisions to Treatment Plan:  Medication adjustments, time toileting, PVRs, monitor labs  Teaching Needs: Medications, safety, transfer training, etc.   Current Barriers to Discharge: Decreased caregiver support, Home enviroment access/layout, Incontinence, and Weight bearing restrictions  Possible Resolutions to Barriers: Family education, nursing education, order recommended DME     Medical Summary Current Status: incontinent B/B- LBM 11/19- - stil having pain- RLE NWB- cathing pt- due to elevated PVRs- last cathed 11/17- need more PVRs=- dc'd Ultracet and con't Tramadol prn and Norco-  Barriers to Discharge: Behavior/Mood;Incontinence;Inadequate Nutritional Intake;Medical stability;Neurogenic Bowel & Bladder;Self-care education;Uncontrolled Diabetes;Weight bearing restrictions  Barriers to Discharge Comments: to get private aide  care- always incontinent- needs timed voiding Possible Resolutions to Barriers/Weekly Focus: timed voiding with bedpan;  goals min -mod A - added prozac for depression- tolerating pain more- SB transfers max A right now- still limited by pain- worsening cog due to situation- d/c 06/06/22   Continued Need for Acute Rehabilitation Level of Care: The patient requires daily medical management by a physician with specialized training in physical  medicine and rehabilitation for the following reasons: Direction of a multidisciplinary physical rehabilitation program to maximize functional independence : Yes Medical management of patient stability for increased activity during participation in an intensive rehabilitation regime.: Yes Analysis of laboratory values and/or radiology reports with any subsequent need for medication adjustment and/or medical intervention. : Yes   I attest that I was present, lead the team conference, and concur with the assessment and plan of the team.   Ernest Pine 05/27/2022, 5:12 PM

## 2022-05-28 LAB — BASIC METABOLIC PANEL
Anion gap: 8 (ref 5–15)
BUN: 39 mg/dL — ABNORMAL HIGH (ref 8–23)
CO2: 23 mmol/L (ref 22–32)
Calcium: 8.3 mg/dL — ABNORMAL LOW (ref 8.9–10.3)
Chloride: 110 mmol/L (ref 98–111)
Creatinine, Ser: 2.84 mg/dL — ABNORMAL HIGH (ref 0.44–1.00)
GFR, Estimated: 17 mL/min — ABNORMAL LOW (ref >60)
Glucose, Bld: 92 mg/dL (ref 70–99)
Potassium: 4.5 mmol/L (ref 3.5–5.1)
Sodium: 141 mmol/L (ref 135–145)

## 2022-05-28 LAB — GLUCOSE, CAPILLARY
Glucose-Capillary: 121 mg/dL — ABNORMAL HIGH (ref 70–99)
Glucose-Capillary: 60 mg/dL — ABNORMAL LOW (ref 70–99)
Glucose-Capillary: 106 mg/dL — ABNORMAL HIGH (ref 70–99)
Glucose-Capillary: 96 mg/dL (ref 70–99)

## 2022-05-28 NOTE — Progress Notes (Signed)
Explain to patient and daughter regarding bladder program and purpose,,they both states that this will have to wait until tomorrow so that she can sleep. Request purewick to be place for tonight, so that she can rest because she is a heavy wetter,.NT was informed by writer to applied purewick.[name]

## 2022-05-28 NOTE — Progress Notes (Signed)
Occupational Therapy Session Note  Patient Details  Name: Miranda Roman MRN: [id number] Date of Birth: 1946/03/28  Today's Date: 05/28/2022 OT Individual Time: 8159-4707 OT Individual Time Calculation (min): 70 min    Short Term Goals: Week 2:  OT Short Term Goal 1 (Week 2): Pt will complete LB bathing task with Max A at bed level. OT Short Term Goal 2 (Week 2): Pt will complete LB bathing task with Max A at bed level.  Skilled Therapeutic Interventions/Progress Updates:    Pt eating breakfast while resting in bed upon arrival. Daughter entered room shortly after session began. OT intervention with focus on bed mobility, LB dressing at bed level, SB tranfser, and UB bathing/dressing seated in w/c to increase independence with BADLs. Pt roll to Rt with min A and max A to Lt to facilitate pulling pants over hips. Pt required tot A to don pants at bed level. Supine>sit EOB with max A. Max A for reciprocal scoots to EOB. SB transfer to w/c with tot A+2. Pt reports less pain with transitional movements this morning. UB bathing seated in w/c with supervision. UB dressing seated in w/c with min A to pull over trunk. BUE/shoulder AROM to maintain joint mobility. Pt remained seated in w/c with belt alarm activated. All needs within reach. Daughter present.   Therapy Documentation Precautions:  Precautions Precautions: Fall Precaution Comments: R knee pain - hold leg up Required Braces or Orthoses: Splint/Cast Splint/Cast: RUE and RLE Restrictions Weight Bearing Restrictions: Yes RUE Weight Bearing: Weight bear through elbow only RLE Weight Bearing: Non weight bearing Other Position/Activity Restrictions: Can bear weight through right elbow, not wrist  Pain: Pt denies pain except with movement of RLE; repositioning provides relief  [name] 05/28/2022, 9:30 AM

## 2022-05-28 NOTE — Progress Notes (Signed)
Greeted patient in bed finishing breakfast, family at bedside. Reviewed binder to ensure all information in place. Verified that was taking insulin at home prior to admission. A1c 5.5. Family said that was 5.2 but still really good. No questions or concerns at this time.

## 2022-05-28 NOTE — Progress Notes (Signed)
Physical Therapy Session Note  Patient Details  Name: Miranda Roman MRN: [id number] Date of Birth: Nov 02, 1945  Today's Date: 05/28/2022 PT Individual Time: 1110-1205 and 8889-1694 PT Individual Time Calculation (min): 55 min and 55 min  Short Term Goals: Week 2:  PT Short Term Goal 1 (Week 2): =LTGs d/t ELOS  Skilled Therapeutic Interventions/Progress Updates: Pt presented in recliner agreeable to therapy. Pt states no pain at rest. Per dgt pt needed to use BSC so performed stand with x 3 people and PFRW with NT changing appliances behind pt (w/c to [facility] to recliner). Pt transported to rehab gym in recliner for energy conservation purposes. Pt participated in seated LE therex for strengthening incl: LAQ AA on RLE x 10, LLE with 3lb cuff. Hip flexion x 15 AROM RLE, 3lb weighted cuff LLE x 15, hip abd 3lb LLE x 15. Pt also performed modified sit up from recliner x 10 for core activation. Pt required maxA for scooting anteriorly to EOC then performed ball taps x 2 min with LUE only for sitting balance. Pt then worked on [employer] to stand from Psychologist, occupational x 2 with minA for increased scooting to [place] and modA x 2 to perform full stand. PTA's foot placed under pt's RLE to avoid weight bearing. Pt was able to tolerate both stands for ~45 sec each with tactile cues for increased anterior translation of hips and improved erect posture. Pt then transported back to room and performed stand in same manner as prior to allow NT to remove recliner and bring bed to pt's hips to complete transfer to bed. Pt then performed sit to supine with modA, increased time and use of bed rail. PTA managed RLE and providing verbal cues for sequencing to complete transfer. Pt was able to scoot to [facility] via single leg bridge and minA. Pt repositioned to comfort and left with bed alarm on, call bell within reach and needs met.   Tx2: Pt presented in recliner agreeable to therapy. Pt denies pain at rest, c/o R knee pain with movement and noted  guarding when performing bed mobility. Pt transported to ortho gym for time management. Pt required max progressing to modA to scoot anteriorly in recliner. Pt then participated in [place] sequencing activity to facilitate anterior lean and incorporate reaching tasks. Pt was able to perform reaching with RUE and maintain precautions. Pt then transported to day room and participated in Cybex Kinsetron 60cm/sec x 2 min for endurance. When PTA starting next cycle pt indicating urinary incontinence and pt able to describe that she was soiled through pants. Pt returned to room and was able to perform Sit to stand with modA x 2 demonstrating improved technique than when performed in AM and pt was able to complete full erect posture! Pt returned to sitting at EOB and was able to transfer to supine with modA for sequencing and RLE management. Pt was able to perform SL bridge to allow PTA to pull pants over hips. Pt required modA to roll to L and minA to roll to R as PTA completed peri-care and changed brief. Pt left in bed at end of session with bed alarm on, call bell within reach and needs met.      Therapy Documentation Precautions:  Precautions Precautions: Fall Precaution Comments: R knee pain - hold leg up Required Braces or Orthoses: Splint/Cast Splint/Cast: RUE and RLE Restrictions Weight Bearing Restrictions: Yes RUE Weight Bearing: Weight bear through elbow only RLE Weight Bearing: Non weight bearing Other Position/Activity Restrictions:  Can bear weight through right elbow, not wrist General:   Vital Signs:  Pain: Pain Assessment Pain Scale: 0-10 Pain Score: 4  Pain Location: Leg Pain Orientation: Right Pain Descriptors / Indicators: Constant;Aching Pain Frequency: Constant Pain Onset: On-going Pain Intervention(s): Medication (See eMAR) Mobility:   Locomotion :    Trunk/Postural Assessment :    Balance:   Exercises:   Other Treatments:      Therapy/Group: Individual  Therapy    05/28/2022, 12:16 PM

## 2022-05-28 NOTE — Progress Notes (Signed)
Patient ID: Miranda Roman, female   DOB: [birth year], 76 y.o.   MRN: [id number]  SW ordered DME: bilateral platform attachments, and elevating leg rests with [employer] via parachute.   SW left HHA list in room for review.   [name], MSW, [place] Office: [phone] Cell: [phone] Fax: [phone]

## 2022-05-28 NOTE — Progress Notes (Signed)
Occupational Therapy Session Note  Patient Details  Name: Miranda Roman MRN: [id number] Date of Birth: [birth year]  Today's Date: 05/28/2022 OT Individual Time: 1300-1345 OT Individual Time Calculation (min): 45 min    Short Term Goals: Week 2:  OT Short Term Goal 1 (Week 2): Pt will complete LB bathing task with Max A at bed level. OT Short Term Goal 2 (Week 2): Pt will complete LB bathing task with Max A at bed level.  Skilled Therapeutic Interventions/Progress Updates:    Pt resting in bed upon arrival. OT intervention with focus on bed mobility, sit<>stand, standing balance, SB tranfsers, and LUE therex. Supine>sit EOB with mod A. SB transfer with tot A+1. Sit<>stand with mod A +2. LUE therex with 3# bar bell: punches 3x10 and curls 3x10. Pt returned to room and reamined in recliner with care giver present. All needs within reach.   Therapy Documentation Precautions:  Precautions Precautions: Fall Precaution Comments: R knee pain - hold leg up Required Braces or Orthoses: Splint/Cast Splint/Cast: RUE and RLE Restrictions Weight Bearing Restrictions: Yes RUE Weight Bearing: Weight bear through elbow only RLE Weight Bearing: Non weight bearing Other Position/Activity Restrictions: Can bear weight through right elbow, not wrist General:   Vital Signs:  Pain:  Pt denies pain except during transitional movements; repositoined  Therapy/Group: Individual Therapy  [name] 05/28/2022, 2:40 PM

## 2022-05-28 NOTE — Progress Notes (Signed)
PROGRESS NOTE   Subjective/Complaints:  LBM this AM- on bedpan still- had without accident this AM.   Slept well- still depressed.    ROS:  Pt denies SOB, abd pain, CP, N/V/C/D, and vision changes   Objective:   No results found. Recent Labs    05/26/22 0705  WBC 8.1  HGB 8.8*  HCT 27.4*  PLT 470*   Recent Labs    05/26/22 0705 05/28/22 0555  NA 139 141  K 4.1 4.5  CL 109 110  CO2 21* 23  GLUCOSE 87 92  BUN 37* 39*  CREATININE 2.60* 2.84*  CALCIUM 8.2* 8.3*    Intake/Output Summary (Last 24 hours) at 05/28/2022 0904 Last data filed at 05/28/2022 0800 Gross per 24 hour  Intake 1011 ml  Output --  Net 1011 ml        Physical Exam: Vital Signs Blood pressure (!) 150/88, pulse (!) 102, temperature 99.9 F (37.7 C), temperature source Oral, resp. rate 14, height _0  (1.626 m), weight 78.9 kg, SpO2 100 %.        General: awake, alert, appropriate, on bedpan; NAD HENT: conjugate gaze; oropharynx a little dry CV: regular to borderline tachycardic rate; no JVD Pulmonary: CTA B/L; no W/R/R- good air movement GI: soft, NT, ND, (+)BS- hypoactive- just had BM Psychiatric: appropriate- flat- still depressed Neurological: alert Musculoskeletal:     Cervical back: Normal range of motion.     Comments: Right lower leg surgical dressing in place; right forearm splint in place with ACE wrap. RUE tender to minimal palpation and movement.  Can wiggle toes on RLE- no change Skin:    General: Skin is warm and dry Neurological:     Mental Status: She is alert and oriented to person, place, and time.     Comments: Underlying cognitive impairment. Oriented to person, place, reason she's here. Fair insight and awareness. Normal speech, language. CN exam unremarkable. LUE 4/5. RUE limited by pain/ortho. Does move all muscle groups. LLE 2/5 prox to 4/5 distal. RLE tr-1/5 prox to 3/5 distal (can wiggle toes within  splint). No focal sensory loss.     Assessment/Plan: 1. Functional deficits which require 3+ hours per day of interdisciplinary therapy in a comprehensive inpatient rehab setting. Physiatrist is providing close team supervision and 24 hour management of active medical problems listed below. Physiatrist and rehab team continue to assess barriers to discharge/monitor patient progress toward functional and medical goals  Care Tool:  Bathing    Body parts bathed by patient: Face, Front perineal area, Abdomen, Chest, Right arm   Body parts bathed by helper: Buttocks, Left arm Body parts n/a: Right lower leg   Bathing assist Assist Level: Maximal Assistance - Patient 24 - 49%     Upper Body Dressing/Undressing Upper body dressing   What is the patient wearing?: Pull over shirt    Upper body assist Assist Level: Moderate Assistance - Patient 50 - 74%    Lower Body Dressing/Undressing Lower body dressing      What is the patient wearing?: Incontinence brief, Pants     Lower body assist Assist for lower body dressing: Dependent - Patient 0%  Toileting Toileting    Toileting assist Assist for toileting: 2 Helpers     Transfers Chair/bed transfer  Transfers assist  Chair/bed transfer activity did not occur: Safety/medical concerns  Chair/bed transfer assist level: 2 Helpers (slide board)     Locomotion Ambulation   Ambulation assist   Ambulation activity did not occur: Safety/medical concerns          Walk 10 feet activity   Assist  Walk 10 feet activity did not occur: Safety/medical concerns        Walk 50 feet activity   Assist Walk 50 feet with 2 turns activity did not occur: Safety/medical concerns         Walk 150 feet activity   Assist Walk 150 feet activity did not occur: Safety/medical concerns         Walk 10 feet on uneven surface  activity   Assist Walk 10 feet on uneven surfaces activity did not occur: Safety/medical  concerns         Wheelchair     Assist Is the patient using a wheelchair?: Yes Type of Wheelchair: Manual Wheelchair activity did not occur: Safety/medical concerns  Wheelchair assist level: Moderate Assistance - Patient 50 - 74% Max wheelchair distance: 28f    Wheelchair 50 feet with 2 turns activity    Assist        Assist Level: Dependent - Patient 0%   Wheelchair 150 feet activity     Assist      Assist Level: Dependent - Patient 0%    Medical Problem List and Plan: 1. Functional deficits secondary to multiple trauma after ground level fall             -patient may not yet shower             -ELOS/Goals: 12-14 days, min assist to supervision goals  Con't CIR- PT, OT   D/c dat 12/1  Con't CIR- PT, OT and SLP 2.  Antithrombotics: -DVT/anticoagulation:  Pharmaceutical: Lovenox 30 mg daily due to renal impairment -venous dopplers appear normal (where able to test given RLE splint)             -antiplatelet therapy: none 3. Pain Management: Tylenol, tramadol, Robaxin prn  11/14- will add Norco 5/325 mg q6 hours prn for pain and monitor cognition- cannot increase tramadol due to renal impairment- will also add Lidoderm patches 3 patches 12 hrs on; 12 hrs off- 8pm-8am  11/15- slept better due to better pain control- con't regimen per nursing, didn't get loopy with meds  11/16- pain well controlled per pt  11/20- due to renal issues, stopped Ultracet TID- can only receive 2 doses/day 4. Mood/Behavior/Sleep: LCSW to evaluate and provide emotional support  11/21- will add Prozac 10 mg daily             -antipsychotic agents: n/a 5. Neuropsych/cognition: This patient is not capable of making decisions on her own behalf. Consider SLP eval.  11/16- sent SLP eval 6. Skin/Wound Care: Routine skin care checks             -monitor surgical incision 7. Fluids/Electrolytes/Nutrition: Routine Is and Os and follow-up chemistries             -ensure adequate PO  hydration 8: Right tib-fib fracture s/p ORIF 11/2 -NWB RLE  -follow-up with Dr. LMardelle Matte9: Right non-displaced wrist fracture: WBAT tolerated through elbow             -maintain wrist splint             -  follow-up with Dr. 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  13: Hyperlipidemia: continue Lipitor 14: DM2: CBGs 4 times daily             -continue Levemir 10 units daily             -continue sliding scale             -consider restarting home insulin 4 units with meals  11/14- will monitor trend- is 91-223 in last 24 hours, but give 1 more day before adding insulin  11/15- Had only 1 spike in last 24 hours- will monitor  11/17- CBGs looking great- con't regimen  11/18 one elevated CBG of 208, overall well controlled, continue to monitor  11/20- CBG's are great in AM, but spike after meals- will try Tradjenta since not renally dosed- 5 mg daily- will monitor for hypoglycemia. If doesn't work, will restart insulin with meals  11/21- CBG dropped to 61 yesterday- will stop Tradjenta and wait to see how CBGs do before making more changes  11/22- CBGs dropped to 60 again after tradgenta stopped- changed SSI to sensitive 15: Constipation: continue senna and Colace daily; prns 16: Anemia; multi-factorial: follow-up CBC 17: Leukocytosis/low grade fever: abx for pneumonia continue  11/14- WBC down to 15k from 16.4- on Rocephin  11/15- recheck in AM  11/16- WBC 10.3-  11/20- WBC down to 8.1 18: AKI: in setting of CKD4 serum creatinine improving, still elevated (baseline 2.3-2.6)             -follow-up on BMP  11/14- Cr down to 2.74 from 3.04- so heading in right direction    11/15- recheck in AM  11/16- Cr 2.37- will stop IVFs since at baseline  11/20- Cr up a little to 2.6- will let push fluids and recheck Wednesday  11/21- labs in AM  11/22- Cr 2.84 and BUN 39- will push fluids- if doesn't improve tomorrow, will give some more IVFs.  19. Confusion  11/16- will check U/A and Cx since family says confusion worse/not at baseline- esp since flooding bed multiple times per day  11/17- U/A not back- will order again just to make sure  -spoke with charge nurse- they can cath her if need be to get specimen.   -11/18 Urine culture with no growth 20. Allergies  11/20- will write for pt's Zyrtec - brought from home. and stop Claritin 21. Depression  11/21- will start Prozac 10 mg daily-        LOS: 9 days A FACE TO FACE EVALUATION WAS PERFORMED  Tyrel Lex 05/28/2022, 9:04 AM

## 2022-05-29 LAB — BASIC METABOLIC PANEL
Anion gap: 8 (ref 5–15)
BUN: 47 mg/dL — ABNORMAL HIGH (ref 8–23)
CO2: 23 mmol/L (ref 22–32)
Calcium: 8.3 mg/dL — ABNORMAL LOW (ref 8.9–10.3)
Chloride: 109 mmol/L (ref 98–111)
Creatinine, Ser: 2.99 mg/dL — ABNORMAL HIGH (ref 0.44–1.00)
GFR, Estimated: 16 mL/min — ABNORMAL LOW (ref >60)
Glucose, Bld: 100 mg/dL — ABNORMAL HIGH (ref 70–99)
Potassium: 4.7 mmol/L (ref 3.5–5.1)
Sodium: 140 mmol/L (ref 135–145)

## 2022-05-29 MED ORDER — SODIUM CHLORIDE 0.9 % IV SOLN: INTRAVENOUS | Status: AC

## 2022-05-29 NOTE — Progress Notes (Signed)
PROGRESS NOTE   Subjective/Complaints:  Pt reports doesn't want to eat right now- asking nursing to take tray back to keep warm for now.   LBM yesterday But needs to have one now.  Room and pt are warmer.   Was water at bedside, but due to rising Cr/BUN, appears pt likely not drinking ROS:  Pt denies SOB, abd pain, CP, N/V/C/D, and vision changes   Objective:   No results found. No results for input(s): "WBC", "HGB", "HCT", "PLT" in the last 72 hours.  Recent Labs    05/28/22 0555 05/29/22 0500  NA 141 140  K 4.5 4.7  CL 110 109  CO2 23 23  GLUCOSE 92 100*  BUN 39* 47*  CREATININE 2.84* 2.99*  CALCIUM 8.3* 8.3*    Intake/Output Summary (Last 24 hours) at 05/29/2022 0825 Last data filed at 05/29/2022 0550 Gross per 24 hour  Intake 358 ml  Output 250 ml  Net 108 ml        Physical Exam: Vital Signs Blood pressure (!) 142/68, pulse 89, temperature 98.6 F (37 C), temperature source Oral, resp. rate 20, height _0  (1.626 m), weight 78.9 kg, SpO2 97 %.         General: awake, alert, appropriate, sitting up in bed; but was sleeping;  NAD HENT: conjugate gaze; oropharynx dry CV: regular rate; no JVD Pulmonary: CTA B/L; no W/R/R- good air movement GI: soft, NT, ND, (+)BS- a little hyperactive Psychiatric: appropriate- still flat Neurological: delayed- chronic Extremities:Tenting in hands Musculoskeletal:     Cervical back: Normal range of motion.     Comments: Right lower leg surgical dressing in place; right forearm splint in place with ACE wrap. RUE tender to minimal palpation and movement.  Can wiggle toes on RLE- no change Skin:    General: Skin is warm and dry Neurological:     Mental Status: She is alert and oriented to person, place, and time.     Comments: Underlying cognitive impairment. Oriented to person, place, reason she's here. Fair insight and awareness. Normal speech, language.  CN exam unremarkable. LUE 4/5. RUE limited by pain/ortho. Does move all muscle groups. LLE 2/5 prox to 4/5 distal. RLE tr-1/5 prox to 3/5 distal (can wiggle toes within splint). No focal sensory loss.     Assessment/Plan: 1. Functional deficits which require 3+ hours per day of interdisciplinary therapy in a comprehensive inpatient rehab setting. Physiatrist is providing close team supervision and 24 hour management of active medical problems listed below. Physiatrist and rehab team continue to assess barriers to discharge/monitor patient progress toward functional and medical goals  Care Tool:  Bathing    Body parts bathed by patient: Right arm, Left arm, Chest, Abdomen, Front perineal area, Right upper leg, Left upper leg, Face   Body parts bathed by helper: Buttocks, Left arm Body parts n/a: Right lower leg   Bathing assist Assist Level: Moderate Assistance - Patient 50 - 74%     Upper Body Dressing/Undressing Upper body dressing   What is the patient wearing?: Pull over shirt    Upper body assist Assist Level: Minimal Assistance - Patient > 75%    Lower Body  Dressing/Undressing Lower body dressing      What is the patient wearing?: Incontinence brief, Pants     Lower body assist Assist for lower body dressing: Total Assistance - Patient < 25%     Toileting Toileting    Toileting assist Assist for toileting: 2 Helpers     Transfers Chair/bed transfer  Transfers assist  Chair/bed transfer activity did not occur: Safety/medical concerns  Chair/bed transfer assist level: 2 Helpers (slide board)     Locomotion Ambulation   Ambulation assist   Ambulation activity did not occur: Safety/medical concerns          Walk 10 feet activity   Assist  Walk 10 feet activity did not occur: Safety/medical concerns        Walk 50 feet activity   Assist Walk 50 feet with 2 turns activity did not occur: Safety/medical concerns         Walk 150 feet  activity   Assist Walk 150 feet activity did not occur: Safety/medical concerns         Walk 10 feet on uneven surface  activity   Assist Walk 10 feet on uneven surfaces activity did not occur: Safety/medical concerns         Wheelchair     Assist Is the patient using a wheelchair?: Yes Type of Wheelchair: Manual Wheelchair activity did not occur: Safety/medical concerns  Wheelchair assist level: Moderate Assistance - Patient 50 - 74% Max wheelchair distance: 34f    Wheelchair 50 feet with 2 turns activity    Assist        Assist Level: Dependent - Patient 0%   Wheelchair 150 feet activity     Assist      Assist Level: Dependent - Patient 0%    Medical Problem List and Plan: 1. Functional deficits secondary to multiple trauma after ground level fall             -patient may not yet shower             -ELOS/Goals: 12-14 days, min assist to supervision goals  Con't CIR- PT, OT   D/c dat 12/1  Con't CIR- PT, OT and SLP 2.  Antithrombotics: -DVT/anticoagulation:  Pharmaceutical: Lovenox 30 mg daily due to renal impairment -venous dopplers appear normal (where able to test given RLE splint)             -antiplatelet therapy: none 3. Pain Management: Tylenol, tramadol, Robaxin prn  11/14- will add Norco 5/325 mg q6 hours prn for pain and monitor cognition- cannot increase tramadol due to renal impairment- will also add Lidoderm patches 3 patches 12 hrs on; 12 hrs off- 8pm-8am  11/15- slept better due to better pain control- con't regimen per nursing, didn't get loopy with meds  11/16- pain well controlled per pt  11/20- due to renal issues, stopped Ultracet TID- can only receive 2 doses/day 4. Mood/Behavior/Sleep: LCSW to evaluate and provide emotional support  11/21- will add Prozac 10 mg daily             -antipsychotic agents: n/a 5. Neuropsych/cognition: This patient is not capable of making decisions on her own behalf. Consider SLP  eval.  11/16- sent SLP eval 6. Skin/Wound Care: Routine skin care checks             -monitor surgical incision 7. Fluids/Electrolytes/Nutrition: Routine Is and Os and follow-up chemistries             -ensure adequate PO hydration 8:  Right tib-fib fracture s/p ORIF 11/2 -NWB RLE  -follow-up with Dr. Mardelle Matte 9: Right non-displaced wrist fracture: WBAT tolerated through elbow             -maintain wrist splint             -follow-up with Dr. 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   11/23- Cr up to 2.99 and BUN up to 47 from 32- will start IVFs NS 75cc/hour x 24 hours and recheck in AM- will d/w nursing to push PO fluids at all times.  13: Hyperlipidemia: continue Lipitor 14: DM2: CBGs 4 times daily             -continue Levemir 10 units daily             -continue sliding scale             -consider restarting home insulin 4 units with meals  11/14- will monitor trend- is 91-223 in last 24 hours, but give 1 more day before adding insulin  11/15- Had only 1 spike in last 24 hours- will monitor  11/17- CBGs looking great- con't regimen  11/18 one elevated CBG of 208, overall well controlled, continue to monitor  11/20- CBG's are great in AM, but spike after meals- will try Tradjenta since not renally dosed- 5 mg daily- will monitor for hypoglycemia. If doesn't work, will restart insulin with meals  11/21- CBG dropped to 47 yesterday- will stop Tradjenta and wait to see how CBGs do before making more changes  11/22- CBGs dropped to 60 again after tradgenta stopped- changed SSI to sensitive  11/23- Doing better off Tradjenta 15: Constipation: continue senna and Colace  daily; prns  11/23- BM's yesterday and this AM 16: Anemia; multi-factorial: follow-up CBC 17: Leukocytosis/low grade fever: abx for pneumonia continue  11/14- WBC down to 15k from 16.4- on Rocephin  11/15- recheck in AM  11/16- WBC 10.3-  11/20- WBC down to 8.1 18: AKI: in setting of CKD4 serum creatinine improving, still elevated (baseline 2.3-2.6)             -follow-up on BMP  11/14- Cr down to 2.74 from 3.04- so heading in right direction   11/15- recheck in AM  11/16- Cr 2.37- will stop IVFs since at baseline  11/20- Cr up a little to 2.6- will let push fluids and recheck Wednesday  11/21- labs in AM  11/22- Cr 2.84 and BUN 39- will push fluids- if doesn't improve tomorrow, will give some more IVFs. 11/23- will give IVFs for increasing Cr/BUN-  19. Confusion  11/16- will check U/A and Cx since family says confusion worse/not at baseline- esp since flooding bed multiple times per day  11/17- U/A not back- will order again just to make sure  -spoke with charge nurse- they can cath her if need be to get specimen.   -11/18 Urine culture with no growth 20. Allergies  11/20- will write for pt's Zyrtec - brought from home. and stop Claritin 21. Depression  11/21- will start Prozac 10 mg daily-   11/23- not drinking- will ask nursing to push fluids  I spent a total of 37   minutes on total care today- >50% coordination of care- due to d/w nursing about IVFs and pushing fluids    LOS: 10 days A FACE TO FACE EVALUATION WAS PERFORMED  Miranda Roman 05/29/2022, 8:25 AM

## 2022-05-30 LAB — BASIC METABOLIC PANEL
Anion gap: 9 (ref 5–15)
BUN: 41 mg/dL — ABNORMAL HIGH (ref 8–23)
CO2: 20 mmol/L — ABNORMAL LOW (ref 22–32)
Calcium: 8.5 mg/dL — ABNORMAL LOW (ref 8.9–10.3)
Chloride: 110 mmol/L (ref 98–111)
Creatinine, Ser: 2.41 mg/dL — ABNORMAL HIGH (ref 0.44–1.00)
GFR, Estimated: 20 mL/min — ABNORMAL LOW (ref >60)
Glucose, Bld: 143 mg/dL — ABNORMAL HIGH (ref 70–99)
Potassium: 4.8 mmol/L (ref 3.5–5.1)
Sodium: 139 mmol/L (ref 135–145)

## 2022-05-30 MED ORDER — SODIUM CHLORIDE 0.9 % IV SOLN: INTRAVENOUS | Status: DC

## 2022-05-30 NOTE — Plan of Care (Signed)
  Problem: Consults Goal: RH GENERAL PATIENT EDUCATION Description: See Patient Education module for education specifics. Outcome: Progressing   Problem: RH BOWEL ELIMINATION Goal: RH STG MANAGE BOWEL WITH ASSISTANCE Description: STG Manage Bowel with min Assistance. Outcome: Progressing Goal: RH STG MANAGE BOWEL W/MEDICATION W/ASSISTANCE Description: STG Manage Bowel with Medication with min Assistance. Outcome: Progressing   Problem: RH BLADDER ELIMINATION Goal: RH STG MANAGE BLADDER WITH ASSISTANCE Description: STG Manage Bladder With min Assistance Outcome: Progressing   Problem: RH SKIN INTEGRITY Goal: RH STG SKIN FREE OF INFECTION/BREAKDOWN Description: Skin will be free of any additional infection/breakdown with min assist Outcome: Progressing Goal: RH STG MAINTAIN SKIN INTEGRITY WITH ASSISTANCE Description: STG Maintain Skin Integrity With min Assistance. Outcome: Progressing   Problem: RH SAFETY Goal: RH STG ADHERE TO SAFETY PRECAUTIONS W/ASSISTANCE/DEVICE Description: STG Adhere to Safety Precautions With cueing Assistance/Device. Outcome: Progressing   Problem: RH PAIN MANAGEMENT Goal: RH STG PAIN MANAGED AT OR BELOW PT'S PAIN GOAL Description: Pain will be managed at 4 out of 10 on pain scale with prn medications min assist Outcome: Progressing   Problem: RH KNOWLEDGE DEFICIT GENERAL Goal: RH STG INCREASE KNOWLEDGE OF SELF CARE AFTER HOSPITALIZATION Description: Patient/caregiver will be able to manage medications and self care from nursing education and handouts independently  Outcome: Progressing   Problem: Education: Goal: Ability to describe self-care measures that may prevent or decrease complications (Diabetes Survival Skills Education) will improve Outcome: Progressing Goal: Individualized Educational Video(s) Outcome: Progressing   Problem: Coping: Goal: Ability to adjust to condition or change in health will improve Outcome: Progressing    Problem: Fluid Volume: Goal: Ability to maintain a balanced intake and output will improve Outcome: Progressing   Problem: Health Behavior/Discharge Planning: Goal: Ability to identify and utilize available resources and services will improve Outcome: Progressing Goal: Ability to manage health-related needs will improve Outcome: Progressing   Problem: Metabolic: Goal: Ability to maintain appropriate glucose levels will improve Outcome: Progressing   Problem: Nutritional: Goal: Maintenance of adequate nutrition will improve Outcome: Progressing Goal: Progress toward achieving an optimal weight will improve Outcome: Progressing   Problem: Skin Integrity: Goal: Risk for impaired skin integrity will decrease Outcome: Progressing   Problem: Tissue Perfusion: Goal: Adequacy of tissue perfusion will improve Outcome: Progressing

## 2022-05-30 NOTE — Progress Notes (Signed)
PROGRESS NOTE   Subjective/Complaints:  Pt reports LBM this AM.  Had no other complaints.   Spoke with daughter- and answered all questions.  She was comfortable with plan.  ROS:   Pt denies SOB, abd pain, CP, N/V/C/D, and vision changes   Objective:   No results found. No results for input(s): "WBC", "HGB", "HCT", "PLT" in the last 72 hours.  Recent Labs    05/29/22 0500 05/30/22 0945  NA 140 139  K 4.7 4.8  CL 109 110  CO2 23 20*  GLUCOSE 100* 143*  BUN 47* 41*  CREATININE 2.99* 2.41*  CALCIUM 8.3* 8.5*    Intake/Output Summary (Last 24 hours) at 05/30/2022 1043 Last data filed at 05/30/2022 0612 Gross per 24 hour  Intake 598 ml  Output 200 ml  Net 398 ml        Physical Exam: Vital Signs Blood pressure (!) 159/77, pulse 86, temperature 98.7 F (37.1 C), temperature source Oral, resp. rate 18, height _0  (1.626 m), weight 78.9 kg, SpO2 100 %.          General: awake, alert, appropriate, sad in hospital, but otherwise, appropriate,  NAD HENT: conjugate gaze; oropharynx moist CV: regular rate; no JVD Pulmonary: CTA B/L; no W/R/R- good air movement GI: soft, NT, ND, (+)BS Psychiatric: appropriate- flat Neurological: slightly delayed- chronic Extremities:Tenting improved in hands Musculoskeletal:     Cervical back: Normal range of motion.     Comments: Right lower leg surgical dressing in place; right forearm splint in place with ACE wrap. RUE tender to minimal palpation and movement.  Can wiggle toes on RLE- no change Skin:    General: Skin is warm and dry Neurological:     Mental Status: She is alert and oriented to person, place, and time.     Comments: Underlying cognitive impairment. Oriented to person, place, reason she's here. Fair insight and awareness. Normal speech, language. CN exam unremarkable. LUE 4/5. RUE limited by pain/ortho. Does move all muscle groups. LLE 2/5 prox to  4/5 distal. RLE tr-1/5 prox to 3/5 distal (can wiggle toes within splint). No focal sensory loss.     Assessment/Plan: 1. Functional deficits which require 3+ hours per day of interdisciplinary therapy in a comprehensive inpatient rehab setting. Physiatrist is providing close team supervision and 24 hour management of active medical problems listed below. Physiatrist and rehab team continue to assess barriers to discharge/monitor patient progress toward functional and medical goals  Care Tool:  Bathing    Body parts bathed by patient: Right arm, Left arm, Chest, Abdomen, Front perineal area, Right upper leg, Left upper leg, Face   Body parts bathed by helper: Buttocks, Left arm Body parts n/a: Right lower leg   Bathing assist Assist Level: Moderate Assistance - Patient 50 - 74%     Upper Body Dressing/Undressing Upper body dressing   What is the patient wearing?: Pull over shirt    Upper body assist Assist Level: Minimal Assistance - Patient > 75%    Lower Body Dressing/Undressing Lower body dressing      What is the patient wearing?: Incontinence brief, Pants     Lower body assist Assist for  lower body dressing: Total Assistance - Patient < 25%     Toileting Toileting    Toileting assist Assist for toileting: 2 Helpers     Transfers Chair/bed transfer  Transfers assist  Chair/bed transfer activity did not occur: Safety/medical concerns  Chair/bed transfer assist level: 2 Helpers (slide board)     Locomotion Ambulation   Ambulation assist   Ambulation activity did not occur: Safety/medical concerns          Walk 10 feet activity   Assist  Walk 10 feet activity did not occur: Safety/medical concerns        Walk 50 feet activity   Assist Walk 50 feet with 2 turns activity did not occur: Safety/medical concerns         Walk 150 feet activity   Assist Walk 150 feet activity did not occur: Safety/medical concerns         Walk 10  feet on uneven surface  activity   Assist Walk 10 feet on uneven surfaces activity did not occur: Safety/medical concerns         Wheelchair     Assist Is the patient using a wheelchair?: Yes Type of Wheelchair: Manual Wheelchair activity did not occur: Safety/medical concerns  Wheelchair assist level: Moderate Assistance - Patient 50 - 74% Max wheelchair distance: 49f    Wheelchair 50 feet with 2 turns activity    Assist        Assist Level: Dependent - Patient 0%   Wheelchair 150 feet activity     Assist      Assist Level: Dependent - Patient 0%    Medical Problem List and Plan: 1. Functional deficits secondary to multiple trauma after ground level fall             -patient may not yet shower             -ELOS/Goals: 12-14 days, min assist to supervision goals  D/c 12/1  Con't PT, OT and SLP 2.  Antithrombotics: -DVT/anticoagulation:  Pharmaceutical: Lovenox 30 mg daily due to renal impairment -venous dopplers appear normal (where able to test given RLE splint)             -antiplatelet therapy: none 3. Pain Management: Tylenol, tramadol, Robaxin prn  11/14- will add Norco 5/325 mg q6 hours prn for pain and monitor cognition- cannot increase tramadol due to renal impairment- will also add Lidoderm patches 3 patches 12 hrs on; 12 hrs off- 8pm-8am  11/15- slept better due to better pain control- con't regimen per nursing, didn't get loopy with meds  11/16- pain well controlled per pt  11/20- due to renal issues, stopped Ultracet TID- can only receive 2 doses/day 4. Mood/Behavior/Sleep: LCSW to evaluate and provide emotional support  11/21- will add Prozac 10 mg daily             -antipsychotic agents: n/a 5. Neuropsych/cognition: This patient is not capable of making decisions on her own behalf. Consider SLP eval.  11/16- sent SLP eval 6. Skin/Wound Care: Routine skin care checks             -monitor surgical incision 7.  Fluids/Electrolytes/Nutrition: Routine Is and Os and follow-up chemistries             -ensure adequate PO hydration 8: Right tib-fib fracture s/p ORIF 11/2 -NWB RLE  -follow-up with Dr. LMardelle Matte9: Right non-displaced wrist fracture: WBAT tolerated through elbow             -  maintain wrist splint             -follow-up with Dr. 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   11/23- Cr up to 2.99 and BUN up to 47 from 32- will start IVFs NS 75cc/hour x 24 hours and recheck in AM- will d/w nursing to push PO fluids at all times.   11/24- Cr 2.21- BUN still 41- will give less IVFs for another 24 hours- and recheck labs in AM-  13: Hyperlipidemia: continue Lipitor 14: DM2: CBGs 4 times daily             -continue Levemir 10 units daily             -continue sliding scale             -consider restarting home insulin 4 units with meals  11/14- will monitor trend- is 91-223 in last 24 hours, but give 1 more day before adding insulin  11/15- Had only 1 spike in last 24 hours- will monitor  11/17- CBGs looking great- con't regimen  11/18 one elevated CBG of 208, overall well controlled, continue to monitor  11/20- CBG's are great in AM, but spike after meals- will try Tradjenta since not renally dosed- 5 mg daily- will monitor for hypoglycemia. If doesn't work, will restart insulin with meals  11/21- CBG dropped to 47 yesterday- will stop Tradjenta and wait to see how CBGs do before making more changes  11/22- CBGs dropped to 60 again after tradgenta stopped- changed SSI to sensitive  11/23- Doing better off Tradjenta  11/24- CBGs looking much better- con't regimen 15: Constipation:  continue senna and Colace daily; prns  11/23- BM's yesterday and this AM 16: Anemia; multi-factorial: follow-up CBC 17: Leukocytosis/low grade fever: abx for pneumonia continue  11/14- WBC down to 15k from 16.4- on Rocephin  11/15- recheck in AM  11/16- WBC 10.3-  11/20- WBC down to 8.1 18: AKI: in setting of CKD4 serum creatinine improving, still elevated (baseline 2.3-2.6)             -follow-up on BMP  11/14- Cr down to 2.74 from 3.04- so heading in right direction   11/15- recheck in AM  11/16- Cr 2.37- will stop IVFs since at baseline  11/20- Cr up a little to 2.6- will let push fluids and recheck Wednesday  11/21- labs in AM  11/22- Cr 2.84 and BUN 39- will push fluids- if doesn't improve tomorrow, will give some more IVFs. 11/23- will give IVFs for increasing Cr/BUN-   11/24- Cr down to 2.41 and BUN 41- will redo IVFs for 1 more day and recheck in AM- baseline Cr ~ 2.2- will give 50cc/hour and start at 2pm to avoid therapies.  19. Confusion  11/16- will check U/A and Cx since family says confusion worse/not at baseline- esp since flooding bed multiple times per day  11/17- U/A not back- will order again just to make sure  -spoke with charge nurse- they can cath her if need be to get specimen.   -11/18 Urine culture with no growth 20. Allergies  11/20- will write for pt's Zyrtec -  brought from home. and stop Claritin 21. Depression  11/21- will start Prozac 10 mg daily-   11/23- not drinking- will ask nursing to push fluids    I spent a total of 39   minutes on total care today- >50% coordination of care- due to dw/ daughter and pt and nursing about pushing fluids and     LOS: 11 days A FACE TO FACE EVALUATION WAS PERFORMED  Miranda Roman 05/30/2022, 10:43 AM

## 2022-05-30 NOTE — Progress Notes (Signed)
Occupational Therapy Session Note  Patient Details  Name: ARON NEEDLES MRN: [id number] Date of Birth: 1945-11-21  Today's Date: 05/30/2022 OT Individual Time: 7116-5790 OT Individual Time Calculation (min): 75 min    Short Term Goals: Week 2:  OT Short Term Goal 1 (Week 2): Pt will complete LB bathing task with Max A at bed level. OT Short Term Goal 2 (Week 2): Pt will complete LB bathing task with Max A at bed level.  Skilled Therapeutic Interventions/Progress Updates:    Pt resting in bed upon arrival. Pt finished eating yogurt and engaged in bathing/dressing at bed level and seated EOB. OT intervention with focus on bed mobility, supine>sit EOB, sitting balance, SB transfers, UB bathing/dressing, and LUE therex to increase independence with BADLs. Rolling to Rt with min A and mod A rolling to Lt to facilitate LB bathing/dressing. Pt incontinent of bladder and required max A for toileting at bed level. LB dressing with tot A. Supine>sit EOB with mod A. Sitting balance with CGA when donning sweatshirt. Min A for UB bathing/dressing seated EOB. SB transfer to w/c with tot A. LUE therex with yellow theraband for joint mobility and shoulder strengthening. Pt returned to room and remained in w/c. Belt alarm activated. All needs within reach.   Therapy Documentation Precautions:  Precautions Precautions: Fall Precaution Comments: R knee pain - hold leg up Required Braces or Orthoses: Splint/Cast Splint/Cast: RUE and RLE Restrictions Weight Bearing Restrictions: Yes RUE Weight Bearing: Weight bear through elbow only RLE Weight Bearing: Non weight bearing Other Position/Activity Restrictions: Can bear weight through right elbow, not wrist Pain:  Pt denies pain at rest  Therapy/Group: Individual Therapy  [name] 05/30/2022, 9:57 AM

## 2022-05-30 NOTE — Progress Notes (Signed)
Physical Therapy Session Note  Patient Details  Name: Miranda Roman MRN: [id number] Date of Birth: 11-16-1945  Today's Date: 05/30/2022 PT Individual Time: 1021-1130 and 1303-1350 PT Individual Time Calculation (min): 69 min   Short Term Goals: Week 2:  PT Short Term Goal 1 (Week 2): =LTGs d/t ELOS  Skilled Therapeutic Interventions/Progress Updates: Tx1: Pt presented in w/c agreeable to therapy. Pt indicating urgent need for BM. Performed Sit to stand with modA x 2 and w/c replaced with BSC with total A for LB clothing management. Once completed pt performed Sit to stand in same manner and PTA performed peri-care total A with clothing management total A by NT. W/c then placed behind pt to return to sitting. Pt then transported to day room and pt participated in seated therex including AA LAQ RLE x 15, LAQ with 2.5lb dowel x 15, and seated hip flexion bilaterally x 20. Pt then attempted Sit to stand from w/c with +1 assist with pt able to initiate however unable to come to full erect stand despite max multimodal cues. Pt was able to complete stand x 2 with modA x 2 and tolerated standing each bout ~1 min each. On second stand PTA noted that pt with decreased pressure on PTA's foot with RLE. Pt then transported to [facility] and pt participated in 3 min at 60cm/sec LLE only with PTA pushing contralateral side. Pt noted with increased fatigue nearing end of third minute. Pt then transported back to room and performed stand in same manner as prior to transfer pt from w/c to recliner. Pt set up in recliner at end of session with legs elevated, call bell within reach and current needs met.   Tx2: Pt presented in recliner with dgt present agreeable to therapy. Pt denies pain at rest but c/o pain when RLE mobility in knee. Pt transported to rehab gym in recliner for time management. Pt required increased time and cues to scoot forward in recliner using R elbow and LUE. Pt then participated in x 3 trials of  standing requiring modA x 2 from recliner and PTA having pt reach for squigz placed in various places on mirror. Pt was able to tolerate with max encouragement to maintain standing approx 2 min each bout. With fatigue pt noted increased forward flexion and although PTA had foot under pt's RLE to maintain wt bearing precautions noted increased pressure. With max multimodal cues pt was able to fully extend L knee as well as improve erect posture however it was difficult for pt to maintain. Pt was then transported back to room and performed stand in same manner as prior recliner was switched out for bed. Pt was able to perform sit to supine via sidelying with light modA for RLE management. Pt noted to be incontinent of bladder but was able to perform partial bridge to allow PTA to remove pants. Pt left in bed at end of session with NT notified of pt's disposition with call bell within reach and family present.      Therapy Documentation Precautions:  Precautions Precautions: Fall Precaution Comments: R knee pain - hold leg up Required Braces or Orthoses: Splint/Cast Splint/Cast: RUE and RLE Restrictions Weight Bearing Restrictions: Yes RUE Weight Bearing: Weight bearing as tolerated RLE Weight Bearing: Non weight bearing Other Position/Activity Restrictions: Can bear weight through right elbow, not wrist General:   Vital Signs: Therapy Vitals Pulse Rate: 86 BP: (!) 159/77 Pain: Pain Assessment Pain Scale: 0-10 Pain Score: 0-No pain Mobility:   Locomotion :  Trunk/Postural Assessment :    Balance:   Exercises:   Other Treatments:      Therapy/Group: Individual Therapy  Dushawn Pusey 05/30/2022, 12:18 PM

## 2022-05-31 LAB — URIC ACID: Uric Acid, Serum: 5.4 mg/dL (ref 2.5–7.1)

## 2022-05-31 LAB — CBC WITH DIFFERENTIAL/PLATELET
Abs Immature Granulocytes: 0.04 10*3/uL (ref 0.00–0.07)
Basophils Absolute: 0.1 10*3/uL (ref 0.0–0.1)
Basophils Relative: 1 %
Eosinophils Absolute: 0.3 10*3/uL (ref 0.0–0.5)
Eosinophils Relative: 3 %
HCT: 26.4 % — ABNORMAL LOW (ref 36.0–46.0)
Hemoglobin: 8.7 g/dL — ABNORMAL LOW (ref 12.0–15.0)
Immature Granulocytes: 0 %
Lymphocytes Relative: 22 %
Lymphs Abs: 2.3 10*3/uL (ref 0.7–4.0)
MCH: 25.1 pg — ABNORMAL LOW (ref 26.0–34.0)
MCHC: 33 g/dL (ref 30.0–36.0)
MCV: 76.1 fL — ABNORMAL LOW (ref 80.0–100.0)
Monocytes Absolute: 0.9 10*3/uL (ref 0.1–1.0)
Monocytes Relative: 9 %
Neutro Abs: 6.7 10*3/uL (ref 1.7–7.7)
Neutrophils Relative %: 65 %
Platelets: 449 10*3/uL — ABNORMAL HIGH (ref 150–400)
RBC: 3.47 MIL/uL — ABNORMAL LOW (ref 3.87–5.11)
RDW: 18.6 % — ABNORMAL HIGH (ref 11.5–15.5)
WBC: 10.3 10*3/uL (ref 4.0–10.5)
nRBC: 0 % (ref 0.0–0.2)

## 2022-05-31 LAB — BASIC METABOLIC PANEL
Anion gap: 9 (ref 5–15)
Anion gap: 9 (ref 5–15)
BUN: 42 mg/dL — ABNORMAL HIGH (ref 8–23)
BUN: 49 mg/dL — ABNORMAL HIGH (ref 8–23)
CO2: 20 mmol/L — ABNORMAL LOW (ref 22–32)
CO2: 21 mmol/L — ABNORMAL LOW (ref 22–32)
Calcium: 8.4 mg/dL — ABNORMAL LOW (ref 8.9–10.3)
Calcium: 8.5 mg/dL — ABNORMAL LOW (ref 8.9–10.3)
Chloride: 110 mmol/L (ref 98–111)
Chloride: 108 mmol/L (ref 98–111)
Creatinine, Ser: 2.4 mg/dL — ABNORMAL HIGH (ref 0.44–1.00)
Creatinine, Ser: 2.48 mg/dL — ABNORMAL HIGH (ref 0.44–1.00)
GFR, Estimated: 21 mL/min — ABNORMAL LOW (ref >60)
GFR, Estimated: 20 mL/min — ABNORMAL LOW (ref >60)
Glucose, Bld: 95 mg/dL (ref 70–99)
Glucose, Bld: 139 mg/dL — ABNORMAL HIGH (ref 70–99)
Potassium: 4.3 mmol/L (ref 3.5–5.1)
Potassium: 4.6 mmol/L (ref 3.5–5.1)
Sodium: 139 mmol/L (ref 135–145)
Sodium: 138 mmol/L (ref 135–145)

## 2022-05-31 MED ORDER — DICLOFENAC SODIUM 1 % EX GEL
2.0000 g | Freq: Four times a day (QID) | CUTANEOUS | Status: DC
  Administered 2022-05-31 – 2022-06-05 (×18): 2 g via TOPICAL
  Filled 2022-05-31: qty 100

## 2022-05-31 NOTE — Progress Notes (Addendum)
PROGRESS NOTE   Subjective/Complaints:  No events overnight.  Patient complains of some pain along her left medial great toe this a.m., believes it is from overgrown toenails.  Otherwise, no concerns, complaints.  ROS: Pt denies SOB, abd pain, CP, N/V/C/D, and vision changes   Objective:   No results found. No results for input(s): "WBC", "HGB", "HCT", "PLT" in the last 72 hours.  Recent Labs    05/30/22 0945 05/31/22 0705  NA 139 139  K 4.8 4.3  CL 110 110  CO2 20* 20*  GLUCOSE 143* 95  BUN 41* 42*  CREATININE 2.41* 2.40*  CALCIUM 8.5* 8.4*     Intake/Output Summary (Last 24 hours) at 05/31/2022 1514 Last data filed at 05/31/2022 1316 Gross per 24 hour  Intake 598 ml  Output 600 ml  Net -2 ml         Physical Exam: Vital Signs Blood pressure (!) 144/79, pulse 87, temperature 98.9 F (37.2 C), temperature source Oral, resp. rate 17, height _0  (1.626 m), weight 78.9 kg, SpO2 100 %.   Constitution: Appropriate appearance for age. No apparnet distress HEENT: PERRL, EOMI grossly intact.  Resp: CTAB. No rales, rhonchi, or wheezing. Cardio: RRR. No mumurs, rubs, or gallops. No peripheral edema. Abdomen: Nondistended. Nontender. +bowel sounds. Psych: Appropriate mood and affect. Neuro: AAOx4. No apparent deficits  MSK: RLE in dressing, wiggles toes, good capillary refill. LLE with mild erythema; no edema, or warmth; so signs ingrown toenail.    PE from prior encounter:    General: awake, alert, appropriate, sad in hospital, but otherwise, appropriate,  NAD HENT: conjugate gaze; oropharynx moist CV: regular rate; no JVD Pulmonary: CTA B/L; no W/R/R- good air movement GI: soft, NT, ND, (+)BS Psychiatric: appropriate- flat Neurological: slightly delayed- chronic Extremities:Tenting improved in hands Musculoskeletal:     Cervical back: Normal range of motion.     Comments: Right lower leg surgical  dressing in place; right forearm splint in place with ACE wrap. RUE tender to minimal palpation and movement.  Can wiggle toes on RLE- no change Skin:    General: Skin is warm and dry Neurological:     Mental Status: She is alert and oriented to person, place, and time.     Comments: Underlying cognitive impairment. Oriented to person, place, reason she's here. Fair insight and awareness. Normal speech, language. CN exam unremarkable. LUE 4/5. RUE limited by pain/ortho. Does move all muscle groups. LLE 2/5 prox to 4/5 distal. RLE tr-1/5 prox to 3/5 distal (can wiggle toes within splint). No focal sensory loss.     Assessment/Plan: 1. Functional deficits which require 3+ hours per day of interdisciplinary therapy in a comprehensive inpatient rehab setting. Physiatrist is providing close team supervision and 24 hour management of active medical problems listed below. Physiatrist and rehab team continue to assess barriers to discharge/monitor patient progress toward functional and medical goals  Care Tool:  Bathing    Body parts bathed by patient: Right arm, Left arm, Chest, Abdomen, Front perineal area, Right upper leg, Left upper leg, Face   Body parts bathed by helper: Buttocks, Left arm Body parts n/a: Right lower leg   Bathing assist  Assist Level: Moderate Assistance - Patient 50 - 74%     Upper Body Dressing/Undressing Upper body dressing   What is the patient wearing?: Pull over shirt    Upper body assist Assist Level: Minimal Assistance - Patient > 75%    Lower Body Dressing/Undressing Lower body dressing      What is the patient wearing?: Incontinence brief, Pants     Lower body assist Assist for lower body dressing: Total Assistance - Patient < 25%     Toileting Toileting    Toileting assist Assist for toileting: 2 Helpers     Transfers Chair/bed transfer  Transfers assist  Chair/bed transfer activity did not occur: Safety/medical concerns  Chair/bed  transfer assist level: 2 Helpers (slide board)     Locomotion Ambulation   Ambulation assist   Ambulation activity did not occur: Safety/medical concerns          Walk 10 feet activity   Assist  Walk 10 feet activity did not occur: Safety/medical concerns        Walk 50 feet activity   Assist Walk 50 feet with 2 turns activity did not occur: Safety/medical concerns         Walk 150 feet activity   Assist Walk 150 feet activity did not occur: Safety/medical concerns         Walk 10 feet on uneven surface  activity   Assist Walk 10 feet on uneven surfaces activity did not occur: Safety/medical concerns         Wheelchair     Assist Is the patient using a wheelchair?: Yes Type of Wheelchair: Manual Wheelchair activity did not occur: Safety/medical concerns  Wheelchair assist level: Moderate Assistance - Patient 50 - 74% Max wheelchair distance: 83f    Wheelchair 50 feet with 2 turns activity    Assist        Assist Level: Dependent - Patient 0%   Wheelchair 150 feet activity     Assist      Assist Level: Dependent - Patient 0%    Medical Problem List and Plan: 1. Functional deficits secondary to multiple trauma after ground level fall             -patient may not yet shower             -ELOS/Goals: 12-14 days, min assist to supervision goals  D/c 12/1  Con't PT, OT and SLP 2.  Antithrombotics: -DVT/anticoagulation:  Pharmaceutical: Lovenox 30 mg daily due to renal impairment -venous dopplers appear normal (where able to test given RLE splint)             -antiplatelet therapy: none 3. Pain Management: Tylenol, tramadol, Robaxin prn  11/14- will add Norco 5/325 mg q6 hours prn for pain and monitor cognition- cannot increase tramadol due to renal impairment- will also add Lidoderm patches 3 patches 12 hrs on; 12 hrs off- 8pm-8am  11/15- slept better due to better pain control- con't regimen per nursing, didn't get loopy  with meds  11/16- pain well controlled per pt  11/20- due to renal issues, stopped Ultracet TID- can only receive 2 doses/day  11/25 - L great toe pain with mild redness; on chart review Hx gout; will get AM CBC, uric acid; added voltaren to toe  4. Mood/Behavior/Sleep: LCSW to evaluate and provide emotional support  11/21- will add Prozac 10 mg daily             -antipsychotic agents: n/a 5. Neuropsych/cognition: This patient  is not capable of making decisions on her own behalf. Consider SLP eval.  11/16- sent SLP eval 6. Skin/Wound Care: Routine skin care checks             -monitor surgical incision 7. Fluids/Electrolytes/Nutrition: Routine Is and Os and follow-up chemistries             -ensure adequate PO hydration 8: Right tib-fib fracture s/p ORIF 11/2 -NWB RLE  -follow-up with Dr. Mardelle Matte 9: Right non-displaced wrist fracture: WBAT tolerated through elbow             -maintain wrist splint             -follow-up with Dr. 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   11/23- Cr up to 2.99 and BUN up to 47 from 32- will start IVFs NS 75cc/hour x 24 hours and recheck in AM- will d/w nursing to push PO fluids at all times.    11/24- Cr 2.21- BUN still 41- will give less IVFs for another 24 hours- and recheck labs in AM-  13: Hyperlipidemia: continue Lipitor 14: DM2: CBGs 4 times daily             -continue Levemir 10 units daily             -continue sliding scale             -consider restarting home insulin 4 units with meals  11/14- will monitor trend- is 91-223 in last 24 hours, but give 1 more day before adding insulin  11/15- Had only 1 spike in last 24 hours-  will monitor  11/17- CBGs looking great- con't regimen  11/18 one elevated CBG of 208, overall well controlled, continue to monitor  11/20- CBG's are great in AM, but spike after meals- will try Tradjenta since not renally dosed- 5 mg daily- will monitor for hypoglycemia. If doesn't work, will restart insulin with meals  11/21- CBG dropped to 47 yesterday- will stop Tradjenta and wait to see how CBGs do before making more changes  11/22- CBGs dropped to 60 again after tradgenta stopped- changed SSI to sensitive  11/23- Doing better off Tradjenta  11/24- CBGs looking much better- con't regimen              11/25 - CBGs last taken 11/22? BMP BG 95; stable  15: Constipation: continue senna and Colace daily; prns  11/23- BM's yesterday and this AM  16: Anemia; multi-factorial: follow-up CBC 17: Leukocytosis/low grade fever: abx for pneumonia continue  11/14- WBC down to 15k from 16.4- on Rocephin  11/15- recheck in AM  11/16- WBC 10.3-  11/20- WBC down to 8.1  18: AKI: in setting of CKD4 serum creatinine improving, still elevated (baseline 2.3-2.6)             -follow-up on BMP  11/14- Cr down to 2.74 from 3.04- so heading in right direction   11/15- recheck in AM  11/16- Cr 2.37- will stop IVFs since at baseline  11/20- Cr up a little to 2.6- will let push fluids and recheck Wednesday  11/21- labs in AM  11/22- Cr 2.84  and BUN 39- will push fluids- if doesn't improve tomorrow, will give some more IVFs. 11/23- will give IVFs for increasing Cr/BUN-  11/24- Cr down to 2.41 and BUN 41- will redo IVFs for 1 more day and recheck in AM- baseline Cr ~ 2.2- will give 50cc/hour and start at 2pm to avoid therapies.  11/25 - Cr  2.40, BUN 42; stable/unchanged with IVF. As above, now within baseline. DC IVF, encourage PO (4-6 250 cc cups), labs Monday.  19. Confusion  11/16- will check U/A and Cx since family says confusion worse/not at baseline- esp since flooding bed multiple times per  day  11/17- U/A not back- will order again just to make sure  -spoke with charge nurse- they can cath her if need be to get specimen.   -11/18 Urine culture with no growth 20. Allergies  11/20- will write for pt's Zyrtec - brought from home. and stop Claritin 21. Depression  11/21- will start Prozac 10 mg daily-   11/23- not drinking- will ask nursing to push fluids    LOS: 12 days A FACE TO [place] 05/31/2022, 3:14 PM

## 2022-05-31 NOTE — Plan of Care (Signed)
  Problem: Consults Goal: RH GENERAL PATIENT EDUCATION Description: See Patient Education module for education specifics. Outcome: Progressing   Problem: RH BOWEL ELIMINATION Goal: RH STG MANAGE BOWEL WITH ASSISTANCE Description: STG Manage Bowel with min Assistance. Outcome: Progressing Goal: RH STG MANAGE BOWEL W/MEDICATION W/ASSISTANCE Description: STG Manage Bowel with Medication with min Assistance. Outcome: Progressing   Problem: RH BLADDER ELIMINATION Goal: RH STG MANAGE BLADDER WITH ASSISTANCE Description: STG Manage Bladder With min Assistance Outcome: Progressing   Problem: RH SKIN INTEGRITY Goal: RH STG SKIN FREE OF INFECTION/BREAKDOWN Description: Skin will be free of any additional infection/breakdown with min assist Outcome: Progressing Goal: RH STG MAINTAIN SKIN INTEGRITY WITH ASSISTANCE Description: STG Maintain Skin Integrity With min Assistance. Outcome: Progressing   Problem: RH SAFETY Goal: RH STG ADHERE TO SAFETY PRECAUTIONS W/ASSISTANCE/DEVICE Description: STG Adhere to Safety Precautions With cueing Assistance/Device. Outcome: Progressing   Problem: RH PAIN MANAGEMENT Goal: RH STG PAIN MANAGED AT OR BELOW PT'S PAIN GOAL Description: Pain will be managed at 4 out of 10 on pain scale with prn medications min assist Outcome: Progressing   Problem: RH KNOWLEDGE DEFICIT GENERAL Goal: RH STG INCREASE KNOWLEDGE OF SELF CARE AFTER HOSPITALIZATION Description: Patient/caregiver will be able to manage medications and self care from nursing education and handouts independently  Outcome: Progressing   Problem: Education: Goal: Ability to describe self-care measures that may prevent or decrease complications (Diabetes Survival Skills Education) will improve Outcome: Progressing Goal: Individualized Educational Video(s) Outcome: Progressing   Problem: Coping: Goal: Ability to adjust to condition or change in health will improve Outcome: Progressing    Problem: Fluid Volume: Goal: Ability to maintain a balanced intake and output will improve Outcome: Progressing   Problem: Health Behavior/Discharge Planning: Goal: Ability to identify and utilize available resources and services will improve Outcome: Progressing Goal: Ability to manage health-related needs will improve Outcome: Progressing   Problem: Metabolic: Goal: Ability to maintain appropriate glucose levels will improve Outcome: Progressing   Problem: Nutritional: Goal: Maintenance of adequate nutrition will improve Outcome: Progressing Goal: Progress toward achieving an optimal weight will improve Outcome: Progressing   Problem: Skin Integrity: Goal: Risk for impaired skin integrity will decrease Outcome: Progressing   Problem: Tissue Perfusion: Goal: Adequacy of tissue perfusion will improve Outcome: Progressing

## 2022-06-01 ENCOUNTER — Inpatient Hospital Stay (HOSPITAL_COMMUNITY): Payer: Medicare Other

## 2022-06-01 NOTE — Progress Notes (Addendum)
Physical Therapy Session Note  Patient Details  Name: Miranda Roman MRN: [id number] Date of Birth: 1946-06-07  Today's Date: 06/01/2022 PT Individual Time:  0902-0945  PT Individual Time Calculation (min): 43 min  Short Term Goals: Week 1:  PT Short Term Goal 1 (Week 1): Pt will perform STS +1 assist PT Short Term Goal 1 - Progress (Week 1): Met PT Short Term Goal 2 (Week 1): Pt will tolerate sitting OOB >1 hour between sessions PT Short Term Goal 2 - Progress (Week 1): Met PT Short Term Goal 3 (Week 1): Pt will perform least restrictive transfer with max A PT Short Term Goal 3 - Progress (Week 1): Met Week 2:  PT Short Term Goal 1 (Week 2): =LTGs d/t ELOS  Skilled Therapeutic Interventions/Progress Updates:  Patient supine in bed on entrance to room. Patient alert and agreeable to PT session. Son present.   Patient with minimal pain complaint to RLE at start of session while at rest.  Therapeutic Activity: Bed Mobility: Pt performed supine --> sit with CGA/ MinA requiring extra time, encouragement and vc/ tc required for technique for turn to sidelying and then pushing up to sit on EOB. At end of session pt requires MaxA +2 for return to supine, then ModA +2 with use of bed features and pt's LLE to position higher in bed toward HOB. Relates need to move bowels. ModA +2 to remove pants and for bed pan placement.  Transfers: Pt performed sit<>stand transfers to [place] x3 with ModA +2. Bed slightly elevated. Provided verbal cues for technique in decreased WB to RLE and to complete rise to upright posture each transfer. Pt was able to tolerate 3 standing bouts at ~1 min, then ~2 min , and ~1 min for final bout. On second and third standing bouts, pt noted to decrease WB to RLE while positioned on therapist's foot with one short instance of full NWB and lift. Encouraged for x5 SLR to pt's minimal range for full lift from therapist's foot.    Therapeutic Exercise: Pt performed the following  RLE AAROM seated exercises with vc/ tc for proper technique. Christia Reading - marches - hip abd/ add - HS curls  Patient supine in bed at end of session with brakes locked, bed alarm set, and all needs within reach. Pt assisted to bed pan for bowel movement. Required ModA +2 for bed mobility and bed pan placement. NT notified.    Therapy Documentation Precautions:  Precautions Precautions: Fall Precaution Comments: R knee pain - hold leg up Required Braces or Orthoses: Splint/Cast Splint/Cast: RUE and RLE Restrictions Weight Bearing Restrictions: Yes RUE Weight Bearing: Weight bearing as tolerated RLE Weight Bearing: Non weight bearing Other Position/Activity Restrictions: Can bear weight through right elbow, not wrist General:   Vital Signs:  Pain:  Increased pain related when assisted in repositioning RLE in bed. Quickly decreases with rest.  Therapy/Group: Individual Therapy  Alger Simons PT, DPT, CSRS 05/31/2022, 11:52 AM

## 2022-06-01 NOTE — Plan of Care (Signed)
Problem: Consults Goal: RH GENERAL PATIENT EDUCATION Description: See Patient Education module for education specifics. 06/01/2022 1138 by Sanda Linger, RN Outcome: Progressing 06/01/2022 1111 by Sanda Linger, RN Outcome: Progressing   Problem: RH BOWEL ELIMINATION Goal: RH STG MANAGE BOWEL WITH ASSISTANCE Description: STG Manage Bowel with min Assistance. 06/01/2022 1138 by Sanda Linger, RN Outcome: Not Progressing Note: Patient continues to be maxiimove for staff transfer. Patient is total assist with bowel  management. 06/01/2022 1111 by Sanda Linger, RN Outcome: Progressing Goal: RH STG MANAGE BOWEL W/MEDICATION W/ASSISTANCE Description: STG Manage Bowel with Medication with min Assistance. 06/01/2022 1138 by Sanda Linger, RN Outcome: Progressing 06/01/2022 1111 by Sanda Linger, RN Outcome: Progressing   Problem: RH BLADDER ELIMINATION Goal: RH STG MANAGE BLADDER WITH ASSISTANCE Description: STG Manage Bladder With min Assistance 06/01/2022 1138 by Sanda Linger, RN Outcome: Not Progressing Note: Patient is offered bedpan. Patient wears pure wick during the day and fills 1-3 canisters. Patient is currently a total assist. 06/01/2022 1111 by Sanda Linger, RN Outcome: Progressing   Problem: RH SKIN INTEGRITY Goal: RH STG SKIN FREE OF INFECTION/BREAKDOWN Description: Skin will be free of any additional infection/breakdown with min assist 06/01/2022 1138 by Sanda Linger, RN Outcome: Progressing 06/01/2022 1111 by Sanda Linger, RN Outcome: Progressing Goal: RH STG MAINTAIN SKIN INTEGRITY WITH ASSISTANCE Description: STG Maintain Skin Integrity With min Assistance. 06/01/2022 1138 by Sanda Linger, RN Outcome: Progressing 06/01/2022 1111 by Sanda Linger, RN Outcome: Progressing   Problem: RH SAFETY Goal: RH STG ADHERE TO SAFETY PRECAUTIONS W/ASSISTANCE/DEVICE Description: STG Adhere to Safety Precautions With cueing Assistance/Device. 06/01/2022  1138 by Sanda Linger, RN Outcome: Progressing 06/01/2022 1111 by Sanda Linger, RN Outcome: Progressing   Problem: RH PAIN MANAGEMENT Goal: RH STG PAIN MANAGED AT OR BELOW PT'S PAIN GOAL Description: Pain will be managed at 4 out of 10 on pain scale with prn medications min assist 06/01/2022 1138 by Sanda Linger, RN Outcome: Progressing 06/01/2022 1111 by Sanda Linger, RN Outcome: Progressing   Problem: RH KNOWLEDGE DEFICIT GENERAL Goal: RH STG INCREASE KNOWLEDGE OF SELF CARE AFTER HOSPITALIZATION Description: Patient/caregiver will be able to manage medications and self care from nursing education and handouts independently  06/01/2022 1138 by Sanda Linger, RN Outcome: Progressing 06/01/2022 1111 by Sanda Linger, RN Outcome: Progressing   Problem: Education: Goal: Ability to describe self-care measures that may prevent or decrease complications (Diabetes Survival Skills Education) will improve 06/01/2022 1138 by Sanda Linger, RN Outcome: Progressing 06/01/2022 1111 by Sanda Linger, RN Outcome: Progressing Goal: Individualized Educational Video(s) 06/01/2022 1138 by Sanda Linger, RN Outcome: Progressing 06/01/2022 1111 by Sanda Linger, RN Outcome: Progressing   Problem: Coping: Goal: Ability to adjust to condition or change in health will improve 06/01/2022 1138 by Sanda Linger, RN Outcome: Progressing 06/01/2022 1111 by Sanda Linger, RN Outcome: Progressing   Problem: Fluid Volume: Goal: Ability to maintain a balanced intake and output will improve 06/01/2022 1138 by Sanda Linger, RN Outcome: Progressing 06/01/2022 1111 by Sanda Linger, RN Outcome: Progressing   Problem: Health Behavior/Discharge Planning: Goal: Ability to identify and utilize available resources and services will improve 06/01/2022 1138 by Sanda Linger, RN Outcome: Progressing 06/01/2022 1111 by Sanda Linger, RN Outcome: Progressing Goal: Ability to manage  health-related needs will improve 06/01/2022 1138 by Sanda Linger, RN Outcome: Progressing 06/01/2022 1111 by Sanda Linger, RN Outcome:  Progressing   Problem: Metabolic: Goal: Ability to maintain appropriate glucose levels will improve 06/01/2022 1138 by Sanda Linger, RN Outcome: Progressing 06/01/2022 1111 by Sanda Linger, RN Outcome: Progressing   Problem: Nutritional: Goal: Maintenance of adequate nutrition will improve 06/01/2022 1138 by Sanda Linger, RN Outcome: Progressing 06/01/2022 1111 by Sanda Linger, RN Outcome: Progressing Goal: Progress toward achieving an optimal weight will improve 06/01/2022 1138 by Sanda Linger, RN Outcome: Progressing 06/01/2022 1111 by Sanda Linger, RN Outcome: Progressing   Problem: Skin Integrity: Goal: Risk for impaired skin integrity will decrease 06/01/2022 1138 by Sanda Linger, RN Outcome: Not Progressing Note: Patient family members have stated that she has seen a podiatrist in the past and could benefit from a podiatry consult or follow up with her podiatrist. 06/01/2022 1111 by Sanda Linger, RN Outcome: Progressing   Problem: Tissue Perfusion: Goal: Adequacy of tissue perfusion will improve 06/01/2022 1138 by Sanda Linger, RN Outcome: Progressing 06/01/2022 1111 by Sanda Linger, RN Outcome: Progressing

## 2022-06-01 NOTE — Progress Notes (Signed)
Patient complained of pain on left side of chest that felt like the last time she had pneumonia. Nurse called Dr to get order for chest xray. Xray performed, nurse will continue to monitor patient.

## 2022-06-02 LAB — GLUCOSE, CAPILLARY
Glucose-Capillary: 142 mg/dL — ABNORMAL HIGH (ref 70–99)
Glucose-Capillary: 149 mg/dL — ABNORMAL HIGH (ref 70–99)
Glucose-Capillary: 105 mg/dL — ABNORMAL HIGH (ref 70–99)
Glucose-Capillary: 108 mg/dL — ABNORMAL HIGH (ref 70–99)
Glucose-Capillary: 200 mg/dL — ABNORMAL HIGH (ref 70–99)
Glucose-Capillary: 130 mg/dL — ABNORMAL HIGH (ref 70–99)
Glucose-Capillary: 100 mg/dL — ABNORMAL HIGH (ref 70–99)
Glucose-Capillary: 127 mg/dL — ABNORMAL HIGH (ref 70–99)
Glucose-Capillary: 130 mg/dL — ABNORMAL HIGH (ref 70–99)
Glucose-Capillary: 124 mg/dL — ABNORMAL HIGH (ref 70–99)
Glucose-Capillary: 202 mg/dL — ABNORMAL HIGH (ref 70–99)
Glucose-Capillary: 91 mg/dL (ref 70–99)
Glucose-Capillary: 145 mg/dL — ABNORMAL HIGH (ref 70–99)
Glucose-Capillary: 132 mg/dL — ABNORMAL HIGH (ref 70–99)
Glucose-Capillary: 113 mg/dL — ABNORMAL HIGH (ref 70–99)
Glucose-Capillary: 99 mg/dL (ref 70–99)
Glucose-Capillary: 124 mg/dL — ABNORMAL HIGH (ref 70–99)
Glucose-Capillary: 108 mg/dL — ABNORMAL HIGH (ref 70–99)
Glucose-Capillary: 195 mg/dL — ABNORMAL HIGH (ref 70–99)
Glucose-Capillary: 108 mg/dL — ABNORMAL HIGH (ref 70–99)
Glucose-Capillary: 200 mg/dL — ABNORMAL HIGH (ref 70–99)

## 2022-06-02 LAB — BASIC METABOLIC PANEL
Anion gap: 11 (ref 5–15)
BUN: 44 mg/dL — ABNORMAL HIGH (ref 8–23)
CO2: 21 mmol/L — ABNORMAL LOW (ref 22–32)
Calcium: 8.5 mg/dL — ABNORMAL LOW (ref 8.9–10.3)
Chloride: 107 mmol/L (ref 98–111)
Creatinine, Ser: 2.68 mg/dL — ABNORMAL HIGH (ref 0.44–1.00)
GFR, Estimated: 18 mL/min — ABNORMAL LOW (ref >60)
Glucose, Bld: 112 mg/dL — ABNORMAL HIGH (ref 70–99)
Potassium: 4.1 mmol/L (ref 3.5–5.1)
Sodium: 139 mmol/L (ref 135–145)

## 2022-06-02 LAB — CBC
HCT: 29.1 % — ABNORMAL LOW (ref 36.0–46.0)
Hemoglobin: 9.4 g/dL — ABNORMAL LOW (ref 12.0–15.0)
MCH: 25.1 pg — ABNORMAL LOW (ref 26.0–34.0)
MCHC: 32.3 g/dL (ref 30.0–36.0)
MCV: 77.8 fL — ABNORMAL LOW (ref 80.0–100.0)
Platelets: 439 10*3/uL — ABNORMAL HIGH (ref 150–400)
RBC: 3.74 MIL/uL — ABNORMAL LOW (ref 3.87–5.11)
RDW: 18.6 % — ABNORMAL HIGH (ref 11.5–15.5)
WBC: 11.9 10*3/uL — ABNORMAL HIGH (ref 4.0–10.5)
nRBC: 0 % (ref 0.0–0.2)

## 2022-06-02 MED ORDER — SIMETHICONE 80 MG PO CHEW
80.0000 mg | CHEWABLE_TABLET | Freq: Four times a day (QID) | ORAL | Status: DC
  Administered 2022-06-02 – 2022-06-06 (×16): 80 mg via ORAL
  Filled 2022-06-02 (×17): qty 1

## 2022-06-02 MED ORDER — SODIUM CHLORIDE 0.9 % IV SOLN: INTRAVENOUS | Status: AC

## 2022-06-02 MED ORDER — SORBITOL 70 % SOLN
30.0000 mL | Freq: Once | Status: AC
  Administered 2022-06-02: 30 mL via ORAL
  Filled 2022-06-02: qty 30

## 2022-06-02 NOTE — Progress Notes (Signed)
Physical Therapy Session Note  Patient Details  Name: Miranda Roman MRN: [id number] Date of Birth: August 24, 1945  Today's Date: 06/02/2022 PT Individual Time: 1000-1115 PT Individual Time Calculation (min): 75 min   Short Term Goals: Week 1:  PT Short Term Goal 1 (Week 1): Pt will perform STS +1 assist PT Short Term Goal 1 - Progress (Week 1): Met PT Short Term Goal 2 (Week 1): Pt will tolerate sitting OOB >1 hour between sessions PT Short Term Goal 2 - Progress (Week 1): Met PT Short Term Goal 3 (Week 1): Pt will perform least restrictive transfer with max A PT Short Term Goal 3 - Progress (Week 1): Met Week 2:  PT Short Term Goal 1 (Week 2): =LTGs d/t ELOS  Skilled Therapeutic Interventions/Progress Updates:    Pt seated in w/c on arrival and agreeable to therapy. No complaint of pain at rest, some with mobility, especially knee flexion, premedicated. Rest and positioning provided as needed. Session focused on family education with pt's son, Miranda Roman. Pt transported to therapy gym for time management and energy conservation. Pt performed car transfer x 2 with therapist and Miranda Roman providing max-tot A. Pt requires assist for placing board and all parts of transfer. Therapist then instructed Miranda Roman to set up and assist with transfer back to bed. Therapist provided cues for technique and body mechanics throughout. Pt then required max A for sit>supine d/t fatigue and poor initiation. Pt then instructed on and performed HEP, provided written hand out as follows for performance between therapy sessions.  Pt performed the following exercises to promote LE strength and endurance, active assist for all RLE activity:  SLR x 10 Ankle pumps x 2 (LLE only) Heel slides x 10 Supine abduction x 10 Pt remained in bed and was left with all needs in reach and alarm active.   Therapy Documentation Precautions:  Precautions Precautions: Fall Precaution Comments: R knee pain - hold leg up Required Braces or  Orthoses: Splint/Cast Splint/Cast: RUE and RLE Restrictions Weight Bearing Restrictions: Yes RUE Weight Bearing: Weight bearing as tolerated RLE Weight Bearing: Non weight bearing Other Position/Activity Restrictions: Can bear weight through right elbow, not wrist General:       Therapy/Group: Individual Therapy  Mickel Fuchs 06/02/2022, 3:42 PM

## 2022-06-02 NOTE — Progress Notes (Signed)
Occupational Therapy Session Note  Patient Details  Name: Miranda Roman MRN: [id number] Date of Birth: [birth year]  Today's Date: 06/02/2022 OT Individual Time: 2297-9892 OT Individual Time Calculation (min): 75 min    Short Term Goals: Week 2:  OT Short Term Goal 1 (Week 2): Pt will complete LB bathing task with Max A at bed level. OT Short Term Goal 2 (Week 2): Pt will complete LB bathing task with Max A at bed level.  Skilled Therapeutic Interventions/Progress Updates:    Pt eating breakfast in bed upon arrival. Son present for education. Pt incontinent of bowel. Dependent for hygiene and clothing mgmt. OT intervention with focus on BADLs at bed level and EOB, bed mobility, sitting balance, and SB transfers. Pt's son observed and assisted with bed mobility, LB dressing at bed level, and UB dressing seated EOB. Pt assited with SB transfer to w/c. Step by step instructions related to son. Recommended SB tranfsers with +2 for safety. DME recommendations: hospital bed for positioning and bed mobility. Pt requires HOB slightly elevated and use of bed rails for rolling in bed and supine>sit EOB. Pt will also need a DABSC. Pt remained in w/c with belt alarm activated and all needs within reach. Son present.   Therapy Documentation Precautions:  Precautions Precautions: Fall Precaution Comments: R knee pain - hold leg up Required Braces or Orthoses: Splint/Cast Splint/Cast: RUE and RLE Restrictions Weight Bearing Restrictions: Yes RUE Weight Bearing: Weight bearing as tolerated RLE Weight Bearing: Non weight bearing Other Position/Activity Restrictions: Can bear weight through right elbow, not wrist   Pain:     Therapy/Group: Individual Therapy  [name] 06/02/2022, 12:12 PM

## 2022-06-02 NOTE — Progress Notes (Signed)
Occupational Therapy Session Note  Patient Details  Name: Miranda Roman MRN: [id number] Date of Birth: [birth year]  Today's Date: 06/02/2022 OT Individual Time: 1300-1355 OT Individual Time Calculation (min): 55 min    Short Term Goals: Week 2:  OT Short Term Goal 1 (Week 2): Pt will complete LB bathing task with Max A at bed level. OT Short Term Goal 2 (Week 2): Pt will complete LB bathing task with Max A at bed level.  Skilled Therapeutic Interventions/Progress Updates:    Pt resting in bed upon arrival. Pt incontinent of bladder (pt thought she was a little "wet" but had not informed anyone. Dependent for toileting at bed level. Supine>sit EOB with mod A. SB transfer to w/c with tot A+1. Transition to gym> LUE threrex with 1# cuff on wrist while tapping beach ball; 4x12. Chest presses with 2# bar bell-4/10. Biceps curls with 3# bar bell-4x12. Pt returned to room and remained in w/c with belt alarm activated. All needs within reach and care giver present.  Therapy Documentation Precautions:  Precautions Precautions: Fall Precaution Comments: R knee pain - hold leg up Required Braces or Orthoses: Splint/Cast Splint/Cast: RUE and RLE Restrictions Weight Bearing Restrictions: Yes RUE Weight Bearing: Weight bearing as tolerated RLE Weight Bearing: Non weight bearing Other Position/Activity Restrictions: Can bear weight through right elbow, not wrist   Pain:  Pt denies pain at rest; RLE pain with transitional movements   Therapy/Group: Individual Therapy  [name] 06/02/2022, 2:46 PM

## 2022-06-02 NOTE — Progress Notes (Signed)
PROGRESS NOTE   Subjective/Complaints:  Pt reports having pain in L chest- base don her description, is a crampy pain that comes and goes- she thinks it's due to pneumonia, however CXR (-) from last night- It appears to be more LUQ pain- and she keeps rubbing her abdomen.  Pains appear to startle her"coming out of nowhere".    ROS:  Pt denies SOB,  (+) abd pain, CP, N/V/C/D, and vision changes   Objective:   DG CHEST PORT 1 VIEW  Result Date: 06/01/2022 CLINICAL DATA:  Pain EXAM: PORTABLE CHEST 1 VIEW COMPARISON:  05/15/2022 FINDINGS: The heart size and mediastinal contours are within normal limits. Trace bilateral pleural effusions and or pleural thickening. No acute appearing airspace opacity. The visualized skeletal structures are unremarkable. IMPRESSION: Trace bilateral pleural effusions and or pleural thickening. No acute appearing airspace opacity. Electronically Signed   By: Delanna Ahmadi M.D.   On: 06/01/2022 20:34   Recent Labs    05/31/22 2051 06/02/22 0546  WBC 10.3 11.9*  HGB 8.7* 9.4*  HCT 26.4* 29.1*  PLT 449* 439*    Recent Labs    05/31/22 2051 06/02/22 0546  NA 138 139  K 4.6 4.1  CL 108 107  CO2 21* 21*  GLUCOSE 139* 112*  BUN 49* 44*  CREATININE 2.48* 2.68*  CALCIUM 8.5* 8.5*    Intake/Output Summary (Last 24 hours) at 06/02/2022 0834 Last data filed at 06/01/2022 1827 Gross per 24 hour  Intake 760 ml  Output --  Net 760 ml        Physical Exam: Vital Signs Blood pressure 106/68, pulse 89, temperature 98.8 F (37.1 C), temperature source Oral, resp. rate 16, height _0  (1.626 m), weight 78.9 kg, SpO2 97 %.    General: awake, alert, appropriate, supine in bed; NAD HENT: conjugate gaze; oropharynx moist CV: regular rate; no JVD Pulmonary: CTA B/L; no W/R/R- good air movement GI: soft, NT, mildly TPT LUQ- no rebound; hypoactive BS Psychiatric: appropriate-  Neurological:  alert, but impaired cognition- chronic MSK: RLE in dressing, wiggles toes, good capillary refill. LLE with mild erythema; no edema, or warmth; so signs ingrown toenail.    PE from prior encounter:    General: awake, alert, appropriate, sad in hospital, but otherwise, appropriate,  NAD HENT: conjugate gaze; oropharynx moist CV: regular rate; no JVD Pulmonary: CTA B/L; no W/R/R- good air movement GI: soft, NT, ND, (+)BS Psychiatric: appropriate- flat Neurological: slightly delayed- chronic Extremities:Tenting improved in hands Musculoskeletal:     Cervical back: Normal range of motion.     Comments: Right lower leg surgical dressing in place; right forearm splint in place with ACE wrap. RUE tender to minimal palpation and movement.  Can wiggle toes on RLE- no change Skin:    General: Skin is warm and dry Neurological:     Mental Status: She is alert and oriented to person, place, and time.     Comments: Underlying cognitive impairment. Oriented to person, place, reason she's here. Fair insight and awareness. Normal speech, language. CN exam unremarkable. LUE 4/5. RUE limited by pain/ortho. Does move all muscle groups. LLE 2/5 prox to 4/5 distal. RLE tr-1/5 prox  to 3/5 distal (can wiggle toes within splint). No focal sensory loss.     Assessment/Plan: 1. Functional deficits which require 3+ hours per day of interdisciplinary therapy in a comprehensive inpatient rehab setting. Physiatrist is providing close team supervision and 24 hour management of active medical problems listed below. Physiatrist and rehab team continue to assess barriers to discharge/monitor patient progress toward functional and medical goals  Care Tool:  Bathing    Body parts bathed by patient: Right arm, Left arm, Chest, Abdomen, Front perineal area, Right upper leg, Left upper leg, Face   Body parts bathed by helper: Buttocks, Left arm Body parts n/a: Right lower leg   Bathing assist Assist Level: Moderate  Assistance - Patient 50 - 74%     Upper Body Dressing/Undressing Upper body dressing   What is the patient wearing?: Pull over shirt    Upper body assist Assist Level: Minimal Assistance - Patient > 75%    Lower Body Dressing/Undressing Lower body dressing      What is the patient wearing?: Incontinence brief, Pants     Lower body assist Assist for lower body dressing: Total Assistance - Patient < 25%     Toileting Toileting    Toileting assist Assist for toileting: 2 Helpers     Transfers Chair/bed transfer  Transfers assist  Chair/bed transfer activity did not occur: Safety/medical concerns  Chair/bed transfer assist level: 2 Helpers (slide board)     Locomotion Ambulation   Ambulation assist   Ambulation activity did not occur: Safety/medical concerns          Walk 10 feet activity   Assist  Walk 10 feet activity did not occur: Safety/medical concerns        Walk 50 feet activity   Assist Walk 50 feet with 2 turns activity did not occur: Safety/medical concerns         Walk 150 feet activity   Assist Walk 150 feet activity did not occur: Safety/medical concerns         Walk 10 feet on uneven surface  activity   Assist Walk 10 feet on uneven surfaces activity did not occur: Safety/medical concerns         Wheelchair     Assist Is the patient using a wheelchair?: Yes Type of Wheelchair: Manual Wheelchair activity did not occur: Safety/medical concerns  Wheelchair assist level: Moderate Assistance - Patient 50 - 74% Max wheelchair distance: 38f    Wheelchair 50 feet with 2 turns activity    Assist        Assist Level: Dependent - Patient 0%   Wheelchair 150 feet activity     Assist      Assist Level: Dependent - Patient 0%    Medical Problem List and Plan: 1. Functional deficits secondary to multiple trauma after ground level fall             -patient may not yet shower             -ELOS/Goals:  12-14 days, min assist to supervision goals  D/c 12/1  Con't CIR- PT and OT 2.  Antithrombotics: -DVT/anticoagulation:  Pharmaceutical: Lovenox 30 mg daily due to renal impairment -venous dopplers appear normal (where able to test given RLE splint)             -antiplatelet therapy: none 3. Pain Management: Tylenol, tramadol, Robaxin prn  11/14- will add Norco 5/325 mg q6 hours prn for pain and monitor cognition- cannot increase tramadol due to  renal impairment- will also add Lidoderm patches 3 patches 12 hrs on; 12 hrs off- 8pm-8am  11/15- slept better due to better pain control- con't regimen per nursing, didn't get loopy with meds  11/16- pain well controlled per pt  11/20- due to renal issues, stopped Ultracet TID- can only receive 2 doses/day  11/25 - L great toe pain with mild redness; on chart review Hx gout; will get AM CBC, uric acid; added voltaren to toe  4. Mood/Behavior/Sleep: LCSW to evaluate and provide emotional support  11/21- will add Prozac 10 mg daily             -antipsychotic agents: n/a 5. Neuropsych/cognition: This patient is not capable of making decisions on her own behalf. Consider SLP eval.  11/16- sent SLP eval 6. Skin/Wound Care: Routine skin care checks             -monitor surgical incision 7. Fluids/Electrolytes/Nutrition: Routine Is and Os and follow-up chemistries             -ensure adequate PO hydration 8: Right tib-fib fracture s/p ORIF 11/2 -NWB RLE  -follow-up with Dr. Mardelle Matte 9: Right non-displaced wrist fracture: WBAT tolerated through elbow             -maintain wrist splint             -follow-up with Dr. 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   11/23- Cr up to 2.99 and BUN up to 47 from 32- will start IVFs NS 75cc/hour x 24 hours and recheck in AM- will d/w nursing to push PO fluids at all times.    11/24- Cr 2.21- BUN still 41- will give less IVFs for another 24 hours- and recheck labs in AM-  13: Hyperlipidemia: continue Lipitor 14: DM2: CBGs 4 times daily             -continue Levemir 10 units daily             -continue sliding scale             -consider restarting home insulin 4 units with meals  11/14- will monitor trend- is 91-223 in last 24 hours, but give 1 more day before adding insulin  11/27- CBG's looking good- con't regimen 15: Constipation: continue senna and Colace daily; prns  11/23- BM's yesterday and this AM  11/27- is constipated and having gas pains/abd pain- will give sorbitol and Gas-x- did have 2 BM's yesterday, but pain is looks like gas pains and she describes as CRAMPING 16: Anemia; multi-factorial: follow-up CBC 17: Leukocytosis/low grade fever: abx for pneumonia continue  11/14- WBC down to 15k from 16.4- on Rocephin  11/15- recheck in AM  11/16- WBC 10.3-  11/20- WBC down to 8.1  11/27- pt says she feels like she "has pneumonia again", but CXR (-) for acute process 18: AKI: in setting of CKD4 serum creatinine improving, still elevated (baseline 2.3-2.6)             -follow-up on BMP  11/14- Cr down to 2.74 from 3.04- so heading in right direction   11/15- recheck in AM  11/16-  Cr 2.37- will stop IVFs since at baseline  11/20- Cr up a little to 2.6- will let push fluids and recheck Wednesday  11/21- labs in AM  11/22- Cr 2.84 and BUN 39- will push fluids- if doesn't improve tomorrow, will give some more IVFs. 11/23- will give IVFs for increasing Cr/BUN-  11/24- Cr down to 2.41 and BUN 41- will redo IVFs for 1 more day and recheck in AM- baseline Cr ~ 2.2- will give 50cc/hour and start at 2pm to avoid therapies.  11/25 - Cr  2.40, BUN 42; stable/unchanged with IVF. As  above, now within baseline. DC IVF, encourage PO (4-6 250 cc cups), labs Monday. 11/27- Cr 2.68- trending up again- will give more IVFs- have asked nursing to give fluids- push fluids.  19. Confusion  11/16- will check U/A and Cx since family says confusion worse/not at baseline- esp since flooding bed multiple times per day  11/17- U/A not back- will order again just to make sure  -spoke with charge nurse- they can cath her if need be to get specimen.   -11/18 Urine culture with no growth 20. Allergies  11/20- will write for pt's Zyrtec - brought from home. and stop Claritin 21. Depression  11/21- will start Prozac 10 mg daily-   11/23- not drinking- will ask nursing to push fluids 22. Leukocytosis  11/27- Mild- WBC 11.9- Tm 99.8-CXR looks good except trace pleural effusions, but no issues- Pt c/o L chest pain, but based on palpation, is actually LUQ pain, NOT chest pain- will order sorbitol and get cleaned out -will check labs again in AM   I spent a total of 39   minutes on total care today- >50% coordination of care- due to reviewing char tin depth and speaking to nursing.     LOS: 14 days A FACE TO FACE EVALUATION WAS PERFORMED  Romel Dumond 06/02/2022, 8:34 AM

## 2022-06-02 NOTE — Plan of Care (Signed)
Downgraded goals d/t slow progress.

## 2022-06-03 DIAGNOSIS — R350 Frequency of micturition: Secondary | ICD-10-CM

## 2022-06-03 DIAGNOSIS — D72823 Leukemoid reaction: Secondary | ICD-10-CM

## 2022-06-03 DIAGNOSIS — N189 Chronic kidney disease, unspecified: Secondary | ICD-10-CM

## 2022-06-03 LAB — CBC WITH DIFFERENTIAL/PLATELET
Abs Immature Granulocytes: 0.07 10*3/uL (ref 0.00–0.07)
Basophils Absolute: 0.1 10*3/uL (ref 0.0–0.1)
Basophils Relative: 1 %
Eosinophils Absolute: 0.1 10*3/uL (ref 0.0–0.5)
Eosinophils Relative: 1 %
HCT: 27.9 % — ABNORMAL LOW (ref 36.0–46.0)
Hemoglobin: 8.8 g/dL — ABNORMAL LOW (ref 12.0–15.0)
Immature Granulocytes: 1 %
Lymphocytes Relative: 14 %
Lymphs Abs: 2.1 10*3/uL (ref 0.7–4.0)
MCH: 24.7 pg — ABNORMAL LOW (ref 26.0–34.0)
MCHC: 31.5 g/dL (ref 30.0–36.0)
MCV: 78.4 fL — ABNORMAL LOW (ref 80.0–100.0)
Monocytes Absolute: 1.2 10*3/uL — ABNORMAL HIGH (ref 0.1–1.0)
Monocytes Relative: 8 %
Neutro Abs: 10.9 10*3/uL — ABNORMAL HIGH (ref 1.7–7.7)
Neutrophils Relative %: 75 %
Platelets: 408 10*3/uL — ABNORMAL HIGH (ref 150–400)
RBC: 3.56 MIL/uL — ABNORMAL LOW (ref 3.87–5.11)
RDW: 18.4 % — ABNORMAL HIGH (ref 11.5–15.5)
WBC: 14.5 10*3/uL — ABNORMAL HIGH (ref 4.0–10.5)
nRBC: 0 % (ref 0.0–0.2)

## 2022-06-03 LAB — BASIC METABOLIC PANEL
Anion gap: 12 (ref 5–15)
BUN: 47 mg/dL — ABNORMAL HIGH (ref 8–23)
CO2: 18 mmol/L — ABNORMAL LOW (ref 22–32)
Calcium: 8.6 mg/dL — ABNORMAL LOW (ref 8.9–10.3)
Chloride: 111 mmol/L (ref 98–111)
Creatinine, Ser: 2.8 mg/dL — ABNORMAL HIGH (ref 0.44–1.00)
GFR, Estimated: 17 mL/min — ABNORMAL LOW (ref >60)
Glucose, Bld: 113 mg/dL — ABNORMAL HIGH (ref 70–99)
Potassium: 5 mmol/L (ref 3.5–5.1)
Sodium: 141 mmol/L (ref 135–145)

## 2022-06-03 LAB — URINALYSIS, ROUTINE W REFLEX MICROSCOPIC
Bilirubin Urine: NEGATIVE
Glucose, UA: NEGATIVE mg/dL
Ketones, ur: NEGATIVE mg/dL
Nitrite: NEGATIVE
Protein, ur: 100 mg/dL — AB
Specific Gravity, Urine: 1.008 (ref 1.005–1.030)
WBC, UA: >50 WBC/hpf — ABNORMAL HIGH (ref 0–5)
pH: 6 (ref 5.0–8.0)

## 2022-06-03 LAB — GLUCOSE, CAPILLARY
Glucose-Capillary: 234 mg/dL — ABNORMAL HIGH (ref 70–99)
Glucose-Capillary: 244 mg/dL — ABNORMAL HIGH (ref 70–99)
Glucose-Capillary: 119 mg/dL — ABNORMAL HIGH (ref 70–99)
Glucose-Capillary: 143 mg/dL — ABNORMAL HIGH (ref 70–99)

## 2022-06-03 MED ORDER — SENNOSIDES-DOCUSATE SODIUM 8.6-50 MG PO TABS
2.0000 | ORAL_TABLET | Freq: Every day | ORAL | Status: DC
  Administered 2022-06-03 – 2022-06-05 (×3): 2 via ORAL
  Filled 2022-06-03 (×3): qty 2

## 2022-06-03 NOTE — Progress Notes (Addendum)
Patient ID: REXANNA LOUTHAN, female   DOB: 01-16-1946, 76 y.o.   MRN: [id number]  SW ordered semi-electric hospital bed with [facility] via parachute.   [id number] spoke with pt dtr [name] to provide updates from team conference, confirm family edu for tomorrow, hospital bed ordered, and confirmed if DABSC is needed. Confirms family edu hospital bed should be delivered tomorrow, and will need another DABSC. She will follow-up with SW about [facility] preference.   SW ordered [facility] with [place] [facility].   *SW received message from pt dtr requesting [facility], and  if specialty cushion could be purchased and if a slide board is needed at d/c. SW updated medical team to see if speciality cushion would be covered under insurance as need to have an stagable wound 3/4, and if slide board is needed.   SW sent HHPT/OT/?SN/aide referral to [facility] and waiting on follow-up.   [name], MSW, [place] Office: [phone] Cell: [phone] Fax: [phone]

## 2022-06-03 NOTE — Progress Notes (Signed)
PROGRESS NOTE   Subjective/Complaints:  Some pain in left great toe. Still incontinent. Anxious to get home.   ROS: Patient denies fever, rash, sore throat, blurred vision, dizziness, nausea, vomiting, diarrhea, cough, shortness of breath or chest pain,  headache, or mood change.    Objective:   DG CHEST PORT 1 VIEW  Result Date: 06/01/2022 CLINICAL DATA:  Pain EXAM: PORTABLE CHEST 1 VIEW COMPARISON:  05/15/2022 FINDINGS: The heart size and mediastinal contours are within normal limits. Trace bilateral pleural effusions and or pleural thickening. No acute appearing airspace opacity. The visualized skeletal structures are unremarkable. IMPRESSION: Trace bilateral pleural effusions and or pleural thickening. No acute appearing airspace opacity. Electronically Signed   By: Delanna Ahmadi M.D.   On: 06/01/2022 20:34   Recent Labs    06/02/22 0546 06/03/22 1054  WBC 11.9* 14.5*  HGB 9.4* 8.8*  HCT 29.1* 27.9*  PLT 439* 408*    Recent Labs    06/02/22 0546 06/03/22 0753  NA 139 141  K 4.1 5.0  CL 107 111  CO2 21* 18*  GLUCOSE 112* 113*  BUN 44* 47*  CREATININE 2.68* 2.80*  CALCIUM 8.5* 8.6*    Intake/Output Summary (Last 24 hours) at 06/03/2022 1407 Last data filed at 06/02/2022 1837 Gross per 24 hour  Intake 236 ml  Output --  Net 236 ml        Physical Exam: Vital Signs Blood pressure (!) 145/64, pulse 88, temperature 99.8 F (37.7 C), temperature source Oral, resp. rate 18, height _0  (1.626 m), weight 78.9 kg, SpO2 95 %.    Constitutional: No distress . Vital signs reviewed. HEENT: NCAT, EOMI, oral membranes moist Neck: supple Cardiovascular: RRR without murmur. No JVD    Respiratory/Chest: CTA Bilaterally without wheezes or rales. Normal effort    GI/Abdomen: BS +, non-tender, non-distended Ext: no clubbing, cyanosis, or edema Psych: pleasant and cooperative  Neurological: alert, but impaired  cognition- chronic   Musculoskeletal:     Cervical back: Normal range of motion.     Comments: Right lower leg surgical dressing in place; right forearm splint in place with ACE wrap. RUE tender to minimal palpation and movement.  Can wiggle toes on RLE- no change Skin:    General: Skin is warm and dry Neurological:     Mental Status: She is alert and oriented to person, place, and time.     Comments: Underlying cognitive impairment. Oriented to person, place, reason she's here. Fair insight and awareness. Normal speech, language. CN exam unremarkable. LUE 4/5. RUE limited by pain/ortho. Does move all muscle groups. LLE 2/5 prox to 4/5 distal. RLE tr-1/5 prox to 3/5 distal (wiggles toes within splint). No focal sensory loss.     Assessment/Plan: 1. Functional deficits which require 3+ hours per day of interdisciplinary therapy in a comprehensive inpatient rehab setting. Physiatrist is providing close team supervision and 24 hour management of active medical problems listed below. Physiatrist and rehab team continue to assess barriers to discharge/monitor patient progress toward functional and medical goals  Care Tool:  Bathing    Body parts bathed by patient: Right arm, Left arm, Chest, Abdomen, Front perineal area, Right upper  leg, Left upper leg, Face   Body parts bathed by helper: Buttocks, Left lower leg Body parts n/a: Right lower leg   Bathing assist Assist Level: Moderate Assistance - Patient 50 - 74%     Upper Body Dressing/Undressing Upper body dressing   What is the patient wearing?: Pull over shirt    Upper body assist Assist Level: Minimal Assistance - Patient > 75%    Lower Body Dressing/Undressing Lower body dressing      What is the patient wearing?: Incontinence brief, Pants     Lower body assist Assist for lower body dressing: Total Assistance - Patient < 25%     Toileting Toileting    Toileting assist Assist for toileting: Total Assistance - Patient <  25%     Transfers Chair/bed transfer  Transfers assist  Chair/bed transfer activity did not occur: Safety/medical concerns  Chair/bed transfer assist level: Total Assistance - Patient < 25%     Locomotion Ambulation   Ambulation assist   Ambulation activity did not occur: Safety/medical concerns          Walk 10 feet activity   Assist  Walk 10 feet activity did not occur: Safety/medical concerns        Walk 50 feet activity   Assist Walk 50 feet with 2 turns activity did not occur: Safety/medical concerns         Walk 150 feet activity   Assist Walk 150 feet activity did not occur: Safety/medical concerns         Walk 10 feet on uneven surface  activity   Assist Walk 10 feet on uneven surfaces activity did not occur: Safety/medical concerns         Wheelchair     Assist Is the patient using a wheelchair?: Yes Type of Wheelchair: Manual Wheelchair activity did not occur: Safety/medical concerns  Wheelchair assist level: Moderate Assistance - Patient 50 - 74% Max wheelchair distance: 74f    Wheelchair 50 feet with 2 turns activity    Assist        Assist Level: Dependent - Patient 0%   Wheelchair 150 feet activity     Assist      Assist Level: Dependent - Patient 0%    Medical Problem List and Plan: 1. Functional deficits secondary to multiple trauma after ground level fall             -patient may not yet shower             -ELOS/Goals: 12-14 days, min assist to supervision goals  D/c 12/1  -Continue CIR therapies including PT, OT, and SLP. Team conf today 2.  Antithrombotics: -DVT/anticoagulation:  Pharmaceutical: Lovenox 30 mg daily due to renal impairment -venous dopplers appear normal (where able to test given RLE splint)             -antiplatelet therapy: none 3. Pain Management: Tylenol, tramadol, Robaxin prn  11/14- will add Norco 5/325 mg q6 hours prn for pain and monitor cognition- cannot increase  tramadol due to renal impairment- will also add Lidoderm patches 3 patches 12 hrs on; 12 hrs off- 8pm-8am  11/15- slept better due to better pain control- con't regimen per nursing, didn't get loopy with meds  11/16- pain well controlled per pt  11/20- due to renal issues, stopped Ultracet TID- can only receive 2 doses/day  11/25 - L great toe pain with mild redness; on chart review Hx gout;  11/28 appears improved; added voltaren to toe  4. Mood/Behavior/Sleep: LCSW to evaluate and provide emotional support  11/21- will add Prozac 10 mg daily             -antipsychotic agents: n/a 5. Neuropsych/cognition: This patient is not capable of making decisions on her own behalf. Consider SLP eval.  11/16- sent SLP eval 6. Skin/Wound Care: Routine skin care checks             -monitor surgical incision 7. Fluids/Electrolytes/Nutrition: Routine Is and Os and follow-up chemistries             -ensure adequate PO hydration 8: Right tib-fib fracture s/p ORIF 11/2 -NWB RLE  -follow-up with Dr. Mardelle Matte 9: Right non-displaced wrist fracture: WBAT tolerated through elbow             -maintain wrist splint             -follow-up with Dr. 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   11/23- Cr up to 2.99 and BUN up to 47 from 32- will start IVFs NS 75cc/hour x 24 hours and recheck in AM- will d/w nursing to push PO fluids at all times.   11/28 BUN/CR trending up, K+ up as well   -it appears baseline Cr around 2.75   -f/u again on Thursday 13: Hyperlipidemia: continue Lipitor 14: DM2: CBGs 4 times daily             -continue Levemir 10 units daily             -continue sliding scale              -consider restarting home insulin 4 units with meals  11/14- will monitor trend- is 91-223 in last 24 hours, but give 1 more day before adding insulin  11/27- CBG's looking good- con't regimen 15: Constipation: continue senna and Colace daily; prns  11/23- BM's yesterday and this AM  11/28. No bm's sinc 11/26 but they were substantial -senna-s tonight, sorbitol tomorrow if needed 16: Anemia; multi-factorial: follow-up hgb stable 17: Leukocytosis/low grade fever: abx for pneumonia continue  11/14- WBC down to 15k from 16.4- on Rocephin  11/15- recheck in AM  11/16- WBC 10.3-  11/20- WBC down to 8.1  11/WBC's trending back up (14.5)---   -cxr with b/l pleural effusions   -check ua and ucx 18: AKI: in setting of CKD4 serum creatinine improving, still elevated (baseline 2.3-2.6)             -follow-up on BMP  11/14- Cr down to 2.74 from 3.04- so heading in right direction   11/15- recheck in AM  11/16- Cr 2.37- will stop IVFs since at baseline  11/20- Cr up a little to 2.6- will let push fluids and recheck Wednesday  11/21- labs in AM  11/22- Cr 2.84 and BUN 39- will push fluids- if doesn't improve tomorrow, will give some more IVFs. 11/23- will give IVFs for increasing Cr/BUN-  11/24- Cr down to 2.41 and BUN 41- will redo IVFs for 1 more day and recheck in AM- baseline Cr ~ 2.2- will give 50cc/hour and start at 2pm to avoid therapies.  11/25 - Cr  2.40, BUN 42; stable/unchanged with IVF. As above, now within baseline. DC IVF, encourage PO (4-6 250 cc cups), labs Monday. 11/28 dc ivf 19. Confusion  See above 20. Allergies  11/20- will write for pt's Zyrtec - brought from home. and stop Claritin 21. Depression  11/21- will start Prozac 10 mg daily-   11/23- not drinking- will ask nursing to push fluids     LOS: 15 days A FACE TO [place] 06/03/2022, 2:07 PM

## 2022-06-03 NOTE — Progress Notes (Signed)
Occupational Therapy Session Note  Patient Details  Name: Miranda Roman MRN: [id number] Date of Birth: 13-Jan-1946  Today's Date: 06/03/2022 OT Individual Time: 1330-1425 OT Individual Time Calculation (min): 55 min    Short Term Goals: Week 2:  OT Short Term Goal 1 (Week 2): Pt will complete LB bathing task with Max A at bed level. OT Short Term Goal 2 (Week 2): Pt will complete LB bathing task with Max A at bed level.  Skilled Therapeutic Interventions/Progress Updates:    Pt resting in w/c upon arrival with caregiver present. OT intervention with focus on RUE GMC/FMC activities and BUE general strengthening to increase pt's independence with BADLs. Table tasks completing pattern with table tennis balls following prescribed pattern. Pt used Bil hands to assist with manipulating balls. Min verbal cues for correct completion of pattern. Pt also engaged in tapping small beach ball with BUE-4x10. LUE chest presses with 2# bar bell 3x10. Pt returned to room and remained in w/c. All needs within reach and care giver present.   Therapy Documentation Precautions:  Precautions Precautions: Fall Precaution Comments: R knee pain - hold leg up Required Braces or Orthoses: Splint/Cast Splint/Cast: RUE and RLE Restrictions Weight Bearing Restrictions: Yes RUE Weight Bearing: Weight bearing as tolerated RLE Weight Bearing: Non weight bearing Other Position/Activity Restrictions: Can bear weight through right elbow, not wrist  Pain:  Pt denies pain this afternoon   Therapy/Group: Individual Therapy  [name] 06/03/2022, 2:31 PM

## 2022-06-03 NOTE — Progress Notes (Signed)
Occupational Therapy Session Note  Patient Details  Name: Miranda Roman MRN: [id number] Date of Birth: [birth year]  Today's Date: 06/03/2022 OT Individual Time: 4045-9136 OT Individual Time Calculation (min): 70 min    Short Term Goals: Week 2:  OT Short Term Goal 1 (Week 2): Pt will complete LB bathing task with Max A at bed level. OT Short Term Goal 2 (Week 2): Pt will complete LB bathing task with Max A at bed level.  Skilled Therapeutic Interventions/Progress Updates:    PT eating breakfast in bed upon arrival; IV running. OT intervention with focus on LB bathing/dressing, bed mobility, and SB transfer. Pt incontinent of bladder. Pt stated she thought she was wet but didn't tell anyone. Total A for hygiene and clothing mgmt at bed level. Tot A for donning pants at bed level. Pt able to partially bridge using LLE. Rolling to Rt with min A/CGA and max A rolling to Lt to facilitate pulling pans over hips. Supine>sit EOB with mod A and max verbal cues for sequencing and safety awareness. Sitting balance EOB with supervision. Pt able to weight bear through Rt elbow to facilitate placement of SB. SB transfer with max A and max verbal cues for sequencing/safety. Pt completed grooming seated in w/c with setup assist. Pt remained in w/c with belt alarm activated. All needs within reach.   Therapy Documentation Precautions:  Precautions Precautions: Fall Precaution Comments: R knee pain - hold leg up Required Braces or Orthoses: Splint/Cast Splint/Cast: RUE and RLE Restrictions Weight Bearing Restrictions: Yes RUE Weight Bearing: Weight bearing as tolerated RLE Weight Bearing: Non weight bearing Other Position/Activity Restrictions: Can bear weight through right elbow, not wrist   Pain: Pt denies pain at rest but reports increased discomfort in RLE with transitional movements  Therapy/Group: Individual Therapy  [name] 06/03/2022, 9:28 AM

## 2022-06-03 NOTE — Plan of Care (Signed)
  Problem: Sit to Stand Goal: LTG:  Patient will perform sit to stand in prep for activites of daily living with assistance level (OT) Description: LTG:  Patient will perform sit to stand in prep for activites of daily living with assistance level (OT) Outcome: Not Applicable Flowsheets (Taken 06/03/2022 1149) LTG: PT will perform sit to stand in prep for activites of daily living with assistance level: (d/c goal at this time) -- Note: D/c goal at this time    Problem: RH Bathing Goal: LTG Patient will bathe all body parts with assist levels (OT) Description: LTG: Patient will bathe all body parts with assist levels (OT) Flowsheets (Taken 06/03/2022 1149) LTG: Pt will perform bathing with assistance level/cueing: (downgraded JLS) Moderate Assistance - Patient 50 - 74% LTG: Position pt will perform bathing:  Supine in bed  Other (Comment) Note: Bed level and w/c  Downgraded JLS    Problem: RH Dressing Goal: LTG Patient will perform lower body dressing w/assist (OT) Description: LTG: Patient will perform lower body dressing with assist, with/without cues in positioning using equipment (OT) Flowsheets (Taken 06/03/2022 1149) LTG: Pt will perform lower body dressing with assistance level of: (downgraded JLS) Maximal Assistance - Patient 25 - 49% Note: Downgraded JLS    Problem: RH Toileting Goal: LTG Patient will perform toileting task (3/3 steps) with assistance level (OT) Description: LTG: Patient will perform toileting task (3/3 steps) with assistance level (OT)  Outcome: Not Applicable Flowsheets (Taken 06/03/2022 1149) LTG: Pt will perform toileting task (3/3 steps) with assistance level: (d/c goal at this time) -- Note: D/c goal    Problem: RH Toilet Transfers Goal: LTG Patient will perform toilet transfers w/assist (OT) Description: LTG: Patient will perform toilet transfers with assist, with/without cues using equipment (OT) Outcome: Not Applicable Flowsheets (Taken  06/03/2022 1149) LTG: Pt will perform toilet transfers with assistance level of: (d/c goal at this time) -- Note: D/c goal at this time

## 2022-06-03 NOTE — Patient Care Conference (Signed)
Inpatient RehabilitationTeam Conference and Plan of Care Update Date: 06/03/2022   Time: 11:02 AM    Patient Name: Miranda Roman      Medical Record Number: [id number]  Date of Birth: 08/27/1945 Sex: Female         Room/Bed: 4M02C/4M02C-01 Payor Info: Payor: [place] / Plan: [facility] MEDICARE / Product Type: *No Product type* /    Admit Date/Time:  05/19/2022  2:52 PM  Primary Diagnosis:  Critical polytrauma  Hospital Problems: Principal Problem:   Critical polytrauma Active Problems:   Right tibial fracture   Mild cognitive disorder    Expected Discharge Date: Expected Discharge Date: 06/06/22  Team Members Present: Physician leading conference: Dr. Alger Simons Social Worker Present: [name], [place] Nurse Present: Tacy Learn, RN PT Present: Ailene Rud, PT OT Present: Roanna Epley, COTA;Jennifer Tamala Julian, OT PPS Coordinator present : Gunnar Fusi, SLP     Current Status/Progress Goal Weekly Team Focus  Bowel/Bladder   incontinent of b/b; LBM: 11/26   continue time toielting Q2-4hr   assist with toileting needs QS and prn    Swallow/Nutrition/ Hydration               ADL's   bathing-mod A at bed level and seated EOB or w/c; UB dressing-min A; LB dressing-tot A; SB tranfsers max A   bathing-min A; LB dressing-mod A; toilet transfers-min A; toileting-mod A (to be downgraded)   functional transfers, BADLs, education, discharge planning    Mobility   max-tot bed mobility and SBT, mod x 2 STS but able to maintain 1-2 minutes, initiated family training   max A transfers  transfers, pain management    Communication                Safety/Cognition/ Behavioral Observations               Pain   no c/o pain   remain pain free   assess pain QS and prn    Skin   hard cast on RUE and RLE   remain free of new skin breakdown/infection  assess skin QS and prn      Discharge Planning:  Pt lives with her dtr and she  receives an aide M-F 10am-12pm. Plans to hire 24/7 care if needed at discharge. SW will confirm there are no barriers to discharge. Fam edu 11/27 and 11/19 8am-11am with pt children.   Team Discussion: Critical polytrauma. Incontinent x 2 with time toileting. Pain managed with PRN medications. Gout improved. Trazodone for sleep. Will try to do real car transfer this week. Rolling in bed can be min A. Hospital bed ordered.  Patient on target to meet rehab goals: no, goals downgraded  *See Care Plan and progress notes for long and short-term goals.   Revisions to Treatment Plan:  Medication adjustment, encourage fluids, monitor labs  Teaching Needs: Medications, safety, transfer training, weight bearing restrictions  Current Barriers to Discharge: Decreased caregiver support, Home enviroment access/layout, Incontinence, Wound care, Weight bearing restrictions, and Behavior  Possible Resolutions to Barriers: Family education, nursing educations, skin/wound care, order recommended DME     Medical Summary Current Status: polytrauma, pain controlled. gout improved. incontinent, cognition improved but some baseline deficits  Barriers to Discharge: Medical stability   Possible Resolutions to [employer] Focus: daily assessment of labs and pt data, pain control, pushing fluids   Continued Need for Acute Rehabilitation Level of Care: The patient requires daily medical management by a physician with specialized training in  physical medicine and rehabilitation for the following reasons: Direction of a multidisciplinary physical rehabilitation program to maximize functional independence : Yes Medical management of patient stability for increased activity during participation in an intensive rehabilitation regime.: Yes Analysis of laboratory values and/or radiology reports with any subsequent need for medication adjustment and/or medical intervention. : Yes   I attest that I was present, lead  the team conference, and concur with the assessment and plan of the team.   Ernest Pine 06/03/2022, 5:20 PM

## 2022-06-03 NOTE — Progress Notes (Signed)
Physical Therapy Session Note  Patient Details  Name: Miranda Roman MRN: [id number] Date of Birth: 04/02/1946  Today's Date: 06/03/2022 PT Individual Time: 1130-1200, 1445-1530 PT Individual Time Calculation (min): 30 min, 45 min  Short Term Goals: Week 2:  PT Short Term Goal 1 (Week 2): =LTGs d/t ELOS  Skilled Therapeutic Interventions/Progress Updates:    Pt seated in w/c on arrival and agreeable to therapy. Pt reports pain only with mobility, premedicated. Rest and positioning provided as needed. Pt's son reports that nsg staff had requested toileting when she was able. Pt performed slideboard transfer <>BSC with mod x 2 and step by step cueing. Pt performed Sit to stand with max A x 2 several times during session for tot A clothing management. Pt with RLE on therapist's foot to maintain NWB. Pt returned to w/c in same manner and was left with all needs in reach and alarm active.   Session 2: Pt seated in w/c on arrival and agreeable to therapy. Pt reports pain only with mobility. Pt transported to therapy gym for time management and energy conservation. Pt performed slideboard transfer <>mat table for family training with therapist and Seth Bake, max-tot A. Pt requires cues throughout, but demoes improving carryover from previous sessions. Pt reports that she her brief is wet, so returned to room. Max A slideboard transfer to bed. Sit>supine with min A for RLE management and cues for RUE management. Pt participated in rolling with supervision to R side and min A to L side for dependent brief change. Pt able to partially bridge for clothing management. Pt also performed SL bridge 2 x 10 for functional independence with ADLs. Pt remained in bed and was left with all needs in reach and alarm active.   Therapy Documentation Precautions:  Precautions Precautions: Fall Precaution Comments: R knee pain - hold leg up Required Braces or Orthoses: Splint/Cast Splint/Cast: RUE and  RLE Restrictions Weight Bearing Restrictions: Yes RUE Weight Bearing: Weight bearing as tolerated RLE Weight Bearing: Non weight bearing Other Position/Activity Restrictions: Can bear weight through right elbow, not wrist General:       Therapy/Group: Individual Therapy  Mickel Fuchs 06/03/2022, 12:05 PM

## 2022-06-03 NOTE — Progress Notes (Signed)
Collection for u/a and culture sent to lab, clean catch

## 2022-06-04 LAB — GLUCOSE, CAPILLARY
Glucose-Capillary: 144 mg/dL — ABNORMAL HIGH (ref 70–99)
Glucose-Capillary: 137 mg/dL — ABNORMAL HIGH (ref 70–99)
Glucose-Capillary: 89 mg/dL (ref 70–99)
Glucose-Capillary: 127 mg/dL — ABNORMAL HIGH (ref 70–99)

## 2022-06-04 MED ORDER — CEPHALEXIN 250 MG PO CAPS
500.0000 mg | ORAL_CAPSULE | Freq: Two times a day (BID) | ORAL | Status: DC
  Administered 2022-06-04 – 2022-06-05 (×3): 500 mg via ORAL
  Filled 2022-06-04 (×3): qty 2

## 2022-06-04 MED ORDER — FLUCONAZOLE 200 MG PO TABS
200.0000 mg | ORAL_TABLET | Freq: Once | ORAL | Status: AC
  Administered 2022-06-04: 200 mg via ORAL
  Filled 2022-06-04: qty 1

## 2022-06-04 MED ORDER — NYSTATIN 100000 UNIT/GM EX CREA
TOPICAL_CREAM | Freq: Two times a day (BID) | CUTANEOUS | Status: DC
  Filled 2022-06-04: qty 30

## 2022-06-04 NOTE — Progress Notes (Signed)
PROGRESS NOTE   Subjective/Complaints:  Pt still with urinary frequency and incontinence. She denies other issues this morning other than soreness in left foot  ROS: Patient denies fever, rash, sore throat, blurred vision, dizziness, nausea, vomiting, diarrhea, cough, shortness of breath or chest pain,   headache, or mood change.   Objective:   No results found. Recent Labs    06/02/22 0546 06/03/22 1054  WBC 11.9* 14.5*  HGB 9.4* 8.8*  HCT 29.1* 27.9*  PLT 439* 408*    Recent Labs    06/02/22 0546 06/03/22 0753  NA 139 141  K 4.1 5.0  CL 107 111  CO2 21* 18*  GLUCOSE 112* 113*  BUN 44* 47*  CREATININE 2.68* 2.80*  CALCIUM 8.5* 8.6*    Intake/Output Summary (Last 24 hours) at 06/04/2022 0845 Last data filed at 06/03/2022 1900 Gross per 24 hour  Intake 240 ml  Output --  Net 240 ml        Physical Exam: Vital Signs Blood pressure 128/72, pulse 94, temperature (!) 100.4 F (38 C), temperature source Oral, resp. rate 16, height 5' 4" (1.626 m), weight 78.9 kg, SpO2 99 %.    Constitutional: No distress . Vital signs reviewed. HEENT: NCAT, EOMI, oral membranes moist Neck: supple Cardiovascular: RRR without murmur. No JVD    Respiratory/Chest: CTA Bilaterally without wheezes or rales. Normal effort    GI/Abdomen: BS +, non-tender, non-distended Ext: no clubbing, cyanosis, or edema Psych: pleasant and cooperative  Neurological: alert, but impaired cognition- chronic   Musculoskeletal:     Cervical back: Normal range of motion.     Comments: Right lower leg surgical dressing in place; right forearm splint in place with ACE wrap. RUE tender to minimal palpation and movement.  Can wiggle toes on RLE, grip with right hand- no change Skin:    General: Skin is warm and dry Neurological:     Mental Status: She is alert and oriented to person, place, and time.     Comments: Underlying cognitive impairment.  Oriented to person, place, reason she's here. Fair insight and awareness. Normal speech, language. CN exam unremarkable. LUE 4/5. RUE limited by pain/ortho, good grip. Does move all muscle groups. LLE 2/5 prox to 4/5 distal. RLE tr-1/5 prox to 3/5 distal (wiggles toes within splint). No focal sensory loss.     Assessment/Plan: 1. Functional deficits which require 3+ hours per day of interdisciplinary therapy in a comprehensive inpatient rehab setting. Physiatrist is providing close team supervision and 24 hour management of active medical problems listed below. Physiatrist and rehab team continue to assess barriers to discharge/monitor patient progress toward functional and medical goals  Care Tool:  Bathing    Body parts bathed by patient: Right arm, Left arm, Chest, Abdomen, Front perineal area, Right upper leg, Left upper leg, Face   Body parts bathed by helper: Buttocks, Left lower leg Body parts n/a: Right lower leg   Bathing assist Assist Level: Moderate Assistance - Patient 50 - 74%     Upper Body Dressing/Undressing Upper body dressing   What is the patient wearing?: Pull over shirt    Upper body assist Assist Level: Minimal Assistance -  Patient > 75%    Lower Body Dressing/Undressing Lower body dressing      What is the patient wearing?: Incontinence brief, Pants     Lower body assist Assist for lower body dressing: Total Assistance - Patient < 25%     Toileting Toileting    Toileting assist Assist for toileting: Total Assistance - Patient < 25%     Transfers Chair/bed transfer  Transfers assist  Chair/bed transfer activity did not occur: Safety/medical concerns  Chair/bed transfer assist level: Total Assistance - Patient < 25%     Locomotion Ambulation   Ambulation assist   Ambulation activity did not occur: Safety/medical concerns          Walk 10 feet activity   Assist  Walk 10 feet activity did not occur: Safety/medical concerns         Walk 50 feet activity   Assist Walk 50 feet with 2 turns activity did not occur: Safety/medical concerns         Walk 150 feet activity   Assist Walk 150 feet activity did not occur: Safety/medical concerns         Walk 10 feet on uneven surface  activity   Assist Walk 10 feet on uneven surfaces activity did not occur: Safety/medical concerns         Wheelchair     Assist Is the patient using a wheelchair?: Yes Type of Wheelchair: Manual Wheelchair activity did not occur: Safety/medical concerns  Wheelchair assist level: Moderate Assistance - Patient 50 - 74% Max wheelchair distance: 89f    Wheelchair 50 feet with 2 turns activity    Assist        Assist Level: Dependent - Patient 0%   Wheelchair 150 feet activity     Assist      Assist Level: Dependent - Patient 0%    Medical Problem List and Plan: 1. Functional deficits secondary to multiple trauma after ground level fall             -patient may not yet shower             -ELOS/Goals: 12-14 days, min assist to supervision goals  D/c 12/1  --Continue CIR therapies including PT, OT, and SLP  2.  Antithrombotics: -DVT/anticoagulation:  Pharmaceutical: Lovenox 30 mg daily due to renal impairment -venous dopplers appear normal (where able to test given RLE splint)             -antiplatelet therapy: none 3. Pain Management: Tylenol, tramadol, Robaxin prn  11/14- will add Norco 5/325 mg q6 hours prn for pain and monitor cognition- cannot increase tramadol due to renal impairment- will also add Lidoderm patches 3 patches 12 hrs on; 12 hrs off- 8pm-8am  11/15- slept better due to better pain control- con't regimen per nursing, didn't get loopy with meds  11/16- pain well controlled per pt  11/20- due to renal issues, stopped Ultracet TID- can only receive 2 doses/day  11/25 - L great toe pain with mild redness; on chart review Hx gout;  11/28-29 appears improved; added voltaren to  toe  4. Mood/Behavior/Sleep: LCSW to evaluate and provide emotional support  11/21- will add Prozac 10 mg daily             -antipsychotic agents: n/a 5. Neuropsych/cognition: This patient is not capable of making decisions on her own behalf. Consider SLP eval.  11/16- sent SLP eval 6. Skin/Wound Care: Routine skin care checks             -  monitor surgical incision 7. Fluids/Electrolytes/Nutrition: Routine Is and Os and follow-up chemistries             -ensure adequate PO hydration 8: Right tib-fib fracture s/p ORIF 11/2 -NWB RLE  -follow-up with Dr. Mardelle Matte 9: Right non-displaced wrist fracture: WBAT tolerated through elbow             -maintain wrist splint             -follow-up with Dr. 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   11/23- Cr up to 2.99 and BUN up to 47 from 32- will start IVFs NS 75cc/hour x 24 hours and recheck in AM- will d/w nursing to push PO fluids at all times.   11/28 BUN/CR trending up, K+ up as well   -it appears baseline Cr around 2.75   -f/u again on Thursday 13: Hyperlipidemia: continue Lipitor 14: DM2: CBGs 4 times daily             -continue Levemir 10 units daily             -continue sliding scale             -consider restarting home insulin 4 units with meals  11/14- will monitor trend- is 91-223 in last 24 hours, but give 1 more day before adding insulin  11/27- CBG's looking good- con't regimen 15: Constipation: continue senna and Colace daily; prns  11/23- BM's yesterday and this AM  11/28. No bm's sinc 11/26 but they were substantial 11/29 large bm this morning 16: Anemia; multi-factorial: follow-up hgb stable 17:  Leukocytosis/low grade fever: abx for pneumonia continue     11/28WBC's trending back up (14.5)---   -cxr with b/l pleural effusions  11/29 -UA +, UCX pending    -begin empiric keflex 18: AKI: in setting of CKD4 serum creatinine improving, still elevated (baseline 2.3-2.6)             -follow-up on BMP  11/14- Cr down to 2.74 from 3.04- so heading in right direction   11/15- recheck in AM  11/16- Cr 2.37- will stop IVFs since at baseline  11/20- Cr up a little to 2.6- will let push fluids and recheck Wednesday  11/21- labs in AM  11/22- Cr 2.84 and BUN 39- will push fluids- if doesn't improve tomorrow, will give some more IVFs. 11/23- will give IVFs for increasing Cr/BUN-  11/24- Cr down to 2.41 and BUN 41- will redo IVFs for 1 more day and recheck in AM- baseline Cr ~ 2.2- will give 50cc/hour and start at 2pm to avoid therapies.  11/25 - Cr  2.40, BUN 42; stable/unchanged with IVF. As above, now within baseline. DC IVF, encourage PO (4-6 250 cc cups), labs Monday. 11/28 dc'ed ivf 19. Confusion  See above 20. Allergies  11/20- will write for pt's Zyrtec - brought from home. and stop Claritin 21. Depression  11/21-   Prozac 10 mg daily-         LOS: 16 days A FACE TO FACE EVALUATION WAS PERFORMED  Meredith Staggers 06/04/2022, 8:45 AM

## 2022-06-04 NOTE — Progress Notes (Signed)
Patient ID: Miranda Roman, female   DOB: 14-Nov-1945, 76 y.o.   MRN: [id number]  SW spoke with pt dtr Miranda Roman [phone]) to inform on received messages, and therapy informed family that a roho cushion would not be covered. SW will order transfer board. Pt dtr confirms she is waiting to hear from [facility] about hospital bed delivery. She reports previous HH was [facility] and they prefer this agency. SW discussed limited support at time of discharge. SW emailed pt Paediatric nurse for her review.   SW sent HHPT/OT/SN/SLP/aide referral to [facility]. *HHPT/OT/SLP accepted; OT will address self-care needs.   [name], MSW, [place] Office: [phone] Cell: [phone] Fax: [phone]

## 2022-06-04 NOTE — Progress Notes (Signed)
Physical Therapy Session Note  Patient Details  Name: Miranda Roman MRN: [id number] Date of Birth: 27-Feb-1946  Today's Date: 06/04/2022 PT Individual Time: 3762-8315 and 1761-6073 PT Individual Time Calculation (min): 72 min and 76 min  Short Term Goals: Week 2:  PT Short Term Goal 1 (Week 2): =LTGs d/t ELOS  Skilled Therapeutic Interventions/Progress Updates: Pt presented in w/c with [name], and [name] (dgt and son) present for hands on session for transfers and Sit to stand. Pt transported to ortho gym for time management and energy conservation. PTA demonstrated set up for Slide board to dgt and demonstrated before actual transfer most optimal body mechanics as well as head/hips relationship. PTA then demonstrated Slide board transfer to dgt requiring maxA x1 for transfer with PTA also advising how to be done as +2. Pt noted to provide minimal assist and possible difficulty motor planning when trying to scoot towards mat. Pt did note to get "stuck" when trying to lean onto elbow when cued by family. Pt then participated in transfer to L with son and dgt participating. Son was able to provide improved cues to dgt to help facilitate scoot. Dgt then performed transfer to R requiring min cues for proper set up of board but was able to perform transfer primarily without assist and demonstrating fair safety. While sitting at mat PTA obtained PFRW and pt performed Sit to stand with maxA x 2 with PTA and son performing. PTA demonstrated placing foot under pt's RLE to decrease opportunity to weight bear. Pt then expressed need for toilet. Son the set up Slide board and was able to perform transfer to L maxA x 1 to w/c. Pt then set up and transported back to room. PTA then demonstrated DABSC and son and dgt then performed Slide board transfer to [facility]. Pt then performed Sit to stand maxA x 2 with PTA and dgt with dgt providing total A for clothing management. Pt with continent urinary void and BM, then  performed Sit to stand in same manner to allow PTA to complete peri-care while dgt completed clothing management. Slide board transfer then completed with son and dgt then transferred back to bed via Slide board with son. Pt left EOB to complete transfer to bed with with current needs met.    Tx2: Pt presented in bed agreeable to therapy. Pt denies pain at rest but c/o pain with mobility. Session focused on transfers and scooting. Pt performed supine to sit with significantly increased time but CGA and heavy use of bed features. Pt required increased time to scoot to EOB however was able to complete with PTA supporting RLE and providing  mod multimodal cues to maintain RLE NWB. Pt then set up total A and performed Slide board transfer to L to w/c with maxA x 1 with +1 present for safety. Pt transported to rehab gym and pt set up total A and performed Slide board transfer to R with maxA x1 with significantly increased time. Pt then participated in block practice scooting L/R with pt demonstrating difficulty motor planning proper sequencing to both L and R. PTA set up with with max multimodal cues and PTA providing facilitation via Bobath technique to improve anterior weight shifting and encourage pushing through LUE. Pt noted to push trunk posteriorly with all attempts. PTA then provided minimal cues and asked pt to scoot to R with pt able to problem solve to some improved degree positioning and noted decreased push posteriorly however pt unable to generate enough power through  L side to shift weight. As nearing end of session pt set up for transfer back to w/c and when pt was within reach of w/c arm rest pt was able to assist in pulling body into w/c. Pt transported back to room and performed Slide board transfer maxA x 1 with PTA providing cues for push away from w/c with some improved success. Pt was modA x 1 sit to supine and modA to reposition to comfort. Pt left in bed at end of session with bed alarm on,  call bell within reach and needs met.      Therapy Documentation Precautions:  Precautions Precautions: Fall Precaution Comments: R knee pain - hold leg up Required Braces or Orthoses: Splint/Cast Splint/Cast: RUE and RLE Restrictions Weight Bearing Restrictions: Yes RUE Weight Bearing: Weight bearing as tolerated RLE Weight Bearing: Non weight bearing Other Position/Activity Restrictions: Can bear weight through right elbow, not wrist General:   Vital Signs: Therapy Vitals Temp: 98.4 F (36.9 C) Temp Source: Oral Pulse Rate: 82 Resp: 18 BP: (!) 140/68 Patient Position (if appropriate): Lying Oxygen Therapy SpO2: 98 % O2 Device: Room Air Pain:   Mobility:   Locomotion :    Trunk/Postural Assessment :    Balance:   Exercises:   Other Treatments:      Therapy/Group: Individual Therapy  Jermal Dismuke 06/04/2022, 3:55 PM

## 2022-06-04 NOTE — Progress Notes (Signed)
Occupational Therapy Session Note  Patient Details  Name: Miranda Roman MRN: [id number] Date of Birth: Sep 10, 1945  Today's Date: 06/04/2022 OT Individual Time: 2902-1115 OT Individual Time Calculation (min): 55 min  and Today's Date: 06/04/2022 OT Missed Time: 20 Minutes Missed Time Reason: Nursing care   Short Term Goals: Week 2:  OT Short Term Goal 1 (Week 2): Pt will complete LB bathing task with Max A at bed level. OT Short Term Goal 2 (Week 2): Pt will complete LB bathing task with Max A at bed level.  Skilled Therapeutic Interventions/Progress Updates:    Pt missed 20 mins therapy 2/2 nursing care at beginning of session. Pt incontinent of bowel and bladder. Pt's daughter present for education. Reviewed ADL sequence and recommendations. LB dressing at bed level with with tot A. Supine>sit EOB with mod A. Sitting balance EOB with supervision. SB transfer to w/c with max A and +2 to steady w/c. Recommended +2 for SB tranfsers. UB bathing/dressing with supervision/CGA seated in w/c. Grooming with supervision. Recommended SB tranfsers to [facility] and then standing for clothing mgmt. Demonstrated operation of DABSC. Son joined session near end. Pt remained seated in w/c with daughter and son present.   Therapy Documentation Precautions:  Precautions Precautions: Fall Precaution Comments: R knee pain - hold leg up Required Braces or Orthoses: Splint/Cast Splint/Cast: RUE and RLE Restrictions Weight Bearing Restrictions: Yes RUE Weight Bearing: Weight bearing as tolerated RLE Weight Bearing: Non weight bearing Other Position/Activity Restrictions: Can bear weight through right elbow, not wrist General: General OT Amount of Missed Time: 20 Minutes Pain:  Pt reports discomfort in RLE with transitional movements but quickly resolved with repositioning   Therapy/Group: Individual Therapy  [name] 06/04/2022, 9:35 AM

## 2022-06-05 LAB — GLUCOSE, CAPILLARY
Glucose-Capillary: 128 mg/dL — ABNORMAL HIGH (ref 70–99)
Glucose-Capillary: 126 mg/dL — ABNORMAL HIGH (ref 70–99)
Glucose-Capillary: 118 mg/dL — ABNORMAL HIGH (ref 70–99)
Glucose-Capillary: 124 mg/dL — ABNORMAL HIGH (ref 70–99)

## 2022-06-05 LAB — URINE CULTURE: Culture: >=100000 — AB

## 2022-06-05 LAB — BASIC METABOLIC PANEL
Anion gap: 11 (ref 5–15)
BUN: 37 mg/dL — ABNORMAL HIGH (ref 8–23)
CO2: 23 mmol/L (ref 22–32)
Calcium: 8.7 mg/dL — ABNORMAL LOW (ref 8.9–10.3)
Chloride: 107 mmol/L (ref 98–111)
Creatinine, Ser: 2.64 mg/dL — ABNORMAL HIGH (ref 0.44–1.00)
GFR, Estimated: 18 mL/min — ABNORMAL LOW (ref >60)
Glucose, Bld: 114 mg/dL — ABNORMAL HIGH (ref 70–99)
Potassium: 3.9 mmol/L (ref 3.5–5.1)
Sodium: 141 mmol/L (ref 135–145)

## 2022-06-05 LAB — CBC
HCT: 27 % — ABNORMAL LOW (ref 36.0–46.0)
Hemoglobin: 8.9 g/dL — ABNORMAL LOW (ref 12.0–15.0)
MCH: 25.4 pg — ABNORMAL LOW (ref 26.0–34.0)
MCHC: 33 g/dL (ref 30.0–36.0)
MCV: 77.1 fL — ABNORMAL LOW (ref 80.0–100.0)
Platelets: 368 10*3/uL (ref 150–400)
RBC: 3.5 MIL/uL — ABNORMAL LOW (ref 3.87–5.11)
RDW: 18 % — ABNORMAL HIGH (ref 11.5–15.5)
WBC: 8.8 10*3/uL (ref 4.0–10.5)
nRBC: 0 % (ref 0.0–0.2)

## 2022-06-05 MED ORDER — CIPROFLOXACIN HCL 500 MG PO TABS
500.0000 mg | ORAL_TABLET | Freq: Every day | ORAL | Status: DC
  Administered 2022-06-05 – 2022-06-06 (×2): 500 mg via ORAL
  Filled 2022-06-05 (×2): qty 1

## 2022-06-05 NOTE — Progress Notes (Signed)
PROGRESS NOTE   Subjective/Complaints:  Pt w/ urinary frequency and incontinence still. Otherwise feeling well. Denied pain. Just got hair washed with OT!  ROS: Patient denies fever, rash, sore throat, blurred vision, dizziness, nausea, vomiting, diarrhea, cough, shortness of breath or chest pain, joint or back/neck pain, headache, or mood change.   Objective:   No results found. Recent Labs    06/03/22 1054 06/05/22 0711  WBC 14.5* 8.8  HGB 8.8* 8.9*  HCT 27.9* 27.0*  PLT 408* 368    Recent Labs    06/03/22 0753 06/05/22 0711  NA 141 141  K 5.0 3.9  CL 111 107  CO2 18* 23  GLUCOSE 113* 114*  BUN 47* 37*  CREATININE 2.80* 2.64*  CALCIUM 8.6* 8.7*    Intake/Output Summary (Last 24 hours) at 06/05/2022 4098 Last data filed at 06/05/2022 1191 Gross per 24 hour  Intake 857 ml  Output 0 ml  Net 857 ml        Physical Exam: Vital Signs Blood pressure 138/68, pulse 81, temperature 99.1 F (37.3 C), temperature source Oral, resp. rate 18, height _0  (1.626 m), weight 78.9 kg, SpO2 99 %.    Constitutional: No distress . Vital signs reviewed. HEENT: NCAT, EOMI, oral membranes moist Neck: supple Cardiovascular: RRR without murmur. No JVD    Respiratory/Chest: CTA Bilaterally without wheezes or rales. Normal effort    GI/Abdomen: BS +, non-tender, non-distended Ext: no clubbing, cyanosis, or edema Psych: pleasant and cooperative  Musculoskeletal:     Cervical back: Normal range of motion.     Comments: Right lower leg surgical dressing in place; right forearm splint in place with ACE wrap. RUE tender to minimal palpation and movement.  Can wiggle toes on RLE, grip with right hand- no change Skin:    General: Skin is warm and dry Neurological:     Mental Status: She is alert and oriented to person, place, and time.     Comments: alert and Oriented to person, place, reason she's here. Fair insight and  awareness. STM deficits.  Normal speech, language. CN exam unremarkable. LUE 4/5. RUE limited by pain/ortho, good grip. Does move all muscle groups. LLE 2/5 prox to 4/5 distal. RLE tr-1/5 prox to 3/5 distal (wiggles toes within splint). Motor exam stable.  No focal sensory loss.     Assessment/Plan: 1. Functional deficits which require 3+ hours per day of interdisciplinary therapy in a comprehensive inpatient rehab setting. Physiatrist is providing close team supervision and 24 hour management of active medical problems listed below. Physiatrist and rehab team continue to assess barriers to discharge/monitor patient progress toward functional and medical goals  Care Tool:  Bathing    Body parts bathed by patient: Right arm, Left arm, Chest, Abdomen, Front perineal area, Right upper leg, Left upper leg, Face   Body parts bathed by helper: Buttocks, Left lower leg Body parts n/a: Right lower leg   Bathing assist Assist Level: Moderate Assistance - Patient 50 - 74%     Upper Body Dressing/Undressing Upper body dressing   What is the patient wearing?: Pull over shirt    Upper body assist Assist Level: Minimal Assistance - Patient >  75%    Lower Body Dressing/Undressing Lower body dressing      What is the patient wearing?: Incontinence brief, Pants     Lower body assist Assist for lower body dressing: Total Assistance - Patient < 25%     Toileting Toileting    Toileting assist Assist for toileting: Total Assistance - Patient < 25%     Transfers Chair/bed transfer  Transfers assist  Chair/bed transfer activity did not occur: Safety/medical concerns  Chair/bed transfer assist level: Total Assistance - Patient < 25%     Locomotion Ambulation   Ambulation assist   Ambulation activity did not occur: Safety/medical concerns          Walk 10 feet activity   Assist  Walk 10 feet activity did not occur: Safety/medical concerns        Walk 50 feet  activity   Assist Walk 50 feet with 2 turns activity did not occur: Safety/medical concerns         Walk 150 feet activity   Assist Walk 150 feet activity did not occur: Safety/medical concerns         Walk 10 feet on uneven surface  activity   Assist Walk 10 feet on uneven surfaces activity did not occur: Safety/medical concerns         Wheelchair     Assist Is the patient using a wheelchair?: Yes Type of Wheelchair: Manual Wheelchair activity did not occur: Safety/medical concerns  Wheelchair assist level: Moderate Assistance - Patient 50 - 74% Max wheelchair distance: 93f    Wheelchair 50 feet with 2 turns activity    Assist        Assist Level: Dependent - Patient 0%   Wheelchair 150 feet activity     Assist      Assist Level: Dependent - Patient 0%    Medical Problem List and Plan: 1. Functional deficits secondary to multiple trauma after ground level fall             -patient may not yet shower             -ELOS/Goals: 12-14 days, min assist to supervision goals  D/c 12/1  -Continue CIR therapies including PT, OT, and SLP  2.  Antithrombotics: -DVT/anticoagulation:  Pharmaceutical: Lovenox 30 mg daily due to renal impairment -venous dopplers appear normal (where able to test given RLE splint)             -antiplatelet therapy: none 3. Pain Management: Tylenol, tramadol, Robaxin prn  11/14- will add Norco 5/325 mg q6 hours prn for pain and monitor cognition- cannot increase tramadol due to renal impairment- will also add Lidoderm patches 3 patches 12 hrs on; 12 hrs off- 8pm-8am  11/15- slept better due to better pain control- con't regimen per nursing, didn't get loopy with meds  11/16- pain well controlled per pt  11/20- due to renal issues, stopped Ultracet TID- can only receive 2 doses/day  11/25 - L great toe pain with mild redness; on chart review Hx gout;  11/230 appears improved; added voltaren to toe  4.  Mood/Behavior/Sleep: LCSW to evaluate and provide emotional support  11/21- will add Prozac 10 mg daily             -antipsychotic agents: n/a 5. Neuropsych/cognition: This patient is not capable of making decisions on her own behalf. Consider SLP eval.  11/16- sent SLP eval 6. Skin/Wound Care: Routine skin care checks             -  monitor surgical incision 7. Fluids/Electrolytes/Nutrition: Routine Is and Os and follow-up chemistries             -ensure adequate PO hydration 8: Right tib-fib fracture s/p ORIF 11/2 -NWB RLE  -follow-up with Dr. Mardelle Matte 9: Right non-displaced wrist fracture: WBAT tolerated through elbow             -maintain wrist splint             -follow-up with Dr. 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   11/23- Cr up to 2.99 and BUN up to 47 from 32- will start IVFs NS 75cc/hour x 24 hours and recheck in AM- will d/w nursing to push PO fluids at all times.   11/30 Cr 2.64--looks like it's within her range. Needs f/u as outpt with renal, BUN actually better   -encourage fluids 13: Hyperlipidemia: continue Lipitor 14: DM2: CBGs 4 times daily             -continue Levemir 10 units daily             -continue sliding scale             -consider restarting home insulin 4 units with meals  11/14- will monitor trend- is 91-223 in last 24 hours, but give 1 more day before adding insulin  11/27- CBG's looking good- con't regimen 15: Constipation: continue senna and Colace daily; prns  11/23- BM's yesterday and this AM  11/28. No bm's sinc 11/26 but they were substantial 11/29 large bm this morning 16: Anemia; multi-factorial: follow-up hgb stable 17:  Leukocytosis/low grade fever: abx for pneumonia continue     11/28WBC's trending back up (14.5)---   -cxr with b/l pleural effusions  11/30 - UCX with 100k GNR    -continue empiric keflex   19. Confusion  See above 20. Allergies  11/20- will write for pt's Zyrtec - brought from home. and stop Claritin 21. Depression  11/21-   Prozac 10 mg daily-         LOS: 17 days A FACE TO FACE EVALUATION WAS PERFORMED  Meredith Staggers 06/05/2022, 9:27 AM

## 2022-06-05 NOTE — Progress Notes (Incomplete)
Inpatient Rehabilitation Discharge Medication Review by a Pharmacist  A complete drug regimen review was completed for this patient to identify any potential clinically significant medication issues.  High Risk Drug Classes Is patient taking? 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    Other       Clinically significant medication issues were identified that warrant physician communication and completion of prescribed/recommended actions by midnight of the next day:  No   Time spent performing this drug regimen review (minutes):  30   Daylee Delahoz BS, PharmD, BCPS Clinical Pharmacist 06/05/2022 12:46 PM  Contact: [phone] after 3 PM  "Be curious, not judgmental..." -Jamal Maes

## 2022-06-05 NOTE — Progress Notes (Signed)
Occupational Therapy Session Note  Patient Details  Name: CARINNA NEWHART MRN: [id number] Date of Birth: 18-Oct-1945  Today's Date: 06/05/2022 OT Individual Time: 1674-2552 OT Individual Time Calculation (min): 70 min    Short Term Goals: Week 2:  OT Short Term Goal 1 (Week 2): Pt will complete LB bathing task with Max A at bed level. OT Short Term Goal 2 (Week 2): Pt will complete LB bathing task with Max A at bed level.  Skilled Therapeutic Interventions/Progress Updates:    Pt resting in bed upon arrival. OT intervention with focus on bed mobility, BADLs, washing hair, sitting balance, SB transfers, ongoing discharge planning. Rolling to Rt with CGA and min A for rolling to Lt in bed using bed rails. See below for bathing/dressing assist levels. Pt assisted with washing hair at sink. Somewhat limited participation 2/2 RUE limited AROM. SB transfer to w/c with max A. Pt pleased with progress and ready for discharge home tomorrow. Pt remained in w/c with belt alarm activated. All needs within reach.   Therapy Documentation Precautions:  Precautions Precautions: Fall Precaution Comments: R knee pain - hold leg up Required Braces or Orthoses: Splint/Cast Splint/Cast: RUE and RLE Restrictions Weight Bearing Restrictions: Yes RUE Weight Bearing: Weight bearing as tolerated RLE Weight Bearing: Non weight bearing Other Position/Activity Restrictions: Can bear weight through right elbow, not wrist   Pain: Pt denies pain at rest but c/o RLE discomfort with transitional movements; repositioned  ADL: ADL Eating: Set up Where Assessed-Eating: Wheelchair Grooming: Setup Where Assessed-Grooming: Wheelchair Upper Body Bathing: Supervision/safety Where Assessed-Upper Body Bathing: Wheelchair, Sitting at sink Lower Body Bathing: Maximal assistance Where Assessed-Lower Body Bathing: Bed level Upper Body Dressing: Supervision/safety Where Assessed-Upper Body Dressing: Wheelchair Lower  Body Dressing: Maximal assistance Where Assessed-Lower Body Dressing: Bed level Toileting: Maximal assistance Where Assessed-Toileting: Bedside Commode Toilet Transfer: Maximal assistance Toilet Transfer Method: Theatre manager: Drop arm bedside commode Tub/Shower Transfer: Not assessed Tub/Shower Transfer Method: Unable to assess   Therapy/Group: Individual Therapy  [name] 06/05/2022, 11:19 AM

## 2022-06-05 NOTE — Progress Notes (Signed)
Patient ID: Miranda Roman, female   DOB: Dec 02, 1945, 76 y.o.   MRN: [id number]  SW made contact with pt dtr [name] to discuss if she has received emails about community resources and sitter list; and also  any updates about hospital bed. States she will have to check email, and also states challenges with getting DME delivered and has been calling but unable to reach anyone. SW informed will follow-up with [facility], and encouraged her to continue to call.   *SW received updates from [facility] to discuss hospital bed. States logistics states they were unable to reach.   SW spoke with pt dtr [name] to inform on above. SW encouraged her to continue to call [place], and informed on [facility] being [facility] as well.   [name], MSW, [place] Office: [phone] Cell: [phone] Fax: [phone]

## 2022-06-05 NOTE — Progress Notes (Signed)
Physical Therapy Discharge Summary  Patient Details  Name: Miranda Roman MRN: [id number] Date of Birth: 12/23/1945  Date of Discharge from PT service:November 68, 2023  Today's Date: 06/05/2022 PT Individual Time: 1300-1400 PT Individual Time Calculation (min): 60 min    Patient has met 6 of 7 long term goals due to improved activity tolerance, improved balance, increased strength, decreased pain, ability to compensate for deficits, and improved coordination.  Patient to discharge at a wheelchair level Max Assist.   Patient's care partner is independent to provide the necessary physical assistance at discharge. Pt to d/c with her daughter and son in law. Family education performed with all available family members who provided appropriate levels of assist.   Reasons goals not met: Pt requires tot A for car transfer.   Recommendation:  Patient will benefit from ongoing skilled PT services in outpatient setting to continue to advance safe functional mobility, address ongoing impairments in strength, ROM, and minimize fall risk.  Equipment: RW platform  Reasons for discharge: treatment goals met and discharge from hospital  Patient/family agrees with progress made and goals achieved: Yes  PT Discharge Precautions/Restrictions Precautions Precautions: Fall Required Braces or Orthoses: Splint/Cast Splint/Cast: RUE and RLE Restrictions Weight Bearing Restrictions: Yes RUE Weight Bearing: Non weight bearing RLE Weight Bearing: Non weight bearing  Pain Pain Assessment Pain Scale: 0-10 Pain Score: 1  Pain Location: Leg Pain Orientation: Right Pain Descriptors / Indicators: Aching;Discomfort Pain Interference Pain Interference Pain Effect on Sleep: 1. Rarely or not at all Pain Interference with Therapy Activities: 1. Rarely or not at all Pain Interference with Day-to-Day Activities: 1. Rarely or not at all Vision/Perception  Vision - History Ability to See in Adequate  Light: 0 Adequate Perception Perception: Within Functional Limits Praxis Praxis: Intact  Cognition Overall Cognitive Status: History of cognitive impairments - at baseline Arousal/Alertness: Awake/alert Orientation Level: Oriented X4 Attention: Sustained;Focused Focused Attention: Appears intact Sustained Attention: Appears intact Memory: Impaired Memory Impairment: Decreased short term memory Decreased Short Term Memory: Verbal basic Awareness: Impaired Awareness Impairment: Emergent impairment Problem Solving: Impaired Sensation Sensation Light Touch: Appears Intact Hot/Cold: Appears Intact Proprioception: Impaired Detail Proprioception Impaired Details: Impaired RUE Stereognosis: Not tested Coordination Gross Motor Movements are Fluid and Coordinated: No Fine Motor Movements are Fluid and Coordinated: No Coordination and Movement Description: Due to WB status and recent fractures pt presents with impaired coordination Finger Nose Finger Test: Slow and delayed BUE Motor  Motor Motor: Other (comment) Motor - Skilled Clinical Observations: delayed, Motor - Discharge Observations: delayed, generalized weakness but improved  Mobility Bed Mobility Bed Mobility: Rolling Right;Rolling Left Rolling Right: Contact Guard/Touching assist Rolling Left: Contact Guard/Touching assist Transfers Transfers: Sit to Stand;Lateral/Scoot Transfers Sit to Stand: 2 Helpers Squat Pivot Transfers: Maximal Assistance - Patient 25-49% Lateral/Scoot Transfers: Maximal Assistance - Patient 25-49% Transfer (Assistive device): Right platform walker Locomotion  Gait Ambulation: No Gait Gait: No Stairs / Additional Locomotion Stairs: No Wheelchair Mobility Wheelchair Mobility: Yes Wheelchair Assistance: Minimal assistance - Patient >75% Wheelchair Propulsion: Left lower extremity;Left upper extremity Wheelchair Parts Management: Needs assistance Distance: 100 ft  Trunk/Postural Assessment   Cervical Assessment Cervical Assessment: Exceptions to [facility] (forward head) Thoracic Assessment Thoracic Assessment: Exceptions to [facility] (rounded shoulders) Lumbar Assessment Lumbar Assessment: Exceptions to [facility] (posterior pelvic tilt) Postural Control Postural Control: Deficits on evaluation  Balance Balance Balance Assessed: Yes Static Sitting Balance Static Sitting - Balance Support: Left upper extremity supported;Feet supported Static Sitting - Level of Assistance: 5: Stand by assistance Dynamic  Sitting Balance Dynamic Sitting - Balance Support: Left upper extremity supported;Feet supported;During functional activity Dynamic Sitting - Level of Assistance: 4: Min assist Extremity Assessment  RUE Assessment Passive Range of Motion (PROM) Comments: Functional shoulder and elbow P/ROM in all ranges. Wrist: N/A due to UE cast and precautions Active Range of Motion (AROM) Comments: Functional shoulder and elbow P/ROM in all ranges. Wrist: N/A due to UE cast and precautions. Limited finger flexion. Able to form a closed fist 25% General Strength Comments: Generalized weakness in shoulder and elbow in all ranges. LUE Assessment LUE Assessment: Exceptions to [facility] Active Range of Motion (AROM) Comments: WNL General Strength Comments: Gneralized weakness RLE Assessment RLE Assessment: Exceptions to [facility] General Strength Comments: 3+/5 proximal hip 3-/5 knee extension LLE Assessment LLE Assessment: Exceptions to [facility] General Strength Comments: grossly 4-/5   [name] 06/05/2022, 12:03 PM

## 2022-06-05 NOTE — Progress Notes (Signed)
Occupational Therapy Discharge Summary  Patient Details  Name: Miranda Roman MRN: [id number] Date of Birth: [birth year]  Date of Discharge from OT service:November 97, 2023   Patient has met 6 of 6 long term goals due to improved activity tolerance and postural control.  Pt made steady progress with BADLs and functional transfers during this admission. Pt completes LB bathing/dressing tasks at bed level. LB dressing with max A. Bathing at overall mod A level. UB dressing with supervision for pullover shirts. SB transfers to w/c and DABSC with max A. Pt's son and daughter have been present and participated in therapy education sessions. Pt pleased with progress and looking forward to discharging home with family and care givers. Patient's care partner is independent to provide the necessary physical assistance at discharge.    Reasons goals not met: n/a  Recommendation:  Patient will benefit from ongoing skilled OT services in home health setting to continue to advance functional skills in the area of BADL and Reduce care partner burden.  Equipment: DABSC, hospital bed  Reasons for discharge: treatment goals met and discharge from hospital  Patient/family agrees with progress made and goals achieved: Yes  OT Discharge ADL ADL Eating: Set up Where Assessed-Eating: Wheelchair Grooming: Setup Where Assessed-Grooming: Wheelchair Upper Body Bathing: Supervision/safety Where Assessed-Upper Body Bathing: Wheelchair, Sitting at sink Lower Body Bathing: Maximal assistance Where Assessed-Lower Body Bathing: Bed level Upper Body Dressing: Supervision/safety Where Assessed-Upper Body Dressing: Wheelchair Lower Body Dressing: Maximal assistance Where Assessed-Lower Body Dressing: Bed level Toileting: Maximal assistance Where Assessed-Toileting: Bedside Commode Toilet Transfer: Maximal assistance Toilet Transfer Method: Theatre manager: Drop arm bedside  commode Tub/Shower Transfer: Not assessed Tub/Shower Transfer Method: Unable to assess Vision Baseline Vision/History: 1 Wears glasses Vision Assessment?: No apparent visual deficits Perception  Perception: Within Functional Limits Praxis Praxis: Intact Cognition Cognition Overall Cognitive Status: History of cognitive impairments - at baseline Arousal/Alertness: Awake/alert Orientation Level: Person;Place;Situation Person: Oriented Place: Oriented Situation: Oriented Memory: Impaired Memory Impairment: Decreased short term memory Decreased Short Term Memory: Verbal basic Attention: Sustained;Focused Focused Attention: Appears intact Sustained Attention: Appears intact Awareness: Impaired Awareness Impairment: Emergent impairment Problem Solving: Impaired Brief Interview for Mental Status (BIMS) Repetition of Three Words (First Attempt): 3 Temporal Orientation: Year: Correct Temporal Orientation: Month: Accurate within 5 days Temporal Orientation: Day: Correct Recall: "Sock": Yes, no cue required Recall: "Blue": Yes, no cue required Recall: "Bed": Yes, no cue required BIMS Summary Score: 15 Sensation Sensation Light Touch: Appears Intact Hot/Cold: Appears Intact Proprioception: Impaired Detail Proprioception Impaired Details: Impaired RUE Stereognosis: Not tested Coordination Gross Motor Movements are Fluid and Coordinated: No Fine Motor Movements are Fluid and Coordinated: No Coordination and Movement Description: Due to WB status and recent fractures pt presents with impaired coordination Finger Nose Finger Test: Slow and delayed BUE Motor  Motor Motor: Other (comment) Motor - Skilled Clinical Observations: delayed, Motor - Discharge Observations: delayed, generalized weakness but improved Trunk/Postural Assessment  Cervical Assessment Cervical Assessment: Exceptions to [facility] (forward head) Thoracic Assessment Thoracic Assessment: Exceptions to [facility] (rounded  shoulders) Lumbar Assessment Lumbar Assessment: Exceptions to [facility] (posterior pelvic tilt) Postural Control Postural Control: Deficits on evaluation  Balance Static Sitting Balance Static Sitting - Balance Support: Left upper extremity supported;Feet supported Static Sitting - Level of Assistance: 5: Stand by assistance Dynamic Sitting Balance Dynamic Sitting - Balance Support: Left upper extremity supported;Feet supported;During functional activity Dynamic Sitting - Level of Assistance: 4: Min assist Extremity/Trunk Assessment RUE Assessment Passive Range of Motion (PROM) Comments:  Functional shoulder and elbow P/ROM in all ranges. Wrist: N/A due to UE cast and precautions Active Range of Motion (AROM) Comments: Functional shoulder and elbow P/ROM in all ranges. Wrist: N/A due to UE cast and precautions. Limited finger flexion. Able to form a closed fist 25% General Strength Comments: Generalized weakness in shoulder and elbow in all ranges. LUE Assessment LUE Assessment: Exceptions to [facility] Active Range of Motion (AROM) Comments: WNL General Strength Comments: Gneralized weakness   [name] 06/05/2022, 9:49 AM

## 2022-06-05 NOTE — Progress Notes (Signed)
Physical Therapy Session Note  Patient Details  Name: Miranda Roman MRN: [id number] Date of Birth: 07-Mar-1946  Today's Date: 06/05/2022 PT Individual Time: 1300-1400 PT Individual Time Calculation (min): 60 min   Short Term Goals: Week 3:     Skilled Therapeutic Interventions/Progress Updates:    Pt seated in w/c on arrival and agreeable to therapy. Pt reports pain with mobility only. Pt transported to therapy gym for time management and energy conservation. Pt performed slideboard transfer <>mat table with max A both directions. Pt able to participate in transfer. Pt participated in scooting practice on mat table with good carryover from previous sessions and improved independence with fewer cues required. Pt participated in discussion of d/c plan and her concern over recently losing a friend to heart pathology. Discussed pt's fears and what chest pain related to cardiac pathology feels like to put her mind at ease. Pt returned to room and performed slideboard transfer to bed with max A. Sit>supine with min A for RLE management only. Pt assisted with bridging to doff pants. Pt's care partner, Seth Bake, present and assisted with changing into gown and positioning in bed. Pt remained in bed and was left with all needs in reach and alarm active.   Therapy Documentation Precautions:  Precautions Precautions: Fall Precaution Comments: R knee pain - hold leg up Required Braces or Orthoses: Splint/Cast Splint/Cast: RUE and RLE Restrictions Weight Bearing Restrictions: Yes RUE Weight Bearing: Non weight bearing RLE Weight Bearing: Non weight bearing Other Position/Activity Restrictions: Can bear weight through right elbow, not wrist General:       Therapy/Group: Individual Therapy  Mickel Fuchs 06/05/2022, 3:47 PM

## 2022-06-05 NOTE — Progress Notes (Signed)
Physical Therapy Session Note  Patient Details  Name: Miranda Roman MRN: [id number] Date of Birth: February 01, 1946  Today's Date: 06/05/2022 PT Individual Time: 1100-1203 PT Individual Time Calculation (min): 63 min   Short Term Goals: Week 2:  PT Short Term Goal 1 (Week 2): =LTGs d/t ELOS  Skilled Therapeutic Interventions/Progress Updates: Pt presented in w/c with son present agreeable to therapy. Pt denies pain at rest. Session focused on hand on functional transfers with car. Pt transported to main entrance and set up for slide board transfer. Son was instructed in proper alignment with son providing cues for use of handle inside vehicle to assist in transfer. Transfer completed max to total A with son requiring intermittent min verbal cues for body mechanics and safety. Pt then required same assist when performing transfer out of vehicle with pt requiring maxA for repositioning to ensure proper placement of slide board. Pt transported back to unit with pt participating in MMT in preparation for d/c. Pt returned to room at end of session and remained in w/c with belt alarm on, call bell within reach and needs met.      Therapy Documentation Precautions:  Precautions Precautions: Fall Precaution Comments: R knee pain - hold leg up Required Braces or Orthoses: Splint/Cast Splint/Cast: RUE and RLE Restrictions Weight Bearing Restrictions: Yes RUE Weight Bearing: Non weight bearing RLE Weight Bearing: Non weight bearing Other Position/Activity Restrictions: Can bear weight through right elbow, not wrist General:   Vital Signs:   Pain:   Mobility:   Locomotion : Gait Ambulation: No Gait Gait: No Stairs / Additional Locomotion Stairs: No Wheelchair Mobility Wheelchair Mobility: No  Trunk/Postural Assessment : Cervical Assessment Cervical Assessment: Exceptions to [facility] (forward head) Thoracic Assessment Thoracic Assessment: Exceptions to [facility] (rounded shoulders) Lumbar  Assessment Lumbar Assessment: Exceptions to [facility] (posterior pelvic tilt) Postural Control Postural Control: Deficits on evaluation  Balance: Balance Balance Assessed: Yes Static Sitting Balance Static Sitting - Balance Support: Left upper extremity supported;Feet supported Static Sitting - Level of Assistance: 5: Stand by assistance Dynamic Sitting Balance Dynamic Sitting - Balance Support: Left upper extremity supported;Feet supported;During functional activity Dynamic Sitting - Level of Assistance: 4: Min assist Exercises:   Other Treatments:      Therapy/Group: Individual Therapy  Adams Hinch 06/05/2022, 1:04 PM

## 2022-06-06 DIAGNOSIS — A499 Bacterial infection, unspecified: Secondary | ICD-10-CM

## 2022-06-06 DIAGNOSIS — N390 Urinary tract infection, site not specified: Secondary | ICD-10-CM

## 2022-06-06 LAB — GLUCOSE, CAPILLARY
Glucose-Capillary: 165 mg/dL — ABNORMAL HIGH (ref 70–99)
Glucose-Capillary: 132 mg/dL — ABNORMAL HIGH (ref 70–99)
Glucose-Capillary: 101 mg/dL — ABNORMAL HIGH (ref 70–99)
Glucose-Capillary: 152 mg/dL — ABNORMAL HIGH (ref 70–99)

## 2022-06-06 MED ORDER — FLUOXETINE HCL 10 MG PO CAPS: 10.0000 mg | ORAL_CAPSULE | Freq: Every day | ORAL | 0 refills | Status: DC

## 2022-06-06 MED ORDER — ACETAMINOPHEN 325 MG PO TABS: 325.0000 mg | ORAL_TABLET | ORAL | Status: DC | PRN

## 2022-06-06 MED ORDER — SIMETHICONE 80 MG PO CHEW: 80.0000 mg | CHEWABLE_TABLET | Freq: Four times a day (QID) | ORAL | 0 refills | Status: DC | PRN

## 2022-06-06 MED ORDER — CYANOCOBALAMIN 1000 MCG PO TABS: 1000.0000 ug | ORAL_TABLET | Freq: Every day | ORAL | 0 refills | Status: DC

## 2022-06-06 MED ORDER — LIDOCAINE 5 % EX PTCH: 3.0000 | MEDICATED_PATCH | CUTANEOUS | 0 refills | Status: DC

## 2022-06-06 MED ORDER — CIPROFLOXACIN HCL 500 MG PO TABS: 500.0000 mg | ORAL_TABLET | Freq: Every day | ORAL | 0 refills | Status: AC

## 2022-06-06 MED ORDER — LEVEMIR FLEXTOUCH 100 UNIT/ML ~~LOC~~ SOPN: 10.0000 [IU] | PEN_INJECTOR | Freq: Every day | SUBCUTANEOUS | 3 refills | Status: DC

## 2022-06-06 MED ORDER — SENNOSIDES-DOCUSATE SODIUM 8.6-50 MG PO TABS: 2.0000 | ORAL_TABLET | Freq: Every evening | ORAL | Status: DC | PRN

## 2022-06-06 MED ORDER — FOLIC ACID 1 MG PO TABS: 1.0000 mg | ORAL_TABLET | Freq: Every day | ORAL | 0 refills | Status: DC

## 2022-06-06 MED ORDER — SODIUM BICARBONATE 650 MG PO TABS: 650.0000 mg | ORAL_TABLET | Freq: Two times a day (BID) | ORAL | 0 refills | Status: DC

## 2022-06-06 NOTE — Progress Notes (Signed)
PROGRESS NOTE   Subjective/Complaints:  No new complaints. Happy to be going home today. Continued urinary frequency. Left foot still sl tender. Right chest wall tender also  ROS: Patient denies fever, rash, sore throat, blurred vision, dizziness, nausea, vomiting, diarrhea, cough, shortness of breath or chest pain,   headache, or mood change.    Objective:   No results found. Recent Labs    06/03/22 1054 06/05/22 0711  WBC 14.5* 8.8  HGB 8.8* 8.9*  HCT 27.9* 27.0*  PLT 408* 368    Recent Labs    06/05/22 0711  NA 141  K 3.9  CL 107  CO2 23  GLUCOSE 114*  BUN 37*  CREATININE 2.64*  CALCIUM 8.7*    Intake/Output Summary (Last 24 hours) at 06/06/2022 0917 Last data filed at 06/05/2022 1308 Gross per 24 hour  Intake 117 ml  Output --  Net 117 ml        Physical Exam: Vital Signs Blood pressure 130/68, pulse 78, temperature 99.2 F (37.3 C), temperature source Oral, resp. rate 18, height _0  (1.626 m), weight 78.9 kg, SpO2 100 %.    Constitutional: No distress . Vital signs reviewed. HEENT: NCAT, EOMI, oral membranes moist Neck: supple Cardiovascular: RRR without murmur. No JVD    Respiratory/Chest: CTA Bilaterally without wheezes or rales. Normal effort    GI/Abdomen: BS +, non-tender, non-distended Ext: no clubbing, cyanosis, or edema Psych: pleasant and cooperative  Musculoskeletal:     Cervical back: Normal range of motion.     Comments: Right lower leg surgical dressing in place; right forearm splint in place with ACE wrap. RUE tender to minimal palpation and movement.  Can wiggle toes on RLE, grip with right hand- stable exam. Sl pain with palpation of dorsum of left foot/big toe. Did not palpate right chest wall Skin:    General: Skin is warm and dry Neurological:     Mental Status: She is alert and oriented to person, place, and time.     Comments: alert and Oriented to person, place,  reason she's here. Fair insight and awareness. OngoingSTM deficits. Some delays in processing. Normal speech, language. CN exam unremarkable. LUE 4/5. RUE limited by pain/ortho, good grip. Does move all muscle groups. LLE 2/5 prox to 4/5 distal. RLE tr-1/5 prox to 3/5 distal (wiggles toes within splint). Motor exam stable.  No focal sensory loss.     Assessment/Plan: 1. Functional deficits which require 3+ hours per day of interdisciplinary therapy in a comprehensive inpatient rehab setting. Physiatrist is providing close team supervision and 24 hour management of active medical problems listed below. Physiatrist and rehab team continue to assess barriers to discharge/monitor patient progress toward functional and medical goals  Care Tool:  Bathing    Body parts bathed by patient: Right arm, Left arm, Chest, Abdomen, Front perineal area, Right upper leg, Left upper leg, Face   Body parts bathed by helper: Buttocks, Left lower leg Body parts n/a: Right lower leg   Bathing assist Assist Level: Moderate Assistance - Patient 50 - 74%     Upper Body Dressing/Undressing Upper body dressing   What is the patient wearing?: Pull over shirt  Upper body assist Assist Level: Supervision/Verbal cueing    Lower Body Dressing/Undressing Lower body dressing      What is the patient wearing?: Incontinence brief, Pants     Lower body assist Assist for lower body dressing: Maximal Assistance - Patient 25 - 49%     Toileting Toileting    Toileting assist Assist for toileting: Maximal Assistance - Patient 25 - 49%     Transfers Chair/bed transfer  Transfers assist  Chair/bed transfer activity did not occur: Safety/medical concerns  Chair/bed transfer assist level: Maximal Assistance - Patient 25 - 49%     Locomotion Ambulation   Ambulation assist   Ambulation activity did not occur: Safety/medical concerns          Walk 10 feet activity   Assist  Walk 10 feet activity  did not occur: Safety/medical concerns        Walk 50 feet activity   Assist Walk 50 feet with 2 turns activity did not occur: Safety/medical concerns         Walk 150 feet activity   Assist Walk 150 feet activity did not occur: Safety/medical concerns         Walk 10 feet on uneven surface  activity   Assist Walk 10 feet on uneven surfaces activity did not occur: Safety/medical concerns         Wheelchair     Assist Is the patient using a wheelchair?: Yes Type of Wheelchair: Manual Wheelchair activity did not occur: Safety/medical concerns  Wheelchair assist level: Minimal Assistance - Patient > 75% Max wheelchair distance: 114f    Wheelchair 50 feet with 2 turns activity    Assist        Assist Level: Minimal Assistance - Patient > 75%   Wheelchair 150 feet activity     Assist      Assist Level: Minimal Assistance - Patient > 75%    Medical Problem List and Plan: 1. Functional deficits secondary to multiple trauma after ground level fall             -dc home today. Ortho, primary[facility]  [facility] Can see [facility] if needed 2.  Antithrombotics: -DVT/anticoagulation:  Pharmaceutical: Lovenox 30 mg daily due to renal impairment -venous dopplers appear normal (where able to test given RLE splint)             -antiplatelet therapy: none 3. Pain Management: Tylenol, tramadol, Robaxin prn  11/14- will add Norco 5/325 mg q6 hours prn for pain and monitor cognition- cannot increase tramadol due to renal impairment- will also add Lidoderm patches 3 patches 12 hrs on; 12 hrs off- 8pm-8am  11/15- slept better due to better pain control- con't regimen per nursing, didn't get loopy with meds  11/16- pain well controlled per pt  11/20- due to renal issues, stopped Ultracet TID- can only receive 2 doses/day  11/25 - L great toe pain with mild redness; on chart review Hx gout;  12/1 appears improved; added voltaren to toe  4. Mood/Behavior/Sleep:  LCSW to evaluate and provide emotional support  11/21- will add Prozac 10 mg daily             -antipsychotic agents: n/a 5. Neuropsych/cognition: This patient is not capable of making decisions on her own behalf. Consider SLP eval.  11/16- sent SLP eval 6. Skin/Wound Care: Routine skin care checks             -monitor surgical incision 7. Fluids/Electrolytes/Nutrition: Routine Is and Os  and follow-up chemistries             -ensure adequate PO hydration 8: Right tib-fib fracture s/p ORIF 11/2 -NWB RLE  -follow-up with Dr. Mardelle Matte 9: Right non-displaced wrist fracture: WBAT tolerated through elbow             -maintain wrist splint             -follow-up with Dr. 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   11/23- Cr up to 2.99 and BUN up to 47 from 32- will start IVFs NS 75cc/hour x 24 hours and recheck in AM- will d/w nursing to push PO fluids at all times.   11/30 Cr 2.64--looks like it's within her range. Needs f/u as outpt with renal, BUN actually better   -encourage fluids  12/1 f/u labs with primary 13: Hyperlipidemia: continue Lipitor 14: DM2: CBGs 4 times daily             -continue Levemir 10 units daily             -continue sliding scale             -consider restarting home insulin 4 units with meals  11/14- will monitor trend- is 91-223 in last 24 hours, but give 1 more day before adding insulin  11/27- CBG's looking good- con't regimen 15: Constipation: continue senna and Colace daily; prns  11/23- BM's yesterday and this AM  11/28. No bm's sinc 11/26 but they were substantial 11/29 large bm  16: Anemia; multi-factorial: follow-up hgb stable 17:  Leukocytosis/low grade fever: abx for pneumonia continue     11/28WBC's trending back up (14.5)---   -cxr with b/l pleural effusions  12/1 - UCX with 100k pseudomonas   -rx changed to cipro yesterday--continue for 5 days   19. Confusion  See above--should improve somewhat with rx of uti 20. Allergies  11/20- will write for pt's Zyrtec - brought from home. and stop Claritin 21. Depression  11/21-   Prozac 10 mg daily-         LOS: 18 days A FACE TO FACE EVALUATION WAS PERFORMED  Meredith Staggers 06/06/2022, 9:17 AM

## 2022-06-06 NOTE — Progress Notes (Signed)
Inpatient Rehabilitation Discharge Medication Review by a Pharmacist  A complete drug regimen review was completed for this patient to identify any potential clinically significant medication issues.  High Risk Drug Classes Is patient taking? 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 Vitamin B-12, folate, MVI - supplements Diclofenac gel - L toe pain Lidocaine patch - rib pain Cetirizine - allergies Sodium Bicarb - acid/base balance Azo-Cranberry - urinary tract health Procrit - anemia, as indicated per outpatient Nephrologist  PRN: Acetaminophen - mild pain Simethicone - flatulence Senokot S - laxative     Type of Medication Issue Identified Description of Issue Recommendation(s)  Drug Interaction(s) (clinically significant)     Duplicate Therapy     Allergy     No Medication Administration End Date     Incorrect Dose     Additional Drug Therapy Needed     Significant med changes from prior encounter (inform family/care partners about these prior to discharge).    Other       Clinically significant medication issues were identified that warrant physician communication and completion of prescribed/recommended actions by midnight of the next day:  No   Time spent performing this drug regimen review (minutes):  [address]  Arty Baumgartner, [place] 06/06/2022 5:40 PM

## 2022-06-06 NOTE — Progress Notes (Signed)
Patient ID: Miranda Roman, female   DOB: 1946-05-28, 76 y.o.   MRN: [id number]  SW spoke with pt dtr [name] to confirm if the hospital bed has been received. Reports it was delivered yesterday, but they would prefer a fully electric hospital bed, and will need an order. SW shared will follow-up with liaison with [facility], and indicated that it likely could not be swapped out today. She asked SW to follow-up with her brother in the room with patient.   SW informed [facility] about request. SW submitted new order.   SW met with pt and pt son [name] to discuss above. Pt excited about going home. SW encouraged follow-up if needed.   [name], MSW, [place] Office: [phone] Cell: [phone] Fax: [phone]

## 2022-06-06 NOTE — Consult Note (Signed)
Neuropsychological Consultation   Patient:   Miranda Roman   DOB:   1945/10/05  MR Number:  [id number]  Location:  [facility] [address] C[facility] [address] [place] [id number] [place] [address] Dept: [place]: [phone]           Date of Service:   06/06/2022  Start Time:   8 AM End Time:   9 AM  Provider/Observer:  Ilean Skill, Psy.D.       Clinical Neuropsychologist       Billing Code/Service: 96158/96159  Chief Complaint:    Miranda Roman is a 76 year old female referred for follow-up neuropsychological consultation due to recovering status orthopedic injury with baseline mild cognitive dysfunction.  I saw the patient on 05/26/2022 and this is a follow-up visit prior to discharge.  The patient was brought to the emergency department via EMS from her home after mechanical fall on 05/08/2022.  Patient with physical deformity to the right lower extremity and right wrist.  Patient had been using a rolling walker prior to fall.  She reportedly tripped over the family dog.  Patient with past medical history including diabetes, hypertension and chronic kidney disease stage IV.  Patient had a nondisplaced right distal ulnar/radius fracture as well as a right tib-fib fracture.  On 11/4 patient taken to operating room for ORIF with IM nailing fixation of right tib-fib fracture and closed management of right distal radius and ulnar fracture.  Patient developed acute kidney injury as well as fever with worsening leukocytosis on 11/9.  Patient started on IV antibiotics.  Patient is nonweightbearing in the right lower extremity.  Patient had previous neuropsychological assessment performed in July of this year by Dr. Melvyn Novas at the request of her neurologist Dr. Tomi Likens.  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  Patient's son was rather concerned feel like his mother was displaying increasing symptoms of depression with frustration over long hospital stay and inability to go home at this point.  Reason for Service:  Patient was referred for neuropsychological consultation due to underlying cognitive difficulties as well as coping and adjustment issues with extended hospital stay after mechanical fall with tib-fib fracture.  Patient had previously been diagnosed with mild cognitive disorder and followed by Dr. Tomi Likens with [facility] neurology.  Below is the HPI for the current admission.  HPI: Miranda Roman is a right-handed 76 year old female brought to the emergency department via EMS from home for mechanical fall on 05/08/2022.[name]  She had physical deformity to the right lower extremity and right wrist.  She had recently moved in with her daughter and typically ambulates with a rolling walker.  She tripped over the family dogs and was complaining of right wrist and right proximal lower leg pain.  Medical history is significant for diabetes mellitus, hypertension and chronic kidney disease stage IV.  Initial imaging revealed no spinal deformity.  Positive for nondisplaced right distal ulnar/radius fracture.  Dr. Greta Doom consulted and fracture is nonoperable and patient placed in splint.  She was admitted and orthopedic surgery consultation obtained.  Imaging revealed right tib-fib fracture and she was placed into a long-leg splint.  On 11/4, she was taken to the operating  room by Dr. Mardelle Matte and underwent ORIF with IM nail fixation of right tib-fib fracture and closed management of right distal radius and ulnar fracture.  She developed acute kidney injury with serum creatinine elevated to 3.5.  IVFs continued. Nephrology was consulted on 11/11.   She developed fever with worsening leukocytosis on 11/9.  Urine culture and chest x-ray ordered.  She complained of abdominal pain 11/10 and CT of abdomen pelvis performed. No acute abnormality. Fever worsened to 103.1 on 11/10 and she was tachycardic with increased respiratory rate and desaturating.  Blood cultures obtained.  Empirically started on IV Rocephin and IV azithromycin.  She is nonweightbearing in the right lower extremity and weightbearing as tolerated to the elbow of the right upper extremity.The patient requires inpatient physical medicine and rehabilitation evaluations and treatment secondary to dysfunction due to fall with RLE and RUE fractures.   Current Status:  Patient was awake and alert in the room laying in her bed with head slightly elevated.  Son was in the room at the time but left to go get breakfast for himself.  The patient was oriented to person place and time.  She did have ongoing slowed expressive language functions but was accurate with adequate word finding capacity.  Patient with bright mood and positive.  She remembered her fall and was able to describe multiple issues.  She was rather unclear but aware of her pneumonia and was rather fixated on chest pain during her visit today.  Patient knew she was being discharged today and was looking forward to it.  Son was here for her discharge.  Patient appears to be approaching baseline cognitive functioning consistent with results of recent neuropsychological evaluation done in July.  Behavioral Observation: Miranda Roman  presents as a 76 y.o.-year-old Right handed African American Female who appeared her stated age. her dress was Appropriate and she was Well Groomed and her manners were Appropriate to the situation.  her participation was indicative of Appropriate and Redirectable behaviors.  There were physical disabilities noted.  she displayed an appropriate level of cooperation and motivation.     Interactions:     Active Appropriate  Attention:   abnormal and attention span appeared shorter than expected for age  Memory:   abnormal; remote memory intact, recent memory impaired  Visuo-spatial:  not examined  Speech (Volume):  low  Speech:   normal; slowed responses  Thought Process:  Coherent and Relevant  Though Content:  WNL; not suicidal and not homicidal  Orientation:   person, place, time/date, and situation  Judgment:   Fair  Planning:   Fair  Affect:    Appropriate  Mood:    Euthymic  Insight:   Fair  Intelligence:   very high   Medical History:   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   Borderline steroid-induced glaucoma, 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   OD, Early elevated intraocular pressure to 28 mm we will discontinue all topical medications from surgery in the  right eye today   Bronchiectasis without complication 16/04/9603   Cataract 07/03/2020   Chest pain at rest 01/30/2015   Chronic kidney disease, stage 4 (severe) 12/07/2020   Closed fracture of right tibial plateau 07/17/2021   Cyst of left kidney 03/10/2019   Needs repeated US 08/2019   Dysphagia, neurologic    Elevated d-dimer 12/20/2019   Facet arthritis of lumbar region 02/27/2019   Gastroesophageal reflux disease 08/26/2016   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  Further history patient suffered possible Wernicke's encephalopathy the past, a type of the vitamin deficiencies, thiamine that can also affect the retina if there is significant damage which may not be recoverable.   Mild neurocognitive disorder due to multiple etiologies 01/23/2022   Osteoporosis 12/15/2018   Overweight with body mass index (BMI) 25.0-29.9 12/15/2018   PAD (peripheral artery disease) 06/06/2019   Pain in right hand 04/27/2020   Pancreatic cyst 06/06/2019   Polyneuropathy due to type 2 diabetes mellitus    Posterior vitreous detachment of left eye 08/05/2021   Proliferative diabetic retinopathy of right eye 08/22/2021   Likely contributory cause of nonclearing vitreous hemorrhage in the right eye now status post PRP likely to resolve in 2 quiescent PDR, post vitrectomy PRP   Pulmonary hypertension 02/04/2021   Right knee pain 07/10/2021   Sickle-cell trait 12/07/2020   Type II diabetes mellitus 03/10/2012   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   Vitreous hemorrhage of right eye 08/05/2021   Vitrectomy PRP 08-14-2021         Patient Active Problem List   Diagnosis Date Noted   Mild cognitive disorder 05/26/2022   Right tibial fracture 05/19/2022   Critical polytrauma 05/19/2022   Postoperative anemia due to acute blood loss 05/17/2022   PNA (pneumonia) 05/16/2022   Acute renal failure superimposed on stage 4 chronic kidney disease ([place]) 05/14/2022   Hypertension 05/14/2022   Fracture 05/08/2022   Tibia/fibula fracture, right, closed, initial encounter 05/08/2022   Right  radial fracture 05/08/2022   Right distal ulnar fracture 05/08/2022   Left wrist pain 05/08/2022   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   Mild neurocognitive disorder due to multiple etiologies 01/23/2022   Polyneuropathy due to type 2 diabetes mellitus    Anemia of chronic renal failure, stage 4 (severe) 01/04/2022   Proliferative diabetic retinopathy of right eye 08/22/2021   Borderline steroid-induced glaucoma, 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   Macular atrophy, retinal 08/22/2021   Posterior vitreous detachment of left eye 08/05/2021   Vitreous hemorrhage of right eye 08/05/2021   Right knee pain 07/10/2021   Pulmonary hypertension 02/04/2021   Bronchiectasis without complication 54/03/8118   Chronic kidney disease, stage 4 (severe) 12/07/2020   Hyperglycemia due to type 2 diabetes mellitus 12/07/2020   Long term (current) use of insulin 12/07/2020   Sickle-cell trait 12/07/2020   Cataract 07/03/2020   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   Pain in right hand 04/27/2020   Dysphagia, neurologic    Inadequate oral nutritional intake    Altered mental  status    Leukocytosis 12/22/2019   Elevated d-dimer 12/20/2019   PAD (peripheral artery disease) 06/06/2019   Pancreatic cyst 06/06/2019   Cyst of left kidney 03/10/2019   Lumbago 02/27/2019   Facet arthritis of lumbar region 02/27/2019   Abnormal CT of the chest 12/28/2018   Atrophic vaginitis 12/15/2018   Atypical squamous cells of undetermined significance on cytologic smear of cervix (ASC-US) 12/15/2018   Overweight with body mass index (BMI) 25.0-29.9 12/15/2018   Low grade squamous intraepithelial lesion (LGSIL) on cervicovaginal cytologic smear 12/15/2018   Osteoporosis 12/15/2018   Unspecified dyspareunia (CODE) 12/15/2018   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   Hypertensive nephropathy 10/15/2017   Gastroesophageal reflux disease 08/26/2016   Aortic valve regurgitation 02/11/2016   Chest pain at rest 01/30/2015   Atherosclerosis of native  coronary artery of native heart without angina pectoris 12/24/2012   Benign essential hypertension 12/24/2012   Hyperlipidemia 12/24/2012   Type II diabetes mellitus 03/10/2012   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    Psychiatric History:  No prior psychiatric history noted  Family Med/Psych History:  Family History  Problem Relation Age of Onset   Diabetes Mother    Heart disease Mother    Hypertension Mother    Stroke Mother    Diabetes Father    Heart disease Father    Diabetes Sister    Diabetes Brother    Hypertension Brother    Sickle cell anemia Brother    Diabetes Maternal Aunt    Diabetes Maternal Uncle    Diabetes Maternal Grandmother    Diabetes Maternal Grandfather    Sickle cell anemia Sister    Sickle cell anemia Sister    Healthy Son    Healthy Son    Healthy Daughter     Impression/DX:  Miranda Roman is a 76 year old female referred for neuropsychological consultation due to recovering status orthopedic injury with baseline mild cognitive dysfunction.  The patient was brought to the emergency department via EMS from her home after mechanical fall on 05/08/2022.  Patient with physical deformity to the right lower extremity and right wrist.  Patient had been using a rolling walker prior to fall.  She reportedly tripped over the family dog.  Patient with past medical history including diabetes, hypertension and chronic kidney disease stage IV.  Patient had a nondisplaced right distal ulnar/radius fracture as well as a right tib-fib fracture.  On 11/4 patient taken to operating room for ORIF with IM nailing fixation of right tib-fib fracture and closed management of right distal radius and ulnar fracture.  Patient developed acute kidney injury as well as fever with worsening leukocytosis on 11/9.  Patient started on IV antibiotics.  Patient is nonweightbearing in the right lower extremity.  Patient had previous neuropsychological assessment performed in July of this  year by Dr. Melvyn Novas at the request of her neurologist Dr. Tomi Likens.  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  Patient's son was rather concerned feel like his mother was displaying increasing symptoms of depression with frustration over long hospital stay and inability to go home at this point.  Patient was awake and alert in the room laying in her bed with head slightly elevated.  Son was in the room  at the time but left to go get breakfast for himself.  The patient was oriented to person place and time.  She did have ongoing slowed expressive language functions but was accurate with adequate word finding capacity.  Patient with bright mood and positive.  She remembered her fall and was able to describe multiple issues.  She was rather unclear but aware of her pneumonia and was rather fixated on chest pain during her visit today.  Patient knew she was being discharged today and was looking forward to it.  Son was here for her discharge.  Patient appears to be approaching baseline cognitive functioning consistent with results of recent neuropsychological evaluation done in July.  Disposition/Plan:  Patient is being discharged today and is going back to her daughter's house with son visiting from [place] initially.  Patient continues with some pain but most of it is described as in her chest related to her pneumonia infection.  Patient is oriented with slowed information processing speed consistent with some microvascular ischemic changes.  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         Electronically  Signed   _______________________ Ilean Skill, Psy.D. Clinical Neuropsychologist

## 2022-06-06 NOTE — Progress Notes (Signed)
Inpatient Rehabilitation Care Coordinator Discharge Note   Patient Details  Name: Miranda Roman MRN: [id number] Date of Birth: 1945-07-11   Discharge location: D/c to her daughter's home  Length of Stay: 17 days  Discharge activity level: discharge at a wheelchair level Max Assist  Home/community participation: Limited  Patient response EZ:MOQHUT Literacy - How often do you need to have someone help you when you read instructions, pamphlets, or other written material from your doctor or pharmacy?: Never  Patient response ML:YYTKPT Isolation - How often do you feel lonely or isolated from those around you?: Never  Services provided included: MD, RD, PT, OT, SLP, RN, Neuropsych, SW, Pharmacy, TR, CM  Financial Services:  Charity fundraiser Utilized: [employer]  Choices offered to/list presented to: Yes  Follow-up services arranged:  Outpatient, DME    Outpatient Servicies: [facility] for HHPT/OT/SLP DME : [employer] for bilateral platform attachments, elevating leg rests, transfer board, and fully electric hospital bed    Patient response to transportation need: Is the patient able to respond to transportation needs?: Yes In the past 12 months, has lack of transportation kept you from medical appointments or from getting medications?: No In the past 12 months, has lack of transportation kept you from meetings, work, or from getting things needed for daily living?: No    Comments (or additional information)  Patient/Family verbalized understanding of follow-up arrangements:  Yes  Individual responsible for coordination of the follow-up plan: contact pt dtr Miranda Roman  Confirmed correct DME delivered: Miranda Roman 06/06/2022    Miranda Roman

## 2022-06-09 ENCOUNTER — Telehealth: Payer: Self-pay

## 2022-06-09 DIAGNOSIS — E1122 Type 2 diabetes mellitus with diabetic chronic kidney disease: Secondary | ICD-10-CM | POA: Diagnosis not present

## 2022-06-09 DIAGNOSIS — I129 Hypertensive chronic kidney disease with stage 1 through stage 4 chronic kidney disease, or unspecified chronic kidney disease: Secondary | ICD-10-CM | POA: Diagnosis not present

## 2022-06-09 DIAGNOSIS — D62 Acute posthemorrhagic anemia: Secondary | ICD-10-CM | POA: Diagnosis not present

## 2022-06-09 DIAGNOSIS — S52501D Unspecified fracture of the lower end of right radius, subsequent encounter for closed fracture with routine healing: Secondary | ICD-10-CM | POA: Diagnosis not present

## 2022-06-09 DIAGNOSIS — N184 Chronic kidney disease, stage 4 (severe): Secondary | ICD-10-CM | POA: Diagnosis not present

## 2022-06-09 DIAGNOSIS — Z794 Long term (current) use of insulin: Secondary | ICD-10-CM | POA: Diagnosis not present

## 2022-06-09 DIAGNOSIS — S52201D Unspecified fracture of shaft of right ulna, subsequent encounter for closed fracture with routine healing: Secondary | ICD-10-CM | POA: Diagnosis not present

## 2022-06-09 DIAGNOSIS — S82251D Displaced comminuted fracture of shaft of right tibia, subsequent encounter for closed fracture with routine healing: Secondary | ICD-10-CM | POA: Diagnosis not present

## 2022-06-09 NOTE — Telephone Encounter (Signed)
Transition Care Management Unsuccessful Follow-up Telephone Call  Date of discharge and from where:  06/06/2022   Attempts:  1st Attempt  Reason for unsuccessful TCM follow-up call:  Left voice message

## 2022-06-10 ENCOUNTER — Ambulatory Visit (INDEPENDENT_AMBULATORY_CARE_PROVIDER_SITE_OTHER): Payer: Medicare Other | Admitting: Internal Medicine

## 2022-06-10 VITALS — BP 128/68 | HR 85 | Temp 98.3°F | Ht 64.0 in | Wt 173.0 lb

## 2022-06-10 DIAGNOSIS — Z0000 Encounter for general adult medical examination without abnormal findings: Secondary | ICD-10-CM

## 2022-06-10 DIAGNOSIS — N184 Chronic kidney disease, stage 4 (severe): Secondary | ICD-10-CM

## 2022-06-10 DIAGNOSIS — Z23 Encounter for immunization: Secondary | ICD-10-CM

## 2022-06-10 DIAGNOSIS — E1122 Type 2 diabetes mellitus with diabetic chronic kidney disease: Secondary | ICD-10-CM

## 2022-06-10 DIAGNOSIS — I129 Hypertensive chronic kidney disease with stage 1 through stage 4 chronic kidney disease, or unspecified chronic kidney disease: Secondary | ICD-10-CM

## 2022-06-10 DIAGNOSIS — S82201S Unspecified fracture of shaft of right tibia, sequela: Secondary | ICD-10-CM | POA: Diagnosis not present

## 2022-06-10 NOTE — Progress Notes (Signed)
Barnet Glasgow Martin,acting as a Education administrator for [name], MD.,have documented all relevant documentation on the behalf of [name], MD,as directed by  [name], MD while in the presence of [name], MD.   Subjective:     Patient ID: Miranda Roman , female    DOB: 05-13-1946 , 76 y.o.   MRN: [id number]   Chief Complaint  Patient presents with   Annual Exam   Diabetes   Hypertension    HPI  She is here today for a full physical examination.  She is accompanied by her daughter, Miranda Roman today. She is followed by [facility] for her GYN care. She reports compliance with meds. Denies headaches, chest pain and shortness of breath.   Unfortunately, since her last visit she was hospitalized due to a fall. She was hospitalized on 05/08/22, transferred to rehab and then discharged back home on 06/06/22. She presented to ER after a fall that occurred in her daughter's home. She had just moved in when she was tripped by 2 dogs and fell to the ground. She did not hit her head or lose consciousness. She normally ambulates with walker but was not doing so at the time. She presented with left wrist pain. ER eval revealed acute mildly to moderately commuted and displaced fracture of the proximal tibial metadiaphysis.  Also, an acutely mildly displaced and mildly impacted fracture of the proximal fibular metaphysis was noted. She was seen in consultation by orthopedics and patient underwent right proximal tibia fracture open plating with IM nail fixation, closed management right distal radius and ulna fracture per orthopedics, Dr. Mardelle Matte 05/10/2022.  She also suffered a right wrist fracture of the distal radial metaphysis with mild volar apex angulation.  Acute nondisplaced fracture of the distal ulnar metaphysis. Hand Surgery evaluated her and this was deemed inoperable. She was seen by PT/OT who recommended inpatient rehab. She was discharged to rehab on 05/19/22.       Hypertension This is a  chronic problem. The current episode started more than 1 year ago. The problem has been gradually improving since onset. Pertinent negatives include no chest pain, palpitations or shortness of breath. The current treatment provides moderate improvement.  Diabetes She presents for her follow-up diabetic visit. She has type 2 diabetes mellitus. Her disease course has been stable. There are no hypoglycemic associated symptoms. There are no diabetic associated symptoms. Pertinent negatives for diabetes include no chest pain. There are no hypoglycemic complications. Diabetic complications include nephropathy. Risk factors for coronary artery disease include diabetes mellitus, dyslipidemia, hypertension, sedentary lifestyle and post-menopausal. Current diabetic treatment includes insulin injections. She is compliant with treatment most of the time. She is following a diabetic diet. Meal planning includes avoidance of concentrated sweets.     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   Borderline steroid-induced glaucoma, 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   OD, Early elevated intraocular pressure to 28 mm we will discontinue all topical medications from surgery in the  right eye today   Bronchiectasis without complication 40/76/8088   Cataract 07/03/2020   Chest pain at rest 01/30/2015   Chronic kidney disease, stage 4 (severe) 12/07/2020   Closed fracture of right tibial plateau  07/17/2021   Cyst of left kidney 03/10/2019   Needs repeated US 08/2019   Dysphagia, neurologic    Elevated d-dimer 12/20/2019   Facet arthritis of lumbar region 02/27/2019   Gastroesophageal reflux disease 08/26/2016   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  Further history patient suffered possible Wernicke's encephalopathy the past, a type of the vitamin deficiencies, thiamine that can also affect the retina if there is significant damage which may not be recoverable.   Mild neurocognitive disorder due to multiple etiologies 01/23/2022   Osteoporosis 12/15/2018   Overweight with body mass index (BMI) 25.0-29.9 12/15/2018   PAD (peripheral artery disease) 06/06/2019   Pain in right hand 04/27/2020   Pancreatic cyst 06/06/2019   Polyneuropathy due to type 2 diabetes mellitus    Posterior vitreous detachment of left eye 08/05/2021   Proliferative diabetic retinopathy of right eye 08/22/2021   Likely contributory cause of nonclearing vitreous hemorrhage in the right eye now status post PRP likely to resolve in 2 quiescent PDR, post vitrectomy PRP   Pulmonary hypertension 02/04/2021   Right knee pain 07/10/2021   Sickle-cell trait 12/07/2020   Type II diabetes mellitus 03/10/2012   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   Vitreous hemorrhage of right eye 08/05/2021   Vitrectomy PRP 08-14-2021     Family History  Problem Relation Age of Onset   Diabetes Mother    Heart disease Mother     Hypertension Mother    Stroke Mother    Diabetes Father    Heart disease Father    Diabetes Sister    Diabetes Brother    Hypertension Brother    Sickle cell anemia Brother    Diabetes Maternal Aunt    Diabetes Maternal Uncle    Diabetes Maternal Grandmother    Diabetes Maternal Grandfather    Sickle cell anemia Sister    Sickle cell anemia Sister    Healthy Son    Healthy Son    Healthy Daughter      Current Outpatient Medications:    acetaminophen (TYLENOL) 325 MG tablet, Take 1-2 tablets (325-650 mg total) by mouth every 4 (four) hours as needed for mild pain., Disp: , Rfl:    amLODipine (NORVASC) 2.5 MG tablet, Take 2.5 mg by mouth daily., Disp: , Rfl:    atorvastatin (LIPITOR) 20 MG tablet, TAKE ONE TABLET BY MOUTH MONDAY THRU FRIDAY AND SKIP WEEKENDS (Patient taking differently: Take 20 mg by mouth daily.), Disp: 65 tablet, Rfl: 2   AZO-CRANBERRY PO, Take 1 capsule by mouth daily at 6 (six) AM., Disp: , Rfl:    BD INSULIN SYRINGE U/F 31G X 5/16" 0.3 ML MISC, See admin instructions. use with insulin, Disp: , Rfl:    calcitRIOL (ROCALTROL) 0.25 MCG capsule, Take 0.25 mcg by mouth daily.,  Disp: , Rfl:    cetirizine (ZYRTEC) 10 MG tablet, Take 10 mg by mouth daily., Disp: , Rfl:    cyanocobalamin 1000 MCG tablet, Take 1 tablet (1,000 mcg total) by mouth daily., Disp: 30 tablet, Rfl: 0   diclofenac Sodium (VOLTAREN) 1 % GEL, Apply 2 g topically 4 (four) times daily. Apply to left knee. (Patient taking differently: Apply 2 g topically 4 (four) times daily. Apply to left knee), Disp: 50 g, Rfl: 0   FLUoxetine (PROZAC) 10 MG capsule, Take 1 capsule (10 mg total) by mouth daily., Disp: 30 capsule, Rfl: 0   folic acid (FOLVITE) 1 MG tablet, Take 1 tablet (1 mg total) by mouth daily., Disp: 30 tablet, Rfl: 0   gabapentin (NEURONTIN) 100 MG capsule, TAKE 1 CAPSULE BY MOUTH AT BEDTIME (Patient taking differently: Take 100 mg by mouth at bedtime.), Disp: 90 capsule, Rfl: 2   insulin detemir  (LEVEMIR FLEXTOUCH) 100 UNIT/ML FlexPen, Inject 10 Units into the skin daily., Disp: 15 mL, Rfl: 3   lidocaine (LIDODERM) 5 %, Place 3 patches onto the skin daily. Remove & Discard patch within 12 hours or as directed by MD, Disp: 30 patch, Rfl: 0   metoprolol tartrate (LOPRESSOR) 50 MG tablet, TAKE 1 TABLET BY MOUTH TWICE A DAY (Patient taking differently: Take 50 mg by mouth 2 (two) times daily.), Disp: 180 tablet, Rfl: 1   Multiple Vitamins-Minerals (CENTRUM SILVER PO), Take by mouth daily., Disp: , Rfl:    OneTouch Delica Lancets 40J MISC, See admin instructions., Disp: , Rfl:    senna-docusate (SENOKOT-S) 8.6-50 MG tablet, Take 2 tablets by mouth at bedtime as needed for mild constipation., Disp: , Rfl:    simethicone (MYLICON) 80 MG chewable tablet, Chew 1 tablet (80 mg total) by mouth every 6 (six) hours as needed for flatulence., Disp: 30 tablet, Rfl: 0   sodium bicarbonate 650 MG tablet, Take 1 tablet (650 mg total) by mouth 2 (two) times daily., Disp: 60 tablet, Rfl: 0   epoetin alfa (PROCRIT) 81191 UNIT/ML injection, Every other week (Patient not taking: Reported on 05/08/2022), Disp: , Rfl:    Insulin Pen Needle (BD PEN NEEDLE NANO U/F) 32G X 4 MM MISC, Use as directed with insulin pen, Disp: 300 each, Rfl: 2 No current facility-administered medications for this visit.  Facility-Administered Medications Ordered in Other Visits:    epoetin alfa-epbx (RETACRIT) injection 20,000 Units, 20,000 Units, Subcutaneous, Q28 days, Claudia Desanctis, MD, 20,000 Units at 07/03/22 1238   Allergies  Allergen Reactions   Cefepime Other (See Comments)    Causes encephalitis - reported by [facility] 12/31/2019   Lactose Intolerance (Gi)     Per pt's daughter causes gas      The patient states she uses post menopausal status for birth control. Last LMP was No LMP recorded. Patient is postmenopausal.. Negative for Dysmenorrhea. Negative for: breast discharge, breast lump(s), breast pain and breast  self exam. Associated symptoms include abnormal vaginal bleeding. Pertinent negatives include abnormal bleeding (hematology), anxiety, decreased libido, depression, difficulty falling sleep, dyspareunia, history of infertility, nocturia, sexual dysfunction, sleep disturbances, urinary incontinence, urinary urgency, vaginal discharge and vaginal itching. Diet regular.    . The patient's tobacco use is:  Social History   Tobacco Use  Smoking Status Former   Packs/day: 0.25   Years: 43.00   Total pack years: 10.75   Types: Cigarettes   Start date: 04/1962   Quit date: 04/2005   Years since quitting: 17.2  Smokeless  Tobacco Never  . She has been exposed to passive smoke. The patient's alcohol use is:  Social History   Substance and Sexual Activity  Alcohol Use Not Currently   Comment: occasionally    Review of Systems  Constitutional: Negative.   HENT: Negative.    Eyes: Negative.   Respiratory: Negative.  Negative for shortness of breath.   Cardiovascular: Negative.  Negative for chest pain and palpitations.  Gastrointestinal: Negative.      Today's Vitals   06/10/22 1548  BP: 128/68  Pulse: 85  Temp: 98.3 F (36.8 C)  TempSrc: Oral  Weight: 173 lb (78.5 kg)  Height: 5' 4" (1.626 m)  PainSc: 0-No pain   Body mass index is 29.7 kg/m.   Objective:  Physical Exam Vitals and nursing note reviewed.  Constitutional:      Appearance: Normal appearance.     Comments: Seated in wheelchair  HENT:     Head: Normocephalic and atraumatic.     Right Ear: Tympanic membrane, ear canal and external ear normal.     Left Ear: Tympanic membrane, ear canal and external ear normal.     Nose:     Comments: Masked     Mouth/Throat:     Comments: Masked  Eyes:     Extraocular Movements: Extraocular movements intact.     Conjunctiva/sclera: Conjunctivae normal.     Pupils: Pupils are equal, round, and reactive to light.  Cardiovascular:     Rate and Rhythm: Normal rate and regular  rhythm.     Pulses: Normal pulses.          Dorsalis pedis pulses are 2+ on the left side.     Heart sounds: Normal heart sounds.  Pulmonary:     Effort: Pulmonary effort is normal.     Breath sounds: Normal breath sounds.  Chest:  Breasts:    Tanner Score is 5.  Abdominal:     General: Abdomen is flat. Bowel sounds are normal.     Palpations: Abdomen is soft.  Genitourinary:    Comments: deferred Musculoskeletal:        General: Normal range of motion.     Cervical back: Normal range of motion and neck supple.     Comments: Right wrist/right LE bandaged  Feet:     Left foot:     Protective Sensation: 5 sites tested.  5 sites sensed.     Skin integrity: Dry skin present.     Toenail Condition: Left toenails are abnormally thick.  Skin:    General: Skin is warm and dry.  Neurological:     General: No focal deficit present.     Mental Status: She is alert and oriented to person, place, and time.  Psychiatric:        Mood and Affect: Mood normal.        Behavior: Behavior normal.         Assessment And Plan:     1. Annual physical exam Comments: A full exam was performed. Importance of monthly self breast exams was discussed with the patient. PATIENT IS ADVISED TO GET 30-45 MINUTES REGULAR EXERCISE NO LESS THAN FOUR TO FIVE DAYS PER WEEK - BOTH WEIGHTBEARING EXERCISES AND AEROBIC ARE RECOMMENDED.  PATIENT IS ADVISED TO FOLLOW A HEALTHY DIET WITH AT LEAST SIX FRUITS/VEGGIES PER DAY, DECREASE INTAKE OF RED MEAT, AND TO INCREASE FISH INTAKE TO TWO DAYS PER WEEK.  MEATS/FISH SHOULD NOT BE FRIED, BAKED OR BROILED IS PREFERABLE.  IT IS  ALSO IMPORTANT TO CUT BACK ON YOUR SUGAR INTAKE. PLEASE AVOID ANYTHING WITH ADDED SUGAR, CORN SYRUP OR OTHER SWEETENERS. IF YOU MUST USE A SWEETENER, YOU CAN TRY STEVIA. IT IS ALSO IMPORTANT TO AVOID ARTIFICIALLY SWEETENERS AND DIET BEVERAGES. LASTLY, I SUGGEST WEARING SPF 50 SUNSCREEN ON EXPOSED PARTS AND ESPECIALLY WHEN IN THE DIRECT SUNLIGHT FOR AN  EXTENDED PERIOD OF TIME.  PLEASE AVOID FAST FOOD RESTAURANTS AND INCREASE YOUR WATER INTAKE. - Lipid panel  2. Hypertensive nephropathy Comments: Chronic, well controlled. EKG not performed, due to recent hospitalization. No need for med changes. Encouraged to follow low sodium diet. - CMP14+EGFR  3. 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) Comments: Chronic, diabetic foot exam was performed. Reminded to avoid NSAIDs, stay well hydrated and keep BP/BS controlled to decrease risk of CKD progression. For now, she will f/u in 4 months. However, if accepted by [facility], she will have all f/u visits with them. I DISCUSSED WITH THE PATIENT AT LENGTH REGARDING THE GOALS OF GLYCEMIC CONTROL AND POSSIBLE LONG-TERM COMPLICATIONS.  I  ALSO STRESSED THE IMPORTANCE OF COMPLIANCE WITH HOME GLUCOSE MONITORING, DIETARY RESTRICTIONS INCLUDING AVOIDANCE OF SUGARY DRINKS/PROCESSED FOODS,  ALONG WITH REGULAR EXERCISE.  I  ALSO STRESSED THE IMPORTANCE OF ANNUAL EYE EXAMS, SELF FOOT CARE AND COMPLIANCE WITH OFFICE VISITS.  - Hemoglobin A1c - Fructosamine  4. Closed fracture of shaft of right tibia, unspecified fracture morphology, sequela Comments: Daughter had difficulty gettng pt here today. I suggested use of [facility]  for home visits, she agrees. TCM unable to be billed, no response to TCM phone call. DISCHARGE SUMMARY WAS REVIEWED IN FULL DETAIL DURING THE VISIT. MEDS RECONCILED AND COMPARED TO DISCHARGE MEDS. MEDICATION LIST WAS UPDATED AND REVIEWED WITH THE PATIENT. GREATER THAN 50% FACE TO FACE TIME WAS SPENT IN COUNSELING AND COORDINATION OF CARE. ALL QUESTIONS WERE ANSWERED TO THE SATISFACTION OF THE PATIENT.  She agrees to keep close f/u with Ortho.   5. Need for influenza vaccination - Flu Vaccine QUAD High Dose(Fluad)  Patient was given opportunity to ask questions. Patient verbalized understanding of the plan and was able to repeat key elements of the plan. All questions were answered to  their satisfaction.   I, [name], MD, have reviewed all documentation for this visit. The documentation on 07/03/22 for the exam, diagnosis, procedures, and orders are all accurate and complete.   THE PATIENT IS ENCOURAGED TO PRACTICE SOCIAL DISTANCING DUE TO THE COVID-19 PANDEMIC.

## 2022-06-10 NOTE — Patient Instructions (Signed)

## 2022-06-11 DIAGNOSIS — S52501A Unspecified fracture of the lower end of right radius, initial encounter for closed fracture: Secondary | ICD-10-CM | POA: Diagnosis not present

## 2022-06-11 DIAGNOSIS — M25531 Pain in right wrist: Secondary | ICD-10-CM | POA: Diagnosis not present

## 2022-06-11 LAB — CMP14+EGFR
ALT: 26 IU/L (ref 0–32)
AST: 24 IU/L (ref 0–40)
Albumin/Globulin Ratio: 1.1 — ABNORMAL LOW (ref 1.2–2.2)
Albumin: 3.4 g/dL — ABNORMAL LOW (ref 3.8–4.8)
Alkaline Phosphatase: 122 IU/L — ABNORMAL HIGH (ref 44–121)
BUN/Creatinine Ratio: 17 (ref 12–28)
BUN: 42 mg/dL — ABNORMAL HIGH (ref 8–27)
Bilirubin Total: <0.2 mg/dL (ref 0.0–1.2)
CO2: 20 mmol/L (ref 20–29)
Calcium: 8.8 mg/dL (ref 8.7–10.3)
Chloride: 105 mmol/L (ref 96–106)
Creatinine, Ser: 2.44 mg/dL — ABNORMAL HIGH (ref 0.57–1.00)
Globulin, Total: 3 g/dL (ref 1.5–4.5)
Glucose: 153 mg/dL — ABNORMAL HIGH (ref 70–99)
Potassium: 5.1 mmol/L (ref 3.5–5.2)
Sodium: 139 mmol/L (ref 134–144)
Total Protein: 6.4 g/dL (ref 6.0–8.5)
eGFR: 20 mL/min/{1.73_m2} — ABNORMAL LOW (ref >59)

## 2022-06-11 LAB — LIPID PANEL
Chol/HDL Ratio: 3.4 ratio (ref 0.0–4.4)
Cholesterol, Total: 124 mg/dL (ref 100–199)
HDL: 37 mg/dL — ABNORMAL LOW (ref >39)
LDL Chol Calc (NIH): 62 mg/dL (ref 0–99)
Triglycerides: 141 mg/dL (ref 0–149)
VLDL Cholesterol Cal: 25 mg/dL (ref 5–40)

## 2022-06-11 LAB — HEMOGLOBIN A1C
Est. average glucose Bld gHb Est-mCnc: 123 mg/dL
Hgb A1c MFr Bld: 5.9 % — ABNORMAL HIGH (ref 4.8–5.6)

## 2022-06-11 LAB — FRUCTOSAMINE: Fructosamine: 263 umol/L (ref 0–285)

## 2022-06-12 DIAGNOSIS — N184 Chronic kidney disease, stage 4 (severe): Secondary | ICD-10-CM | POA: Diagnosis not present

## 2022-06-12 DIAGNOSIS — S52501D Unspecified fracture of the lower end of right radius, subsequent encounter for closed fracture with routine healing: Secondary | ICD-10-CM | POA: Diagnosis not present

## 2022-06-12 DIAGNOSIS — S52201D Unspecified fracture of shaft of right ulna, subsequent encounter for closed fracture with routine healing: Secondary | ICD-10-CM | POA: Diagnosis not present

## 2022-06-12 DIAGNOSIS — I129 Hypertensive chronic kidney disease with stage 1 through stage 4 chronic kidney disease, or unspecified chronic kidney disease: Secondary | ICD-10-CM | POA: Diagnosis not present

## 2022-06-12 DIAGNOSIS — D62 Acute posthemorrhagic anemia: Secondary | ICD-10-CM | POA: Diagnosis not present

## 2022-06-12 DIAGNOSIS — E1122 Type 2 diabetes mellitus with diabetic chronic kidney disease: Secondary | ICD-10-CM | POA: Diagnosis not present

## 2022-06-12 DIAGNOSIS — S82251D Displaced comminuted fracture of shaft of right tibia, subsequent encounter for closed fracture with routine healing: Secondary | ICD-10-CM | POA: Diagnosis not present

## 2022-06-12 DIAGNOSIS — Z794 Long term (current) use of insulin: Secondary | ICD-10-CM | POA: Diagnosis not present

## 2022-06-13 DIAGNOSIS — S52201D Unspecified fracture of shaft of right ulna, subsequent encounter for closed fracture with routine healing: Secondary | ICD-10-CM | POA: Diagnosis not present

## 2022-06-13 DIAGNOSIS — I129 Hypertensive chronic kidney disease with stage 1 through stage 4 chronic kidney disease, or unspecified chronic kidney disease: Secondary | ICD-10-CM | POA: Diagnosis not present

## 2022-06-13 DIAGNOSIS — D62 Acute posthemorrhagic anemia: Secondary | ICD-10-CM | POA: Diagnosis not present

## 2022-06-13 DIAGNOSIS — Z794 Long term (current) use of insulin: Secondary | ICD-10-CM | POA: Diagnosis not present

## 2022-06-13 DIAGNOSIS — S52501D Unspecified fracture of the lower end of right radius, subsequent encounter for closed fracture with routine healing: Secondary | ICD-10-CM | POA: Diagnosis not present

## 2022-06-13 DIAGNOSIS — E1122 Type 2 diabetes mellitus with diabetic chronic kidney disease: Secondary | ICD-10-CM | POA: Diagnosis not present

## 2022-06-13 DIAGNOSIS — S82251D Displaced comminuted fracture of shaft of right tibia, subsequent encounter for closed fracture with routine healing: Secondary | ICD-10-CM | POA: Diagnosis not present

## 2022-06-13 DIAGNOSIS — N184 Chronic kidney disease, stage 4 (severe): Secondary | ICD-10-CM | POA: Diagnosis not present

## 2022-06-16 ENCOUNTER — Encounter (HOSPITAL_COMMUNITY): Payer: Medicare Other

## 2022-06-16 DIAGNOSIS — E1122 Type 2 diabetes mellitus with diabetic chronic kidney disease: Secondary | ICD-10-CM | POA: Diagnosis not present

## 2022-06-16 DIAGNOSIS — S52501D Unspecified fracture of the lower end of right radius, subsequent encounter for closed fracture with routine healing: Secondary | ICD-10-CM | POA: Diagnosis not present

## 2022-06-16 DIAGNOSIS — I129 Hypertensive chronic kidney disease with stage 1 through stage 4 chronic kidney disease, or unspecified chronic kidney disease: Secondary | ICD-10-CM | POA: Diagnosis not present

## 2022-06-16 DIAGNOSIS — S52201D Unspecified fracture of shaft of right ulna, subsequent encounter for closed fracture with routine healing: Secondary | ICD-10-CM | POA: Diagnosis not present

## 2022-06-16 DIAGNOSIS — S82251D Displaced comminuted fracture of shaft of right tibia, subsequent encounter for closed fracture with routine healing: Secondary | ICD-10-CM | POA: Diagnosis not present

## 2022-06-16 DIAGNOSIS — Z794 Long term (current) use of insulin: Secondary | ICD-10-CM | POA: Diagnosis not present

## 2022-06-16 DIAGNOSIS — N184 Chronic kidney disease, stage 4 (severe): Secondary | ICD-10-CM | POA: Diagnosis not present

## 2022-06-16 DIAGNOSIS — D62 Acute posthemorrhagic anemia: Secondary | ICD-10-CM | POA: Diagnosis not present

## 2022-06-18 DIAGNOSIS — S82251D Displaced comminuted fracture of shaft of right tibia, subsequent encounter for closed fracture with routine healing: Secondary | ICD-10-CM | POA: Diagnosis not present

## 2022-06-18 DIAGNOSIS — D62 Acute posthemorrhagic anemia: Secondary | ICD-10-CM | POA: Diagnosis not present

## 2022-06-18 DIAGNOSIS — I129 Hypertensive chronic kidney disease with stage 1 through stage 4 chronic kidney disease, or unspecified chronic kidney disease: Secondary | ICD-10-CM | POA: Diagnosis not present

## 2022-06-18 DIAGNOSIS — S52501D Unspecified fracture of the lower end of right radius, subsequent encounter for closed fracture with routine healing: Secondary | ICD-10-CM | POA: Diagnosis not present

## 2022-06-18 DIAGNOSIS — Z794 Long term (current) use of insulin: Secondary | ICD-10-CM | POA: Diagnosis not present

## 2022-06-18 DIAGNOSIS — E1122 Type 2 diabetes mellitus with diabetic chronic kidney disease: Secondary | ICD-10-CM | POA: Diagnosis not present

## 2022-06-18 DIAGNOSIS — S52201D Unspecified fracture of shaft of right ulna, subsequent encounter for closed fracture with routine healing: Secondary | ICD-10-CM | POA: Diagnosis not present

## 2022-06-18 DIAGNOSIS — N184 Chronic kidney disease, stage 4 (severe): Secondary | ICD-10-CM | POA: Diagnosis not present

## 2022-06-19 DIAGNOSIS — N184 Chronic kidney disease, stage 4 (severe): Secondary | ICD-10-CM | POA: Diagnosis not present

## 2022-06-19 DIAGNOSIS — Z794 Long term (current) use of insulin: Secondary | ICD-10-CM | POA: Diagnosis not present

## 2022-06-19 DIAGNOSIS — S52501D Unspecified fracture of the lower end of right radius, subsequent encounter for closed fracture with routine healing: Secondary | ICD-10-CM | POA: Diagnosis not present

## 2022-06-19 DIAGNOSIS — S52201D Unspecified fracture of shaft of right ulna, subsequent encounter for closed fracture with routine healing: Secondary | ICD-10-CM | POA: Diagnosis not present

## 2022-06-19 DIAGNOSIS — E1122 Type 2 diabetes mellitus with diabetic chronic kidney disease: Secondary | ICD-10-CM | POA: Diagnosis not present

## 2022-06-19 DIAGNOSIS — D62 Acute posthemorrhagic anemia: Secondary | ICD-10-CM | POA: Diagnosis not present

## 2022-06-19 DIAGNOSIS — I129 Hypertensive chronic kidney disease with stage 1 through stage 4 chronic kidney disease, or unspecified chronic kidney disease: Secondary | ICD-10-CM | POA: Diagnosis not present

## 2022-06-19 DIAGNOSIS — S82251D Displaced comminuted fracture of shaft of right tibia, subsequent encounter for closed fracture with routine healing: Secondary | ICD-10-CM | POA: Diagnosis not present

## 2022-06-23 ENCOUNTER — Other Ambulatory Visit: Payer: Self-pay

## 2022-06-23 MED ORDER — BD PEN NEEDLE NANO U/F 32G X 4 MM MISC: 2 refills | Status: DC

## 2022-06-24 DIAGNOSIS — E1122 Type 2 diabetes mellitus with diabetic chronic kidney disease: Secondary | ICD-10-CM | POA: Diagnosis not present

## 2022-06-24 DIAGNOSIS — I129 Hypertensive chronic kidney disease with stage 1 through stage 4 chronic kidney disease, or unspecified chronic kidney disease: Secondary | ICD-10-CM | POA: Diagnosis not present

## 2022-06-24 DIAGNOSIS — S82251D Displaced comminuted fracture of shaft of right tibia, subsequent encounter for closed fracture with routine healing: Secondary | ICD-10-CM | POA: Diagnosis not present

## 2022-06-24 DIAGNOSIS — D62 Acute posthemorrhagic anemia: Secondary | ICD-10-CM | POA: Diagnosis not present

## 2022-06-24 DIAGNOSIS — N184 Chronic kidney disease, stage 4 (severe): Secondary | ICD-10-CM | POA: Diagnosis not present

## 2022-06-24 DIAGNOSIS — S52201D Unspecified fracture of shaft of right ulna, subsequent encounter for closed fracture with routine healing: Secondary | ICD-10-CM | POA: Diagnosis not present

## 2022-06-24 DIAGNOSIS — Z794 Long term (current) use of insulin: Secondary | ICD-10-CM | POA: Diagnosis not present

## 2022-06-24 DIAGNOSIS — S52501D Unspecified fracture of the lower end of right radius, subsequent encounter for closed fracture with routine healing: Secondary | ICD-10-CM | POA: Diagnosis not present

## 2022-06-25 DIAGNOSIS — N2581 Secondary hyperparathyroidism of renal origin: Secondary | ICD-10-CM | POA: Diagnosis not present

## 2022-06-25 DIAGNOSIS — D62 Acute posthemorrhagic anemia: Secondary | ICD-10-CM | POA: Diagnosis not present

## 2022-06-25 DIAGNOSIS — N184 Chronic kidney disease, stage 4 (severe): Secondary | ICD-10-CM | POA: Diagnosis not present

## 2022-06-25 DIAGNOSIS — S82251D Displaced comminuted fracture of shaft of right tibia, subsequent encounter for closed fracture with routine healing: Secondary | ICD-10-CM | POA: Diagnosis not present

## 2022-06-25 DIAGNOSIS — Z794 Long term (current) use of insulin: Secondary | ICD-10-CM | POA: Diagnosis not present

## 2022-06-25 DIAGNOSIS — N281 Cyst of kidney, acquired: Secondary | ICD-10-CM | POA: Diagnosis not present

## 2022-06-25 DIAGNOSIS — E872 Acidosis, unspecified: Secondary | ICD-10-CM | POA: Diagnosis not present

## 2022-06-25 DIAGNOSIS — D631 Anemia in chronic kidney disease: Secondary | ICD-10-CM | POA: Diagnosis not present

## 2022-06-25 DIAGNOSIS — E1122 Type 2 diabetes mellitus with diabetic chronic kidney disease: Secondary | ICD-10-CM | POA: Diagnosis not present

## 2022-06-25 DIAGNOSIS — S52201D Unspecified fracture of shaft of right ulna, subsequent encounter for closed fracture with routine healing: Secondary | ICD-10-CM | POA: Diagnosis not present

## 2022-06-25 DIAGNOSIS — S52501D Unspecified fracture of the lower end of right radius, subsequent encounter for closed fracture with routine healing: Secondary | ICD-10-CM | POA: Diagnosis not present

## 2022-06-25 DIAGNOSIS — I129 Hypertensive chronic kidney disease with stage 1 through stage 4 chronic kidney disease, or unspecified chronic kidney disease: Secondary | ICD-10-CM | POA: Diagnosis not present

## 2022-06-27 DIAGNOSIS — Z794 Long term (current) use of insulin: Secondary | ICD-10-CM | POA: Diagnosis not present

## 2022-06-27 DIAGNOSIS — I129 Hypertensive chronic kidney disease with stage 1 through stage 4 chronic kidney disease, or unspecified chronic kidney disease: Secondary | ICD-10-CM | POA: Diagnosis not present

## 2022-06-27 DIAGNOSIS — S82251D Displaced comminuted fracture of shaft of right tibia, subsequent encounter for closed fracture with routine healing: Secondary | ICD-10-CM | POA: Diagnosis not present

## 2022-06-27 DIAGNOSIS — N184 Chronic kidney disease, stage 4 (severe): Secondary | ICD-10-CM | POA: Diagnosis not present

## 2022-06-27 DIAGNOSIS — E1122 Type 2 diabetes mellitus with diabetic chronic kidney disease: Secondary | ICD-10-CM | POA: Diagnosis not present

## 2022-06-27 DIAGNOSIS — D62 Acute posthemorrhagic anemia: Secondary | ICD-10-CM | POA: Diagnosis not present

## 2022-06-27 DIAGNOSIS — S52201D Unspecified fracture of shaft of right ulna, subsequent encounter for closed fracture with routine healing: Secondary | ICD-10-CM | POA: Diagnosis not present

## 2022-06-27 DIAGNOSIS — S52501D Unspecified fracture of the lower end of right radius, subsequent encounter for closed fracture with routine healing: Secondary | ICD-10-CM | POA: Diagnosis not present

## 2022-06-28 ENCOUNTER — Other Ambulatory Visit: Payer: Self-pay | Admitting: Physician Assistant

## 2022-07-02 DIAGNOSIS — D62 Acute posthemorrhagic anemia: Secondary | ICD-10-CM | POA: Diagnosis not present

## 2022-07-02 DIAGNOSIS — S82251D Displaced comminuted fracture of shaft of right tibia, subsequent encounter for closed fracture with routine healing: Secondary | ICD-10-CM | POA: Diagnosis not present

## 2022-07-02 DIAGNOSIS — S52201D Unspecified fracture of shaft of right ulna, subsequent encounter for closed fracture with routine healing: Secondary | ICD-10-CM | POA: Diagnosis not present

## 2022-07-02 DIAGNOSIS — E1122 Type 2 diabetes mellitus with diabetic chronic kidney disease: Secondary | ICD-10-CM | POA: Diagnosis not present

## 2022-07-02 DIAGNOSIS — I129 Hypertensive chronic kidney disease with stage 1 through stage 4 chronic kidney disease, or unspecified chronic kidney disease: Secondary | ICD-10-CM | POA: Diagnosis not present

## 2022-07-02 DIAGNOSIS — S52501D Unspecified fracture of the lower end of right radius, subsequent encounter for closed fracture with routine healing: Secondary | ICD-10-CM | POA: Diagnosis not present

## 2022-07-02 DIAGNOSIS — Z794 Long term (current) use of insulin: Secondary | ICD-10-CM | POA: Diagnosis not present

## 2022-07-02 DIAGNOSIS — N184 Chronic kidney disease, stage 4 (severe): Secondary | ICD-10-CM | POA: Diagnosis not present

## 2022-07-03 ENCOUNTER — Ambulatory Visit (HOSPITAL_COMMUNITY)
Admission: RE | Admit: 2022-07-03 | Discharge: 2022-07-03 | Disposition: A | Discharge status: Home / Self Care | Payer: Medicare Other | Source: Ambulatory Visit | Attending: Nephrology | Admitting: Nephrology

## 2022-07-03 ENCOUNTER — Encounter: Payer: Self-pay | Admitting: Internal Medicine

## 2022-07-03 VITALS — BP 133/64 | HR 56 | Temp 97.5°F | Resp 18

## 2022-07-03 DIAGNOSIS — E1122 Type 2 diabetes mellitus with diabetic chronic kidney disease: Secondary | ICD-10-CM | POA: Insufficient documentation

## 2022-07-03 DIAGNOSIS — S82201A Unspecified fracture of shaft of right tibia, initial encounter for closed fracture: Secondary | ICD-10-CM | POA: Diagnosis not present

## 2022-07-03 DIAGNOSIS — T07XXXA Unspecified multiple injuries, initial encounter: Secondary | ICD-10-CM | POA: Diagnosis not present

## 2022-07-03 LAB — IRON AND TIBC
Iron: 62 ug/dL (ref 28–170)
Saturation Ratios: 28 % (ref 10.4–31.8)
TIBC: 223 ug/dL — ABNORMAL LOW (ref 250–450)
UIBC: 161 ug/dL

## 2022-07-03 LAB — POCT HEMOGLOBIN-HEMACUE: Hemoglobin: 10.7 g/dL — ABNORMAL LOW (ref 12.0–15.0)

## 2022-07-03 LAB — FERRITIN: Ferritin: 310 ng/mL — ABNORMAL HIGH (ref 11–307)

## 2022-07-03 MED ORDER — EPOETIN ALFA-EPBX 10000 UNIT/ML IJ SOLN
INTRAMUSCULAR | Status: AC
  Administered 2022-07-03: 20000 [IU] via SUBCUTANEOUS
  Filled 2022-07-03: qty 2

## 2022-07-03 MED ORDER — EPOETIN ALFA-EPBX 10000 UNIT/ML IJ SOLN: 20000.0000 [IU] | INTRAMUSCULAR | Status: DC

## 2022-07-04 DIAGNOSIS — N184 Chronic kidney disease, stage 4 (severe): Secondary | ICD-10-CM | POA: Diagnosis not present

## 2022-07-04 DIAGNOSIS — D62 Acute posthemorrhagic anemia: Secondary | ICD-10-CM | POA: Diagnosis not present

## 2022-07-04 DIAGNOSIS — S82251D Displaced comminuted fracture of shaft of right tibia, subsequent encounter for closed fracture with routine healing: Secondary | ICD-10-CM | POA: Diagnosis not present

## 2022-07-04 DIAGNOSIS — S52501D Unspecified fracture of the lower end of right radius, subsequent encounter for closed fracture with routine healing: Secondary | ICD-10-CM | POA: Diagnosis not present

## 2022-07-04 DIAGNOSIS — Z794 Long term (current) use of insulin: Secondary | ICD-10-CM | POA: Diagnosis not present

## 2022-07-04 DIAGNOSIS — S52201D Unspecified fracture of shaft of right ulna, subsequent encounter for closed fracture with routine healing: Secondary | ICD-10-CM | POA: Diagnosis not present

## 2022-07-04 DIAGNOSIS — I129 Hypertensive chronic kidney disease with stage 1 through stage 4 chronic kidney disease, or unspecified chronic kidney disease: Secondary | ICD-10-CM | POA: Diagnosis not present

## 2022-07-04 DIAGNOSIS — E1122 Type 2 diabetes mellitus with diabetic chronic kidney disease: Secondary | ICD-10-CM | POA: Diagnosis not present

## 2022-07-05 DIAGNOSIS — T07XXXA Unspecified multiple injuries, initial encounter: Secondary | ICD-10-CM | POA: Diagnosis not present

## 2022-07-05 DIAGNOSIS — S82201A Unspecified fracture of shaft of right tibia, initial encounter for closed fracture: Secondary | ICD-10-CM | POA: Diagnosis not present

## 2022-07-06 ENCOUNTER — Other Ambulatory Visit: Payer: Self-pay | Admitting: Internal Medicine

## 2022-07-08 MED ORDER — FOLIC ACID 1 MG PO TABS: 1.0000 mg | ORAL_TABLET | Freq: Every day | ORAL | 0 refills | Status: DC

## 2022-07-08 MED ORDER — FLUOXETINE HCL 10 MG PO CAPS: 10.0000 mg | ORAL_CAPSULE | Freq: Every day | ORAL | 0 refills | Status: DC

## 2022-07-08 MED ORDER — CYANOCOBALAMIN 1000 MCG PO TABS: 1000.0000 ug | ORAL_TABLET | Freq: Every day | ORAL | 0 refills | Status: DC

## 2022-07-09 DIAGNOSIS — S52201D Unspecified fracture of shaft of right ulna, subsequent encounter for closed fracture with routine healing: Secondary | ICD-10-CM | POA: Diagnosis not present

## 2022-07-09 DIAGNOSIS — S52501D Unspecified fracture of the lower end of right radius, subsequent encounter for closed fracture with routine healing: Secondary | ICD-10-CM | POA: Diagnosis not present

## 2022-07-09 DIAGNOSIS — Z794 Long term (current) use of insulin: Secondary | ICD-10-CM | POA: Diagnosis not present

## 2022-07-09 DIAGNOSIS — N184 Chronic kidney disease, stage 4 (severe): Secondary | ICD-10-CM | POA: Diagnosis not present

## 2022-07-09 DIAGNOSIS — D62 Acute posthemorrhagic anemia: Secondary | ICD-10-CM | POA: Diagnosis not present

## 2022-07-09 DIAGNOSIS — E1122 Type 2 diabetes mellitus with diabetic chronic kidney disease: Secondary | ICD-10-CM | POA: Diagnosis not present

## 2022-07-09 DIAGNOSIS — I129 Hypertensive chronic kidney disease with stage 1 through stage 4 chronic kidney disease, or unspecified chronic kidney disease: Secondary | ICD-10-CM | POA: Diagnosis not present

## 2022-07-09 DIAGNOSIS — S82251D Displaced comminuted fracture of shaft of right tibia, subsequent encounter for closed fracture with routine healing: Secondary | ICD-10-CM | POA: Diagnosis not present

## 2022-07-10 DIAGNOSIS — Z794 Long term (current) use of insulin: Secondary | ICD-10-CM | POA: Diagnosis not present

## 2022-07-10 DIAGNOSIS — D62 Acute posthemorrhagic anemia: Secondary | ICD-10-CM | POA: Diagnosis not present

## 2022-07-10 DIAGNOSIS — I129 Hypertensive chronic kidney disease with stage 1 through stage 4 chronic kidney disease, or unspecified chronic kidney disease: Secondary | ICD-10-CM | POA: Diagnosis not present

## 2022-07-10 DIAGNOSIS — S52501D Unspecified fracture of the lower end of right radius, subsequent encounter for closed fracture with routine healing: Secondary | ICD-10-CM | POA: Diagnosis not present

## 2022-07-10 DIAGNOSIS — S82251D Displaced comminuted fracture of shaft of right tibia, subsequent encounter for closed fracture with routine healing: Secondary | ICD-10-CM | POA: Diagnosis not present

## 2022-07-10 DIAGNOSIS — N184 Chronic kidney disease, stage 4 (severe): Secondary | ICD-10-CM | POA: Diagnosis not present

## 2022-07-10 DIAGNOSIS — S52201D Unspecified fracture of shaft of right ulna, subsequent encounter for closed fracture with routine healing: Secondary | ICD-10-CM | POA: Diagnosis not present

## 2022-07-10 DIAGNOSIS — E1122 Type 2 diabetes mellitus with diabetic chronic kidney disease: Secondary | ICD-10-CM | POA: Diagnosis not present

## 2022-07-11 ENCOUNTER — Encounter: Payer: Self-pay | Admitting: Internal Medicine

## 2022-07-11 DIAGNOSIS — S82251D Displaced comminuted fracture of shaft of right tibia, subsequent encounter for closed fracture with routine healing: Secondary | ICD-10-CM | POA: Diagnosis not present

## 2022-07-13 ENCOUNTER — Encounter: Payer: Self-pay | Admitting: Internal Medicine

## 2022-07-14 DIAGNOSIS — I129 Hypertensive chronic kidney disease with stage 1 through stage 4 chronic kidney disease, or unspecified chronic kidney disease: Secondary | ICD-10-CM | POA: Diagnosis not present

## 2022-07-14 DIAGNOSIS — S82251D Displaced comminuted fracture of shaft of right tibia, subsequent encounter for closed fracture with routine healing: Secondary | ICD-10-CM | POA: Diagnosis not present

## 2022-07-14 DIAGNOSIS — N184 Chronic kidney disease, stage 4 (severe): Secondary | ICD-10-CM | POA: Diagnosis not present

## 2022-07-14 DIAGNOSIS — E1122 Type 2 diabetes mellitus with diabetic chronic kidney disease: Secondary | ICD-10-CM | POA: Diagnosis not present

## 2022-07-14 DIAGNOSIS — Z794 Long term (current) use of insulin: Secondary | ICD-10-CM | POA: Diagnosis not present

## 2022-07-14 DIAGNOSIS — D62 Acute posthemorrhagic anemia: Secondary | ICD-10-CM | POA: Diagnosis not present

## 2022-07-14 DIAGNOSIS — S52201D Unspecified fracture of shaft of right ulna, subsequent encounter for closed fracture with routine healing: Secondary | ICD-10-CM | POA: Diagnosis not present

## 2022-07-14 DIAGNOSIS — S52501D Unspecified fracture of the lower end of right radius, subsequent encounter for closed fracture with routine healing: Secondary | ICD-10-CM | POA: Diagnosis not present

## 2022-07-16 DIAGNOSIS — N184 Chronic kidney disease, stage 4 (severe): Secondary | ICD-10-CM | POA: Diagnosis not present

## 2022-07-16 DIAGNOSIS — S82251D Displaced comminuted fracture of shaft of right tibia, subsequent encounter for closed fracture with routine healing: Secondary | ICD-10-CM | POA: Diagnosis not present

## 2022-07-16 DIAGNOSIS — E1122 Type 2 diabetes mellitus with diabetic chronic kidney disease: Secondary | ICD-10-CM | POA: Diagnosis not present

## 2022-07-16 DIAGNOSIS — S52201D Unspecified fracture of shaft of right ulna, subsequent encounter for closed fracture with routine healing: Secondary | ICD-10-CM | POA: Diagnosis not present

## 2022-07-16 DIAGNOSIS — Z794 Long term (current) use of insulin: Secondary | ICD-10-CM | POA: Diagnosis not present

## 2022-07-16 DIAGNOSIS — I129 Hypertensive chronic kidney disease with stage 1 through stage 4 chronic kidney disease, or unspecified chronic kidney disease: Secondary | ICD-10-CM | POA: Diagnosis not present

## 2022-07-16 DIAGNOSIS — S52501D Unspecified fracture of the lower end of right radius, subsequent encounter for closed fracture with routine healing: Secondary | ICD-10-CM | POA: Diagnosis not present

## 2022-07-16 DIAGNOSIS — D62 Acute posthemorrhagic anemia: Secondary | ICD-10-CM | POA: Diagnosis not present

## 2022-07-17 DIAGNOSIS — D62 Acute posthemorrhagic anemia: Secondary | ICD-10-CM | POA: Diagnosis not present

## 2022-07-17 DIAGNOSIS — E1122 Type 2 diabetes mellitus with diabetic chronic kidney disease: Secondary | ICD-10-CM | POA: Diagnosis not present

## 2022-07-17 DIAGNOSIS — N184 Chronic kidney disease, stage 4 (severe): Secondary | ICD-10-CM | POA: Diagnosis not present

## 2022-07-17 DIAGNOSIS — I129 Hypertensive chronic kidney disease with stage 1 through stage 4 chronic kidney disease, or unspecified chronic kidney disease: Secondary | ICD-10-CM | POA: Diagnosis not present

## 2022-07-17 DIAGNOSIS — S52201D Unspecified fracture of shaft of right ulna, subsequent encounter for closed fracture with routine healing: Secondary | ICD-10-CM | POA: Diagnosis not present

## 2022-07-17 DIAGNOSIS — S82251D Displaced comminuted fracture of shaft of right tibia, subsequent encounter for closed fracture with routine healing: Secondary | ICD-10-CM | POA: Diagnosis not present

## 2022-07-17 DIAGNOSIS — Z794 Long term (current) use of insulin: Secondary | ICD-10-CM | POA: Diagnosis not present

## 2022-07-17 DIAGNOSIS — S52501D Unspecified fracture of the lower end of right radius, subsequent encounter for closed fracture with routine healing: Secondary | ICD-10-CM | POA: Diagnosis not present

## 2022-07-20 ENCOUNTER — Other Ambulatory Visit (INDEPENDENT_AMBULATORY_CARE_PROVIDER_SITE_OTHER): Payer: Medicare Other | Admitting: Internal Medicine

## 2022-07-20 DIAGNOSIS — E1129 Type 2 diabetes mellitus with other diabetic kidney complication: Secondary | ICD-10-CM | POA: Diagnosis not present

## 2022-07-20 DIAGNOSIS — R1319 Other dysphagia: Secondary | ICD-10-CM | POA: Diagnosis not present

## 2022-07-20 DIAGNOSIS — S52501D Unspecified fracture of the lower end of right radius, subsequent encounter for closed fracture with routine healing: Secondary | ICD-10-CM

## 2022-07-20 DIAGNOSIS — Z794 Long term (current) use of insulin: Secondary | ICD-10-CM | POA: Diagnosis not present

## 2022-07-20 DIAGNOSIS — K219 Gastro-esophageal reflux disease without esophagitis: Secondary | ICD-10-CM

## 2022-07-20 DIAGNOSIS — D62 Acute posthemorrhagic anemia: Secondary | ICD-10-CM | POA: Diagnosis not present

## 2022-07-20 DIAGNOSIS — W19XXXD Unspecified fall, subsequent encounter: Secondary | ICD-10-CM | POA: Diagnosis not present

## 2022-07-20 DIAGNOSIS — S82251D Displaced comminuted fracture of shaft of right tibia, subsequent encounter for closed fracture with routine healing: Secondary | ICD-10-CM

## 2022-07-20 DIAGNOSIS — S82201S Unspecified fracture of shaft of right tibia, sequela: Secondary | ICD-10-CM

## 2022-07-20 DIAGNOSIS — R809 Proteinuria, unspecified: Secondary | ICD-10-CM | POA: Diagnosis not present

## 2022-07-20 DIAGNOSIS — J189 Pneumonia, unspecified organism: Secondary | ICD-10-CM

## 2022-07-20 NOTE — Progress Notes (Signed)
Cc: home health certification   Received home health orders orders from [facility]. Start of care 06/09/22.   Certification and orders from 06/09/22 through 08/07/22 are reviewed, signed and faxed back to home health company.  Need of intermittent skilled services at home: PT/OT/ST  The home health care plan has been established by me and will be reviewed and updated as needed to maximize patient recovery.  I certify that all home health services have been and will be furnished to the patient while under my care.  Face-to-face encounter in which the need for home health services was established: 05/19/22  Patient is receiving home health services for the following diagnoses: Problem List Items Addressed This Visit       Respiratory   PNA (pneumonia)     Digestive   Gastroesophageal reflux disease   Dysphagia, neurologic     Endocrine   Type II diabetes mellitus     Musculoskeletal and Integument   Right radial fracture   Right tibial fracture - Primary   Other Visit Diagnoses     Acute posthemorrhagic anemia       Cause of injury, accidental fall, subsequent encounter       Closed displaced comminuted fracture of shaft of right tibia with routine healing            [name], MD

## 2022-07-22 DIAGNOSIS — D62 Acute posthemorrhagic anemia: Secondary | ICD-10-CM | POA: Diagnosis not present

## 2022-07-22 DIAGNOSIS — N184 Chronic kidney disease, stage 4 (severe): Secondary | ICD-10-CM | POA: Diagnosis not present

## 2022-07-22 DIAGNOSIS — Z794 Long term (current) use of insulin: Secondary | ICD-10-CM | POA: Diagnosis not present

## 2022-07-22 DIAGNOSIS — E1122 Type 2 diabetes mellitus with diabetic chronic kidney disease: Secondary | ICD-10-CM | POA: Diagnosis not present

## 2022-07-22 DIAGNOSIS — S52201D Unspecified fracture of shaft of right ulna, subsequent encounter for closed fracture with routine healing: Secondary | ICD-10-CM | POA: Diagnosis not present

## 2022-07-22 DIAGNOSIS — S82251D Displaced comminuted fracture of shaft of right tibia, subsequent encounter for closed fracture with routine healing: Secondary | ICD-10-CM | POA: Diagnosis not present

## 2022-07-22 DIAGNOSIS — I129 Hypertensive chronic kidney disease with stage 1 through stage 4 chronic kidney disease, or unspecified chronic kidney disease: Secondary | ICD-10-CM | POA: Diagnosis not present

## 2022-07-22 DIAGNOSIS — S52501D Unspecified fracture of the lower end of right radius, subsequent encounter for closed fracture with routine healing: Secondary | ICD-10-CM | POA: Diagnosis not present

## 2022-07-23 DIAGNOSIS — S82251D Displaced comminuted fracture of shaft of right tibia, subsequent encounter for closed fracture with routine healing: Secondary | ICD-10-CM | POA: Diagnosis not present

## 2022-07-23 DIAGNOSIS — S52501D Unspecified fracture of the lower end of right radius, subsequent encounter for closed fracture with routine healing: Secondary | ICD-10-CM | POA: Diagnosis not present

## 2022-07-23 DIAGNOSIS — N184 Chronic kidney disease, stage 4 (severe): Secondary | ICD-10-CM | POA: Diagnosis not present

## 2022-07-23 DIAGNOSIS — I129 Hypertensive chronic kidney disease with stage 1 through stage 4 chronic kidney disease, or unspecified chronic kidney disease: Secondary | ICD-10-CM | POA: Diagnosis not present

## 2022-07-23 DIAGNOSIS — D62 Acute posthemorrhagic anemia: Secondary | ICD-10-CM | POA: Diagnosis not present

## 2022-07-23 DIAGNOSIS — S52501A Unspecified fracture of the lower end of right radius, initial encounter for closed fracture: Secondary | ICD-10-CM | POA: Diagnosis not present

## 2022-07-23 DIAGNOSIS — Z794 Long term (current) use of insulin: Secondary | ICD-10-CM | POA: Diagnosis not present

## 2022-07-23 DIAGNOSIS — S52201D Unspecified fracture of shaft of right ulna, subsequent encounter for closed fracture with routine healing: Secondary | ICD-10-CM | POA: Diagnosis not present

## 2022-07-23 DIAGNOSIS — E1122 Type 2 diabetes mellitus with diabetic chronic kidney disease: Secondary | ICD-10-CM | POA: Diagnosis not present

## 2022-07-23 DIAGNOSIS — M25531 Pain in right wrist: Secondary | ICD-10-CM | POA: Diagnosis not present

## 2022-07-25 ENCOUNTER — Encounter: Payer: Self-pay | Admitting: Podiatry

## 2022-07-25 ENCOUNTER — Ambulatory Visit (INDEPENDENT_AMBULATORY_CARE_PROVIDER_SITE_OTHER): Payer: Medicare Other | Admitting: Podiatry

## 2022-07-25 VITALS — BP 117/62

## 2022-07-25 DIAGNOSIS — S52501D Unspecified fracture of the lower end of right radius, subsequent encounter for closed fracture with routine healing: Secondary | ICD-10-CM | POA: Diagnosis not present

## 2022-07-25 DIAGNOSIS — S82251D Displaced comminuted fracture of shaft of right tibia, subsequent encounter for closed fracture with routine healing: Secondary | ICD-10-CM | POA: Diagnosis not present

## 2022-07-25 DIAGNOSIS — N184 Chronic kidney disease, stage 4 (severe): Secondary | ICD-10-CM

## 2022-07-25 DIAGNOSIS — B351 Tinea unguium: Secondary | ICD-10-CM

## 2022-07-25 DIAGNOSIS — E1122 Type 2 diabetes mellitus with diabetic chronic kidney disease: Secondary | ICD-10-CM | POA: Diagnosis not present

## 2022-07-25 DIAGNOSIS — M79675 Pain in left toe(s): Secondary | ICD-10-CM | POA: Diagnosis not present

## 2022-07-25 DIAGNOSIS — L84 Corns and callosities: Secondary | ICD-10-CM | POA: Diagnosis not present

## 2022-07-25 DIAGNOSIS — I129 Hypertensive chronic kidney disease with stage 1 through stage 4 chronic kidney disease, or unspecified chronic kidney disease: Secondary | ICD-10-CM | POA: Diagnosis not present

## 2022-07-25 DIAGNOSIS — S52201D Unspecified fracture of shaft of right ulna, subsequent encounter for closed fracture with routine healing: Secondary | ICD-10-CM | POA: Diagnosis not present

## 2022-07-25 DIAGNOSIS — Z794 Long term (current) use of insulin: Secondary | ICD-10-CM | POA: Diagnosis not present

## 2022-07-25 DIAGNOSIS — M79674 Pain in right toe(s): Secondary | ICD-10-CM | POA: Diagnosis not present

## 2022-07-25 DIAGNOSIS — D62 Acute posthemorrhagic anemia: Secondary | ICD-10-CM | POA: Diagnosis not present

## 2022-07-25 NOTE — Progress Notes (Signed)
  Subjective:  Patient ID: Miranda Roman, female    DOB: [birth year],  MRN: [id number]  PRUE LINGENFELTER presents to clinic today for at risk foot care. Pt has h/o NIDDM with chronic kidney disease and callus(es) b/l lower extremities and painful thick toenails that are difficult to trim. Painful toenails interfere with ambulation. Aggravating factors include wearing enclosed shoe gear. Pain is relieved with periodic professional debridement. Painful calluses are aggravated when weightbearing with and without shoegear. Pain is relieved with periodic professional debridement.  Chief Complaint  Patient presents with   Nail Problem    DFC/Left great toe pain BS-did not check today A1C-5.9 PCP-Sanders,Robyn PCP VST-06/2022   Her daughters are present during today's visit. Patient states her left great toe is sore and points to medial border. Denies any redness, drainage or swelling.  PCP is Glendale Chard, MD.  Allergies  Allergen Reactions   Cefepime Other (See Comments)    Causes encephalitis - reported by [facility] 12/31/2019   Lactose Intolerance (Gi)     Per pt's daughter causes gas    Review of Systems: Negative except as noted in the HPI.  Objective: No changes noted in today's physical examination. Vitals:   07/25/22 0913  BP: 117/62   Miranda Roman is a pleasant 77 y.o. female in NAD. AAO x 3. Vascular:  Capillary refill time to digits immediate b/l. Palpable pedal pulses b/l LE. Pedal hair absent. Lower extremity skin temperature gradient within normal limits.  Dermatological:  Skin warm and supple b/l lower extremities. No open wounds b/l lower extremities. No interdigital macerations b/l lower extremities. Toenails 1-5 b/l elongated, discolored, dystrophic, thickened, crumbly with subungual debris and tenderness to dorsal palpation.   Hyperkeratotic lesion(s) submet head 5 b/l and submet head 1 right foot. No erythema, no edema, no drainage, no fluctuance.    Musculoskeletal:  Normal muscle strength 5/5 to all lower extremity muscle groups bilaterally. Tailor's bunion deformity noted b/l lower extremities.  Neurological:  Protective sensation intact 5/5 intact bilaterally with 10g monofilament b/l. Vibratory sensation intact b/l.  Assessment/Plan: 1. Pain due to onychomycosis of toenails of both feet   2. Callus   3. Porokeratosis   4. 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)     No orders of the defined types were placed in this encounter.  -Consent given for treatment as described below: -Examined patient. -Patient to continue soft, supportive shoe gear daily. -Toenails 1-5 b/l were debrided in length and girth with sterile nail nippers and dremel without iatrogenic bleeding.  -Callus(es) submet head 1 right foot and submet head 5 b/l pared utilizing sterile scalpel blade without complication or incident. Total number debrided =3. -Patient/POA to call should there be question/concern in the interim.   Return in about 3 months (around 10/24/2022).  Marzetta Board, DPM

## 2022-07-29 DIAGNOSIS — I129 Hypertensive chronic kidney disease with stage 1 through stage 4 chronic kidney disease, or unspecified chronic kidney disease: Secondary | ICD-10-CM | POA: Diagnosis not present

## 2022-07-29 DIAGNOSIS — S52201D Unspecified fracture of shaft of right ulna, subsequent encounter for closed fracture with routine healing: Secondary | ICD-10-CM | POA: Diagnosis not present

## 2022-07-29 DIAGNOSIS — D62 Acute posthemorrhagic anemia: Secondary | ICD-10-CM | POA: Diagnosis not present

## 2022-07-29 DIAGNOSIS — S52501D Unspecified fracture of the lower end of right radius, subsequent encounter for closed fracture with routine healing: Secondary | ICD-10-CM | POA: Diagnosis not present

## 2022-07-29 DIAGNOSIS — Z794 Long term (current) use of insulin: Secondary | ICD-10-CM | POA: Diagnosis not present

## 2022-07-29 DIAGNOSIS — N184 Chronic kidney disease, stage 4 (severe): Secondary | ICD-10-CM | POA: Diagnosis not present

## 2022-07-29 DIAGNOSIS — S82251D Displaced comminuted fracture of shaft of right tibia, subsequent encounter for closed fracture with routine healing: Secondary | ICD-10-CM | POA: Diagnosis not present

## 2022-07-29 DIAGNOSIS — E1122 Type 2 diabetes mellitus with diabetic chronic kidney disease: Secondary | ICD-10-CM | POA: Diagnosis not present

## 2022-07-31 ENCOUNTER — Encounter (HOSPITAL_COMMUNITY): Payer: Medicare Other

## 2022-07-31 DIAGNOSIS — I129 Hypertensive chronic kidney disease with stage 1 through stage 4 chronic kidney disease, or unspecified chronic kidney disease: Secondary | ICD-10-CM | POA: Diagnosis not present

## 2022-07-31 DIAGNOSIS — S82251D Displaced comminuted fracture of shaft of right tibia, subsequent encounter for closed fracture with routine healing: Secondary | ICD-10-CM | POA: Diagnosis not present

## 2022-07-31 DIAGNOSIS — E1122 Type 2 diabetes mellitus with diabetic chronic kidney disease: Secondary | ICD-10-CM | POA: Diagnosis not present

## 2022-07-31 DIAGNOSIS — S52201D Unspecified fracture of shaft of right ulna, subsequent encounter for closed fracture with routine healing: Secondary | ICD-10-CM | POA: Diagnosis not present

## 2022-07-31 DIAGNOSIS — S52501D Unspecified fracture of the lower end of right radius, subsequent encounter for closed fracture with routine healing: Secondary | ICD-10-CM | POA: Diagnosis not present

## 2022-07-31 DIAGNOSIS — D62 Acute posthemorrhagic anemia: Secondary | ICD-10-CM | POA: Diagnosis not present

## 2022-07-31 DIAGNOSIS — Z794 Long term (current) use of insulin: Secondary | ICD-10-CM | POA: Diagnosis not present

## 2022-07-31 DIAGNOSIS — N184 Chronic kidney disease, stage 4 (severe): Secondary | ICD-10-CM | POA: Diagnosis not present

## 2022-08-01 ENCOUNTER — Other Ambulatory Visit: Payer: Self-pay | Admitting: Internal Medicine

## 2022-08-02 ENCOUNTER — Other Ambulatory Visit: Payer: Self-pay | Admitting: Internal Medicine

## 2022-08-03 DIAGNOSIS — T07XXXA Unspecified multiple injuries, initial encounter: Secondary | ICD-10-CM | POA: Diagnosis not present

## 2022-08-03 DIAGNOSIS — S82201A Unspecified fracture of shaft of right tibia, initial encounter for closed fracture: Secondary | ICD-10-CM | POA: Diagnosis not present

## 2022-08-04 DIAGNOSIS — S52501D Unspecified fracture of the lower end of right radius, subsequent encounter for closed fracture with routine healing: Secondary | ICD-10-CM | POA: Diagnosis not present

## 2022-08-04 DIAGNOSIS — S52201D Unspecified fracture of shaft of right ulna, subsequent encounter for closed fracture with routine healing: Secondary | ICD-10-CM | POA: Diagnosis not present

## 2022-08-04 DIAGNOSIS — D62 Acute posthemorrhagic anemia: Secondary | ICD-10-CM | POA: Diagnosis not present

## 2022-08-04 DIAGNOSIS — I129 Hypertensive chronic kidney disease with stage 1 through stage 4 chronic kidney disease, or unspecified chronic kidney disease: Secondary | ICD-10-CM | POA: Diagnosis not present

## 2022-08-04 DIAGNOSIS — S82251D Displaced comminuted fracture of shaft of right tibia, subsequent encounter for closed fracture with routine healing: Secondary | ICD-10-CM | POA: Diagnosis not present

## 2022-08-04 DIAGNOSIS — E1122 Type 2 diabetes mellitus with diabetic chronic kidney disease: Secondary | ICD-10-CM | POA: Diagnosis not present

## 2022-08-04 DIAGNOSIS — N184 Chronic kidney disease, stage 4 (severe): Secondary | ICD-10-CM | POA: Diagnosis not present

## 2022-08-04 DIAGNOSIS — Z794 Long term (current) use of insulin: Secondary | ICD-10-CM | POA: Diagnosis not present

## 2022-08-05 ENCOUNTER — Other Ambulatory Visit (INDEPENDENT_AMBULATORY_CARE_PROVIDER_SITE_OTHER): Payer: Medicare Other | Admitting: Internal Medicine

## 2022-08-05 DIAGNOSIS — T07XXXA Unspecified multiple injuries, initial encounter: Secondary | ICD-10-CM | POA: Diagnosis not present

## 2022-08-05 DIAGNOSIS — E1142 Type 2 diabetes mellitus with diabetic polyneuropathy: Secondary | ICD-10-CM | POA: Diagnosis not present

## 2022-08-05 DIAGNOSIS — D62 Acute posthemorrhagic anemia: Secondary | ICD-10-CM

## 2022-08-05 DIAGNOSIS — E113551 Type 2 diabetes mellitus with stable proliferative diabetic retinopathy, right eye: Secondary | ICD-10-CM | POA: Diagnosis not present

## 2022-08-05 DIAGNOSIS — S82201A Unspecified fracture of shaft of right tibia, initial encounter for closed fracture: Secondary | ICD-10-CM | POA: Diagnosis not present

## 2022-08-05 DIAGNOSIS — Z794 Long term (current) use of insulin: Secondary | ICD-10-CM | POA: Diagnosis not present

## 2022-08-05 DIAGNOSIS — S52501D Unspecified fracture of the lower end of right radius, subsequent encounter for closed fracture with routine healing: Secondary | ICD-10-CM | POA: Diagnosis not present

## 2022-08-05 DIAGNOSIS — E7800 Pure hypercholesterolemia, unspecified: Secondary | ICD-10-CM

## 2022-08-05 DIAGNOSIS — W19XXXD Unspecified fall, subsequent encounter: Secondary | ICD-10-CM

## 2022-08-05 DIAGNOSIS — N184 Chronic kidney disease, stage 4 (severe): Secondary | ICD-10-CM

## 2022-08-05 DIAGNOSIS — S82401A Unspecified fracture of shaft of right fibula, initial encounter for closed fracture: Secondary | ICD-10-CM

## 2022-08-05 NOTE — Progress Notes (Signed)
PT:[facility] health recertification  Received home health orders orders from [facility]. Start of care 06/09/22.   Certification and orders from 08/08/22 through 10/06/22 are reviewed, signed and faxed back to home health company.  Need of intermittent skilled services at home: PT  The home health care plan has been established by me and will be reviewed and updated as needed to maximize patient recovery.  I certify that all home health services have been and will be furnished to the patient while under my care.  Face-to-face encounter in which the need for home health services was established: 05/19/22  Patient is receiving home health services for the following diagnoses: Problem List Items Addressed This Visit       Endocrine   Proliferative diabetic retinopathy of right eye   Polyneuropathy due to type 2 diabetes mellitus     Musculoskeletal and Integument   Tibia/fibula fracture, right, closed, initial encounter   Right radial fracture     Genitourinary   Chronic kidney disease, stage 4 (severe)     Other   Hyperlipidemia   Long term (current) use of insulin   Other Visit Diagnoses     Accidental fall, subsequent encounter [W19.XXXD]    -  Primary   Acute posthemorrhagic anemia [D62]            [name], MD

## 2022-08-06 NOTE — Progress Notes (Deleted)
NEUROLOGY FOLLOW UP OFFICE NOTE  CARDIN [place] [id number]  Assessment/Plan:     Mild neurocognitive disorder - multifactorial (cerebrovascular disease, CKD, anxiety) but does not appear to be a neurodegenerative disease at this time.   Vision loss in right eye secondary to vitreous hemorrhage, improved   1  *** 2  Plan to repeat neuropsychological evaluation in one year.   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  She is accompanied by her daughter who provides collateral history.   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    Referred to SPT for cognitive therapy.  ***     HISTORY: She was admitted to [facility] on 12/21/2019 for chest pain.  Earlier that day, she fell into a freshwater pond.  She felt fine but developed chest and back pain that evening.  VQ scan and dopplers were negative and cardiac workup overall unremarkable except for moderate pulmonary hypertension.  During hospitalization, she developed sudden onset of encephalopathy in setting of leukocytosis and fever.  ID and neurology were consulted.  She underwent MRI of brain, EEG and lumbar puncture which were unrevealing.  She had also developed anemia, thrombocytopenia, transaminitis and acute injury.  TTP was considered and she was treated with 5 rounds of plasmapheresis and steroids without improvement. For infection, she was started on cefepime.  She required NG tube.  She was transferred to [facility] where repeat MRI of brain with and without contrast, long-term EEG and lumbar puncture were negative for infection, autoimmune or paraneoplastic etiologies.   She was treated with 5 day course of IVIg.  Mentation started to improve and NG tube was removed.  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    She required PT/OT after discharge.  Since hospitalization, she reports decreased vision.  She saw Dr. Gershon Crane of ophthalmology in October who found no ocular cause for her vision loss and visual field testing reportedly full.  She reports no improvement in vision since hospitalization.  She now cannot ambulate independently and needs to use a walker.    In December 2022, she began having trouble seeing out of the right eye.  It looks like strands of hair over her visual field.  No associated ocular or head pain.  She reports history of "floaters" or black spots in the vision of either eye off and on for several years but nothing like this or lasting this long.  Sed rate and CRP were 10 and <1.0 respectively.  MRA of head and carotid ultrasound at the time showed no hemodynamically significant stenosis.  She saw her ophthalmologist, Dr. Gershon Crane, who again noted bilateral cataract but no evidence of retinal artery or venous occlusion.  He was sent to Dr. Zadie Rhine who did find a vitreous hemorrhage that was removed.  Vision in that eye improved but continues to have residual vision loss due to the cataract.     Workup included: 01/15/2020 MRI BRAIN W WO (personally reviewed):  Mild chronic small vessel disease but otherwise no acute intracranial abnormality.  01/16/2020 CSF:  Cell count 0, protein 49, glucose 124, culture negative, VDRL nonreactive, oligoclonal bands 0, IgG index 0.5, paraneoplastic/autoimmune  encephlaopathy panel (amphlphyasin ab, AGNA-1, ANNA-1/2/3, CRMP-5, PCA, NMDA-R ab, CASPR2, DPPX, GABA, CAD65, GFAP, IgLON5, LGl1, mGlucR1, NIF) negative 01/14/2020 SERUM:  Sed rate 35, CRP 0.7, thyroperoxidase Ab negative, B1 403.4, ammonia 26, Hgb A1c 7.1, WBC 9.8, HGB 8.2, HCT 26.8, PLT 276, Na 144, K 3.7,  glucose 281, BUN 40, Cr 2.08, t bili 0.5, ALP 72, AST 23, ALT 63, GFR 27  Underwent neuropsychological evaluation on 01/23/2022, which revealed mild neurocognitive disorder, primarily deficits with processing speed, which may be related to cerebrovascular disease, chronic kidney disease, anxiety and sequela of prior metabolic encephalopathy but did not seem consistent with a neurodegenerative disorder such as Alzheimer's, Lewy Body or FTD.    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   Borderline steroid-induced glaucoma, 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   OD, Early elevated intraocular pressure to 28 mm we will discontinue all topical medications from surgery in the right eye today   Bronchiectasis without complication [id number]   Cataract 07/03/2020   Chest pain at rest 01/30/2015   Chronic kidney disease, stage 4 (severe) 12/07/2020   Closed fracture of right tibial plateau 07/17/2021   Cyst of left kidney 03/10/2019   Needs repeated US 08/2019   Dysphagia, neurologic    Elevated d-dimer 12/20/2019   Facet arthritis of lumbar region 02/27/2019   Gastroesophageal reflux disease 08/26/2016   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   Hyperglycemia due to type 2 diabetes mellitus 12/07/2020   Hyperlipidemia    Hypertensive nephropathy 10/15/2017   Inadequate oral nutritional intake    Leukocytosis 12/22/2019   Long term (current) use of  insulin 12/07/2020   Low grade squamous intraepithelial lesion (LGSIL) on cervicovaginal cytologic smear 12/15/2018   Lumbago 02/27/2019   Macular atrophy, 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  Further history patient suffered possible Wernicke's encephalopathy the past, a type of the vitamin deficiencies, thiamine that can also affect the retina if there is significant damage which may not be recoverable.   Mild neurocognitive disorder due to multiple etiologies 01/23/2022   Osteoporosis 12/15/2018   Overweight with body mass index (BMI) 25.0-29.9 12/15/2018   PAD (peripheral artery disease) 06/06/2019   Pain in right hand 04/27/2020   Pancreatic cyst 06/06/2019   Polyneuropathy due to type 2 diabetes mellitus    Posterior vitreous detachment of left eye 08/05/2021   Proliferative diabetic retinopathy of right eye 08/22/2021   Likely contributory cause of nonclearing vitreous hemorrhage in the right eye now status post PRP likely to resolve in 2 quiescent PDR, post vitrectomy PRP   Pulmonary hypertension 02/04/2021   Right knee pain 07/10/2021   Sickle-cell trait 12/07/2020   Type II diabetes mellitus 03/10/2012   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   Vitreous hemorrhage of right eye 08/05/2021   Vitrectomy PRP 08-14-2021    MEDICATIONS: Current Outpatient Medications on File Prior to Visit  Medication Sig Dispense Refill   acetaminophen (TYLENOL) 325 MG tablet Take 1-2 tablets (325-650 mg total) by mouth every 4 (four) hours as needed for mild pain.     amLODipine (NORVASC) 2.5 MG tablet Take 2.5 mg by mouth daily.     atorvastatin (LIPITOR) 20 MG tablet Take 1 tablet (20 mg total) by mouth daily. 90 tablet 1   AZO-CRANBERRY PO Take 1 capsule by mouth daily at 6 (six) AM.     BD INSULIN SYRINGE U/F 31G X 5/16" 0.3 ML MISC See admin instructions. use with insulin     calcitRIOL  (ROCALTROL) 0.25 MCG capsule Take 0.25 mcg by mouth daily.     cetirizine (ZYRTEC) 10 MG tablet Take 10 mg by mouth daily.     [employer] VITAMIN B12 1000 MCG tablet TAKE 1 TABLET BY MOUTH EVERY DAY 30 tablet 0   diclofenac Sodium (VOLTAREN) 1 % GEL Apply 2 g topically 4 (four) times daily. Apply to left knee. (Patient taking differently: Apply 2 g topically 4 (four) times daily. Apply to left knee) 50 g 0   epoetin alfa (PROCRIT) 16109 UNIT/ML injection Every other week (Patient not taking: Reported on 05/08/2022)     FLUoxetine (PROZAC) 10 MG capsule TAKE 1 CAPSULE BY MOUTH EVERY DAY 30 capsule 0   folic acid (FOLVITE) 1 MG tablet TAKE 1 TABLET BY MOUTH EVERY DAY 90 tablet 1   gabapentin (NEURONTIN) 100 MG capsule TAKE 1 CAPSULE BY MOUTH AT BEDTIME (Patient taking differently: Take 100 mg by mouth at bedtime.) 90 capsule 2   insulin detemir (LEVEMIR FLEXTOUCH) 100 UNIT/ML FlexPen Inject 10 Units into the skin daily. 15 mL 3   Insulin Pen Needle (BD PEN NEEDLE NANO U/F) 32G X 4 MM MISC Use as directed with insulin pen 300 each 2   lidocaine (LIDODERM) 5 % Place 3 patches onto the skin daily. Remove & Discard patch within 12 hours or as directed by MD 30 patch 0   metoprolol tartrate (LOPRESSOR) 50 MG tablet Take 1 tablet (50 mg total) by mouth 2 (two) times daily. 180 tablet 1   Multiple Vitamins-Minerals (CENTRUM SILVER PO) Take by mouth daily.     OneTouch Delica Lancets [id number] MISC See admin instructions.     senna-docusate (SENOKOT-S) 8.6-50 MG tablet Take 2 tablets by mouth at bedtime as needed for mild constipation.     simethicone (MYLICON) 80 MG chewable tablet Chew 1 tablet (80 mg total) by mouth every 6 (six) hours as needed for flatulence. 30 tablet 0   sodium bicarbonate 650 MG tablet Take 1 tablet (650 mg total) by mouth 2 (two) times daily. 60 tablet 0   No current facility-administered medications on file prior to visit.    ALLERGIES: Allergies  Allergen Reactions   Cefepime Other (See  Comments)    Causes encephalitis - reported by [facility] 12/31/2019   Lactose Intolerance (Gi)     Per pt's daughter causes gas    FAMILY HISTORY: Family History  Problem Relation Age of Onset   Diabetes Mother    Heart disease Mother    Hypertension Mother    Stroke Mother    Diabetes Father    Heart disease Father    Diabetes Sister    Diabetes Brother    Hypertension Brother    Sickle cell anemia Brother    Diabetes Maternal Aunt    Diabetes Maternal Uncle    Diabetes Maternal Grandmother    Diabetes Maternal Grandfather  Sickle cell anemia Sister    Sickle cell anemia Sister    Healthy Son    Healthy Son    Healthy Daughter       Objective:  *** General: No acute distress.  Patient appears well-groomed.   Head:  Normocephalic/atraumatic Eyes:  Fundi examined but not visualized Neck: supple, no paraspinal tenderness, full range of motion Heart:  Regular rate and rhythm Neurological Exam: alert and oriented to person, place, and time.  Speech fluent and not dysarthric, language intact.  CN II-XII intact. Bulk and tone normal, muscle strength 5/5 throughout.  Sensation to light touch intact.  Deep tendon reflexes absent throughout, toes downgoing.  Finger to nose testing intact.  Gait unsteady, broad-based.  Requires walker.     Metta Clines, DO  CC: Glendale Chard, MD

## 2022-08-08 ENCOUNTER — Ambulatory Visit: Payer: Medicare Other | Admitting: Neurology

## 2022-08-08 ENCOUNTER — Encounter: Payer: Self-pay | Admitting: Neurology

## 2022-08-08 DIAGNOSIS — Z029 Encounter for administrative examinations, unspecified: Secondary | ICD-10-CM

## 2022-08-14 DIAGNOSIS — S52201D Unspecified fracture of shaft of right ulna, subsequent encounter for closed fracture with routine healing: Secondary | ICD-10-CM | POA: Diagnosis not present

## 2022-08-14 DIAGNOSIS — D62 Acute posthemorrhagic anemia: Secondary | ICD-10-CM | POA: Diagnosis not present

## 2022-08-14 DIAGNOSIS — I129 Hypertensive chronic kidney disease with stage 1 through stage 4 chronic kidney disease, or unspecified chronic kidney disease: Secondary | ICD-10-CM | POA: Diagnosis not present

## 2022-08-14 DIAGNOSIS — S52501D Unspecified fracture of the lower end of right radius, subsequent encounter for closed fracture with routine healing: Secondary | ICD-10-CM | POA: Diagnosis not present

## 2022-08-14 DIAGNOSIS — N184 Chronic kidney disease, stage 4 (severe): Secondary | ICD-10-CM | POA: Diagnosis not present

## 2022-08-14 DIAGNOSIS — S82251D Displaced comminuted fracture of shaft of right tibia, subsequent encounter for closed fracture with routine healing: Secondary | ICD-10-CM | POA: Diagnosis not present

## 2022-08-14 DIAGNOSIS — E1122 Type 2 diabetes mellitus with diabetic chronic kidney disease: Secondary | ICD-10-CM | POA: Diagnosis not present

## 2022-08-14 DIAGNOSIS — D631 Anemia in chronic kidney disease: Secondary | ICD-10-CM | POA: Diagnosis not present

## 2022-08-14 DIAGNOSIS — Z794 Long term (current) use of insulin: Secondary | ICD-10-CM | POA: Diagnosis not present

## 2022-08-15 DIAGNOSIS — E1122 Type 2 diabetes mellitus with diabetic chronic kidney disease: Secondary | ICD-10-CM | POA: Diagnosis not present

## 2022-08-15 DIAGNOSIS — N184 Chronic kidney disease, stage 4 (severe): Secondary | ICD-10-CM | POA: Diagnosis not present

## 2022-08-15 DIAGNOSIS — D631 Anemia in chronic kidney disease: Secondary | ICD-10-CM | POA: Diagnosis not present

## 2022-08-15 DIAGNOSIS — Z794 Long term (current) use of insulin: Secondary | ICD-10-CM | POA: Diagnosis not present

## 2022-08-15 DIAGNOSIS — I129 Hypertensive chronic kidney disease with stage 1 through stage 4 chronic kidney disease, or unspecified chronic kidney disease: Secondary | ICD-10-CM | POA: Diagnosis not present

## 2022-08-15 DIAGNOSIS — S52201D Unspecified fracture of shaft of right ulna, subsequent encounter for closed fracture with routine healing: Secondary | ICD-10-CM | POA: Diagnosis not present

## 2022-08-15 DIAGNOSIS — S82251D Displaced comminuted fracture of shaft of right tibia, subsequent encounter for closed fracture with routine healing: Secondary | ICD-10-CM | POA: Diagnosis not present

## 2022-08-15 DIAGNOSIS — D62 Acute posthemorrhagic anemia: Secondary | ICD-10-CM | POA: Diagnosis not present

## 2022-08-15 DIAGNOSIS — S52501D Unspecified fracture of the lower end of right radius, subsequent encounter for closed fracture with routine healing: Secondary | ICD-10-CM | POA: Diagnosis not present

## 2022-08-19 DIAGNOSIS — N184 Chronic kidney disease, stage 4 (severe): Secondary | ICD-10-CM | POA: Diagnosis not present

## 2022-08-19 DIAGNOSIS — D62 Acute posthemorrhagic anemia: Secondary | ICD-10-CM | POA: Diagnosis not present

## 2022-08-19 DIAGNOSIS — S52201D Unspecified fracture of shaft of right ulna, subsequent encounter for closed fracture with routine healing: Secondary | ICD-10-CM | POA: Diagnosis not present

## 2022-08-19 DIAGNOSIS — S82251D Displaced comminuted fracture of shaft of right tibia, subsequent encounter for closed fracture with routine healing: Secondary | ICD-10-CM | POA: Diagnosis not present

## 2022-08-19 DIAGNOSIS — I129 Hypertensive chronic kidney disease with stage 1 through stage 4 chronic kidney disease, or unspecified chronic kidney disease: Secondary | ICD-10-CM | POA: Diagnosis not present

## 2022-08-19 DIAGNOSIS — D631 Anemia in chronic kidney disease: Secondary | ICD-10-CM | POA: Diagnosis not present

## 2022-08-19 DIAGNOSIS — Z794 Long term (current) use of insulin: Secondary | ICD-10-CM | POA: Diagnosis not present

## 2022-08-19 DIAGNOSIS — E1122 Type 2 diabetes mellitus with diabetic chronic kidney disease: Secondary | ICD-10-CM | POA: Diagnosis not present

## 2022-08-19 DIAGNOSIS — S52501D Unspecified fracture of the lower end of right radius, subsequent encounter for closed fracture with routine healing: Secondary | ICD-10-CM | POA: Diagnosis not present

## 2022-08-21 ENCOUNTER — Ambulatory Visit: Payer: Medicare Other | Admitting: Podiatry

## 2022-08-21 DIAGNOSIS — D631 Anemia in chronic kidney disease: Secondary | ICD-10-CM | POA: Diagnosis not present

## 2022-08-21 DIAGNOSIS — Z794 Long term (current) use of insulin: Secondary | ICD-10-CM | POA: Diagnosis not present

## 2022-08-21 DIAGNOSIS — I129 Hypertensive chronic kidney disease with stage 1 through stage 4 chronic kidney disease, or unspecified chronic kidney disease: Secondary | ICD-10-CM | POA: Diagnosis not present

## 2022-08-21 DIAGNOSIS — L600 Ingrowing nail: Secondary | ICD-10-CM

## 2022-08-21 DIAGNOSIS — D62 Acute posthemorrhagic anemia: Secondary | ICD-10-CM | POA: Diagnosis not present

## 2022-08-21 DIAGNOSIS — S52501D Unspecified fracture of the lower end of right radius, subsequent encounter for closed fracture with routine healing: Secondary | ICD-10-CM | POA: Diagnosis not present

## 2022-08-21 DIAGNOSIS — N184 Chronic kidney disease, stage 4 (severe): Secondary | ICD-10-CM | POA: Diagnosis not present

## 2022-08-21 DIAGNOSIS — S82251D Displaced comminuted fracture of shaft of right tibia, subsequent encounter for closed fracture with routine healing: Secondary | ICD-10-CM | POA: Diagnosis not present

## 2022-08-21 DIAGNOSIS — E1122 Type 2 diabetes mellitus with diabetic chronic kidney disease: Secondary | ICD-10-CM | POA: Diagnosis not present

## 2022-08-21 DIAGNOSIS — S52201D Unspecified fracture of shaft of right ulna, subsequent encounter for closed fracture with routine healing: Secondary | ICD-10-CM | POA: Diagnosis not present

## 2022-08-21 NOTE — Progress Notes (Signed)
Subjective:   Patient ID: Miranda Roman, female   DOB: 77 y.o.   MRN: [id number]   HPI Patient presents with caregiver with painful ingrown toenail deformity left big toe medial border that is been sore for her and hard for her to wear shoe gear with comfortably   ROS      Objective:  Physical Exam  Neurovascular status found to be intact good digital perfusion noted A1c below 6 with an incurvated medial border left big toe sore when pressed making shoe gear difficult.  Patient has good digital perfusion well-oriented x 3     Assessment:  Ingrown toenail deformity left hallux medial border with pain no indications of infection     Plan:  Recommended correction explained procedure risk patient wants this fixed I explained procedure and risk patient reviewed with caregiver signed consent form and infiltrated the left hallux 60 mg like Marcaine mixture sterile prep done using sterile instrumentation removed the medial border exposed matrix applied phenol 3 applications 30 seconds followed by alcohol lavage sterile dressing gave instructions on soaks and to wear dressing 24 hours take it off earlier if throbbing were to occur and encouraged to call questions concerns

## 2022-08-21 NOTE — Patient Instructions (Signed)

## 2022-08-23 ENCOUNTER — Other Ambulatory Visit: Payer: Self-pay | Admitting: Podiatry

## 2022-08-23 MED ORDER — DOXYCYCLINE HYCLATE 100 MG PO TABS: 100.0000 mg | ORAL_TABLET | Freq: Two times a day (BID) | ORAL | 0 refills | Status: AC

## 2022-08-25 DIAGNOSIS — D62 Acute posthemorrhagic anemia: Secondary | ICD-10-CM | POA: Diagnosis not present

## 2022-08-25 DIAGNOSIS — N184 Chronic kidney disease, stage 4 (severe): Secondary | ICD-10-CM | POA: Diagnosis not present

## 2022-08-25 DIAGNOSIS — S82251D Displaced comminuted fracture of shaft of right tibia, subsequent encounter for closed fracture with routine healing: Secondary | ICD-10-CM | POA: Diagnosis not present

## 2022-08-25 DIAGNOSIS — D631 Anemia in chronic kidney disease: Secondary | ICD-10-CM | POA: Diagnosis not present

## 2022-08-25 DIAGNOSIS — S52201D Unspecified fracture of shaft of right ulna, subsequent encounter for closed fracture with routine healing: Secondary | ICD-10-CM | POA: Diagnosis not present

## 2022-08-25 DIAGNOSIS — S52501D Unspecified fracture of the lower end of right radius, subsequent encounter for closed fracture with routine healing: Secondary | ICD-10-CM | POA: Diagnosis not present

## 2022-08-25 DIAGNOSIS — Z794 Long term (current) use of insulin: Secondary | ICD-10-CM | POA: Diagnosis not present

## 2022-08-25 DIAGNOSIS — E1122 Type 2 diabetes mellitus with diabetic chronic kidney disease: Secondary | ICD-10-CM | POA: Diagnosis not present

## 2022-08-25 DIAGNOSIS — I129 Hypertensive chronic kidney disease with stage 1 through stage 4 chronic kidney disease, or unspecified chronic kidney disease: Secondary | ICD-10-CM | POA: Diagnosis not present

## 2022-08-26 ENCOUNTER — Telehealth: Payer: Self-pay | Admitting: Podiatry

## 2022-08-26 NOTE — Telephone Encounter (Signed)
Patient's daughter called and still having significant drainage and inflammation around nail avulsion site despite being on doxycycline for 2 days. Advised to keep soaking 2x daily and clear away any scabby yellow drainage, use cortisporin drops.

## 2022-08-27 DIAGNOSIS — E1165 Type 2 diabetes mellitus with hyperglycemia: Secondary | ICD-10-CM | POA: Diagnosis not present

## 2022-08-27 DIAGNOSIS — I10 Essential (primary) hypertension: Secondary | ICD-10-CM | POA: Diagnosis not present

## 2022-08-27 DIAGNOSIS — L570 Actinic keratosis: Secondary | ICD-10-CM | POA: Diagnosis not present

## 2022-08-27 DIAGNOSIS — Z794 Long term (current) use of insulin: Secondary | ICD-10-CM | POA: Diagnosis not present

## 2022-08-27 DIAGNOSIS — D229 Melanocytic nevi, unspecified: Secondary | ICD-10-CM | POA: Diagnosis not present

## 2022-08-27 DIAGNOSIS — R54 Age-related physical debility: Secondary | ICD-10-CM | POA: Diagnosis not present

## 2022-08-28 ENCOUNTER — Ambulatory Visit (INDEPENDENT_AMBULATORY_CARE_PROVIDER_SITE_OTHER): Payer: Medicare Other | Admitting: Podiatry

## 2022-08-28 ENCOUNTER — Encounter: Payer: Self-pay | Admitting: Podiatry

## 2022-08-28 ENCOUNTER — Encounter (HOSPITAL_COMMUNITY): Payer: Medicare Other

## 2022-08-28 DIAGNOSIS — R54 Age-related physical debility: Secondary | ICD-10-CM | POA: Diagnosis not present

## 2022-08-28 DIAGNOSIS — E782 Mixed hyperlipidemia: Secondary | ICD-10-CM | POA: Diagnosis not present

## 2022-08-28 DIAGNOSIS — E1165 Type 2 diabetes mellitus with hyperglycemia: Secondary | ICD-10-CM | POA: Diagnosis not present

## 2022-08-28 DIAGNOSIS — L03032 Cellulitis of left toe: Secondary | ICD-10-CM | POA: Diagnosis not present

## 2022-08-28 DIAGNOSIS — I10 Essential (primary) hypertension: Secondary | ICD-10-CM | POA: Diagnosis not present

## 2022-08-28 NOTE — Progress Notes (Signed)
Subjective:   Patient ID: Miranda Roman, female   DOB: 77 y.o.   MRN: [id number]   HPI Patient presents with caregiver with large blister on the lateral digit left hallux with patient soaking her foot after having ingrown toenail removal performed last week   ROS      Objective:  Physical Exam  Neurovascular status was found to intact large blister on the lateral side of the left inner phalangeal joint big toe localized no proximal edema erythema or drainage associated with this condition moderate pain with patient on antibiotic currently and nail healing on the right medial side well     Assessment:  Abscess of the left hallux lateral side measuring about 1.5 cm x 1 cm     Plan:  Sterile prep aspirated the area it was clear drainage from this and I was able to aspirate it entirely.  Applied sterile dressing with Silvadene instructed on soaks after several days of compression gave strict instructions if any proximal edema erythema or nonhealing were to occur or any systemic signs of infection to let us know immediately.  Should heal uneventfully

## 2022-09-03 ENCOUNTER — Ambulatory Visit: Payer: Self-pay

## 2022-09-03 DIAGNOSIS — S52501D Unspecified fracture of the lower end of right radius, subsequent encounter for closed fracture with routine healing: Secondary | ICD-10-CM | POA: Diagnosis not present

## 2022-09-03 DIAGNOSIS — S82251D Displaced comminuted fracture of shaft of right tibia, subsequent encounter for closed fracture with routine healing: Secondary | ICD-10-CM | POA: Diagnosis not present

## 2022-09-03 DIAGNOSIS — D62 Acute posthemorrhagic anemia: Secondary | ICD-10-CM | POA: Diagnosis not present

## 2022-09-03 DIAGNOSIS — N184 Chronic kidney disease, stage 4 (severe): Secondary | ICD-10-CM | POA: Diagnosis not present

## 2022-09-03 DIAGNOSIS — E1122 Type 2 diabetes mellitus with diabetic chronic kidney disease: Secondary | ICD-10-CM | POA: Diagnosis not present

## 2022-09-03 DIAGNOSIS — I129 Hypertensive chronic kidney disease with stage 1 through stage 4 chronic kidney disease, or unspecified chronic kidney disease: Secondary | ICD-10-CM | POA: Diagnosis not present

## 2022-09-03 DIAGNOSIS — S52201D Unspecified fracture of shaft of right ulna, subsequent encounter for closed fracture with routine healing: Secondary | ICD-10-CM | POA: Diagnosis not present

## 2022-09-03 DIAGNOSIS — D631 Anemia in chronic kidney disease: Secondary | ICD-10-CM | POA: Diagnosis not present

## 2022-09-03 DIAGNOSIS — Z794 Long term (current) use of insulin: Secondary | ICD-10-CM | POA: Diagnosis not present

## 2022-09-03 NOTE — Patient Instructions (Signed)
Visit Information  Thank you for taking time to visit with me today. Please don't hesitate to contact me if I can be of assistance to you.   Following are the goals we discussed today:   Goals Addressed             This Visit's Progress    COMPLETED: Care Coordination Activities       Care Coordination Interventions: SDoH screening performed - no acute resource challenged identified at this time Determined the patient does not have difficulty with medication costs Discussed patients daughter is her healthcare power of attorney, patient does not wish to make changed Education provided on the role of the care coordination team - no follow up desired at this time Encouraged the patient to contact her primary care provider as needed         If you are experiencing a Mental Health or [place] or need someone to talk to, please call 911  Patient verbalizes understanding of instructions and care plan provided today and agrees to view in [place]. Active MyChart status and patient understanding of how to access instructions and care plan via MyChart confirmed with patient.     No further follow up required: Please contact your primary care provider as needed.  Daneen Schick, BSW, CDP Social Worker, Certified Dementia Practitioner [facility]  Care Coordination [phone]

## 2022-09-03 NOTE — Patient Outreach (Signed)
  Care Coordination   Initial Visit Note   09/03/2022 Name: Miranda Roman MRN: [id number] DOB: 1945/11/10  Miranda Roman is a 77 y.o. year old female who sees Glendale Chard, MD for primary care. I spoke with  Sandy Salaam by phone today.  What matters to the patients health and wellness today?  No concerns, patient reports she is doing well    Goals Addressed             This Visit's Progress    COMPLETED: Care Coordination Activities       Care Coordination Interventions: SDoH screening performed - no acute resource challenged identified at this time Determined the patient does not have difficulty with medication costs Discussed patients daughter is her healthcare power of attorney, patient does not wish to make changed Education provided on the role of the care coordination team - no follow up desired at this time Encouraged the patient to contact her primary care provider as needed         SDOH assessments and interventions completed:  Yes  SDOH Interventions Today    Flowsheet Row Most Recent Value  SDOH Interventions   Food Insecurity Interventions Intervention Not Indicated  Housing Interventions Intervention Not Indicated  Transportation Interventions Intervention Not Indicated  Utilities Interventions Intervention Not Indicated  Financial Strain Interventions Intervention Not Indicated        Care Coordination Interventions:  Yes, provided   Interventions Today    Flowsheet Row Most Recent Value  Chronic Disease   Chronic disease during today's visit Hypertension (HTN), Diabetes  General Interventions   General Interventions Discussed/Reviewed General Interventions Discussed  Education Interventions   Education Provided Provided Education  Provided Verbal Education On Other        Follow up plan: No further intervention required.   Encounter Outcome:  Pt. Visit Completed   Daneen Schick, BSW, CDP Social Worker, Certified Dementia  Practitioner [facility]  Care Coordination [phone]

## 2022-09-05 DIAGNOSIS — R54 Age-related physical debility: Secondary | ICD-10-CM | POA: Diagnosis not present

## 2022-09-05 DIAGNOSIS — E1165 Type 2 diabetes mellitus with hyperglycemia: Secondary | ICD-10-CM | POA: Diagnosis not present

## 2022-09-05 DIAGNOSIS — N184 Chronic kidney disease, stage 4 (severe): Secondary | ICD-10-CM | POA: Diagnosis not present

## 2022-09-05 DIAGNOSIS — E1122 Type 2 diabetes mellitus with diabetic chronic kidney disease: Secondary | ICD-10-CM | POA: Diagnosis not present

## 2022-09-05 DIAGNOSIS — I129 Hypertensive chronic kidney disease with stage 1 through stage 4 chronic kidney disease, or unspecified chronic kidney disease: Secondary | ICD-10-CM | POA: Diagnosis not present

## 2022-09-05 DIAGNOSIS — E782 Mixed hyperlipidemia: Secondary | ICD-10-CM | POA: Diagnosis not present

## 2022-09-07 ENCOUNTER — Other Ambulatory Visit: Payer: Self-pay | Admitting: Internal Medicine

## 2022-09-08 DIAGNOSIS — D631 Anemia in chronic kidney disease: Secondary | ICD-10-CM | POA: Diagnosis not present

## 2022-09-08 DIAGNOSIS — S52201D Unspecified fracture of shaft of right ulna, subsequent encounter for closed fracture with routine healing: Secondary | ICD-10-CM | POA: Diagnosis not present

## 2022-09-08 DIAGNOSIS — S82251D Displaced comminuted fracture of shaft of right tibia, subsequent encounter for closed fracture with routine healing: Secondary | ICD-10-CM | POA: Diagnosis not present

## 2022-09-08 DIAGNOSIS — Z794 Long term (current) use of insulin: Secondary | ICD-10-CM | POA: Diagnosis not present

## 2022-09-08 DIAGNOSIS — N184 Chronic kidney disease, stage 4 (severe): Secondary | ICD-10-CM | POA: Diagnosis not present

## 2022-09-08 DIAGNOSIS — E1122 Type 2 diabetes mellitus with diabetic chronic kidney disease: Secondary | ICD-10-CM | POA: Diagnosis not present

## 2022-09-08 DIAGNOSIS — D62 Acute posthemorrhagic anemia: Secondary | ICD-10-CM | POA: Diagnosis not present

## 2022-09-08 DIAGNOSIS — I129 Hypertensive chronic kidney disease with stage 1 through stage 4 chronic kidney disease, or unspecified chronic kidney disease: Secondary | ICD-10-CM | POA: Diagnosis not present

## 2022-09-08 DIAGNOSIS — S52501D Unspecified fracture of the lower end of right radius, subsequent encounter for closed fracture with routine healing: Secondary | ICD-10-CM | POA: Diagnosis not present

## 2022-09-12 DIAGNOSIS — R54 Age-related physical debility: Secondary | ICD-10-CM | POA: Diagnosis not present

## 2022-09-12 DIAGNOSIS — E782 Mixed hyperlipidemia: Secondary | ICD-10-CM | POA: Diagnosis not present

## 2022-09-12 DIAGNOSIS — L910 Hypertrophic scar: Secondary | ICD-10-CM | POA: Diagnosis not present

## 2022-09-12 DIAGNOSIS — E1165 Type 2 diabetes mellitus with hyperglycemia: Secondary | ICD-10-CM | POA: Diagnosis not present

## 2022-09-12 DIAGNOSIS — E1122 Type 2 diabetes mellitus with diabetic chronic kidney disease: Secondary | ICD-10-CM | POA: Diagnosis not present

## 2022-09-12 DIAGNOSIS — N184 Chronic kidney disease, stage 4 (severe): Secondary | ICD-10-CM | POA: Diagnosis not present

## 2022-09-12 DIAGNOSIS — Z794 Long term (current) use of insulin: Secondary | ICD-10-CM | POA: Diagnosis not present

## 2022-09-12 DIAGNOSIS — E559 Vitamin D deficiency, unspecified: Secondary | ICD-10-CM | POA: Diagnosis not present

## 2022-09-12 DIAGNOSIS — I129 Hypertensive chronic kidney disease with stage 1 through stage 4 chronic kidney disease, or unspecified chronic kidney disease: Secondary | ICD-10-CM | POA: Diagnosis not present

## 2022-09-12 DIAGNOSIS — K219 Gastro-esophageal reflux disease without esophagitis: Secondary | ICD-10-CM | POA: Diagnosis not present

## 2022-10-01 DIAGNOSIS — H3530 Unspecified macular degeneration: Secondary | ICD-10-CM | POA: Diagnosis not present

## 2022-10-01 DIAGNOSIS — Z961 Presence of intraocular lens: Secondary | ICD-10-CM | POA: Diagnosis not present

## 2022-10-01 DIAGNOSIS — E119 Type 2 diabetes mellitus without complications: Secondary | ICD-10-CM | POA: Diagnosis not present

## 2022-10-01 DIAGNOSIS — Z794 Long term (current) use of insulin: Secondary | ICD-10-CM | POA: Diagnosis not present

## 2022-10-08 DIAGNOSIS — L299 Pruritus, unspecified: Secondary | ICD-10-CM | POA: Diagnosis not present

## 2022-10-14 DIAGNOSIS — N184 Chronic kidney disease, stage 4 (severe): Secondary | ICD-10-CM | POA: Diagnosis not present

## 2022-10-14 DIAGNOSIS — E872 Acidosis, unspecified: Secondary | ICD-10-CM | POA: Diagnosis not present

## 2022-10-14 DIAGNOSIS — N281 Cyst of kidney, acquired: Secondary | ICD-10-CM | POA: Diagnosis not present

## 2022-10-14 DIAGNOSIS — D631 Anemia in chronic kidney disease: Secondary | ICD-10-CM | POA: Diagnosis not present

## 2022-10-14 DIAGNOSIS — N2581 Secondary hyperparathyroidism of renal origin: Secondary | ICD-10-CM | POA: Diagnosis not present

## 2022-10-14 DIAGNOSIS — E1122 Type 2 diabetes mellitus with diabetic chronic kidney disease: Secondary | ICD-10-CM | POA: Diagnosis not present

## 2022-10-14 DIAGNOSIS — I129 Hypertensive chronic kidney disease with stage 1 through stage 4 chronic kidney disease, or unspecified chronic kidney disease: Secondary | ICD-10-CM | POA: Diagnosis not present

## 2022-10-28 ENCOUNTER — Telehealth: Payer: Self-pay | Admitting: Internal Medicine

## 2022-10-28 NOTE — Telephone Encounter (Signed)
Pt c/o of Chest Pain: STAT if CP now or developed within 24 hours  1. Are you having CP right now?   No  2. Are you experiencing any other symptoms (ex. SOB, nausea, vomiting, sweating)?   No  3. How long have you been experiencing CP? About a week  4. Is your CP continuous or coming and going?   Coming and going  5. Have you taken Nitroglycerin?   No  Daughter stated patient has been experiencing this since about a week. ?

## 2022-10-28 NOTE — Telephone Encounter (Signed)
Patient daughter states it's a burning pain under Left breast. Daughter states she has been fine. No SOB, no weakness.   She woke daughter up in the middle with chest pain, lasted less than a minute. No sweating, Nausea or vomiting. BG this AM 172 (normally runs 90's). BP and HR 3 weeks ago including EKG monitor.  Patient states "something feels off".  Patient not seen since 2021 and has appt. With NP on Thursday.  Advised that we cannot give recommendations as have not seen patient in so long.  Keep appt for Thursday.  If  chest pain returns and last more than 3-5 minutes she should seek medical attention.  She states understanding

## 2022-10-29 NOTE — Progress Notes (Unsigned)
Cardiology Clinic Note   Date: 10/30/2022 ID: Miranda, Roman [birth year], MRN [id number]  Primary Cardiologist:  Chrystie Nose, MD  Patient Profile    Miranda Roman is a 77 y.o. female who presents to the clinic today for evaluation of chest pain.   Past medical history significant for: Nonobstructive CAD. LHC 02/24/2001 (angina): Normal coronary arteries. LHC 11/04/2005 (angina): Smooth <10-20% narrowing ostial LCx. Nuclear stress test 08/10/2014: Low risk exercise myocardial perfusion study.  Global LV function mildly depressed with an EF of 49% without focal segmental wall motion abnormality.  Follow-up echo showed normal LV function (EF 60 to 65%). PAD. Aortic insufficiency/pulmonary hypertension. Echo 12/22/2019 (performed in [place]): EF 50 to 54%.  Mild AI.  Mild to moderate TR.  RVSP 65 mmHg.  Moderate pulmonary hypertension is present. Hypertension. Hyperlipidemia. Lipid panel 06/10/2022: LDL 62, HDL 37, TG 141, total 124. T2DM. CKD   History of Present Illness    Miranda Roman is a longtime patient of cardiology followed by Dr. Rennis Golden for the above outlined history.  Patient was last seen in the office by Dr. Rennis Golden on 05/10/2020.  At that time she was recovering from encephalopathy that she developed during a trip to [place].  She had an echo June 2021 at [facility] in [place] which showed low normal LV function, mild aortic insufficiency and mild to moderate tricuspid regurgitation.  RVSP 65 mmHg suggesting moderate pulmonary hypertension.  Plan to repeat echo, however it does not appear that it was ever repeated.  Has not been seen since.  Most recently, patient's daughter contacted triage on 10/28/2022 with complaints of chest pain: "Patient daughter states it's a burning pain under Left breast. Daughter states she has been fine. No SOB, no weakness. She woke daughter up in the middle with chest pain, lasted less than a minute. No sweating, Nausea or  vomiting. BG this AM 172 (normally runs 90's). BP and HR 3 weeks ago including EKG monitor.  Patient states "something feels off". Patient not seen since 2021 and has appt. With NP on Thursday.  Advised that we cannot give recommendations as have not seen patient in so long.  Keep appt for Thursday.  If  chest pain returns and last more than 3-5 minutes she should seek medical attention.  She states understanding."  Today, patient is accompanied by her daughter. Patient reports a one week history of intermittent burning pain under left breast. Patient's daughter gave patient Gas X but is unsure if it helped. Patient had no pain yesterday but had an episode during exam today that resolved during visit. Patient is not able to quantify how long the pain lasts or if anything brings it on/relieves it. She has not other symptoms with it. She ambulates with a walker at home. She is independent with personal hygiene and some ADLs. She has a home health aide that assists her throughout the day. Patient's daughter reports they have recently been doing some upper body exercises along with a video. Patient has occasional lower extremity edema toward the end of the day that is resolved in the morning. She is not overly strict with sodium restriction. Patient is going on a [place] cruise to [place] and [place] in June.     ROS: All other systems reviewed and are otherwise negative except as noted in History of Present Illness.  Studies Reviewed    ECG personally reviewed by me today: NSR, rate 62 bpm.   No significant changes from 05/16/2022.  Physical Exam    VS:  BP 128/74 (BP Location: Left Arm, Patient Position: Sitting, Cuff Size: Normal)   Pulse 62   Ht  (1.626 m)   Wt 168 lb (76.2 kg)   BMI 28.84 kg/m  , BMI Body mass index is 28.84 kg/m.  GEN: Well nourished, well developed, in no acute distress. Neck: No JVD or carotid bruits. Cardiac:  RRR. No murmurs. No rubs or gallops.    Respiratory:  Respirations regular and unlabored. Clear to auscultation without rales, wheezing or rhonchi. GI: Soft, nontender, nondistended. Extremities: Radials/DP/PT 2+ and equal bilaterally. No clubbing or cyanosis. No edema.  Skin: Warm and dry, no rash. Neuro: Strength intact.  Assessment & Plan    Nonobstructive CAD/atypical chest pain.  LHC May 2007 showed <10-20% ostial LCx.  Patient reports a 1 week history of burning under left breast. She is unable to quantify how long it lasts or if anything makes it worse or better. Patient had an episode of pain while in the office and resolved before the end of her visit. Daughter reports she has been performing new upper body exercises with her home health aide. EKG showed NSR, rage 62 bpm. Pain is reproducible to palpation. No rash observed. With shared decision making, will hold off on ischemic evaluation for atypical/muscular chest pain. Discussed contacting the office if pain persists or worsens. Continue atorvastatin, metoprolol.  Aortic insufficiency/pulmonary hypertension.  Echo performed in [place] during hospitalization for encephalopathy June 2021 showed low normal LV function, mild AI, mild to moderate TR, RVSP 65 mmHg, moderate pulmonary hypertension.  Patient denies shortness of breath, DOE, lightheadedness, presyncope or syncope. Will get a repeat echo.  Hypertension.  BP today 128/74. Patient denies headaches or dizziness.  Continue amlodipine, metoprolol. Hyperlipidemia. LDL December 2023 62, at goal. Continue atorvastatin.   Disposition: Echo. Return in 3 months or sooner as needed.          Signed, Etta Grandchild. Roxie Gueye, DNP, NP-C

## 2022-10-30 ENCOUNTER — Encounter: Payer: Self-pay | Admitting: Student

## 2022-10-30 ENCOUNTER — Ambulatory Visit: Payer: Medicare Other | Attending: Student | Admitting: Student

## 2022-10-30 VITALS — BP 128/74 | HR 62 | Ht 64.0 in | Wt 168.0 lb

## 2022-10-30 DIAGNOSIS — E785 Hyperlipidemia, unspecified: Secondary | ICD-10-CM | POA: Diagnosis not present

## 2022-10-30 DIAGNOSIS — R0789 Other chest pain: Secondary | ICD-10-CM

## 2022-10-30 DIAGNOSIS — I2510 Atherosclerotic heart disease of native coronary artery without angina pectoris: Secondary | ICD-10-CM | POA: Diagnosis not present

## 2022-10-30 DIAGNOSIS — I351 Nonrheumatic aortic (valve) insufficiency: Secondary | ICD-10-CM

## 2022-10-30 DIAGNOSIS — I10 Essential (primary) hypertension: Secondary | ICD-10-CM | POA: Diagnosis not present

## 2022-10-30 NOTE — Patient Instructions (Signed)
Medication Instructions:  No Changes *If you need a refill on your cardiac medications before your next appointment, please call your pharmacy*  Lab Work: None Ordered   Testing/Procedures: Your physician has requested that you have an echocardiogram. Echocardiography is a painless test that uses sound waves to create images of your heart. It provides your doctor with information about the size and shape of your heart and how well your heart's chambers and valves are working. This procedure takes approximately one hour. There are no restrictions for this procedure. Please do NOT wear cologne, perfume, aftershave, or lotions (deodorant is allowed). Please arrive 15 minutes prior to your appointment time.    Follow-Up: At [facility], you and your health needs are our priority.  As part of our continuing mission to provide you with exceptional heart care, we have created designated Provider Care Teams.  These Care Teams include your primary Cardiologist (physician) and Advanced Practice Providers (APPs -  Physician Assistants and Nurse Practitioners) who all work together to provide you with the care you need, when you need it.  We recommend signing up for the patient portal called "MyChart".  Sign up information is provided on this After Visit Summary.  MyChart is used to connect with patients for Virtual Visits (Telemedicine).  Patients are able to view lab/test results, encounter notes, upcoming appointments, etc.  Non-urgent messages can be sent to your provider as well.   To learn more about what you can do with MyChart, go to [email or web address].    Your next appointment:   3-4 month(s)  Provider:   Chrystie Nose, MD

## 2022-11-01 ENCOUNTER — Encounter: Payer: Self-pay | Admitting: Internal Medicine

## 2022-11-03 ENCOUNTER — Ambulatory Visit (INDEPENDENT_AMBULATORY_CARE_PROVIDER_SITE_OTHER): Payer: Medicare Other | Admitting: Podiatry

## 2022-11-03 ENCOUNTER — Encounter: Payer: Self-pay | Admitting: Podiatry

## 2022-11-03 ENCOUNTER — Ambulatory Visit: Payer: Medicare Other | Admitting: Podiatry

## 2022-11-03 VITALS — BP 143/77

## 2022-11-03 DIAGNOSIS — B351 Tinea unguium: Secondary | ICD-10-CM | POA: Diagnosis not present

## 2022-11-03 DIAGNOSIS — M79674 Pain in right toe(s): Secondary | ICD-10-CM | POA: Diagnosis not present

## 2022-11-03 DIAGNOSIS — L84 Corns and callosities: Secondary | ICD-10-CM | POA: Diagnosis not present

## 2022-11-03 DIAGNOSIS — E1122 Type 2 diabetes mellitus with diabetic chronic kidney disease: Secondary | ICD-10-CM | POA: Diagnosis not present

## 2022-11-03 DIAGNOSIS — M79675 Pain in left toe(s): Secondary | ICD-10-CM

## 2022-11-03 DIAGNOSIS — N184 Chronic kidney disease, stage 4 (severe): Secondary | ICD-10-CM

## 2022-11-03 NOTE — Progress Notes (Unsigned)
  Subjective:  Patient ID: Miranda Roman, female    DOB: 1945/11/03,  MRN: [id number]  Miranda Roman presents to clinic today for {jgcomplaint:23593}  Chief Complaint  Patient presents with   Nail Problem    [facility] BS-did not check today A1C-5.9 PCP- Allyne Gee PCP VST-06/2023   New problem(s): None. {jgcomplaint:23593}  PCP is Dorothyann Peng, MD.  Allergies  Allergen Reactions   Cefepime Other (See Comments)    Causes encephalitis - reported by [facility] 12/31/2019   Lactose Intolerance (Gi)     Per pt's daughter causes gas    Review of Systems: Negative except as noted in the HPI.  Objective: No changes noted in today's physical examination. Vitals:   11/03/22 1608  BP: (!) 143/77   Miranda Roman is a pleasant 77 y.o. female {jgbodyhabitus:24098} AAO x 3.  Vascular:  Capillary refill time to digits immediate b/l. Palpable pedal pulses b/l LE. Pedal hair absent. Lower extremity skin temperature gradient within normal limits.  Dermatological:  Skin warm and supple b/l lower extremities. No open wounds b/l lower extremities. No interdigital macerations b/l lower extremities. Toenails 1-5 b/l elongated, discolored, dystrophic, thickened, crumbly with subungual debris and tenderness to dorsal palpation.   Hyperkeratotic lesion(s) submet head 5 b/l and submet head 1 right foot. No erythema, no edema, no drainage, no fluctuance.   Musculoskeletal:  Normal muscle strength 5/5 to all lower extremity muscle groups bilaterally. Tailor's bunion deformity noted b/l lower extremities.  Neurological:  Protective sensation intact 5/5 intact bilaterally with 10g monofilament b/l. Vibratory sensation intact b/l. Assessment/Plan: No diagnosis found.  No orders of the defined types were placed in this encounter.   None {Jgplan:23602::"-Patient/POA to call should there be question/concern in the interim."}   Return in about 3 months (around 02/02/2023).  Freddie Breech,  DPM

## 2022-11-04 ENCOUNTER — Ambulatory Visit: Payer: Medicare Other | Admitting: Podiatry

## 2022-11-10 ENCOUNTER — Ambulatory Visit: Payer: Self-pay | Admitting: Internal Medicine

## 2022-11-13 DIAGNOSIS — E1165 Type 2 diabetes mellitus with hyperglycemia: Secondary | ICD-10-CM | POA: Diagnosis not present

## 2022-11-13 DIAGNOSIS — E782 Mixed hyperlipidemia: Secondary | ICD-10-CM | POA: Diagnosis not present

## 2022-11-14 ENCOUNTER — Other Ambulatory Visit: Payer: Self-pay

## 2022-11-14 ENCOUNTER — Emergency Department (HOSPITAL_COMMUNITY): Payer: Medicare Other

## 2022-11-14 ENCOUNTER — Emergency Department (HOSPITAL_COMMUNITY)
Admission: EM | Admit: 2022-11-14 | Discharge: 2022-11-14 | Disposition: A | Discharge status: Home / Self Care | Payer: Medicare Other | Source: Home / Self Care | Attending: Emergency Medicine | Admitting: Emergency Medicine

## 2022-11-14 DIAGNOSIS — R112 Nausea with vomiting, unspecified: Secondary | ICD-10-CM

## 2022-11-14 DIAGNOSIS — R6889 Other general symptoms and signs: Secondary | ICD-10-CM | POA: Diagnosis not present

## 2022-11-14 DIAGNOSIS — N3000 Acute cystitis without hematuria: Secondary | ICD-10-CM | POA: Insufficient documentation

## 2022-11-14 DIAGNOSIS — N184 Chronic kidney disease, stage 4 (severe): Secondary | ICD-10-CM | POA: Insufficient documentation

## 2022-11-14 DIAGNOSIS — J9601 Acute respiratory failure with hypoxia: Secondary | ICD-10-CM | POA: Diagnosis not present

## 2022-11-14 DIAGNOSIS — R410 Disorientation, unspecified: Secondary | ICD-10-CM | POA: Diagnosis not present

## 2022-11-14 DIAGNOSIS — I129 Hypertensive chronic kidney disease with stage 1 through stage 4 chronic kidney disease, or unspecified chronic kidney disease: Secondary | ICD-10-CM | POA: Insufficient documentation

## 2022-11-14 DIAGNOSIS — A419 Sepsis, unspecified organism: Secondary | ICD-10-CM | POA: Diagnosis not present

## 2022-11-14 DIAGNOSIS — D65 Disseminated intravascular coagulation [defibrination syndrome]: Secondary | ICD-10-CM | POA: Diagnosis not present

## 2022-11-14 DIAGNOSIS — I272 Pulmonary hypertension, unspecified: Secondary | ICD-10-CM | POA: Diagnosis not present

## 2022-11-14 DIAGNOSIS — N200 Calculus of kidney: Secondary | ICD-10-CM | POA: Diagnosis not present

## 2022-11-14 DIAGNOSIS — E119 Type 2 diabetes mellitus without complications: Secondary | ICD-10-CM | POA: Insufficient documentation

## 2022-11-14 DIAGNOSIS — J9811 Atelectasis: Secondary | ICD-10-CM | POA: Diagnosis not present

## 2022-11-14 DIAGNOSIS — R579 Shock, unspecified: Secondary | ICD-10-CM | POA: Diagnosis not present

## 2022-11-14 DIAGNOSIS — R578 Other shock: Secondary | ICD-10-CM | POA: Diagnosis not present

## 2022-11-14 DIAGNOSIS — I469 Cardiac arrest, cause unspecified: Secondary | ICD-10-CM | POA: Diagnosis not present

## 2022-11-14 DIAGNOSIS — R7401 Elevation of levels of liver transaminase levels: Secondary | ICD-10-CM | POA: Diagnosis not present

## 2022-11-14 DIAGNOSIS — G9341 Metabolic encephalopathy: Secondary | ICD-10-CM | POA: Diagnosis not present

## 2022-11-14 DIAGNOSIS — R7881 Bacteremia: Secondary | ICD-10-CM | POA: Diagnosis not present

## 2022-11-14 DIAGNOSIS — Z66 Do not resuscitate: Secondary | ICD-10-CM | POA: Diagnosis not present

## 2022-11-14 DIAGNOSIS — I10 Essential (primary) hypertension: Secondary | ICD-10-CM

## 2022-11-14 DIAGNOSIS — D649 Anemia, unspecified: Secondary | ICD-10-CM | POA: Diagnosis not present

## 2022-11-14 DIAGNOSIS — D5744 Sickle-cell thalassemia beta plus without crisis: Secondary | ICD-10-CM | POA: Diagnosis present

## 2022-11-14 DIAGNOSIS — D631 Anemia in chronic kidney disease: Secondary | ICD-10-CM | POA: Diagnosis not present

## 2022-11-14 DIAGNOSIS — E875 Hyperkalemia: Secondary | ICD-10-CM | POA: Diagnosis not present

## 2022-11-14 DIAGNOSIS — E1151 Type 2 diabetes mellitus with diabetic peripheral angiopathy without gangrene: Secondary | ICD-10-CM | POA: Diagnosis not present

## 2022-11-14 DIAGNOSIS — R54 Age-related physical debility: Secondary | ICD-10-CM | POA: Diagnosis not present

## 2022-11-14 DIAGNOSIS — E113591 Type 2 diabetes mellitus with proliferative diabetic retinopathy without macular edema, right eye: Secondary | ICD-10-CM | POA: Diagnosis not present

## 2022-11-14 DIAGNOSIS — Z4659 Encounter for fitting and adjustment of other gastrointestinal appliance and device: Secondary | ICD-10-CM | POA: Diagnosis not present

## 2022-11-14 DIAGNOSIS — Z515 Encounter for palliative care: Secondary | ICD-10-CM | POA: Diagnosis not present

## 2022-11-14 DIAGNOSIS — E782 Mixed hyperlipidemia: Secondary | ICD-10-CM | POA: Diagnosis not present

## 2022-11-14 DIAGNOSIS — E785 Hyperlipidemia, unspecified: Secondary | ICD-10-CM | POA: Diagnosis not present

## 2022-11-14 DIAGNOSIS — Z1611 Resistance to penicillins: Secondary | ICD-10-CM | POA: Diagnosis not present

## 2022-11-14 DIAGNOSIS — R6521 Severe sepsis with septic shock: Secondary | ICD-10-CM | POA: Diagnosis not present

## 2022-11-14 DIAGNOSIS — G9340 Encephalopathy, unspecified: Secondary | ICD-10-CM | POA: Diagnosis not present

## 2022-11-14 DIAGNOSIS — Z79899 Other long term (current) drug therapy: Secondary | ICD-10-CM | POA: Insufficient documentation

## 2022-11-14 DIAGNOSIS — E872 Acidosis, unspecified: Secondary | ICD-10-CM | POA: Diagnosis not present

## 2022-11-14 DIAGNOSIS — N281 Cyst of kidney, acquired: Secondary | ICD-10-CM | POA: Diagnosis not present

## 2022-11-14 DIAGNOSIS — K7200 Acute and subacute hepatic failure without coma: Secondary | ICD-10-CM | POA: Diagnosis not present

## 2022-11-14 DIAGNOSIS — R0689 Other abnormalities of breathing: Secondary | ICD-10-CM | POA: Diagnosis not present

## 2022-11-14 DIAGNOSIS — Z743 Need for continuous supervision: Secondary | ICD-10-CM | POA: Diagnosis not present

## 2022-11-14 DIAGNOSIS — Z794 Long term (current) use of insulin: Secondary | ICD-10-CM | POA: Insufficient documentation

## 2022-11-14 DIAGNOSIS — N179 Acute kidney failure, unspecified: Secondary | ICD-10-CM | POA: Diagnosis not present

## 2022-11-14 DIAGNOSIS — D500 Iron deficiency anemia secondary to blood loss (chronic): Secondary | ICD-10-CM | POA: Diagnosis not present

## 2022-11-14 DIAGNOSIS — R739 Hyperglycemia, unspecified: Secondary | ICD-10-CM | POA: Diagnosis not present

## 2022-11-14 DIAGNOSIS — A4159 Other Gram-negative sepsis: Secondary | ICD-10-CM | POA: Diagnosis not present

## 2022-11-14 DIAGNOSIS — N189 Chronic kidney disease, unspecified: Secondary | ICD-10-CM | POA: Diagnosis not present

## 2022-11-14 DIAGNOSIS — K8020 Calculus of gallbladder without cholecystitis without obstruction: Secondary | ICD-10-CM | POA: Diagnosis not present

## 2022-11-14 DIAGNOSIS — E1122 Type 2 diabetes mellitus with diabetic chronic kidney disease: Secondary | ICD-10-CM | POA: Diagnosis not present

## 2022-11-14 LAB — CBC
HCT: 34.5 % — ABNORMAL LOW (ref 36.0–46.0)
Hemoglobin: 10.7 g/dL — ABNORMAL LOW (ref 12.0–15.0)
MCH: 23.1 pg — ABNORMAL LOW (ref 26.0–34.0)
MCHC: 31 g/dL (ref 30.0–36.0)
MCV: 74.5 fL — ABNORMAL LOW (ref 80.0–100.0)
Platelets: 166 10*3/uL (ref 150–400)
RBC: 4.63 MIL/uL (ref 3.87–5.11)
RDW: 15.1 % (ref 11.5–15.5)
WBC: 11 10*3/uL — ABNORMAL HIGH (ref 4.0–10.5)
nRBC: 0 % (ref 0.0–0.2)

## 2022-11-14 LAB — URINALYSIS, W/ REFLEX TO CULTURE (INFECTION SUSPECTED)
Bilirubin Urine: NEGATIVE
Glucose, UA: NEGATIVE mg/dL
Ketones, ur: NEGATIVE mg/dL
Nitrite: NEGATIVE
Protein, ur: 100 mg/dL — AB
Specific Gravity, Urine: 1.009 (ref 1.005–1.030)
pH: 5 (ref 5.0–8.0)

## 2022-11-14 LAB — COMPREHENSIVE METABOLIC PANEL
ALT: 39 U/L (ref 0–44)
AST: 23 U/L (ref 15–41)
Albumin: 3.8 g/dL (ref 3.5–5.0)
Alkaline Phosphatase: 69 U/L (ref 38–126)
Anion gap: 10 (ref 5–15)
BUN: 43 mg/dL — ABNORMAL HIGH (ref 8–23)
CO2: 20 mmol/L — ABNORMAL LOW (ref 22–32)
Calcium: 9.1 mg/dL (ref 8.9–10.3)
Chloride: 113 mmol/L — ABNORMAL HIGH (ref 98–111)
Creatinine, Ser: 2.66 mg/dL — ABNORMAL HIGH (ref 0.44–1.00)
GFR, Estimated: 18 mL/min — ABNORMAL LOW (ref >60)
Glucose, Bld: 234 mg/dL — ABNORMAL HIGH (ref 70–99)
Potassium: 4 mmol/L (ref 3.5–5.1)
Sodium: 143 mmol/L (ref 135–145)
Total Bilirubin: 0.6 mg/dL (ref 0.3–1.2)
Total Protein: 7.1 g/dL (ref 6.5–8.1)

## 2022-11-14 LAB — LACTIC ACID, PLASMA
Lactic Acid, Venous: 2.7 mmol/L (ref 0.5–1.9)
Lactic Acid, Venous: 1.7 mmol/L (ref 0.5–1.9)

## 2022-11-14 LAB — I-STAT CHEM 8, ED
BUN: 47 mg/dL — ABNORMAL HIGH (ref 8–23)
Calcium, Ion: 1.23 mmol/L (ref 1.15–1.40)
Chloride: 113 mmol/L — ABNORMAL HIGH (ref 98–111)
Creatinine, Ser: 3 mg/dL — ABNORMAL HIGH (ref 0.44–1.00)
Glucose, Bld: 228 mg/dL — ABNORMAL HIGH (ref 70–99)
HCT: 34 % — ABNORMAL LOW (ref 36.0–46.0)
Hemoglobin: 11.6 g/dL — ABNORMAL LOW (ref 12.0–15.0)
Potassium: 4.5 mmol/L (ref 3.5–5.1)
Sodium: 144 mmol/L (ref 135–145)
TCO2: 22 mmol/L (ref 22–32)

## 2022-11-14 LAB — LIPASE, BLOOD: Lipase: 62 U/L — ABNORMAL HIGH (ref 11–51)

## 2022-11-14 MED ORDER — ONDANSETRON HCL 4 MG/2ML IJ SOLN
4.0000 mg | Freq: Once | INTRAMUSCULAR | Status: AC
  Administered 2022-11-14: 4 mg via INTRAVENOUS

## 2022-11-14 MED ORDER — ONDANSETRON HCL 4 MG/2ML IJ SOLN
INTRAMUSCULAR | Status: AC
  Filled 2022-11-14: qty 2

## 2022-11-14 MED ORDER — ONDANSETRON 4 MG PO TBDP: 4.0000 mg | ORAL_TABLET | Freq: Three times a day (TID) | ORAL | 0 refills | Status: DC | PRN

## 2022-11-14 MED ORDER — METOCLOPRAMIDE HCL 5 MG/ML IJ SOLN
10.0000 mg | Freq: Once | INTRAMUSCULAR | Status: AC
  Administered 2022-11-14: 10 mg via INTRAVENOUS
  Filled 2022-11-14: qty 2

## 2022-11-14 MED ORDER — CEFPODOXIME PROXETIL 100 MG PO TABS: 100.0000 mg | ORAL_TABLET | Freq: Two times a day (BID) | ORAL | 0 refills | Status: DC

## 2022-11-14 MED ORDER — METOPROLOL TARTRATE 25 MG PO TABS
50.0000 mg | ORAL_TABLET | Freq: Once | ORAL | Status: AC
  Administered 2022-11-14: 50 mg via ORAL
  Filled 2022-11-14: qty 2

## 2022-11-14 MED ORDER — SODIUM CHLORIDE 0.9 % IV SOLN
1.0000 g | Freq: Once | INTRAVENOUS | Status: AC
  Administered 2022-11-14: 1 g via INTRAVENOUS
  Filled 2022-11-14: qty 10

## 2022-11-14 MED ORDER — IOHEXOL 300 MG/ML  SOLN: 100.0000 mL | Freq: Once | INTRAMUSCULAR | Status: DC | PRN

## 2022-11-14 MED ORDER — AMLODIPINE BESYLATE 5 MG PO TABS
2.5000 mg | ORAL_TABLET | Freq: Once | ORAL | Status: AC
  Administered 2022-11-14: 2.5 mg via ORAL
  Filled 2022-11-14: qty 1

## 2022-11-14 MED ORDER — METOCLOPRAMIDE HCL 10 MG PO TABS: 10.0000 mg | ORAL_TABLET | Freq: Four times a day (QID) | ORAL | 0 refills | Status: DC

## 2022-11-14 NOTE — ED Notes (Signed)
Dr. Jearld Fenton notified of lactic of 2.7

## 2022-11-14 NOTE — ED Notes (Signed)
Pt vomited 2 minutes after BP med administration. Dr. Jearld Fenton notified

## 2022-11-14 NOTE — ED Notes (Signed)
Pt had another episode of vomiting 

## 2022-11-14 NOTE — ED Notes (Signed)
Pt states relief of nausea, pt given sprite for PO challenge

## 2022-11-14 NOTE — ED Notes (Signed)
Dr. Jearld Fenton notified of pt blood pressure

## 2022-11-14 NOTE — ED Triage Notes (Signed)
Pt arrived via POV. C/o N/V and  lower back and gen abd pain since 1900 last night.  Pt unable to take HTN meds this AM. BP: 199/90 in triage.  AOx4

## 2022-11-14 NOTE — ED Notes (Signed)
Now can not flush IV.  Will need IV team for IV.  Have not removed this one at this time incase I could get another blood draw from it if necessary.

## 2022-11-14 NOTE — ED Notes (Signed)
V/o from Dr. Jearld Fenton to give pt home BP meds

## 2022-11-14 NOTE — ED Provider Notes (Signed)
[facility] EMERGENCY DEPARTMENT AT [facility] Provider Note   CSN: [id number] Arrival date & time: 11/14/22  0800     History  Chief Complaint  Patient presents with   Back Pain   Emesis   Hypertension    Miranda Roman is a 77 y.o. female with T2DM, HLD, HTN, history of gastric bypass, uterine leiomyoma, peripheral artery disease, chronic kidney disease stage IV, pulmonary hypertension, beta thalassemia, who presents with N/V and bilateral lumbar lower back and generalized abdominal pain since 1900 last night. Unable to quantify on scale from 1-10. Intermittent. She has had N/V so severe she cannot tolerate PO, was unable to take her BP meds this AM including metoprolol/amlodipine. Presents with daughter who provides additional history. No f/c, CP, cough, SOB, diarrhea/constipation, melena/hematochezia, urinary symptoms, vaginal sxs. Last BM was yesterday which was normal for her. Denies abdominal bloating/distension. Also denies HA, visual changes, N/T, asymmetric weakness.     Back Pain Emesis Hypertension       Home Medications Prior to Admission medications   Medication Sig Start Date End Date Taking? Authorizing Provider  cefpodoxime (VANTIN) 100 MG tablet Take 1 tablet (100 mg total) by mouth 2 (two) times daily for 7 days. 11/14/22 11/21/22 Yes Loetta Rough, MD  ondansetron (ZOFRAN-ODT) 4 MG disintegrating tablet Take 1 tablet (4 mg total) by mouth every 8 (eight) hours as needed for nausea or vomiting. 11/14/22  Yes Loetta Rough, MD  acetaminophen (TYLENOL) 325 MG tablet Take 1-2 tablets (325-650 mg total) by mouth every 4 (four) hours as needed for mild pain. 06/06/22   Setzer, Lynnell Jude, PA-C  amLODipine (NORVASC) 2.5 MG tablet Take 2.5 mg by mouth daily. 09/13/20   [provider]  atorvastatin (LIPITOR) 20 MG tablet Take 1 tablet (20 mg total) by mouth daily. 07/08/22   Dorothyann Peng, MD  AZO-CRANBERRY PO Take 1 capsule by mouth daily at 6 (six)  AM.    [provider]  BD INSULIN SYRINGE U/F 31G X 5/16" 0.3 ML MISC See admin instructions. use with insulin 03/17/20   [provider]  calcitRIOL (ROCALTROL) 0.25 MCG capsule Take 0.25 mcg by mouth daily. 08/27/20   [provider]  cetirizine (ZYRTEC) 10 MG tablet Take 10 mg by mouth daily.    [provider]  [employer] VITAMIN B12 1000 MCG tablet TAKE 1 TABLET BY MOUTH EVERY DAY 08/04/22   Dorothyann Peng, MD  diclofenac Sodium (VOLTAREN) 1 % GEL Apply 2 g topically 4 (four) times daily. Apply to left knee. Patient taking differently: Apply 2 g topically 4 (four) times daily. Apply to left knee 09/12/21   Arnette Felts, FNP  epoetin alfa (PROCRIT) 08657 UNIT/ML injection Every other week    [provider]  FLUoxetine (PROZAC) 10 MG capsule TAKE 1 CAPSULE BY MOUTH EVERY DAY 09/08/22   Dorothyann Peng, MD  folic acid (FOLVITE) 1 MG tablet TAKE 1 TABLET BY MOUTH EVERY DAY 08/01/22   Dorothyann Peng, MD  gabapentin (NEURONTIN) 100 MG capsule TAKE 1 CAPSULE BY MOUTH AT BEDTIME Patient taking differently: Take 100 mg by mouth at bedtime. 02/24/22   Dorothyann Peng, MD  insulin detemir (LEVEMIR FLEXTOUCH) 100 UNIT/ML FlexPen Inject 10 Units into the skin daily. 06/06/22   Setzer, Lynnell Jude, PA-C  Insulin Pen Needle (BD PEN NEEDLE NANO U/F) 32G X 4 MM MISC Use as directed with insulin pen 06/23/22   Dorothyann Peng, MD  lidocaine (LIDODERM) 5 % Place 3 patches onto  the skin daily. Remove & Discard patch within 12 hours or as directed by MD 06/06/22   Valetta Fuller, Lynnell Jude, PA-C  metoprolol tartrate (LOPRESSOR) 50 MG tablet Take 1 tablet (50 mg total) by mouth 2 (two) times daily. 07/08/22   Dorothyann Peng, MD  Multiple Vitamins-Minerals (CENTRUM SILVER PO) Take by mouth daily.    [provider]  OneTouch Delica Lancets 30G MISC See admin instructions. 02/09/21   [provider]  senna-docusate (SENOKOT-S) 8.6-50 MG tablet Take 2 tablets by mouth at bedtime as needed  for mild constipation. 06/06/22   Setzer, Lynnell Jude, PA-C  simethicone (MYLICON) 80 MG chewable tablet Chew 1 tablet (80 mg total) by mouth every 6 (six) hours as needed for flatulence. 06/06/22   Setzer, Lynnell Jude, PA-C  sodium bicarbonate 650 MG tablet Take 1 tablet (650 mg total) by mouth 2 (two) times daily. 06/06/22   Setzer, Lynnell Jude, PA-C      Allergies    Cefepime and Lactose intolerance (gi)    Review of Systems   Review of Systems  Gastrointestinal:  Positive for vomiting.  Musculoskeletal:  Positive for back pain.   Review of systems Negative for f/c.  A 10 point review of systems was performed and is negative unless otherwise reported in HPI.  Physical Exam Updated Vital Signs BP (!) 202/108   Pulse 74   Temp 98.5 F (36.9 C) (Oral)   Resp 20   Ht 5\' 4"  (1.626 m)   Wt 76.2 kg   SpO2 99%   BMI 28.84 kg/m  Physical Exam General: Normal appearing female, lying in bed.  HEENT: Sclera anicteric, MMM, trachea midline.  Cardiology: RRR, no murmurs/rubs/gallops. BL radial and DP pulses equal bilaterally.  Resp: Normal respiratory rate and effort. CTAB, no wheezes, rhonchi, crackles.  Abd: Diffusely tender especially in lower quadrants. Soft, non-distended. No rebound tenderness or guarding.  GU: Deferred. MSK: No peripheral edema or signs of trauma. Extremities without deformity or TTP. No cyanosis or clubbing. Skin: warm, dry. No rashes or lesions. Back: No CVA tenderness Neuro: A&Ox4, CNs II-XII grossly intact. MAEs. Sensation grossly intact.  Psych: Normal mood and affect.   ED Results / Procedures / Treatments   Labs (all labs ordered are listed, but only abnormal results are displayed) Labs Reviewed  CBC - Abnormal; Notable for the following components:      Result Value   WBC 11.0 (*)    Hemoglobin 10.7 (*)    HCT 34.5 (*)    MCV 74.5 (*)    MCH 23.1 (*)    All other components within normal limits  LACTIC ACID, PLASMA - Abnormal; Notable for the following  components:   Lactic Acid, Venous 2.7 (*)    All other components within normal limits  URINALYSIS, W/ REFLEX TO CULTURE (INFECTION SUSPECTED) - Abnormal; Notable for the following components:   APPearance HAZY (*)    Hgb urine dipstick SMALL (*)    Protein, ur 100 (*)    Leukocytes,Ua MODERATE (*)    Bacteria, UA MANY (*)    All other components within normal limits  COMPREHENSIVE METABOLIC PANEL - Abnormal; Notable for the following components:   Chloride 113 (*)    CO2 20 (*)    Glucose, Bld 234 (*)    BUN 43 (*)    Creatinine, Ser 2.66 (*)    GFR, Estimated 18 (*)    All other components within normal limits  LIPASE, BLOOD - Abnormal; Notable for the  following components:   Lipase 62 (*)    All other components within normal limits  I-STAT CHEM 8, ED - Abnormal; Notable for the following components:   Chloride 113 (*)    BUN 47 (*)    Creatinine, Ser 3.00 (*)    Glucose, Bld 228 (*)    Hemoglobin 11.6 (*)    HCT 34.0 (*)    All other components within normal limits  URINE CULTURE  LACTIC ACID, PLASMA    EKG EKG Interpretation  Date/Time:  Friday Nov 14 2022 09:48:00 EDT Ventricular Rate:  74 PR Interval:  172 QRS Duration: 89 QT Interval:  407 QTC Calculation: 452 R Axis:   72 Text Interpretation: Sinus rhythm Baseline wander in lead(s) I III aVL Confirmed by Vivi Barrack [phone]) on 11/14/2022 10:41:44 AM  Radiology CT ABDOMEN PELVIS WO CONTRAST  Result Date: 11/14/2022 CLINICAL DATA:  Acute generalized abdominal pain. EXAM: CT ABDOMEN AND PELVIS WITHOUT CONTRAST TECHNIQUE: Multidetector CT imaging of the abdomen and pelvis was performed following the standard protocol without IV contrast. RADIATION DOSE REDUCTION: This exam was performed according to the departmental dose-optimization program which includes automated exposure control, adjustment of the mA and/or kV according to patient size and/or use of iterative reconstruction technique. COMPARISON:  May 16, 2022.  April 25, 2020. FINDINGS: Lower chest: Minimal bibasilar subsegmental atelectasis. 8 mm nodule is noted in right middle lobe which is unchanged compared to prior exam of 2023 and slightly enlarged compared to prior exam of 2021. Hepatobiliary: Minimal cholelithiasis. No biliary dilatation is noted. Liver is unremarkable. Pancreas: Stable 9 mm cyst seen in pancreatic tail. No pancreatic ductal dilatation or surrounding inflammatory changes. Spleen: Normal in size without focal abnormality. Adrenals/Urinary Tract: Adrenal glands appear normal. Bilateral nonobstructive nephrolithiasis is noted. Stable right renal cyst is noted for which no further follow-up is required. No hydronephrosis or renal obstruction is noted. Urinary bladder is unremarkable. Stomach/Bowel: Status post gastric bypass. There is no evidence of bowel obstruction or inflammation. Sigmoid diverticulosis is noted without inflammation. The appendix is unremarkable. Vascular/Lymphatic: Aortic atherosclerosis. No enlarged abdominal or pelvic lymph nodes. Reproductive: Uterus and bilateral adnexa are unremarkable. Other: No abdominal wall hernia or abnormality. No abdominopelvic ascites. 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 No acute osseous abnormality is noted. IMPRESSION: 8 mm right middle lobe nodule is noted which is slightly enlarged compared to prior exam of 2021. Follow-up CT scan in 12 months is recommended to ensure stability. Stable 9 mm cyst seen in pancreatic tail. Minimal cholelithiasis. Bilateral nonobstructive nephrolithiasis. Status post gastric bypass. Sigmoid diverticulosis without inflammation. 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 Aortic Atherosclerosis  (ICD10-I70.0). Electronically Signed   By: Lupita Raider M.D.   On: 11/14/2022 12:21    Procedures Procedures    Medications Ordered in ED Medications  iohexol (OMNIPAQUE) 300 MG/ML solution 100 mL (has no administration in time range)  metoprolol tartrate (LOPRESSOR) tablet 50 mg (has no administration in time range)  amLODipine (NORVASC) tablet 2.5 mg (has no administration in time range)  metoCLOPramide (REGLAN) injection 10 mg (has no administration in time range)  amLODipine (NORVASC) tablet 2.5 mg (2.5 mg Oral Given 11/14/22 1217)  metoprolol tartrate (LOPRESSOR) tablet 50 mg (50 mg Oral Given 11/14/22 1217)  cefTRIAXone (ROCEPHIN) 1 g in  sodium chloride 0.9 % 100 mL IVPB (0 g Intravenous Stopped 11/14/22 1424)  ondansetron (ZOFRAN) injection 4 mg (4 mg Intravenous Not Given 11/14/22 1238)    ED Course/ Medical Decision Making/ A&P                          Medical Decision Making Amount and/or Complexity of Data Reviewed Labs: ordered. Decision-making details documented in ED Course. Radiology: ordered. Decision-making details documented in ED Course.  Risk Prescription drug management.    This patient presents to the ED for concern of abdominal pain, N/V, inability to take PO; this involves an extensive number of treatment options, and is a complaint that carries with it a high risk of complications and morbidity.  I considered the following differential and admission for this acute, potentially life threatening condition.   MDM:    For DDX for abdominal pain includes but is not limited to:  Abdominal exam without peritoneal signs. No evidence of acute abdomen at this time.  Consider causes of abdominal pain such as diverticulitis, cystitis/pyelonephritis though no CVA tenderness but pain radiating into lower back, AAA, vascular catastrophe, or SBO given N/V. Low suspicion for acute hepatobiliary disease (including acute cholecystitis or cholangitis), acute pancreatitis  (neg lipase), With inability to take p.o. also consider hypoglycemia, electrolyte derangements, dehydration, renal injury. Patient hypertensive d/t no taking meds at home, vomiting, will control emesis and give home meds. No CP, HA, visual changes, FNDs to indicate hypertensive emergency. Will assess renal function.   Clinical Course as of 11/14/22 1530  Fri Nov 14, 2022  1041 WBC(!): 11.0 +Leukocytosis [HN]  1208 Lactic Acid, Venous: 1.7 [HN]  1209 Hemoglobin(!): 10.7 C/w prior [HN]  1209 Creatinine(!): 3.00 Ct without contrast [HN]  1209 Urinalysis, w/ Reflex to Culture (Infection Suspected) -Urine, Clean Catch(!) +UTI [HN]  1210 Per chart review, cefepime caused "encephalitis" listed in allergies. Patient tolerated rocephin in December 2023, will order today for UTI. [HN]  1245 CT ABDOMEN PELVIS WO CONTRAST 8 mm right middle lobe nodule is noted which is slightly enlarged compared to prior exam of 2021. Follow-up CT scan in 12 months is recommended to ensure stability.  Stable 9 mm cyst seen in pancreatic tail.  Minimal cholelithiasis.  Bilateral nonobstructive nephrolithiasis.  Status post gastric bypass.  Sigmoid diverticulosis without inflammation.  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 [HN]  1245 Creatinine(!): 2.66 C/w priors d/t CKD [HN]  1420 Patient asymptomatic from blood pressure standpoint. No headache, chest pain, visual changes, and no increase in Cr from baseline. Patient likely w/ asymptomatic hypertension as a result of N/V, unable to take BP meds d/ tN/V. Earlier tried giving metoprolol/amlodipine home meds but patient vomited and pills were noted in emesis. She is not nauseated/vomiting anymore, will give home meds. Pt received IV ceftriaxone for UTI. Urine culture pending. Patient and her daughter informed of imaging findings and recommended f/u imaging for  lung nodule. Will give cefpodoxime rx x 7 days, recommend f/u w/ PCP in 1 week. Patient and her daughter report undestanding. Will also give zofran ODT rx, recommend tylenol for pain control at home. DC w/ discharge instructions/return precautions. All questions answered to patient's satisfaction.   [HN]  1425 Patient with another episode of emesis. Will give reglan. Will need to tolerate PO prior to DC as she will need to be able to tolerate her PO  meds. [HN]  1529 Patient tolerating PO after reglan. Will give BP meds and DC.  [HN]    Clinical Course User Index [HN] Loetta Rough, MD    Labs: I Ordered, and personally interpreted labs.  The pertinent results include:  those listed above  Imaging Studies ordered: I ordered imaging studies including CT abd pelvis wo contrast I independently visualized and interpreted imaging. I agree with the radiologist interpretation  Additional history obtained from daughter at bedside, chart review.    Cardiac Monitoring: The patient was maintained on a cardiac monitor.  I personally viewed and interpreted the cardiac monitored which showed an underlying rhythm of: NSR  Reevaluation: After the interventions noted above, I reevaluated the patient and found that they have :stayed the same  Social Determinants of Health: Patient lives with daughter  Disposition: DC w/ abx and o/p f/u.    Co morbidities that complicate the patient evaluation  Past Medical History:  Diagnosis Date   Abnormal CT of the chest 12/28/2018   Acute encephalopathy 01/13/2020   Altered mental status    Anemia of chronic renal failure, stage 4 (severe) 01/04/2022   Aortic valve regurgitation 02/11/2016   Atherosclerosis of native coronary artery of native heart without angina pectoris 12/24/2012   Mild, non-obstructive CAD by cath in 2007   Atrophic vaginitis 12/15/2018   Atypical squamous cells of undetermined significance on cytologic smear of cervix (ASC-US)  12/15/2018   Benign essential hypertension 12/24/2012   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   Borderline steroid-induced glaucoma, 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   OD, Early elevated intraocular pressure to 28 mm we will discontinue all topical medications from surgery in the right eye today   Bronchiectasis without complication 02/04/2021   Cataract 07/03/2020   Chest pain at rest 01/30/2015   Chronic kidney disease, stage 4 (severe) 12/07/2020   Closed fracture of right tibial plateau 07/17/2021   Cyst of left kidney 03/10/2019   Needs repeated US 08/2019   Dysphagia, neurologic    Elevated d-dimer 12/20/2019   Facet arthritis of lumbar region 02/27/2019   Gastroesophageal reflux disease 08/26/2016   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  Further history patient suffered possible Wernicke's encephalopathy the past, a type of the vitamin deficiencies, thiamine that can also affect the retina if there is significant damage which may not be recoverable.   Mild neurocognitive disorder due to multiple etiologies 01/23/2022   Osteoporosis 12/15/2018   Overweight with body mass index (BMI) 25.0-29.9 12/15/2018   PAD (peripheral artery disease) 06/06/2019   Pain in right hand 04/27/2020   Pancreatic cyst 06/06/2019   Polyneuropathy due to type 2 diabetes mellitus    Posterior vitreous detachment of left eye 08/05/2021   Proliferative diabetic retinopathy of right eye 08/22/2021   Likely contributory cause of nonclearing vitreous hemorrhage in the  right eye now status post PRP likely to resolve in 2 quiescent PDR, post vitrectomy PRP   Pulmonary hypertension 02/04/2021   Right knee pain 07/10/2021   Sickle-cell trait 12/07/2020   Type II diabetes mellitus  03/10/2012   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   Vitreous hemorrhage of right eye 08/05/2021   Vitrectomy PRP 08-14-2021     Medicines Meds ordered this encounter  Medications   iohexol (OMNIPAQUE) 300 MG/ML solution 100 mL   amLODipine (NORVASC) tablet 2.5 mg   metoprolol tartrate (LOPRESSOR) tablet 50 mg   cefTRIAXone (ROCEPHIN) 1 g in sodium chloride 0.9 % 100 mL IVPB    Order Specific Question:   Antibiotic Indication:    Answer:   UTI   ondansetron (ZOFRAN) injection 4 mg   ondansetron (ZOFRAN) 4 MG/2ML injection    Synetta Fail A: cabinet override   metoprolol tartrate (LOPRESSOR) tablet 50 mg   amLODipine (NORVASC) tablet 2.5 mg   ondansetron (ZOFRAN-ODT) 4 MG disintegrating tablet    Sig: Take 1 tablet (4 mg total) by mouth every 8 (eight) hours as needed for nausea or vomiting.    Dispense:  20 tablet    Refill:  0   cefpodoxime (VANTIN) 100 MG tablet    Sig: Take 1 tablet (100 mg total) by mouth 2 (two) times daily for 7 days.    Dispense:  14 tablet    Refill:  0   metoCLOPramide (REGLAN) injection 10 mg    I have reviewed the patients home medicines and have made adjustments as needed  Problem List / ED Course: Problem List Items Addressed This Visit   None Visit Diagnoses     Acute cystitis without hematuria    -  Primary   Asymptomatic hypertension       Relevant Medications   amLODipine (NORVASC) tablet 2.5 mg (Completed)   metoprolol tartrate (LOPRESSOR) tablet 50 mg (Completed)   metoprolol tartrate (LOPRESSOR) tablet 50 mg (Start on 11/14/2022  2:30 PM)   amLODipine (NORVASC) tablet 2.5 mg (Start on 11/14/2022  2:30 PM)   Nausea and vomiting, unspecified vomiting type                        This note was created using dictation software, which may contain spelling or grammatical errors.    Loetta Rough, MD 11/14/22 [phone]

## 2022-11-15 ENCOUNTER — Other Ambulatory Visit: Payer: Self-pay

## 2022-11-15 ENCOUNTER — Encounter (HOSPITAL_COMMUNITY): Payer: Self-pay

## 2022-11-15 ENCOUNTER — Emergency Department (HOSPITAL_COMMUNITY): Payer: Medicare Other

## 2022-11-15 ENCOUNTER — Inpatient Hospital Stay (HOSPITAL_COMMUNITY)
Admission: EM | Admit: 2022-11-15 | Discharge: 2022-12-06 | DRG: 871 | Disposition: E | Payer: Medicare Other | Attending: Pulmonary Disease | Admitting: Pulmonary Disease

## 2022-11-15 DIAGNOSIS — Z1611 Resistance to penicillins: Secondary | ICD-10-CM | POA: Diagnosis present

## 2022-11-15 DIAGNOSIS — A4159 Other Gram-negative sepsis: Principal | ICD-10-CM | POA: Diagnosis present

## 2022-11-15 DIAGNOSIS — D5744 Sickle-cell thalassemia beta plus without crisis: Secondary | ICD-10-CM | POA: Diagnosis present

## 2022-11-15 DIAGNOSIS — I469 Cardiac arrest, cause unspecified: Secondary | ICD-10-CM | POA: Diagnosis not present

## 2022-11-15 DIAGNOSIS — E1151 Type 2 diabetes mellitus with diabetic peripheral angiopathy without gangrene: Secondary | ICD-10-CM | POA: Diagnosis present

## 2022-11-15 DIAGNOSIS — D65 Disseminated intravascular coagulation [defibrination syndrome]: Secondary | ICD-10-CM | POA: Diagnosis present

## 2022-11-15 DIAGNOSIS — Z743 Need for continuous supervision: Secondary | ICD-10-CM | POA: Diagnosis not present

## 2022-11-15 DIAGNOSIS — I129 Hypertensive chronic kidney disease with stage 1 through stage 4 chronic kidney disease, or unspecified chronic kidney disease: Secondary | ICD-10-CM | POA: Diagnosis not present

## 2022-11-15 DIAGNOSIS — E875 Hyperkalemia: Secondary | ICD-10-CM | POA: Diagnosis not present

## 2022-11-15 DIAGNOSIS — E872 Acidosis, unspecified: Secondary | ICD-10-CM | POA: Diagnosis present

## 2022-11-15 DIAGNOSIS — G9341 Metabolic encephalopathy: Secondary | ICD-10-CM | POA: Diagnosis not present

## 2022-11-15 DIAGNOSIS — R410 Disorientation, unspecified: Secondary | ICD-10-CM | POA: Diagnosis not present

## 2022-11-15 DIAGNOSIS — Z832 Family history of diseases of the blood and blood-forming organs and certain disorders involving the immune mechanism: Secondary | ICD-10-CM

## 2022-11-15 DIAGNOSIS — N189 Chronic kidney disease, unspecified: Secondary | ICD-10-CM | POA: Diagnosis not present

## 2022-11-15 DIAGNOSIS — E113591 Type 2 diabetes mellitus with proliferative diabetic retinopathy without macular edema, right eye: Secondary | ICD-10-CM | POA: Diagnosis present

## 2022-11-15 DIAGNOSIS — R6521 Severe sepsis with septic shock: Secondary | ICD-10-CM | POA: Diagnosis present

## 2022-11-15 DIAGNOSIS — Z823 Family history of stroke: Secondary | ICD-10-CM

## 2022-11-15 DIAGNOSIS — N179 Acute kidney failure, unspecified: Secondary | ICD-10-CM | POA: Diagnosis present

## 2022-11-15 DIAGNOSIS — R911 Solitary pulmonary nodule: Secondary | ICD-10-CM | POA: Diagnosis present

## 2022-11-15 DIAGNOSIS — K5730 Diverticulosis of large intestine without perforation or abscess without bleeding: Secondary | ICD-10-CM | POA: Diagnosis present

## 2022-11-15 DIAGNOSIS — Z87891 Personal history of nicotine dependence: Secondary | ICD-10-CM

## 2022-11-15 DIAGNOSIS — Z794 Long term (current) use of insulin: Secondary | ICD-10-CM

## 2022-11-15 DIAGNOSIS — Z5309 Procedure and treatment not carried out because of other contraindication: Secondary | ICD-10-CM | POA: Diagnosis present

## 2022-11-15 DIAGNOSIS — D631 Anemia in chronic kidney disease: Secondary | ICD-10-CM | POA: Diagnosis present

## 2022-11-15 DIAGNOSIS — J9601 Acute respiratory failure with hypoxia: Secondary | ICD-10-CM | POA: Diagnosis not present

## 2022-11-15 DIAGNOSIS — R7401 Elevation of levels of liver transaminase levels: Secondary | ICD-10-CM | POA: Diagnosis not present

## 2022-11-15 DIAGNOSIS — E785 Hyperlipidemia, unspecified: Secondary | ICD-10-CM | POA: Diagnosis present

## 2022-11-15 DIAGNOSIS — Z79899 Other long term (current) drug therapy: Secondary | ICD-10-CM

## 2022-11-15 DIAGNOSIS — M810 Age-related osteoporosis without current pathological fracture: Secondary | ICD-10-CM | POA: Diagnosis present

## 2022-11-15 DIAGNOSIS — Z515 Encounter for palliative care: Secondary | ICD-10-CM

## 2022-11-15 DIAGNOSIS — R579 Shock, unspecified: Principal | ICD-10-CM | POA: Diagnosis present

## 2022-11-15 DIAGNOSIS — Z833 Family history of diabetes mellitus: Secondary | ICD-10-CM

## 2022-11-15 DIAGNOSIS — K7200 Acute and subacute hepatic failure without coma: Secondary | ICD-10-CM | POA: Diagnosis present

## 2022-11-15 DIAGNOSIS — N3000 Acute cystitis without hematuria: Secondary | ICD-10-CM | POA: Diagnosis present

## 2022-11-15 DIAGNOSIS — N184 Chronic kidney disease, stage 4 (severe): Secondary | ICD-10-CM | POA: Diagnosis present

## 2022-11-15 DIAGNOSIS — Z66 Do not resuscitate: Secondary | ICD-10-CM | POA: Diagnosis not present

## 2022-11-15 DIAGNOSIS — D500 Iron deficiency anemia secondary to blood loss (chronic): Secondary | ICD-10-CM

## 2022-11-15 DIAGNOSIS — I272 Pulmonary hypertension, unspecified: Secondary | ICD-10-CM | POA: Diagnosis present

## 2022-11-15 DIAGNOSIS — R0689 Other abnormalities of breathing: Secondary | ICD-10-CM | POA: Diagnosis not present

## 2022-11-15 DIAGNOSIS — R6889 Other general symptoms and signs: Secondary | ICD-10-CM | POA: Diagnosis not present

## 2022-11-15 DIAGNOSIS — R739 Hyperglycemia, unspecified: Secondary | ICD-10-CM | POA: Diagnosis not present

## 2022-11-15 DIAGNOSIS — E1122 Type 2 diabetes mellitus with diabetic chronic kidney disease: Secondary | ICD-10-CM | POA: Diagnosis present

## 2022-11-15 DIAGNOSIS — R34 Anuria and oliguria: Secondary | ICD-10-CM | POA: Diagnosis not present

## 2022-11-15 DIAGNOSIS — I2510 Atherosclerotic heart disease of native coronary artery without angina pectoris: Secondary | ICD-10-CM | POA: Diagnosis present

## 2022-11-15 DIAGNOSIS — Z881 Allergy status to other antibiotic agents status: Secondary | ICD-10-CM

## 2022-11-15 DIAGNOSIS — N200 Calculus of kidney: Secondary | ICD-10-CM | POA: Diagnosis present

## 2022-11-15 DIAGNOSIS — Z9884 Bariatric surgery status: Secondary | ICD-10-CM

## 2022-11-15 DIAGNOSIS — E739 Lactose intolerance, unspecified: Secondary | ICD-10-CM | POA: Diagnosis present

## 2022-11-15 DIAGNOSIS — Z8249 Family history of ischemic heart disease and other diseases of the circulatory system: Secondary | ICD-10-CM

## 2022-11-15 NOTE — ED Triage Notes (Signed)
BIBA from home for AMS was dx with UTI yesterday discharged with meds, family stated she is becoming more lethargic and altered

## 2022-11-16 ENCOUNTER — Emergency Department (HOSPITAL_COMMUNITY): Payer: Medicare Other

## 2022-11-16 ENCOUNTER — Inpatient Hospital Stay (HOSPITAL_COMMUNITY): Payer: Medicare Other

## 2022-11-16 DIAGNOSIS — I272 Pulmonary hypertension, unspecified: Secondary | ICD-10-CM | POA: Diagnosis present

## 2022-11-16 DIAGNOSIS — Z794 Long term (current) use of insulin: Secondary | ICD-10-CM | POA: Diagnosis not present

## 2022-11-16 DIAGNOSIS — E875 Hyperkalemia: Secondary | ICD-10-CM

## 2022-11-16 DIAGNOSIS — N281 Cyst of kidney, acquired: Secondary | ICD-10-CM | POA: Diagnosis not present

## 2022-11-16 DIAGNOSIS — Z1611 Resistance to penicillins: Secondary | ICD-10-CM | POA: Diagnosis present

## 2022-11-16 DIAGNOSIS — D631 Anemia in chronic kidney disease: Secondary | ICD-10-CM | POA: Diagnosis present

## 2022-11-16 DIAGNOSIS — R7881 Bacteremia: Secondary | ICD-10-CM | POA: Diagnosis not present

## 2022-11-16 DIAGNOSIS — Z515 Encounter for palliative care: Secondary | ICD-10-CM | POA: Diagnosis not present

## 2022-11-16 DIAGNOSIS — N184 Chronic kidney disease, stage 4 (severe): Secondary | ICD-10-CM | POA: Diagnosis present

## 2022-11-16 DIAGNOSIS — Z4659 Encounter for fitting and adjustment of other gastrointestinal appliance and device: Secondary | ICD-10-CM | POA: Diagnosis not present

## 2022-11-16 DIAGNOSIS — N179 Acute kidney failure, unspecified: Secondary | ICD-10-CM

## 2022-11-16 DIAGNOSIS — E113591 Type 2 diabetes mellitus with proliferative diabetic retinopathy without macular edema, right eye: Secondary | ICD-10-CM | POA: Diagnosis present

## 2022-11-16 DIAGNOSIS — I469 Cardiac arrest, cause unspecified: Secondary | ICD-10-CM | POA: Diagnosis not present

## 2022-11-16 DIAGNOSIS — D65 Disseminated intravascular coagulation [defibrination syndrome]: Secondary | ICD-10-CM | POA: Diagnosis present

## 2022-11-16 DIAGNOSIS — D5744 Sickle-cell thalassemia beta plus without crisis: Secondary | ICD-10-CM | POA: Diagnosis present

## 2022-11-16 DIAGNOSIS — E785 Hyperlipidemia, unspecified: Secondary | ICD-10-CM | POA: Diagnosis present

## 2022-11-16 DIAGNOSIS — A4159 Other Gram-negative sepsis: Secondary | ICD-10-CM | POA: Diagnosis present

## 2022-11-16 DIAGNOSIS — I129 Hypertensive chronic kidney disease with stage 1 through stage 4 chronic kidney disease, or unspecified chronic kidney disease: Secondary | ICD-10-CM | POA: Diagnosis present

## 2022-11-16 DIAGNOSIS — K7200 Acute and subacute hepatic failure without coma: Secondary | ICD-10-CM | POA: Diagnosis present

## 2022-11-16 DIAGNOSIS — D649 Anemia, unspecified: Secondary | ICD-10-CM | POA: Diagnosis not present

## 2022-11-16 DIAGNOSIS — E1151 Type 2 diabetes mellitus with diabetic peripheral angiopathy without gangrene: Secondary | ICD-10-CM | POA: Diagnosis present

## 2022-11-16 DIAGNOSIS — G9341 Metabolic encephalopathy: Secondary | ICD-10-CM | POA: Diagnosis not present

## 2022-11-16 DIAGNOSIS — J9601 Acute respiratory failure with hypoxia: Secondary | ICD-10-CM | POA: Diagnosis not present

## 2022-11-16 DIAGNOSIS — R579 Shock, unspecified: Principal | ICD-10-CM | POA: Diagnosis present

## 2022-11-16 DIAGNOSIS — A419 Sepsis, unspecified organism: Secondary | ICD-10-CM

## 2022-11-16 DIAGNOSIS — E1122 Type 2 diabetes mellitus with diabetic chronic kidney disease: Secondary | ICD-10-CM | POA: Diagnosis present

## 2022-11-16 DIAGNOSIS — G9340 Encephalopathy, unspecified: Secondary | ICD-10-CM | POA: Diagnosis not present

## 2022-11-16 DIAGNOSIS — N3000 Acute cystitis without hematuria: Secondary | ICD-10-CM | POA: Diagnosis present

## 2022-11-16 DIAGNOSIS — J9811 Atelectasis: Secondary | ICD-10-CM | POA: Diagnosis not present

## 2022-11-16 DIAGNOSIS — D500 Iron deficiency anemia secondary to blood loss (chronic): Secondary | ICD-10-CM | POA: Diagnosis present

## 2022-11-16 DIAGNOSIS — Z66 Do not resuscitate: Secondary | ICD-10-CM | POA: Diagnosis not present

## 2022-11-16 DIAGNOSIS — R112 Nausea with vomiting, unspecified: Secondary | ICD-10-CM | POA: Diagnosis present

## 2022-11-16 DIAGNOSIS — R6521 Severe sepsis with septic shock: Secondary | ICD-10-CM | POA: Diagnosis present

## 2022-11-16 DIAGNOSIS — E872 Acidosis, unspecified: Secondary | ICD-10-CM | POA: Diagnosis present

## 2022-11-16 DIAGNOSIS — R578 Other shock: Secondary | ICD-10-CM | POA: Diagnosis not present

## 2022-11-16 LAB — LACTIC ACID, PLASMA
Lactic Acid, Venous: >9 mmol/L (ref 0.5–1.9)
Lactic Acid, Venous: >9 mmol/L (ref 0.5–1.9)
Lactic Acid, Venous: >9 mmol/L (ref 0.5–1.9)
Lactic Acid, Venous: >9 mmol/L (ref 0.5–1.9)
Lactic Acid, Venous: >9 mmol/L (ref 0.5–1.9)

## 2022-11-16 LAB — CULTURE, BLOOD (ROUTINE X 2)

## 2022-11-16 LAB — URINALYSIS, W/ REFLEX TO CULTURE (INFECTION SUSPECTED)
Bacteria, UA: NONE SEEN
Bilirubin Urine: NEGATIVE
Glucose, UA: 150 mg/dL — AB
Ketones, ur: NEGATIVE mg/dL
Nitrite: NEGATIVE
Protein, ur: >=300 mg/dL — AB
Specific Gravity, Urine: 1.011 (ref 1.005–1.030)
pH: 5 (ref 5.0–8.0)

## 2022-11-16 LAB — COMPREHENSIVE METABOLIC PANEL
ALT: 3613 U/L — ABNORMAL HIGH (ref 0–44)
ALT: 7528 U/L — ABNORMAL HIGH (ref 0–44)
AST: 3313 U/L — ABNORMAL HIGH (ref 15–41)
AST: 6648 U/L — ABNORMAL HIGH (ref 15–41)
Albumin: 2.7 g/dL — ABNORMAL LOW (ref 3.5–5.0)
Albumin: 2.5 g/dL — ABNORMAL LOW (ref 3.5–5.0)
Alkaline Phosphatase: 172 U/L — ABNORMAL HIGH (ref 38–126)
Alkaline Phosphatase: 183 U/L — ABNORMAL HIGH (ref 38–126)
BUN: 54 mg/dL — ABNORMAL HIGH (ref 8–23)
BUN: 49 mg/dL — ABNORMAL HIGH (ref 8–23)
CO2: <7 mmol/L — ABNORMAL LOW (ref 22–32)
CO2: <7 mmol/L — ABNORMAL LOW (ref 22–32)
Calcium: 8.1 mg/dL — ABNORMAL LOW (ref 8.9–10.3)
Calcium: 8 mg/dL — ABNORMAL LOW (ref 8.9–10.3)
Chloride: 107 mmol/L (ref 98–111)
Chloride: 107 mmol/L (ref 98–111)
Creatinine, Ser: 4.14 mg/dL — ABNORMAL HIGH (ref 0.44–1.00)
Creatinine, Ser: 4.22 mg/dL — ABNORMAL HIGH (ref 0.44–1.00)
GFR, Estimated: 11 mL/min — ABNORMAL LOW (ref >60)
GFR, Estimated: 10 mL/min — ABNORMAL LOW (ref >60)
Glucose, Bld: 277 mg/dL — ABNORMAL HIGH (ref 70–99)
Glucose, Bld: 243 mg/dL — ABNORMAL HIGH (ref 70–99)
Potassium: >7.5 mmol/L (ref 3.5–5.1)
Potassium: >7.5 mmol/L (ref 3.5–5.1)
Sodium: 140 mmol/L (ref 135–145)
Sodium: 141 mmol/L (ref 135–145)
Total Bilirubin: 1.8 mg/dL — ABNORMAL HIGH (ref 0.3–1.2)
Total Bilirubin: 2.2 mg/dL — ABNORMAL HIGH (ref 0.3–1.2)
Total Protein: 5.9 g/dL — ABNORMAL LOW (ref 6.5–8.1)
Total Protein: 5.5 g/dL — ABNORMAL LOW (ref 6.5–8.1)

## 2022-11-16 LAB — CBC WITH DIFFERENTIAL/PLATELET
Abs Immature Granulocytes: 0.69 10*3/uL — ABNORMAL HIGH (ref 0.00–0.07)
Abs Immature Granulocytes: 0.3 10*3/uL — ABNORMAL HIGH (ref 0.00–0.07)
Abs Immature Granulocytes: 0.23 10*3/uL — ABNORMAL HIGH (ref 0.00–0.07)
Basophils Absolute: 0 10*3/uL (ref 0.0–0.1)
Basophils Absolute: 0.1 10*3/uL (ref 0.0–0.1)
Basophils Absolute: 0.1 10*3/uL (ref 0.0–0.1)
Basophils Relative: 0 %
Basophils Relative: 0 %
Basophils Relative: 0 %
Eosinophils Absolute: 0 10*3/uL (ref 0.0–0.5)
Eosinophils Absolute: 0 10*3/uL (ref 0.0–0.5)
Eosinophils Absolute: 0 10*3/uL (ref 0.0–0.5)
Eosinophils Relative: 0 %
Eosinophils Relative: 0 %
Eosinophils Relative: 0 %
HCT: 22.3 % — ABNORMAL LOW (ref 36.0–46.0)
HCT: 28.9 % — ABNORMAL LOW (ref 36.0–46.0)
HCT: 27.3 % — ABNORMAL LOW (ref 36.0–46.0)
Hemoglobin: 6.6 g/dL — CL (ref 12.0–15.0)
Hemoglobin: 9.1 g/dL — ABNORMAL LOW (ref 12.0–15.0)
Hemoglobin: 8.7 g/dL — ABNORMAL LOW (ref 12.0–15.0)
Immature Granulocytes: 4 %
Immature Granulocytes: 2 %
Immature Granulocytes: 2 %
Lymphocytes Relative: 9 %
Lymphocytes Relative: 11 %
Lymphocytes Relative: 13 %
Lymphs Abs: 1.8 10*3/uL (ref 0.7–4.0)
Lymphs Abs: 1.8 10*3/uL (ref 0.7–4.0)
Lymphs Abs: 1.9 10*3/uL (ref 0.7–4.0)
MCH: 23.9 pg — ABNORMAL LOW (ref 26.0–34.0)
MCH: 25 pg — ABNORMAL LOW (ref 26.0–34.0)
MCH: 24.6 pg — ABNORMAL LOW (ref 26.0–34.0)
MCHC: 29.6 g/dL — ABNORMAL LOW (ref 30.0–36.0)
MCHC: 31.5 g/dL (ref 30.0–36.0)
MCHC: 31.9 g/dL (ref 30.0–36.0)
MCV: 80.8 fL (ref 80.0–100.0)
MCV: 79.4 fL — ABNORMAL LOW (ref 80.0–100.0)
MCV: 77.1 fL — ABNORMAL LOW (ref 80.0–100.0)
Monocytes Absolute: 2.2 10*3/uL — ABNORMAL HIGH (ref 0.1–1.0)
Monocytes Absolute: 1.1 10*3/uL — ABNORMAL HIGH (ref 0.1–1.0)
Monocytes Absolute: 0.9 10*3/uL (ref 0.1–1.0)
Monocytes Relative: 11 %
Monocytes Relative: 7 %
Monocytes Relative: 6 %
Neutro Abs: 14.2 10*3/uL — ABNORMAL HIGH (ref 1.7–7.7)
Neutro Abs: 12.9 10*3/uL — ABNORMAL HIGH (ref 1.7–7.7)
Neutro Abs: 11.6 10*3/uL — ABNORMAL HIGH (ref 1.7–7.7)
Neutrophils Relative %: 76 %
Neutrophils Relative %: 80 %
Neutrophils Relative %: 79 %
Platelets: 35 10*3/uL — ABNORMAL LOW (ref 150–400)
Platelets: 44 10*3/uL — ABNORMAL LOW (ref 150–400)
Platelets: 39 10*3/uL — ABNORMAL LOW (ref 150–400)
RBC: 2.76 MIL/uL — ABNORMAL LOW (ref 3.87–5.11)
RBC: 3.64 MIL/uL — ABNORMAL LOW (ref 3.87–5.11)
RBC: 3.54 MIL/uL — ABNORMAL LOW (ref 3.87–5.11)
RDW: 15.4 % (ref 11.5–15.5)
RDW: 16 % — ABNORMAL HIGH (ref 11.5–15.5)
RDW: 15.9 % — ABNORMAL HIGH (ref 11.5–15.5)
WBC: 18.9 10*3/uL — ABNORMAL HIGH (ref 4.0–10.5)
WBC: 16.1 10*3/uL — ABNORMAL HIGH (ref 4.0–10.5)
WBC: 14.7 10*3/uL — ABNORMAL HIGH (ref 4.0–10.5)
nRBC: 2.4 % — ABNORMAL HIGH (ref 0.0–0.2)
nRBC: 4.4 % — ABNORMAL HIGH (ref 0.0–0.2)
nRBC: 5.8 % — ABNORMAL HIGH (ref 0.0–0.2)

## 2022-11-16 LAB — TYPE AND SCREEN
Unit division: 0
Unit division: 0

## 2022-11-16 LAB — CBG MONITORING, ED: Glucose-Capillary: 232 mg/dL — ABNORMAL HIGH (ref 70–99)

## 2022-11-16 LAB — RENAL FUNCTION PANEL
Albumin: 2.5 g/dL — ABNORMAL LOW (ref 3.5–5.0)
Albumin: 2.3 g/dL — ABNORMAL LOW (ref 3.5–5.0)
Anion gap: 18 — ABNORMAL HIGH (ref 5–15)
Anion gap: 24 — ABNORMAL HIGH (ref 5–15)
BUN: 55 mg/dL — ABNORMAL HIGH (ref 8–23)
BUN: 53 mg/dL — ABNORMAL HIGH (ref 8–23)
CO2: 8 mmol/L — ABNORMAL LOW (ref 22–32)
CO2: 11 mmol/L — ABNORMAL LOW (ref 22–32)
Calcium: 7.4 mg/dL — ABNORMAL LOW (ref 8.9–10.3)
Calcium: 6.8 mg/dL — ABNORMAL LOW (ref 8.9–10.3)
Chloride: 110 mmol/L (ref 98–111)
Chloride: 107 mmol/L (ref 98–111)
Creatinine, Ser: 4.19 mg/dL — ABNORMAL HIGH (ref 0.44–1.00)
Creatinine, Ser: 4.06 mg/dL — ABNORMAL HIGH (ref 0.44–1.00)
GFR, Estimated: 11 mL/min — ABNORMAL LOW (ref >60)
GFR, Estimated: 11 mL/min — ABNORMAL LOW (ref >60)
Glucose, Bld: 268 mg/dL — ABNORMAL HIGH (ref 70–99)
Glucose, Bld: 265 mg/dL — ABNORMAL HIGH (ref 70–99)
Phosphorus: 12.5 mg/dL — ABNORMAL HIGH (ref 2.5–4.6)
Phosphorus: 10.2 mg/dL — ABNORMAL HIGH (ref 2.5–4.6)
Potassium: 6.2 mmol/L — ABNORMAL HIGH (ref 3.5–5.1)
Potassium: 4.5 mmol/L (ref 3.5–5.1)
Sodium: 136 mmol/L (ref 135–145)
Sodium: 142 mmol/L (ref 135–145)

## 2022-11-16 LAB — GLUCOSE, CAPILLARY
Glucose-Capillary: 211 mg/dL — ABNORMAL HIGH (ref 70–99)
Glucose-Capillary: 255 mg/dL — ABNORMAL HIGH (ref 70–99)
Glucose-Capillary: 223 mg/dL — ABNORMAL HIGH (ref 70–99)
Glucose-Capillary: 239 mg/dL — ABNORMAL HIGH (ref 70–99)

## 2022-11-16 LAB — IRON AND TIBC
Iron: 176 ug/dL — ABNORMAL HIGH (ref 28–170)
Saturation Ratios: 130 % — ABNORMAL HIGH (ref 10.4–31.8)
TIBC: 136 ug/dL — ABNORMAL LOW (ref 250–450)

## 2022-11-16 LAB — DIC (DISSEMINATED INTRAVASCULAR COAGULATION)PANEL
D-Dimer, Quant: >20 ug/mL-FEU — ABNORMAL HIGH (ref 0.00–0.50)
Fibrinogen: 265 mg/dL (ref 210–475)
INR: 3.1 — ABNORMAL HIGH (ref 0.8–1.2)
Platelets: 42 10*3/uL — ABNORMAL LOW (ref 150–400)
Prothrombin Time: 32.3 seconds — ABNORMAL HIGH (ref 11.4–15.2)
Smear Review: NONE SEEN
aPTT: 48 seconds — ABNORMAL HIGH (ref 24–36)

## 2022-11-16 LAB — BASIC METABOLIC PANEL
Anion gap: 24 — ABNORMAL HIGH (ref 5–15)
BUN: 53 mg/dL — ABNORMAL HIGH (ref 8–23)
CO2: 11 mmol/L — ABNORMAL LOW (ref 22–32)
Calcium: 6.5 mg/dL — ABNORMAL LOW (ref 8.9–10.3)
Chloride: 107 mmol/L (ref 98–111)
Creatinine, Ser: 4.31 mg/dL — ABNORMAL HIGH (ref 0.44–1.00)
GFR, Estimated: 10 mL/min — ABNORMAL LOW (ref >60)
Glucose, Bld: 247 mg/dL — ABNORMAL HIGH (ref 70–99)
Potassium: 4.4 mmol/L (ref 3.5–5.1)
Sodium: 142 mmol/L (ref 135–145)

## 2022-11-16 LAB — BPAM RBC
Blood Product Expiration Date: 202406032359
ISSUE DATE / TIME: 202405120356
Unit Type and Rh: 6200

## 2022-11-16 LAB — BLOOD GAS, ARTERIAL
Acid-base deficit: 16.4 mmol/L — ABNORMAL HIGH (ref 0.0–2.0)
Acid-base deficit: 14 mmol/L — ABNORMAL HIGH (ref 0.0–2.0)
Bicarbonate: 9.4 mmol/L — ABNORMAL LOW (ref 20.0–28.0)
Bicarbonate: 11.3 mmol/L — ABNORMAL LOW (ref 20.0–28.0)
Drawn by: 59133
Drawn by: 59133
O2 Saturation: 96.6 %
O2 Saturation: 96.1 %
Patient temperature: 35.6
Patient temperature: 34.4
pCO2 arterial: 21 mmHg — ABNORMAL LOW (ref 32–48)
pCO2 arterial: 21 mmHg — ABNORMAL LOW (ref 32–48)
pH, Arterial: 7.26 — ABNORMAL LOW (ref 7.35–7.45)
pH, Arterial: 7.32 — ABNORMAL LOW (ref 7.35–7.45)
pO2, Arterial: 71 mmHg — ABNORMAL LOW (ref 83–108)
pO2, Arterial: 59 mmHg — ABNORMAL LOW (ref 83–108)

## 2022-11-16 LAB — CBC
HCT: 25.6 % — ABNORMAL LOW (ref 36.0–46.0)
Hemoglobin: 8.2 g/dL — ABNORMAL LOW (ref 12.0–15.0)
MCH: 24.7 pg — ABNORMAL LOW (ref 26.0–34.0)
MCHC: 32 g/dL (ref 30.0–36.0)
MCV: 77.1 fL — ABNORMAL LOW (ref 80.0–100.0)
Platelets: 35 10*3/uL — ABNORMAL LOW (ref 150–400)
RBC: 3.32 MIL/uL — ABNORMAL LOW (ref 3.87–5.11)
RDW: 16.1 % — ABNORMAL HIGH (ref 11.5–15.5)
WBC: 13.7 10*3/uL — ABNORMAL HIGH (ref 4.0–10.5)
nRBC: 6 % — ABNORMAL HIGH (ref 0.0–0.2)

## 2022-11-16 LAB — AMMONIA: Ammonia: 126 umol/L — ABNORMAL HIGH (ref 9–35)

## 2022-11-16 LAB — FERRITIN: Ferritin: >7500 ng/mL — ABNORMAL HIGH (ref 11–307)

## 2022-11-16 LAB — VITAMIN B12: Vitamin B-12: 5235 pg/mL — ABNORMAL HIGH (ref 180–914)

## 2022-11-16 LAB — RETICULOCYTES
Immature Retic Fract: 22.5 % — ABNORMAL HIGH (ref 2.3–15.9)
RBC.: 2.43 MIL/uL — ABNORMAL LOW (ref 3.87–5.11)
Retic Count, Absolute: 69.5 10*3/uL (ref 19.0–186.0)
Retic Ct Pct: 2.9 % (ref 0.4–3.1)

## 2022-11-16 LAB — URINE CULTURE: Culture: >=100000 — AB

## 2022-11-16 LAB — FOLATE: Folate: >40 ng/mL (ref >5.9)

## 2022-11-16 LAB — POC OCCULT BLOOD, ED: Fecal Occult Bld: POSITIVE — AB

## 2022-11-16 LAB — URIC ACID: Uric Acid, Serum: 11.7 mg/dL — ABNORMAL HIGH (ref 2.5–7.1)

## 2022-11-16 LAB — PROCALCITONIN: Procalcitonin: 12.71 ng/mL

## 2022-11-16 LAB — MRSA NEXT GEN BY PCR, NASAL: MRSA by PCR Next Gen: NOT DETECTED

## 2022-11-16 LAB — PREPARE RBC (CROSSMATCH)

## 2022-11-16 MED ORDER — INSULIN ASPART 100 UNIT/ML IV SOLN
5.0000 [IU] | Freq: Once | INTRAVENOUS | Status: AC
  Administered 2022-11-16: 5 [IU] via INTRAVENOUS

## 2022-11-16 MED ORDER — PRISMASOL BGK 0/2.5 32-2.5 MEQ/L EC SOLN
Status: DC
  Filled 2022-11-16 (×24): qty 5000

## 2022-11-16 MED ORDER — CALCIUM GLUCONATE-NACL 1-0.675 GM/50ML-% IV SOLN
1.0000 g | Freq: Once | INTRAVENOUS | Status: AC
  Administered 2022-11-16: 1000 mg via INTRAVENOUS
  Filled 2022-11-16: qty 50

## 2022-11-16 MED ORDER — LACTULOSE 10 GM/15ML PO SOLN
20.0000 g | Freq: Four times a day (QID) | ORAL | Status: DC
  Administered 2022-11-16: 20 g via ORAL
  Filled 2022-11-16: qty 30

## 2022-11-16 MED ORDER — LACTATED RINGERS IV BOLUS: 1000.0000 mL | Freq: Once | INTRAVENOUS | Status: DC

## 2022-11-16 MED ORDER — ORAL CARE MOUTH RINSE: 15.0000 mL | OROMUCOSAL | Status: DC | PRN

## 2022-11-16 MED ORDER — ORAL CARE MOUTH RINSE: 15.0000 mL | OROMUCOSAL | Status: DC

## 2022-11-16 MED ORDER — SODIUM BICARBONATE 8.4 % IV SOLN
50.0000 meq | Freq: Once | INTRAVENOUS | Status: AC
  Administered 2022-11-16: 50 meq via INTRAVENOUS
  Filled 2022-11-16: qty 50

## 2022-11-16 MED ORDER — SODIUM CHLORIDE 0.9 % IV SOLN
1000.0000 mg | Freq: Two times a day (BID) | INTRAVENOUS | Status: DC
  Administered 2022-11-16: 1000 mg via INTRAVENOUS
  Filled 2022-11-16: qty 20

## 2022-11-16 MED ORDER — LACTULOSE 10 GM/15ML PO SOLN
30.0000 g | ORAL | Status: DC
  Administered 2022-11-17 (×2): 30 g
  Filled 2022-11-16 (×2): qty 45

## 2022-11-16 MED ORDER — SODIUM CHLORIDE 0.9 % IV SOLN
2.0000 g | Freq: Once | INTRAVENOUS | Status: AC
  Administered 2022-11-16: 2 g via INTRAVENOUS
  Filled 2022-11-16: qty 20

## 2022-11-16 MED ORDER — LACTULOSE ENEMA
300.0000 mL | Freq: Four times a day (QID) | ORAL | Status: DC
  Administered 2022-11-16: 300 mL via RECTAL
  Filled 2022-11-16 (×2): qty 300

## 2022-11-16 MED ORDER — NOREPINEPHRINE 4 MG/250ML-% IV SOLN
2.0000 ug/min | INTRAVENOUS | Status: DC
  Administered 2022-11-16: 2 ug/min via INTRAVENOUS
  Filled 2022-11-16 (×2): qty 250

## 2022-11-16 MED ORDER — PRISMASOL BGK 0/2.5 32-2.5 MEQ/L EC SOLN
Status: DC
  Filled 2022-11-16 (×4): qty 5000

## 2022-11-16 MED ORDER — SODIUM CHLORIDE 0.9 % FOR CRRT: INTRAVENOUS_CENTRAL | Status: DC | PRN

## 2022-11-16 MED ORDER — NOREPINEPHRINE 4 MG/250ML-% IV SOLN
0.0000 ug/min | INTRAVENOUS | Status: DC
  Administered 2022-11-16: 3 ug/min via INTRAVENOUS
  Administered 2022-11-17: 6 ug/min via INTRAVENOUS
  Administered 2022-11-17: 14 ug/min via INTRAVENOUS
  Filled 2022-11-16 (×2): qty 250

## 2022-11-16 MED ORDER — SODIUM CHLORIDE 0.9 % IV SOLN: 1.0000 g | Freq: Once | INTRAVENOUS | Status: DC

## 2022-11-16 MED ORDER — SODIUM CHLORIDE 0.9 % IV SOLN
250.0000 mL | INTRAVENOUS | Status: DC
  Administered 2022-11-16: 250 mL via INTRAVENOUS

## 2022-11-16 MED ORDER — SODIUM CHLORIDE 0.9 % IV SOLN
1.0000 g | Freq: Three times a day (TID) | INTRAVENOUS | Status: DC
  Administered 2022-11-16 – 2022-11-17 (×3): 1 g via INTRAVENOUS
  Filled 2022-11-16 (×3): qty 20

## 2022-11-16 MED ORDER — SODIUM CHLORIDE 0.9% IV SOLUTION: Freq: Once | INTRAVENOUS | Status: AC

## 2022-11-16 MED ORDER — VANCOMYCIN HCL 1500 MG/300ML IV SOLN
1500.0000 mg | INTRAVENOUS | Status: AC
  Administered 2022-11-16: 1500 mg via INTRAVENOUS
  Filled 2022-11-16: qty 300

## 2022-11-16 MED ORDER — SODIUM BICARBONATE 8.4 % IV SOLN
INTRAVENOUS | Status: DC
  Filled 2022-11-16 (×2): qty 1000
  Filled 2022-11-16 (×2): qty 150
  Filled 2022-11-16: qty 1000
  Filled 2022-11-16: qty 150

## 2022-11-16 MED ORDER — HEPARIN SODIUM (PORCINE) 1000 UNIT/ML DIALYSIS
1000.0000 [IU] | INTRAMUSCULAR | Status: DC | PRN
  Administered 2022-11-16: 1000 [IU] via INTRAVENOUS_CENTRAL
  Administered 2022-11-16 (×6): 1200 [IU] via INTRAVENOUS_CENTRAL
  Filled 2022-11-16 (×4): qty 6
  Filled 2022-11-16: qty 3
  Filled 2022-11-16 (×4): qty 6

## 2022-11-16 MED ORDER — VANCOMYCIN HCL IN DEXTROSE 1-5 GM/200ML-% IV SOLN: 1000.0000 mg | INTRAVENOUS | Status: DC

## 2022-11-16 MED ORDER — POLYETHYLENE GLYCOL 3350 17 G PO PACK: 17.0000 g | PACK | Freq: Every day | ORAL | Status: DC | PRN

## 2022-11-16 MED ORDER — INSULIN ASPART 100 UNIT/ML IV SOLN
5.0000 [IU] | Freq: Once | INTRAVENOUS | Status: AC
  Administered 2022-11-16: 5 [IU] via INTRAVENOUS
  Filled 2022-11-16: qty 0.05

## 2022-11-16 MED ORDER — LACTATED RINGERS IV BOLUS (SEPSIS)
1000.0000 mL | Freq: Once | INTRAVENOUS | Status: AC
  Administered 2022-11-16: 1000 mL via INTRAVENOUS

## 2022-11-16 MED ORDER — VITAMIN K1 10 MG/ML IJ SOLN
10.0000 mg | Freq: Once | INTRAVENOUS | Status: AC
  Administered 2022-11-16: 10 mg via INTRAVENOUS
  Filled 2022-11-16: qty 1

## 2022-11-16 MED ORDER — SODIUM ZIRCONIUM CYCLOSILICATE 10 G PO PACK
10.0000 g | PACK | Freq: Once | ORAL | Status: DC
  Filled 2022-11-16: qty 1

## 2022-11-16 MED ORDER — INSULIN ASPART 100 UNIT/ML IJ SOLN
0.0000 [IU] | INTRAMUSCULAR | Status: DC
  Administered 2022-11-16 (×3): 5 [IU] via SUBCUTANEOUS
  Administered 2022-11-16: 8 [IU] via SUBCUTANEOUS
  Administered 2022-11-17: 5 [IU] via SUBCUTANEOUS
  Administered 2022-11-17: 3 [IU] via SUBCUTANEOUS

## 2022-11-16 MED ORDER — PANTOPRAZOLE SODIUM 40 MG IV SOLR
40.0000 mg | Freq: Two times a day (BID) | INTRAVENOUS | Status: DC
  Administered 2022-11-16 – 2022-11-17 (×2): 40 mg via INTRAVENOUS
  Filled 2022-11-16 (×3): qty 10

## 2022-11-16 MED ORDER — SODIUM CHLORIDE 0.9 % IV SOLN
1.0000 g | Freq: Once | INTRAVENOUS | Status: DC
  Filled 2022-11-16: qty 20

## 2022-11-16 MED ORDER — LACTATED RINGERS IV SOLN: INTRAVENOUS | Status: DC

## 2022-11-16 MED ORDER — CHLORHEXIDINE GLUCONATE CLOTH 2 % EX PADS
6.0000 | MEDICATED_PAD | Freq: Every day | CUTANEOUS | Status: DC
  Administered 2022-11-16 – 2022-11-17 (×2): 6 via TOPICAL

## 2022-11-16 MED ORDER — LACTATED RINGERS IV BOLUS (SEPSIS)
1500.0000 mL | Freq: Once | INTRAVENOUS | Status: AC
  Administered 2022-11-16: 1500 mL via INTRAVENOUS

## 2022-11-16 MED ORDER — ALBUTEROL SULFATE (2.5 MG/3ML) 0.083% IN NEBU
10.0000 mg | INHALATION_SOLUTION | Freq: Once | RESPIRATORY_TRACT | Status: AC
  Administered 2022-11-16: 10 mg via RESPIRATORY_TRACT
  Filled 2022-11-16: qty 12

## 2022-11-16 MED ORDER — DOCUSATE SODIUM 100 MG PO CAPS: 100.0000 mg | ORAL_CAPSULE | Freq: Two times a day (BID) | ORAL | Status: DC | PRN

## 2022-11-16 NOTE — Procedures (Signed)
Arterial Catheter Insertion Procedure Note  Miranda Roman  [id number]  [birth year]  Date:11/16/22  Time:10:06 AM    Provider Performing: Comer Locket. Vassie Loll    Procedure: Insertion of Arterial Line (52841) with US guidance (32440)   Indication(s) Blood pressure monitoring and/or need for frequent ABGs  Consent Risks of the procedure as well as the alternatives and risks of each were explained to the patient and/or caregiver.  Consent for the procedure was obtained and is signed in the bedside chart  Anesthesia None   Time Out Verified patient identification, verified procedure, site/side was marked, verified correct patient position, special equipment/implants available, medications/allergies/relevant history reviewed, required imaging and test results available.   Sterile Technique Maximal sterile technique including full sterile barrier drape, hand hygiene, sterile gown, sterile gloves, mask, hair covering, sterile ultrasound probe cover (if used).   Procedure Description Area of catheter insertion was cleaned with chlorhexidine and draped in sterile fashion. With real-time ultrasound guidance an arterial catheter was placed into the right femoral artery.  Appropriate arterial tracings confirmed on monitor.     Complications/Tolerance None; patient tolerated the procedure well.   EBL Minimal   Specimen(s) None   Miranda Roman V. Vassie Loll MD

## 2022-11-16 NOTE — ED Notes (Signed)
ED TO INPATIENT HANDOFF REPORT  ED Nurse Name and Phone #: Dessa Phi  S Name/Age/Gender Miranda Roman 77 y.o. female Room/Bed: WA20/WA20  Code Status   Code Status: Prior  Home/SNF/Other Home Patient oriented to: self Is this baseline? No   Triage Complete: Triage complete  Chief Complaint Septic shock (HCC) [A41.9, R65.21]  Triage Note BIBA from home for AMS was dx with UTI yesterday discharged with meds, family stated she is becoming more lethargic and altered   Allergies Allergies  Allergen Reactions   Cefepime Other (See Comments)    Causes encephalitis - reported by [facility] 12/31/2019   Lactose Intolerance (Gi)     Per pt's daughter causes gas    Level of Care/Admitting Diagnosis ED Disposition     ED Disposition  Admit   Condition  --   Comment  [facility]: [facility] [[id number]]  Level of Care: ICU [6]  May admit patient to [facility] or [facility] if equivalent level of care is available:: Yes  Covid Evaluation: Asymptomatic - no recent exposure (last 10 days) testing not required  Diagnosis: Septic shock [facility]) [[id number]]  Admitting Physician: Oretha Milch [[id number]]  Attending Physician: Oretha Milch [[id number]]  Certification:: I certify this patient will need inpatient services for at least 2 midnights  Estimated Length of Stay: 7          B Medical/Surgery History Past Medical History:  Diagnosis Date   Abnormal CT of the chest 12/28/2018   Acute encephalopathy 01/13/2020   Altered mental status    Anemia of chronic renal failure, stage 4 (severe) 01/04/2022   Aortic valve regurgitation 02/11/2016   Atherosclerosis of native coronary artery of native heart without angina pectoris 12/24/2012   Mild, non-obstructive CAD by cath in 2007   Atrophic vaginitis 12/15/2018   Atypical squamous cells of undetermined significance on cytologic smear of cervix (ASC-US) 12/15/2018   Benign essential hypertension  12/24/2012   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   Borderline steroid-induced glaucoma, 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   OD, Early elevated intraocular pressure to 28 mm we will discontinue all topical medications from surgery in the right eye today   Bronchiectasis without complication 02/04/2021   Cataract 07/03/2020   Chest pain at rest 01/30/2015   Chronic kidney disease, stage 4 (severe) 12/07/2020   Closed fracture of right tibial plateau 07/17/2021   Cyst of left kidney 03/10/2019   Needs repeated US 08/2019   Dysphagia, neurologic    Elevated d-dimer 12/20/2019   Facet arthritis of lumbar region 02/27/2019   Gastroesophageal reflux disease 08/26/2016   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  Further history patient suffered possible Wernicke's encephalopathy the past, a type of the vitamin deficiencies, thiamine that can also affect the retina if there is significant damage which may not be recoverable.   Mild neurocognitive disorder due to multiple etiologies 01/23/2022   Osteoporosis 12/15/2018   Overweight with body mass index (BMI) 25.0-29.9 12/15/2018   PAD (peripheral artery disease) 06/06/2019   Pain in right  hand 04/27/2020   Pancreatic cyst 06/06/2019   Polyneuropathy due to type 2 diabetes mellitus    Posterior vitreous detachment of left eye 08/05/2021   Proliferative diabetic retinopathy of right eye 08/22/2021   Likely contributory cause of nonclearing vitreous hemorrhage in the right eye now status post PRP likely to  resolve in 2 quiescent PDR, post vitrectomy PRP   Pulmonary hypertension 02/04/2021   Right knee pain 07/10/2021   Sickle-cell trait 12/07/2020   Type II diabetes mellitus 03/10/2012   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   Vitreous hemorrhage of right eye 08/05/2021   Vitrectomy PRP 08-14-2021   Past Surgical History:  Procedure Laterality Date   BONE RESECTION  01/2006   wrist   GASTRIC BYPASS  08/14/2009   OPEN REDUCTION INTERNAL FIXATION (ORIF) TIBIA/FIBULA FRACTURE Right 05/10/2022   Procedure: OPEN REDUCTION INTERNAL FIXATION (ORIF) TIBIA/FIBULA FRACTURE;  Surgeon: Teryl Lucy, MD;  Location: [facility];  Service: Orthopedics;  Laterality: Right;   OTHER SURGICAL HISTORY     sickle cell retinopathy laser surgery   TUBAL LIGATION  04/28/1977     A IV Location/Drains/Wounds Patient Lines/Drains/Airways Status     Active Line/Drains/Airways     Name Placement date Placement time Site Days   Peripheral IV 11/16/22 18 G 2.5" Anterior;Left;Proximal;Upper Arm 11/16/22  0046  Arm  less than 1   Peripheral IV 11/16/22 18 G 2.5" Anterior;Proximal;Right;Upper Arm 11/16/22  0058  Arm  less than 1   Urethral Catheter Dan Humphreys Latex 14 Fr. 11/26/2022  2340  Latex  1            Intake/Output Last 24 hours  Intake/Output Summary (Last 24 hours) at 11/16/2022 0981 Last data filed at 11/16/2022 1914 Gross per 24 hour  Intake 2965 ml  Output --  Net 2965 ml    Labs/Imaging Results for orders placed or performed during the hospital encounter of 11/06/2022 (from the past 48 hour(s))  Lactic acid, plasma     Status: Abnormal   Collection Time: 11/21/2022 11:45 PM  Result Value Ref Range   Lactic Acid, Venous >9.0 (HH) 0.5 - 1.9 mmol/L    Comment: CRITICAL RESULT CALLED TO, READ BACK BY AND VERIFIED WITH [place], C RN @ [phone] 11/16/22. GILBERTL Performed at [facility], [address] [place], [place] [address]   Urinalysis, w/  Reflex to Culture (Infection Suspected) -Urine, Catheterized     Status: Abnormal   Collection Time: 11/16/22 12:28 AM  Result Value Ref Range   Specimen Source URINE, CATHETERIZED    Color, Urine YELLOW YELLOW   APPearance CLEAR CLEAR   Specific Gravity, Urine 1.011 1.005 - 1.030   pH 5.0 5.0 - 8.0   Glucose, UA 150 (A) NEGATIVE mg/dL   Hgb urine dipstick MODERATE (A) NEGATIVE   Bilirubin Urine NEGATIVE NEGATIVE   Ketones, ur NEGATIVE NEGATIVE mg/dL   Protein, ur >=086 (A) NEGATIVE mg/dL   Nitrite NEGATIVE NEGATIVE   Leukocytes,Ua MODERATE (A) NEGATIVE   RBC / HPF 21-50 0 - 5 RBC/hpf   WBC, UA 21-50 0 - 5 WBC/hpf    Comment:        Reflex urine culture not performed if WBC <=10, OR if Squamous epithelial cells >5. If Squamous epithelial cells >5 suggest recollection.    Bacteria, UA NONE SEEN NONE SEEN   Squamous Epithelial / HPF 0-5 0 - 5 /HPF   Granular Casts, UA PRESENT     Comment: Performed  at [facility], [address], [place], [place] [address]  Lactic acid, plasma     Status: Abnormal   Collection Time: 11/16/22  1:25 AM  Result Value Ref Range   Lactic Acid, Venous >9.0 (HH) 0.5 - 1.9 mmol/L    Comment: CRITICAL VALUE NOTED. VALUE IS CONSISTENT WITH PREVIOUSLY REPORTED/CALLED VALUE Performed at [facility], [address] [place], [place] [address]   Comprehensive metabolic panel     Status: Abnormal   Collection Time: 11/16/22  1:25 AM  Result Value Ref Range   Sodium 140 135 - 145 mmol/L    Comment: ELECTROLYTES REPEATED TO VERIFY   Potassium >7.5 (HH) 3.5 - 5.1 mmol/L    Comment: ELECTROLYTES REPEATED TO VERIFY CRITICAL RESULT CALLED TO, READ BACK BY AND VERIFIED WITH GREY, C RN @ [phone] 11/16/22. GILBERTL    Chloride 107 98 - 111 mmol/L    Comment: ELECTROLYTES REPEATED TO VERIFY   CO2 <7 (L) 22 - 32 mmol/L    Comment: ELECTROLYTES REPEATED TO VERIFY   Glucose, Bld 277 (H) 70 - 99 mg/dL    Comment: Glucose  reference range applies only to samples taken after fasting for at least 8 hours.   BUN 54 (H) 8 - 23 mg/dL   Creatinine, Ser 1.91 (H) 0.44 - 1.00 mg/dL   Calcium 8.1 (L) 8.9 - 10.3 mg/dL    Comment: ELECTROLYTES REPEATED TO VERIFY   Total Protein 5.9 (L) 6.5 - 8.1 g/dL   Albumin 2.7 (L) 3.5 - 5.0 g/dL   AST 4,782 (H) 15 - 41 U/L    Comment: RESULT CONFIRMED BY MANUAL DILUTION   ALT 3,613 (H) 0 - 44 U/L    Comment: RESULT CONFIRMED BY MANUAL DILUTION   Alkaline Phosphatase 172 (H) 38 - 126 U/L   Total Bilirubin 1.8 (H) 0.3 - 1.2 mg/dL   GFR, Estimated 11 (L) >60 mL/min    Comment: (NOTE) Calculated using the CKD-EPI Creatinine Equation (2021)    Anion gap NOT CALCULATED 5 - 15    Comment: ELECTROLYTES REPEATED TO VERIFY Performed at [facility], [address] [place], [place] [address]   CBC with Differential/Platelet     Status: Abnormal   Collection Time: 11/16/22  1:29 AM  Result Value Ref Range   WBC 18.9 (H) 4.0 - 10.5 K/uL   RBC 2.76 (L) 3.87 - 5.11 MIL/uL   Hemoglobin 6.6 (LL) 12.0 - 15.0 g/dL    Comment: REPEATED TO VERIFY THIS CRITICAL RESULT HAS VERIFIED AND BEEN CALLED TO S CARTER, RN BY MEGAN HAYES ON 05 12 2024 AT 0141, AND HAS BEEN READ BACK. CRITICAL RESULT VERIFIED    HCT 22.3 (L) 36.0 - 46.0 %   MCV 80.8 80.0 - 100.0 fL    Comment: DELTA CHECK NOTED  SPOKE TO S CARTER, RN ABOUT RESULTS    MCH 23.9 (L) 26.0 - 34.0 pg   MCHC 29.6 (L) 30.0 - 36.0 g/dL   RDW 30.8 65.7 - 84.6 %   Platelets 35 (L) 150 - 400 K/uL    Comment: Immature Platelet Fraction may be clinically indicated, consider ordering this additional test [id number] REPEATED TO VERIFY DELTA CHECK NOTED SPOKE TO S CARTER, RN ABOUT RESULTS 0141 MH    nRBC 2.4 (H) 0.0 - 0.2 %   Neutrophils Relative % 76 %   Neutro Abs 14.2 (H) 1.7 - 7.7 K/uL   Lymphocytes Relative 9 %   Lymphs Abs 1.8 0.7 -  4.0 K/uL   Monocytes Relative 11 %   Monocytes Absolute 2.2 (H) 0.1 - 1.0 K/uL    Eosinophils Relative 0 %   Eosinophils Absolute 0.0 0.0 - 0.5 K/uL   Basophils Relative 0 %   Basophils Absolute 0.0 0.0 - 0.1 K/uL   Immature Granulocytes 4 %   Abs Immature Granulocytes 0.69 (H) 0.00 - 0.07 K/uL    Comment: Performed at [facility], [address] [place], [place] [address]  Vitamin B12     Status: Abnormal   Collection Time: 11/16/22  2:11 AM  Result Value Ref Range   Vitamin B-12 5,235 (H) 180 - 914 pg/mL    Comment: RESULT CONFIRMED BY MANUAL DILUTION (NOTE) This assay is not validated for testing neonatal or myeloproliferative syndrome specimens for Vitamin B12 levels. Performed at [facility], [address], [place], [place] [address]   Folate     Status: None   Collection Time: 11/16/22  2:11 AM  Result Value Ref Range   Folate >40.0 >5.9 ng/mL    Comment: RESULT CONFIRMED BY MANUAL DILUTION Performed at [facility], [address], [place], [place] [address]   Iron and TIBC     Status: Abnormal   Collection Time: 11/16/22  2:11 AM  Result Value Ref Range   Iron 176 (H) 28 - 170 ug/dL   TIBC 914 (L) 782 - 956 ug/dL   Saturation Ratios 213 (H) 10.4 - 31.8 %   UIBC NOT CALCULATED ug/dL    Comment: Performed at [facility], [address], [place], [place] [address]  Ferritin     Status: Abnormal   Collection Time: 11/16/22  2:11 AM  Result Value Ref Range   Ferritin >7,500 (H) 11 - 307 ng/mL    Comment: Performed at [facility], [address], [place], [place] [address]  Reticulocytes     Status: Abnormal   Collection Time: 11/16/22  2:11 AM  Result Value Ref Range   Retic Ct Pct 2.9 0.4 - 3.1 %   RBC. 2.43 (L) 3.87 - 5.11 MIL/uL   Retic Count, Absolute 69.5 19.0 - 186.0 K/uL   Immature Retic Fract 22.5 (H) 2.3 - 15.9 %    Comment: Performed at [facility], [address], [place], [place] [address]  Type and screen [facility]     Status: None (Preliminary result)   Collection Time: 11/16/22  2:11 AM  Result Value Ref Range   ABO/RH(D) A POS    Antibody Screen NEG    Sample Expiration 11/19/2022,2359    Unit Number [id number]    Blood Component Type RED CELLS,LR    Unit division 00    Status of Unit ALLOCATED    Transfusion Status OK TO TRANSFUSE    Crossmatch Result Compatible    Unit Number [id number]    Blood Component Type RED CELLS,LR    Unit division 00    Status of Unit ISSUED    Transfusion Status OK TO TRANSFUSE    Crossmatch Result      Compatible Performed at [facility], [address] [place], [place] [address]   Prepare RBC (crossmatch)     Status: None   Collection Time: 11/16/22  2:11 AM  Result Value Ref Range   Order Confirmation      ORDER PROCESSED BY BLOOD BANK Performed at [facility], [address] [place],  [place] [address]   POC occult blood, ED RN will collect     Status: Abnormal   Collection Time: 11/16/22  2:48 AM  Result Value Ref Range   Fecal Occult Bld POSITIVE (A) NEGATIVE  Lactic acid, plasma     Status: Abnormal   Collection Time: 11/16/22  4:10 AM  Result Value Ref Range   Lactic Acid, Venous >9.0 (HH) 0.5 - 1.9 mmol/L    Comment: CRITICAL VALUE NOTED. VALUE IS CONSISTENT WITH PREVIOUSLY REPORTED/CALLED VALUE Performed at [facility], [address] [place] [address] [place], [place] [address]   CBG monitoring, ED     Status: Abnormal   Collection Time: 11/16/22  6:22 AM  Result Value Ref Range   Glucose-Capillary 232 (H) 70 - 99 mg/dL    Comment: Glucose reference range applies only to samples taken after fasting for at least 8 hours.  Comprehensive metabolic panel     Status: Abnormal   Collection Time: 11/16/22  6:31 AM  Result Value Ref Range   Sodium 141 135 - 145 mmol/L   Potassium >7.5 (HH) 3.5 - 5.1 mmol/L    Comment: NO VISIBLE HEMOLYSIS CRITICAL RESULT CALLED TO, READ BACK  BY AND VERIFIED WITH SAVOIE,B. RN AT 0981 11/16/22 MULLINS,T    Chloride 107 98 - 111 mmol/L   CO2 <7 (L) 22 - 32 mmol/L   Glucose, Bld 243 (H) 70 - 99 mg/dL    Comment: Glucose reference range applies only to samples taken after fasting for at least 8 hours.   BUN 49 (H) 8 - 23 mg/dL   Creatinine, Ser 1.91 (H) 0.44 - 1.00 mg/dL   Calcium 8.0 (L) 8.9 - 10.3 mg/dL   Total Protein 5.5 (L) 6.5 - 8.1 g/dL   Albumin 2.5 (L) 3.5 - 5.0 g/dL   AST 4,782 (H) 15 - 41 U/L    Comment: RESULT CONFIRMED BY MANUAL DILUTION   ALT 7,528 (H) 0 - 44 U/L    Comment: RESULT CONFIRMED BY MANUAL DILUTION   Alkaline Phosphatase 183 (H) 38 - 126 U/L   Total Bilirubin 2.2 (H) 0.3 - 1.2 mg/dL   GFR, Estimated 10 (L) >60 mL/min    Comment: (NOTE) Calculated using the CKD-EPI Creatinine Equation (2021)    Anion gap NOT CALCULATED 5 - 15    Comment: Performed at [facility], [address] [place], [place] [address]  Uric acid     Status: Abnormal   Collection Time: 11/16/22  6:31 AM  Result Value Ref Range   Uric Acid, Serum 11.7 (H) 2.5 - 7.1 mg/dL    Comment: HEMOLYSIS AT THIS LEVEL MAY AFFECT RESULT Performed at [facility], [address], [place], [place] [address]    DG Chest Port 1 View  Result Date: 11/16/2022 CLINICAL DATA:  Lethargic and altered mental status. EXAM: PORTABLE CHEST 1 VIEW COMPARISON:  June 01, 2022 FINDINGS: The heart size and mediastinal contours are within normal limits. Low lung volumes are noted. Both lungs are clear. The visualized skeletal structures are unremarkable. IMPRESSION: No active disease. Electronically Signed   By: Aram Candela M.D.   On: 11/16/2022 01:05   CT ABDOMEN PELVIS WO CONTRAST  Result Date: 11/14/2022 CLINICAL DATA:  Acute generalized abdominal pain. EXAM: CT ABDOMEN AND PELVIS WITHOUT CONTRAST TECHNIQUE: Multidetector CT imaging of the abdomen and pelvis was performed following the standard protocol  without IV contrast. RADIATION DOSE REDUCTION: This exam was performed according to the departmental dose-optimization program  which includes automated exposure control, adjustment of the mA and/or kV according to patient size and/or use of iterative reconstruction technique. COMPARISON:  May 16, 2022.  April 25, 2020. FINDINGS: Lower chest: Minimal bibasilar subsegmental atelectasis. 8 mm nodule is noted in right middle lobe which is unchanged compared to prior exam of 2023 and slightly enlarged compared to prior exam of 2021. Hepatobiliary: Minimal cholelithiasis. No biliary dilatation is noted. Liver is unremarkable. Pancreas: Stable 9 mm cyst seen in pancreatic tail. No pancreatic ductal dilatation or surrounding inflammatory changes. Spleen: Normal in size without focal abnormality. Adrenals/Urinary Tract: Adrenal glands appear normal. Bilateral nonobstructive nephrolithiasis is noted. Stable right renal cyst is noted for which no further follow-up is required. No hydronephrosis or renal obstruction is noted. Urinary bladder is unremarkable. Stomach/Bowel: Status post gastric bypass. There is no evidence of bowel obstruction or inflammation. Sigmoid diverticulosis is noted without inflammation. The appendix is unremarkable. Vascular/Lymphatic: Aortic atherosclerosis. No enlarged abdominal or pelvic lymph nodes. Reproductive: Uterus and bilateral adnexa are unremarkable. Other: No abdominal wall hernia or abnormality. No abdominopelvic ascites. 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 No acute osseous abnormality is noted. IMPRESSION: 8 mm right middle lobe nodule is noted which is slightly enlarged compared to prior exam of 2021. Follow-up CT scan in 12 months is recommended to ensure stability. Stable 9 mm cyst seen in pancreatic tail. Minimal  cholelithiasis. Bilateral nonobstructive nephrolithiasis. Status post gastric bypass. Sigmoid diverticulosis without inflammation. 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 Aortic Atherosclerosis (ICD10-I70.0). Electronically Signed   By: Lupita Raider M.D.   On: 11/14/2022 12:21    Pending Labs Unresulted Labs (From admission, onward)     Start     Ordered   11/16/22 0103  CBC with Differential/Platelet  Once,   R        11/16/22 0103   11/16/22 0028  Urine Culture  Once,   R        11/16/22 0028   11/16/22 0018  Protime-INR  Once,   STAT        11/16/22 0018   12/01/2022 2334  CBC with Differential  Once,   STAT        11/10/2022 2334   11/18/2022 2334  Culture, blood (Routine x 2)  BLOOD CULTURE X 2,   R      11/05/2022 2334            Vitals/Pain Today's Vitals   11/16/22 0600 11/16/22 0630 11/16/22 0700 11/16/22 0730  BP: (!) 95/49 (!) 92/58 (!) 87/53 (!) 83/54  Pulse: 80 79 (!) 51 82  Resp: (!) 28 (!) 34 (!) 24 (!) 27  Temp:   (!) 94.7 F (34.8 C)   TempSrc:   Rectal   SpO2: 93% 97% 97% 98%  Weight:      Height:      PainSc:        Isolation Precautions No active isolations  Medications Medications  sodium zirconium cyclosilicate (LOKELMA) packet 10 g (10 g Oral Not Given 11/16/22 0500)  lactated ringers bolus 1,000 mL (1,000 mLs Intravenous Not Given 11/16/22 0706)  sodium bicarbonate 150 mEq in dextrose 5 % 1,150 mL infusion ( Intravenous New Bag/Given 11/16/22 0459)  meropenem (MERREM) 1,000 mg in sodium chloride 0.9 % 100 mL IVPB (  has no administration in time range)  lactated ringers bolus 1,000 mL (0 mLs Intravenous Stopped 11/16/22 0212)  cefTRIAXone (ROCEPHIN) 2 g in sodium chloride 0.9 % 100 mL IVPB (0 g Intravenous Stopped 11/16/22 0138)  lactated ringers bolus 1,500 mL (0 mLs Intravenous Stopped 11/16/22 0246)  0.9 %  sodium chloride infusion (Manually  program via Guardrails IV Fluids) ( Intravenous New Bag/Given 11/16/22 0423)  calcium gluconate 1 g/ 50 mL sodium chloride IVPB (0 mg Intravenous Stopped 11/16/22 0500)  albuterol (PROVENTIL) (2.5 MG/3ML) 0.083% nebulizer solution 10 mg (10 mg Nebulization Given 11/16/22 0443)  insulin aspart (novoLOG) injection 5 Units (5 Units Intravenous Given 11/16/22 0444)  sodium bicarbonate injection 50 mEq (50 mEq Intravenous Given 11/16/22 0436)    Mobility non-ambulatory     Focused Assessments Cardiac Assessment Handoff:    No results found for: "CKTOTAL", "CKMB", "CKMBINDEX", "TROPONINI" No results found for: "DDIMER" Does the Patient currently have chest pain? No    R Recommendations: See Admitting Provider Note  Report given to:   Additional Notes:

## 2022-11-16 NOTE — Procedures (Signed)
Central Venous Catheter Insertion Procedure Note  Miranda Roman  [id number]  September 24, 1945  Date:11/16/22  Time:10:05 AM   Provider Performing:Strother Everitt V. Vassie Loll   Procedure: Insertion of Non-tunneled Central Venous Catheter(36556)with US guidance (95621)    Indication(s) Hemodialysis  Consent Risks of the procedure as well as the alternatives and risks of each were explained to the patient and/or caregiver.  Consent for the procedure was obtained and is signed in the bedside chart  Anesthesia Topical only with 1% lidocaine   Timeout Verified patient identification, verified procedure, site/side was marked, verified correct patient position, special equipment/implants available, medications/allergies/relevant history reviewed, required imaging and test results available.  Sterile Technique Maximal sterile technique including full sterile barrier drape, hand hygiene, sterile gown, sterile gloves, mask, hair covering, sterile ultrasound probe cover (if used).  Procedure Description Area of catheter insertion was cleaned with chlorhexidine and draped in sterile fashion.   With real-time ultrasound guidance a HD catheter was placed into the right femoral vein.  Nonpulsatile blood flow and easy flushing noted in all ports.  The catheter was sutured in place and sterile dressing applied.  Complications/Tolerance None; patient tolerated the procedure well. Chest X-ray is ordered to verify placement for internal jugular or subclavian cannulation.  Chest x-ray is not ordered for femoral cannulation.  EBL Minimal  Specimen(s) None   Miranda Roman V. Vassie Loll MD

## 2022-11-16 NOTE — Progress Notes (Addendum)
eLink Physician-Brief Progress Note Patient Name: Miranda Roman DOB: 25-Oct-1945 MRN: [id number]   Date of Service  11/16/2022  HPI/Events of Note  77 year old female that is critically ill in the setting of encephalopathy, renal failure, unable to tolerate continuous renal replacement therapy creatinine remains decreased, ABG was ordered-shows no evidence of hypercapnia.  BUN is in the 50s.  LFTs have been elevated.  eICU Interventions  Add ammonia  Currently protecting her airway, although the situation is tenuous.  Maintain head of bed elevation at 30 degrees  If there is a need for urgent dialysis, will need to transfer to [facility]   2004 - Ammonia 126, add ng tube, schedule lactulose NG and Rectal  0000 -rectal tube dysfunctioning.  Unable to deliver rectal lactulose.  Will increase dose of enteral lactulose instead.  Intervention Category Major Interventions: Delirium, psychosis, severe agitation - evaluation and management  Careem Yasui 11/16/2022, 9:33 PM

## 2022-11-16 NOTE — H&P (Signed)
NAME:  ORLINE BLACKLEY, MRN:  [id number], DOB:  1945/10/15, LOS: 0 ADMISSION DATE:  11/29/2022, CONSULTATION DATE:  11/16/2022  REFERRING MD:  Preston Fleeting, EDP , CHIEF COMPLAINT:  lactic acidosis   History of Present Illness:  77 year old diabetic presented to ED 5/10 with nausea vomiting, bilateral lower back pain and generalized abdominal pain Showed mild leukocytosis, lactate 2.7, lipase 62, UA showed many bacteria with moderate leukocytes, BUN/creatinine was 47/3.0.  She was given ceftriaxone for UTI.  CT abdomen/pelvis without contrast showed bilateral nonobstructive nephrolithiasis and sigmoid diverticulosis without inflammation.  8 mm right middle lobe nodule was noted.  She was discharged on oral cefpodoxime.  She was noted to be tolerating oral. At home her oral intake decreased over the last 24 hours and she became more lethargic and was brought back to the emergency room Labs showed lactic acid more than 9 , elevated LFTs, BUN/creatinine 54/4.1 with potassium more than 7.5 and increasing leukocytosis.  Urine culture showing Klebsiella .she was hypothermic, hypotensive  Pertinent  Medical History  Diabetes type 2, insulin requiring Hyperlipidemia CKD stage IV Pulm hypertension Beta thalassemia Gastric bypass   Significant Hospital Events: Including procedures, antibiotic start and stop dates in addition to other pertinent events     Interim History / Subjective:    Objective   Blood pressure (!) 95/57, pulse 82, temperature (!) 96.7 F (35.9 C), temperature source Rectal, resp. rate (!) 32, height 5\' 4"  (1.626 m), weight 76.2 kg, SpO2 98 %.        Intake/Output Summary (Last 24 hours) at 11/16/2022 0900 Last data filed at 11/16/2022 9629 Gross per 24 hour  Intake 2965 ml  Output --  Net 2965 ml   Filed Weights   11/12/2022 2332  Weight: 76.2 kg    Examination: General: Elderly woman, ED stretcher, mild distress HENT: Pallor present, no icterus, no JVD, dry  mucosa Lungs: Clear breath sounds bilateral Cardiovascular: S1-S2 regular, no murmur  Abdomen: soft, nontender, no hepatosplenomegaly Extremities: No edema, painful to touch Neuro: Follows one-step commands, lethargic, moves all 4 extremities, no meningeal signs GU: No urine  Labs show hemoglobin 6.6, WBC 19 K, elevated LFTs, repeat potassium more than 7.5, repeat lactate more than 9  Resolved Hospital Problem list     Assessment & Plan:  Septic shock likely due to Klebsiella UTI, high chance of resistant organism.  She has a previous history of Pseudomonas UTI  -Will change antibiotics to meropenem.  She has cefepime allergy but this seems to be related to encephalopathy which is a side effect and not a real allergy to beta-lactam -Start Levophed to maintain MAP 65 and above Likelihood of needing mechanical ventilation due to progressive multiorgan failure  AKI with hyperkalemia -Place dialysis catheter and start CRRT -She has received treatment with calcium D50/insulin and Lokelma  Elevated LFTs -likely shock liver, avoid hepatotoxins  Hypothermia -surface cooling with warming blanket  Anemia Severe thrombocytopenia-related to sepsis versus DIC. Check DIC profile Transfused 1 unit PRBC  Best Practice (right click and "Reselect all SmartList Selections" daily)   Diet/type: NPO DVT prophylaxis: SCD GI prophylaxis: PPI Lines: Central line and Dialysis Catheter Foley:  Yes, and it is still needed Code Status:  full code Last date of multidisciplinary goals of care discussion [discussed with daughter and son Caryn Bee, they request full CODE STATUS]  Labs   CBC: Recent Labs  Lab 11/14/22 0923 11/14/22 1124 11/16/22 0129  WBC 11.0*  --  18.9*  NEUTROABS  --   --  14.2*  HGB 10.7* 11.6* 6.6*  HCT 34.5* 34.0* 22.3*  MCV 74.5*  --  80.8  PLT 166  --  35*    Basic Metabolic Panel: Recent Labs  Lab 11/14/22 1112 11/14/22 1124 11/16/22 0125 11/16/22 0631  NA 143 144  140 141  K 4.0 4.5 >7.5* >7.5*  CL 113* 113* 107 107  CO2 20*  --  <7* <7*  GLUCOSE 234* 228* 277* 243*  BUN 43* 47* 54* 49*  CREATININE 2.66* 3.00* 4.14* 4.22*  CALCIUM 9.1  --  8.1* 8.0*   GFR: Estimated Creatinine Clearance: 11.5 mL/min (A) (by C-G formula based on SCr of 4.22 mg/dL (H)). Recent Labs  Lab 11/14/22 0923 11/14/22 1116 11/14/2022 2345 11/16/22 0125 11/16/22 0129 11/16/22 0410  WBC 11.0*  --   --   --  18.9*  --   LATICACIDVEN 2.7* 1.7 >9.0* >9.0*  --  >9.0*    Liver Function Tests: Recent Labs  Lab 11/14/22 1112 11/16/22 0125 11/16/22 0631  AST 23 3,313* 6,648*  ALT 39 3,613* 7,528*  ALKPHOS 69 172* 183*  BILITOT 0.6 1.8* 2.2*  PROT 7.1 5.9* 5.5*  ALBUMIN 3.8 2.7* 2.5*   Recent Labs  Lab 11/14/22 1112  LIPASE 62*   No results for input(s): "AMMONIA" in the last 168 hours.  ABG    Component Value Date/Time   TCO2 22 11/14/2022 1124     Coagulation Profile: No results for input(s): "INR", "PROTIME" in the last 168 hours.  Cardiac Enzymes: No results for input(s): "CKTOTAL", "CKMB", "CKMBINDEX", "TROPONINI" in the last 168 hours.  HbA1C: Hemoglobin A1C  Date/Time Value Ref Range Status  09/03/2020 12:00 AM 5.2  Final   Hgb A1c MFr Bld  Date/Time Value Ref Range Status  06/10/2022 04:58 PM 5.9 (H) 4.8 - 5.6 % Final    Comment:             Prediabetes: 5.7 - 6.4          Diabetes: >6.4          Glycemic control for adults with diabetes: <7.0   01/29/2022 11:39 AM 5.5 4.8 - 5.6 % Final    Comment:             Prediabetes: 5.7 - 6.4          Diabetes: >6.4          Glycemic control for adults with diabetes: <7.0     CBG: Recent Labs  Lab 11/16/22 0622  GLUCAP 232*    Review of Systems:   Unable to obtain since poor mental status  Past Medical History:  She,  has a past medical history of Abnormal CT of the chest (12/28/2018), Acute encephalopathy (01/13/2020), Altered mental status, Anemia of chronic renal failure, stage 4  (severe) (01/04/2022), Aortic valve regurgitation (02/11/2016), Atherosclerosis of native coronary artery of native heart without angina pectoris (12/24/2012), Atrophic vaginitis (12/15/2018), Atypical squamous cells of undetermined significance on cytologic smear of cervix (ASC-US) (12/15/2018), Benign essential hypertension (12/24/2012), 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), Borderline steroid-induced glaucoma, 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), Bronchiectasis without complication (02/04/2021), Cataract (07/03/2020), Chest pain at rest (01/30/2015), Chronic kidney disease, stage 4 (severe) (12/07/2020), Closed fracture of right tibial plateau (07/17/2021), Cyst of left kidney (03/10/2019), Dysphagia, neurologic, Elevated d-dimer (12/20/2019), Facet arthritis of lumbar region (02/27/2019), Gastroesophageal reflux disease (08/26/2016), History of gastric bypass (08/14/2009), Hyperglycemia due to type 2 diabetes mellitus (12/07/2020), Hyperlipidemia, Hypertensive nephropathy (10/15/2017), Inadequate oral nutritional intake, Leukocytosis (12/22/2019), Long term (current) use of insulin (  12/07/2020), Low grade squamous intraepithelial lesion (LGSIL) on cervicovaginal cytologic smear (12/15/2018), Lumbago (02/27/2019), Macular atrophy, 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), Mild neurocognitive disorder due to multiple etiologies (01/23/2022), Osteoporosis (12/15/2018), Overweight with body mass index (BMI) 25.0-29.9 (12/15/2018), PAD (peripheral artery disease) (06/06/2019), Pain in right hand (04/27/2020), Pancreatic cyst (06/06/2019), Polyneuropathy due to type 2 diabetes mellitus, Posterior vitreous detachment of left eye (08/05/2021), Proliferative diabetic retinopathy of right eye (08/22/2021), Pulmonary hypertension (02/04/2021), Right knee pain (07/10/2021), 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   Surgical History:   Past Surgical  History:  Procedure Laterality Date   BONE RESECTION  01/2006   wrist   GASTRIC BYPASS  08/14/2009   OPEN REDUCTION INTERNAL FIXATION (ORIF) TIBIA/FIBULA FRACTURE Right 05/10/2022   Procedure: OPEN REDUCTION INTERNAL FIXATION (ORIF) TIBIA/FIBULA FRACTURE;  Surgeon: Teryl Lucy, MD;  Location: [facility];  Service: Orthopedics;  Laterality: Right;   OTHER SURGICAL HISTORY     sickle cell retinopathy laser surgery   TUBAL LIGATION  04/28/1977     Social History:   reports that she quit smoking about 17 years ago. Her smoking use included cigarettes. She started smoking about 60 years ago. She has a 10.75 pack-year smoking history. She has never used smokeless tobacco. She reports that she does not currently use alcohol. She reports that she does not use drugs.   Family History:  Her family history includes Diabetes in her brother, father, maternal aunt, maternal grandfather, maternal grandmother, maternal uncle, mother, and sister; Healthy in her daughter, son, and son; Heart disease in her father and mother; Hypertension in her brother and mother; Sickle cell anemia in her brother, sister, and sister; Stroke in her mother.   Allergies Allergies  Allergen Reactions   Cefepime Other (See Comments)    Causes encephalitis - reported by [facility] 12/31/2019   Lactose Intolerance (Gi)     Per pt's daughter causes gas     Home Medications  Prior to Admission medications   Medication Sig Start Date End Date Taking? Authorizing Provider  acetaminophen (TYLENOL) 325 MG tablet Take 1-2 tablets (325-650 mg total) by mouth every 4 (four) hours as needed for mild pain. 06/06/22  Yes Setzer, Lynnell Jude, PA-C  acetaminophen (TYLENOL) 500 MG tablet Take 500 mg by mouth every 6 (six) hours as needed for mild pain or moderate pain.   Yes [provider]  amLODipine (NORVASC) 2.5 MG tablet Take 2.5 mg by mouth daily. 09/13/20  Yes [provider]  atorvastatin (LIPITOR) 20 MG tablet Take  1 tablet (20 mg total) by mouth daily. 07/08/22  Yes Dorothyann Peng, MD  AZO-CRANBERRY PO Take 1 capsule by mouth daily at 6 (six) AM.   Yes [provider]  calcitRIOL (ROCALTROL) 0.25 MCG capsule Take 0.25 mcg by mouth daily. 08/27/20  Yes [provider]  cefpodoxime (VANTIN) 100 MG tablet Take 1 tablet (100 mg total) by mouth 2 (two) times daily for 7 days. 11/14/22 11/21/22 Yes Loetta Rough, MD  cetirizine (ZYRTEC) 10 MG tablet Take 10 mg by mouth daily.   Yes [provider]  [employer] VITAMIN B12 1000 MCG tablet TAKE 1 TABLET BY MOUTH EVERY DAY 08/04/22  Yes Dorothyann Peng, MD  FLUoxetine (PROZAC) 10 MG capsule TAKE 1 CAPSULE BY MOUTH EVERY DAY Patient taking differently: Take 10 mg by mouth daily. 09/08/22  Yes Dorothyann Peng, MD  folic acid (FOLVITE) 1 MG tablet TAKE 1 TABLET BY MOUTH EVERY  DAY 08/01/22  Yes Dorothyann Peng, MD  Glycerin-Polysorbate 80 (REFRESH DRY EYE THERAPY OP) Place 1 drop into both eyes daily as needed (dry eyes).   Yes [provider]  insulin detemir (LEVEMIR FLEXTOUCH) 100 UNIT/ML FlexPen Inject 10 Units into the skin daily. 06/06/22  Yes Setzer, Lynnell Jude, PA-C  lidocaine (LIDODERM) 5 % Place 3 patches onto the skin daily. Remove & Discard patch within 12 hours or as directed by MD 06/06/22  Yes Setzer, Lynnell Jude, PA-C  metoCLOPramide (REGLAN) 10 MG tablet Take 1 tablet (10 mg total) by mouth every 6 (six) hours. 11/14/22  Yes Lorre Nick, MD  metoprolol tartrate (LOPRESSOR) 50 MG tablet Take 1 tablet (50 mg total) by mouth 2 (two) times daily. 07/08/22  Yes Dorothyann Peng, MD  Multiple Vitamins-Minerals (CENTRUM SILVER PO) Take by mouth daily.   Yes [provider]  ondansetron (ZOFRAN-ODT) 4 MG disintegrating tablet Take 1 tablet (4 mg total) by mouth every 8 (eight) hours as needed for nausea or vomiting. 11/14/22  Yes Loetta Rough, MD  sodium bicarbonate 650 MG tablet Take 1 tablet (650 mg total) by mouth 2 (two) times daily.  06/06/22  Yes Setzer, Lynnell Jude, PA-C  BD INSULIN SYRINGE U/F 31G X 5/16" 0.3 ML MISC See admin instructions. use with insulin 03/17/20   [provider]  diclofenac Sodium (VOLTAREN) 1 % GEL Apply 2 g topically 4 (four) times daily. Apply to left knee. Patient not taking: Reported on 11/16/2022 09/12/21   Arnette Felts, FNP  epoetin alfa (PROCRIT) 16109 UNIT/ML injection Every other week Patient not taking: Reported on 11/16/2022    [provider]  gabapentin (NEURONTIN) 100 MG capsule TAKE 1 CAPSULE BY MOUTH AT BEDTIME Patient taking differently: Take 100 mg by mouth at bedtime. 02/24/22   Dorothyann Peng, MD  Insulin Pen Needle (BD PEN NEEDLE NANO U/F) 32G X 4 MM MISC Use as directed with insulin pen 06/23/22   Dorothyann Peng, MD  OneTouch Delica Lancets 30G MISC See admin instructions. 02/09/21   [provider]  senna-docusate (SENOKOT-S) 8.6-50 MG tablet Take 2 tablets by mouth at bedtime as needed for mild constipation. Patient not taking: Reported on 11/16/2022 06/06/22   Valetta Fuller, Lynnell Jude, PA-C  simethicone (MYLICON) 80 MG chewable tablet Chew 1 tablet (80 mg total) by mouth every 6 (six) hours as needed for flatulence. Patient not taking: Reported on 11/16/2022 06/06/22   Milinda Antis, PA-C     Critical care time: 2m        Cyril Mourning MD. [facility] [place] [facility] Pager : [phone]  If no response to pager , please call [phone] until 7 pm After 7:00 pm call Elink  [phone]   11/16/2022

## 2022-11-16 NOTE — Progress Notes (Signed)
Pharmacy Antibiotic Note  Miranda Roman is a 77 y.o. female admitted on 11/07/2022 with sepsis.  Pharmacy has been consulted for Vanco dosing.  ID: sepsis, UTI -Consult for "ESBL infection," only see h/o pseudomonas in urine,   Antimicrobials this admission:  Meropenem 5/12 >> Vanco 5/12>>  Dose adjustments this admission:  5/12 CRRT dosing adjustment  Microbiology results:  5/11 BCx:  5/10 UA: many bacteria, 0-5 Epith, 21-50 WBC 5/10 UCx: >100k Klebsiella pneumoniae (sens pending) 5/12 UA: no bacteria, 0-5 Epith, 21-50 WBC 5/12 UCx:   5/12 MRSA PCR: not detected  Plan: Vanco 20mg /kg = 1500mg  x 1 then 1000mg  IV q24h on CRRT    Height: 5\' 4"  (162.6 cm) Weight: 76.2 kg (167 lb 15.9 oz) IBW/kg (Calculated) : 54.7  Temp (24hrs), Avg:95.1 F (35.1 C), Min:92 F (33.3 C), Max:98.7 F (37.1 C)  Recent Labs  Lab 11/14/22 0923 11/14/22 1112 11/14/22 1116 11/14/22 1124 11/09/2022 2345 11/16/22 0125 11/16/22 0129 11/16/22 0410 11/16/22 0631 11/16/22 1003 11/16/22 1005  WBC 11.0*  --   --   --   --   --  18.9*  --   --  16.1*  --   CREATININE  --  2.66*  --  3.00*  --  4.14*  --   --  4.22* 4.19*  --   LATICACIDVEN 2.7*  --  1.7  --  >9.0* >9.0*  --  >9.0*  --   --  >9.0*    Estimated Creatinine Clearance: 11.6 mL/min (A) (by C-G formula based on SCr of 4.19 mg/dL (H)).    Allergies  Allergen Reactions   Cefepime Other (See Comments)    Causes encephalitis - reported by [facility] 12/31/2019   Lactose Intolerance (Gi)     Per pt's daughter causes gas   Marko Skalski S. Merilynn Finland, PharmD, BCPS Clinical Staff Pharmacist [email or web address]  Pasty Spillers 11/16/2022 3:01 PM

## 2022-11-16 NOTE — Progress Notes (Signed)
PHARMACY NOTE:  ANTIMICROBIAL RENAL DOSAGE ADJUSTMENT  Current antimicrobial regimen includes a mismatch between antimicrobial dosage and estimated renal function.  As per policy approved by the Pharmacy & Therapeutics and Medical Executive Committees, the antimicrobial dosage will be adjusted accordingly.  Current antimicrobial dosage:  Meropenem 1g IV q12h  Indication: urosepsis  Renal Function:  Estimated Creatinine Clearance: 11.5 mL/min (A) (by C-G formula based on SCr of 4.22 mg/dL (H)).  [x]      On CRRT - starting 5/12    Antimicrobial dosage has been changed to:  Meropenem 1g IV q8h    Thank you for allowing pharmacy to be a part of this patient's care.  Lynann Beaver PharmD, BCPS [facility] main pharmacy [phone] 11/16/2022 11:10 AM

## 2022-11-16 NOTE — Progress Notes (Signed)
Critically ill, remains on 3 mics of Levophed. Mental status is poor, moans and groans, opens eyes, able to maintain airway. Nursing was unable to start CRRT due to urine line, change machine and still unable to get flows.  Catheter seems to draw blood easily and flushed easily. Repeat labs show potassium 4.5, creatinine decreased to 4.06, hemoglobin stable at 8.2, platelets remain low. Lactate is not clearing likely due to shock liver, abdomen remains benign Renal ultrasound does not show hydronephrosis. Vitamin K given for coagulopathy, if develops obvious bleeding will give FFP and platelets  Updated daughter. Discussed with renal -okay to hold off on CRRT since she is starting to make some urine and potassium is decreased.  Continue bicarbonate drip, if develops hyperkalemia again, can transfer to [facility] for regular HD  My additional critical care time was 30 minutes  Deondrea Aguado V. Vassie Loll MD

## 2022-11-16 NOTE — ED Notes (Signed)
Called to lab to inquire about why the labs were not released and was told that they need a recollect on the light green, almost 2 hours later

## 2022-11-16 NOTE — Progress Notes (Addendum)
Secure chatted B[facility]N inquiring about follow up 3rd Lactic, stated she would ask the MD.

## 2022-11-16 NOTE — Progress Notes (Deleted)
A consult was received from an ED physician for Meropenem per pharmacy dosing.  The patient's profile has been reviewed for ht/wt/allergies/indication/available labs.    A one time order has been placed for Meropenem 1gm IV.    Further antibiotics/pharmacy consults should be ordered by admitting physician if indicated.                       Thank you, Maryellen Pile, PharmD 11/16/2022  12:25 AM

## 2022-11-16 NOTE — ED Notes (Signed)
Patient unable to swallow liquids had to suction mouth from drooling and not swallowing saliva, provider made aware

## 2022-11-16 NOTE — Progress Notes (Signed)
Pt being followed by ELink for Sepsis protocol. 

## 2022-11-16 NOTE — Progress Notes (Signed)
A consult was placed to the hospital's IV Nurse for additional IV access for vasopressors; upon arrival to the pt's room, a hemodialysis catheter with a pigtail was being placed;  pt also has 2 USG peripheral ivs that were placed earlier by IV Team.  Will hold any further iv attempts at this time. RN to consult IV Therapy if more access is needed.

## 2022-11-16 NOTE — ED Notes (Signed)
Light green was sent to lab at 2345 and again at 0125 did not get results until 0417 from lab

## 2022-11-16 NOTE — ED Provider Notes (Signed)
[place] [facility] AT [facility] Provider Note   CSN: [id number] Arrival date & time: 11/25/2022  2316     History  Chief Complaint  Patient presents with   Altered Mental Status    Miranda Roman is a 77 y.o. female.  The history is provided by a relative. The history is limited by the condition of the patient (Altered mental status).  Altered Mental Status She was in the emergency department yesterday and diagnosed with a UTI and sent home with prescription for cefpodoxime.  Today, she has not eaten or drinking and has become more lethargic although she is still able to talk and answer questions.  She was known to have a fever at home or have any chills or sweats.   Home Medications Prior to Admission medications   Medication Sig Start Date End Date Taking? Authorizing Provider  acetaminophen (TYLENOL) 325 MG tablet Take 1-2 tablets (325-650 mg total) by mouth every 4 (four) hours as needed for mild pain. 06/06/22   Setzer, Lynnell Jude, PA-C  amLODipine (NORVASC) 2.5 MG tablet Take 2.5 mg by mouth daily. 09/13/20   [provider]  atorvastatin (LIPITOR) 20 MG tablet Take 1 tablet (20 mg total) by mouth daily. 07/08/22   Dorothyann Peng, MD  AZO-CRANBERRY PO Take 1 capsule by mouth daily at 6 (six) AM.    [provider]  BD INSULIN SYRINGE U/F 31G X 5/16" 0.3 ML MISC See admin instructions. use with insulin 03/17/20   [provider]  calcitRIOL (ROCALTROL) 0.25 MCG capsule Take 0.25 mcg by mouth daily. 08/27/20   [provider]  cefpodoxime (VANTIN) 100 MG tablet Take 1 tablet (100 mg total) by mouth 2 (two) times daily for 7 days. 11/14/22 11/21/22  Loetta Rough, MD  cetirizine (ZYRTEC) 10 MG tablet Take 10 mg by mouth daily.    [provider]  [employer] VITAMIN B12 1000 MCG tablet TAKE 1 TABLET BY MOUTH EVERY DAY 08/04/22   Dorothyann Peng, MD  diclofenac Sodium (VOLTAREN) 1 % GEL Apply 2 g topically 4 (four) times daily.  Apply to left knee. Patient taking differently: Apply 2 g topically 4 (four) times daily. Apply to left knee 09/12/21   Arnette Felts, FNP  epoetin alfa (PROCRIT) 78295 UNIT/ML injection Every other week    [provider]  FLUoxetine (PROZAC) 10 MG capsule TAKE 1 CAPSULE BY MOUTH EVERY DAY 09/08/22   Dorothyann Peng, MD  folic acid (FOLVITE) 1 MG tablet TAKE 1 TABLET BY MOUTH EVERY DAY 08/01/22   Dorothyann Peng, MD  gabapentin (NEURONTIN) 100 MG capsule TAKE 1 CAPSULE BY MOUTH AT BEDTIME Patient taking differently: Take 100 mg by mouth at bedtime. 02/24/22   Dorothyann Peng, MD  insulin detemir (LEVEMIR FLEXTOUCH) 100 UNIT/ML FlexPen Inject 10 Units into the skin daily. 06/06/22   Setzer, Lynnell Jude, PA-C  Insulin Pen Needle (BD PEN NEEDLE NANO U/F) 32G X 4 MM MISC Use as directed with insulin pen 06/23/22   Dorothyann Peng, MD  lidocaine (LIDODERM) 5 % Place 3 patches onto the skin daily. Remove & Discard patch within 12 hours or as directed by MD 06/06/22   Valetta Fuller, Lynnell Jude, PA-C  metoCLOPramide (REGLAN) 10 MG tablet Take 1 tablet (10 mg total) by mouth every 6 (six) hours. 11/14/22   Lorre Nick, MD  metoprolol tartrate (LOPRESSOR) 50 MG tablet Take 1 tablet (50 mg total) by mouth 2 (two) times daily. 07/08/22   Dorothyann Peng, MD  Multiple Vitamins-Minerals (CENTRUM SILVER PO) Take by mouth daily.    [provider]  ondansetron (ZOFRAN-ODT) 4 MG disintegrating tablet Take 1 tablet (4 mg total) by mouth every 8 (eight) hours as needed for nausea or vomiting. 11/14/22   Loetta Rough, MD  OneTouch Delica Lancets 30G MISC See admin instructions. 02/09/21   [provider]  senna-docusate (SENOKOT-S) 8.6-50 MG tablet Take 2 tablets by mouth at bedtime as needed for mild constipation. 06/06/22   Setzer, Lynnell Jude, PA-C  simethicone (MYLICON) 80 MG chewable tablet Chew 1 tablet (80 mg total) by mouth every 6 (six) hours as needed for flatulence. 06/06/22   Setzer, Lynnell Jude, PA-C  sodium  bicarbonate 650 MG tablet Take 1 tablet (650 mg total) by mouth 2 (two) times daily. 06/06/22   Setzer, Lynnell Jude, PA-C      Allergies    Cefepime and Lactose intolerance (gi)    Review of Systems   Review of Systems  Unable to perform ROS: Mental status change    Physical Exam Updated Vital Signs BP (!) 91/54 (BP Location: Left Arm)   Pulse 71   Temp (!) 96.9 F (36.1 C) (Rectal)   Resp (!) 31   Ht 5\' 4"  (1.626 m)   Wt 76.2 kg   SpO2 100%   BMI 28.84 kg/m  Physical Exam Vitals and nursing note reviewed.   Ill-appearing 77 year old female, resting comfortably and in no acute distress. Vital signs are significant for low temperature, elevated respiratory rate, and low blood pressure. Oxygen saturation is 100%, which is normal. Head is normocephalic and atraumatic. PERRLA, EOMI. Oropharynx is clear. Neck is nontender and supple without adenopathy or JVD. Back is nontender and there is no CVA tenderness. Lungs are clear without rales, wheezes, or rhonchi. Chest is nontender. Heart has regular rate and rhythm without murmur. Abdomen is soft, flat, nontender. Extremities have no cyanosis or edema, full range of motion is present. Skin is warm and dry without rash. Neurologic: Lethargic but able to answer questions.  Cranial nerves are grossly intact.  Moves all extremities equally.  ED Results / Procedures / Treatments   Labs (all labs ordered are listed, but only abnormal results are displayed) Labs Reviewed  LACTIC ACID, PLASMA - Abnormal; Notable for the following components:      Result Value   Lactic Acid, Venous >9.0 (*)    All other components within normal limits  LACTIC ACID, PLASMA - Abnormal; Notable for the following components:   Lactic Acid, Venous >9.0 (*)    All other components within normal limits  URINALYSIS, W/ REFLEX TO CULTURE (INFECTION SUSPECTED) - Abnormal; Notable for the following components:   Glucose, UA 150 (*)    Hgb urine dipstick MODERATE  (*)    Protein, ur >=300 (*)    Leukocytes,Ua MODERATE (*)    All other components within normal limits  COMPREHENSIVE METABOLIC PANEL - Abnormal; Notable for the following components:   Potassium >7.5 (*)    CO2 <7 (*)    Glucose, Bld 277 (*)    BUN 54 (*)    Creatinine, Ser 4.14 (*)    Calcium 8.1 (*)    Total Protein 5.9 (*)    Albumin 2.7 (*)    AST 3,313 (*)    ALT 3,613 (*)    Alkaline Phosphatase 172 (*)    Total Bilirubin 1.8 (*)    GFR, Estimated 11 (*)    All other components within normal  limits  CBC WITH DIFFERENTIAL/PLATELET - Abnormal; Notable for the following components:   WBC 18.9 (*)    RBC 2.76 (*)    Hemoglobin 6.6 (*)    HCT 22.3 (*)    MCH 23.9 (*)    MCHC 29.6 (*)    Platelets 35 (*)    nRBC 2.4 (*)    Neutro Abs 14.2 (*)    Monocytes Absolute 2.2 (*)    Abs Immature Granulocytes 0.69 (*)    All other components within normal limits  VITAMIN B12 - Abnormal; Notable for the following components:   Vitamin B-12 5,235 (*)    All other components within normal limits  IRON AND TIBC - Abnormal; Notable for the following components:   Iron 176 (*)    TIBC 136 (*)    Saturation Ratios 130 (*)    All other components within normal limits  FERRITIN - Abnormal; Notable for the following components:   Ferritin >7,500 (*)    All other components within normal limits  RETICULOCYTES - Abnormal; Notable for the following components:   RBC. 2.43 (*)    Immature Retic Fract 22.5 (*)    All other components within normal limits  LACTIC ACID, PLASMA - Abnormal; Notable for the following components:   Lactic Acid, Venous >9.0 (*)    All other components within normal limits  POC OCCULT BLOOD, ED - Abnormal; Notable for the following components:   Fecal Occult Bld POSITIVE (*)    All other components within normal limits  CBG MONITORING, ED - Abnormal; Notable for the following components:   Glucose-Capillary 232 (*)    All other components within normal  limits  CULTURE, BLOOD (ROUTINE X 2)  CULTURE, BLOOD (ROUTINE X 2)  URINE CULTURE  FOLATE  CBC WITH DIFFERENTIAL/PLATELET  PROTIME-INR  CBC WITH DIFFERENTIAL/PLATELET  COMPREHENSIVE METABOLIC PANEL  URIC ACID  TYPE AND SCREEN  PREPARE RBC (CROSSMATCH)   Radiology DG Chest Port 1 View  Result Date: 11/16/2022 CLINICAL DATA:  Lethargic and altered mental status. EXAM: PORTABLE CHEST 1 VIEW COMPARISON:  June 01, 2022 FINDINGS: The heart size and mediastinal contours are within normal limits. Low lung volumes are noted. Both lungs are clear. The visualized skeletal structures are unremarkable. IMPRESSION: No active disease. Electronically Signed   By: Aram Candela M.D.   On: 11/16/2022 01:05   CT ABDOMEN PELVIS WO CONTRAST  Result Date: 11/14/2022 CLINICAL DATA:  Acute generalized abdominal pain. EXAM: CT ABDOMEN AND PELVIS WITHOUT CONTRAST TECHNIQUE: Multidetector CT imaging of the abdomen and pelvis was performed following the standard protocol without IV contrast. RADIATION DOSE REDUCTION: This exam was performed according to the departmental dose-optimization program which includes automated exposure control, adjustment of the mA and/or kV according to patient size and/or use of iterative reconstruction technique. COMPARISON:  May 16, 2022.  April 25, 2020. FINDINGS: Lower chest: Minimal bibasilar subsegmental atelectasis. 8 mm nodule is noted in right middle lobe which is unchanged compared to prior exam of 2023 and slightly enlarged compared to prior exam of 2021. Hepatobiliary: Minimal cholelithiasis. No biliary dilatation is noted. Liver is unremarkable. Pancreas: Stable 9 mm cyst seen in pancreatic tail. No pancreatic ductal dilatation or surrounding inflammatory changes. Spleen: Normal in size without focal abnormality. Adrenals/Urinary Tract: Adrenal glands appear normal. Bilateral nonobstructive nephrolithiasis is noted. Stable right renal cyst is noted for which no  further follow-up is required. No hydronephrosis or renal obstruction is noted. Urinary bladder is unremarkable. Stomach/Bowel: Status post gastric bypass.  There is no evidence of bowel obstruction or inflammation. Sigmoid diverticulosis is noted without inflammation. The appendix is unremarkable. Vascular/Lymphatic: Aortic atherosclerosis. No enlarged abdominal or pelvic lymph nodes. Reproductive: Uterus and bilateral adnexa are unremarkable. Other: No abdominal wall hernia or abnormality. No abdominopelvic ascites. 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 No acute osseous abnormality is noted. IMPRESSION: 8 mm right middle lobe nodule is noted which is slightly enlarged compared to prior exam of 2021. Follow-up CT scan in 12 months is recommended to ensure stability. Stable 9 mm cyst seen in pancreatic tail. Minimal cholelithiasis. Bilateral nonobstructive nephrolithiasis. Status post gastric bypass. Sigmoid diverticulosis without inflammation. 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 Aortic Atherosclerosis (ICD10-I70.0). Electronically Signed   By: Lupita Raider M.D.   On: 11/14/2022 12:21    Procedures Procedures  Cardiac monitor shows normal sinus rhythm, per my interpretation.  Medications Ordered in ED Medications  sodium zirconium cyclosilicate (LOKELMA) packet 10 g (10 g Oral Not Given 11/16/22 0500)  lactated ringers bolus 1,000 mL (1,000 mLs Intravenous Not Given 11/16/22 0706)  sodium bicarbonate 150 mEq in dextrose 5 % 1,150 mL infusion ( Intravenous New Bag/Given 11/16/22 0459)  lactated ringers bolus 1,000 mL (0 mLs Intravenous Stopped 11/16/22 0212)  cefTRIAXone (ROCEPHIN) 2 g in sodium chloride 0.9 % 100 mL IVPB (0 g Intravenous  Stopped 11/16/22 0138)  lactated ringers bolus 1,500 mL (0 mLs Intravenous Stopped 11/16/22 0246)  0.9 %  sodium chloride infusion (Manually program via Guardrails IV Fluids) ( Intravenous New Bag/Given 11/16/22 0423)  calcium gluconate 1 g/ 50 mL sodium chloride IVPB (0 mg Intravenous Stopped 11/16/22 0500)  albuterol (PROVENTIL) (2.5 MG/3ML) 0.083% nebulizer solution 10 mg (10 mg Nebulization Given 11/16/22 0443)  insulin aspart (novoLOG) injection 5 Units (5 Units Intravenous Given 11/16/22 0444)  sodium bicarbonate injection 50 mEq (50 mEq Intravenous Given 11/16/22 0436)    ED Course/ Medical Decision Making/ A&P                             Medical Decision Making Amount and/or Complexity of Data Reviewed Labs: ordered. Radiology: ordered.  Risk OTC drugs. Prescription drug management. Decision regarding hospitalization.   Hypotension and hypothermia concerning for sepsis.  I have activated code sepsis and I have ordered sepsis labs.  I have reviewed her past records, and in the ED yesterday, urinalysis showed evidence of infection, she had stable renal insufficiency, stable anemia.  CT of abdomen and pelvis was unremarkable.  I have ordered IV fluids and started patient on sepsis pathway.  I have ordered a dose of ceftriaxone for presumed urinary tract infection with sepsis.  I have confirmed with patient's daughter that she is full code.  Chest x-ray shows no acute process.  Have independently viewed the image, and agree with radiologist's interpretation.  I have reviewed and interpreted her laboratory tests, and my interpretation is acute on chronic renal failure with severe metabolic acidosis and severe hyperkalemia, markedly elevated transaminases new compared with 11/14/2022, elevated alkaline phosphatase and total bilirubin which are also new compared with 11/14/2022, severe anemia with 4 g drop in hemoglobin compared with 11/14/2022, thrombocytopenia which is new, severe leukocytosis  which is nonspecific, markedly elevated lactic acid which has persisted despite IV  hydration and blood transfusion.  Urinalysis continues to show 21-50 RBCs and WBCs but no bacteria seen suggesting that her urinary infection actually had been treated appropriately.  Stool Hemoccult was positive but stool was not grossly bloody or melanotic.  I have ordered blood transfusion as well as aggressive IV hydration.  Patient's blood pressure has remained borderline in spite of this.  I treated her hyperkalemia aggressively with intravenous calcium and sodium bicarbonate, albuterol nebulizer treatment, insulin.  She was not able to tolerate oral sodium zirconium cyclosilicate.  I discussed case with Dr. Valentino Nose of nephrology service who feels that she likely would do well with correction of acidosis, did not feel she was likely to need dialysis but he will see the patient in consultation.  I have discussed case with Dr. Celine Mans of pulmonary critical care service who agrees to admit the patient.  I have explained to family that patient is critically ill and that her condition is life-threatening.  Specific cause for all of her problems is not clear.  Consider DIC, consider hemolysis, consider hepatorenal syndrome, consider shock liver.  CRITICAL CARE Performed by: Dione Booze Total critical care time: 330 minutes Critical care time was exclusive of separately billable procedures and treating other patients. Critical care was necessary to treat or prevent imminent or life-threatening deterioration. Critical care was time spent personally by me on the following activities: development of treatment plan with patient and/or surrogate as well as nursing, discussions with consultants, evaluation of patient's response to treatment, examination of patient, obtaining history from patient or surrogate, ordering and performing treatments and interventions, ordering and review of laboratory studies, ordering and review of  radiographic studies, pulse oximetry and re-evaluation of patient's condition.  Final Clinical Impression(s) / ED Diagnoses Final diagnoses:  Shock (HCC)  Anemia, blood loss  Acute renal failure superimposed on chronic kidney disease, unspecified acute renal failure type, unspecified CKD stage (HCC)  Elevated transaminase level    Rx / DC Orders ED Discharge Orders     None         Dione Booze, MD 11/16/22 [phone]

## 2022-11-16 NOTE — Progress Notes (Addendum)
Pharmacy Antibiotic Note  Miranda Roman is a 77 y.o. female admitted on 11/14/2022 with sepsis and UTI. Pharmacy has been consulted for meropenem dosing.  Plan: -Meropenem 1 g IV q12h -Monitor renal function closely - would extend interval to q24h if renal function continues to decline  Height: 5\' 4"  (162.6 cm) Weight: 76.2 kg (167 lb 15.9 oz) IBW/kg (Calculated) : 54.7  Temp (24hrs), Avg:93.8 F (34.3 C), Min:92 F (33.3 C), Max:96.9 F (36.1 C)  Recent Labs  Lab 11/14/22 0923 11/14/22 1112 11/14/22 1116 11/14/22 1124 11/25/2022 2345 11/16/22 0125 11/16/22 0129 11/16/22 0410 11/16/22 0631  WBC 11.0*  --   --   --   --   --  18.9*  --   --   CREATININE  --  2.66*  --  3.00*  --  4.14*  --   --  4.22*  LATICACIDVEN 2.7*  --  1.7  --  >9.0* >9.0*  --  >9.0*  --     Estimated Creatinine Clearance: 11.5 mL/min (A) (by C-G formula based on SCr of 4.22 mg/dL (H)).    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  Pricilla Riffle, PharmD, BCPS Clinical Pharmacist 11/16/2022 7:48 AM

## 2022-11-16 NOTE — Consult Note (Addendum)
Nephrology Consult   Requesting provider: Dione Booze Service requesting consult: ER Reason for consult: AKI, hyperkalemia, acidosis   Assessment/Recommendations: Miranda Roman is a/an 77 y.o. female with a past medical history HTN, DM2, HLD, CKD 4, pulmonary hypertension  who present w/ shock   Unstable AKI on CKD 4: Baseline creatinine around 2.5-2.8.  Creatinine up to 4 today.  Also with low urine output in Foley bag.  Urine does contain RBCs and proteinuria but hard to interpret in the setting of known UTI.  Primary GN unlikely and this is most likely secondary to ATN from underlying shock.  However, could consider GN workup in the future -Continue with aggressive IV fluids -Will consider CRRT later today pending her progress -Electrolyte management as below -Continue to monitor daily Cr, Dose meds for GFR -Monitor Daily I/Os, Daily weight  -Maintain MAP>65 for optimal renal perfusion; consider targeting a higher MAP given her ongoing shock -Avoid nephrotoxic medications including NSAIDs -Use synthetic opioids (Fentanyl/Dilaudid) if needed  Hyperkalemia: Mainly driven by AKI and acidosis.  Hemolysis also a concern.  Agree with efforts so far by ER with [facility] and shifting medications.  Also start IV bicarbonate as correction of acidosis will likely help.  Consider renal replacement therapy later today  Severe anion gap lactic acidosis: Lactic acid undetectably elevated.  Continue with fluid administration consult critical care for assistance in shock.  Shock: Likely septic.  Multisystem organ failure.  Recommend consideration of pressors per primary.  Continue with antibiotics  Acute encephalopathy: Likely sequela of shock but multifactorial.  Management as above  Anemia: Likely hemolytic given significant thrombocytopenia.  Concerning for DIC.  Management per primary team.  Addendum 8:54 am: patient with persistent electrolyte abnormalities.  Discussed with ICU team and agree we  are starting CRRT.  Orders placed with 2K bath.  High DFR.  If the patient's hyperkalemia remains refractory we can consider 0K    Recommendations conveyed to primary service.    Darnell Level [facility] 11/16/2022 6:46 AM   _____________________________________________________________________________________ CC: Confusion  History of Present Illness: Miranda Roman is a/an 77 y.o. female with a past medical history of HTN, DM2, HLD, CKD 4, pulmonary hypertension who presents with confusion.  Patient was altered on my interview so history was obtained from the patient's daughter as well as chart review.  The patient initially presented to the hospital on 11/14/2022 because of nausea and vomiting as well as back pain.  This has been somewhat new onset for a couple days prior to arrival.  She was having difficulty drinking and eating because the nausea and vomiting.  No obvious fevers or chills.  Denied any shortness of breath or chest pain.  No obvious bloody stool at that time.  She also was noted to be very weak.  In the emergency department she was found to have pyuria.  She had a CT scan without any significant concerns but with some chronic changes.  She was discharged with cefpodoxime.  Urine culture returned with Klebsiella pneumonia.  Awaiting sensitivities.  Patient went home and had persistent nausea and vomiting.  Daughter also noted that the patient was very cold.  She also noted significantly low blood pressure at home.  This is when she was brought back to the emergency department.  Creatinine has fluctuated significantly in the past.  Most recently her baseline creatinine has been around 2.5-2.8.  Creatinine was 2.7 on 11/14/2022 and repeat that same day was 3.  Creatinine was 4.1  on arrival.  Other labs significant for potassium of 7.5, bicarb that was undetectable, AST and ALT both above 3000.  Lactic acid undetectably high greater than 9.  Ferritin also  undetectably elevated.  CBC with significant drop in hemoglobin from 11.6 on 5/10-6.6 today.  Leukocytosis also present.  Thrombocytopenia also present with platelets of 35.   Medications:  Current Facility-Administered Medications  Medication Dose Route Frequency Provider Last Rate Last Admin   lactated ringers bolus 1,000 mL  1,000 mL Intravenous Once Dione Booze, MD       sodium bicarbonate 150 mEq in dextrose 5 % 1,150 mL infusion   Intravenous Continuous Darnell Level, MD 125 mL/hr at 11/16/22 0459 New Bag at 11/16/22 0459   sodium zirconium cyclosilicate (LOKELMA) packet 10 g  10 g Oral Once Dione Booze, MD       Current Outpatient Medications  Medication Sig Dispense Refill   acetaminophen (TYLENOL) 325 MG tablet Take 1-2 tablets (325-650 mg total) by mouth every 4 (four) hours as needed for mild pain.     amLODipine (NORVASC) 2.5 MG tablet Take 2.5 mg by mouth daily.     atorvastatin (LIPITOR) 20 MG tablet Take 1 tablet (20 mg total) by mouth daily. 90 tablet 1   AZO-CRANBERRY PO Take 1 capsule by mouth daily at 6 (six) AM.     BD INSULIN SYRINGE U/F 31G X 5/16" 0.3 ML MISC See admin instructions. use with insulin     calcitRIOL (ROCALTROL) 0.25 MCG capsule Take 0.25 mcg by mouth daily.     cefpodoxime (VANTIN) 100 MG tablet Take 1 tablet (100 mg total) by mouth 2 (two) times daily for 7 days. 14 tablet 0   cetirizine (ZYRTEC) 10 MG tablet Take 10 mg by mouth daily.     [employer] VITAMIN B12 1000 MCG tablet TAKE 1 TABLET BY MOUTH EVERY DAY 30 tablet 0   diclofenac Sodium (VOLTAREN) 1 % GEL Apply 2 g topically 4 (four) times daily. Apply to left knee. (Patient taking differently: Apply 2 g topically 4 (four) times daily. Apply to left knee) 50 g 0   epoetin alfa (PROCRIT) 16109 UNIT/ML injection Every other week     FLUoxetine (PROZAC) 10 MG capsule TAKE 1 CAPSULE BY MOUTH EVERY DAY 90 capsule 1   folic acid (FOLVITE) 1 MG tablet TAKE 1 TABLET BY MOUTH EVERY DAY 90 tablet 1    gabapentin (NEURONTIN) 100 MG capsule TAKE 1 CAPSULE BY MOUTH AT BEDTIME (Patient taking differently: Take 100 mg by mouth at bedtime.) 90 capsule 2   insulin detemir (LEVEMIR FLEXTOUCH) 100 UNIT/ML FlexPen Inject 10 Units into the skin daily. 15 mL 3   Insulin Pen Needle (BD PEN NEEDLE NANO U/F) 32G X 4 MM MISC Use as directed with insulin pen 300 each 2   lidocaine (LIDODERM) 5 % Place 3 patches onto the skin daily. Remove & Discard patch within 12 hours or as directed by MD 30 patch 0   metoCLOPramide (REGLAN) 10 MG tablet Take 1 tablet (10 mg total) by mouth every 6 (six) hours. 30 tablet 0   metoprolol tartrate (LOPRESSOR) 50 MG tablet Take 1 tablet (50 mg total) by mouth 2 (two) times daily. 180 tablet 1   Multiple Vitamins-Minerals (CENTRUM SILVER PO) Take by mouth daily.     ondansetron (ZOFRAN-ODT) 4 MG disintegrating tablet Take 1 tablet (4 mg total) by mouth every 8 (eight) hours as needed for nausea or vomiting. 20 tablet 0   OneTouch  Delica Lancets 30G MISC See admin instructions.     senna-docusate (SENOKOT-S) 8.6-50 MG tablet Take 2 tablets by mouth at bedtime as needed for mild constipation.     simethicone (MYLICON) 80 MG chewable tablet Chew 1 tablet (80 mg total) by mouth every 6 (six) hours as needed for flatulence. 30 tablet 0   sodium bicarbonate 650 MG tablet Take 1 tablet (650 mg total) by mouth 2 (two) times daily. 60 tablet 0     ALLERGIES Cefepime and Lactose intolerance (gi)  MEDICAL HISTORY Past Medical History:  Diagnosis Date   Abnormal CT of the chest 12/28/2018   Acute encephalopathy 01/13/2020   Altered mental status    Anemia of chronic renal failure, stage 4 (severe) 01/04/2022   Aortic valve regurgitation 02/11/2016   Atherosclerosis of native coronary artery of native heart without angina pectoris 12/24/2012   Mild, non-obstructive CAD by cath in 2007   Atrophic vaginitis 12/15/2018   Atypical squamous cells of undetermined significance on cytologic  smear of cervix (ASC-US) 12/15/2018   Benign essential hypertension 12/24/2012   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   Borderline steroid-induced glaucoma, 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   OD, Early elevated intraocular pressure to 28 mm we will discontinue all topical medications from surgery in the right eye today   Bronchiectasis without complication 02/04/2021   Cataract 07/03/2020   Chest pain at rest 01/30/2015   Chronic kidney disease, stage 4 (severe) 12/07/2020   Closed fracture of right tibial plateau 07/17/2021   Cyst of left kidney 03/10/2019   Needs repeated US 08/2019   Dysphagia, neurologic    Elevated d-dimer 12/20/2019   Facet arthritis of lumbar region 02/27/2019   Gastroesophageal reflux disease 08/26/2016   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  Further history patient suffered possible Wernicke's encephalopathy the past, a type of the vitamin deficiencies, thiamine that can also affect the retina if there is significant damage which may not be recoverable.   Mild neurocognitive disorder due to multiple etiologies 01/23/2022   Osteoporosis 12/15/2018   Overweight with body mass index (BMI) 25.0-29.9 12/15/2018   PAD (peripheral artery disease) 06/06/2019   Pain in right hand 04/27/2020   Pancreatic cyst 06/06/2019   Polyneuropathy due to type 2 diabetes mellitus    Posterior vitreous detachment of left eye 08/05/2021   Proliferative diabetic retinopathy of right eye 08/22/2021   Likely contributory cause of nonclearing  vitreous hemorrhage in the right eye now status post PRP likely to resolve in 2 quiescent PDR, post vitrectomy PRP   Pulmonary hypertension 02/04/2021   Right knee pain 07/10/2021   Sickle-cell trait 12/07/2020   Type II diabetes mellitus 03/10/2012   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   Vitreous hemorrhage of right eye 08/05/2021   Vitrectomy PRP 08-14-2021     SOCIAL HISTORY Social History   Socioeconomic History   Marital status: Widowed    Spouse name: Not on file   Number of children: Not on file   Years of education: 16   Highest  education level: Bachelor's degree (e.g., BA, AB, BS)  Occupational History   Occupation: Retired    Comment: banking  Tobacco Use   Smoking status: Former    Packs/day: 0.25    Years: 43.00    Additional pack years: 0.00    Total pack years: 10.75    Types: Cigarettes    Start date: 04/1962    Quit date: 04/2005    Years since quitting: 17.6   Smokeless tobacco: Never  Vaping Use   Vaping Use: Never used  Substance and Sexual Activity   Alcohol use: Not Currently    Comment: occasionally   Drug use: No   Sexual activity: Not Currently  Other Topics Concern   Not on file  Social History Narrative   Right handed   Lives with Daughter in one story   Social Determinants of Health   Financial Resource Strain: Low Risk  (09/03/2022)   Overall Financial Resource Strain (CARDIA)    Difficulty of Paying Living Expenses: Not hard at all  Food Insecurity: No Food Insecurity (09/03/2022)   Hunger Vital Sign    Worried About Running Out of Food in the Last Year: Never true    Ran Out of Food in the Last Year: Never true  Transportation Needs: No Transportation Needs (09/03/2022)   PRAPARE - Administrator, Civil Service (Medical): No    Lack of Transportation (Non-Medical): No  Physical Activity: Inactive (01/29/2022)   Exercise Vital Sign    Days of Exercise per Week: 0 days    Minutes of  Exercise per Session: 0 min  Stress: No Stress Concern Present (01/29/2022)   [employer] of Occupational Health - Occupational Stress Questionnaire    Feeling of Stress : Not at all  Social Connections: Not on file  Intimate Partner Violence: Not At Risk (05/08/2022)   Humiliation, Afraid, Rape, and Kick questionnaire    Fear of Current or Ex-Partner: No    Emotionally Abused: No    Physically Abused: No    Sexually Abused: No     FAMILY HISTORY Family History  Problem Relation Age of Onset   Diabetes Mother    Heart disease Mother    Hypertension Mother    Stroke Mother    Diabetes Father    Heart disease Father    Diabetes Sister    Diabetes Brother    Hypertension Brother    Sickle cell anemia Brother    Diabetes Maternal Aunt    Diabetes Maternal Uncle    Diabetes Maternal Grandmother    Diabetes Maternal Grandfather    Sickle cell anemia Sister    Sickle cell anemia Sister    Healthy Son    Healthy Son    Healthy Daughter       Review of Systems: Unable to obtain due to the patient's altered mental status  Physical Exam: Vitals:   11/16/22 0500 11/16/22 0530  BP: (!) 95/54 (!) 93/57  Pulse: 75 78  Resp: (!) 30 (!) 32  Temp:    SpO2: 100% 98%   Total I/O In: 2650 [IV Piggyback:2650] Out: -   Intake/Output Summary (Last 24 hours) at 11/16/2022 0646 Last data filed at 11/16/2022 0500 Gross per 24 hour  Intake 2650 ml  Output --  Net 2650 ml   General: Ill-appearing, mild distress HEENT: anicteric sclera, oropharynx clear without lesions CV: Normal rate, no obvious murmur, no edema Lungs: Tachypnea, mild increased work of breathing, clear to auscultation anteriorly Abd: soft,  non-tender, non-distended Skin: no visible lesions or rashes Psych: Confused, not oriented Musculoskeletal: no obvious deformities Neuro: Globally encephalopathic, unable to follow commands, murmurs words but unable to answer questions directly  Test  Results Reviewed Lab Results  Component Value Date   NA 140 11/16/2022   K >7.5 (HH) 11/16/2022   CL 107 11/16/2022   CO2 <7 (L) 11/16/2022   BUN 54 (H) 11/16/2022   CREATININE 4.14 (H) 11/16/2022   GLU 138 03/28/2022   CALCIUM 8.1 (L) 11/16/2022   ALBUMIN 2.7 (L) 11/16/2022   PHOS 3.8 05/18/2022    CBC Recent Labs  Lab 11/14/22 0923 11/14/22 1124 11/16/22 0129  WBC 11.0*  --  18.9*  NEUTROABS  --   --  14.2*  HGB 10.7* 11.6* 6.6*  HCT 34.5* 34.0* 22.3*  MCV 74.5*  --  80.8  PLT 166  --  35*    I have reviewed all relevant outside healthcare records related to the patient's current hospitalization

## 2022-11-16 NOTE — Progress Notes (Signed)
Called for patient regarding hyperkalemia, sepsis, UTI, encephalopathy. Not intubated, not on pressors. Awaiting repeat labs and nephrology evaluation to determine if she needs dialysis (and thus admission to [facility],) vs admit to [facility].   PCCM to follow.

## 2022-11-17 ENCOUNTER — Inpatient Hospital Stay (HOSPITAL_COMMUNITY): Payer: Medicare Other

## 2022-11-17 ENCOUNTER — Telehealth (HOSPITAL_BASED_OUTPATIENT_CLINIC_OR_DEPARTMENT_OTHER): Payer: Self-pay | Admitting: *Deleted

## 2022-11-17 DIAGNOSIS — R54 Age-related physical debility: Secondary | ICD-10-CM | POA: Diagnosis not present

## 2022-11-17 DIAGNOSIS — R578 Other shock: Secondary | ICD-10-CM | POA: Diagnosis not present

## 2022-11-17 DIAGNOSIS — R579 Shock, unspecified: Secondary | ICD-10-CM | POA: Diagnosis not present

## 2022-11-17 DIAGNOSIS — R7881 Bacteremia: Secondary | ICD-10-CM | POA: Diagnosis not present

## 2022-11-17 DIAGNOSIS — I129 Hypertensive chronic kidney disease with stage 1 through stage 4 chronic kidney disease, or unspecified chronic kidney disease: Secondary | ICD-10-CM | POA: Diagnosis not present

## 2022-11-17 DIAGNOSIS — E782 Mixed hyperlipidemia: Secondary | ICD-10-CM | POA: Diagnosis not present

## 2022-11-17 LAB — COMPREHENSIVE METABOLIC PANEL
ALT: 13586 U/L — ABNORMAL HIGH (ref 0–44)
AST: >10000 U/L — ABNORMAL HIGH (ref 15–41)
Albumin: 2.2 g/dL — ABNORMAL LOW (ref 3.5–5.0)
Alkaline Phosphatase: 448 U/L — ABNORMAL HIGH (ref 38–126)
Anion gap: 30 — ABNORMAL HIGH (ref 5–15)
BUN: 54 mg/dL — ABNORMAL HIGH (ref 8–23)
CO2: 7 mmol/L — ABNORMAL LOW (ref 22–32)
Calcium: 6.6 mg/dL — ABNORMAL LOW (ref 8.9–10.3)
Chloride: 103 mmol/L (ref 98–111)
Creatinine, Ser: 4.79 mg/dL — ABNORMAL HIGH (ref 0.44–1.00)
GFR, Estimated: 9 mL/min — ABNORMAL LOW (ref >60)
Glucose, Bld: 198 mg/dL — ABNORMAL HIGH (ref 70–99)
Potassium: 4.1 mmol/L (ref 3.5–5.1)
Sodium: 140 mmol/L (ref 135–145)
Total Bilirubin: 2.5 mg/dL — ABNORMAL HIGH (ref 0.3–1.2)
Total Protein: 5.2 g/dL — ABNORMAL LOW (ref 6.5–8.1)

## 2022-11-17 LAB — BPAM FFP
Blood Product Expiration Date: 202405132359
Blood Product Expiration Date: 202405182359
Blood Product Expiration Date: 202405182359
Blood Product Expiration Date: 202405182359
Blood Product Expiration Date: 202405182359
ISSUE DATE / TIME: 202405130649
ISSUE DATE / TIME: 202405131045
Unit Type and Rh: 600
Unit Type and Rh: 6200
Unit Type and Rh: 6200
Unit Type and Rh: 6200
Unit Type and Rh: 6200
Unit Type and Rh: 6200

## 2022-11-17 LAB — BPAM CRYOPRECIPITATE
Blood Product Expiration Date: 202405131549
ISSUE DATE / TIME: 202405130953
Unit Type and Rh: 6200

## 2022-11-17 LAB — RENAL FUNCTION PANEL
Albumin: 2.2 g/dL — ABNORMAL LOW (ref 3.5–5.0)
Anion gap: 31 — ABNORMAL HIGH (ref 5–15)
BUN: 55 mg/dL — ABNORMAL HIGH (ref 8–23)
CO2: 7 mmol/L — ABNORMAL LOW (ref 22–32)
Calcium: 6.5 mg/dL — ABNORMAL LOW (ref 8.9–10.3)
Chloride: 102 mmol/L (ref 98–111)
Creatinine, Ser: 4.76 mg/dL — ABNORMAL HIGH (ref 0.44–1.00)
GFR, Estimated: 9 mL/min — ABNORMAL LOW (ref >60)
Glucose, Bld: 198 mg/dL — ABNORMAL HIGH (ref 70–99)
Phosphorus: 9.9 mg/dL — ABNORMAL HIGH (ref 2.5–4.6)
Potassium: 4.1 mmol/L (ref 3.5–5.1)
Sodium: 140 mmol/L (ref 135–145)

## 2022-11-17 LAB — BPAM PLATELET PHERESIS
Blood Product Expiration Date: 202405152359
Unit Type and Rh: 6200

## 2022-11-17 LAB — TYPE AND SCREEN
Antibody Screen: NEGATIVE
Unit division: 0
Unit division: 0
Unit division: 0

## 2022-11-17 LAB — PREPARE FRESH FROZEN PLASMA
Unit division: 0
Unit division: 0
Unit division: 0
Unit division: 0

## 2022-11-17 LAB — ECHOCARDIOGRAM COMPLETE
AR max vel: 1.73 cm2
AV Area VTI: 1.57 cm2
AV Area mean vel: 1.69 cm2
AV Mean grad: 2 mmHg
AV Peak grad: 3 mmHg
Ao pk vel: 0.87 m/s
Calc EF: 23.3 %
Height: 64 in
P 1/2 time: 338 msec
S' Lateral: 2.4 cm
Single Plane A2C EF: 24.5 %
Single Plane A4C EF: 26.3 %
Weight: 2726.65 oz

## 2022-11-17 LAB — CBC
HCT: 11.3 % — ABNORMAL LOW (ref 36.0–46.0)
Hemoglobin: 3.6 g/dL — CL (ref 12.0–15.0)
MCH: 25.2 pg — ABNORMAL LOW (ref 26.0–34.0)
MCHC: 31.9 g/dL (ref 30.0–36.0)
MCV: 79 fL — ABNORMAL LOW (ref 80.0–100.0)
Platelets: 20 10*3/uL — CL (ref 150–400)
RBC: 1.43 MIL/uL — ABNORMAL LOW (ref 3.87–5.11)
RDW: 17.5 % — ABNORMAL HIGH (ref 11.5–15.5)
WBC: 5.9 10*3/uL (ref 4.0–10.5)
nRBC: 32 % — ABNORMAL HIGH (ref 0.0–0.2)

## 2022-11-17 LAB — GLUCOSE, CAPILLARY
Glucose-Capillary: 177 mg/dL — ABNORMAL HIGH (ref 70–99)
Glucose-Capillary: 209 mg/dL — ABNORMAL HIGH (ref 70–99)
Glucose-Capillary: 221 mg/dL — ABNORMAL HIGH (ref 70–99)
Glucose-Capillary: 239 mg/dL — ABNORMAL HIGH (ref 70–99)

## 2022-11-17 LAB — BLOOD GAS, ARTERIAL
Acid-base deficit: 17.5 mmol/L — ABNORMAL HIGH (ref 0.0–2.0)
Acid-base deficit: 14.2 mmol/L — ABNORMAL HIGH (ref 0.0–2.0)
Bicarbonate: 8.4 mmol/L — ABNORMAL LOW (ref 20.0–28.0)
Bicarbonate: 11.1 mmol/L — ABNORMAL LOW (ref 20.0–28.0)
Drawn by: 59133
Drawn by: 29503
FIO2: 100 %
MECHVT: 440 mL
O2 Content: 4 L/min
O2 Saturation: 99.3 %
O2 Saturation: 100 %
PEEP: 5 cmH2O
Patient temperature: 37.4
Patient temperature: 37
RATE: 28 resp/min
pCO2 arterial: 21 mmHg — ABNORMAL LOW (ref 32–48)
pCO2 arterial: 23 mmHg — ABNORMAL LOW (ref 32–48)
pH, Arterial: 7.2 — ABNORMAL LOW (ref 7.35–7.45)
pH, Arterial: 7.29 — ABNORMAL LOW (ref 7.35–7.45)
pO2, Arterial: 112 mmHg — ABNORMAL HIGH (ref 83–108)
pO2, Arterial: 335 mmHg — ABNORMAL HIGH (ref 83–108)

## 2022-11-17 LAB — CBC WITH DIFFERENTIAL/PLATELET
Abs Immature Granulocytes: 0.52 10*3/uL — ABNORMAL HIGH (ref 0.00–0.07)
Basophils Absolute: 0.1 10*3/uL (ref 0.0–0.1)
Basophils Relative: 1 %
Eosinophils Absolute: 0 10*3/uL (ref 0.0–0.5)
Eosinophils Relative: 0 %
HCT: 23.7 % — ABNORMAL LOW (ref 36.0–46.0)
Hemoglobin: 7.6 g/dL — ABNORMAL LOW (ref 12.0–15.0)
Immature Granulocytes: 5 %
Lymphocytes Relative: 19 %
Lymphs Abs: 2.1 10*3/uL (ref 0.7–4.0)
MCH: 25.2 pg — ABNORMAL LOW (ref 26.0–34.0)
MCHC: 32.1 g/dL (ref 30.0–36.0)
MCV: 78.5 fL — ABNORMAL LOW (ref 80.0–100.0)
Monocytes Absolute: 1.2 10*3/uL — ABNORMAL HIGH (ref 0.1–1.0)
Monocytes Relative: 10 %
Neutro Abs: 7.4 10*3/uL (ref 1.7–7.7)
Neutrophils Relative %: 65 %
Platelets: 32 10*3/uL — ABNORMAL LOW (ref 150–400)
RBC: 3.02 MIL/uL — ABNORMAL LOW (ref 3.87–5.11)
RDW: 16.9 % — ABNORMAL HIGH (ref 11.5–15.5)
WBC: 11.3 10*3/uL — ABNORMAL HIGH (ref 4.0–10.5)
nRBC: 23.9 % — ABNORMAL HIGH (ref 0.0–0.2)

## 2022-11-17 LAB — BPAM RBC
Blood Product Expiration Date: 202406072359
Blood Product Expiration Date: 202406072359
ISSUE DATE / TIME: 202405130915
ISSUE DATE / TIME: 202405131014
ISSUE DATE / TIME: 202405131015
Unit Type and Rh: 6200
Unit Type and Rh: 6200
Unit Type and Rh: 6200
Unit Type and Rh: 6200

## 2022-11-17 LAB — PREPARE CRYOPRECIPITATE: Unit division: 0

## 2022-11-17 LAB — URINE CULTURE: Culture: NO GROWTH

## 2022-11-17 LAB — DIC (DISSEMINATED INTRAVASCULAR COAGULATION)PANEL
D-Dimer, Quant: 19.54 ug/mL-FEU — ABNORMAL HIGH (ref 0.00–0.50)
Fibrinogen: <60 mg/dL — CL (ref 210–475)
INR: >10 (ref 0.8–1.2)
Platelets: 19 10*3/uL — CL (ref 150–400)
Prothrombin Time: 84.3 seconds — ABNORMAL HIGH (ref 11.4–15.2)
aPTT: 86 seconds — ABNORMAL HIGH (ref 24–36)

## 2022-11-17 LAB — MASSIVE TRANSFUSION PROTOCOL ORDER (BLOOD BANK NOTIFICATION)

## 2022-11-17 LAB — PREPARE PLATELET PHERESIS
Unit division: 0
Unit division: 0

## 2022-11-17 LAB — LACTIC ACID, PLASMA: Lactic Acid, Venous: >9 mmol/L (ref 0.5–1.9)

## 2022-11-17 LAB — MAGNESIUM: Magnesium: 2.1 mg/dL (ref 1.7–2.4)

## 2022-11-17 LAB — AMMONIA: Ammonia: 136 umol/L — ABNORMAL HIGH (ref 9–35)

## 2022-11-17 LAB — PREPARE RBC (CROSSMATCH)

## 2022-11-17 MED ORDER — SODIUM CHLORIDE 0.9% IV SOLUTION: Freq: Once | INTRAVENOUS | Status: DC

## 2022-11-17 MED ORDER — ROCURONIUM BROMIDE 50 MG/5ML IV SOLN
80.0000 mg | Freq: Once | INTRAVENOUS | Status: AC
  Filled 2022-11-17: qty 8

## 2022-11-17 MED ORDER — ETOMIDATE 2 MG/ML IV SOLN: 10.0000 mg | Freq: Once | INTRAVENOUS | Status: AC

## 2022-11-17 MED ORDER — FENTANYL CITRATE (PF) 100 MCG/2ML IJ SOLN
INTRAMUSCULAR | Status: AC
  Filled 2022-11-17: qty 2

## 2022-11-17 MED ORDER — SODIUM CHLORIDE 0.9 % IV SOLN: 1.0000 g | INTRAVENOUS | Status: DC

## 2022-11-17 MED ORDER — VANCOMYCIN VARIABLE DOSE PER UNSTABLE RENAL FUNCTION (PHARMACIST DOSING): Status: DC

## 2022-11-17 MED ORDER — VASOPRESSIN 20 UNITS/100 ML INFUSION FOR SHOCK
0.0400 [IU]/min | INTRAVENOUS | Status: DC
  Administered 2022-11-17: 0.04 [IU]/min via INTRAVENOUS
  Filled 2022-11-17: qty 100

## 2022-11-17 MED ORDER — ETOMIDATE 2 MG/ML IV SOLN
INTRAVENOUS | Status: AC
  Administered 2022-11-17: 10 mg via INTRAVENOUS
  Filled 2022-11-17: qty 20

## 2022-11-17 MED ORDER — TRANEXAMIC ACID-NACL 1000-0.7 MG/100ML-% IV SOLN
1000.0000 mg | INTRAVENOUS | Status: DC
  Filled 2022-11-17: qty 100

## 2022-11-17 MED ORDER — STERILE WATER FOR INJECTION IV SOLN
INTRAVENOUS | Status: DC
  Filled 2022-11-17: qty 150

## 2022-11-17 MED ORDER — SODIUM BICARBONATE 8.4 % IV SOLN
INTRAVENOUS | Status: AC
  Administered 2022-11-17: 50 meq
  Filled 2022-11-17: qty 50

## 2022-11-17 MED ORDER — FENTANYL CITRATE PF 50 MCG/ML IJ SOSY
PREFILLED_SYRINGE | INTRAMUSCULAR | Status: AC
  Filled 2022-11-17: qty 2

## 2022-11-17 MED ORDER — SODIUM CHLORIDE 0.9% IV SOLUTION: Freq: Once | INTRAVENOUS | Status: AC

## 2022-11-17 MED ORDER — ROCURONIUM BROMIDE 10 MG/ML (PF) SYRINGE
PREFILLED_SYRINGE | INTRAVENOUS | Status: AC
  Administered 2022-11-17: 80 mg via INTRAVENOUS
  Filled 2022-11-17: qty 10

## 2022-11-17 MED ORDER — SODIUM BICARBONATE 8.4 % IV SOLN
INTRAVENOUS | Status: AC
  Filled 2022-11-17: qty 50

## 2022-11-17 MED ORDER — ORAL CARE MOUTH RINSE
15.0000 mL | OROMUCOSAL | Status: DC
  Administered 2022-11-17: 15 mL via OROMUCOSAL

## 2022-11-17 MED ORDER — ORAL CARE MOUTH RINSE: 15.0000 mL | OROMUCOSAL | Status: DC | PRN

## 2022-11-17 MED ORDER — MIDAZOLAM HCL 2 MG/2ML IJ SOLN
INTRAMUSCULAR | Status: AC
  Filled 2022-11-17: qty 2

## 2022-11-17 MED ORDER — METHYLPREDNISOLONE SODIUM SUCC 125 MG IJ SOLR
INTRAMUSCULAR | Status: AC
  Filled 2022-11-17: qty 2

## 2022-11-17 MED ORDER — CALCIUM GLUCONATE-NACL 1-0.675 GM/50ML-% IV SOLN
1.0000 g | Freq: Once | INTRAVENOUS | Status: AC
  Administered 2022-11-17: 1000 mg via INTRAVENOUS
  Filled 2022-11-17: qty 50

## 2022-11-17 MED FILL — Medication: Qty: 1 | Status: AC

## 2022-11-18 LAB — TYPE AND SCREEN
ABO/RH(D): A POS
Unit division: 0
Unit division: 0
Unit division: 0
Unit division: 0
Unit division: 0

## 2022-11-18 LAB — BPAM FFP
Blood Product Expiration Date: 202405132359
Blood Product Expiration Date: 202405182359
Blood Product Expiration Date: 202405182359
Blood Product Expiration Date: 202405182359
Blood Product Expiration Date: 202405182359
Blood Product Expiration Date: 202405182359
ISSUE DATE / TIME: 202405130856
ISSUE DATE / TIME: 202405131045
ISSUE DATE / TIME: 202405131045
ISSUE DATE / TIME: 202405131045
Unit Type and Rh: 600
Unit Type and Rh: 6200
Unit Type and Rh: 6200
Unit Type and Rh: 6200
Unit Type and Rh: 6200

## 2022-11-18 LAB — BPAM RBC
Blood Product Expiration Date: 202406032359
Blood Product Expiration Date: 202406052359
Blood Product Expiration Date: 202406062359
Blood Product Expiration Date: 202406062359
Blood Product Expiration Date: 202406072359
Blood Product Expiration Date: 202406072359
Blood Product Expiration Date: 202406072359
ISSUE DATE / TIME: 202405120356
ISSUE DATE / TIME: 202405131007
ISSUE DATE / TIME: 202405131015
Unit Type and Rh: 6200
Unit Type and Rh: 6200
Unit Type and Rh: 6200
Unit Type and Rh: 6200
Unit Type and Rh: 6200

## 2022-11-18 LAB — PREPARE FRESH FROZEN PLASMA
Unit division: 0
Unit division: 0
Unit division: 0
Unit division: 0
Unit division: 0
Unit division: 0
Unit division: 0

## 2022-11-18 LAB — BPAM PLATELET PHERESIS
Blood Product Expiration Date: 202405132359
Blood Product Expiration Date: 202405142359
Blood Product Expiration Date: 202405152359
Blood Product Expiration Date: 202405162359
ISSUE DATE / TIME: 202405131042
ISSUE DATE / TIME: 202405131048
ISSUE DATE / TIME: 202405140951
Unit Type and Rh: 600
Unit Type and Rh: 7300

## 2022-11-18 LAB — PREPARE PLATELET PHERESIS
Unit division: 0
Unit division: 0

## 2022-11-18 LAB — PREPARE CRYOPRECIPITATE: Unit division: 0

## 2022-11-18 LAB — BPAM CRYOPRECIPITATE
Blood Product Expiration Date: 202405131549
Unit Type and Rh: 6200

## 2022-11-18 LAB — CULTURE, BLOOD (ROUTINE X 2): Culture: NO GROWTH

## 2022-11-20 LAB — CULTURE, BLOOD (ROUTINE X 2)

## 2022-11-21 LAB — CULTURE, BLOOD (ROUTINE X 2): Culture: NO GROWTH

## 2022-11-26 ENCOUNTER — Other Ambulatory Visit (HOSPITAL_COMMUNITY): Payer: Medicare Other

## 2022-12-06 NOTE — Procedures (Signed)
Intubation Procedure Note  GEETHIKA GLEDHILL  [id number]  02/24/1946  Date:11/25/2022  Time:6:18 AM   Provider Performing:Denika Krone Marcos Eke    Procedure: Intubation (31500)  Indication(s) Respiratory Failure  Consent Unable to obtain consent due to emergent nature of procedure.   Anesthesia Etomidate and Rocuronium   Time Out Verified patient identification, verified procedure, site/side was marked, verified correct patient position, special equipment/implants available, medications/allergies/relevant history reviewed, required imaging and test results available.   Sterile Technique Usual hand hygeine, masks, and gloves were used   Procedure Description Patient positioned in bed supine.  Sedation given as noted above.  Patient was intubated with endotracheal tube using Glidescope.  View was Grade 1 full glottis .  Number of attempts was 1.  Colorimetric CO2 detector was consistent with tracheal placement. Anterior cords, slightly difficult with glidescope Complications/Tolerance None; patient tolerated the procedure well. Chest X-ray is ordered to verify placement.   EBL None    Specimen(s) None

## 2022-12-06 NOTE — Progress Notes (Signed)
   12/04/2022 0012  Provider Notification  Provider Name/Title E-link MD  Date Provider Notified 11/20/2022  Time Provider Notified 0015  Method of Notification Call  Notification Reason Change in status (low urine output)  Date Critical Result Received 11/28/2022  Provider response No new orders   Patient had 0 of urine output from 2300-0000, and only 105 ccs from 1900-2300. Writer callled elink to let them know.

## 2022-12-06 NOTE — Progress Notes (Addendum)
   11/10/2022 0400  Provider Notification  Provider Name/Title e-link md  Date Provider Notified 11/18/2022  Time Provider Notified 0400  Method of Notification Call  Notification Reason Change in status (change in mentation and rr in the 40s)  Provider response See new orders  Date of Provider Response 11/09/2022  Time of Provider Response [phone]   Writer letting know that the patient has quickly dropped her gcs from 8 to 3. Patient is no longer opening her eyes or responding to painful stimuli.

## 2022-12-06 NOTE — Telephone Encounter (Signed)
Post ED Visit - Positive Culture Follow-up  Culture report reviewed by antimicrobial stewardship pharmacist: [facility] []  Enzo Bi, Pharm.D. []  Celedonio Miyamoto, Pharm.D., BCPS AQ-ID []  Garvin Fila, Pharm.D., BCPS []  Georgina Pillion, Pharm.D., BCPS []  [place], [place].D., BCPS, AAHIVP []  Estella Husk, Pharm.D., BCPS, AAHIVP []  Lysle Pearl, PharmD, BCPS []  Phillips Climes, PharmD, BCPS []  Agapito Games, PharmD, BCPS []  Verlan Friends, PharmD []  Mervyn Gay, PharmD, BCPS []  Vinnie Level, PharmD  [facility] []  Len Childs, PharmD []  Greer Pickerel, PharmD []  Adalberto Cole, PharmD []  Perlie Gold, Rph []  Lonell Face) Jean Rosenthal, PharmD []  Earl Many, PharmD []  Junita Push, PharmD []  Dorna Leitz, PharmD []  Terrilee Files, PharmD []  Lynann Beaver, PharmD []  Keturah Barre, PharmD []  [name], PharmD [x]  Georgina Pillion, PharmD   Positive urine culture Treated with Cefpodoxime Proxetil and currently admitted to [facility] and getting additonal workup  Patsey Berthold 11/18/2022, 9:22 AM

## 2022-12-06 NOTE — Significant Event (Signed)
PCCM INTERVAL PROGRESS NOTE  Called to bedside to evaluate patient in the setting of acute decompensation. During the process of MTP the patient became rapidly hypotensive. Calcium, bicarb, solumedrol given. Declined to cardiac arrest PEA. ACLS initiated including CPR, epinephrine, sodium bicarb administration. See code documentation for more accurate details. ROSC after 2 rounds CPR. At this point there was significant hemorrhage into ETT. Pupils were noted to be dilated and fixed. Family arrived at bedside. Patient once again became significantly hypotensive and was able to be temporarily resuscitated with push dose epinephrine. With repeat cardiac arrest impending, family decided to pursue DNR/comfort care.   Joneen Roach, AGACNP-BC [facility]  See Amion for personal pager PCCM on call pager [phone] until 7pm. Please call Elink 7p-7a. [phone]  12/03/2022 11:29 AM

## 2022-12-06 NOTE — Progress Notes (Signed)
NAME:  Miranda Roman, MRN:  [id number], DOB:  02/18/1946, LOS: 1 ADMISSION DATE:  11/18/2022, CONSULTATION DATE:  11/27/2022  REFERRING MD:  Preston Fleeting, EDP , CHIEF COMPLAINT:  lactic acidosis   History of Present Illness:  76 year old diabetic presented to ED 5/10 with nausea vomiting, bilateral lower back pain and generalized abdominal pain Showed mild leukocytosis, lactate 2.7, lipase 62, UA showed many bacteria with moderate leukocytes, BUN/creatinine was 47/3.0.  She was given ceftriaxone for UTI.  CT abdomen/pelvis without contrast showed bilateral nonobstructive nephrolithiasis and sigmoid diverticulosis without inflammation.  8 mm right middle lobe nodule was noted.  She was discharged on oral cefpodoxime.  She was noted to be tolerating oral. At home her oral intake decreased over the last 24 hours and she became more lethargic and was brought back to the emergency room Labs showed lactic acid more than 9 , elevated LFTs, BUN/creatinine 54/4.1 with potassium more than 7.5 and increasing leukocytosis.  Urine culture showing Klebsiella .she was hypothermic, hypotensive  Pertinent  Medical History  Diabetes type 2, insulin requiring Hyperlipidemia CKD stage IV Pulm hypertension Beta thalassemia Gastric bypass   Significant Hospital Events: Including procedures, antibiotic start and stop dates in addition to other pertinent events   5/12 admitted to ICU, septic shock, AKI, right fem vas-cath placed, right fem arterial line placed, unable to start CRRT due to line issues 5/13 patient intubated  Interim History / Subjective:   Unable to initiate CRRT yesterday due to issues with right fem vas-cath. CRRT was tried with multiple machines without success. Multiple positional changes made without success.   Patient intubated early this morning due to worsening mental status and shock.   Patient with bloody secretions from OG tube and bloody bowel movements  Objective   Blood pressure  (!) 129/58, pulse (!) 130, temperature 98.8 F (37.1 C), temperature source Axillary, resp. rate (!) 25, height 5\' 4"  (1.626 m), weight 77.3 kg, SpO2 100 %.    Vent Mode: PRVC FiO2 (%):  [100 %] 100 % Set Rate:  [28 bmp] 28 bmp Vt Set:  [440 mL] 440 mL PEEP:  [5 cmH20] 5 cmH20 Plateau Pressure:  [17 cmH20] 17 cmH20   Intake/Output Summary (Last 24 hours) at 11/14/2022 0742 Last data filed at 11/07/2022 1478 Gross per 24 hour  Intake 3622.58 ml  Output 296 ml  Net 3326.58 ml   Filed Weights   11/30/2022 2332 11/15/2022 0500  Weight: 76.2 kg 77.3 kg   Examination: General: acutely ill elderly woman, intubated, sedated HENT: [place]/AT, ET tube in place, sclera anicteric Lungs: course breath sounds bilaterally Cardiovascular: S1-S2 regular, no murmur  Abdomen: soft, mildly distended, hypoactive bowel sounds Extremities: warm, no edema Neuro: Follows one-step commands, lethargic, moves all 4 extremities, no meningeal signs GU: No urine  Cr 4.79 Bicarb 7 AG 30 AST >10,000 ALT 13,586 Ammonia 136 T. Bili 2.5 H/H 7.6/23.7 Plts 19 Fibrinogen <60 INR >10 Ddimer 19.54   Resolved Hospital Problem list   Hyperkalemia  Assessment & Plan:  Septic Shock - In setting of Klebsiella UTI - Follow up blood cultures - continue meropenem and vancomycin - continue levophed and vasopressin for MAP goal 65 or greater  Acute Hypoxemic respiratory Failure - Patient intubated early this morning - continue mechanical ventilatory support - Maintain plateau pressures less than 30cmH2O, 6-8cc/Kg tidal volume - ABGs as needed  Acute DIC Hemolytic Anemia Coagulopathy - in setting of sepsis - DIC panel consistent with acute DIC  - Give 2  units FFP, 2 units cryoprecipitate, 1 units platelets and 1 units PRBCs  AKI on CKDIV Urinary Tract Infection - Klebsiella Severe Metabolic Acidosis Lactic Acidosis - Antibiotics as above for UTI and sepsis - Continue bicarb drip until CRRT initiated -  Will place right IJ HD cath given issues with right fem HD cath - Start CRRT once new line in place  Elevated Liver Enzymes - in setting of septic shock and DIC - continue to monitor  Acute Metabolic Encephalopathy - continue to monitor - Elevated ammonia level, stop lactulose for now, will resume if remains elevated after CRRT started   Best Practice (right click and "Reselect all SmartList Selections" daily)   Diet/type: NPO DVT prophylaxis: SCD GI prophylaxis: PPI Lines: Central line and Dialysis Catheter Foley:  Yes, and it is still needed Code Status:  full code Last date of multidisciplinary goals of care discussion [updated son Caryn Bee at bedside 5/13]  Labs   CBC: Recent Labs  Lab 11/16/22 0129 11/16/22 1003 11/16/22 1446 11/16/22 1604 11/22/2022 0348  WBC 18.9* 16.1* 14.7* 13.7* 11.3*  NEUTROABS 14.2* 12.9* 11.6*  --  7.4  HGB 6.6* 9.1* 8.7* 8.2* 7.6*  HCT 22.3* 28.9* 27.3* 25.6* 23.7*  MCV 80.8 79.4* 77.1* 77.1* 78.5*  PLT 35* 42*  44* 39* 35* 32*    Basic Metabolic Panel: Recent Labs  Lab 11/16/22 0631 11/16/22 1003 11/16/22 1718 11/16/22 2055 11/10/2022 0348  NA 141 136 142 142 140  140  K >7.5* 6.2* 4.5 4.4 4.1  4.1  CL 107 110 107 107 103  102  CO2 <7* 8* 11* 11* 7*  7*  GLUCOSE 243* 268* 265* 247* 198*  198*  BUN 49* 55* 53* 53* 54*  55*  CREATININE 4.22* 4.19* 4.06* 4.31* 4.79*  4.76*  CALCIUM 8.0* 7.4* 6.8* 6.5* 6.6*  6.5*  MG  --   --   --   --  2.1  PHOS  --  12.5* 10.2*  --  9.9*   GFR: Estimated Creatinine Clearance: 10.3 mL/min (A) (by C-G formula based on SCr of 4.76 mg/dL (H)). Recent Labs  Lab 11/16/22 0410 11/16/22 0801 11/16/22 1003 11/16/22 1005 11/16/22 1446 11/16/22 1556 11/16/22 1604 11/08/2022 0348  PROCALCITON  --  12.71  --   --   --   --   --   --   WBC  --   --  16.1*  --  14.7*  --  13.7* 11.3*  LATICACIDVEN >9.0*  --   --  >9.0*  --  >9.0*  --  >9.0*    Liver Function Tests: Recent Labs  Lab  11/14/22 1112 11/16/22 0125 11/16/22 0631 11/16/22 1003 11/16/22 1718 11/05/2022 0348  AST 23 3,313* 6,648*  --   --  >10,000*  ALT 39 3,613* 7,528*  --   --  13,586*  ALKPHOS 69 172* 183*  --   --  448*  BILITOT 0.6 1.8* 2.2*  --   --  2.5*  PROT 7.1 5.9* 5.5*  --   --  5.2*  ALBUMIN 3.8 2.7* 2.5* 2.5* 2.3* 2.2*  2.2*   Recent Labs  Lab 11/14/22 1112  LIPASE 62*   Recent Labs  Lab 11/16/22 2122 11/16/2022 0348  AMMONIA 126* 136*    ABG    Component Value Date/Time   PHART 7.2 (L) 11/14/2022 0347   PCO2ART 21 (L) 11/11/2022 0347   PO2ART 112 (H) 11/20/2022 0347   HCO3 8.4 (L) 12/01/2022 0347   TCO2  22 11/14/2022 1124   ACIDBASEDEF 17.5 (H) 11/05/2022 0347   O2SAT 99.3 11/28/2022 0347     Coagulation Profile: Recent Labs  Lab 11/16/22 1003  INR 3.1*    Cardiac Enzymes: No results for input(s): "CKTOTAL", "CKMB", "CKMBINDEX", "TROPONINI" in the last 168 hours.  HbA1C: Hemoglobin A1C  Date/Time Value Ref Range Status  09/03/2020 12:00 AM 5.2  Final   Hgb A1c MFr Bld  Date/Time Value Ref Range Status  06/10/2022 04:58 PM 5.9 (H) 4.8 - 5.6 % Final    Comment:             Prediabetes: 5.7 - 6.4          Diabetes: >6.4          Glycemic control for adults with diabetes: <7.0   01/29/2022 11:39 AM 5.5 4.8 - 5.6 % Final    Comment:             Prediabetes: 5.7 - 6.4          Diabetes: >6.4          Glycemic control for adults with diabetes: <7.0     CBG: Recent Labs  Lab 11/16/22 1156 11/16/22 1612 11/16/22 1945 11/16/22 2357 12/03/2022 0325  GLUCAP 255* 223* 239* 209* 177*     Critical care time: 80 mins    Melody Comas, MD [facility] Office: [phone]   See Amion for personal pager PCCM on call pager [phone] until 7pm. Please call Elink 7p-7a. [phone]

## 2022-12-06 NOTE — Progress Notes (Addendum)
   11/16/22 2150  Provider Notification  Provider Name/Title [email or web address]-link MD  Date Provider Notified 11/16/22  Time Provider Notified 2150  Method of Notification Call  Notification Reason Critical Result (amonia 123, not critical just high)  Test performed and critical result Ammonia 123  Date Critical Result Received 11/16/22  Time Critical Result Received 2150  Provider response See new orders  Date of Provider Response 11/16/22  Time of Provider Response 2200   Writer called e-link earlier about suspicion that ammonia levels might be high due to elevated liver enzymes and decreased mentation. Ammonia level obtained MD notified about elevated ammonia levels, orders for NG placement and lactulose administration. Also mentioned to elink  RN that patients bicarb level was still low at 11.3 even with bicarb gtt. Writer suggested a push of bicarb or an increase in gtt. Was told by RN that we should wait till morning labs to see about adjustments.

## 2022-12-06 NOTE — Death Summary Note (Signed)
DEATH SUMMARY   Patient Details  Name: Miranda Roman MRN: [id number] DOB: Jan 30, 1946  Admission/Discharge Information   Admit Date:  30-Nov-2022  Date of Death: Date of Death: 12-02-2022  Time of Death: Time of Death: 12-09-28  Length of Stay: 1  Referring Physician: Annita Brod, MD   Reason(s) for Hospitalization  Sepsis due to urinary tract infection  Diagnoses  Preliminary cause of death:  Sepsis due to urinary tract infection  Secondary Diagnoses (including complications and co-morbidities):  Principal Problem:   Shock (HCC) Sepsis due to Urinary Tract Infection Acute Hypoxemic Respiratory Failure Acute Disseminated Intravascular Coagulapathy  Hemolytic Anemia Acute Kidney Injury Chronic Kidney Disease Stage IV Severe Metabolic and Lactic Acidosis Acute Metabolic Encephalopathy Elevated Liver Enzymes Diabetes Mellitus Type II  Brief Hospital Course (including significant findings, care, treatment, and services provided and events leading to death)  Miranda Roman is a 77 y.o. year old female with history of DMII who was admitted 11/16/22 for lethargy and altered mental status who was treated for urinary tract infection after presenting to the ER on 11/14/22. She was treated with IV ceftriaxone and then sent home with oral cefpodoxime.  Labs indicated acute kidney injury, early DIC, lactic acidosis and elevated LFTs. Urine culture from 5/10 grew klebsiella pneumoniae. She was started on meropenem and vancomycin. She developed shock and was started on levophed. An arterial catheter and vas-cath were placed in the right femoral artery/vein respectively. She was started on CRRT but unsuccessful even after trouble shooting different machines and patient positions. She was started on bicarbonate drip due to severe metabolic acidosis. She was intubated early morning on 12/02/22. She had progressive DIC and massive transfusion protocol was initiated. Plans to place right IJ vas cath in  order to start CRRT. Patient continued to have coagulopathy and bleeding issues despite transfusions. She had progressive hypotension which led to PEA cardiac arrest. ACLS was performed with 2 rounds of CPR with return of spontaneous circulation. Patient's family arrived, son and daughter, arrived at the bedside. Patient had developed frank blood in her ET tube. On exam, patient's pupils were fixed and dilated. Patient's prognosis was discussed with the family and decision to transition to comfort care. Patient passed away at 1130 on 02-Dec-2022.   Pertinent Labs and Studies  Significant Diagnostic Studies ECHOCARDIOGRAM COMPLETE  Result Date: 12-02-22    ECHOCARDIOGRAM REPORT   Patient Name:   Miranda Roman Date of Exam: 2022-12-02 Medical Rec #:  [id number]        Height:       64.0 in Accession #:    [id number]       Weight:       170.4 lb Date of Birth:  1945/07/27       BSA:          1.828 m Patient Age:    77 years         BP:           129/58 mmHg Patient Gender: F                HR:           107 bpm. Exam Location:  Inpatient Procedure: 2D Echo, Color Doppler and Cardiac Doppler Indications:    Shock  History:        Patient has prior history of Echocardiogram examinations, most                 recent 12/22/2019. CAD, Aortic Valve Disease;  Risk                 Factors:Diabetes. Sickle Cell, CKD.  Sonographer:    Milbert Coulter Referring Phys: [id number] RAKESH V ALVA IMPRESSIONS  1. Left ventricular ejection fraction, by estimation, is 25 to 30%. The left ventricle has severely decreased function. The left ventricle demonstrates global hypokinesis. There is mild left ventricular hypertrophy. Left ventricular diastolic parameters  are indeterminate.  2. Right ventricular systolic function is severely reduced. The right ventricular size is moderately enlarged.  3. The mitral valve is normal in structure. Mild mitral valve regurgitation. No evidence of mitral stenosis.  4. The aortic valve is tricuspid. Aortic  valve regurgitation is mild. Aortic valve sclerosis is present, with no evidence of aortic valve stenosis.  5. The inferior vena cava is normal in size with greater than 50% respiratory variability, suggesting right atrial pressure of 3 mmHg. Comparison(s): No prior Echocardiogram. FINDINGS  Left Ventricle: Left ventricular ejection fraction, by estimation, is 25 to 30%. The left ventricle has severely decreased function. The left ventricle demonstrates global hypokinesis. The left ventricular internal cavity size was normal in size. There is mild left ventricular hypertrophy. Left ventricular diastolic parameters are indeterminate. Right Ventricle: The right ventricular size is moderately enlarged. Right ventricular systolic function is severely reduced. Left Atrium: Left atrial size was normal in size. Right Atrium: Right atrial size was normal in size. Pericardium: There is no evidence of pericardial effusion. Mitral Valve: The mitral valve is normal in structure. Mild mitral valve regurgitation. No evidence of mitral valve stenosis. Tricuspid Valve: The tricuspid valve is normal in structure. Tricuspid valve regurgitation is mild . No evidence of tricuspid stenosis. Aortic Valve: The aortic valve is tricuspid. Aortic valve regurgitation is mild. Aortic regurgitation PHT measures 338 msec. Aortic valve sclerosis is present, with no evidence of aortic valve stenosis. Aortic valve mean gradient measures 2.0 mmHg. Aortic valve peak gradient measures 3.0 mmHg. Aortic valve area, by VTI measures 1.57 cm. Pulmonic Valve: The pulmonic valve was normal in structure. Pulmonic valve regurgitation is trivial. No evidence of pulmonic stenosis. Aorta: The aortic root is normal in size and structure. Venous: The inferior vena cava is normal in size with greater than 50% respiratory variability, suggesting right atrial pressure of 3 mmHg. IAS/Shunts: No atrial level shunt detected by color flow Doppler.  LEFT VENTRICLE PLAX 2D  LVIDd:         3.10 cm     Diastology LVIDs:         2.40 cm     LV e' medial:  4.90 cm/s LV PW:         1.30 cm     LV e' lateral: 4.79 cm/s LV IVS:        1.30 cm LVOT diam:     1.70 cm LV SV:         22 LV SV Index:   12 LVOT Area:     2.27 cm  LV Volumes (MOD) LV vol d, MOD A2C: 38.3 ml LV vol d, MOD A4C: 46.4 ml LV vol s, MOD A2C: 28.9 ml LV vol s, MOD A4C: 34.2 ml LV SV MOD A2C:     9.4 ml LV SV MOD A4C:     46.4 ml LV SV MOD BP:      9.8 ml RIGHT VENTRICLE RV Basal diam:  2.80 cm RV Mid diam:    3.30 cm RV S prime:     5.44 cm/s TAPSE (M-mode):  1.3 cm LEFT ATRIUM             Index        RIGHT ATRIUM           Index LA diam:        3.10 cm 1.70 cm/m   RA Area:     11.80 cm LA Vol (A2C):   32.1 ml 17.56 ml/m  RA Volume:   23.50 ml  12.86 ml/m LA Vol (A4C):   38.9 ml 21.28 ml/m LA Biplane Vol: 37.3 ml 20.41 ml/m  AORTIC VALVE AV Area (Vmax):    1.73 cm AV Area (Vmean):   1.69 cm AV Area (VTI):     1.57 cm AV Vmax:           86.90 cm/s AV Vmean:          59.900 cm/s AV VTI:            0.137 m AV Peak Grad:      3.0 mmHg AV Mean Grad:      2.0 mmHg LVOT Vmax:         66.40 cm/s LVOT Vmean:        44.600 cm/s LVOT VTI:          0.095 m LVOT/AV VTI ratio: 0.69 AI PHT:            338 msec  AORTA Ao Root diam: 2.80 cm Ao Asc diam:  3.40 cm TRICUSPID VALVE TR Peak grad:   33.2 mmHg TR Vmax:        288.00 cm/s  SHUNTS Systemic VTI:  0.09 m Systemic Diam: 1.70 cm Olga Millers MD Electronically signed by Olga Millers MD Signature Date/Time: 11/05/2022/12:31:26 PM    Final    DG CHEST PORT 1 VIEW  Result Date: 11/07/2022 CLINICAL DATA:  Intubation of airway EXAM: PORTABLE CHEST 1 VIEW COMPARISON:  11/16/2022 FINDINGS: Endotracheal tube with tip between the clavicular heads and carina. The enteric tube reaches the stomach. Mild hazy density and volume loss at the right base. No edema, effusion, or pneumothorax. IMPRESSION: 1. Unremarkable hardware. 2. Low volume chest with mild atelectasis at the right  base. Electronically Signed   By: Tiburcio Pea M.D.   On: 11/12/2022 06:37   DG Abd 1 View  Result Date: 11/16/2022 CLINICAL DATA:  Check gastric catheter placement EXAM: ABDOMEN - 1 VIEW COMPARISON:  None FINDINGS: Gastric catheter is noted within the stomach. IMPRESSION: Gastric catheter in the stomach. Electronically Signed   By: Alcide Clever M.D.   On: 11/16/2022 22:48   US RENAL  Result Date: 11/16/2022 CLINICAL DATA:  [id number] AKI (acute kidney injury) (HCC) [id number] EXAM: RENAL / URINARY TRACT ULTRASOUND COMPLETE COMPARISON:  CT 11/14/2022 FINDINGS: Right Kidney: Renal measurements: 12.1 x 6.0 x 6.1 cm = volume: 230.4 mL. No hydronephrosis. Increased renal cortical echogenicity. There is a 2.1 cm simple cyst which requires no follow-up imaging. Left Kidney: Renal measurements: 11.4 x 6.6 x 4.5 cm = volume: 177.1 mL. No hydronephrosis. Increased renal cortical echogenicity. Hypoechoic area measured in the region of renal pelvis without definite correlate on recent CT. This is favored to be artifactual. Bladder: Decompressed with Foley catheter in place. Other: None. IMPRESSION: No hydronephrosis. Increased renal cortical echogenicity bilaterally, as can be seen in medical renal disease. Electronically Signed   By: Caprice Renshaw M.D.   On: 11/16/2022 16:06   DG Chest Port 1 View  Result Date: 11/16/2022 CLINICAL DATA:  Lethargic and altered  mental status. EXAM: PORTABLE CHEST 1 VIEW COMPARISON:  June 01, 2022 FINDINGS: The heart size and mediastinal contours are within normal limits. Low lung volumes are noted. Both lungs are clear. The visualized skeletal structures are unremarkable. IMPRESSION: No active disease. Electronically Signed   By: Aram Candela M.D.   On: 11/16/2022 01:05   CT ABDOMEN PELVIS WO CONTRAST  Result Date: 11/14/2022 CLINICAL DATA:  Acute generalized abdominal pain. EXAM: CT ABDOMEN AND PELVIS WITHOUT CONTRAST TECHNIQUE: Multidetector CT imaging of the abdomen and  pelvis was performed following the standard protocol without IV contrast. RADIATION DOSE REDUCTION: This exam was performed according to the departmental dose-optimization program which includes automated exposure control, adjustment of the mA and/or kV according to patient size and/or use of iterative reconstruction technique. COMPARISON:  May 16, 2022.  April 25, 2020. FINDINGS: Lower chest: Minimal bibasilar subsegmental atelectasis. 8 mm nodule is noted in right middle lobe which is unchanged compared to prior exam of 2023 and slightly enlarged compared to prior exam of 2021. Hepatobiliary: Minimal cholelithiasis. No biliary dilatation is noted. Liver is unremarkable. Pancreas: Stable 9 mm cyst seen in pancreatic tail. No pancreatic ductal dilatation or surrounding inflammatory changes. Spleen: Normal in size without focal abnormality. Adrenals/Urinary Tract: Adrenal glands appear normal. Bilateral nonobstructive nephrolithiasis is noted. Stable right renal cyst is noted for which no further follow-up is required. No hydronephrosis or renal obstruction is noted. Urinary bladder is unremarkable. Stomach/Bowel: Status post gastric bypass. There is no evidence of bowel obstruction or inflammation. Sigmoid diverticulosis is noted without inflammation. The appendix is unremarkable. Vascular/Lymphatic: Aortic atherosclerosis. No enlarged abdominal or pelvic lymph nodes. Reproductive: Uterus and bilateral adnexa are unremarkable. Other: No abdominal wall hernia or abnormality. No abdominopelvic ascites. 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 No acute osseous abnormality is noted. IMPRESSION: 8 mm right middle lobe nodule is noted which is slightly enlarged compared to prior exam of 2021. Follow-up CT scan in 12 months is recommended to ensure stability. Stable 9  mm cyst seen in pancreatic tail. Minimal cholelithiasis. Bilateral nonobstructive nephrolithiasis. Status post gastric bypass. Sigmoid diverticulosis without inflammation. 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 Aortic Atherosclerosis (ICD10-I70.0). Electronically Signed   By: Lupita Raider M.D.   On: 11/14/2022 12:21    Microbiology Recent Results (from the past 240 hour(s))  Urine Culture     Status: Abnormal   Collection Time: 11/14/22 10:58 AM   Specimen: Urine, Random  Result Value Ref Range Status   Specimen Description   Final    URINE, RANDOM Performed at [facility], [address], [place], [place] [address]    Special Requests   Final    NONE Reflexed from [id number] Performed at [facility], [address] [place], [place] [address]    Culture >=100,000 COLONIES/mL KLEBSIELLA PNEUMONIAE (A)  Final   Report Status 11/16/2022 FINAL  Final   Organism ID, Bacteria KLEBSIELLA PNEUMONIAE (A)  Final      Susceptibility   Klebsiella pneumoniae - MIC*    AMPICILLIN RESISTANT Resistant     CEFAZOLIN <=4 SENSITIVE Sensitive     CEFEPIME <=0.12 SENSITIVE Sensitive     CEFTRIAXONE <=0.25 SENSITIVE Sensitive     CIPROFLOXACIN <=0.25 SENSITIVE Sensitive     GENTAMICIN <=1 SENSITIVE Sensitive  IMIPENEM <=0.25 SENSITIVE Sensitive     NITROFURANTOIN 64 INTERMEDIATE Intermediate     TRIMETH/SULFA <=20 SENSITIVE Sensitive     AMPICILLIN/SULBACTAM 4 SENSITIVE Sensitive     PIP/TAZO <=4 SENSITIVE Sensitive     * >=100,000 COLONIES/mL KLEBSIELLA PNEUMONIAE  Culture, blood (Routine x 2)     Status: None (Preliminary result)   Collection Time: 12/03/2022 11:45 PM   Specimen: BLOOD  Result Value Ref Range Status   Specimen Description   Final    BLOOD LEFT ANTECUBITAL Performed at [facility], [address],  [place], [place] [address]    Special Requests   Final    BOTTLES DRAWN AEROBIC AND ANAEROBIC Blood Culture results may not be optimal due to an inadequate volume of blood received in culture bottles Performed at [facility], [address] [place], [place] [address]    Culture   Final    NO GROWTH 1 DAY Performed at [facility], [address] [place], [place] [address]    Report Status PENDING  Incomplete  Culture, blood (Routine x 2)     Status: None (Preliminary result)   Collection Time: 11/24/2022 11:45 PM   Specimen: BLOOD  Result Value Ref Range Status   Specimen Description   Final    BLOOD LEFT ANTECUBITAL Performed at [facility], [address] [place], [place] [address]    Special Requests   Final    BOTTLES DRAWN AEROBIC AND ANAEROBIC Blood Culture results may not be optimal due to an inadequate volume of blood received in culture bottles Performed at [facility], [address] [place], [place] [address]    Culture   Final    NO GROWTH 1 DAY Performed at [facility], [address], [place], [place] [address]    Report Status PENDING  Incomplete  Urine Culture     Status: None   Collection Time: 11/16/22 12:28 AM   Specimen: Urine, Random  Result Value Ref Range Status   Specimen Description   Final    URINE, RANDOM Performed at [facility], [address], [place], [place] [address]    Special Requests   Final    NONE Reflexed from [id number] Performed at [facility], [address], [place], [place] [address]    Culture   Final    NO GROWTH Performed at [facility], [address], [place], [place] [address]    Report Status 11/28/2022 FINAL  Final  MRSA Next Gen by PCR, Nasal     Status: None   Collection Time: 11/16/22  9:21 AM   Specimen: Nasal Mucosa; Nasal Swab  Result Value Ref Range Status   MRSA by PCR Next Gen NOT DETECTED NOT  DETECTED Final    Comment: (NOTE) The GeneXpert MRSA Assay (FDA approved for NASAL specimens only), is one component of a comprehensive MRSA colonization surveillance program. It is not intended to diagnose MRSA infection nor to guide or monitor treatment for MRSA infections. Test performance is not FDA approved in patients less than 52 years old. Performed at [facility] , [address] [place], [place] [address]     Lab Basic Metabolic Panel: Recent Labs  Lab 11/16/22 0631 11/16/22 1003 11/16/22 1718 11/16/22 2055 11/08/2022 0348  NA 141 136 142 142 140  140  K >7.5* 6.2* 4.5 4.4 4.1  4.1  CL 107 110 107 107 103  102  CO2 <7* 8* 11* 11* 7*  7*  GLUCOSE 243* 268* 265* 247* 198*  198*  BUN 49* 55* 53* 53* 54*  55*  CREATININE 4.22* 4.19* 4.06* 4.31* 4.79*  4.76*  CALCIUM 8.0* 7.4* 6.8* 6.5* 6.6*  6.5*  MG  --   --   --   --  2.1  PHOS  --  12.5* 10.2*  --  9.9*   Liver Function Tests: Recent Labs  Lab 11/14/22 1112 11/16/22 0125 11/16/22 0631 11/16/22 1003 11/16/22 1718 12/02/2022 0348  AST 23 3,313* 6,648*  --   --  >10,000*  ALT 39 3,613* 7,528*  --   --  13,586*  ALKPHOS 69 172* 183*  --   --  448*  BILITOT 0.6 1.8* 2.2*  --   --  2.5*  PROT 7.1 5.9* 5.5*  --   --  5.2*  ALBUMIN 3.8 2.7* 2.5* 2.5* 2.3* 2.2*  2.2*   Recent Labs  Lab 11/14/22 1112  LIPASE 62*   Recent Labs  Lab 11/16/22 2122 12/03/2022 0348  AMMONIA 126* 136*   CBC: Recent Labs  Lab 11/16/22 0129 11/16/22 1003 11/16/22 1446 11/16/22 1604 11/12/2022 0348 11/19/2022 0813  WBC 18.9* 16.1* 14.7* 13.7* 11.3* 5.9  NEUTROABS 14.2* 12.9* 11.6*  --  7.4  --   HGB 6.6* 9.1* 8.7* 8.2* 7.6* 3.6*  HCT 22.3* 28.9* 27.3* 25.6* 23.7* 11.3*  MCV 80.8 79.4* 77.1* 77.1* 78.5* 79.0*  PLT 35* 42*  44* 39* 35* 32* 20*  19*   Cardiac Enzymes: No results for input(s): "CKTOTAL", "CKMB", "CKMBINDEX", "TROPONINI" in the last 168 hours. Sepsis Labs: Recent Labs  Lab  11/16/22 0410 11/16/22 0801 11/16/22 1003 11/16/22 1005 11/16/22 1446 11/16/22 1556 11/16/22 1604 12/01/2022 0348 11/19/2022 0813  PROCALCITON  --  12.71  --   --   --   --   --   --   --   WBC  --   --    < >  --  14.7*  --  13.7* 11.3* 5.9  LATICACIDVEN >9.0*  --   --  >9.0*  --  >9.0*  --  >9.0*  --    < > = values in this interval not displayed.    Procedures/Operations  Endotracheal intubation Arterial Catheter Placement Central Line Placement Cardiopulmonary resuscitation   Martina Sinner 11/11/2022, 5:39 PM

## 2022-12-06 NOTE — Progress Notes (Signed)
Chaplain was present with Miranda Roman's son and daughter through the code and the period of time following.  Chaplain assessed for spiritual needs and family requested prayer.  In addition to prayer, chaplain provided emotional support and gave them privacy to spend time with their mom.  [address], Bcc Pager, [phone]

## 2022-12-06 NOTE — Progress Notes (Signed)
[facility] Progress Note  Subjective: pt seen in ICU, getting rapid transfusion of prbc's due to acute GIB and steep drop in Hb this am. Also had PEA arrest this am. CRRT was unable to be started overnight due to line issues. The high K+ levels however corrected regardless, perhaps due to IV bicarb gtt that was given.   Vitals:   12/02/2022 0922 11/16/2022 0937 11/11/2022 1000 11/14/2022 1002  BP:    108/61  Pulse:      Resp: (!) 28 (!) 28 (!) 28 (!) 28  Temp: 98.1 F (36.7 C) 97.9 F (36.6 C) 98.5 F (36.9 C) 98.5 F (36.9 C)  TempSrc: Axillary Oral Oral Oral  SpO2:   100%   Weight:      Height:        Exam: Gen on vent, sedated +livedo pattern on LE's, cool extremities Sclera anicteric, throat w/ ETT No jvd or bruits Chest clear anterior/ lateral,  RRR no MRG Abd soft ntnd no mass or ascites +bs GU foley w/ small amts clear yellow urine MS no joint effusions or deformity Ext 1+ bilat mild dependent hip edema Neuro is on vent, sedated         Date   Creat  eGFR    2011   1.35  40- 73ml/min, CKD 3a    2019   2.40- 2.70    2020   2.24- 3.03    2021   1.52- 2.36    2022   2.30- 2.31    Feb 2023  2.59      Jul     2.43  20 ml/min    Sept   2.60  19 ml/min    Nov-dec 2023 2.36- 3.57 13- 21 ml/min     11/14/22  2.66  18 ml/min       11/16/22  4.06- 4.31    11/23/2022  4.76      B/l creat 2.3- 2.6 from nov '23- may '24, eGFR 18-109ml/min.      UA- mod hb, prot > 300, no bact, 21-50 rbc/ wbc     Renal US - 12.1/ 11.4 cm kidneys w/o hydro            Todays - Na 140  K 4.1 CO2 7 BUN 54  creat 4.7     AST >10,000 / ALT 13,568, alb 2.2,  AG 30 tbili 2.5, NH3 136     WBC 5K  Hb 3.6 (was 7.6 yest) plts 19k  (down from 32k)     INR > 10, PTT 86, d-dimer 19.5, fibrinogen < 60     CXR 5/13 - mild R basilar atx, o/w no acute     I/O +10.2L and neg 796cc = + 9.4 L net   Assessment/ Plan: AKI on CKD 4 - b/l creat 2.3- 2.6 from nov '23- may '24, eGFR 18-74ml/min. Creat  here was 2.6 on admission in setting of shock, likely sepsis/ UTI and needing pressor support. Creat worsening up to 4.3 w/ very high K+ levels, so plan was to start CRRT yesterday, however, the line / machine would not work despite multiple attempts. K+ today has corrected. Plan per RN is for new catheter placement then will try again w/ CRRT.  Shock/ sepsis - +UCx growing Klebsiella, on broad-spec IV abx Acute DIC/ coagulopathy - per CCM giving FFP, cryo, plts and prbcs UTI - Klebsiella+ urine cx Severe metabolic acidosis - continue bicarb gtt, rx infection Hyperkalemia - appears resolved  Shock liver  AMS - very high NH3 this am     Vinson Moselle MD [facility] 11/15/2022, 10:15 AM  Recent Labs  Lab 11/16/22 1604 11/16/22 1718 11/16/22 2055 11/20/2022 0348  HGB 8.2*  --   --  7.6*  ALBUMIN  --  2.3*  --  2.2*  2.2*  CALCIUM  --  6.8* 6.5* 6.6*  6.5*  PHOS  --  10.2*  --  9.9*  CREATININE  --  4.06* 4.31* 4.79*  4.76*  K  --  4.5 4.4 4.1  4.1   Recent Labs  Lab 11/16/22 0211  IRON 176*  TIBC 136*  FERRITIN >7,500*   Inpatient medications:  sodium chloride   Intravenous Once   sodium chloride   Intravenous Once   sodium chloride   Intravenous Once   Chlorhexidine Gluconate Cloth  6 each Topical Daily   insulin aspart  0-15 Units Subcutaneous Q4H   mouth rinse  15 mL Mouth Rinse Q2H   pantoprazole (PROTONIX) IV  40 mg Intravenous Q12H   sodium zirconium cyclosilicate  10 g Oral Once   vancomycin variable dose per unstable renal function (pharmacist dosing)   Does not apply See admin instructions    sodium chloride Stopped (11/16/22 0834)   lactated ringers     [START ON 11/18/2022] meropenem (MERREM) IV     norepinephrine (LEVOPHED) Adult infusion 6 mcg/min (11/27/2022 0929)   prismasol BGK 2/2.5 dialysis solution Stopped (11/16/22 1345)   prismasol BGK 2/2.5 replacement solution Stopped (11/16/22 1345)   prismasol BGK 2/2.5 replacement solution Stopped (11/16/22 1345)    sodium bicarbonate 150 mEq in dextrose 5 % 1,150 mL infusion 175 mL/hr at 11/16/2022 0851   vasopressin 0.04 Units/min (11/06/2022 0555)   docusate sodium, heparin, mouth rinse, mouth rinse, polyethylene glycol, sodium chloride

## 2022-12-06 NOTE — Progress Notes (Signed)
MD notified of critical lab values: INR >10, platelets 19, fibrinogen <60 and hgb 3.6. Pt received 2 FFP, 1 PRBC and 1 cryoprecipitate prior to MTP.  Massive transfusion protocol initiated at 1002.  Pt received additional 4 PRBC, 2 platelets, and 2 additional FFP during the massive transfusion protocol.  1105 pt bp began to rapidly decline, bp dropping, MD at bedside. Son Dannisha Threadgill contacted  4098 pt went PEA, compressions started, see code sheet.  1120 family at bedside  Blood bank sheets placed in chart.

## 2022-12-06 DEATH — deceased

## 2022-12-29 ENCOUNTER — Encounter (INDEPENDENT_AMBULATORY_CARE_PROVIDER_SITE_OTHER): Payer: Medicare Other | Admitting: Ophthalmology

## 2023-01-11 ENCOUNTER — Other Ambulatory Visit: Payer: Self-pay | Admitting: Internal Medicine

## 2023-02-04 ENCOUNTER — Ambulatory Visit: Payer: Medicare Other | Admitting: Internal Medicine

## 2023-02-06 ENCOUNTER — Ambulatory Visit: Payer: Medicare Other | Admitting: Internal Medicine

## 2023-02-23 ENCOUNTER — Ambulatory Visit: Payer: Medicare Other | Admitting: Podiatry

## 2023-05-26 ENCOUNTER — Ambulatory Visit: Payer: Medicare Other | Admitting: Podiatry
